# Patient Record
Sex: Male | Born: 1935
Health system: Southern US, Community
[De-identification: ages and names within clinical notes are randomized; demographics above are authoritative.]

## PROBLEM LIST (undated history)

## (undated) DIAGNOSIS — E291 Testicular hypofunction: Secondary | ICD-10-CM

## (undated) DIAGNOSIS — N529 Male erectile dysfunction, unspecified: Secondary | ICD-10-CM

## (undated) DIAGNOSIS — IMO0002 Reserved for concepts with insufficient information to code with codable children: Secondary | ICD-10-CM

## (undated) DIAGNOSIS — R9430 Abnormal result of cardiovascular function study, unspecified: Secondary | ICD-10-CM

## (undated) DIAGNOSIS — I2510 Atherosclerotic heart disease of native coronary artery without angina pectoris: Secondary | ICD-10-CM

## (undated) DIAGNOSIS — I739 Peripheral vascular disease, unspecified: Secondary | ICD-10-CM

## (undated) DIAGNOSIS — I10 Essential (primary) hypertension: Secondary | ICD-10-CM

## (undated) DIAGNOSIS — R0602 Shortness of breath: Secondary | ICD-10-CM

## (undated) DIAGNOSIS — J449 Chronic obstructive pulmonary disease, unspecified: Secondary | ICD-10-CM

## (undated) DIAGNOSIS — E785 Hyperlipidemia, unspecified: Secondary | ICD-10-CM

## (undated) DIAGNOSIS — K635 Polyp of colon: Secondary | ICD-10-CM

## (undated) HISTORY — DX: Abnormal result of cardiovascular function study, unspecified: R94.30

## (undated) HISTORY — DX: Testicular hypofunction: E29.1

## (undated) HISTORY — DX: Hyperlipidemia, unspecified: E78.5

## (undated) HISTORY — PX: APPENDECTOMY: SHX54

## (undated) HISTORY — DX: Essential (primary) hypertension: I10

## (undated) HISTORY — PX: TONSILLECTOMY: SUR1361

## (undated) HISTORY — DX: Peripheral vascular disease, unspecified: I73.9

## (undated) HISTORY — DX: Shortness of breath: R06.02

## (undated) HISTORY — DX: Atherosclerotic heart disease of native coronary artery without angina pectoris: I25.10

## (undated) HISTORY — DX: Male erectile dysfunction, unspecified: N52.9

## (undated) HISTORY — DX: Chronic obstructive pulmonary disease, unspecified: J44.9

## (undated) HISTORY — DX: Reserved for concepts with insufficient information to code with codable children: IMO0002

## (undated) HISTORY — DX: Polyp of colon: K63.5

---

## 2001-11-04 ENCOUNTER — Inpatient Hospital Stay (HOSPITAL_COMMUNITY): Admission: AD | Admit: 2001-11-04 | Discharge: 2001-11-07 | Payer: Self-pay | Admitting: Internal Medicine

## 2001-11-04 ENCOUNTER — Encounter: Payer: Self-pay | Admitting: Internal Medicine

## 2001-11-25 ENCOUNTER — Encounter (HOSPITAL_COMMUNITY): Admission: RE | Admit: 2001-11-25 | Discharge: 2002-02-23 | Payer: Self-pay | Admitting: Cardiology

## 2003-08-19 ENCOUNTER — Encounter (INDEPENDENT_AMBULATORY_CARE_PROVIDER_SITE_OTHER): Payer: Self-pay | Admitting: Specialist

## 2003-08-19 ENCOUNTER — Ambulatory Visit (HOSPITAL_COMMUNITY): Admission: RE | Admit: 2003-08-19 | Discharge: 2003-08-19 | Payer: Self-pay | Admitting: Gastroenterology

## 2003-09-27 ENCOUNTER — Encounter: Admission: RE | Admit: 2003-09-27 | Discharge: 2003-09-27 | Payer: Self-pay | Admitting: Internal Medicine

## 2003-12-11 ENCOUNTER — Emergency Department (HOSPITAL_COMMUNITY): Admission: EM | Admit: 2003-12-11 | Discharge: 2003-12-12 | Payer: Self-pay

## 2004-11-22 ENCOUNTER — Emergency Department (HOSPITAL_COMMUNITY): Admission: EM | Admit: 2004-11-22 | Discharge: 2004-11-22 | Payer: Self-pay | Admitting: Emergency Medicine

## 2005-03-31 ENCOUNTER — Emergency Department (HOSPITAL_COMMUNITY): Admission: EM | Admit: 2005-03-31 | Discharge: 2005-03-31 | Payer: Self-pay | Admitting: Emergency Medicine

## 2005-04-16 ENCOUNTER — Encounter: Admission: RE | Admit: 2005-04-16 | Discharge: 2005-04-16 | Payer: Self-pay | Admitting: Internal Medicine

## 2005-06-04 ENCOUNTER — Ambulatory Visit (HOSPITAL_COMMUNITY): Admission: RE | Admit: 2005-06-04 | Discharge: 2005-06-05 | Payer: Self-pay | Admitting: Cardiology

## 2005-12-11 ENCOUNTER — Emergency Department (HOSPITAL_COMMUNITY): Admission: EM | Admit: 2005-12-11 | Discharge: 2005-12-11 | Payer: Self-pay | Admitting: Emergency Medicine

## 2006-10-11 ENCOUNTER — Encounter: Admission: RE | Admit: 2006-10-11 | Discharge: 2006-10-11 | Payer: Self-pay | Admitting: Cardiology

## 2008-06-28 ENCOUNTER — Encounter: Admission: RE | Admit: 2008-06-28 | Discharge: 2008-06-28 | Payer: Self-pay | Admitting: Cardiology

## 2008-07-05 ENCOUNTER — Ambulatory Visit (HOSPITAL_COMMUNITY): Admission: RE | Admit: 2008-07-05 | Discharge: 2008-07-05 | Payer: Self-pay | Admitting: Cardiology

## 2009-09-17 LAB — HM COLONOSCOPY: HM Colonoscopy: NORMAL

## 2009-11-16 LAB — HM COLONOSCOPY

## 2010-03-17 ENCOUNTER — Ambulatory Visit: Payer: Self-pay | Admitting: Internal Medicine

## 2010-03-17 DIAGNOSIS — E1122 Type 2 diabetes mellitus with diabetic chronic kidney disease: Secondary | ICD-10-CM | POA: Insufficient documentation

## 2010-03-17 DIAGNOSIS — E119 Type 2 diabetes mellitus without complications: Secondary | ICD-10-CM | POA: Insufficient documentation

## 2010-03-17 DIAGNOSIS — I10 Essential (primary) hypertension: Secondary | ICD-10-CM | POA: Insufficient documentation

## 2010-03-17 DIAGNOSIS — E785 Hyperlipidemia, unspecified: Secondary | ICD-10-CM | POA: Insufficient documentation

## 2010-03-21 ENCOUNTER — Ambulatory Visit: Payer: Self-pay | Admitting: Internal Medicine

## 2010-03-21 ENCOUNTER — Telehealth (INDEPENDENT_AMBULATORY_CARE_PROVIDER_SITE_OTHER): Payer: Self-pay | Admitting: *Deleted

## 2010-03-23 LAB — CONVERTED CEMR LAB
BUN: 14 mg/dL (ref 6–23)
Basophils Absolute: 0 10*3/uL (ref 0.0–0.1)
Chloride: 106 meq/L (ref 96–112)
Creatinine, Ser: 1.3 mg/dL (ref 0.4–1.5)
Eosinophils Absolute: 0.2 10*3/uL (ref 0.0–0.7)
GFR calc non Af Amer: 73 mL/min (ref >60)
Glucose, Bld: 146 mg/dL — ABNORMAL HIGH (ref 70–99)
HCT: 48 % (ref 39.0–52.0)
Hemoglobin: 16.5 g/dL (ref 13.0–17.0)
MCHC: 34.4 g/dL (ref 30.0–36.0)
Microalb Creat Ratio: 0.9 mg/g (ref 0.0–30.0)
Microalb, Ur: 1.5 mg/dL (ref 0.0–1.9)
Monocytes Absolute: 0.9 10*3/uL (ref 0.1–1.0)
Neutro Abs: 4 10*3/uL (ref 1.4–7.7)
Platelets: 223 10*3/uL (ref 150.0–400.0)
Potassium: 4.1 meq/L (ref 3.5–5.1)
RDW: 14.7 % — ABNORMAL HIGH (ref 11.5–14.6)
Sodium: 140 meq/L (ref 135–145)
WBC: 8.4 10*3/uL (ref 4.5–10.5)

## 2010-04-03 ENCOUNTER — Telehealth (INDEPENDENT_AMBULATORY_CARE_PROVIDER_SITE_OTHER): Payer: Self-pay | Admitting: *Deleted

## 2010-04-08 ENCOUNTER — Emergency Department (HOSPITAL_COMMUNITY): Admission: EM | Admit: 2010-04-08 | Discharge: 2010-04-09 | Payer: Self-pay | Admitting: Emergency Medicine

## 2010-04-10 ENCOUNTER — Telehealth: Payer: Self-pay | Admitting: Family Medicine

## 2010-04-10 DIAGNOSIS — R58 Hemorrhage, not elsewhere classified: Secondary | ICD-10-CM | POA: Insufficient documentation

## 2010-04-12 ENCOUNTER — Encounter: Payer: Self-pay | Admitting: Internal Medicine

## 2010-05-02 ENCOUNTER — Encounter: Payer: Self-pay | Admitting: Internal Medicine

## 2010-05-05 ENCOUNTER — Ambulatory Visit: Payer: Self-pay | Admitting: Internal Medicine

## 2010-05-23 ENCOUNTER — Encounter: Payer: Self-pay | Admitting: Internal Medicine

## 2010-06-21 ENCOUNTER — Ambulatory Visit: Payer: Self-pay | Admitting: Internal Medicine

## 2010-06-21 DIAGNOSIS — E291 Testicular hypofunction: Secondary | ICD-10-CM | POA: Insufficient documentation

## 2010-06-22 ENCOUNTER — Ambulatory Visit: Payer: Self-pay | Admitting: Internal Medicine

## 2010-06-29 LAB — CONVERTED CEMR LAB
ALT: 27 units/L (ref 0–53)
AST: 24 units/L (ref 0–37)
Cholesterol: 142 mg/dL (ref 0–200)
HDL: 40.1 mg/dL (ref >39.00)
Hgb A1c MFr Bld: 7.2 % — ABNORMAL HIGH (ref 4.6–6.5)
LDL Cholesterol: 82 mg/dL (ref 0–99)
TSH: 1.81 microintl units/mL (ref 0.35–5.50)
Total CHOL/HDL Ratio: 4
VLDL: 19.8 mg/dL (ref 0.0–40.0)

## 2010-06-30 ENCOUNTER — Ambulatory Visit: Payer: Self-pay | Admitting: Internal Medicine

## 2010-07-03 ENCOUNTER — Telehealth: Payer: Self-pay | Admitting: Internal Medicine

## 2010-08-02 ENCOUNTER — Telehealth: Payer: Self-pay | Admitting: Internal Medicine

## 2010-08-16 ENCOUNTER — Encounter: Payer: Self-pay | Admitting: Internal Medicine

## 2010-08-23 ENCOUNTER — Telehealth: Payer: Self-pay | Admitting: Internal Medicine

## 2010-09-21 ENCOUNTER — Other Ambulatory Visit: Payer: Self-pay | Admitting: Internal Medicine

## 2010-09-21 ENCOUNTER — Ambulatory Visit
Admission: RE | Admit: 2010-09-21 | Discharge: 2010-09-21 | Payer: Self-pay | Source: Home / Self Care | Attending: Internal Medicine | Admitting: Internal Medicine

## 2010-09-21 ENCOUNTER — Encounter: Payer: Self-pay | Admitting: Internal Medicine

## 2010-09-21 DIAGNOSIS — J069 Acute upper respiratory infection, unspecified: Secondary | ICD-10-CM | POA: Insufficient documentation

## 2010-09-21 LAB — BASIC METABOLIC PANEL
BUN: 18 mg/dL (ref 6–23)
CO2: 25 mEq/L (ref 19–32)
Calcium: 8.8 mg/dL (ref 8.4–10.5)
Chloride: 103 mEq/L (ref 96–112)
Creatinine, Ser: 1.5 mg/dL (ref 0.4–1.5)
GFR: 61.42 mL/min (ref >60.00)
Glucose, Bld: 129 mg/dL — ABNORMAL HIGH (ref 70–99)
Potassium: 3.5 mEq/L (ref 3.5–5.1)
Sodium: 138 mEq/L (ref 135–145)

## 2010-09-21 LAB — ALT: ALT: 26 U/L (ref 0–53)

## 2010-09-21 LAB — AST: AST: 20 U/L (ref 0–37)

## 2010-09-22 ENCOUNTER — Telehealth: Payer: Self-pay | Admitting: Internal Medicine

## 2010-09-25 ENCOUNTER — Telehealth: Payer: Self-pay | Admitting: Internal Medicine

## 2010-10-04 ENCOUNTER — Ambulatory Visit
Admission: RE | Admit: 2010-10-04 | Discharge: 2010-10-04 | Payer: Self-pay | Source: Home / Self Care | Attending: Internal Medicine | Admitting: Internal Medicine

## 2010-10-04 ENCOUNTER — Other Ambulatory Visit: Payer: Self-pay | Admitting: Internal Medicine

## 2010-10-04 LAB — HEMOGLOBIN A1C: Hgb A1c MFr Bld: 7.1 % — ABNORMAL HIGH (ref 4.6–6.5)

## 2010-10-06 ENCOUNTER — Telehealth (INDEPENDENT_AMBULATORY_CARE_PROVIDER_SITE_OTHER): Payer: Self-pay | Admitting: *Deleted

## 2010-10-06 ENCOUNTER — Ambulatory Visit
Admission: RE | Admit: 2010-10-06 | Discharge: 2010-10-06 | Payer: Self-pay | Source: Home / Self Care | Attending: Internal Medicine | Admitting: Internal Medicine

## 2010-10-06 DIAGNOSIS — R51 Headache: Secondary | ICD-10-CM | POA: Insufficient documentation

## 2010-10-06 DIAGNOSIS — M546 Pain in thoracic spine: Secondary | ICD-10-CM | POA: Insufficient documentation

## 2010-10-17 NOTE — Consult Note (Signed)
Summary: neg w/u for gross hematuria---- [facility]  [facility]   Imported By: Lanelle Bal 06/01/2010 10:34:16  _____________________________________________________________________  External Attachment:    Type:   Image     Comment:   External Document

## 2010-10-17 NOTE — Progress Notes (Signed)
Summary: Med question--Metformin  Phone Note Call from Patient Call back at Home Phone [phone]   Summary of Call: Patient called to verify that his Metformin should be 1000 mg and to make [place] aware that vit d prescription needs to go to mail order. Meds were discussed with patient and he will continue as planned. Initial call taken by: Lucious Groves CMA,  July 03, 2010 12:28 PM    Prescriptions: VITAMIN D (ERGOCALCIFEROL) 50000 UNIT CAPS (ERGOCALCIFEROL) 1 by mouth every mon and thurs  #24 x 3   Entered by:   Lucious Groves CMA   Authorized by:   Nolon Rod. Paz MD   Signed by:   Lucious Groves CMA on 07/03/2010   Method used:   Faxed to ...       [facility])       [address] [address]       [place], [place]  [address]       Ph: [phone]       Fax: [phone]   RxID:   [id number] METFORMIN HCL 1000 MG TABS (METFORMIN HCL) 1 by mouth two times a day.  #180 x 2   Entered by:   Lucious Groves CMA   Authorized by:   Nolon Rod. Paz MD   Signed by:   Lucious Groves CMA on 07/03/2010   Method used:   Faxed to ...       [facility])       [address] [address]       [place], [place]  [address]       Ph: [phone]       Fax: [phone]   RxID:   [id number]

## 2010-10-17 NOTE — Letter (Signed)
Summary: Records Dated 10-09-07 thru 10-10-09/[facility]  Records Dated 10-09-07 thru 10-10-09/[facility]   Imported By: Lanelle Bal 03/23/2010 09:31:54  _____________________________________________________________________  External Attachment:    Type:   Image     Comment:   External Document  Appended Document: Records Dated 10-09-07 thru 10-10-09/[facility] scanned for future reference

## 2010-10-17 NOTE — Progress Notes (Signed)
Summary: Referral  Phone Note Call from Patient Call back at Home Phone [phone]   Caller: Patient Summary of Call: Patient went to the ER this weekend bc he was bleeding from his penis. They did not admit him but said he should call us for a referral to an urologist. Initial call taken by: Harold Barban,  April 10, 2010 9:22 AM  Follow-up for Phone Call        ok for referral. Follow-up by: Neena Rhymes MD,  April 10, 2010 9:24 AM  Additional Follow-up for Phone Call Additional follow up Details #1::        Will send information to [facility].  Additional Follow-up by: Harold Barban,  April 10, 2010 9:42 AM  New Problems: UNSPECIFIED HEMORRHAGE (ICD-459.0)   New Problems: UNSPECIFIED HEMORRHAGE (ICD-459.0)

## 2010-10-17 NOTE — Miscellaneous (Signed)
Summary: Flu/[facility]  Flu/[facility]   Imported By: Lanelle Bal 05/18/2010 10:59:47  _____________________________________________________________________  External Attachment:    Type:   Image     Comment:   External Document

## 2010-10-17 NOTE — Progress Notes (Signed)
Summary: three 90 day prescriptions  Phone Note Refill Request Call back at Home Phone [phone] Message from:  Patient on August 02, 2010 9:36 AM  Refills Requested: Medication #1:  COZAAR 100 MG TABS 1 by mouth daily  Medication #2:  NORVASC 10 MG TABS 1 by mouth once daily  Medication #3:  JANUVIA 100 MG TABS 1 by mouth daily. needs 90 day prescriptions plus refills from [facility]  Initial call taken by: Jerolyn Shin,  August 02, 2010 9:39 AM  Follow-up for Phone Call        Patient notified. Follow-up by: Lucious Groves CMA,  August 02, 2010 10:20 AM    Prescriptions: JANUVIA 100 MG TABS (SITAGLIPTIN PHOSPHATE) 1 by mouth daily.  #90 x 2   Entered by:   Lucious Groves CMA   Authorized by:   Nolon Rod. Paz MD   Signed by:   Lucious Groves CMA on 08/02/2010   Method used:   Faxed to ...       [facility] (mail-order)             , [place]         Ph:        Fax: [phone]   RxID:   [id number] NORVASC 10 MG TABS (AMLODIPINE BESYLATE) 1 by mouth once daily  #90 x 2   Entered by:   Lucious Groves CMA   Authorized by:   Nolon Rod. Paz MD   Signed by:   Lucious Groves CMA on 08/02/2010   Method used:   Faxed to ...       [facility] (mail-order)             , [place]         Ph:        Fax: [phone]   RxID:   [id number] COZAAR 100 MG TABS (LOSARTAN POTASSIUM) 1 by mouth daily  #90 x 2   Entered by:   Lucious Groves CMA   Authorized by:   Nolon Rod. Paz MD   Signed by:   Lucious Groves CMA on 08/02/2010   Method used:   Faxed to ...       [facility] (mail-order)             , [place]         Ph:        Fax: [phone]   RxID:   [id number]

## 2010-10-17 NOTE — Progress Notes (Signed)
Summary: refill  Phone Note Refill Request   Refills Requested: Medication #1:  METOPROLOL SUCCINATE 100 MG XR24H-TAB 1 by mouth daily  Medication #2:  METFORMIN HCL 500 MG TABS 1 by mouth once daily  Medication #3:  TRIAMTERENE-HCTZ 37.5-25 MG TABS 1 by mouth once daily  Medication #4:  ZOCOR 20 MG TABS 1 by mouth daily patient [facility]    Initial call taken by: Okey Regal [place],  March 21, 2010 8:48 AM    Prescriptions: ZOCOR 20 MG TABS (SIMVASTATIN) 1 by mouth daily  #90 x 0   Entered by:   Army Fossa CMA   Authorized by:   Nolon Rod. Paz MD   Signed by:   Army Fossa CMA on 03/21/2010   Method used:   Faxed to ...       [facility] education [facility])       [address] [address]       [place], [place]  [address]       Ph: [phone]       Fax: [phone]   RxID:   [id number] TRIAMTERENE-HCTZ 37.5-25 MG TABS (TRIAMTERENE-HCTZ) 1 by mouth once daily  #90 x 0   Entered by:   Army Fossa CMA   Authorized by:   Nolon Rod. Paz MD   Signed by:   Army Fossa CMA on 03/21/2010   Method used:   Faxed to ...       [facility] education [facility])       [address] [address]       [place], [place]  [address]       Ph: [phone]       Fax: [phone]   RxID:   [id number] METFORMIN HCL 500 MG TABS (METFORMIN HCL) 1 by mouth once daily  #90 x 0   Entered by:   Army Fossa CMA   Authorized by:   Nolon Rod. Paz MD   Signed by:   Army Fossa CMA on 03/21/2010   Method used:   Faxed to ...       [facility])       [address] [address]       [place], [place]  [address]       Ph: [phone]       Fax: [phone]   RxID:   [id number] METOPROLOL SUCCINATE 100 MG XR24H-TAB (METOPROLOL SUCCINATE) 1 by mouth daily  #90 x 0   Entered by:   Army Fossa CMA   Authorized by:   Nolon Rod. Paz MD   Signed by:   Army Fossa CMA on 03/21/2010   Method used:   Faxed to ...       [facility])       [address] [address]       [place], [place]   [address]       Ph: [phone]       Fax: [phone]   RxID:   [id number]

## 2010-10-17 NOTE — Progress Notes (Signed)
Summary: RX  Phone Note Refill Request Call back at [phone] Message from:  Patient  Refills Requested: Medication #1:  ANDROGEL PUMP 1.25 GM/ACT (1%) GEL Rx per cards.   Dosage confirmed as above?Dosage Confirmed   Supply Requested: 1 month PLEASE FAX TO [facility]   Initial call taken by: Freddy Jaksch,  August 23, 2010 11:38 AM  Follow-up for Phone Call        is cards still filling this for pt? Follow-up by: Army Fossa CMA,  August 23, 2010 11:45 AM  Additional Follow-up for Phone Call Additional follow up Details #1::        needs to discuss with the prescribing doctor Additional Follow-up by: [facility] E. Paz MD,  August 23, 2010 3:17 PM    Additional Follow-up for Phone Call Additional follow up Details #2::    Pts wife is aware. Army Fossa CMA  August 23, 2010 3:44 PM    Appended Document: neg diabetic retinopathy--- [facility] eneter eye exam on EMR

## 2010-10-17 NOTE — Assessment & Plan Note (Signed)
Summary: reaction to flu shot?//lch   Vital Signs:  Patient profile:   75 year old male Weight:      247 pounds Temp:     97.0 degrees F oral Pulse rate:   80 / minute Pulse rhythm:   regular BP sitting:   130 / 84  (left arm) Cuff size:   large  Vitals Entered By: Army Fossa CMA (May 05, 2010 12:56 PM) CC: Possible Flu Shot Reaction Comments - Coughing - Head congestion - Had flu shot tuesday   History of Present Illness: patient started with runny nose, dry cough but no fever 6 days ago 3 days ago he went to the pharmachy  to get over-the-counter medication, the pharmacist  suggested a flu shot The next day he felt although worse, more cough, body aches, anterior chest pain with cough, sneezing. Today he feels overall better.  ROS Denies fevers No rash No nausea, vomiting, diarrhea No facial or lip swelling  Current Medications (verified): 1)  Cozaar 100 Mg Tabs (Losartan Potassium) .Marland Kitchen.. 1 By Mouth Daily 2)  Vitamin D (Ergocalciferol) 50000 Unit Caps (Ergocalciferol) .Marland Kitchen.. 1 By Mouth Every Mon and Thurs 3)  Metoprolol Succinate 100 Mg Xr24h-Tab (Metoprolol Succinate) .Marland Kitchen.. 1 By Mouth Daily 4)  Metformin Hcl 850 Mg Tabs (Metformin Hcl) .Marland Kitchen.. 1 By Mouth Two Times A Day 5)  Norvasc 10 Mg Tabs (Amlodipine Besylate) .Marland Kitchen.. 1 By Mouth Once Daily 6)  Triamterene-Hctz 37.5-25 Mg Tabs (Triamterene-Hctz) .Marland Kitchen.. 1 By Mouth Once Daily 7)  Zocor 20 Mg Tabs (Simvastatin) .Marland Kitchen.. 1 By Mouth Daily 8)  Plavix 75 Mg Tabs (Clopidogrel Bisulfate) .Marland Kitchen.. 1 By Mouth Once Daily 9)  Aspirin 81 Mg Tbec (Aspirin) .Marland Kitchen.. 1 A Day 10)  Januvia 100 Mg Tabs (Sitagliptin Phosphate) .Marland Kitchen.. 1 By Mouth Daily.  Allergies (verified): No Known Drug Allergies  Past History:  Past Medical History: Reviewed history from 03/17/2010 and no changes required. Vascular, heart doctor was Dr Jacinto Halim, now is Dr Lynnea Ferrier  CAD  s/p stents 2006 PVD -- s/p stents at LE 2009 Polyps in colon, s/p several Cscopes , last 2011  w/ Dr Loreta Ave  Diabetes mellitus-- dx aprox 2009 Hyperlipidemia--dx in the 90s Hypertension-- dx in the 90     Past Surgical History: Reviewed history from 03/17/2010 and no changes required. Appendectomy Tonsillectomy  Physical Exam  General:  alert and well-developed.  no apparent distress Head:  face symmetric Nose:  note congested Mouth:  no redness or discharge Lungs:  normal respiratory effort, no intercostal retractions, no accessory muscle use, and normal breath sounds.   Heart:  normal rate, regular rhythm, and no murmur.   Extremities:  no edema   Impression & Recommendations:  Problem # 1:  ? of UPPER RESPIRATORY INFECTION, VIRAL (ICD-465.9) unclear if symptoms related to viral infection that is running its course or if his symptoms were exacerbated by the flu shot. He does not have clear-cut symptoms related to an allergic reaction Furthermore, he has taken flu shots for many years without problems Plan: Rest, fluids, Tylenol. See instructions Okay to take his flu shot next year His updated medication list for this problem includes:    Aspirin 81 Mg Tbec (Aspirin) .Marland Kitchen... 1 a day  Complete Medication List: 1)  Cozaar 100 Mg Tabs (Losartan potassium) .Marland Kitchen.. 1 by mouth daily 2)  Vitamin D (ergocalciferol) 50000 Unit Caps (Ergocalciferol) .Marland Kitchen.. 1 by mouth every mon and thurs 3)  Metoprolol Succinate 100 Mg Xr24h-tab (Metoprolol succinate) .Marland Kitchen.. 1 by mouth  daily 4)  Metformin Hcl 850 Mg Tabs (Metformin hcl) .Marland Kitchen.. 1 by mouth two times a day 5)  Norvasc 10 Mg Tabs (Amlodipine besylate) .Marland Kitchen.. 1 by mouth once daily 6)  Triamterene-hctz 37.5-25 Mg Tabs (Triamterene-hctz) .Marland Kitchen.. 1 by mouth once daily 7)  Zocor 20 Mg Tabs (Simvastatin) .Marland Kitchen.. 1 by mouth daily 8)  Plavix 75 Mg Tabs (Clopidogrel bisulfate) .Marland Kitchen.. 1 by mouth once daily 9)  Aspirin 81 Mg Tbec (Aspirin) .Marland Kitchen.. 1 a day 10)  Januvia 100 Mg Tabs (Sitagliptin phosphate) .Marland Kitchen.. 1 by mouth daily.  Patient Instructions: 1)  Get plenty  of rest, drink lots of clear liquids, and use Tylenol 2)  robitussin DM as needed for cough 3)  call if no better in few days, call any time if you get worse    Immunization History:  Influenza Immunization History:    Influenza:  historical (05/02/2010)

## 2010-10-17 NOTE — Assessment & Plan Note (Signed)
Summary: new to [email or web address]   Vital Signs:  Patient profile:   75 year old male Height:      74 inches Weight:      254 pounds BMI:     32.73 Pulse rate:   80 / minute Pulse rhythm:   regular BP sitting:   138 / 84  (left arm) Cuff size:   large  Vitals Entered By: Army Fossa CMA (March 17, 2010 1:24 PM) CC: Pt here to establish   History of Present Illness: new patient Here to establish, feels well  used see Dr Renae Gloss   Heart Disease -- last OV w/  cards 5-11, next visit 6-11 Diabetes-- ambulatory CBGs usually in the 116 Hyperlipidemia-- good medication compliance  Hypertension-- no recent ambulatory BPs but usually in the 130s/80s  Preventive Screening-Counseling & Management  Alcohol-Tobacco     Alcohol drinks/day: <1     Smoking Status: quit  Caffeine-Diet-Exercise     Does Patient Exercise: yes     Type of exercise: treadmill     Times/week: 3      Drug Use:  no.    Allergies (verified): No Known Drug Allergies  Past History:  Family History: Last updated: 03/17/2010 Heart Disease  Family History Diabetes 1st degree relative Family History High cholesterol Family History Hypertension Family History of Stroke M 1st degree relative <50  Social History: Last updated: 03/17/2010 Married children, 4 and 2 step sons Retired, he  preaches  Former Smoker, quit 1987 (used to smoke 2ppd) Alcohol use-yes-socially  Drug use-no Regular exercise-yes  Risk Factors: Alcohol Use: <1 (03/17/2010) Exercise: yes (03/17/2010)  Risk Factors: Smoking Status: quit (03/17/2010)  Past Medical History: Vascular, heart doctor was Dr Jacinto Halim, now is Dr Lynnea Ferrier  CAD  s/p stents 2006 PVD -- s/p stents at LE 2009 Polyps in colon, s/p several Cscopes , last 2011 w/ Dr Loreta Ave  Diabetes mellitus-- dx aprox 2009 Hyperlipidemia--dx in the 90s Hypertension-- dx in the 90     Past Surgical History: Appendectomy Tonsillectomy  Family History: Heart Disease  Family  History Diabetes 1st degree relative Family History High cholesterol Family History Hypertension Family History of Stroke M 1st degree relative <50  Social History: Married children, 4 and 2 step sons Retired, he  preaches  Former Smoker, quit 1987 (used to smoke 2ppd) Alcohol use-yes-socially  Drug use-no Regular exercise-yes Smoking Status:  quit Drug Use:  no Does Patient Exercise:  yes  Review of Systems General:  diet-- try to eat healthy, not always succesful  exercise-- trying to walk most weeks . CV:  Denies chest pain or discomfort and swelling of feet; no  claudication . Resp:  Denies cough; SOB at baseline. GI:  Denies bloody stools, nausea, and vomiting. GU:  Denies dysuria, hematuria, nocturia, and urinary hesitancy.  Physical Exam  General:  alert, well-developed, and well-nourished.   Neck:  no masses and normal carotid upstroke.   Lungs:  normal respiratory effort, no intercostal retractions, no accessory muscle use, and normal breath sounds.   Heart:  normal rate, regular rhythm, no murmur, and no gallop.   Abdomen:  soft, non-tender, no distention, no masses, no guarding, and no rigidity.   Extremities:  no pretibial edema bilaterally  Psych:  Cognition and judgment appear intact. Alert and cooperative with normal attention span and concentration.     Impression & Recommendations:  Problem # 1:  DIABETES MELLITUS, TYPE I (ICD-250.01) discussed with patient diet and exercise, labs His updated medication list  for this problem includes:    Cozaar 100 Mg Tabs (Losartan potassium) .Marland Kitchen... 1 by mouth daily    Metformin Hcl 500 Mg Tabs (Metformin hcl) .Marland Kitchen... 1 by mouth once daily    Aspirin 81 Mg Tbec (Aspirin) .Marland Kitchen... 1 a day  Orders: Venipuncture ([address]) TLB-A1C / Hgb A1C (Glycohemoglobin) (83036-A1C) TLB-Microalbumin/Creat Ratio, Urine (82043-MALB)  Problem # 2:  HYPERTENSION (ICD-401.9) seems to be well controlled labs  His updated medication list for  this problem includes:    Cozaar 100 Mg Tabs (Losartan potassium) .Marland Kitchen... 1 by mouth daily    Metoprolol Succinate 100 Mg Xr24h-tab (Metoprolol succinate) .Marland Kitchen... 1 by mouth daily    Norvasc 10 Mg Tabs (Amlodipine besylate) .Marland Kitchen... 1 by mouth once daily    Triamterene-hctz 37.5-25 Mg Tabs (Triamterene-hctz) .Marland Kitchen... 1 by mouth once daily  BP today: 138/84  Orders: TLB-BMP (Basic Metabolic Panel-BMET) (80048-METABOL) TLB-CBC Platelet - w/Differential (85025-CBCD)  Problem # 3:  PERIPHERAL VASCULAR DISEASE (ICD-443.9) chart reviewed, procedure in October 2009, see below. He seems to be stable. Followup by cardiology     PROCEDURE PERFORMED:  Abdominal aortogram.  Percutaneous transluminal angioplasty and balloon angioplasty of the left superficial femoral artery.  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      His updated medication list for this problem includes:    Plavix 75 Mg Tabs (Clopidogrel bisulfate) .Marland Kitchen... 1 by mouth once daily  Problem # 4:  CORONARY ARTERY DISEASE (ICD-414.00) saw  cardiology recently, was told to come back in one year seems stable His updated medication list for this problem includes:    Cozaar 100 Mg Tabs (Losartan potassium) .Marland Kitchen... 1 by mouth daily    Metoprolol Succinate 100 Mg Xr24h-tab (Metoprolol succinate) .Marland Kitchen... 1 by mouth daily    Norvasc 10 Mg Tabs (Amlodipine besylate) .Marland Kitchen... 1 by mouth once daily    Triamterene-hctz 37.5-25 Mg Tabs (Triamterene-hctz) .Marland Kitchen... 1 by mouth once daily    Plavix 75 Mg Tabs (Clopidogrel bisulfate) .Marland Kitchen... 1 by mouth once daily    Aspirin 81 Mg Tbec (Aspirin) .Marland Kitchen... 1 a day  Problem # 5:  HYPERLIPIDEMIA (ICD-272.4)  His updated medication list for this problem includes:    Zocor 20 Mg Tabs (Simvastatin) .Marland Kitchen... 1 by mouth daily  Complete Medication List: 1)  Cozaar 100 Mg Tabs (Losartan potassium) .Marland Kitchen.. 1 by mouth daily 2)   Vitamin D (ergocalciferol) 50000 Unit Caps (Ergocalciferol) .Marland Kitchen.. 1 by mouth every mon and thurs 3)  Metoprolol Succinate 100 Mg Xr24h-tab (Metoprolol succinate) .Marland Kitchen.. 1 by mouth daily 4)  Metformin Hcl 500 Mg Tabs (Metformin hcl) .Marland Kitchen.. 1 by mouth once daily 5)  Norvasc 10 Mg Tabs (Amlodipine besylate) .Marland Kitchen.. 1 by mouth once daily 6)  Triamterene-hctz 37.5-25 Mg Tabs (Triamterene-hctz) .Marland Kitchen.. 1 by mouth once daily 7)  Zocor 20 Mg Tabs (Simvastatin) .Marland Kitchen.. 1 by mouth daily 8)  Plavix 75 Mg Tabs (Clopidogrel bisulfate) .Marland Kitchen.. 1 by mouth once daily 9)  Aspirin 81 Mg Tbec (Aspirin) .Marland Kitchen.. 1 a day  Patient Instructions: 1)  Please schedule a follow-up appointment in 3 months .  2)  please get records from Dr. Loreta Ave 3)  Please get records from Dr. Lynnea Ferrier   Risk Factors:  Tobacco use:  quit Drug use:  no Alcohol use:  yes    Drinks per day:  <1 Exercise:  yes    Times per week:  3    Type:  treadmill  Colonoscopy  History:     Date of Last Colonoscopy:  09/17/2009    Results:  normal     Preventive Care Screening  Colonoscopy:    Date:  09/17/2009    Results:  normal   Last Tetanus Booster:    Date:  09/17/2004    Results:  Historical

## 2010-10-17 NOTE — Assessment & Plan Note (Signed)
Summary: CPX.///SPH   Vital Signs:  Patient profile:   75 year old male Height:      74 inches Weight:      255.13 pounds Pulse rate:   72 / minute Pulse rhythm:   regular BP sitting:   122 / 82  (left arm) Cuff size:   large  Vitals Entered By: Army Fossa CMA (June 21, 2010 2:28 PM) CC: CPX, not fasting Comments Pharm- [facility] Had flu shot @ [facility]   History of Present Illness: Here for Medicare AWV: 1.   Risk factors based on Past M, S, F history:reviewed 2.   Physical Activities: active, no routine exercise , occasionally does walk in the treadmil 3.   Depression/mood: denies problems, no problems noted today  4.   Hearing: no problems noted today, denies issues himself  5.   ADL's: totally independent  6.   Fall Risk: low risk, no h/o falls 7.   Home Safety: does feelsafe at home  8.   Height, weight, &visual acuity: see VS, vision corrected w/ glasses  9.   Counseling: yes, see below 10.   Labs ordered based on risk factors: yes 11.           Referral Coordination, if needed 12.           Care Plan, see assessment and plan 13.            Cognitive Assessment, motor skills, memory and cognition seem appropriate  additionally, we discussed the following issues  CAD -- saw cards last 6 months ago, next visit?  Diabetes mellitus-- ambulatory CBGs in the 109 and always < 120   Hyperlipidemia-- good medication compliance   Hypertension-- good medication compliance , no ambulatory BPs   reports he uses tetosterone as Rx by cardiology   Preventive Screening-Counseling & Management  Caffeine-Diet-Exercise     Does Patient Exercise: no  Current Medications (verified): 1)  Cozaar 100 Mg Tabs (Losartan Potassium) .Marland Kitchen.. 1 By Mouth Daily 2)  Vitamin D (Ergocalciferol) 50000 Unit Caps (Ergocalciferol) .Marland Kitchen.. 1 By Mouth Every Mon and Thurs 3)  Metoprolol Succinate 100 Mg Xr24h-Tab (Metoprolol Succinate) .Marland Kitchen.. 1 By Mouth Daily 4)  Metformin Hcl 850 Mg Tabs  (Metformin Hcl) .Marland Kitchen.. 1 By Mouth Two Times A Day 5)  Norvasc 10 Mg Tabs (Amlodipine Besylate) .Marland Kitchen.. 1 By Mouth Once Daily 6)  Triamterene-Hctz 37.5-25 Mg Tabs (Triamterene-Hctz) .Marland Kitchen.. 1 By Mouth Once Daily 7)  Zocor 20 Mg Tabs (Simvastatin) .Marland Kitchen.. 1 By Mouth Daily 8)  Plavix 75 Mg Tabs (Clopidogrel Bisulfate) .Marland Kitchen.. 1 By Mouth Once Daily 9)  Aspirin 81 Mg Tbec (Aspirin) .Marland Kitchen.. 1 A Day 10)  Januvia 100 Mg Tabs (Sitagliptin Phosphate) .Marland Kitchen.. 1 By Mouth Daily.  Allergies (verified): No Known Drug Allergies  Past History:  Past Medical History: Vascular, heart doctor was Dr Jacinto Halim, now is Dr Lynnea Ferrier  CAD  s/p stents 2006 PVD -- s/p stents at LE 2009 Polyps in colon, s/p several Cscopes , last 2011 w/ Dr Loreta Ave  Diabetes mellitus-- dx aprox 2009 Hyperlipidemia--dx in the 90s Hypertension-- dx in the 90  ED -- has use a vacumm device on testosterone Rx by Cardiology per patient   Past Surgical History: Reviewed history from 03/17/2010 and no changes required. Appendectomy Tonsillectomy  Family History: Heart Disease -- F  Diabetes --  GM, nephews, many family members  High cholesterol-- ? Hypertension-- F  stroke -- F  colon ca--no prostate ca-- no  Social History: Married children, 4 and 2 step sons Retired, he  preaches  Former Smoker, quit 1987 (used to smoke 2ppd) Alcohol use-yes-socially  Drug use-no Regular exercise-- no but active  Does Patient Exercise:  no  Review of Systems General:  Denies fatigue and fever. CV:  Denies chest pain or discomfort and swelling of feet. Resp:  Denies cough and shortness of breath. GI:  Denies bloody stools, nausea, and vomiting. GU:  Denies dysuria, urinary frequency, and urinary hesitancy. Neuro:  Denies numbness; no HAs.  Physical Exam  General:  alert, well-developed, and overweight-appearing.   Neck:  no masses, no thyromegaly, and normal carotid upstroke.   Lungs:  normal respiratory effort, no intercostal retractions, no  accessory muscle use, and normal breath sounds.   Heart:  normal rate, regular rhythm, and no murmur.   Abdomen:  soft, non-tender, no distention, no masses, no guarding, and no rigidity.  no bruit Prostate:  performed a urology July 2011 Pulses:  normal bilateral pedal pulses Extremities:  no edema  Diabetes Management Exam:    Foot Exam (with socks and/or shoes not present):       Sensory-Pinprick/Light touch:          Left medial foot (L-4): normal          Left dorsal foot (L-5): normal          Left lateral foot (S-1): normal          Right medial foot (L-4): normal          Right dorsal foot (L-5): normal          Right lateral foot (S-1): normal       Sensory-Monofilament:          Left foot: diminished          Right foot: diminished       Sensory-other: very mild decrease sensory at the tip  of the fourth toe bilaterally       Inspection:          Left foot: normal          Right foot: normal       Nails:          Left foot: thickened          Right foot: thickened   Impression & Recommendations:  Problem # 1:  ROUTINE GENERAL MEDICAL EXAM@HEALTH  CARE FACL (ICD-V70.0)  Td 06 Pneumonia shot , had one before many years ago, no documentation ----> pneumonia shot provided today Had a flu shot already printed material provided regards shingles immunization  Colonoscopy-- h/o polyps in colon, s/p several Cscopes , last 2011 w/ Dr Loreta Ave  (per patient)  DRE done @ urology recently Check PSA discussed diet and exercise  Orders: Medicare -1st Annual Wellness Visit [phone])  Problem # 2:  HYPOGONADISM (ICD-257.2) on AndroGel according to the patient. Problem managed by cardiology  Problem # 3:  CORONARY ARTERY DISEASE (ICD-414.00) asymptomatic, saw  cardiology 6 months ago, recommend to call them and find out when his  next appointment is supposed to be His updated medication list for this problem includes:    Aspirin 81 Mg Tbec (Aspirin) .Marland Kitchen... 1 a day    Cozaar 100  Mg Tabs (Losartan potassium) .Marland Kitchen... 1 by mouth daily    Metoprolol Succinate 100 Mg Xr24h-tab (Metoprolol succinate) .Marland Kitchen... 1 by mouth daily    Norvasc 10 Mg Tabs (Amlodipine besylate) .Marland Kitchen... 1 by mouth once daily    Triamterene-hctz  37.5-25 Mg Tabs (Triamterene-hctz) .Marland Kitchen... 1 by mouth once daily    Plavix 75 Mg Tabs (Clopidogrel bisulfate) .Marland Kitchen... 1 by mouth once daily  Problem # 4:  HYPERLIPIDEMIA (ICD-272.4) labs His updated medication list for this problem includes:    Zocor 20 Mg Tabs (Simvastatin) .Marland Kitchen... 1 by mouth daily  Problem # 5:  HYPERTENSION (ICD-401.9) at goal  His updated medication list for this problem includes:    Cozaar 100 Mg Tabs (Losartan potassium) .Marland Kitchen... 1 by mouth daily    Metoprolol Succinate 100 Mg Xr24h-tab (Metoprolol succinate) .Marland Kitchen... 1 by mouth daily    Norvasc 10 Mg Tabs (Amlodipine besylate) .Marland Kitchen... 1 by mouth once daily    Triamterene-hctz 37.5-25 Mg Tabs (Triamterene-hctz) .Marland Kitchen... 1 by mouth once daily  BP today: 122/82 Prior BP: 130/84 (05/05/2010)  Labs Reviewed: K+: 4.1 (03/21/2010) Creat: : 1.3 (03/21/2010)     Problem # 6:  DIABETES MELLITUS, TYPE I (ICD-250.01) we increased his medication based on the last A1c----- labs He does have mild peripheral neuropathy,information provider regards feet care Also recommend to see an eye doctor routinely His updated medication list for this problem includes:    Aspirin 81 Mg Tbec (Aspirin) .Marland Kitchen... 1 a day    Cozaar 100 Mg Tabs (Losartan potassium) .Marland Kitchen... 1 by mouth daily    Metformin Hcl 850 Mg Tabs (Metformin hcl) .Marland Kitchen... 1 by mouth two times a day    Januvia 100 Mg Tabs (Sitagliptin phosphate) .Marland Kitchen... 1 by mouth daily.  Labs Reviewed: Creat: 1.3 (03/21/2010)    Reviewed HgBA1c results: 7.8 (03/21/2010)  Complete Medication List: 1)  Aspirin 81 Mg Tbec (Aspirin) .Marland Kitchen.. 1 a day 2)  Vitamin D (ergocalciferol) 50000 Unit Caps (Ergocalciferol) .Marland Kitchen.. 1 by mouth every mon and thurs 3)  Cozaar 100 Mg Tabs (Losartan  potassium) .Marland Kitchen.. 1 by mouth daily 4)  Metoprolol Succinate 100 Mg Xr24h-tab (Metoprolol succinate) .Marland Kitchen.. 1 by mouth daily 5)  Norvasc 10 Mg Tabs (Amlodipine besylate) .Marland Kitchen.. 1 by mouth once daily 6)  Metformin Hcl 850 Mg Tabs (Metformin hcl) .Marland Kitchen.. 1 by mouth two times a day 7)  Triamterene-hctz 37.5-25 Mg Tabs (Triamterene-hctz) .Marland Kitchen.. 1 by mouth once daily 8)  Zocor 20 Mg Tabs (Simvastatin) .Marland Kitchen.. 1 by mouth daily 9)  Plavix 75 Mg Tabs (Clopidogrel bisulfate) .Marland Kitchen.. 1 by mouth once daily 10)  Januvia 100 Mg Tabs (Sitagliptin phosphate) .Marland Kitchen.. 1 by mouth daily. 11)  Androgel Pump 1.25 Gm/act (1%) Gel (Testosterone) .... Rx per cards  Other Orders: Pneumococcal Vaccine (45409) Admin 1st Vaccine (81191)  Patient Instructions: 1)  please come back fasting 2)  FLP TSH-----dx  high cholesterol 3)  Hemoglobin A1c, AST, ALT----dx   diabetes 4)  PSA---- dx  screening for prostate cancer 5)  BMP----dx hypertension 6)  Please schedule a follow-up appointment in 4 to 5  months .    Immunizations Administered:  Pneumonia Vaccine:    Vaccine Type: Pneumovax    Site: left deltoid    Mfr: Merck    Dose: 0.5 ml    Route: IM    Given by: Army Fossa CMA    Exp. Date: 11/30/2011    Lot #: [id number]      Risk Factors:  Exercise:  no

## 2010-10-17 NOTE — Consult Note (Signed)
Summary: hematuria workup-- [facility]   Imported By: Lanelle Bal 04/21/2010 10:21:31  _____________________________________________________________________  External Attachment:    Type:   Image     Comment:   External Document

## 2010-10-17 NOTE — Progress Notes (Signed)
Summary: rx's   Phone Note Call from Patient   Summary of Call: I spoke with pt and gave him his labs results. He [facility] Will send in.     Prescriptions: JANUVIA 100 MG TABS (SITAGLIPTIN PHOSPHATE) 1 by mouth daily.  #90 x 0   Entered by:   Army Fossa CMA   Authorized by:   Nolon Rod. Paz MD   Signed by:   Army Fossa CMA on 04/03/2010   Method used:   Faxed to ...       [facility]             , [place]         Ph:        Fax: [phone]   RxID:   [id number] METFORMIN HCL 850 MG TABS (METFORMIN HCL) 1 by mouth two times a day  #180 x 0   Entered by:   Army Fossa CMA   Authorized by:   Nolon Rod. Paz MD   Signed by:   Army Fossa CMA on 04/03/2010   Method used:   Faxed to ...       [facility] (mail[facility])             , [place]         Ph:        Fax: [phone]   RxID:   [id number]

## 2010-10-19 NOTE — Assessment & Plan Note (Signed)
Summary: 3 MONTH OV//PH   Vital Signs:  Patient profile:   75 year old male Weight:      256.25 pounds Pulse rate:   76 / minute Pulse rhythm:   regular BP sitting:   138 / 84  (left arm) Cuff size:   large  Vitals Entered By: Army Fossa CMA (September 21, 2010 7:56 AM) CC: 3 month f/u- Fasting  Comments [facility]    History of Present Illness: ROV --developed  back pain while  visiting [facility], went to the ER over there. Back pain improving. --as far as the diabetes, we increased the metformin, patient is tolerating it well. Ambulatory CBGs varies from 90-120. No side effects that he can tell --as far as her cardiovascular disease, he will see Dr. Lynnea Ferrier  this afternoon. --developing a cold in the last 24 hours, reports cough and some chest congestion... see ROS   Current Medications (verified): 1)  Aspirin 81 Mg Tbec (Aspirin) .Marland Kitchen.. 1 A Day 2)  Vitamin D (Ergocalciferol) 50000 Unit Caps (Ergocalciferol) .Marland Kitchen.. 1 By Mouth Every Mon and Thurs 3)  Cozaar 100 Mg Tabs (Losartan Potassium) .Marland Kitchen.. 1 By Mouth Daily 4)  Metoprolol Succinate 100 Mg Xr24h-Tab (Metoprolol Succinate) .Marland Kitchen.. 1 By Mouth Daily 5)  Norvasc 10 Mg Tabs (Amlodipine Besylate) .Marland Kitchen.. 1 By Mouth Once Daily 6)  Metformin Hcl 1000 Mg Tabs (Metformin Hcl) .Marland Kitchen.. 1 By Mouth Two Times A Day. 7)  Triamterene-Hctz 37.5-25 Mg Tabs (Triamterene-Hctz) .Marland Kitchen.. 1 By Mouth Once Daily 8)  Zocor 20 Mg Tabs (Simvastatin) .Marland Kitchen.. 1 By Mouth Daily 9)  Plavix 75 Mg Tabs (Clopidogrel Bisulfate) .Marland Kitchen.. 1 By Mouth Once Daily 10)  Januvia 100 Mg Tabs (Sitagliptin Phosphate) .Marland Kitchen.. 1 By Mouth Daily. 11)  Androgel Pump 1.25 Gm/act (1%) Gel (Testosterone) .... Rx Per Cards  Allergies (verified): No Known Drug Allergies  Past History:  Past Medical History: Reviewed history from 06/21/2010 and no changes required. Vascular, heart doctor was Dr Jacinto Halim, now is Dr Lynnea Ferrier  CAD  s/p stents 2006 PVD -- s/p stents at LE 2009 Polyps in colon,  s/p several Cscopes , last 2011 w/ Dr Loreta Ave  Diabetes mellitus-- dx aprox 2009 Hyperlipidemia--dx in the 90s Hypertension-- dx in the 90  ED -- has use a vacumm device on testosterone Rx by Cardiology per patient   Past Surgical History: Reviewed history from 03/17/2010 and no changes required. Appendectomy Tonsillectomy  Social History: Reviewed history from 06/21/2010 and no changes required. Married children, 4 and 2 step sons Retired, he  preaches  Former Smoker, quit 1987 (used to smoke 2ppd) Alcohol use-yes-socially  Drug use-no Regular exercise-- no but active   Review of Systems General:  no fever or chills he did get a flu shot. ENT:  Mild sinus congestion. Mild sore throat. CV:  Denies chest pain or discomfort; no shortness of breath. GI:  no nausea, vomiting, diarrhea.  Physical Exam  General:  alert and well-developed.   Ears:  R ear normal and L ear normal.   Nose:  slightly congested Mouth:  no discharge, mild redness Lungs:  normal respiratory effort, no intercostal retractions, no accessory muscle use, and normal breath sounds.   Heart:  normal rate, regular rhythm, and no murmur.   Extremities:  no edema   Impression & Recommendations:  Problem # 1:  URI (ICD-465.9) Assessment New see instructions His updated medication list for this problem includes:    Aspirin 81 Mg Tbec (Aspirin) .Marland Kitchen... 1 a day  Problem # 2:  HYPOGONADISM (ICD-257.2) per cardiology  Problem # 3:  CORONARY ARTERY DISEASE (ICD-414.00) asymptomatic His updated medication list for this problem includes:    Aspirin 81 Mg Tbec (Aspirin) .Marland Kitchen... 1 a day    Cozaar 100 Mg Tabs (Losartan potassium) .Marland Kitchen... 1 by mouth daily    Metoprolol Succinate 100 Mg Xr24h-tab (Metoprolol succinate) .Marland Kitchen... 1 by mouth daily    Norvasc 10 Mg Tabs (Amlodipine besylate) .Marland Kitchen... 1 by mouth once daily    Triamterene-hctz 37.5-25 Mg Tabs (Triamterene-hctz) .Marland Kitchen... 1 by mouth once daily    Plavix 75 Mg Tabs  (Clopidogrel bisulfate) .Marland Kitchen... 1 by mouth once daily  Problem # 4:  DIABETES MELLITUS, TYPE I (ICD-250.01) based on the last A1c, we increased  the metformin dose. Patient has no side effects. Labs His updated medication list for this problem includes:    Aspirin 81 Mg Tbec (Aspirin) .Marland Kitchen... 1 a day    Cozaar 100 Mg Tabs (Losartan potassium) .Marland Kitchen... 1 by mouth daily    Metformin Hcl 1000 Mg Tabs (Metformin hcl) .Marland Kitchen... 1 by mouth two times a day.    Januvia 100 Mg Tabs (Sitagliptin phosphate) .Marland Kitchen... 1 by mouth daily.  Labs Reviewed: Creat: 1.3 (03/21/2010)    Reviewed HgBA1c results: 7.2 (06/22/2010)  7.8 (03/21/2010)  Orders: TLB-BMP (Basic Metabolic Panel-BMET) (80048-METABOL) Specimen Handling (16109)  Problem # 5:  HYPERTENSION (ICD-401.9) at goal  His updated medication list for this problem includes:    Cozaar 100 Mg Tabs (Losartan potassium) .Marland Kitchen... 1 by mouth daily    Metoprolol Succinate 100 Mg Xr24h-tab (Metoprolol succinate) .Marland Kitchen... 1 by mouth daily    Norvasc 10 Mg Tabs (Amlodipine besylate) .Marland Kitchen... 1 by mouth once daily    Triamterene-hctz 37.5-25 Mg Tabs (Triamterene-hctz) .Marland Kitchen... 1 by mouth once daily  BP today: 138/84 Prior BP: 122/82 (06/21/2010)  Labs Reviewed: K+: 4.1 (03/21/2010) Creat: : 1.3 (03/21/2010)   Chol: 142 (06/22/2010)   HDL: 40.10 (06/22/2010)   LDL: 82 (06/22/2010)   TG: 99.0 (06/22/2010)  Complete Medication List: 1)  Aspirin 81 Mg Tbec (Aspirin) .Marland Kitchen.. 1 a day 2)  Vitamin D (ergocalciferol) 50000 Unit Caps (Ergocalciferol) .Marland Kitchen.. 1 by mouth every mon and thurs 3)  Cozaar 100 Mg Tabs (Losartan potassium) .Marland Kitchen.. 1 by mouth daily 4)  Metoprolol Succinate 100 Mg Xr24h-tab (Metoprolol succinate) .Marland Kitchen.. 1 by mouth daily 5)  Norvasc 10 Mg Tabs (Amlodipine besylate) .Marland Kitchen.. 1 by mouth once daily 6)  Metformin Hcl 1000 Mg Tabs (Metformin hcl) .Marland Kitchen.. 1 by mouth two times a day. 7)  Januvia 100 Mg Tabs (Sitagliptin phosphate) .Marland Kitchen.. 1 by mouth daily. 8)  Triamterene-hctz 37.5-25 Mg  Tabs (Triamterene-hctz) .Marland Kitchen.. 1 by mouth once daily 9)  Zocor 20 Mg Tabs (Simvastatin) .Marland Kitchen.. 1 by mouth daily 10)  Plavix 75 Mg Tabs (Clopidogrel bisulfate) .Marland Kitchen.. 1 by mouth once daily 11)  Androgel Pump 1.25 Gm/act (1%) Gel (Testosterone) .... Rx per cards  Other Orders: Venipuncture (60454) TLB-ALT (SGPT) (84460-ALT) TLB-AST (SGOT) (84450-SGOT)  Patient Instructions: 1)  for the cold: Rest, fluids, Tylenol if needed. Robitussin-DM as needed for cough and congestion. Use saline nasal spray to clear her nose. 2)  Call if you aren't better in a few days, call any time if you  get worse 3)  we are checking a hemoglobin A1c, BMP and LFTs today 4)  Please schedule a follow-up appointment in 4 months .    Orders Added: 1)  Venipuncture [36415] 2)  TLB-BMP (Basic Metabolic Panel-BMET) [80048-METABOL] 3)  TLB-ALT (SGPT) [84460-ALT] 4)  TLB-AST (SGOT) [84450-SGOT] 5)  Specimen Handling [99000] 6)  Est. Patient Level IV [82956]

## 2010-10-19 NOTE — Letter (Signed)
Summary: neg diabetic retinopathy--- [facility]  [facility]   Imported By: Lanelle Bal 08/24/2010 14:28:51  _____________________________________________________________________  External Attachment:    Type:   Image     Comment:   External Document  Appended Document: neg diabetic retinopathy--- [facility] eneter eye exam on EMR

## 2010-10-19 NOTE — Assessment & Plan Note (Signed)
Summary: HA/drb   Vital Signs:  Patient profile:   75 year old male Weight:      253.2 pounds BMI:     32.63 Temp:     97.5 degrees F oral Pulse rate:   76 / minute Resp:     16 per minute BP sitting:   128 / 76  (left arm) Cuff size:   large  Vitals Entered By: Shonna Chock CMA (October 06, 2010 10:35 AM) CC: In the middle of the night patient with pain in the front and back of head. Patient also experienced right shoulder pain, Headaches   CC:  In the middle of the night patient with pain in the front and back of head. Patient also experienced right shoulder pain and Headaches.  History of Present Illness:    Onset last night 10:30 pm  while  reclining  in bed , watching tv. Acute onset  in occiput  with upper back pain then radiation to frontal area. The patient denies nausea, vomiting, sweats, tearing of eyes, nasal congestion, sinus pain, sinus pressure ( except with recent URI), photophobia, and phonophobia.  The headache is described as sharp and pressure-like.    High-risk features (red flags) include age >50 years and anticoagulation use(Plavix).  The patient denies the following high-risk features: fever, neck pain/stiffness, vision loss or change, focal weakness, and pain worse with exertion.  Rx: none . The headache had resolved this am  after he took a nap in chair waiting for this appt. No PMH of migraines.  Current Medications (verified): 1)  Aspirin 81 Mg Tbec (Aspirin) .Marland Kitchen.. 1 A Day 2)  Vitamin D (Ergocalciferol) 50000 Unit Caps (Ergocalciferol) .Marland Kitchen.. 1 By Mouth Every Mon and Thurs 3)  Cozaar 100 Mg Tabs (Losartan Potassium) .Marland Kitchen.. 1 By Mouth Daily 4)  Metoprolol Succinate 100 Mg Xr24h-Tab (Metoprolol Succinate) .Marland Kitchen.. 1 By Mouth Daily 5)  Norvasc 10 Mg Tabs (Amlodipine Besylate) .Marland Kitchen.. 1 By Mouth Once Daily 6)  Metformin Hcl 1000 Mg Tabs (Metformin Hcl) .Marland Kitchen.. 1 By Mouth Two Times A Day. 7)  Januvia 100 Mg Tabs (Sitagliptin Phosphate) .Marland Kitchen.. 1 By Mouth Daily. 8)  Triamterene-Hctz  37.5-25 Mg Tabs (Triamterene-Hctz) .Marland Kitchen.. 1 By Mouth Once Daily 9)  Zocor 20 Mg Tabs (Simvastatin) .Marland Kitchen.. 1 By Mouth Daily 10)  Plavix 75 Mg Tabs (Clopidogrel Bisulfate) .Marland Kitchen.. 1 By Mouth Once Daily 11)  Androgel Pump 1.25 Gm/act (1%) Gel (Testosterone) .... Rx Per Cards 12)  Amaryl 2 Mg Tabs (Glimepiride) .Marland Kitchen.. 1 By Mouth Once Daily.  Allergies (verified): No Known Drug Allergies  Review of Systems Eyes:  Denies blurring, double vision, and vision loss-both eyes. ENT:  Denies decreased hearing, earache, and ringing in ears. Neuro:  Denies brief paralysis, disturbances in coordination, falling down, numbness, poor balance, and tingling.  Physical Exam  General:  well-nourished,in no acute distress; alert,appropriate and cooperative throughout examination Eyes:  No corneal or conjunctival inflammation noted. EOMI. Perrla. Field of Vision grossly normal. Arcus  Ears:  External ear exam shows no significant lesions or deformities.  Otoscopic examination reveals clear canals, tympanic membranes are intact bilaterally without bulging, retraction, inflammation or discharge. Hearing is grossly normal bilaterally. Nose:  External nasal examination shows no deformity or inflammation. Nasal mucosa are pink and moist without lesions or exudates. Mouth:  Oral mucosa and oropharynx without lesions or exudates.  Teeth in good repair. No tongue deviation Neck:  No deformities, masses, or tenderness noted. Flexion causes back pain Lungs:  Normal respiratory effort, chest  expands symmetrically. Lungs are clear to auscultation, no crackles or wheezes. Heart:  Normal rate and regular rhythm. S1 and S2 normal without gallop, murmur, click, rub or other extra sounds. Pulses:  R and L carotid  pulses are full and equal bilaterally, no bruits Neurologic:  alert & oriented X3, cranial nerves II-XII intact, strength normal in all extremities, sensation intact to light touch, gait normal, DTRs symmetrical and  1/2+,  finger-to-nose normal, and Romberg negative.     Impression & Recommendations:  Problem # 1:  HEADACHE (ICD-784.0) neg neuro exam His updated medication list for this problem includes:    Aspirin 81 Mg Tbec (Aspirin) .Marland Kitchen... 1 a day    Metoprolol Succinate 100 Mg Xr24h-tab (Metoprolol succinate) .Marland Kitchen... 1 by mouth daily  Problem # 2:  BACK PAIN, THORACIC REGION (ICD-724.1)  upper back , probably  from prolonged neck fexion watching tv His updated medication list for this problem includes:    Aspirin 81 Mg Tbec (Aspirin) .Marland Kitchen... 1 a day    Cyclobenzaprine Hcl 5 Mg Tabs (Cyclobenzaprine hcl) .Marland Kitchen... 1 two times a day & at bedtime as needed  Orders: Prescription Created Electronically [id number])  Problem # 3:  HYPERTENSION (ICD-401.9) controlled His updated medication list for this problem includes:    Cozaar 100 Mg Tabs (Losartan potassium) .Marland Kitchen... 1 by mouth daily    Metoprolol Succinate 100 Mg Xr24h-tab (Metoprolol succinate) .Marland Kitchen... 1 by mouth daily    Norvasc 10 Mg Tabs (Amlodipine besylate) .Marland Kitchen... 1 by mouth once daily    Triamterene-hctz 37.5-25 Mg Tabs (Triamterene-hctz) .Marland Kitchen... 1 by mouth once daily  Complete Medication List: 1)  Aspirin 81 Mg Tbec (Aspirin) .Marland Kitchen.. 1 a day 2)  Vitamin D (ergocalciferol) 50000 Unit Caps (Ergocalciferol) .Marland Kitchen.. 1 by mouth every mon and thurs 3)  Cozaar 100 Mg Tabs (Losartan potassium) .Marland Kitchen.. 1 by mouth daily 4)  Metoprolol Succinate 100 Mg Xr24h-tab (Metoprolol succinate) .Marland Kitchen.. 1 by mouth daily 5)  Norvasc 10 Mg Tabs (Amlodipine besylate) .Marland Kitchen.. 1 by mouth once daily 6)  Metformin Hcl 1000 Mg Tabs (Metformin hcl) .Marland Kitchen.. 1 by mouth two times a day. 7)  Januvia 100 Mg Tabs (Sitagliptin phosphate) .Marland Kitchen.. 1 by mouth daily. 8)  Triamterene-hctz 37.5-25 Mg Tabs (Triamterene-hctz) .Marland Kitchen.. 1 by mouth once daily 9)  Zocor 20 Mg Tabs (Simvastatin) .Marland Kitchen.. 1 by mouth daily 10)  Plavix 75 Mg Tabs (Clopidogrel bisulfate) .Marland Kitchen.. 1 by mouth once daily 11)  Androgel Pump 1.25 Gm/act (1%) Gel  (Testosterone) .... Rx per cards 12)  Amaryl 2 Mg Tabs (Glimepiride) .Marland Kitchen.. 1 by mouth once daily. 13)  Cyclobenzaprine Hcl 5 Mg Tabs (Cyclobenzaprine hcl) .Marland Kitchen.. 1 two times a day & at bedtime as needed  Patient Instructions: 1)  Use heating pad OR Zostrix cream to upper back three times a day as needed for pain.Avoid prolonged neck flexion as discussed Prescriptions: CYCLOBENZAPRINE HCL 5 MG TABS (CYCLOBENZAPRINE HCL) 1 two times a day & at bedtime as needed  #15 x 0   Entered and Authorized by:   Marga Melnick MD   Signed by:   Marga Melnick MD on 10/06/2010   Method used:   Electronically to        [facility] [phone]* (retail)       [address]       [place], [place]  [address]       Ph: [phone]       Fax: [phone]   RxID:   [id number]  Orders Added: 1)  Est. Patient Level IV [16109] 2)  Prescription Created Electronically [phone]

## 2010-10-19 NOTE — Letter (Signed)
Summary: stable, no change---[facility] & V[facility]  [facility]   Imported By: Maryln Gottron 10/11/2010 15:40:28  _____________________________________________________________________  External Attachment:    Type:   Image     Comment:   External Document

## 2010-10-19 NOTE — Progress Notes (Signed)
Summary: transfer care of hypogonadysm  to [place]  Phone Note Call from Patient Call back at Home Phone [phone]   Caller: Patient Summary of Call: Patient called to let you know he saw Dr. Nilda Simmer yesterday and he is going to stop treating him for the androgel. He would be faxing some information to you reguarding this.  He recieved a rx for the androgel yesterday and will fill that with [facility] but would like [place] to refill it from now on. Initial call taken by: Harold Barban,  September 22, 2010 1:44 PM  Follow-up for Phone Call        I definitely need to see the previous blood work Follow-up by: Nolon Rod. Antonique Langford MD,  September 22, 2010 5:13 PM

## 2010-10-19 NOTE — Progress Notes (Signed)
Summary: labs   I spoke w/ pts wife pt is out of town all week. She will have him call us when he gets back into town to schedule appt. Army Fossa CMA  September 25, 2010 1:38 PM    ---- Converted from flag ---- ---- 09/24/2010 2:17 PM, Delois Silvester E. Miachel Nardelli MD wrote: please redraw , tell patient  i apologize, i forgot to order  ---- 09/22/2010 7:55 AM, Floydene Flock wrote: sorry, that takes a different tube.  ) :  ---- 09/22/2010 7:53 AM, Army Fossa CMA wrote:   ---- 09/21/2010 7:42 PM, Maurissa Ambrose E. Kima Malenfant MD wrote: i meant to check a A1C, could we add? ------------------------------

## 2010-10-19 NOTE — Progress Notes (Signed)
Summary: Rx Question   Phone Note Call from Patient Call back at Home Phone [phone]   Caller: Patient Summary of Call: Mr Carnero would like you to call him. He wanted to discuss the most recent medication Dr. Drue Novel added to his list. He says that he didnt really understand what it was for. Initial call taken by: Lavell Islam,  October 06, 2010 11:35 AM  Follow-up for Phone Call        left message for pt to call back. Army Fossa CMA  October 06, 2010 11:47 AM   Additional Follow-up for Phone Call Additional follow up Details #1::        discussed w/ pt that his medication was for his DM. Additional Follow-up by: Army Fossa CMA,  October 09, 2010 4:38 PM

## 2010-12-02 ENCOUNTER — Encounter: Payer: Self-pay | Admitting: Internal Medicine

## 2010-12-02 LAB — URINALYSIS, ROUTINE W REFLEX MICROSCOPIC
Bilirubin Urine: NEGATIVE
Leukocytes, UA: NEGATIVE
Protein, ur: NEGATIVE mg/dL
Specific Gravity, Urine: 1.019 (ref 1.005–1.030)
Urobilinogen, UA: 1 mg/dL (ref 0.0–1.0)
pH: 5.5 (ref 5.0–8.0)

## 2010-12-02 LAB — URINE CULTURE

## 2010-12-02 LAB — URINE MICROSCOPIC-ADD ON

## 2010-12-07 ENCOUNTER — Other Ambulatory Visit: Payer: Self-pay | Admitting: *Deleted

## 2010-12-07 MED ORDER — METOPROLOL SUCCINATE ER 100 MG PO TB24: 100.0000 mg | ORAL_TABLET | Freq: Every day | ORAL | Status: DC

## 2010-12-07 NOTE — Telephone Encounter (Signed)
Pt called needed a refill on Toprol.

## 2010-12-28 ENCOUNTER — Telehealth: Payer: Self-pay | Admitting: Internal Medicine

## 2010-12-28 NOTE — Telephone Encounter (Signed)
Per cardiology 

## 2010-12-28 NOTE — Telephone Encounter (Signed)
Is this rx'd by cards or you? If you please advise on refill?

## 2010-12-28 NOTE — Telephone Encounter (Signed)
Patient needs prescription refill for Androgel called into [facility]

## 2010-12-29 NOTE — Telephone Encounter (Addendum)
Spoke w/ pt says that this was supposed to be transferred to Dr. Drue Novel to start prescribing and looking at phone note 10/02/10 last labs was requested. Called [facility] and [facility] to be faxed.

## 2011-01-03 ENCOUNTER — Other Ambulatory Visit: Payer: Self-pay | Admitting: *Deleted

## 2011-01-03 NOTE — Telephone Encounter (Signed)
Notes from cardiology reviewed, PSA was 0.22 June 2010. Ok RF Androgel , same as strength and dose as before. Call a six-month supply. Patient is to be seen here for followup in a month or 2   [facility]

## 2011-01-03 NOTE — Telephone Encounter (Signed)
Spoke w/ pt aware of information.  

## 2011-01-03 NOTE — Telephone Encounter (Signed)
Addended by: Doristine Devoid on: 01/03/2011 05:02 PM   Modules accepted: Orders

## 2011-01-03 NOTE — Telephone Encounter (Signed)
Please review office visit and labs from cardiology pt needs refill on Androgel and see previous phone note from 10/02/10

## 2011-01-04 MED ORDER — TESTOSTERONE 12.5 MG/ACT (1%) TD GEL: 1.0000 | Freq: Every day | TRANSDERMAL | Status: DC

## 2011-01-08 ENCOUNTER — Encounter: Payer: Self-pay | Admitting: Internal Medicine

## 2011-01-15 ENCOUNTER — Telehealth: Payer: Self-pay | Admitting: Internal Medicine

## 2011-01-15 MED ORDER — TESTOSTERONE 12.5 MG/ACT (1%) TD GEL: 1.0000 | Freq: Every day | TRANSDERMAL | Status: DC

## 2011-01-15 NOTE — Telephone Encounter (Signed)
[facility].

## 2011-01-15 NOTE — Telephone Encounter (Signed)
Patient called to tell us that he had received letter from [facility] stating that the prescription for Testosterone-Androgel needs name, directions and Doctors Signature---please resend

## 2011-01-19 ENCOUNTER — Encounter: Payer: Self-pay | Admitting: Internal Medicine

## 2011-01-19 ENCOUNTER — Ambulatory Visit (INDEPENDENT_AMBULATORY_CARE_PROVIDER_SITE_OTHER): Payer: Medicare Other | Admitting: Internal Medicine

## 2011-01-19 DIAGNOSIS — E291 Testicular hypofunction: Secondary | ICD-10-CM

## 2011-01-19 DIAGNOSIS — E109 Type 1 diabetes mellitus without complications: Secondary | ICD-10-CM

## 2011-01-19 DIAGNOSIS — I10 Essential (primary) hypertension: Secondary | ICD-10-CM

## 2011-01-19 DIAGNOSIS — I2510 Atherosclerotic heart disease of native coronary artery without angina pectoris: Secondary | ICD-10-CM

## 2011-01-19 LAB — BASIC METABOLIC PANEL
CO2: 25 mEq/L (ref 19–32)
Chloride: 103 mEq/L (ref 96–112)
Creatinine, Ser: 1.4 mg/dL (ref 0.4–1.5)
GFR: 63.38 mL/min (ref >60.00)
Sodium: 139 mEq/L (ref 135–145)

## 2011-01-19 MED ORDER — CLOPIDOGREL BISULFATE 75 MG PO TABS: 75.0000 mg | ORAL_TABLET | Freq: Every day | ORAL | Status: DC

## 2011-01-19 MED ORDER — AMLODIPINE BESYLATE 10 MG PO TABS: 10.0000 mg | ORAL_TABLET | Freq: Every day | ORAL | Status: DC

## 2011-01-19 MED ORDER — LOSARTAN POTASSIUM 100 MG PO TABS: 100.0000 mg | ORAL_TABLET | Freq: Every day | ORAL | Status: DC

## 2011-01-19 MED ORDER — SITAGLIPTIN PHOSPHATE 100 MG PO TABS: 100.0000 mg | ORAL_TABLET | Freq: Every day | ORAL | Status: DC

## 2011-01-19 NOTE — Assessment & Plan Note (Signed)
Well-controlled. Last creatinine 1.5, slightly above baseline. Labs

## 2011-01-19 NOTE — Assessment & Plan Note (Addendum)
Has been on AndroGel for a while, cardiology used to Rx it  . He requested I do his prescriptions. I will try to get records with previous testosterone levels. Per cardiology notes, he was taking AndroGel 1% 4 pumps daily.

## 2011-01-19 NOTE — Progress Notes (Signed)
  Subjective:    Patient ID: Jonathan [name], male    DOB: [birth year], 75 y.o.   MRN: [id number]  HPI Routine office visit Complaints of wheezing on and off for a while. He is a former smoker. See review of systems. Symptoms are not seasonal or spring related. Hypertension, needs refill on his medications, wonders if he needs all the medication that he's taking. Heart disease, asymptomatic, see review of systems.  Past Medical History  Diagnosis Date  . CAD (coronary artery disease)     s/p stents 2006, Dr Lynnea Ferrier  . PVD (peripheral vascular disease)     s/p stents at LE 2009  . Colon polyps     s/p several Cscopes.  . Diabetes mellitus     dx aprox 2009  . Hyperlipidemia     dx in 90s  . Hypertension     dx in the 90  . ED (erectile dysfunction)     has a vacumm device  . Hypogonadism male       Review of Systems Denies chest pain, mild shortness of breath from time to time, at baseline. Denies nausea, vomiting, diarrhea. No blood in the stools. Denies cough or sputum production.    Objective:   Physical Exam  Constitutional: He is oriented to person, place, and time. He appears well-developed and well-nourished.  Cardiovascular: Normal rate, regular rhythm and normal heart sounds.  Exam reveals no gallop.   No murmur heard. Pulmonary/Chest: Effort normal and breath sounds normal. No respiratory distress. He has no wheezes. He has no rales. He exhibits no tenderness.  Musculoskeletal: He exhibits no edema.  Neurological: He is alert and oriented to person, place, and time.  Psychiatric: He has a normal mood and affect. His behavior is normal. Judgment and thought content normal.        Assessment & Plan:

## 2011-01-19 NOTE — Assessment & Plan Note (Signed)
Last hemoglobin A1c was 7.1. We added Amaryl. Labs

## 2011-01-19 NOTE — Assessment & Plan Note (Signed)
Asymptomatic. 

## 2011-01-29 ENCOUNTER — Telehealth: Payer: Self-pay | Admitting: Internal Medicine

## 2011-01-29 MED ORDER — TESTOSTERONE 12.5 MG/ACT (1%) TD GEL: 1.0000 | Freq: Every day | TRANSDERMAL | Status: DC

## 2011-01-29 NOTE — Telephone Encounter (Signed)
[facility] and[facility] gel - but instruction didn't specify pump - please resend patient uses pump

## 2011-01-30 NOTE — Cardiovascular Report (Signed)
Jonathan [name], Jonathan [name]               ACCOUNT NO.:  [id number]   MEDICAL RECORD NO.:  [id number]          PATIENT TYPE:  AMB   LOCATION:  SDS                          FACILITY:  [facility]   PHYSICIAN:  Vonna Kotyk R. Jacinto Halim, MD       DATE OF BIRTH:  12-15-1935   DATE OF PROCEDURE:  07/05/2008  DATE OF DISCHARGE:  07/05/2008                            CARDIAC CATHETERIZATION   PROCEDURE PERFORMED:  1. Abdominal aortogram.  2. Crossover from the right femoral artery into the left femoral      artery and left femoral arteriogram with distal runoff.  3. Percutaneous transluminal angioplasty and balloon angioplasty of      the left superficial femoral artery.  4. Percutaneous transluminal angioplasty and stenting of the left      superficial femoral artery for failed balloon angioplasty secondary      to dissection.   INDICATIONS:  Mr. Ahmad Vanwey is a 75 year old gentleman with a  history of hypertension, diabetes, and known coronary artery disease.  He had been complaining of lifestyle-limiting claudication in his left  leg.  We had known about this peripheral arterial disease for more than  a year to 2 years, but because of increasing symptoms of claudication  and very high-grade velocities across the left SFA, which indicated that  it was pre-occlusive.  He was brought to the peripheral angiography  suite to evaluate his peripheral anatomy with an eye towards  revascularization.   Abdominal aortogram:  Abdominal aortogram revealed presence of 2 renal  arteries on either side, they were widely patent.  The mesenteric and  celiac trunks were widely patent.  There was no evidence abdominal  aneurysm.  The aortoiliac bifurcation was widely patent.  Bilaterally,  the iliac arteries were widely patent.   Left femoral arteriogram with distal runoff:  The left femoral  arteriogram with distal runoff revealed a high-grade stenosis of the  left SFA, which included a 90% stenosis and this was a long  segment  stenosis and arose from the midsegment of the Hunter canal to the outlet  of the Hunter canal.   There was three-vessel runoff noted below the left knee.   INTERVENTION DATA:  Successful PTA and balloon angioplasty of the left  SFA with utilization of a 6.0 x 80 mm FoxCross balloon.  Following this  angioplasty, there was a dissection noted at the high-grade stenosis.  Hence, this was stented with 8.0 x 100 mm Smart self-expanding stent and  postdilated again with the same balloon at 8 atmospheres of pressure x2.  Overall, the stenosis was reduced from 90% to 0% with brisk flow noted  with maintenance of three-vessel runoff below the left knee.   RECOMMENDATIONS:  His right femoral arterial access site was closed with  StarClose.  He will be discharged home today with outpatient followup  with one of our extenders for groin check in about a week.  He will  follow up Dr. Elsie Lincoln in about 2-3 months.  I suspect that he will have  significant improvement in his left leg claudication.   A total of 106  mL of contrast was utilized for diagnostic and  interventional procedure.   TECHNIQUE OF PROCEDURE:  Under usual sterile precaution using a 5-French  right femoral artery access, a 5-French Omni flush catheter was advanced  through the abdominal aorta and abdominal aortogram was performed.  Pelvic aortogram was also performed.  The same catheter was utilized to  crossover from the right leg into the left leg and the catheter was  placed into the left femoral artery and angiography was performed.   Right using heparin for anticoagulation maintaining ACT of greater than  200, a 45-cm Terumo crossover sheath was utilized.  A 7-French Terumo  sheath was utilized to crossover from the right femoral artery and the  tip of the catheter was placed into the left femoral artery.  Left  femoral arteriogram was performed.  The lesion length was carefully  measured with a ruler.  Then, the  lesion was dilated with a 6.0 x 80 mm  balloon and because of dissection, a self-expanding 8.0 x 100 mm Smart  stent was deployed with excellent results and postdilated with the same  balloon and performed this angiography, revealed excellent results.  Wire was carefully withdrawn.  The Terumo sheath was gently withdrawn  back into the right external iliac artery and right femoral arteriogram  was performed and arterial access was closed with StarClose with  excellent hemostasis.  The patient tolerated the procedure well.  There  were no immediate complication noted.      Cristy Hilts. Jacinto Halim, MD  Electronically Signed     JRG/MEDQ  D:  07/05/2008  T:  07/06/2008  Job:  [id number]   cc:   Merlene Laughter. Renae Gloss, M.D.  Madaline Savage, M.D.

## 2011-02-02 NOTE — Discharge Summary (Signed)
NAMEROSS, BENDER NO.:  [id number]   MEDICAL RECORD NO.:  [id number]          PATIENT TYPE:  OIB   LOCATION:  6523                         FACILITY:  [facility]   PHYSICIAN:  Madaline Savage, M.D.DATE OF BIRTH:  1935/11/30   DATE OF ADMISSION:  06/04/2005  DATE OF DISCHARGE:  06/05/2005                                 DISCHARGE SUMMARY   DISCHARGE DIAGNOSIS:  1.  Coronary artery disease status post coronary angiography this admission      for abnormal Cardiolite stress test.  2.  Status post coronary intervention during this admission.  3.  Diabetes mellitus.  4.  Dyslipidemia.   HISTORY OF PRESENT ILLNESS:  This is a 75 year old African American  gentleman who has a prior history of coronary artery disease, diabetes  mellitus, and dyslipidemia, who was seen in our office by Dr. Elsie Lincoln at the  end of July because of complaints of shortness of breath and chest  tightness.  He underwent Cardiolite chest test which revealed abnormal  results, some mild ischemia in the inferolateral region.  Dr. Elsie Lincoln  performed outpatient cath at the [facility] that revealed a new  lesion of the proximal portion RCA and the patient was scheduled for  intervention at [facility].   HOSPITAL COURSE:  Placement of coronary stent to the proximal portion of the  right coronary artery was performed on June 04, 2005, by Dr. Elsie Lincoln.  His two other stents in the mid and distal RCA were patent.  The patient  tolerated the procedure well, was given Angiomax and Plavix, and was  transferred to the unit in stable condition.  The next morning, he was  assessed by Dr. Allyson Sabal, his groin was stable without any signs of ecchymosis  or hematoma.  He ambulated without difficulty, his vital signs remained  stable, and he was discharged home.   DISCHARGE MEDICATIONS:  Aspirin 81 mg daily, Plavix 75 mg daily, HCTZ 50 mg  daily, Lisinopril 20 mg daily, Toprol XL 25 mg daily,  Zocor 20 mg daily,  nitroglycerin 0.4 mg p.r.n.   DISCHARGE INSTRUCTIONS:  Low salt, low fat, low cholesterol diet.  Activities are no driving, no lifting greater than 5 pounds, no strenuous  activity the first three days post cath.  He is allowed to shower but  instructed not to bathe until groin heals completely.  Dr. Elsie Lincoln will see  the patient on October 4 at 2:15 in our office and phone number was provided  in case the patient has any questions or complaints.      Raymon Mutton, P.A.    ______________________________  Madaline Savage, M.D.    MK/MEDQ  D:  06/05/2005  T:  06/05/2005  Job:  [id number]

## 2011-02-02 NOTE — Discharge Summary (Signed)
[place]  Patient:    Jonathan [name], Jonathan [name] Visit Number: [id number] MRN: [id number]          Service Type: MED Location: [phone] Attending Physician:  Ophelia Shoulder Dictated by:   Abelino Derrick, P.A.C. Admit Date:  11/04/2001 Discharge Date: 11/07/2001   CC:         Madaline Savage, M.D.  Merlene Laughter. Renae Gloss, M.D.   Discharge Summary  DISCHARGE DIAGNOSES: 1. Subendocardial myocardial infarction this admission, treated with two site    right coronary artery intervention. 2. Hypertension, controlled. 3. Hyperlipidemia.  HOSPITAL COURSE:  The patient is a 75 year old male followed by Dr. Renae Gloss who is new to Dr. Elsie Lincoln.  He was admitted by Dr. Renae Gloss with chest pain inconsistent with unstable angina.  The patient was seen in consultation by Dr. Elsie Lincoln 11/05/01.   CKs were positive with CK of 219, MB 6.3.  Troponin levels went to 1.25.  Electrocardiogram showed sinus rhythm with T wave inversion in III and aVF.  The patient was treated with heparin, nitrates, oxygen and aspirin.  Beta blocker was added.  He was taken to the catheterization laboratory on 11/05/01 where catheterization revealed 95% proximal and mid right coronary artery lesion and 90% ______ branch.  He underwent stenting to the mid right coronary artery lesion and the ______ branch.  He did have 40% narrowing in the proximal left anterior descending. His ejection fraction was 55%.  He tolerated the procedure well and was stable postoperatively.  The patient was kept for an extra 24 hours as his enzymes were positive.  We feel he can be discharged 11/07/01.  He will follow up with Dr. Elsie Lincoln November 24, 2001 at 11:45 a.m. and will follow up with Dr. Renae Gloss.  DISCHARGE MEDICATIONS: 1. Plavix 75 mg q.d. for four weeks. 2. Aspirin 81 mg q.d. 3. Protonix 40 mg q.d. while on Plavix. 4. Hydrochlorothiazide 25 mg q.d. 5. Lisinopril 20 mg q.d. 6. Toprol XL 25 mg  q.d. 7. Nitroglycerin sublingual p.r.n.  LABORATORY DATA: CBC:  White blood cell count 10.7, hemoglobin 16, hematocrit 47.6, platelet count 242K.  CARDIAC ENZYMES:  Troponin peaked at 1.97.  ELECTROLYTES:  Sodium 136, potassium 3.9, BUN 16, creatinine 1.2.  LIPID PROFILE:  Total cholesterol 179, HDL 40, LDL 115, triglycerides 118.  INR 1.0.  LIVER FUNCTION TESTS:  Normal.  CHEST X-RAY:  No active disease.  DISPOSITION:  Patient is discharged in stable condition.  He has been instructed to go on a low cholesterol diet.  He will need follow up lipids as an outpatient in six to eight weeks. Dictated by:   Abelino Derrick, P.A.C. Attending Physician:  Ophelia Shoulder DD:  11/07/01 TD:  11/08/01 Job: [id number]

## 2011-02-02 NOTE — Cardiovascular Report (Signed)
[place]  Patient:    Jonathan [name], ZUKOWSKI Visit Number: [id number] MRN: [id number]          Service Type: Attending:  Madaline Savage, M.D. Dictated by:   Madaline Savage, M.D. Proc. Date: 11/05/01   CC:         Merlene Laughter. Renae Gloss, M.D.  [facility], [address] [address]  Cardiac Catheterization Lab   Cardiac Catheterization  PROCEDURES PERFORMED: 1. Selective coronary angiography by Judkins technique. 2. Retrograde left heart catheterization. 3. Left ventricular angiography. 4. Intracoronary artery balloon angioplasty and stenting of the mid right    coronary artery. 5. Intracoronary artery balloon angioplasty and stenting of the ostium of the    posterolateral branch of the right coronary artery.  COMPLICATIONS:  None.  ENTRY SITE:  Right femoral.  DYE USED:  Omnipaque.  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  Chest pain was gone shortly after emergency room arrival and the patient was hospitalized by his primary care physician, Dr. Kellie Shropshire.  Today his EKG showed some ST segment abnormalities in the inferior leads and cardiac enzymes showed elevated troponin levels with only trace positive CK-MBs.  The patient was apprised of the risks and the benefits of the aforementioned procedures and we proceeded with inpatient cardiac catheterization electively.  We gave him six hours since he ate breakfast to perform the procedure.  No nausea, vomiting, or other complications occurred during the procedure.  This procedure was performed on an inpatient basis.  RESULTS:  PRESSURES: 1. The left ventricular pressure was 85/10. 2. Central aortic pressure was 85/60. 3. No aortic valve gradient by pullback technique.  ANGIOGRAPHIC RESULTS: 1. The left  main coronary artery contained a 20% stenosis in the mid portion    of the vessel which was not considered flow-limiting.  2. The left circumflex coronary artery was normal.  It was nondominant.  3. The LAD consisted of an LAD that coursed to the cardiac apex and one large    optional diagonal that bifurcated distally.  The LAD contained a 40% lesion    in the proximal portion of the vessel near the origin of the optional    diagonal branch.  The distal vessel looked normal.  The distal optional    diagonal branch looked normal.  4. The right coronary artery was large and dominant and had a shepherds    crook deformity.  The vessel had a 50% stenosis in the proximal portion    of the vessel near the pulmonary conus takeoff.  The mid vessel near the    acute marginal takeoff showed a 95% concentric stenosis which was    considered the infarct-related vessel.  The distal RCA and the posterior    descending branches were normal; however, the ostium of the posterolateral    branch contained a 90% lesion that was concentric and approximately 8-10 mm    in length.  LEFT VENTRICULOGRAPHY:  RAO left ventriculography showed a moderate inferior wall hypokinesis consistent with the clinical diagnosis of acute inferior infarction, non-Q-wave in type, and the ejection fraction was 55%.  The left anterior oblique left ventriculogram failed to show any evidence of posterolateral wall hypokinesis.  Ejection fraction there, likewise, looked 55-60%.  PERCUTANEOUS INTERVENTION:  This was initially tried with a Judkins right  guide catheter with sideholes.  This allowed adequate backup support.  It did allow for a percutaneous balloon angioplasty of the mid right coronary artery but would not allow any intervention on the PLA lesion so I switched to a #7 [place] hockeystick guide catheter with sideholes which did provide better backup in the catheter and produced no damping of pressure.  The guidewire  used was a short Patriot wire which was an excellent choice.  It navigated not only the RCA proximal but also the posterolateral distal.  The balloon used for the mid RCA was a 2.5-mm Maverick balloon and likewise the same balloon for the ostial PLA.  Both inflations were to approximately six atmospheres of pressure which yielded better lumens for both vessels.  The mid RCA was then stented with a 3.0- x 16-mm Express 2 stent taken to 16 atmospheres of pressure corresponding to an expected lumen diameter of 3.3. The 95% lesion was reduced to 0% residual and TIMI-3 class flow increased from 2 to 3.  The distal lesion in the ostium of the PLA was then stented with a 2.5- x 12-mm Express 2 stent and approximately 12-13 atmospheres of pressure were used to inflate this 2.5- x 12-mm Express 2 stent.  That lesion was reduced from 90% to 0% residual and the TIMI flow was 3 when we concluded.  No complications occurred.  The patient tolerated the procedure very well and the patient was transferred from the catheterization lab to the holding area with stable vital signs, stable rhythm, and was alert and talkative.  FINAL DIAGNOSES: 1. Multivessel coronary disease    A. A 95% mid right coronary artery stenosis which was the culprit vessel       for the infarct.    B. A 90% ostial posterolateral branch stenosis.    C. A 20% left main stenosis.    D. A 40% left anterior descending artery proximal stenosis.    E. A 50% proximal right coronary artery stenosis. 2. Inferior wall hypokinesis, mild degree.  Ejection fraction 55%. 3. Successful stenting of the mid right coronary artery stenosis from 95% to    0%. 4. Successful stenting of the ostial posterolateral artery stenosis of the    distal right coronary artery from 90% to 0%.  RECOMMENDATIONS:  The patient will be treated with Integrilin for another 24  hours.  He will be discharged in two to three more days from the hospital and recuperate  and will be recommended to the Phase II cardiac rehabilitation program as treatment for his acute non-Q-wave myocardial infarction.  He will be on Plavix for one month and aspirin for a lifetime.  We will check his lipids. Dictated by:   Madaline Savage, M.D. Attending:  Madaline Savage, M.D. DD:  11/05/01 TD:  11/05/01 Job: [id number]

## 2011-02-02 NOTE — Op Note (Signed)
NAME:  SERVANDO, KYLLONEN                      ACCOUNT NO.:  [id number]   MEDICAL RECORD NO.:  [id number]                   PATIENT TYPE:  AMB   LOCATION:  ENDO                                 FACILITY:  [facility]   PHYSICIAN:  Anselmo Rod, M.D.               DATE OF BIRTH:  09-15-1936   DATE OF PROCEDURE:  08/19/2003  DATE OF DISCHARGE:                                 OPERATIVE REPORT   PROCEDURE PERFORMED:  Colonoscopy with cold biopsies times four.   ENDOSCOPIST:  Charna Elizabeth, M.D.   INSTRUMENT USED:  Olympus video colonoscope.   INDICATIONS FOR PROCEDURE:  The patient is a 75 year old African-American  male with guaiac positive stool.  Rule out colonic polyps, masses, etc.   PREPROCEDURE PREPARATION:  Informed consent was procured from the patient.  The patient was fasted for eight hours prior to the procedure and prepped  with a bottle of magnesium citrate and a gallon of GoLYTELY the night prior  to the procedure.  His aspirin and Plavix was discontinued for three days  prior to procedure as recommended by the patient's cardiologist by Madaline Savage, M.D.   PREPROCEDURE PHYSICAL:  The patient had stable vital signs.  Neck supple.  Chest clear to auscultation.  S1 and S2 regular.  Abdomen soft with normal  bowel sounds.   DESCRIPTION OF PROCEDURE:  The patient was placed in left lateral decubitus  position and sedated with 70 mg of Demerol and 7 mg of Versed in slow  incremental doses.  Once the patient was adequately sedated and maintained  on low flow oxygen and continuous cardiac monitoring, the Olympus video  colonoscope was advanced from the rectum to the cecum with extreme  difficulty.  Because of a very poor prep multiple washes were done.  The  patient's position was changed from the left lateral to the supine to the  right lateral position with gentle application of abdominal pressure to  reach the cecal base.  A small sessile polyp was biopsied from the  midright  colon.  Three small sessile polyps were biopsied from the rectum.  The  appendicular orifice and ileocecal valve were clearly visualized and  photographed.  Retroflexion in the rectum revealed small internal  hemorrhoids.  There was evidence of pandiverticulosis with diverticula in  various stages of formation throughout the colon.  The terminal ileum was  not visualized.   IMPRESSION:  1. Pandiverticulosis.  2. One small sessile polyp biopsied from mid right colon.  Three small     sessile polyps biopsied from the rectum.  Large amount of residual stool     in the colon, small lesions could have been missed.   RECOMMENDATIONS:  1. Await pathology results.  2. Hold off on aspirin and Plavix for the next 48 hours and resume the use     of these medications.  3. A high fiber diet with liberal fluid intake.  4. Outpatient follow-up in the next four weeks for further recommendation.                                               Anselmo Rod, M.D.    JNM/MEDQ  D:  08/19/2003  T:  08/20/2003  Job:  [id number]   cc:   Merlene Laughter. Renae Gloss, M.D.  [address]  [address]  [place]  [place] [address]  Fax: [phone]   Madaline Savage, M.D.  [phone], [address]  [place]  [place] [address]  Fax: [phone]

## 2011-02-02 NOTE — H&P (Signed)
[place]  Patient:    Jonathan [name], Jonathan [name] Visit Number: [id number] MRN: [id number]          Service Type: Attending:  Merlene Laughter. Renae Gloss, M.D. Dictated by:   Merlene Laughter Renae Gloss, M.D. Adm. Date:  11/04/01                           History and Physical  CHIEF COMPLAINT: Chest pain.  HISTORY OF PRESENT ILLNESS: Jonathan [name] is a 75 year old gentleman, with a history of hypertension, who presents with a four day history of nocturnal chest pain.  He states that the pain lasts for several minutes but resolves. He states the pain is mid sternal and is nonradiating, and is not associated with shortness of breath, diaphoresis, nausea, or vomiting.  EKG obtained in the office, however, showed T wave inversion in II, III, and aVF.  He is admitted for further evaluation of chest pain which is highly suspicious for a cardiac etiology.  ALLERGIES: NKDA.  MEDICATIONS:  1. Lisinopril 20 mg one p.o. q.d.  2. Hydrochlorothiazide 25 mg one p.o. q.d.  3. Norvasc 5 mg one p.o. q.d.  PAST MEDICAL HISTORY:  1. Hypertension.  2. Hyperlipidemia.  FAMILY HISTORY: Noncontributory.  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 He denies tobacco or drug abuse.  He does have occasional alcohol use.  REVIEW OF SYSTEMS: As her HPI, and the patients health history has been otherwise unremarkable.  Greater than ten systems were reviewed.  PHYSICAL EXAMINATION:  GENERAL APPEARANCE: Well-developed, well-nourished black male in no acute distress.  VITAL SIGNS: Temperature 97.3 degrees, heart rate 86, respirations 20, blood pressure 120/68.  HEENT: Within normal limits bilaterally.  PERRLA.  NECK: Supple.  No masses.  Carotids 2+.  No bruits.  LUNGS: Clear to auscultation bilaterally.  HEART: S1 and S2, regular rate and rhythm.  No murmurs, rubs, or gallops.  ABDOMEN: Soft, nontender, nondistended.  Positive bowel  sounds.  EXTREMITIES: No clubbing, cyanosis, or edema.  ASSESSMENT/PLAN:  1. Chest pain.  Given Jonathan [name] cardiac risk factors, myocardial ischemia     is highly suspect.  He will be admitted to a telemetry unit for serial CK     and troponin I enzymes will be obtained.  A cardiology consult will be     arranged since he will at the very least need a Cardiolite stress test     during his admission.  2. Hypertension.  This has been well controlled with his current medications.  3. Hyperlipidemia.  Jonathan [name] has had recent mild elevation of his total     cholesterol and LDL.  He has not yet been started on cholesterol     lowering therapy. Dictated by:   Merlene Laughter Renae Gloss, M.D. Attending:  Merlene Laughter. Renae Gloss, M.D. DD:  11/05/01 TD:  11/05/01 Job: [id number]

## 2011-02-02 NOTE — Cardiovascular Report (Signed)
NAMENICODEMUS, DENK NO.:  [id number]   MEDICAL RECORD NO.:  [id number]          PATIENT TYPE:  OIB   LOCATION:  6523                         FACILITY:  [facility]   PHYSICIAN:  Madaline Savage, M.D.DATE OF BIRTH:  12/09/1935   DATE OF PROCEDURE:  06/04/2005  DATE OF DISCHARGE:                              CARDIAC CATHETERIZATION   PROCEDURES PERFORMED:  1.  Right coronary angiography in multiple projections.  2.  Direct coronary artery stenting of the proximal right coronary artery   COMPLICATIONS:  None.   ENTRY SITE:  Right femoral.   DYE USED:  Visipaque.   PATIENT PROFILE:  Mr. Fini is a delightful 75 year old very active  gentleman who has had two stents placed in his right coronary artery, one  distal and one mid, who recently presented to an emergency room in [place], [place] hospital for heartburn and when he came back to [place] we did a Cardiolite stress test. That stress test showed inferior  wall scar and mild lateral wall ischemia compared to a previously negative  Cardiolite following the placement of his first two stents. His EF was 60%.   Today, the patient has had Plavix in his daily regimen of medication for  approximately two and half to three weeks and he presents today for  outpatient coronary artery stenting planned. This procedure went well  without any complications.   RESULTS:  No pressures were recorded.   ANGIOGRAPHIC RESULTS:  The right coronary artery was a large and dominant  vessel with an upwards shepherd's crook deformity. There was a patent stent  in the mid RCA near second acute marginal branch and there was also a widely  patent stent in the trifurcating portion of a posterolateral branch  ostially. The lesion, of note, was in the downgoing limb of the shepherd's  crook but still proximal coronary artery and it was near the pulmonary conus  branch. For the procedure, I used a hockey-stick right guide  by Cordis that  was 6-French without side holes. I used a Research scientist (physical sciences) which proved to be  in excellent choice and I directly stented the vessel using a 3.0 x 13 mm  Cypher stent deployed it to 14 atmospheres twice, the first time for  approximately 55 seconds and the second time for 45 seconds. The patient had  zero chest pain and no evidence of any blood pressure drop but did have  marked ST-segment elevation that resolved quickly once the balloon was  deflated. After two inflations, I was well satisfied that the stent was  deployed using multiple views for a final angiographic viewing of the  vessel. No complications occurred angina.   Angiomax was used during the case. The ACT achieved was 364 seconds.  Following completion of the case, the Angiomax was turned off.   The patient did have some nausea postprocedure. Blood pressure was fine. We  gave Zofran and the patient left the cath lab with stable blood pressure,  heart rate and pedal pulses were present following the case.  ______________________________  Madaline Savage, M.D.     WHG/MEDQ  D:  06/04/2005  T:  06/05/2005  Job:  [id number]   cc:   Merlene Laughter. Renae Gloss, M.D.  [address]  [address]  [place]  [place] [address]  Fax: [phone]

## 2011-02-05 ENCOUNTER — Telehealth: Payer: Self-pay | Admitting: *Deleted

## 2011-02-05 NOTE — Telephone Encounter (Signed)
AndroGel 1% is typically 4 pumps a day. Before you call the  prescription, please confirm with the patient that 4 pumps is what he is doing

## 2011-02-05 NOTE — Telephone Encounter (Signed)
Should pt be using Androgel 4x a day?

## 2011-02-06 MED ORDER — TESTOSTERONE 12.5 MG/ACT (1%) TD GEL: 4.0000 "application " | Freq: Every day | TRANSDERMAL | Status: DC

## 2011-02-06 NOTE — Telephone Encounter (Signed)
I spoke w/ pt he is doing 4 pumps.

## 2011-02-19 ENCOUNTER — Telehealth: Payer: Self-pay | Admitting: Internal Medicine

## 2011-02-19 MED ORDER — METFORMIN HCL 1000 MG PO TABS: 1000.0000 mg | ORAL_TABLET | Freq: Two times a day (BID) | ORAL | Status: DC

## 2011-02-19 NOTE — Telephone Encounter (Signed)
Meds sent in

## 2011-02-19 NOTE — Telephone Encounter (Signed)
Patient needs new rx for metformin - [facility]

## 2011-02-22 ENCOUNTER — Other Ambulatory Visit: Payer: Self-pay | Admitting: Internal Medicine

## 2011-02-22 MED ORDER — GLIMEPIRIDE 2 MG PO TABS: 2.0000 mg | ORAL_TABLET | Freq: Every day | ORAL | Status: DC

## 2011-02-22 MED ORDER — METOPROLOL SUCCINATE ER 100 MG PO TB24: 100.0000 mg | ORAL_TABLET | Freq: Every day | ORAL | Status: DC

## 2011-02-22 MED ORDER — SIMVASTATIN 20 MG PO TABS: 20.0000 mg | ORAL_TABLET | Freq: Every day | ORAL | Status: DC

## 2011-02-22 NOTE — Telephone Encounter (Signed)
Sent in

## 2011-05-22 ENCOUNTER — Telehealth: Payer: Self-pay | Admitting: *Deleted

## 2011-05-22 ENCOUNTER — Ambulatory Visit (INDEPENDENT_AMBULATORY_CARE_PROVIDER_SITE_OTHER): Payer: Medicare Other | Admitting: Internal Medicine

## 2011-05-22 ENCOUNTER — Encounter: Payer: Self-pay | Admitting: Internal Medicine

## 2011-05-22 ENCOUNTER — Other Ambulatory Visit: Payer: Self-pay | Admitting: *Deleted

## 2011-05-22 VITALS — BP 132/70 | HR 74 | Temp 97.7°F | Resp 16 | Wt 258.2 lb

## 2011-05-22 DIAGNOSIS — E291 Testicular hypofunction: Secondary | ICD-10-CM

## 2011-05-22 DIAGNOSIS — I2510 Atherosclerotic heart disease of native coronary artery without angina pectoris: Secondary | ICD-10-CM

## 2011-05-22 DIAGNOSIS — E109 Type 1 diabetes mellitus without complications: Secondary | ICD-10-CM

## 2011-05-22 DIAGNOSIS — E785 Hyperlipidemia, unspecified: Secondary | ICD-10-CM

## 2011-05-22 DIAGNOSIS — Z136 Encounter for screening for cardiovascular disorders: Secondary | ICD-10-CM

## 2011-05-22 LAB — CBC WITH DIFFERENTIAL/PLATELET
Basophils Relative: 0.6 % (ref 0.0–3.0)
Eosinophils Absolute: 0.2 10*3/uL (ref 0.0–0.7)
Eosinophils Relative: 2.5 % (ref 0.0–5.0)
HCT: 52.9 % — ABNORMAL HIGH (ref 39.0–52.0)
Hemoglobin: 17.8 g/dL — ABNORMAL HIGH (ref 13.0–17.0)
Lymphs Abs: 2.1 10*3/uL (ref 0.7–4.0)
MCHC: 33.6 g/dL (ref 30.0–36.0)
MCV: 97.9 fl (ref 78.0–100.0)
Monocytes Absolute: 1 10*3/uL (ref 0.1–1.0)
Neutro Abs: 5.1 10*3/uL (ref 1.4–7.7)
RBC: 5.4 Mil/uL (ref 4.22–5.81)
WBC: 8.4 10*3/uL (ref 4.5–10.5)

## 2011-05-22 LAB — LIPID PANEL
LDL Cholesterol: 66 mg/dL (ref 0–99)
Total CHOL/HDL Ratio: 3
Triglycerides: 95 mg/dL (ref 0.0–149.0)

## 2011-05-22 LAB — BASIC METABOLIC PANEL
BUN: 14 mg/dL (ref 6–23)
CO2: 25 mEq/L (ref 19–32)
Calcium: 8.9 mg/dL (ref 8.4–10.5)
Creatinine, Ser: 1.2 mg/dL (ref 0.4–1.5)
Glucose, Bld: 86 mg/dL (ref 70–99)

## 2011-05-22 MED ORDER — TESTOSTERONE 20.25 MG/ACT (1.62%) TD GEL: 2.0000 "application " | Freq: Every day | TRANSDERMAL | Status: DC

## 2011-05-22 NOTE — Telephone Encounter (Signed)
I talked with pt. Lexiscan myoview scheduled for 05/24/11 10am. Appt scheduled for pt with Dr Myrtis Ser 05/24/11 at 2pm per Dr Shirlee Latch.

## 2011-05-22 NOTE — Assessment & Plan Note (Signed)
Although he had a bowl of cereal, I will  check a fasting lipid profile

## 2011-05-22 NOTE — Assessment & Plan Note (Addendum)
Long h/o CAD, used to be seen at [place] but likes transfer to [place]  Was asx until recently, now having CP, see HPI. Pain is atypical. EKG today  NSR, no acute  Will discuss w/ cards: admits for observation vs soon f/u with them. Addendum:  Discussed with the doctor of the day, they will set up a stress test in the next 48 hours. See instructions

## 2011-05-22 NOTE — Telephone Encounter (Signed)
Dr Shirlee Latch talked with Dr Drue Novel. Dr Shirlee Latch recommended lexiscan myoview and follow-up after the [facility]. Pt had been seen in the past by [facility].

## 2011-05-22 NOTE — Assessment & Plan Note (Addendum)
Due for a hemoglobin A1c, was supposed to take metformin twice a day but only taking 1 qd  Plan: A1 C. take metformin twice a day.

## 2011-05-22 NOTE — Patient Instructions (Signed)
You will be scheduled for a stress test within the next 48 hours. If you have severe or persistent chest pain, go to the ER immediately. Come back to see me in 3 months.

## 2011-05-22 NOTE — Progress Notes (Signed)
  Subjective:    Patient ID: Jonathan [name], male    DOB: [birth year], 75 y.o.   MRN: [id number]  HPI ROV Diabetes--has been taking metformin once a day instead of twice a day. Needs a prescription for lancets. Hypogonadism --needs to change the testosterone formulation In addition, today he reports chest pain essentially every night for the last month: Chest pain starts at around 11 PM, last 20 or 30 minutes, is located in the center of the chest, no radiation. "kind of burning", better after Zantac?Marland Kitchen There is no associated diaphoresis or nausea. Shortness of breath while having chest pain remains at baseline. No exertional chest pain.    Past Medical History  Diagnosis Date  . CAD (coronary artery disease)     s/p stents 2006, Dr Lynnea Ferrier  . PVD (peripheral vascular disease)     s/p stents at LE 2009  . Colon polyps     s/p several Cscopes.  . Diabetes mellitus     dx aprox 2009  . Hyperlipidemia     dx in 90s  . Hypertension     dx in the 90  . ED (erectile dysfunction)     has a vacumm device  . Hypogonadism male    Past Surgical History  Procedure Date  . Appendectomy   . Tonsillectomy      Review of Systems See HPI     Objective:   Physical Exam  Constitutional: He is oriented to person, place, and time. He appears well-developed.  HENT:  Head: Normocephalic and atraumatic.  Cardiovascular: Normal rate, regular rhythm and normal heart sounds.  Exam reveals no gallop and no friction rub.   No murmur heard. Pulmonary/Chest: Effort normal and breath sounds normal. No respiratory distress. He has no wheezes. He has no rales. He exhibits no tenderness.  Musculoskeletal: He exhibits no edema.  Neurological: He is alert and oriented to person, place, and time.  Skin: He is not diaphoretic.          Assessment & Plan:  Today , I spent more than 25 min with the patient, >50% of the time counseling, and coordinating his care

## 2011-05-22 NOTE — Assessment & Plan Note (Addendum)
Needs a new formulation for testosterone, will take 2 pumps only. Testosterone level on return to the office

## 2011-05-23 ENCOUNTER — Telehealth: Payer: Self-pay | Admitting: *Deleted

## 2011-05-23 ENCOUNTER — Other Ambulatory Visit: Payer: Self-pay | Admitting: *Deleted

## 2011-05-23 ENCOUNTER — Encounter: Payer: Self-pay | Admitting: Cardiology

## 2011-05-23 DIAGNOSIS — I2510 Atherosclerotic heart disease of native coronary artery without angina pectoris: Secondary | ICD-10-CM | POA: Insufficient documentation

## 2011-05-23 DIAGNOSIS — I739 Peripheral vascular disease, unspecified: Secondary | ICD-10-CM | POA: Insufficient documentation

## 2011-05-23 MED ORDER — TESTOSTERONE 20.25 MG/ACT (1.62%) TD GEL: 2.0000 "application " | Freq: Every day | TRANSDERMAL | Status: DC

## 2011-05-23 NOTE — Telephone Encounter (Signed)
Rx for Androgel 90-day supply printed & manually faxed to pharmacy.

## 2011-05-24 ENCOUNTER — Encounter: Payer: Self-pay | Admitting: Cardiology

## 2011-05-24 ENCOUNTER — Ambulatory Visit (INDEPENDENT_AMBULATORY_CARE_PROVIDER_SITE_OTHER): Payer: Medicare Other | Admitting: Cardiology

## 2011-05-24 ENCOUNTER — Ambulatory Visit (HOSPITAL_COMMUNITY): Payer: Medicare Other | Attending: Cardiology | Admitting: Radiology

## 2011-05-24 DIAGNOSIS — R0789 Other chest pain: Secondary | ICD-10-CM

## 2011-05-24 DIAGNOSIS — J449 Chronic obstructive pulmonary disease, unspecified: Secondary | ICD-10-CM | POA: Insufficient documentation

## 2011-05-24 DIAGNOSIS — J9611 Chronic respiratory failure with hypoxia: Secondary | ICD-10-CM | POA: Insufficient documentation

## 2011-05-24 DIAGNOSIS — R0609 Other forms of dyspnea: Secondary | ICD-10-CM

## 2011-05-24 DIAGNOSIS — R0602 Shortness of breath: Secondary | ICD-10-CM

## 2011-05-24 DIAGNOSIS — I2510 Atherosclerotic heart disease of native coronary artery without angina pectoris: Secondary | ICD-10-CM

## 2011-05-24 DIAGNOSIS — I10 Essential (primary) hypertension: Secondary | ICD-10-CM

## 2011-05-24 DIAGNOSIS — I739 Peripheral vascular disease, unspecified: Secondary | ICD-10-CM

## 2011-05-24 DIAGNOSIS — R079 Chest pain, unspecified: Secondary | ICD-10-CM | POA: Insufficient documentation

## 2011-05-24 MED ORDER — TECHNETIUM TC 99M TETROFOSMIN IV KIT
10.0000 | PACK | Freq: Once | INTRAVENOUS | Status: AC | PRN
  Administered 2011-05-24: 10 via INTRAVENOUS

## 2011-05-24 MED ORDER — REGADENOSON 0.4 MG/5ML IV SOLN
0.4000 mg | Freq: Once | INTRAVENOUS | Status: AC
  Administered 2011-05-24: 0.4 mg via INTRAVENOUS

## 2011-05-24 MED ORDER — TECHNETIUM TC 99M TETROFOSMIN IV KIT
33.0000 | PACK | Freq: Once | INTRAVENOUS | Status: AC | PRN
  Administered 2011-05-24: 33 via INTRAVENOUS

## 2011-05-24 NOTE — Progress Notes (Signed)
[facility] SITE 3 NUCLEAR MED [address] [place] [address] [phone]  Cardiology Nuclear Med Study  Jonathan [name]. is a 75 y.o. male [id number] [birth year]   Nuclear Med Background Indication for Stress Test:  Evaluation for Ischemia and Stent Patency History:  Heart Catheterization, Myocardial Infarction, Myocardial Perfusion Study and Stents Cardiac Risk Factors: History of Smoking, Hypertension, Lipids, NIDDM and PVD  Symptoms:  Chest Pain (last date of chest discomfort 2 days ago), DOE and SOB   Nuclear Pre-Procedure Caffeine/Decaff Intake:  None NPO After: 12:00am   Lungs:  Clear IV 0.9% NS with Angio Cath:  20g  IV Site: R Antecubital  IV Started by:  Stanton Kidney, EMT-P  Chest Size (in):  48  Cup Size: n/a  Height: 6\' 2"  (1.88 m)  Weight:  254 lb (115.214 kg)  BMI:  Body mass index is 32.61 kg/(m^2). Tech Comments:  Toprol held > 24 hours, per patient.    Nuclear Med Study 1 or 2 day study: 1 day  Stress Test Type:  Treadmill/Lexiscan  Reading MD: Charlton Haws, MD  Order Authorizing Provider:  Dr. Lovena Neighbours  Resting Radionuclide: Technetium 58m Tetrofosmin  Resting Radionuclide Dose: 10 mCi   Stress Radionuclide:  Technetium 32m Tetrofosmin  Stress Radionuclide Dose: 33 mCi           Stress Protocol Rest HR: 65 Stress HR: 110  Rest BP: 159/98 Stress BP: 177/86  Exercise Time (min): 9:00 METS: 8.6   Predicted Max HR: 147 bpm % Max HR: 74.83 bpm Rate Pressure Product: 21308   Dose of Adenosine (mg):  n/a Dose of Lexiscan: 0.4 mg  Dose of Atropine (mg): n/a Dose of Dobutamine: n/a mcg/kg/min (at max HR)  Stress Test Technologist: Bonnita Levan, RN  Nuclear Technologist:  Domenic Polite, CNMT     Rest Procedure:  Myocardial perfusion imaging was performed at rest 45 minutes following the intravenous administration of Technetium 20m Tetrofosmin. Rest ECG: NSR - Normal EKG  Stress Procedure:  The patient exercised utilizing the  Bruce protocol for 9:00. He was unable to achieve target heart rate and was changed to Treadmill/Lexiscan. He received  IV Lexiscan 0.4mg  over 15 seconds with concurrent low-level exercise.  Technetium 69m Tetrofosmin was injected at 30 seconds and the patient continued to walk for one more minute, and myocardial perfusion imaging was performed after a 45 minute delay.The patient became very short of breath, when exercising, with wheezing and O2 sats dropping to 82%.There were no significant changes with exercise.  Occ. PVC's and PAC's. Noted. Stress ECG: No significant change from baseline ECG  QPS Raw Data Images:  Normal; no motion artifact; normal heart/lung ratio. Stress Images:  There is decreased uptake in the inferior wall. Rest Images:  There is decreased uptake in the inferior wall. Subtraction (SDS):  There is a fixed defect that is most consistent with a previous infarction. Transient Ischemic Dilatation (Normal <1.22):  1.01 Lung/Heart Ratio (Normal <0.45):  .20  Quantitative Gated Spect Images QGS EDV:  111 ml QGS ESV:  50 ml QGS cine images:  NL LV Function; NL Wall Motion QGS EF: 55%  Impression Exercise Capacity:  Good exercise capacity. BP Response:  Normal blood pressure response. Clinical Symptoms:  Mild chest pain/dyspnea. ECG Impression:  No significant ST segment change suggestive of ischemia. Comparison with Prior Nuclear Study: No images to compare  Overall Impression:  Small inferior wall infarct with mild periinfarct ischemia  Jenkins Rouge

## 2011-05-24 NOTE — Progress Notes (Signed)
HPI Patient is seen today for evaluation of chest discomfort and shortness of breath.  He is also here to establish as a new patient in the cardiology division.  He has been seen by other cardiologists in the past.  As part of today's evaluation I have reviewed the hospital records.  He has had prior procedures.  In the remote past he had 2 stents placed to the right coronary artery.  In 2006 he had a third stent placed to the right coronary artery.  He has also had stenting of the peripheral vessels in his legs.  In the past his ischemic symptom related to his heart was a sensation of indigestion.  He has not had this.  Recently he did have some chest discomfort that was more like his reflux.  He uses Zantac and felt better.  Plan was made for him to have a stress test which was done today.  This was quite appropriate as he had not had any type of intervention since 2006.  The patient does have shortness of breath.  Cardiovascular with exertion.  He does not have palpitations.  Today the patient underwent a stress nuclear scan.  He walked approximately 9 minutes.  He did not reach adequate heart rate and the study was changed to [employer].  The EKGs when he was walking revealed no significant change.  He had a significant drop in his O2 sat.  It dropped to 82% with exercise.  In recovery at rest it was 97%.  The nuclear images revealed a question of slight scar in the inferior wall.  There is no significant ischemia.  Overall ejection fraction was good. No Known Allergies  Current Outpatient Prescriptions  Medication Sig Dispense Refill  . amLODipine (NORVASC) 10 MG tablet Take 1 tablet (10 mg total) by mouth daily.  90 tablet  1  . aspirin 81 MG tablet Take 81 mg by mouth daily.        . clopidogrel (PLAVIX) 75 MG tablet Take 1 tablet (75 mg total) by mouth daily.  90 tablet  1  . cyclobenzaprine (FLEXERIL) 5 MG tablet 1 two times a day and at bedtime as needed.       . ergocalciferol (VITAMIN D2)  50000 UNITS capsule Take 50,000 Units by mouth. 1 by mouth every mon and thurs       . glimepiride (AMARYL) 2 MG tablet Take 1 tablet (2 mg total) by mouth daily before breakfast.  90 tablet  1  . losartan (COZAAR) 100 MG tablet Take 1 tablet (100 mg total) by mouth daily.  90 tablet  1  . metFORMIN (GLUCOPHAGE) 1000 MG tablet Take 1 tablet (1,000 mg total) by mouth 2 (two) times daily with a meal.  180 tablet  3  . metoprolol (TOPROL-XL) 100 MG 24 hr tablet Take 1 tablet (100 mg total) by mouth daily.  90 tablet  1  . simvastatin (ZOCOR) 20 MG tablet Take 1 tablet (20 mg total) by mouth at bedtime.  90 tablet  1  . sitaGLIPtan (JANUVIA) 100 MG tablet Take 1 tablet (100 mg total) by mouth daily.  90 tablet  1  . Testosterone (ANDROGEL PUMP) 20.25 MG/ACT (1.62%) GEL Place 2 application onto the skin daily.  225 g  1  . triamterene-hydrochlorothiazide (MAXZIDE-25) 37.5-25 MG per tablet Take 1 tablet by mouth daily.         No current facility-administered medications for this visit.   Facility-Administered Medications Ordered in Other Visits  Medication Dose Route Frequency Provider Last Rate Last Dose  . regadenoson (LEXISCAN) injection SOLN 0.4 mg  0.4 mg Intravenous Once Luis Abed, MD   0.4 mg at 05/24/11 1135  . technetium tetrofosmin (TC-MYOVIEW) injection 10 milli Curie  10 milli Curie Intravenous Once PRN Wendall Stade, MD   10 milli Curie at 05/24/11 1010  . technetium tetrofosmin (TC-MYOVIEW) injection 33 milli Curie  33 milli Curie Intravenous Once PRN Wendall Stade, MD   33 milli Curie at 05/24/11 1135    History   Social History  . Marital Status: Married    Spouse Name: N/A    Number of Children: 6  . Years of Education: N/A   Occupational History  . retired     he preaches    Social History Main Topics  . Smoking status: Former Smoker -- 2.0 packs/day  . Smokeless tobacco: Not on file  . Alcohol Use: Yes     socially   . Drug Use: No  . Sexually Active: Not on  file   Other Topics Concern  . Not on file   Social History Narrative   Has 2 step sons, and 4 childrenRegular exercise- no but active     Family History  Problem Relation Age of Onset  . Heart disease Father   . Diabetes      GM, nephews, many family members  . Hyperlipidemia      ?  Marland Kitchen Hypertension Father   . Stroke Father   . Colon cancer Neg Hx   . Prostate cancer Neg Hx     Past Medical History  Diagnosis Date  . CAD (coronary artery disease)     Two RCA stents remotely / 3rd RCA stent 2006  . PVD (peripheral vascular disease)     s/p stents at LE 2009, Dr Jacinto Halim  . Colon polyps     s/p several Cscopes.  . Diabetes mellitus     dx aprox 2009  . Hyperlipidemia     dx in 90s  . Hypertension     dx in the 90  . ED (erectile dysfunction)     has a vacumm device  . Hypogonadism male   . Shortness of breath     O2 Sat dropped to 82% walking on the treadmill, September, 2012    Past Surgical History  Procedure Date  . Appendectomy   . Tonsillectomy     ROS  Patient denies fever, chills, headache, sweats, rash, change in vision, change in hearing, cough, nausea vomiting, urinary symptoms.  All other systems are reviewed and are negative.  PHYSICAL EXAM Patient is stable today.  Head is atraumatic.  There is no jugular venous distention.  Lungs are clear.  Respiratory effort is nonlabored.  Cardiac exam reveals an S1-S2.  No clicks or significant murmurs.  Abdomen is soft.  There is no peripheral edema.  No musculoskeletal deformities.  No skin rashes. Filed Vitals:   05/24/11 1346  BP: 139/85  Pulse: 85  Resp: 12  Height: 6\' 2"  (1.88 m)  Weight: 257 lb (116.574 kg)    EKG Is done today as part of his stress test.  He did walk on the treadmill and there was no significant ST change.  ASSESSMENT & PLAN

## 2011-05-24 NOTE — Assessment & Plan Note (Signed)
The patient is not having any significant claudication at this time.  No further workup is needed.

## 2011-05-24 NOTE — Assessment & Plan Note (Signed)
Patient has known coronary disease with 3 stents in his right coronary artery in the past.  Currently he has good LV function.  His current symptoms are not his usual angina.  As described in the history of present illness, the nuclear test today reveals no significant ischemia.  There may be a very slight inferior scar.  Wall motion is good.  The patient does not need any further cardiac workup.  He needs ongoing aggressive secondary prevention.

## 2011-05-24 NOTE — Assessment & Plan Note (Signed)
Blood pressure at rest is stable.  No further workup.  We have you see the patient back in one year for cardiology followup.

## 2011-05-24 NOTE — Patient Instructions (Signed)
Your physician wants you to follow-up in:  12 months.  You will receive a reminder letter in the mail two months in advance. If you don't receive a letter, please call our office to schedule the follow-up appointment.   

## 2011-05-24 NOTE — Assessment & Plan Note (Signed)
The patient's shortness of breath appears to be pulmonary.  As outlined his O2 sat dropped to 82% when he became short of breath walking on the treadmill.  He also had some wheezing.  Further approach to his pulmonary status might be helpful.

## 2011-06-18 ENCOUNTER — Encounter: Payer: Self-pay | Admitting: Internal Medicine

## 2011-06-18 ENCOUNTER — Ambulatory Visit (INDEPENDENT_AMBULATORY_CARE_PROVIDER_SITE_OTHER): Payer: Medicare Other | Admitting: Internal Medicine

## 2011-06-18 DIAGNOSIS — R0602 Shortness of breath: Secondary | ICD-10-CM

## 2011-06-18 DIAGNOSIS — G8929 Other chronic pain: Secondary | ICD-10-CM | POA: Insufficient documentation

## 2011-06-18 DIAGNOSIS — M549 Dorsalgia, unspecified: Secondary | ICD-10-CM

## 2011-06-18 DIAGNOSIS — E291 Testicular hypofunction: Secondary | ICD-10-CM

## 2011-06-18 DIAGNOSIS — E109 Type 1 diabetes mellitus without complications: Secondary | ICD-10-CM

## 2011-06-18 DIAGNOSIS — I2510 Atherosclerotic heart disease of native coronary artery without angina pectoris: Secondary | ICD-10-CM

## 2011-06-18 LAB — GLUCOSE, CAPILLARY: Glucose-Capillary: 123 — ABNORMAL HIGH

## 2011-06-18 NOTE — Assessment & Plan Note (Signed)
Will control per last hemoglobin A1c

## 2011-06-18 NOTE — Assessment & Plan Note (Signed)
Chronic back pain w/o red flags sx rec stretching , tylenol, if sx severe will let me know

## 2011-06-18 NOTE — Assessment & Plan Note (Addendum)
Long history of shortness or breath, former smoker, denies cough but has occasional wheezing. O2 sat dropped while doing the treadmill. Today O2  dropped to 88% w/  exertion Plan: PFT Likely will benefit from spiriva

## 2011-06-18 NOTE — Progress Notes (Signed)
  Subjective:    Patient ID: Jonathan Beath., male    DOB: Jan 15, 1936, 75 y.o.   MRN: [id number]  HPI Routine office visit CAD, recent stress test negative I've been getting requests for a back brace in reference to Jonathan [name], he tells me that he has not requested one however he does have back pain for years, usually in the morning, no radiation, pain resolved shortly after he become active. Has never tried any medication, or physical therapy. Patient has shortness of breath for years, was noted to have a low O2 sat while walking on the treadmill.  Past Medical History  Diagnosis Date  . CAD (coronary artery disease)     Two RCA stents remotely / 3rd RCA stent 2006  . PVD (peripheral vascular disease)     s/p stents at LE 2009, Dr Jacinto Halim  . Colon polyps     s/p several Cscopes.  . Diabetes mellitus     dx aprox 2009  . Hyperlipidemia     dx in 90s  . Hypertension     dx in the 90  . ED (erectile dysfunction)     has a vacumm device  . Hypogonadism male   . Shortness of breath     O2 Sat dropped to 82% walking on the treadmill, September, 2012   Past Surgical History  Procedure Date  . Appendectomy   . Tonsillectomy     Review of Systems No actual cough, occasionally he wheezes. No sputum production. No N-V-D     Objective:   Physical Exam  Constitutional: He appears well-developed and well-nourished.  Cardiovascular: Normal rate and regular rhythm.   No murmur heard. Pulmonary/Chest: Effort normal and breath sounds normal. No respiratory distress. He has no wheezes. He has no rales.  Musculoskeletal: He exhibits no edema.          Assessment & Plan:

## 2011-06-18 NOTE — Assessment & Plan Note (Signed)
Due for labs in a couple of months

## 2011-06-18 NOTE — Assessment & Plan Note (Signed)
Recent Stress test negative

## 2011-07-10 ENCOUNTER — Ambulatory Visit (INDEPENDENT_AMBULATORY_CARE_PROVIDER_SITE_OTHER): Payer: Medicare Other | Admitting: Internal Medicine

## 2011-07-10 DIAGNOSIS — R0602 Shortness of breath: Secondary | ICD-10-CM

## 2011-07-10 LAB — PULMONARY FUNCTION TEST

## 2011-07-10 NOTE — Progress Notes (Signed)
PFT done today. 

## 2011-07-11 ENCOUNTER — Telehealth: Payer: Self-pay | Admitting: Internal Medicine

## 2011-07-11 NOTE — Telephone Encounter (Signed)
Reviewed pt's chart.  Pt has never seen Cy before.  Looks like Dr. Drue Novel ordered PFTs.  Called and spoke with pt's spouse.  Informed her I don't show any appts for pt on Friday 10/26.  So unsure who called their home.  Also informed wife that she would need to get in touch with Dr. Drue Novel regarding the PFT results since he is the physician who ordered them.  Wife verbalized understanding and denied any questions.

## 2011-07-19 ENCOUNTER — Telehealth: Payer: Self-pay | Admitting: Internal Medicine

## 2011-07-19 MED ORDER — TIOTROPIUM BROMIDE MONOHYDRATE 18 MCG IN CAPS: 18.0000 ug | ORAL_CAPSULE | Freq: Every day | RESPIRATORY_TRACT | Status: DC

## 2011-07-19 NOTE — Telephone Encounter (Signed)
Call pt: pfts showed some reactive airway dz: Trial with spiriva 1 puff qd , give samples and a Rx #1, 6 RF Needs OB by 12 -2012 to see how he is doing

## 2011-07-19 NOTE — Telephone Encounter (Signed)
Spoke with Fayrene Fearing Winget's)wife and informed them that the samples are available for pick up at the front desk also that the pharmacy will get the Rx as well

## 2011-08-07 ENCOUNTER — Encounter: Payer: Self-pay | Admitting: Internal Medicine

## 2011-08-27 ENCOUNTER — Ambulatory Visit: Payer: Medicare Other | Admitting: Internal Medicine

## 2011-09-13 ENCOUNTER — Encounter: Payer: Self-pay | Admitting: Internal Medicine

## 2011-09-13 ENCOUNTER — Ambulatory Visit (INDEPENDENT_AMBULATORY_CARE_PROVIDER_SITE_OTHER): Payer: Medicare Other | Admitting: Internal Medicine

## 2011-09-13 VITALS — BP 134/88 | HR 78 | Temp 97.6°F | Wt 259.4 lb

## 2011-09-13 DIAGNOSIS — E291 Testicular hypofunction: Secondary | ICD-10-CM

## 2011-09-13 DIAGNOSIS — R0602 Shortness of breath: Secondary | ICD-10-CM

## 2011-09-13 DIAGNOSIS — I10 Essential (primary) hypertension: Secondary | ICD-10-CM

## 2011-09-13 DIAGNOSIS — E109 Type 1 diabetes mellitus without complications: Secondary | ICD-10-CM

## 2011-09-13 LAB — BASIC METABOLIC PANEL
BUN: 14 mg/dL (ref 6–23)
CO2: 30 mEq/L (ref 19–32)
Chloride: 107 mEq/L (ref 96–112)
Creatinine, Ser: 1.3 mg/dL (ref 0.4–1.5)
Potassium: 4.1 mEq/L (ref 3.5–5.1)

## 2011-09-13 LAB — HEMOGLOBIN A1C: Hgb A1c MFr Bld: 7.3 % — ABNORMAL HIGH (ref 4.6–6.5)

## 2011-09-13 NOTE — Progress Notes (Signed)
  Subjective:    Patient ID: Jonathan Beath., male    DOB: 1935/11/01, 75 y.o.   MRN: [id number]  HPI Routine office visit Shortness of breath, had PFTs done, see assessment and plan. Currently on Spiriva, has noted his exercise tolerance has increased a little. Hypogonadism, taking only 1 dose of testosterone daily instead of 2 Diabetes, good medication compliance, CBGs in the morning around 100. High cholesterol, good medication compliance Hypertension, good medication compliance, BP today 134/88, "usually better than that at home"   Past Medical History: Diabetes mellitus-- dx aprox 2009 Hyperlipidemia--dx in the 90s Hypertension-- dx in the 90  Vascular, heart doctor was Dr Jacinto Halim, now is Dr Lynnea Ferrier  CAD  s/p stents, 3rd RCA --> 2006 PVD -- s/p stents at LE 2009 Polyps in colon, s/p several Cscopes , last 2011 w/ Dr Loreta Ave  ED -- has use a vacumm device Hypogonadism --on testosterone Rx by Cardiology per patient  Shortness of breath ---O2 Sat dropped to 82% walking on the treadmill, September, 2012   Past Surgical History: Appendectomy Tonsillectomy  Social History: Married children, 4 and 2 step sons Retired, he  preaches  Former Smoker, quit 1987 (used to smoke 2ppd) Alcohol use-yes-socially  Drug use-no Regular exercise-- no but active   Review of Systems No chest pain or shortness of breath. No claudication No nausea, vomiting, diarrhea Denies any fatigue or feeling sleepy throughout the day.     Objective:   Physical Exam  Constitutional: He is oriented to person, place, and time. He appears well-developed and well-nourished. No distress.  Neck:       Normal carotid pulse  Cardiovascular: Normal rate, regular rhythm and normal heart sounds.   No murmur heard. Pulmonary/Chest: Effort normal and breath sounds normal. No respiratory distress. He has no wheezes. He has no rales.  Musculoskeletal: He exhibits no edema.  Neurological: He is alert and oriented to  person, place, and time.  Skin: Skin is warm and dry. He is not diaphoretic.  Psychiatric: He has a normal mood and affect. His behavior is normal. Judgment and thought content normal.      Assessment & Plan:

## 2011-09-13 NOTE — Assessment & Plan Note (Signed)
Apparently well controlled,

## 2011-09-13 NOTE — Assessment & Plan Note (Addendum)
Former heavy smoker, PFTs showed some reactive airway disease, on spiriva, subjectively w/ increase exercise tolerance . Today O2 sat 95% at rest , dropped to 92% w/ exertion ---> better than before: no change

## 2011-09-13 NOTE — Assessment & Plan Note (Addendum)
Currently taking AndroGel one  Pump qd  instead of 2. Labs

## 2011-09-13 NOTE — Assessment & Plan Note (Signed)
Apparently well controlled, no change. Last potassium was slightly low, labs.

## 2011-09-14 ENCOUNTER — Encounter: Payer: Self-pay | Admitting: Internal Medicine

## 2011-09-14 LAB — TESTOSTERONE, FREE, TOTAL, SHBG
Testosterone, Free: 42 pg/mL — ABNORMAL LOW (ref 47.0–244.0)
Testosterone-% Free: 1.5 % — ABNORMAL LOW (ref 1.6–2.9)
Testosterone: 277.97 ng/dL (ref 250–890)

## 2011-09-20 ENCOUNTER — Encounter: Payer: Self-pay | Admitting: *Deleted

## 2011-09-20 MED ORDER — GLIMEPIRIDE 4 MG PO TABS: 4.0000 mg | ORAL_TABLET | Freq: Every day | ORAL | Status: DC

## 2011-09-21 ENCOUNTER — Encounter: Payer: Self-pay | Admitting: Family Medicine

## 2011-09-21 ENCOUNTER — Ambulatory Visit: Payer: Medicare Other | Admitting: Internal Medicine

## 2011-09-21 ENCOUNTER — Ambulatory Visit (INDEPENDENT_AMBULATORY_CARE_PROVIDER_SITE_OTHER): Payer: Medicare Other | Admitting: Family Medicine

## 2011-09-21 DIAGNOSIS — R1013 Epigastric pain: Secondary | ICD-10-CM | POA: Diagnosis not present

## 2011-09-21 DIAGNOSIS — R1310 Dysphagia, unspecified: Secondary | ICD-10-CM | POA: Diagnosis not present

## 2011-09-21 DIAGNOSIS — K3189 Other diseases of stomach and duodenum: Secondary | ICD-10-CM | POA: Diagnosis not present

## 2011-09-21 NOTE — Patient Instructions (Signed)
Diet for GERD or PUD Nutrition therapy can help ease the discomfort of gastroesophageal reflux disease (GERD) and peptic ulcer disease (PUD).  HOME CARE INSTRUCTIONS   Eat your meals slowly, in a relaxed setting.   Eat 5 to 6 small meals per day.   If a food causes distress, stop eating it for a period of time.  FOODS TO AVOID  Coffee, regular or decaffeinated.   Cola beverages, regular or low calorie.   Tea, regular or decaffeinated.   Pepper.   Cocoa.   High fat foods, including meats.   Butter, margarine, hydrogenated oil (trans fats).   Peppermint or spearmint (if you have GERD).   Fruits and vegetables if not tolerated.   Alcohol.   Nicotine (smoking or chewing). This is one of the most potent stimulants to acid production in the gastrointestinal tract.   Any food that seems to aggravate your condition.  If you have questions regarding your diet, ask your caregiver or a registered dietitian. TIPS  Lying flat may make symptoms worse. Keep the head of your bed raised 6 to 9 inches (15 to 23 cm) by using a foam wedge or blocks under the legs of the bed.   Do not lay down until 3 hours after eating a meal.   Daily physical activity may help reduce symptoms.  MAKE SURE YOU:   Understand these instructions.   Will watch your condition.   Will get help right away if you are not doing well or get worse.  Document Released: 09/03/2005 Document Revised: 05/16/2011 Document Reviewed: 01/17/2009 [employer] Patient Information 2012 [employer]. 

## 2011-09-21 NOTE — Progress Notes (Signed)
  Subjective:     Jonathan [name]. is an 76 y.o. male who presents for evaluation of heartburn. This has been associated with belching, chest pain, cough and heartburn. He denies abdominal bloating, choking on food, dysphagia, early satiety, fullness after meals, hematemesis, hoarseness, laryngitis, melena, midespigastric pain, nausea, need to clear throat frequently, nocturnal burning, odynophagia, regurgitation of undigested food, shortness of breath, symptoms primarily relate to meals, and lying down after meals, unexpected weight loss, upper abdominal discomfort and wheezing. Symptoms have been present for 2 weeks. He has had dysphagia for solids. He has not lost weight. He denies melena, hematochezia, hematemesis, and coffee ground emesis. Medical therapy in the past has included: proton pump inhibitors. --prilosec with little relief.  Pt states he feels like his food gets stuck and doesn't go all the way down The following portions of the patient's history were reviewed and updated as appropriate: allergies, current medications, past family history, past medical history, past social history, past surgical history and problem list.  Review of Systems Pertinent items are noted in HPI.   Objective:     BP 116/76  Pulse 80  Temp(Src) 97.5 F (36.4 C) (Oral)  Wt 257 lb 6.4 oz (116.756 kg)  SpO2 96% General appearance: AAOx3  NAD Lungs: CTAB/L  RRW Heart: S1S2  No Murmur Abdomen: soft, NT, no masses  Assessment:    Gastroesophageal Reflux Disease, with Dysphagia    Plan:    Dexilant 60 mg 1 po qd  Gerd diet given to pt Check labs Refer to GI---suspect possible stricture

## 2011-09-24 ENCOUNTER — Other Ambulatory Visit: Payer: Self-pay | Admitting: Internal Medicine

## 2011-09-24 ENCOUNTER — Other Ambulatory Visit: Payer: Medicare Other

## 2011-09-24 ENCOUNTER — Other Ambulatory Visit (INDEPENDENT_AMBULATORY_CARE_PROVIDER_SITE_OTHER): Payer: Medicare Other

## 2011-09-24 DIAGNOSIS — R1310 Dysphagia, unspecified: Secondary | ICD-10-CM

## 2011-09-24 DIAGNOSIS — K3189 Other diseases of stomach and duodenum: Secondary | ICD-10-CM

## 2011-09-24 DIAGNOSIS — R1013 Epigastric pain: Secondary | ICD-10-CM

## 2011-09-24 LAB — BASIC METABOLIC PANEL
Calcium: 9.2 mg/dL (ref 8.4–10.5)
Chloride: 106 mEq/L (ref 96–112)
Creatinine, Ser: 1.4 mg/dL (ref 0.4–1.5)
Sodium: 141 mEq/L (ref 135–145)

## 2011-09-24 LAB — CBC WITH DIFFERENTIAL/PLATELET
Basophils Relative: 0.4 % (ref 0.0–3.0)
Eosinophils Relative: 3.4 % (ref 0.0–5.0)
Hemoglobin: 17.9 g/dL — ABNORMAL HIGH (ref 13.0–17.0)
Lymphocytes Relative: 33.7 % (ref 12.0–46.0)
Neutro Abs: 4.5 10*3/uL (ref 1.4–7.7)
Neutrophils Relative %: 52.7 % (ref 43.0–77.0)
RBC: 5.44 Mil/uL (ref 4.22–5.81)
WBC: 8.6 10*3/uL (ref 4.5–10.5)

## 2011-09-25 LAB — HEPATIC FUNCTION PANEL
ALT: 24 U/L (ref 0–53)
AST: 18 U/L (ref 0–37)
Alkaline Phosphatase: 57 U/L (ref 39–117)
Bilirubin, Direct: 0.2 mg/dL (ref 0.0–0.3)
Total Bilirubin: 0.9 mg/dL (ref 0.3–1.2)
Total Protein: 6.8 g/dL (ref 6.0–8.3)

## 2011-09-28 ENCOUNTER — Other Ambulatory Visit: Payer: Self-pay | Admitting: *Deleted

## 2011-09-28 MED ORDER — TESTOSTERONE 20.25 MG/ACT (1.62%) TD GEL: 1.0000 "application " | Freq: Every day | TRANSDERMAL | Status: DC

## 2011-09-28 MED ORDER — LOSARTAN POTASSIUM 100 MG PO TABS: 100.0000 mg | ORAL_TABLET | Freq: Every day | ORAL | Status: DC

## 2011-09-28 NOTE — Telephone Encounter (Signed)
Rx sent 

## 2011-10-05 ENCOUNTER — Telehealth: Payer: Self-pay

## 2011-10-05 MED ORDER — AMOXICILL-CLARITHRO-LANSOPRAZ PO MISC: Freq: Two times a day (BID) | ORAL | Status: AC

## 2011-10-05 NOTE — Telephone Encounter (Signed)
Message copied by Arnette Norris on Fri Oct 05, 2011  3:10 PM ------      Message from: Lelon Perla      Created: Sat Sep 29, 2011 12:44 PM       + hpylori---  Prev pac x 2       GI pending

## 2011-10-05 NOTE — Telephone Encounter (Signed)
Discussed with patient and he stated he had been out of town and his is why he did not return my call. He wanted Rx to be sent to [facility] and his GI apt is scheduled.     KP

## 2011-10-10 ENCOUNTER — Telehealth: Payer: Self-pay | Admitting: Internal Medicine

## 2011-10-10 NOTE — Telephone Encounter (Signed)
Testosterone Rx printed for [facility] on 09/28/11. Will verify if received.

## 2011-10-12 NOTE — Telephone Encounter (Signed)
Please see if this needs to be re-printed & faxed.Jonathan [name] so much.

## 2011-11-02 ENCOUNTER — Telehealth: Payer: Self-pay | Admitting: Internal Medicine

## 2011-11-02 NOTE — Telephone Encounter (Signed)
OK...mailing results.

## 2011-11-02 NOTE — Telephone Encounter (Signed)
Patient is requesting that we mail his last two lab results to him.

## 2011-11-20 ENCOUNTER — Other Ambulatory Visit: Payer: Self-pay | Admitting: *Deleted

## 2011-11-20 MED ORDER — GLIMEPIRIDE 4 MG PO TABS: 4.0000 mg | ORAL_TABLET | Freq: Every day | ORAL | Status: DC

## 2011-11-20 MED ORDER — LOSARTAN POTASSIUM 100 MG PO TABS: 100.0000 mg | ORAL_TABLET | Freq: Every day | ORAL | Status: DC

## 2011-11-20 NOTE — Telephone Encounter (Signed)
Rx sent 

## 2011-12-11 ENCOUNTER — Ambulatory Visit (INDEPENDENT_AMBULATORY_CARE_PROVIDER_SITE_OTHER): Payer: Medicare Other | Admitting: Internal Medicine

## 2011-12-11 VITALS — BP 146/88 | HR 61 | Temp 97.9°F | Wt 261.0 lb

## 2011-12-11 DIAGNOSIS — Z0000 Encounter for general adult medical examination without abnormal findings: Secondary | ICD-10-CM | POA: Diagnosis not present

## 2011-12-11 DIAGNOSIS — E559 Vitamin D deficiency, unspecified: Secondary | ICD-10-CM | POA: Insufficient documentation

## 2011-12-11 DIAGNOSIS — I10 Essential (primary) hypertension: Secondary | ICD-10-CM

## 2011-12-11 DIAGNOSIS — R0602 Shortness of breath: Secondary | ICD-10-CM | POA: Diagnosis not present

## 2011-12-11 DIAGNOSIS — R972 Elevated prostate specific antigen [PSA]: Secondary | ICD-10-CM

## 2011-12-11 DIAGNOSIS — E109 Type 1 diabetes mellitus without complications: Secondary | ICD-10-CM

## 2011-12-11 DIAGNOSIS — Z79899 Other long term (current) drug therapy: Secondary | ICD-10-CM

## 2011-12-11 DIAGNOSIS — E291 Testicular hypofunction: Secondary | ICD-10-CM | POA: Diagnosis not present

## 2011-12-11 DIAGNOSIS — E785 Hyperlipidemia, unspecified: Secondary | ICD-10-CM | POA: Diagnosis not present

## 2011-12-11 LAB — PSA: PSA: 0.59 ng/mL (ref 0.10–4.00)

## 2011-12-11 LAB — LIPID PANEL
LDL Cholesterol: 70 mg/dL (ref 0–99)
Total CHOL/HDL Ratio: 3
Triglycerides: 81 mg/dL (ref 0.0–149.0)

## 2011-12-11 LAB — HEMOGLOBIN A1C: Hgb A1c MFr Bld: 6.5 % (ref 4.6–6.5)

## 2011-12-11 MED ORDER — SITAGLIPTIN PHOS-METFORMIN HCL 50-1000 MG PO TABS: 1.0000 | ORAL_TABLET | Freq: Two times a day (BID) | ORAL | Status: DC

## 2011-12-11 MED ORDER — ZOSTER VACCINE LIVE 19400 UNT/0.65ML ~~LOC~~ SOLR: 0.6500 mL | Freq: Once | SUBCUTANEOUS | Status: AC

## 2011-12-11 MED ORDER — TESTOSTERONE 20.25 MG/ACT (1.62%) TD GEL: 2.0000 "application " | Freq: Every day | TRANSDERMAL | Status: DC

## 2011-12-11 NOTE — Assessment & Plan Note (Signed)
Took 1 tab twice a week of 50,000 u of vit D. Ran out a while back labs

## 2011-12-11 NOTE — Assessment & Plan Note (Addendum)
Td 06 Pneumonia shot --completed shingles immunization-- benefits discussed  Colonoscopy-- h/o polyps in colon, s/p several Cscopes , last 2011 w/ Dr Loreta Ave  (per patient)  DRE (-), r/o elevated PSA : labs  discussed diet and exercise

## 2011-12-11 NOTE — Assessment & Plan Note (Signed)
Due for labs

## 2011-12-11 NOTE — Assessment & Plan Note (Signed)
Base on last testosterone level, we increased his testosterone to 2 pumps Labs

## 2011-12-11 NOTE — Assessment & Plan Note (Signed)
At goal ?. BP today 146/88. Needs better control. See instructions.

## 2011-12-11 NOTE — Progress Notes (Signed)
  Subjective:    Patient ID: Jonathan Beath., male    DOB: 1936-03-29, 76 y.o.   MRN: [id number]  HPI Here for Medicare AWV: 1. Risk factors based on Past M, S, F history:reviewed 2. Physical Activities: active, no routine exercise , occasionally does walk in the treadmil 3. Depression/mood: denies problems, no problems noted today  4. Hearing: no problems noted today, denies issues himself  5. ADL's: totally independent  6. Fall Risk: low risk, no h/o falls 7. Home Safety: does feelsafe at home  8. Height, weight, &visual acuity: see VS, vision corrected w/ glasses, sees eye doctor 9. Counseling: yes, see below 10. Labs ordered based on risk factors: yes 11.       Referral Coordination, if needed 12.       Care Plan, see assessment and plan 13.            Cognitive Assessment, motor skills, memory and cognition seem appropriate  additionally, we discussed the following issues CAD-- saw Dr Myrtis Ser 9-12, stable from the CAD stand point , had some desaturation w/ exercise, pulmonary issue? Diabetes, good medication compliance, ambulatory to sugars around 100-110 in the morning, no low sugar symptoms Hypertension, good medication compliance, normal ambulatory BPs erectile dysfunction, vacuum device working well for him Hypogonadism, needs a refill of testosterone Vitamin D deficiency, taking 50,000 units of vitamin D twice a week. Needs a refill.  Past Medical History:  Diabetes mellitus-- dx aprox 2009  Hyperlipidemia--dx in the 90s  Hypertension-- dx in the 90  Vascular, Dr Myrtis Ser  --CAD s/p stents, 3rd RCA --> 2006  --PVD -- s/p stents at LE 2009  Polyps in colon, s/p several Cscopes , last 2011 w/ Dr Loreta Ave  ED -- has use a vacumm device  Hypogonadism --on testosterone, used to be  Rx by Cardiology  Shortness of breath ---O2 Sat dropped to 82% walking on the treadmill, September, 2012   Past Surgical History:  Appendectomy  Tonsillectomy   Social History:  Married , children,  4 and 2 step sons  Retired, he preaches  Former Smoker, quit 1987 (used to smoke 2ppd)  Alcohol use-yes-socially  Drug use-no  Regular exercise-- no but active   Family History: Heart Disease -- F  Diabetes --  GM, nephews, many family members  High cholesterol-- ? Hypertension-- F  stroke -- F  colon ca--no prostate ca-- no    Review of Systems No chest pain or shortness or breath. No claudication. No nausea, vomiting, diarrhea. No blood in the stools. No difficulty urinating or gross hematuria.    Objective:   Physical Exam  General:  alert, well-developed, and overweight-appearing.   Neck:  no masses, no thyromegaly, and normal carotid upstroke.   Lungs:  normal respiratory effort, no intercostal retractions, no accessory muscle use, and normal breath sounds.   Heart:  normal rate, regular rhythm, and no murmur.   Abdomen:  soft, non-tender, no distention, no masses, no guarding, and no rigidity.  no bruit DRE:  External examination normal, no masses, prostate is hard to reach but seems normal to palpation Neurological exam: Speech and motor are intact. Alert and oriented x3, no apparent distress.    Assessment & Plan:

## 2011-12-11 NOTE — Assessment & Plan Note (Addendum)
Based on the last A1c, he increased Amaryl to 4 mg. to decrease pill burden Will switch to janumet. See instructions. Labs

## 2011-12-11 NOTE — Patient Instructions (Signed)
Check the  blood pressure 2 or 3 times a week, be sure it is less than 135/85. If it is consistently higher, let me know ------ Once you finish metformin and januvia, stop them and take instead  JANUMET 1 tablet twice a day

## 2011-12-11 NOTE — Assessment & Plan Note (Signed)
PFTs show reactive airway disease, he is a former smoker, currently on spiriva, feeling better. Reassess exertional O2 sat on return to the office

## 2011-12-12 ENCOUNTER — Encounter: Payer: Self-pay | Admitting: Internal Medicine

## 2011-12-12 LAB — TESTOSTERONE, FREE, TOTAL, SHBG: Testosterone-% Free: 1.8 % (ref 1.6–2.9)

## 2011-12-12 LAB — VITAMIN D 25 HYDROXY (VIT D DEFICIENCY, FRACTURES): Vit D, 25-Hydroxy: 58 ng/mL (ref 30–89)

## 2012-01-07 DIAGNOSIS — M461 Sacroiliitis, not elsewhere classified: Secondary | ICD-10-CM | POA: Diagnosis not present

## 2012-01-07 DIAGNOSIS — M999 Biomechanical lesion, unspecified: Secondary | ICD-10-CM | POA: Diagnosis not present

## 2012-01-07 DIAGNOSIS — M5430 Sciatica, unspecified side: Secondary | ICD-10-CM | POA: Diagnosis not present

## 2012-01-08 DIAGNOSIS — M5430 Sciatica, unspecified side: Secondary | ICD-10-CM | POA: Diagnosis not present

## 2012-01-08 DIAGNOSIS — M999 Biomechanical lesion, unspecified: Secondary | ICD-10-CM | POA: Diagnosis not present

## 2012-01-08 DIAGNOSIS — M461 Sacroiliitis, not elsewhere classified: Secondary | ICD-10-CM | POA: Diagnosis not present

## 2012-01-09 DIAGNOSIS — M5430 Sciatica, unspecified side: Secondary | ICD-10-CM | POA: Diagnosis not present

## 2012-01-09 DIAGNOSIS — M461 Sacroiliitis, not elsewhere classified: Secondary | ICD-10-CM | POA: Diagnosis not present

## 2012-01-09 DIAGNOSIS — M999 Biomechanical lesion, unspecified: Secondary | ICD-10-CM | POA: Diagnosis not present

## 2012-01-18 ENCOUNTER — Telehealth: Payer: Self-pay | Admitting: *Deleted

## 2012-01-18 MED ORDER — SIMVASTATIN 20 MG PO TABS: 20.0000 mg | ORAL_TABLET | Freq: Every day | ORAL | Status: DC

## 2012-01-18 MED ORDER — TESTOSTERONE 20.25 MG/ACT (1.62%) TD GEL: 2.0000 "application " | Freq: Every day | TRANSDERMAL | Status: DC

## 2012-01-18 MED ORDER — METOPROLOL SUCCINATE ER 100 MG PO TB24: 100.0000 mg | ORAL_TABLET | Freq: Every day | ORAL | Status: DC

## 2012-01-18 NOTE — Telephone Encounter (Signed)
Refill done.  

## 2012-01-21 ENCOUNTER — Other Ambulatory Visit: Payer: Self-pay | Admitting: *Deleted

## 2012-01-21 ENCOUNTER — Ambulatory Visit (INDEPENDENT_AMBULATORY_CARE_PROVIDER_SITE_OTHER): Payer: Medicare Other | Admitting: Cardiology

## 2012-01-21 ENCOUNTER — Encounter: Payer: Self-pay | Admitting: Cardiology

## 2012-01-21 VITALS — BP 152/92 | HR 73 | Ht 74.0 in | Wt 253.0 lb

## 2012-01-21 DIAGNOSIS — I2510 Atherosclerotic heart disease of native coronary artery without angina pectoris: Secondary | ICD-10-CM | POA: Diagnosis not present

## 2012-01-21 DIAGNOSIS — I739 Peripheral vascular disease, unspecified: Secondary | ICD-10-CM | POA: Diagnosis not present

## 2012-01-21 MED ORDER — TIOTROPIUM BROMIDE MONOHYDRATE 18 MCG IN CAPS: 18.0000 ug | ORAL_CAPSULE | Freq: Every day | RESPIRATORY_TRACT | Status: DC

## 2012-01-21 NOTE — Assessment & Plan Note (Signed)
The patient is not having any significant symptoms in his legs. I've encouraged him to follow through with the followup Doppler that has been scheduled with his prior doctor so that I have continuity and the information available. He will then get the results to me and we will follow him in the future.

## 2012-01-21 NOTE — Telephone Encounter (Signed)
Pt aware Rx sent.  

## 2012-01-21 NOTE — Assessment & Plan Note (Signed)
Coronary disease is stable. No change in therapy. 

## 2012-01-21 NOTE — Patient Instructions (Signed)
Your physician recommends that you schedule a follow-up appointment in: Keep your September appt with Dr Myrtis Ser

## 2012-01-21 NOTE — Progress Notes (Signed)
HPI Patient is seen today to followup coronary disease and peripheral vascular disease. He's actually feeling well. I had taken over his cardiology care at his request in September, 2012. I carefully reviewed the information available to me at that time. His overall cardiac status was stable. Today he returns and he is feeling well. He's not having any chest pain or shortness of breath. I was aware that he had had a stent placed to his left lower extremity in 2009. He brought me more information today and related to me that he has had followup yearly vascular studies for his lower extremities with his prior cardiology team. I do not know the specifics of what was being reviewed. He has one scheduled with him for June, 2013. I've encouraged him to go ahead and have the study and then have the complete report sent to me. This will enable careful comparison data that I can use going forward.  No Known Allergies  Current Outpatient Prescriptions  Medication Sig Dispense Refill  . amLODipine (NORVASC) 10 MG tablet Take 1 tablet (10 mg total) by mouth daily.  90 tablet  1  . aspirin 81 MG tablet Take 81 mg by mouth daily.        . clopidogrel (PLAVIX) 75 MG tablet Take 1 tablet (75 mg total) by mouth daily.  90 tablet  1  . ergocalciferol (VITAMIN D2) 50000 UNITS capsule Take 50,000 Units by mouth. 1 by mouth every mon and thurs       . glimepiride (AMARYL) 4 MG tablet Take 1 tablet (4 mg total) by mouth daily before breakfast.  90 tablet  0  . losartan (COZAAR) 100 MG tablet Take 1 tablet (100 mg total) by mouth daily.  90 tablet  0  . metoprolol succinate (TOPROL-XL) 100 MG 24 hr tablet Take 1 tablet (100 mg total) by mouth daily.  90 tablet  3  . simvastatin (ZOCOR) 20 MG tablet Take 1 tablet (20 mg total) by mouth at bedtime.  90 tablet  3  . sitaGLIPtan-metformin (JANUMET) 50-1000 MG per tablet Take 1 tablet by mouth 2 (two) times daily with a meal.  180 tablet  3  . Testosterone 20.25 MG/ACT  (1.62%) GEL Place 2 application onto the skin daily.  75 g  0  . tiotropium (SPIRIVA HANDIHALER) 18 MCG inhalation capsule Place 1 capsule (18 mcg total) into inhaler and inhale daily.  30 capsule  2  . triamterene-hydrochlorothiazide (MAXZIDE-25) 37.5-25 MG per tablet Take 1 tablet by mouth daily.        Marland Kitchen DISCONTD: metFORMIN (GLUCOPHAGE) 1000 MG tablet Take 1 tablet (1,000 mg total) by mouth 2 (two) times daily with a meal.  180 tablet  3  . DISCONTD: sitaGLIPtan (JANUVIA) 100 MG tablet Take 1 tablet (100 mg total) by mouth daily.  90 tablet  1    History   Social History  . Marital Status: Married    Spouse Name: N/A    Number of Children: 6  . Years of Education: N/A   Occupational History  . retired     he preaches    Social History Main Topics  . Smoking status: Former Smoker -- 2.0 packs/day    Quit date: 06/17/1976  . Smokeless tobacco: Never Used  . Alcohol Use: Yes     socially   . Drug Use: No  . Sexually Active: Not on file   Other Topics Concern  . Not on file   Social History Narrative  Has 2 step sons, and 4 childrenRegular exercise- no but active     Family History  Problem Relation Age of Onset  . Heart disease Father   . Diabetes      GM, nephews, many family members  . Hyperlipidemia      ?  Marland Kitchen Hypertension Father   . Stroke Father   . Colon cancer Neg Hx   . Prostate cancer Neg Hx     Past Medical History  Diagnosis Date  . CAD (coronary artery disease)     Two RCA stents remotely / 3rd RCA stent 2006  . PVD (peripheral vascular disease)     s/p stents at LE 2009, Dr Jacinto Halim  . Colon polyps     s/p several Cscopes.  . Diabetes mellitus     dx aprox 2009  . Hyperlipidemia     dx in 90s  . Hypertension     dx in the 90  . ED (erectile dysfunction)     has a vacumm device  . Hypogonadism male   . Shortness of breath     O2 Sat dropped to 82% walking on the treadmill, September, 2012    Past Surgical History  Procedure Date  .  Appendectomy   . Tonsillectomy     ROS  Patient denies fever, chills, headache, sweats, rash, change in vision, change in hearing, chest pain, cough, nausea vomiting, urinary symptoms. All other systems are reviewed and are negative.  PHYSICAL EXAM Patient is oriented to person time and place. Affect is normal. There is no jugulovenous distention. Lungs are clear. Respiratory effort is nonlabored. Cardiac exam reveals S1 and S2. There no clicks or significant murmurs. The abdomen is soft. There is no peripheral edema.    Filed Vitals:   01/21/12 1357  BP: 152/92  Pulse: 73  Height: 6\' 2"  (1.88 m)  Weight: 253 lb (114.76 kg)   EKG is done today and reviewed by me. There are nonspecific ST changes. There is no significant change.  ASSESSMENT & PLAN

## 2012-02-15 ENCOUNTER — Telehealth: Payer: Self-pay | Admitting: *Deleted

## 2012-02-15 NOTE — Telephone Encounter (Signed)
Prior Auth Denied because there is no documentation that the participant has tried fortesta for a minimum of 90 days and failed to achieve total Testerone levels above 400 ng/dl drawn 2 hours after fortesta application and without improvement in symptoms.

## 2012-02-18 NOTE — Telephone Encounter (Signed)
Let me know if he agrees to start fortesta he will need 4 pumps daily

## 2012-02-18 NOTE — Telephone Encounter (Signed)
Advice patient, his insurance likes him to take [place] instead of AndroGel. If he is in agreement discontinue AndroGel and start  Fortesta

## 2012-02-19 DIAGNOSIS — I70219 Atherosclerosis of native arteries of extremities with intermittent claudication, unspecified extremity: Secondary | ICD-10-CM | POA: Diagnosis not present

## 2012-02-19 NOTE — Telephone Encounter (Signed)
Left message to call office

## 2012-02-20 MED ORDER — TESTOSTERONE 10 MG/ACT (2%) TD GEL: 4.0000 | Freq: Every day | TRANSDERMAL | Status: DC

## 2012-02-20 NOTE — Telephone Encounter (Signed)
Addended by: Candie Echevaria L on: 02/20/2012 10:32 AM   Modules accepted: Orders

## 2012-02-20 NOTE — Telephone Encounter (Signed)
Discuss with patient med change. Rx sent

## 2012-02-27 DIAGNOSIS — E119 Type 2 diabetes mellitus without complications: Secondary | ICD-10-CM | POA: Diagnosis not present

## 2012-02-27 DIAGNOSIS — M79609 Pain in unspecified limb: Secondary | ICD-10-CM | POA: Diagnosis not present

## 2012-02-27 DIAGNOSIS — B351 Tinea unguium: Secondary | ICD-10-CM | POA: Diagnosis not present

## 2012-02-27 DIAGNOSIS — L608 Other nail disorders: Secondary | ICD-10-CM | POA: Diagnosis not present

## 2012-03-03 DIAGNOSIS — M461 Sacroiliitis, not elsewhere classified: Secondary | ICD-10-CM | POA: Diagnosis not present

## 2012-03-03 DIAGNOSIS — M999 Biomechanical lesion, unspecified: Secondary | ICD-10-CM | POA: Diagnosis not present

## 2012-03-03 DIAGNOSIS — M5430 Sciatica, unspecified side: Secondary | ICD-10-CM | POA: Diagnosis not present

## 2012-03-04 DIAGNOSIS — M5430 Sciatica, unspecified side: Secondary | ICD-10-CM | POA: Diagnosis not present

## 2012-03-04 DIAGNOSIS — M461 Sacroiliitis, not elsewhere classified: Secondary | ICD-10-CM | POA: Diagnosis not present

## 2012-03-04 DIAGNOSIS — M999 Biomechanical lesion, unspecified: Secondary | ICD-10-CM | POA: Diagnosis not present

## 2012-03-06 DIAGNOSIS — M5430 Sciatica, unspecified side: Secondary | ICD-10-CM | POA: Diagnosis not present

## 2012-03-06 DIAGNOSIS — M461 Sacroiliitis, not elsewhere classified: Secondary | ICD-10-CM | POA: Diagnosis not present

## 2012-03-06 DIAGNOSIS — M999 Biomechanical lesion, unspecified: Secondary | ICD-10-CM | POA: Diagnosis not present

## 2012-03-12 ENCOUNTER — Encounter: Payer: Self-pay | Admitting: Internal Medicine

## 2012-03-12 ENCOUNTER — Ambulatory Visit (INDEPENDENT_AMBULATORY_CARE_PROVIDER_SITE_OTHER): Payer: Medicare Other | Admitting: Internal Medicine

## 2012-03-12 VITALS — BP 136/82 | HR 67 | Temp 97.9°F | Wt 255.0 lb

## 2012-03-12 DIAGNOSIS — E785 Hyperlipidemia, unspecified: Secondary | ICD-10-CM

## 2012-03-12 DIAGNOSIS — I2510 Atherosclerotic heart disease of native coronary artery without angina pectoris: Secondary | ICD-10-CM

## 2012-03-12 DIAGNOSIS — E109 Type 1 diabetes mellitus without complications: Secondary | ICD-10-CM

## 2012-03-12 DIAGNOSIS — E291 Testicular hypofunction: Secondary | ICD-10-CM

## 2012-03-12 DIAGNOSIS — I10 Essential (primary) hypertension: Secondary | ICD-10-CM | POA: Diagnosis not present

## 2012-03-12 DIAGNOSIS — R0602 Shortness of breath: Secondary | ICD-10-CM

## 2012-03-12 LAB — BASIC METABOLIC PANEL
CO2: 28 mEq/L (ref 19–32)
Chloride: 108 mEq/L (ref 96–112)
Creatinine, Ser: 1.1 mg/dL (ref 0.4–1.5)
Glucose, Bld: 65 mg/dL — ABNORMAL LOW (ref 70–99)

## 2012-03-12 LAB — AST: AST: 17 U/L (ref 0–37)

## 2012-03-12 NOTE — Assessment & Plan Note (Signed)
Excellent control per last cholesterol panel, check liver tests

## 2012-03-12 NOTE — Assessment & Plan Note (Signed)
Seems to be well-controlled, check a BMP. Continue with same medicines

## 2012-03-12 NOTE — Progress Notes (Signed)
  Subjective:    Patient ID: Jonathan Beath., male    DOB: [birth year], 76 y.o.   MRN: [id number]  HPI Routine office visit History of heart disease, saw cardiology not long ago, he was stable. Diabetes, good medication compliance, blood sugars within normal. CBG yesterday 98. Hypogonadism, we were forced to change testosterone formulation. Good compliance. Hypertension, no ambulatory BPs, BP today well-controlled.  Past Medical History:  Diabetes mellitus-- dx aprox 2009  Hyperlipidemia--dx in the 90s  Hypertension-- dx in the 90  Vascular, Dr Myrtis Ser  --CAD s/p stents, 3rd RCA --> 2006  --PVD -- s/p stents at LE 2009  Polyps in colon, s/p several Cscopes , last 2011 w/ Dr Loreta Ave  ED -- has use a vacumm device  Hypogonadism --on testosterone, used to be Rx by Cardiology  Shortness of breath ---O2 Sat dropped to 82% walking on the treadmill, September, 2012  Past Surgical History:  Appendectomy  Tonsillectomy  Social History:  Married , children, 4 and 2 step sons  Retired, he preaches  Former Smoker, quit 1987 (used to smoke 2ppd)  Alcohol use-yes-socially  Drug use-no  Regular exercise-- no but active  Family History:  Heart Disease -- F  Diabetes -- GM, nephews, many family members  High cholesterol-- ?  Hypertension-- F  stroke -- F  colon ca--no  prostate ca-- no   Review of Systems Denies chest pain or shortness of breath has a history of hypoxemia with exertion however he denies dyspnea on exertion or cough. He sleeps well and wakes up rested; I tried to get a sense of how much  he is able to exercise but he has a sedentary lifestyle.    Objective:   Physical Exam  General -- alert, well-developed No apparent distress.  Lungs -- normal respiratory effort, no intercostal retractions, no accessory muscle use, and normal breath sounds.   Heart-- normal rate, regular rhythm, no murmur, and no gallop.   DIABETIC FEET EXAM: No lower extremity edema Normal pedal pulses  bilaterally Skin and nails are normal without calluses Pinprick examination of the feet normal. Neurologic-- alert & oriented X3 and strength normal in all extremities. Psych-- Cognition and judgment appear intact. Alert and cooperative with normal attention span and concentration.  not anxious appearing and not depressed appearing.         Assessment & Plan:

## 2012-03-12 NOTE — Assessment & Plan Note (Addendum)
Under excellent control, check an A1c again. Reports he has  an eye exam scheduled  very soon. Feet exam 03/12/2012 normal

## 2012-03-12 NOTE — Patient Instructions (Signed)
Please come back in 2 months, at about 1 PM for labs: Testosterone-- dx hypogonadism

## 2012-03-12 NOTE — Assessment & Plan Note (Signed)
Asymptomatic, saw cardiology a few months ago, stable.

## 2012-03-12 NOTE — Assessment & Plan Note (Signed)
Due to insurance constraints we had to change his testosterone formulation. He started a new testosterone few weeks ago, too early to change. Will check in 2 months.

## 2012-03-12 NOTE — Assessment & Plan Note (Addendum)
O2 sat 94% at rest, dropped to 87% after a minute after exertion The patient remains completely asymptomatic, see review of systems. Plan: 24-hour pulse oximetry. Addendum, when I discussed the issue with the patient, he stated he will not use oxygen even a prescribed consequently won't order a pulse oximetry and reassess from time to time

## 2012-03-18 ENCOUNTER — Ambulatory Visit (INDEPENDENT_AMBULATORY_CARE_PROVIDER_SITE_OTHER): Payer: Medicare Other | Admitting: Internal Medicine

## 2012-03-18 VITALS — BP 158/88 | HR 69 | Temp 98.1°F | Wt 259.0 lb

## 2012-03-18 DIAGNOSIS — R0602 Shortness of breath: Secondary | ICD-10-CM

## 2012-03-18 MED ORDER — BUDESONIDE-FORMOTEROL FUMARATE 80-4.5 MCG/ACT IN AERO: 2.0000 | INHALATION_SPRAY | Freq: Two times a day (BID) | RESPIRATORY_TRACT | Status: DC

## 2012-03-18 NOTE — Patient Instructions (Addendum)
Please get your x-ray at the other [place]  office located at: [address] [place], across from [facility].  Please go to the basement, this is a walk-in facility, they are open from 8:30 to 5:30 PM. Phone number [phone]. --- Start the new inhaler 2 puffs twice a day

## 2012-03-18 NOTE — Progress Notes (Signed)
  Subjective:    Patient ID: Jonathan [name]., male    DOB: 10-15-1935, 76 y.o.   MRN: [id number]  HPI Acute visit The last time he was here, he declined to have a 24-hour pulse oximetry to further workup his  hypoxemia because he was asymptomatic however since then he become more conscious of his breathing and now complains of dyspnea on exertion and  likes further testing.  Past Medical History:  Diabetes mellitus-- dx aprox 2009  Hyperlipidemia--dx in the 90s  Hypertension-- dx in the 90  Vascular, Dr Myrtis Ser  --CAD s/p stents, 3rd RCA --> 2006  --PVD -- s/p stents at LE 2009  Polyps in colon, s/p several Cscopes , last 2011 w/ Dr Loreta Ave  ED -- has use a vacumm device  Hypogonadism --on testosterone, used to be Rx by Cardiology  Shortness of breath ---O2 Sat dropped to 82% walking on the treadmill, September, 2012   Past Surgical History:  Appendectomy  Tonsillectomy   Social History:  Married , children, 4 and 2 step sons  Retired, he preaches  Former Smoker, quit 1987 (used to smoke 2ppd)  Alcohol use-yes-socially  Drug use-no  Regular exercise-- no but active   Family History:  Heart Disease -- F  Diabetes -- GM, nephews, many family members  High cholesterol-- ?  Hypertension-- F  stroke -- F  colon ca--no  prostate ca-- no   Review of Systems Denies cough, complaint is frequent wheezing.    Objective:   Physical Exam . General -- alert, well-developed No apparent distress.  Lungs -- normal respiratory effort, no intercostal retractions, no accessory muscle use, and normal breath sounds with the exception of few rhonchi with cough. Heart-- normal rate, regular rhythm, no murmur, and no gallop.        Assessment & Plan:

## 2012-03-18 NOTE — Assessment & Plan Note (Signed)
See previous entries: History of exercise-induced hypoxemia, previously he told me he was asymptomatic but today he recognizes dyspnea on exertion and wheezing. At this point he requests further testing. Plan: Chest x-ray Refer to pulmonary

## 2012-03-19 ENCOUNTER — Ambulatory Visit (INDEPENDENT_AMBULATORY_CARE_PROVIDER_SITE_OTHER)
Admission: RE | Admit: 2012-03-19 | Discharge: 2012-03-19 | Disposition: A | Discharge status: Home / Self Care | Payer: Medicare Other | Source: Ambulatory Visit | Attending: Internal Medicine | Admitting: Internal Medicine

## 2012-03-19 ENCOUNTER — Encounter: Payer: Self-pay | Admitting: Internal Medicine

## 2012-03-19 DIAGNOSIS — R0602 Shortness of breath: Secondary | ICD-10-CM | POA: Diagnosis not present

## 2012-03-19 DIAGNOSIS — J449 Chronic obstructive pulmonary disease, unspecified: Secondary | ICD-10-CM | POA: Diagnosis not present

## 2012-04-10 ENCOUNTER — Other Ambulatory Visit: Payer: Self-pay | Admitting: *Deleted

## 2012-04-10 DIAGNOSIS — R0602 Shortness of breath: Secondary | ICD-10-CM

## 2012-04-10 MED ORDER — BUDESONIDE-FORMOTEROL FUMARATE 80-4.5 MCG/ACT IN AERO: 2.0000 | INHALATION_SPRAY | Freq: Two times a day (BID) | RESPIRATORY_TRACT | Status: DC

## 2012-04-10 NOTE — Telephone Encounter (Signed)
Refill done.  

## 2012-05-01 ENCOUNTER — Ambulatory Visit (INDEPENDENT_AMBULATORY_CARE_PROVIDER_SITE_OTHER): Payer: Medicare Other | Admitting: Internal Medicine

## 2012-05-01 ENCOUNTER — Encounter: Payer: Self-pay | Admitting: Internal Medicine

## 2012-05-01 VITALS — BP 120/78 | HR 72 | Ht 74.0 in | Wt 260.2 lb

## 2012-05-01 DIAGNOSIS — M5430 Sciatica, unspecified side: Secondary | ICD-10-CM | POA: Diagnosis not present

## 2012-05-01 DIAGNOSIS — M461 Sacroiliitis, not elsewhere classified: Secondary | ICD-10-CM | POA: Diagnosis not present

## 2012-05-01 DIAGNOSIS — J45909 Unspecified asthma, uncomplicated: Secondary | ICD-10-CM | POA: Diagnosis not present

## 2012-05-01 DIAGNOSIS — R0602 Shortness of breath: Secondary | ICD-10-CM | POA: Diagnosis not present

## 2012-05-01 DIAGNOSIS — M999 Biomechanical lesion, unspecified: Secondary | ICD-10-CM | POA: Diagnosis not present

## 2012-05-01 NOTE — Patient Instructions (Addendum)
Order- schedule 6 MWT dx  Asthma with bronchitis  Suggest you continue Spiriva once daily                      Stop Symbicort for now. You might try restarting it in a week, to see if you can then recognize that it adds any value to your breathing.

## 2012-05-01 NOTE — Progress Notes (Signed)
05/01/12- 40 yoM former smoker referred by Dr. Drue Novel for SOB.  2 PPD until 1987 Patient c/o sob and wheezing. Denies wheezing, chest pain, and chest tightness. He says he has not been as aware of shortness of breath as Dr. Drue Novel. In [employer] service or nurse commented on his labored breathing. He is not snore and does not make loud breathing noises but apparently is perceived increased respiratory effort and some wheeze. This is perennial and may be worse supine. His breathing is not waking during the night and he denies cough, choking or strangling. He is able to walk long distances. He tried Spiriva and Symbicort without effect. Denies any history of pneumonia, asthma or an anemia. History of myocardial infarction with coronary and femoral stents. He does not notice claudication. PFT- 07/10/11-  Normal spirometry flows with small- airway response to bronchodilator, normal lung volumes and moderately reduced diffusion. FEV1/FVC 0.79, DLCO 58% CXR 03/19/12-reviewed with him CHEST - 2 VIEW  Comparison: 06/28/2008  Findings: The heart size and mediastinal contours are within normal  limits. Both lungs are clear. The visualized skeletal structures  are unremarkable.  IMPRESSION:  Negative exam.  Original Report Authenticated By: Rosealee Albee, M.D.  REST Stress Myoview -05/25/11 Impression  Exercise Capacity: Good exercise capacity.  BP Response: Normal blood pressure response.  Clinical Symptoms: Mild chest pain/dyspnea.  ECG Impression: No significant ST segment change suggestive of ischemia.  Comparison with Prior Nuclear Study: No images to compare  Overall Impression: Small inferior wall infarct with mild periinfarct ischemia    Prior to Admission medications   Medication Sig Start Date End Date Taking? Authorizing Provider  amLODipine (NORVASC) 10 MG tablet Take 1 tablet (10 mg total) by mouth daily. 01/19/11  Yes Wanda Plump, MD  aspirin 81 MG tablet Take 81 mg by mouth daily.     Yes Historical  Provider, MD  budesonide-formoterol (SYMBICORT) 80-4.5 MCG/ACT inhaler Inhale 2 puffs into the lungs 2 (two) times daily. 04/10/12 04/10/13 Yes Wanda Plump, MD  clopidogrel (PLAVIX) 75 MG tablet Take 1 tablet (75 mg total) by mouth daily. 01/19/11  Yes Wanda Plump, MD  ergocalciferol (VITAMIN D2) 50000 UNITS capsule Take 50,000 Units by mouth. 1 by mouth every mon and thurs    Yes Historical Provider, MD  glimepiride (AMARYL) 4 MG tablet Take 1 tablet (4 mg total) by mouth daily before breakfast. 11/20/11 11/19/12 Yes Wanda Plump, MD  losartan (COZAAR) 100 MG tablet Take 1 tablet (100 mg total) by mouth daily. 11/20/11  Yes Wanda Plump, MD  metoprolol succinate (TOPROL-XL) 100 MG 24 hr tablet Take 1 tablet (100 mg total) by mouth daily. 01/18/12  Yes Wanda Plump, MD  simvastatin (ZOCOR) 20 MG tablet Take 1 tablet (20 mg total) by mouth at bedtime. 01/18/12  Yes Wanda Plump, MD  sitaGLIPtan-metformin (JANUMET) 50-1000 MG per tablet Take 1 tablet by mouth 2 (two) times daily with a meal. 12/11/11 12/10/12 Yes Wanda Plump, MD  Testosterone (FORTESTA) 10 MG/ACT (2%) GEL Place 4 each onto the skin daily. 4 pumps 02/20/12  Yes Wanda Plump, MD  tiotropium (SPIRIVA HANDIHALER) 18 MCG inhalation capsule Place 1 capsule (18 mcg total) into inhaler and inhale daily. 01/21/12 01/20/13 Yes Wanda Plump, MD  triamterene-hydrochlorothiazide (MAXZIDE-25) 37.5-25 MG per tablet Take 1 tablet by mouth daily.     Yes Historical Provider, MD   Past Medical History  Diagnosis Date  . CAD (coronary artery disease)  Two RCA stents remotely / 3rd RCA stent 2006  . PVD (peripheral vascular disease)     s/p stents at LE 2009, Dr Jacinto Halim  . Colon polyps     s/p several Cscopes.  . Diabetes mellitus     dx aprox 2009  . Hyperlipidemia     dx in 90s  . Hypertension     dx in the 90  . ED (erectile dysfunction)     has a vacumm device  . Hypogonadism male   . Shortness of breath     O2 Sat dropped to 82% walking on the treadmill, September, 2012     Past Surgical History  Procedure Date  . Appendectomy   . Tonsillectomy    Family History  Problem Relation Age of Onset  . Heart disease Father   . Diabetes      GM, nephews, many family members  . Hyperlipidemia      ?  Marland Kitchen Hypertension Father   . Stroke Father   . Colon cancer Neg Hx   . Prostate cancer Neg Hx    History   Social History  . Marital Status: Married    Spouse Name: N/A    Number of Children: 6  . Years of Education: N/A   Occupational History  . retired     he preaches    Social History Main Topics  . Smoking status: Former Smoker -- 2.0 packs/day    Types: Cigarettes    Quit date: 06/17/1976  . Smokeless tobacco: Never Used  . Alcohol Use: Yes     socially   . Drug Use: No  . Sexually Active: Not on file   Other Topics Concern  . Not on file   Social History Narrative   Has 2 step sons, and 4 childrenRegular exercise- no but active    ROS-see HPI Constitutional:   No-   weight loss, night sweats, fevers, chills, fatigue, lassitude. HEENT:   No-  headaches, difficulty swallowing, tooth/dental problems, sore throat,       No-  sneezing, itching, ear ache, nasal congestion, post nasal drip,  CV:  No-   chest pain, orthopnea, PND, swelling in lower extremities, anasarca, dizziness, palpitations Resp:+ "sometimes"shortness of breath with exertion or at rest.              No-   productive cough,  No non-productive cough,  No- coughing up of blood.              No-   change in color of mucus.  No- wheezing.   Skin: No-   rash or lesions. GI:  No-   heartburn, indigestion, abdominal pain, nausea, vomiting, diarrhea,                 change in bowel habits, loss of appetite GU: No-   dysuria, change in color of urine, no urgency or frequency.  No- flank pain. MS:  No-   joint pain or swelling.  No- decreased range of motion.  No- back pain. Neuro-     nothing unusual Psych:  No- change in mood or affect. No depression or anxiety.  No memory  loss.  OBJ- Physical Exam General- Alert, Oriented, Affect-appropriate, Distress- none acute Skin- rash-none, lesions- none, excoriation- none Lymphadenopathy- none Head- atraumatic            Eyes- Gross vision intact, PERRLA, conjunctivae and secretions clear            Ears- Hearing,  canals-normal            Nose- Clear, no-Septal dev, mucus, polyps, erosion, perforation             Throat- Mallampati II , mucosa clear , drainage- none, tonsils- atrophic Neck- flexible , trachea midline, no stridor , thyroid nl, carotid no bruit Chest - symmetrical excursion , unlabored           Heart/CV- RRR , no murmur , no gallop  , no rub, nl s1 s2                           - JVD- none , edema- none, stasis changes- none, varices- none           Lung- clear to P&A, wheeze- none, cough- none , dullness-none, rub- none           Chest wall-  Abd- tender-no, distended-no, bowel sounds-present, HSM- no Br/ Gen/ Rectal- Not done, not indicated Extrem- cyanosis- none, clubbing, none, atrophy- none, strength- nl Neuro- grossly intact to observation

## 2012-05-07 NOTE — Assessment & Plan Note (Signed)
Desaturation with exercise suggests a ventilation perfusion deficit, probably from obstructive airways disease 2 peripheral to be recognized by the PFTs but resulting in impaired diffusion capacity. Oxygen desaturation with exercise is most consistent with this mechanism or with exercise related shunting. An additional component of fatigue could come from peripheral arterial insufficiency but would not be expected to drop his oxygen saturation. Plan-6 minute walk test, discussed Spiriva

## 2012-05-12 ENCOUNTER — Other Ambulatory Visit: Payer: Medicare Other

## 2012-05-12 DIAGNOSIS — E291 Testicular hypofunction: Secondary | ICD-10-CM | POA: Diagnosis not present

## 2012-05-13 LAB — TESTOSTERONE, FREE, TOTAL, SHBG
Sex Hormone Binding: 47 nmol/L (ref 13–71)
Testosterone, Free: 92.3 pg/mL (ref 47.0–244.0)

## 2012-05-21 ENCOUNTER — Ambulatory Visit: Payer: Medicare Other | Admitting: Cardiology

## 2012-05-27 DIAGNOSIS — M999 Biomechanical lesion, unspecified: Secondary | ICD-10-CM | POA: Diagnosis not present

## 2012-05-27 DIAGNOSIS — M5430 Sciatica, unspecified side: Secondary | ICD-10-CM | POA: Diagnosis not present

## 2012-05-27 DIAGNOSIS — M461 Sacroiliitis, not elsewhere classified: Secondary | ICD-10-CM | POA: Diagnosis not present

## 2012-06-05 ENCOUNTER — Telehealth: Payer: Self-pay | Admitting: Internal Medicine

## 2012-06-05 DIAGNOSIS — R0602 Shortness of breath: Secondary | ICD-10-CM

## 2012-06-05 NOTE — Telephone Encounter (Signed)
LM ON triage line [id number] needs refill SYMB[facility]ORT [facility]b# [phone]

## 2012-06-06 MED ORDER — BUDESONIDE-FORMOTEROL FUMARATE 80-4.5 MCG/ACT IN AERO: 2.0000 | INHALATION_SPRAY | Freq: Two times a day (BID) | RESPIRATORY_TRACT | Status: DC

## 2012-06-06 NOTE — Telephone Encounter (Signed)
Refill done.  

## 2012-06-10 ENCOUNTER — Other Ambulatory Visit: Payer: Self-pay | Admitting: Internal Medicine

## 2012-06-10 NOTE — Telephone Encounter (Signed)
Refill done.  

## 2012-06-10 NOTE — Telephone Encounter (Signed)
Ok RF 6 month supply

## 2012-06-10 NOTE — Telephone Encounter (Signed)
Ok to refill 

## 2012-06-16 ENCOUNTER — Encounter: Payer: Self-pay | Admitting: Cardiology

## 2012-06-16 ENCOUNTER — Ambulatory Visit (INDEPENDENT_AMBULATORY_CARE_PROVIDER_SITE_OTHER): Payer: Medicare Other | Admitting: Cardiology

## 2012-06-16 ENCOUNTER — Telehealth: Payer: Self-pay | Admitting: Cardiology

## 2012-06-16 VITALS — BP 175/102 | HR 77 | Ht 74.0 in | Wt 264.0 lb

## 2012-06-16 DIAGNOSIS — I2510 Atherosclerotic heart disease of native coronary artery without angina pectoris: Secondary | ICD-10-CM | POA: Diagnosis not present

## 2012-06-16 DIAGNOSIS — I10 Essential (primary) hypertension: Secondary | ICD-10-CM | POA: Diagnosis not present

## 2012-06-16 DIAGNOSIS — R0602 Shortness of breath: Secondary | ICD-10-CM | POA: Diagnosis not present

## 2012-06-16 DIAGNOSIS — I739 Peripheral vascular disease, unspecified: Secondary | ICD-10-CM

## 2012-06-16 MED ORDER — AMLODIPINE BESYLATE 10 MG PO TABS: 10.0000 mg | ORAL_TABLET | Freq: Every day | ORAL | Status: DC

## 2012-06-16 MED ORDER — AMLODIPINE BESYLATE 5 MG PO TABS: 5.0000 mg | ORAL_TABLET | Freq: Every day | ORAL | Status: DC

## 2012-06-16 NOTE — Assessment & Plan Note (Signed)
His blood pressure is elevated today. There was borderline elevation I saw him last. He is fatigued from a long trip. However am concerned about this pressure. We will start additional medication and see him for followup.

## 2012-06-16 NOTE — Telephone Encounter (Signed)
No answer and no way to leave message. Will continue to call. Per Dr. Myrtis Ser, pt. should take Amlodipine 10 mg daily.

## 2012-06-16 NOTE — Assessment & Plan Note (Signed)
Clinically his peripheral vascular disease is stable. He's had some Dopplers and we need to try to find these results. Then he and I will decide about the long-term followup.

## 2012-06-16 NOTE — Assessment & Plan Note (Signed)
Coronary disease is stable. No change in therapy. 

## 2012-06-16 NOTE — Patient Instructions (Addendum)
**Note De-Identified Shirline Kendle Obfuscation** Your physician has recommended you make the following change in your medication: start taking Amlodipine 5 mg daily  Your physician recommends that you schedule a follow-up appointment in: 5 weeks

## 2012-06-16 NOTE — Telephone Encounter (Signed)
Pt also now calling re rx that was given to him this am, pls call [phone]

## 2012-06-16 NOTE — Telephone Encounter (Signed)
New Problem:    Patient called in to say he was seen at [facility] by Dr. Nanetta Batty and his leg doppler was performed on 02/19/11.  Please call back.

## 2012-06-16 NOTE — Assessment & Plan Note (Signed)
He has an ongoing assessment by Dr. Maple Hudson concerning his shortness of breath. I feel that it is not cardiac.

## 2012-06-16 NOTE — Progress Notes (Signed)
HPI  Patient is seen today to followup coronary disease and peripheral vascular disease. He's actually doing well. He had further noninvasive vascular studies in June, 2013. However he has not received any of the copies. At this point I cannot find these results. We are trying to find who has actually done the studies. He's not having any marked claudication.  He's not having any significant chest pain. He does have shortness of breath. He is being evaluated by Dr. Maple Hudson. At some point he is to have a 6 minute walk test. He says that he breathes better when he was in [place] recently.  No Known Allergies  Current Outpatient Prescriptions  Medication Sig Dispense Refill  . amLODipine (NORVASC) 10 MG tablet Take 1 tablet (10 mg total) by mouth daily.  90 tablet  1  . aspirin 81 MG tablet Take 81 mg by mouth daily.        . budesonide-formoterol (SYMBICORT) 80-4.5 MCG/ACT inhaler Inhale 2 puffs into the lungs 2 (two) times daily.  1 Inhaler  3  . clopidogrel (PLAVIX) 75 MG tablet Take 1 tablet (75 mg total) by mouth daily.  90 tablet  1  . FORTESTA 10 MG/ACT (2%) GEL APPLY 4 PUMPS TOPICALLY DAILY  60 g  5  . glimepiride (AMARYL) 4 MG tablet Take 1 tablet (4 mg total) by mouth daily before breakfast.  90 tablet  0  . losartan (COZAAR) 100 MG tablet Take 1 tablet (100 mg total) by mouth daily.  90 tablet  0  . metoprolol succinate (TOPROL-XL) 100 MG 24 hr tablet Take 1 tablet (100 mg total) by mouth daily.  90 tablet  3  . simvastatin (ZOCOR) 20 MG tablet Take 1 tablet (20 mg total) by mouth at bedtime.  90 tablet  3  . sitaGLIPtan-metformin (JANUMET) 50-1000 MG per tablet Take 1 tablet by mouth 2 (two) times daily with a meal.  180 tablet  3  . tiotropium (SPIRIVA HANDIHALER) 18 MCG inhalation capsule Place 1 capsule (18 mcg total) into inhaler and inhale daily.  180 capsule  0  . triamterene-hydrochlorothiazide (MAXZIDE-25) 37.5-25 MG per tablet Take 1 tablet by mouth daily.        Marland Kitchen DISCONTD:  metFORMIN (GLUCOPHAGE) 1000 MG tablet Take 1 tablet (1,000 mg total) by mouth 2 (two) times daily with a meal.  180 tablet  3  . DISCONTD: sitaGLIPtan (JANUVIA) 100 MG tablet Take 1 tablet (100 mg total) by mouth daily.  90 tablet  1    History   Social History  . Marital Status: Married    Spouse Name: N/A    Number of Children: 6  . Years of Education: N/A   Occupational History  . retired     he preaches    Social History Main Topics  . Smoking status: Former Smoker -- 2.0 packs/day    Types: Cigarettes    Quit date: 06/17/1976  . Smokeless tobacco: Never Used  . Alcohol Use: Yes     socially   . Drug Use: No  . Sexually Active: Not on file   Other Topics Concern  . Not on file   Social History Narrative   Has 2 step sons, and 4 childrenRegular exercise- no but active     Family History  Problem Relation Age of Onset  . Heart disease Father   . Diabetes      GM, nephews, many family members  . Hyperlipidemia      ?  Marland Kitchen  Hypertension Father   . Stroke Father   . Colon cancer Neg Hx   . Prostate cancer Neg Hx     Past Medical History  Diagnosis Date  . CAD (coronary artery disease)     Two RCA stents remotely / 3rd RCA stent 2006  . PVD (peripheral vascular disease)     s/p stents at LE 2009, Dr Jacinto Halim  . Colon polyps     s/p several Cscopes.  . Diabetes mellitus     dx aprox 2009  . Hyperlipidemia     dx in 90s  . Hypertension     dx in the 90  . ED (erectile dysfunction)     has a vacumm device  . Hypogonadism male   . Shortness of breath     O2 Sat dropped to 82% walking on the treadmill, September, 2012    Past Surgical History  Procedure Date  . Appendectomy   . Tonsillectomy     ROS   Patient denies fever, chills, headache, sweats, rash, change in vision, change in hearing, chest pain, cough, nausea vomiting, urinary symptoms. All other systems are reviewed and are negative. PHYSICAL EXAM  Filed Vitals:   06/16/12 0948  BP: 175/102    Pulse: 77  Height: 6\' 2"  (1.88 m)  Weight: 264 lb (119.75 kg)  SpO2: 92%     ASSESSMENT & PLAN

## 2012-06-17 DIAGNOSIS — M79609 Pain in unspecified limb: Secondary | ICD-10-CM | POA: Diagnosis not present

## 2012-06-17 DIAGNOSIS — L608 Other nail disorders: Secondary | ICD-10-CM | POA: Diagnosis not present

## 2012-06-17 DIAGNOSIS — E119 Type 2 diabetes mellitus without complications: Secondary | ICD-10-CM | POA: Diagnosis not present

## 2012-06-17 NOTE — Telephone Encounter (Signed)
**Note De-Identified Jonathan [name] Obfuscation** Pt. advised, he states he understands instructions given./LV

## 2012-06-18 ENCOUNTER — Ambulatory Visit (INDEPENDENT_AMBULATORY_CARE_PROVIDER_SITE_OTHER): Payer: Medicare Other | Admitting: Internal Medicine

## 2012-06-18 ENCOUNTER — Encounter: Payer: Self-pay | Admitting: Internal Medicine

## 2012-06-18 VITALS — BP 152/80 | HR 79 | Ht 74.0 in | Wt 262.0 lb

## 2012-06-18 DIAGNOSIS — J45909 Unspecified asthma, uncomplicated: Secondary | ICD-10-CM

## 2012-06-18 DIAGNOSIS — R0602 Shortness of breath: Secondary | ICD-10-CM | POA: Diagnosis not present

## 2012-06-18 DIAGNOSIS — Z23 Encounter for immunization: Secondary | ICD-10-CM | POA: Diagnosis not present

## 2012-06-18 MED ORDER — BUDESONIDE-FORMOTEROL FUMARATE 160-4.5 MCG/ACT IN AERO: 2.0000 | INHALATION_SPRAY | Freq: Two times a day (BID) | RESPIRATORY_TRACT | Status: DC

## 2012-06-18 MED ORDER — TIOTROPIUM BROMIDE MONOHYDRATE 18 MCG IN CAPS: 18.0000 ug | ORAL_CAPSULE | Freq: Every day | RESPIRATORY_TRACT | Status: DC

## 2012-06-18 NOTE — Progress Notes (Signed)
Documentation for 6 MWT 

## 2012-06-18 NOTE — Patient Instructions (Addendum)
Order- ONOX on room air--   Dx asthma, dyspnea  Sample and script for Symbicort 160    2 puffs then rinse mouth, twice daily     Use this instead of the symbicort 80  Sample and restart Spiriva, 1 daily  Flu vax

## 2012-06-18 NOTE — Progress Notes (Signed)
05/01/12- 61 yoM former smoker referred by Dr. Drue Novel for SOB.  2 PPD until 1987 Patient c/o sob and wheezing. Denies  chest pain, and chest tightness. He says he has not been as aware of shortness of breath as Dr. Drue Novel. In [employer] service a nurse commented on his labored breathing. He does not snore and does not make loud breathing noises but apparently is perceived to have  increased respiratory effort and some wheeze. This is perennial and may be worse supine. His breathing is not waking him during the night and he denies cough, choking or strangling. He is able to walk long distances. He tried Spiriva and Symbicort without effect. Denies any history of pneumonia, asthma or an anemia. History of myocardial infarction with coronary and femoral stents. He does not notice claudication. PFT- 07/10/11-  Normal spirometry flows with small- airway response to bronchodilator, normal lung volumes and moderately reduced diffusion. FEV1/FVC 0.79, DLCO 58% CXR 03/19/12-reviewed with him CHEST - 2 VIEW  Comparison: 06/28/2008  Findings: The heart size and mediastinal contours are within normal  limits. Both lungs are clear. The visualized skeletal structures  are unremarkable.  IMPRESSION:  Negative exam.  Original Report Authenticated By: Rosealee Albee, M.D.  REST Stress Myoview -05/25/11 Impression  Exercise Capacity: Good exercise capacity.  BP Response: Normal blood pressure response.  Clinical Symptoms: Mild chest pain/dyspnea.  ECG Impression: No significant ST segment change suggestive of ischemia.  Comparison with Prior Nuclear Study: No images to compare  Overall Impression: Small inferior wall infarct with mild periinfarct ischemia   06/18/12- 76 yoM former smoker followed for dyspnea/ labored breathing, complicated by CAD/ MI Gets short of breath lying in bed-may wake him. Denies snoring. Off Spiriva. -06/18/12- 96%, 88%, 94% 449 L. Significant desaturation with exercise.  ROS-see  HPI Constitutional:   No-   weight loss, night sweats, fevers, chills, fatigue, lassitude. HEENT:   No-  headaches, difficulty swallowing, tooth/dental problems, sore throat,       No-  sneezing, itching, ear ache, nasal congestion, post nasal drip,  CV:  No-   chest pain, orthopnea, PND, swelling in lower extremities, anasarca, dizziness, palpitations Resp:+ "sometimes"shortness of breath with exertion or at rest.              No-   productive cough,  No non-productive cough,  No- coughing up of blood.              No-   change in color of mucus.  No- wheezing.   Skin: No-   rash or lesions. GI:  No-   heartburn, indigestion, abdominal pain, nausea, vomiting, GU:  MS:  No-   joint pain or swelling.   Neuro-     nothing unusual Psych:  No- change in mood or affect. No depression or anxiety.  No memory loss.  OBJ- Physical Exam General- Alert, Oriented, Affect-appropriate, Distress- none acute Skin- rash-none, lesions- none, excoriation- none Lymphadenopathy- none Head- atraumatic            Eyes- Gross vision intact, PERRLA, conjunctivae and secretions clear            Ears- Hearing, canals-normal            Nose- Clear, no-Septal dev, mucus, polyps, erosion, perforation             Throat- Mallampati II , mucosa clear , drainage- none, tonsils- atrophic Neck- flexible , trachea midline, no stridor , thyroid nl, carotid no bruit Chest -  symmetrical excursion , unlabored           Heart/CV- RRR , no murmur , no gallop  , no rub, nl s1 s2                           - JVD- none , edema- none, stasis changes- none, varices- none           Lung- + trace wheeze upper lung zones, cough- none , dullness-none, rub- none           Chest wall-  Abd-  Br/ Gen/ Rectal- Not done, not indicated Extrem- cyanosis- none, clubbing?- Question mild spooning of nails; atrophy- none, strength- nl Neuro- grossly intact to observation

## 2012-06-23 DIAGNOSIS — J45909 Unspecified asthma, uncomplicated: Secondary | ICD-10-CM | POA: Diagnosis not present

## 2012-06-23 DIAGNOSIS — R0602 Shortness of breath: Secondary | ICD-10-CM | POA: Diagnosis not present

## 2012-06-24 ENCOUNTER — Telehealth: Payer: Self-pay | Admitting: Internal Medicine

## 2012-06-24 NOTE — Telephone Encounter (Signed)
I called [facility] and they state the Symbicort needs a Prior Auth. They are sending fax to triage fax with infor. Lawson Fiscal is aware. Carron Curie, CMA

## 2012-06-26 MED ORDER — BUDESONIDE-FORMOTEROL FUMARATE 160-4.5 MCG/ACT IN AERO: 2.0000 | INHALATION_SPRAY | Freq: Two times a day (BID) | RESPIRATORY_TRACT | Status: DC

## 2012-06-26 NOTE — Telephone Encounter (Signed)
Called pharmacy and pharmacist states that they cannot fill the new rx for Symbicort 160 because the [employer] has already sent out the Symbicort 80. Pt has received the Symbicort 80 and cannot be returned. I will give the pt samples to help absorb the cost of the incorrect dosage and send a new rx to the [facility] pharmacy for correct dosage. Pt will come by to pick up samples.

## 2012-06-28 NOTE — Assessment & Plan Note (Addendum)
Probable cardiac component to exertional desaturation, and possibly to dyspnea while supine/orthopnea. There may be enough of a reactive/asthma component to see benefit from broad-spectrum bronchodilators.  Plan-flu vaccine, increased Symbicort to 160 mg, add back Spiriva. Check overnight oximetry

## 2012-07-03 ENCOUNTER — Other Ambulatory Visit: Payer: Self-pay | Admitting: Internal Medicine

## 2012-07-03 DIAGNOSIS — J45909 Unspecified asthma, uncomplicated: Secondary | ICD-10-CM

## 2012-07-14 ENCOUNTER — Encounter: Payer: Self-pay | Admitting: Internal Medicine

## 2012-07-14 ENCOUNTER — Ambulatory Visit (INDEPENDENT_AMBULATORY_CARE_PROVIDER_SITE_OTHER): Payer: Medicare Other | Admitting: Internal Medicine

## 2012-07-14 VITALS — BP 154/88 | HR 73 | Temp 97.5°F | Wt 264.0 lb

## 2012-07-14 DIAGNOSIS — R0602 Shortness of breath: Secondary | ICD-10-CM

## 2012-07-14 DIAGNOSIS — E291 Testicular hypofunction: Secondary | ICD-10-CM

## 2012-07-14 DIAGNOSIS — I10 Essential (primary) hypertension: Secondary | ICD-10-CM | POA: Diagnosis not present

## 2012-07-14 DIAGNOSIS — E109 Type 1 diabetes mellitus without complications: Secondary | ICD-10-CM | POA: Diagnosis not present

## 2012-07-14 MED ORDER — CLOPIDOGREL BISULFATE 75 MG PO TABS: 75.0000 mg | ORAL_TABLET | Freq: Every day | ORAL | Status: DC

## 2012-07-14 MED ORDER — GLIMEPIRIDE 4 MG PO TABS: 4.0000 mg | ORAL_TABLET | Freq: Every day | ORAL | Status: DC

## 2012-07-14 MED ORDER — TESTOSTERONE 20.25 MG/ACT (1.62%) TD GEL: 4.0000 "application " | Freq: Every day | TRANSDERMAL | Status: DC

## 2012-07-14 MED ORDER — LOSARTAN POTASSIUM 100 MG PO TABS: 100.0000 mg | ORAL_TABLET | Freq: Every day | ORAL | Status: DC

## 2012-07-14 NOTE — Assessment & Plan Note (Signed)
Improved

## 2012-07-14 NOTE — Assessment & Plan Note (Addendum)
BP slightly elevated in the last 3 visits, he is in a very good regimen. Plan: Ambulatory BPs, call with readings in one month, consider add another med Office visit in 3 months

## 2012-07-14 NOTE — Assessment & Plan Note (Signed)
Seems to be well-controlled, check a A1c. 

## 2012-07-14 NOTE — Assessment & Plan Note (Addendum)
Good results with Jonathan [name] however he likes to change to Androgel d/t better sexual performance  Plan, will change per patient request, I wonder if his insurance will cover, pt aware. Office visit in 3 months

## 2012-07-14 NOTE — Patient Instructions (Signed)
Check the  blood pressure 2 or 3 times a week, be sure it is between 110/60 and 140/85. If it is consistently higher or lower, let me know  

## 2012-07-14 NOTE — Progress Notes (Signed)
  Subjective:    Patient ID: Jonathan Beath., male    DOB: [birth year], 76 y.o.   MRN: [id number]  HPI Routine visit  dyspnea on exertion, chart reviewed, status post cardiology and pulmonary eval, inhalers were adjusted, he is on home oxygen at night. Overall feels better Hypogonadism, currently on fortesta, reports he likes to go back to androgel, "better sexual performance w/ androgel" Hypertension, good medication compliance, not ambulatory BPs. Ambulatory BP today elevated Diabetes, good compliance with medication, ambulatory CBGs at around 100, no symptoms of blood sugar.  Hypertension, diabes mellitus-- dx aprox 2009   Hyperlipidemia--dx in the 90s   Hypertension-- dx in the 90   Vascular, Dr Myrtis Ser   --CAD s/p stents, 3rd RCA --> 2006   --PVD -- s/p stents at LE 2009   Polyps in colon, s/p several Cscopes , last 2011 w/ Dr Loreta Ave   ED -- has use a vacumm device   Hypogonadism --on testosterone, used to be Rx by Cardiology   Shortness of breath ---O2 Sat dropped to 82% walking on the treadmill, September, 2012   Past Surgical History:   Appendectomy   Tonsillectomy    Social History:   Married , children, 4 and 2 step sons   Retired, he preaches   Former Smoker, quit 1987 (used to smoke 2ppd)   Alcohol use-yes-socially   Drug use-no   Regular exercise-- no but active   Family History:   Heart Disease -- F   Diabetes -- GM, nephews, many family members   High cholesterol-- ?   Hypertension-- F   stroke -- F   colon ca--no   prostate ca-- no    Review of Systems No edema, chest pain or palpitations He is having some stress but no anxiety or depression per se Occasionally his toes feel burning     Objective:   Physical Exam  General -- alert, well-developed, and overweight appearing. No apparent distress.   Lungs -- normal respiratory effort, no intercostal retractions, no accessory muscle use, and normal breath sounds.   Heart-- normal rate, regular rhythm, no  murmur, and no gallop.   Abdomen--soft, non-tender, no distention, no masses, no HSM, no guarding, and no rigidity.   DIABETIC FEET EXAM: No lower extremity edema Normal pedal pulses bilaterally Skin normal, nails thick Pinprick examination of the feet normal. Psych-- Cognition and judgment appear intact. Alert and cooperative with normal attention span and concentration.  not anxious appearing and not depressed appearing.      Assessment & Plan:

## 2012-07-15 ENCOUNTER — Other Ambulatory Visit: Payer: Self-pay | Admitting: Internal Medicine

## 2012-07-15 DIAGNOSIS — R0602 Shortness of breath: Secondary | ICD-10-CM

## 2012-07-22 ENCOUNTER — Telehealth: Payer: Self-pay | Admitting: Internal Medicine

## 2012-07-22 NOTE — Telephone Encounter (Signed)
Message copied by Marshell Garfinkel on Tue Jul 22, 2012 10:41 AM ------      Message from: Jonathan [name]      Created: Mon Jul 21, 2012  9:05 PM      Regarding: labs        Labs ordered 07-14-12 not done, please schedule

## 2012-07-22 NOTE — Telephone Encounter (Signed)
Lmovm to call office.  

## 2012-07-24 ENCOUNTER — Other Ambulatory Visit: Payer: Medicare Other

## 2012-07-24 ENCOUNTER — Telehealth: Payer: Self-pay | Admitting: Internal Medicine

## 2012-07-24 ENCOUNTER — Other Ambulatory Visit: Payer: Self-pay | Admitting: Internal Medicine

## 2012-07-24 LAB — CBC WITH DIFFERENTIAL/PLATELET
Basophils Absolute: 0 10*3/uL (ref 0.0–0.1)
Eosinophils Absolute: 0.2 10*3/uL (ref 0.0–0.7)
HCT: 51.3 % (ref 39.0–52.0)
Hemoglobin: 17 g/dL (ref 13.0–17.0)
Lymphs Abs: 3.2 10*3/uL (ref 0.7–4.0)
MCHC: 33.2 g/dL (ref 30.0–36.0)
Monocytes Absolute: 0.8 10*3/uL (ref 0.1–1.0)
Neutro Abs: 4.1 10*3/uL (ref 1.4–7.7)
Platelets: 195 10*3/uL (ref 150.0–400.0)
RDW: 14.7 % — ABNORMAL HIGH (ref 11.5–14.6)

## 2012-07-24 LAB — HEMOGLOBIN A1C: Hgb A1c MFr Bld: 7.2 % — ABNORMAL HIGH (ref 4.6–6.5)

## 2012-07-24 MED ORDER — GLIMEPIRIDE 4 MG PO TABS: 6.0000 mg | ORAL_TABLET | Freq: Every day | ORAL | Status: DC

## 2012-07-24 NOTE — Telephone Encounter (Signed)
returned [facility] call--Scheduled by [address] [facility] coming in today for labs

## 2012-07-24 NOTE — Telephone Encounter (Signed)
Advise patient, I review the BPs he is sent me for the month of November, most of his readings are 150 or above. Plan: Increase metoprolol XL 100 mg from one tablet daily to 1.5 tablet daily. (I really change the med please) Check the  blood pressure 2 or 3 times a week, be sure it is between 110/60 and 140/85. If it is consistently higher or lower, let me know Also, I just send him a mychart message about diabetes

## 2012-07-25 NOTE — Telephone Encounter (Signed)
Discussed with pt

## 2012-08-15 ENCOUNTER — Encounter: Payer: Self-pay | Admitting: Cardiology

## 2012-08-19 ENCOUNTER — Encounter: Payer: Self-pay | Admitting: Cardiology

## 2012-08-19 ENCOUNTER — Ambulatory Visit (INDEPENDENT_AMBULATORY_CARE_PROVIDER_SITE_OTHER): Payer: Medicare Other | Admitting: Cardiology

## 2012-08-19 VITALS — BP 146/90 | HR 73 | Ht 74.0 in | Wt 261.8 lb

## 2012-08-19 DIAGNOSIS — I2510 Atherosclerotic heart disease of native coronary artery without angina pectoris: Secondary | ICD-10-CM | POA: Diagnosis not present

## 2012-08-19 DIAGNOSIS — I739 Peripheral vascular disease, unspecified: Secondary | ICD-10-CM | POA: Diagnosis not present

## 2012-08-19 DIAGNOSIS — I10 Essential (primary) hypertension: Secondary | ICD-10-CM

## 2012-08-19 NOTE — Patient Instructions (Addendum)
Your physician wants you to follow-up in: 6 months  You will receive a reminder letter in the mail two months in advance. If you don't receive a letter, please call our office to schedule the follow-up appointment.  Your physician recommends that you continue on your current medications as directed. Please refer to the Current Medication list given to you today.  

## 2012-08-19 NOTE — Assessment & Plan Note (Signed)
The patient has peripheral arterial disease. He received a stent to his SFA on the left side in 2009. By history I think he has had some Doppler followup at other sites. However I have been unable to obtain this data. In addition I do not have any record that he has had a carotid Doppler over a period of time. At this point he does not need any studies. Over time I will decide when to do followup peripheral arterial Dopplers and went to do carotid Dopplers.

## 2012-08-19 NOTE — Assessment & Plan Note (Signed)
The patient's systolic pressure is under reasonable control. His diastolic pressure runs close to 90. He is on multiple medications. I've chosen not to change his medicines further. I've encouraged him to increase his exercise and begin to try to lose some weight.

## 2012-08-19 NOTE — Assessment & Plan Note (Signed)
The patient's coronary disease has been stable. He's had stents in the past. His nuclear in September, 2012 revealed a small inferior scar and mild peri-infarct ischemia with an ejection fraction of 55%. He does not need further workup at this time.

## 2012-08-19 NOTE — Progress Notes (Signed)
Patient ID: Jonathan Beath., male   DOB: [birth year], 76 y.o.   MRN: [id number]   HPI  The patient is seen for followup hypertension and coronary disease and peripheral arterial disease. I saw the patient last June 16, 2012. We started him on amlodipine for his blood pressure. He has had a reasonable response. He has also been following with his primary physician. He returns today for followup. He is not having any significant chest pain or shortness of breath. He does have lung disease. He wears his CPAP at night.  No Known Allergies  Current Outpatient Prescriptions  Medication Sig Dispense Refill  . amLODipine (NORVASC) 10 MG tablet Take 1 tablet (10 mg total) by mouth daily.  90 tablet  1  . aspirin 81 MG tablet Take 81 mg by mouth daily.        . budesonide-formoterol (SYMBICORT) 160-4.5 MCG/ACT inhaler Inhale 2 puffs into the lungs 2 (two) times daily.  4 Inhaler  0  . clopidogrel (PLAVIX) 75 MG tablet Take 1 tablet (75 mg total) by mouth daily.  90 tablet  1  . glimepiride (AMARYL) 4 MG tablet Take 1.5 tablets (6 mg total) by mouth daily before breakfast.  145 tablet  1  . losartan (COZAAR) 100 MG tablet Take 1 tablet (100 mg total) by mouth daily.  90 tablet  1  . metoprolol succinate (TOPROL-XL) 100 MG 24 hr tablet Take 150 mg by mouth daily.      . simvastatin (ZOCOR) 20 MG tablet Take 1 tablet (20 mg total) by mouth at bedtime.  90 tablet  3  . sitaGLIPtan-metformin (JANUMET) 50-1000 MG per tablet Take 1 tablet by mouth 2 (two) times daily with a meal.  180 tablet  3  . Testosterone 20.25 MG/ACT (1.62%) GEL Place 4 application onto the skin daily.  225 g  1  . tiotropium (SPIRIVA HANDIHALER) 18 MCG inhalation capsule Place 1 capsule (18 mcg total) into inhaler and inhale daily.  10 capsule  0  . triamterene-hydrochlorothiazide (MAXZIDE-25) 37.5-25 MG per tablet Take 1 tablet by mouth daily.        . [DISCONTINUED] metFORMIN (GLUCOPHAGE) 1000 MG tablet Take 1 tablet (1,000 mg  total) by mouth 2 (two) times daily with a meal.  180 tablet  3  . [DISCONTINUED] sitaGLIPtan (JANUVIA) 100 MG tablet Take 1 tablet (100 mg total) by mouth daily.  90 tablet  1    History   Social History  . Marital Status: Married    Spouse Name: N/A    Number of Children: 6  . Years of Education: N/A   Occupational History  . retired     he preaches    Social History Main Topics  . Smoking status: Former Smoker -- 2.0 packs/day    Types: Cigarettes    Quit date: 06/17/1976  . Smokeless tobacco: Never Used  . Alcohol Use: Yes     Comment: socially   . Drug Use: No  . Sexually Active: Not on file   Other Topics Concern  . Not on file   Social History Narrative   Has 2 step sons, and 4 childrenRegular exercise- no but active     Family History  Problem Relation Age of Onset  . Heart disease Father   . Diabetes      GM, nephews, many family members  . Hyperlipidemia      ?  Marland Kitchen Hypertension Father   . Stroke Father   . Colon cancer  Neg Hx   . Prostate cancer Neg Hx     Past Medical History  Diagnosis Date  . CAD (coronary artery disease)     Two RCA stents remotely / 3rd RCA stent 2006  . PVD (peripheral vascular disease)     s/p stents at LE 2009, Dr Jacinto Halim  . Colon polyps     s/p several Cscopes.  . Diabetes mellitus     dx aprox 2009  . Hyperlipidemia     dx in 90s  . Hypertension     dx in the 90  . ED (erectile dysfunction)     has a vacumm device  . Hypogonadism male   . Shortness of breath     O2 Sat dropped to 82% walking on the treadmill, September, 2012    Past Surgical History  Procedure Date  . Appendectomy   . Tonsillectomy     Patient Active Problem List  Diagnosis  . DIABETES MELLITUS, TYPE I  . HYPOGONADISM  . HYPERLIPIDEMIA  . HYPERTENSION  . UNSPECIFIED HEMORRHAGE  . HEADACHE  . CAD (coronary artery disease)  . PVD (peripheral vascular disease)  . Shortness of breath  . Back pain  . General medical examination  .  Vitamin d deficiency    ROS   Patient denies fever, chills, headache, sweats, rash, change in vision, change in hearing, chest pain, cough, nausea vomiting, urinary symptoms. All other systems are reviewed and are negative.  PHYSICAL EXAM  Patient is oriented to person time and place. Affect is normal. There is no jugular venous distention. Lungs are clear. Respiratory effort is not labored. Cardiac exam reveals S1-S2. There no clicks or significant murmurs. Abdomen is soft. There is no significant peripheral edema.  Filed Vitals:   08/19/12 1357  BP: 146/90  Pulse: 73  Height: 6\' 2"  (1.88 m)  Weight: 261 lb 12 oz (118.729 kg)   EKG is done today and reviewed by me. There is normal sinus rhythm with first-degree AV block. There is no significant change.  ASSESSMENT & PLAN

## 2012-09-03 ENCOUNTER — Telehealth: Payer: Self-pay | Admitting: Internal Medicine

## 2012-09-03 DIAGNOSIS — I2510 Atherosclerotic heart disease of native coronary artery without angina pectoris: Secondary | ICD-10-CM

## 2012-09-03 DIAGNOSIS — R0602 Shortness of breath: Secondary | ICD-10-CM

## 2012-09-03 NOTE — Telephone Encounter (Signed)
CY-are you okay with me placing order to see what portable tank patient can get through his DME company. Thanks.

## 2012-09-03 NOTE — Telephone Encounter (Signed)
For air travel he would need to have a portable concentrator. The airline won't let him carry compressed O2 onto a plane. Ok to order [employer] for O2 2L/M from his DME so he can lease it for travel, if they can't let him have one of his own. I don't find results of 6 MWT to qualify for portable O2.

## 2012-09-03 NOTE — Telephone Encounter (Signed)
Pt aware that I have placed order to [employer] for portable O2 at 2l/m with exertion and QHS. Pt understands he will need to discuss cost and insurance coverage with [employer].

## 2012-09-18 ENCOUNTER — Encounter: Payer: Self-pay | Admitting: Internal Medicine

## 2012-09-18 ENCOUNTER — Ambulatory Visit (INDEPENDENT_AMBULATORY_CARE_PROVIDER_SITE_OTHER): Payer: Medicare Other | Admitting: Internal Medicine

## 2012-09-18 VITALS — BP 160/90 | HR 70 | Ht 74.0 in | Wt 262.8 lb

## 2012-09-18 DIAGNOSIS — J449 Chronic obstructive pulmonary disease, unspecified: Secondary | ICD-10-CM | POA: Diagnosis not present

## 2012-09-18 NOTE — Patient Instructions (Addendum)
Order- script DME [facility]  Portable O2 concentrator 2L/M for travel   Dx COPD

## 2012-09-18 NOTE — Progress Notes (Signed)
05/01/12- 12 yoM former smoker referred by Dr. Drue Novel for SOB.  2 PPD until 1987 Patient c/o sob and wheezing. Denies  chest pain, and chest tightness. He says he has not been as aware of shortness of breath as Dr. Drue Novel. In [employer] service a nurse commented on his labored breathing. He does not snore and does not make loud breathing noises but apparently is perceived to have  increased respiratory effort and some wheeze. This is perennial and may be worse supine. His breathing is not waking him during the night and he denies cough, choking or strangling. He is able to walk long distances. He tried Spiriva and Symbicort without effect. Denies any history of pneumonia, asthma or an anemia. History of myocardial infarction with coronary and femoral stents. He does not notice claudication. PFT- 07/10/11-  Normal spirometry flows with small- airway response to bronchodilator, normal lung volumes and moderately reduced diffusion. FEV1/FVC 0.79, DLCO 58% CXR 03/19/12-reviewed with him CHEST - 2 VIEW  Comparison: 06/28/2008  Findings: The heart size and mediastinal contours are within normal  limits. Both lungs are clear. The visualized skeletal structures  are unremarkable.  IMPRESSION:  Negative exam.  Original Report Authenticated By: Rosealee Albee, M.D.  REST Stress Myoview -05/25/11 Impression  Exercise Capacity: Good exercise capacity.  BP Response: Normal blood pressure response.  Clinical Symptoms: Mild chest pain/dyspnea.  ECG Impression: No significant ST segment change suggestive of ischemia.  Comparison with Prior Nuclear Study: No images to compare  Overall Impression: Small inferior wall infarct with mild periinfarct ischemia   06/18/12- 76 yoM former smoker followed for dyspnea/ labored breathing, complicated by CAD/ MI Gets short of breath lying in bed-may wake him. Denies snoring. Off Spiriva. -06/18/12- 96%, 88%, 94% 449 L. Significant desaturation with exercise.  09/18/12- 80 yoM  former smoker followed for dyspnea/ labored breathing, complicated by CAD/ MI Follows For : pt states having no issues today. states that wife tells him he breaths thru his mouth  at night. pt is on  2L at night/ APS.  Overnight oximetry on room air 06/23/2012 had demonstrated over 5 hours with saturation less than 80% while asleep. We had called to start home oxygen during sleep. He now wants to travel to [place] and then to [place]. I discussed his oxygen status the reason for prescribing oxygen. I am suggesting he get a portable concentrator for this trip.  ROS-see HPI Constitutional:   No-   weight loss, night sweats, fevers, chills, fatigue, lassitude. HEENT:   No-  headaches, difficulty swallowing, tooth/dental problems, sore throat,       No-  sneezing, itching, ear ache, nasal congestion, post nasal drip,  CV:  No-   chest pain, orthopnea, PND, swelling in lower extremities, anasarca, dizziness, palpitations Resp:   + "sometimes"shortness of breath with exertion or at rest.              No-   productive cough,  No non-productive cough,  No- coughing up of blood.              No-   change in color of mucus.  No- wheezing.   Skin: No-   rash or lesions. GI:  No-   heartburn, indigestion, abdominal pain, nausea, vomiting, GU:  MS:  No-   joint pain or swelling.   Neuro-     nothing unusual Psych:  No- change in mood or affect. No depression or anxiety.  No memory loss.  OBJ- Physical Exam  BP 160/90  Pulse 70  Ht 6\' 2"  (1.88 m)  Wt 262 lb 12.8 oz (119.205 kg)  BMI 33.74 kg/m2  SpO2 95% at rest on room air General- Alert, Oriented, Affect-appropriate, Distress- none acute. Overweight Skin- rash-none, lesions- none, excoriation- none Lymphadenopathy- none Head- atraumatic            Eyes- Gross vision intact, PERRLA, conjunctivae and secretions clear            Ears- Hearing, canals-normal            Nose- Clear, no-Septal dev, mucus, polyps, erosion, perforation              Throat- Mallampati II , mucosa clear , drainage- none, tonsils- atrophic Neck- flexible , trachea midline, no stridor , thyroid nl, carotid no bruit Chest - symmetrical excursion , unlabored           Heart/CV- RRR , no murmur , no gallop  , no rub, nl s1 s2                           - JVD+ , edema- none, stasis changes- none, varices- none           Lung- clear, cough- none , dullness-none, rub- none           Chest wall-  Abd-  Br/ Gen/ Rectal- Not done, not indicated Extrem- cyanosis- none, clubbing?- Question mild spooning of nails; atrophy- none, strength- nl Neuro- grossly intact to observation

## 2012-09-22 ENCOUNTER — Telehealth: Payer: Self-pay | Admitting: *Deleted

## 2012-09-22 DIAGNOSIS — M999 Biomechanical lesion, unspecified: Secondary | ICD-10-CM | POA: Diagnosis not present

## 2012-09-22 DIAGNOSIS — M5430 Sciatica, unspecified side: Secondary | ICD-10-CM | POA: Diagnosis not present

## 2012-09-22 DIAGNOSIS — M461 Sacroiliitis, not elsewhere classified: Secondary | ICD-10-CM | POA: Diagnosis not present

## 2012-09-22 MED ORDER — CLOPIDOGREL BISULFATE 75 MG PO TABS: 75.0000 mg | ORAL_TABLET | Freq: Every day | ORAL | Status: DC

## 2012-09-22 NOTE — Telephone Encounter (Signed)
Refill done.  

## 2012-09-26 ENCOUNTER — Other Ambulatory Visit: Payer: Self-pay | Admitting: *Deleted

## 2012-09-26 MED ORDER — TESTOSTERONE 20.25 MG/ACT (1.62%) TD GEL: 4.0000 "application " | Freq: Every day | TRANSDERMAL | Status: DC

## 2012-09-26 MED ORDER — GLIMEPIRIDE 4 MG PO TABS: 6.0000 mg | ORAL_TABLET | Freq: Every day | ORAL | Status: DC

## 2012-09-26 NOTE — Telephone Encounter (Signed)
Refill done.  

## 2012-09-26 NOTE — Telephone Encounter (Signed)
Ok 225 mg and 1 RF

## 2012-09-26 NOTE — Telephone Encounter (Signed)
Pt called requesting a refill for androgel 20.25mg . OK to refill? Last OV 10.28.13 Last filled 10.28.13

## 2012-09-28 NOTE — Assessment & Plan Note (Signed)
COPD with significant impairment of oxygen exchange identified mainly during sleep and with exertion. He does not have much bronchitis component. Plan-educated about oxygen therapy and with particular discussion of portable oxygen concentrator for travel. He will talk to his DME company

## 2012-10-04 ENCOUNTER — Other Ambulatory Visit: Payer: Self-pay | Admitting: Internal Medicine

## 2012-10-08 ENCOUNTER — Other Ambulatory Visit: Payer: Self-pay | Admitting: *Deleted

## 2012-10-10 NOTE — Telephone Encounter (Signed)
Refill done.  

## 2012-10-14 ENCOUNTER — Ambulatory Visit (INDEPENDENT_AMBULATORY_CARE_PROVIDER_SITE_OTHER): Payer: Medicare Other | Admitting: Internal Medicine

## 2012-10-14 VITALS — BP 148/88 | HR 68 | Temp 97.7°F | Wt 264.0 lb

## 2012-10-14 DIAGNOSIS — G252 Other specified forms of tremor: Secondary | ICD-10-CM | POA: Diagnosis not present

## 2012-10-14 DIAGNOSIS — E785 Hyperlipidemia, unspecified: Secondary | ICD-10-CM | POA: Diagnosis not present

## 2012-10-14 DIAGNOSIS — E291 Testicular hypofunction: Secondary | ICD-10-CM | POA: Diagnosis not present

## 2012-10-14 DIAGNOSIS — I10 Essential (primary) hypertension: Secondary | ICD-10-CM

## 2012-10-14 DIAGNOSIS — G250 Essential tremor: Secondary | ICD-10-CM

## 2012-10-14 DIAGNOSIS — E109 Type 1 diabetes mellitus without complications: Secondary | ICD-10-CM

## 2012-10-14 LAB — BASIC METABOLIC PANEL
BUN: 13 mg/dL (ref 6–23)
Chloride: 105 mEq/L (ref 96–112)
Glucose, Bld: 127 mg/dL — ABNORMAL HIGH (ref 70–99)
Potassium: 4.6 mEq/L (ref 3.5–5.1)

## 2012-10-14 LAB — LIPID PANEL
HDL: 39 mg/dL — ABNORMAL LOW (ref >39.00)
Total CHOL/HDL Ratio: 3
VLDL: 19 mg/dL (ref 0.0–40.0)

## 2012-10-14 NOTE — Assessment & Plan Note (Signed)
On AndroGel 4 pounds for several weeks. Check a testosterone

## 2012-10-14 NOTE — Assessment & Plan Note (Addendum)
Reports occasional tremor of the left hand when eating, most likely  Essential tremor. We'll monitor for now.

## 2012-10-14 NOTE — Assessment & Plan Note (Signed)
Will check labs. Good compliance with medication.

## 2012-10-14 NOTE — Assessment & Plan Note (Signed)
Ambulatory BPs in the 140s/80s. He is in a good medication regimen. No change for now.

## 2012-10-14 NOTE — Assessment & Plan Note (Addendum)
Based on the last A1c, will increase Amaryl dose, sugar currently  always less than 120. Will check A1c. normal feet exam, feet care discussed

## 2012-10-14 NOTE — Progress Notes (Signed)
  Subjective:    Patient ID: Jonathan Beath., male    DOB: 05/28/1936, 77 y.o.   MRN: [id number]  HPI Routine office visit COPD, pulmonary note reviewed, patient using nocturnal oxygen and sometimes will during the daytime. Has been unable to get portable oxygen. CAD, recent note from cardiology reviewed. He is a stable. Diabetes, good compliance with medication, ambulatory blood sugars are always less than 120, no symptoms of low blood sugar. Hypogonadism, good compliance with AndroGel. Hypertension, ambulatory BPs in the 140s/80s. From time to time has noted tremor of the left hand when he eats.  Hypertension,  diabes mellitus-- dx aprox 2009  Hyperlipidemia--dx in the 90s  Hypertension-- dx in the 90  Vascular, Dr Myrtis Ser  --CAD s/p stents, 3rd RCA --> 2006  --PVD -- s/p stents at LE 2009  Polyps in colon, s/p several Cscopes , last 2011 w/ Dr Loreta Ave  ED -- has use a vacumm device  Hypogonadism --on testosterone, used to be Rx by Cardiology  Shortness of breath ---O2 Sat dropped to 82% walking on the treadmill, September, 2012   Past Surgical History:  Appendectomy  Tonsillectomy   Social History:  Married , children, 4 and 2 step sons  Retired, he preaches  Former Smoker, quit 1987 (used to smoke 2ppd)  Alcohol use-yes-socially  Drug use-no  Regular exercise-- no but active   Family History:  Heart Disease -- F  Diabetes -- GM, nephews, many family members  High cholesterol-- ?  Hypertension-- F  stroke -- F  colon ca--no  prostate ca-- no     Review of Systems Denies chest pain or palpitations No headache, slurred speech, motor deficits or facial numbness. His gait is not as steady as years ago.    Objective:   Physical Exam  General -- alert, well-developed DIABETIC FEET EXAM: No lower extremity edema Normal pedal pulses bilaterally Skin normal, right great toe nail dystrophic. No calluses Pinprick examination of the feet normal. Neurologic-- alert &  oriented X3 , speech fluent and normal, gait normal, arms are normal. Motor symmetric. EOMI. No tremor noted today Psych-- Cognition and judgment appear intact. Alert and cooperative with normal attention span and concentration.  not anxious appearing and not depressed appearing.      Assessment & Plan:

## 2012-10-15 ENCOUNTER — Encounter: Payer: Self-pay | Admitting: Internal Medicine

## 2012-10-15 LAB — TESTOSTERONE, FREE, TOTAL, SHBG
Sex Hormone Binding: 34 nmol/L (ref 13–71)
Testosterone, Free: 170.4 pg/mL (ref 47.0–244.0)
Testosterone-% Free: 2.2 % (ref 1.6–2.9)

## 2012-10-23 ENCOUNTER — Ambulatory Visit (INDEPENDENT_AMBULATORY_CARE_PROVIDER_SITE_OTHER): Payer: Medicare Other | Admitting: Internal Medicine

## 2012-10-23 ENCOUNTER — Encounter: Payer: Self-pay | Admitting: Internal Medicine

## 2012-10-23 VITALS — BP 160/98 | HR 69 | Temp 97.9°F | Wt 261.0 lb

## 2012-10-23 DIAGNOSIS — I10 Essential (primary) hypertension: Secondary | ICD-10-CM

## 2012-10-23 DIAGNOSIS — H2510 Age-related nuclear cataract, unspecified eye: Secondary | ICD-10-CM | POA: Diagnosis not present

## 2012-10-23 DIAGNOSIS — E119 Type 2 diabetes mellitus without complications: Secondary | ICD-10-CM | POA: Diagnosis not present

## 2012-10-23 LAB — HM DIABETES EYE EXAM

## 2012-10-23 MED ORDER — TRIAMTERENE-HCTZ 37.5-25 MG PO TABS: 1.0000 | ORAL_TABLET | Freq: Every day | ORAL | Status: DC

## 2012-10-23 NOTE — Progress Notes (Signed)
  Subjective:    Patient ID: Jonathan Beath., male    DOB: 19-Sep-1935, 77 y.o.   MRN: [id number]  HPI  Blood pressure was found to be 180/110 at Dr. Laruth Bouchard office today 10/23/12. He is ophthalmologic exam revealed very narrow arterioles suggestive of uncontrolled blood pressure.  He had previously been on triamterene-hydrochlorothiazide 37.5-25 mg; inadvertently this was stopped several months ago. He is unaware why this occurred.   Review of Systems Blood pressure is monitored at home; the blood pressure range is 150-160/100.  Epistaxis, headache, lightheadedness,chest pain, palpitations, edema, and claudication are not present.  Dyspnea present with exertion; also has some paroxysmal nocturnal dyspnea. He is on Spiriva and Symbicort. He also is on nocturnal oxygen  Medication compliance is good except for the intervertebral discontinuation of his diuretic as noted above Adverse medication effects are not present  Sodium restriction is not  employed  Exercise is walking @ mall for 60-90 minutes           Objective:   Physical Exam Appears healthy and well-nourished & in no acute distress.Appears younger than stated age   No carotid bruits are present.No neck pain distention present at 10 - 15 degrees. Thyroid normal to palpation  Heart rhythm and rate are normal with no significant gallops. Grade 1/6 systolic murmur   Chest is clear with no increased work of breathing  There is no evidence of aortic aneurysm or renal artery bruits  Abdomen soft with no organomegaly or masses. No HJR  Slight clubbing. No cyanosis or edema present.  Pedal pulses are intact   No ischemic skin changes are present . Nails healthy  Alert and oriented.           Assessment & Plan:

## 2012-10-23 NOTE — Assessment & Plan Note (Signed)
Blood pressure goals discussed. Avoidance of excess sodium would be recommended. The generic Maxzide will be restarted with home blood pressure monitor.

## 2012-10-23 NOTE — Patient Instructions (Addendum)
Minimal Blood Pressure Goal= AVERAGE < 140/90;  Ideal is an AVERAGE < 135/85. This AVERAGE should be calculated from @ least 5-7 BP readings taken @ different times of day on different days of week. You should not respond to isolated BP readings , but rather the AVERAGE for that week .Please bring your  blood pressure cuff to office visits to verify that it is reliable.It  can also be checked against the blood pressure device at the pharmacy. Finger or wrist cuffs are not dependable; an arm cuff is.   Avoid ingestion of  excess salt/sodium.Cook with pepper & other spices . Use the salt substitute "No Salt"(unless your potassium becomes  elevated) OR the Mrs Sharilyn Sites products to season food @ the table. Avoid foods which taste salty or "vinegary" as their sodium contentet will be high.

## 2012-11-03 ENCOUNTER — Other Ambulatory Visit: Payer: Self-pay | Admitting: *Deleted

## 2012-11-03 DIAGNOSIS — I10 Essential (primary) hypertension: Secondary | ICD-10-CM

## 2012-11-03 MED ORDER — TRIAMTERENE-HCTZ 37.5-25 MG PO TABS: 1.0000 | ORAL_TABLET | Freq: Every day | ORAL | Status: DC

## 2012-11-03 NOTE — Telephone Encounter (Signed)
Refill for maxide sent to [facility]

## 2012-11-04 ENCOUNTER — Telehealth: Payer: Self-pay | Admitting: *Deleted

## 2012-11-04 NOTE — Telephone Encounter (Signed)
Message copied by Nada Maclachlan on Tue Nov 04, 2012  9:33 AM ------      Message from: Jonathan [name]      Created: Mon Oct 27, 2012  5:03 PM       Open a phone call note, check on the patient, saw Dr. Alwyn Ren with increased BP: Maxzide was restarted. How are BPs doing?.      Needs office visit in one month ------

## 2012-11-04 NOTE — Telephone Encounter (Signed)
lmovm for pt to return call.  

## 2012-11-07 ENCOUNTER — Other Ambulatory Visit: Payer: Self-pay

## 2012-11-07 MED ORDER — AMLODIPINE BESYLATE 10 MG PO TABS: 10.0000 mg | ORAL_TABLET | Freq: Every day | ORAL | Status: DC

## 2012-11-11 ENCOUNTER — Encounter: Payer: Self-pay | Admitting: *Deleted

## 2012-11-18 ENCOUNTER — Telehealth: Payer: Self-pay | Admitting: Internal Medicine

## 2012-11-18 NOTE — Telephone Encounter (Signed)
thx

## 2012-11-18 NOTE — Telephone Encounter (Signed)
FYI

## 2012-11-18 NOTE — Telephone Encounter (Signed)
Pt came in and stated that he doesn't need DMV paperwork signed - he now has 100 % disability from [employer] and they did the form for him.

## 2012-11-26 ENCOUNTER — Other Ambulatory Visit: Payer: Self-pay | Admitting: *Deleted

## 2012-11-26 DIAGNOSIS — I10 Essential (primary) hypertension: Secondary | ICD-10-CM

## 2012-11-26 DIAGNOSIS — I25810 Atherosclerosis of coronary artery bypass graft(s) without angina pectoris: Secondary | ICD-10-CM

## 2012-11-26 MED ORDER — TRIAMTERENE-HCTZ 37.5-25 MG PO TABS: 1.0000 | ORAL_TABLET | Freq: Every day | ORAL | Status: DC

## 2012-11-26 MED ORDER — CLOPIDOGREL BISULFATE 75 MG PO TABS: 75.0000 mg | ORAL_TABLET | Freq: Every day | ORAL | Status: DC

## 2012-11-26 NOTE — Telephone Encounter (Signed)
Refills for maxide and plavix sent to [facility]

## 2012-12-02 ENCOUNTER — Other Ambulatory Visit: Payer: Self-pay | Admitting: Internal Medicine

## 2012-12-02 NOTE — Telephone Encounter (Signed)
Refill done.  

## 2012-12-13 ENCOUNTER — Other Ambulatory Visit: Payer: Self-pay | Admitting: Internal Medicine

## 2012-12-15 NOTE — Telephone Encounter (Signed)
Refill done.  

## 2013-01-01 DIAGNOSIS — M999 Biomechanical lesion, unspecified: Secondary | ICD-10-CM | POA: Diagnosis not present

## 2013-01-01 DIAGNOSIS — M461 Sacroiliitis, not elsewhere classified: Secondary | ICD-10-CM | POA: Diagnosis not present

## 2013-01-01 DIAGNOSIS — M5430 Sciatica, unspecified side: Secondary | ICD-10-CM | POA: Diagnosis not present

## 2013-01-06 ENCOUNTER — Encounter: Payer: Self-pay | Admitting: Internal Medicine

## 2013-01-06 ENCOUNTER — Ambulatory Visit (INDEPENDENT_AMBULATORY_CARE_PROVIDER_SITE_OTHER): Payer: Medicare Other | Admitting: Internal Medicine

## 2013-01-06 VITALS — BP 140/90 | HR 71 | Ht 74.5 in | Wt 265.6 lb

## 2013-01-06 DIAGNOSIS — E109 Type 1 diabetes mellitus without complications: Secondary | ICD-10-CM

## 2013-01-06 DIAGNOSIS — J449 Chronic obstructive pulmonary disease, unspecified: Secondary | ICD-10-CM | POA: Diagnosis not present

## 2013-01-06 DIAGNOSIS — Z0000 Encounter for general adult medical examination without abnormal findings: Secondary | ICD-10-CM

## 2013-01-06 DIAGNOSIS — I10 Essential (primary) hypertension: Secondary | ICD-10-CM | POA: Diagnosis not present

## 2013-01-06 DIAGNOSIS — E291 Testicular hypofunction: Secondary | ICD-10-CM

## 2013-01-06 DIAGNOSIS — E785 Hyperlipidemia, unspecified: Secondary | ICD-10-CM

## 2013-01-06 LAB — COMPREHENSIVE METABOLIC PANEL
ALT: 27 U/L (ref 0–53)
AST: 21 U/L (ref 0–37)
Alkaline Phosphatase: 52 U/L (ref 39–117)
BUN: 18 mg/dL (ref 6–23)
Calcium: 8.8 mg/dL (ref 8.4–10.5)
Creatinine, Ser: 1.4 mg/dL (ref 0.4–1.5)
Total Bilirubin: 0.4 mg/dL (ref 0.3–1.2)

## 2013-01-06 LAB — HEMOGLOBIN A1C: Hgb A1c MFr Bld: 7.6 % — ABNORMAL HIGH (ref 4.6–6.5)

## 2013-01-06 MED ORDER — TESTOSTERONE 20.25 MG/ACT (1.62%) TD GEL: 4.0000 "application " | Freq: Every day | TRANSDERMAL | Status: DC

## 2013-01-06 NOTE — Assessment & Plan Note (Signed)
Good compliance with oxygen at night, able to do his ADLs without oxygen. Uses inhalers "as needed only". Recommend to use at least a Spiriva daily.

## 2013-01-06 NOTE — Assessment & Plan Note (Addendum)
Td 06 Pneumonia shot --completed zostavax discussed last year Colonoscopy-- h/o polyps in colon, s/p several Cscopes per pt  , last cscope 2011 w/ Dr Loreta Ave  (per patient, reportedly normal, no documentation)  DRE (-), PSA next year discussed diet and exercise

## 2013-01-06 NOTE — Assessment & Plan Note (Addendum)
Currently taking janumet as prescribed and Amaryl one tablet a day only. Very rarely has low sugar symptoms, see HPI.  Recommend to carry glucose w/ him. He likes to drink  bottled juices, recommend avoidance due to high sugar content. Check a hemoglobin A1c

## 2013-01-06 NOTE — Progress Notes (Signed)
Subjective:    Patient ID: Jonathan [name]., male    DOB: [birth year], 77 y.o.   MRN: [id number]  HPI Here for Medicare AWV:  1. Risk factors based on Past M, S, F history:reviewed  2. Physical Activities: active, does walk in the treadmil or the mall x 2/week 3. Depression/mood: (-) screening 4. Hearing: no problems noted today, denies issues himself  5. ADL's: totally independent  6. Fall Risk: see instructionsno h/o falls  7. Home Safety: does feelsafe at home  8. Height, weight, &visual acuity: see VS, vision corrected w/ glasses, sees eye doctor regularly, last visit ~ 2 months ago (per [facility] report visit was  10-2012) 9. Counseling: yes, see below  10. Labs ordered based on risk factors: yes  11. Referral Coordination, if needed  12. Care Plan, see assessment and plan  13. Cognitive Assessment, motor skills, memory and cognition seem appropriate   additionally, we discussed the following issues Hypertension, good medication compliance, ambulatory BPs around 136/78. BP today slightly elevated. Diabetes, was supposed to take glimeperide 1.5 tablets a day, taking only one tablet a day. CBGs are checked infrequently, yesterday was 117. Very seldom, maybe once a month, he feels weak- nauseated, no chest pain. Has never checked his CBGs at the time but he feels is a episode are due to low sugar, he eats something and feels better. Hypogonadism, good compliance with testosterone. COPD-- sleeps with oxygen every night, the stop use oxygen for ambulation. Uses Symbicort and  Spiriva prn only CAD, peripheral as her disease, so cardiology 12/12-13, felt to be stable.    Past Medical History  Diagnosis Date  . CAD (coronary artery disease)     Two RCA stents remotely / 3rd RCA stent 2006  . PVD (peripheral vascular disease)     s/p stents at LE 2009, Dr Jacinto Halim  . Colon polyps     s/p several Cscopes.  . Diabetes mellitus     dx aprox 2009  . Hyperlipidemia     dx in 90s  .  Hypertension     dx in the 90  . ED (erectile dysfunction)     has a vacumm device  . Hypogonadism male   . Shortness of breath     O2 Sat dropped to 82% walking on the treadmill, September, 2012  . COPD (chronic obstructive pulmonary disease)     on O2 Rx   Past Surgical History  Procedure Laterality Date  . Appendectomy    . Tonsillectomy     History   Social History  . Marital Status: Married    Spouse Name: N/A    Number of Children: 6  . Years of Education: N/A   Occupational History  . retired     he preaches    Social History Main Topics  . Smoking status: Former Smoker -- 2.00 packs/day for 30 years    Types: Cigarettes    Quit date: 06/17/1986  . Smokeless tobacco: Never Used     Comment: 2 ppd, quit 1987  . Alcohol Use: Yes     Comment: socially   . Drug Use: No  . Sexually Active: Not on file   Other Topics Concern  . Not on file   Social History Narrative   Has 2 step sons, and 4 children   Diet-- reports eats healthy see a/p            Family History  Problem Relation Age of Onset  . Heart  disease Father   . Diabetes      GM, nephews, many family members  . Hyperlipidemia      ?  Marland Kitchen Hypertension Father   . Stroke Father   . Colon cancer Neg Hx   . Prostate cancer Neg Hx      Review of Systems Denies chest pain or lower extremity edema. Able to do all his activities of daily living without oxygen.Occasional cough, no sputum production. No nausea, vomiting, diarrhea or blood in the stools. No dysuria or gross hematuria.     Objective:   Physical Exam BP 140/90  Pulse 71  Ht 6' 2.5" (1.892 m)  Wt 265 lb 9.6 oz (120.475 kg)  BMI 33.66 kg/m2  SpO2 96%  General -- alert, well-developed, No apparent distress. Neck --no thyromegaly , normal carotid pulse Lungs -- normal respiratory effort, no intercostal retractions, no accessory muscle use.Decreased breath sounds.   Heart-- normal rate, regular rhythm, no murmur, and no gallop.    Abdomen--soft, non-tender, no distention, no masses, no HSM, no guarding, and no rigidity.   Extremities-- no pretibial edema bilaterally Rectal-- No external abnormalities noted. Normal sphincter tone. No rectal masses or tenderness. Brown stools. Prostate:  Very hard to reach Prostate,What I was able to reach is normal Neurologic-- alert & oriented X3 and strength normal in all extremities. Psych-- Cognition and judgment appear intact. Alert and cooperative with normal attention span and concentration.  not anxious appearing and not depressed appearing.      Assessment & Plan:

## 2013-01-06 NOTE — Assessment & Plan Note (Signed)
Well-controlled per last cholesterol panel, no change 

## 2013-01-06 NOTE — Assessment & Plan Note (Addendum)
Reports good medication compliance, ambulatory BPs seem normal But BP today elevated. Last creatinine slightly elevated. Plan: Labs Will asked patient to come to the office and let my nurse check his BP ---> see instructions

## 2013-01-06 NOTE — Patient Instructions (Signed)
Watch for low sugar symptoms, carry glucose with you in case you have low sugars. Avoid commercial apple juice, orange juice. Check the  blood pressure 2 or 3 times a week, be sure it is between 110/60 and 140/85. If it is consistently higher or lower, let me know Please schedule a nurse visit one month from today for a BP check. Nocharge. Use spiriva daily !    Fall Prevention and Home Safety Falls cause injuries and can affect all age groups. It is possible to use preventive measures to significantly decrease the likelihood of falls. There are many simple measures which can make your home safer and prevent falls. OUTDOORS  Repair cracks and edges of walkways and driveways.  Remove high doorway thresholds.  Trim shrubbery on the main path into your home.  Have good outside lighting.  Clear walkways of tools, rocks, debris, and clutter.  Check that handrails are not broken and are securely fastened. Both sides of steps should have handrails.  Have leaves, snow, and ice cleared regularly.  Use sand or salt on walkways during winter months.  In the garage, clean up grease or oil spills. BATHROOM  Install night lights.  Install grab bars by the toilet and in the tub and shower.  Use non-skid mats or decals in the tub or shower.  Place a plastic non-slip stool in the shower to sit on, if needed.  Keep floors dry and clean up all water on the floor immediately.  Remove soap buildup in the tub or shower on a regular basis.  Secure bath mats with non-slip, double-sided rug tape.  Remove throw rugs and tripping hazards from the floors. BEDROOMS  Install night lights.  Make sure a bedside light is easy to reach.  Do not use oversized bedding.  Keep a telephone by your bedside.  Have a firm chair with side arms to use for getting dressed.  Remove throw rugs and tripping hazards from the floor. KITCHEN  Keep handles on pots and pans turned toward the center of the  stove. Use back burners when possible.  Clean up spills quickly and allow time for drying.  Avoid walking on wet floors.  Avoid hot utensils and knives.  Position shelves so they are not too high or low.  Place commonly used objects within easy reach.  If necessary, use a sturdy step stool with a grab bar when reaching.  Keep electrical cables out of the way.  Do not use floor polish or wax that makes floors slippery. If you must use wax, use non-skid floor wax.  Remove throw rugs and tripping hazards from the floor. STAIRWAYS  Never leave objects on stairs.  Place handrails on both sides of stairways and use them. Fix any loose handrails. Make sure handrails on both sides of the stairways are as long as the stairs.  Check carpeting to make sure it is firmly attached along stairs. Make repairs to worn or loose carpet promptly.  Avoid placing throw rugs at the top or bottom of stairways, or properly secure the rug with carpet tape to prevent slippage. Get rid of throw rugs, if possible.  Have an electrician put in a light switch at the top and bottom of the stairs. OTHER FALL PREVENTION TIPS  Wear low-heel or rubber-soled shoes that are supportive and fit well. Wear closed toe shoes.  When using a stepladder, make sure it is fully opened and both spreaders are firmly locked. Do not climb a closed stepladder.  Add color or contrast paint or tape to grab bars and handrails in your home. Place contrasting color strips on first and last steps.  Learn and use mobility aids as needed. Install an electrical emergency response system.  Turn on lights to avoid dark areas. Replace light bulbs that burn out immediately. Get light switches that glow.  Arrange furniture to create clear pathways. Keep furniture in the same place.  Firmly attach carpet with non-skid or double-sided tape.  Eliminate uneven floor surfaces.  Select a carpet pattern that does not visually hide the edge of  steps.  Be aware of all pets. OTHER HOME SAFETY TIPS  Set the water temperature for 120 F (48.8 C).  Keep emergency numbers on or near the telephone.  Keep smoke detectors on every level of the home and near sleeping areas. Document Released: 08/24/2002 Document Revised: 03/04/2012 Document Reviewed: 11/23/2011 [facility] Patient Information 2013 [place], [place].   Hypoglycemia (Low Blood Sugar) Hypoglycemia is when the glucose (sugar) in your blood is too low. Hypoglycemia can happen for many reasons. It can happen to people with or without diabetes. Hypoglycemia can develop quickly and can be a medical emergency.  CAUSES  Having hypoglycemia does not mean that you will develop diabetes. Different causes include:  Missed or delayed meals or not enough carbohydrates eaten.  Medication overdose. This could be by accident or deliberate. If by accident, your medication may need to be adjusted or changed.  Exercise or increased activity without adjustments in carbohydrates or medications.  A nerve disorder that affects body functions like your heart rate, blood pressure and digestion (autonomic neuropathy).  A condition where the stomach muscles do not function properly (gastroparesis). Therefore, medications may not absorb properly.  The inability to recognize the signs of hypoglycemia (hypoglycemic unawareness).  Absorption of insulin  may be altered.  Alcohol consumption.  Pregnancy/menstrual cycles/postpartum. This may be due to hormones.  Certain kinds of tumors. This is very rare. SYMPTOMS   Sweating.  Hunger.  Dizziness.  Blurred vision.  Drowsiness.  Weakness.  Headache.  Rapid heart beat.  Shakiness.  Nervousness. DIAGNOSIS  Diagnosis is made by monitoring blood glucose in one or all of the following ways:  Fingerstick blood glucose monitoring.  Laboratory results. TREATMENT  If you think your blood glucose is low:  Check your blood glucose, if  possible. If it is less than 70 mg/dl, take one of the following:  3-4 glucose tablets.   cup juice (prefer clear like apple).   cup "regular" soda pop.  1 cup milk.  -1 tube of glucose gel.  5-6 hard candies.  Do not over treat because your blood glucose (sugar) will only go too high.  Wait 15 minutes and recheck your blood glucose. If it is still less than 70 mg/dl (or below your target range), repeat treatment.  Eat a snack if it is more than one hour until your next meal. Sometimes, your blood glucose may go so low that you are unable to treat yourself. You may need someone to help you. You may even pass out or be unable to swallow. This may require you to get an injection of glucagon, which raises the blood glucose. HOME CARE INSTRUCTIONS  Check blood glucose as recommended by your caregiver.  Take medication as prescribed by your caregiver.  Follow your meal plan. Do not skip meals. Eat on time.  If you are going to drink alcohol, drink it only with meals.  Check your blood glucose before  driving.  Check your blood glucose before and after exercise. If you exercise longer or different than usual, be sure to check blood glucose more frequently.  Always carry treatment with you. Glucose tablets are the easiest to carry.  Always wear medical alert jewelry or carry some form of identification that states that you have diabetes. This will alert people that you have diabetes. If you have hypoglycemia, they will have a better idea on what to do. SEEK MEDICAL CARE IF:   You are having problems keeping your blood sugar at target range.  You are having frequent episodes of hypoglycemia.  You feel you might be having side effects from your medicines.  You have symptoms of an illness that is not improving after 3-4 days.  You notice a change in vision or a new problem with your vision. SEEK IMMEDIATE MEDICAL CARE IF:   You are a family member or friend of a person whose  blood glucose goes below 70 mg/dl and is accompanied by:  Confusion.  A change in mental status.  The inability to swallow.  Passing out. Document Released: 09/03/2005 Document Revised: 11/26/2011 Document Reviewed: 12/31/2011 [facility] Patient Information 2013 [place], [place].

## 2013-01-06 NOTE — Assessment & Plan Note (Signed)
Good compliance with testosterone supplementation, last testosterone level few months ago normal, PSAs have been stable over time. Plan: Refill of testosterone, check PSA next year.

## 2013-01-07 DIAGNOSIS — M999 Biomechanical lesion, unspecified: Secondary | ICD-10-CM | POA: Diagnosis not present

## 2013-01-07 DIAGNOSIS — M5430 Sciatica, unspecified side: Secondary | ICD-10-CM | POA: Diagnosis not present

## 2013-01-07 DIAGNOSIS — M461 Sacroiliitis, not elsewhere classified: Secondary | ICD-10-CM | POA: Diagnosis not present

## 2013-01-08 DIAGNOSIS — M5430 Sciatica, unspecified side: Secondary | ICD-10-CM | POA: Diagnosis not present

## 2013-01-08 DIAGNOSIS — M999 Biomechanical lesion, unspecified: Secondary | ICD-10-CM | POA: Diagnosis not present

## 2013-01-08 DIAGNOSIS — M461 Sacroiliitis, not elsewhere classified: Secondary | ICD-10-CM | POA: Diagnosis not present

## 2013-02-02 ENCOUNTER — Encounter: Payer: Self-pay | Admitting: Cardiovascular Disease

## 2013-02-05 ENCOUNTER — Ambulatory Visit (INDEPENDENT_AMBULATORY_CARE_PROVIDER_SITE_OTHER): Payer: Medicare Other | Admitting: *Deleted

## 2013-02-05 VITALS — BP 130/70 | HR 68 | Temp 98.6°F

## 2013-02-05 DIAGNOSIS — I10 Essential (primary) hypertension: Secondary | ICD-10-CM

## 2013-02-24 ENCOUNTER — Telehealth: Payer: Self-pay | Admitting: General Practice

## 2013-02-24 NOTE — Telephone Encounter (Signed)
Pt called stating that he had requested a heating pad and back brace from [facility] . This form will be faxed to our office. Wanted to give you the heads up that it is not spam.

## 2013-02-24 NOTE — Telephone Encounter (Signed)
Discussed with pt. Scheduled appt for 6.13.14.

## 2013-02-24 NOTE — Telephone Encounter (Signed)
Forms have been placed on Dr. Leta Jungling desk.

## 2013-02-24 NOTE — Telephone Encounter (Signed)
Needs office visit, we haven't discussed back pain in years. Papers back to you, please keep them  until the patient come back

## 2013-02-27 ENCOUNTER — Ambulatory Visit (INDEPENDENT_AMBULATORY_CARE_PROVIDER_SITE_OTHER): Payer: Medicare Other | Admitting: Internal Medicine

## 2013-02-27 VITALS — BP 118/82 | HR 72 | Temp 97.6°F | Wt 259.0 lb

## 2013-02-27 DIAGNOSIS — M549 Dorsalgia, unspecified: Secondary | ICD-10-CM

## 2013-02-27 NOTE — Progress Notes (Signed)
  Subjective:    Patient ID: Jonathan Beath., male    DOB: 1936-02-19, 77 y.o.   MRN: [id number]  HPI Here to discuss the following issues: Many years history of low back pain, bilateral, no radiation, back pain is there every morning, gets better as the day goes by. The company that provides his glucometer  suggested to get a back brace and heating pad. Also, it a letter from the [place], he has mild erythrocytosis, they plan to check his CBC every few months, they also suggested a UA.  Past Medical History  Diagnosis Date  . CAD (coronary artery disease)     Two RCA stents remotely / 3rd RCA stent 2006  . PVD (peripheral vascular disease)     s/p stents at LE 2009, Dr Jacinto Halim  . Colon polyps     s/p several Cscopes.  . Diabetes mellitus     dx aprox 2009  . Hyperlipidemia     dx in 90s  . Hypertension     dx in the 90  . ED (erectile dysfunction)     has a vacumm device  . Hypogonadism male   . Shortness of breath     O2 Sat dropped to 82% walking on the treadmill, September, 2012  . COPD (chronic obstructive pulmonary disease)     on O2 Rx   Past Surgical History  Procedure Laterality Date  . Appendectomy    . Tonsillectomy     History   Social History  . Marital Status: Married    Spouse Name: N/A    Number of Children: 6  . Years of Education: N/A   Occupational History  . retired     he preaches    Social History Main Topics  . Smoking status: Former Smoker -- 2.00 packs/day for 30 years    Types: Cigarettes    Quit date: 06/17/1986  . Smokeless tobacco: Never Used     Comment: 2 ppd, quit 1987  . Alcohol Use: Yes     Comment: socially   . Drug Use: No  . Sexually Active: Not on file   Other Topics Concern  . Not on file   Social History Narrative   Has 2 step sons, and 4 children    Review of Systems Denies any bladder or bowel incontinence. No LE paresthesias. He sees the chiropractor from time to time, it provides temporizing relief to his  back.     Objective:   Physical Exam BP 118/82  Pulse 72  Temp(Src) 97.6 F (36.4 C) (Oral)  Wt 259 lb (117.482 kg)  BMI 32.82 kg/m2  SpO2 96%  General -- alert, well-developed, NAD .    Lungs -- normal respiratory effort, no intercostal retractions, no accessory muscle use, and decreased  breath sounds.   Heart-- normal rate, regular rhythm, no murmur, and no gallop.    back-- Mild tenderness to palpation of the lower back Extremities-- no pretibial edema bilaterally  Neurologic-- alert & oriented X3 ; Speech, gait, motor symmetric; DTRs decreased throughout . Straight leg test negative.  Psych-- Cognition and judgment appear intact. Alert and cooperative with normal attention span and concentration.  not anxious appearing and not depressed appearing.       Assessment & Plan:  Mild erythrocytosis, Evaluated at the [place], they recommend to check labs from time to time and a urinalysis. We'll proceed with a urinalysis today.

## 2013-02-27 NOTE — Patient Instructions (Addendum)
Will refer you to physical therapy

## 2013-02-27 NOTE — Assessment & Plan Note (Signed)
Ongoing problem for years, exam is benign, no evidence of radiculopathy. Plan: Agree with a heating pad Do not recommend a back brace PT referral.

## 2013-02-28 ENCOUNTER — Encounter: Payer: Self-pay | Admitting: Internal Medicine

## 2013-02-28 LAB — URINALYSIS
Glucose, UA: NEGATIVE mg/dL
Hgb urine dipstick: NEGATIVE
Leukocytes, UA: NEGATIVE
Nitrite: NEGATIVE
Specific Gravity, Urine: 1.014 (ref 1.005–1.030)
pH: 7 (ref 5.0–8.0)

## 2013-03-23 ENCOUNTER — Ambulatory Visit: Payer: Medicare Other

## 2013-03-25 ENCOUNTER — Ambulatory Visit: Payer: Medicare Other

## 2013-04-06 ENCOUNTER — Ambulatory Visit: Payer: Medicare Other | Attending: Internal Medicine | Admitting: Physical Therapy

## 2013-04-06 DIAGNOSIS — IMO0001 Reserved for inherently not codable concepts without codable children: Secondary | ICD-10-CM | POA: Insufficient documentation

## 2013-04-06 DIAGNOSIS — M545 Low back pain, unspecified: Secondary | ICD-10-CM | POA: Insufficient documentation

## 2013-04-09 ENCOUNTER — Ambulatory Visit: Payer: Self-pay | Admitting: Podiatry

## 2013-04-10 ENCOUNTER — Encounter: Payer: Self-pay | Admitting: Podiatry

## 2013-04-10 ENCOUNTER — Ambulatory Visit (INDEPENDENT_AMBULATORY_CARE_PROVIDER_SITE_OTHER): Payer: Medicare Other | Admitting: Podiatry

## 2013-04-10 DIAGNOSIS — B351 Tinea unguium: Secondary | ICD-10-CM

## 2013-04-10 DIAGNOSIS — M79609 Pain in unspecified limb: Secondary | ICD-10-CM

## 2013-04-10 DIAGNOSIS — E109 Type 1 diabetes mellitus without complications: Secondary | ICD-10-CM | POA: Diagnosis not present

## 2013-04-10 DIAGNOSIS — M79676 Pain in unspecified toe(s): Secondary | ICD-10-CM | POA: Insufficient documentation

## 2013-04-10 NOTE — Progress Notes (Signed)
Subjective: 77 year old Diabetic male presents complaining of painful corn 5th digit bilateral.   Objective: Thick dystrophic nails x 10.  Contracted lesser digits bilateral with painful corn on 5th digit bilateral.  Cavus type foot.  All pedal pulses palpable. No skin lesion or acute problems noted.  Assessment: Onychomycosis x 10. Hammer toe deformities 2-5 bilateral. Painful digital corns 5th bilateral.  Treatment: Debrided 5th digital corns. Debrided all nails x 10. Return in 3 months or as needed.

## 2013-04-14 ENCOUNTER — Ambulatory Visit (INDEPENDENT_AMBULATORY_CARE_PROVIDER_SITE_OTHER): Payer: Medicare Other | Admitting: Cardiology

## 2013-04-14 ENCOUNTER — Encounter: Payer: Self-pay | Admitting: Cardiology

## 2013-04-14 VITALS — BP 142/94 | HR 74 | Ht 74.0 in | Wt 261.8 lb

## 2013-04-14 DIAGNOSIS — I2510 Atherosclerotic heart disease of native coronary artery without angina pectoris: Secondary | ICD-10-CM | POA: Diagnosis not present

## 2013-04-14 DIAGNOSIS — I739 Peripheral vascular disease, unspecified: Secondary | ICD-10-CM

## 2013-04-14 NOTE — Assessment & Plan Note (Signed)
Patient has no symptoms. He had a stent to the SFA in 2009. Over time we'll consider followup with the vascular team.

## 2013-04-14 NOTE — Assessment & Plan Note (Signed)
Coronary disease is stable. No further workup. Followup 1 year.

## 2013-04-14 NOTE — Patient Instructions (Addendum)
**Note De-identified Jonathan [name] Obfuscation** Your physician recommends that you continue on your current medications as directed. Please refer to the Current Medication list given to you today.  Your physician wants you to follow-up in: 1 year. You will receive a reminder letter in the mail two months in advance. If you don't receive a letter, please call our office to schedule the follow-up appointment.  

## 2013-04-14 NOTE — Progress Notes (Signed)
HPI  Patient is seen today to follow up coronary disease. He stable. He's not having any chest pain or shortness of breath. He's not having any leg pain.  No Known Allergies  Current Outpatient Prescriptions  Medication Sig Dispense Refill  . amLODipine (NORVASC) 10 MG tablet Take 1 tablet (10 mg total) by mouth daily.  90 tablet  1  . aspirin 81 MG tablet Take 81 mg by mouth daily.        . budesonide-formoterol (SYMBICORT) 160-4.5 MCG/ACT inhaler Inhale 2 puffs into the lungs 2 (two) times daily.  4 Inhaler  0  . clopidogrel (PLAVIX) 75 MG tablet Take 1 tablet (75 mg total) by mouth daily.  90 tablet  1  . glimepiride (AMARYL) 4 MG tablet Take 4 mg by mouth daily before breakfast.      . JANUMET 50-1000 MG per tablet TAKE 1 TABLET BY MOUTH TWICE DAILY WITH MEALS  180 tablet  0  . losartan (COZAAR) 100 MG tablet Take 1 tablet (100 mg total) by mouth daily.  90 tablet  1  . metoprolol succinate (TOPROL-XL) 100 MG 24 hr tablet TAKE 1 TABLET BY MOUTH DAILY  90 tablet  1  . simvastatin (ZOCOR) 20 MG tablet TAKE 1 TABLET BY MOUTH AT BEDTIME  90 tablet  1  . Testosterone 20.25 MG/ACT (1.62%) GEL Place 4 application onto the skin daily.  225 g  1  . tiotropium (SPIRIVA HANDIHALER) 18 MCG inhalation capsule Place 1 capsule (18 mcg total) into inhaler and inhale daily.  10 capsule  0  . triamterene-hydrochlorothiazide (MAXZIDE-25) 37.5-25 MG per tablet Take 1 each (1 tablet total) by mouth daily.  30 tablet  2  . [DISCONTINUED] metFORMIN (GLUCOPHAGE) 1000 MG tablet Take 1 tablet (1,000 mg total) by mouth 2 (two) times daily with a meal.  180 tablet  3  . [DISCONTINUED] sitaGLIPtan (JANUVIA) 100 MG tablet Take 1 tablet (100 mg total) by mouth daily.  90 tablet  1   No current facility-administered medications for this visit.    History   Social History  . Marital Status: Married    Spouse Name: N/A    Number of Children: 6  . Years of Education: N/A   Occupational History  . retired    he preaches    Social History Main Topics  . Smoking status: Former Smoker -- 2.00 packs/day for 30 years    Types: Cigarettes    Quit date: 06/17/1986  . Smokeless tobacco: Never Used     Comment: 2 ppd, quit 1987  . Alcohol Use: Yes     Comment: socially   . Drug Use: No  . Sexually Active: Not on file   Other Topics Concern  . Not on file   Social History Narrative   Has 2 step sons, and 4 children   Diet-- reports eats healthy see a/p             Family History  Problem Relation Age of Onset  . Heart disease Father   . Diabetes      GM, nephews, many family members  . Hyperlipidemia      ?  Marland Kitchen Hypertension Father   . Stroke Father   . Colon cancer Neg Hx   . Prostate cancer Neg Hx     Past Medical History  Diagnosis Date  . CAD (coronary artery disease)     Two RCA stents remotely / 3rd RCA stent 2006  . PVD (peripheral  vascular disease)     s/p stents at LE 2009, Dr Jacinto Halim  . Colon polyps     s/p several Cscopes.  . Diabetes mellitus     dx aprox 2009  . Hyperlipidemia     dx in 90s  . Hypertension     dx in the 90  . ED (erectile dysfunction)     has a vacumm device  . Hypogonadism male   . Shortness of breath     O2 Sat dropped to 82% walking on the treadmill, September, 2012  . COPD (chronic obstructive pulmonary disease)     on O2 Rx    Past Surgical History  Procedure Laterality Date  . Appendectomy    . Tonsillectomy      Patient Active Problem List   Diagnosis Date Noted  . Pain in toe 04/10/2013  . Onychomycosis 04/10/2013  . Essential tremor 10/14/2012  . Medicare annual wellness visit, subsequent 12/11/2011  . Vitamin d deficiency 12/11/2011  . Back pain 06/18/2011  . COPD (chronic obstructive pulmonary disease)   . CAD (coronary artery disease)   . PVD (peripheral vascular disease)   . HEADACHE 10/06/2010  . HYPOGONADISM 06/21/2010  . DIABETES MELLITUS, TYPE I 03/17/2010  . HYPERLIPIDEMIA 03/17/2010  . HYPERTENSION  03/17/2010    ROS   Patient denies fever, chills, headache, sweats, rash, change in vision, change in hearing, chest pain, cough, nausea vomiting, urinary symptoms. All other systems are reviewed and are negative.  PHYSICAL EXAM   Patient is oriented to person time and place. Affect is normal. Lungs are clear. Respiratory effort is nonlabored. Cardiac exam reveals S1 and S2. There no clicks or significant murmurs. Abdomen is soft. There's no peripheral edema.  Filed Vitals:   04/14/13 0852  BP: 142/94  Pulse: 74  Height: 6\' 2"  (1.88 m)  Weight: 261 lb 12.8 oz (118.752 kg)  SpO2: 97%    This  ASSESSMENT & PLAN

## 2013-04-15 ENCOUNTER — Ambulatory Visit: Payer: Medicare Other | Admitting: Physical Therapy

## 2013-04-27 ENCOUNTER — Other Ambulatory Visit: Payer: Self-pay | Admitting: Internal Medicine

## 2013-04-27 NOTE — Telephone Encounter (Signed)
Refill done per protocol.  

## 2013-05-12 ENCOUNTER — Ambulatory Visit: Payer: Medicare Other | Admitting: Internal Medicine

## 2013-05-12 ENCOUNTER — Other Ambulatory Visit: Payer: Self-pay | Admitting: Internal Medicine

## 2013-05-12 NOTE — Telephone Encounter (Signed)
Med filled.  

## 2013-05-16 ENCOUNTER — Other Ambulatory Visit: Payer: Self-pay | Admitting: Internal Medicine

## 2013-05-19 ENCOUNTER — Ambulatory Visit (INDEPENDENT_AMBULATORY_CARE_PROVIDER_SITE_OTHER): Payer: Medicare Other | Admitting: Internal Medicine

## 2013-05-19 ENCOUNTER — Other Ambulatory Visit: Payer: Self-pay | Admitting: Internal Medicine

## 2013-05-19 ENCOUNTER — Encounter: Payer: Self-pay | Admitting: Internal Medicine

## 2013-05-19 VITALS — BP 130/83 | HR 67 | Temp 98.2°F | Wt 259.0 lb

## 2013-05-19 DIAGNOSIS — E291 Testicular hypofunction: Secondary | ICD-10-CM | POA: Diagnosis not present

## 2013-05-19 DIAGNOSIS — E109 Type 1 diabetes mellitus without complications: Secondary | ICD-10-CM | POA: Diagnosis not present

## 2013-05-19 DIAGNOSIS — M549 Dorsalgia, unspecified: Secondary | ICD-10-CM

## 2013-05-19 DIAGNOSIS — J449 Chronic obstructive pulmonary disease, unspecified: Secondary | ICD-10-CM | POA: Diagnosis not present

## 2013-05-19 DIAGNOSIS — I10 Essential (primary) hypertension: Secondary | ICD-10-CM

## 2013-05-19 MED ORDER — TESTOSTERONE 20.25 MG/ACT (1.62%) TD GEL: 4.0000 "application " | Freq: Every day | TRANSDERMAL | Status: DC

## 2013-05-19 MED ORDER — LOSARTAN POTASSIUM 100 MG PO TABS: ORAL_TABLET | ORAL | Status: DC

## 2013-05-19 NOTE — Assessment & Plan Note (Signed)
Status post physical therapy, went once, did not help. States for now he is doing okay and desires no further eval.

## 2013-05-19 NOTE — Assessment & Plan Note (Signed)
Compliance of medication, labs and RF

## 2013-05-19 NOTE — Patient Instructions (Addendum)
Get your blood work before you leave  Next visit in  4 months for a routine office visit Please make an appointment before you leave the office today (or call few weeks in advance)     

## 2013-05-19 NOTE — Assessment & Plan Note (Signed)
Symptoms well-controlled but insurance won't pay for symbicort, he will call with names of other medications covered  under his plan.

## 2013-05-19 NOTE — Assessment & Plan Note (Signed)
BP today well-controlled, ambulatory BPs around 130/70. Plan: No change

## 2013-05-19 NOTE — Assessment & Plan Note (Addendum)
Based on the last A1c, we add that glimepiride, reports no hypoglycemia, plan---- labs. Also, reports she saw the eye doctor not long ago, got good results and was told to come back in one year

## 2013-05-19 NOTE — Telephone Encounter (Signed)
Refill done.  

## 2013-05-19 NOTE — Progress Notes (Signed)
  Subjective:    Patient ID: Jonathan [name]., male    DOB: 1935/11/28, 77 y.o.   MRN: [id number]  HPI Routine office visit Back pain, status post PT eval, did not help much, see assessment and plan. COPD, symptoms well-controlled, needs to change his medication, see assessment and plan. Diabetes--we added glimepiride  based on the last A1c, CBGs running from 98-112. Hypertension, ambulatory BPs very good at around 130/70. Hypogonadism, needs a refill. Due for labs. Good compliance.  Past Medical History  Diagnosis Date  . CAD (coronary artery disease)     Two RCA stents remotely / 3rd RCA stent 2006  . PVD (peripheral vascular disease)     s/p stents at LE 2009, Dr Jacinto Halim  . Colon polyps     s/p several Cscopes.  . Diabetes mellitus     dx aprox 2009  . Hyperlipidemia     dx in 90s  . Hypertension     dx in the 90  . ED (erectile dysfunction)     has a vacumm device  . Hypogonadism male   . Shortness of breath     O2 Sat dropped to 82% walking on the treadmill, September, 2012  . COPD (chronic obstructive pulmonary disease)     on O2 Rx   Past Surgical History  Procedure Laterality Date  . Appendectomy    . Tonsillectomy      Review of Systems Denies chest pain, shortness of breath is at baseline, he uses oxygen at night and during the day as needed. Denies symptoms consistent with hypoglycemia. No nausea, vomiting, diarrhea.     Objective:   Physical Exam BP 130/83  Pulse 67  Temp(Src) 98.2 F (36.8 C) (Oral)  Wt 259 lb (117.482 kg)  BMI 33.24 kg/m2  SpO2 95% General -- alert, well-developed, NAD.  Lungs -- normal respiratory effort, no intercostal retractions, no accessory muscle use, and normal breath sounds.  Heart-- normal rate, regular rhythm, no murmur.  Extremities-- no pretibial edema bilaterally  Neurologic-- alert & oriented X3. Speech, gait normal.  Psych-- Cognition and judgment appear intact. Alert and cooperative with normal attention span  and concentration. not anxious appearing and not depressed appearing.        Assessment & Plan:

## 2013-05-20 LAB — TESTOSTERONE, FREE, TOTAL, SHBG
Sex Hormone Binding: 33 nmol/L (ref 13–71)
Testosterone, Free: 163.8 pg/mL (ref 47.0–244.0)
Testosterone: 729 ng/dL (ref 300–890)

## 2013-05-21 ENCOUNTER — Other Ambulatory Visit: Payer: Self-pay | Admitting: *Deleted

## 2013-05-21 DIAGNOSIS — E119 Type 2 diabetes mellitus without complications: Secondary | ICD-10-CM

## 2013-05-21 MED ORDER — GLIMEPIRIDE 4 MG PO TABS: ORAL_TABLET | ORAL | Status: DC

## 2013-05-21 NOTE — Telephone Encounter (Signed)
rx refilled per protocol. DJR  

## 2013-05-22 ENCOUNTER — Other Ambulatory Visit: Payer: Self-pay | Admitting: Cardiology

## 2013-06-04 ENCOUNTER — Encounter: Payer: Self-pay | Admitting: Internal Medicine

## 2013-06-04 ENCOUNTER — Ambulatory Visit (INDEPENDENT_AMBULATORY_CARE_PROVIDER_SITE_OTHER): Payer: Medicare Other | Admitting: Internal Medicine

## 2013-06-04 ENCOUNTER — Ambulatory Visit (INDEPENDENT_AMBULATORY_CARE_PROVIDER_SITE_OTHER)
Admission: RE | Admit: 2013-06-04 | Discharge: 2013-06-04 | Disposition: A | Discharge status: Home / Self Care | Payer: Medicare Other | Source: Ambulatory Visit | Attending: Internal Medicine | Admitting: Internal Medicine

## 2013-06-04 VITALS — BP 130/68 | HR 69 | Ht 74.0 in | Wt 279.4 lb

## 2013-06-04 DIAGNOSIS — J441 Chronic obstructive pulmonary disease with (acute) exacerbation: Secondary | ICD-10-CM

## 2013-06-04 DIAGNOSIS — Z23 Encounter for immunization: Secondary | ICD-10-CM | POA: Diagnosis not present

## 2013-06-04 DIAGNOSIS — J449 Chronic obstructive pulmonary disease, unspecified: Secondary | ICD-10-CM | POA: Diagnosis not present

## 2013-06-04 DIAGNOSIS — I10 Essential (primary) hypertension: Secondary | ICD-10-CM | POA: Diagnosis not present

## 2013-06-04 MED ORDER — TIOTROPIUM BROMIDE MONOHYDRATE 18 MCG IN CAPS: 18.0000 ug | ORAL_CAPSULE | Freq: Every day | RESPIRATORY_TRACT | Status: DC

## 2013-06-04 NOTE — Patient Instructions (Addendum)
Script sent for Spiriva refill  Flu vax  Order CXR-  Dx COPD  Exercise oximetry on room air here today     Dx COPD  Order-  [facility]  ONOX room air    Dx COPD  Order- [facility] Evaluate for portable O2 concentrator 2L/min       DX COPD

## 2013-06-04 NOTE — Progress Notes (Signed)
05/01/12- 13 yoM former smoker referred by Dr. Drue Novel for SOB.  2 PPD until 1987 Patient c/o sob and wheezing. Denies  chest pain, and chest tightness. He says he has not been as aware of shortness of breath as Dr. Drue Novel. In [employer] service a nurse commented on his labored breathing. He does not snore and does not make loud breathing noises but apparently is perceived to have  increased respiratory effort and some wheeze. This is perennial and may be worse supine. His breathing is not waking him during the night and he denies cough, choking or strangling. He is able to walk long distances. He tried Spiriva and Symbicort without effect. Denies any history of pneumonia, asthma or an anemia. History of myocardial infarction with coronary and femoral stents. He does not notice claudication. PFT- 07/10/11-  Normal spirometry flows with small- airway response to bronchodilator, normal lung volumes and moderately reduced diffusion. FEV1/FVC 0.79, DLCO 58% CXR 03/19/12-reviewed with him CHEST - 2 VIEW  Comparison: 06/28/2008  Findings: The heart size and mediastinal contours are within normal  limits. Both lungs are clear. The visualized skeletal structures  are unremarkable.  IMPRESSION:  Negative exam.  Original Report Authenticated By: Rosealee Albee, M.D.  REST Stress Myoview -05/25/11 Impression  Exercise Capacity: Good exercise capacity.  BP Response: Normal blood pressure response.  Clinical Symptoms: Mild chest pain/dyspnea.  ECG Impression: No significant ST segment change suggestive of ischemia.  Comparison with Prior Nuclear Study: No images to compare  Overall Impression: Small inferior wall infarct with mild periinfarct ischemia   06/18/12- 76 yoM former smoker followed for dyspnea/ labored breathing, complicated by CAD/ MI Gets short of breath lying in bed-may wake him. Denies snoring. Off Spiriva. -06/18/12- 96%, 88%, 94% 449 L. Significant desaturation with exercise.  09/18/12- 82 yoM  former smoker followed for dyspnea/ labored breathing, complicated by CAD/ MI Follows For : pt states having no issues today. states that wife tells him he breaths thru his mouth  at night. pt is on  2L at night/ APS.  Overnight oximetry on room air 06/23/2012 had demonstrated over 5 hours with saturation less than 80% while asleep. We had called to start home oxygen during sleep. He now wants to travel to [place] and then to [place]. I discussed his oxygen status the reason for prescribing oxygen. I am suggesting he get a portable concentrator for this trip.  06/04/13- 77 yoM former smoker followed for dyspnea/ labored breathing, complicated by CAD/ MI FOLLOWS FOR: has been doing good with breathing since wearing O2 2L/ APS at night. Needs O2 recert for O2 uses at night and with travel (pt states he uses O2 during the day while at home if needed) [facility] frequently and often leaves his oxygen behind. Paces himself. Sleeps better at night with oxygen. Frequent wheeze but no productive cough. Continues Symbicort, Spiriva.  ROS-see HPI Constitutional:   No-   weight loss, night sweats, fevers, chills, fatigue, lassitude. HEENT:   No-  headaches, difficulty swallowing, tooth/dental problems, sore throat,       No-  sneezing, itching, ear ache, nasal congestion, post nasal drip,  CV:  No-   chest pain, orthopnea, PND, swelling in lower extremities, anasarca, dizziness, palpitations Resp:   + "sometimes"shortness of breath with exertion or at rest.              No-   productive cough,  No non-productive cough,  No- coughing up of blood.  No-   change in color of mucus.  + wheezing.   Skin: No-   rash or lesions. GI:  No-   heartburn, indigestion, abdominal pain, nausea, vomiting, GU:  MS:  No-   joint pain or swelling.   Neuro-     nothing unusual Psych:  No- change in mood or affect. No depression or anxiety.  No memory loss.  OBJ- Physical Examrest on room air General- Alert,  Oriented, Affect-appropriate, Distress- none acute. Overweight Skin- rash-none, lesions- none, excoriation- none Lymphadenopathy- none Head- atraumatic            Eyes- Gross vision intact, PERRLA, conjunctivae and secretions clear            Ears- Hearing, canals-normal            Nose- Clear, no-Septal dev, mucus, polyps, erosion, perforation             Throat- Mallampati II , mucosa clear , drainage- none, tonsils- atrophic Neck- flexible , trachea midline, no stridor , thyroid nl, carotid no bruit Chest - symmetrical excursion , unlabored           Heart/CV- RRR , no murmur , no gallop  , no rub, nl s1 s2                           - JVD-none , edema- none, stasis changes- none, varices- none           Lung- clear, cough- none , dullness-none, rub- none           Chest wall-  Abd-  Br/ Gen/ Rectal- Not done, not indicated Extrem- cyanosis- none, clubbing?- Question mild spooning of nails; atrophy- none, strength- nl Neuro- grossly intact to observation

## 2013-06-08 NOTE — Progress Notes (Signed)
Quick Note:  Informed pt's wife Mrs. Pizzini of CXR results and she verbalized understanding and has no further questions or concerns at this time ______

## 2013-06-14 NOTE — Assessment & Plan Note (Signed)
We need to clarify his current oxygen status. Plan-chest x-ray, exercise oximetry. Portable concentrator for travel. Overnight oximetry on room air to recertify. Refill Spiriva

## 2013-07-10 ENCOUNTER — Ambulatory Visit (INDEPENDENT_AMBULATORY_CARE_PROVIDER_SITE_OTHER): Payer: Medicare Other | Admitting: Podiatry

## 2013-07-10 ENCOUNTER — Encounter: Payer: Self-pay | Admitting: Podiatry

## 2013-07-10 DIAGNOSIS — M79609 Pain in unspecified limb: Secondary | ICD-10-CM

## 2013-07-10 DIAGNOSIS — E109 Type 1 diabetes mellitus without complications: Secondary | ICD-10-CM | POA: Diagnosis not present

## 2013-07-10 DIAGNOSIS — B351 Tinea unguium: Secondary | ICD-10-CM | POA: Diagnosis not present

## 2013-07-10 NOTE — Patient Instructions (Signed)
Seen for corns and toe nails. Nails and corns debrided. No new problems noted. Return in 3 months or as needed.

## 2013-07-10 NOTE — Progress Notes (Signed)
Subjective:  77 year old Diabetic male presents complaining of painful corn 5th digit bilateral toe nails trimmed.   Objective:  Thick dystrophic nails x 10.  Contracted lesser digits bilateral with painful corn on 5th digit bilateral.  Cavus type foot.  All pedal pulses palpable. No skin lesion or acute problems noted.   Assessment: Onychomycosis x 10.  Hammer toe deformities 2-5 bilateral.  Painful digital corns 5th bilateral.   Treatment:  Debrided 5th digital corns. Debrided all nails x 10.  Return in 3 months or as needed.

## 2013-07-23 ENCOUNTER — Other Ambulatory Visit: Payer: Self-pay | Admitting: Internal Medicine

## 2013-07-23 NOTE — Telephone Encounter (Signed)
Triamterene-hydrochlorothiazide refill sent to pharmacy 

## 2013-07-30 ENCOUNTER — Other Ambulatory Visit: Payer: Self-pay | Admitting: Internal Medicine

## 2013-07-30 NOTE — Telephone Encounter (Signed)
Metoprolol refilled.

## 2013-09-18 ENCOUNTER — Other Ambulatory Visit: Payer: Self-pay | Admitting: Internal Medicine

## 2013-09-18 NOTE — Telephone Encounter (Signed)
rx refilled per protocol. DJR  

## 2013-09-22 ENCOUNTER — Encounter: Payer: Self-pay | Admitting: Internal Medicine

## 2013-09-22 ENCOUNTER — Ambulatory Visit (INDEPENDENT_AMBULATORY_CARE_PROVIDER_SITE_OTHER): Payer: Medicare Other | Admitting: Internal Medicine

## 2013-09-22 VITALS — BP 131/76 | HR 72 | Temp 98.0°F | Wt 264.0 lb

## 2013-09-22 DIAGNOSIS — E109 Type 1 diabetes mellitus without complications: Secondary | ICD-10-CM | POA: Diagnosis not present

## 2013-09-22 DIAGNOSIS — J449 Chronic obstructive pulmonary disease, unspecified: Secondary | ICD-10-CM | POA: Diagnosis not present

## 2013-09-22 DIAGNOSIS — E291 Testicular hypofunction: Secondary | ICD-10-CM

## 2013-09-22 DIAGNOSIS — E785 Hyperlipidemia, unspecified: Secondary | ICD-10-CM

## 2013-09-22 DIAGNOSIS — I10 Essential (primary) hypertension: Secondary | ICD-10-CM

## 2013-09-22 LAB — CBC WITH DIFFERENTIAL/PLATELET
BASOS PCT: 0.4 % (ref 0.0–3.0)
Basophils Absolute: 0 10*3/uL (ref 0.0–0.1)
EOS PCT: 2.9 % (ref 0.0–5.0)
Eosinophils Absolute: 0.3 10*3/uL (ref 0.0–0.7)
HCT: 51.3 % (ref 39.0–52.0)
Hemoglobin: 17.5 g/dL — ABNORMAL HIGH (ref 13.0–17.0)
LYMPHS PCT: 26.7 % (ref 12.0–46.0)
Lymphs Abs: 2.3 10*3/uL (ref 0.7–4.0)
MCHC: 34 g/dL (ref 30.0–36.0)
MCV: 95 fl (ref 78.0–100.0)
Monocytes Absolute: 0.9 10*3/uL (ref 0.1–1.0)
Monocytes Relative: 10.3 % (ref 3.0–12.0)
NEUTROS PCT: 59.7 % (ref 43.0–77.0)
Neutro Abs: 5.2 10*3/uL (ref 1.4–7.7)
Platelets: 188 10*3/uL (ref 150.0–400.0)
RBC: 5.41 Mil/uL (ref 4.22–5.81)
RDW: 15.4 % — ABNORMAL HIGH (ref 11.5–14.6)
WBC: 8.8 10*3/uL (ref 4.5–10.5)

## 2013-09-22 LAB — BASIC METABOLIC PANEL
BUN: 15 mg/dL (ref 6–23)
CHLORIDE: 103 meq/L (ref 96–112)
CO2: 27 meq/L (ref 19–32)
CREATININE: 1.5 mg/dL (ref 0.4–1.5)
Calcium: 9.4 mg/dL (ref 8.4–10.5)
GFR: 59.04 mL/min — ABNORMAL LOW (ref >60.00)
Glucose, Bld: 142 mg/dL — ABNORMAL HIGH (ref 70–99)
POTASSIUM: 3.7 meq/L (ref 3.5–5.1)
Sodium: 138 mEq/L (ref 135–145)

## 2013-09-22 LAB — LIPID PANEL
CHOL/HDL RATIO: 4
Cholesterol: 129 mg/dL (ref 0–200)
HDL: 35.8 mg/dL — ABNORMAL LOW (ref >39.00)
LDL CALC: 75 mg/dL (ref 0–99)
Triglycerides: 92 mg/dL (ref 0.0–149.0)
VLDL: 18.4 mg/dL (ref 0.0–40.0)

## 2013-09-22 LAB — ALT: ALT: 29 U/L (ref 0–53)

## 2013-09-22 LAB — AST: AST: 23 U/L (ref 0–37)

## 2013-09-22 LAB — HEMOGLOBIN A1C: Hgb A1c MFr Bld: 7.6 % — ABNORMAL HIGH (ref 4.6–6.5)

## 2013-09-22 MED ORDER — TESTOSTERONE 20.25 MG/ACT (1.62%) TD GEL: 4.0000 "application " | Freq: Every day | TRANSDERMAL | Status: DC

## 2013-09-22 NOTE — Assessment & Plan Note (Signed)
BP well-controlled, good medication compliance, due for a BMP

## 2013-09-22 NOTE — Assessment & Plan Note (Signed)
Last A1c >7 so I recommend to increase glimepiride from 4 mg to 6 mg but the patient didn't. Currently CBGs okay at around 115. Plan: Continue present care, check A1c, goal A1c below 7

## 2013-09-22 NOTE — Assessment & Plan Note (Signed)
Due for labs

## 2013-09-22 NOTE — Progress Notes (Signed)
Pre visit review using our clinic review tool, if applicable. No additional management support is needed unless otherwise documented below in the visit note. 

## 2013-09-22 NOTE — Progress Notes (Signed)
   Subjective:    Patient ID: Jonathan [name]., male    DOB: [birth year], 78 y.o.   MRN: [id number]  HPI ROV, today we discussed the following issues: Hypogonadism--needs a refill on testosterone Diabetes--based on the last A1c we rec to  increase the dose of glimepiride but the patient didn't. Good compliance with meds otherwise, ambulatory blood sugars around 118, he only occasionally experiences low blood sugar symptoms when he skips a meal (recommend not to) COPD--well-controlled on Spiriva, very seldom uses "the other inhaler ". Hypertension--good compliance with medications, ambulatory BPs reportedly normal  Past Medical History  Diagnosis Date  . CAD (coronary artery disease)     Two RCA stents remotely / 3rd RCA stent 2006  . PVD (peripheral vascular disease)     s/p stents at LE 2009, Dr Einar Gip  . Colon polyps     s/p several Cscopes.  . Diabetes mellitus     dx aprox 2009  . Hyperlipidemia     dx in 90s  . Hypertension     dx in the 90  . ED (erectile dysfunction)     has a vacumm device  . Hypogonadism male   . Shortness of breath     O2 Sat dropped to 82% walking on the treadmill, September, 2012  . COPD (chronic obstructive pulmonary disease)     on O2 Rx   Past Surgical History  Procedure Laterality Date  . Appendectomy    . Tonsillectomy       Review of Systems Denies chest pain, shortness or breath at baseline, uses oxygen at night. No sputum production. Denies nausea, vomiting, diarrhea    Objective:   Physical Exam BP 131/76  Pulse 72  Temp(Src) 98 F (36.7 C)  Wt 264 lb (119.75 kg)  SpO2 93% General -- alert, well-developed, NAD.   Lungs -- normal respiratory effort, no intercostal retractions, no accessory muscle use, and normal breath sounds.  Heart-- normal rate, regular rhythm, no murmur.   Extremities-- no pretibial edema bilaterally  Neurologic--  alert & oriented X3. Speech normal, gait normal, strength normal in all extremities.    Psych-- Cognition and judgment appear intact. Cooperative with normal attention span and concentration. No anxious or depressed appearing.      Assessment & Plan:

## 2013-09-22 NOTE — Assessment & Plan Note (Signed)
Needs a refill

## 2013-09-22 NOTE — Patient Instructions (Signed)
Get your blood work before you leave   Next visit is for a physical exam in 4 months  no need to come back fasting Please make an appointment

## 2013-09-22 NOTE — Assessment & Plan Note (Signed)
Well-controlled at the present time on Spiriva, hardly ever uses his "other inhaler" (?albuterol-- pt will clarify)

## 2013-09-23 ENCOUNTER — Telehealth: Payer: Self-pay

## 2013-09-23 ENCOUNTER — Telehealth: Payer: Self-pay | Admitting: Internal Medicine

## 2013-09-23 NOTE — Telephone Encounter (Signed)
Relevant patient education assigned to patient using Emmi. ° °

## 2013-10-09 ENCOUNTER — Ambulatory Visit (INDEPENDENT_AMBULATORY_CARE_PROVIDER_SITE_OTHER): Payer: Medicare Other | Admitting: Podiatry

## 2013-10-09 ENCOUNTER — Encounter: Payer: Self-pay | Admitting: Podiatry

## 2013-10-09 VITALS — BP 141/81 | HR 70 | Ht 74.0 in | Wt 258.0 lb

## 2013-10-09 DIAGNOSIS — M79609 Pain in unspecified limb: Secondary | ICD-10-CM

## 2013-10-09 DIAGNOSIS — B351 Tinea unguium: Secondary | ICD-10-CM

## 2013-10-09 DIAGNOSIS — M79676 Pain in unspecified toe(s): Secondary | ICD-10-CM

## 2013-10-09 NOTE — Patient Instructions (Signed)
Seen for hypertrophic nails. All nails debrided. Return in 3 months or as needed.  

## 2013-10-09 NOTE — Progress Notes (Signed)
Subjective:  78 year old Diabetic male presents complaining of painful corn 5th digit bilateral toe nails trimmed.   Objective:  Thick dystrophic nails x 10.  Contracted lesser digits bilateral with painful corn on 5th digit on right foot  Cavus type foot.  All pedal pulses palpable. No skin lesion or acute problems noted.   Assessment: Onychomycosis x 10.  Hammer toe deformities 2-5 bilateral.  Painful digital corns 5th right.  Treatment:  Debrided 5th digital corn and all nails x 10.  Return in 3 months or as needed.

## 2013-10-10 ENCOUNTER — Other Ambulatory Visit: Payer: Self-pay | Admitting: Internal Medicine

## 2013-10-12 ENCOUNTER — Other Ambulatory Visit: Payer: Self-pay | Admitting: Internal Medicine

## 2013-10-16 ENCOUNTER — Telehealth: Payer: Self-pay | Admitting: Internal Medicine

## 2013-10-16 NOTE — Telephone Encounter (Signed)
Relevant patient education mailed to patient.  

## 2013-10-27 ENCOUNTER — Telehealth: Payer: Self-pay | Admitting: *Deleted

## 2013-10-27 NOTE — Telephone Encounter (Signed)
Pt requesting new medication - symbicort 160 / 4.5.

## 2013-10-29 ENCOUNTER — Telehealth: Payer: Self-pay | Admitting: *Deleted

## 2013-10-29 ENCOUNTER — Telehealth: Payer: Self-pay | Admitting: Internal Medicine

## 2013-10-29 MED ORDER — TIOTROPIUM BROMIDE MONOHYDRATE 18 MCG IN CAPS: 18.0000 ug | ORAL_CAPSULE | Freq: Every day | RESPIRATORY_TRACT | Status: DC

## 2013-10-29 NOTE — Telephone Encounter (Signed)
Rx has been sent in. Pt is aware. Pt requests samples until he medication comes in. Samples will be left at the front desk. Nothing further is needed.

## 2013-10-29 NOTE — Telephone Encounter (Signed)
Pt states yesterday blood sugar dropped to 70 in the evening before eating, then after dinner back to 109. Was told to let us know if sugars dropped below 90. increased glimepiride 4 mg from one tablet daily to 2 tablets every AM in January.  Pt states reading have been up and down lately. Please advise

## 2013-10-29 NOTE — Telephone Encounter (Signed)
CBGs in the morning 120, 118 CBG one time at night was 74, had symptoms, symptoms decreased after eating. CBG one time was 205. Patient admits that sometimes skips meals. Last A1c more than 7 Plan: Do not skip meals, if low CBGs continue happening more than 2 times a week call me, will decrease Amaryl  from 8 mg to 6 mg in the morning

## 2013-10-29 NOTE — Telephone Encounter (Signed)
Patient got Spiriva from pulmonary, no further needs

## 2013-11-16 ENCOUNTER — Telehealth: Payer: Self-pay | Admitting: Internal Medicine

## 2013-11-16 MED ORDER — BUDESONIDE-FORMOTEROL FUMARATE 160-4.5 MCG/ACT IN AERO: 2.0000 | INHALATION_SPRAY | Freq: Two times a day (BID) | RESPIRATORY_TRACT | Status: DC

## 2013-11-16 NOTE — Telephone Encounter (Signed)
Pt is needing refill on Symbicort. Paperwork is for refill. Advised pt that I will send this in to [facility]. Nothing further was needed at this time.

## 2013-11-23 ENCOUNTER — Other Ambulatory Visit: Payer: Self-pay | Admitting: Internal Medicine

## 2013-11-29 ENCOUNTER — Other Ambulatory Visit: Payer: Self-pay | Admitting: Internal Medicine

## 2013-11-30 DIAGNOSIS — IMO0001 Reserved for inherently not codable concepts without codable children: Secondary | ICD-10-CM | POA: Diagnosis not present

## 2013-11-30 DIAGNOSIS — M999 Biomechanical lesion, unspecified: Secondary | ICD-10-CM | POA: Diagnosis not present

## 2013-11-30 DIAGNOSIS — M461 Sacroiliitis, not elsewhere classified: Secondary | ICD-10-CM | POA: Diagnosis not present

## 2013-12-01 DIAGNOSIS — IMO0001 Reserved for inherently not codable concepts without codable children: Secondary | ICD-10-CM | POA: Diagnosis not present

## 2013-12-01 DIAGNOSIS — M999 Biomechanical lesion, unspecified: Secondary | ICD-10-CM | POA: Diagnosis not present

## 2013-12-01 DIAGNOSIS — M461 Sacroiliitis, not elsewhere classified: Secondary | ICD-10-CM | POA: Diagnosis not present

## 2013-12-08 ENCOUNTER — Ambulatory Visit (INDEPENDENT_AMBULATORY_CARE_PROVIDER_SITE_OTHER): Payer: Medicare Other | Admitting: Internal Medicine

## 2013-12-08 ENCOUNTER — Encounter: Payer: Self-pay | Admitting: Internal Medicine

## 2013-12-08 VITALS — BP 130/80 | HR 76 | Ht 74.0 in | Wt 262.0 lb

## 2013-12-08 DIAGNOSIS — J449 Chronic obstructive pulmonary disease, unspecified: Secondary | ICD-10-CM | POA: Diagnosis not present

## 2013-12-08 DIAGNOSIS — R0902 Hypoxemia: Secondary | ICD-10-CM

## 2013-12-08 NOTE — Progress Notes (Signed)
05/01/12- 33 yoM former smoker referred by Dr. Larose Kells for SOB.  2 PPD until 1987 Patient c/o sob and wheezing. Denies  chest pain, and chest tightness. He says he has not been as aware of shortness of breath as Dr. Larose Kells. In [employer] service a nurse commented on his labored breathing. He does not snore and does not make loud breathing noises but apparently is perceived to have  increased respiratory effort and some wheeze. This is perennial and may be worse supine. His breathing is not waking him during the night and he denies cough, choking or strangling. He is able to walk long distances. He tried Spiriva and Symbicort without effect. Denies any history of pneumonia, asthma or an anemia. History of myocardial infarction with coronary and femoral stents. He does not notice claudication. PFT- 07/10/11-  Normal spirometry flows with small- airway response to bronchodilator, normal lung volumes and moderately reduced diffusion. FEV1/FVC 0.79, DLCO 58% CXR 03/19/12-reviewed with him CHEST - 2 VIEW  Comparison: 06/28/2008  Findings: The heart size and mediastinal contours are within normal  limits. Both lungs are clear. The visualized skeletal structures  are unremarkable.  IMPRESSION:  Negative exam.  Original Report Authenticated By: Angelita Ingles, M.D.  REST Stress Myoview -05/25/11 Impression  Exercise Capacity: Good exercise capacity.  BP Response: Normal blood pressure response.  Clinical Symptoms: Mild chest pain/dyspnea.  ECG Impression: No significant ST segment change suggestive of ischemia.  Comparison with Prior Nuclear Study: No images to compare  Overall Impression: Small inferior wall infarct with mild periinfarct ischemia   06/18/12- 76 yoM former smoker followed for dyspnea/ labored breathing, complicated by CAD/ MI Gets short of breath lying in bed-may wake him. Denies snoring. Off Spiriva. 6MWT -06/18/12- 96%, 88%, 94% 449 M. Significant desaturation with exercise.  09/18/12- 76 yoM  former smoker followed for dyspnea/ labored breathing, complicated by CAD/ MI Follows For : pt states having no issues today. states that wife tells him he breaths thru his mouth  at night. pt is on  2L at night/ APS.  Overnight oximetry on room air 06/23/2012 had demonstrated over 5 hours with saturation less than 80% while asleep. We had called to start home oxygen during sleep. He now wants to travel to [place] and then to [place]. I discussed his oxygen status the reason for prescribing oxygen. I am suggesting he get a portable concentrator for this trip.  06/04/13- 61 yoM former smoker followed for dyspnea/ labored breathing, complicated by CAD/ MI FOLLOWS FOR: has been doing good with breathing since wearing O2 2L/ APS at night. Needs O2 recert for O2 uses at night and with travel (pt states he uses O2 during the day while at home if needed) [facility] frequently and often leaves his oxygen behind. Paces himself. Sleeps better at night with oxygen. Frequent wheeze but no productive cough. Continues Symbicort, Spiriva.  12/08/13- 38 yoM former smoker followed for dyspnea/ labored breathing, complicated by CAD/ MI FOLLOWS FOR: Tries to stay active but continues to have SOB. Would like to discuss O2 options-DMe is APS. Needs something smaller and lightweight. Reviewed PFT, ONOX and 6MWT. Desats with sleep and exertion. Nearly normal PFT except low DLCO. No hx PE or murmur. + hx MI and PAD. He uses standard concentrator for sleep, older portable concentrator as needed during activity and for travel. CXR 06/08/13 IMPRESSION:  No active cardiopulmonary disease.  Electronically Signed  By: Lajean Manes  On: 06/04/2013 10:59   ROS-see HPI Constitutional:   No-  weight loss, night sweats, fevers, chills, fatigue, lassitude. HEENT:   No-  headaches, difficulty swallowing, tooth/dental problems, sore throat,       No-  sneezing, itching, ear ache, nasal congestion, post nasal drip,  CV:  No-   chest  pain, orthopnea, PND, swelling in lower extremities, anasarca, dizziness, palpitations Resp:   + "sometimes"shortness of breath with exertion or at rest.              No-   productive cough,  No non-productive cough,  No- coughing up of blood.              No-   change in color of mucus.  + wheezing.   Skin: No-   rash or lesions. GI:  No-   heartburn, indigestion, abdominal pain, nausea, vomiting, GU:  MS:  No-   joint pain or swelling.   Neuro-     nothing unusual Psych:  No- change in mood or affect. No depression or anxiety.  No memory loss.  OBJ- Physical Examrest on room air General- Alert, Oriented, Affect-appropriate, Distress- none acute. Overweight Skin- rash-none, lesions- none, excoriation- none Lymphadenopathy- none Head- atraumatic            Eyes- Gross vision intact, PERRLA, conjunctivae and secretions clear            Ears- Hearing, canals-normal            Nose- Clear, no-Septal dev, mucus, polyps, erosion, perforation             Throat- Mallampati II , mucosa clear , drainage- none, tonsils- atrophic Neck- flexible , trachea midline, no stridor , thyroid nl, carotid no bruit Chest - symmetrical excursion , unlabored           Heart/CV- RRR , no murmur , no gallop  , no rub, nl s1 s2                           - JVD-none , edema- none, stasis changes- none, varices- none           Lung- clear, cough+light , dullness-none, rub- none           Chest wall-  Abd-  Br/ Gen/ Rectal- Not done, not indicated Extrem- cyanosis- none, clubbing?- Question mild spooning of nails; atrophy- none, strength- nl Neuro- grossly intact to observation

## 2013-12-08 NOTE — Assessment & Plan Note (Signed)
Primary pulmonary defect is low Diffusion Capacity. This is probably COPD/ emphysema in terminal airways. No obvious R heart failure, but wonder if he has ever had echo for PHTN, EF. We can ask Dr Ron Parker cardiology whether this would be helpful. Plan- DME evaluate for lighter portable O2 if available.

## 2013-12-08 NOTE — Patient Instructions (Addendum)
Order- DME APS- evaluate for lighter portable O2 options/ lighter concentrator  2L/ min     Dx COPD, hypoxemia  We will ask Dr Ron Parker if an echocardiogram would be appropriate for helping to assess your oxygen desaturation during exertion.

## 2013-12-09 ENCOUNTER — Encounter: Payer: Self-pay | Admitting: Cardiology

## 2013-12-09 ENCOUNTER — Telehealth: Payer: Self-pay

## 2013-12-09 DIAGNOSIS — R0600 Dyspnea, unspecified: Secondary | ICD-10-CM

## 2013-12-09 NOTE — Telephone Encounter (Addendum)
Per Dr Ron Parker and due to recent OV with Dr Annamaria Boots, the pt is advised that he needs to have an Echo due to SOB and a f/u with Dr Ron Parker 2 weeks after test. The pt verbalized understanding and states that he was aware that Dr Annamaria Boots was going to contact Dr Ron Parker concerning this Echo. Echo has been ordered and a message sent to [facility] to call the pt to schedule Echo and a f/u with Dr Ron Parker 2 weeks after echo.

## 2013-12-09 NOTE — Progress Notes (Signed)
I reviewed the note from the office visit with Dr. Annamaria Boots. I do not see a prior 2-D echo. I will arrange for 2-D echo asking specifically to look for left and right heart function and pulmonary artery pressure. Home and see the patient in the office for followup. He was doing one year from July.  Daryel November

## 2013-12-15 ENCOUNTER — Telehealth: Payer: Self-pay | Admitting: Internal Medicine

## 2013-12-15 DIAGNOSIS — H2510 Age-related nuclear cataract, unspecified eye: Secondary | ICD-10-CM | POA: Diagnosis not present

## 2013-12-15 DIAGNOSIS — J449 Chronic obstructive pulmonary disease, unspecified: Secondary | ICD-10-CM

## 2013-12-15 DIAGNOSIS — E119 Type 2 diabetes mellitus without complications: Secondary | ICD-10-CM | POA: Diagnosis not present

## 2013-12-15 LAB — HM DIABETES EYE EXAM

## 2013-12-15 NOTE — Telephone Encounter (Signed)
I called and spoke with Maudie Mercury from [employer]. She needs an order to state the above in order for pt insurance to pay. i have placed order. Nothing further needed

## 2013-12-16 ENCOUNTER — Other Ambulatory Visit: Payer: Self-pay | Admitting: Internal Medicine

## 2013-12-16 ENCOUNTER — Ambulatory Visit (HOSPITAL_COMMUNITY): Payer: Medicare Other | Attending: Cardiology | Admitting: Radiology

## 2013-12-16 DIAGNOSIS — R0989 Other specified symptoms and signs involving the circulatory and respiratory systems: Secondary | ICD-10-CM | POA: Diagnosis not present

## 2013-12-16 DIAGNOSIS — I2510 Atherosclerotic heart disease of native coronary artery without angina pectoris: Secondary | ICD-10-CM

## 2013-12-16 DIAGNOSIS — R0602 Shortness of breath: Secondary | ICD-10-CM | POA: Diagnosis not present

## 2013-12-16 DIAGNOSIS — R0600 Dyspnea, unspecified: Secondary | ICD-10-CM

## 2013-12-16 DIAGNOSIS — R0609 Other forms of dyspnea: Secondary | ICD-10-CM | POA: Diagnosis not present

## 2013-12-16 NOTE — Progress Notes (Signed)
Echocardiogram performed.  

## 2013-12-21 ENCOUNTER — Encounter: Payer: Self-pay | Admitting: Cardiology

## 2013-12-21 ENCOUNTER — Encounter: Payer: Self-pay | Admitting: Internal Medicine

## 2013-12-21 DIAGNOSIS — R9430 Abnormal result of cardiovascular function study, unspecified: Secondary | ICD-10-CM | POA: Insufficient documentation

## 2013-12-23 ENCOUNTER — Other Ambulatory Visit: Payer: Self-pay | Admitting: Internal Medicine

## 2014-01-04 ENCOUNTER — Encounter: Payer: Self-pay | Admitting: Cardiology

## 2014-01-04 ENCOUNTER — Ambulatory Visit (INDEPENDENT_AMBULATORY_CARE_PROVIDER_SITE_OTHER): Payer: Medicare Other | Admitting: Cardiology

## 2014-01-04 VITALS — BP 140/80 | HR 71 | Ht 74.0 in | Wt 261.1 lb

## 2014-01-04 DIAGNOSIS — J449 Chronic obstructive pulmonary disease, unspecified: Secondary | ICD-10-CM | POA: Diagnosis not present

## 2014-01-04 DIAGNOSIS — I2510 Atherosclerotic heart disease of native coronary artery without angina pectoris: Secondary | ICD-10-CM

## 2014-01-04 DIAGNOSIS — E785 Hyperlipidemia, unspecified: Secondary | ICD-10-CM

## 2014-01-04 DIAGNOSIS — I10 Essential (primary) hypertension: Secondary | ICD-10-CM

## 2014-01-04 DIAGNOSIS — I739 Peripheral vascular disease, unspecified: Secondary | ICD-10-CM

## 2014-01-04 NOTE — Assessment & Plan Note (Signed)
Patient had stents in the past. Nuclear study in September, 2012 revealed an old small inferior infarct with mild peri-infarct ischemia. Ejection fraction was 55%. His original symptom was chest burning. He has not had any of this. No further workup at this time.

## 2014-01-04 NOTE — Assessment & Plan Note (Addendum)
The patient's recent LDL is 75. He is on 20 mg of simvastatin. He is 78 years of age. The guidelines call for higher dosing of statins in people of  younger age. I've chosen not to make a change because his LDL is under good control.

## 2014-01-04 NOTE — Progress Notes (Signed)
Patient ID: Jonathan Leather., male   DOB: [birth year], 78 y.o.   MRN: [id number]    HPI  Patient is seen today to followup coronary disease. When he saw Dr. Annamaria Boots last, suggestion was sent to me to consider reassessing his right ventricular function and pulmonary artery pressures by echo. It was felt that the primary pulmonary defect was low diffusion capacity. This is probably from COPD/emphysema and terminal airways. However, it was felt that an echo to reassess would be appropriate. It was done in March, 2015. The ejection fraction is in the 50-55% range. Right ventricular size and function appear to be good. There was no significant tricuspid regurgitation. Therefore right heart pressure could not be estimated. However this argues against significant pulmonary hypertension.   The patient does have coronary disease. He underwent an intervention in 2006. His original symptom was burning in the chest. He is not having this. His symptom is exertional shortness of breath.  No Known Allergies  Current Outpatient Prescriptions  Medication Sig Dispense Refill  . albuterol (VENTOLIN HFA) 108 (90 BASE) MCG/ACT inhaler Inhale 2 puffs into the lungs every 6 (six) hours as needed for wheezing or shortness of breath.      Marland Kitchen amLODipine (NORVASC) 10 MG tablet TAKE 1 TABLET DAILY  90 tablet  3  . aspirin 81 MG tablet Take 81 mg by mouth daily.        . budesonide-formoterol (SYMBICORT) 160-4.5 MCG/ACT inhaler Inhale 2 puffs into the lungs 2 (two) times daily.  3 Inhaler  1  . clopidogrel (PLAVIX) 75 MG tablet TAKE 1 TABLET DAILY  90 tablet  1  . glimepiride (AMARYL) 4 MG tablet take 2 tablets daily      . JANUMET 50-1000 MG per tablet TAKE 1 TABLET TWICE A DAY WITH MEALS  180 tablet  1  . losartan (COZAAR) 100 MG tablet TAKE 1 TABLET DAILY  90 tablet  2  . metoprolol succinate (TOPROL-XL) 100 MG 24 hr tablet TAKE 1 TABLET DAILY  90 tablet  1  . simvastatin (ZOCOR) 20 MG tablet TAKE 1 TABLET AT BEDTIME  90  tablet  0  . Testosterone 20.25 MG/ACT (1.62%) GEL Place 4 application onto the skin daily.  225 g  1  . tiotropium (SPIRIVA HANDIHALER) 18 MCG inhalation capsule Place 1 capsule (18 mcg total) into inhaler and inhale daily.  90 capsule  1  . triamterene-hydrochlorothiazide (MAXZIDE-25) 37.5-25 MG per tablet TAKE 1 TABLET DAILY  90 tablet  1  . [DISCONTINUED] metFORMIN (GLUCOPHAGE) 1000 MG tablet Take 1 tablet (1,000 mg total) by mouth 2 (two) times daily with a meal.  180 tablet  3  . [DISCONTINUED] sitaGLIPtan (JANUVIA) 100 MG tablet Take 1 tablet (100 mg total) by mouth daily.  90 tablet  1   No current facility-administered medications for this visit.    History   Social History  . Marital Status: Married    Spouse Name: N/A    Number of Children: 6  . Years of Education: N/A   Occupational History  . retired     he preaches    Social History Main Topics  . Smoking status: Former Smoker -- 2.00 packs/day for 30 years    Types: Cigarettes    Quit date: 06/17/1977  . Smokeless tobacco: Never Used     Comment: 2 ppd, quit 1987  . Alcohol Use: Yes     Comment: socially   . Drug Use: No  . Sexual Activity:  Not on file   Other Topics Concern  . Not on file   Social History Narrative   Has 2 step sons, and 4 children   Diet-- reports eats healthy see a/p             Family History  Problem Relation Age of Onset  . Heart disease Father   . Diabetes      GM, nephews, many family members  . Hyperlipidemia      ?  Marland Kitchen Hypertension Father   . Stroke Father   . Colon cancer Neg Hx   . Prostate cancer Neg Hx     Past Medical History  Diagnosis Date  . CAD (coronary artery disease)     Two RCA stents remotely / 3rd RCA stent 2006  . PVD (peripheral vascular disease)     s/p stents at LE 2009, Dr Einar Gip  . Colon polyps     s/p several Cscopes.  . Diabetes mellitus     dx aprox 2009  . Hyperlipidemia     dx in 90s  . Hypertension     dx in the 90  . ED (erectile  dysfunction)     has a vacumm device  . Hypogonadism male   . Shortness of breath     O2 Sat dropped to 82% walking on the treadmill, September, 2012  . COPD (chronic obstructive pulmonary disease)     on O2 Rx  . Ejection fraction     Past Surgical History  Procedure Laterality Date  . Appendectomy    . Tonsillectomy      Patient Active Problem List   Diagnosis Date Noted  . Ejection fraction   . Pain in toe 04/10/2013  . Onychomycosis 04/10/2013  . Essential tremor 10/14/2012  . Medicare annual wellness visit, subsequent 12/11/2011  . Vitamin d deficiency 12/11/2011  . Back pain 06/18/2011  . COPD (chronic obstructive pulmonary disease)   . CAD (coronary artery disease)   . PVD (peripheral vascular disease)   . HEADACHE 10/06/2010  . HYPOGONADISM 06/21/2010  . DIABETES MELLITUS, TYPE I 03/17/2010  . HYPERLIPIDEMIA 03/17/2010  . HYPERTENSION 03/17/2010    ROS   Patient denies fever, chills, headache, sweats, rash, change in vision, change in hearing, chest pain, nausea vomiting, urinary symptoms. All other systems are reviewed and are negative.  PHYSICAL EXAM  Patient is overweight. He is oriented to person time and place. Affect is normal. There is no jugulovenous distention. Lungs reveal no rales. There is no respiratory distress. Cardiac exam reveals an S1 and S2. Abdomen is soft. There is no peripheral edema. There no musculoskeletal deformities. There are no skin rashes.  EKG is done today and reviewed by me. There is no significant change. There is normal sinus rhythm. There is decreased R wave in V2 which is old.  ASSESSMENT & PLAN

## 2014-01-04 NOTE — Assessment & Plan Note (Signed)
Blood pressure is controlled. No change in therapy. 

## 2014-01-04 NOTE — Assessment & Plan Note (Signed)
The patient has significant lung disease with diffusion capacity abnormalities. His echo shows good left ventricular function and good right ventricular function. There is no marked pulmonary hypertension. At this point there is no evidence that significant cardiac dysfunction is playing a role with his oxygenation problems. No further workup. He will follow carefully with Dr. Annamaria Boots.

## 2014-01-04 NOTE — Assessment & Plan Note (Addendum)
The patient does have leg cramps at rest. He is not having any exertional leg discomfort. No further workup is planned at this time.  As part of today's evaluation I spent greater than 25 minutes with is total care. More than half of this time was with direct discussion with him reviewing the multiple clinical issues I have outlined.

## 2014-01-04 NOTE — Patient Instructions (Signed)
**Note De-identified Jonathan [name] Obfuscation** Your physician recommends that you continue on your current medications as directed. Please refer to the Current Medication list given to you today.  Your physician wants you to follow-up in: 1 year. You will receive a reminder letter in the mail two months in advance. If you don't receive a letter, please call our office to schedule the follow-up appointment.  

## 2014-01-08 ENCOUNTER — Encounter: Payer: Self-pay | Admitting: Podiatry

## 2014-01-08 ENCOUNTER — Ambulatory Visit (INDEPENDENT_AMBULATORY_CARE_PROVIDER_SITE_OTHER): Payer: Medicare Other | Admitting: Podiatry

## 2014-01-08 VITALS — BP 144/91 | HR 71 | Ht 74.0 in | Wt 257.0 lb

## 2014-01-08 DIAGNOSIS — E109 Type 1 diabetes mellitus without complications: Secondary | ICD-10-CM

## 2014-01-08 DIAGNOSIS — M79609 Pain in unspecified limb: Secondary | ICD-10-CM | POA: Diagnosis not present

## 2014-01-08 DIAGNOSIS — B351 Tinea unguium: Secondary | ICD-10-CM | POA: Diagnosis not present

## 2014-01-08 DIAGNOSIS — M79606 Pain in leg, unspecified: Secondary | ICD-10-CM | POA: Insufficient documentation

## 2014-01-08 NOTE — Progress Notes (Signed)
Subjective:  78 year old Diabetic male presents complaining of painful corns and problematic toe nails and requested for trimming.   Objective:  Thick dystrophic nails x 10.  Contracted lesser digits bilateral with painful corn on 5th digit on right foot  Cavus type foot.  All pedal pulses palpable. No skin lesion or acute problems noted.   Assessment: Onychomycosis x 10.  Hammer toe deformities 2-5 bilateral.  Painful digital corns 5th right.   Treatment:  Debrided 5th digital corn and all nails x 10.  Return in 3 months or as needed.

## 2014-01-08 NOTE — Patient Instructions (Signed)
Seen for hypertrophic nails. All nails debrided. Return in 3 months or as needed.  

## 2014-01-12 ENCOUNTER — Encounter: Payer: Medicare Other | Admitting: Internal Medicine

## 2014-01-15 ENCOUNTER — Telehealth: Payer: Self-pay

## 2014-01-15 NOTE — Telephone Encounter (Addendum)
Spoke with spouse who states that the patient is out of town but he will be at the appt.    Fu vaccine--05/2013 Tdap--2006 PNA--2011 CCS--2011 Colonoscopy-- h/o polyps in colon, s/p several Cscopes per pt , last cscope 2011 w/ Dr Collene Mares (per patient, reportedly normal, no documentation) PSA--11/2011--0.59 Eye exam--10/2012--Dr Groat--no retinopathy

## 2014-01-18 ENCOUNTER — Encounter: Payer: Self-pay | Admitting: Internal Medicine

## 2014-01-18 ENCOUNTER — Ambulatory Visit (INDEPENDENT_AMBULATORY_CARE_PROVIDER_SITE_OTHER): Payer: Medicare Other | Admitting: Internal Medicine

## 2014-01-18 VITALS — BP 135/84 | HR 69 | Temp 97.9°F | Ht 74.4 in | Wt 262.0 lb

## 2014-01-18 DIAGNOSIS — J4489 Other specified chronic obstructive pulmonary disease: Secondary | ICD-10-CM

## 2014-01-18 DIAGNOSIS — E109 Type 1 diabetes mellitus without complications: Secondary | ICD-10-CM

## 2014-01-18 DIAGNOSIS — I2510 Atherosclerotic heart disease of native coronary artery without angina pectoris: Secondary | ICD-10-CM

## 2014-01-18 DIAGNOSIS — N508 Other specified disorders of male genital organs: Secondary | ICD-10-CM

## 2014-01-18 DIAGNOSIS — I10 Essential (primary) hypertension: Secondary | ICD-10-CM

## 2014-01-18 DIAGNOSIS — N5089 Other specified disorders of the male genital organs: Secondary | ICD-10-CM

## 2014-01-18 DIAGNOSIS — E291 Testicular hypofunction: Secondary | ICD-10-CM | POA: Diagnosis not present

## 2014-01-18 DIAGNOSIS — J449 Chronic obstructive pulmonary disease, unspecified: Secondary | ICD-10-CM

## 2014-01-18 DIAGNOSIS — Z125 Encounter for screening for malignant neoplasm of prostate: Secondary | ICD-10-CM

## 2014-01-18 DIAGNOSIS — E785 Hyperlipidemia, unspecified: Secondary | ICD-10-CM | POA: Diagnosis not present

## 2014-01-18 DIAGNOSIS — Z23 Encounter for immunization: Secondary | ICD-10-CM

## 2014-01-18 DIAGNOSIS — Z0000 Encounter for general adult medical examination without abnormal findings: Secondary | ICD-10-CM | POA: Diagnosis not present

## 2014-01-18 MED ORDER — TESTOSTERONE 20.25 MG/ACT (1.62%) TD GEL: 4.0000 "application " | Freq: Every day | TRANSDERMAL | Status: DC

## 2014-01-18 NOTE — Assessment & Plan Note (Signed)
Seems well controlled, check a BMP

## 2014-01-18 NOTE — Progress Notes (Signed)
Pre visit review using our clinic review tool, if applicable. No additional management support is needed unless otherwise documented below in the visit note. 

## 2014-01-18 NOTE — Assessment & Plan Note (Signed)
Had a single episode of low blood sugar, see history of present illness. Recommend not to skip meals, carry sugar with him, otherwise continue with same medications and check an A1c.

## 2014-01-18 NOTE — Addendum Note (Signed)
Addended by: Modena Morrow D on: 01/18/2014 02:39 PM   Modules accepted: Orders

## 2014-01-18 NOTE — Assessment & Plan Note (Signed)
Reports good compliance with medicines, needs a refill. Labs.

## 2014-01-18 NOTE — Progress Notes (Signed)
Subjective:    Patient ID: Jonathan Leather., male    DOB: 02/26/1936, 78 y.o.   MRN: [id number]  DOS:  01/18/2014 Type of  visit:  Here for Medicare AWV:   1. Risk factors based on Past M, S, F history:reviewed   2. Physical Activities: active, does walk in the treadmil or the mall x 2/week 3. Depression/mood: (-) screening 4. Hearing: no problems noted today, denies issues himself   5. ADL's: totally independent  , drives  6. Fall Risk: see instructions , no h/o falls   7. Home Safety: does feelsafe at home   8. Height, weight, &visual acuity: see VS, vision corrected w/ glasses, sees eye doctor regularly  9. Counseling: yes, see below   10. Labs ordered based on risk factors: yes   11. Referral Coordination, if needed   12. Care Plan, see assessment and plan   13. Cognitive Assessment, motor skills, memory and cognition seem appropriate   additionally, we discussed the following issues In general he is feeling great. Low testosterone, on HRT, needs a refill COPD, good compliance with medications,   Despite occ sx (see ROS)  the patient  reports he is doing great. Diabetes, blood sugars around 109, 118. Had a single episode of low blood sugar w/ symptoms and in the setting of skipping his meal Hypertension, good medication compliance, BP today is very good. CAD, recently saw cardiology, note reviewed, he was very stable.   ROS No  CP, SOB No palpitations, no lower extremity edema. No claudication   Denies  nausea, vomiting diarrhea Denies  blood in the stools (+) cough sometimes, dry  (+)wheezing most days (already d/w Dr Annamaria Boots)  (-)hemoptysis  No dysuria, gross hematuria, difficulty urinating      Past Medical History  Diagnosis Date  . CAD (coronary artery disease)     Two RCA stents remotely / 3rd RCA stent 2006  . PVD (peripheral vascular disease)     s/p stents at LE 2009, Dr Einar Gip  . Colon polyps     s/p several Cscopes.  . Diabetes mellitus     dx aprox  2009  . Hyperlipidemia     dx in 90s  . Hypertension     dx in the 90  . ED (erectile dysfunction)     has a vacumm device  . Hypogonadism male   . Shortness of breath     O2 Sat dropped to 82% walking on the treadmill, September, 2012  . COPD (chronic obstructive pulmonary disease)     on O2 Rx  . Ejection fraction     Past Surgical History  Procedure Laterality Date  . Appendectomy    . Tonsillectomy      History   Social History  . Marital Status: Married    Spouse Name: N/A    Number of Children: 6  . Years of Education: N/A   Occupational History  . 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 Smoking status: Former Smoker -- 2.00 packs/day for 30 years    Types: Cigarettes    Quit date: 06/17/1977  . Smokeless tobacco: Never Used     Comment: 2 ppd, quit 1987  . Alcohol Use: Yes     Comment: socially   . Drug Use: No  . Sexual Activity: Not on file   Other Topics Concern  . Not on file   Social History Narrative  Has 2 step sons, and 4 children (lost 1 son)                     Family History  Problem Relation Age of Onset  . Heart disease Father   . Diabetes Other     GM, nephews, many family members  . Hyperlipidemia Other     ?  Marland Kitchen Hypertension Father   . Stroke Father   . Colon cancer Neg Hx   . Prostate cancer Neg Hx       Medication List       This list is accurate as of: 01/18/14  1:19 PM.  Always use your most recent med list.               amLODipine 10 MG tablet  Commonly known as:  NORVASC  TAKE 1 TABLET DAILY     aspirin 81 MG tablet  Take 81 mg by mouth daily.     budesonide-formoterol 160-4.5 MCG/ACT inhaler  Commonly known as:  SYMBICORT  Inhale 2 puffs into the lungs 2 (two) times daily.     clopidogrel 75 MG tablet  Commonly known as:  PLAVIX  TAKE 1 TABLET DAILY     glimepiride 4 MG tablet  Commonly known as:  AMARYL  Take 2 tablets once a day     JANUMET 50-1000 MG per  tablet  Generic drug:  sitaGLIPtin-metformin  TAKE 1 TABLET TWICE A DAY WITH MEALS     losartan 100 MG tablet  Commonly known as:  COZAAR  TAKE 1 TABLET DAILY     metoprolol succinate 100 MG 24 hr tablet  Commonly known as:  TOPROL-XL  TAKE 1 TABLET DAILY     simvastatin 20 MG tablet  Commonly known as:  ZOCOR  TAKE 1 TABLET AT BEDTIME     Testosterone 20.25 MG/ACT (1.62%) Gel  Place 4 application onto the skin daily.     tiotropium 18 MCG inhalation capsule  Commonly known as:  SPIRIVA HANDIHALER  Place 1 capsule (18 mcg total) into inhaler and inhale daily.     triamterene-hydrochlorothiazide 37.5-25 MG per tablet  Commonly known as:  MAXZIDE-25  TAKE 1 TABLET DAILY     VENTOLIN HFA 108 (90 BASE) MCG/ACT inhaler  Generic drug:  albuterol  Inhale 2 puffs into the lungs every 6 (six) hours as needed for wheezing or shortness of breath.           Objective:   Physical Exam BP 135/84  Pulse 69  Temp(Src) 97.9 F (36.6 C)  Ht 6' 2.4" (1.89 m)  Wt 262 lb (118.842 kg)  BMI 33.27 kg/m2  SpO2 94% General -- alert, well-developed, NAD.  Neck --no thyromegaly   HEENT-- Not pale.   Lungs -- normal respiratory effort, no intercostal retractions, no accessory muscle use, and slt decreased breath sounds.  Heart-- normal rate, regular rhythm, no murmur.  Abdomen-- Not distended, good bowel sounds,soft, non-tender. Extremities-- no pretibial edema bilaterally  Neurologic--  alert & oriented X3. Speech normal, gait normal, strength normal in all extremities.  Psych-- Cognition and judgment appear intact. Cooperative with normal attention span and concentration. No anxious or depressed appearing.        Assessment & Plan:

## 2014-01-18 NOTE — Patient Instructions (Signed)
Get your blood work before you leave    Next visit is for routine check up regards your blood sugar , blood pressure    in 4 months  No need to come back fasting Please make an appointment      Hypoglycemia (Low Blood Sugar) Hypoglycemia is when the glucose (sugar) in your blood is too low. Hypoglycemia can happen for many reasons. It can happen to people with or without diabetes. Hypoglycemia can develop quickly and can be a medical emergency.  CAUSES  Having hypoglycemia does not mean that you will develop diabetes. Different causes include:  Missed or delayed meals or not enough carbohydrates eaten.  Medication overdose. This could be by accident or deliberate. If by accident, your medication may need to be adjusted or changed.  Exercise or increased activity without adjustments in carbohydrates or medications.  A nerve disorder that affects body functions like your heart rate, blood pressure and digestion (autonomic neuropathy).  A condition where the stomach muscles do not function properly (gastroparesis). Therefore, medications may not absorb properly.  The inability to recognize the signs of hypoglycemia (hypoglycemic unawareness).  Absorption of insulin  may be altered.  Alcohol consumption.  Pregnancy/menstrual cycles/postpartum. This may be due to hormones.  Certain kinds of tumors. This is very rare. SYMPTOMS   Sweating.  Hunger.  Dizziness.  Blurred vision.  Drowsiness.  Weakness.  Headache.  Rapid heart beat.  Shakiness.  Nervousness. DIAGNOSIS  Diagnosis is made by monitoring blood glucose in one or all of the following ways:  Fingerstick blood glucose monitoring.  Laboratory results. TREATMENT  If you think your blood glucose is low:  Check your blood glucose, if possible. If it is less than 70 mg/dl, take one of the following:  3-4 glucose tablets.   cup juice (prefer clear like apple).   cup "regular" soda pop.  1 cup  milk.  -1 tube of glucose gel.  5-6 hard candies.  Do not over treat because your blood glucose (sugar) will only go too high.  Wait 15 minutes and recheck your blood glucose. If it is still less than 70 mg/dl (or below your target range), repeat treatment.  Eat a snack if it is more than one hour until your next meal. Sometimes, your blood glucose may go so low that you are unable to treat yourself. You may need someone to help you. You may even pass out or be unable to swallow. This may require you to get an injection of glucagon, which raises the blood glucose. HOME CARE INSTRUCTIONS  Check blood glucose as recommended by your caregiver.  Take medication as prescribed by your caregiver.  Follow your meal plan. Do not skip meals. Eat on time.  If you are going to drink alcohol, drink it only with meals.  Check your blood glucose before driving.  Check your blood glucose before and after exercise. If you exercise longer or different than usual, be sure to check blood glucose more frequently.  Always carry treatment with you. Glucose tablets are the easiest to carry.  Always wear medical alert jewelry or carry some form of identification that states that you have diabetes. This will alert people that you have diabetes. If you have hypoglycemia, they will have a better idea on what to do. SEEK MEDICAL CARE IF:   You are having problems keeping your blood sugar at target range.  You are having frequent episodes of hypoglycemia.  You feel you might be having side effects from  your medicines.  You have symptoms of an illness that is not improving after 3-4 days.  You notice a change in vision or a new problem with your vision. SEEK IMMEDIATE MEDICAL CARE IF:   You are a family member or friend of a person whose blood glucose goes below 70 mg/dl and is accompanied by:  Confusion.  A change in mental status.  The inability to swallow.  Passing out. Document Released:  09/03/2005 Document Revised: 11/26/2011 Document Reviewed: 12/31/2011 [facility] Patient Information 2014 [place], [place].    Fall Prevention and Home Safety Falls cause injuries and can affect all age groups. It is possible to use preventive measures to significantly decrease the likelihood of falls. There are many simple measures which can make your home safer and prevent falls. OUTDOORS  Repair cracks and edges of walkways and driveways.  Remove high doorway thresholds.  Trim shrubbery on the main path into your home.  Have good outside lighting.  Clear walkways of tools, rocks, debris, and clutter.  Check that handrails are not broken and are securely fastened. Both sides of steps should have handrails.  Have leaves, snow, and ice cleared regularly.  Use sand or salt on walkways during winter months.  In the garage, clean up grease or oil spills. BATHROOM  Install night lights.  Install grab bars by the toilet and in the tub and shower.  Use non-skid mats or decals in the tub or shower.  Place a plastic non-slip stool in the shower to sit on, if needed.  Keep floors dry and clean up all water on the floor immediately.  Remove soap buildup in the tub or shower on a regular basis.  Secure bath mats with non-slip, double-sided rug tape.  Remove throw rugs and tripping hazards from the floors. BEDROOMS  Install night lights.  Make sure a bedside light is easy to reach.  Do not use oversized bedding.  Keep a telephone by your bedside.  Have a firm chair with side arms to use for getting dressed.  Remove throw rugs and tripping hazards from the floor. KITCHEN  Keep handles on pots and pans turned toward the center of the stove. Use back burners when possible.  Clean up spills quickly and allow time for drying.  Avoid walking on wet floors.  Avoid hot utensils and knives.  Position shelves so they are not too high or low.  Place commonly used objects  within easy reach.  If necessary, use a sturdy step stool with a grab bar when reaching.  Keep electrical cables out of the way.  Do not use floor polish or wax that makes floors slippery. If you must use wax, use non-skid floor wax.  Remove throw rugs and tripping hazards from the floor. STAIRWAYS  Never leave objects on stairs.  Place handrails on both sides of stairways and use them. Fix any loose handrails. Make sure handrails on both sides of the stairways are as long as the stairs.  Check carpeting to make sure it is firmly attached along stairs. Make repairs to worn or loose carpet promptly.  Avoid placing throw rugs at the top or bottom of stairways, or properly secure the rug with carpet tape to prevent slippage. Get rid of throw rugs, if possible.  Have an electrician put in a light switch at the top and bottom of the stairs. OTHER FALL PREVENTION TIPS  Wear low-heel or rubber-soled shoes that are supportive and fit well. Wear closed toe shoes.  When  using a stepladder, make sure it is fully opened and both spreaders are firmly locked. Do not climb a closed stepladder.  Add color or contrast paint or tape to grab bars and handrails in your home. Place contrasting color strips on first and last steps.  Learn and use mobility aids as needed. Install an electrical emergency response system.  Turn on lights to avoid dark areas. Replace light bulbs that burn out immediately. Get light switches that glow.  Arrange furniture to create clear pathways. Keep furniture in the same place.  Firmly attach carpet with non-skid or double-sided tape.  Eliminate uneven floor surfaces.  Select a carpet pattern that does not visually hide the edge of steps.  Be aware of all pets. OTHER HOME SAFETY TIPS  Set the water temperature for 120 F (48.8 C).  Keep emergency numbers on or near the telephone.  Keep smoke detectors on every level of the home and near sleeping  areas. Document Released: 08/24/2002 Document Revised: 03/04/2012 Document Reviewed: 11/23/2011 [facility] Patient Information 2014 [place].

## 2014-01-18 NOTE — Assessment & Plan Note (Addendum)
Td 06 Pneumonia shot --2011 prevnar today zostavax-- states he got it at [facility], no records   Colonoscopy-- h/o polyps in colon, s/p several Cscopes per pt  , last cscope 2011 w/ Dr Collene Mares  (per patient, reportedly normal, no documentation)--- will call  Per my assistant conversation w/ GI office ----->  Next due mar 2nd 2016 . 2011 report faxed to our office.     DRE (-) 12-2012 , PSA today Diet-exercise discussed

## 2014-01-19 ENCOUNTER — Telehealth: Payer: Self-pay | Admitting: Internal Medicine

## 2014-01-19 NOTE — Telephone Encounter (Signed)
Relevant patient education assigned to patient using Emmi. ° °

## 2014-01-20 ENCOUNTER — Other Ambulatory Visit: Payer: Medicare Other

## 2014-01-20 DIAGNOSIS — J449 Chronic obstructive pulmonary disease, unspecified: Secondary | ICD-10-CM | POA: Diagnosis not present

## 2014-01-20 DIAGNOSIS — I10 Essential (primary) hypertension: Secondary | ICD-10-CM | POA: Diagnosis not present

## 2014-01-20 DIAGNOSIS — Z0000 Encounter for general adult medical examination without abnormal findings: Secondary | ICD-10-CM | POA: Diagnosis not present

## 2014-01-20 DIAGNOSIS — N508 Other specified disorders of male genital organs: Secondary | ICD-10-CM | POA: Diagnosis not present

## 2014-01-20 DIAGNOSIS — E785 Hyperlipidemia, unspecified: Secondary | ICD-10-CM | POA: Diagnosis not present

## 2014-01-20 DIAGNOSIS — Z125 Encounter for screening for malignant neoplasm of prostate: Secondary | ICD-10-CM | POA: Diagnosis not present

## 2014-01-20 DIAGNOSIS — E291 Testicular hypofunction: Secondary | ICD-10-CM | POA: Diagnosis not present

## 2014-01-20 DIAGNOSIS — E109 Type 1 diabetes mellitus without complications: Secondary | ICD-10-CM | POA: Diagnosis not present

## 2014-01-20 LAB — BASIC METABOLIC PANEL
BUN: 18 mg/dL (ref 6–23)
CHLORIDE: 103 meq/L (ref 96–112)
CO2: 27 mEq/L (ref 19–32)
CREATININE: 1.6 mg/dL — AB (ref 0.4–1.5)
Calcium: 9.7 mg/dL (ref 8.4–10.5)
GFR: 53.56 mL/min — AB (ref >60.00)
Glucose, Bld: 145 mg/dL — ABNORMAL HIGH (ref 70–99)
Potassium: 4 mEq/L (ref 3.5–5.1)
Sodium: 139 mEq/L (ref 135–145)

## 2014-01-20 LAB — HEMOGLOBIN A1C: HEMOGLOBIN A1C: 7.5 % — AB (ref 4.6–6.5)

## 2014-01-20 LAB — PSA: PSA: 0.6 ng/mL (ref 0.10–4.00)

## 2014-01-21 LAB — TESTOSTERONE, FREE, TOTAL, SHBG
Sex Hormone Binding: 32 nmol/L (ref 13–71)
TESTOSTERONE-% FREE: 2.2 % (ref 1.6–2.9)
Testosterone, Free: 148.2 pg/mL (ref 47.0–244.0)
Testosterone: 661 ng/dL (ref 300–890)

## 2014-01-26 ENCOUNTER — Telehealth: Payer: Self-pay | Admitting: *Deleted

## 2014-01-26 NOTE — Telephone Encounter (Signed)
Patient came by and filled out a medical release form to have most recent lab be faxed to Dr. Jesus Genera, ([place] in [place]). Requested information was faxed to [phone]. Fax confirmation received. JG//CMA

## 2014-01-27 ENCOUNTER — Telehealth: Payer: Self-pay

## 2014-01-27 DIAGNOSIS — E118 Type 2 diabetes mellitus with unspecified complications: Principal | ICD-10-CM

## 2014-01-27 DIAGNOSIS — N189 Chronic kidney disease, unspecified: Secondary | ICD-10-CM

## 2014-01-27 DIAGNOSIS — E1165 Type 2 diabetes mellitus with hyperglycemia: Secondary | ICD-10-CM

## 2014-01-27 DIAGNOSIS — IMO0002 Reserved for concepts with insufficient information to code with codable children: Secondary | ICD-10-CM

## 2014-01-27 NOTE — Telephone Encounter (Signed)
Called patient and advised per recommendations. Patient agrees with plan.  Referrals entered Mailed copy to patient

## 2014-01-27 NOTE — Telephone Encounter (Signed)
Message copied by Reino Bellis on Wed Jan 27, 2014 10:01 AM ------      Message from: Kathlene November E      Created: Fri Jan 22, 2014 10:15 AM       Advise patient      Kidney function gradually decreasing, scheduled a renal ultrasound (dx CRI)      Diabetes needs better control, refer to endocrinology (metformin may need to be discontinued given current level of creatinine)      Testosterone and PSA are normal, for now continue with same medications until he sees endocrinology ------

## 2014-01-27 NOTE — Telephone Encounter (Signed)
Relevant patient education assigned to patient using Emmi. ° °

## 2014-02-01 ENCOUNTER — Other Ambulatory Visit: Payer: Self-pay | Admitting: *Deleted

## 2014-02-01 ENCOUNTER — Ambulatory Visit (INDEPENDENT_AMBULATORY_CARE_PROVIDER_SITE_OTHER): Payer: Medicare Other | Admitting: Endocrinology

## 2014-02-01 ENCOUNTER — Telehealth: Payer: Self-pay | Admitting: *Deleted

## 2014-02-01 ENCOUNTER — Encounter: Payer: Self-pay | Admitting: Endocrinology

## 2014-02-01 VITALS — BP 144/82 | HR 80 | Temp 97.9°F | Resp 16 | Ht 74.0 in | Wt 260.2 lb

## 2014-02-01 DIAGNOSIS — IMO0001 Reserved for inherently not codable concepts without codable children: Secondary | ICD-10-CM | POA: Diagnosis not present

## 2014-02-01 DIAGNOSIS — E785 Hyperlipidemia, unspecified: Secondary | ICD-10-CM

## 2014-02-01 DIAGNOSIS — I2510 Atherosclerotic heart disease of native coronary artery without angina pectoris: Secondary | ICD-10-CM

## 2014-02-01 DIAGNOSIS — E109 Type 1 diabetes mellitus without complications: Secondary | ICD-10-CM | POA: Diagnosis not present

## 2014-02-01 DIAGNOSIS — E1165 Type 2 diabetes mellitus with hyperglycemia: Secondary | ICD-10-CM | POA: Insufficient documentation

## 2014-02-01 DIAGNOSIS — IMO0002 Reserved for concepts with insufficient information to code with codable children: Secondary | ICD-10-CM | POA: Insufficient documentation

## 2014-02-01 LAB — GLUCOSE, POCT (MANUAL RESULT ENTRY): POC Glucose: 151 mg/dl — AB (ref 70–99)

## 2014-02-01 MED ORDER — GLUCOSE BLOOD VI STRP: ORAL_STRIP | Status: DC

## 2014-02-01 MED ORDER — ONETOUCH DELICA LANCETS FINE MISC: Status: DC

## 2014-02-01 MED ORDER — VICTOZA 18 MG/3ML ~~LOC~~ SOPN: 1.2000 mg | PEN_INJECTOR | Freq: Every day | SUBCUTANEOUS | Status: DC

## 2014-02-01 MED ORDER — INSULIN PEN NEEDLE 32G X 5 MM MISC: Status: DC

## 2014-02-01 NOTE — Telephone Encounter (Signed)
[facility] # [id number], please call pt back and let him know the size of the pen and the dosage amout

## 2014-02-01 NOTE — Patient Instructions (Signed)
   Please check blood sugars at least half the time about 2 hours after any meal and 3 times a week on waking up.  Please bring blood sugar monitor to each visit   Start VICTOZA injection with the sample pen once daily at the same time of the day preferably at bedtime.  Dial the dose to 0.6 mg for the first week.   About 20% of patients experience nausea in the first few days which usually gets better After 1 week increase the dose to 1.2mg  daily if no nausea.  You may inject in the stomach, thigh or arm.    You will feel fullness of the stomach with starting the medication and should try to keep portions of food small.     Reduce Janumet and glimepiride dose to one tablet today

## 2014-02-01 NOTE — Progress Notes (Addendum)
Patient ID: Jonathan Leather., male   DOB: Sep 02, 1936, 78 y.o.   MRN: [id number]    Reason for Appointment: Consultation for Type 2 Diabetes  History of Present Illness:          Diagnosis: Type 2 diabetes mellitus, date of diagnosis:  2009      Past history: He is not clear what his diabetes was diagnosed but according to hospital records it may have been in 2009. Most likely he was placed on metformin initially and at some point Amaryl added. In 2013 it was also given Januvia presumably to improve his control. However appears that his A1c has been consistently over 7% Janumet was started instead of metformin and Januvia separately in 2013   Recent history:  He has now been referred here for further management because of inadequate levels of A1c He is currently using a Prodigy monitor and unable to download this  However has been checking his blood sugars only before meals, usually before breakfast and supper His blood sugars are relatively higher in the morning No hypoglycemic symptoms despite taking 8 mg of Amaryl in the morning He has been trying to walk at the mall about 3 days a week He was given instructions by dietitian several years ago but does not have any specific plan Compliance with the medical regimen: Fairly good      Oral hypoglycemic drugs the patient is taking are: Janumet 50/1000 twice a day, Amaryl 8 mg daily      Side effects from medications have been: None  Glucose monitoring:  done one time a day         Glucometer:  Prodigy     Blood Glucose readings from recall   PREMEAL Breakfast Lunch Dinner Bedtime  Overall   Glucose range: upto 160  120-150    Median:        Hypoglycemia:   none     Glycemic control:  Lab Results  Component Value Date   HGBA1C 7.5* 01/20/2014   HGBA1C 7.6* 09/22/2013   HGBA1C 7.3* 05/19/2013   Lab Results  Component Value Date   MICROALBUR 1.5 03/21/2010   LDLCALC 75 09/22/2013   CREATININE 1.6* 01/20/2014    Self-care:      Meals:  3 meals per day.  Usually eating cereal for breakfast, not eating out frequently         Exercise: Walks 3/7 for 1 hour         Dietician visit: Most recent: Unknown.               Retinal exam: Most recent: 3/15 .    Weight history: Wt Readings from Last 3 Encounters:  02/01/14 260 lb 3.2 oz (118.026 kg)  01/18/14 262 lb (118.842 kg)  01/08/14 257 lb (116.574 kg)      Medication List       This list is accurate as of: 02/01/14 11:30 AM.  Always use your most recent med list.               amLODipine 10 MG tablet  Commonly known as:  NORVASC  TAKE 1 TABLET DAILY     aspirin 81 MG tablet  Take 81 mg by mouth daily.     budesonide-formoterol 160-4.5 MCG/ACT inhaler  Commonly known as:  SYMBICORT  Inhale 2 puffs into the lungs 2 (two) times daily.     clopidogrel 75 MG tablet  Commonly known as:  PLAVIX  TAKE 1 TABLET DAILY  glimepiride 4 MG tablet  Commonly known as:  AMARYL  Take 2 tablets once a day     JANUMET 50-1000 MG per tablet  Generic drug:  sitaGLIPtin-metformin  TAKE 1 TABLET TWICE A DAY WITH MEALS     losartan 100 MG tablet  Commonly known as:  COZAAR  TAKE 1 TABLET DAILY     metoprolol succinate 100 MG 24 hr tablet  Commonly known as:  TOPROL-XL  TAKE 1 TABLET DAILY     simvastatin 20 MG tablet  Commonly known as:  ZOCOR  TAKE 1 TABLET AT BEDTIME     Testosterone 20.25 MG/ACT (1.62%) Gel  Place 4 application onto the skin daily.     tiotropium 18 MCG inhalation capsule  Commonly known as:  SPIRIVA HANDIHALER  Place 1 capsule (18 mcg total) into inhaler and inhale daily.     triamterene-hydrochlorothiazide 37.5-25 MG per tablet  Commonly known as:  MAXZIDE-25  TAKE 1 TABLET DAILY     VENTOLIN HFA 108 (90 BASE) MCG/ACT inhaler  Generic drug:  albuterol  Inhale 2 puffs into the lungs every 6 (six) hours as needed for wheezing or shortness of breath.        Allergies: No Known Allergies  Past Medical History  Diagnosis Date  .  CAD (coronary artery disease)     Two RCA stents remotely / 3rd RCA stent 2006  . PVD (peripheral vascular disease)     s/p stents at LE 2009, Dr Einar Gip  . Colon polyps     s/p several Cscopes.  . Diabetes mellitus     dx aprox 2009  . Hyperlipidemia     dx in 90s  . Hypertension     dx in the 90  . ED (erectile dysfunction)     has a vacumm device  . Hypogonadism male   . Shortness of breath     O2 Sat dropped to 82% walking on the treadmill, September, 2012  . COPD (chronic obstructive pulmonary disease)     on O2 Rx  . Ejection fraction     Past Surgical History  Procedure Laterality Date  . Appendectomy    . Tonsillectomy      Family History  Problem Relation Age of Onset  . Heart disease Father   . Diabetes Other     GM, nephews, many family members  . Hyperlipidemia Other     ?  Marland Kitchen Hypertension Father   . Stroke Father   . Colon cancer Neg Hx   . Prostate cancer Neg Hx     Social History:  reports that he quit smoking about 36 years ago. His smoking use included Cigarettes. He has a 60 pack-year smoking history. He has never used smokeless tobacco. He reports that he drinks alcohol. He reports that he does not use illicit drugs.    Review of Systems       Lipids: He has been treated with simvastatin 20 mg       Lab Results  Component Value Date   CHOL 129 09/22/2013   HDL 35.80* 09/22/2013   LDLCALC 75 09/22/2013   TRIG 92.0 09/22/2013   CHOLHDL 4 09/22/2013                  Skin: No rash or infections     Thyroid:  No  unusual fatigue.     The blood pressure has been controlled with multiple drugs including losartan 100 mg      No  swelling of feet.      May get shortness of breath on exertion especially on walking fast.     Bowel habits: Normal.       No urinary flow difficulties      No joint  pains.      Has had erectile dysfunction for quite sometime. Also had decreased libido previously and was found to have hypogonadism. Symptoms are controlled  with AndroGel  Lab Results  Component Value Date   TESTOSTERONE 661 01/20/2014             Has history of Numbness for about one year along with tingling and burning in feet, does not use any medications to control symptoms     LABS:  Office Visit on 02/01/2014  Component Date Value Ref Range Status  . POC Glucose 02/01/2014 151* 70 - 99 mg/dl Final    Physical Examination:  BP 144/82  Pulse 80  Temp(Src) 97.9 F (36.6 C)  Resp 16  Ht 6\' 2"  (1.88 m)  Wt 260 lb 3.2 oz (118.026 kg)  BMI 33.39 kg/m2  SpO2 96%  GENERAL:         Patient has generalized obesity.   HEENT:         Eye exam shows normal external appearance. Fundus exam shows no retinopathy. Oral exam shows normal mucosa .  NECK:         General:  Neck exam shows no lymphadenopathy. Carotids are normal to palpation and no bruit heard.  Thyroid is not enlarged and no nodules felt.   LUNGS:         Chest is symmetrical. Lungs are clear to auscultation.Marland Kitchen   HEART:         Heart sounds:  S1 and S2 are normal. No murmurs or clicks heard., no S3 or S4.   ABDOMEN:   Abdominal obesity present.  Liver and spleen are not palpable. No other mass or tenderness present.  EXTREMITIES:     There is no edema. No skin lesions present.Marland Kitchen  NEUROLOGICAL:   Vibration sense is absent distally in toes. Ankle jerks are absent bilaterally.biceps reflexes 1+           Diabetic foot exam:  as in the foot exam section MUSCULOSKELETAL:       There is no enlargement or deformity of the joints. Spine is normal to inspection.Marland Kitchen   SKIN:       No rash or lesions of concern.        ASSESSMENT:  Diabetes type 2, with obesity  Although his A1c appears to be consistently over 7% his home blood sugars are only modestly increased above normal See history of present illness for details of current management Problems identified today:  Difficulty losing weight. He is trying to be walk regularly but limited by his COPD  Not checking readings after  meals  Dose limitations on his Januvia and metformin because of worsening renal function. Although his GFR is over 50 would consider reducing both the medications  Inadequate diabetes education  Potential for hypoglycemia with 8 mg Amaryl especially with his renal dysfunction  He is a good candidate for Victoza since this would be relatively safe and renal dysfunction as well as provide better blood sugar control compared to Januvia as well as facilitate weight loss  Complications: Diabetic peripheral neuropathy without sensory loss  History of hyperlipidemia and low HDL, currently taking only low-dose simvastatin despite extensive history of atherosclerotic disease.  Hypogonadism, adequately supplemented  PLAN:  Discussed with the patient the nature of GLP-1 drugs, the action on various organ systems, how they benefit blood glucose control, as well as the benefit of weight loss and  increase satiety . Explained possible side effects especially nausea and vomiting; discussed safety information in package insert. Demonstrated the injection technique. Also reviewed the dosage titration of Victoza  starting with 0.6 mg once a day at the same time for the first week and then increasing to 1.2 mg if no symptoms of nausea. Patient brochure on Victoza and co-pay card given Advised him to reduce Janumet to once a day until renal function improved   Also reduce Amaryl to 1 tablet daily of 4 mg Discussed timing of glucose monitoring including doing outside readings about 2 hours after meals Have recommended consultation with dietitian but he wants to wait on this   Start using one touch glucose monitor which was given today and he was shown how to use this  Counseling and coordination of care time over 50% of today's study minute visit  Elayne Snare 02/01/2014, 11:30 AM   Note: This office note was prepared with Dragon voice recognition system technology. Any transcriptional errors that result from  this process are unintentional.

## 2014-02-02 ENCOUNTER — Ambulatory Visit
Admission: RE | Admit: 2014-02-02 | Discharge: 2014-02-02 | Disposition: A | Discharge status: Home / Self Care | Payer: Medicare Other | Source: Ambulatory Visit | Attending: Internal Medicine | Admitting: Internal Medicine

## 2014-02-02 DIAGNOSIS — N189 Chronic kidney disease, unspecified: Secondary | ICD-10-CM

## 2014-02-02 DIAGNOSIS — N281 Cyst of kidney, acquired: Secondary | ICD-10-CM | POA: Diagnosis not present

## 2014-02-04 ENCOUNTER — Other Ambulatory Visit: Payer: Self-pay | Admitting: *Deleted

## 2014-02-04 ENCOUNTER — Encounter: Payer: Self-pay | Admitting: *Deleted

## 2014-02-04 MED ORDER — GLUCOSE BLOOD VI STRP: ORAL_STRIP | Status: DC

## 2014-02-04 NOTE — Progress Notes (Signed)
Letter sent to pt with U/S results

## 2014-02-11 ENCOUNTER — Other Ambulatory Visit: Payer: Self-pay | Admitting: Internal Medicine

## 2014-02-15 ENCOUNTER — Encounter: Payer: Medicare Other | Attending: Endocrinology | Admitting: Nutrition

## 2014-02-15 ENCOUNTER — Ambulatory Visit (INDEPENDENT_AMBULATORY_CARE_PROVIDER_SITE_OTHER): Payer: Medicare Other | Admitting: Endocrinology

## 2014-02-15 DIAGNOSIS — Z713 Dietary counseling and surveillance: Secondary | ICD-10-CM | POA: Diagnosis not present

## 2014-02-15 DIAGNOSIS — I2510 Atherosclerotic heart disease of native coronary artery without angina pectoris: Secondary | ICD-10-CM | POA: Diagnosis not present

## 2014-02-15 DIAGNOSIS — IMO0001 Reserved for inherently not codable concepts without codable children: Secondary | ICD-10-CM

## 2014-02-15 DIAGNOSIS — E1165 Type 2 diabetes mellitus with hyperglycemia: Principal | ICD-10-CM

## 2014-02-15 MED ORDER — DULAGLUTIDE 1.5 MG/0.5ML ~~LOC~~ SOAJ: 1.5000 mg | SUBCUTANEOUS | Status: DC

## 2014-02-15 NOTE — Progress Notes (Signed)
Patient ID: Jonathan [name]., male   DOB: [birth year], 78 y.o.   MRN: [id number]    Reason for Appointment: Bruising at injection sites  History of Present Illness:          Diagnosis: Type 2 diabetes mellitus, date of diagnosis:  2009      Past history: He is not clear what his diabetes was diagnosed but according to hospital records it may have been in 2009. Most likely he was placed on metformin initially and at some point Amaryl added. In 2013 it was also given Januvia presumably to improve his control. However appears that his A1c has been consistently over 7% Janumet was started instead of metformin and Januvia separately in 2013   Recent history:  He was started on Victoza in 5/15 because of blood sugars being higher especially in the morning He has increased the dose to 1.2 mg However he is having excessive bruising on his abdomen even though he is using a 5 mm, 31-gauge pen needle which he brought today He has been following the instructions for the injection technique is given by the nurse educator He did not bring his meter but he thinks his blood sugars are fairly good and he has less tingling in his feet No hypoglycemic symptoms despite taking 4 mg of Amaryl now in the morning     Oral hypoglycemic drugs the patient is taking are: Janumet 50/1000 twice a day, Amaryl #4 mg daily      Side effects from medications have been: None  Glucose monitoring:  done one time a day         Glucometer:  One Touch Blood Glucose readings ?  Hypoglycemia:   none     Glycemic control:  Lab Results  Component Value Date   HGBA1C 7.5* 01/20/2014   HGBA1C 7.6* 09/22/2013   HGBA1C 7.3* 05/19/2013   Lab Results  Component Value Date   MICROALBUR 1.5 03/21/2010   LDLCALC 75 09/22/2013   CREATININE 1.6* 01/20/2014    Self-care:      Meals: 3 meals per day.  Usually eating cereal for breakfast, not eating out frequently         Exercise: Walks 3/7 for 1 hour         Dietician visit: Most recent:  Unknown.               Retinal exam: Most recent: 3/15 .    Weight history: Wt Readings from Last 3 Encounters:  02/01/14 260 lb 3.2 oz (118.026 kg)  01/18/14 262 lb (118.842 kg)  01/08/14 257 lb (116.574 kg)      Medication List       This list is accurate as of: 02/15/14  8:52 AM.  Always use your most recent med list.               amLODipine 10 MG tablet  Commonly known as:  NORVASC  TAKE 1 TABLET DAILY     aspirin 81 MG tablet  Take 81 mg by mouth daily.     budesonide-formoterol 160-4.5 MCG/ACT inhaler  Commonly known as:  SYMBICORT  Inhale 2 puffs into the lungs 2 (two) times daily.     clopidogrel 75 MG tablet  Commonly known as:  PLAVIX  TAKE 1 TABLET DAILY     glimepiride 4 MG tablet  Commonly known as:  AMARYL  Take 2 tablets once a day     glucose blood test strip  Commonly known as:  ONETOUCH VERIO  Use as instructed to check blood sugar once a day dx code 250.01     Insulin Pen Needle 32G X 5 MM Misc  Commonly known as:  NOVOTWIST  Use one pen needle per day with Victoza     JANUMET 50-1000 MG per tablet  Generic drug:  sitaGLIPtin-metformin  TAKE 1 TABLET TWICE A DAY WITH MEALS     losartan 100 MG tablet  Commonly known as:  COZAAR  TAKE 1 TABLET DAILY     metoprolol succinate 100 MG 24 hr tablet  Commonly known as:  TOPROL-XL  TAKE 1 TABLET DAILY     ONETOUCH DELICA LANCETS FINE Misc  Use to obtain a blood specimen once a day dx code 250.01     simvastatin 20 MG tablet  Commonly known as:  ZOCOR  TAKE 1 TABLET AT BEDTIME     Testosterone 20.25 MG/ACT (1.62%) Gel  Place 4 application onto the skin daily.     tiotropium 18 MCG inhalation capsule  Commonly known as:  SPIRIVA HANDIHALER  Place 1 capsule (18 mcg total) into inhaler and inhale daily.     triamterene-hydrochlorothiazide 37.5-25 MG per tablet  Commonly known as:  MAXZIDE-25  TAKE 1 TABLET DAILY     VENTOLIN HFA 108 (90 BASE) MCG/ACT inhaler  Generic drug:  albuterol   Inhale 2 puffs into the lungs every 6 (six) hours as needed for wheezing or shortness of breath.     VICTOZA 18 MG/3ML Sopn  Generic drug:  Liraglutide  Inject 1.2 mg into the skin daily. Inject once daily at the same time        Allergies: No Known Allergies  Past Medical History  Diagnosis Date  . CAD (coronary artery disease)     Two RCA stents remotely / 3rd RCA stent 2006  . PVD (peripheral vascular disease)     s/p stents at LE 2009, Dr Einar Gip  . Colon polyps     s/p several Cscopes.  . Diabetes mellitus     dx aprox 2009  . Hyperlipidemia     dx in 90s  . Hypertension     dx in the 90  . ED (erectile dysfunction)     has a vacumm device  . Hypogonadism male   . Shortness of breath     O2 Sat dropped to 82% walking on the treadmill, September, 2012  . COPD (chronic obstructive pulmonary disease)     on O2 Rx  . Ejection fraction     Past Surgical History  Procedure Laterality Date  . Appendectomy    . Tonsillectomy      Family History  Problem Relation Age of Onset  . Heart disease Father   . Hypertension Father   . Stroke Father   . Diabetes Other     GM, nephews, many family members  . Hyperlipidemia Other     ?  . Colon cancer Neg Hx   . Prostate cancer Neg Hx   . Diabetes Paternal Aunt   . Diabetes Maternal Grandmother     Social History:  reports that he quit smoking about 36 years ago. His smoking use included Cigarettes. He has a 60 pack-year smoking history. He has never used smokeless tobacco. He reports that he drinks alcohol. He reports that he does not use illicit drugs.    Review of Systems       Lipids: He has been treated with simvastatin 20 mg  Lab Results  Component Value Date   CHOL 129 09/22/2013   HDL 35.80* 09/22/2013   LDLCALC 75 09/22/2013   TRIG 92.0 09/22/2013   CHOLHDL 4 09/22/2013                      Has had erectile dysfunction for quite sometime. Also had decreased libido previously and was found to have  hypogonadism. Symptoms are controlled with AndroGel  Lab Results  Component Value Date   TESTOSTERONE 661 01/20/2014             Has history of Numbness for about one year along with tingling and burning in feet which is slightly better recently   LABS:  No visits with results within 1 Week(s) from this visit. Latest known visit with results is:  Office Visit on 02/01/2014  Component Date Value Ref Range Status  . POC Glucose 02/01/2014 151* 70 - 99 mg/dl Final    Physical Examination:  There were no vitals taken for this visit.  Abdominal wall shows significant area of bluish bruising around 6-7 cm across on the left side and also smaller area on the right side    ASSESSMENT:  Diabetes type 2, with obesity  Although his blood sugars and overall response seems to be good with Victoza he is having excessive ecchymoses on his abdomen because of his using Plavix and aspirin He has limited choices for treatment because of his renal dysfunction and creatinine of 1.6  PLAN:  Trial of Trulicity weekly 1.5 mg instead of Victoza He will be shown how to do this by the nurse educator Advised him not to pinch the skin when he is doing very injection  Elayne Snare 02/15/2014, 8:52 AM   Note: This office note was prepared with Dragon voice recognition system technology. Any transcriptional errors that result from this process are unintentional.

## 2014-02-15 NOTE — Patient Instructions (Signed)
Change to weekly Truicity

## 2014-02-15 NOTE — Addendum Note (Signed)
Addended by: Elayne Snare on: 02/15/2014 01:22 PM   Modules accepted: Orders

## 2014-02-16 NOTE — Patient Instructions (Signed)
Inject Trulicity once a week.  Call if questions or problems.

## 2014-02-16 NOTE — Progress Notes (Signed)
This patient came in today to show me his abdominal bruising from his Victoza pen.  He had a 10 cm area on his left abdomen, and 2 smaller areas on his right abdomen.  He is using a Nano needle, and is injecting perpendicular to the skin.  I told him that he will need to show this to Dr. Dwyane Dee.  He was instructed on the use of the Trulicity  Pen.  He was reminded that this was a once weekly injection.  He reported good understanding of this and had no final questions.  He was given a Personal assistant with instructions for pen use.  He had no final questions.

## 2014-02-18 ENCOUNTER — Ambulatory Visit: Payer: Medicare Other | Admitting: Endocrinology

## 2014-02-22 ENCOUNTER — Other Ambulatory Visit: Payer: Self-pay | Admitting: *Deleted

## 2014-02-22 ENCOUNTER — Other Ambulatory Visit (INDEPENDENT_AMBULATORY_CARE_PROVIDER_SITE_OTHER): Payer: Medicare Other

## 2014-02-22 ENCOUNTER — Other Ambulatory Visit: Payer: Medicare Other

## 2014-02-22 DIAGNOSIS — IMO0001 Reserved for inherently not codable concepts without codable children: Secondary | ICD-10-CM

## 2014-02-22 DIAGNOSIS — E1165 Type 2 diabetes mellitus with hyperglycemia: Principal | ICD-10-CM

## 2014-02-22 LAB — COMPREHENSIVE METABOLIC PANEL
ALT: 25 U/L (ref 0–53)
AST: 20 U/L (ref 0–37)
Albumin: 3.9 g/dL (ref 3.5–5.2)
Alkaline Phosphatase: 56 U/L (ref 39–117)
BUN: 17 mg/dL (ref 6–23)
CO2: 29 meq/L (ref 19–32)
CREATININE: 1.5 mg/dL (ref 0.4–1.5)
Calcium: 9.5 mg/dL (ref 8.4–10.5)
Chloride: 104 mEq/L (ref 96–112)
GFR: 59.9 mL/min — AB (ref >60.00)
GLUCOSE: 114 mg/dL — AB (ref 70–99)
Potassium: 3.7 mEq/L (ref 3.5–5.1)
Sodium: 139 mEq/L (ref 135–145)
Total Bilirubin: 0.4 mg/dL (ref 0.2–1.2)
Total Protein: 6.9 g/dL (ref 6.0–8.3)

## 2014-02-22 MED ORDER — DULAGLUTIDE 1.5 MG/0.5ML ~~LOC~~ SOAJ: SUBCUTANEOUS | Status: DC

## 2014-02-23 ENCOUNTER — Encounter: Payer: Self-pay | Admitting: Internal Medicine

## 2014-02-23 LAB — FRUCTOSAMINE: FRUCTOSAMINE: 282 umol/L (ref 0–285)

## 2014-02-26 ENCOUNTER — Encounter: Payer: Self-pay | Admitting: Endocrinology

## 2014-02-26 ENCOUNTER — Ambulatory Visit (INDEPENDENT_AMBULATORY_CARE_PROVIDER_SITE_OTHER): Payer: Medicare Other | Admitting: Endocrinology

## 2014-02-26 VITALS — BP 128/80 | HR 77 | Temp 98.4°F | Resp 16 | Ht 74.0 in | Wt 255.6 lb

## 2014-02-26 DIAGNOSIS — IMO0001 Reserved for inherently not codable concepts without codable children: Secondary | ICD-10-CM

## 2014-02-26 DIAGNOSIS — I2510 Atherosclerotic heart disease of native coronary artery without angina pectoris: Secondary | ICD-10-CM | POA: Diagnosis not present

## 2014-02-26 DIAGNOSIS — N182 Chronic kidney disease, stage 2 (mild): Secondary | ICD-10-CM

## 2014-02-26 DIAGNOSIS — E1165 Type 2 diabetes mellitus with hyperglycemia: Principal | ICD-10-CM

## 2014-02-26 NOTE — Patient Instructions (Addendum)
Janumet 1 twice daily, call before refilling  Glimeperide 1/2 twice daily

## 2014-02-26 NOTE — Progress Notes (Signed)
Patient ID: Jonathan [name]., male   DOB: 04-15-1936, 78 y.o.   MRN: [id number]    Reason for Appointment: Bruising at injection sites  History of Present Illness:          Diagnosis: Type 2 diabetes mellitus, date of diagnosis:  2009      Past history: He is not clear what his diabetes was diagnosed but according to hospital records it may have been in 2009. Most likely he was placed on metformin initially and at some point Amaryl added. In 2013 it was also given Januvia presumably to improve his control. However appears that his A1c has been consistently over 7% Janumet was started instead of metformin and Januvia separately in 2013   Recent history:  He was started on Trulicity in 1/60 instead of Victoza because excessive bruising on his abdomen since he has been on Plavix and aspirin He was otherwise tolerating the Victoza 1.2 mg and he appeared to have improvement in blood sugars Previously was having relatively high blood sugars especially in the morning He has not had any bruising with switching to Trulicity and has done 2 injections Also taking Janumet once a day and Amaryl 4 mg in the morning His blood sugars are overall fairly good with only sporadic high readings at night and morning readings ranging from about 130-160 recently Also his weight is coming down     Oral hypoglycemic drugs the patient is taking are: Janumet 50/1000 twice a day, Amaryl #4 mg daily      Side effects from medications have been: None  Glucose monitoring:  done one time a day         Glucometer:  One Touch Blood Glucose readings for the last 4 weeks:  PREMEAL Breakfast Lunch Dinner Bedtime Overall  Glucose range:  114-160   122   92-147   122-203    Median:  139     148   135    POST-MEAL PC Breakfast PC Lunch PC Dinner  Glucose range:  122-197    93-153   Mean/median:    135    Hypoglycemia:   none     Glycemic control:  Lab Results  Component Value Date   HGBA1C 7.5* 01/20/2014   HGBA1C  7.6* 09/22/2013   HGBA1C 7.3* 05/19/2013   Lab Results  Component Value Date   MICROALBUR 1.5 03/21/2010   LDLCALC 75 09/22/2013   CREATININE 1.5 02/22/2014    Self-care:      Meals: 3 meals per day.  Usually eating cereal for breakfast, not eating out frequently         Exercise: Walks 3/7 for 1 hour         Dietician visit: Most recent: Unknown.               Retinal exam: Most recent: 3/15 .    Weight history: Wt Readings from Last 3 Encounters:  02/26/14 255 lb 9.6 oz (115.939 kg)  02/01/14 260 lb 3.2 oz (118.026 kg)  01/18/14 262 lb (118.842 kg)      Medication List       This list is accurate as of: 02/26/14  9:06 AM.  Always use your most recent med list.               amLODipine 10 MG tablet  Commonly known as:  NORVASC  TAKE 1 TABLET DAILY     aspirin 81 MG tablet  Take 81 mg by mouth daily.  budesonide-formoterol 160-4.5 MCG/ACT inhaler  Commonly known as:  SYMBICORT  Inhale 2 puffs into the lungs 2 (two) times daily.     clopidogrel 75 MG tablet  Commonly known as:  PLAVIX  TAKE 1 TABLET DAILY     Dulaglutide 1.5 MG/0.5ML Sopn  Commonly known as:  TRULICITY  Inject 1 pen once a week     glimepiride 4 MG tablet  Commonly known as:  AMARYL  Take 2 tablets once a day     glucose blood test strip  Commonly known as:  ONETOUCH VERIO  Use as instructed to check blood sugar once a day dx code 250.01     Insulin Pen Needle 32G X 5 MM Misc  Commonly known as:  NOVOTWIST  Use one pen needle per day with Victoza     JANUMET 50-1000 MG per tablet  Generic drug:  sitaGLIPtin-metformin  TAKE 1 TABLET TWICE A DAY WITH MEALS     losartan 100 MG tablet  Commonly known as:  COZAAR  TAKE 1 TABLET DAILY     metoprolol succinate 100 MG 24 hr tablet  Commonly known as:  TOPROL-XL  TAKE 1 TABLET DAILY     ONETOUCH DELICA LANCETS FINE Misc  Use to obtain a blood specimen once a day dx code 250.01     simvastatin 20 MG tablet  Commonly known as:  ZOCOR   TAKE 1 TABLET AT BEDTIME     Testosterone 20.25 MG/ACT (1.62%) Gel  Place 4 application onto the skin daily.     tiotropium 18 MCG inhalation capsule  Commonly known as:  SPIRIVA HANDIHALER  Place 1 capsule (18 mcg total) into inhaler and inhale daily.     triamterene-hydrochlorothiazide 37.5-25 MG per tablet  Commonly known as:  MAXZIDE-25  TAKE 1 TABLET DAILY     VENTOLIN HFA 108 (90 BASE) MCG/ACT inhaler  Generic drug:  albuterol  Inhale 2 puffs into the lungs every 6 (six) hours as needed for wheezing or shortness of breath.     VICTOZA 18 MG/3ML Sopn  Generic drug:  Liraglutide  Inject 1.2 mg into the skin daily. Inject once daily at the same time        Allergies: No Known Allergies  Past Medical History  Diagnosis Date  . CAD (coronary artery disease)     Two RCA stents remotely / 3rd RCA stent 2006  . PVD (peripheral vascular disease)     s/p stents at LE 2009, Dr Einar Gip  . Colon polyps     s/p several Cscopes.  . Diabetes mellitus     dx aprox 2009  . Hyperlipidemia     dx in 90s  . Hypertension     dx in the 90  . ED (erectile dysfunction)     has a vacumm device  . Hypogonadism male   . Shortness of breath     O2 Sat dropped to 82% walking on the treadmill, September, 2012  . COPD (chronic obstructive pulmonary disease)     on O2 Rx  . Ejection fraction     Past Surgical History  Procedure Laterality Date  . Appendectomy    . Tonsillectomy      Family History  Problem Relation Age of Onset  . Heart disease Father   . Hypertension Father   . Stroke Father   . Diabetes Other     GM, nephews, many family members  . Hyperlipidemia Other     ?  . Colon cancer  Neg Hx   . Prostate cancer Neg Hx   . Diabetes Paternal Aunt   . Diabetes Maternal Grandmother     Social History:  reports that he quit smoking about 36 years ago. His smoking use included Cigarettes. He has a 60 pack-year smoking history. He has never used smokeless tobacco. He  reports that he drinks alcohol. He reports that he does not use illicit drugs.    Review of Systems       Lipids: He has been treated with simvastatin 20 mg       Lab Results  Component Value Date   CHOL 129 09/22/2013   HDL 35.80* 09/22/2013   LDLCALC 75 09/22/2013   TRIG 92.0 09/22/2013   CHOLHDL 4 09/22/2013                      Has had erectile dysfunction for quite sometime. Also had decreased libido previously and was found to have hypogonadism. Symptoms are controlled with AndroGel  Lab Results  Component Value Date   TESTOSTERONE 661 01/20/2014             Has history of Numbness for about one year along with tingling and burning in feet which is slightly better recently   LABS:  Abstract on 02/23/2014  Component Date Value Ref Range Status  . HM Colonoscopy 11/16/2009 bx tubular adenoma    Final  Appointment on 02/22/2014  Component Date Value Ref Range Status  . Sodium 02/22/2014 139  135 - 145 mEq/L Final  . Potassium 02/22/2014 3.7  3.5 - 5.1 mEq/L Final  . Chloride 02/22/2014 104  96 - 112 mEq/L Final  . CO2 02/22/2014 29  19 - 32 mEq/L Final  . Glucose, Bld 02/22/2014 114* 70 - 99 mg/dL Final  . BUN 02/22/2014 17  6 - 23 mg/dL Final  . Creatinine, Ser 02/22/2014 1.5  0.4 - 1.5 mg/dL Final  . Total Bilirubin 02/22/2014 0.4  0.2 - 1.2 mg/dL Final  . Alkaline Phosphatase 02/22/2014 56  39 - 117 U/L Final  . AST 02/22/2014 20  0 - 37 U/L Final  . ALT 02/22/2014 25  0 - 53 U/L Final  . Total Protein 02/22/2014 6.9  6.0 - 8.3 g/dL Final  . Albumin 02/22/2014 3.9  3.5 - 5.2 g/dL Final  . Calcium 02/22/2014 9.5  8.4 - 10.5 mg/dL Final  . GFR 02/22/2014 59.90* >60.00 mL/min Final  . Fructosamine 02/22/2014 282  0 - 285 umol/L Final   Comment: Published reference interval for apparently healthy subjects                          between age 28 and 44 is 38 - 285 umol/L and in a poorly                          controlled diabetic population is 228 - 563 umol/L with a                           mean of 396 umol/L.    Physical Examination:  BP 128/80  Pulse 77  Temp(Src) 98.4 F (36.9 C)  Resp 16  Ht 6\' 2"  (1.88 m)  Wt 255 lb 9.6 oz (115.939 kg)  BMI 32.80 kg/m2  SpO2 92%  Abdominal wall shows no bruising    ASSESSMENT/PLAN:   Diabetes type  2, with obesity  Recently his blood sugars and overall response seems to be good with a GLP-1 drugs and now is on Trulicity He is tolerating the full dose very well and blood sugars are improving along with his weight Fructosamine is upper normal now Since he should continue to have improved blood sugars will continue the same regimen However since glucose readings are relatively higher in the morning and lower at suppertime he will switch Amaryl to half tablet twice a day Also he will need to switch Janumet to extended release metformin 1500 mg a day when he finishes his supply of Janumet Meanwhile he will go back to taking Janumet twice a day   Patient Instructions  Janumet 1 twice daily, call before refilling  Glimeperide 1/2 twice daily    Kyra Laffey 02/26/2014, 9:06 AM   Note: This office note was prepared with Estate agent. Any transcriptional errors that result from this process are unintentional.

## 2014-02-28 DIAGNOSIS — N182 Chronic kidney disease, stage 2 (mild): Secondary | ICD-10-CM | POA: Insufficient documentation

## 2014-03-01 ENCOUNTER — Other Ambulatory Visit: Payer: Self-pay | Admitting: Internal Medicine

## 2014-03-03 ENCOUNTER — Ambulatory Visit: Payer: Medicare Other | Admitting: Nutrition

## 2014-03-03 ENCOUNTER — Telehealth: Payer: Self-pay | Admitting: Endocrinology

## 2014-03-03 NOTE — Telephone Encounter (Signed)
Pt was wondering if he can take a shake call Glucerna ( it is suppose to help with his hunger) Please advise.

## 2014-03-03 NOTE — Telephone Encounter (Signed)
Please see below and advise.

## 2014-03-05 ENCOUNTER — Other Ambulatory Visit: Payer: Self-pay | Admitting: Internal Medicine

## 2014-03-05 NOTE — Telephone Encounter (Signed)
Noted, patient is aware. 

## 2014-03-05 NOTE — Telephone Encounter (Signed)
This will not reduce his appetite. He can try it instead of a meal anyway

## 2014-03-11 ENCOUNTER — Other Ambulatory Visit: Payer: Self-pay | Admitting: *Deleted

## 2014-03-11 ENCOUNTER — Telehealth: Payer: Self-pay | Admitting: Endocrinology

## 2014-03-11 MED ORDER — DULAGLUTIDE 1.5 MG/0.5ML ~~LOC~~ SOAJ: SUBCUTANEOUS | Status: DC

## 2014-03-11 NOTE — Telephone Encounter (Signed)
Jonathan [name] call from [facility] requesting new Rx for Trulicity to be send in for pt. Please give her a call if this is ok

## 2014-03-23 ENCOUNTER — Other Ambulatory Visit: Payer: Self-pay | Admitting: Internal Medicine

## 2014-04-02 ENCOUNTER — Other Ambulatory Visit: Payer: Medicare Other

## 2014-04-02 ENCOUNTER — Other Ambulatory Visit: Payer: Self-pay

## 2014-04-02 ENCOUNTER — Other Ambulatory Visit (INDEPENDENT_AMBULATORY_CARE_PROVIDER_SITE_OTHER): Payer: Medicare Other

## 2014-04-02 DIAGNOSIS — IMO0001 Reserved for inherently not codable concepts without codable children: Secondary | ICD-10-CM

## 2014-04-02 DIAGNOSIS — E1165 Type 2 diabetes mellitus with hyperglycemia: Principal | ICD-10-CM

## 2014-04-02 LAB — COMPREHENSIVE METABOLIC PANEL
ALBUMIN: 4.2 g/dL (ref 3.5–5.2)
ALK PHOS: 55 U/L (ref 39–117)
ALT: 32 U/L (ref 0–53)
AST: 23 U/L (ref 0–37)
BUN: 14 mg/dL (ref 6–23)
CHLORIDE: 103 meq/L (ref 96–112)
CO2: 26 mEq/L (ref 19–32)
Calcium: 9.3 mg/dL (ref 8.4–10.5)
Creatinine, Ser: 1.4 mg/dL (ref 0.4–1.5)
GFR: 65.5 mL/min (ref >60.00)
Glucose, Bld: 97 mg/dL (ref 70–99)
Potassium: 3.5 mEq/L (ref 3.5–5.1)
SODIUM: 141 meq/L (ref 135–145)
TOTAL PROTEIN: 7.5 g/dL (ref 6.0–8.3)
Total Bilirubin: 1 mg/dL (ref 0.2–1.2)

## 2014-04-02 LAB — HEMOGLOBIN A1C: Hgb A1c MFr Bld: 7.4 % — ABNORMAL HIGH (ref 4.6–6.5)

## 2014-04-07 ENCOUNTER — Encounter: Payer: Self-pay | Admitting: Endocrinology

## 2014-04-07 ENCOUNTER — Other Ambulatory Visit: Payer: Self-pay | Admitting: *Deleted

## 2014-04-07 ENCOUNTER — Ambulatory Visit (INDEPENDENT_AMBULATORY_CARE_PROVIDER_SITE_OTHER): Payer: Medicare Other | Admitting: Endocrinology

## 2014-04-07 VITALS — BP 135/83 | HR 86 | Temp 98.2°F | Resp 16 | Ht 74.0 in | Wt 253.8 lb

## 2014-04-07 DIAGNOSIS — IMO0001 Reserved for inherently not codable concepts without codable children: Secondary | ICD-10-CM

## 2014-04-07 DIAGNOSIS — I2510 Atherosclerotic heart disease of native coronary artery without angina pectoris: Secondary | ICD-10-CM | POA: Diagnosis not present

## 2014-04-07 DIAGNOSIS — N182 Chronic kidney disease, stage 2 (mild): Secondary | ICD-10-CM

## 2014-04-07 DIAGNOSIS — E1165 Type 2 diabetes mellitus with hyperglycemia: Principal | ICD-10-CM

## 2014-04-07 MED ORDER — GLUCOSE BLOOD VI STRP: ORAL_STRIP | Status: DC

## 2014-04-07 NOTE — Progress Notes (Signed)
Patient ID: Jonathan [name]., male   DOB: 1935/10/03, 78 y.o.   MRN: [id number]    Reason for Appointment: Bruising at injection sites  History of Present Illness:          Diagnosis: Type 2 diabetes mellitus, date of diagnosis:  2009      Past history: He is not clear what his diabetes was diagnosed but according to hospital records it may have been in 2009. Most likely he was placed on metformin initially and at some point Amaryl added. In 2013 it was also given Januvia presumably to improve his control. However appears that his A1c has been consistently over 7% Janumet was started instead of metformin and Januvia separately in 2013   Recent history:  He was started on Trulicity in 7/51 instead of Victoza because excessive bruising on his abdomen since he has been on Plavix and aspirin Previously was having relatively high blood sugars especially in the morning He has not had any bruising with switching to Trulicity and has no nausea Also taking Janumet once a day and Amaryl 2 mg twice a day His blood sugars were higher in the morning and they are slightly better now with starting his Amaryl to twice a day His A1c is surprisingly not better even though his home readings are overall improving He thinks he has been somewhat inconsistent with his exercise and sitting in meetings a lot When he is traveling he is eating out more also Also his weight is coming down     Oral hypoglycemic drugs the patient is taking are: Janumet 50/1000 twice a day, Amaryl #4 mg daily      Side effects from medications have been: None  Glucose monitoring:  done one time a day         Glucometer:  One Touch Blood Glucose readings for the last 4 weeks:  PREMEAL Breakfast Lunch Dinner  PCS  Overall  Glucose range:  94-172    81   94-178    Mean/median:  132     126   130    Hypoglycemia:   none     Glycemic control:  Lab Results  Component Value Date   HGBA1C 7.4* 04/02/2014   HGBA1C 7.5* 01/20/2014    HGBA1C 7.6* 09/22/2013   Lab Results  Component Value Date   MICROALBUR 1.5 03/21/2010   LDLCALC 75 09/22/2013   CREATININE 1.4 04/02/2014    Self-care:      Meals: 3 meals per day.  Usually eating cereal for breakfast   Exercise: Walks 3/7 for 1 hour; less now        Dietician visit: Most recent: Unknown.               Retinal exam: Most recent: 3/15 .    Weight history: Wt Readings from Last 3 Encounters:  04/07/14 253 lb 12.8 oz (115.123 kg)  02/26/14 255 lb 9.6 oz (115.939 kg)  02/01/14 260 lb 3.2 oz (118.026 kg)      Medication List       This list is accurate as of: 04/07/14 12:09 PM.  Always use your most recent med list.               amLODipine 10 MG tablet  Commonly known as:  NORVASC  TAKE 1 TABLET DAILY     aspirin 81 MG tablet  Take 81 mg by mouth daily.     budesonide-formoterol 160-4.5 MCG/ACT inhaler  Commonly known as:  SYMBICORT  Inhale 2 puffs into the lungs 2 (two) times daily.     clopidogrel 75 MG tablet  Commonly known as:  PLAVIX  TAKE 1 TABLET DAILY     Dulaglutide 1.5 MG/0.5ML Sopn  Commonly known as:  TRULICITY  Inject 1 pen once a week     glimepiride 4 MG tablet  Commonly known as:  AMARYL  1/2 twice daily     glucose blood test strip  Commonly known as:  ONETOUCH VERIO  Use as instructed to check blood sugar 2 times a day dx code 250.01     Insulin Pen Needle 32G X 5 MM Misc  Commonly known as:  NOVOTWIST  Use one pen needle per day with Victoza     JANUMET 50-1000 MG per tablet  Generic drug:  sitaGLIPtin-metformin  TAKE 1 TABLET TWICE A DAY WITH MEALS     losartan 100 MG tablet  Commonly known as:  COZAAR  TAKE 1 TABLET DAILY     metoprolol succinate 100 MG 24 hr tablet  Commonly known as:  TOPROL-XL  TAKE 1 TABLET DAILY     ONETOUCH DELICA LANCETS FINE Misc  Use to obtain a blood specimen once a day dx code 250.01     simvastatin 20 MG tablet  Commonly known as:  ZOCOR  TAKE 1 TABLET AT BEDTIME     SPIRIVA  HANDIHALER 18 MCG inhalation capsule  Generic drug:  tiotropium  PLACE 1 CAPSULE INTO INHALER AND INHALE THE CONTENTS OF 1 CAPSULE DAILY     Testosterone 20.25 MG/ACT (1.62%) Gel  Place 4 application onto the skin daily.     triamterene-hydrochlorothiazide 37.5-25 MG per tablet  Commonly known as:  MAXZIDE-25  TAKE 1 TABLET DAILY     VENTOLIN HFA 108 (90 BASE) MCG/ACT inhaler  Generic drug:  albuterol  Inhale 2 puffs into the lungs every 6 (six) hours as needed for wheezing or shortness of breath.        Allergies: No Known Allergies  Past Medical History  Diagnosis Date  . CAD (coronary artery disease)     Two RCA stents remotely / 3rd RCA stent 2006  . PVD (peripheral vascular disease)     s/p stents at LE 2009, Dr Einar Gip  . Colon polyps     s/p several Cscopes.  . Diabetes mellitus     dx aprox 2009  . Hyperlipidemia     dx in 90s  . Hypertension     dx in the 90  . ED (erectile dysfunction)     has a vacumm device  . Hypogonadism male   . Shortness of breath     O2 Sat dropped to 82% walking on the treadmill, September, 2012  . COPD (chronic obstructive pulmonary disease)     on O2 Rx  . Ejection fraction     Past Surgical History  Procedure Laterality Date  . Appendectomy    . Tonsillectomy      Family History  Problem Relation Age of Onset  . Heart disease Father   . Hypertension Father   . Stroke Father   . Diabetes Other     GM, nephews, many family members  . Hyperlipidemia Other     ?  . Colon cancer Neg Hx   . Prostate cancer Neg Hx   . Diabetes Paternal Aunt   . Diabetes Maternal Grandmother     Social History:  reports that he quit smoking about 36 years ago. His  smoking use included Cigarettes. He has a 60 pack-year smoking history. He has never used smokeless tobacco. He reports that he drinks alcohol. He reports that he does not use illicit drugs.    Review of Systems       Lipids: He has been treated with simvastatin 20 mg        Lab Results  Component Value Date   CHOL 129 09/22/2013   HDL 35.80* 09/22/2013   LDLCALC 75 09/22/2013   TRIG 92.0 09/22/2013   CHOLHDL 4 09/22/2013                      Has had erectile dysfunction for quite sometime. Also had decreased libido previously and was found to have hypogonadism. Symptoms are controlled with AndroGel  Lab Results  Component Value Date   TESTOSTERONE 661 01/20/2014             Has history of Numbness for about one year along with tingling and burning in feet which is slightly better recently   LABS:  Appointment on 04/02/2014  Component Date Value Ref Range Status  . Sodium 04/02/2014 141  135 - 145 mEq/L Final  . Potassium 04/02/2014 3.5  3.5 - 5.1 mEq/L Final  . Chloride 04/02/2014 103  96 - 112 mEq/L Final  . CO2 04/02/2014 26  19 - 32 mEq/L Final  . Glucose, Bld 04/02/2014 97  70 - 99 mg/dL Final  . BUN 04/02/2014 14  6 - 23 mg/dL Final  . Creatinine, Ser 04/02/2014 1.4  0.4 - 1.5 mg/dL Final  . Total Bilirubin 04/02/2014 1.0  0.2 - 1.2 mg/dL Final  . Alkaline Phosphatase 04/02/2014 55  39 - 117 U/L Final  . AST 04/02/2014 23  0 - 37 U/L Final  . ALT 04/02/2014 32  0 - 53 U/L Final  . Total Protein 04/02/2014 7.5  6.0 - 8.3 g/dL Final  . Albumin 04/02/2014 4.2  3.5 - 5.2 g/dL Final  . Calcium 04/02/2014 9.3  8.4 - 10.5 mg/dL Final  . GFR 04/02/2014 65.50  >60.00 mL/min Final  . Hemoglobin A1C 04/02/2014 7.4* 4.6 - 6.5 % Final   Glycemic Control Guidelines for People with Diabetes:Non Diabetic:  <6%Goal of Therapy: <7%Additional Action Suggested:  >8%     Physical Examination:  BP 135/83  Pulse 86  Temp(Src) 98.2 F (36.8 C)  Resp 16  Ht 6\' 2"  (1.88 m)  Wt 253 lb 12.8 oz (115.123 kg)  BMI 32.57 kg/m2  SpO2 93%  Not indicated    ASSESSMENT/PLAN:   Diabetes type 2, with obesity  Overall his blood sugars are fairly good and he has lost weight with using Trulicity along with his metformin and Amaryl He does have sporadic higher readings  including some up to 172 in the mornings which is probably from inconsistent diet and exercise regimen However he has lost weight overall Encouraged him to try to be more active and walk regularly even if he is traveling He can reduce his frequency of glucose monitoring  Hypertension: Fairly good control  Renal function is borderline with creatinine 1.4  Patient Instructions  Walk regularly  Check 1x daily at various times    [facility] 04/07/2014, 12:09 PM   Note: This office note was prepared with Estate agent. Any transcriptional errors that result from this process are unintentional.

## 2014-04-07 NOTE — Patient Instructions (Signed)
Walk regularly  Check 1x daily at various times

## 2014-04-08 ENCOUNTER — Other Ambulatory Visit: Payer: Self-pay | Admitting: Internal Medicine

## 2014-04-10 ENCOUNTER — Other Ambulatory Visit: Payer: Self-pay | Admitting: Cardiology

## 2014-04-17 ENCOUNTER — Other Ambulatory Visit: Payer: Self-pay | Admitting: Internal Medicine

## 2014-04-20 ENCOUNTER — Encounter: Payer: Self-pay | Admitting: Podiatry

## 2014-04-20 ENCOUNTER — Ambulatory Visit (INDEPENDENT_AMBULATORY_CARE_PROVIDER_SITE_OTHER): Payer: Medicare Other | Admitting: Podiatry

## 2014-04-20 VITALS — BP 134/86 | HR 79 | Ht 74.0 in | Wt 255.0 lb

## 2014-04-20 DIAGNOSIS — M79604 Pain in right leg: Secondary | ICD-10-CM

## 2014-04-20 DIAGNOSIS — M79609 Pain in unspecified limb: Secondary | ICD-10-CM

## 2014-04-20 DIAGNOSIS — B351 Tinea unguium: Secondary | ICD-10-CM

## 2014-04-20 NOTE — Patient Instructions (Signed)
Seen for hypertrophic nails. All nails debrided. Return in 3 months or as needed.  

## 2014-04-20 NOTE — Progress Notes (Signed)
Subjective:  78 year old Diabetic male presents requesting painful nails trimmed.   Objective:  Thick dystrophic nails x 10.  Contracted lesser digits bilateral with painful corn on 5th digit on right foot  Cavus type foot.  All pedal pulses palpable. No skin lesion or acute problems noted.   Assessment: Onychomycosis x 10.  Hammer toe deformities 2-5 bilateral.  Painful digital corns 5th right.   Treatment:  Debrided 5th digital corn and all nails x 10.  Return in 3 months or as needed.

## 2014-05-17 ENCOUNTER — Other Ambulatory Visit: Payer: Self-pay | Admitting: Internal Medicine

## 2014-05-20 ENCOUNTER — Encounter: Payer: Self-pay | Admitting: Internal Medicine

## 2014-05-20 ENCOUNTER — Ambulatory Visit (INDEPENDENT_AMBULATORY_CARE_PROVIDER_SITE_OTHER): Payer: Medicare Other | Admitting: Internal Medicine

## 2014-05-20 VITALS — BP 134/68 | HR 82 | Wt 255.3 lb

## 2014-05-20 DIAGNOSIS — E1165 Type 2 diabetes mellitus with hyperglycemia: Secondary | ICD-10-CM

## 2014-05-20 DIAGNOSIS — J449 Chronic obstructive pulmonary disease, unspecified: Secondary | ICD-10-CM

## 2014-05-20 DIAGNOSIS — IMO0001 Reserved for inherently not codable concepts without codable children: Secondary | ICD-10-CM | POA: Diagnosis not present

## 2014-05-20 DIAGNOSIS — N182 Chronic kidney disease, stage 2 (mild): Secondary | ICD-10-CM

## 2014-05-20 DIAGNOSIS — I10 Essential (primary) hypertension: Secondary | ICD-10-CM | POA: Diagnosis not present

## 2014-05-20 DIAGNOSIS — E109 Type 1 diabetes mellitus without complications: Secondary | ICD-10-CM

## 2014-05-20 DIAGNOSIS — I2510 Atherosclerotic heart disease of native coronary artery without angina pectoris: Secondary | ICD-10-CM | POA: Diagnosis not present

## 2014-05-20 NOTE — Assessment & Plan Note (Signed)
Under the care of endocrinology, currently on Janumet but not sure if he is supposed to continue it, will e-mail Dr Dwyane Dee

## 2014-05-20 NOTE — Assessment & Plan Note (Signed)
BP well-controlled, no change, recommend to monitor his ambulatory BPs

## 2014-05-20 NOTE — Patient Instructions (Signed)
  Please come back to the office in 6 months  Stop by the front desk and schedule the visit   Reason for the visit: follow up Fasting?  no     Check the  blood pressure 2 or 3 times a   week be sure it is between 110/60 and 140/85. Ideal blood pressure is 120/80. If it is consistently higher or lower, let me know

## 2014-05-20 NOTE — Assessment & Plan Note (Signed)
Last creatinine   stable, renal ultrasound 01-2014 show no obstruction. Plan: Monitor   renal function from time to time

## 2014-05-20 NOTE — Progress Notes (Signed)
Subjective:    Patient ID: Jonathan Leather., male    DOB: Mar 11, 1936, 78 y.o.   MRN: [id number]  DOS:  05/20/2014 Type of visit - description : routine Interval history: In general feeling well. Medication list reviewed, good compliance. Working with Dr. Dwyane Dee to get his diabetes under better control. Endocrinology note reviewed COPD symptoms well controlled with current meds, Good compliance with nocturnal oxygen Ambulatory BPs 140/ 80s.   ROS Denies chest pain, difficulty breathing or extremity edema No nausea, vomiting, diarrhea  Past Medical History  Diagnosis Date  . CAD (coronary artery disease)     Two RCA stents remotely / 3rd RCA stent 2006  . PVD (peripheral vascular disease)     s/p stents at LE 2009, Dr Einar Gip  . Colon polyps     s/p several Cscopes.  . Diabetes mellitus     dx aprox 2009  . Hyperlipidemia     dx in 90s  . Hypertension     dx in the 90  . ED (erectile dysfunction)     has a vacumm device  . Hypogonadism male   . Shortness of breath     O2 Sat dropped to 82% walking on the treadmill, September, 2012  . COPD (chronic obstructive pulmonary disease)     on O2 Rx  . Ejection fraction     Past Surgical History  Procedure Laterality Date  . Appendectomy    . Tonsillectomy      History   Social History  . Marital Status: Married    Spouse Name: N/A    Number of Children: 6  . Years of Education: N/A   Occupational History  . 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 Smoking status: Former Smoker -- 2.00 packs/day for 30 years    Types: Cigarettes    Quit date: 06/17/1977  . Smokeless tobacco: Never Used     Comment: 2 ppd, quit 1987  . Alcohol Use: Yes     Comment: socially   . Drug Use: No  . Sexual Activity: Not on file   Other Topics Concern  . Not on file   Social History Narrative   Has 2 step sons, and 4 children (lost 1 son)                        Medication List         This list is accurate as of: 05/20/14  1:59 PM.  Always use your most recent med list.               amLODipine 10 MG tablet  Commonly known as:  NORVASC  TAKE 1 TABLET DAILY     aspirin 81 MG tablet  Take 81 mg by mouth daily.     clopidogrel 75 MG tablet  Commonly known as:  PLAVIX  TAKE 1 TABLET DAILY     Dulaglutide 1.5 MG/0.5ML Sopn  Commonly known as:  TRULICITY  Inject 1 pen once a week     glimepiride 4 MG tablet  Commonly known as:  AMARYL  TAKE ONE AND ONE-HALF TABLETS DAILY     glucose blood test strip  Commonly known as:  ONETOUCH VERIO  Use as instructed to check blood sugar 2 times a day dx code 250.01     JANUMET 50-1000 MG per tablet  Generic drug:  sitaGLIPtin-metformin  TAKE 1  TABLET TWICE A DAY WITH MEALS     losartan 100 MG tablet  Commonly known as:  COZAAR  TAKE 1 TABLET DAILY     metoprolol succinate 100 MG 24 hr tablet  Commonly known as:  TOPROL-XL  TAKE 1 TABLET DAILY     ONETOUCH DELICA LANCETS FINE Misc  Use to obtain a blood specimen once a day dx code 250.01     simvastatin 20 MG tablet  Commonly known as:  ZOCOR  TAKE 1 TABLET AT BEDTIME     SPIRIVA HANDIHALER 18 MCG inhalation capsule  Generic drug:  tiotropium  PLACE 1 CAPSULE INTO INHALER AND INHALE THE CONTENTS OF 1 CAPSULE DAILY     SYMBICORT 160-4.5 MCG/ACT inhaler  Generic drug:  budesonide-formoterol  INHALE 2 PUFFS INTO THE LUNGS TWICE A DAY     Testosterone 20.25 MG/ACT (1.62%) Gel  Place 4 application onto the skin daily.     triamterene-hydrochlorothiazide 37.5-25 MG per tablet  Commonly known as:  MAXZIDE-25  TAKE 1 TABLET DAILY     VENTOLIN HFA 108 (90 BASE) MCG/ACT inhaler  Generic drug:  albuterol  Inhale 2 puffs into the lungs every 6 (six) hours as needed for wheezing or shortness of breath.           Objective:   Physical Exam BP 134/68  Pulse 82  Wt 255 lb 4.7 oz (115.8 kg)  SpO2 97% General -- alert, well-developed, NAD.  Lungs -- normal  respiratory effort, no intercostal retractions, no accessory muscle use, and normal breath sounds.  Heart-- normal rate, regular rhythm, no murmur.   Extremities-- no pretibial edema bilaterally  Neurologic--  alert & oriented X3. Speech normal, gait appropriate for age, strength symmetric and appropriate for age.  Psych-- Cognition and judgment appear intact. Cooperative with normal attention span and concentration. No anxious or depressed appearing.        Assessment & Plan:   Encouraged to have a flu shot this season

## 2014-05-20 NOTE — Assessment & Plan Note (Signed)
Under the care of endocrinology,

## 2014-05-20 NOTE — Progress Notes (Signed)
Pre visit review using our clinic review tool, if applicable. No additional management support is needed unless otherwise documented below in the visit note. 

## 2014-05-20 NOTE — Assessment & Plan Note (Addendum)
Good compliance of medications and nocturnal oxygen, hemoglobin slightly elevated. Will monitor his hemoglobin from time to time

## 2014-05-21 ENCOUNTER — Other Ambulatory Visit: Payer: Self-pay | Admitting: *Deleted

## 2014-05-21 MED ORDER — METFORMIN HCL 1000 MG PO TABS: 1000.0000 mg | ORAL_TABLET | Freq: Two times a day (BID) | ORAL | Status: DC

## 2014-06-10 ENCOUNTER — Encounter: Payer: Self-pay | Admitting: Internal Medicine

## 2014-06-10 ENCOUNTER — Ambulatory Visit: Payer: Medicare Other | Admitting: Internal Medicine

## 2014-06-10 ENCOUNTER — Ambulatory Visit (INDEPENDENT_AMBULATORY_CARE_PROVIDER_SITE_OTHER)
Admission: RE | Admit: 2014-06-10 | Discharge: 2014-06-10 | Disposition: A | Discharge status: Home / Self Care | Payer: Medicare Other | Source: Ambulatory Visit | Attending: Internal Medicine | Admitting: Internal Medicine

## 2014-06-10 VITALS — BP 120/70 | HR 82 | Ht 74.0 in | Wt 251.4 lb

## 2014-06-10 DIAGNOSIS — J449 Chronic obstructive pulmonary disease, unspecified: Secondary | ICD-10-CM | POA: Diagnosis not present

## 2014-06-10 DIAGNOSIS — J438 Other emphysema: Secondary | ICD-10-CM | POA: Diagnosis not present

## 2014-06-10 DIAGNOSIS — Z23 Encounter for immunization: Secondary | ICD-10-CM

## 2014-06-10 DIAGNOSIS — J439 Emphysema, unspecified: Secondary | ICD-10-CM

## 2014-06-10 NOTE — Patient Instructions (Signed)
Order- CXR  Dx COPD/ emphysema  Flu vax  Please call as needed

## 2014-06-10 NOTE — Progress Notes (Signed)
05/01/12- 68 yoM former smoker referred by Dr. Larose Kells for SOB.  2 PPD until 1987 Patient c/o sob and wheezing. Denies  chest pain, and chest tightness. He says he has not been as aware of shortness of breath as Dr. Larose Kells. In [employer] service a nurse commented on his labored breathing. He does not snore and does not make loud breathing noises but apparently is perceived to have  increased respiratory effort and some wheeze. This is perennial and may be worse supine. His breathing is not waking him during the night and he denies cough, choking or strangling. He is able to walk long distances. He tried Spiriva and Symbicort without effect. Denies any history of pneumonia, asthma or an anemia. History of myocardial infarction with coronary and femoral stents. He does not notice claudication. PFT- 07/10/11-  Normal spirometry flows with small- airway response to bronchodilator, normal lung volumes and moderately reduced diffusion. FEV1/FVC 0.79, DLCO 58% CXR 03/19/12-reviewed with him CHEST - 2 VIEW  Comparison: 06/28/2008  Findings: The heart size and mediastinal contours are within normal  limits. Both lungs are clear. The visualized skeletal structures  are unremarkable.  IMPRESSION:  Negative exam.  Original Report Authenticated By: Angelita Ingles, M.D.  REST Stress Myoview -05/25/11 Impression  Exercise Capacity: Good exercise capacity.  BP Response: Normal blood pressure response.  Clinical Symptoms: Mild chest pain/dyspnea.  ECG Impression: No significant ST segment change suggestive of ischemia.  Comparison with Prior Nuclear Study: No images to compare  Overall Impression: Small inferior wall infarct with mild periinfarct ischemia   06/18/12- 76 yoM former smoker followed for dyspnea/ labored breathing, complicated by CAD/ MI Gets short of breath lying in bed-may wake him. Denies snoring. Off Spiriva. 6MWT -06/18/12- 96%, 88%, 94% 449 M. Significant desaturation with exercise.  09/18/12- 54 yoM  former smoker followed for dyspnea/ labored breathing, complicated by CAD/ MI Follows For : pt states having no issues today. states that wife tells him he breaths thru his mouth  at night. pt is on  2L at night/ APS.  Overnight oximetry on room air 06/23/2012 had demonstrated over 5 hours with saturation less than 80% while asleep. We had called to start home oxygen during sleep. He now wants to travel to [place] and then to [place]. I discussed his oxygen status the reason for prescribing oxygen. I am suggesting he get a portable concentrator for this trip.  06/04/13- 47 yoM former smoker followed for dyspnea/ labored breathing, complicated by CAD/ MI FOLLOWS FOR: has been doing good with breathing since wearing O2 2L/ APS at night. Needs O2 recert for O2 uses at night and with travel (pt states he uses O2 during the day while at home if needed) [facility] frequently and often leaves his oxygen behind. Paces himself. Sleeps better at night with oxygen. Frequent wheeze but no productive cough. Continues Symbicort, Spiriva.  12/08/13- 26 yoM former smoker followed for dyspnea/ labored breathing, complicated by CAD/ MI FOLLOWS FOR: Tries to stay active but continues to have SOB. Would like to discuss O2 options-DMe is APS. Needs something smaller and lightweight. Reviewed PFT, ONOX and 6MWT. Desats with sleep and exertion. Nearly normal PFT except low DLCO. No hx PE or murmur. + hx MI and PAD. He uses standard concentrator for sleep, older portable concentrator as needed during activity and for travel. CXR 06/08/13 IMPRESSION:  No active cardiopulmonary disease.  Electronically Signed  By: Lajean Manes  On: 06/04/2013 10:59   06/10/14- 22 yoM former smoker followed  for dyspnea/ labored breathing, complicated by CAD/ MI FOLLOWS FOR:sob-better,no cough,occass. wheezing,denies cp or tightness,wears oxygen 2L at hs O2 2L/ APS Very little cough or wheeze. Uses Symbicort and Spiriva as directed and never  sees need for rescue inhaler. Cardiac status stable, controlled.   ROS-see HPI Constitutional:   No-   weight loss, night sweats, fevers, chills, fatigue, lassitude. HEENT:   No-  headaches, difficulty swallowing, tooth/dental problems, sore throat,       No-  sneezing, itching, ear ache, nasal congestion, post nasal drip,  CV:  No-   chest pain, orthopnea, PND, swelling in lower extremities, anasarca, dizziness, palpitations Resp:   + "sometimes"shortness of breath with exertion or at rest.              No-   productive cough,  No non-productive cough,  No- coughing up of blood.              No-   change in color of mucus.  + wheezing.   Skin: No-   rash or lesions. GI:  No-   heartburn, indigestion, abdominal pain, nausea, vomiting, GU:  MS:  No-   joint pain or swelling.   Neuro-     nothing unusual Psych:  No- change in mood or affect. No depression or anxiety.  No memory loss.  OBJ- Physical Examrest on room air General- Alert, Oriented, Affect-appropriate, Distress- none acute. Overweight Skin- rash-none, lesions- none, excoriation- none Lymphadenopathy- none Head- atraumatic            Eyes- Gross vision intact, PERRLA, conjunctivae and secretions clear            Ears- Hearing, canals-normal            Nose- Clear, no-Septal dev, mucus, polyps, erosion, perforation             Throat- Mallampati II , mucosa clear , drainage- none, tonsils- atrophic Neck- flexible , trachea midline, no stridor , thyroid nl, carotid no bruit Chest - symmetrical excursion , unlabored           Heart/CV- RRR , no murmur , no gallop  , no rub, nl s1 s2                           - JVD-none , edema- none, stasis changes- none, varices- none           Lung- clear, cough+light , dullness-none, rub- none           Chest wall-  Abd-  Br/ Gen/ Rectal- Not done, not indicated Extrem- cyanosis- none, clubbing?- Question mild spooning of nails; atrophy- none, strength- nl Neuro- grossly intact to  observation

## 2014-07-05 ENCOUNTER — Other Ambulatory Visit (INDEPENDENT_AMBULATORY_CARE_PROVIDER_SITE_OTHER): Payer: Medicare Other

## 2014-07-05 ENCOUNTER — Other Ambulatory Visit: Payer: Self-pay | Admitting: Internal Medicine

## 2014-07-05 ENCOUNTER — Other Ambulatory Visit: Payer: Medicare Other

## 2014-07-05 DIAGNOSIS — IMO0002 Reserved for concepts with insufficient information to code with codable children: Secondary | ICD-10-CM

## 2014-07-05 DIAGNOSIS — E1165 Type 2 diabetes mellitus with hyperglycemia: Secondary | ICD-10-CM

## 2014-07-05 LAB — COMPREHENSIVE METABOLIC PANEL
ALT: 19 U/L (ref 0–53)
AST: 19 U/L (ref 0–37)
Albumin: 3.6 g/dL (ref 3.5–5.2)
Alkaline Phosphatase: 64 U/L (ref 39–117)
BUN: 22 mg/dL (ref 6–23)
CALCIUM: 9.5 mg/dL (ref 8.4–10.5)
CHLORIDE: 104 meq/L (ref 96–112)
CO2: 25 meq/L (ref 19–32)
CREATININE: 1.6 mg/dL — AB (ref 0.4–1.5)
GFR: 55.06 mL/min — AB (ref >60.00)
GLUCOSE: 129 mg/dL — AB (ref 70–99)
Potassium: 3.7 mEq/L (ref 3.5–5.1)
Sodium: 139 mEq/L (ref 135–145)
Total Bilirubin: 0.5 mg/dL (ref 0.2–1.2)
Total Protein: 7.9 g/dL (ref 6.0–8.3)

## 2014-07-05 LAB — HEMOGLOBIN A1C: HEMOGLOBIN A1C: 6.6 % — AB (ref 4.6–6.5)

## 2014-07-08 ENCOUNTER — Encounter: Payer: Self-pay | Admitting: Endocrinology

## 2014-07-08 ENCOUNTER — Ambulatory Visit (INDEPENDENT_AMBULATORY_CARE_PROVIDER_SITE_OTHER): Payer: Medicare Other | Admitting: Endocrinology

## 2014-07-08 ENCOUNTER — Other Ambulatory Visit: Payer: Self-pay | Admitting: *Deleted

## 2014-07-08 VITALS — BP 126/88 | HR 84 | Temp 98.1°F | Resp 16 | Ht 74.0 in | Wt 249.0 lb

## 2014-07-08 DIAGNOSIS — E1165 Type 2 diabetes mellitus with hyperglycemia: Secondary | ICD-10-CM

## 2014-07-08 DIAGNOSIS — N182 Chronic kidney disease, stage 2 (mild): Secondary | ICD-10-CM | POA: Diagnosis not present

## 2014-07-08 DIAGNOSIS — I2510 Atherosclerotic heart disease of native coronary artery without angina pectoris: Secondary | ICD-10-CM | POA: Diagnosis not present

## 2014-07-08 DIAGNOSIS — I10 Essential (primary) hypertension: Secondary | ICD-10-CM | POA: Diagnosis not present

## 2014-07-08 DIAGNOSIS — IMO0002 Reserved for concepts with insufficient information to code with codable children: Secondary | ICD-10-CM

## 2014-07-08 LAB — MICROALBUMIN / CREATININE URINE RATIO
Creatinine,U: 200.3 mg/dL
MICROALB/CREAT RATIO: 0.3 mg/g (ref 0.0–30.0)
Microalb, Ur: 0.7 mg/dL (ref 0.0–1.9)

## 2014-07-08 MED ORDER — METFORMIN HCL 1000 MG PO TABS: 1000.0000 mg | ORAL_TABLET | Freq: Two times a day (BID) | ORAL | Status: DC

## 2014-07-08 NOTE — Patient Instructions (Addendum)
Metformin 1/2 in am and in 1 in pm  Losartan 100mg , take 1/2 daily

## 2014-07-08 NOTE — Progress Notes (Signed)
Patient ID: Jonathan [name]., male   DOB: Sep 16, 1936, 78 y.o.   MRN: [id number]    Reason for Appointment:  F/u  History of Present Illness:          Diagnosis: Type 2 diabetes mellitus, date of diagnosis:  2009      Past history: He is not clear what his diabetes was diagnosed but according to hospital records it may have been in 2009. Most likely he was placed on metformin initially and at some point Amaryl added. In 2013 it was also given Januvia presumably to improve his control. However appears that his A1c has been consistently over 7% Janumet was started instead of metformin and Januvia separately in 2013   Recent history:  He was started on Trulicity in 5/05 instead of Victoza because excessive bruising on his abdomen since he has been on Plavix and aspirin Previously was having relatively high blood sugars especially in the morning He has been tolerating Trulicity and is on 1.5 mg weekly Now taking metformin 1 g twice a day instead of Janumet Also taking Amaryl 4 mg in the mornings although was told to try this twice a day previously His A1c is excellent at 6.6 even though his fasting readings are still averaging about 134 Also his weight is coming down further     Oral hypoglycemic drugs the patient is taking LZJ:QBHALPFXT 1000 twice a day, Amaryl 4 mg daily      Side effects from medications have been: None  Glucose monitoring:  done one time a day         Glucometer:  One Touch Blood Glucose readings for the last 4 weeks:  PREMEAL Breakfast Lunch Dinner Bedtime Overall  Glucose range:  122-141   118-156   126, 133   130-203    Median:  137   121     132    POST-MEAL PC Breakfast PC Lunch PC Dinner  Glucose range:  122-212     Mean/median:  129      Hypoglycemia:   none     Glycemic control:  Lab Results  Component Value Date   HGBA1C 6.6* 07/05/2014   HGBA1C 7.4* 04/02/2014   HGBA1C 7.5* 01/20/2014   Lab Results  Component Value Date   MICROALBUR 1.5 03/21/2010    LDLCALC 75 09/22/2013   CREATININE 1.6* 07/05/2014    Self-care:      Meals: 3 meals per day.  Usually eating cereal for breakfast   Exercise: Walks 3/7 for 1 hour; less now        Dietician visit: Most recent: Unknown.               Retinal exam: Most recent: 3/15 .    Weight history: Wt Readings from Last 3 Encounters:  07/08/14 249 lb (112.946 kg)  06/10/14 251 lb 6.4 oz (114.034 kg)  05/20/14 255 lb 4.7 oz (115.8 kg)      Medication List       This list is accurate as of: 07/08/14  2:36 PM.  Always use your most recent med list.               amLODipine 10 MG tablet  Commonly known as:  NORVASC  TAKE 1 TABLET DAILY     aspirin 81 MG tablet  Take 81 mg by mouth daily.     clopidogrel 75 MG tablet  Commonly known as:  PLAVIX  TAKE 1 TABLET DAILY     Dulaglutide  1.5 MG/0.5ML Sopn  Commonly known as:  TRULICITY  Inject 1 pen once a week     glimepiride 4 MG tablet  Commonly known as:  AMARYL  TAKE ONE AND ONE-HALF TABLETS DAILY     glucose blood test strip  Commonly known as:  ONETOUCH VERIO  Use as instructed to check blood sugar 2 times a day dx code 250.01     losartan 100 MG tablet  Commonly known as:  COZAAR  TAKE 1 TABLET DAILY     metFORMIN 1000 MG tablet  Commonly known as:  GLUCOPHAGE  Take 1 tablet (1,000 mg total) by mouth 2 (two) times daily with a meal.     metoprolol succinate 100 MG 24 hr tablet  Commonly known as:  TOPROL-XL  TAKE 1 TABLET DAILY     ONETOUCH DELICA LANCETS FINE Misc  Use to obtain a blood specimen once a day dx code 250.01     simvastatin 20 MG tablet  Commonly known as:  ZOCOR  TAKE 1 TABLET AT BEDTIME     SPIRIVA HANDIHALER 18 MCG inhalation capsule  Generic drug:  tiotropium  PLACE 1 CAPSULE INTO INHALER AND INHALE THE CONTENTS OF 1 CAPSULE DAILY     SYMBICORT 160-4.5 MCG/ACT inhaler  Generic drug:  budesonide-formoterol  INHALE 2 PUFFS INTO THE LUNGS TWICE A DAY     Testosterone 20.25 MG/ACT (1.62%)  Gel  Place 4 application onto the skin daily.     triamterene-hydrochlorothiazide 37.5-25 MG per tablet  Commonly known as:  MAXZIDE-25  TAKE 1 TABLET DAILY     VENTOLIN HFA 108 (90 BASE) MCG/ACT inhaler  Generic drug:  albuterol  Inhale 2 puffs into the lungs every 6 (six) hours as needed for wheezing or shortness of breath.        Allergies: No Known Allergies  Past Medical History  Diagnosis Date  . CAD (coronary artery disease)     Two RCA stents remotely / 3rd RCA stent 2006  . PVD (peripheral vascular disease)     s/p stents at LE 2009, Dr Einar Gip  . Colon polyps     s/p several Cscopes.  . Diabetes mellitus     dx aprox 2009  . Hyperlipidemia     dx in 90s  . Hypertension     dx in the 90  . ED (erectile dysfunction)     has a vacumm device  . Hypogonadism male   . Shortness of breath     O2 Sat dropped to 82% walking on the treadmill, September, 2012  . COPD (chronic obstructive pulmonary disease)     on O2 Rx  . Ejection fraction     Past Surgical History  Procedure Laterality Date  . Appendectomy    . Tonsillectomy      Family History  Problem Relation Age of Onset  . Heart disease Father   . Hypertension Father   . Stroke Father   . Diabetes Other     GM, nephews, many family members  . Hyperlipidemia Other     ?  . Colon cancer Neg Hx   . Prostate cancer Neg Hx   . Diabetes Paternal Aunt   . Diabetes Maternal Grandmother     Social History:  reports that he quit smoking about 37 years ago. His smoking use included Cigarettes. He has a 60 pack-year smoking history. He has never used smokeless tobacco. He reports that he drinks alcohol. He reports that he does not use  illicit drugs.    Review of Systems       Lipids: He has been treated with simvastatin 20 mg       Lab Results  Component Value Date   CHOL 129 09/22/2013   HDL 35.80* 09/22/2013   LDLCALC 75 09/22/2013   TRIG 92.0 09/22/2013   CHOLHDL 4 09/22/2013                      Has had  erectile dysfunction for quite sometime. Also had decreased libido previously and was found to have hypogonadism. Symptoms are controlled with AndroGel  Lab Results  Component Value Date   TESTOSTERONE 661 01/20/2014             Has history of Numbness for about one year along with tingling and burning in feet which is slightly better recently   LABS:  Appointment on 07/05/2014  Component Date Value Ref Range Status  . Hemoglobin A1C 07/05/2014 6.6* 4.6 - 6.5 % Final   Glycemic Control Guidelines for People with Diabetes:Non Diabetic:  <6%Goal of Therapy: <7%Additional Action Suggested:  >8%   . Sodium 07/05/2014 139  135 - 145 mEq/L Final  . Potassium 07/05/2014 3.7  3.5 - 5.1 mEq/L Final  . Chloride 07/05/2014 104  96 - 112 mEq/L Final  . CO2 07/05/2014 25  19 - 32 mEq/L Final  . Glucose, Bld 07/05/2014 129* 70 - 99 mg/dL Final  . BUN 07/05/2014 22  6 - 23 mg/dL Final  . Creatinine, Ser 07/05/2014 1.6* 0.4 - 1.5 mg/dL Final  . Total Bilirubin 07/05/2014 0.5  0.2 - 1.2 mg/dL Final  . Alkaline Phosphatase 07/05/2014 64  39 - 117 U/L Final  . AST 07/05/2014 19  0 - 37 U/L Final  . ALT 07/05/2014 19  0 - 53 U/L Final  . Total Protein 07/05/2014 7.9  6.0 - 8.3 g/dL Final  . Albumin 07/05/2014 3.6  3.5 - 5.2 g/dL Final  . Calcium 07/05/2014 9.5  8.4 - 10.5 mg/dL Final  . GFR 07/05/2014 55.06* >60.00 mL/min Final    Physical Examination:  BP 126/88  Pulse 84  Temp(Src) 98.1 F (36.7 C)  Resp 16  Ht 6\' 2"  (1.88 m)  Wt 249 lb (112.946 kg)  BMI 31.96 kg/m2  SpO2 95%  Repeat blood pressure 118/80    ASSESSMENT/PLAN:   Diabetes type 2, with obesity  His home blood sugars are fairly good and he has lost weight with using Trulicity along with his metformin and Amaryl A1c has improved significantly and now is 6.6 He does have sporadic higher readings but has not checking enough readings after dinner Again he has lost some more weight For now he will continue the same regimen  but reduce his metformin to 500 mg in the morning since creatinine is slightly higher  Hypertension: Fairly good control, blood pressure is slightly better than before  Renal function is somewhat worse with creatinine 1.6 and etiology is not clear, no history of microalbuminuria He will try reducing his losartan to 50 mg and continue other medications  Patient Instructions  Metformin 1/2 in am and in 1 in pm  Losartan 100mg , take 1/2 daily     Wray Goehring 07/08/2014, 2:36 PM   Note: This office note was prepared with Estate agent. Any transcriptional errors that result from this process are unintentional.

## 2014-07-21 ENCOUNTER — Ambulatory Visit: Payer: Medicare Other | Admitting: Podiatry

## 2014-07-22 ENCOUNTER — Telehealth: Payer: Self-pay | Admitting: Endocrinology

## 2014-07-22 NOTE — Telephone Encounter (Signed)
Metformin 1/2 in am and in 1 in pm as instructed on the last visit, no need to take Janumet

## 2014-07-22 NOTE — Telephone Encounter (Signed)
Please see below and advise.

## 2014-07-22 NOTE — Telephone Encounter (Signed)
Patient has some questions about his medications Janumet and Metformin he dosen't know which one he should be taking. Please advise

## 2014-07-22 NOTE — Telephone Encounter (Signed)
Noted patient is aware 

## 2014-07-23 ENCOUNTER — Other Ambulatory Visit: Payer: Self-pay | Admitting: Internal Medicine

## 2014-07-26 ENCOUNTER — Other Ambulatory Visit: Payer: Self-pay | Admitting: *Deleted

## 2014-07-26 MED ORDER — DULAGLUTIDE 1.5 MG/0.5ML ~~LOC~~ SOAJ: SUBCUTANEOUS | Status: DC

## 2014-07-28 ENCOUNTER — Other Ambulatory Visit: Payer: Self-pay | Admitting: Internal Medicine

## 2014-08-26 ENCOUNTER — Telehealth: Payer: Self-pay

## 2014-08-26 MED ORDER — TESTOSTERONE 20.25 MG/ACT (1.62%) TD GEL: 4.0000 "application " | Freq: Every day | TRANSDERMAL | Status: DC

## 2014-08-26 NOTE — Telephone Encounter (Signed)
Prescription printed and signed by Dr. Larose Kells.

## 2014-08-26 NOTE — Telephone Encounter (Signed)
Faxed to [facility].

## 2014-08-26 NOTE — Telephone Encounter (Signed)
Ok RF 1 year

## 2014-08-26 NOTE — Telephone Encounter (Signed)
Pt is requesting refill on Androgel.  Last OV: 05/20/2014  Last Fill: 01/18/2014 # [id number] 2RF  Please advise.

## 2014-08-30 ENCOUNTER — Other Ambulatory Visit: Payer: Self-pay | Admitting: Internal Medicine

## 2014-09-10 ENCOUNTER — Other Ambulatory Visit: Payer: Self-pay | Admitting: Internal Medicine

## 2014-09-24 ENCOUNTER — Telehealth: Payer: Self-pay | Admitting: *Deleted

## 2014-09-24 NOTE — Telephone Encounter (Signed)
Please advise 

## 2014-09-24 NOTE — Telephone Encounter (Signed)
Caller name: Aayush Relation to pt: self Call back number: [phone] Pharmacy:  Reason for call: Pt came would like to know if it is [facility] for him to sit in the steam room at the gym.   Please advise.

## 2014-09-24 NOTE — Telephone Encounter (Signed)
Spoke with Pt, informed him of Dr. Larose Kells recommendations. Pt verbalized understanding.

## 2014-09-24 NOTE — Telephone Encounter (Signed)
I recommend not, if he decides to do it in needs to be for only 10 minutes and always with somebody else with him.

## 2014-09-27 ENCOUNTER — Other Ambulatory Visit: Payer: Self-pay | Admitting: *Deleted

## 2014-10-06 ENCOUNTER — Telehealth: Payer: Self-pay | Admitting: Endocrinology

## 2014-10-06 DIAGNOSIS — E1165 Type 2 diabetes mellitus with hyperglycemia: Secondary | ICD-10-CM

## 2014-10-06 DIAGNOSIS — IMO0002 Reserved for concepts with insufficient information to code with codable children: Secondary | ICD-10-CM

## 2014-10-06 MED ORDER — GLUCOSE BLOOD VI STRP: ORAL_STRIP | Status: DC

## 2014-10-06 NOTE — Telephone Encounter (Signed)
Pt needs refill on test strips for one touch verio meter

## 2014-10-06 NOTE — Telephone Encounter (Signed)
Rx sent to pharmacy   

## 2014-10-07 ENCOUNTER — Telehealth: Payer: Self-pay | Admitting: Endocrinology

## 2014-10-07 NOTE — Telephone Encounter (Signed)
Pt states PA is needed for his test strips

## 2014-10-11 ENCOUNTER — Other Ambulatory Visit: Payer: Self-pay | Admitting: *Deleted

## 2014-10-11 ENCOUNTER — Telehealth: Payer: Self-pay | Admitting: Endocrinology

## 2014-10-11 MED ORDER — GLUCOSE BLOOD VI STRP: ORAL_STRIP | Status: DC

## 2014-10-11 MED ORDER — FREESTYLE LITE DEVI: Status: DC

## 2014-10-11 NOTE — Telephone Encounter (Signed)
[facility] needs a script for pt to get the freestyle meter too please

## 2014-10-12 NOTE — Telephone Encounter (Signed)
Pt is using a freestyle lite meter. He has Pharmacist, hospital. I believe he is going to need to switch to One Touch. Be advised.

## 2014-10-21 DIAGNOSIS — Z1211 Encounter for screening for malignant neoplasm of colon: Secondary | ICD-10-CM | POA: Diagnosis not present

## 2014-10-21 DIAGNOSIS — K5730 Diverticulosis of large intestine without perforation or abscess without bleeding: Secondary | ICD-10-CM | POA: Diagnosis not present

## 2014-10-21 DIAGNOSIS — E119 Type 2 diabetes mellitus without complications: Secondary | ICD-10-CM | POA: Diagnosis not present

## 2014-10-21 DIAGNOSIS — I10 Essential (primary) hypertension: Secondary | ICD-10-CM | POA: Diagnosis not present

## 2014-10-21 DIAGNOSIS — J449 Chronic obstructive pulmonary disease, unspecified: Secondary | ICD-10-CM | POA: Diagnosis not present

## 2014-10-21 DIAGNOSIS — E11641 Type 2 diabetes mellitus with hypoglycemia with coma: Secondary | ICD-10-CM | POA: Diagnosis not present

## 2014-10-21 DIAGNOSIS — Z86010 Personal history of colonic polyps: Secondary | ICD-10-CM | POA: Diagnosis not present

## 2014-10-21 DIAGNOSIS — E785 Hyperlipidemia, unspecified: Secondary | ICD-10-CM | POA: Diagnosis not present

## 2014-10-23 ENCOUNTER — Other Ambulatory Visit: Payer: Self-pay | Admitting: Internal Medicine

## 2014-11-02 ENCOUNTER — Telehealth: Payer: Self-pay | Admitting: Internal Medicine

## 2014-11-02 NOTE — Telephone Encounter (Signed)
Caller name: Jaja, Switalski Relation to FU:WTKT Call back [phone] Pharmacy:  Reason for call: pt would like to know if dr. Larose Kells can provide him with a note stating that it is ok for him to work out at the gym. Pt is a member of [employer] and they are requesting the documentation that it is ok for him to work out. Please call when available for pick up

## 2014-11-02 NOTE — Telephone Encounter (Signed)
FYI. Please advise.

## 2014-11-02 NOTE — Telephone Encounter (Signed)
He needs to be active but nothing extreme. Please print a letter:  To whom it may concern,  Mr. Godek is a patient of mine, he will benefit from a age  appropriate exercise program such as using the elliptical machine or a treadmill for 30 minutes. A very mild weightlifting program is also appropriate.

## 2014-11-02 NOTE — Telephone Encounter (Signed)
Spoke with Pt, informed Pt that letter has been printed and informed him placed at front desk for pick up.

## 2014-11-04 ENCOUNTER — Other Ambulatory Visit: Payer: Self-pay | Admitting: *Deleted

## 2014-11-04 ENCOUNTER — Telehealth: Payer: Self-pay | Admitting: Endocrinology

## 2014-11-04 MED ORDER — GLUCOSE BLOOD VI STRP: ORAL_STRIP | Status: DC

## 2014-11-04 NOTE — Telephone Encounter (Signed)
Done

## 2014-11-04 NOTE — Telephone Encounter (Signed)
[facility]

## 2014-11-18 ENCOUNTER — Encounter: Payer: Self-pay | Admitting: Internal Medicine

## 2014-11-18 ENCOUNTER — Telehealth: Payer: Self-pay | Admitting: *Deleted

## 2014-11-18 ENCOUNTER — Ambulatory Visit (INDEPENDENT_AMBULATORY_CARE_PROVIDER_SITE_OTHER): Payer: Medicare Other | Admitting: Internal Medicine

## 2014-11-18 VITALS — BP 124/78 | HR 75 | Temp 98.0°F | Ht 74.0 in | Wt 247.1 lb

## 2014-11-18 DIAGNOSIS — E785 Hyperlipidemia, unspecified: Secondary | ICD-10-CM

## 2014-11-18 DIAGNOSIS — E1159 Type 2 diabetes mellitus with other circulatory complications: Secondary | ICD-10-CM

## 2014-11-18 DIAGNOSIS — J449 Chronic obstructive pulmonary disease, unspecified: Secondary | ICD-10-CM

## 2014-11-18 DIAGNOSIS — I10 Essential (primary) hypertension: Secondary | ICD-10-CM | POA: Diagnosis not present

## 2014-11-18 DIAGNOSIS — Z23 Encounter for immunization: Secondary | ICD-10-CM | POA: Diagnosis not present

## 2014-11-18 LAB — CBC WITH DIFFERENTIAL/PLATELET
Basophils Absolute: 0 10*3/uL (ref 0.0–0.1)
Basophils Relative: 0.5 % (ref 0.0–3.0)
Eosinophils Absolute: 0.3 10*3/uL (ref 0.0–0.7)
Eosinophils Relative: 2.9 % (ref 0.0–5.0)
Lymphocytes Relative: 26.6 % (ref 12.0–46.0)
Lymphs Abs: 2.6 10*3/uL (ref 0.7–4.0)
MCHC: 33.2 g/dL (ref 30.0–36.0)
MCV: 98.3 fl (ref 78.0–100.0)
Monocytes Absolute: 0.9 10*3/uL (ref 0.1–1.0)
Monocytes Relative: 9.8 % (ref 3.0–12.0)
Neutro Abs: 5.8 10*3/uL (ref 1.4–7.7)
Neutrophils Relative %: 60.2 % (ref 43.0–77.0)
Platelets: 203 10*3/uL (ref 150.0–400.0)
RBC: 5.97 Mil/uL — ABNORMAL HIGH (ref 4.22–5.81)
RDW: 15.5 % (ref 11.5–15.5)
WBC: 9.7 10*3/uL (ref 4.0–10.5)

## 2014-11-18 LAB — BASIC METABOLIC PANEL
BUN: 21 mg/dL (ref 6–23)
CALCIUM: 9.8 mg/dL (ref 8.4–10.5)
CO2: 28 meq/L (ref 19–32)
Chloride: 104 mEq/L (ref 96–112)
Creatinine, Ser: 1.43 mg/dL (ref 0.40–1.50)
GFR: 61.71 mL/min (ref >60.00)
Glucose, Bld: 137 mg/dL — ABNORMAL HIGH (ref 70–99)
Potassium: 3.7 mEq/L (ref 3.5–5.1)
SODIUM: 140 meq/L (ref 135–145)

## 2014-11-18 LAB — LIPID PANEL
CHOLESTEROL: 123 mg/dL (ref 0–200)
HDL: 38 mg/dL — ABNORMAL LOW (ref >39.00)
LDL CALC: 65 mg/dL (ref 0–99)
NONHDL: 85
Total CHOL/HDL Ratio: 3
Triglycerides: 102 mg/dL (ref 0.0–149.0)
VLDL: 20.4 mg/dL (ref 0.0–40.0)

## 2014-11-18 NOTE — Progress Notes (Signed)
Pre visit review using our clinic review tool, if applicable. No additional management support is needed unless otherwise documented below in the visit note. 

## 2014-11-18 NOTE — Assessment & Plan Note (Signed)
Good compliance with simvastatin, check labs.

## 2014-11-18 NOTE — Assessment & Plan Note (Addendum)
Has decreased dose of glimepiride to half tablet daily and metformin to 1 tablet daily,on Trulicity. Diabetes is under excellent control, he would like to take less medication, recommend to discuss with endocrinology. He is due for a visit.

## 2014-11-18 NOTE — Patient Instructions (Signed)
Get your blood work before you leave   Please make an appointment to see endocrinology Dr. Dwyane Dee  Please go to the front and schedule a visit  In 4 months   for a physical exam     Come back fasting

## 2014-11-18 NOTE — Telephone Encounter (Signed)
Received call from [facility] lab with critical lab results.  Hgb:19.4.  Critical results verbally given to Dr. Rolla Flatten

## 2014-11-18 NOTE — Assessment & Plan Note (Signed)
Good compliance with Symbicort twice a day, takes a Spiriva "as needed" and hardly ever uses Ventolin. He is doing great, no change for now

## 2014-11-18 NOTE — Progress Notes (Signed)
Subjective:    Patient ID: Jonathan [name]., male    DOB: [birth year], 79 y.o.   MRN: [id number]  DOS:  11/18/2014 Type of visit - description : rov Interval history: Hypertension, good compliance with medications, ambulatory BPs usually in the 120/80 range. Diabetes,  medication list corrected, he continued to be active and trying to eat healthy. Has lost some weight. Ambulatory blood sugars consistently in the 130s. High cholesterol, good compliance with medicines. Due for FLP COPD, on Symbicort twice a day, uses a Spiriva rarely and has not needed ventolin lately. Likes to take lless medication, "I felt great"    Review of Systems No chest pain or difficulty breathing No nausea, vomiting, diarrhea No anxiety or depression.   Past Medical History  Diagnosis Date  . CAD (coronary artery disease)     Two RCA stents remotely / 3rd RCA stent 2006  . PVD (peripheral vascular disease)     s/p stents at LE 2009, Dr Einar Gip  . Colon polyps     s/p several Cscopes.  . Diabetes mellitus     dx aprox 2009  . Hyperlipidemia     dx in 90s  . Hypertension     dx in the 90  . ED (erectile dysfunction)     has a vacumm device  . Hypogonadism male   . Shortness of breath     O2 Sat dropped to 82% walking on the treadmill, September, 2012  . COPD (chronic obstructive pulmonary disease)     on O2 Rx  . Ejection fraction     Past Surgical History  Procedure Laterality Date  . Appendectomy    . Tonsillectomy      History   Social History  . Marital Status: Married    Spouse Name: N/A  . Number of Children: 6  . Years of Education: N/A   Occupational History  . 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 Smoking status: Former Smoker -- 2.00 packs/day for 30 years    Types: Cigarettes    Quit date: 06/17/1977  . Smokeless tobacco: Never Used     Comment: 2 ppd, quit 1987  . Alcohol Use: Yes     Comment: socially   . Drug Use: No  .  Sexual Activity: Not on file   Other Topics Concern  . Not on file   Social History Narrative   Has 2 step sons, and 4 children (lost 1 son)                        Medication List       This list is accurate as of: 11/18/14 11:59 PM.  Always use your most recent med list.               amLODipine 10 MG tablet  Commonly known as:  NORVASC  TAKE 1 TABLET DAILY     aspirin 81 MG tablet  Take 81 mg by mouth daily.     clopidogrel 75 MG tablet  Commonly known as:  PLAVIX  TAKE 1 TABLET DAILY     Dulaglutide 1.5 MG/0.5ML Sopn  Commonly known as:  TRULICITY  Inject 1 pen once a week     FREESTYLE LITE Devi  Use to check blood sugar 2 times per day dx code E10.9     glimepiride 4 MG tablet  Commonly known as:  AMARYL  Take 2 mg by mouth daily with breakfast.     glucose blood test strip  Commonly known as:  FREESTYLE LITE  Use as instructed to check blood sugar 2 times per day dx code E11.9     losartan 100 MG tablet  Commonly known as:  COZAAR  TAKE 1 TABLET DAILY     metFORMIN 1000 MG tablet  Commonly known as:  GLUCOPHAGE  Take 1,000 mg by mouth daily.     metoprolol succinate 100 MG 24 hr tablet  Commonly known as:  TOPROL-XL  TAKE 1 TABLET DAILY     simvastatin 20 MG tablet  Commonly known as:  ZOCOR  TAKE 1 TABLET AT BEDTIME     SPIRIVA HANDIHALER 18 MCG inhalation capsule  Generic drug:  tiotropium  PLACE 1 CAPSULE INTO INHALER AND INHALE THE CONTENTS OF 1 CAPSULE DAILY     SYMBICORT 160-4.5 MCG/ACT inhaler  Generic drug:  budesonide-formoterol  USE 2 INHALATIONS TWICE A DAY     Testosterone 20.25 MG/ACT (1.62%) Gel  Place 4 application onto the skin daily.     triamterene-hydrochlorothiazide 37.5-25 MG per tablet  Commonly known as:  MAXZIDE-25  TAKE 1 TABLET DAILY     VENTOLIN HFA 108 (90 BASE) MCG/ACT inhaler  Generic drug:  albuterol  Inhale 2 puffs into the lungs every 6 (six) hours as needed for wheezing or shortness of breath.            Objective:   Physical Exam BP 124/78 mmHg  Pulse 75  Temp(Src) 98 F (36.7 C) (Oral)  Ht 6\' 2"  (1.88 m)  Wt 247 lb 2 oz (112.095 kg)  BMI 31.72 kg/m2  SpO2 96% General:   Well developed, well nourished . NAD.  HEENT:  Normocephalic . Face symmetric, atraumatic Lungs:  CTA B Normal respiratory effort, no intercostal retractions, no accessory muscle use. Heart: RRR,  no murmur.  Muscle skeletal: no pretibial edema bilaterally  Skin: Not pale. Not jaundice Neurologic:  alert & oriented X3.  Speech normal, gait appropriate for age and unassisted Psych--  Cognition and judgment appear intact.  Cooperative with normal attention span and concentration.  Behavior appropriate. No anxious or depressed appearing.       Assessment & Plan:

## 2014-11-18 NOTE — Assessment & Plan Note (Signed)
Hypertension under excellent control, continue amlodipine 10 mg, losartan 100, metoprolol. Patient would like to take "less medication", however cost is not an issue he has no side effects. Recommend to continue with present care. Check a BMP and CBC

## 2014-11-26 ENCOUNTER — Other Ambulatory Visit: Payer: Self-pay | Admitting: Endocrinology

## 2014-11-30 ENCOUNTER — Other Ambulatory Visit: Payer: Self-pay | Admitting: Cardiology

## 2014-12-06 ENCOUNTER — Other Ambulatory Visit: Payer: Self-pay | Admitting: Endocrinology

## 2014-12-06 DIAGNOSIS — Z1211 Encounter for screening for malignant neoplasm of colon: Secondary | ICD-10-CM | POA: Diagnosis not present

## 2014-12-06 DIAGNOSIS — Z86010 Personal history of colonic polyps: Secondary | ICD-10-CM | POA: Diagnosis not present

## 2014-12-06 DIAGNOSIS — K5730 Diverticulosis of large intestine without perforation or abscess without bleeding: Secondary | ICD-10-CM | POA: Diagnosis not present

## 2014-12-06 LAB — HM COLONOSCOPY

## 2014-12-09 ENCOUNTER — Ambulatory Visit (INDEPENDENT_AMBULATORY_CARE_PROVIDER_SITE_OTHER): Payer: Medicare Other | Admitting: Internal Medicine

## 2014-12-09 ENCOUNTER — Encounter: Payer: Self-pay | Admitting: Internal Medicine

## 2014-12-09 VITALS — BP 128/70 | HR 80 | Ht 73.0 in | Wt 252.0 lb

## 2014-12-09 DIAGNOSIS — J432 Centrilobular emphysema: Secondary | ICD-10-CM

## 2014-12-09 NOTE — Patient Instructions (Signed)
We can continue O2 2l/ APS for sleep   We can continue the ventolin, symbicort and spiriva meds  Please call as needed

## 2014-12-09 NOTE — Progress Notes (Signed)
05/01/12- 68 yoM former smoker referred by Dr. Larose Kells for SOB.  2 PPD until 1987 Patient c/o sob and wheezing. Denies  chest pain, and chest tightness. He says he has not been as aware of shortness of breath as Dr. Larose Kells. In [employer] service a nurse commented on his labored breathing. He does not snore and does not make loud breathing noises but apparently is perceived to have  increased respiratory effort and some wheeze. This is perennial and may be worse supine. His breathing is not waking him during the night and he denies cough, choking or strangling. He is able to walk long distances. He tried Spiriva and Symbicort without effect. Denies any history of pneumonia, asthma or an anemia. History of myocardial infarction with coronary and femoral stents. He does not notice claudication. PFT- 07/10/11-  Normal spirometry flows with small- airway response to bronchodilator, normal lung volumes and moderately reduced diffusion. FEV1/FVC 0.79, DLCO 58% CXR 03/19/12-reviewed with him CHEST - 2 VIEW  Comparison: 06/28/2008  Findings: The heart size and mediastinal contours are within normal  limits. Both lungs are clear. The visualized skeletal structures  are unremarkable.  IMPRESSION:  Negative exam.  Original Report Authenticated By: Angelita Ingles, M.D.  REST Stress Myoview -05/25/11 Impression  Exercise Capacity: Good exercise capacity.  BP Response: Normal blood pressure response.  Clinical Symptoms: Mild chest pain/dyspnea.  ECG Impression: No significant ST segment change suggestive of ischemia.  Comparison with Prior Nuclear Study: No images to compare  Overall Impression: Small inferior wall infarct with mild periinfarct ischemia   06/18/12- 76 yoM former smoker followed for dyspnea/ labored breathing, complicated by CAD/ MI Gets short of breath lying in bed-may wake him. Denies snoring. Off Spiriva. 6MWT -06/18/12- 96%, 88%, 94% 449 M. Significant desaturation with exercise.  09/18/12- 54 yoM  former smoker followed for dyspnea/ labored breathing, complicated by CAD/ MI Follows For : pt states having no issues today. states that wife tells him he breaths thru his mouth  at night. pt is on  2L at night/ APS.  Overnight oximetry on room air 06/23/2012 had demonstrated over 5 hours with saturation less than 80% while asleep. We had called to start home oxygen during sleep. He now wants to travel to [place] and then to [place]. I discussed his oxygen status the reason for prescribing oxygen. I am suggesting he get a portable concentrator for this trip.  06/04/13- 47 yoM former smoker followed for dyspnea/ labored breathing, complicated by CAD/ MI FOLLOWS FOR: has been doing good with breathing since wearing O2 2L/ APS at night. Needs O2 recert for O2 uses at night and with travel (pt states he uses O2 during the day while at home if needed) [facility] frequently and often leaves his oxygen behind. Paces himself. Sleeps better at night with oxygen. Frequent wheeze but no productive cough. Continues Symbicort, Spiriva.  12/08/13- 26 yoM former smoker followed for dyspnea/ labored breathing, complicated by CAD/ MI FOLLOWS FOR: Tries to stay active but continues to have SOB. Would like to discuss O2 options-DMe is APS. Needs something smaller and lightweight. Reviewed PFT, ONOX and 6MWT. Desats with sleep and exertion. Nearly normal PFT except low DLCO. No hx PE or murmur. + hx MI and PAD. He uses standard concentrator for sleep, older portable concentrator as needed during activity and for travel. CXR 06/08/13 IMPRESSION:  No active cardiopulmonary disease.  Electronically Signed  By: Lajean Manes  On: 06/04/2013 10:59   06/10/14- 22 yoM former smoker followed  for dyspnea/ labored breathing, complicated by CAD/ MI FOLLOWS FOR:sob-better,no cough,occass. wheezing,denies cp or tightness,wears oxygen 2L at hs O2 2L/ APS Very little cough or wheeze. Uses Symbicort and Spiriva as directed and never  sees need for rescue inhaler. Cardiac status stable, controlled.   12/09/14- 10 yoM former smoker followed for dyspnea/ labored breathing, complicated by CAD/ MI, DM O2 2L/ APS for sleep FOLLOWS FOR sob better.Occasional productive cough with clear mucus. Occasional wheezing. Going to gym 3 days per week-cardio. Little cough with scant clear mucus, little wheeze. Comfortable with current medications-reviewed. CXR 06/10/14 IMPRESSION: No acute cardiopulmonary process. Electronically Signed  By: Lovey Newcomer M.D.  On: 06/10/2014 14:33  ROS-see HPI Constitutional:   No-   weight loss, night sweats, fevers, chills, fatigue, lassitude. HEENT:   No-  headaches, difficulty swallowing, tooth/dental problems, sore throat,       No-  sneezing, itching, ear ache, nasal congestion, post nasal drip,  CV:  No-   chest pain, orthopnea, PND, swelling in lower extremities, anasarca, dizziness, palpitations Resp:   + "sometimes"shortness of breath with exertion or at rest.              No-   productive cough,  No non-productive cough,  No- coughing up of blood.              No-   change in color of mucus.  + wheezing.   Skin: No-   rash or lesions. GI:  No-   heartburn, indigestion, abdominal pain, nausea, vomiting, GU:  MS:  No-   joint pain or swelling.   Neuro-     nothing unusual Psych:  No- change in mood or affect. No depression or anxiety.  No memory loss.  OBJ- Physical Examrest on room air General- Alert, Oriented, Affect-appropriate, Distress- none acute. Overweight Skin- rash-none, lesions- none, excoriation- none Lymphadenopathy- none Head- atraumatic            Eyes- Gross vision intact, PERRLA, conjunctivae and secretions clear            Ears- Hearing, canals-normal            Nose- Clear, no-Septal dev, mucus, polyps, erosion, perforation             Throat- Mallampati II , mucosa clear , drainage- none, tonsils- atrophic Neck- flexible , trachea midline, no stridor , thyroid nl,  carotid no bruit Chest - symmetrical excursion , unlabored           Heart/CV- RRR , no murmur , no gallop  , no rub, nl s1 s2                           - JVD-none , edema- none, stasis changes- none, varices- none           Lung- clear, cough-none , dullness-none, rub- none           Chest wall-  Abd-  Br/ Gen/ Rectal- Not done, not indicated Extrem- cyanosis- none, clubbing?- Question mild spooning of nails; atrophy- none, strength- nl Neuro- grossly intact to observation

## 2014-12-12 NOTE — Assessment & Plan Note (Signed)
Mild chronic emphysema to explain reduced diffusion capacity he does get some benefit from bronchodilators-Ventolin, Spiriva, Symbicort. His exercise is encouraging.

## 2014-12-13 ENCOUNTER — Other Ambulatory Visit: Payer: Self-pay | Admitting: Internal Medicine

## 2014-12-13 ENCOUNTER — Other Ambulatory Visit (INDEPENDENT_AMBULATORY_CARE_PROVIDER_SITE_OTHER): Payer: Medicare Other

## 2014-12-13 DIAGNOSIS — IMO0002 Reserved for concepts with insufficient information to code with codable children: Secondary | ICD-10-CM

## 2014-12-13 DIAGNOSIS — E1165 Type 2 diabetes mellitus with hyperglycemia: Secondary | ICD-10-CM | POA: Diagnosis not present

## 2014-12-13 LAB — BASIC METABOLIC PANEL
BUN: 12 mg/dL (ref 6–23)
CHLORIDE: 104 meq/L (ref 96–112)
CO2: 25 mEq/L (ref 19–32)
CREATININE: 1.24 mg/dL (ref 0.40–1.50)
Calcium: 9.3 mg/dL (ref 8.4–10.5)
GFR: 72.74 mL/min (ref >60.00)
Glucose, Bld: 141 mg/dL — ABNORMAL HIGH (ref 70–99)
Potassium: 3.7 mEq/L (ref 3.5–5.1)
Sodium: 138 mEq/L (ref 135–145)

## 2014-12-13 LAB — HEMOGLOBIN A1C: Hgb A1c MFr Bld: 6.9 % — ABNORMAL HIGH (ref 4.6–6.5)

## 2014-12-16 ENCOUNTER — Ambulatory Visit (INDEPENDENT_AMBULATORY_CARE_PROVIDER_SITE_OTHER): Payer: Medicare Other | Admitting: Endocrinology

## 2014-12-16 ENCOUNTER — Encounter: Payer: Self-pay | Admitting: Endocrinology

## 2014-12-16 VITALS — BP 126/88 | HR 77 | Temp 98.5°F | Resp 16 | Ht 73.0 in | Wt 248.6 lb

## 2014-12-16 DIAGNOSIS — E291 Testicular hypofunction: Secondary | ICD-10-CM | POA: Diagnosis not present

## 2014-12-16 DIAGNOSIS — E1165 Type 2 diabetes mellitus with hyperglycemia: Secondary | ICD-10-CM | POA: Diagnosis not present

## 2014-12-16 DIAGNOSIS — I10 Essential (primary) hypertension: Secondary | ICD-10-CM

## 2014-12-16 DIAGNOSIS — IMO0002 Reserved for concepts with insufficient information to code with codable children: Secondary | ICD-10-CM

## 2014-12-16 NOTE — Progress Notes (Signed)
Patient ID: Jonathan [name]., male   DOB: 1936-09-07, 79 y.o.   MRN: [id number]    Reason for Appointment:  F/u  History of Present Illness:          Diagnosis: Type 2 diabetes mellitus, date of diagnosis:  2009      Past history: He is not clear what his diabetes was diagnosed but according to hospital records it may have been in 2009. Most likely he was placed on metformin initially and at some point Amaryl added. In 2013 it was also given Januvia presumably to improve his control. However appears that his A1c has been consistently over 7% Janumet was started instead of metformin and Januvia separately in 2013   Recent history:  He was started on Trulicity in 5/28 instead of Victoza because excessive bruising on his abdomen since he has been on Plavix and aspirin Now taking metformin 1 g  a day , dose was reduced previously because of relatively high creatinine Also taking Amaryl 4 mg in the mornings although was told to try this twice a day previously His A1c is relatively higher at 6.9 even though his blood sugars have not changed but he has not brought his monitor for review Also his weight is recently unchanged     Oral hypoglycemic drugs the patient is taking UXL:KGMWNUUVO 1000 once a day, Amaryl 4 mg daily      Side effects from medications have been: None  Glucose monitoring:  done one time a day         Glucometer:   using One Touch Blood Glucose readings  by recall  PRE-MEAL Breakfast Lunch 3-4 pm Bedtime Overall  Glucose range: 130s  135    Mean/median:        Hypoglycemia:   none     Glycemic control:  Lab Results  Component Value Date   HGBA1C 6.9* 12/13/2014   HGBA1C 6.6* 07/05/2014   HGBA1C 7.4* 04/02/2014   Lab Results  Component Value Date   MICROALBUR 0.7 07/08/2014   LDLCALC 65 11/18/2014   CREATININE 1.24 12/13/2014    Self-care:      Meals: 3 meals per day. eating egg/meat breading for breakfast   Exercise: Walks 3/7 for 1 hr at gym         Dietician visit: Most recent: Unknown.               Retinal exam: Most recent: 3/15 .    Weight history: Wt Readings from Last 3 Encounters:  12/16/14 248 lb 9.6 oz (112.764 kg)  12/09/14 252 lb (114.306 kg)  11/18/14 247 lb 2 oz (112.095 kg)      Medication List       This list is accurate as of: 12/16/14 11:59 PM.  Always use your most recent med list.               amLODipine 10 MG tablet  Commonly known as:  NORVASC  TAKE 1 TABLET DAILY     aspirin 81 MG tablet  Take 81 mg by mouth daily.     clopidogrel 75 MG tablet  Commonly known as:  PLAVIX  TAKE 1 TABLET DAILY     FREESTYLE LITE Devi  Use to check blood sugar 2 times per day dx code E10.9     glimepiride 4 MG tablet  Commonly known as:  AMARYL  Take 2 mg by mouth daily with breakfast.     glucose blood test strip  Commonly  known as:  FREESTYLE LITE  Use as instructed to check blood sugar 2 times per day dx code E11.9     losartan 100 MG tablet  Commonly known as:  COZAAR  Take 1 tablet (100 mg total) by mouth daily.     metFORMIN 1000 MG tablet  Commonly known as:  GLUCOPHAGE  Take 1,000 mg by mouth daily.     metoprolol succinate 100 MG 24 hr tablet  Commonly known as:  TOPROL-XL  TAKE 1 TABLET DAILY     multivitamin with minerals Tabs tablet  Take 1 tablet by mouth daily.     simvastatin 20 MG tablet  Commonly known as:  ZOCOR  TAKE 1 TABLET AT BEDTIME     SPIRIVA HANDIHALER 18 MCG inhalation capsule  Generic drug:  tiotropium  PLACE 1 CAPSULE INTO INHALER AND INHALE THE CONTENTS OF 1 CAPSULE DAILY     SYMBICORT 160-4.5 MCG/ACT inhaler  Generic drug:  budesonide-formoterol  USE 2 INHALATIONS TWICE A DAY     Testosterone 20.25 MG/ACT (1.62%) Gel  Place 4 application onto the skin daily.     triamterene-hydrochlorothiazide 37.5-25 MG per tablet  Commonly known as:  MAXZIDE-25  TAKE 1 TABLET DAILY     TRULICITY 1.5 VE/9.3YB Sopn  Generic drug:  Dulaglutide  INJECT 1 PEN ONCE A  WEEK     VENTOLIN HFA 108 (90 BASE) MCG/ACT inhaler  Generic drug:  albuterol  Inhale 2 puffs into the lungs every 6 (six) hours as needed for wheezing or shortness of breath.        Allergies: No Known Allergies  Past Medical History  Diagnosis Date  . CAD (coronary artery disease)     Two RCA stents remotely / 3rd RCA stent 2006  . PVD (peripheral vascular disease)     s/p stents at LE 2009, Dr Einar Gip  . Colon polyps     s/p several Cscopes.  . Diabetes mellitus     dx aprox 2009  . Hyperlipidemia     dx in 90s  . Hypertension     dx in the 90  . ED (erectile dysfunction)     has a vacumm device  . Hypogonadism male   . Shortness of breath     O2 Sat dropped to 82% walking on the treadmill, September, 2012  . COPD (chronic obstructive pulmonary disease)     on O2 Rx  . Ejection fraction     Past Surgical History  Procedure Laterality Date  . Appendectomy    . Tonsillectomy      Family History  Problem Relation Age of Onset  . Heart disease Father   . Hypertension Father   . Stroke Father   . Diabetes Other     GM, nephews, many family members  . Hyperlipidemia Other     ?  . Colon cancer Neg Hx   . Prostate cancer Neg Hx   . Diabetes Paternal Aunt   . Diabetes Maternal Grandmother     Social History:  reports that he quit smoking about 37 years ago. His smoking use included Cigarettes. He has a 60 pack-year smoking history. He has never used smokeless tobacco. He reports that he drinks alcohol. He reports that he does not use illicit drugs.    Review of Systems       Lipids: He has been treated with simvastatin 20 mg with good control       Lab Results  Component Value Date  CHOL 123 11/18/2014   HDL 38.00* 11/18/2014   LDLCALC 65 11/18/2014   TRIG 102.0 11/18/2014   CHOLHDL 3 11/18/2014                      Has had erectile dysfunction for quite sometime. Also had decreased libido previously and was found to have hypogonadism.  Symptoms are  controlled with AndroGel Has not had a recent level done  Lab Results  Component Value Date   TESTOSTERONE 661 01/20/2014             Has history of Numbness for about one year along with tingling and burning in feet which is slightly better recently  HYPERTENSION: This is followed by PCP and is on 100 mg of losartan along with Maxide, amlodipine and metoprolol    LABS:  Appointment on 12/13/2014  Component Date Value Ref Range Status  . Hgb A1c MFr Bld 12/13/2014 6.9* 4.6 - 6.5 % Final   Glycemic Control Guidelines for People with Diabetes:Non Diabetic:  <6%Goal of Therapy: <7%Additional Action Suggested:  >8%   . Sodium 12/13/2014 138  135 - 145 mEq/L Final  . Potassium 12/13/2014 3.7  3.5 - 5.1 mEq/L Final  . Chloride 12/13/2014 104  96 - 112 mEq/L Final  . CO2 12/13/2014 25  19 - 32 mEq/L Final  . Glucose, Bld 12/13/2014 141* 70 - 99 mg/dL Final  . BUN 12/13/2014 12  6 - 23 mg/dL Final  . Creatinine, Ser 12/13/2014 1.24  0.40 - 1.50 mg/dL Final  . Calcium 12/13/2014 9.3  8.4 - 10.5 mg/dL Final  . GFR 12/13/2014 72.74  >60.00 mL/min Final    Physical Examination:  BP 126/88 mmHg  Pulse 77  Temp(Src) 98.5 F (36.9 C)  Resp 16  Ht 6\' 1"  (1.854 m)  Wt 248 lb 9.6 oz (112.764 kg)  BMI 32.81 kg/m2  SpO2 95%   no pedal edema    ASSESSMENT/PLAN:   Diabetes type 2, with obesity  His home blood sugars are fairly good although relatively high fasting  Weight is stable usingy along with his metformin and Amaryl A1c hasincreased slightly although still below 7% which is probably adequate for his age He does  not bring his monitor for download consistently and not clear how often he is monitoring Again  has not been checking enough readings after dinner r now he will continue the same regimen Except increase metformin to 1500 mg  Hypertension: Fairly good control, blood pressure is slightly higher than before, usually followed by PCP  Renal function is back to normal    HYPOGONADISM: He needs a follow-up testosterone level and this will be ordered   Patient Instructions  Please check blood sugars at least half the time about 2 hours after any meal and 3 times per week on waking up.  Please bring blood sugar monitor to each visit. Recommended blood sugar levels about 2 hours after meal is 140-160 and on waking up 90-120      Dequon Schnebly 12/19/2014, 6:22 PM   Note: This office note was prepared with Estate agent. Any transcriptional errors that result from this process are unintentional.

## 2014-12-16 NOTE — Patient Instructions (Signed)
Please check blood sugars at least half the time about 2 hours after any meal and 3 times per week on waking up.  Please bring blood sugar monitor to each visit. Recommended blood sugar levels about 2 hours after meal is 140-160 and on waking up 90-120

## 2014-12-17 ENCOUNTER — Other Ambulatory Visit: Payer: Self-pay | Admitting: Internal Medicine

## 2014-12-21 ENCOUNTER — Telehealth: Payer: Self-pay | Admitting: *Deleted

## 2014-12-21 NOTE — Telephone Encounter (Signed)
Jonathan [name],  Can you call to schedule him to train on bydureon?

## 2014-12-21 NOTE — Telephone Encounter (Signed)
Bydureon, needs training from [place]

## 2014-12-21 NOTE — Telephone Encounter (Signed)
Patient's insurance will no longer cover trulicity, they want bydureon or tanzeum, please advise.

## 2014-12-27 NOTE — Telephone Encounter (Signed)
error 

## 2014-12-29 ENCOUNTER — Other Ambulatory Visit: Payer: Self-pay | Admitting: Endocrinology

## 2014-12-29 ENCOUNTER — Telehealth: Payer: Self-pay | Admitting: Endocrinology

## 2014-12-29 DIAGNOSIS — E1165 Type 2 diabetes mellitus with hyperglycemia: Secondary | ICD-10-CM

## 2014-12-29 DIAGNOSIS — IMO0002 Reserved for concepts with insufficient information to code with codable children: Secondary | ICD-10-CM

## 2014-12-29 NOTE — Telephone Encounter (Signed)
Did you call pt?

## 2014-12-29 NOTE — Telephone Encounter (Signed)
Returning call to office °

## 2014-12-29 NOTE — Telephone Encounter (Signed)
Called patient, and am seeing him tomorrow morning, before his appt. With Mickel Baas to instruct him on the bydureon pen

## 2014-12-30 ENCOUNTER — Encounter: Payer: Self-pay | Admitting: Dietician

## 2014-12-30 ENCOUNTER — Encounter: Payer: Medicare Other | Attending: Endocrinology | Admitting: Dietician

## 2014-12-30 VITALS — Ht 74.0 in | Wt 250.0 lb

## 2014-12-30 DIAGNOSIS — E1165 Type 2 diabetes mellitus with hyperglycemia: Secondary | ICD-10-CM

## 2014-12-30 DIAGNOSIS — Z713 Dietary counseling and surveillance: Secondary | ICD-10-CM | POA: Insufficient documentation

## 2014-12-30 DIAGNOSIS — IMO0002 Reserved for concepts with insufficient information to code with codable children: Secondary | ICD-10-CM

## 2014-12-30 NOTE — Patient Instructions (Addendum)
Continue your exercise regime.  You are doing a great job! Be mindful of added sugar (coffee, oatmeal).  Consider changing to splenda, stevia, or other without carbohydrates Avoid grapefruit with blood pressure medications Spread your carbohydrates throughout the day Aim for 4 (60 grams) carbohydrates with each meal Aim for 0- 2 (0-30 grams) carbohydrates with each snack only if hungry. Eat more non starchy vegetables rather than fruit. Add a protein to breakfast and decrease fruit portions at that meal. Avoid regular soda.

## 2014-12-30 NOTE — Progress Notes (Signed)
  Medical Nutrition Therapy:  Appt start time: 0800 end time:  0930.   Assessment:  Primary concerns today: Patient is here alone.  Patient would like to learn to better control his blood sugar.  Patient has had type 2 DM since about 2009.  HgbA1C 6.9% 12/13/14.  Checks blood sugar before breakfast (106 this am) and before bed (113 last night).  Has been running higher in the morning per MD notes.    Patient lives with wife.  They both shop and cook.  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    Preferred Learning Style:   No preference indicated   Learning Readiness:   Contemplating  Ready  MEDICATIONS: see list which includes amaryl, glucophage, and trulicity   DIETARY INTAKE: Seldom eats out, patient bakes cakes 24-hr recall:  B (7:30-8:00 AM): 1 cup grapefruit sections, oatmeal with cinnamon, raisins, and sugar, and banana or potatoes o'brian, eggs, rare bacon and coffee with sugar Snk ( AM): none L (12-1PM): grilled cheese on honey wheat or leftovers Snk ( PM): nabs or homemade cake  D (5-6 PM): baked crabcakes, kale, summer squash and often starch Snk ( PM): potato chips or popcorn (not hungry) or smoothie made with fruit, water and agave Beverages: 2 cup coffee with sugar, water, 1 serving alcohol twice a month  Usual physical activity: Goes to the gym M/W/F for 2-2 1/2 hours.  Half of the time on cardio and the other half strengthening.  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  States that he does not want to walk other days of the week  Estimated energy needs: 2000 calories 225 g carbohydrates 125 g protein 67 g fat  Progress Towards Goal(s):  In progress.   Nutritional Diagnosis:  NB-1.1 Food and nutrition-related knowledge deficit As related to balance of carbohydrate, protein, and fat.  As evidenced by diet hx.    Intervention:  Nutrition counseling and diabetes education initiated. Discussed Carb Counting by food group as  method of portion control, reading food labels, and benefits of increased activity. Also discussed basic physiology of Diabetes, target BG ranges pre and post meals, and A1c.   Continue your exercise regime.  You are doing a great job! Be mindful of added sugar (coffee, oatmeal).  Consider changing to splenda, stevia, or other without carbohydrates Avoid grapefruit with blood pressure medications Spread your carbohydrates throughout the day Aim for 4 (60 grams) carbohydrates with each meal Aim for 0- 2 (0-30 grams) carbohydrates with each snack only if hungry. Eat more non starchy vegetables rather than fruit. Add a protein to breakfast and decrease fruit portions at that meal. Avoid regular soda.  Teaching Method Utilized:  Visual Auditory Hands on  Handouts given during visit include:  Meal plan card  Snack list  Label reading  Support group-DM  Barriers to learning/adherence to lifestyle change: none Patient verbalized how difficult changing eating habits and taste preferences is.  Demonstrated degree of understanding via:  Teach Back   Monitoring/Evaluation:  Dietary intake, exercise, label reading, and body weight prn.

## 2015-01-03 ENCOUNTER — Telehealth: Payer: Self-pay | Admitting: Nutrition

## 2015-01-03 NOTE — Telephone Encounter (Signed)
I instructed him on how to use the Bydureon pen.  He reported good understanding of this.  He said that he has about 9 Trulicity pens at home, and will use them up first before picking up his prescription of the Byrdureon pen.  I told him to call me if he can not remember how to do this when he starts this medication, and he agreed to do that.  He had no final questions.

## 2015-01-03 NOTE — Telephone Encounter (Signed)
I spoke with him, and he told me that he has an appt. Tomorrow with Antonieta Iba, and did not want to come back again.  I told him I would teach him how to use the pen a few min. Before his apt. With her.

## 2015-01-05 DIAGNOSIS — H2513 Age-related nuclear cataract, bilateral: Secondary | ICD-10-CM | POA: Diagnosis not present

## 2015-01-05 DIAGNOSIS — E119 Type 2 diabetes mellitus without complications: Secondary | ICD-10-CM | POA: Diagnosis not present

## 2015-01-05 LAB — HM DIABETES EYE EXAM

## 2015-01-07 ENCOUNTER — Ambulatory Visit (INDEPENDENT_AMBULATORY_CARE_PROVIDER_SITE_OTHER): Payer: Medicare Other | Admitting: Internal Medicine

## 2015-01-07 ENCOUNTER — Encounter: Payer: Self-pay | Admitting: Internal Medicine

## 2015-01-07 VITALS — BP 128/68 | HR 85 | Temp 97.9°F | Ht 73.0 in | Wt 248.0 lb

## 2015-01-07 DIAGNOSIS — M79606 Pain in leg, unspecified: Secondary | ICD-10-CM

## 2015-01-07 DIAGNOSIS — I10 Essential (primary) hypertension: Secondary | ICD-10-CM

## 2015-01-07 LAB — BASIC METABOLIC PANEL
BUN: 21 mg/dL (ref 6–23)
CO2: 31 mEq/L (ref 19–32)
Calcium: 9.9 mg/dL (ref 8.4–10.5)
Chloride: 102 mEq/L (ref 96–112)
Creatinine, Ser: 1.67 mg/dL — ABNORMAL HIGH (ref 0.40–1.50)
GFR: 51.58 mL/min — AB (ref >60.00)
Glucose, Bld: 73 mg/dL (ref 70–99)
POTASSIUM: 4 meq/L (ref 3.5–5.1)
SODIUM: 139 meq/L (ref 135–145)

## 2015-01-07 LAB — MAGNESIUM: Magnesium: 2.1 mg/dL (ref 1.5–2.5)

## 2015-01-07 MED ORDER — CYCLOBENZAPRINE HCL 10 MG PO TABS: 10.0000 mg | ORAL_TABLET | Freq: Every evening | ORAL | Status: DC | PRN

## 2015-01-07 NOTE — Progress Notes (Signed)
Pre visit review using our clinic review tool, if applicable. No additional management support is needed unless otherwise documented below in the visit note. 

## 2015-01-07 NOTE — Patient Instructions (Signed)
Get your blood work before you leave   Stretching twice a day  Flexeril half or one tablet at bedtime  Call if not improving

## 2015-01-07 NOTE — Progress Notes (Signed)
Subjective:    Patient ID: Jonathan Leather., male    DOB: 09-11-1936, 79 y.o.   MRN: [id number]  DOS:  01/07/2015 Type of visit - description : acute Interval history: Symptoms started 3-4 days ago: Bilateral calf pain, mostly at rest, actually decrease with walking. He admits to mild back pain which is not a new issue. One time had the pain while lying down and he noticed the need to stretch his legs to decrease the pain.    Review of Systems Denies chest pain, difficulty breathing, lower extremity edema or palpitations No recent airplane or prolonged car trips; no fall-injury Denies any skin changes in the lower extremities  Past Medical History  Diagnosis Date  . CAD (coronary artery disease)     Two RCA stents remotely / 3rd RCA stent 2006  . PVD (peripheral vascular disease)     s/p stents at LE 2009, Dr Einar Gip  . Colon polyps     s/p several Cscopes.  . Diabetes mellitus     dx aprox 2009  . Hyperlipidemia     dx in 90s  . Hypertension     dx in the 90  . ED (erectile dysfunction)     has a vacumm device  . Hypogonadism male   . Shortness of breath     O2 Sat dropped to 82% walking on the treadmill, September, 2012  . COPD (chronic obstructive pulmonary disease)     on O2 Rx  . Ejection fraction     Past Surgical History  Procedure Laterality Date  . Appendectomy    . Tonsillectomy      History   Social History  . Marital Status: Married    Spouse Name: N/A  . Number of Children: 6  . Years of Education: N/A   Occupational History  . 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 Smoking status: Former Smoker -- 2.00 packs/day for 30 years    Types: Cigarettes    Quit date: 06/17/1977  . Smokeless tobacco: Never Used     Comment: 2 ppd, quit 1987  . Alcohol Use: Yes     Comment: socially   . Drug Use: No  . Sexual Activity: Not on file   Other Topics Concern  . Not on file   Social History Narrative   Has 2  step sons, and 4 children (lost 1 son)                        Medication List       This list is accurate as of: 01/07/15 11:59 PM.  Always use your most recent med list.               amLODipine 10 MG tablet  Commonly known as:  NORVASC  TAKE 1 TABLET DAILY     aspirin 81 MG tablet  Take 81 mg by mouth daily.     clopidogrel 75 MG tablet  Commonly known as:  PLAVIX  Take 1 tablet (75 mg total) by mouth daily.     cyclobenzaprine 10 MG tablet  Commonly known as:  FLEXERIL  Take 1 tablet (10 mg total) by mouth at bedtime as needed for muscle spasms.     FREESTYLE LITE Devi  Use to check blood sugar 2 times per day dx code E10.9     glimepiride 4 MG tablet  Commonly  known as:  AMARYL  Take 2 mg by mouth daily with breakfast.     glucose blood test strip  Commonly known as:  FREESTYLE LITE  Use as instructed to check blood sugar 2 times per day dx code E11.9     losartan 100 MG tablet  Commonly known as:  COZAAR  Take 1 tablet (100 mg total) by mouth daily.     metFORMIN 1000 MG tablet  Commonly known as:  GLUCOPHAGE  Take 1,000 mg by mouth daily.     metoprolol succinate 100 MG 24 hr tablet  Commonly known as:  TOPROL-XL  Take 1 tablet (100 mg total) by mouth daily.     multivitamin with minerals Tabs tablet  Take 1 tablet by mouth daily.     simvastatin 20 MG tablet  Commonly known as:  ZOCOR  TAKE 1 TABLET AT BEDTIME     SPIRIVA HANDIHALER 18 MCG inhalation capsule  Generic drug:  tiotropium  PLACE 1 CAPSULE INTO INHALER AND INHALE THE CONTENTS OF 1 CAPSULE DAILY     SYMBICORT 160-4.5 MCG/ACT inhaler  Generic drug:  budesonide-formoterol  USE 2 INHALATIONS TWICE A DAY     Testosterone 20.25 MG/ACT (1.62%) Gel  Place 4 application onto the skin daily.     triamterene-hydrochlorothiazide 37.5-25 MG per tablet  Commonly known as:  MAXZIDE-25  TAKE 1 TABLET DAILY     TRULICITY 1.5 FE/0.7HQ Sopn  Generic drug:  Dulaglutide  INJECT 1 PEN ONCE  A WEEK     VENTOLIN HFA 108 (90 BASE) MCG/ACT inhaler  Generic drug:  albuterol  Inhale 2 puffs into the lungs every 6 (six) hours as needed for wheezing or shortness of breath.           Objective:   Physical Exam BP 128/68 mmHg  Pulse 85  Temp(Src) 97.9 F (36.6 C) (Oral)  Ht 6\' 1"  (1.854 m)  Wt 248 lb (112.492 kg)  BMI 32.73 kg/m2  SpO2 98%  General:   Well developed, well nourished . NAD.  HEENT:  Normocephalic . Face symmetric, atraumatic Abdomen:  Not distended, soft, non-tender. No rebound or rigidity. No mass,organomegaly. No bruit Muscle skeletal: no pretibial edema bilaterally  Calves symmetric and not tender Good femoral and pedal pulses bilaterally, feet are warm, good capillary refills. Skin: Not pale. Not jaundice Neurologic:  alert & oriented X3.  Speech normal, gait appropriate for age and unassisted; strength symmetric, DTRs symmetric Psych--  Cognition and judgment appear intact.  Cooperative with normal attention span and concentration.  Behavior appropriate. No anxious or depressed appearing.      Assessment & Plan:

## 2015-01-08 NOTE — Assessment & Plan Note (Signed)
Controlled, labs

## 2015-01-08 NOTE — Assessment & Plan Note (Signed)
Calves pain No evidence of a vascular problem on clinical grounds, doubt neurogenic claudication. Cramps? He is on diuretics for HTN Plan: Check a BMP - Mg Trial with Flexeril at night Recommend stretching Call if not improving

## 2015-01-21 ENCOUNTER — Other Ambulatory Visit: Payer: Self-pay | Admitting: *Deleted

## 2015-01-21 ENCOUNTER — Encounter: Payer: Self-pay | Admitting: Cardiology

## 2015-01-21 ENCOUNTER — Ambulatory Visit (INDEPENDENT_AMBULATORY_CARE_PROVIDER_SITE_OTHER): Payer: Medicare Other | Admitting: Cardiology

## 2015-01-21 VITALS — BP 122/82 | HR 79 | Ht 73.0 in | Wt 249.8 lb

## 2015-01-21 DIAGNOSIS — I2510 Atherosclerotic heart disease of native coronary artery without angina pectoris: Secondary | ICD-10-CM | POA: Diagnosis not present

## 2015-01-21 DIAGNOSIS — I10 Essential (primary) hypertension: Secondary | ICD-10-CM | POA: Diagnosis not present

## 2015-01-21 DIAGNOSIS — J439 Emphysema, unspecified: Secondary | ICD-10-CM | POA: Diagnosis not present

## 2015-01-21 MED ORDER — EXENATIDE ER 2 MG ~~LOC~~ PEN: PEN_INJECTOR | SUBCUTANEOUS | Status: DC

## 2015-01-21 NOTE — Assessment & Plan Note (Signed)
The patient has had stents in the past. His nuclear scan was done last September, 2012. There was a small inferior infarct with mild. Infarct ischemia. Ejection fraction was 55%. A more recent echo showed continued normal LV function done in March, 2015. He is on aspirin. He is on simvastatin. His most recent LDL was 65. He is over 79 years of age. Therefore I am not pushing to change him to higher dose statins.

## 2015-01-21 NOTE — Progress Notes (Signed)
Cardiology Office Note   Date:  01/21/2015   ID:  Jonathan [name]., DOB 09-01-1936, MRN [id number]  PCP:  Kathlene November, MD  Cardiologist:  Dola Argyle, MD   Chief Complaint  Patient presents with  . Appointment    Follow-up coronary artery disease      History of Present Illness: Reason Jonathan [name]. is a 79 y.o. male who presents today to follow-up coronary artery disease. He's doing very well. He is not having any chest pain or significant shortness of breath. He is getting pulmonary rehabilitation. He follows carefully with all of his other physicians. He knows his medicines well.    Past Medical History  Diagnosis Date  . CAD (coronary artery disease)     Two RCA stents remotely / 3rd RCA stent 2006  . PVD (peripheral vascular disease)     s/p stents at LE 2009, Dr Einar Gip  . Colon polyps     s/p several Cscopes.  . Diabetes mellitus     dx aprox 2009  . Hyperlipidemia     dx in 90s  . Hypertension     dx in the 90  . ED (erectile dysfunction)     has a vacumm device  . Hypogonadism male   . Shortness of breath     O2 Sat dropped to 82% walking on the treadmill, September, 2012  . COPD (chronic obstructive pulmonary disease)     on O2 Rx  . Ejection fraction     Past Surgical History  Procedure Laterality Date  . Appendectomy    . Tonsillectomy      Patient Active Problem List   Diagnosis Date Noted  . Hypogonadism male 12/16/2014  . Chronic kidney disease, stage II (mild) 02/28/2014  . Diabetes mellitus type II, uncontrolled 02/01/2014  . Pain in lower limb (calves) 01/08/2014  . Ejection fraction   . Pain in toe 04/10/2013  . Onychomycosis 04/10/2013  . Essential tremor 10/14/2012  . Medicare annual wellness visit, subsequent 12/11/2011  . Vitamin D deficiency 12/11/2011  . Back pain 06/18/2011  . COPD (chronic obstructive pulmonary disease) with emphysema   . CAD (coronary artery disease)   . PVD (peripheral vascular disease)   . HEADACHE  10/06/2010  . HYPOGONADISM 06/21/2010  . DMII (diabetes mellitus, type 2) 03/17/2010  . Hyperlipidemia 03/17/2010  . Essential hypertension 03/17/2010      Current Outpatient Prescriptions  Medication Sig Dispense Refill  . albuterol (VENTOLIN HFA) 108 (90 BASE) MCG/ACT inhaler Inhale 2 puffs into the lungs every 6 (six) hours as needed for wheezing or shortness of breath.    Marland Kitchen amLODipine (NORVASC) 10 MG tablet TAKE 1 TABLET DAILY 90 tablet 0  . aspirin 81 MG tablet Take 81 mg by mouth daily.      . clopidogrel (PLAVIX) 75 MG tablet Take 1 tablet (75 mg total) by mouth daily. 90 tablet 1  . cyclobenzaprine (FLEXERIL) 10 MG tablet Take 1 tablet (10 mg total) by mouth at bedtime as needed for muscle spasms. 21 tablet 0  . glimepiride (AMARYL) 4 MG tablet Take 2 mg by mouth daily with breakfast.    . losartan (COZAAR) 100 MG tablet Take 1 tablet (100 mg total) by mouth daily. 90 tablet 2  . metFORMIN (GLUCOPHAGE) 1000 MG tablet Take 1,000 mg by mouth daily.    . metoprolol succinate (TOPROL-XL) 100 MG 24 hr tablet Take 1 tablet (100 mg total) by mouth daily. 90 tablet 1  .  Multiple Vitamin (MULTIVITAMIN WITH MINERALS) TABS tablet Take 1 tablet by mouth daily.    . simvastatin (ZOCOR) 20 MG tablet TAKE 1 TABLET AT BEDTIME 90 tablet 2  . SPIRIVA HANDIHALER 18 MCG inhalation capsule PLACE 1 CAPSULE INTO INHALER AND INHALE THE CONTENTS OF 1 CAPSULE DAILY 1 capsule 0  . SYMBICORT 160-4.5 MCG/ACT inhaler USE 2 INHALATIONS TWICE A DAY 3 Inhaler 1  . Testosterone 20.25 MG/ACT (1.62%) GEL Place 4 application onto the skin daily. 225 g 3  . triamterene-hydrochlorothiazide (MAXZIDE-25) 37.5-25 MG per tablet TAKE 1 TABLET DAILY 90 tablet 2  . TRULICITY 1.5 CH/8.5ID SOPN INJECT 1 PEN ONCE A WEEK 12 pen 1  . [DISCONTINUED] sitaGLIPtan (JANUVIA) 100 MG tablet Take 1 tablet (100 mg total) by mouth daily. 90 tablet 1   No current facility-administered medications for this visit.    Allergies:   Review of  patient's allergies indicates no known allergies.    Social History:  The patient  reports that he quit smoking about 37 years ago. His smoking use included Cigarettes. He has a 60 pack-year smoking history. He has never used smokeless tobacco. He reports that he drinks alcohol. He reports that he does not use illicit drugs.   Family History:  The patient's family history includes Diabetes in his maternal grandmother, other, and paternal aunt; Heart disease in his father; Hyperlipidemia in his other; Hypertension in his father; Stroke in his father. There is no history of Colon cancer or Prostate cancer.    ROS:  Please see the history of present illness.     Patient denies fever, chills, headache, sweats, rash, change in vision, change in hearing, chest pain, cough, nausea or vomiting, urinary symptoms. All other systems are reviewed and are negative.   PHYSICAL EXAM: VS:  BP 122/82 mmHg  Pulse 79  Ht 6\' 1"  (1.854 m)  Wt 249 lb 12.8 oz (113.309 kg)  BMI 32.96 kg/m2 , the patient is oriented to person time and place. Affect is normal. Head is atraumatic. Sclera and conjunctiva are normal. There is no jugular venous distention. Lungs are clear. Respiratory effort is nonlabored. Cardiac exam reveals an S1 and S2. The abdomen is soft. There is no peripheral edema. There are no musculoskeletal deformities. There are no skin rashes. Neurologic is grossly intact.  EKG:   EKG is done today and reviewed by me. There is no significant abnormality. There is normal sinus rhythm.   Recent Labs: 07/05/2014: ALT 19 11/18/2014: Hemoglobin 19.4 Repeated and verified X2.*; Platelets 203.0 01/07/2015: BUN 21; Creatinine 1.67*; Magnesium 2.1; Potassium 4.0; Sodium 139    Lipid Panel    Component Value Date/Time   CHOL 123 11/18/2014 0900   TRIG 102.0 11/18/2014 0900   HDL 38.00* 11/18/2014 0900   CHOLHDL 3 11/18/2014 0900   VLDL 20.4 11/18/2014 0900   LDLCALC 65 11/18/2014 0900      Wt Readings from  Last 3 Encounters:  01/21/15 249 lb 12.8 oz (113.309 kg)  01/07/15 248 lb (112.492 kg)  12/30/14 250 lb (113.399 kg)      Current medicines are reviewed  The patient understands his medicines well. He has corrected our list informing us that he is no longer on Trulicity. He is on a new different type of medicine and we are updating the records appropriately.     ASSESSMENT AND PLAN:

## 2015-01-21 NOTE — Assessment & Plan Note (Signed)
Blood pressures controlled. No change in therapy. 

## 2015-01-21 NOTE — Patient Instructions (Signed)
**Note De-identified Jonathan [name] Obfuscation** Medication Instructions:  Same  Labwork: None  Testing/Procedures: None  Follow-Up: Your physician wants you to follow-up in: 1 year. You will receive a reminder letter in the mail two months in advance. If you don't receive a letter, please call our office to schedule the follow-up appointment.      

## 2015-01-21 NOTE — Assessment & Plan Note (Signed)
The patient has pulmonary disease. He uses oxygen at night. Echo in April, 2015 showed normal right ventricular function. There was no tricuspid regurgitation jet, so right heart pressure could not be estimated. No further cardiac workup.

## 2015-02-09 ENCOUNTER — Other Ambulatory Visit: Payer: Self-pay | Admitting: Internal Medicine

## 2015-02-25 ENCOUNTER — Other Ambulatory Visit: Payer: Self-pay | Admitting: Internal Medicine

## 2015-02-27 ENCOUNTER — Other Ambulatory Visit: Payer: Self-pay | Admitting: Cardiology

## 2015-02-28 ENCOUNTER — Telehealth: Payer: Self-pay | Admitting: Internal Medicine

## 2015-02-28 NOTE — Telephone Encounter (Signed)
Pre Visit letter sent  °

## 2015-03-14 ENCOUNTER — Other Ambulatory Visit: Payer: Self-pay

## 2015-03-17 ENCOUNTER — Other Ambulatory Visit: Payer: Self-pay

## 2015-03-18 ENCOUNTER — Telehealth: Payer: Self-pay | Admitting: *Deleted

## 2015-03-18 ENCOUNTER — Encounter: Payer: Self-pay | Admitting: *Deleted

## 2015-03-18 NOTE — Telephone Encounter (Signed)
Pre-Visit Call completed with patient and chart updated.   Pre-Visit Info documented in Specialty Comments under [employer].    

## 2015-03-22 ENCOUNTER — Encounter: Payer: Self-pay | Admitting: Internal Medicine

## 2015-03-22 ENCOUNTER — Ambulatory Visit (INDEPENDENT_AMBULATORY_CARE_PROVIDER_SITE_OTHER): Payer: Medicare Other | Admitting: Internal Medicine

## 2015-03-22 DIAGNOSIS — I2510 Atherosclerotic heart disease of native coronary artery without angina pectoris: Secondary | ICD-10-CM | POA: Diagnosis not present

## 2015-03-22 DIAGNOSIS — Z0000 Encounter for general adult medical examination without abnormal findings: Secondary | ICD-10-CM | POA: Diagnosis not present

## 2015-03-22 DIAGNOSIS — E785 Hyperlipidemia, unspecified: Secondary | ICD-10-CM | POA: Diagnosis not present

## 2015-03-22 DIAGNOSIS — J439 Emphysema, unspecified: Secondary | ICD-10-CM | POA: Diagnosis not present

## 2015-03-22 DIAGNOSIS — E291 Testicular hypofunction: Secondary | ICD-10-CM

## 2015-03-22 DIAGNOSIS — I10 Essential (primary) hypertension: Secondary | ICD-10-CM | POA: Diagnosis not present

## 2015-03-22 NOTE — Patient Instructions (Signed)
Get your blood work before you leave    Please consider visit these websites for more information:  www.begintheconversation.org  theconversationproject.org    Fall Prevention and Home Safety Falls cause injuries and can affect all age groups. It is possible to use preventive measures to significantly decrease the likelihood of falls. There are many simple measures which can make your home safer and prevent falls. OUTDOORS  Repair cracks and edges of walkways and driveways.  Remove high doorway thresholds.  Trim shrubbery on the main path into your home.  Have good outside lighting.  Clear walkways of tools, rocks, debris, and clutter.  Check that handrails are not broken and are securely fastened. Both sides of steps should have handrails.  Have leaves, snow, and ice cleared regularly.  Use sand or salt on walkways during winter months.  In the garage, clean up grease or oil spills. BATHROOM  Install night lights.  Install grab bars by the toilet and in the tub and shower.  Use non-skid mats or decals in the tub or shower.  Place a plastic non-slip stool in the shower to sit on, if needed.  Keep floors dry and clean up all water on the floor immediately.  Remove soap buildup in the tub or shower on a regular basis.  Secure bath mats with non-slip, double-sided rug tape.  Remove throw rugs and tripping hazards from the floors. BEDROOMS  Install night lights.  Make sure a bedside light is easy to reach.  Do not use oversized bedding.  Keep a telephone by your bedside.  Have a firm chair with side arms to use for getting dressed.  Remove throw rugs and tripping hazards from the floor. KITCHEN  Keep handles on pots and pans turned toward the center of the stove. Use back burners when possible.  Clean up spills quickly and allow time for drying.  Avoid walking on wet floors.  Avoid hot utensils and knives.  Position shelves so they are not too high  or low.  Place commonly used objects within easy reach.  If necessary, use a sturdy step stool with a grab bar when reaching.  Keep electrical cables out of the way.  Do not use floor polish or wax that makes floors slippery. If you must use wax, use non-skid floor wax.  Remove throw rugs and tripping hazards from the floor. STAIRWAYS  Never leave objects on stairs.  Place handrails on both sides of stairways and use them. Fix any loose handrails. Make sure handrails on both sides of the stairways are as long as the stairs.  Check carpeting to make sure it is firmly attached along stairs. Make repairs to worn or loose carpet promptly.  Avoid placing throw rugs at the top or bottom of stairways, or properly secure the rug with carpet tape to prevent slippage. Get rid of throw rugs, if possible.  Have an electrician put in a light switch at the top and bottom of the stairs. OTHER FALL PREVENTION TIPS  Wear low-heel or rubber-soled shoes that are supportive and fit well. Wear closed toe shoes.  When using a stepladder, make sure it is fully opened and both spreaders are firmly locked. Do not climb a closed stepladder.  Add color or contrast paint or tape to grab bars and handrails in your home. Place contrasting color strips on first and last steps.  Learn and use mobility aids as needed. Install an electrical emergency response system.  Turn on lights to avoid dark areas.   Replace light bulbs that burn out immediately. Get light switches that glow.  Arrange furniture to create clear pathways. Keep furniture in the same place.  Firmly attach carpet with non-skid or double-sided tape.  Eliminate uneven floor surfaces.  Select a carpet pattern that does not visually hide the edge of steps.  Be aware of all pets. OTHER HOME SAFETY TIPS  Set the water temperature for 120 F (48.8 C).  Keep emergency numbers on or near the telephone.  Keep smoke detectors on every level of  the home and near sleeping areas. Document Released: 08/24/2002 Document Revised: 03/04/2012 Document Reviewed: 11/23/2011 Exit[employer] Patient Information 2015 [employer]. This information is not intended to replace advice given to you by your health care provider. Make sure you discuss any questions you have with your health care provider.   Preventive Care for Adults Ages 65 and over  Blood pressure check.** / Every 1 to 2 years.  Lipid and cholesterol check.**/ Every 5 years beginning at age 20.  Lung cancer screening. / Every year if you are aged 55-80 years and have a 30-pack-year history of smoking and currently smoke or have quit within the past 15 years. Yearly screening is stopped once you have quit smoking for at least 15 years or develop a health problem that would prevent you from having lung cancer treatment.  Fecal occult blood test (FOBT) of stool. / Every year beginning at age 50 and continuing until age 75. You may not have to do this test if you get a colonoscopy every 10 years.  Flexible sigmoidoscopy** or colonoscopy.** / Every 5 years for a flexible sigmoidoscopy or every 10 years for a colonoscopy beginning at age 50 and continuing until age 75.  Hepatitis C blood test.** / For all people born from 1945 through 1965 and any individual with known risks for hepatitis C.  Abdominal aortic aneurysm (AAA) screening.** / A one-time screening for ages 65 to 75 years who are current or former smokers.  Skin self-exam. / Monthly.  Influenza vaccine. / Every year.  Tetanus, diphtheria, and acellular pertussis (Tdap/Td) vaccine.** / 1 dose of Td every 10 years.  Varicella vaccine.** / Consult your health care provider.  Zoster vaccine.** / 1 dose for adults aged 60 years or older.  Pneumococcal 13-valent conjugate (PCV13) vaccine.** / Consult your health care provider.  Pneumococcal polysaccharide (PPSV23) vaccine.** / 1 dose for all adults aged 65 years and  older.  Meningococcal vaccine.** / Consult your health care provider.  Hepatitis A vaccine.** / Consult your health care provider.  Hepatitis B vaccine.** / Consult your health care provider.  Haemophilus influenzae type b (Hib) vaccine.** / Consult your health care provider. **Family history and personal history of risk and conditions may change your health care provider's recommendations. Document Released: 10/30/2001 Document Revised: 09/08/2013 Document Reviewed: 01/29/2011 Exit[employer] Patient Information 2015 [employer]. This information is not intended to replace advice given to you by your health care provider. Make sure you discuss any questions you have with your health care provider.   

## 2015-03-22 NOTE — Progress Notes (Signed)
Subjective:    Patient ID: Jonathan Leather., male    DOB: 11/20/1935, 79 y.o.   MRN: [id number]  DOS:  03/22/2015 Type of visit - description :    Here for Medicare AWV:   1. Risk factors based on Past M, S, F history:reviewed   2. Physical Activities: active, does walk in the treadmil  x 2-3 /week and some lifting  3. Depression/mood: (-) screening 4. Hearing: no problems noted today, denies issues himself   5. ADL's: totally independent  , drives   6. Fall Risk: see instructions , no h/o falls  , prevention discussed  7. Home Safety: does feelsafe at home   8. Height, weight, &visual acuity: see VS, vision corrected w/ glasses, sees eye doctor regularly   9. Counseling: yes, see below   10. Labs ordered based on risk factors: yes   11. Referral Coordination, if needed   12. Care Plan, see assessment and plan  , written plan provided  13. Cognitive Assessment, motor skills, memory and cognition seem appropriate  14. Care team updated 15. End of life care dicussed   additionally, we discussed the following issues Diabetes, hypogonadism: Under the care of endocrinology COPD, good compliance with inhalers, very rarely uses a rescue inhaler Hypertension, ambulatory BPs in the 120/70 High cholesterol, good compliance of medication, due for LFTs    Review of Systems Constitutional: No fever. No chills. No unexplained wt changes. No unusual sweats  HEENT: No dental problems, no ear discharge, no facial swelling, no voice changes. No eye discharge, no eye  redness , no  intolerance to light   Respiratory: No wheezing , no  difficulty breathing. No cough , no mucus production  Cardiovascular: No CP, no leg swelling , no  Palpitations. No palpitations  GI: no nausea, no vomiting, no diarrhea , no  abdominal pain.  No blood in the stools. No dysphagia, no odynophagia    Endocrine: No polyphagia, no polyuria , no polydipsia  GU: No dysuria, gross hematuria, difficulty  urinating. No urinary urgency, no frequency.  Musculoskeletal: No joint swellings or unusual aches or pains  Skin: No change in the color of the skin, palor , no  Rash  Allergic, immunologic: No environmental allergies , no  food allergies  Neurological: No dizziness no  syncope. No headaches. No diplopia, no slurred, no slurred speech, no motor deficits, no facial  Numbness  Hematological: No enlarged lymph nodes, no easy bruising , no unusual bleedings  Psychiatry: No suicidal ideas, no hallucinations, no beavior problems, no confusion.  No unusual/severe anxiety, no depression    Past Medical History  Diagnosis Date  . CAD (coronary artery disease)     Two RCA stents remotely / 3rd RCA stent 2006  . PVD (peripheral vascular disease)     s/p stents at LE 2009, Dr Einar Gip  . Colon polyps     s/p several Cscopes.  . Diabetes mellitus     dx aprox 2009  . Hyperlipidemia     dx in 90s  . Hypertension     dx in the 90  . ED (erectile dysfunction)     has a vacumm device  . Hypogonadism male   . Shortness of breath     O2 Sat dropped to 82% walking on the treadmill, September, 2012  . COPD (chronic obstructive pulmonary disease)     on O2, nocturnal  . Ejection fraction     Past Surgical History  Procedure Laterality  Date  . Appendectomy    . Tonsillectomy      History   Social History  . Marital Status: Married    Spouse Name: N/A  . Number of Children: 6  . Years of Education: N/A   Occupational History  . 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 Smoking status: Former Smoker -- 2.00 packs/day for 30 years    Types: Cigarettes    Quit date: 06/17/1977  . Smokeless tobacco: Never Used     Comment: 2 ppd, quit 1987  . Alcohol Use: Yes     Comment: socially   . Drug Use: No  . Sexual Activity: Not on file   Other Topics Concern  . Not on file   Social History Narrative   Has 2 step sons, and 4 children (lost 1 son)                      Family History  Problem Relation Age of Onset  . Heart disease Father   . Hypertension Father   . Stroke Father   . Diabetes Other     GM, nephews, many family members  . Hyperlipidemia Other     ?  . Colon cancer Neg Hx   . Prostate cancer Neg Hx   . Diabetes Paternal Aunt   . Diabetes Maternal Grandmother        Medication List       This list is accurate as of: 03/22/15 11:59 PM.  Always use your most recent med list.               amLODipine 10 MG tablet  Commonly known as:  NORVASC  TAKE 1 TABLET DAILY     aspirin 81 MG tablet  Take 81 mg by mouth daily.     clopidogrel 75 MG tablet  Commonly known as:  PLAVIX  Take 1 tablet (75 mg total) by mouth daily.     Exenatide ER 2 MG Pen  Commonly known as:  BYDUREON  Inject contents of one pen once a week     glimepiride 4 MG tablet  Commonly known as:  AMARYL  TAKE ONE AND ONE-HALF TABLETS DAILY     losartan 100 MG tablet  Commonly known as:  COZAAR  Take 1 tablet (100 mg total) by mouth daily.     metFORMIN 1000 MG tablet  Commonly known as:  GLUCOPHAGE  Take 1,000 mg by mouth daily.     metoprolol succinate 100 MG 24 hr tablet  Commonly known as:  TOPROL-XL  Take 1 tablet (100 mg total) by mouth daily.     multivitamin with minerals Tabs tablet  Take 1 tablet by mouth daily.     simvastatin 20 MG tablet  Commonly known as:  ZOCOR  Take 1 tablet (20 mg total) by mouth at bedtime.     SPIRIVA HANDIHALER 18 MCG inhalation capsule  Generic drug:  tiotropium  PLACE 1 CAPSULE INTO INHALER AND INHALE THE CONTENTS OF 1 CAPSULE DAILY     SYMBICORT 160-4.5 MCG/ACT inhaler  Generic drug:  budesonide-formoterol  USE 2 INHALATIONS TWICE A DAY     Testosterone 20.25 MG/ACT (1.62%) Gel  Place 4 application onto the skin daily.     triamterene-hydrochlorothiazide 37.5-25 MG per tablet  Commonly known as:  MAXZIDE-25  TAKE 1 TABLET DAILY     VENTOLIN HFA 108 (90 BASE) MCG/ACT  inhaler  Generic drug:  albuterol  Inhale 2 puffs into the lungs every 6 (six) hours as needed for wheezing or shortness of breath.           Objective:   Physical Exam BP 124/66 mmHg  Pulse 77  Temp(Src) 98.4 F (36.9 C) (Oral)  Ht 6\' 1"  (1.854 m)  Wt 247 lb 6 oz (112.209 kg)  BMI 32.64 kg/m2  SpO2 93% General:   Well developed, well nourished . NAD.  HEENT:  Normocephalic . Face symmetric, atraumatic Lungs:  CTA B Normal respiratory effort, no intercostal retractions, no accessory muscle use. Heart: RRR,  no murmur.  no pretibial edema bilaterally  Abdomen:  Not distended, soft, non-tender. No rebound or rigidity. No mass,organomegaly. No bruit Skin: Not pale. Not jaundice Neurologic:  alert & oriented X3.  Speech normal, gait appropriate for age and unassisted Psych--  Cognition and judgment appear intact.  Cooperative with normal attention span and concentration.  Behavior appropriate. No anxious or depressed appearing.       Assessment & Plan:     hypertension: Well controlled on losartan, metoprolol, amlodipine and Maxzide  High cholesterol, well-controlled last FLP, will check AST, ALT  diabetes, per endocrinology

## 2015-03-22 NOTE — Assessment & Plan Note (Addendum)
Td 2016 Pneumonia shot --2011 prevnar 2015 zostavax-- states he got it at [facility], no records   Colonoscopy-- h/o polyps in colon, s/p several Cscopes per pt  , last cscope 2011 w/ Dr Collene Mares  roports a cscope 2016, will call for records     DRE (-) 12-2012 , PSA 2015 wnl-- re asses need of further screening next year  Diet-exercise discussed

## 2015-03-22 NOTE — Progress Notes (Signed)
Pre visit review using our clinic review tool, if applicable. No additional management support is needed unless otherwise documented below in the visit note. 

## 2015-03-23 ENCOUNTER — Telehealth: Payer: Self-pay

## 2015-03-23 LAB — CBC WITH DIFFERENTIAL/PLATELET
Basophils Absolute: 0.1 10*3/uL (ref 0.0–0.1)
Basophils Relative: 0.5 % (ref 0.0–3.0)
EOS ABS: 0.3 10*3/uL (ref 0.0–0.7)
Eosinophils Relative: 2.7 % (ref 0.0–5.0)
Hemoglobin: 18 g/dL (ref 13.0–17.0)
LYMPHS ABS: 3.3 10*3/uL (ref 0.7–4.0)
Lymphocytes Relative: 34.9 % (ref 12.0–46.0)
MCHC: 33.1 g/dL (ref 30.0–36.0)
MCV: 99.1 fl (ref 78.0–100.0)
MONO ABS: 1 10*3/uL (ref 0.1–1.0)
Monocytes Relative: 10.7 % (ref 3.0–12.0)
Neutro Abs: 4.9 10*3/uL (ref 1.4–7.7)
Neutrophils Relative %: 51.2 % (ref 43.0–77.0)
Platelets: 194 10*3/uL (ref 150.0–400.0)
RBC: 5.51 Mil/uL (ref 4.22–5.81)
RDW: 15.4 % (ref 11.5–15.5)
WBC: 9.6 10*3/uL (ref 4.0–10.5)

## 2015-03-23 LAB — TESTOSTERONE, FREE, TOTAL, SHBG
Sex Hormone Binding: 36 nmol/L (ref 22–77)
TESTOSTERONE FREE: 173.5 pg/mL (ref 47.0–244.0)
TESTOSTERONE-% FREE: 2.2 % (ref 1.6–2.9)
TESTOSTERONE: 797 ng/dL (ref 300–890)

## 2015-03-23 LAB — AST: AST: 18 U/L (ref 0–37)

## 2015-03-23 LAB — ALT: ALT: 22 U/L (ref 0–53)

## 2015-03-23 NOTE — Telephone Encounter (Signed)
Spoke with Santiago Glad from [facility] main lab.  Patient Hemoglobin 18.0 Hematocrit 54.5 Notified provider.

## 2015-03-24 NOTE — Assessment & Plan Note (Signed)
Good compliance with inhalers, on oxygen at night most nights

## 2015-03-24 NOTE — Assessment & Plan Note (Signed)
Hypogonadism, per endocrinology. PSA a year ago normal, last hemoglobin slightly elevated. Plan: Check a CBC and a testosterone level, we'll forward testosterone results to endocrinology

## 2015-03-30 ENCOUNTER — Ambulatory Visit: Payer: TRICARE For Life (TFL) | Admitting: Internal Medicine

## 2015-04-15 ENCOUNTER — Other Ambulatory Visit: Payer: Medicare Other

## 2015-04-15 ENCOUNTER — Ambulatory Visit (INDEPENDENT_AMBULATORY_CARE_PROVIDER_SITE_OTHER): Payer: Medicare Other | Admitting: Endocrinology

## 2015-04-15 ENCOUNTER — Encounter: Payer: Self-pay | Admitting: Endocrinology

## 2015-04-15 VITALS — BP 124/70 | HR 80 | Temp 98.2°F | Resp 16 | Ht 73.0 in | Wt 249.2 lb

## 2015-04-15 DIAGNOSIS — E119 Type 2 diabetes mellitus without complications: Secondary | ICD-10-CM | POA: Diagnosis not present

## 2015-04-15 DIAGNOSIS — I2510 Atherosclerotic heart disease of native coronary artery without angina pectoris: Secondary | ICD-10-CM

## 2015-04-15 DIAGNOSIS — E291 Testicular hypofunction: Secondary | ICD-10-CM | POA: Diagnosis not present

## 2015-04-15 LAB — POCT GLYCOSYLATED HEMOGLOBIN (HGB A1C): HEMOGLOBIN A1C: 6.2

## 2015-04-15 NOTE — Patient Instructions (Addendum)
Check blood sugars on waking up .. 2 .. times a week Also check blood sugars about 2 hours after a meal and do this after different meals by rotation  Recommended blood sugar levels on waking up is 90-130 and about 2 hours after meal is 140-180 Please bring blood sugar monitor to each visit.  Use only 1 pump Androgel once daily only

## 2015-04-15 NOTE — Progress Notes (Signed)
Patient ID: Jonathan [name]., male   DOB: May 27, 1936, 79 y.o.   MRN: [id number]    Reason for Appointment:  F/u  History of Present Illness:          Diagnosis: Type 2 diabetes mellitus, date of diagnosis:  2009      Past history: He is not clear what his diabetes was diagnosed but according to hospital records it may have been in 2009. Most likely he was placed on metformin initially and at some point Amaryl added. In 2013 it was also given Januvia presumably to improve his control. However appears that his A1c has been consistently over 7% Janumet was started instead of metformin and Januvia separately in 2013   Recent history:  He was started on Trulicity in 3/76 instead of Victoza because excessive bruising on his abdomen since he has been on Plavix and aspirin Now taking metformin 1 g  a day , dose was reduced previously because of relatively high creatinine Also taking Amaryl 4 mg in the mornings   He has done well with overall compliance and his A1c is now down to 6.2.  He has not brought his monitor for download but he thinks blood sugars are consistently controlled. Also his weight is recently unchanged. He is still compliant with exercise regimen     Oral hypoglycemic drugs the patient is taking are: Metformin 1000 once a day, Amaryl 4 mg daily in am     Side effects from medications have been: None  Glucose monitoring:  done  Once or twice a day         Glucometer:  One Touch Blood Glucose readings  by recall  PRE-MEAL Breakfast Lunch   pcs Overall  Glucose range:  100-120   <120   Mean/median:        Hypoglycemia:   none     Glycemic control:  Lab Results  Component Value Date   HGBA1C 6.2 04/15/2015   HGBA1C 6.9* 12/13/2014   HGBA1C 6.6* 07/05/2014   Lab Results  Component Value Date   MICROALBUR 0.7 07/08/2014   LDLCALC 65 11/18/2014   CREATININE 1.67* 01/07/2015    Self-care:      Meals: 3 meals per day. eating egg/meat breading for breakfast    Exercise: Walks 3-4/7 for 1 hr at gym        Dietician visit: Most recent: Unknown.               Retinal exam: Most recent: 3/15 .    Weight history:  Wt Readings from Last 3 Encounters:  04/15/15 249 lb 3.2 oz (113.036 kg)  03/22/15 247 lb 6 oz (112.209 kg)  01/21/15 249 lb 12.8 oz (113.309 kg)      Medication List       This list is accurate as of: 04/15/15 11:59 PM.  Always use your most recent med list.               amLODipine 10 MG tablet  Commonly known as:  NORVASC  TAKE 1 TABLET DAILY     aspirin 81 MG tablet  Take 81 mg by mouth daily.     clopidogrel 75 MG tablet  Commonly known as:  PLAVIX  Take 1 tablet (75 mg total) by mouth daily.     Exenatide ER 2 MG Pen  Commonly known as:  BYDUREON  Inject contents of one pen once a week     glimepiride 4 MG tablet  Commonly known  as:  AMARYL  TAKE ONE AND ONE-HALF TABLETS DAILY     losartan 100 MG tablet  Commonly known as:  COZAAR  Take 1 tablet (100 mg total) by mouth daily.     metFORMIN 1000 MG tablet  Commonly known as:  GLUCOPHAGE  Take 1,000 mg by mouth daily.     metoprolol succinate 100 MG 24 hr tablet  Commonly known as:  TOPROL-XL  Take 1 tablet (100 mg total) by mouth daily.     multivitamin with minerals Tabs tablet  Take 1 tablet by mouth daily.     simvastatin 20 MG tablet  Commonly known as:  ZOCOR  Take 1 tablet (20 mg total) by mouth at bedtime.     SPIRIVA HANDIHALER 18 MCG inhalation capsule  Generic drug:  tiotropium  PLACE 1 CAPSULE INTO INHALER AND INHALE THE CONTENTS OF 1 CAPSULE DAILY     SYMBICORT 160-4.5 MCG/ACT inhaler  Generic drug:  budesonide-formoterol  USE 2 INHALATIONS TWICE A DAY     Testosterone 20.25 MG/ACT (1.62%) Gel  Place 4 application onto the skin daily.     triamterene-hydrochlorothiazide 37.5-25 MG per tablet  Commonly known as:  MAXZIDE-25  TAKE 1 TABLET DAILY     VENTOLIN HFA 108 (90 BASE) MCG/ACT inhaler  Generic drug:  albuterol  Inhale  2 puffs into the lungs every 6 (six) hours as needed for wheezing or shortness of breath.        Allergies: No Known Allergies  Past Medical History  Diagnosis Date  . CAD (coronary artery disease)     Two RCA stents remotely / 3rd RCA stent 2006  . PVD (peripheral vascular disease)     s/p stents at LE 2009, Dr Einar Gip  . Colon polyps     s/p several Cscopes.  . Diabetes mellitus     dx aprox 2009  . Hyperlipidemia     dx in 90s  . Hypertension     dx in the 90  . ED (erectile dysfunction)     has a vacumm device  . Hypogonadism male   . Shortness of breath     O2 Sat dropped to 82% walking on the treadmill, September, 2012  . COPD (chronic obstructive pulmonary disease)     on O2, nocturnal  . Ejection fraction     Past Surgical History  Procedure Laterality Date  . Appendectomy    . Tonsillectomy      Family History  Problem Relation Age of Onset  . Heart disease Father   . Hypertension Father   . Stroke Father   . Diabetes Other     GM, nephews, many family members  . Hyperlipidemia Other     ?  . Colon cancer Neg Hx   . Prostate cancer Neg Hx   . Diabetes Paternal Aunt   . Diabetes Maternal Grandmother     Social History:  reports that he quit smoking about 37 years ago. His smoking use included Cigarettes. He has a 60 pack-year smoking history. He has never used smokeless tobacco. He reports that he drinks alcohol. He reports that he does not use illicit drugs.    Review of Systems       Lipids: He has been treated with simvastatin 20 mg with good control       Lab Results  Component Value Date   CHOL 123 11/18/2014   HDL 38.00* 11/18/2014   LDLCALC 65 11/18/2014   TRIG 102.0 11/18/2014  CHOLHDL 3 11/18/2014                      Has had erectile dysfunction for quite sometime. Also had decreased libido previously and was found to have hypogonadism.  Symptoms are controlled with AndroGel  He was instructed on using AndroGel on his shoulders  with 2 pumps on each side. He reveals that he is applying this about 3 days a week instead of every day and half of his applications are on his abdomen. His level was relatively high but not clear if the level was done the day after he applied it   Lab Results  Component Value Date   TESTOSTERONE 797 03/22/2015             Has history of Numbness for about one year along with tingling and burning in feet which is better   HYPERTENSION: This is followed by PCP and is on 100 mg of losartan along with Maxide, amlodipine and metoprolol    LABS:  Office Visit on 04/15/2015  Component Date Value Ref Range Status  . Hemoglobin A1C 04/15/2015 6.2   Final    Physical Examination:  BP 124/70 mmHg  Pulse 80  Temp(Src) 98.2 F (36.8 C)  Resp 16  Ht 6\' 1"  (1.854 m)  Wt 249 lb 3.2 oz (113.036 kg)  BMI 32.88 kg/m2  SpO2 96%   no pedal edema    ASSESSMENT/PLAN:   Diabetes type 2, with obesity  His home blood sugars are well controlled  Weight is stable usingy along with his metformin and Amaryl A1c has improved to 6.2 and he was continue the same regimen including Trulicity  Hypertension: good control   HYPOGONADISM: He has a relatively high testosterone level and will reduce his AndroGel to 1 pump daily and also discussed the need to take it regularly everyday   He will need a follow-up level on the next visit   Patient Instructions  Check blood sugars on waking up .. 2 .. times a week Also check blood sugars about 2 hours after a meal and do this after different meals by rotation  Recommended blood sugar levels on waking up is 90-130 and about 2 hours after meal is 140-180 Please bring blood sugar monitor to each visit.  Use only 1 pump Androgel once daily only    Jalynne Persico 04/17/2015, 9:41 PM   Note: This office note was prepared with Estate agent. Any transcriptional errors that result from this process are unintentional.

## 2015-05-23 ENCOUNTER — Other Ambulatory Visit: Payer: Self-pay | Admitting: Endocrinology

## 2015-05-27 ENCOUNTER — Encounter: Payer: Self-pay | Admitting: Cardiovascular Disease

## 2015-05-30 ENCOUNTER — Telehealth: Payer: Self-pay | Admitting: *Deleted

## 2015-05-30 DIAGNOSIS — I739 Peripheral vascular disease, unspecified: Secondary | ICD-10-CM

## 2015-05-30 NOTE — Telephone Encounter (Signed)
Pt is returning your call

## 2015-05-30 NOTE — Telephone Encounter (Signed)
Spoke with patient.  Order placed for LE arterial dopplers per Dr Kennon Holter recommendation on previous lower extremity doppler results.

## 2015-06-01 ENCOUNTER — Other Ambulatory Visit: Payer: Self-pay | Admitting: Cardiovascular Disease

## 2015-06-01 DIAGNOSIS — I739 Peripheral vascular disease, unspecified: Secondary | ICD-10-CM

## 2015-06-06 ENCOUNTER — Telehealth: Payer: Self-pay | Admitting: Cardiology

## 2015-06-06 ENCOUNTER — Ambulatory Visit (HOSPITAL_COMMUNITY)
Admission: RE | Admit: 2015-06-06 | Discharge: 2015-06-06 | Disposition: A | Discharge status: Home / Self Care | Payer: Medicare Other | Source: Ambulatory Visit | Attending: Cardiovascular Disease | Admitting: Cardiovascular Disease

## 2015-06-06 DIAGNOSIS — J449 Chronic obstructive pulmonary disease, unspecified: Secondary | ICD-10-CM | POA: Insufficient documentation

## 2015-06-06 DIAGNOSIS — I10 Essential (primary) hypertension: Secondary | ICD-10-CM | POA: Diagnosis not present

## 2015-06-06 DIAGNOSIS — I739 Peripheral vascular disease, unspecified: Secondary | ICD-10-CM | POA: Diagnosis not present

## 2015-06-06 DIAGNOSIS — E119 Type 2 diabetes mellitus without complications: Secondary | ICD-10-CM | POA: Diagnosis not present

## 2015-06-06 DIAGNOSIS — Z95828 Presence of other vascular implants and grafts: Secondary | ICD-10-CM | POA: Diagnosis not present

## 2015-06-06 DIAGNOSIS — Z87891 Personal history of nicotine dependence: Secondary | ICD-10-CM | POA: Insufficient documentation

## 2015-06-06 DIAGNOSIS — E785 Hyperlipidemia, unspecified: Secondary | ICD-10-CM | POA: Insufficient documentation

## 2015-06-06 DIAGNOSIS — I2510 Atherosclerotic heart disease of native coronary artery without angina pectoris: Secondary | ICD-10-CM | POA: Insufficient documentation

## 2015-06-06 NOTE — Telephone Encounter (Signed)
Please arrange follow-up with Dr. Meda Coffee.

## 2015-06-06 NOTE — Telephone Encounter (Signed)
New Message      Pt calling wanting to know which cardiologist Dr. Ron Parker wants him to see after he retires. Please call back and advise.

## 2015-06-06 NOTE — Telephone Encounter (Signed)
**Note De-Identified Valeen Borys Obfuscation** The pt is requesting that Dr Ron Parker recommend which cardiologist he should follow up with after Dr Gweneth Dimitri. Please advise.

## 2015-06-08 NOTE — Telephone Encounter (Signed)
The pt is due to f/u in May, 2017. Please arrange. Thanks.

## 2015-06-10 NOTE — Telephone Encounter (Signed)
Spoke to pt and informed him that Dr. Ron Parker would like him to f/u w/ Dr. Meda Coffee, pt is aware and states this is ok. Recall put in for f/u w/ Dr. Meda Coffee in May of 2017.

## 2015-06-17 ENCOUNTER — Other Ambulatory Visit: Payer: Self-pay | Admitting: Internal Medicine

## 2015-06-26 ENCOUNTER — Other Ambulatory Visit: Payer: Self-pay | Admitting: Endocrinology

## 2015-06-28 ENCOUNTER — Telehealth: Payer: Self-pay | Admitting: Internal Medicine

## 2015-06-28 NOTE — Telephone Encounter (Signed)
Please forward to endocrinology, Dr. Dwyane Dee

## 2015-06-28 NOTE — Telephone Encounter (Signed)
Pharmacy: [facility]  Reason for call: pt needing refill on androgel. He is out. Pharmacy told him to call the office.

## 2015-06-28 NOTE — Telephone Encounter (Signed)
Pt is requesting refill on Androgel.  Last OV: 03/22/2015 Last Fill: 08/26/2014 #225g and 3 RF   Please advise.

## 2015-07-03 ENCOUNTER — Other Ambulatory Visit: Payer: Self-pay | Admitting: Internal Medicine

## 2015-07-06 ENCOUNTER — Ambulatory Visit (INDEPENDENT_AMBULATORY_CARE_PROVIDER_SITE_OTHER): Payer: Medicare Other

## 2015-07-06 DIAGNOSIS — Z23 Encounter for immunization: Secondary | ICD-10-CM | POA: Diagnosis not present

## 2015-07-12 ENCOUNTER — Telehealth: Payer: Self-pay | Admitting: Endocrinology

## 2015-07-12 ENCOUNTER — Other Ambulatory Visit: Payer: Self-pay | Admitting: Endocrinology

## 2015-07-12 NOTE — Telephone Encounter (Signed)
Please set him up on my nurse schedule, I'll show him.

## 2015-07-12 NOTE — Telephone Encounter (Signed)
Pt does not want to wait until she returns is there anyone else we can get him with

## 2015-07-12 NOTE — Telephone Encounter (Signed)
Pt needs instruction on how to mix the byduren pen, who is he to make the appt with

## 2015-07-12 NOTE — Telephone Encounter (Signed)
He needs to schedule an appointment with Vaughan Basta for instructions

## 2015-07-13 ENCOUNTER — Institutional Professional Consult (permissible substitution): Payer: Medicare Other

## 2015-07-14 ENCOUNTER — Ambulatory Visit: Payer: Medicare Other | Admitting: *Deleted

## 2015-07-28 ENCOUNTER — Telehealth: Payer: Self-pay | Admitting: Internal Medicine

## 2015-07-28 NOTE — Telephone Encounter (Signed)
Refills per Dr. Dwyane Dee

## 2015-07-28 NOTE — Telephone Encounter (Signed)
Please refill.

## 2015-07-28 NOTE — Telephone Encounter (Signed)
Wilfrid Lund, CMA at 07/28/2015 12:46 PM     Status: Signed       Expand All Collapse All   Pt is requesting refill on Testosterone.  Last OV: 03/22/2015  Last Fill: 08/26/2014 #225g and 3RF   Please advise.

## 2015-07-28 NOTE — Telephone Encounter (Signed)
Caller name: Self   Can be reached: [phone]  Pharmacy: [facility], [place] [phone] (Phone) [phone] (Fax)         Reason for call: Requesting refill on  Testosterone 20.25 MG/ACT (1.62%) GEL [id number]

## 2015-07-28 NOTE — Telephone Encounter (Signed)
Notified patient of below./DLS

## 2015-07-29 ENCOUNTER — Telehealth: Payer: Self-pay | Admitting: Endocrinology

## 2015-07-29 ENCOUNTER — Other Ambulatory Visit: Payer: Self-pay | Admitting: *Deleted

## 2015-07-29 MED ORDER — TESTOSTERONE 20.25 MG/ACT (1.62%) TD GEL: 4.0000 "application " | Freq: Every day | TRANSDERMAL | Status: DC

## 2015-07-29 NOTE — Telephone Encounter (Signed)
Pt needs refill [facility] androgel pump

## 2015-08-08 ENCOUNTER — Other Ambulatory Visit: Payer: Self-pay | Admitting: Internal Medicine

## 2015-08-16 ENCOUNTER — Other Ambulatory Visit: Payer: Self-pay | Admitting: *Deleted

## 2015-08-16 ENCOUNTER — Ambulatory Visit (INDEPENDENT_AMBULATORY_CARE_PROVIDER_SITE_OTHER): Payer: Medicare Other | Admitting: Endocrinology

## 2015-08-16 ENCOUNTER — Encounter: Payer: Self-pay | Admitting: Endocrinology

## 2015-08-16 ENCOUNTER — Other Ambulatory Visit (INDEPENDENT_AMBULATORY_CARE_PROVIDER_SITE_OTHER): Payer: Medicare Other

## 2015-08-16 VITALS — BP 122/76 | HR 67 | Temp 98.5°F | Resp 14 | Ht 73.0 in | Wt 254.6 lb

## 2015-08-16 DIAGNOSIS — N182 Chronic kidney disease, stage 2 (mild): Secondary | ICD-10-CM | POA: Diagnosis not present

## 2015-08-16 DIAGNOSIS — E119 Type 2 diabetes mellitus without complications: Secondary | ICD-10-CM

## 2015-08-16 DIAGNOSIS — E291 Testicular hypofunction: Secondary | ICD-10-CM | POA: Diagnosis not present

## 2015-08-16 DIAGNOSIS — R5382 Chronic fatigue, unspecified: Secondary | ICD-10-CM | POA: Diagnosis not present

## 2015-08-16 DIAGNOSIS — IMO0002 Reserved for concepts with insufficient information to code with codable children: Secondary | ICD-10-CM

## 2015-08-16 DIAGNOSIS — I2510 Atherosclerotic heart disease of native coronary artery without angina pectoris: Secondary | ICD-10-CM | POA: Diagnosis not present

## 2015-08-16 DIAGNOSIS — E1165 Type 2 diabetes mellitus with hyperglycemia: Secondary | ICD-10-CM

## 2015-08-16 LAB — COMPREHENSIVE METABOLIC PANEL
ALBUMIN: 3.7 g/dL (ref 3.5–5.2)
ALT: 22 U/L (ref 0–53)
AST: 17 U/L (ref 0–37)
Alkaline Phosphatase: 56 U/L (ref 39–117)
BUN: 19 mg/dL (ref 6–23)
CALCIUM: 9.6 mg/dL (ref 8.4–10.5)
CHLORIDE: 108 meq/L (ref 96–112)
CO2: 23 meq/L (ref 19–32)
Creatinine, Ser: 1.42 mg/dL (ref 0.40–1.50)
GFR: 62.1 mL/min (ref >60.00)
Glucose, Bld: 132 mg/dL — ABNORMAL HIGH (ref 70–99)
POTASSIUM: 3.8 meq/L (ref 3.5–5.1)
SODIUM: 142 meq/L (ref 135–145)
Total Bilirubin: 0.5 mg/dL (ref 0.2–1.2)
Total Protein: 6.7 g/dL (ref 6.0–8.3)

## 2015-08-16 LAB — MICROALBUMIN / CREATININE URINE RATIO
Creatinine,U: 121 mg/dL
MICROALB/CREAT RATIO: 0.7 mg/g (ref 0.0–30.0)
Microalb, Ur: 0.9 mg/dL (ref 0.0–1.9)

## 2015-08-16 LAB — TSH: TSH: 2.25 u[IU]/mL (ref 0.35–4.50)

## 2015-08-16 MED ORDER — DULAGLUTIDE 1.5 MG/0.5ML ~~LOC~~ SOAJ: SUBCUTANEOUS | Status: DC

## 2015-08-16 NOTE — Patient Instructions (Signed)
Glimeperide 1/2 twice daily Metformin with dinner only  Check blood sugars on waking up 3 times a week Also check blood sugars about 2 hours after a meal and do this after different meals by rotation  Recommended blood sugar levels on waking up is 90-130 and about 2 hours after meal is 130-160  Please bring your blood sugar monitor to each visit, thank you

## 2015-08-16 NOTE — Progress Notes (Signed)
Patient ID: Jonathan [name]., male   DOB: 1935/11/21, 79 y.o.   MRN: [id number]    Reason for Appointment:  Follow-up  History of Present Illness:          Diagnosis: Type 2 diabetes mellitus, date of diagnosis:  2009      Past history: He is not clear what his diabetes was diagnosed but according to hospital records it may have been in 2009. Most likely he was placed on metformin initially and at some point Amaryl added. In 2013 it was also given Januvia presumably to improve his control. However appears that his A1c has been consistently over 7% Janumet was started instead of metformin and Januvia separately in 2013   Recent history:  He was started on Trulicity in [id number] instead of Victoza because excessive bruising on his abdomen  However in 01/2015 he was switched to Bydureon because of insurance noncoverage of his Trulicity Currently he is complaining about excessive pain with the injection and does not want to continue Also his A1c appears to be going up, previously 6.2 and now 6.9  Now taking metformin 1 g  a day in the mornings, dose was reduced previously because of relatively high creatinine Also taking Amaryl 4 mg in the mornings   He has done well with overall compliance but has not been able to lose weight despite exercising regularly     Oral hypoglycemic drugs the patient is taking are: Metformin 1000 once a day, Amaryl 4 mg daily in am     Side effects from medications have been: None  Glucose monitoring:  done  Once or twice a day         Glucometer:  One Touch Blood Glucose readings  by   Mean values apply above for all meters except median for One Touch  PRE-MEAL Fasting Lunch Dinner Bedtime Overall  Glucose range:  112-176   111   91, 109   137-181    Mean/median:  145      142     Hypoglycemia:   none     Glycemic control:  Lab Results  Component Value Date   HGBA1C 6.2 04/15/2015   HGBA1C 6.9* 12/13/2014   HGBA1C 6.6* 07/05/2014   Lab  Results  Component Value Date   MICROALBUR 0.7 07/08/2014   LDLCALC 65 11/18/2014   CREATININE 1.67* 01/07/2015    Self-care:      Meals: 3 meals per day. eating egg/meat breading for breakfast   Exercise: Walks 3-4/7 for 1 hr at gym        Dietician visit: Most recent: Unknown.               Retinal exam: Most recent: 3/15 .    Weight history:  Wt Readings from Last 3 Encounters:  08/16/15 254 lb 9.6 oz (115.486 kg)  04/15/15 249 lb 3.2 oz (113.036 kg)  03/22/15 247 lb 6 oz (112.209 kg)      Medication List       This list is accurate as of: 08/16/15  9:27 AM.  Always use your most recent med list.               amLODipine 10 MG tablet  Commonly known as:  NORVASC  TAKE 1 TABLET DAILY     aspirin 81 MG tablet  Take 81 mg by mouth daily.     BYDUREON 2 MG Pen  Generic drug:  Exenatide ER  INJECT CONTENTS OF  1 PEN ONCE A WEEK     clopidogrel 75 MG tablet  Commonly known as:  PLAVIX  Take 1 tablet (75 mg total) by mouth daily.     FREESTYLE LITE test strip  Generic drug:  glucose blood  USE AS INSTRUCTED TO CHECK BLOOD SUGAR TWICE A DAY     glimepiride 4 MG tablet  Commonly known as:  AMARYL  TAKE ONE AND ONE-HALF TABLETS DAILY     losartan 100 MG tablet  Commonly known as:  COZAAR  Take 1 tablet (100 mg total) by mouth daily.     metFORMIN 1000 MG tablet  Commonly known as:  GLUCOPHAGE  Take 1,000 mg by mouth daily.     metoprolol succinate 100 MG 24 hr tablet  Commonly known as:  TOPROL XL  Take 1 tablet (100 mg total) by mouth daily.     multivitamin with minerals Tabs tablet  Take 1 tablet by mouth daily.     simvastatin 20 MG tablet  Commonly known as:  ZOCOR  Take 1 tablet (20 mg total) by mouth at bedtime.     SPIRIVA HANDIHALER 18 MCG inhalation capsule  Generic drug:  tiotropium  PLACE 1 CAPSULE INTO INHALER AND INHALE THE CONTENTS OF 1 CAPSULE DAILY     SYMBICORT 160-4.5 MCG/ACT inhaler  Generic drug:  budesonide-formoterol  USE  2 INHALATIONS TWICE A DAY     Testosterone 20.25 MG/ACT (1.62%) Gel  Place 4 application onto the skin daily.     triamterene-hydrochlorothiazide 37.5-25 MG tablet  Commonly known as:  MAXZIDE-25  Take 1 tablet by mouth daily.     VENTOLIN HFA 108 (90 BASE) MCG/ACT inhaler  Generic drug:  albuterol  Inhale 2 puffs into the lungs every 6 (six) hours as needed for wheezing or shortness of breath.        Allergies: No Known Allergies  Past Medical History  Diagnosis Date  . CAD (coronary artery disease)     Two RCA stents remotely / 3rd RCA stent 2006  . PVD (peripheral vascular disease) ([place])     s/p stents at LE 2009, Dr Einar Gip  . Colon polyps     s/p several Cscopes.  . Diabetes mellitus     dx aprox 2009  . Hyperlipidemia     dx in 90s  . Hypertension     dx in the 90  . ED (erectile dysfunction)     has a vacumm device  . Hypogonadism male   . Shortness of breath     O2 Sat dropped to 82% walking on the treadmill, September, 2012  . COPD (chronic obstructive pulmonary disease) (HCC)     on O2, nocturnal  . Ejection fraction     Past Surgical History  Procedure Laterality Date  . Appendectomy    . Tonsillectomy      Family History  Problem Relation Age of Onset  . Heart disease Father   . Hypertension Father   . Stroke Father   . Diabetes Other     GM, nephews, many family members  . Hyperlipidemia Other     ?  . Colon cancer Neg Hx   . Prostate cancer Neg Hx   . Diabetes Paternal Aunt   . Diabetes Maternal Grandmother     Social History:  reports that he quit smoking about 38 years ago. His smoking use included Cigarettes. He has a 60 pack-year smoking history. He has never used smokeless tobacco. He reports that he  drinks alcohol. He reports that he does not use illicit drugs.    Review of Systems       Lipids: He has been treated with simvastatin 20 mg with good control       Lab Results  Component Value Date   CHOL 123 11/18/2014   HDL  38.00* 11/18/2014   LDLCALC 65 11/18/2014   TRIG 102.0 11/18/2014   CHOLHDL 3 11/18/2014                      Has had erectile dysfunction for quite sometime.  Also had decreased libido previously and was found to have hypogonadism, probably in 2011. Original evaluation is not available.  He did have a slightly low free testosterone level in 2012 on treatment   Symptoms are controlled with AndroGel but he sometimes does get fatigued in the evenings  On his last visit his testosterone level was almost 800 and he was told to apply only 1 pump on each arm daily, previously was also applying it 3 days a week Recent labs not available   Lab Results  Component Value Date   TESTOSTERONE 797 03/22/2015             Has history of Numbness for about one year along with tingling and burning in feet which is better   HYPERTENSION: This is followed by PCP and is on 100 mg of losartan along with Maxide, amlodipine and metoprolol    LABS:  No visits with results within 1 Week(s) from this visit. Latest known visit with results is:  Office Visit on 04/15/2015  Component Date Value Ref Range Status  . Hemoglobin A1C 04/15/2015 6.2   Final    Physical Examination:  BP 122/76 mmHg  Pulse 67  Temp(Src) 98.5 F (36.9 C)  Resp 14  Ht 6\' 1"  (1.854 m)  Wt 254 lb 9.6 oz (115.486 kg)  BMI 33.60 kg/m2  SpO2 91%   no pedal edema    ASSESSMENT/PLAN:   Diabetes type 2, with obesity  His home blood sugars are not as well controlled probably because of switching to Bydureon He is continuing his exercise regimen and usually watching his diet Weight is stable usingy along with his metformin and Amaryl A1c has gone up to 6.9, previously 6.2 Since the patient desires to go back on Trulicity will get this prior authorized, this is also improve his control and hopefully better weight control too Meanwhile he will have his renal function rechecked today and adjust the dose of metformin  accordingly Until then he will take his metformin in the evening instead of morning also split the Amaryl to half tablet twice a day Discussed needing to check more readings after meals especially in the evenings  Hypertension: good control   HYPOGONADISM: He has been using AndroGel 1 pump daily Will check his level today and decide on the dosage Will also check his prolactin as no report is available on file  RENAL dysfunction: He has not had a follow-up with his PCP for this since April and will check his labs today.  Does not have diabetic nephropathy and may need nephrology consultation if creatinine still significantly abnormal   Patient Instructions  Glimeperide 1/2 twice daily Metformin with dinner only  Check blood sugars on waking up 3 times a week Also check blood sugars about 2 hours after a meal and do this after different meals by rotation  Recommended blood sugar levels on waking up  is 90-130 and about 2 hours after meal is 130-160  Please bring your blood sugar monitor to each visit, thank you   Counseling time on subjects discussed above is over 50% of today's 25 minute visit   Nashawn Hillock 08/16/2015, 9:27 AM   Note: This office note was prepared with Estate agent. Any transcriptional errors that result from this process are unintentional.

## 2015-08-17 ENCOUNTER — Other Ambulatory Visit: Payer: Medicare Other

## 2015-08-17 DIAGNOSIS — E291 Testicular hypofunction: Secondary | ICD-10-CM | POA: Diagnosis not present

## 2015-08-18 LAB — TESTOSTERONE: Testosterone: 657 ng/dL (ref 300–890)

## 2015-08-19 ENCOUNTER — Telehealth: Payer: Self-pay | Admitting: Endocrinology

## 2015-08-19 ENCOUNTER — Other Ambulatory Visit: Payer: Self-pay | Admitting: *Deleted

## 2015-08-19 MED ORDER — ALBIGLUTIDE 50 MG ~~LOC~~ PEN: PEN_INJECTOR | SUBCUTANEOUS | Status: DC

## 2015-08-19 NOTE — Telephone Encounter (Signed)
rx sent

## 2015-08-19 NOTE — Telephone Encounter (Signed)
[facility] pt aware to call us to get on the nurse schedule so Suanne Marker can show him how to administer Tanzeum

## 2015-08-25 ENCOUNTER — Institutional Professional Consult (permissible substitution): Payer: Medicare Other

## 2015-08-26 ENCOUNTER — Other Ambulatory Visit: Payer: Self-pay | Admitting: Internal Medicine

## 2015-08-30 ENCOUNTER — Institutional Professional Consult (permissible substitution): Payer: Medicare Other

## 2015-08-30 ENCOUNTER — Other Ambulatory Visit: Payer: Self-pay | Admitting: Endocrinology

## 2015-09-02 ENCOUNTER — Telehealth: Payer: Self-pay | Admitting: Endocrinology

## 2015-09-02 ENCOUNTER — Other Ambulatory Visit: Payer: Self-pay | Admitting: *Deleted

## 2015-09-02 MED ORDER — FREESTYLE LANCETS MISC: Status: DC

## 2015-09-02 NOTE — Telephone Encounter (Signed)
rx sent

## 2015-09-02 NOTE — Telephone Encounter (Signed)
Patient called stating that he would like a refill on his Rx  Rx: Lancets   Pharmacy: [facility]   Thank you

## 2015-09-09 ENCOUNTER — Other Ambulatory Visit: Payer: Self-pay | Admitting: Internal Medicine

## 2015-09-22 ENCOUNTER — Ambulatory Visit (INDEPENDENT_AMBULATORY_CARE_PROVIDER_SITE_OTHER): Payer: Medicare Other | Admitting: Internal Medicine

## 2015-09-22 ENCOUNTER — Encounter: Payer: Self-pay | Admitting: Internal Medicine

## 2015-09-22 VITALS — BP 122/64 | HR 72 | Temp 98.2°F | Ht 73.0 in | Wt 250.2 lb

## 2015-09-22 DIAGNOSIS — E1159 Type 2 diabetes mellitus with other circulatory complications: Secondary | ICD-10-CM

## 2015-09-22 DIAGNOSIS — I10 Essential (primary) hypertension: Secondary | ICD-10-CM | POA: Diagnosis not present

## 2015-09-22 DIAGNOSIS — I2510 Atherosclerotic heart disease of native coronary artery without angina pectoris: Secondary | ICD-10-CM | POA: Diagnosis not present

## 2015-09-22 NOTE — Progress Notes (Signed)
Pre visit review using our clinic review tool, if applicable. No additional management support is needed unless otherwise documented below in the visit note. 

## 2015-09-22 NOTE — Patient Instructions (Signed)
BEFORE YOU LEAVE THE OFFICE: GO TO THE FRONT DESK  Schedule a complete physical exam to be done by 03-2016   Please be fasting      Diabetes and Foot Care Diabetes may cause you to have problems because of poor blood supply (circulation) to your feet and legs. This may cause the skin on your feet to become thinner, break easier, and heal more slowly. Your skin may become dry, and the skin may peel and crack. You may also have nerve damage in your legs and feet causing decreased feeling in them. You may not notice minor injuries to your feet that could lead to infections or more serious problems. Taking care of your feet is one of the most important things you can do for yourself.  HOME CARE INSTRUCTIONS  Wear shoes at all times, even in the house. Do not go barefoot. Bare feet are easily injured.  Check your feet daily for blisters, cuts, and redness. If you cannot see the bottom of your feet, use a mirror or ask someone for help.  Wash your feet with warm water (do not use hot water) and mild soap. Then pat your feet and the areas between your toes until they are completely dry. Do not soak your feet as this can dry your skin.  Apply a moisturizing lotion or petroleum jelly (that does not contain alcohol and is unscented) to the skin on your feet and to dry, brittle toenails. Do not apply lotion between your toes.  Trim your toenails straight across. Do not dig under them or around the cuticle. File the edges of your nails with an emery board or nail file.  Do not cut corns or calluses or try to remove them with medicine.  Wear clean socks or stockings every day. Make sure they are not too tight. Do not wear knee-high stockings since they may decrease blood flow to your legs.  Wear shoes that fit properly and have enough cushioning. To break in new shoes, wear them for just a few hours a day. This prevents you from injuring your feet. Always look in your shoes before you put them on to be  sure there are no objects inside.  Do not cross your legs. This may decrease the blood flow to your feet.  If you find a minor scrape, cut, or break in the skin on your feet, keep it and the skin around it clean and dry. These areas may be cleansed with mild soap and water. Do not cleanse the area with peroxide, alcohol, or iodine.  When you remove an adhesive bandage, be sure not to damage the skin around it.  If you have a wound, look at it several times a day to make sure it is healing.  Do not use heating pads or hot water bottles. They may burn your skin. If you have lost feeling in your feet or legs, you may not know it is happening until it is too late.  Make sure your health care provider performs a complete foot exam at least annually or more often if you have foot problems. Report any cuts, sores, or bruises to your health care provider immediately. SEEK MEDICAL CARE IF:   You have an injury that is not healing.  You have cuts or breaks in the skin.  You have an ingrown nail.  You notice redness on your legs or feet.  You feel burning or tingling in your legs or feet.  You have pain  or cramps in your legs and feet.  Your legs or feet are numb.  Your feet always feel cold. SEEK IMMEDIATE MEDICAL CARE IF:   There is increasing redness, swelling, or pain in or around a wound.  There is a red line that goes up your leg.  Pus is coming from a wound.  You develop a fever or as directed by your health care provider.  You notice a bad smell coming from an ulcer or wound.   This information is not intended to replace advice given to you by your health care provider. Make sure you discuss any questions you have with your health care provider.   Document Released: 08/31/2000 Document Revised: 05/06/2013 Document Reviewed: 02/10/2013 Elsevier Interactive Patient Education [employer].

## 2015-09-22 NOTE — Progress Notes (Signed)
Subjective:    Patient ID: Jonathan Leather., male    DOB: 14-Nov-1935, 80 y.o.   MRN: [id number]  DOS:  09/22/2015 Type of visit - description : Routine visit Interval history: Good compliance w/  medications, he feels well. Ambulatory BPs always within normal. He remains physically active Diabetes: CBGs run from 90-125. When asked about low blood sugar he states that from time to time he feels shaky, eats something sweet and feels better. Has not check CBGs during symptoms. He is concerned about a number of medication he takes.   Review of Systems No chest pain or difficulty breathing No nausea, vomiting, diarrhea. Occasionally feet numbness, denies any redness or skin problems.  Past Medical History  Diagnosis Date  . CAD (coronary artery disease)     Two RCA stents remotely / 3rd RCA stent 2006  . PVD (peripheral vascular disease) ([place])     s/p stents at LE 2009, Dr Einar Gip  . Colon polyps     s/p several Cscopes.  . Diabetes mellitus     dx aprox 2009  . Hyperlipidemia     dx in 90s  . Hypertension     dx in the 90  . ED (erectile dysfunction)     has a vacumm device  . Hypogonadism male   . Shortness of breath     O2 Sat dropped to 82% walking on the treadmill, September, 2012  . COPD (chronic obstructive pulmonary disease) (HCC)     on O2, nocturnal  . Ejection fraction     Past Surgical History  Procedure Laterality Date  . Appendectomy    . Tonsillectomy      Social History   Social History  . Marital Status: Married    Spouse Name: N/A  . Number of Children: 6  . Years of Education: N/A   Occupational History  . 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 Smoking status: Former Smoker -- 2.00 packs/day for 30 years    Types: Cigarettes    Quit date: 06/17/1977  . Smokeless tobacco: Never Used     Comment: 2 ppd, quit 1987  . Alcohol Use: Yes     Comment: socially   . Drug Use: No  . Sexual Activity: Not on file    Other Topics Concern  . Not on file   Social History Narrative   Has 2 step sons, and 4 children (lost 1 son)                        Medication List       This list is accurate as of: 09/22/15  9:05 PM.  Always use your most recent med list.               Albiglutide 50 MG Pen  Commonly known as:  TANZEUM  Inject the contents of one pen once per week     amLODipine 10 MG tablet  Commonly known as:  NORVASC  TAKE 1 TABLET DAILY     aspirin 81 MG tablet  Take 81 mg by mouth daily.     clopidogrel 75 MG tablet  Commonly known as:  PLAVIX  Take 1 tablet (75 mg total) by mouth daily.     freestyle lancets  Use as instructed to check blood sugar twice a day dx code E11.65     FREESTYLE LITE test strip  Generic drug:  glucose blood  USE AS INSTRUCTED TO CHECK BLOOD SUGAR TWICE A DAY     glimepiride 4 MG tablet  Commonly known as:  AMARYL  TAKE ONE AND ONE-HALF TABLETS DAILY     losartan 100 MG tablet  Commonly known as:  COZAAR  Take 1 tablet (100 mg total) by mouth daily.     metFORMIN 1000 MG tablet  Commonly known as:  GLUCOPHAGE  Take 1,000 mg by mouth daily.     metoprolol succinate 100 MG 24 hr tablet  Commonly known as:  TOPROL XL  Take 1 tablet (100 mg total) by mouth daily.     multivitamin with minerals Tabs tablet  Take 1 tablet by mouth daily.     simvastatin 20 MG tablet  Commonly known as:  ZOCOR  Take 1 tablet (20 mg total) by mouth at bedtime.     SPIRIVA HANDIHALER 18 MCG inhalation capsule  Generic drug:  tiotropium  PLACE 1 CAPSULE INTO INHALER AND INHALE THE CONTENTS OF 1 CAPSULE DAILY     SYMBICORT 160-4.5 MCG/ACT inhaler  Generic drug:  budesonide-formoterol  USE 2 INHALATIONS TWICE A DAY     Testosterone 20.25 MG/ACT (1.62%) Gel  Place 4 application onto the skin daily.     triamterene-hydrochlorothiazide 37.5-25 MG tablet  Commonly known as:  MAXZIDE-25  Take 1 tablet by mouth daily.     VENTOLIN HFA 108 (90 Base)  MCG/ACT inhaler  Generic drug:  albuterol  Inhale 2 puffs into the lungs every 6 (six) hours as needed for wheezing or shortness of breath.           Objective:   Physical Exam BP 122/64 mmHg  Pulse 72  Temp(Src) 98.2 F (36.8 C) (Oral)  Ht 6\' 1"  (1.854 m)  Wt 250 lb 4 oz (113.513 kg)  BMI 33.02 kg/m2  SpO2 92% General:   Well developed, well nourished . NAD.  HEENT:  Normocephalic . Face symmetric, atraumatic Lungs:  CTA B Normal respiratory effort, no intercostal retractions, no accessory muscle use. Heart: RRR,  no murmur.  No pretibial edema bilaterally  Skin: Not pale. Not jaundice Neurologic:  alert & oriented X3.  Speech normal, gait appropriate for age and unassisted Psych--  Cognition and judgment appear intact.  Cooperative with normal attention span and concentration.  Behavior appropriate. No anxious or depressed appearing.      Assessment & Plan:   Assessment DM dx [id number], complicated by CAD, PVD, mild neuropathy. Sees Dr. Dwyane Dee HTN Hyperlipidemia COPD, nocturnal oxygen CV: --CAD, Dr. Ron Parker -Peripheral vascular disease, stents lower extremity 2009  hypogonadism. Sees Dr. Dwyane Dee ED  PLAN DM: Under the care of Dr. Dwyane Dee, occasionally CBGs in the 90 has symptoms of low CBGs. Will send a message to Dr. Dwyane Dee, discontinue glimepiride?Marland Kitchen Also has mild neuropathy, discussed feet care, declined to take any other medications such as gabapentin HTN: Well-controlled, last BMP satisfactory CAD: Asymptomatic, plan is to control CV risk factors RTC 03-2016, CPX

## 2015-09-29 ENCOUNTER — Encounter: Payer: Self-pay | Admitting: Podiatry

## 2015-09-29 ENCOUNTER — Telehealth: Payer: Self-pay | Admitting: Internal Medicine

## 2015-09-29 ENCOUNTER — Ambulatory Visit (INDEPENDENT_AMBULATORY_CARE_PROVIDER_SITE_OTHER): Payer: Medicare Other | Admitting: Podiatry

## 2015-09-29 VITALS — BP 154/84 | HR 75

## 2015-09-29 DIAGNOSIS — M79606 Pain in leg, unspecified: Secondary | ICD-10-CM | POA: Diagnosis not present

## 2015-09-29 DIAGNOSIS — B351 Tinea unguium: Secondary | ICD-10-CM | POA: Diagnosis not present

## 2015-09-29 NOTE — Telephone Encounter (Signed)
I communicated with endocrinology, okay to decrease glimepiride dose. Patient states CBGs in the morning in the 90s, in the afternoon in the 120s.  Will decreased glimepiride 4 mg 1.5 tablets in the morning to only one tablet in the morning. Watch blood sugars.

## 2015-09-29 NOTE — Progress Notes (Signed)
Subjective:  80 year old Diabetic male presents requesting painful nails trimmed.  Right great toe nail is loose and dark. 5th toe nail on right is deformed and painful. Also has a painful corn on 5th digit right.  Objective:  Thick dystrophic nails x 10.  Loose dystropnic right hallucal nail. Contracted lesser digits bilateral with painful corn on 5th digit on right foot  Cavus type foot.  All pedal pulses palpable. No skin lesion or acute problems noted.   Assessment: Onychomycosis x 10.  Traumatized and deformedd right hallucal nail. Hammer toe deformities 2-5 bilateral.  Painful digital corns 5th right.   Treatment:  Debrided 5th digital corn and all nails x 10.  Return in 3 months or as needed.

## 2015-09-29 NOTE — Patient Instructions (Signed)
Seen for hypertrophic nails, and corns. Right hallucal nail traumatized and deformed. All nails and corns debrided. May benefit from diabetic shoes. Return in 3 months or as needed.

## 2015-09-29 NOTE — Telephone Encounter (Signed)
Called, pt not at home, will try to contact him tomorrow

## 2015-09-30 ENCOUNTER — Telehealth: Payer: Self-pay | Admitting: *Deleted

## 2015-09-30 NOTE — Telephone Encounter (Signed)
Received fax from [facility] requesting OV Notes for patient with Diabetic information; forwarded to provider/SLS 01/13

## 2015-10-03 ENCOUNTER — Ambulatory Visit: Payer: Medicare Other | Admitting: Podiatry

## 2015-10-24 NOTE — Telephone Encounter (Signed)
Completed form faxed, copy to scan, copy to billing/SLS 01/13

## 2015-11-06 ENCOUNTER — Other Ambulatory Visit: Payer: Self-pay | Admitting: Internal Medicine

## 2015-11-15 ENCOUNTER — Encounter: Payer: Self-pay | Admitting: Endocrinology

## 2015-11-15 ENCOUNTER — Ambulatory Visit (INDEPENDENT_AMBULATORY_CARE_PROVIDER_SITE_OTHER): Payer: Medicare Other | Admitting: Endocrinology

## 2015-11-15 VITALS — BP 124/76 | HR 71 | Temp 97.7°F | Resp 16 | Ht 73.0 in | Wt 251.4 lb

## 2015-11-15 DIAGNOSIS — I2510 Atherosclerotic heart disease of native coronary artery without angina pectoris: Secondary | ICD-10-CM | POA: Diagnosis not present

## 2015-11-15 DIAGNOSIS — E291 Testicular hypofunction: Secondary | ICD-10-CM

## 2015-11-15 DIAGNOSIS — E119 Type 2 diabetes mellitus without complications: Secondary | ICD-10-CM | POA: Diagnosis not present

## 2015-11-15 DIAGNOSIS — E785 Hyperlipidemia, unspecified: Secondary | ICD-10-CM | POA: Diagnosis not present

## 2015-11-15 LAB — COMPREHENSIVE METABOLIC PANEL
ALK PHOS: 57 U/L (ref 39–117)
ALT: 22 U/L (ref 0–53)
AST: 18 U/L (ref 0–37)
Albumin: 4.2 g/dL (ref 3.5–5.2)
BUN: 18 mg/dL (ref 6–23)
CALCIUM: 10 mg/dL (ref 8.4–10.5)
CO2: 29 meq/L (ref 19–32)
Chloride: 103 mEq/L (ref 96–112)
Creatinine, Ser: 1.45 mg/dL (ref 0.40–1.50)
GFR: 60.58 mL/min (ref >60.00)
GLUCOSE: 120 mg/dL — AB (ref 70–99)
POTASSIUM: 4.1 meq/L (ref 3.5–5.1)
Sodium: 140 mEq/L (ref 135–145)
TOTAL PROTEIN: 7.4 g/dL (ref 6.0–8.3)
Total Bilirubin: 0.5 mg/dL (ref 0.2–1.2)

## 2015-11-15 LAB — LIPID PANEL
CHOL/HDL RATIO: 3
CHOLESTEROL: 118 mg/dL (ref 0–200)
HDL: 36.8 mg/dL — AB (ref >39.00)
LDL Cholesterol: 65 mg/dL (ref 0–99)
NonHDL: 81.12
TRIGLYCERIDES: 82 mg/dL (ref 0.0–149.0)
VLDL: 16.4 mg/dL (ref 0.0–40.0)

## 2015-11-15 LAB — TESTOSTERONE: Testosterone: 332.19 ng/dL (ref 300.00–890.00)

## 2015-11-15 LAB — POCT GLYCOSYLATED HEMOGLOBIN (HGB A1C): HEMOGLOBIN A1C: 6.7

## 2015-11-15 NOTE — Progress Notes (Signed)
Patient ID: Jonathan [name]., male   DOB: 1935-10-12, 80 y.o.   MRN: [id number]    Reason for Appointment:  Follow-up  History of Present Illness:          Diagnosis: Type 2 diabetes mellitus, date of diagnosis:  2009      Past history: He is not clear what his diabetes was diagnosed but according to hospital records it may have been in 2009. Most likely he was placed on metformin initially and at some point Amaryl added. In 2013 it was also given Januvia presumably to improve his control. However appears that his A1c has been consistently over 7% Janumet was started instead of metformin and Januvia separately in 2013  He was started on Trulicity in [id number] instead of Victoza because excessive bruising on his abdomen  Later in 01/2015 he was switched to Bydureon because of insurance noncoverage of his Trulicity  Recent history:   Because of his complaining about excessive pain with Bydureon he was recommended Trulicity but since this was not covered by insurance he is now on Tanzeum 50 mg weekly since about 12/16 Also his A1c is about the same at 6.7, previously 6.9 He has no difficulties taking the Tanzeum injection and no nausea His weight is slightly better  Now taking metformin 1 g  a day in the mornings, dose was reduced previously because of relatively high creatinine Also taking Amaryl 4 mg in the mornings However he thinks that once or twice a week he will feel shaky and blood sugar below at lunchtime, lowest documented reading is 85 at home. Checking blood sugars mostly morning and midday and these are fairly good but has a couple of slightly higher readings in the evenings     Oral hypoglycemic drugs the patient is taking are: Metformin 1000 once a day, Amaryl 4 mg daily in am     Side effects from medications have been: None  Glucose monitoring:  done  Once a day         Glucometer:  FreeStyle Blood Glucose readings  by download:  Mean values apply above for all  meters except median for One Touch  PRE-MEAL Fasting Lunch Dinner Bedtime Overall  Glucose range:  113-134   85    121    Mean/median:  120     129   POST-MEAL PC Breakfast PC Lunch PC Dinner  Glucose range:   154   176   Mean/median:       Glycemic control:  Lab Results  Component Value Date   HGBA1C 6.7 11/15/2015   HGBA1C 6.2 04/15/2015   HGBA1C 6.9* 12/13/2014   Lab Results  Component Value Date   MICROALBUR 0.9 08/16/2015   LDLCALC 65 11/18/2014   CREATININE 1.42 08/16/2015    Self-care:      Meals: 3 meals per day. eating egg/meat breading for breakfast, dinner 5-6   Exercise: Walks 3-4/7 for 1 hr at gym        Dietician visit: Most recent: 4/16 .               Retinal exam: Most recent: 3/15 .    Weight history:  Wt Readings from Last 3 Encounters:  11/15/15 251 lb 6.4 oz (114.034 kg)  09/22/15 250 lb 4 oz (113.513 kg)  08/16/15 254 lb 9.6 oz (115.486 kg)      Medication List       This list is accurate as of: 11/15/15 12:19 PM.  Always use your most recent med list.               Albiglutide 50 MG Pen  Commonly known as:  TANZEUM  Inject the contents of one pen once per week     amLODipine 10 MG tablet  Commonly known as:  NORVASC  TAKE 1 TABLET DAILY     aspirin 81 MG tablet  Take 81 mg by mouth daily.     clopidogrel 75 MG tablet  Commonly known as:  PLAVIX  Take 1 tablet (75 mg total) by mouth daily.     freestyle lancets  Use as instructed to check blood sugar twice a day dx code E11.65     FREESTYLE LITE test strip  Generic drug:  glucose blood  USE AS INSTRUCTED TO CHECK BLOOD SUGAR TWICE A DAY     glimepiride 4 MG tablet  Commonly known as:  AMARYL  TAKE ONE AND ONE-HALF TABLETS DAILY     losartan 100 MG tablet  Commonly known as:  COZAAR  Take 1 tablet (100 mg total) by mouth daily.     metFORMIN 1000 MG tablet  Commonly known as:  GLUCOPHAGE  Take 1,000 mg by mouth daily.     metoprolol succinate 100 MG 24 hr tablet    Commonly known as:  TOPROL XL  Take 1 tablet (100 mg total) by mouth daily.     multivitamin with minerals Tabs tablet  Take 1 tablet by mouth daily.     simvastatin 20 MG tablet  Commonly known as:  ZOCOR  Take 1 tablet (20 mg total) by mouth at bedtime.     SPIRIVA HANDIHALER 18 MCG inhalation capsule  Generic drug:  tiotropium  INHALE THE CONTENTS OF 1 CAPSULE DAILY     SYMBICORT 160-4.5 MCG/ACT inhaler  Generic drug:  budesonide-formoterol  USE 2 INHALATIONS TWICE A DAY     Testosterone 20.25 MG/ACT (1.62%) Gel  Place 4 application onto the skin daily.     triamterene-hydrochlorothiazide 37.5-25 MG tablet  Commonly known as:  MAXZIDE-25  Take 1 tablet by mouth daily.     VENTOLIN HFA 108 (90 Base) MCG/ACT inhaler  Generic drug:  albuterol  Inhale 2 puffs into the lungs every 6 (six) hours as needed for wheezing or shortness of breath.        Allergies: No Known Allergies  Past Medical History  Diagnosis Date  . CAD (coronary artery disease)     Two RCA stents remotely / 3rd RCA stent 2006  . PVD (peripheral vascular disease) ([place])     s/p stents at [place] 2009, Dr Einar Gip  . Colon polyps     s/p several Cscopes.  . Diabetes mellitus     dx aprox 2009  . Hyperlipidemia     dx in 90s  . Hypertension     dx in the 90  . ED (erectile dysfunction)     has a vacumm device  . Hypogonadism male   . Shortness of breath     O2 Sat dropped to 82% walking on the treadmill, September, 2012  . COPD (chronic obstructive pulmonary disease) ([facility])     on O2, nocturnal  . Ejection fraction     Past Surgical History  Procedure Laterality Date  . Appendectomy    . Tonsillectomy      Family History  Problem Relation Age of Onset  . Heart disease Father   . Hypertension Father   . Stroke Father   .  Diabetes Other     GM, nephews, many family members  . Hyperlipidemia Other     ?  . Colon cancer Neg Hx   . Prostate cancer Neg Hx   . Diabetes Paternal Aunt   .  Diabetes Maternal Grandmother     Social History:  reports that he quit smoking about 38 years ago. His smoking use included Cigarettes. He has a 60 pack-year smoking history. He has never used smokeless tobacco. He reports that he drinks alcohol. He reports that he does not use illicit drugs.    Review of Systems       Lipids: He has been treated with simvastatin 20 mg with good control       Lab Results  Component Value Date   CHOL 123 11/18/2014   HDL 38.00* 11/18/2014   LDLCALC 65 11/18/2014   TRIG 102.0 11/18/2014   CHOLHDL 3 11/18/2014                      Has had erectile dysfunction for quite sometime.  Also had decreased libido previously and was found to have hypogonadism, probably in 2011. Original evaluation is not available.  He did have a slightly low free testosterone level in 2012 on treatment   Symptoms are controlled with AndroGel but he sometimes does get fatigued in the evenings  On his last visit his testosterone level was still relatively high and he apparently was applying the AndroGel 2 pumps on the arms and 2 pumps on the legs and was told to reduce the dose is to 2 pumps on the arms only Recent lab not available   Lab Results  Component Value Date   TESTOSTERONE 657 08/17/2015         HYPERTENSION: This is followed by PCP and is on 100 mg of losartan along with Maxide, amlodipine and metoprolol    LABS:  Office Visit on 11/15/2015  Component Date Value Ref Range Status  . Hemoglobin A1C 11/15/2015 6.7   Final    Physical Examination:  BP 124/76 mmHg  Pulse 71  Temp(Src) 97.7 F (36.5 C)  Resp 16  Ht 6\' 1"  (1.854 m)  Wt 251 lb 6.4 oz (114.034 kg)  BMI 33.18 kg/m2  SpO2 93%     ASSESSMENT/PLAN:   Diabetes type 2, with obesity See history of present illness for detailed discussion of current diabetes management, blood sugar patterns and problems identified His A1c is still below 7 and this is probably adequate He is generally  compliant with diet and exercise and has lost a little weight He may be getting a tendency to low sugars around lunchtime and possibly higher readings after supper although he is not monitoring enough to identify these  For now he will try to split the Amaryl to half tablet twice a day instead of 4 mg in the mornings He will check more readings after supper also  Hypertension: good control   HYPOGONADISM: He has been using AndroGel 1 pump on each arm daily Will check his level today and decide on the dosage Will also check his prolactin as no report is available on file  RENAL dysfunction: Follow-up to be done today   Patient Instructions  1/2 in am and 1/2 before supper of Glimeperide  More sugars at 7-9 pm at [facility] 11/15/2015, 12:19 PM   Note: This office note was prepared with Dragon voice recognition system technology. Any transcriptional errors that result from  this process are unintentional.

## 2015-11-15 NOTE — Patient Instructions (Signed)
1/2 in am and 1/2 before supper of Glimeperide  More sugars at 7-9 pm at [employer]

## 2015-11-16 LAB — PROLACTIN: Prolactin: 8.5 ng/mL (ref 4.0–15.2)

## 2015-11-18 ENCOUNTER — Other Ambulatory Visit: Payer: Self-pay | Admitting: Endocrinology

## 2015-12-12 ENCOUNTER — Encounter: Payer: Self-pay | Admitting: Internal Medicine

## 2015-12-12 ENCOUNTER — Ambulatory Visit (INDEPENDENT_AMBULATORY_CARE_PROVIDER_SITE_OTHER): Payer: Medicare Other | Admitting: Internal Medicine

## 2015-12-12 ENCOUNTER — Ambulatory Visit (INDEPENDENT_AMBULATORY_CARE_PROVIDER_SITE_OTHER)
Admission: RE | Admit: 2015-12-12 | Discharge: 2015-12-12 | Disposition: A | Discharge status: Home / Self Care | Payer: Medicare Other | Source: Ambulatory Visit | Attending: Internal Medicine | Admitting: Internal Medicine

## 2015-12-12 VITALS — BP 132/72 | HR 69 | Ht 73.0 in | Wt 254.6 lb

## 2015-12-12 DIAGNOSIS — Z87891 Personal history of nicotine dependence: Secondary | ICD-10-CM

## 2015-12-12 DIAGNOSIS — I2510 Atherosclerotic heart disease of native coronary artery without angina pectoris: Secondary | ICD-10-CM | POA: Diagnosis not present

## 2015-12-12 DIAGNOSIS — J449 Chronic obstructive pulmonary disease, unspecified: Secondary | ICD-10-CM | POA: Diagnosis not present

## 2015-12-12 DIAGNOSIS — Z0000 Encounter for general adult medical examination without abnormal findings: Secondary | ICD-10-CM | POA: Diagnosis not present

## 2015-12-12 DIAGNOSIS — I517 Cardiomegaly: Secondary | ICD-10-CM | POA: Diagnosis not present

## 2015-12-12 NOTE — Patient Instructions (Addendum)
Order CXR-    Dx dyspnea, former smoker  Ok to continue O2 for sleep/ as needed  Please call as needed

## 2015-12-12 NOTE — Progress Notes (Signed)
05/01/12- 68 yoM former smoker referred by Dr. Larose Kells for SOB.  2 PPD until 1987 Patient c/o sob and wheezing. Denies  chest pain, and chest tightness. He says he has not been as aware of shortness of breath as Dr. Larose Kells. In [employer] service a nurse commented on his labored breathing. He does not snore and does not make loud breathing noises but apparently is perceived to have  increased respiratory effort and some wheeze. This is perennial and may be worse supine. His breathing is not waking him during the night and he denies cough, choking or strangling. He is able to walk long distances. He tried Spiriva and Symbicort without effect. Denies any history of pneumonia, asthma or an anemia. History of myocardial infarction with coronary and femoral stents. He does not notice claudication. PFT- 07/10/11-  Normal spirometry flows with small- airway response to bronchodilator, normal lung volumes and moderately reduced diffusion. FEV1/FVC 0.79, DLCO 58% CXR 03/19/12-reviewed with him CHEST - 2 VIEW  Comparison: 06/28/2008  Findings: The heart size and mediastinal contours are within normal  limits. Both lungs are clear. The visualized skeletal structures  are unremarkable.  IMPRESSION:  Negative exam.  Original Report Authenticated By: Angelita Ingles, M.D.  REST Stress Myoview -05/25/11 Impression  Exercise Capacity: Good exercise capacity.  BP Response: Normal blood pressure response.  Clinical Symptoms: Mild chest pain/dyspnea.  ECG Impression: No significant ST segment change suggestive of ischemia.  Comparison with Prior Nuclear Study: No images to compare  Overall Impression: Small inferior wall infarct with mild periinfarct ischemia   06/18/12- 76 yoM former smoker followed for dyspnea/ labored breathing, complicated by CAD/ MI Gets short of breath lying in bed-may wake him. Denies snoring. Off Spiriva. 6MWT -06/18/12- 96%, 88%, 94% 449 M. Significant desaturation with exercise.  09/18/12- 54 yoM  former smoker followed for dyspnea/ labored breathing, complicated by CAD/ MI Follows For : pt states having no issues today. states that wife tells him he breaths thru his mouth  at night. pt is on  2L at night/ APS.  Overnight oximetry on room air 06/23/2012 had demonstrated over 5 hours with saturation less than 80% while asleep. We had called to start home oxygen during sleep. He now wants to travel to [place] and then to [place]. I discussed his oxygen status the reason for prescribing oxygen. I am suggesting he get a portable concentrator for this trip.  06/04/13- 47 yoM former smoker followed for dyspnea/ labored breathing, complicated by CAD/ MI FOLLOWS FOR: has been doing good with breathing since wearing O2 2L/ APS at night. Needs O2 recert for O2 uses at night and with travel (pt states he uses O2 during the day while at home if needed) [facility] frequently and often leaves his oxygen behind. Paces himself. Sleeps better at night with oxygen. Frequent wheeze but no productive cough. Continues Symbicort, Spiriva.  12/08/13- 26 yoM former smoker followed for dyspnea/ labored breathing, complicated by CAD/ MI FOLLOWS FOR: Tries to stay active but continues to have SOB. Would like to discuss O2 options-DMe is APS. Needs something smaller and lightweight. Reviewed PFT, ONOX and 6MWT. Desats with sleep and exertion. Nearly normal PFT except low DLCO. No hx PE or murmur. + hx MI and PAD. He uses standard concentrator for sleep, older portable concentrator as needed during activity and for travel. CXR 06/08/13 IMPRESSION:  No active cardiopulmonary disease.  Electronically Signed  By: Lajean Manes  On: 06/04/2013 10:59   06/10/14- 22 yoM former smoker followed  for dyspnea/ labored breathing, complicated by CAD/ MI FOLLOWS FOR:sob-better,no cough,occass. wheezing,denies cp or tightness,wears oxygen 2L at hs O2 2L/ APS Very little cough or wheeze. Uses Symbicort and Spiriva as directed and never  sees need for rescue inhaler. Cardiac status stable, controlled.   12/09/14- 72 yoM former smoker followed for dyspnea/ labored breathing, complicated by CAD/ MI, DM O2 2L/ APS for sleep FOLLOWS FOR sob better.Occasional productive cough with clear mucus. Occasional wheezing. Going to gym 3 days per week-cardio. Little cough with scant clear mucus, little wheeze. Comfortable with current medications-reviewed. CXR 06/10/14 IMPRESSION: No acute cardiopulmonary process. Electronically Signed  By: Lovey Newcomer M.D.  On: 06/10/2014 14:33  12/12/2015-80 year old male former smoker followed for dyspnea/labored breathing, complicated by CAD/MI, DM O2 2 L/APS-sleep and exertion Follows for: COPD. Pt c/o minimal SOB, some wheeze. Pt denies CP/tightness/cough. Pt has been going to the gym regularly and feels it has helped him a lot. Pt does not use albuterol HFA on a regular basis.  He reports only minimal awareness of dyspnea on exertion. Uses oxygen for sleep intermittently as needed. No recent cardiac events or acute respiratory infections. Continues Symbicort daily but has rarely needed rescue inhaler anymore. No routine cough or phlegm, chest pain, palpitations or fever.  ROS-see HPI Constitutional:   No-   weight loss, night sweats, fevers, chills, fatigue, lassitude. HEENT:   No-  headaches, difficulty swallowing, tooth/dental problems, sore throat,       No-  sneezing, itching, ear ache, nasal congestion, post nasal drip,  CV:  No-   chest pain, orthopnea, PND, swelling in lower extremities, anasarca, dizziness, palpitations Resp:   + "sometimes"shortness of breath with exertion or at rest.              No-   productive cough,  No non-productive cough,  No- coughing up of blood.              No-   change in color of mucus.  + wheezing.   Skin: No-   rash or lesions. GI:  No-   heartburn, indigestion, abdominal pain, nausea, vomiting, GU:  MS:  No-   joint pain or swelling.   Neuro-      nothing unusual Psych:  No- change in mood or affect. No depression or anxiety.  No memory loss.  OBJ- Physical Examrest on room air General- Alert, Oriented, Affect-appropriate, Distress- none acute. Overweight Skin- rash-none, lesions- none, excoriation- none Lymphadenopathy- none Head- atraumatic            Eyes- Gross vision intact, PERRLA, conjunctivae and secretions clear            Ears- Hearing, canals-normal            Nose- Clear, no-Septal dev, mucus, polyps, erosion, perforation             Throat- Mallampati II , mucosa clear , drainage- none, tonsils- atrophic Neck- flexible , trachea midline, no stridor , thyroid nl, carotid no bruit Chest - symmetrical excursion , unlabored           Heart/CV- RRR , no murmur , no gallop  , no rub, nl s1 s2                           - JVD-none , edema- none, stasis changes- none, varices- none           Lung- clear, cough-none , dullness-none, rub- none  Chest wall-  Abd-  Br/ Gen/ Rectal- Not done, not indicated Extrem- cyanosis- none, clubbing?- Question mild spooning of nails; atrophy- none, strength- nl Neuro- grossly intact to observation

## 2015-12-12 NOTE — Assessment & Plan Note (Signed)
He denies acute problems and is comfortable with going to gym for exercise 3 days per week.

## 2015-12-12 NOTE — Assessment & Plan Note (Signed)
He reports doing extremely well. Not clear that he needs oxygen anymore but he owns his machine now. Plan-chest x-ray, continue Symbicort

## 2015-12-28 ENCOUNTER — Ambulatory Visit (INDEPENDENT_AMBULATORY_CARE_PROVIDER_SITE_OTHER): Payer: Medicare Other | Admitting: Podiatry

## 2015-12-28 ENCOUNTER — Encounter: Payer: Self-pay | Admitting: Podiatry

## 2015-12-28 VITALS — BP 119/74 | HR 80

## 2015-12-28 DIAGNOSIS — B351 Tinea unguium: Secondary | ICD-10-CM | POA: Diagnosis not present

## 2015-12-28 DIAGNOSIS — M79673 Pain in unspecified foot: Secondary | ICD-10-CM | POA: Diagnosis not present

## 2015-12-28 DIAGNOSIS — E1140 Type 2 diabetes mellitus with diabetic neuropathy, unspecified: Secondary | ICD-10-CM | POA: Diagnosis not present

## 2015-12-28 DIAGNOSIS — M79606 Pain in leg, unspecified: Secondary | ICD-10-CM

## 2015-12-28 DIAGNOSIS — M2040 Other hammer toe(s) (acquired), unspecified foot: Secondary | ICD-10-CM | POA: Diagnosis not present

## 2015-12-28 NOTE — Progress Notes (Signed)
Subjective:  80 year old Diabetic male presents requesting painful nails trimmed. Right great toe nail is thick and dark.  Also has a painful corn on 5th digit right. Patient request for diabetic shoes. He has contacted his PCP for the shoes.   Objective:  Thick dystrophic nails x 10.  Thick nail with old blood on nail bed right hallucal nail. Contracted lesser digits bilateral with painful corn on 5th digit on right foot  Cavus type foot.  All pedal pulses palpable.  No open skin lesion or acute problems noted.   Assessment: Onychomycosis x 10.  Traumatized and deformedd right hallucal nail. Hammer toe deformities 2-5 bilateral.  Painful digital corns 5th right.   Treatment:  Debrided 5th digital corn and all nails x 10.  Both feet measured for diabetic shoes. Return in 3 months or as needed.

## 2015-12-28 NOTE — Patient Instructions (Signed)
Seen for hypertrophic nails. All nails debrided. Return in 3 months or as needed.  

## 2016-01-07 ENCOUNTER — Other Ambulatory Visit: Payer: Self-pay | Admitting: Endocrinology

## 2016-01-11 DIAGNOSIS — E119 Type 2 diabetes mellitus without complications: Secondary | ICD-10-CM | POA: Diagnosis not present

## 2016-01-11 DIAGNOSIS — H43812 Vitreous degeneration, left eye: Secondary | ICD-10-CM | POA: Diagnosis not present

## 2016-01-11 DIAGNOSIS — H2513 Age-related nuclear cataract, bilateral: Secondary | ICD-10-CM | POA: Diagnosis not present

## 2016-01-11 LAB — HM DIABETES EYE EXAM

## 2016-01-15 ENCOUNTER — Other Ambulatory Visit: Payer: Self-pay | Admitting: Endocrinology

## 2016-01-23 ENCOUNTER — Encounter: Payer: Self-pay | Admitting: Internal Medicine

## 2016-01-23 ENCOUNTER — Ambulatory Visit (INDEPENDENT_AMBULATORY_CARE_PROVIDER_SITE_OTHER): Payer: Medicare Other | Admitting: Cardiology

## 2016-01-23 ENCOUNTER — Encounter: Payer: Self-pay | Admitting: Cardiology

## 2016-01-23 VITALS — BP 145/90 | HR 75 | Ht 72.0 in | Wt 257.0 lb

## 2016-01-23 DIAGNOSIS — E785 Hyperlipidemia, unspecified: Secondary | ICD-10-CM

## 2016-01-23 DIAGNOSIS — I2510 Atherosclerotic heart disease of native coronary artery without angina pectoris: Secondary | ICD-10-CM | POA: Insufficient documentation

## 2016-01-23 DIAGNOSIS — I10 Essential (primary) hypertension: Secondary | ICD-10-CM

## 2016-01-23 DIAGNOSIS — J439 Emphysema, unspecified: Secondary | ICD-10-CM | POA: Diagnosis not present

## 2016-01-23 DIAGNOSIS — R252 Cramp and spasm: Secondary | ICD-10-CM | POA: Insufficient documentation

## 2016-01-23 DIAGNOSIS — I739 Peripheral vascular disease, unspecified: Secondary | ICD-10-CM

## 2016-01-23 NOTE — Patient Instructions (Signed)

## 2016-01-23 NOTE — Progress Notes (Signed)
Cardiology Office Note   Date:  01/23/2016   ID:  Jonathan [name]., DOB May 02, 1936, MRN [id number]  PCP:  Kathlene November, MD  Cardiologist:  Dorothy Spark, MD , previously Dr. Ron Parker.  Chief Complaint  Patient presents with  . Follow-up    former Dr. Ron Parker pt  . Coronary Artery Disease  . Shortness of Breath   History of Present Illness: Stepehen [name]. is a 80 y.o. male , This is a very pleasant patient was previously followed by Dr. Ron Parker. He has history of COPD on home O2 that he uses only at night. He quit smoking in 1978. He has history of coronary artery disease with 2 stents placed into his RCA in 2006, prior to that he had chest pain that he hasn't experienced ever since. He had negative stress test in 2012 and his symptoms hasn't changed since. His only complaint today is shortness of breath and wheezing that he attributes to COPD. He otherwise denies palpitations or syncope. He goes to gym 4 times a week and exercises on treadmill for 4050 minutes and denies any symptoms other than shortness of breath. He is also complaining of lower extremity cramping at night.  Past Medical History  Diagnosis Date  . CAD (coronary artery disease)     Two RCA stents remotely / 3rd RCA stent 2006  . PVD (peripheral vascular disease) ([place])     s/p stents at LE 2009, Dr Einar Gip  . Colon polyps     s/p several Cscopes.  . Diabetes mellitus     dx aprox 2009  . Hyperlipidemia     dx in 90s  . Hypertension     dx in the 90  . ED (erectile dysfunction)     has a vacumm device  . Hypogonadism male   . Shortness of breath     O2 Sat dropped to 82% walking on the treadmill, September, 2012  . COPD (chronic obstructive pulmonary disease) (HCC)     on O2, nocturnal  . Ejection fraction     Past Surgical History  Procedure Laterality Date  . Appendectomy    . Tonsillectomy      Patient Active Problem List   Diagnosis Date Noted  . Hypogonadism male 12/16/2014  . Chronic kidney  disease, stage II (mild) 02/28/2014  . Pain in lower limb ([place]) 01/08/2014  . Ejection fraction   . Pain in toe 04/10/2013  . Onychomycosis 04/10/2013  . Essential tremor 10/14/2012  . Annual physical exam 12/11/2011  . Vitamin D deficiency 12/11/2011  . Back pain 06/18/2011  . COPD mixed type ([place])   . CAD (coronary artery disease)   . PVD (peripheral vascular disease) ([place])   . HEADACHE 10/06/2010  . Hypogonadism in male 06/21/2010  . DMII (diabetes mellitus, type 2) ([place]) 03/17/2010  . Hyperlipidemia 03/17/2010  . Essential hypertension 03/17/2010      Current Outpatient Prescriptions  Medication Sig Dispense Refill  . albuterol (VENTOLIN HFA) 108 (90 BASE) MCG/ACT inhaler Inhale 2 puffs into the lungs every 6 (six) hours as needed for wheezing or shortness of breath.    Marland Kitchen amLODipine (NORVASC) 10 MG tablet TAKE 1 TABLET DAILY 90 tablet 3  . aspirin 81 MG tablet Take 81 mg by mouth daily.      . clopidogrel (PLAVIX) 75 MG tablet Take 1 tablet (75 mg total) by mouth daily. 90 tablet 1  . FREESTYLE LITE test strip USE AS INSTRUCTED TO CHECK BLOOD  SUGAR TWICE A DAY 200 each 3  . glimepiride (AMARYL) 4 MG tablet TAKE ONE AND ONE-HALF TABLETS DAILY 135 tablet 1  . Lancets (FREESTYLE) lancets Use as instructed to check blood sugar twice a day dx code E11.65 200 each 1  . losartan (COZAAR) 100 MG tablet Take 1 tablet (100 mg total) by mouth daily. 90 tablet 1  . metFORMIN (GLUCOPHAGE) 1000 MG tablet TAKE 1 TABLET TWICE A DAY WITH MEALS 180 tablet 1  . metoprolol succinate (TOPROL XL) 100 MG 24 hr tablet Take 1 tablet (100 mg total) by mouth daily. 90 tablet 2  . Multiple Vitamin (MULTIVITAMIN WITH MINERALS) TABS tablet Take 1 tablet by mouth daily.    . simvastatin (ZOCOR) 20 MG tablet Take 1 tablet (20 mg total) by mouth at bedtime. 90 tablet 0  . SPIRIVA HANDIHALER 18 MCG inhalation capsule INHALE THE CONTENTS OF 1 CAPSULE DAILY 90 capsule 3  . SYMBICORT 160-4.5 MCG/ACT inhaler USE  2 INHALATIONS TWICE A DAY 30.6 g 0  . TANZEUM 50 MG PEN INJECT THE CONTENTS OF 1 PEN EVERY WEEK 12 each 0  . Testosterone 20.25 MG/ACT (1.62%) GEL Place 4 application onto the skin daily. 225 g 3  . triamterene-hydrochlorothiazide (MAXZIDE-25) 37.5-25 MG tablet Take 1 tablet by mouth daily. 90 tablet 2  . [DISCONTINUED] sitaGLIPtan (JANUVIA) 100 MG tablet Take 1 tablet (100 mg total) by mouth daily. 90 tablet 1   No current facility-administered medications for this visit.    Allergies:   Review of patient's allergies indicates no known allergies.    Social History:  The patient  reports that he quit smoking about 38 years ago. His smoking use included Cigarettes. He has a 60 pack-year smoking history. He has never used smokeless tobacco. He reports that he drinks alcohol. He reports that he does not use illicit drugs.   Family History:  The patient's family history includes Diabetes in his maternal grandmother, other, and paternal aunt; Heart disease in his father; Hyperlipidemia in his other; Hypertension in his father; Stroke in his father. There is no history of Colon cancer or Prostate cancer.    ROS:  Please see the history of present illness.     Patient denies fever, chills, headache, sweats, rash, change in vision, change in hearing, chest pain, cough, nausea or vomiting, urinary symptoms. All other systems are reviewed and are negative.   PHYSICAL EXAM: VS:  Ht 6' (1.829 m)  Wt 257 lb (116.574 kg)  BMI 34.85 kg/m2 , the patient is oriented to person time and place. Affect is normal. Head is atraumatic. Sclera and conjunctiva are normal. There is no jugular venous distention. Lungs are clear. Respiratory effort is nonlabored. Cardiac exam reveals an S1 and S2. There is very mild 2/6 systolic murmur. The abdomen is soft. There is no peripheral edema. There are no musculoskeletal deformities. There are no skin rashes. Neurologic is grossly intact. He has good pulses bilaterally.  EKG:   Performed on 01/23/2016 shows sinus rhythm first-degree AV block otherwise normal EKG and unchanged from prior.    Recent Labs: 03/22/2015: Hemoglobin 18.0 Repeated and verified X2.*; Platelets 194.0 08/16/2015: TSH 2.25 11/15/2015: ALT 22; BUN 18; Creatinine, Ser 1.45; Potassium 4.1; Sodium 140   Lipid Panel    Component Value Date/Time   CHOL 118 11/15/2015 0911   TRIG 82.0 11/15/2015 0911   HDL 36.80* 11/15/2015 0911   CHOLHDL 3 11/15/2015 0911   VLDL 16.4 11/15/2015 0911   LDLCALC 65 11/15/2015  0911      Wt Readings from Last 3 Encounters:  01/23/16 257 lb (116.574 kg)  12/12/15 254 lb 9.6 oz (115.486 kg)  11/15/15 251 lb 6.4 oz (114.034 kg)     ASSESSMENT AND PLAN:  1. CAD - prior stents placed in 2006, negative stress test in 2012, he LVEF is 55%. We'll continue aspirin simvastatin, losartan. No ischemic workup needed at this point.  2. Essential hypertension - recheck blood pressure 130/80, I will continue the same regimen.  3. Hyperlipidemia - lipids at goal in February 2017 with LDL 65. Triglycerides 82.  4. CK D stage III with stable creatinine 1.4 in February 2017, we'll continue same dose of losartan.  5. Lower extremity cramping - patient is advised to buy over-the-counter magnesium.  Up in 6 months.  Dorothy Spark, MD 01/23/2016

## 2016-02-13 ENCOUNTER — Other Ambulatory Visit: Payer: Self-pay | Admitting: Endocrinology

## 2016-02-15 ENCOUNTER — Ambulatory Visit (INDEPENDENT_AMBULATORY_CARE_PROVIDER_SITE_OTHER): Payer: Medicare Other | Admitting: Endocrinology

## 2016-02-15 ENCOUNTER — Encounter: Payer: Self-pay | Admitting: Endocrinology

## 2016-02-15 VITALS — BP 130/88 | HR 70 | Temp 98.0°F | Resp 16 | Ht 72.0 in | Wt 256.2 lb

## 2016-02-15 DIAGNOSIS — E291 Testicular hypofunction: Secondary | ICD-10-CM | POA: Diagnosis not present

## 2016-02-15 DIAGNOSIS — E119 Type 2 diabetes mellitus without complications: Secondary | ICD-10-CM | POA: Diagnosis not present

## 2016-02-15 DIAGNOSIS — I2510 Atherosclerotic heart disease of native coronary artery without angina pectoris: Secondary | ICD-10-CM

## 2016-02-15 LAB — MICROALBUMIN / CREATININE URINE RATIO
Creatinine,U: 205.4 mg/dL
MICROALB UR: 1 mg/dL (ref 0.0–1.9)
Microalb Creat Ratio: 0.5 mg/g (ref 0.0–30.0)

## 2016-02-15 LAB — COMPREHENSIVE METABOLIC PANEL
ALBUMIN: 4.1 g/dL (ref 3.5–5.2)
ALT: 17 U/L (ref 0–53)
AST: 17 U/L (ref 0–37)
Alkaline Phosphatase: 59 U/L (ref 39–117)
BUN: 21 mg/dL (ref 6–23)
CHLORIDE: 106 meq/L (ref 96–112)
CO2: 28 mEq/L (ref 19–32)
CREATININE: 1.56 mg/dL — AB (ref 0.40–1.50)
Calcium: 9.6 mg/dL (ref 8.4–10.5)
GFR: 55.64 mL/min — ABNORMAL LOW (ref >60.00)
GLUCOSE: 136 mg/dL — AB (ref 70–99)
Potassium: 4 mEq/L (ref 3.5–5.1)
SODIUM: 139 meq/L (ref 135–145)
Total Bilirubin: 0.4 mg/dL (ref 0.2–1.2)
Total Protein: 7 g/dL (ref 6.0–8.3)

## 2016-02-15 LAB — POCT GLYCOSYLATED HEMOGLOBIN (HGB A1C): Hemoglobin A1C: 6.8

## 2016-02-15 NOTE — Progress Notes (Signed)
Patient ID: Jonathan Leather., male   DOB: Aug 24, 1936, 80 y.o.   MRN: [id number]    Reason for Appointment:  Follow-up  History of Present Illness:          Diagnosis: Type 2 diabetes mellitus, date of diagnosis:  2009      Past history: He is not clear what his diabetes was diagnosed but according to hospital records it may have been in 2009. Most likely he was placed on metformin initially and at some point Amaryl added. In 2013 it was also given Januvia presumably to improve his control. However appears that his A1c has been consistently over 7% Janumet was started instead of metformin and Januvia separately in 2013  He was started on Trulicity in [id number] instead of Victoza because excessive bruising on his abdomen  Later in 01/2015 he was switched to Bydureon because of insurance noncoverage of his Trulicity  Recent history:   Oral hypoglycemic drugs the patient is taking are: Metformin 1000 once a day, Amaryl 4 mg 1/2 bid  Because of his complaining about excessive pain with Bydureon he was recommended Trulicity but since this was not covered by insurance he is now on Tanzeum 50 mg weekly since about 12/16  Also his A1c is about the same at 6.8, previously 6.7  Now taking metformin 1 g  a day in the mornings, dose was reduced previously because of relatively high creatinine With taking half tablet twice a day with his Amaryl he has not had any symptoms of hypoglycemia during the day as before However did not bring his blood sugar monitor today but he thinks his blood sugars are fairly well controlled usually He has been doing well with exercise fairly regularly  His FASTING blood sugars are usually 110-112, later in the day usually 130 but a couple of times around 200 at night     Side effects from medications have been: None  Glucose monitoring:  done  Once a day         Glucometer:  FreeStyle Blood Glucose readings  by recall as above   Glycemic control:  Lab  Results  Component Value Date   HGBA1C 6.8 02/15/2016   HGBA1C 6.7 11/15/2015   HGBA1C 6.2 04/15/2015   Lab Results  Component Value Date   MICROALBUR 0.9 08/16/2015   LDLCALC 65 11/15/2015   CREATININE 1.45 11/15/2015    Self-care:      Meals: 3 meals per day. eating egg/meat breading for breakfast, dinner 5-6   Exercise: Going 3-4/7 with cardio around 50 minutes at the gym Dietician visit: Most recent: 4/16 .               Retinal exam: Most recent: 3/15 .    Weight history:  Wt Readings from Last 3 Encounters:  02/15/16 256 lb 3.2 oz (116.212 kg)  01/23/16 257 lb (116.574 kg)  12/12/15 254 lb 9.6 oz (115.486 kg)      Medication List       This list is accurate as of: 02/15/16  8:35 AM.  Always use your most recent med list.               amLODipine 10 MG tablet  Commonly known as:  NORVASC  TAKE 1 TABLET DAILY     aspirin 81 MG tablet  Take 81 mg by mouth daily.     clopidogrel 75 MG tablet  Commonly known as:  PLAVIX  Take 1 tablet (75 mg  total) by mouth daily.     freestyle lancets  USE AS INSTRUCTED TO CHECK BLOOD SUGAR TWICE A DAY     FREESTYLE LITE test strip  Generic drug:  glucose blood  USE AS INSTRUCTED TO CHECK BLOOD SUGAR TWICE A DAY     glimepiride 4 MG tablet  Commonly known as:  AMARYL  TAKE ONE AND ONE-HALF TABLETS DAILY     losartan 100 MG tablet  Commonly known as:  COZAAR  Take 1 tablet (100 mg total) by mouth daily.     metFORMIN 1000 MG tablet  Commonly known as:  GLUCOPHAGE  TAKE 1 TABLET TWICE A DAY WITH MEALS     metoprolol succinate 100 MG 24 hr tablet  Commonly known as:  TOPROL XL  Take 1 tablet (100 mg total) by mouth daily.     multivitamin with minerals Tabs tablet  Take 1 tablet by mouth daily.     simvastatin 20 MG tablet  Commonly known as:  ZOCOR  Take 1 tablet (20 mg total) by mouth at bedtime.     SPIRIVA HANDIHALER 18 MCG inhalation capsule  Generic drug:  tiotropium  INHALE THE CONTENTS OF 1  CAPSULE DAILY     SYMBICORT 160-4.5 MCG/ACT inhaler  Generic drug:  budesonide-formoterol  USE 2 INHALATIONS TWICE A DAY     TANZEUM 50 MG Pen  Generic drug:  Albiglutide  INJECT THE CONTENTS OF 1 PEN EVERY WEEK     Testosterone 20.25 MG/ACT (1.62%) Gel  Place 4 application onto the skin daily.     triamterene-hydrochlorothiazide 37.5-25 MG tablet  Commonly known as:  MAXZIDE-25  Take 1 tablet by mouth daily.     VENTOLIN HFA 108 (90 Base) MCG/ACT inhaler  Generic drug:  albuterol  Inhale 2 puffs into the lungs every 6 (six) hours as needed for wheezing or shortness of breath.        Allergies: No Known Allergies  Past Medical History  Diagnosis Date  . CAD (coronary artery disease)     Two RCA stents remotely / 3rd RCA stent 2006  . PVD (peripheral vascular disease) ([place])     s/p stents at LE 2009, Dr Einar Gip  . Colon polyps     s/p several Cscopes.  . Diabetes mellitus     dx aprox 2009  . Hyperlipidemia     dx in 90s  . Hypertension     dx in the 90  . ED (erectile dysfunction)     has a vacumm device  . Hypogonadism male   . Shortness of breath     O2 Sat dropped to 82% walking on the treadmill, September, 2012  . COPD (chronic obstructive pulmonary disease) (HCC)     on O2, nocturnal  . Ejection fraction     Past Surgical History  Procedure Laterality Date  . Appendectomy    . Tonsillectomy      Family History  Problem Relation Age of Onset  . Heart disease Father   . Hypertension Father   . Stroke Father   . Diabetes Other     GM, nephews, many family members  . Hyperlipidemia Other     ?  . Colon cancer Neg Hx   . Prostate cancer Neg Hx   . Diabetes Paternal Aunt   . Diabetes Maternal Grandmother     Social History:  reports that he quit smoking about 38 years ago. His smoking use included Cigarettes. He has a 60 pack-year smoking history.  He has never used smokeless tobacco. He reports that he drinks alcohol. He reports that he does not use  illicit drugs.    Review of Systems       Lipids: He has been treated with simvastatin 20 mg with good control       Lab Results  Component Value Date   CHOL 118 11/15/2015   HDL 36.80* 11/15/2015   LDLCALC 65 11/15/2015   TRIG 82.0 11/15/2015   CHOLHDL 3 11/15/2015                      Has had erectile dysfunction for quite sometime.  Also had decreased libido previously and was found to have hypogonadism, probably in 2011. Original evaluation is not available.   He did have a slightly low free testosterone level in 2012 on treatment Prolactin level normal  Symptoms are controlled with AndroGel Without any fatigue  On his last visit his testosterone level was just over 300 He now says that he does not take his AndroGel daily and does not understand the need to do so and may take it 3-4 times a week  Lab Results  Component Value Date   TESTOSTERONE 332.19 11/15/2015        HYPERTENSION: This is followed by PCP and is on 100 mg of losartan along with Maxide, amlodipine and metoprolol  Blood pressure relatively high today   LABS:  Office Visit on 02/15/2016  Component Date Value Ref Range Status  . Hemoglobin A1C 02/15/2016 6.8   Final    Physical Examination:  BP 130/88 mmHg  Pulse 70  Temp(Src) 98 F (36.7 C)  Resp 16  Ht 6' (1.829 m)  Wt 256 lb 3.2 oz (116.212 kg)  BMI 34.74 kg/m2  SpO2 95%     ASSESSMENT/PLAN:   Diabetes type 2, with obesity See history of present illness for detailed discussion of current diabetes management, blood sugar patterns and problems identified His A1c is still below 7 and this is Excellent considering his age Doing fairly well with taking Amaryl twice a day without hypoglycemia He is usually exercising and maintaining his weight Discussed blood sugar targets and need to do at least half of his readings after meals  Hypertension: He will follow-up with PCP  HYPOGONADISM: He has been using AndroGel 1 pump on each arm But  not everyday Advised them to take it daily for consistent levels  RENAL dysfunction: Follow-up to be done today   Patient Instructions  Check blood sugars on waking up 2-3  times a week Also check blood sugars about 2 hours after a meal and do this after different meals by rotation  Recommended blood sugar levels on waking up is 90-130 and about 2 hours after meal is 130-160  Please bring your blood sugar monitor to each visit, thank you  Take Androgel daily    Jonathan [name] 02/15/2016, 8:35 AM   Note: This office note was prepared with Dragon voice recognition system technology. Any transcriptional errors that result from this process are unintentional.

## 2016-02-15 NOTE — Patient Instructions (Signed)
Check blood sugars on waking up 2-3  times a week Also check blood sugars about 2 hours after a meal and do this after different meals by rotation  Recommended blood sugar levels on waking up is 90-130 and about 2 hours after meal is 130-160  Please bring your blood sugar monitor to each visit, thank you  Take Androgel daily

## 2016-02-15 NOTE — Addendum Note (Signed)
Addended by: Kaylyn Lim I on: 02/15/2016 11:14 AM   Modules accepted: Orders

## 2016-02-15 NOTE — Addendum Note (Signed)
Addended by: Kaylyn Lim I on: 02/15/2016 08:44 AM   Modules accepted: Orders

## 2016-03-13 ENCOUNTER — Other Ambulatory Visit: Payer: Self-pay | Admitting: Internal Medicine

## 2016-03-13 ENCOUNTER — Telehealth: Payer: Self-pay | Admitting: Internal Medicine

## 2016-03-23 ENCOUNTER — Encounter: Payer: Medicare Other | Admitting: Internal Medicine

## 2016-03-28 ENCOUNTER — Ambulatory Visit (INDEPENDENT_AMBULATORY_CARE_PROVIDER_SITE_OTHER): Payer: Medicare Other | Admitting: Podiatry

## 2016-03-28 ENCOUNTER — Encounter: Payer: Self-pay | Admitting: Podiatry

## 2016-03-28 VITALS — BP 158/88 | HR 70

## 2016-03-28 DIAGNOSIS — M79606 Pain in leg, unspecified: Secondary | ICD-10-CM | POA: Diagnosis not present

## 2016-03-28 DIAGNOSIS — B351 Tinea unguium: Secondary | ICD-10-CM

## 2016-03-28 DIAGNOSIS — M2040 Other hammer toe(s) (acquired), unspecified foot: Secondary | ICD-10-CM

## 2016-03-28 NOTE — Progress Notes (Signed)
Subjective:  80 year old Diabetic male presents requesting painful nails trimmed. Right great toe nail is thick and dark.  Also has a painful corn on 5th digit right.  Objective:  Dermatologic: Thick dystrophic nails x 10.  Yellow brown fungal debris under nail bed on both great toes. Multiple red bumps and dry peeling skin under the plantar aspect both feet.  Vascular: All pedal pulses are palpable. Neurologic: All epicritic and tactile sensations grossly intact. Orthopedic: Contracted lesser digits bilateral with painful corn on 5th digit on right foot  Cavus type foot.   Assessment: Onychomycosis x 10.  Tinea pedis plantar both feet. Hammer toe deformities 2-5 bilateral.  Painful digital corns 5th right.   Treatment:  Debrided 5th digital corn and all nails x 10.  Advised to scrub both feet with [place] blue shampoo at the end of shower daily. Keep socks on while wearing dress shoes. Return in 3 months or as needed.

## 2016-03-28 NOTE — Patient Instructions (Signed)
Seen for hypertrophic nails. All nails debrided. Return in 3 months or as needed.  

## 2016-04-02 ENCOUNTER — Encounter: Payer: Self-pay | Admitting: Internal Medicine

## 2016-04-02 ENCOUNTER — Encounter: Payer: Medicare Other | Admitting: Internal Medicine

## 2016-04-02 ENCOUNTER — Ambulatory Visit (INDEPENDENT_AMBULATORY_CARE_PROVIDER_SITE_OTHER): Payer: Medicare Other | Admitting: Internal Medicine

## 2016-04-02 VITALS — BP 124/76 | HR 71 | Temp 97.5°F | Ht 72.0 in | Wt 255.5 lb

## 2016-04-02 DIAGNOSIS — R7989 Other specified abnormal findings of blood chemistry: Secondary | ICD-10-CM | POA: Diagnosis not present

## 2016-04-02 DIAGNOSIS — I2510 Atherosclerotic heart disease of native coronary artery without angina pectoris: Secondary | ICD-10-CM | POA: Diagnosis not present

## 2016-04-02 DIAGNOSIS — E291 Testicular hypofunction: Secondary | ICD-10-CM

## 2016-04-02 DIAGNOSIS — R351 Nocturia: Secondary | ICD-10-CM

## 2016-04-02 DIAGNOSIS — E785 Hyperlipidemia, unspecified: Secondary | ICD-10-CM

## 2016-04-02 DIAGNOSIS — I10 Essential (primary) hypertension: Secondary | ICD-10-CM | POA: Diagnosis not present

## 2016-04-02 DIAGNOSIS — Z0000 Encounter for general adult medical examination without abnormal findings: Secondary | ICD-10-CM

## 2016-04-02 DIAGNOSIS — Z09 Encounter for follow-up examination after completed treatment for conditions other than malignant neoplasm: Secondary | ICD-10-CM

## 2016-04-02 DIAGNOSIS — Z125 Encounter for screening for malignant neoplasm of prostate: Secondary | ICD-10-CM

## 2016-04-02 LAB — URINALYSIS, ROUTINE W REFLEX MICROSCOPIC
BILIRUBIN URINE: NEGATIVE
Hgb urine dipstick: NEGATIVE
KETONES UR: NEGATIVE
Leukocytes, UA: NEGATIVE
NITRITE: NEGATIVE
PH: 6.5 (ref 5.0–8.0)
RBC / HPF: NONE SEEN (ref 0–?)
Specific Gravity, Urine: 1.01 (ref 1.000–1.030)
Total Protein, Urine: NEGATIVE
UROBILINOGEN UA: 0.2 (ref 0.0–1.0)
Urine Glucose: NEGATIVE
WBC UA: NONE SEEN (ref 0–?)

## 2016-04-02 LAB — PSA: PSA: 0.56 ng/mL (ref 0.10–4.00)

## 2016-04-02 LAB — LIPID PANEL
CHOL/HDL RATIO: 3
Cholesterol: 123 mg/dL (ref 0–200)
HDL: 35.1 mg/dL — ABNORMAL LOW (ref >39.00)
LDL CALC: 69 mg/dL (ref 0–99)
NONHDL: 87.6
Triglycerides: 91 mg/dL (ref 0.0–149.0)
VLDL: 18.2 mg/dL (ref 0.0–40.0)

## 2016-04-02 NOTE — Patient Instructions (Signed)
Get your blood work before you leave   Next visit in 6 months  Fall Prevention and Home Safety Falls cause injuries and can affect all age groups. It is possible to use preventive measures to significantly decrease the likelihood of falls. There are many simple measures which can make your home safer and prevent falls. OUTDOORS  Repair cracks and edges of walkways and driveways.  Remove high doorway thresholds.  Trim shrubbery on the main path into your home.  Have good outside lighting.  Clear walkways of tools, rocks, debris, and clutter.  Check that handrails are not broken and are securely fastened. Both sides of steps should have handrails.  Have leaves, snow, and ice cleared regularly.  Use sand or salt on walkways during winter months.  In the garage, clean up grease or oil spills. BATHROOM  Install night lights.  Install grab bars by the toilet and in the tub and shower.  Use non-skid mats or decals in the tub or shower.  Place a plastic non-slip stool in the shower to sit on, if needed.  Keep floors dry and clean up all water on the floor immediately.  Remove soap buildup in the tub or shower on a regular basis.  Secure bath mats with non-slip, double-sided rug tape.  Remove throw rugs and tripping hazards from the floors. BEDROOMS  Install night lights.  Make sure a bedside light is easy to reach.  Do not use oversized bedding.  Keep a telephone by your bedside.  Have a firm chair with side arms to use for getting dressed.  Remove throw rugs and tripping hazards from the floor. KITCHEN  Keep handles on pots and pans turned toward the center of the stove. Use back burners when possible.  Clean up spills quickly and allow time for drying.  Avoid walking on wet floors.  Avoid hot utensils and knives.  Position shelves so they are not too high or low.  Place commonly used objects within easy reach.  If necessary, use a sturdy step stool with  a grab bar when reaching.  Keep electrical cables out of the way.  Do not use floor polish or wax that makes floors slippery. If you must use wax, use non-skid floor wax.  Remove throw rugs and tripping hazards from the floor. STAIRWAYS  Never leave objects on stairs.  Place handrails on both sides of stairways and use them. Fix any loose handrails. Make sure handrails on both sides of the stairways are as long as the stairs.  Check carpeting to make sure it is firmly attached along stairs. Make repairs to worn or loose carpet promptly.  Avoid placing throw rugs at the top or bottom of stairways, or properly secure the rug with carpet tape to prevent slippage. Get rid of throw rugs, if possible.  Have an electrician put in a light switch at the top and bottom of the stairs. OTHER FALL PREVENTION TIPS  Wear low-heel or rubber-soled shoes that are supportive and fit well. Wear closed toe shoes.  When using a stepladder, make sure it is fully opened and both spreaders are firmly locked. Do not climb a closed stepladder.  Add color or contrast paint or tape to grab bars and handrails in your home. Place contrasting color strips on first and last steps.  Learn and use mobility aids as needed. Install an electrical emergency response system.  Turn on lights to avoid dark areas. Replace light bulbs that burn out immediately. Get light switches   Get light switches that glow.  Arrange furniture to create clear pathways. Keep furniture in the same place.  Firmly attach carpet with non-skid or double-sided tape.  Eliminate uneven floor surfaces.  Select a carpet pattern that does not visually hide the edge of steps.  Be aware of all pets. OTHER HOME SAFETY TIPS  Set the water temperature for 120 F (48.8 C).  Keep emergency numbers on or near the telephone.  Keep smoke detectors on every level of the home and near sleeping areas. Document Released: 08/24/2002 Document Revised: 03/04/2012  Document Reviewed: 11/23/2011 ExitCare Patient Information 2015 [employer]. This information is not intended to replace advice given to you by your health care provider. Make sure you discuss any questions you have with your health care provider.   Preventive Care for Adults Ages 65 and over  Blood pressure check.** / Every 1 to 2 years.  Lipid and cholesterol check.**/ Every 5 years beginning at age 20.  Lung cancer screening. / Every year if you are aged 55-80 years and have a 30-pack-year history of smoking and currently smoke or have quit within the past 15 years. Yearly screening is stopped once you have quit smoking for at least 15 years or develop a health problem that would prevent you from having lung cancer treatment.  Fecal occult blood test (FOBT) of stool. / Every year beginning at age 50 and continuing until age 75. You may not have to do this test if you get a colonoscopy every 10 years.  Flexible sigmoidoscopy** or colonoscopy.** / Every 5 years for a flexible sigmoidoscopy or every 10 years for a colonoscopy beginning at age 50 and continuing until age 75.  Hepatitis C blood test.** / For all people born from 1945 through 1965 and any individual with known risks for hepatitis C.  Abdominal aortic aneurysm (AAA) screening.** / A one-time screening for ages 65 to 75 years who are current or former smokers.  Skin self-exam. / Monthly.  Influenza vaccine. / Every year.  Tetanus, diphtheria, and acellular pertussis (Tdap/Td) vaccine.** / 1 dose of Td every 10 years.  Varicella vaccine.** / Consult your health care provider.  Zoster vaccine.** / 1 dose for adults aged 60 years or older.  Pneumococcal 13-valent conjugate (PCV13) vaccine.** / Consult your health care provider.  Pneumococcal polysaccharide (PPSV23) vaccine.** / 1 dose for all adults aged 65 years and older.  Meningococcal vaccine.** / Consult your health care provider.  Hepatitis A vaccine.** /  Consult your health care provider.  Hepatitis B vaccine.** / Consult your health care provider.  Haemophilus influenzae type b (Hib) vaccine.** / Consult your health care provider. **Family history and personal history of risk and conditions may change your health care provider's recommendations. Document Released: 10/30/2001 Document Revised: 09/08/2013 Document Reviewed: 01/29/2011 Exit[employer] Patient Information 2015 [employer]. This information is not intended to replace advice given to you by your health care provider. Make sure you discuss any questions you have with your health care provider.   

## 2016-04-02 NOTE — Progress Notes (Signed)
Subjective:    Patient ID: Jonathan Leather., male    DOB: 07-18-1936, 80 y.o.   MRN: [id number]  DOS:  04/02/2016 Type of visit - description :  Interval history:  Here for Medicare AWV:   1. Risk factors based on Past M, S, F history:reviewed   2. Physical Activities: active, does walk in the treadmil  and some lifting x 2-3 /week 3. Depression/mood: (-) screening 4. Hearing: no problems noted today, denies issues himself   5. ADL's: totally independent  , drives   6. Fall Risk: see instructions , no h/o falls  , prevention discussed  7. Home Safety: does feelsafe at home   8. Height, weight, &visual acuity: see VS, vision corrected w/ glasses, sees eye doctor regularly   9. Counseling: yes, see below   10. Labs ordered based on risk factors: yes   11. Referral Coordination, if needed   12. Care Plan, see assessment and plan  , written plan provided  13. Cognitive Assessment, motor skills, memory and cognition seem appropriate  14. Care team updated - sees Dr Meda Coffee 15. End of life care dicussed , encouraged to have a HC POA updated   additionally, we discussed the following issues [id number] med compliance, ambulatory BPs within normal DM: Sees endocrinology, last A1c 6.8. CAD- cards note reviewed, stable Just recently noted a new skin lesion, left armpit. No bleeding or discharge.     Review of Systems  Constitutional: No fever. No chills. No unexplained wt changes. No unusual sweats  HEENT: No dental problems, no ear discharge, no facial swelling, no voice changes. No eye discharge, no eye  redness , no  intolerance to light   Respiratory: No wheezing , no  difficulty breathing. No cough , no mucus production  Cardiovascular: No CP, no leg swelling , no  Palpitations  GI: no nausea, no vomiting, no diarrhea , no  abdominal pain.  No blood in the stools. No dysphagia, no odynophagia    Endocrine: No polyphagia, no polyuria , no polydipsia  GU: No dysuria, gross  hematuria, difficulty urinating. Some urgency during the daytime, + nocturia , 3 or 4 times at night for the last 2 years..  Musculoskeletal: No joint swellings or unusual aches or pains  Skin: No change in the color of the skin, palor , no  Rash  Allergic, immunologic: No environmental allergies , no  food allergies  Neurological: No dizziness no  syncope. No headaches. No diplopia, no slurred, no slurred speech, no motor deficits, no facial  Numbness  Hematological: No enlarged lymph nodes, no easy bruising , no unusual bleedings  Psychiatry: No suicidal ideas, no hallucinations, no beavior problems, no confusion.  No unusual/severe anxiety, no depression   Past Medical History  Diagnosis Date  . CAD (coronary artery disease)     Two RCA stents remotely / 3rd RCA stent 2006  . PVD (peripheral vascular disease) ([place])     s/p stents at LE 2009, Dr Einar Gip  . Colon polyps     s/p several Cscopes.  . Diabetes mellitus     dx aprox 2009  . Hyperlipidemia     dx in 90s  . Hypertension     dx in the 90  . ED (erectile dysfunction)     has a vacumm device  . Hypogonadism male   . Shortness of breath     O2 Sat dropped to 82% walking on the treadmill, September, 2012  . COPD (chronic  obstructive pulmonary disease) (HCC)     on O2, nocturnal  . Ejection fraction     Past Surgical History  Procedure Laterality Date  . Appendectomy    . Tonsillectomy      Social History   Social History  . Marital Status: Married    Spouse Name: N/A  . Number of Children: 6  . Years of Education: N/A   Occupational History  . 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 Smoking status: Former Smoker -- 2.00 packs/day for 30 years    Types: Cigarettes    Quit date: 06/17/1977  . Smokeless tobacco: Never Used     Comment: 2 ppd, quit 1987  . Alcohol Use: 0.0 oz/week    0 Standard drinks or equivalent per week     Comment: socially   . Drug Use: No  .  Sexual Activity: Not on file   Other Topics Concern  . Not on file   Social History Narrative   Has 2 step sons, and 4 children (lost 1 son)                     Family History  Problem Relation Age of Onset  . Heart disease Father   . Hypertension Father   . Stroke Father   . Diabetes Other     GM, nephews, many family members  . Hyperlipidemia Other     ?  . Colon cancer Neg Hx   . Prostate cancer Neg Hx   . Diabetes Paternal Aunt   . Diabetes Maternal Grandmother        Medication List       This list is accurate as of: 04/02/16 11:59 PM.  Always use your most recent med list.               amLODipine 10 MG tablet  Commonly known as:  NORVASC  TAKE 1 TABLET DAILY     aspirin 81 MG tablet  Take 81 mg by mouth daily.     clopidogrel 75 MG tablet  Commonly known as:  PLAVIX  Take 1 tablet (75 mg total) by mouth daily.     freestyle lancets  USE AS INSTRUCTED TO CHECK BLOOD SUGAR TWICE A DAY     FREESTYLE LITE test strip  Generic drug:  glucose blood  USE AS INSTRUCTED TO CHECK BLOOD SUGAR TWICE A DAY     glimepiride 4 MG tablet  Commonly known as:  AMARYL  TAKE ONE AND ONE-HALF TABLETS DAILY     losartan 100 MG tablet  Commonly known as:  COZAAR  Take 1 tablet (100 mg total) by mouth daily.     metFORMIN 1000 MG tablet  Commonly known as:  GLUCOPHAGE  TAKE 1 TABLET TWICE A DAY WITH MEALS     metoprolol succinate 100 MG 24 hr tablet  Commonly known as:  TOPROL XL  Take 1 tablet (100 mg total) by mouth daily.     multivitamin with minerals Tabs tablet  Take 1 tablet by mouth daily.     simvastatin 20 MG tablet  Commonly known as:  ZOCOR  Take 1 tablet (20 mg total) by mouth at bedtime.     SPIRIVA HANDIHALER 18 MCG inhalation capsule  Generic drug:  tiotropium  INHALE THE CONTENTS OF 1 CAPSULE DAILY     SYMBICORT 160-4.5 MCG/ACT inhaler  Generic drug:  budesonide-formoterol  USE  2 INHALATIONS TWICE A DAY     TANZEUM 50 MG Pen    Generic drug:  Albiglutide  INJECT THE CONTENTS OF 1 PEN EVERY WEEK     Testosterone 20.25 MG/ACT (1.62%) Gel  Place 4 application onto the skin daily.     triamterene-hydrochlorothiazide 37.5-25 MG tablet  Commonly known as:  MAXZIDE-25  Take 1 tablet by mouth daily.     VENTOLIN HFA 108 (90 Base) MCG/ACT inhaler  Generic drug:  albuterol  Inhale 2 puffs into the lungs every 6 (six) hours as needed for wheezing or shortness of breath.           Objective:   Physical Exam  Skin:      BP 124/76 mmHg  Pulse 71  Temp(Src) 97.5 F (36.4 C) (Oral)  Ht 6' (1.829 m)  Wt 255 lb 8 oz (115.894 kg)  BMI 34.64 kg/m2  SpO2 94%  General:   Well developed, well nourished . NAD.  Neck: No  thyromegaly  HEENT:  Normocephalic . Face symmetric, atraumatic Lungs:  CTA B Normal respiratory effort, no intercostal retractions, no accessory muscle use. Heart: RRR,  no murmur.  No pretibial edema bilaterally  Abdomen:  Not distended, soft, non-tender. No rebound or rigidity.   Rectal:  External abnormalities: none. Normal sphincter tone. No rectal masses or tenderness.  Stool brown  Prostate: Exam limited due to patient's habitus, prostate seems normal. Neurologic:  alert & oriented X3.  Speech normal, gait appropriate for age and unassisted Strength symmetric and appropriate for age.  Psych: Cognition and judgment appear intact.  Cooperative with normal attention span and concentration.  Behavior appropriate. No anxious or depressed appearing.    Assessment & Plan:    Assessment DM dx [id number], complicated by CAD, PVD, mild neuropathy. Sees Dr. Dwyane Dee Hypogonadism. Sees Dr. Dwyane Dee HTN Hyperlipidemia COPD, nocturnal oxygen prn CV: --CAD, Dr. Ron Parker Dr Meda Coffee -Peripheral vascular disease, stents lower extremity 2009  ED  PLAN HTN: Seems controlled, last BMP stable Hyperlipidemia: Continue simvastatin, check a FLP. Recent LFTs normal Hypogonadism: On HRT, DRE done  today, normal, check a PSA. Nocturia: Check a UA urine culture. CV-- Seems stable Elevated hemoglobin: Check a CBC Skin lesion: Just noted by the patient, needs excision, recommend to reschedule within a month. RTC 6

## 2016-04-02 NOTE — Assessment & Plan Note (Addendum)
Td 2016;  Pneumonia shot --2011; prevnar 2015; zostavax-- states he got it at [facility], no records   Colonoscopy-- h/o polyps in colon, s/p several Cscopes per pt, last  Colonoscopy 12/06/2014, no polyps, next in 7 years  DRE (-) 03-2016, on HRT, check a PSA   Diet-exercise discussed

## 2016-04-02 NOTE — Progress Notes (Signed)
Pre visit review using our clinic review tool, if applicable. No additional management support is needed unless otherwise documented below in the visit note. 

## 2016-04-03 ENCOUNTER — Telehealth: Payer: Self-pay | Admitting: *Deleted

## 2016-04-03 DIAGNOSIS — Z09 Encounter for follow-up examination after completed treatment for conditions other than malignant neoplasm: Secondary | ICD-10-CM | POA: Insufficient documentation

## 2016-04-03 LAB — CBC WITH DIFFERENTIAL/PLATELET
BASOS ABS: 0.1 10*3/uL (ref 0.0–0.1)
Basophils Relative: 0.6 % (ref 0.0–3.0)
EOS ABS: 0.3 10*3/uL (ref 0.0–0.7)
Eosinophils Relative: 3.3 % (ref 0.0–5.0)
HCT: 53.5 % — ABNORMAL HIGH (ref 39.0–52.0)
Lymphocytes Relative: 37.7 % (ref 12.0–46.0)
Lymphs Abs: 3.5 10*3/uL (ref 0.7–4.0)
MCHC: 33.6 g/dL (ref 30.0–36.0)
MCV: 98.6 fl (ref 78.0–100.0)
MONO ABS: 1 10*3/uL (ref 0.1–1.0)
Monocytes Relative: 11.2 % (ref 3.0–12.0)
Neutro Abs: 4.3 10*3/uL (ref 1.4–7.7)
Neutrophils Relative %: 47.2 % (ref 43.0–77.0)
Platelets: 208 10*3/uL (ref 150.0–400.0)
RBC: 5.43 Mil/uL (ref 4.22–5.81)
RDW: 15.3 % (ref 11.5–15.5)
WBC: 9.2 10*3/uL (ref 4.0–10.5)

## 2016-04-03 NOTE — Assessment & Plan Note (Signed)
HTN: Seems controlled, last BMP stable Hyperlipidemia: Continue simvastatin, check a FLP. Recent LFTs normal Hypogonadism: On HRT, DRE done today, normal, check a PSA. Nocturia: Check a UA urine culture. CV-- Seems stable Elevated hemoglobin: Check a CBC Skin lesion: Just noted by the patient, needs excision, recommend to reschedule within a month. RTC 6

## 2016-04-03 NOTE — Telephone Encounter (Signed)
Is okay, not a new issue.

## 2016-04-03 NOTE — Telephone Encounter (Signed)
error 

## 2016-04-03 NOTE — Telephone Encounter (Signed)
[facility] lab reporting critical . pts hemoglobin @ 18

## 2016-04-04 LAB — URINE CULTURE: Organism ID, Bacteria: NO GROWTH

## 2016-04-09 ENCOUNTER — Other Ambulatory Visit: Payer: Self-pay | Admitting: Endocrinology

## 2016-04-21 IMAGING — DX DG CHEST 2V
2 series · 2 of 2 positions shown · non-contrast
Comparison: 06/10/2014 and earlier.

CLINICAL DATA: 79-year-old male former smoker. Routine checkup.
Subsequent encounter.

EXAM:
CHEST  2 VIEW

[chest pa]
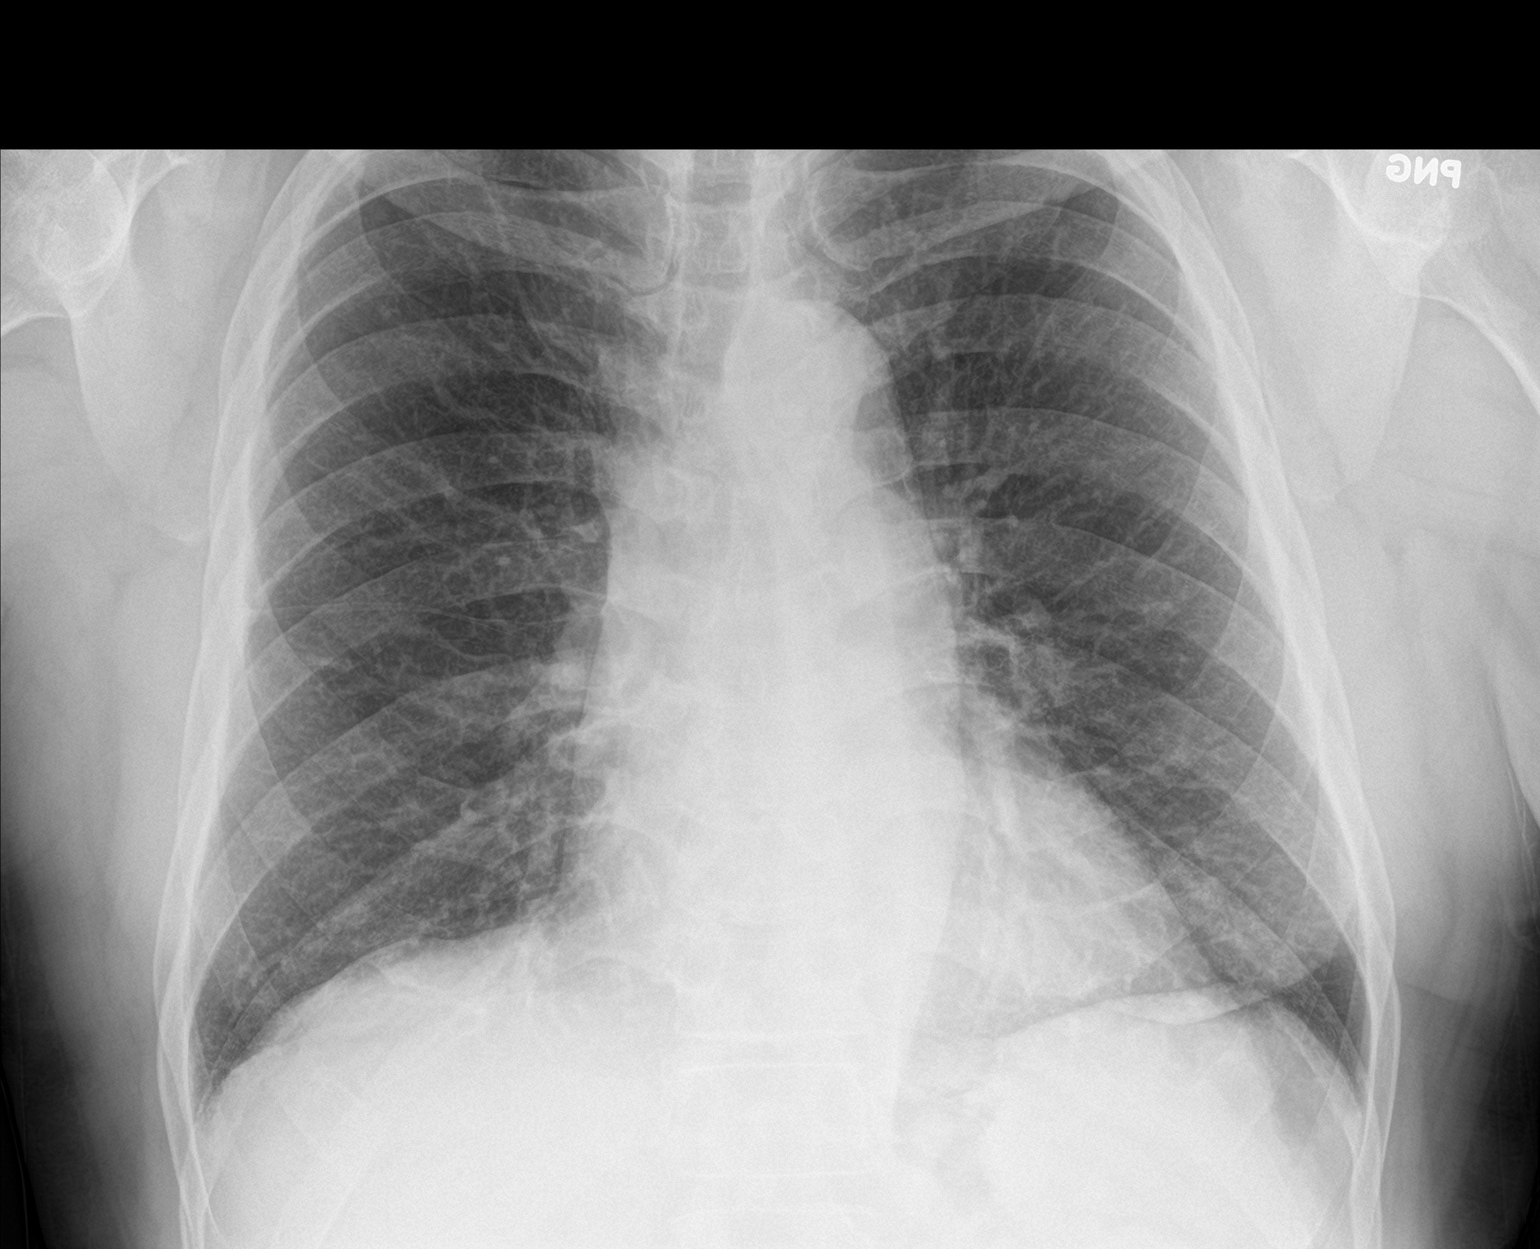

[chest lat]
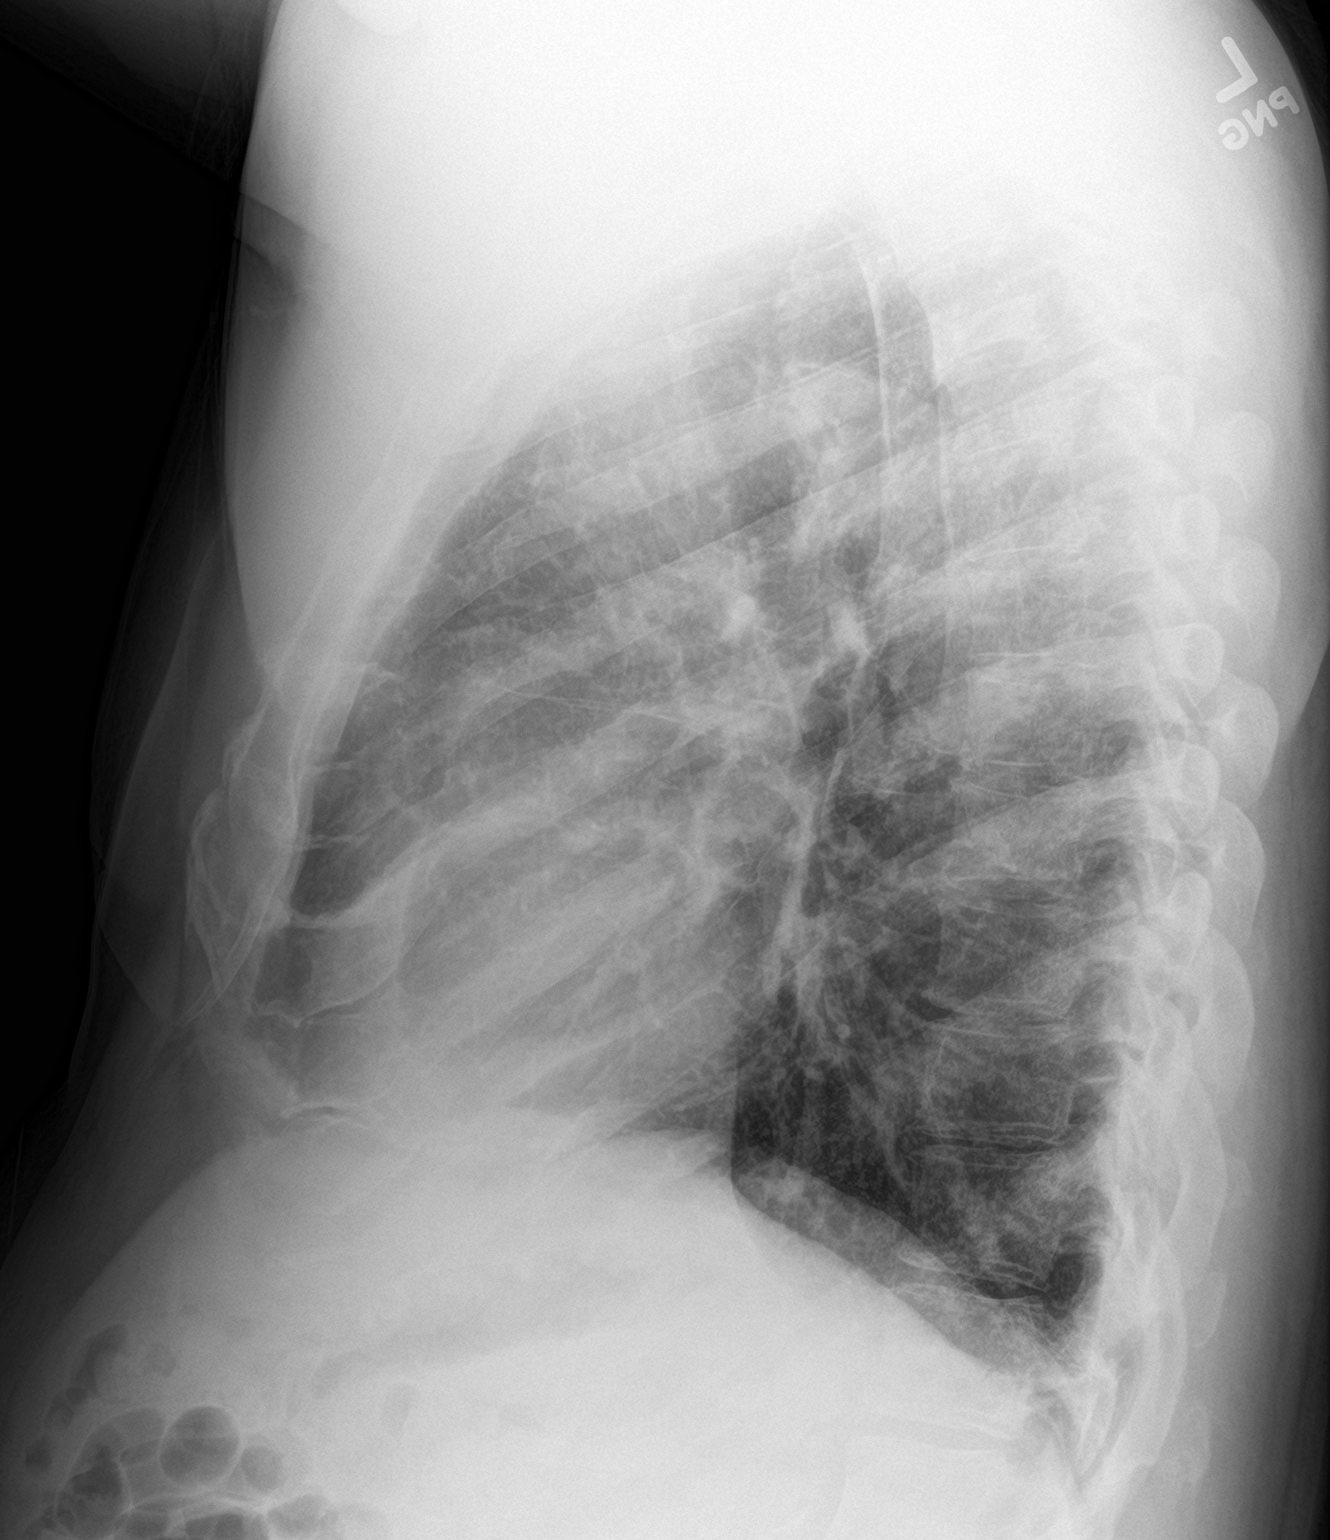

[2 of 2 positions shown; findings below may reference images not displayed]

FINDINGS: Chronic upper limits of normal to hyperinflated lung volumes are
stable since 2108. Stable mild cardiomegaly. Other mediastinal
contours are within normal limits. Visualized tracheal air column is
within normal limits. Mild right apical pulmonary scarring is
stable. No pneumothorax, pulmonary edema, pleural effusion or
confluent pulmonary opacity. Chronic increased interstitial markings
are stable. No definite pulmonary nodule. No acute osseous
abnormality identified.
IMPRESSION: No acute cardiopulmonary abnormality.

## 2016-04-23 ENCOUNTER — Other Ambulatory Visit (HOSPITAL_COMMUNITY)
Admission: RE | Admit: 2016-04-23 | Discharge: 2016-04-23 | Disposition: A | Discharge status: Home / Self Care | Payer: Medicare Other | Source: Ambulatory Visit | Attending: Internal Medicine | Admitting: Internal Medicine

## 2016-04-23 ENCOUNTER — Encounter: Payer: Self-pay | Admitting: Internal Medicine

## 2016-04-23 ENCOUNTER — Ambulatory Visit (INDEPENDENT_AMBULATORY_CARE_PROVIDER_SITE_OTHER): Payer: Medicare Other | Admitting: Internal Medicine

## 2016-04-23 VITALS — BP 124/62 | HR 78 | Temp 98.1°F | Resp 12 | Ht 72.0 in | Wt 254.5 lb

## 2016-04-23 DIAGNOSIS — Z09 Encounter for follow-up examination after completed treatment for conditions other than malignant neoplasm: Secondary | ICD-10-CM

## 2016-04-23 DIAGNOSIS — D229 Melanocytic nevi, unspecified: Secondary | ICD-10-CM | POA: Diagnosis not present

## 2016-04-23 DIAGNOSIS — L821 Other seborrheic keratosis: Secondary | ICD-10-CM | POA: Diagnosis not present

## 2016-04-23 DIAGNOSIS — L989 Disorder of the skin and subcutaneous tissue, unspecified: Secondary | ICD-10-CM | POA: Insufficient documentation

## 2016-04-23 NOTE — Patient Instructions (Signed)
Keep it covered with a Band-Aid  Keep the area clean and dry, avoid water for the next 2 or 3 days.  Call anytime if you see redness, swelling, discharge, fever, chills.

## 2016-04-23 NOTE — Progress Notes (Signed)
Pre visit review using our clinic review tool, if applicable. No additional management support is needed unless otherwise documented below in the visit note. 

## 2016-04-23 NOTE — Progress Notes (Signed)
Subjective:    Patient ID: Jonathan [name]., male    DOB: 09-19-1935, 80 y.o.   MRN: [id number]  DOS:  04/23/2016 Type of visit - description : Procedure visit Interval history:  Here for a excision  Review of Systems   Past Medical History:  Diagnosis Date  . CAD (coronary artery disease)    Two RCA stents remotely / 3rd RCA stent 2006  . Colon polyps    s/p several Cscopes.  Marland Kitchen COPD (chronic obstructive pulmonary disease) ([facility])    on O2, nocturnal  . Diabetes mellitus    dx aprox 2009  . ED (erectile dysfunction)    has a vacumm device  . Ejection fraction   . Hyperlipidemia    dx in 90s  . Hypertension    dx in the 90  . Hypogonadism male   . PVD (peripheral vascular disease) ([place])    s/p stents at LE 2009, Dr Einar Gip  . Shortness of breath    O2 Sat dropped to 82% walking on the treadmill, September, 2012    Past Surgical History:  Procedure Laterality Date  . APPENDECTOMY    . TONSILLECTOMY      Social History   Social History  . Marital status: Married    Spouse name: N/A  . Number of children: 6  . Years of education: N/A   Occupational History  . 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 Smoking status: Former Smoker    Packs/day: 2.00    Years: 30.00    Types: Cigarettes    Quit date: 06/17/1977  . Smokeless tobacco: Never Used     Comment: 2 ppd, quit 1987  . Alcohol use 0.0 oz/week     Comment: socially   . Drug use: No  . Sexual activity: Not on file   Other Topics Concern  . Not on file   Social History Narrative   Has 2 step sons, and 4 children (lost 1 son)                        Medication List       Accurate as of 04/23/16 12:05 PM. Always use your most recent med list.          amLODipine 10 MG tablet Commonly known as:  NORVASC TAKE 1 TABLET DAILY   aspirin 81 MG tablet Take 81 mg by mouth daily.   clopidogrel 75 MG tablet Commonly known as:  PLAVIX Take 1 tablet (75 mg  total) by mouth daily.   freestyle lancets USE AS INSTRUCTED TO CHECK BLOOD SUGAR TWICE A DAY   FREESTYLE LITE test strip Generic drug:  glucose blood USE AS INSTRUCTED TO CHECK BLOOD SUGAR TWICE A DAY   glimepiride 4 MG tablet Commonly known as:  AMARYL TAKE ONE AND ONE-HALF TABLETS DAILY   losartan 100 MG tablet Commonly known as:  COZAAR Take 1 tablet (100 mg total) by mouth daily.   metFORMIN 1000 MG tablet Commonly known as:  GLUCOPHAGE TAKE 1 TABLET TWICE A DAY WITH MEALS   metoprolol succinate 100 MG 24 hr tablet Commonly known as:  TOPROL XL Take 1 tablet (100 mg total) by mouth daily.   multivitamin with minerals Tabs tablet Take 1 tablet by mouth daily.   simvastatin 20 MG tablet Commonly known as:  ZOCOR Take 1 tablet (20 mg total) by mouth at bedtime.  SPIRIVA HANDIHALER 18 MCG inhalation capsule Generic drug:  tiotropium INHALE THE CONTENTS OF 1 CAPSULE DAILY   SYMBICORT 160-4.5 MCG/ACT inhaler Generic drug:  budesonide-formoterol USE 2 INHALATIONS TWICE A DAY   TANZEUM 50 MG Pen Generic drug:  Albiglutide INJECT THE CONTENTS OF 1 PEN EVERY WEEK   Testosterone 20.25 MG/ACT (1.62%) Gel Place 4 application onto the skin daily.   triamterene-hydrochlorothiazide 37.5-25 MG tablet Commonly known as:  MAXZIDE-25 Take 1 tablet by mouth daily.   VENTOLIN HFA 108 (90 Base) MCG/ACT inhaler Generic drug:  albuterol Inhale 2 puffs into the lungs every 6 (six) hours as needed for wheezing or shortness of breath.          Objective:   Physical Exam BP 124/62 (BP Location: Left Arm, Patient Position: Sitting, Cuff Size: Normal)   Pulse 78   Temp 98.1 F (36.7 C) (Oral)   Resp 12   Ht 6' (1.829 m)   Wt 254 lb 8 oz (115.4 kg)   SpO2 94%   BMI 34.52 kg/m  Skin exam unchanged from previous    Assessment & Plan:     Assessment DM dx [id number], complicated by CAD, PVD, mild neuropathy. Sees Dr. Dwyane Dee Hypogonadism. Sees Dr.  Dwyane Dee HTN Hyperlipidemia COPD, nocturnal oxygen prn CV: --CAD, Dr. Ron Parker Dr Meda Coffee -Peripheral vascular disease, stents lower extremity 2009  ED  PLAN Procedure note: In the sterile fashion and under a very small amount of lidocaine 2% without, we did a elliptical incision w/ scissors (size ~ 1 cm)  and remove the skin lesions from the left axilary area. Minimal blood loss, patient tolerated well, pathology sent. Area was cauterized with silver nitrate. Wound care discussed, to return to the office is redness, swelling, discharge.

## 2016-04-25 ENCOUNTER — Ambulatory Visit: Payer: Medicare Other | Admitting: Internal Medicine

## 2016-04-25 NOTE — Assessment & Plan Note (Addendum)
Procedure note: In the sterile fashion and under a very small amount of lidocaine 2% without, we did a elliptical incision w/ scissors (size ~ 1cm ) and remove the skin lesions from the left axilary area. Minimal blood loss, patient tolerated well, pathology sent. Area was cauterized with silver nitrate. Wound care discussed, to return to the office is redness, swelling, discharge.

## 2016-06-12 ENCOUNTER — Other Ambulatory Visit: Payer: Medicare Other

## 2016-06-15 ENCOUNTER — Ambulatory Visit: Payer: Medicare Other | Admitting: Endocrinology

## 2016-06-18 ENCOUNTER — Ambulatory Visit: Payer: Medicare Other | Admitting: Endocrinology

## 2016-06-19 ENCOUNTER — Ambulatory Visit (INDEPENDENT_AMBULATORY_CARE_PROVIDER_SITE_OTHER): Payer: Medicare Other

## 2016-06-19 DIAGNOSIS — Z23 Encounter for immunization: Secondary | ICD-10-CM | POA: Diagnosis not present

## 2016-06-28 ENCOUNTER — Encounter: Payer: Self-pay | Admitting: Podiatry

## 2016-06-28 ENCOUNTER — Ambulatory Visit (INDEPENDENT_AMBULATORY_CARE_PROVIDER_SITE_OTHER): Payer: Medicare Other | Admitting: Podiatry

## 2016-06-28 VITALS — BP 151/83 | HR 72

## 2016-06-28 DIAGNOSIS — M79673 Pain in unspecified foot: Secondary | ICD-10-CM

## 2016-06-28 DIAGNOSIS — M79672 Pain in left foot: Secondary | ICD-10-CM

## 2016-06-28 DIAGNOSIS — B351 Tinea unguium: Secondary | ICD-10-CM | POA: Diagnosis not present

## 2016-06-28 DIAGNOSIS — M79671 Pain in right foot: Secondary | ICD-10-CM

## 2016-06-28 NOTE — Patient Instructions (Signed)
Seen for hypertrophic nails. All nails debrided. Return in 3 months or as needed.  

## 2016-06-28 NOTE — Progress Notes (Signed)
Subjective:  80 year old Diabetic male presents requesting painful nails trimmed. Also has a painful corn on 5th digit right. He is wearing compression socks.  Objective:  Dermatologic: Thick dystrophic nails with fungal debris x 10.  Porokeratotic lesion over the 5th digit right symptomatic.  Vascular: All pedal pulses are palpable. Neurologic: All epicritic and tactile sensations grossly intact. Orthopedic: Contracted lesser digits bilateral with painful corn on 5th digit on right foot  Cavus type foot.   Assessment: Onychomycosis x 10.  Hammer toe deformities 2-5 bilateral.  Painful digital corns 5th right.   Treatment:  Debrided 5th digital corn and all nails x 10.  Return in 3 months or as needed.

## 2016-07-02 ENCOUNTER — Other Ambulatory Visit: Payer: Self-pay | Admitting: Endocrinology

## 2016-07-06 ENCOUNTER — Ambulatory Visit: Payer: Medicare Other | Admitting: Endocrinology

## 2016-07-06 ENCOUNTER — Other Ambulatory Visit: Payer: Medicare Other

## 2016-07-09 ENCOUNTER — Other Ambulatory Visit (INDEPENDENT_AMBULATORY_CARE_PROVIDER_SITE_OTHER): Payer: Medicare Other

## 2016-07-09 DIAGNOSIS — E119 Type 2 diabetes mellitus without complications: Secondary | ICD-10-CM

## 2016-07-09 LAB — BASIC METABOLIC PANEL
BUN: 24 mg/dL — AB (ref 6–23)
CHLORIDE: 107 meq/L (ref 96–112)
CO2: 25 meq/L (ref 19–32)
Calcium: 9.4 mg/dL (ref 8.4–10.5)
Creatinine, Ser: 1.47 mg/dL (ref 0.40–1.50)
GFR: 59.52 mL/min — AB (ref >60.00)
GLUCOSE: 159 mg/dL — AB (ref 70–99)
POTASSIUM: 3.9 meq/L (ref 3.5–5.1)
SODIUM: 140 meq/L (ref 135–145)

## 2016-07-09 LAB — HEMOGLOBIN A1C: Hgb A1c MFr Bld: 7.4 % — ABNORMAL HIGH (ref 4.6–6.5)

## 2016-07-11 ENCOUNTER — Ambulatory Visit (INDEPENDENT_AMBULATORY_CARE_PROVIDER_SITE_OTHER): Payer: Medicare Other | Admitting: Endocrinology

## 2016-07-11 ENCOUNTER — Encounter: Payer: Self-pay | Admitting: Endocrinology

## 2016-07-11 VITALS — BP 130/82 | HR 70 | Ht 72.0 in | Wt 257.0 lb

## 2016-07-11 DIAGNOSIS — E291 Testicular hypofunction: Secondary | ICD-10-CM | POA: Diagnosis not present

## 2016-07-11 DIAGNOSIS — E1165 Type 2 diabetes mellitus with hyperglycemia: Secondary | ICD-10-CM

## 2016-07-11 DIAGNOSIS — I2510 Atherosclerotic heart disease of native coronary artery without angina pectoris: Secondary | ICD-10-CM | POA: Diagnosis not present

## 2016-07-11 NOTE — Progress Notes (Signed)
Patient ID: Jonathan [name]., male   DOB: 11-27-1935, 80 y.o.   MRN: [id number]    Reason for Appointment:  Follow-up  History of Present Illness:          Diagnosis: Type 2 diabetes mellitus, date of diagnosis:  2009      Past history: He is not clear what his diabetes was diagnosed but according to hospital records it may have been in 2009. Most likely he was placed on metformin initially and at some point Amaryl added. In 2013 it was also given Januvia presumably to improve his control. However appears that his A1c has been consistently over 7% Janumet was started instead of metformin and Januvia separately in 2013  He was started on Trulicity in [id number] instead of Victoza because excessive bruising on his abdomen  Later in 01/2015 he was switched to Bydureon because of insurance noncoverage of his Trulicity  Recent history:   Oral hypoglycemic drugs the patient is taking are: Metformin 1000 once a day, Amaryl 4 mg 1/2 bid  Because of his complaining about excessive pain with Bydureon he was recommended Trulicity but since this was not covered by insurance he is now on Tanzeum 50 mg weekly since about 12/16  Now his A1c is higher at 7.4, previously 6.8  Current blood sugar patterns and problems:  He is checking his blood sugars primarily fasting and these are somewhat variable although not consistently high  He has only sporadic readings later in the day but difficult to analyze what time they are because of the wrong time on his monitor  Has only one reading over 200 in the morning but blood sugars appear to be overall higher than on the last visit  He says he has not been exercising regularly because of traveling  He claims that he is doing well on his diet but has gained some weight     Side effects from medications have been: None  Glucose monitoring:  done  Once a day         Glucometer:  FreeStyle Blood Glucose readings  by download  FASTING blood sugars  range 119-207, nonfasting 92-164 AVERAGE 137 Unable to analyze blood sugars as the time on the meter is about 4-5 hours behind   Glycemic control:  Lab Results  Component Value Date   HGBA1C 7.4 (H) 07/09/2016   HGBA1C 6.8 02/15/2016   HGBA1C 6.7 11/15/2015   Lab Results  Component Value Date   MICROALBUR 1.0 02/15/2016   LDLCALC 69 04/02/2016   CREATININE 1.47 07/09/2016    Self-care:      Meals: 3 meals per day. eating egg/meat breading for breakfast, dinner 5-6    Exercise: Going 3-4/7 with cardio around 50 minutes at the gym Dietician visit: Most recent: 4/16 .                  Weight history:  Wt Readings from Last 3 Encounters:  07/11/16 257 lb (116.6 kg)  04/23/16 254 lb 8 oz (115.4 kg)  04/02/16 255 lb 8 oz (115.9 kg)   Lab on 07/09/2016  Component Date Value Ref Range Status  . Sodium 07/09/2016 140  135 - 145 mEq/L Final  . Potassium 07/09/2016 3.9  3.5 - 5.1 mEq/L Final  . Chloride 07/09/2016 107  96 - 112 mEq/L Final  . CO2 07/09/2016 25  19 - 32 mEq/L Final  . Glucose, Bld 07/09/2016 159* 70 - 99 mg/dL Final  . BUN 07/09/2016  24* 6 - 23 mg/dL Final  . Creatinine, Ser 07/09/2016 1.47  0.40 - 1.50 mg/dL Final  . Calcium 07/09/2016 9.4  8.4 - 10.5 mg/dL Final  . GFR 07/09/2016 59.52* >60.00 mL/min Final  . Hgb A1c MFr Bld 07/09/2016 7.4* 4.6 - 6.5 % Final       Medication List       Accurate as of 07/11/16  8:23 AM. Always use your most recent med list.          amLODipine 10 MG tablet Commonly known as:  NORVASC TAKE 1 TABLET DAILY   aspirin 81 MG tablet Take 81 mg by mouth daily.   clopidogrel 75 MG tablet Commonly known as:  PLAVIX Take 1 tablet (75 mg total) by mouth daily.   freestyle lancets USE AS INSTRUCTED TO CHECK BLOOD SUGAR TWICE A DAY   FREESTYLE LITE test strip Generic drug:  glucose blood USE AS INSTRUCTED TO CHECK BLOOD SUGAR TWICE A DAY   glimepiride 4 MG tablet Commonly known as:  AMARYL TAKE ONE AND ONE-HALF  TABLETS DAILY   losartan 100 MG tablet Commonly known as:  COZAAR Take 1 tablet (100 mg total) by mouth daily.   metFORMIN 1000 MG tablet Commonly known as:  GLUCOPHAGE TAKE 1 TABLET TWICE A DAY WITH MEALS   metoprolol succinate 100 MG 24 hr tablet Commonly known as:  TOPROL XL Take 1 tablet (100 mg total) by mouth daily.   multivitamin with minerals Tabs tablet Take 1 tablet by mouth daily.   simvastatin 20 MG tablet Commonly known as:  ZOCOR Take 1 tablet (20 mg total) by mouth at bedtime.   SPIRIVA HANDIHALER 18 MCG inhalation capsule Generic drug:  tiotropium INHALE THE CONTENTS OF 1 CAPSULE DAILY   SYMBICORT 160-4.5 MCG/ACT inhaler Generic drug:  budesonide-formoterol USE 2 INHALATIONS TWICE A DAY   TANZEUM 50 MG Pen Generic drug:  Albiglutide INJECT THE CONTENTS OF 1 PEN EVERY WEEK   Testosterone 20.25 MG/ACT (1.62%) Gel Place 4 application onto the skin daily.   triamterene-hydrochlorothiazide 37.5-25 MG tablet Commonly known as:  MAXZIDE-25 Take 1 tablet by mouth daily.   VENTOLIN HFA 108 (90 Base) MCG/ACT inhaler Generic drug:  albuterol Inhale 2 puffs into the lungs every 6 (six) hours as needed for wheezing or shortness of breath.       Allergies: No Known Allergies  Past Medical History:  Diagnosis Date  . CAD (coronary artery disease)    Two RCA stents remotely / 3rd RCA stent 2006  . Colon polyps    s/p several Cscopes.  Marland Kitchen COPD (chronic obstructive pulmonary disease) (HCC)    on O2, nocturnal  . Diabetes mellitus    dx aprox 2009  . ED (erectile dysfunction)    has a vacumm device  . Ejection fraction   . Hyperlipidemia    dx in 90s  . Hypertension    dx in the 90  . Hypogonadism male   . PVD (peripheral vascular disease) ([place])    s/p stents at LE 2009, Dr Einar Gip  . Shortness of breath    O2 Sat dropped to 82% walking on the treadmill, September, 2012    Past Surgical History:  Procedure Laterality Date  . APPENDECTOMY    .  TONSILLECTOMY      Family History  Problem Relation Age of Onset  . Heart disease Father   . Hypertension Father   . Stroke Father   . Diabetes Paternal Aunt   . Diabetes  Maternal Grandmother   . Diabetes Other     GM, nephews, many family members  . Hyperlipidemia Other     ?  . Colon cancer Neg Hx   . Prostate cancer Neg Hx     Social History:  reports that he quit smoking about 39 years ago. His smoking use included Cigarettes. He has a 60.00 pack-year smoking history. He has never used smokeless tobacco. He reports that he drinks alcohol. He reports that he does not use drugs.    Review of Systems       Lipids: He has been treated with simvastatin 20 mg with good control       Lab Results  Component Value Date   CHOL 123 04/02/2016   HDL 35.10 (L) 04/02/2016   LDLCALC 69 04/02/2016   TRIG 91.0 04/02/2016   CHOLHDL 3 04/02/2016                      Has had erectile dysfunction for quite sometime.  Also had decreased libido previously and was found to have hypogonadism, probably in 2011. Original evaluation is not available.   He did have a slightly low free testosterone level in 2012 on treatment Prolactin level normal  Symptoms are controlled with AndroGel Without any fatigue  On his last visit his testosterone level was just over 300 He now has been compliant with the testosterone gel   Lab Results  Component Value Date   TESTOSTERONE 332.19 11/15/2015        HYPERTENSION: This is followed by PCP and is on 100 mg of losartan along with Maxide, amlodipine and metoprolol  Blood pressure Appears controlled now   LABS:  Lab on 07/09/2016  Component Date Value Ref Range Status  . Sodium 07/09/2016 140  135 - 145 mEq/L Final  . Potassium 07/09/2016 3.9  3.5 - 5.1 mEq/L Final  . Chloride 07/09/2016 107  96 - 112 mEq/L Final  . CO2 07/09/2016 25  19 - 32 mEq/L Final  . Glucose, Bld 07/09/2016 159* 70 - 99 mg/dL Final  . BUN 07/09/2016 24* 6 - 23 mg/dL  Final  . Creatinine, Ser 07/09/2016 1.47  0.40 - 1.50 mg/dL Final  . Calcium 07/09/2016 9.4  8.4 - 10.5 mg/dL Final  . GFR 07/09/2016 59.52* >60.00 mL/min Final  . Hgb A1c MFr Bld 07/09/2016 7.4* 4.6 - 6.5 % Final    Physical Examination:  BP 130/82   Pulse 70   Ht 6' (1.829 m)   Wt 257 lb (116.6 kg)   SpO2 91%   BMI 34.86 kg/m      ASSESSMENT/PLAN:   Diabetes type 2, with obesity See history of present illness for detailed discussion of current diabetes management, blood sugar patterns and problems identified  His A1c is Increasing and now 7.4 without change in his regimen He has gained little weight and may be not able to be consistent with exercise and diet with traveling more this summer Also difficult to analyze his blood sugar patterns as he is checking readings mostly fasting only and these are somewhat variable Discussed A1c results and need to be consistent with exercise and diet Discussed blood sugar targets and need to do at least half of his readings after meals He will also try to see if he can get Trulicity covered by his insurance instead of Tanzeum  Hypertension: Controlled.  He will follow-up with PCP  HYPOGONADISM: He will need follow-up levels on the next visit  There are no Patient Instructions on file for this visit.  Chais Fehringer 07/11/2016, 8:23 AM   Note: This office note was prepared with Dragon voice recognition system technology. Any transcriptional errors that result from this process are unintentional.

## 2016-07-11 NOTE — Patient Instructions (Addendum)
Check blood sugars on waking up 3x weekly    Also check blood sugars about 2 hours after a meal and do this after different meals by rotation  Recommended blood sugar levels on waking up is 90-130 and about 2 hours after meal is 130-160  Please bring your blood sugar monitor to each visit, thank you  Check coverage for Trulicity  Call if sugars stay high

## 2016-07-25 ENCOUNTER — Encounter: Payer: Self-pay | Admitting: Cardiology

## 2016-07-25 ENCOUNTER — Ambulatory Visit (INDEPENDENT_AMBULATORY_CARE_PROVIDER_SITE_OTHER): Payer: Medicare Other | Admitting: Cardiology

## 2016-07-25 VITALS — BP 134/74 | HR 77 | Ht 72.0 in | Wt 257.0 lb

## 2016-07-25 DIAGNOSIS — E784 Other hyperlipidemia: Secondary | ICD-10-CM | POA: Diagnosis not present

## 2016-07-25 DIAGNOSIS — N183 Chronic kidney disease, stage 3 unspecified: Secondary | ICD-10-CM

## 2016-07-25 DIAGNOSIS — I10 Essential (primary) hypertension: Secondary | ICD-10-CM | POA: Diagnosis not present

## 2016-07-25 DIAGNOSIS — E1122 Type 2 diabetes mellitus with diabetic chronic kidney disease: Secondary | ICD-10-CM

## 2016-07-25 DIAGNOSIS — E7849 Other hyperlipidemia: Secondary | ICD-10-CM

## 2016-07-25 DIAGNOSIS — I2510 Atherosclerotic heart disease of native coronary artery without angina pectoris: Secondary | ICD-10-CM

## 2016-07-25 DIAGNOSIS — E782 Mixed hyperlipidemia: Secondary | ICD-10-CM

## 2016-07-25 NOTE — Progress Notes (Signed)
Cardiology Office Note   Date:  07/25/2016   ID:  Jonathan [name]., DOB 1935/12/15, MRN [id number]  PCP:  Kathlene November, MD  Cardiologist:  Ena Dawley, MD , previously Dr. Ron Parker.  No chief complaint on file.  History of Present Illness: Jonathan [name]. is a 80 y.o. male , This is a very pleasant patient was previously followed by Dr. Ron Parker. He has history of COPD on home O2 that he uses only at night. He quit smoking in 1978. He has history of coronary artery disease with 2 stents placed into his RCA in 2006, prior to that he had chest pain that he hasn't experienced ever since. He had negative stress test in 2012 and his symptoms hasn't changed since. His only complaint today is shortness of breath and wheezing that he attributes to COPD. He otherwise denies palpitations or syncope. He goes to gym 4 times a week and exercises on treadmill for 40-50 minutes and denies any symptoms other than shortness of breath. He is also complaining of lower extremity cramping at night.  07/25/2016 - 6 months follow up, he feels great, he works as a Company secretary and travels a lot recently came from [place]. He denies any chest pain dizziness palpitations or syncope. He has stable dyspnea on exertion but is still able to go to gym on a regular basis. He has been compliant with his meds and has no side effects.  Past Medical History:  Diagnosis Date  . CAD (coronary artery disease)    Two RCA stents remotely / 3rd RCA stent 2006  . Colon polyps    s/p several Cscopes.  Marland Kitchen COPD (chronic obstructive pulmonary disease) (HCC)    on O2, nocturnal  . Diabetes mellitus    dx aprox 2009  . ED (erectile dysfunction)    has a vacumm device  . Ejection fraction   . Hyperlipidemia    dx in 90s  . Hypertension    dx in the 90  . Hypogonadism male   . PVD (peripheral vascular disease) ([place])    s/p stents at LE 2009, Dr Einar Gip  . Shortness of breath    O2 Sat dropped to 82% walking on the treadmill, September, 2012      Past Surgical History:  Procedure Laterality Date  . APPENDECTOMY    . TONSILLECTOMY      Patient Active Problem List   Diagnosis Date Noted  . PCP NOTES >>>>>>>>>>>>>>>>>>> 04/03/2016  . Coronary artery disease involving native coronary artery of native heart without angina pectoris 01/23/2016  . Cramp of both lower extremities 01/23/2016  . Hypogonadism male 12/16/2014  . Chronic kidney disease, stage II (mild) 02/28/2014  . Pain in lower limb ([place]) 01/08/2014  . Ejection fraction   . Pain in toe 04/10/2013  . Onychomycosis 04/10/2013  . Essential tremor 10/14/2012  . Annual physical exam 12/11/2011  . Vitamin D deficiency 12/11/2011  . Back pain 06/18/2011  . COPD mixed type ([place])   . CAD (coronary artery disease)   . PVD (peripheral vascular disease) ([place])   . HEADACHE 10/06/2010  . Hypogonadism in male 06/21/2010  . DMII (diabetes mellitus, type 2) ([place]) 03/17/2010  . Hyperlipidemia 03/17/2010  . Essential hypertension 03/17/2010      Current Outpatient Prescriptions  Medication Sig Dispense Refill  . amLODipine (NORVASC) 10 MG tablet TAKE 1 TABLET DAILY 90 tablet 3  . aspirin 81 MG tablet Take 81 mg by mouth daily.      Marland Kitchen  clopidogrel (PLAVIX) 75 MG tablet Take 1 tablet (75 mg total) by mouth daily. 90 tablet 1  . FREESTYLE LITE test strip USE AS INSTRUCTED TO CHECK BLOOD SUGAR TWICE A DAY 200 each 3  . glimepiride (AMARYL) 4 MG tablet TAKE ONE AND ONE-HALF TABLETS DAILY 135 tablet 1  . Lancets (FREESTYLE) lancets USE AS INSTRUCTED TO CHECK BLOOD SUGAR TWICE A DAY 200 each 1  . losartan (COZAAR) 100 MG tablet Take 1 tablet (100 mg total) by mouth daily. 90 tablet 1  . metFORMIN (GLUCOPHAGE) 1000 MG tablet TAKE 1 TABLET TWICE A DAY WITH MEALS 180 tablet 1  . metoprolol succinate (TOPROL XL) 100 MG 24 hr tablet Take 1 tablet (100 mg total) by mouth daily. 90 tablet 0  . Multiple Vitamin (MULTIVITAMIN WITH MINERALS) TABS tablet Take 1 tablet by mouth daily.    .  simvastatin (ZOCOR) 20 MG tablet Take 1 tablet (20 mg total) by mouth at bedtime. 90 tablet 0  . SPIRIVA HANDIHALER 18 MCG inhalation capsule INHALE THE CONTENTS OF 1 CAPSULE DAILY 90 capsule 3  . SYMBICORT 160-4.5 MCG/ACT inhaler USE 2 INHALATIONS TWICE A DAY 30.6 g 0  . TANZEUM 50 MG PEN INJECT THE CONTENTS OF 1 PEN EVERY WEEK 12 each 0  . Testosterone 20.25 MG/ACT (1.62%) GEL Place 4 application onto the skin daily. 225 g 3  . triamterene-hydrochlorothiazide (MAXZIDE-25) 37.5-25 MG tablet Take 1 tablet by mouth daily. 90 tablet 2   No current facility-administered medications for this visit.     Allergies:   Patient has no known allergies.   Social History:  The patient  reports that he quit smoking about 39 years ago. His smoking use included Cigarettes. He has a 60.00 pack-year smoking history. He has never used smokeless tobacco. He reports that he drinks alcohol. He reports that he does not use drugs.   Family History:  The patient's family history includes Diabetes in his maternal grandmother, other, and paternal aunt; Heart disease in his father; Hyperlipidemia in his other; Hypertension in his father; Stroke in his father.   ROS:  Please see the history of present illness.     Patient denies fever, chills, headache, sweats, rash, change in vision, change in hearing, chest pain, cough, nausea or vomiting, urinary symptoms. All other systems are reviewed and are negative.  PHYSICAL EXAM: VS:  BP 134/74   Pulse 77   Ht 6' (1.829 m)   Wt 257 lb (116.6 kg)   BMI 34.86 kg/m  , the patient is oriented to person time and place. Affect is normal. Head is atraumatic. Sclera and conjunctiva are normal. There is no jugular venous distention. Lungs are clear. Respiratory effort is nonlabored. Cardiac exam reveals an S1 and S2. There is very mild 2/6 systolic murmur. The abdomen is soft. There is no peripheral edema. There are no musculoskeletal deformities. There are no skin rashes. Neurologic  is grossly intact. He has good pulses bilaterally.  EKG:  Performed on 01/23/2016 shows sinus rhythm first-degree AV block otherwise normal EKG and unchanged from prior.   Recent Labs: 08/16/2015: TSH 2.25 02/15/2016: ALT 17 04/02/2016: Hemoglobin 18.0 Repeated and verified X2.; Platelets 208.0 07/09/2016: BUN 24; Creatinine, Ser 1.47; Potassium 3.9; Sodium 140   Lipid Panel    Component Value Date/Time   CHOL 123 04/02/2016 0951   TRIG 91.0 04/02/2016 0951   HDL 35.10 (L) 04/02/2016 0951   CHOLHDL 3 04/02/2016 0951   VLDL 18.2 04/02/2016 0951   LDLCALC  69 04/02/2016 0951    Wt Readings from Last 3 Encounters:  07/25/16 257 lb (116.6 kg)  07/11/16 257 lb (116.6 kg)  04/23/16 254 lb 8 oz (115.4 kg)     ASSESSMENT AND PLAN:  1. CAD - prior stents placed in 2006, negative stress test in 2012, he LVEF is 55%. We'll continue aspirin simvastatin, losartan. The patient has stable dyspnea on exertion secondary to COPD no ischemic workup is indicated at this point. His EKG today is unchanged from prior and shows sinus rhythm with first-degree AV block otherwise normal.  2. Essential hypertension - well controlled.  3. Hyperlipidemia - lipids at goal in July 2017, we'll continue the same dose of simvastatin 20 mg daily. No memory impairment.  4. CKD stage III with stable creatinine 1.4 in October 2017, we'll continue same dose of losartan.  5. Lower extremity cramping - patient is advised to buy over-the-counter magnesium, resolved.  Up in 6 months.  Ena Dawley, MD 07/25/2016

## 2016-07-25 NOTE — Patient Instructions (Signed)
Medication Instructions:   Your physician recommends that you continue on your current medications as directed. Please refer to the Current Medication list given to you today.    Follow-Up:  Your physician wants you to follow-up in: 6 months with DR Johann Capers will receive a reminder letter in the mail two months in advance. If you don't receive a letter, please call our office to schedule the follow-up appointment.        If you need a refill on your cardiac medications before your next appointment, please call your pharmacy.

## 2016-08-14 ENCOUNTER — Other Ambulatory Visit: Payer: Self-pay | Admitting: Endocrinology

## 2016-08-22 ENCOUNTER — Other Ambulatory Visit: Payer: Self-pay

## 2016-08-22 ENCOUNTER — Telehealth: Payer: Self-pay | Admitting: Endocrinology

## 2016-08-22 NOTE — Telephone Encounter (Signed)
Pt stated that he has spoken with [facility] and they told him to inform us to go ahead and send in the Trulicity.

## 2016-08-23 ENCOUNTER — Other Ambulatory Visit: Payer: Self-pay

## 2016-08-23 MED ORDER — DULAGLUTIDE 1.5 MG/0.5ML ~~LOC~~ SOAJ: SUBCUTANEOUS | 1 refills | Status: DC

## 2016-08-23 NOTE — Telephone Encounter (Signed)
He will take Trulicity 1.5 mg weekly, okay to send 90 day supply

## 2016-08-23 NOTE — Telephone Encounter (Signed)
Ordered 08/23/16

## 2016-09-27 ENCOUNTER — Ambulatory Visit (INDEPENDENT_AMBULATORY_CARE_PROVIDER_SITE_OTHER): Payer: Medicare Other | Admitting: Podiatry

## 2016-09-27 ENCOUNTER — Encounter: Payer: Self-pay | Admitting: Podiatry

## 2016-09-27 VITALS — BP 127/87 | HR 74

## 2016-09-27 DIAGNOSIS — M79671 Pain in right foot: Secondary | ICD-10-CM

## 2016-09-27 DIAGNOSIS — M2040 Other hammer toe(s) (acquired), unspecified foot: Secondary | ICD-10-CM | POA: Diagnosis not present

## 2016-09-27 DIAGNOSIS — M79672 Pain in left foot: Secondary | ICD-10-CM

## 2016-09-27 DIAGNOSIS — B351 Tinea unguium: Secondary | ICD-10-CM

## 2016-09-27 NOTE — Patient Instructions (Signed)
Seen for hypertrophic nails and painful corn 5th toe right. All nails and corns debrided. Return in 3 months or as needed.

## 2016-09-27 NOTE — Progress Notes (Signed)
Subjective:  81 year old Diabetic male presents requesting painful nails trimmed. Also has a painful corn on 5th digit right.  Objective:  Dermatologic: Thick dystrophic nails with fungal debris x 10.  Porokeratotic lesion over the 5th digit right symptomatic.  Vascular: All pedal pulses are palpable. Neurologic: All epicritic and tactile sensations grossly intact. Orthopedic: Contracted lesser digits bilateral with painful corn on 5th digit on right foot  Cavus type foot.   Assessment: Onychomycosis x 10.  Hammer toe deformities 2-5 bilateral.  Painful digital corns 5th right.   Treatment:  Debrided 5th digital corn and all nails x 10.  Return in 3 months or as needed.

## 2016-10-03 ENCOUNTER — Ambulatory Visit: Payer: Medicare Other | Admitting: Internal Medicine

## 2016-10-05 ENCOUNTER — Ambulatory Visit: Payer: Medicare Other | Admitting: Internal Medicine

## 2016-10-08 ENCOUNTER — Encounter: Payer: Self-pay | Admitting: Internal Medicine

## 2016-10-08 ENCOUNTER — Ambulatory Visit (INDEPENDENT_AMBULATORY_CARE_PROVIDER_SITE_OTHER): Payer: Medicare Other | Admitting: Internal Medicine

## 2016-10-08 ENCOUNTER — Other Ambulatory Visit (INDEPENDENT_AMBULATORY_CARE_PROVIDER_SITE_OTHER): Payer: Medicare Other

## 2016-10-08 ENCOUNTER — Telehealth: Payer: Self-pay | Admitting: Endocrinology

## 2016-10-08 VITALS — BP 128/78 | HR 78 | Temp 98.4°F | Resp 14 | Ht 72.0 in | Wt 259.1 lb

## 2016-10-08 DIAGNOSIS — N529 Male erectile dysfunction, unspecified: Secondary | ICD-10-CM

## 2016-10-08 DIAGNOSIS — E1165 Type 2 diabetes mellitus with hyperglycemia: Secondary | ICD-10-CM | POA: Diagnosis not present

## 2016-10-08 DIAGNOSIS — E291 Testicular hypofunction: Secondary | ICD-10-CM | POA: Diagnosis not present

## 2016-10-08 DIAGNOSIS — E1159 Type 2 diabetes mellitus with other circulatory complications: Secondary | ICD-10-CM

## 2016-10-08 LAB — COMPREHENSIVE METABOLIC PANEL
ALBUMIN: 3.8 g/dL (ref 3.5–5.2)
ALK PHOS: 58 U/L (ref 39–117)
ALT: 21 U/L (ref 0–53)
AST: 16 U/L (ref 0–37)
BUN: 17 mg/dL (ref 6–23)
CHLORIDE: 105 meq/L (ref 96–112)
CO2: 25 mEq/L (ref 19–32)
Calcium: 9.5 mg/dL (ref 8.4–10.5)
Creatinine, Ser: 1.45 mg/dL (ref 0.40–1.50)
GFR: 60.43 mL/min (ref >60.00)
Glucose, Bld: 153 mg/dL — ABNORMAL HIGH (ref 70–99)
POTASSIUM: 3.8 meq/L (ref 3.5–5.1)
SODIUM: 139 meq/L (ref 135–145)
TOTAL PROTEIN: 7.1 g/dL (ref 6.0–8.3)
Total Bilirubin: 0.5 mg/dL (ref 0.2–1.2)

## 2016-10-08 LAB — HEMOGLOBIN A1C: Hgb A1c MFr Bld: 7.7 % — ABNORMAL HIGH (ref 4.6–6.5)

## 2016-10-08 LAB — TESTOSTERONE: TESTOSTERONE: 433.04 ng/dL (ref 300.00–890.00)

## 2016-10-08 MED ORDER — DULAGLUTIDE 1.5 MG/0.5ML ~~LOC~~ SOAJ: SUBCUTANEOUS | 1 refills | Status: DC

## 2016-10-08 NOTE — Telephone Encounter (Signed)
Refill submitted. 

## 2016-10-08 NOTE — Progress Notes (Signed)
Subjective:    Patient ID: Jonathan Leather., male    DOB: 1936-04-27, 81 y.o.   MRN: [id number]  DOS:  10/08/2016 Type of visit - description : f/u Interval history: In general feeling well, except for erectile dysfunction. This is going on for 2 years, worse lately, in fact he has not been able to get an erection the last few times he try. Note from cardiology reviewed, stable.   Review of Systems  Denies chest pain, difficulty breathing at baseline. No claudication  Past Medical History:  Diagnosis Date  . CAD (coronary artery disease)    Two RCA stents remotely / 3rd RCA stent 2006  . Colon polyps    s/p several Cscopes.  Marland Kitchen COPD (chronic obstructive pulmonary disease) (HCC)    on O2, nocturnal  . Diabetes mellitus    dx aprox 2009  . ED (erectile dysfunction)    has a vacumm device  . Ejection fraction   . Hyperlipidemia    dx in 90s  . Hypertension    dx in the 90  . Hypogonadism male   . PVD (peripheral vascular disease) ([place])    s/p stents at [facility] 2009, Dr Einar Gip  . Shortness of breath    O2 Sat dropped to 82% walking on the treadmill, September, 2012    Past Surgical History:  Procedure Laterality Date  . APPENDECTOMY    . TONSILLECTOMY      Social History   Social History  . Marital status: Married    Spouse name: Cerrella   . Number of children: 6  . Years of education: N/A   Occupational History  . 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 Smoking status: Former Smoker    Packs/day: 2.00    Years: 30.00    Types: Cigarettes    Quit date: 06/17/1977  . Smokeless tobacco: Never Used     Comment: 2 ppd, quit 1987  . Alcohol use 0.0 oz/week     Comment: socially   . Drug use: No  . Sexual activity: Not on file   Other Topics Concern  . Not on file   Social History Narrative   Has 2 step sons, and 4 children (lost 1 son)                      Allergies as of 10/08/2016   No Known Allergies       Medication List       Accurate as of 10/08/16  6:36 PM. Always use your most recent med list.          amLODipine 10 MG tablet Commonly known as:  NORVASC TAKE 1 TABLET DAILY   aspirin 81 MG tablet Take 81 mg by mouth daily.   clopidogrel 75 MG tablet Commonly known as:  PLAVIX Take 1 tablet (75 mg total) by mouth daily.   freestyle lancets USE AS INSTRUCTED TO CHECK BLOOD SUGAR TWICE A DAY   FREESTYLE LITE test strip Generic drug:  glucose blood USE AS INSTRUCTED TO CHECK BLOOD SUGAR TWICE A DAY   glimepiride 4 MG tablet Commonly known as:  AMARYL TAKE ONE AND ONE-HALF TABLETS DAILY   losartan 100 MG tablet Commonly known as:  COZAAR Take 1 tablet (100 mg total) by mouth daily.   metFORMIN 1000 MG tablet Commonly known as:  GLUCOPHAGE TAKE 1 TABLET TWICE A DAY WITH MEALS  metoprolol succinate 100 MG 24 hr tablet Commonly known as:  TOPROL XL Take 1 tablet (100 mg total) by mouth daily.   multivitamin with minerals Tabs tablet Take 1 tablet by mouth daily.   simvastatin 20 MG tablet Commonly known as:  ZOCOR Take 1 tablet (20 mg total) by mouth at bedtime.   SPIRIVA HANDIHALER 18 MCG inhalation capsule Generic drug:  tiotropium INHALE THE CONTENTS OF 1 CAPSULE DAILY   SYMBICORT 160-4.5 MCG/ACT inhaler Generic drug:  budesonide-formoterol USE 2 INHALATIONS TWICE A DAY   TANZEUM 50 MG Pen Generic drug:  Albiglutide INJECT THE CONTENTS OF 1 PEN EVERY WEEK   Testosterone 20.25 MG/ACT (1.62%) Gel Place 4 application onto the skin daily.   triamterene-hydrochlorothiazide 37.5-25 MG tablet Commonly known as:  MAXZIDE-25 Take 1 tablet by mouth daily.          Objective:   Physical Exam BP 128/78 (BP Location: Left Arm, Patient Position: Sitting, Cuff Size: Normal)   Pulse 78   Temp 98.4 F (36.9 C) (Oral)   Resp 14   Ht 6' (1.829 m)   Wt 259 lb 2 oz (117.5 kg)   SpO2 93%   BMI 35.14 kg/m  General:   Well developed, well nourished . NAD.     HEENT:  Normocephalic . Face symmetric, atraumatic Lungs:  CTA B Normal respiratory effort, no intercostal retractions, no accessory muscle use. Heart: RRR,  no murmur.  No pretibial edema bilaterally  LE: Femoral pulses: Positive but slightly decreased symmetrically. Normal pedal pulses. DIABETIC FEET EXAM: Skin normal, nails dystrophic Pinprick examination: Decreased feeling, plantar aspect bilaterally, distally Neurologic:  alert & oriented X3.  Speech normal, gait appropriate for age and unassisted Strength symmetric and appropriate for age.  Psych: Cognition and judgment appear intact.  Cooperative with normal attention span and concentration.  Behavior appropriate. No anxious or depressed appearing.    Assessment & Plan:   Assessment DM dx [id number], complicated by CAD, PVD, mild neuropathy. Sees Dr. Dwyane Dee Hypogonadism. Sees Dr. Dwyane Dee HTN Hyperlipidemia COPD, nocturnal O2 prn CV: -CAD, Dr. Ron Parker Dr Meda Coffee -Peripheral vascular disease, stents lower extremity 2009  ED  PLAN E.D.: Erectile dysfunction was the main concern for the patient today, he thinks is related to one of the many medications however most likely is a combination of aging, diabetes and probably some degree of vascular insufficiency. Long discussion about Rx options: re-try Viagra (he is somewhat hesitant),  MUSE, vacuum device, etc. At the end, we agreed to refer to urology for further advise. DM with neuropathy: + Neuropathy on exam today, mild, feet care discussed RTC 4-5 months, routine checkup

## 2016-10-08 NOTE — Patient Instructions (Signed)
GO TO THE FRONT DESK Schedule your next appointment for a  Check up in 4 to 5 months     Diabetes and Foot Care Diabetes may cause you to have problems because of poor blood supply (circulation) to your feet and legs. This may cause the skin on your feet to become thinner, break easier, and heal more slowly. Your skin may become dry, and the skin may peel and crack. You may also have nerve damage in your legs and feet causing decreased feeling in them. You may not notice minor injuries to your feet that could lead to infections or more serious problems. Taking care of your feet is one of the most important things you can do for yourself. Follow these instructions at home:  Wear shoes at all times, even in the house. Do not go barefoot. Bare feet are easily injured.  Check your feet daily for blisters, cuts, and redness. If you cannot see the bottom of your feet, use a mirror or ask someone for help.  Wash your feet with warm water (do not use hot water) and mild soap. Then pat your feet and the areas between your toes until they are completely dry. Do not soak your feet as this can dry your skin.  Apply a moisturizing lotion or petroleum jelly (that does not contain alcohol and is unscented) to the skin on your feet and to dry, brittle toenails. Do not apply lotion between your toes.  Trim your toenails straight across. Do not dig under them or around the cuticle. File the edges of your nails with an emery board or nail file.  Do not cut corns or calluses or try to remove them with medicine.  Wear clean socks or stockings every day. Make sure they are not too tight. Do not wear knee-high stockings since they may decrease blood flow to your legs.  Wear shoes that fit properly and have enough cushioning. To break in new shoes, wear them for just a few hours a day. This prevents you from injuring your feet. Always look in your shoes before you put them on to be sure there are no objects  inside.  Do not cross your legs. This may decrease the blood flow to your feet.  If you find a minor scrape, cut, or break in the skin on your feet, keep it and the skin around it clean and dry. These areas may be cleansed with mild soap and water. Do not cleanse the area with peroxide, alcohol, or iodine.  When you remove an adhesive bandage, be sure not to damage the skin around it.  If you have a wound, look at it several times a day to make sure it is healing.  Do not use heating pads or hot water bottles. They may burn your skin. If you have lost feeling in your feet or legs, you may not know it is happening until it is too late.  Make sure your health care provider performs a complete foot exam at least annually or more often if you have foot problems. Report any cuts, sores, or bruises to your health care provider immediately. Contact a health care provider if:  You have an injury that is not healing.  You have cuts or breaks in the skin.  You have an ingrown nail.  You notice redness on your legs or feet.  You feel burning or tingling in your legs or feet.  You have pain or cramps in your legs and  feet.  Your legs or feet are numb.  Your feet always feel cold. Get help right away if:  There is increasing redness, swelling, or pain in or around a wound.  There is a red line that goes up your leg.  Pus is coming from a wound.  You develop a fever or as directed by your health care provider.  You notice a bad smell coming from an ulcer or wound. This information is not intended to replace advice given to you by your health care provider. Make sure you discuss any questions you have with your health care provider. Document Released: 08/31/2000 Document Revised: 02/09/2016 Document Reviewed: 02/10/2013 Elsevier Interactive Patient Education  2017 [employer].

## 2016-10-08 NOTE — Telephone Encounter (Signed)
Patient need a refill of Trulicity,[facility]

## 2016-10-08 NOTE — Progress Notes (Signed)
Pre visit review using our clinic review tool, if applicable. No additional management support is needed unless otherwise documented below in the visit note. 

## 2016-10-08 NOTE — Assessment & Plan Note (Signed)
E.D.: Erectile dysfunction was the main concern for the patient today, he thinks is related to one of the many medications however most likely is a combination of aging, diabetes and probably some degree of vascular insufficiency. Long discussion about Rx options: re-try Viagra (he is somewhat hesitant),  MUSE, vacuum device, etc. At the end, we agreed to refer to urology for further advise. DM with neuropathy: + Neuropathy on exam today, mild, feet care discussed RTC 4-5 months, routine checkup

## 2016-10-09 ENCOUNTER — Telehealth: Payer: Self-pay | Admitting: Internal Medicine

## 2016-10-09 NOTE — Telephone Encounter (Signed)
Pt called in he says that he has seen yesterday and forgot to mention to provider that he has been having lower back pain. Pt would like to request a back brace.          CB: [phone]

## 2016-10-09 NOTE — Telephone Encounter (Signed)
Received paperwork for back brace, however, questions on forms that can not be answered since Pt did not speak with PCP about problem. Recommend he schedule another OV to discuss back issues.

## 2016-10-10 NOTE — Telephone Encounter (Signed)
Noted  

## 2016-10-10 NOTE — Telephone Encounter (Signed)
Pt has been scheduled. He will be in tomorrow morning at 8:45

## 2016-10-11 ENCOUNTER — Ambulatory Visit: Payer: Medicare Other | Admitting: Internal Medicine

## 2016-10-11 ENCOUNTER — Encounter: Payer: Self-pay | Admitting: Endocrinology

## 2016-10-11 ENCOUNTER — Ambulatory Visit (INDEPENDENT_AMBULATORY_CARE_PROVIDER_SITE_OTHER): Payer: Medicare Other | Admitting: Endocrinology

## 2016-10-11 VITALS — BP 138/80 | HR 65 | Ht 72.0 in | Wt 256.0 lb

## 2016-10-11 DIAGNOSIS — E1165 Type 2 diabetes mellitus with hyperglycemia: Secondary | ICD-10-CM

## 2016-10-11 DIAGNOSIS — I10 Essential (primary) hypertension: Secondary | ICD-10-CM | POA: Diagnosis not present

## 2016-10-11 DIAGNOSIS — E291 Testicular hypofunction: Secondary | ICD-10-CM

## 2016-10-11 MED ORDER — DULAGLUTIDE 1.5 MG/0.5ML ~~LOC~~ SOAJ: SUBCUTANEOUS | 1 refills | Status: DC

## 2016-10-11 NOTE — Progress Notes (Signed)
Patient ID: Jonathan [name]., male   DOB: May 10, 1936, 81 y.o.   MRN: [id number]    Reason for Appointment:  Follow-up  History of Present Illness:          Diagnosis: Type 2 diabetes mellitus, date of diagnosis:  2009      Past history: He is not clear what his diabetes was diagnosed but according to hospital records it may have been in 2009. Most likely he was placed on metformin initially and at some point Amaryl added. In 2013 it was also given Januvia presumably to improve his control. However appears that his A1c has been consistently over 7% Janumet was started instead of metformin and Januvia separately in 2013  He was started on Trulicity in [id number] instead of Victoza because excessive bruising on his abdomen  Later in 01/2015 he was switched to Bydureon because of insurance noncoverage of his Trulicity  Recent history:   Oral hypoglycemic drugs the patient is taking are: Metformin 1000 a day, Amaryl 4 mg 1/2 bid  Because of his complaining about excessive pain with Bydureon he was recommended Trulicity but since this was not covered by insurance he is on Tanzeum 50 mg weekly since about 12/16  Now his A1c is progressively higher at 7.7, previously 7.4  Current blood sugar patterns and problems:  He is checking his blood sugars at various times of the day recently  He thinks his blood sugars are probably higher periodically especially the last 3 weeks because of his traveling and not being able to be consistent diet  However he continues to try and exercise 3-4 days a week  Not clear why he is taking metformin only once a day, his prescription is for twice a day  He has been recommended Trulicity and apparently this still needs prior authorization     Side effects from medications have been: None  Glucose monitoring:  done  Once a day         Glucometer:  FreeStyle Blood Glucose readings  by download  Mean values apply above for all meters except median for  One Touch  PRE-MEAL Fasting Lunch Dinner Bedtime Overall  Glucose range: 118-199  111  96-171  88-217    Mean/median: 145   124  140  142    Glycemic control:  Lab Results  Component Value Date   HGBA1C 7.7 (H) 10/08/2016   HGBA1C 7.4 (H) 07/09/2016   HGBA1C 6.8 02/15/2016   Lab Results  Component Value Date   MICROALBUR 1.0 02/15/2016   LDLCALC 69 04/02/2016   CREATININE 1.45 10/08/2016    Self-care:      Meals: 3 meals per day. eating egg/meat breading for breakfast, dinner 5-6    Exercise: Going 3-4/7 with cardio around 50 minutes at the gym Dietician visit: Most recent: 4/16 .                  Weight history:  Wt Readings from Last 3 Encounters:  10/11/16 256 lb (116.1 kg)  10/08/16 259 lb 2 oz (117.5 kg)  07/25/16 257 lb (116.6 kg)   Lab on 10/08/2016  Component Date Value Ref Range Status  . Hgb A1c MFr Bld 10/08/2016 7.7* 4.6 - 6.5 % Final  . Sodium 10/08/2016 139  135 - 145 mEq/L Final  . Potassium 10/08/2016 3.8  3.5 - 5.1 mEq/L Final  . Chloride 10/08/2016 105  96 - 112 mEq/L Final  . CO2 10/08/2016 25  19 -  32 mEq/L Final  . Glucose, Bld 10/08/2016 153* 70 - 99 mg/dL Final  . BUN 10/08/2016 17  6 - 23 mg/dL Final  . Creatinine, Ser 10/08/2016 1.45  0.40 - 1.50 mg/dL Final  . Total Bilirubin 10/08/2016 0.5  0.2 - 1.2 mg/dL Final  . Alkaline Phosphatase 10/08/2016 58  39 - 117 U/L Final  . AST 10/08/2016 16  0 - 37 U/L Final  . ALT 10/08/2016 21  0 - 53 U/L Final  . Total Protein 10/08/2016 7.1  6.0 - 8.3 g/dL Final  . Albumin 10/08/2016 3.8  3.5 - 5.2 g/dL Final  . Calcium 10/08/2016 9.5  8.4 - 10.5 mg/dL Final  . GFR 10/08/2016 60.43  >60.00 mL/min Final  . Testosterone 10/08/2016 433.04  300.00 - 890.00 ng/dL Final     Allergies as of 10/11/2016   No Known Allergies     Medication List       Accurate as of 10/11/16  8:50 AM. Always use your most recent med list.          amLODipine 10 MG tablet Commonly known as:  NORVASC TAKE 1 TABLET  DAILY   aspirin 81 MG tablet Take 81 mg by mouth daily.   clopidogrel 75 MG tablet Commonly known as:  PLAVIX Take 1 tablet (75 mg total) by mouth daily.   Dulaglutide 1.5 MG/0.5ML Sopn Commonly known as:  TRULICITY Inject weekly   freestyle lancets USE AS INSTRUCTED TO CHECK BLOOD SUGAR TWICE A DAY   FREESTYLE LITE test strip Generic drug:  glucose blood USE AS INSTRUCTED TO CHECK BLOOD SUGAR TWICE A DAY   glimepiride 4 MG tablet Commonly known as:  AMARYL TAKE ONE AND ONE-HALF TABLETS DAILY   losartan 100 MG tablet Commonly known as:  COZAAR Take 1 tablet (100 mg total) by mouth daily.   metFORMIN 1000 MG tablet Commonly known as:  GLUCOPHAGE TAKE 1 TABLET TWICE A DAY WITH MEALS   metoprolol succinate 100 MG 24 hr tablet Commonly known as:  TOPROL XL Take 1 tablet (100 mg total) by mouth daily.   multivitamin with minerals Tabs tablet Take 1 tablet by mouth daily.   simvastatin 20 MG tablet Commonly known as:  ZOCOR Take 1 tablet (20 mg total) by mouth at bedtime.   SPIRIVA HANDIHALER 18 MCG inhalation capsule Generic drug:  tiotropium INHALE THE CONTENTS OF 1 CAPSULE DAILY   SYMBICORT 160-4.5 MCG/ACT inhaler Generic drug:  budesonide-formoterol USE 2 INHALATIONS TWICE A DAY   TANZEUM 50 MG Pen Generic drug:  Albiglutide INJECT THE CONTENTS OF 1 PEN EVERY WEEK   Testosterone 20.25 MG/ACT (1.62%) Gel Place 4 application onto the skin daily.   triamterene-hydrochlorothiazide 37.5-25 MG tablet Commonly known as:  MAXZIDE-25 Take 1 tablet by mouth daily.       Allergies: No Known Allergies  Past Medical History:  Diagnosis Date  . CAD (coronary artery disease)    Two RCA stents remotely / 3rd RCA stent 2006  . Colon polyps    s/p several Cscopes.  Marland Kitchen COPD (chronic obstructive pulmonary disease) ([facility])    on O2, nocturnal  . Diabetes mellitus    dx aprox 2009  . ED (erectile dysfunction)    has a vacumm device  . Ejection fraction   .  Hyperlipidemia    dx in 90s  . Hypertension    dx in the 90  . Hypogonadism male   . PVD (peripheral vascular disease) ([facility])    s/p stents  at [facility] 2009, Dr Einar Gip  . Shortness of breath    O2 Sat dropped to 82% walking on the treadmill, September, 2012    Past Surgical History:  Procedure Laterality Date  . APPENDECTOMY    . TONSILLECTOMY      Family History  Problem Relation Age of Onset  . Heart disease Father   . Hypertension Father   . Stroke Father   . Diabetes Paternal Aunt   . Diabetes Maternal Grandmother   . Diabetes Other     GM, nephews, many family members  . Hyperlipidemia Other     ?  . Colon cancer Neg Hx   . Prostate cancer Neg Hx     Social History:  reports that he quit smoking about 39 years ago. His smoking use included Cigarettes. He has a 60.00 pack-year smoking history. He has never used smokeless tobacco. He reports that he drinks alcohol. He reports that he does not use drugs.    Review of Systems       Lipids: He has been treated with simvastatin 20 mg with good control       Lab Results  Component Value Date   CHOL 123 04/02/2016   HDL 35.10 (L) 04/02/2016   LDLCALC 69 04/02/2016   TRIG 91.0 04/02/2016   CHOLHDL 3 04/02/2016                      Has had erectile dysfunction for quite sometime.  Also had decreased libido previously and was found to have hypogonadism, probably in 2011. Original evaluation is not available.   He did have a slightly low free testosterone level in 2012 on treatment Prolactin level normal   Symptoms are controlled with AndroGel 4 pumps without any fatigue  On his last visit his testosterone level was just over 300 He now has been compliant with the testosterone gel   Lab Results  Component Value Date   TESTOSTERONE 433.04 10/08/2016        HYPERTENSION: This is followed by PCP and is on 100 mg of losartan along with Maxide, amlodipine and metoprolol  Blood pressure Appears controlled  now   LABS:  Lab on 10/08/2016  Component Date Value Ref Range Status  . Hgb A1c MFr Bld 10/08/2016 7.7* 4.6 - 6.5 % Final  . Sodium 10/08/2016 139  135 - 145 mEq/L Final  . Potassium 10/08/2016 3.8  3.5 - 5.1 mEq/L Final  . Chloride 10/08/2016 105  96 - 112 mEq/L Final  . CO2 10/08/2016 25  19 - 32 mEq/L Final  . Glucose, Bld 10/08/2016 153* 70 - 99 mg/dL Final  . BUN 10/08/2016 17  6 - 23 mg/dL Final  . Creatinine, Ser 10/08/2016 1.45  0.40 - 1.50 mg/dL Final  . Total Bilirubin 10/08/2016 0.5  0.2 - 1.2 mg/dL Final  . Alkaline Phosphatase 10/08/2016 58  39 - 117 U/L Final  . AST 10/08/2016 16  0 - 37 U/L Final  . ALT 10/08/2016 21  0 - 53 U/L Final  . Total Protein 10/08/2016 7.1  6.0 - 8.3 g/dL Final  . Albumin 10/08/2016 3.8  3.5 - 5.2 g/dL Final  . Calcium 10/08/2016 9.5  8.4 - 10.5 mg/dL Final  . GFR 10/08/2016 60.43  >60.00 mL/min Final  . Testosterone 10/08/2016 433.04  300.00 - 890.00 ng/dL Final    Physical Examination:  BP 138/80   Pulse 65   Ht 6' (1.829 m)   Wt 256  lb (116.1 kg)   SpO2 93%   BMI 34.72 kg/m      ASSESSMENT/PLAN:   Diabetes type 2, with obesity See history of present illness for detailed discussion of current diabetes management, blood sugar patterns and problems identified  His A1c is progressively getting higher him a previously as low as 6.8 and now 7.7 This is higher than expected for his home blood sugar average of 142 recently and he is also monitoring readings at various times of the day at least once a day now He is generally exercising but his weight is still up a couple of pounds since last visit  He is not getting adequate doses of his metformin with only one tablet today in the morning Also needs a more efficacious GLP-1 drug He had been on Bydureon but this was rather painful for him and he will do well with Trulicity which needs to be authorized He does need to be consistent with diet and may consider consultation with  dietitian  For now will need to increase his metformin to twice a day for better efficacy, he has not had any intolerance with this; renal function is adequate for this Discussed adjustment of AMARYL as most likely he will need less with increasing the metformin and also switching to Trulicity For now he can reduce this to just an evening dose He may need to stop the evening dose also if he is getting low normal blood sugars overnight Discussed blood sugar targets at various times Reviewed use of the Trulicity pen  Will need short-term follow-up in 2 months  Hypertension: Controlled.  Renal function stable  HYPOGONADISM: He will continue 4 pumps a day as his testosterone level is good and he is subjectively feeling well   Patient Instructions  Metformin twice daily with meals  With Trulicity stop of Glimeperide  Check blood sugars on waking up  2-3x weekly  Also check blood sugars about 2 hours after a meal and do this after different meals by rotation  Recommended blood sugar levels on waking up is 90-130 and about 2 hours after meal is 130-160  Please bring your blood sugar monitor to each visit, thank you    Counseling time on subjects discussed above is over 50% of today's 25 minute visit   Kymorah Korf 10/11/2016, 8:50 AM   Note: This office note was prepared with Estate agent. Any transcriptional errors that result from this process are unintentional.

## 2016-10-11 NOTE — Patient Instructions (Addendum)
Metformin twice daily with meals  With Trulicity stop of Glimeperide  Check blood sugars on waking up  2-3x weekly  Also check blood sugars about 2 hours after a meal and do this after different meals by rotation  Recommended blood sugar levels on waking up is 90-130 and about 2 hours after meal is 130-160  Please bring your blood sugar monitor to each visit, thank you

## 2016-10-12 ENCOUNTER — Ambulatory Visit (INDEPENDENT_AMBULATORY_CARE_PROVIDER_SITE_OTHER): Payer: Medicare Other | Admitting: Internal Medicine

## 2016-10-12 ENCOUNTER — Encounter: Payer: Self-pay | Admitting: Internal Medicine

## 2016-10-12 VITALS — BP 124/72 | HR 69 | Temp 97.3°F | Resp 14 | Ht 72.0 in | Wt 256.0 lb

## 2016-10-12 DIAGNOSIS — M545 Low back pain, unspecified: Secondary | ICD-10-CM

## 2016-10-12 DIAGNOSIS — G8929 Other chronic pain: Secondary | ICD-10-CM | POA: Diagnosis not present

## 2016-10-12 NOTE — Progress Notes (Signed)
Pre visit review using our clinic review tool, if applicable. No additional management support is needed unless otherwise documented below in the visit note. 

## 2016-10-12 NOTE — Patient Instructions (Signed)
Stretch your back and upper extremity twice a day   [employer] with information about home physical therapy for back pain: [email or web address][employer][email or web address]  Tylenol  500 mg OTC 2 tabs a day every 8 hours as needed for pain  If that is not working, please call for a physical therapy referral

## 2016-10-12 NOTE — Telephone Encounter (Signed)
Pt seen today by PCP. Back brace form not needed.

## 2016-10-12 NOTE — Progress Notes (Signed)
Subjective:    Patient ID: Jonathan Leather., male    DOB: August 28, 1936, 81 y.o.   MRN: [id number]  DOS:  10/12/2016 Type of visit - description : For evaluation of back pain Interval history: Patient received an unsolicited call from a company selling back braces, he is here because he would like me to sign the prescription for a back brace.  He reports many years history of back pain, mostly in the morning, he usually goes to the gym and feels better. Back pain resurface later on the day. Pain does not radiate and is on the low back bilaterally He has not consulted anybody regards the pain, no previous x-rays or workup. Denies any history of injuries.    Review of Systems No lower extremity paresthesias.  Past Medical History:  Diagnosis Date  . CAD (coronary artery disease)    Two RCA stents remotely / 3rd RCA stent 2006  . Colon polyps    s/p several Cscopes.  Marland Kitchen COPD (chronic obstructive pulmonary disease) (HCC)    on O2, nocturnal  . Diabetes mellitus    dx aprox 2009  . ED (erectile dysfunction)    has a vacumm device  . Ejection fraction   . Hyperlipidemia    dx in 90s  . Hypertension    dx in the 90  . Hypogonadism male   . PVD (peripheral vascular disease) ([place])    s/p stents at LE 2009, Dr Einar Gip  . Shortness of breath    O2 Sat dropped to 82% walking on the treadmill, September, 2012    Past Surgical History:  Procedure Laterality Date  . APPENDECTOMY    . TONSILLECTOMY      Social History   Social History  . Marital status: Married    Spouse name: Cerrella   . Number of children: 6  . Years of education: N/A   Occupational History  . 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 Smoking status: Former Smoker    Packs/day: 2.00    Years: 30.00    Types: Cigarettes    Quit date: 06/17/1977  . Smokeless tobacco: Never Used     Comment: 2 ppd, quit 1987  . Alcohol use 0.0 oz/week     Comment: socially   . Drug use:  No  . Sexual activity: Not on file   Other Topics Concern  . Not on file   Social History Narrative   Has 2 step sons, and 4 children (lost 1 son)                      Allergies as of 10/12/2016   No Known Allergies     Medication List       Accurate as of 10/12/16 11:59 PM. Always use your most recent med list.          amLODipine 10 MG tablet Commonly known as:  NORVASC TAKE 1 TABLET DAILY   aspirin 81 MG tablet Take 81 mg by mouth daily.   clopidogrel 75 MG tablet Commonly known as:  PLAVIX Take 1 tablet (75 mg total) by mouth daily.   Dulaglutide 1.5 MG/0.5ML Sopn Commonly known as:  TRULICITY Inject weekly   freestyle lancets USE AS INSTRUCTED TO CHECK BLOOD SUGAR TWICE A DAY   FREESTYLE LITE test strip Generic drug:  glucose blood USE AS INSTRUCTED TO CHECK BLOOD SUGAR TWICE A DAY  glimepiride 4 MG tablet Commonly known as:  AMARYL TAKE ONE AND ONE-HALF TABLETS DAILY   losartan 100 MG tablet Commonly known as:  COZAAR Take 1 tablet (100 mg total) by mouth daily.   metFORMIN 1000 MG tablet Commonly known as:  GLUCOPHAGE TAKE 1 TABLET TWICE A DAY WITH MEALS   metoprolol succinate 100 MG 24 hr tablet Commonly known as:  TOPROL XL Take 1 tablet (100 mg total) by mouth daily.   multivitamin with minerals Tabs tablet Take 1 tablet by mouth daily.   simvastatin 20 MG tablet Commonly known as:  ZOCOR Take 1 tablet (20 mg total) by mouth at bedtime.   SPIRIVA HANDIHALER 18 MCG inhalation capsule Generic drug:  tiotropium INHALE THE CONTENTS OF 1 CAPSULE DAILY   SYMBICORT 160-4.5 MCG/ACT inhaler Generic drug:  budesonide-formoterol USE 2 INHALATIONS TWICE A DAY   TANZEUM 50 MG Pen Generic drug:  Albiglutide INJECT THE CONTENTS OF 1 PEN EVERY WEEK   Testosterone 20.25 MG/ACT (1.62%) Gel Place 4 application onto the skin daily.   triamterene-hydrochlorothiazide 37.5-25 MG tablet Commonly known as:  MAXZIDE-25 Take 1 tablet by mouth  daily.          Objective:   Physical Exam BP 124/72 (BP Location: Left Arm, Patient Position: Sitting, Cuff Size: Normal)   Pulse 69   Temp 97.3 F (36.3 C) (Oral)   Resp 14   Ht 6' (1.829 m)   Wt 256 lb (116.1 kg)   SpO2 95%   BMI 34.72 kg/m  General:   Well developed, well nourished . NAD.  HEENT:  Normocephalic . Face symmetric, atraumatic MSK: No TTP  Skin: Not pale. Not jaundice Neurologic:  alert & oriented X3.  Speech normal, gait appropriate for age and unassisted DTRs symmetric, motor symmetric, straight leg test negative Psych--  Cognition and judgment appear intact.  Cooperative with normal attention span and concentration.  Behavior appropriate. No anxious or depressed appearing.      Assessment & Plan:   Assessment DM dx [id number], complicated by CAD, PVD, mild neuropathy. Sees Dr. Dwyane Dee Hypogonadism. Sees Dr. Dwyane Dee HTN Hyperlipidemia COPD, nocturnal O2 prn Back pain, chronic: See visit 10/12/2016 CV: -CAD, Dr. Ron Parker Dr Meda Coffee -Peripheral vascular disease, stents lower extremity 2009  ED  PLAN Back pain: Is a chronic issue, today is the first time he mentioned that to me. No red flag symptoms, I think he will benefit greatly from stretching and physical therapy. No need for a back brace at this point. I recommend self PT but the patient eventually decided to be referred for formal therapy. Referral sent. Tylenol when necessary okay

## 2016-10-14 NOTE — Assessment & Plan Note (Signed)
Back pain: Is a chronic issue, today is the first time he mentioned that to me. No red flag symptoms, I think he will benefit greatly from stretching and physical therapy. No need for a back brace at this point. I recommend self PT but the patient eventually decided to be referred for formal therapy. Referral sent. Tylenol when necessary okay

## 2016-10-15 NOTE — Telephone Encounter (Signed)
Need a PA for medication trulicity. He stated to call this number.   [phone]

## 2016-10-16 ENCOUNTER — Ambulatory Visit: Payer: Medicare Other | Attending: Internal Medicine | Admitting: Physical Therapy

## 2016-10-16 DIAGNOSIS — M545 Low back pain, unspecified: Secondary | ICD-10-CM

## 2016-10-16 DIAGNOSIS — G8929 Other chronic pain: Secondary | ICD-10-CM | POA: Diagnosis not present

## 2016-10-16 NOTE — Therapy (Signed)
[facility] [address]  [place], [place], [address] Phone: [phone]   Fax:  [phone]  Physical Therapy Evaluation  Patient Details  Name: Jonathan [name]. MRN: [id number] Date of Birth: 01/07/1936 Referring Provider: Dr. Colon Branch, MD  Encounter Date: 10/16/2016      PT End of Session - 10/16/16 0956    Visit Number 1   Number of Visits 12   Date for PT Re-Evaluation 11/30/16   Authorization Type Medicare- Tricare PT only   PT Start Time 0850   PT Stop Time 0931   PT Time Calculation (min) 41 min   Activity Tolerance Patient tolerated treatment well   Behavior During Therapy [facility] for tasks assessed/performed      Past Medical History:  Diagnosis Date  . CAD (coronary artery disease)    Two RCA stents remotely / 3rd RCA stent 2006  . Colon polyps    s/p several Cscopes.  Marland Kitchen COPD (chronic obstructive pulmonary disease) (HCC)    on O2, nocturnal  . Diabetes mellitus    dx aprox 2009  . ED (erectile dysfunction)    has a vacumm device  . Ejection fraction   . Hyperlipidemia    dx in 90s  . Hypertension    dx in the 90  . Hypogonadism male   . PVD (peripheral vascular disease) ([place])    s/p stents at LE 2009, Dr Einar Gip  . Shortness of breath    O2 Sat dropped to 82% walking on the treadmill, September, 2012    Past Surgical History:  Procedure Laterality Date  . APPENDECTOMY    . TONSILLECTOMY      There were no vitals filed for this visit.       Subjective Assessment - 10/16/16 0853    Subjective pt reports he is having lower back pain that has been hurting for at least more than 10 years. He reports he wakes up with the pain in the mornings but once he goes to the gym and begins his 50-60 minutes of cardio the pain goes away. he reports he is able to sit down but once he gets up to move again the pain returns until he is able to walk it off for several minutes.    Diagnostic tests  2005: MRI - IMPRESSION:  1. L3-4 right paracentral to right posterolateral shallow disk protrusion; 2. At L4-5, focal broad-based central disk protrusion; 3. At L5-S1, right paracentral to right posterolateral annular disk bulge.  2007: X-Ray - Impression: No acute lumbar spine abnormality.   Patient Stated Goals "to be pain free"   Currently in Pain? Yes   Pain Score 4   Worst: 8/10 Best: 0/10 Avg: 4-5/10   Pain Location Back   Pain Orientation Lower;Left;Right   Pain Descriptors / Indicators Sharp   Pain Type Chronic pain   Pain Radiating Towards Stays in lower back   Pain Onset More than a month ago   Pain Frequency Intermittent   Aggravating Factors  moving after sitting still for awhile   Pain Relieving Factors moving around   Effect of Pain on Daily Activities nothing; won't let pain stop him            [facility] PT Assessment - 10/16/16 0900      Assessment   Medical Diagnosis Chronic bilateral Low Back Pain without Sciatica   Referring Provider Dr. Colon Branch, MD   Onset Date/Surgical Date --  greater than 10 years   Next MD Visit TBD   Prior Therapy many years ago     Balance Screen   Has the patient fallen in the past 6 months No   Has the patient had a decrease in activity level because of a fear of falling?  No   Is the patient reluctant to leave their home because of a fear of falling?  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); visit with friends     Observation/Other Assessments   Focus on Therapeutic Outcomes (FOTO)  Lumbar Spine: 83% (17% limitation) Predicted: 79% ( 21% limitation)     Posture/Postural Control   Posture/Postural Control Postural limitations   Postural Limitations Rounded  Shoulders;Decreased lumbar lordosis     ROM / Strength   AROM / PROM / Strength AROM;Strength     AROM   AROM Assessment Site Lumbar   Lumbar Flexion Hands to knees - crawls up leg   Lumbar Extension 30% of full ROM   Lumbar - Right Side Bend 2" above knee- painful in low back   Lumbar - Left Side Bend 2" above knee- pain along low back   Lumbar - Right Rotation 70% of full ROM   Lumbar - Left Rotation 70% of full ROM     Strength   Strength Assessment Site Lumbar;Hip   Right/Left Hip Right;Left   Right Hip Flexion 4-/5   Right Hip Extension 4-/5   Right Hip External Rotation  4-/5   Right Hip Internal Rotation 4-/5   Right Hip ABduction 4/5   Right Hip ADduction 4-/5   Left Hip Flexion 4-/5   Left Hip Extension 4-/5   Left Hip External Rotation 4-/5   Left Hip Internal Rotation 4-/5   Left Hip ABduction 4/5   Left Hip ADduction 4-/5     Flexibility   Soft Tissue Assessment /Muscle Length yes   Hamstrings mod tightness    Quadriceps mod tightness   ITB mild tightness   Piriformis mod tightness      Special Tests    Special Tests Lumbar   Lumbar Tests Prone Knee Bend Test;other     Prone Knee Bend Test   Findings Positive   Comment Both sides increased lower back pain     other   Findings Positive   Comments + Gower's sign during sit <>stand and lumbar flexion                   [facility] Adult PT Treatment/Exercise - 10/16/16 0900      Lumbar Exercises: Stretches   Passive Hamstring Stretch 1 rep;30 seconds   Passive Hamstring Stretch Limitations supine with strap; both legs   Quad Stretch 1 rep;30 seconds   Quad Stretch Limitations prone with strap; both legs   ITB Stretch 1 rep;30 seconds   ITB Stretch Limitations supine with strap; both legs                PT Education - 10/16/16 0955    Education provided Yes   Education Details Eval findings, POC, and initial HEP   Person(s) Educated Patient   Methods  Explanation;Demonstration;Handout   Comprehension  Verbalized understanding;Returned demonstration          PT Short Term Goals - 11-12-2016 1021      PT SHORT TERM GOAL #1   Title pt will be independent with initial HEP by 11/02/16.   Status New     PT SHORT TERM GOAL #2   Title Pt will verbalize understanding of correct posture & body mechanics while seated and during activities by 11/02/16.   Status New           PT Long Term Goals - 2016/11/12 1023      PT LONG TERM GOAL #1   Title Pt will be independent with advanced HEP by 11/30/16.   Status New     PT LONG TERM GOAL #2   Title pt will be able to rise from resting positions with at least a 50% reduction in low back pain by 11/30/16.   Status New     PT LONG TERM GOAL #3   Title Pt will be able to demonstrate proper posture and body mechanics while seated and during activities by 11/30/16.   Status New     PT LONG TERM GOAL #4   Title Pt will demonstrate increased B LE strength >/= 4/5 by 11/30/16.   Status New     PT LONG TERM GOAL #5   Title Pt will demonstrate Lumbar range of motion within 75% of functional range by 11/30/16.    Status New               Plan - 12-Nov-2016 0959    Clinical Impression Statement Jonathan [name] is a 81 year old male reporting to therapy today with chronic low back pain. He reports he has had back pain for several years but just recently brought it to his doctor's attention because of his interest in receiving a back brace. Pt reported his pain improves while he is wearing a brace but was informed that he would benefit more from physical therapy. Pt has rounded shoulders and decreased lumbar lordosis in sitting and standing posture. Pt has decreased Lumbar range of motion with lumbar extension being most limited. Pt did show a + Gower's sign when returning from lumbar flexion in standing and during sit <> stand. Pt's overall LE strength was 4-/5 with B hip abduction being 4/5. Pt has  moderate B tightness in hamstring, piriformis, quadriceps, and IT band. Pt will benefit from skilled therapeutic intervention to increase range of motion, increase core & LE strength, increase LE flexibility, and decrease low back pain.    Rehab Potential Good   Clinical Impairments Affecting Rehab Potential DM, HTN   PT Frequency 2x / week   PT Duration 6 weeks   PT Treatment/Interventions Patient/family education;ADLs/Self Care Home Management;Therapeutic exercise;Manual techniques;Moist Heat;Cryotherapy;Electrical Stimulation;Neuromuscular re-education;Traction;Dry needling;Taping;Iontophoresis 4mg /ml Dexamethasone;Ultrasound   PT Next Visit Plan Assess & update initial HEP; Introduce posture & body mechanics handout; begin core & LE strengthening; Assess pt's usual gym routine; manual therapy & modalities PRN   Consulted and Agree with Plan of Care Patient      Patient will benefit from skilled therapeutic intervention in order to improve the following deficits and impairments:  Pain, Decreased range of motion, Decreased strength, Impaired flexibility, Postural dysfunction  Visit Diagnosis: Chronic bilateral low back pain without sciatica      G-Codes - 11/12/2016 1329    Functional Assessment Tool Used Lumbar Spine FOTO = 83% (17% limitation) + clinical judgement   Functional Limitation Other PT primary  Other PT Primary Current Status [phone]) At least 20 percent but less than 40 percent impaired, limited or restricted   Other PT Primary Goal Status [id number]) At least 20 percent but less than 40 percent impaired, limited or restricted       Problem List Patient Active Problem List   Diagnosis Date Noted  . PCP NOTES >>>>>>>>>>>>>>>>>>> 04/03/2016  . Coronary artery disease involving native coronary artery of native heart without angina pectoris 01/23/2016  . Cramp of both lower extremities 01/23/2016  . Hypogonadism male 12/16/2014  . Chronic kidney disease, stage II (mild)  02/28/2014  . Pain in lower limb ([place]) 01/08/2014  . Ejection fraction   . Onychomycosis 04/10/2013  . Essential tremor 10/14/2012  . Annual physical exam 12/11/2011  . Vitamin D deficiency 12/11/2011  . Back pain, chronic 06/18/2011  . COPD mixed type ([place])   . CAD (coronary artery disease)   . PVD (peripheral vascular disease) ([place])   . HEADACHE 10/06/2010  . Hypogonadism in male 06/21/2010  . DMII (diabetes mellitus, type 2) ([place]) 03/17/2010  . Hyperlipidemia 03/17/2010  . Essential hypertension 03/17/2010    Jonathan [name], Jonathan [name] 10/16/2016, 1:35 PM  [facility] [place], [place], [address] Phone: [phone]   Fax:  [phone]  Name: Jonathan [name]. MRN: [id number] Date of Birth: 08-21-1936    Read, reviewed, edited and agree with student's findings and recommendations.   This entire session was guided, instructed, and directly supervised by   Percival Spanish, PT, MPT 10/16/16, 1:41 PM  [facility] [address]  [place], [place], [address] Phone: [phone]   Fax:  [phone]

## 2016-10-16 NOTE — Addendum Note (Signed)
Addended by: Percival Spanish on: 10/16/2016 06:31 PM   Modules accepted: Orders

## 2016-10-16 NOTE — Telephone Encounter (Signed)
Calling on the status of PA for medication Trulicity.  patient stated he is almost out of medication,  Please call [facility] [phone]

## 2016-10-18 DIAGNOSIS — H2513 Age-related nuclear cataract, bilateral: Secondary | ICD-10-CM | POA: Diagnosis not present

## 2016-10-18 DIAGNOSIS — E119 Type 2 diabetes mellitus without complications: Secondary | ICD-10-CM | POA: Diagnosis not present

## 2016-10-19 ENCOUNTER — Ambulatory Visit: Payer: Medicare Other | Attending: Internal Medicine | Admitting: Physical Therapy

## 2016-10-19 DIAGNOSIS — M545 Low back pain, unspecified: Secondary | ICD-10-CM

## 2016-10-19 DIAGNOSIS — G8929 Other chronic pain: Secondary | ICD-10-CM | POA: Insufficient documentation

## 2016-10-19 NOTE — Patient Instructions (Addendum)

## 2016-10-19 NOTE — Therapy (Signed)
[facility] [address]  [place], [place], [address] Phone: [phone]   Fax:  [phone]  Physical Therapy Treatment  Patient Details  Name: Jonathan [name]. MRN: [id number] Date of Birth: [birth year] Referring Provider: Dr. Colon Branch, MD  Encounter Date: 10/19/2016      PT End of Session - 10/19/16 0809    Visit Number 2   Number of Visits 12   Date for PT Re-Evaluation 11/30/16   Authorization Type Medicare- Tricare PT only   PT Start Time 0805   PT Stop Time 0846   PT Time Calculation (min) 41 min   Activity Tolerance Patient tolerated treatment well   Behavior During Therapy [facility] for tasks assessed/performed      Past Medical History:  Diagnosis Date  . CAD (coronary artery disease)    Two RCA stents remotely / 3rd RCA stent 2006  . Colon polyps    s/p several Cscopes.  Marland Kitchen COPD (chronic obstructive pulmonary disease) (HCC)    on O2, nocturnal  . Diabetes mellitus    dx aprox 2009  . ED (erectile dysfunction)    has a vacumm device  . Ejection fraction   . Hyperlipidemia    dx in 90s  . Hypertension    dx in the 90  . Hypogonadism male   . PVD (peripheral vascular disease) ([place])    s/p stents at LE 2009, Dr Einar Gip  . Shortness of breath    O2 Sat dropped to 82% walking on the treadmill, September, 2012    Past Surgical History:  Procedure Laterality Date  . APPENDECTOMY    . TONSILLECTOMY      There were no vitals filed for this visit.      Subjective Assessment - 10/19/16 0807    Subjective Pt reports back is doing fine and stretches have been going well at home.    Currently in Pain? Yes   Pain Score 3    Pain Location Back                         [facility] Adult PT Treatment/Exercise - 10/19/16 0807      Lumbar Exercises: Stretches   Passive Hamstring Stretch 1 rep;30 seconds   Passive Hamstring Stretch Limitations supine with strap; both legs   Quad Stretch 1  rep;30 seconds   Quad Stretch Limitations prone with strap; both legs   ITB Stretch 1 rep;30 seconds   ITB Stretch Limitations supine with strap; both legs   Piriformis Stretch 1 rep;30 seconds   Piriformis Stretch Limitations KTOS & fig 4 with strap     Lumbar Exercises: Aerobic   Stationary Bike Nustep lvl 4 X 6'     Lumbar Exercises: Standing   Row 10 reps;Theraband   Theraband Level (Row) Level 3 (Green)   Row Limitations 5" holds + ab set     Lumbar Exercises: Supine   Ab Set 5 reps;5 seconds   AB Set Limitations + pelvic tilt   Clam 10 reps;3 seconds   Clam Limitations Green TB + ab set   Bridge 10 reps;3 seconds   Other Supine Lumbar Exercises Bridge with ball squeeze; 10 reps; 3" holds                PT Education - 10/19/16 1312    Education provided Yes   Education Details Posture & body mechanics handout & initial strengthening HEP  PT Short Term Goals - 10/19/16 0810      PT SHORT TERM GOAL #1   Title pt will be independent with initial HEP by 11/02/16.   Status On-going     PT SHORT TERM GOAL #2   Title Pt will verbalize understanding of correct posture & body mechanics while seated and during activities by 11/02/16.   Status On-going           PT Long Term Goals - 10/19/16 0810      PT LONG TERM GOAL #1   Title Pt will be independent with advanced HEP by 11/30/16.   Status On-going     PT LONG TERM GOAL #2   Title pt will be able to rise from resting positions with at least a 50% reduction in low back pain by 11/30/16.   Status On-going     PT LONG TERM GOAL #3   Title Pt will be able to demonstrate proper posture and body mechanics while seated and during activities by 11/30/16.   Status On-going     PT LONG TERM GOAL #4   Title Pt will demonstrate increased B LE strength >/= 4/5 by 11/30/16.   Status On-going     PT LONG TERM GOAL #5   Title Pt will demonstrate Lumbar range of motion within 75% of functional range by 11/30/16.     Status On-going               Plan - 10/19/16 0809    Clinical Impression Statement Pt reports to therapy with report that back has been doing fine. He was able to perform his initial HEP correctly at home and in the office today. Today's session focused on beginning core/LE strengthening. Pt did appear fatigued following warm up on Nustep. Pt tolerated all exercises well and reported no increase in back pain. Pt was given initial strengthening exercises to begin performing at home. Proper posture and body mechanics handout was introduced to the pt today. He will review this weekend and report back with any concerns.    Rehab Potential Good   Clinical Impairments Affecting Rehab Potential DM, HTN   PT Treatment/Interventions Patient/family education;ADLs/Self Care Home Management;Therapeutic exercise;Manual techniques;Moist Heat;Cryotherapy;Electrical Stimulation;Neuromuscular re-education;Traction;Dry needling;Taping;Iontophoresis 4mg /ml Dexamethasone;Ultrasound   PT Next Visit Plan Assess recent HEP & review posture & body mechanics handout; continue with progression of core & LE strengthening; asses pt's usual gym routine if he is able to recall exercises; manual therapy & modalities PRN   Consulted and Agree with Plan of Care Patient      Patient will benefit from skilled therapeutic intervention in order to improve the following deficits and impairments:  Pain, Decreased range of motion, Decreased strength, Impaired flexibility, Postural dysfunction  Visit Diagnosis: Chronic bilateral low back pain without sciatica     Problem List Patient Active Problem List   Diagnosis Date Noted  . PCP NOTES >>>>>>>>>>>>>>>>>>> 04/03/2016  . Coronary artery disease involving native coronary artery of native heart without angina pectoris 01/23/2016  . Cramp of both lower extremities 01/23/2016  . Hypogonadism male 12/16/2014  . Chronic kidney disease, stage II (mild) 02/28/2014  . Pain  in lower limb ([place]) 01/08/2014  . Ejection fraction   . Onychomycosis 04/10/2013  . Essential tremor 10/14/2012  . Annual physical exam 12/11/2011  . Vitamin D deficiency 12/11/2011  . Back pain, chronic 06/18/2011  . COPD mixed type ([place])   . CAD (coronary artery disease)   . PVD (peripheral vascular disease) ([place])   .  HEADACHE 10/06/2010  . Hypogonadism in male 06/21/2010  . DMII (diabetes mellitus, type 2) ([place]) 03/17/2010  . Hyperlipidemia 03/17/2010  . Essential hypertension 03/17/2010    Lauralee Evener, SPT 10/19/2016, 1:13 PM  [facility] [address]  [place], [place], [address] Phone: [phone]   Fax:  [phone]  Name: Kalleb Hemple. MRN: [id number] Date of Birth: Jun 22, 1936

## 2016-10-22 ENCOUNTER — Other Ambulatory Visit: Payer: Self-pay | Admitting: Endocrinology

## 2016-10-22 ENCOUNTER — Ambulatory Visit: Payer: Medicare Other | Admitting: Physical Therapy

## 2016-10-22 ENCOUNTER — Other Ambulatory Visit: Payer: Self-pay

## 2016-10-22 DIAGNOSIS — G8929 Other chronic pain: Secondary | ICD-10-CM

## 2016-10-22 DIAGNOSIS — M545 Low back pain: Secondary | ICD-10-CM | POA: Diagnosis not present

## 2016-10-22 MED ORDER — LOSARTAN POTASSIUM 100 MG PO TABS: 100.0000 mg | ORAL_TABLET | Freq: Every day | ORAL | 2 refills | Status: DC

## 2016-10-22 NOTE — Therapy (Addendum)
[facility] [address]  [place], [place], [address] Phone: [phone]   Fax:  [phone]  Physical Therapy Treatment  Patient Details  Name: Jonathan [name]. MRN: [id number] Date of Birth: [birth year] Referring Provider: Dr. Colon Branch, MD  Encounter Date: 10/22/2016      PT End of Session - 10/22/16 0806    Visit Number 3   Number of Visits 12   Date for PT Re-Evaluation 11/30/16   Authorization Type Medicare- [employer] PT only   PT Start Time 0803   PT Stop Time 0842   PT Time Calculation (min) 39 min   Activity Tolerance Patient tolerated treatment well   Behavior During Therapy [facility] for tasks assessed/performed      Past Medical History:  Diagnosis Date  . CAD (coronary artery disease)    Two RCA stents remotely / 3rd RCA stent 2006  . Colon polyps    s/p several Cscopes.  Marland Kitchen COPD (chronic obstructive pulmonary disease) (HCC)    on O2, nocturnal  . Diabetes mellitus    dx aprox 2009  . ED (erectile dysfunction)    has a vacumm device  . Ejection fraction   . Hyperlipidemia    dx in 90s  . Hypertension    dx in the 90  . Hypogonadism male   . PVD (peripheral vascular disease) ([place])    s/p stents at LE 2009, Dr Einar Gip  . Shortness of breath    O2 Sat dropped to 82% walking on the treadmill, September, 2012    Past Surgical History:  Procedure Laterality Date  . APPENDECTOMY    . TONSILLECTOMY      There were no vitals filed for this visit.      Subjective Assessment - 10/22/16 0805    Subjective Pt reports back is doing good and HEP is going well at home.    Patient Stated Goals "to be pain free"   Currently in Pain? No/denies                         [facility] Adult PT Treatment/Exercise - 10/22/16 0804      Lumbar Exercises: Aerobic   Stationary Bike NuStep lvl 3 x 6'      Lumbar Exercises: Machines for Strengthening   Cybex Knee Extension Both Legs; 55# 6 reps; 65#  9 reps   Other Lumbar Machine Exercise Rows; 15 reps; 3" holds + ab set; 55#      Lumbar Exercises: Supine   Ab Set 10 reps;5 seconds   AB Set Limitations ab set + single leg kick out from table top   Bridge 10 reps;3 seconds   Other Supine Lumbar Exercises Bridge with alt clam; 10 reps; green TB; + ab set     Lumbar Exercises: Sidelying   Clam 15 reps;3 seconds   Clam Limitations Green TB + ab set      Lumbar Exercises: Quadruped   Straight Leg Raise 5 reps;5 seconds   Straight Leg Raises Limitations + ab set                  PT Short Term Goals - 10/22/16 0857      PT SHORT TERM GOAL #1   Title pt will be independent with initial HEP by 11/02/16.   Status Achieved     PT SHORT TERM GOAL #2   Title Pt will verbalize understanding of correct  posture & body mechanics while seated and during activities by 11/02/16.   Status On-going           PT Long Term Goals - 10/19/16 0810      PT LONG TERM GOAL #1   Title Pt will be independent with advanced HEP by 11/30/16.   Status On-going     PT LONG TERM GOAL #2   Title pt will be able to rise from resting positions with at least a 50% reduction in low back pain by 11/30/16.   Status On-going     PT LONG TERM GOAL #3   Title Pt will be able to demonstrate proper posture and body mechanics while seated and during activities by 11/30/16.   Status On-going     PT LONG TERM GOAL #4   Title Pt will demonstrate increased B LE strength >/= 4/5 by 11/30/16.   Status On-going     PT LONG TERM GOAL #5   Title Pt will demonstrate Lumbar range of motion within 75% of functional range by 11/30/16.    Status On-going               Plan - 10/22/16 1937    Clinical Impression Statement Pt reports to therapy today with a report of no low back pain. He reports his HEP has been going well at home. He was able to recall his exercises today and perform with correct technique. Today's session focused on progressing core/LE  exercises and assessing the pt's normal gym routine. Pt was able to perform with correct technique and will continue these exercises, rows & knee extension, at the gym. He continues to appear fatigued after the warm up on the bike. Pt reported no increased pain at the end of today's session. Pt will continue to benefit from skilled therapy to improve core/LE strengthening and posture & body mechanics during exercises.    Rehab Potential Good   Clinical Impairments Affecting Rehab Potential DM, HTN   PT Treatment/Interventions Patient/family education;ADLs/Self Care Home Management;Therapeutic exercise;Manual techniques;Moist Heat;Cryotherapy;Electrical Stimulation;Neuromuscular re-education;Traction;Dry needling;Taping;Iontophoresis 23m/ml Dexamethasone;Ultrasound   PT Next Visit Plan Continue to assess pt's gym routine as he recalls exercises; continue with progression of core & LE strengthening, introduce squats & lunges using TRX; manual therapy & modalities PRN   Consulted and Agree with Plan of Care Patient      Patient will benefit from skilled therapeutic intervention in order to improve the following deficits and impairments:  Pain, Decreased range of motion, Decreased strength, Impaired flexibility, Postural dysfunction  Visit Diagnosis: Chronic bilateral low back pain without sciatica     Problem List Patient Active Problem List   Diagnosis Date Noted  . PCP NOTES >>>>>>>>>>>>>>>>>>> 04/03/2016  . Coronary artery disease involving native coronary artery of native heart without angina pectoris 01/23/2016  . Cramp of both lower extremities 01/23/2016  . Hypogonadism male 12/16/2014  . Chronic kidney disease, stage II (mild) 02/28/2014  . Pain in lower limb (calves) 01/08/2014  . Ejection fraction   . Onychomycosis 04/10/2013  . Essential tremor 10/14/2012  . Annual physical exam 12/11/2011  . Vitamin D deficiency 12/11/2011  . Back pain, chronic 06/18/2011  . COPD mixed type  ([place]   . CAD (coronary artery disease)   . PVD [place]ipheral vascular disease) ([place]   . HEADACHE 10/06/2010  . Hypogonadism in male 06/21/2010  . DMII (diabetes mellitus, type 2) ([place] 03/17/2010  . Hyperlipidemia 03/17/2010  . Essential hypertension 03/17/2010    TLauralee Evener SPT  10-29-2016, 4:13 PM  [facility] [address]  [place], [place], [address] Phone: [phone]   Fax:  [phone]  Name: Jonathan [name]. MRN: [id number] Date of Birth: 06-Feb-1936  PHYSICAL THERAPY DISCHARGE SUMMARY  Visits from Start of Care: 3  Current functional level related to goals / functional outcomes: Unable to assess functional level related to goals at discharge due to patient not returning for further visits as pt called requesting to be discharged with plan to continue going to the gym.    Remaining deficits: As above.    Education / Equipment: HEP & Posture/Body Special educational needs teacher.   G-Codes - 10/29/2016    Functional Assessment Tool Used Clinical judgement   Functional Limitation Other PT primary   Other PT Primary Goal Status (F8403) At least 20 percent but less than 40 percent impaired, limited or restricted   Other PT Primary Discharge Status (F5436) At least 20 percent but less than 40 percent impaired, limited or restricted   Plan: Patient agrees to discharge.  Patient goals were partially met. Patient is being discharged due to not returning since the last visit.  ?????         Lauralee Evener, SPT 10/29/16, 8:59 AM

## 2016-10-24 ENCOUNTER — Ambulatory Visit: Payer: Medicare Other | Admitting: Physical Therapy

## 2016-10-29 ENCOUNTER — Ambulatory Visit: Payer: Medicare Other | Admitting: Physical Therapy

## 2016-10-29 NOTE — Telephone Encounter (Addendum)
I contacted the patient on the status of the trulicity. Patient stated no PA was needed and he picked up his trulicity on [phone].

## 2016-10-31 ENCOUNTER — Ambulatory Visit: Payer: Medicare Other | Admitting: Physical Therapy

## 2016-11-05 ENCOUNTER — Ambulatory Visit: Payer: Medicare Other | Admitting: Physical Therapy

## 2016-11-07 ENCOUNTER — Ambulatory Visit: Payer: Medicare Other | Admitting: Physical Therapy

## 2016-11-12 ENCOUNTER — Ambulatory Visit: Payer: Medicare Other | Admitting: Physical Therapy

## 2016-11-12 ENCOUNTER — Other Ambulatory Visit: Payer: Self-pay

## 2016-11-12 MED ORDER — METOPROLOL SUCCINATE ER 100 MG PO TB24: 100.0000 mg | ORAL_TABLET | Freq: Every day | ORAL | 2 refills | Status: DC

## 2016-11-12 MED ORDER — TRIAMTERENE-HCTZ 37.5-25 MG PO TABS: 1.0000 | ORAL_TABLET | Freq: Every day | ORAL | 2 refills | Status: DC

## 2016-11-12 MED ORDER — CLOPIDOGREL BISULFATE 75 MG PO TABS: 75.0000 mg | ORAL_TABLET | Freq: Every day | ORAL | 2 refills | Status: DC

## 2016-11-15 ENCOUNTER — Ambulatory Visit: Payer: Medicare Other | Admitting: Physical Therapy

## 2016-11-19 ENCOUNTER — Ambulatory Visit: Payer: Medicare Other | Admitting: Physical Therapy

## 2016-11-22 ENCOUNTER — Ambulatory Visit: Payer: Medicare Other | Admitting: Physical Therapy

## 2016-11-26 ENCOUNTER — Other Ambulatory Visit: Payer: Self-pay

## 2016-11-26 MED ORDER — TESTOSTERONE 20.25 MG/ACT (1.62%) TD GEL: 4.0000 "application " | Freq: Every day | TRANSDERMAL | 3 refills | Status: DC

## 2016-11-30 ENCOUNTER — Other Ambulatory Visit: Payer: Self-pay

## 2016-12-04 ENCOUNTER — Other Ambulatory Visit: Payer: Self-pay

## 2016-12-04 ENCOUNTER — Telehealth: Payer: Self-pay | Admitting: Endocrinology

## 2016-12-04 MED ORDER — TESTOSTERONE 20.25 MG/ACT (1.62%) TD GEL: 4.0000 "application " | Freq: Every day | TRANSDERMAL | 3 refills | Status: DC

## 2016-12-04 MED ORDER — BUDESONIDE-FORMOTEROL FUMARATE 160-4.5 MCG/ACT IN AERO: INHALATION_SPRAY | RESPIRATORY_TRACT | 1 refills | Status: DC

## 2016-12-04 MED ORDER — BUDESONIDE-FORMOTEROL FUMARATE 160-4.5 MCG/ACT IN AERO: INHALATION_SPRAY | RESPIRATORY_TRACT | 0 refills | Status: DC

## 2016-12-04 NOTE — Telephone Encounter (Signed)
Pt needs his Androgel and Symbicort sent to [facility].

## 2016-12-04 NOTE — Telephone Encounter (Signed)
This has been ordered 

## 2016-12-06 ENCOUNTER — Other Ambulatory Visit (INDEPENDENT_AMBULATORY_CARE_PROVIDER_SITE_OTHER): Payer: Medicare Other

## 2016-12-06 DIAGNOSIS — E1165 Type 2 diabetes mellitus with hyperglycemia: Secondary | ICD-10-CM

## 2016-12-06 LAB — BASIC METABOLIC PANEL
BUN: 19 mg/dL (ref 6–23)
CALCIUM: 9.6 mg/dL (ref 8.4–10.5)
CO2: 25 mEq/L (ref 19–32)
CREATININE: 1.39 mg/dL (ref 0.40–1.50)
Chloride: 107 mEq/L (ref 96–112)
GFR: 63.43 mL/min (ref >60.00)
Glucose, Bld: 110 mg/dL — ABNORMAL HIGH (ref 70–99)
Potassium: 3.6 mEq/L (ref 3.5–5.1)
Sodium: 141 mEq/L (ref 135–145)

## 2016-12-07 ENCOUNTER — Telehealth: Payer: Self-pay

## 2016-12-07 LAB — FRUCTOSAMINE: Fructosamine: 268 umol/L (ref 0–285)

## 2016-12-07 NOTE — Telephone Encounter (Signed)
Received PA form from [employer] for Symbicort. Questions answered and faxed to [phone]. Form sent for scanning. Awaiting determination.

## 2016-12-09 NOTE — Progress Notes (Signed)
Patient ID: Jonathan [name]., male   DOB: Nov 27, 1935, 81 y.o.   MRN: [id number]    Reason for Appointment:  Follow-up  History of Present Illness:          Diagnosis: Type 2 diabetes mellitus, date of diagnosis:  2009      Past history: He is not clear what his diabetes was diagnosed but according to hospital records it may have been in 2009. Most likely he was placed on metformin initially and at some point Amaryl added. In 2013 it was also given Januvia presumably to improve his control. However appears that his A1c has been consistently over 7% Janumet was started instead of metformin and Januvia separately in 2013  He was started on Trulicity in 6/65 instead of Victoza because excessive bruising on his abdomen  Later in 01/2015 he was switched to Bydureon because of insurance noncoverage of his Trulicity  Recent history:   Oral hypoglycemic drugs the patient is taking are: Metformin 1000 a day, Amaryl 4 mg 1-1/2 in the morning  He is now on Trulicity, previously on Tanzeum 50 mg weekly since about 12/16 This year has no difficulty getting Trulicity covered Last visit his A1c was progressively higher at 7.7, previously 7.4  Current blood sugar patterns and problems:  He is checking his blood sugars at various times of the day but has had a new meter on the last week and does not have readings from the prior meter.  With changing from Tanzeum to taking Trulicity his blood sugars appear to be much better  Also taking metformin twice a day now since last visit  He tends to have somewhat better blood sugars were the evening but has only a couple of readings in the mornings are slightly higher  However has lost weight Trulicity  No side effects with 1.5 mg weekly    Side effects from medications have been: None  Glucose monitoring:  done  Once a day         Glucometer:  FreeStyle Blood Glucose readings  by download  FASTING readings recently 123, 157 After lunch  128, 222 and after supper 84-139   Self-care:      Meals: 3 meals per day. eating egg/meat breading for breakfast, dinner 5-6    Exercise: Going 3-4/7 with cardio around 50 minutes at the gym Dietician visit: Most recent: 4/16 .                  Weight history:  Wt Readings from Last 3 Encounters:  12/10/16 251 lb (113.9 kg)  10/12/16 256 lb (116.1 kg)  10/11/16 256 lb (116.1 kg)   Glycemic control:  Lab Results  Component Value Date   HGBA1C 7.7 (H) 10/08/2016   HGBA1C 7.4 (H) 07/09/2016   HGBA1C 6.8 02/15/2016   Lab Results  Component Value Date   MICROALBUR 1.0 02/15/2016   LDLCALC 69 04/02/2016   CREATININE 1.39 12/06/2016    Lab Results  Component Value Date   FRUCTOSAMINE 268 12/06/2016   FRUCTOSAMINE 282 02/22/2014    OTHER active problems: See review of systems  Lab on 12/06/2016  Component Date Value Ref Range Status  . Sodium 12/06/2016 141  135 - 145 mEq/L Final  . Potassium 12/06/2016 3.6  3.5 - 5.1 mEq/L Final  . Chloride 12/06/2016 107  96 - 112 mEq/L Final  . CO2 12/06/2016 25  19 - 32 mEq/L Final  . Glucose, Bld 12/06/2016 110* 70 - 99  mg/dL Final  . BUN 12/06/2016 19  6 - 23 mg/dL Final  . Creatinine, Ser 12/06/2016 1.39  0.40 - 1.50 mg/dL Final  . Calcium 12/06/2016 9.6  8.4 - 10.5 mg/dL Final  . GFR 12/06/2016 63.43  >60.00 mL/min Final  . Fructosamine 12/06/2016 268  0 - 285 umol/L Final   Comment: Published reference interval for apparently healthy subjects between age 42 and 11 is 57 - 285 umol/L and in a poorly controlled diabetic population is 228 - 563 umol/L with a mean of 396 umol/L.      Allergies as of 12/10/2016   No Known Allergies     Medication List       Accurate as of 12/10/16  8:53 AM. Always use your most recent med list.          amLODipine 10 MG tablet Commonly known as:  NORVASC TAKE 1 TABLET DAILY   aspirin 81 MG tablet Take 81 mg by mouth daily.   budesonide-formoterol 160-4.5 MCG/ACT  inhaler Commonly known as:  SYMBICORT USE 2 INHALATIONS TWICE A DAY   clopidogrel 75 MG tablet Commonly known as:  PLAVIX Take 1 tablet (75 mg total) by mouth daily.   Dulaglutide 1.5 MG/0.5ML Sopn Commonly known as:  TRULICITY Inject weekly   freestyle lancets USE AS INSTRUCTED TO CHECK BLOOD SUGAR TWICE A DAY   FREESTYLE LITE test strip Generic drug:  glucose blood USE AS INSTRUCTED TO CHECK BLOOD SUGAR TWICE A DAY   glimepiride 4 MG tablet Commonly known as:  AMARYL TAKE ONE AND ONE-HALF TABLETS DAILY   losartan 100 MG tablet Commonly known as:  COZAAR Take 1 tablet (100 mg total) by mouth daily.   metFORMIN 1000 MG tablet Commonly known as:  GLUCOPHAGE TAKE 1 TABLET TWICE A DAY WITH MEALS   metoprolol succinate 100 MG 24 hr tablet Commonly known as:  TOPROL XL Take 1 tablet (100 mg total) by mouth daily.   multivitamin with minerals Tabs tablet Take 1 tablet by mouth daily.   simvastatin 20 MG tablet Commonly known as:  ZOCOR Take 1 tablet (20 mg total) by mouth at bedtime.   SPIRIVA HANDIHALER 18 MCG inhalation capsule Generic drug:  tiotropium INHALE THE CONTENTS OF 1 CAPSULE DAILY   Testosterone 20.25 MG/ACT (1.62%) Gel Place 4 application onto the skin daily.   triamterene-hydrochlorothiazide 37.5-25 MG tablet Commonly known as:  MAXZIDE-25 Take 1 tablet by mouth daily.       Allergies: No Known Allergies  Past Medical History:  Diagnosis Date  . CAD (coronary artery disease)    Two RCA stents remotely / 3rd RCA stent 2006  . Colon polyps    s/p several Cscopes.  Marland Kitchen COPD (chronic obstructive pulmonary disease) (HCC)    on O2, nocturnal  . Diabetes mellitus    dx aprox 2009  . ED (erectile dysfunction)    has a vacumm device  . Ejection fraction   . Hyperlipidemia    dx in 90s  . Hypertension    dx in the 90  . Hypogonadism male   . PVD (peripheral vascular disease) ([place])    s/p stents at LE 2009, Dr Einar Gip  . Shortness of breath     O2 Sat dropped to 82% walking on the treadmill, September, 2012    Past Surgical History:  Procedure Laterality Date  . APPENDECTOMY    . TONSILLECTOMY      Family History  Problem Relation Age of Onset  . Heart disease  Father   . Hypertension Father   . Stroke Father   . Diabetes Paternal Aunt   . Diabetes Maternal Grandmother   . Diabetes Other     GM, nephews, many family members  . Hyperlipidemia Other     ?  . Colon cancer Neg Hx   . Prostate cancer Neg Hx     Social History:  reports that he quit smoking about 39 years ago. His smoking use included Cigarettes. He has a 60.00 pack-year smoking history. He has never used smokeless tobacco. He reports that he drinks alcohol. He reports that he does not use drugs.    Review of Systems   The following is a copy of previous note:      Lipids: He has been treated with simvastatin 20 mg with good control       Lab Results  Component Value Date   CHOL 123 04/02/2016   HDL 35.10 (L) 04/02/2016   LDLCALC 69 04/02/2016   TRIG 91.0 04/02/2016   CHOLHDL 3 04/02/2016                      Has had erectile dysfunction for quite sometime.  Also had decreased libido previously and was found to have hypogonadism, probably in 2011. Original evaluation is not available.   He did have a slightly low free testosterone level in 2012 on treatment Prolactin level normal   Symptoms are controlled with AndroGel 4 pumps without any fatigue  On his last visit his testosterone level was just over 300 He now has been compliant with the testosterone gel   Lab Results  Component Value Date   TESTOSTERONE 433.04 10/08/2016        HYPERTENSION: This is followed by PCP and is on 100 mg of losartan along with Maxide, amlodipine and metoprolol  Blood pressure Appears controlled now   LABS:  Lab on 12/06/2016  Component Date Value Ref Range Status  . Sodium 12/06/2016 141  135 - 145 mEq/L Final  . Potassium 12/06/2016 3.6  3.5 - 5.1  mEq/L Final  . Chloride 12/06/2016 107  96 - 112 mEq/L Final  . CO2 12/06/2016 25  19 - 32 mEq/L Final  . Glucose, Bld 12/06/2016 110* 70 - 99 mg/dL Final  . BUN 12/06/2016 19  6 - 23 mg/dL Final  . Creatinine, Ser 12/06/2016 1.39  0.40 - 1.50 mg/dL Final  . Calcium 12/06/2016 9.6  8.4 - 10.5 mg/dL Final  . GFR 12/06/2016 63.43  >60.00 mL/min Final  . Fructosamine 12/06/2016 268  0 - 285 umol/L Final   Comment: Published reference interval for apparently healthy subjects between age 16 and 23 is 2 - 285 umol/L and in a poorly controlled diabetic population is 228 - 563 umol/L with a mean of 396 umol/L.     Physical Examination:  BP 130/80 (BP Location: Left Arm, Patient Position: Sitting)   Pulse 78   Wt 251 lb (113.9 kg)   SpO2 94%   BMI 34.04 kg/m      ASSESSMENT/PLAN:   Diabetes type 2, with obesity See history of present illness for detailed discussion of current diabetes management, blood sugar patterns and problems identified  His blood sugars appear to be significantly better with using Trulicity and also increased dose of metformin He also also been able to lose weight with the change Continues to be active with exercise regimen as before  Although his home blood sugars are not consistently near normal  his fasting reading in the lab was 110 His lowest blood sugars are before supper and evening, currently taking 6 mg of Amaryl in the morning  For now discussed continuing the same medications except reducing the Amaryl to 4 mg once a day His meter needs to be re-programmed to the correct date and time which was done today  A1c to be checked on the next visit  Discussed blood sugar targets at various times  AndroGel refilled   Patient Instructions  Glimeperide 1 in am only  Check blood sugars on waking up  2x weekly  Also check blood sugars about 2 hours after a meal and do this after different meals by rotation  Recommended blood sugar levels on waking  up is 90-130 and about 2 hours after meal is 130-160  Please bring your blood sugar monitor to each visit, thank you    [facility] 12/10/2016, 8:53 AM   Note: This office note was prepared with Dragon voice recognition system technology. Any transcriptional errors that result from this process are unintentional.

## 2016-12-10 ENCOUNTER — Encounter: Payer: Self-pay | Admitting: Endocrinology

## 2016-12-10 ENCOUNTER — Ambulatory Visit (INDEPENDENT_AMBULATORY_CARE_PROVIDER_SITE_OTHER): Payer: Medicare Other | Admitting: Endocrinology

## 2016-12-10 VITALS — BP 130/80 | HR 78 | Wt 251.0 lb

## 2016-12-10 DIAGNOSIS — E1165 Type 2 diabetes mellitus with hyperglycemia: Secondary | ICD-10-CM

## 2016-12-10 MED ORDER — TESTOSTERONE 20.25 MG/ACT (1.62%) TD GEL: 4.0000 "application " | Freq: Every day | TRANSDERMAL | 3 refills | Status: DC

## 2016-12-10 NOTE — Patient Instructions (Addendum)
Glimeperide 1 in am only  Check blood sugars on waking up  2x weekly  Also check blood sugars about 2 hours after a meal and do this after different meals by rotation  Recommended blood sugar levels on waking up is 90-130 and about 2 hours after meal is 130-160  Please bring your blood sugar monitor to each visit, thank you

## 2016-12-11 ENCOUNTER — Telehealth: Payer: Self-pay | Admitting: Internal Medicine

## 2016-12-11 NOTE — Telephone Encounter (Signed)
lmtcb X1 for pt  

## 2016-12-12 MED ORDER — BUDESONIDE-FORMOTEROL FUMARATE 160-4.5 MCG/ACT IN AERO: INHALATION_SPRAY | RESPIRATORY_TRACT | 0 refills | Status: DC

## 2016-12-12 NOTE — Telephone Encounter (Signed)
Spoke with pt, requesting [facility] day supply.  This has been sent.  Nothing further needed.

## 2016-12-26 ENCOUNTER — Ambulatory Visit (INDEPENDENT_AMBULATORY_CARE_PROVIDER_SITE_OTHER): Payer: Medicare Other | Admitting: Podiatry

## 2016-12-26 ENCOUNTER — Encounter: Payer: Self-pay | Admitting: Podiatry

## 2016-12-26 VITALS — BP 140/78 | HR 84

## 2016-12-26 DIAGNOSIS — M79672 Pain in left foot: Secondary | ICD-10-CM | POA: Diagnosis not present

## 2016-12-26 DIAGNOSIS — B351 Tinea unguium: Secondary | ICD-10-CM

## 2016-12-26 DIAGNOSIS — M2040 Other hammer toe(s) (acquired), unspecified foot: Secondary | ICD-10-CM

## 2016-12-26 DIAGNOSIS — M79671 Pain in right foot: Secondary | ICD-10-CM | POA: Diagnosis not present

## 2016-12-26 NOTE — Progress Notes (Signed)
Subjective:  81 year old Diabetic male presents requesting painful nails trimmed. Also has a painful corn on 5th digit right. No other new problems.  Objective:  Dermatologic: Thick dystrophic nails with fungal debris x 10.  Porokeratotic lesion over the 5th digit right symptomatic.  Vascular: All pedal pulses are palpable. Neurologic: All epicritic and tactile sensations grossly intact. Orthopedic: Contracted lesser digits bilateral with painful corn on 5th digit on right foot  Cavus type foot.   Assessment: Onychomycosis x 10.  Hammer toe deformities 2-5 bilateral.  Painful digital corns 5th right.   Treatment:  Debrided 5th digital corn and all nails x 10.  Return in 3 months or as needed.

## 2016-12-26 NOTE — Patient Instructions (Signed)
Seen for hypertrophic nails. All nails debrided. Return in 3 months or as needed.  

## 2017-01-03 ENCOUNTER — Other Ambulatory Visit: Payer: Self-pay | Admitting: Endocrinology

## 2017-01-15 ENCOUNTER — Encounter: Payer: Self-pay | Admitting: Physician Assistant

## 2017-01-15 ENCOUNTER — Ambulatory Visit (INDEPENDENT_AMBULATORY_CARE_PROVIDER_SITE_OTHER): Payer: Medicare Other | Admitting: Physician Assistant

## 2017-01-15 VITALS — BP 142/98 | HR 83 | Ht 74.0 in | Wt 255.8 lb

## 2017-01-15 DIAGNOSIS — E782 Mixed hyperlipidemia: Secondary | ICD-10-CM | POA: Diagnosis not present

## 2017-01-15 DIAGNOSIS — I2510 Atherosclerotic heart disease of native coronary artery without angina pectoris: Secondary | ICD-10-CM | POA: Diagnosis not present

## 2017-01-15 DIAGNOSIS — J449 Chronic obstructive pulmonary disease, unspecified: Secondary | ICD-10-CM

## 2017-01-15 DIAGNOSIS — I10 Essential (primary) hypertension: Secondary | ICD-10-CM | POA: Diagnosis not present

## 2017-01-15 NOTE — Patient Instructions (Signed)
Medication Instructions:    Your physician recommends that you continue on your current medications as directed. Please refer to the Current Medication list given to you today.   If you need a refill on your cardiac medications before your next appointment, please call your pharmacy.  Labwork: NONE ORDERED  TODAY    Testing/Procedures: NONE ORDERED  TODAY    Follow-Up: Your physician wants you to follow-up in:  IN 6  MONTHS WITH DR Johann Capers will receive a reminder letter in the mail two months in advance. If you don't receive a letter, please call our office to schedule the follow-up appointment.   YOU HAVE BEEN RECOMMENDED TO KEEP A RECORD OF BLOOD PRESSURE  FOR 2 WEEKS AND CALL OFFICE NURSE TRIAGE WITH RESULTS  Any Other Special Instructions Will Be Listed Below (If Applicable).   Low-Sodium Eating Plan Sodium, which is an element that makes up salt, helps you maintain a healthy balance of fluids in your body. Too much sodium can increase your blood pressure and cause fluid and waste to be held in your body. Your health care provider or dietitian may recommend following this plan if you have high blood pressure (hypertension), kidney disease, liver disease, or heart failure. Eating less sodium can help lower your blood pressure, reduce swelling, and protect your heart, liver, and kidneys. What are tips for following this plan? General guidelines   Most people on this plan should limit their sodium intake to 1,500-2,000 mg (milligrams) of sodium each day. Reading food labels   The Nutrition Facts label lists the amount of sodium in one serving of the food. If you eat more than one serving, you must multiply the listed amount of sodium by the number of servings.  Choose foods with less than 140 mg of sodium per serving.  Avoid foods with 300 mg of sodium or more per serving. Shopping   Look for lower-sodium products, often labeled as "low-sodium" or "no salt  added."  Always check the sodium content even if foods are labeled as "unsalted" or "no salt added".  Buy fresh foods.  Avoid canned foods and premade or frozen meals.  Avoid canned, cured, or processed meats  Buy breads that have less than 80 mg of sodium per slice. Cooking   Eat more home-cooked food and less restaurant, buffet, and fast food.  Avoid adding salt when cooking. Use salt-free seasonings or herbs instead of table salt or sea salt. Check with your health care provider or pharmacist before using salt substitutes.  Cook with plant-based oils, such as canola, sunflower, or olive oil. Meal planning   When eating at a restaurant, ask that your food be prepared with less salt or no salt, if possible.  Avoid foods that contain MSG (monosodium glutamate). MSG is sometimes added to [place] food, bouillon, and some canned foods. What foods are recommended? The items listed may not be a complete list. Talk with your dietitian about what dietary choices are best for you. Grains  Low-sodium cereals, including oats, puffed wheat and rice, and shredded wheat. Low-sodium crackers. Unsalted rice. Unsalted pasta. Low-sodium bread. Whole-grain breads and whole-grain pasta. Vegetables  Fresh or frozen vegetables. "No salt added" canned vegetables. "No salt added" tomato sauce and paste. Low-sodium or reduced-sodium tomato and vegetable juice. Fruits  Fresh, frozen, or canned fruit. Fruit juice. Meats and other protein foods  Fresh or frozen (no salt added) meat, poultry, seafood, and fish. Low-sodium canned tuna and salmon. Unsalted nuts. Dried  peas, beans, and lentils without added salt. Unsalted canned beans. Eggs. Unsalted nut butters. Dairy  Milk. Soy milk. Cheese that is naturally low in sodium, such as ricotta cheese, fresh mozzarella, or Swiss cheese Low-sodium or reduced-sodium cheese. Cream cheese. Yogurt. Fats and oils  Unsalted butter. Unsalted margarine with no trans fat.  Vegetable oils such as canola or olive oils. Seasonings and other foods  Fresh and dried herbs and spices. Salt-free seasonings. Low-sodium mustard and ketchup. Sodium-free salad dressing. Sodium-free light mayonnaise. Fresh or refrigerated horseradish. Lemon juice. Vinegar. Homemade, reduced-sodium, or low-sodium soups. Unsalted popcorn and pretzels. Low-salt or salt-free chips. What foods are not recommended? The items listed may not be a complete list. Talk with your dietitian about what dietary choices are best for you. Grains  Instant hot cereals. Bread stuffing, pancake, and biscuit mixes. Croutons. Seasoned rice or pasta mixes. Noodle soup cups. Boxed or frozen macaroni and cheese. Regular salted crackers. Self-rising flour. Vegetables  Sauerkraut, pickled vegetables, and relishes. Olives. [place] fries. Onion rings. Regular canned vegetables (not low-sodium or reduced-sodium). Regular canned tomato sauce and paste (not low-sodium or reduced-sodium). Regular tomato and vegetable juice (not low-sodium or reduced-sodium). Frozen vegetables in sauces. Meats and other protein foods  Meat or fish that is salted, canned, smoked, spiced, or pickled. Bacon, ham, sausage, hotdogs, corned beef, chipped beef, packaged lunch meats, salt pork, jerky, pickled herring, anchovies, regular canned tuna, sardines, salted nuts. Dairy  Processed cheese and cheese spreads. Cheese curds. Blue cheese. Feta cheese. String cheese. Regular cottage cheese. Buttermilk. Canned milk. Fats and oils  Salted butter. Regular margarine. Ghee. Bacon fat. Seasonings and other foods  Onion salt, garlic salt, seasoned salt, table salt, and sea salt. Canned and packaged gravies. Worcestershire sauce. Tartar sauce. Barbecue sauce. Teriyaki sauce. Soy sauce, including reduced-sodium. Steak sauce. Fish sauce. Oyster sauce. Cocktail sauce. Horseradish that you find on the shelf. Regular ketchup and mustard. Meat flavorings and  tenderizers. Bouillon cubes. Hot sauce and Tabasco sauce. Premade or packaged marinades. Premade or packaged taco seasonings. Relishes. Regular salad dressings. Salsa. Potato and tortilla chips. Corn chips and puffs. Salted popcorn and pretzels. Canned or dried soups. Pizza. Frozen entrees and pot pies. Summary  Eating less sodium can help lower your blood pressure, reduce swelling, and protect your heart, liver, and kidneys.  Most people on this plan should limit their sodium intake to 1,500-2,000 mg (milligrams) of sodium each day.  Canned, boxed, and frozen foods are high in sodium. Restaurant foods, fast foods, and pizza are also very high in sodium. You also get sodium by adding salt to food.  Try to cook at home, eat more fresh fruits and vegetables, and eat less fast food, canned, processed, or prepared foods. This information is not intended to replace advice given to you by your health care provider. Make sure you discuss any questions you have with your health care provider. Document Released: 02/23/2002 Document Revised: 08/27/2016 Document Reviewed: 08/27/2016 Elsevier Interactive Patient Education  2017 [employer].

## 2017-01-15 NOTE — Progress Notes (Signed)
Cardiology Office Note    Date:  01/15/2017   ID:  Jonathan Leather., DOB [birth year], MRN [id number]  PCP:  Kathlene November, MD  Cardiologist: Dr. Meda Coffee  Chief Complaint  Patient presents with  . Follow-up    seen for Dr. Meda Coffee    History of Present Illness:  Jonathan Liller. is a 81 y.o. male with history of CAD status post stenting 2 to the RCA remote past and the third RCA stent in  2006, negative stress test in 2012, LVEF 55% on echo 2015, COPD on home O2, hypertension, HLD, DM, PVD status post stents lower extremity 2009 Dr. Nadyne Coombes. Last saw Dr. Meda Coffee 07/25/16 and was doing well going to the gym on a regular basis.  Patient comes in today for six-month follow-up. 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 He has chronic dyspnea on exertion but rarely uses his oxygen. He denies any chest pain, tightness, dizziness or presyncope. His blood pressure is elevated today and he says his wife gave him salomi for breakfast. He checks his blood pressure 2-3 times per week and is usually about 120/76.    Past Medical History:  Diagnosis Date  . CAD (coronary artery disease)    Two RCA stents remotely / 3rd RCA stent 2006  . Colon polyps    s/p several Cscopes.  Marland Kitchen COPD (chronic obstructive pulmonary disease) (HCC)    on O2, nocturnal  . Diabetes mellitus    dx aprox 2009  . ED (erectile dysfunction)    has a vacumm device  . Ejection fraction   . Hyperlipidemia    dx in 90s  . Hypertension    dx in the 90  . Hypogonadism male   . PVD (peripheral vascular disease) ([name])    s/p stents at [place] 2009, Dr Einar Gip  . Shortness of breath    O2 Sat dropped to 82% walking on the treadmill, September, 2012    Past Surgical History:  Procedure Laterality Date  . APPENDECTOMY    . TONSILLECTOMY      Current Medications: Outpatient Medications Prior to Visit  Medication Sig Dispense Refill  . amLODipine (NORVASC) 10 MG tablet TAKE 1 TABLET DAILY  90 tablet 3  . aspirin 81 MG tablet Take 81 mg by mouth daily.      . budesonide-formoterol (SYMBICORT) 160-4.5 MCG/ACT inhaler USE 2 INHALATIONS TWICE A DAY 30.6 g 0  . clopidogrel (PLAVIX) 75 MG tablet Take 1 tablet (75 mg total) by mouth daily. 90 tablet 2  . Dulaglutide (TRULICITY) 1.5 FT/7.3UK SOPN Inject weekly 12 pen 1  . FREESTYLE LITE test strip USE AS INSTRUCTED TO CHECK BLOOD SUGAR TWICE A DAY 200 each 3  . glimepiride (AMARYL) 4 MG tablet TAKE ONE AND ONE-HALF TABLETS DAILY 135 tablet 1  . Lancets (FREESTYLE) lancets USE AS INSTRUCTED TO CHECK BLOOD SUGAR TWICE A DAY 200 each 1  . losartan (COZAAR) 100 MG tablet Take 1 tablet (100 mg total) by mouth daily. 90 tablet 2  . metFORMIN (GLUCOPHAGE) 1000 MG tablet TAKE 1 TABLET TWICE A DAY WITH MEALS 180 tablet 1  . metoprolol succinate (TOPROL XL) 100 MG 24 hr tablet Take 1 tablet (100 mg total) by mouth daily. 90 tablet 2  . Multiple Vitamin (MULTIVITAMIN WITH MINERALS) TABS tablet Take 1 tablet by mouth daily.    . simvastatin (ZOCOR) 20 MG tablet Take 1 tablet (20 mg total) by mouth  at bedtime. 90 tablet 0  . SPIRIVA HANDIHALER 18 MCG inhalation capsule INHALE THE CONTENTS OF 1 CAPSULE DAILY 90 capsule 3  . Testosterone 20.25 MG/ACT (1.62%) GEL Place 4 application onto the skin daily. 225 g 3  . triamterene-hydrochlorothiazide (MAXZIDE-25) 37.5-25 MG tablet Take 1 tablet by mouth daily. 90 tablet 2   No facility-administered medications prior to visit.      Allergies:   Patient has no known allergies.   Social History   Social History  . Marital status: Married    Spouse name: Cerrella   . Number of children: 6  . Years of education: N/A   Occupational History  . 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 Smoking status: Former Smoker    Packs/day: 2.00    Years: 30.00    Types: Cigarettes    Quit date: 06/17/1977  . Smokeless tobacco: Never Used     Comment: 2 ppd, quit 1987  .  Alcohol use 0.0 oz/week     Comment: socially   . Drug use: No  . Sexual activity: Not Asked   Other Topics Concern  . None   Social History Narrative   Has 2 step sons, and 4 children (lost 1 son)                     Family History:  The patient's   family history includes Diabetes in his maternal grandmother, other, and paternal aunt; Heart disease in his father; Hyperlipidemia in his other; Hypertension in his father; Stroke in his father.   ROS:   Please see the history of present illness.    Review of Systems  Constitution: Negative.  HENT: Negative.   Cardiovascular: Negative.   Respiratory: Positive for wheezing.   Endocrine: Negative.   Hematologic/Lymphatic: Negative.   Musculoskeletal: Positive for back pain.  Gastrointestinal: Negative.   Genitourinary: Negative.   Neurological: Negative.    All other systems reviewed and are negative.   PHYSICAL EXAM:   VS:  BP (!) 142/98   Pulse 83   Ht 6\' 2"  (1.88 m)   Wt 255 lb 12.8 oz (116 kg)   SpO2 92%   BMI 32.84 kg/m   Physical Exam  GEN: Well nourished, well developed, in no acute distress  Neck: no JVD, carotid bruits, or masses Cardiac:RRR; Positive S4 no murmurs, rubs Respiratory:  Decreased breath sounds but clear to auscultation bilaterally, normal work of breathing GI: soft, nontender, nondistended, + BS Ext: without cyanosis, clubbing, or edema, Good distal pulses bilaterally Neuro:  Alert and Oriented x 3 Psych: euthymic mood, full affect  Wt Readings from Last 3 Encounters:  01/15/17 255 lb 12.8 oz (116 kg)  12/10/16 251 lb (113.9 kg)  10/12/16 256 lb (116.1 kg)      Studies/Labs Reviewed:   EKG:  EKG is not ordered today.     Recent Labs: 04/02/2016: Hemoglobin 18.0 Repeated and verified X2.; Platelets 208.0 10/08/2016: ALT 21 12/06/2016: BUN 19; Creatinine, Ser 1.39; Potassium 3.6; Sodium 141   Lipid Panel    Component Value Date/Time   CHOL 123 04/02/2016 0951   TRIG 91.0  04/02/2016 0951   HDL 35.10 (L) 04/02/2016 0951   CHOLHDL 3 04/02/2016 0951   VLDL 18.2 04/02/2016 0951   LDLCALC 69 04/02/2016 0951    Additional studies/ records that were reviewed today include:  2-D echo 2015Study Conclusions  - Left ventricle: The  cavity size was normal. Wall thickness   was normal. Systolic function was normal. The estimated   ejection fraction was 50%, in the range of 50% to 55%.   Wall motion was normal; there were no regional wall motion   abnormalities. Doppler parameters are consistent with   abnormal left ventricular relaxation (grade 1 diastolic   dysfunction). - Ascending aorta: The ascending aorta was mildly dilated. - Left atrium: The atrium was mildly dilated. Impressions:  - Low normal LV function; RV function normal; no TR jet   available to estimate pulmonary pressures. Transthoracic echocardiography.  M-mode, complete 2D,     ASSESSMENT:    1. Coronary artery disease involving native coronary artery of native heart without angina pectoris   2. Essential hypertension   3. Mixed hyperlipidemia   4. COPD mixed type ([place])      PLAN:  In order of problems listed above:  CAD with remote stents on Plavix and aspirin. Doing well without angina. Chronic dyspnea on exertion unchanged.  Essential hypertension patient's blood pressure is elevated today. I retook it and it was 160/90. Patient usually has blood pressure is 120/76.He did have salami for breakfast. Patient says he'll check his blood pressure daily for 2 weeks and calls with results. 2 g sodium diet given. He is on maximum Norvasc, Cozaar, Toprol, and Maxide.  Mixed hyperlipidemia on Zocor. He will need fasting lipid panel and LFTs in July. This is made by Dr. Larose Kells  COPD rarely uses home O2    Medication Adjustments/Labs and Tests Ordered: Current medicines are reviewed at length with the patient today.  Concerns regarding medicines are outlined above.  Medication changes, Labs  and Tests ordered today are listed in the Patient Instructions below. Patient Instructions  Medication Instructions:    Your physician recommends that you continue on your current medications as directed. Please refer to the Current Medication list given to you today.   If you need a refill on your cardiac medications before your next appointment, please call your pharmacy.  Labwork: NONE ORDERED  TODAY    Testing/Procedures: NONE ORDERED  TODAY    Follow-Up: Your physician wants you to follow-up in:  IN 6  MONTHS WITH DR Johann Capers will receive a reminder letter in the mail two months in advance. If you don't receive a letter, please call our office to schedule the follow-up appointment.   YOU HAVE BEEN RECOMMENDED TO KEEP A RECORD OF BLOOD PRESSURE  FOR 2 WEEKS AND CALL OFFICE NURSE TRIAGE WITH RESULTS  Any Other Special Instructions Will Be Listed Below (If Applicable).   Low-Sodium Eating Plan Sodium, which is an element that makes up salt, helps you maintain a healthy balance of fluids in your body. Too much sodium can increase your blood pressure and cause fluid and waste to be held in your body. Your health care provider or dietitian may recommend following this plan if you have high blood pressure (hypertension), kidney disease, liver disease, or heart failure. Eating less sodium can help lower your blood pressure, reduce swelling, and protect your heart, liver, and kidneys. What are tips for following this plan? General guidelines   Most people on this plan should limit their sodium intake to 1,500-2,000 mg (milligrams) of sodium each day. Reading food labels   The Nutrition Facts label lists the amount of sodium in one serving of the food. If you eat more than one serving, you must multiply the listed amount of sodium by the  number of servings.  Choose foods with less than 140 mg of sodium per serving.  Avoid foods with 300 mg of sodium or more per serving. Shopping    Look for lower-sodium products, often labeled as "low-sodium" or "no salt added."  Always check the sodium content even if foods are labeled as "unsalted" or "no salt added".  Buy fresh foods.  Avoid canned foods and premade or frozen meals.  Avoid canned, cured, or processed meats  Buy breads that have less than 80 mg of sodium per slice. Cooking   Eat more home-cooked food and less restaurant, buffet, and fast food.  Avoid adding salt when cooking. Use salt-free seasonings or herbs instead of table salt or sea salt. Check with your health care provider or pharmacist before using salt substitutes.  Cook with plant-based oils, such as canola, sunflower, or olive oil. Meal planning   When eating at a restaurant, ask that your food be prepared with less salt or no salt, if possible.  Avoid foods that contain MSG (monosodium glutamate). MSG is sometimes added to [place] food, bouillon, and some canned foods. What foods are recommended? The items listed may not be a complete list. Talk with your dietitian about what dietary choices are best for you. Grains  Low-sodium cereals, including oats, puffed wheat and rice, and shredded wheat. Low-sodium crackers. Unsalted rice. Unsalted pasta. Low-sodium bread. Whole-grain breads and whole-grain pasta. Vegetables  Fresh or frozen vegetables. "No salt added" canned vegetables. "No salt added" tomato sauce and paste. Low-sodium or reduced-sodium tomato and vegetable juice. Fruits  Fresh, frozen, or canned fruit. Fruit juice. Meats and other protein foods  Fresh or frozen (no salt added) meat, poultry, seafood, and fish. Low-sodium canned tuna and salmon. Unsalted nuts. Dried peas, beans, and lentils without added salt. Unsalted canned beans. Eggs. Unsalted nut butters. Dairy  Milk. Soy milk. Cheese that is naturally low in sodium, such as ricotta cheese, fresh mozzarella, or Swiss cheese Low-sodium or reduced-sodium cheese. Cream cheese.  Yogurt. Fats and oils  Unsalted butter. Unsalted margarine with no trans fat. Vegetable oils such as canola or olive oils. Seasonings and other foods  Fresh and dried herbs and spices. Salt-free seasonings. Low-sodium mustard and ketchup. Sodium-free salad dressing. Sodium-free light mayonnaise. Fresh or refrigerated horseradish. Lemon juice. Vinegar. Homemade, reduced-sodium, or low-sodium soups. Unsalted popcorn and pretzels. Low-salt or salt-free chips. What foods are not recommended? The items listed may not be a complete list. Talk with your dietitian about what dietary choices are best for you. Grains  Instant hot cereals. Bread stuffing, pancake, and biscuit mixes. Croutons. Seasoned rice or pasta mixes. Noodle soup cups. Boxed or frozen macaroni and cheese. Regular salted crackers. Self-rising flour. Vegetables  Sauerkraut, pickled vegetables, and relishes. Olives. [place] fries. Onion rings. Regular canned vegetables (not low-sodium or reduced-sodium). Regular canned tomato sauce and paste (not low-sodium or reduced-sodium). Regular tomato and vegetable juice (not low-sodium or reduced-sodium). Frozen vegetables in sauces. Meats and other protein foods  Meat or fish that is salted, canned, smoked, spiced, or pickled. Bacon, ham, sausage, hotdogs, corned beef, chipped beef, packaged lunch meats, salt pork, jerky, pickled herring, anchovies, regular canned tuna, sardines, salted nuts. Dairy  Processed cheese and cheese spreads. Cheese curds. Blue cheese. Feta cheese. String cheese. Regular cottage cheese. Buttermilk. Canned milk. Fats and oils  Salted butter. Regular margarine. Ghee. Bacon fat. Seasonings and other foods  Onion salt, garlic salt, seasoned salt, table salt, and sea salt. Canned and packaged gravies. Worcestershire  sauce. Tartar sauce. Barbecue sauce. Teriyaki sauce. Soy sauce, including reduced-sodium. Steak sauce. Fish sauce. Oyster sauce. Cocktail sauce. Horseradish that you  find on the shelf. Regular ketchup and mustard. Meat flavorings and tenderizers. Bouillon cubes. Hot sauce and Tabasco sauce. Premade or packaged marinades. Premade or packaged taco seasonings. Relishes. Regular salad dressings. Salsa. Potato and tortilla chips. Corn chips and puffs. Salted popcorn and pretzels. Canned or dried soups. Pizza. Frozen entrees and pot pies. Summary  Eating less sodium can help lower your blood pressure, reduce swelling, and protect your heart, liver, and kidneys.  Most people on this plan should limit their sodium intake to 1,500-2,000 mg (milligrams) of sodium each day.  Canned, boxed, and frozen foods are high in sodium. Restaurant foods, fast foods, and pizza are also very high in sodium. You also get sodium by adding salt to food.  Try to cook at home, eat more fresh fruits and vegetables, and eat less fast food, canned, processed, or prepared foods. This information is not intended to replace advice given to you by your health care provider. Make sure you discuss any questions you have with your health care provider. Document Released: 02/23/2002 Document Revised: 08/27/2016 Document Reviewed: 08/27/2016 Elsevier Interactive Patient Education  2017 [place], Ermalinda Barrios, [place]  01/15/2017 11:29 AM    [facility] [address] [place], [place], [place]  [address] Phone: [phone]; Fax: [phone]

## 2017-01-24 ENCOUNTER — Ambulatory Visit (INDEPENDENT_AMBULATORY_CARE_PROVIDER_SITE_OTHER): Payer: Medicare Other | Admitting: Internal Medicine

## 2017-01-24 ENCOUNTER — Encounter: Payer: Self-pay | Admitting: Internal Medicine

## 2017-01-24 VITALS — BP 128/74 | HR 89 | Ht 73.0 in | Wt 248.0 lb

## 2017-01-24 DIAGNOSIS — J449 Chronic obstructive pulmonary disease, unspecified: Secondary | ICD-10-CM | POA: Diagnosis not present

## 2017-01-24 DIAGNOSIS — D751 Secondary polycythemia: Secondary | ICD-10-CM

## 2017-01-24 DIAGNOSIS — I2510 Atherosclerotic heart disease of native coronary artery without angina pectoris: Secondary | ICD-10-CM

## 2017-01-24 DIAGNOSIS — R252 Cramp and spasm: Secondary | ICD-10-CM

## 2017-01-24 DIAGNOSIS — R0609 Other forms of dyspnea: Secondary | ICD-10-CM

## 2017-01-24 MED ORDER — BUDESONIDE-FORMOTEROL FUMARATE 160-4.5 MCG/ACT IN AERO: 2.0000 | INHALATION_SPRAY | Freq: Two times a day (BID) | RESPIRATORY_TRACT | 0 refills | Status: DC

## 2017-01-24 MED ORDER — UMECLIDINIUM-VILANTEROL 62.5-25 MCG/INH IN AEPB: 1.0000 | INHALATION_SPRAY | Freq: Every day | RESPIRATORY_TRACT | 0 refills | Status: DC

## 2017-01-24 MED ORDER — BUDESONIDE-FORMOTEROL FUMARATE 160-4.5 MCG/ACT IN AERO: INHALATION_SPRAY | RESPIRATORY_TRACT | 12 refills | Status: DC

## 2017-01-24 NOTE — Assessment & Plan Note (Signed)
He describes was very occasional leg cramp at night which he walks off.

## 2017-01-24 NOTE — Progress Notes (Signed)
HPI male former smoker followed for dyspnea/labored breathing, complicated by CAD/MI/ Stents/ PAD, DM PFT- 07/10/11-  Normal spirometry flows with small- airway response to bronchodilator, normal lung volumes and moderately reduced diffusion. FEV1/FVC 0.79, DLCO 58% 6MWT -06/18/12- 96%, 88%, 94% 449 M. Significant desaturation with exercise.  -----------------------------------------------------------------------------------------------------------------------------   12/12/2015-81 year old male former smoker followed for dyspnea/labored breathing, complicated by CAD/MI, DM O2 2 L/APS-sleep and exertion Follows for: COPD. Pt c/o minimal SOB, some wheeze. Pt denies CP/tightness/cough. Pt has been going to the gym regularly and feels it has helped him a lot. Pt does not use albuterol HFA on a regular basis.  He reports only minimal awareness of dyspnea on exertion. Uses oxygen for sleep intermittently as needed. No recent cardiac events or acute respiratory infections. Continues Symbicort daily but has rarely needed rescue inhaler anymore. No routine cough or phlegm, chest pain, palpitations or fever.  01/24/17- 81 year old male former smoker followed for dyspnea/labored breathing, complicated by CAD/MI/ stents/ PAD, DM O2 2 L/APS-sleep and exertion FOLLOWS FOR: Pt states he stays SOB all the time no matter. Pt would like to go back on Symbicort instead of Advair-can tell a huge difference in breathing. He tells me what he has is stable dyspnea on exertion, not supine or sitting at rest. He is not using his oxygen at all. I noted that hemoglobin 18 recorded over the past couple of years, which he says he was not aware of. Admits a little wheeze occasionally but no coughing. CXR 12/12/15 IMPRESSION: No acute cardiopulmonary abnormality. Polycythemia noted 04/02/16-hemoglobin 18  ROS-see HPI Constitutional:   No-   weight loss, night sweats, fevers, chills, fatigue, lassitude. HEENT:   No-  headaches,  difficulty swallowing, tooth/dental problems, sore throat,       No-  sneezing, itching, ear ache, nasal congestion, post nasal drip,  CV:  No-   chest pain, orthopnea, PND, swelling in lower extremities, anasarca, dizziness, palpitations Resp:   + "shortness of breath with exertion or at rest.              No-   productive cough,  No non-productive cough,  No- coughing up of blood.              No-   change in color of mucus.  + wheezing.   Skin: No-   rash or lesions. GI:  No-   heartburn, indigestion, abdominal pain, nausea, vomiting, GU:  MS:  No-   joint pain or swelling.   Neuro-     nothing unusual Psych:  No- change in mood or affect. No depression or anxiety.  No memory loss.  OBJ- Physical Exam    rest on room air  94% General- Alert, Oriented, Affect-appropriate, Distress- none acute.  Skin- rash-none, lesions- none, excoriation- none Lymphadenopathy- none Head- atraumatic            Eyes- Gross vision intact, PERRLA, conjunctivae and secretions clear            Ears- Hearing, canals-normal            Nose- Clear, no-Septal dev, mucus, polyps, erosion, perforation             Throat- Mallampati II , mucosa clear , drainage- none, tonsils- atrophic Neck- flexible , trachea midline, no stridor , thyroid nl, carotid no bruit Chest - symmetrical excursion , unlabored           Heart/CV- RRR , no murmur , no gallop  , no rub, nl s1  s2                           - JVD+ 1 cm , edema- none, stasis changes- none, varices- none           Lung- + faint rhonchi right base-transient, unlabored, cough-none , dullness-none, rub- none           Chest wall-  Abd-  Br/ Gen/ Rectal- Not done, not indicated Extrem- cyanosis- none, clubbing?- Question mild spooning of nails; atrophy- none, strength- nl Neuro- grossly intact to observation

## 2017-01-24 NOTE — Assessment & Plan Note (Signed)
PFT in 2012 demonstrated reduced diffusion capacity which likely reflects prior smoking although spirometry flow rates were still normal. He has no history of venous thromboembolic disease. His peripheral artery disease and his polycythemia can contribute to dyspnea. Since he is not using his oxygen, I first want to reassess the status of his overnight oximetry to see if he is desaturating significantly there. That helped and consideration of how to manage polycythemia. Eventually he will need updated CBC, PFT, and possibly a ventilation perfusion lung scan for chronic thromboembolism if suspected.

## 2017-01-24 NOTE — Patient Instructions (Signed)
Sample and script for Symbicort 160    Inhale 2 puffs then rinse mouth, twice daily     Try this instead of Advair. If it works much better, let us know and we can try to get a Prior Authorization approved by your insurance to cover it.   Sample Anoro Ellipta inhaler   Inhale 1 puff, once daily  Try this first, instead of Advair, and before going back to Symbicort.  See how it does.  Order- Overnight oximetry on room air     Dx polycythemia  Please call as needed

## 2017-01-24 NOTE — Assessment & Plan Note (Signed)
He doesn't think Advair works for him and says Symbicort had seemed to work much better in the past, but wasn't covered at that time by his insurance. He asks for prior authorization. Plan-he'll compare Anoro to Symbicort

## 2017-01-31 ENCOUNTER — Telehealth: Payer: Self-pay | Admitting: Internal Medicine

## 2017-01-31 MED ORDER — UMECLIDINIUM-VILANTEROL 62.5-25 MCG/INH IN AEPB
1.0000 | INHALATION_SPRAY | Freq: Every day | RESPIRATORY_TRACT | 3 refills | Status: DC
Start: 2017-01-31 — End: 2017-05-06

## 2017-01-31 NOTE — Telephone Encounter (Signed)
Ok for 90 day, reill x 3

## 2017-01-31 NOTE — Telephone Encounter (Signed)
Sample of Anoro worked for pt.  Please advise if ok to send 90 day rx  No Known Allergies  Current Outpatient Prescriptions on File Prior to Visit  Medication Sig Dispense Refill  . amLODipine (NORVASC) 10 MG tablet TAKE 1 TABLET DAILY 90 tablet 3  . aspirin 81 MG tablet Take 81 mg by mouth daily.      . budesonide-formoterol (SYMBICORT) 160-4.5 MCG/ACT inhaler USE 2 INHALATIONS then rinse mouth, twice daily 30.6 g 12  . budesonide-formoterol (SYMBICORT) 160-4.5 MCG/ACT inhaler Inhale 2 puffs into the lungs 2 (two) times daily. 1 Inhaler 0  . clopidogrel (PLAVIX) 75 MG tablet Take 1 tablet (75 mg total) by mouth daily. 90 tablet 2  . Dulaglutide (TRULICITY) 1.5 WT/8.8EK SOPN Inject weekly 12 pen 1  . Fluticasone-Salmeterol (ADVAIR) 250-50 MCG/DOSE AEPB Inhale 1 puff into the lungs 2 (two) times daily.    Marland Kitchen FREESTYLE LITE test strip USE AS INSTRUCTED TO CHECK BLOOD SUGAR TWICE A DAY 200 each 3  . glimepiride (AMARYL) 4 MG tablet TAKE ONE AND ONE-HALF TABLETS DAILY 135 tablet 1  . Lancets (FREESTYLE) lancets USE AS INSTRUCTED TO CHECK BLOOD SUGAR TWICE A DAY 200 each 1  . losartan (COZAAR) 100 MG tablet Take 1 tablet (100 mg total) by mouth daily. 90 tablet 2  . metFORMIN (GLUCOPHAGE) 1000 MG tablet TAKE 1 TABLET TWICE A DAY WITH MEALS 180 tablet 1  . metoprolol succinate (TOPROL XL) 100 MG 24 hr tablet Take 1 tablet (100 mg total) by mouth daily. 90 tablet 2  . Multiple Vitamin (MULTIVITAMIN WITH MINERALS) TABS tablet Take 1 tablet by mouth daily.    . simvastatin (ZOCOR) 20 MG tablet Take 1 tablet (20 mg total) by mouth at bedtime. 90 tablet 0  . SPIRIVA HANDIHALER 18 MCG inhalation capsule INHALE THE CONTENTS OF 1 CAPSULE DAILY 90 capsule 3  . Testosterone 20.25 MG/ACT (1.62%) GEL Place 4 application onto the skin daily. 225 g 3  . triamterene-hydrochlorothiazide (MAXZIDE-25) 37.5-25 MG tablet Take 1 tablet by mouth daily. 90 tablet 2  . umeclidinium-vilanterol (ANORO ELLIPTA) 62.5-25  MCG/INH AEPB Inhale 1 puff into the lungs daily. 1 each 0  . [DISCONTINUED] sitaGLIPtan (JANUVIA) 100 MG tablet Take 1 tablet (100 mg total) by mouth daily. 90 tablet 1   No current facility-administered medications on file prior to visit.

## 2017-01-31 NOTE — Telephone Encounter (Signed)
rx sent to preferred pharmacy.  Pt aware.  Nothing further needed.  

## 2017-02-01 ENCOUNTER — Telehealth: Payer: Self-pay | Admitting: *Deleted

## 2017-02-01 NOTE — Telephone Encounter (Signed)
Pt brought a log of his BP readings into the office for Dr Meda Coffee to review and advise if needed.   Pt was told to log his BP x 2 weeks, per Estella Husk PA-C.  Below are the recorded readings:   5/3---120/76  5/4---134/85  5/5---127/83  5/6---139/75  5/7---140/78  5/8---122/75  5/9---136/79  5/10---120/78  5/11---140/79  5/12---129/74  5/13---123/79  5/14---143/79  5/15---131/83  5/16---148/77   Dr Meda Coffee reviewed the pts BP's and states these are acceptable readings, and he should continue his current med regimen.  Endorsed this to the pt and he verbalized understanding and agrees with this plan.

## 2017-02-06 ENCOUNTER — Ambulatory Visit (INDEPENDENT_AMBULATORY_CARE_PROVIDER_SITE_OTHER): Payer: Medicare Other | Admitting: Internal Medicine

## 2017-02-06 ENCOUNTER — Encounter: Payer: Self-pay | Admitting: Internal Medicine

## 2017-02-06 VITALS — BP 124/72 | HR 76 | Temp 98.2°F | Resp 14 | Ht 73.0 in | Wt 250.1 lb

## 2017-02-06 DIAGNOSIS — I2510 Atherosclerotic heart disease of native coronary artery without angina pectoris: Secondary | ICD-10-CM | POA: Diagnosis not present

## 2017-02-06 DIAGNOSIS — D751 Secondary polycythemia: Secondary | ICD-10-CM | POA: Diagnosis not present

## 2017-02-06 DIAGNOSIS — I10 Essential (primary) hypertension: Secondary | ICD-10-CM | POA: Diagnosis not present

## 2017-02-06 DIAGNOSIS — E782 Mixed hyperlipidemia: Secondary | ICD-10-CM

## 2017-02-06 NOTE — Progress Notes (Signed)
Pre visit review using our clinic review tool, if applicable. No additional management support is needed unless otherwise documented below in the visit note. 

## 2017-02-06 NOTE — Patient Instructions (Signed)
  GO TO THE FRONT DESK Schedule labs to be done within few days, fasting  Schedule your next appointment for a checkup in 6 months

## 2017-02-06 NOTE — Progress Notes (Signed)
Subjective:    Patient ID: Jonathan Leather., male    DOB: 08-23-1936, 81 y.o.   MRN: [id number]  DOS:  02/06/2017 Type of visit - description : ROV Interval history: Note from pulmonology reviewed. HTN: Good med compliance , ambulatory BP satisfactory ED: Still an issue, we'll see urology soon.   Review of Systems Denies chest pain. No nausea, vomiting or diarrhea No major problems with cough. + Stress, wife is having issues with memory. Counseled.  Past Medical History:  Diagnosis Date  . CAD (coronary artery disease)    Two RCA stents remotely / 3rd RCA stent 2006  . Colon polyps    s/p several Cscopes.  Marland Kitchen COPD (chronic obstructive pulmonary disease) ([facility])    on O2, nocturnal  . Diabetes mellitus    dx aprox 2009  . ED (erectile dysfunction)    has a vacumm device  . Ejection fraction   . Hyperlipidemia    dx in 90s  . Hypertension    dx in the 90  . Hypogonadism male   . PVD (peripheral vascular disease) ([place])    s/p stents at LE 2009, Dr Einar Gip  . Shortness of breath    O2 Sat dropped to 82% walking on the treadmill, September, 2012    Past Surgical History:  Procedure Laterality Date  . APPENDECTOMY    . TONSILLECTOMY      Social History   Social History  . Marital status: Married    Spouse name: Jonathan [name]   . Number of children: 6  . Years of education: N/A   Occupational History  . 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 Smoking status: Former Smoker    Packs/day: 2.00    Years: 30.00    Types: Cigarettes    Quit date: 06/17/1977  . Smokeless tobacco: Never Used     Comment: 2 ppd, quit 1987  . Alcohol use 0.0 oz/week     Comment: socially   . Drug use: No  . Sexual activity: Not on file   Other Topics Concern  . Not on file   Social History Narrative   Has 2 step sons, and 4 children (lost 1 son)                      Allergies as of 02/06/2017   No Known Allergies     Medication List         Accurate as of 02/06/17 11:59 PM. Always use your most recent med list.          amLODipine 10 MG tablet Commonly known as:  NORVASC TAKE 1 TABLET DAILY   aspirin 81 MG tablet Take 81 mg by mouth daily.   budesonide-formoterol 160-4.5 MCG/ACT inhaler Commonly known as:  SYMBICORT Inhale 2 puffs into the lungs 2 (two) times daily.   clopidogrel 75 MG tablet Commonly known as:  PLAVIX Take 1 tablet (75 mg total) by mouth daily.   Dulaglutide 1.5 MG/0.5ML Sopn Commonly known as:  TRULICITY Inject weekly   Fluticasone-Salmeterol 250-50 MCG/DOSE Aepb Commonly known as:  ADVAIR Inhale 1 puff into the lungs 2 (two) times daily.   freestyle lancets USE AS INSTRUCTED TO CHECK BLOOD SUGAR TWICE A DAY   FREESTYLE LITE test strip Generic drug:  glucose blood USE AS INSTRUCTED TO CHECK BLOOD SUGAR TWICE A DAY   glimepiride 4 MG tablet Commonly known as:  AMARYL TAKE ONE AND ONE-HALF TABLETS DAILY   losartan 100 MG tablet Commonly known as:  COZAAR Take 1 tablet (100 mg total) by mouth daily.   metFORMIN 1000 MG tablet Commonly known as:  GLUCOPHAGE TAKE 1 TABLET TWICE A DAY WITH MEALS   metoprolol succinate 100 MG 24 hr tablet Commonly known as:  TOPROL XL Take 1 tablet (100 mg total) by mouth daily.   multivitamin with minerals Tabs tablet Take 1 tablet by mouth daily.   simvastatin 20 MG tablet Commonly known as:  ZOCOR Take 1 tablet (20 mg total) by mouth at bedtime.   SPIRIVA HANDIHALER 18 MCG inhalation capsule Generic drug:  tiotropium INHALE THE CONTENTS OF 1 CAPSULE DAILY   Testosterone 20.25 MG/ACT (1.62%) Gel Place 4 application onto the skin daily.   triamterene-hydrochlorothiazide 37.5-25 MG tablet Commonly known as:  MAXZIDE-25 Take 1 tablet by mouth daily.   umeclidinium-vilanterol 62.5-25 MCG/INH Aepb Commonly known as:  ANORO ELLIPTA Inhale 1 puff into the lungs daily.          Objective:   Physical Exam BP 124/72 (BP Location:  Left Arm, Patient Position: Sitting, Cuff Size: Normal)   Pulse 76   Temp 98.2 F (36.8 C) (Oral)   Resp 14   Ht 6\' 1"  (1.854 m)   Wt 250 lb 2 oz (113.5 kg)   SpO2 96%   BMI 33.00 kg/m  General:   Well developed, well nourished . NAD.  HEENT:  Normocephalic . Face symmetric, atraumatic Lungs:  CTA B Normal respiratory effort, no intercostal retractions, no accessory muscle use. Heart: RRR,  no murmur.  No pretibial edema bilaterally  Skin: Not pale. Not jaundice Neurologic:  alert & oriented X3.  Speech normal, gait appropriate for age and unassisted Psych--  Cognition and judgment appear intact.  Cooperative with normal attention span and concentration.  Behavior appropriate. No anxious or depressed appearing.      Assessment & Plan:   Assessment DM dx ~3846, complicated by CAD, PVD, mild neuropathy. Sees Dr. Dwyane Dee Hypogonadism. Sees Dr. Dwyane Dee HTN Hyperlipidemia COPD, nocturnal O2 prn Back pain, chronic: See visit 10/12/2016 CV: -CAD, Dr. Ron Parker Dr Meda Coffee -Peripheral vascular disease, stents lower extremity 2009  ED  PLAN   HTN: Well-controlled on losartan, Toprol, Maxzide. Last BMP satisfactory Hyperlipidemia: Continue with simvastatin, come back fasting within few days for FLP. Last LFTs very good Polycythemia: Undergoing a workup by pulmonology, they are doing overnight oximetry. Check a CBC DOE: Saw pulmonology with DOE, they are working on his inhalers. RTC 6 months

## 2017-02-07 ENCOUNTER — Encounter: Payer: Self-pay | Admitting: Internal Medicine

## 2017-02-07 DIAGNOSIS — D751 Secondary polycythemia: Secondary | ICD-10-CM | POA: Diagnosis not present

## 2017-02-07 NOTE — Assessment & Plan Note (Signed)
HTN: Well-controlled on losartan, Toprol, Maxzide. Last BMP satisfactory Hyperlipidemia: Continue with simvastatin, come back fasting within few days for FLP. Last LFTs very good Polycythemia: Undergoing a workup by pulmonology, they are doing overnight oximetry. Check a CBC DOE: Saw pulmonology with DOE, they are working on his inhalers. RTC 6 months

## 2017-02-10 ENCOUNTER — Other Ambulatory Visit: Payer: Self-pay | Admitting: Endocrinology

## 2017-02-13 ENCOUNTER — Other Ambulatory Visit (INDEPENDENT_AMBULATORY_CARE_PROVIDER_SITE_OTHER): Payer: Medicare Other

## 2017-02-13 DIAGNOSIS — N401 Enlarged prostate with lower urinary tract symptoms: Secondary | ICD-10-CM | POA: Diagnosis not present

## 2017-02-13 DIAGNOSIS — E782 Mixed hyperlipidemia: Secondary | ICD-10-CM

## 2017-02-13 DIAGNOSIS — D751 Secondary polycythemia: Secondary | ICD-10-CM | POA: Diagnosis not present

## 2017-02-13 DIAGNOSIS — E291 Testicular hypofunction: Secondary | ICD-10-CM | POA: Diagnosis not present

## 2017-02-13 DIAGNOSIS — R350 Frequency of micturition: Secondary | ICD-10-CM | POA: Diagnosis not present

## 2017-02-13 LAB — CBC WITH DIFFERENTIAL/PLATELET
BASOS ABS: 0.1 10*3/uL (ref 0.0–0.1)
Basophils Relative: 0.3 % (ref 0.0–3.0)
EOS ABS: 0.1 10*3/uL (ref 0.0–0.7)
Eosinophils Relative: 0.5 % (ref 0.0–5.0)
HCT: 52.8 % — ABNORMAL HIGH (ref 39.0–52.0)
HEMOGLOBIN: 17.5 g/dL — AB (ref 13.0–17.0)
LYMPHS PCT: 9.4 % — AB (ref 12.0–46.0)
Lymphs Abs: 1.6 10*3/uL (ref 0.7–4.0)
MCHC: 33.1 g/dL (ref 30.0–36.0)
MCV: 100.1 fl — ABNORMAL HIGH (ref 78.0–100.0)
Monocytes Absolute: 1.5 10*3/uL — ABNORMAL HIGH (ref 0.1–1.0)
Monocytes Relative: 8.7 % (ref 3.0–12.0)
Neutro Abs: 13.6 10*3/uL — ABNORMAL HIGH (ref 1.4–7.7)
Neutrophils Relative %: 81.1 % — ABNORMAL HIGH (ref 43.0–77.0)
Platelets: 157 10*3/uL (ref 150.0–400.0)
RBC: 5.28 Mil/uL (ref 4.22–5.81)
RDW: 15.2 % (ref 11.5–15.5)
WBC: 16.8 10*3/uL — AB (ref 4.0–10.5)

## 2017-02-13 LAB — LIPID PANEL
Cholesterol: 99 mg/dL (ref 0–200)
HDL: 35.5 mg/dL — ABNORMAL LOW (ref >39.00)
LDL CALC: 47 mg/dL (ref 0–99)
NonHDL: 63.53
Total CHOL/HDL Ratio: 3
Triglycerides: 84 mg/dL (ref 0.0–149.0)
VLDL: 16.8 mg/dL (ref 0.0–40.0)

## 2017-02-15 ENCOUNTER — Emergency Department (HOSPITAL_COMMUNITY): Payer: Medicare Other

## 2017-02-15 ENCOUNTER — Encounter (HOSPITAL_COMMUNITY): Payer: Self-pay | Admitting: Emergency Medicine

## 2017-02-15 ENCOUNTER — Emergency Department (HOSPITAL_COMMUNITY)
Admission: EM | Admit: 2017-02-15 | Discharge: 2017-02-15 | Disposition: A | Discharge status: Home / Self Care | Payer: Medicare Other | Attending: Emergency Medicine | Admitting: Emergency Medicine

## 2017-02-15 DIAGNOSIS — R51 Headache: Secondary | ICD-10-CM | POA: Diagnosis present

## 2017-02-15 DIAGNOSIS — E1122 Type 2 diabetes mellitus with diabetic chronic kidney disease: Secondary | ICD-10-CM | POA: Insufficient documentation

## 2017-02-15 DIAGNOSIS — Z87891 Personal history of nicotine dependence: Secondary | ICD-10-CM | POA: Diagnosis not present

## 2017-02-15 DIAGNOSIS — R0602 Shortness of breath: Secondary | ICD-10-CM | POA: Insufficient documentation

## 2017-02-15 DIAGNOSIS — J9611 Chronic respiratory failure with hypoxia: Secondary | ICD-10-CM | POA: Insufficient documentation

## 2017-02-15 DIAGNOSIS — N182 Chronic kidney disease, stage 2 (mild): Secondary | ICD-10-CM | POA: Diagnosis not present

## 2017-02-15 DIAGNOSIS — J449 Chronic obstructive pulmonary disease, unspecified: Secondary | ICD-10-CM | POA: Insufficient documentation

## 2017-02-15 DIAGNOSIS — Z7984 Long term (current) use of oral hypoglycemic drugs: Secondary | ICD-10-CM | POA: Diagnosis not present

## 2017-02-15 DIAGNOSIS — Z7982 Long term (current) use of aspirin: Secondary | ICD-10-CM | POA: Insufficient documentation

## 2017-02-15 DIAGNOSIS — J0110 Acute frontal sinusitis, unspecified: Secondary | ICD-10-CM | POA: Diagnosis not present

## 2017-02-15 DIAGNOSIS — I2510 Atherosclerotic heart disease of native coronary artery without angina pectoris: Secondary | ICD-10-CM | POA: Diagnosis not present

## 2017-02-15 DIAGNOSIS — I129 Hypertensive chronic kidney disease with stage 1 through stage 4 chronic kidney disease, or unspecified chronic kidney disease: Secondary | ICD-10-CM | POA: Insufficient documentation

## 2017-02-15 LAB — CBC WITH DIFFERENTIAL/PLATELET
BASOS ABS: 0 10*3/uL (ref 0.0–0.1)
Basophils Relative: 0 %
EOS ABS: 0.4 10*3/uL (ref 0.0–0.7)
Eosinophils Relative: 5 %
HCT: 49 % (ref 39.0–52.0)
HEMOGLOBIN: 17 g/dL (ref 13.0–17.0)
LYMPHS PCT: 39 %
Lymphs Abs: 3.1 10*3/uL (ref 0.7–4.0)
MCH: 33.6 pg (ref 26.0–34.0)
MCHC: 34.7 g/dL (ref 30.0–36.0)
MCV: 96.8 fL (ref 78.0–100.0)
MONO ABS: 0.9 10*3/uL (ref 0.1–1.0)
Monocytes Relative: 11 %
NEUTROS PCT: 45 %
Neutro Abs: 3.6 10*3/uL (ref 1.7–7.7)
PLATELETS: 138 10*3/uL — AB (ref 150–400)
RBC: 5.06 MIL/uL (ref 4.22–5.81)
RDW: 14.3 % (ref 11.5–15.5)
WBC: 8 10*3/uL (ref 4.0–10.5)

## 2017-02-15 LAB — I-STAT VENOUS BLOOD GAS, ED
Acid-Base Excess: 2 mmol/L (ref 0.0–2.0)
Bicarbonate: 25 mmol/L (ref 20.0–28.0)
O2 Saturation: 95 %
PCO2 VEN: 32.9 mmHg — AB (ref 44.0–60.0)
PH VEN: 7.488 — AB (ref 7.250–7.430)
TCO2: 26 mmol/L (ref 0–100)
pO2, Ven: 69 mmHg — ABNORMAL HIGH (ref 32.0–45.0)

## 2017-02-15 LAB — BRAIN NATRIURETIC PEPTIDE: B NATRIURETIC PEPTIDE 5: 97.3 pg/mL (ref 0.0–100.0)

## 2017-02-15 LAB — COMPREHENSIVE METABOLIC PANEL
ALT: 33 U/L (ref 17–63)
AST: 30 U/L (ref 15–41)
Albumin: 3.4 g/dL — ABNORMAL LOW (ref 3.5–5.0)
Alkaline Phosphatase: 69 U/L (ref 38–126)
Anion gap: 8 (ref 5–15)
BUN: 18 mg/dL (ref 6–20)
CHLORIDE: 107 mmol/L (ref 101–111)
CO2: 22 mmol/L (ref 22–32)
Calcium: 9.1 mg/dL (ref 8.9–10.3)
Creatinine, Ser: 1.46 mg/dL — ABNORMAL HIGH (ref 0.61–1.24)
GFR calc non Af Amer: 44 mL/min — ABNORMAL LOW (ref >60)
GFR, EST AFRICAN AMERICAN: 51 mL/min — AB (ref >60)
Glucose, Bld: 132 mg/dL — ABNORMAL HIGH (ref 65–99)
POTASSIUM: 3.9 mmol/L (ref 3.5–5.1)
Sodium: 137 mmol/L (ref 135–145)
Total Bilirubin: 0.9 mg/dL (ref 0.3–1.2)
Total Protein: 6.7 g/dL (ref 6.5–8.1)

## 2017-02-15 LAB — I-STAT TROPONIN, ED: TROPONIN I, POC: 0 ng/mL (ref 0.00–0.08)

## 2017-02-15 MED ORDER — PREDNISONE 20 MG PO TABS
60.0000 mg | ORAL_TABLET | Freq: Once | ORAL | Status: AC
  Administered 2017-02-15: 60 mg via ORAL
  Filled 2017-02-15: qty 3

## 2017-02-15 MED ORDER — PREDNISONE 20 MG PO TABS: 60.0000 mg | ORAL_TABLET | Freq: Every day | ORAL | 0 refills | Status: AC

## 2017-02-15 MED ORDER — IPRATROPIUM-ALBUTEROL 0.5-2.5 (3) MG/3ML IN SOLN
3.0000 mL | Freq: Once | RESPIRATORY_TRACT | Status: AC
  Administered 2017-02-15: 3 mL via RESPIRATORY_TRACT
  Filled 2017-02-15: qty 3

## 2017-02-15 MED ORDER — ALBUTEROL SULFATE HFA 108 (90 BASE) MCG/ACT IN AERS
4.0000 | INHALATION_SPRAY | Freq: Once | RESPIRATORY_TRACT | Status: AC
  Administered 2017-02-15: 4 via RESPIRATORY_TRACT
  Filled 2017-02-15: qty 6.7

## 2017-02-15 MED ORDER — AMOXICILLIN-POT CLAVULANATE 875-125 MG PO TABS: 1.0000 | ORAL_TABLET | Freq: Two times a day (BID) | ORAL | 0 refills | Status: DC

## 2017-02-15 MED ORDER — AMOXICILLIN-POT CLAVULANATE 875-125 MG PO TABS
1.0000 | ORAL_TABLET | Freq: Once | ORAL | Status: AC
  Administered 2017-02-15: 1 via ORAL
  Filled 2017-02-15: qty 1

## 2017-02-15 NOTE — ED Notes (Signed)
Ambulated patient in hallway with no assistance. Patient's oxygen levels were 95% on 2 liters via nasal cannula. Patients oxygen levels dropped to 92 % and patient states" he is a little short of breath." pulse rate was in the mid to high 90's. Patient is supposed to wear 2 liters however is non compliant

## 2017-02-15 NOTE — ED Triage Notes (Signed)
Pt reports on Tuesday he got out of bed and began feeling shaky and generally bad, used inhaler and then felt better, states he has had pressure in his head since then. Pt a/ox4, resp e/u, nad.

## 2017-02-15 NOTE — ED Notes (Signed)
Patient to CT.

## 2017-02-15 NOTE — ED Provider Notes (Signed)
[facility] DEPT Provider Note   CSN: [id number] Arrival date & time: 02/15/17  1120     History   Chief Complaint Chief Complaint  Patient presents with  . Headache    HPI Jonathan Priebe. is a 81 y.o. male.  HPI  81 year old male with extensive past medical history as below including chronic hypoxia and COPD who presents with mild fatigue and headache. The patient states that for the last week, he has had mild nasal congestion. He has also had slightly increased cough. Several days ago, he noticed that he began to feel significantly more short of breath with exertion. He feels that he becomes lightheaded and has to sit down to rest. He is also noticed a dull, aching, generalized headache. This is atypical for him. He has no associated numbness or weakness. He has not regularly been using his oxygen, which she states he is supposed to use at home. He has had no fevers. Denies any increase in sputum production. Denies any chest pain.  Past Medical History:  Diagnosis Date  . CAD (coronary artery disease)    Two RCA stents remotely / 3rd RCA stent 2006  . Colon polyps    s/p several Cscopes.  Marland Kitchen COPD (chronic obstructive pulmonary disease) (HCC)    on O2, nocturnal  . Diabetes mellitus    dx aprox 2009  . ED (erectile dysfunction)    has a vacumm device  . Ejection fraction   . Hyperlipidemia    dx in 90s  . Hypertension    dx in the 90  . Hypogonadism male   . PVD (peripheral vascular disease) ([place])    s/p stents at LE 2009, Dr Einar Gip  . Shortness of breath    O2 Sat dropped to 82% walking on the treadmill, September, 2012    Patient Active Problem List   Diagnosis Date Noted  . Dyspnea on exertion 01/24/2017  . PCP NOTES >>>>>>>>>>>>>>>>>>> 04/03/2016  . Coronary artery disease involving native coronary artery of native heart without angina pectoris 01/23/2016  . Cramp of both lower extremities 01/23/2016  . Hypogonadism male 12/16/2014  . Chronic kidney  disease, stage II (mild) 02/28/2014  . Pain in lower limb ([place]) 01/08/2014  . Ejection fraction   . Onychomycosis 04/10/2013  . Essential tremor 10/14/2012  . Annual physical exam 12/11/2011  . Vitamin D deficiency 12/11/2011  . Back pain, chronic 06/18/2011  . COPD mixed type ([place])   . CAD (coronary artery disease)   . PVD (peripheral vascular disease) ([place])   . HEADACHE 10/06/2010  . Hypogonadism in male 06/21/2010  . DMII (diabetes mellitus, type 2) ([place]) 03/17/2010  . Hyperlipidemia 03/17/2010  . Essential hypertension 03/17/2010    Past Surgical History:  Procedure Laterality Date  . APPENDECTOMY    . TONSILLECTOMY         Home Medications    Prior to Admission medications   Medication Sig Start Date End Date Taking? Authorizing Provider  amLODipine (NORVASC) 10 MG tablet TAKE 1 TABLET DAILY 02/28/15  Yes Carlena Bjornstad, MD  aspirin 81 MG tablet Take 81 mg by mouth daily.     Yes [provider]  clopidogrel (PLAVIX) 75 MG tablet Take 1 tablet (75 mg total) by mouth daily. 11/12/16  Yes Paz, Alda Berthold, MD  Dulaglutide (TRULICITY) 1.5 NW/2.9FA SOPN Inject weekly Patient taking differently: 1.5 mg by Subdermal route every Saturday. Inject weekly 10/11/16  Yes Elayne Snare, MD  glimepiride (AMARYL) 4 MG tablet  Take 4 mg by mouth daily with breakfast.   Yes [provider]  losartan (COZAAR) 100 MG tablet Take 1 tablet (100 mg total) by mouth daily. 10/22/16  Yes Paz, Alda Berthold, MD  metFORMIN (GLUCOPHAGE) 1000 MG tablet Take 1,000 mg by mouth daily with breakfast.   Yes [provider]  metoprolol succinate (TOPROL XL) 100 MG 24 hr tablet Take 1 tablet (100 mg total) by mouth daily. 11/12/16  Yes Paz, Alda Berthold, MD  Multiple Vitamin (MULTIVITAMIN WITH MINERALS) TABS tablet Take 1 tablet by mouth daily.   Yes [provider]  simvastatin (ZOCOR) 20 MG tablet Take 1 tablet (20 mg total) by mouth at bedtime. Patient taking differently: Take 20 mg by  mouth daily.  08/26/15  Yes Colon Branch, MD  SPIRIVA HANDIHALER 18 MCG inhalation capsule INHALE THE CONTENTS OF 1 CAPSULE DAILY 11/07/15  Yes Baird Lyons D, MD  Testosterone 20.25 MG/ACT (1.62%) GEL Place 4 application onto the skin daily. 12/10/16  Yes Elayne Snare, MD  triamterene-hydrochlorothiazide (MAXZIDE-25) 37.5-25 MG tablet Take 1 tablet by mouth daily. 11/12/16  Yes Paz, Alda Berthold, MD  umeclidinium-vilanterol [facility] ELLIPTA) 62.5-25 MCG/INH AEPB Inhale 1 puff into the lungs daily. 01/31/17  Yes Baird Lyons D, MD  amoxicillin-clavulanate (AUGMENTIN) 875-125 MG tablet Take 1 tablet by mouth every 12 (twelve) hours. 02/15/17 02/25/17  Duffy Bruce, MD  budesonide-formoterol (SYMBICORT) 160-4.5 MCG/ACT inhaler Inhale 2 puffs into the lungs 2 (two) times daily. Patient not taking: Reported on 02/06/2017 01/24/17   Deneise Lever, MD  glimepiride (AMARYL) 4 MG tablet TAKE ONE AND ONE-HALF TABLETS DAILY Patient not taking: Reported on 02/15/2017 10/22/16   Elayne Snare, MD  metFORMIN (GLUCOPHAGE) 1000 MG tablet TAKE 1 TABLET TWICE A DAY WITH MEALS Patient not taking: Reported on 02/15/2017 11/18/15   Elayne Snare, MD  predniSONE (DELTASONE) 20 MG tablet Take 3 tablets (60 mg total) by mouth daily. 02/15/17 02/20/17  Duffy Bruce, MD    Family History Family History  Problem Relation Age of Onset  . Heart disease Father   . Hypertension Father   . Stroke Father   . Diabetes Paternal Aunt   . Diabetes Maternal Grandmother   . Diabetes Other        GM, nephews, many family members  . Hyperlipidemia Other        ?  . Colon cancer Neg Hx   . Prostate cancer Neg Hx     Social History Social History  Substance Use Topics  . Smoking status: Former Smoker    Packs/day: 2.00    Years: 30.00    Types: Cigarettes    Quit date: 06/17/1977  . Smokeless tobacco: Never Used     Comment: 2 ppd, quit 1987  . Alcohol use 0.0 oz/week     Comment: socially      Allergies   Patient has no known  allergies.   Review of Systems Review of Systems  Constitutional: Positive for fatigue. Negative for chills and fever.  HENT: Negative for congestion and rhinorrhea.   Eyes: Negative for visual disturbance.  Respiratory: Positive for shortness of breath. Negative for cough and wheezing.   Cardiovascular: Negative for chest pain and leg swelling.  Gastrointestinal: Negative for abdominal pain, diarrhea, nausea and vomiting.  Genitourinary: Negative for dysuria and flank pain.  Musculoskeletal: Negative for neck pain and neck stiffness.  Skin: Negative for rash and wound.  Allergic/Immunologic: Negative for immunocompromised state.  Neurological: Positive for headaches. Negative for  syncope and weakness.  All other systems reviewed and are negative.    Physical Exam Updated Vital Signs BP 131/80   Pulse 78   Temp 97.4 F (36.3 C) (Oral)   Resp 17   Ht 6\' 2"  (1.88 m)   Wt 116.6 kg (257 lb)   SpO2 100%   BMI 33.00 kg/m   Physical Exam  Constitutional: He is oriented to person, place, and time. He appears well-developed and well-nourished. No distress.  HENT:  Head: Normocephalic and atraumatic.  Moderate nasal congestion with purulent discharge bilaterally. Mild posterior pharyngeal erythema without tonsillar swelling or exudates.  Eyes: Conjunctivae are normal.  Neck: Neck supple.  Cardiovascular: Normal rate, regular rhythm and normal heart sounds.  Exam reveals no friction rub.   No murmur heard. Pulmonary/Chest: Effort normal and breath sounds normal. No respiratory distress. He has no wheezes. He has no rales.  Mild end expiratory wheezes  Abdominal: He exhibits no distension.  Musculoskeletal: He exhibits no edema.  Neurological: He is alert and oriented to person, place, and time. He has normal strength. No cranial nerve deficit or sensory deficit. He exhibits normal muscle tone. GCS eye subscore is 4. GCS verbal subscore is 5. GCS motor subscore is 6.  Skin: Skin is  warm. Capillary refill takes less than 2 seconds.  Psychiatric: He has a normal mood and affect.  Nursing note and vitals reviewed.    ED Treatments / Results  Labs (all labs ordered are listed, but only abnormal results are displayed) Labs Reviewed  CBC WITH DIFFERENTIAL/PLATELET - Abnormal; Notable for the following:       Result Value   Platelets 138 (*)    All other components within normal limits  COMPREHENSIVE METABOLIC PANEL - Abnormal; Notable for the following:    Glucose, Bld 132 (*)    Creatinine, Ser 1.46 (*)    Albumin 3.4 (*)    GFR calc non Af Amer 44 (*)    GFR calc Af Amer 51 (*)    All other components within normal limits  I-STAT VENOUS BLOOD GAS, ED - Abnormal; Notable for the following:    pH, Ven 7.488 (*)    pCO2, Ven 32.9 (*)    pO2, Ven 69.0 (*)    All other components within normal limits  BRAIN NATRIURETIC PEPTIDE  I-STAT TROPOININ, ED    EKG  EKG Interpretation  Date/Time:  Friday February 15 2017 12:19:43 EDT Ventricular Rate:  88 PR Interval:    QRS Duration: 80 QT Interval:  379 QTC Calculation: 459 R Axis:   52 Text Interpretation:  Sinus rhythm Prolonged PR interval Low voltage, extremity and precordial leads No significant change since last tracing Confirmed by Duffy Bruce [phone]) on 02/15/2017 2:12:11 PM       Radiology Dg Chest 2 View  Result Date: 02/15/2017 CLINICAL DATA:  Headache and weakness EXAM: CHEST  2 VIEW COMPARISON:  12/12/2015 FINDINGS: Borderline cardiomegaly. Stable aortic tortuosity. There is no edema, consolidation, effusion, or pneumothorax. No acute osseous finding. IMPRESSION: Stable from prior.  No evidence of active disease. Electronically Signed   By: Monte Fantasia M.D.   On: 02/15/2017 13:10   Ct Head Wo Contrast  Result Date: 02/15/2017 CLINICAL DATA:  Headache and tremors EXAM: CT HEAD WITHOUT CONTRAST TECHNIQUE: Contiguous axial images were obtained from the base of the skull through the vertex without  intravenous contrast. COMPARISON:  None. FINDINGS: Brain: There is age related volume loss. There is no intracranial mass, hemorrhage,  extra-axial fluid collection, or midline shift. There is mild small vessel disease in the centra semiovale bilaterally. Elsewhere gray-white compartments appear normal. No evident acute infarct. Vascular: No hyperdense vessels. There is calcification in each carotid siphon. There is also mild calcification in the distal left vertebral artery. Skull: Bony calvarium appears intact. Sinuses/Orbits: There is opacification of multiple ethmoid air cells bilaterally. There is slight mucosal thickening in the inferior right frontal sinus as well as in each anterior sphenoid sinus. Other visualized paranasal sinuses are clear. Orbits appear symmetric bilaterally. Other: Visualized mastoid air cells clear. IMPRESSION: Age related volume loss with slight periventricular small vessel disease. No intracranial mass, hemorrhage, or extra-axial fluid collection. No acute infarct. Areas of paranasal sinus disease noted. Areas of vascular calcification noted. Electronically Signed   By: Lowella Grip III M.D.   On: 02/15/2017 13:30    Procedures Procedures (including critical care time)  Medications Ordered in ED Medications  ipratropium-albuterol (DUONEB) 0.5-2.5 (3) MG/3ML nebulizer solution 3 mL (3 mLs Nebulization Given 02/15/17 1219)  predniSONE (DELTASONE) tablet 60 mg (60 mg Oral Given 02/15/17 1457)  amoxicillin-clavulanate (AUGMENTIN) 875-125 MG per tablet 1 tablet (1 tablet Oral Given 02/15/17 1458)  albuterol (PROVENTIL HFA;VENTOLIN HFA) 108 (90 Base) MCG/ACT inhaler 4 puff (4 puffs Inhalation Given 02/15/17 1458)     Initial Impression / Assessment and Plan / ED Course  I have reviewed the triage vital signs and the nursing notes.  Pertinent labs & imaging results that were available during my care of the patient were reviewed by me and considered in my medical decision  making (see chart for details).    81 year old male here with mild exertional dyspnea and headaches. I suspect this is secondary to acute on chronic hypoxia secondary to mild URI with sinusitis. His chest x-ray is clear. He has no chest pain, EKG is nonischemic, and his symptoms are all attributable to hypoxia and I do not suspect ACS. His troponin is negative despite constant symptoms. Chest x-ray is without pneumonia. He has oxygen arranged at home and feels markedly improved following oxygen supplementation and breathing treatment in the ER. His headache is consistent with mild hypoxia versus tension type headache. He has no focal numbness, weakness, or evidence to suggest CVA or TIA. No focal neurological deficits. He has no fever, neck stiffness, or signs of meningitis or encephalitis. His headache is worse when he naps her in the morning, which correlates with his nocturnal hypoxia. He would like to return home which I feel is reasonable as he has oxygen at home and is supposed to be wearing this according to review of records. I have consulted case management as well who provided the patient with a long oxygen nasal cannula as well as concentrator. Will start the patient on prednisone as well as Augmentin for sinusitis and possible mild URI with COPD. Advised him that he needs to use his home O2 24/7 for at least the next week. H eis ambulatory in ED without difficulty on O2 - feels improved. Patient will follow-up with his PCP next week.   Final Clinical Impressions(s) / ED Diagnoses   Final diagnoses:  Chronic respiratory failure with hypoxia ([place])  Acute non-recurrent frontal sinusitis    New Prescriptions Discharge Medication List as of 02/15/2017  2:17 PM    START taking these medications   Details  amoxicillin-clavulanate (AUGMENTIN) 875-125 MG tablet Take 1 tablet by mouth every 12 (twelve) hours., Starting Fri 02/15/2017, Until Mon 02/25/2017, Print    predniSONE (  DELTASONE) 20 MG  tablet Take 3 tablets (60 mg total) by mouth daily., Starting Fri 02/15/2017, Until Wed 02/20/2017, Print         Duffy Bruce, MD 02/15/17 [phone]

## 2017-02-15 NOTE — Discharge Planning (Signed)
[facility] consulted regarding extended O2 tubing.  EDCM contacted Respiratory to obtain connector and extension tubing.

## 2017-02-15 NOTE — ED Notes (Signed)
Patient placed on 2L O2 

## 2017-02-15 NOTE — Discharge Planning (Signed)
Oxygen tubing, extension and connectors delivered to pt room.  No further CM needs identified at this time.

## 2017-02-15 NOTE — Discharge Instructions (Signed)
WEAR YOUR OXYGEN AT ALL TIMES AT HOME FOR AT [place] DR. YOUNG TO ARRANGE FOLLOW-UP IN THE NEXT 1-2 WEEKS  TAKE THE ANTIBIOTIC AND STEROIDS AS PRESCRIBED  USE YOUR HOME INHALERS REGULARLY

## 2017-02-15 NOTE — ED Notes (Signed)
Pt c/o pain at face and outside of head.ED provider at bedside

## 2017-02-18 ENCOUNTER — Telehealth: Payer: Self-pay | Admitting: Behavioral Health

## 2017-02-18 ENCOUNTER — Telehealth: Payer: Self-pay | Admitting: Internal Medicine

## 2017-02-18 NOTE — Telephone Encounter (Signed)
Per Mrs. Birchard, her husband is not at home at this time, but she will have him call the office once he returns.

## 2017-02-18 NOTE — Telephone Encounter (Signed)
Joellen Jersey- I last saw this man about a year ago. Please see when we can work him in.

## 2017-02-18 NOTE — Telephone Encounter (Signed)
thx

## 2017-02-18 NOTE — Telephone Encounter (Signed)
Patient called back - he can be reached at [phone] -pr

## 2017-02-18 NOTE — Telephone Encounter (Addendum)
Patient returning call best # [phone]

## 2017-02-18 NOTE — Telephone Encounter (Signed)
Per the patient, he's doing fine post ED visit over the weekend. Patient voices no complaints at this time. Hospital follow-up appointment scheduled for 02/20/17 at 11:30 AM with PCP.

## 2017-02-18 NOTE — Telephone Encounter (Signed)
LM x 1 for pt 

## 2017-02-18 NOTE — Telephone Encounter (Signed)
Pt recently seen in ED for chronic respiratory failure, was advised to follow up with pulmonary within a week.  Pt requesting to see CY only, no openings available to schedule patient.    CY please advise if pt can be worked in to your schedule somewhere.  Thanks!

## 2017-02-19 ENCOUNTER — Other Ambulatory Visit: Payer: Self-pay

## 2017-02-19 NOTE — Telephone Encounter (Signed)
Pt can be seen in 230 slot on 02-28-17. Thanks.

## 2017-02-19 NOTE — Telephone Encounter (Signed)
Pt scheduled on 02/28/17 at 2:30p Nothing further needed.

## 2017-02-20 ENCOUNTER — Encounter: Payer: Self-pay | Admitting: Internal Medicine

## 2017-02-20 ENCOUNTER — Ambulatory Visit: Payer: Medicare Other | Admitting: Internal Medicine

## 2017-02-20 ENCOUNTER — Ambulatory Visit (INDEPENDENT_AMBULATORY_CARE_PROVIDER_SITE_OTHER): Payer: Medicare Other | Admitting: Internal Medicine

## 2017-02-20 VITALS — BP 122/64 | HR 84 | Temp 98.2°F | Resp 14 | Ht 73.0 in | Wt 247.5 lb

## 2017-02-20 DIAGNOSIS — R7989 Other specified abnormal findings of blood chemistry: Secondary | ICD-10-CM | POA: Diagnosis not present

## 2017-02-20 DIAGNOSIS — B009 Herpesviral infection, unspecified: Secondary | ICD-10-CM | POA: Diagnosis not present

## 2017-02-20 DIAGNOSIS — R0902 Hypoxemia: Secondary | ICD-10-CM | POA: Diagnosis not present

## 2017-02-20 DIAGNOSIS — I2510 Atherosclerotic heart disease of native coronary artery without angina pectoris: Secondary | ICD-10-CM | POA: Diagnosis not present

## 2017-02-20 MED ORDER — VALACYCLOVIR HCL 500 MG PO TABS: 500.0000 mg | ORAL_TABLET | Freq: Two times a day (BID) | ORAL | 0 refills | Status: DC

## 2017-02-20 MED ORDER — ACYCLOVIR 5 % EX OINT: 1.0000 "application " | TOPICAL_OINTMENT | CUTANEOUS | 0 refills | Status: DC

## 2017-02-20 NOTE — Progress Notes (Signed)
Pre visit review using our clinic review tool, if applicable. No additional management support is needed unless otherwise documented below in the visit note. 

## 2017-02-20 NOTE — Patient Instructions (Signed)
Next visit in 3 months, no need to be fasting, please make an appointment  For the rash: Take Valtrex as prescribed  Use the ointment for 5 days  Call if the rash is not better or get worse

## 2017-02-20 NOTE — Progress Notes (Signed)
Subjective:    Patient ID: Jonathan [name]., male    DOB: [birth year], 81 y.o.   MRN: [id number]  DOS:  02/20/2017 Type of visit - description : ER follow-up Interval history: Martin Majestic to the ER 02/15/2017, seen with fatigue , headache, increase DOE, lightheadedness Has not been using his oxygen regularly sx suspected to be secondary to acute on chronic hypoxia secondary to a URI or mild sinusitis. EKG without acute changes, troponin negative. He was markedly better when he was placed back on oxygen and had a breathing treatment at the ER Was prescribed prednisone and Augmentin for presumed sinusitis or COPD exacerbation.  Today he also he reports that 2 days prior to going to the ER developed a severe fever blister episodes at the upper lip, right side and at the nose bilaterally. Rash is now getting better and drying up.     Wt Readings from Last 3 Encounters:  02/20/17 247 lb 8 oz (112.3 kg)  02/15/17 257 lb (116.6 kg)  02/06/17 250 lb 2 oz (113.5 kg)    Review of Systems  At this point, he finished prednisone and Augmentin. He feels great, back to normal. No fever chills No cough Good compliance with oxygen when he is at home, no chest pain or lower extremity edema.   Past Medical History:  Diagnosis Date  . CAD (coronary artery disease)    Two RCA stents remotely / 3rd RCA stent 2006  . Colon polyps    s/p several Cscopes.  Marland Kitchen COPD (chronic obstructive pulmonary disease) (HCC)    on O2, nocturnal  . Diabetes mellitus    dx aprox 2009  . ED (erectile dysfunction)    has a vacumm device  . Ejection fraction   . Hyperlipidemia    dx in 90s  . Hypertension    dx in the 90  . Hypogonadism male   . PVD (peripheral vascular disease) ([place])    s/p stents at LE 2009, Dr Einar Gip  . Shortness of breath    O2 Sat dropped to 82% walking on the treadmill, September, 2012    Past Surgical History:  Procedure Laterality Date  . APPENDECTOMY    . TONSILLECTOMY      Social  History   Social History  . Marital status: Married    Spouse name: Cerrella   . Number of children: 6  . Years of education: N/A   Occupational History  . 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 Smoking status: Former Smoker    Packs/day: 2.00    Years: 30.00    Types: Cigarettes    Quit date: 06/17/1977  . Smokeless tobacco: Never Used     Comment: 2 ppd, quit 1987  . Alcohol use 0.0 oz/week     Comment: socially   . Drug use: No  . Sexual activity: Not on file   Other Topics Concern  . Not on file   Social History Narrative   Has 2 step sons, and 4 children (lost 1 son)                      Allergies as of 02/20/2017   No Known Allergies     Medication List       Accurate as of 02/20/17 11:59 PM. Always use your most recent med list.          acyclovir ointment 5 %  Commonly known as:  ZOVIRAX Apply 1 application topically every 3 (three) hours. Use for 5 days   amLODipine 10 MG tablet Commonly known as:  NORVASC TAKE 1 TABLET DAILY   aspirin 81 MG tablet Take 81 mg by mouth daily.   clopidogrel 75 MG tablet Commonly known as:  PLAVIX Take 1 tablet (75 mg total) by mouth daily.   Dulaglutide 1.5 MG/0.5ML Sopn Commonly known as:  TRULICITY Inject weekly   glimepiride 4 MG tablet Commonly known as:  AMARYL Take 1 tablet (4 mg total) by mouth daily with breakfast.   losartan 100 MG tablet Commonly known as:  COZAAR Take 1 tablet (100 mg total) by mouth daily.   metFORMIN 1000 MG tablet Commonly known as:  GLUCOPHAGE TAKE 1 TABLET TWICE A DAY WITH MEALS   metoprolol succinate 100 MG 24 hr tablet Commonly known as:  TOPROL XL Take 1 tablet (100 mg total) by mouth daily.   multivitamin with minerals Tabs tablet Take 1 tablet by mouth daily.   predniSONE 20 MG tablet Commonly known as:  DELTASONE Take 3 tablets (60 mg total) by mouth daily.   simvastatin 20 MG tablet Commonly known as:  ZOCOR Take 1  tablet (20 mg total) by mouth at bedtime.   SPIRIVA HANDIHALER 18 MCG inhalation capsule Generic drug:  tiotropium INHALE THE CONTENTS OF 1 CAPSULE DAILY   Testosterone 20.25 MG/ACT (1.62%) Gel Place 4 application onto the skin daily.   triamterene-hydrochlorothiazide 37.5-25 MG tablet Commonly known as:  MAXZIDE-25 Take 1 tablet by mouth daily.   umeclidinium-vilanterol 62.5-25 MCG/INH Aepb Commonly known as:  ANORO ELLIPTA Inhale 1 puff into the lungs daily.   valACYclovir 500 MG tablet Commonly known as:  VALTREX Take 1 tablet (500 mg total) by mouth 2 (two) times daily.          Objective:   Physical Exam BP 122/64 (BP Location: Left Arm, Patient Position: Sitting, Cuff Size: Normal)   Pulse 84   Temp 98.2 F (36.8 C) (Oral)   Resp 14   Ht 6\' 1"  (1.854 m)   Wt 247 lb 8 oz (112.3 kg)   SpO2 94%   BMI 32.65 kg/m  General:   Well developed, well nourished . NAD.  HEENT:  Normocephalic . Face symmetric, atraumatic Several dry blisters at the upper lip, mostly on the right side, also dry blisters at the nasal cavity, worse on the right. Lungs:  CTA B Normal respiratory effort, no intercostal retractions, no accessory muscle use. Heart: RRR,  no murmur.  No pretibial edema bilaterally  Skin: Not pale. Not jaundice Neurologic:  alert & oriented X3.  Speech normal, gait appropriate for age and unassisted Psych--  Cognition and judgment appear intact.  Cooperative with normal attention span and concentration.  Behavior appropriate. No anxious or depressed appearing.      Assessment & Plan:    Assessment DM dx ~3149, complicated by CAD, PVD, mild neuropathy. Sees Dr. Dwyane Dee Hypogonadism. Sees Dr. Dwyane Dee HTN Hyperlipidemia COPD, nocturnal O2 prn Back pain, chronic: See visit 10/12/2016 CV: -CAD, Dr. Ron Parker Dr Meda Coffee -Peripheral vascular disease, stents lower extremity 2009  ED  PLAN   Hypoxia:  Went to the ER, w/u neg, dx w/  possible sinusitis, COPD  exacerbation. On abx and prednisone and feeling great, back to  normal. Abnormal CBC: On 02/13/2017, I requested a CBC, it came back abnormal. On retrospect, by that time he was developing a HSV infection, related?Marland Kitchen The CBC was rechecked and  was essentially normal except for abnormal lymphocytes. He is not having any fever chills . Will recheck a CBC when he comes back in 3-4 months. HSV infection: Already getting better, to be sure will prescribe Valtrex and Zovirax. See instructions. RTC 3 months.   HTN: Well-controlled on losartan, Toprol, Maxzide. Last BMP satisfactory Hyperlipidemia: Continue with simvastatin, come back fasting within few days for FLP. Last LFTs very good Polycythemia: Undergoing a workup by pulmonology, they are doing overnight oximetry. Check a CBC DOE: Saw pulmonology with DOE, they are working on his inhalers. RTC 6 months

## 2017-02-21 NOTE — Assessment & Plan Note (Signed)
Hypoxia:  Went to the ER, w/u neg, dx w/  possible sinusitis, COPD exacerbation. On abx and prednisone and feeling great, back to  normal. Abnormal CBC: On 02/13/2017, I requested a CBC, it came back abnormal. On retrospect, by that time he was developing a HSV infection, related?Marland Kitchen The CBC was rechecked and was essentially normal except for abnormal lymphocytes. He is not having any fever chills . Will recheck a CBC when he comes back in 3-4 months. HSV infection: Already getting better, to be sure will prescribe Valtrex and Zovirax. See instructions. RTC 3 months.

## 2017-02-26 ENCOUNTER — Encounter (HOSPITAL_COMMUNITY): Payer: Self-pay | Admitting: Emergency Medicine

## 2017-02-26 ENCOUNTER — Telehealth: Payer: Self-pay | Admitting: Internal Medicine

## 2017-02-26 ENCOUNTER — Ambulatory Visit: Payer: Medicare Other | Admitting: Family Medicine

## 2017-02-26 ENCOUNTER — Emergency Department (HOSPITAL_COMMUNITY)
Admission: EM | Admit: 2017-02-26 | Discharge: 2017-02-26 | Disposition: A | Discharge status: Home / Self Care | Payer: Medicare Other | Attending: Emergency Medicine | Admitting: Emergency Medicine

## 2017-02-26 DIAGNOSIS — Z87891 Personal history of nicotine dependence: Secondary | ICD-10-CM | POA: Diagnosis not present

## 2017-02-26 DIAGNOSIS — E1122 Type 2 diabetes mellitus with diabetic chronic kidney disease: Secondary | ICD-10-CM | POA: Diagnosis not present

## 2017-02-26 DIAGNOSIS — I2510 Atherosclerotic heart disease of native coronary artery without angina pectoris: Secondary | ICD-10-CM | POA: Diagnosis not present

## 2017-02-26 DIAGNOSIS — I129 Hypertensive chronic kidney disease with stage 1 through stage 4 chronic kidney disease, or unspecified chronic kidney disease: Secondary | ICD-10-CM | POA: Insufficient documentation

## 2017-02-26 DIAGNOSIS — K920 Hematemesis: Secondary | ICD-10-CM | POA: Diagnosis not present

## 2017-02-26 DIAGNOSIS — N182 Chronic kidney disease, stage 2 (mild): Secondary | ICD-10-CM | POA: Diagnosis not present

## 2017-02-26 DIAGNOSIS — K922 Gastrointestinal hemorrhage, unspecified: Secondary | ICD-10-CM | POA: Diagnosis not present

## 2017-02-26 DIAGNOSIS — Z79899 Other long term (current) drug therapy: Secondary | ICD-10-CM | POA: Insufficient documentation

## 2017-02-26 DIAGNOSIS — Z7982 Long term (current) use of aspirin: Secondary | ICD-10-CM | POA: Diagnosis not present

## 2017-02-26 DIAGNOSIS — J449 Chronic obstructive pulmonary disease, unspecified: Secondary | ICD-10-CM | POA: Insufficient documentation

## 2017-02-26 DIAGNOSIS — Z7984 Long term (current) use of oral hypoglycemic drugs: Secondary | ICD-10-CM | POA: Diagnosis not present

## 2017-02-26 LAB — CBC
HEMATOCRIT: 46.1 % (ref 39.0–52.0)
Hemoglobin: 15.9 g/dL (ref 13.0–17.0)
MCH: 33.5 pg (ref 26.0–34.0)
MCHC: 34.5 g/dL (ref 30.0–36.0)
MCV: 97.1 fL (ref 78.0–100.0)
PLATELETS: 227 10*3/uL (ref 150–400)
RBC: 4.75 MIL/uL (ref 4.22–5.81)
RDW: 14.2 % (ref 11.5–15.5)
WBC: 10.2 10*3/uL (ref 4.0–10.5)

## 2017-02-26 LAB — COMPREHENSIVE METABOLIC PANEL
ALT: 32 U/L (ref 17–63)
AST: 25 U/L (ref 15–41)
Albumin: 3.3 g/dL — ABNORMAL LOW (ref 3.5–5.0)
Alkaline Phosphatase: 55 U/L (ref 38–126)
Anion gap: 8 (ref 5–15)
BILIRUBIN TOTAL: 0.7 mg/dL (ref 0.3–1.2)
BUN: 23 mg/dL — AB (ref 6–20)
CO2: 22 mmol/L (ref 22–32)
CREATININE: 1.25 mg/dL — AB (ref 0.61–1.24)
Calcium: 8.6 mg/dL — ABNORMAL LOW (ref 8.9–10.3)
Chloride: 107 mmol/L (ref 101–111)
GFR calc Af Amer: >60 mL/min (ref >60)
GFR, EST NON AFRICAN AMERICAN: 53 mL/min — AB (ref >60)
Glucose, Bld: 228 mg/dL — ABNORMAL HIGH (ref 65–99)
POTASSIUM: 4 mmol/L (ref 3.5–5.1)
Sodium: 137 mmol/L (ref 135–145)
TOTAL PROTEIN: 6.2 g/dL — AB (ref 6.5–8.1)

## 2017-02-26 LAB — TYPE AND SCREEN
ABO/RH(D): O POS
ANTIBODY SCREEN: NEGATIVE

## 2017-02-26 LAB — ABO/RH: ABO/RH(D): O POS

## 2017-02-26 LAB — POC OCCULT BLOOD, ED: Fecal Occult Bld: POSITIVE — AB

## 2017-02-26 NOTE — Discharge Instructions (Signed)
Take a stool softener Follow up with Dr. Larose Kells to have hemoglobin rechecked Return for large amount of bleeding, severe pain, lightheadedness or passing out

## 2017-02-26 NOTE — ED Triage Notes (Signed)
Pt states had bleeding in stool today-- "alot" had cramping prior to BM, pt is on plavix.

## 2017-02-26 NOTE — Telephone Encounter (Signed)
Pt called made appt today with dr blyth due to [facility] schedule full. Pt wife called and canceled appt stating she took pt to ED they just arrived there and to cancel today appt.

## 2017-02-26 NOTE — ED Provider Notes (Signed)
[facility] DEPT Provider Note   CSN: [id number] Arrival date & time: 02/26/17  1115     History   Chief Complaint Chief Complaint  Patient presents with  . GI Bleeding    HPI Jonathan [name]. is a 81 y.o. male who presents with hematochezia. PMH significant for CAD on plavix, COPD, DM, PVD, hx of colon polyps. He states that this morning he noticed bright red blood in his stool after having a BM. He got worried so came to the ED to "get it checked out". He denies any other symptoms. He denies any hx of blood in the stool. He denies fever, chills, chest pain, SOB, abdominal pain, N/V/D, constipation, lightheadedness. He hast had a colonoscopy in 2016 which was remarkable for polyps, small internal hemorrhoids, diverticulosis.   HPI  Past Medical History:  Diagnosis Date  . CAD (coronary artery disease)    Two RCA stents remotely / 3rd RCA stent 2006  . Colon polyps    s/p several Cscopes.  Marland Kitchen COPD (chronic obstructive pulmonary disease) (HCC)    on O2, nocturnal  . Diabetes mellitus    dx aprox 2009  . ED (erectile dysfunction)    has a vacumm device  . Ejection fraction   . Hyperlipidemia    dx in 90s  . Hypertension    dx in the 90  . Hypogonadism male   . PVD (peripheral vascular disease) ([place])    s/p stents at LE 2009, Dr Einar Gip  . Shortness of breath    O2 Sat dropped to 82% walking on the treadmill, September, 2012    Patient Active Problem List   Diagnosis Date Noted  . Dyspnea on exertion 01/24/2017  . PCP NOTES >>>>>>>>>>>>>>>>>>> 04/03/2016  . Coronary artery disease involving native coronary artery of native heart without angina pectoris 01/23/2016  . Cramp of both lower extremities 01/23/2016  . Hypogonadism male 12/16/2014  . Chronic kidney disease, stage II (mild) 02/28/2014  . Pain in lower limb ([place]) 01/08/2014  . Ejection fraction   . Onychomycosis 04/10/2013  . Essential tremor 10/14/2012  . Annual physical exam 12/11/2011  . Vitamin D  deficiency 12/11/2011  . Back pain, chronic 06/18/2011  . COPD mixed type ([place])   . CAD (coronary artery disease)   . PVD (peripheral vascular disease) ([place])   . HEADACHE 10/06/2010  . Hypogonadism in male 06/21/2010  . DMII (diabetes mellitus, type 2) ([place]) 03/17/2010  . Hyperlipidemia 03/17/2010  . Essential hypertension 03/17/2010    Past Surgical History:  Procedure Laterality Date  . APPENDECTOMY    . TONSILLECTOMY      Home Medications    Prior to Admission medications   Medication Sig Start Date End Date Taking? Authorizing Provider  acyclovir ointment (ZOVIRAX) 5 % Apply 1 application topically every 3 (three) hours. Use for 5 days 02/20/17   Colon Branch, MD  amLODipine (NORVASC) 10 MG tablet TAKE 1 TABLET DAILY 02/28/15   Carlena Bjornstad, MD  aspirin 81 MG tablet Take 81 mg by mouth daily.      [provider]  clopidogrel (PLAVIX) 75 MG tablet Take 1 tablet (75 mg total) by mouth daily. 11/12/16   Colon Branch, MD  Dulaglutide (TRULICITY) 1.5 JI/9.6VE SOPN Inject weekly Patient taking differently: 1.5 mg by Subdermal route every Saturday. Inject weekly 10/11/16   Elayne Snare, MD  glimepiride (AMARYL) 4 MG tablet Take 1 tablet (4 mg total) by mouth daily with breakfast. 02/19/17  Colon Branch, MD  losartan (COZAAR) 100 MG tablet Take 1 tablet (100 mg total) by mouth daily. 10/22/16   Colon Branch, MD  metFORMIN (GLUCOPHAGE) 1000 MG tablet TAKE 1 TABLET TWICE A DAY WITH MEALS 11/18/15   Elayne Snare, MD  metoprolol succinate (TOPROL XL) 100 MG 24 hr tablet Take 1 tablet (100 mg total) by mouth daily. 11/12/16   Colon Branch, MD  Multiple Vitamin (MULTIVITAMIN WITH MINERALS) TABS tablet Take 1 tablet by mouth daily.    [provider]  simvastatin (ZOCOR) 20 MG tablet Take 1 tablet (20 mg total) by mouth at bedtime. Patient taking differently: Take 20 mg by mouth daily.  08/26/15   Colon Branch, MD  SPIRIVA HANDIHALER 18 MCG inhalation capsule INHALE THE CONTENTS OF 1  CAPSULE DAILY 11/07/15   Baird Lyons D, MD  Testosterone 20.25 MG/ACT (1.62%) GEL Place 4 application onto the skin daily. 12/10/16   Elayne Snare, MD  triamterene-hydrochlorothiazide (MAXZIDE-25) 37.5-25 MG tablet Take 1 tablet by mouth daily. 11/12/16   Colon Branch, MD  umeclidinium-vilanterol [facility] ELLIPTA) 62.5-25 MCG/INH AEPB Inhale 1 puff into the lungs daily. 01/31/17   Deneise Lever, MD  valACYclovir (VALTREX) 500 MG tablet Take 1 tablet (500 mg total) by mouth 2 (two) times daily. 02/20/17   Colon Branch, MD    Family History Family History  Problem Relation Age of Onset  . Heart disease Father   . Hypertension Father   . Stroke Father   . Diabetes Paternal Aunt   . Diabetes Maternal Grandmother   . Diabetes Other        GM, nephews, many family members  . Hyperlipidemia Other        ?  . Colon cancer Neg Hx   . Prostate cancer Neg Hx     Social History Social History  Substance Use Topics  . Smoking status: Former Smoker    Packs/day: 2.00    Years: 30.00    Types: Cigarettes    Quit date: 06/17/1977  . Smokeless tobacco: Never Used     Comment: 2 ppd, quit 1987  . Alcohol use 0.0 oz/week     Comment: socially      Allergies   Patient has no known allergies.   Review of Systems Review of Systems  Constitutional: Negative for chills and fever.  Respiratory: Negative for shortness of breath.   Cardiovascular: Negative for chest pain.  Gastrointestinal: Positive for blood in stool. Negative for abdominal pain, constipation, diarrhea, nausea, rectal pain and vomiting.  Neurological: Negative for syncope and light-headedness.  All other systems reviewed and are negative.    Physical Exam Updated Vital Signs BP (S) 99/69 (BP Location: Right Arm)   Pulse (S) (!) 114   Temp 97 F (36.1 C) (Oral)   Resp 18   Ht 6\' 2"  (1.88 m)   Wt 112 kg (247 lb)   SpO2 97%   BMI 31.71 kg/m   Physical Exam  Constitutional: He is oriented to person, place, and time. He  appears well-developed and well-nourished. No distress.  HENT:  Head: Normocephalic and atraumatic.  Eyes: Conjunctivae are normal. Pupils are equal, round, and reactive to light. Right eye exhibits no discharge. Left eye exhibits no discharge. No scleral icterus.  Neck: Normal range of motion.  Cardiovascular: Normal rate and regular rhythm.  Exam reveals no gallop and no friction rub.   No murmur heard. Pulmonary/Chest: Effort normal and breath sounds normal. No  respiratory distress. He has no wheezes. He has no rales. He exhibits no tenderness.  Abdominal: Soft. Bowel sounds are normal. He exhibits no distension and no mass. There is no tenderness. There is no rebound and no guarding. No hernia.  Genitourinary:  Genitourinary Comments: Rectal: Small amount of bright red blood with digital exam. No hemorrhoids, fissures, redness, area of fluctuance, lesions, or tenderness. Chaperone present during exam.   Neurological: He is alert and oriented to person, place, and time.  Skin: Skin is warm and dry.  Psychiatric: He has a normal mood and affect. His behavior is normal.  Nursing note and vitals reviewed.    ED Treatments / Results  Labs (all labs ordered are listed, but only abnormal results are displayed) Labs Reviewed  COMPREHENSIVE METABOLIC PANEL - Abnormal; Notable for the following:       Result Value   Glucose, Bld 228 (*)    BUN 23 (*)    Creatinine, Ser 1.25 (*)    Calcium 8.6 (*)    Total Protein 6.2 (*)    Albumin 3.3 (*)    GFR calc non Af Amer 53 (*)    All other components within normal limits  POC OCCULT BLOOD, ED - Abnormal; Notable for the following:    Fecal Occult Bld POSITIVE (*)    All other components within normal limits  CBC  TYPE AND SCREEN  ABO/RH    EKG  EKG Interpretation None       Radiology No results found.  Procedures Procedures (including critical care time)  Medications Ordered in ED Medications - No data to  display   Initial Impression / Assessment and Plan / ED Course  I have reviewed the triage vital signs and the nursing notes.  Pertinent labs & imaging results that were available during my care of the patient were reviewed by me and considered in my medical decision making (see chart for details).  81 year old with rectal bleeding. There were a couple of BP readings that were on the low side but on recheck they are normal. He is also asymptomatic currently. Hgb is 15.9. CMP remarkable for hyperglycemia, elevated Cr which is improved from baseline. Pt has appointment tomorrow with his endocrinologist. Advised him to call his PCP to schedule repeat Hgb. Discussed with Dr. Leonette Monarch who is in agreement with plan. Gave strict return precautions.  Final Clinical Impressions(s) / ED Diagnoses   Final diagnoses:  Lower GI bleed    New Prescriptions New Prescriptions   No medications on file     Iris Pert 02/26/17 [place], MD 02/26/17 [phone]

## 2017-02-27 ENCOUNTER — Encounter: Payer: Self-pay | Admitting: Internal Medicine

## 2017-02-27 ENCOUNTER — Ambulatory Visit (INDEPENDENT_AMBULATORY_CARE_PROVIDER_SITE_OTHER): Payer: Medicare Other | Admitting: Internal Medicine

## 2017-02-27 VITALS — BP 126/72 | HR 104 | Temp 98.0°F | Resp 14 | Ht 73.0 in | Wt 247.5 lb

## 2017-02-27 DIAGNOSIS — K922 Gastrointestinal hemorrhage, unspecified: Secondary | ICD-10-CM | POA: Diagnosis not present

## 2017-02-27 DIAGNOSIS — I2510 Atherosclerotic heart disease of native coronary artery without angina pectoris: Secondary | ICD-10-CM

## 2017-02-27 NOTE — Patient Instructions (Signed)
Come back next week for blood count, please make a lab appointment.  Next visit to see me should be by November.  Stop Plavix, continue aspirin  Call or go to the ER if abdominal pain, ongoing blood in the stools; also if you felt weak, fainty. Or if you have chest pain, difficulty breathing or palpitations.

## 2017-02-27 NOTE — Progress Notes (Signed)
Subjective:    Patient ID: Jonathan [name]., male    DOB: [birth year], 81 y.o.   MRN: [id number]  DOS:  02/27/2017 Type of visit - description :  Interval history: Patient went to the ER yesterday 02/26/2014. That morning, he saw bright red blood in the stools after a BM. Review of system was otherwise negative including no abdominal pain. Abdominal exam was normal, rectal exam show small amount of bright red blood. No hemorrhoids, fissures, fluctuance or lesions. Hemoccult positive, CMP satisfactory except for slightly low protein. Hemoglobin decreased from 17.0 to 15.9. Platelets normal  Review of Systems Since he left the ER yesterday he continue to feel well. Denies pain with bowel movements, no abdominal pain, no nausea or vomiting. Yesterday described the blood as red and abundant, in the toilet water. He is not sure if he was mixed with the stools or not. He had a bowel movement today, he saw blood again today but not as much. The stools themselves look normal, brown. Denies excessive bleeding with a small cuts, bleeding of the gum or in the urine.  .   Past Medical History:  Diagnosis Date  . CAD (coronary artery disease)    Two RCA stents remotely / 3rd RCA stent 2006  . Colon polyps    s/p several Cscopes.  Marland Kitchen COPD (chronic obstructive pulmonary disease) (HCC)    on O2, nocturnal  . Diabetes mellitus    dx aprox 2009  . ED (erectile dysfunction)    has a vacumm device  . Ejection fraction   . Hyperlipidemia    dx in 90s  . Hypertension    dx in the 90  . Hypogonadism male   . PVD (peripheral vascular disease) ([place])    s/p stents at LE 2009, Dr Einar Gip  . Shortness of breath    O2 Sat dropped to 82% walking on the treadmill, September, 2012    Past Surgical History:  Procedure Laterality Date  . APPENDECTOMY    . TONSILLECTOMY      Social History   Social History  . Marital status: Married    Spouse name: Cerrella   . Number of children: 6  . Years of  education: N/A   Occupational History  . 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 Smoking status: Former Smoker    Packs/day: 2.00    Years: 30.00    Types: Cigarettes    Quit date: 06/17/1977  . Smokeless tobacco: Never Used     Comment: 2 ppd, quit 1987  . Alcohol use 0.0 oz/week     Comment: socially   . Drug use: No  . Sexual activity: Not on file   Other Topics Concern  . Not on file   Social History Narrative   Has 2 step sons, and 4 children (lost 1 son)                      Allergies as of 02/27/2017   No Known Allergies     Medication List       Accurate as of 02/27/17 11:59 PM. Always use your most recent med list.          acyclovir ointment 5 % Commonly known as:  ZOVIRAX Apply 1 application topically every 3 (three) hours. Use for 5 days   amLODipine 10 MG tablet Commonly known as:  NORVASC TAKE 1 TABLET DAILY  aspirin 81 MG tablet Take 81 mg by mouth daily.   Dulaglutide 1.5 MG/0.5ML Sopn Commonly known as:  TRULICITY Inject weekly   glimepiride 4 MG tablet Commonly known as:  AMARYL Take 1 tablet (4 mg total) by mouth daily with breakfast.   losartan 100 MG tablet Commonly known as:  COZAAR Take 1 tablet (100 mg total) by mouth daily.   metFORMIN 1000 MG tablet Commonly known as:  GLUCOPHAGE TAKE 1 TABLET TWICE A DAY WITH MEALS   metoprolol succinate 100 MG 24 hr tablet Commonly known as:  TOPROL XL Take 1 tablet (100 mg total) by mouth daily.   multivitamin with minerals Tabs tablet Take 1 tablet by mouth daily.   simvastatin 20 MG tablet Commonly known as:  ZOCOR Take 1 tablet (20 mg total) by mouth at bedtime.   SPIRIVA HANDIHALER 18 MCG inhalation capsule Generic drug:  tiotropium INHALE THE CONTENTS OF 1 CAPSULE DAILY   Testosterone 20.25 MG/ACT (1.62%) Gel Place 4 application onto the skin daily.   triamterene-hydrochlorothiazide 37.5-25 MG tablet Commonly known as:   MAXZIDE-25 Take 1 tablet by mouth daily.   umeclidinium-vilanterol 62.5-25 MCG/INH Aepb Commonly known as:  ANORO ELLIPTA Inhale 1 puff into the lungs daily.   valACYclovir 500 MG tablet Commonly known as:  VALTREX Take 1 tablet (500 mg total) by mouth 2 (two) times daily.          Objective:   Physical Exam BP 126/72 (BP Location: Left Arm, Patient Position: Sitting, Cuff Size: Normal)   Pulse (!) 104   Temp 98 F (36.7 C) (Oral)   Resp 14   Ht 6\' 1"  (1.854 m)   Wt 247 lb 8 oz (112.3 kg)   SpO2 96%   BMI 32.65 kg/m  General:   Well developed, well nourished . NAD.  HEENT:  Normocephalic . Face symmetric, atraumatic  Abdomen:  Not distended, soft, non-tender. No rebound or rigidity.   DRE: Not repeated today Skin: Not pale. Not jaundice Neurologic:  alert & oriented X3.  Speech normal, gait appropriate for age and unassisted Psych--  Cognition and judgment appear intact.  Cooperative with normal attention span and concentration.  Behavior appropriate. No anxious or depressed appearing.     Assessment & Plan:  Assessment DM dx ~8366, complicated by CAD, PVD, mild neuropathy. Sees Dr. Dwyane Dee Hypogonadism. Sees Dr. Dwyane Dee HTN Hyperlipidemia COPD, nocturnal O2 prn Back pain, chronic: See visit 10/12/2016 CV: -CAD, Dr. Ron Parker Dr Meda Coffee S/p  stenting 2 to the RCA remote past and the third RCA stent in  2006, negative stress test in 2012, LVEF 55% on echo 2015 -Peripheral vascular disease, stents lower extremity 2009  ED  PLAN    Red blood per rectum: Patient had 2 episodes of red blood per rectum, yesterday and today, DRE at the ER yesterday show a small amount of blood, Hemoccult positive. He is asx, vital signs are stable, hemoglobin dropped less than 1 point when checked at the ER. He needs further eval given severity of bleeding per pt description,refer to GI, DC Plavix, continue aspirin, ER if symptoms severe. Check a CBC next week. CAD: Stable for years,  no recent stents. Will stop Plavix and notify cardiology, in case they have any suggestions. Continue aspirin 81. RTC by [phone].

## 2017-02-27 NOTE — Progress Notes (Signed)
Pre visit review using our clinic review tool, if applicable. No additional management support is needed unless otherwise documented below in the visit note. 

## 2017-02-28 ENCOUNTER — Encounter: Payer: Self-pay | Admitting: Internal Medicine

## 2017-02-28 ENCOUNTER — Ambulatory Visit (INDEPENDENT_AMBULATORY_CARE_PROVIDER_SITE_OTHER): Payer: Medicare Other | Admitting: Internal Medicine

## 2017-02-28 ENCOUNTER — Other Ambulatory Visit (INDEPENDENT_AMBULATORY_CARE_PROVIDER_SITE_OTHER): Payer: Medicare Other

## 2017-02-28 VITALS — BP 110/70 | HR 91 | Ht 73.0 in | Wt 245.6 lb

## 2017-02-28 DIAGNOSIS — E1165 Type 2 diabetes mellitus with hyperglycemia: Secondary | ICD-10-CM

## 2017-02-28 DIAGNOSIS — I2510 Atherosclerotic heart disease of native coronary artery without angina pectoris: Secondary | ICD-10-CM

## 2017-02-28 DIAGNOSIS — J9611 Chronic respiratory failure with hypoxia: Secondary | ICD-10-CM | POA: Diagnosis not present

## 2017-02-28 DIAGNOSIS — J449 Chronic obstructive pulmonary disease, unspecified: Secondary | ICD-10-CM

## 2017-02-28 LAB — COMPREHENSIVE METABOLIC PANEL
ALBUMIN: 3.5 g/dL (ref 3.5–5.2)
ALT: 27 U/L (ref 0–53)
AST: 16 U/L (ref 0–37)
Alkaline Phosphatase: 51 U/L (ref 39–117)
BILIRUBIN TOTAL: 0.6 mg/dL (ref 0.2–1.2)
BUN: 23 mg/dL (ref 6–23)
CALCIUM: 9 mg/dL (ref 8.4–10.5)
CO2: 27 mEq/L (ref 19–32)
Chloride: 105 mEq/L (ref 96–112)
Creatinine, Ser: 1.36 mg/dL (ref 0.40–1.50)
GFR: 65.01 mL/min (ref >60.00)
Glucose, Bld: 151 mg/dL — ABNORMAL HIGH (ref 70–99)
Potassium: 4 mEq/L (ref 3.5–5.1)
Sodium: 139 mEq/L (ref 135–145)
TOTAL PROTEIN: 6.2 g/dL (ref 6.0–8.3)

## 2017-02-28 LAB — MICROALBUMIN / CREATININE URINE RATIO
CREATININE, U: 175.3 mg/dL
MICROALB/CREAT RATIO: 0.4 mg/g (ref 0.0–30.0)
Microalb, Ur: <0.7 mg/dL (ref 0.0–1.9)

## 2017-02-28 LAB — HEMOGLOBIN A1C: Hgb A1c MFr Bld: 7.9 % — ABNORMAL HIGH (ref 4.6–6.5)

## 2017-02-28 NOTE — Assessment & Plan Note (Signed)
Red blood per rectum: Patient had 2 episodes of red blood per rectum, yesterday and today, DRE at the ER yesterday show a small amount of blood, Hemoccult positive. He is asx, vital signs are stable, hemoglobin dropped less than 1 point when checked at the ER. He needs further eval given severity of bleeding per pt description,refer to GI, DC Plavix, continue aspirin, ER if symptoms severe. Check a CBC next week. CAD: Stable for years, no recent stents. Will stop Plavix and notify cardiology, in case they have any suggestions. Continue aspirin 81. RTC by [phone].

## 2017-02-28 NOTE — Assessment & Plan Note (Signed)
He is a good candidate for portable oxygen concentrator with 2 L continuous flow. The gas exchange problem is reflected in his reduced diffusion capacity due to both cardiac and pulmonary components.

## 2017-02-28 NOTE — Assessment & Plan Note (Signed)
This is mostly emphysema involving alveoli with little airway obstruction proximally. I'm not surprised if his bronchodilators to make a big difference.

## 2017-02-28 NOTE — Patient Instructions (Signed)
Order- DME APS  Order POC 2L/m for portable O2 outside home. Continuous and portable O2 2L  Dx COPD mixed type, chronic respiratory failure with hypoxia    Please call as needed

## 2017-02-28 NOTE — Progress Notes (Signed)
HPI male former smoker followed for dyspnea/labored breathing, complicated by CAD/MI/ Stents/ PAD, DM PFT- 07/10/11-  Normal spirometry flows with small- airway response to bronchodilator, normal lung volumes and moderately reduced diffusion. FEV1/FVC 0.79, DLCO 58% 6MWT -06/18/12- 96%, 88%, 94% 449 M. Significant desaturation with exercise.  -----------------------------------------------------------------------------------------------------------------------------   12/12/2015-81 year old male former smoker followed for dyspnea/labored breathing, complicated by CAD/MI, DM O2 2 L/APS-sleep and exertion Follows for: COPD. Pt c/o minimal SOB, some wheeze. Pt denies CP/tightness/cough. Pt has been going to the gym regularly and feels it has helped him a lot. Pt does not use albuterol HFA on a regular basis.  He reports only minimal awareness of dyspnea on exertion. Uses oxygen for sleep intermittently as needed. No recent cardiac events or acute respiratory infections. Continues Symbicort daily but has rarely needed rescue inhaler anymore. No routine cough or phlegm, chest pain, palpitations or fever.  01/24/17- 81 year old male former smoker followed for COPD mixed type, dyspnea/labored breathing, complicated by CAD/MI/ stents/ PAD, DM O2 2 L/APS-sleep and exertion FOLLOWS FOR: Pt states he stays SOB all the time no matter. Pt would like to go back on Symbicort instead of Advair-can tell a huge difference in breathing. He tells me what he has is stable dyspnea on exertion, not supine or sitting at rest. He is not using his oxygen at all. I noted that hemoglobin 18 recorded over the past couple of years, which he says he was not aware of. Admits a little wheeze occasionally but no coughing. CXR 12/12/15 IMPRESSION: No acute cardiopulmonary abnormality. Polycythemia noted 04/02/16-hemoglobin 18  02/28/17- 81 year old male former smoker followed for dyspnea/labored breathing, complicated by CAD/MI/  stents/ PAD, DM, polycythemia O2 2 L/APS-sleep and exertion Overnight Oximetry 02/07/17-room air-sustained desaturation less than or equal to 88% for over 7 hours  Follows For: emphysema, needs order for O2, ONO done and wants to discuss results He was trying Symbicort again, not feeling Advair worked well for him. Very little wheeze or cough noted. He has been using oxygen routinely for sleep. Most recent hemoglobin was in normal range. He had spoken with his DME company about getting a portable oxygen concentrator for travel with a trip to [place] upcoming. CXR 02/15/17 IMPRESSION: Stable from prior.  No evidence of active disease.  ROS-see HPI Constitutional:   No-   weight loss, night sweats, fevers, chills, fatigue, lassitude. HEENT:   No-  headaches, difficulty swallowing, tooth/dental problems, sore throat,       No-  sneezing, itching, ear ache, nasal congestion, post nasal drip,  CV:  No-   chest pain, orthopnea, PND, swelling in lower extremities, anasarca, dizziness, palpitations Resp:   + "shortness of breath with exertion or at rest.              No-   productive cough,  No non-productive cough,  No- coughing up of blood.              No-   change in color of mucus.  + wheezing.   Skin: No-   rash or lesions. GI:  No-   heartburn, indigestion, abdominal pain, nausea, vomiting, GU:  MS:  No-   joint pain or swelling.   Neuro-     nothing unusual Psych:  No- change in mood or affect. No depression or anxiety.  No memory loss.  OBJ- Physical Exam     General- Alert, Oriented, Affect-appropriate, Distress- none acute.  Skin- rash-none, lesions- none, excoriation- none Lymphadenopathy- none Head- atraumatic  Eyes- Gross vision intact, PERRLA, conjunctivae and secretions clear            Ears- Hearing, canals-normal            Nose- Clear, no-Septal dev, mucus, polyps, erosion, perforation             Throat- Mallampati II , mucosa clear , drainage- none, tonsils-  atrophic Neck- flexible , trachea midline, no stridor , thyroid nl, carotid no bruit Chest - symmetrical excursion , unlabored           Heart/CV- RRR , no murmur , no gallop  , no rub, nl s1 s2                           - JVD+ 1 cm , edema- none, stasis changes- none, varices- none           Lung- + distant/quiet/ unlabored, cough-none , dullness-none, rub- none           Chest wall-  Abd-  Br/ Gen/ Rectal- Not done, not indicated Extrem- cyanosis- none, clubbing?- Question mild spooning of nails; atrophy- none, strength- nl Neuro- grossly intact to observation

## 2017-03-04 ENCOUNTER — Other Ambulatory Visit (INDEPENDENT_AMBULATORY_CARE_PROVIDER_SITE_OTHER): Payer: Medicare Other

## 2017-03-04 ENCOUNTER — Telehealth: Payer: Self-pay | Admitting: Endocrinology

## 2017-03-04 ENCOUNTER — Ambulatory Visit (INDEPENDENT_AMBULATORY_CARE_PROVIDER_SITE_OTHER): Payer: Medicare Other | Admitting: Endocrinology

## 2017-03-04 ENCOUNTER — Encounter: Payer: Self-pay | Admitting: Endocrinology

## 2017-03-04 VITALS — BP 122/84 | HR 84 | Ht 73.0 in | Wt 247.2 lb

## 2017-03-04 DIAGNOSIS — I10 Essential (primary) hypertension: Secondary | ICD-10-CM

## 2017-03-04 DIAGNOSIS — E1165 Type 2 diabetes mellitus with hyperglycemia: Secondary | ICD-10-CM | POA: Diagnosis not present

## 2017-03-04 DIAGNOSIS — K922 Gastrointestinal hemorrhage, unspecified: Secondary | ICD-10-CM | POA: Diagnosis not present

## 2017-03-04 DIAGNOSIS — I2510 Atherosclerotic heart disease of native coronary artery without angina pectoris: Secondary | ICD-10-CM

## 2017-03-04 DIAGNOSIS — E291 Testicular hypofunction: Secondary | ICD-10-CM

## 2017-03-04 LAB — CBC WITH DIFFERENTIAL/PLATELET
BASOS PCT: 1 % (ref 0.0–3.0)
Basophils Absolute: 0.1 10*3/uL (ref 0.0–0.1)
EOS PCT: 2.1 % (ref 0.0–5.0)
Eosinophils Absolute: 0.2 10*3/uL (ref 0.0–0.7)
HCT: 41.7 % (ref 39.0–52.0)
Hemoglobin: 13.9 g/dL (ref 13.0–17.0)
LYMPHS ABS: 3.6 10*3/uL (ref 0.7–4.0)
Lymphocytes Relative: 34.4 % (ref 12.0–46.0)
MCHC: 33.4 g/dL (ref 30.0–36.0)
MCV: 99.8 fl (ref 78.0–100.0)
MONO ABS: 1.1 10*3/uL — AB (ref 0.1–1.0)
Monocytes Relative: 10.6 % (ref 3.0–12.0)
NEUTROS ABS: 5.4 10*3/uL (ref 1.4–7.7)
Neutrophils Relative %: 51.9 % (ref 43.0–77.0)
PLATELETS: 241 10*3/uL (ref 150.0–400.0)
RBC: 4.18 Mil/uL — ABNORMAL LOW (ref 4.22–5.81)
RDW: 15 % (ref 11.5–15.5)
WBC: 10.4 10*3/uL (ref 4.0–10.5)

## 2017-03-04 NOTE — Telephone Encounter (Signed)
Patient called to advise Dr. Dwyane Dee that Jonathan [name] is acceptable. Patient was seen at the office this morning.

## 2017-03-04 NOTE — Telephone Encounter (Signed)
Please send prescription for 100 mg daily

## 2017-03-04 NOTE — Progress Notes (Signed)
  Patient ID: Jonathan E Narula Jr., male   DOB: 12/18/1935, 81 y.o.   MRN: [id number]    Reason for Appointment:  Follow-up  History of Present Illness:          Diagnosis: Type 2 diabetes mellitus, date of diagnosis:  2009      Past history: He is not clear what his diabetes was diagnosed but according to hospital records it may have been in 2009. Most likely he was placed on metformin initially and at some point Amaryl added. In 2013 it was also given Januvia presumably to improve his control. However appears that his A1c has been consistently over 7% Janumet was started instead of metformin and Januvia separately in 2013  He was started on Trulicity in 6/15 instead of Victoza because excessive bruising on his abdomen  Later in 01/2015 he was switched to Bydureon because of insurance noncoverage of his Trulicity  Recent history:   Non-insulin hypoglycemic drugs the patient is taking are: Metformin 1000 a day, Amaryl 4 mg 1-1/2 in the morning, Trulicity 1.5 mg weekly  His A1c has been progressively higher at 7.9, previously as low as 7.4  Current blood sugar patterns and problems:  He is checking his blood sugars mostly in the morning and not after meals as directed  He has had a couple of readings over 200 after meals in the evening  However fasting readings are variable and as high as 193  He is still able to continue exercise regimen and is losing weight since his last visit  Also taking metformin but did not increased to twice a day as recommended  No side effects with 1.5 mg weekly of the Trulicity    Side effects from medications have been: None  Glucose monitoring:  done  Once a day         Glucometer:  FreeStyle Blood Glucose readings  by download  FASTING readings recently 141-193 and evening 213, 205 Overall average 169  Self-care:      Meals: 3 meals per day. eating egg/meat breading for breakfast, dinner 5-6    Exercise: Going 3-4/7 with cardio around  50 minutes at the gym Dietician visit: Most recent: 4/16 .               Weight history:  Wt Readings from Last 3 Encounters:  03/04/17 247 lb 3.2 oz (112.1 kg)  02/28/17 245 lb 9.6 oz (111.4 kg)  02/27/17 247 lb 8 oz (112.3 kg)   Glycemic control:  Lab Results  Component Value Date   HGBA1C 7.9 (H) 02/28/2017   HGBA1C 7.7 (H) 10/08/2016   HGBA1C 7.4 (H) 07/09/2016   Lab Results  Component Value Date   MICROALBUR <0.7 02/28/2017   LDLCALC 47 02/13/2017   CREATININE 1.36 02/28/2017    Lab Results  Component Value Date   FRUCTOSAMINE 268 12/06/2016   FRUCTOSAMINE 282 02/22/2014    OTHER active problems: See review of systems  Lab on 02/28/2017  Component Date Value Ref Range Status  . Hgb A1c MFr Bld 02/28/2017 7.9* 4.6 - 6.5 % Final   Glycemic Control Guidelines for People with Diabetes:Non Diabetic:  <6%Goal of Therapy: <7%Additional Action Suggested:  >8%   . Sodium 02/28/2017 139  135 - 145 mEq/L Final  . Potassium 02/28/2017 4.0  3.5 - 5.1 mEq/L Final  . Chloride 02/28/2017 105  96 - 112 mEq/L Final  . CO2 02/28/2017 27  19 - 32 mEq/L Final  . Glucose, Bld   02/28/2017 151* 70 - 99 mg/dL Final  . BUN 02/28/2017 23  6 - 23 mg/dL Final  . Creatinine, Ser 02/28/2017 1.36  0.40 - 1.50 mg/dL Final  . Total Bilirubin 02/28/2017 0.6  0.2 - 1.2 mg/dL Final  . Alkaline Phosphatase 02/28/2017 51  39 - 117 U/L Final  . AST 02/28/2017 16  0 - 37 U/L Final  . ALT 02/28/2017 27  0 - 53 U/L Final  . Total Protein 02/28/2017 6.2  6.0 - 8.3 g/dL Final  . Albumin 02/28/2017 3.5  3.5 - 5.2 g/dL Final  . Calcium 02/28/2017 9.0  8.4 - 10.5 mg/dL Final  . GFR 02/28/2017 65.01  >60.00 mL/min Final  . Microalb, Ur 02/28/2017 <0.7  0.0 - 1.9 mg/dL Final  . Creatinine,U 02/28/2017 175.3  mg/dL Final  . Microalb Creat Ratio 02/28/2017 0.4  0.0 - 30.0 mg/g Final  Admission on 02/26/2017, Discharged on 02/26/2017  Component Date Value Ref Range Status  . Sodium 02/26/2017 137  135 - 145  mmol/L Final  . Potassium 02/26/2017 4.0  3.5 - 5.1 mmol/L Final  . Chloride 02/26/2017 107  101 - 111 mmol/L Final  . CO2 02/26/2017 22  22 - 32 mmol/L Final  . Glucose, Bld 02/26/2017 228* 65 - 99 mg/dL Final  . BUN 02/26/2017 23* 6 - 20 mg/dL Final  . Creatinine, Ser 02/26/2017 1.25* 0.61 - 1.24 mg/dL Final  . Calcium 02/26/2017 8.6* 8.9 - 10.3 mg/dL Final  . Total Protein 02/26/2017 6.2* 6.5 - 8.1 g/dL Final  . Albumin 02/26/2017 3.3* 3.5 - 5.0 g/dL Final  . AST 02/26/2017 25  15 - 41 U/L Final  . ALT 02/26/2017 32  17 - 63 U/L Final  . Alkaline Phosphatase 02/26/2017 55  38 - 126 U/L Final  . Total Bilirubin 02/26/2017 0.7  0.3 - 1.2 mg/dL Final  . GFR calc non Af Amer 02/26/2017 53* >60 mL/min Final  . GFR calc Af Amer 02/26/2017 >60  >60 mL/min Final   Comment: (NOTE) The eGFR has been calculated using the CKD EPI equation. This calculation has not been validated in all clinical situations. eGFR's persistently <60 mL/min signify possible Chronic Kidney Disease.   . Anion gap 02/26/2017 8  5 - 15 Final  . WBC 02/26/2017 10.2  4.0 - 10.5 K/uL Final  . RBC 02/26/2017 4.75  4.22 - 5.81 MIL/uL Final  . Hemoglobin 02/26/2017 15.9  13.0 - 17.0 g/dL Final  . HCT 02/26/2017 46.1  39.0 - 52.0 % Final  . MCV 02/26/2017 97.1  78.0 - 100.0 fL Final  . MCH 02/26/2017 33.5  26.0 - 34.0 pg Final  . MCHC 02/26/2017 34.5  30.0 - 36.0 g/dL Final  . RDW 02/26/2017 14.2  11.5 - 15.5 % Final  . Platelets 02/26/2017 227  150 - 400 K/uL Final  . ABO/RH(D) 02/26/2017 O POS   Final  . Antibody Screen 02/26/2017 NEG   Final  . Sample Expiration 02/26/2017 03/01/2017   Final  . Fecal Occult Bld 02/26/2017 POSITIVE* NEGATIVE Final  . ABO/RH(D) 02/26/2017 O POS   Final     Allergies as of 03/04/2017   No Known Allergies     Medication List       Accurate as of 03/04/17  9:09 AM. Always use your most recent med list.          amLODipine 10 MG tablet Commonly known as:  NORVASC TAKE 1  TABLET DAILY   aspirin 81 MG  tablet Take 81 mg by mouth daily.   Dulaglutide 1.5 MG/0.5ML Sopn Commonly known as:  TRULICITY Inject weekly   glimepiride 4 MG tablet Commonly known as:  AMARYL Take 1 tablet (4 mg total) by mouth daily with breakfast.   losartan 100 MG tablet Commonly known as:  COZAAR Take 1 tablet (100 mg total) by mouth daily.   metFORMIN 1000 MG tablet Commonly known as:  GLUCOPHAGE TAKE 1 TABLET TWICE A DAY WITH MEALS   metoprolol succinate 100 MG 24 hr tablet Commonly known as:  TOPROL XL Take 1 tablet (100 mg total) by mouth daily.   multivitamin with minerals Tabs tablet Take 1 tablet by mouth daily.   simvastatin 20 MG tablet Commonly known as:  ZOCOR Take 1 tablet (20 mg total) by mouth at bedtime.   SPIRIVA HANDIHALER 18 MCG inhalation capsule Generic drug:  tiotropium INHALE THE CONTENTS OF 1 CAPSULE DAILY   Testosterone 20.25 MG/ACT (1.62%) Gel Place 4 application onto the skin daily.   triamterene-hydrochlorothiazide 37.5-25 MG tablet Commonly known as:  MAXZIDE-25 Take 1 tablet by mouth daily.   umeclidinium-vilanterol 62.5-25 MCG/INH Aepb Commonly known as:  ANORO ELLIPTA Inhale 1 puff into the lungs daily.       Allergies: No Known Allergies  Past Medical History:  Diagnosis Date  . CAD (coronary artery disease)    Two RCA stents remotely / 3rd RCA stent 2006  . Colon polyps    s/p several Cscopes.  . COPD (chronic obstructive pulmonary disease) (HCC)    on O2, nocturnal  . Diabetes mellitus    dx aprox 2009  . ED (erectile dysfunction)    has a vacumm device  . Ejection fraction   . Hyperlipidemia    dx in 90s  . Hypertension    dx in the 90  . Hypogonadism male   . PVD (peripheral vascular disease) (HCC)    s/p stents at LE 2009, Dr Ganji  . Shortness of breath    O2 Sat dropped to 82% walking on the treadmill, September, 2012    Past Surgical History:  Procedure Laterality Date  . APPENDECTOMY    .  TONSILLECTOMY      Family History  Problem Relation Age of Onset  . Heart disease Father   . Hypertension Father   . Stroke Father   . Diabetes Paternal Aunt   . Diabetes Maternal Grandmother   . Diabetes Other        GM, nephews, many family members  . Hyperlipidemia Other        ?  . Colon cancer Neg Hx   . Prostate cancer Neg Hx     Social History:  reports that he quit smoking about 39 years ago. His smoking use included Cigarettes. He has a 60.00 pack-year smoking history. He has never used smokeless tobacco. He reports that he drinks alcohol. He reports that he does not use drugs.    Review of Systems        Lipids: He has been treated with simvastatin 20 mg with good control       Lab Results  Component Value Date   CHOL 99 02/13/2017   HDL 35.50 (L) 02/13/2017   LDLCALC 47 02/13/2017   TRIG 84.0 02/13/2017   CHOLHDL 3 02/13/2017                      Has had erectile dysfunction for quite sometime.  Also had decreased libido previously   and was found to have hypogonadism, probably in 2011. Original evaluation is not available.   He did have a slightly low free testosterone level in 2012 on treatment Prolactin level normal   Symptoms are controlled with AndroGel 4 pumps without any fatigue  On his last visit his testosterone level was Normal He now has been compliant with the testosterone gel PSA normal  Lab Results  Component Value Date   TESTOSTERONE 433.04 10/08/2016   Lab Results  Component Value Date   PSA 0.56 04/02/2016   PSA 0.60 01/20/2014   PSA 0.59 12/11/2011         HYPERTENSION: This is followed by PCP and is on 100 mg of losartan along with Maxide, amlodipine and metoprolol  Blood pressure Controlled  BP Readings from Last 3 Encounters:  03/04/17 122/84  02/28/17 110/70  02/27/17 126/72     LABS:  Lab on 02/28/2017  Component Date Value Ref Range Status  . Hgb A1c MFr Bld 02/28/2017 7.9* 4.6 - 6.5 % Final   Glycemic Control  Guidelines for People with Diabetes:Non Diabetic:  <6%Goal of Therapy: <7%Additional Action Suggested:  >8%   . Sodium 02/28/2017 139  135 - 145 mEq/L Final  . Potassium 02/28/2017 4.0  3.5 - 5.1 mEq/L Final  . Chloride 02/28/2017 105  96 - 112 mEq/L Final  . CO2 02/28/2017 27  19 - 32 mEq/L Final  . Glucose, Bld 02/28/2017 151* 70 - 99 mg/dL Final  . BUN 02/28/2017 23  6 - 23 mg/dL Final  . Creatinine, Ser 02/28/2017 1.36  0.40 - 1.50 mg/dL Final  . Total Bilirubin 02/28/2017 0.6  0.2 - 1.2 mg/dL Final  . Alkaline Phosphatase 02/28/2017 51  39 - 117 U/L Final  . AST 02/28/2017 16  0 - 37 U/L Final  . ALT 02/28/2017 27  0 - 53 U/L Final  . Total Protein 02/28/2017 6.2  6.0 - 8.3 g/dL Final  . Albumin 02/28/2017 3.5  3.5 - 5.2 g/dL Final  . Calcium 02/28/2017 9.0  8.4 - 10.5 mg/dL Final  . GFR 02/28/2017 65.01  >60.00 mL/min Final  . Microalb, Ur 02/28/2017 <0.7  0.0 - 1.9 mg/dL Final  . Creatinine,U 02/28/2017 175.3  mg/dL Final  . Microalb Creat Ratio 02/28/2017 0.4  0.0 - 30.0 mg/g Final  Admission on 02/26/2017, Discharged on 02/26/2017  Component Date Value Ref Range Status  . Sodium 02/26/2017 137  135 - 145 mmol/L Final  . Potassium 02/26/2017 4.0  3.5 - 5.1 mmol/L Final  . Chloride 02/26/2017 107  101 - 111 mmol/L Final  . CO2 02/26/2017 22  22 - 32 mmol/L Final  . Glucose, Bld 02/26/2017 228* 65 - 99 mg/dL Final  . BUN 02/26/2017 23* 6 - 20 mg/dL Final  . Creatinine, Ser 02/26/2017 1.25* 0.61 - 1.24 mg/dL Final  . Calcium 02/26/2017 8.6* 8.9 - 10.3 mg/dL Final  . Total Protein 02/26/2017 6.2* 6.5 - 8.1 g/dL Final  . Albumin 02/26/2017 3.3* 3.5 - 5.0 g/dL Final  . AST 02/26/2017 25  15 - 41 U/L Final  . ALT 02/26/2017 32  17 - 63 U/L Final  . Alkaline Phosphatase 02/26/2017 55  38 - 126 U/L Final  . Total Bilirubin 02/26/2017 0.7  0.3 - 1.2 mg/dL Final  . GFR calc non Af Amer 02/26/2017 53* >60 mL/min Final  . GFR calc Af Amer 02/26/2017 >60  >60 mL/min Final   Comment:  (NOTE) The eGFR has been calculated using the   CKD EPI equation. This calculation has not been validated in all clinical situations. eGFR's persistently <60 mL/min signify possible Chronic Kidney Disease.   . Anion gap 02/26/2017 8  5 - 15 Final  . WBC 02/26/2017 10.2  4.0 - 10.5 K/uL Final  . RBC 02/26/2017 4.75  4.22 - 5.81 MIL/uL Final  . Hemoglobin 02/26/2017 15.9  13.0 - 17.0 g/dL Final  . HCT 02/26/2017 46.1  39.0 - 52.0 % Final  . MCV 02/26/2017 97.1  78.0 - 100.0 fL Final  . MCH 02/26/2017 33.5  26.0 - 34.0 pg Final  . MCHC 02/26/2017 34.5  30.0 - 36.0 g/dL Final  . RDW 02/26/2017 14.2  11.5 - 15.5 % Final  . Platelets 02/26/2017 227  150 - 400 K/uL Final  . ABO/RH(D) 02/26/2017 O POS   Final  . Antibody Screen 02/26/2017 NEG   Final  . Sample Expiration 02/26/2017 03/01/2017   Final  . Fecal Occult Bld 02/26/2017 POSITIVE* NEGATIVE Final  . ABO/RH(D) 02/26/2017 O POS   Final    Physical Examination:  BP 122/84   Pulse 84   Ht 6' 1" (1.854 m)   Wt 247 lb 3.2 oz (112.1 kg)   SpO2 95%   BMI 32.61 kg/m   No ankle edema present    ASSESSMENT/PLAN:   Diabetes type 2, with obesity See history of present illness for detailed discussion of current diabetes management, blood sugar patterns and problems identified  A1c is being inadequately controlled with progressive increase in it is now about 8% He appears to have mostly postprandial hyperglycemia despite generally watching his diet and carbohydrate intake Also continuing to lose weight partly from using Trulicity and keeping up with exercise  Discussed with the patient that likely he is getting progressive insulin deficiency Most likely Amaryl will not be able to improve his control long-term Also he does need to maximize his metformin   Since he has fairly close to normal renal function he is a good candidate for an SGLT 2 inhibitors  Discussed action of SGLT 2 drugs on lowering glucose by decreasing kidney  absorption of glucose, benefits of weight loss and lower blood pressure, possible side effects including candidiasis, precautions and labeling for Invokana and dosage regimen  Discussed that benefits are much more than possible risks Since is unclear which of the medications and the classes covered by his insurance he will need to call and let us know  He is not checking enough readings after meals and will need to start doing so  Discussed blood sugar targets at various times  HYPERTENSION: Well controlled and will recheck renal function and potassium on next visit  Hypogonadism: Follow up on his next visit  Counseling time on subjects discussed above is over 50% of today's 25 minute visit    Patient Instructions  Need to alternate the morning sugar testing with reading after meal at least once a day, check about 2 hours later  With starting either Invokana, Jardiance or Farxiga will need to reduce the Maxzide in half Also will reduce limit glimepiride down to 1 tablet Increased metformin to twice a day  May need to increase fluid intake the first week when starting the new medication     Mihcael Ledee 03/04/2017, 9:09 AM   Note: This office note was prepared with Estate agent. Any transcriptional errors that result from this process are unintentional.

## 2017-03-04 NOTE — Telephone Encounter (Signed)
Please advise 

## 2017-03-04 NOTE — Patient Instructions (Signed)
Need to alternate the morning sugar testing with reading after meal at least once a day, check about 2 hours later  With starting either Invokana, Jardiance or [place] will need to reduce the Maxzide in half Also will reduce limit glimepiride down to 1 tablet Increased metformin to twice a day  May need to increase fluid intake the first week when starting the new medication

## 2017-03-05 ENCOUNTER — Other Ambulatory Visit: Payer: Self-pay

## 2017-03-05 DIAGNOSIS — Z86010 Personal history of colonic polyps: Secondary | ICD-10-CM | POA: Diagnosis not present

## 2017-03-05 DIAGNOSIS — K5730 Diverticulosis of large intestine without perforation or abscess without bleeding: Secondary | ICD-10-CM | POA: Diagnosis not present

## 2017-03-05 DIAGNOSIS — E6609 Other obesity due to excess calories: Secondary | ICD-10-CM | POA: Diagnosis not present

## 2017-03-05 MED ORDER — CANAGLIFLOZIN 100 MG PO TABS: 100.0000 mg | ORAL_TABLET | Freq: Every day | ORAL | 0 refills | Status: DC

## 2017-03-05 NOTE — Telephone Encounter (Signed)
I have sent this Rx to you to be signed and sending to [place] for him to pick up.

## 2017-03-11 ENCOUNTER — Other Ambulatory Visit: Payer: Self-pay

## 2017-03-11 MED ORDER — EMPAGLIFLOZIN 10 MG PO TABS: 10.0000 mg | ORAL_TABLET | Freq: Every day | ORAL | 3 refills | Status: DC

## 2017-03-15 ENCOUNTER — Telehealth: Payer: Self-pay | Admitting: Internal Medicine

## 2017-03-15 ENCOUNTER — Telehealth: Payer: Self-pay | Admitting: Endocrinology

## 2017-03-15 NOTE — Telephone Encounter (Signed)
Pt says that he will now have his medications sent through the [place]. He says that they would need to have a copy of his most recent lab results and also office notes.  Pt says that he will come by to pick up when ready.

## 2017-03-15 NOTE — Telephone Encounter (Signed)
Patient would like to pick up a copy of most recent office note and lab on Monday to take to the [place].

## 2017-03-15 NOTE — Telephone Encounter (Signed)
OV note and lab results printed from 02/27/2017, Pt informed that records are ready. Pt verbalized understanding.

## 2017-03-18 NOTE — Telephone Encounter (Signed)
Also printed out his most recent lab results for him to take.

## 2017-03-18 NOTE — Telephone Encounter (Signed)
Patient came in on Monday morning and I printed out the last office visit for him.

## 2017-03-22 ENCOUNTER — Telehealth: Payer: Self-pay | Admitting: Internal Medicine

## 2017-03-22 NOTE — Telephone Encounter (Signed)
I called again and talked to Manuela Schwartz and left another message and sent a Community message again.

## 2017-03-22 NOTE — Telephone Encounter (Signed)
I have called [facility] talked to Manuela Schwartz she will have the lady who does the POC's call me

## 2017-03-25 ENCOUNTER — Telehealth: Payer: Self-pay

## 2017-03-25 NOTE — Telephone Encounter (Signed)
Commuinty message received from [place] @ [facility] they did receive this order and will be calling the patient.

## 2017-03-25 NOTE — Telephone Encounter (Signed)
Spoke with he patients wife and informed her of the pharmacy requesting a PA for Invokana- in a previous note from last month the patient stated the insurance company would cover [place]- I requested the patient call and find out what his insurance will cover and call us back- patient's wife stated an understanding and had no further questions at that time

## 2017-03-25 NOTE — Telephone Encounter (Signed)
Spoke to the patient and he stated he was switched to Jardiance by Dr. Dwyane Dee at his last visit and no longer needs PA for [facility]

## 2017-03-27 ENCOUNTER — Ambulatory Visit: Payer: Medicare Other | Admitting: Podiatry

## 2017-03-29 ENCOUNTER — Other Ambulatory Visit (INDEPENDENT_AMBULATORY_CARE_PROVIDER_SITE_OTHER): Payer: Medicare Other

## 2017-03-29 DIAGNOSIS — E1165 Type 2 diabetes mellitus with hyperglycemia: Secondary | ICD-10-CM | POA: Diagnosis not present

## 2017-03-29 DIAGNOSIS — E291 Testicular hypofunction: Secondary | ICD-10-CM | POA: Diagnosis not present

## 2017-03-29 LAB — BASIC METABOLIC PANEL
BUN: 29 mg/dL — ABNORMAL HIGH (ref 6–23)
CALCIUM: 9.5 mg/dL (ref 8.4–10.5)
CHLORIDE: 104 meq/L (ref 96–112)
CO2: 23 meq/L (ref 19–32)
Creatinine, Ser: 1.62 mg/dL — ABNORMAL HIGH (ref 0.40–1.50)
GFR: 53.11 mL/min — ABNORMAL LOW (ref >60.00)
Glucose, Bld: 207 mg/dL — ABNORMAL HIGH (ref 70–99)
POTASSIUM: 3.7 meq/L (ref 3.5–5.1)
SODIUM: 140 meq/L (ref 135–145)

## 2017-03-29 LAB — TESTOSTERONE: Testosterone: 162.24 ng/dL — ABNORMAL LOW (ref 300.00–890.00)

## 2017-03-30 LAB — FRUCTOSAMINE: FRUCTOSAMINE: 284 umol/L (ref 0–285)

## 2017-04-03 ENCOUNTER — Encounter: Payer: Self-pay | Admitting: Endocrinology

## 2017-04-03 ENCOUNTER — Ambulatory Visit (INDEPENDENT_AMBULATORY_CARE_PROVIDER_SITE_OTHER): Payer: Medicare Other | Admitting: Endocrinology

## 2017-04-03 VITALS — BP 96/60 | HR 94 | Ht 73.0 in | Wt 244.6 lb

## 2017-04-03 DIAGNOSIS — I952 Hypotension due to drugs: Secondary | ICD-10-CM

## 2017-04-03 DIAGNOSIS — I2510 Atherosclerotic heart disease of native coronary artery without angina pectoris: Secondary | ICD-10-CM | POA: Diagnosis not present

## 2017-04-03 DIAGNOSIS — N289 Disorder of kidney and ureter, unspecified: Secondary | ICD-10-CM | POA: Diagnosis not present

## 2017-04-03 DIAGNOSIS — E1165 Type 2 diabetes mellitus with hyperglycemia: Secondary | ICD-10-CM | POA: Diagnosis not present

## 2017-04-03 NOTE — Progress Notes (Signed)
Patient ID: Jonathan Leather., male   DOB: Mar 03, 1936, 81 y.o.   MRN: [id number]    Reason for Appointment:  Follow-up  History of Present Illness:          Diagnosis: Type 2 diabetes mellitus, date of diagnosis:  2009      Past history: He is not clear what his diabetes was diagnosed but according to hospital records it may have been in 2009. Most likely he was placed on metformin initially and at some point Amaryl added. In 2013 it was also given Januvia presumably to improve his control. However appears that his A1c has been consistently over 7% Janumet was started instead of metformin and Januvia separately in 2013  He was started on Trulicity in 6/37 instead of Victoza because excessive bruising on his abdomen  Later in 01/2015 he was switched to Bydureon because of insurance noncoverage of his Trulicity  Recent history:   Non-insulin hypoglycemic drugs the patient is taking are: Metformin 1000 twice a day, Amaryl 4 mg  in the morning, Trulicity 1.5 mg weekly, Jardiance 10 mg daily  His A1c has been progressively higher at 7.9 in June, previously as low as 7.4  Current blood sugar patterns and problems:  He is now taking Jardiance, his Anastasio Auerbach was denied by the insurance  Was told to take only 1 tablet Amaryl instead of 1-1/2 and increase metformin to twice a day also; he is continuing Trulicity  Previously was having some readings over 200 after evening meal but his blood sugars are now overall significantly better  Not clear why his fasting reading was over 200 in the lab, otherwise FASTING readings range from 1 21-160 at home  His nonfasting readings are ranging from 100 up to 155 at some readings around 9-10 PM  He has continued his exercise as before  He has lost 3 pounds and also he thinks he is still consistent with his diet    Side effects from medications have been: None  Glucose monitoring:  done  Once a day         Glucometer:  FreeStyle Blood  Glucose readings  by download as above  AVERAGE recently 137, previously 169  Self-care:      Meals: 3 meals per day. eating egg/meat breading for breakfast, dinner 5-6    Exercise: Going 3-4/7 with cardio around 50 minutes at the gym Dietician visit: Most recent: 4/16 .               Weight history:  Wt Readings from Last 3 Encounters:  04/03/17 244 lb 9.6 oz (110.9 kg)  03/04/17 247 lb 3.2 oz (112.1 kg)  02/28/17 245 lb 9.6 oz (111.4 kg)   Glycemic control:  Lab Results  Component Value Date   HGBA1C 7.9 (H) 02/28/2017   HGBA1C 7.7 (H) 10/08/2016   HGBA1C 7.4 (H) 07/09/2016   Lab Results  Component Value Date   MICROALBUR <0.7 02/28/2017   LDLCALC 47 02/13/2017   CREATININE 1.62 (H) 03/29/2017    Lab Results  Component Value Date   FRUCTOSAMINE 284 03/29/2017   FRUCTOSAMINE 268 12/06/2016   FRUCTOSAMINE 282 02/22/2014    OTHER active problems: See review of systems  Lab on 03/29/2017  Component Date Value Ref Range Status  . Sodium 03/29/2017 140  135 - 145 mEq/L Final  . Potassium 03/29/2017 3.7  3.5 - 5.1 mEq/L Final  . Chloride 03/29/2017 104  96 - 112 mEq/L Final  . CO2  03/29/2017 23  19 - 32 mEq/L Final  . Glucose, Bld 03/29/2017 207* 70 - 99 mg/dL Final  . BUN 03/29/2017 29* 6 - 23 mg/dL Final  . Creatinine, Ser 03/29/2017 1.62* 0.40 - 1.50 mg/dL Final  . Calcium 03/29/2017 9.5  8.4 - 10.5 mg/dL Final  . GFR 03/29/2017 53.11* >60.00 mL/min Final  . Fructosamine 03/29/2017 284  0 - 285 umol/L Final   Comment: Published reference interval for apparently healthy subjects between age 78 and 4 is 62 - 285 umol/L and in a poorly controlled diabetic population is 228 - 563 umol/L with a mean of 396 umol/L.   Marland Kitchen Testosterone 03/29/2017 162.24* 300.00 - 890.00 ng/dL Final     Allergies as of 04/03/2017   No Known Allergies     Medication List       Accurate as of 04/03/17 10:46 AM. Always use your most recent med list.          amLODipine 10 MG  tablet Commonly known as:  NORVASC TAKE 1 TABLET DAILY   aspirin 81 MG tablet Take 81 mg by mouth daily.   Dulaglutide 1.5 MG/0.5ML Sopn Commonly known as:  TRULICITY Inject weekly   empagliflozin 10 MG Tabs tablet Commonly known as:  JARDIANCE Take 10 mg by mouth daily.   glimepiride 4 MG tablet Commonly known as:  AMARYL Take 1 tablet (4 mg total) by mouth daily with breakfast.   losartan 100 MG tablet Commonly known as:  COZAAR Take 1 tablet (100 mg total) by mouth daily.   metFORMIN 1000 MG tablet Commonly known as:  GLUCOPHAGE TAKE 1 TABLET TWICE A DAY WITH MEALS   metoprolol succinate 100 MG 24 hr tablet Commonly known as:  TOPROL XL Take 1 tablet (100 mg total) by mouth daily.   multivitamin with minerals Tabs tablet Take 1 tablet by mouth daily.   simvastatin 20 MG tablet Commonly known as:  ZOCOR Take 1 tablet (20 mg total) by mouth at bedtime.   SPIRIVA HANDIHALER 18 MCG inhalation capsule Generic drug:  tiotropium INHALE THE CONTENTS OF 1 CAPSULE DAILY   Testosterone 20.25 MG/ACT (1.62%) Gel Place 4 application onto the skin daily.   triamterene-hydrochlorothiazide 37.5-25 MG tablet Commonly known as:  MAXZIDE-25 Take 1 tablet by mouth daily.   umeclidinium-vilanterol 62.5-25 MCG/INH Aepb Commonly known as:  ANORO ELLIPTA Inhale 1 puff into the lungs daily.       Allergies: No Known Allergies  Past Medical History:  Diagnosis Date  . CAD (coronary artery disease)    Two RCA stents remotely / 3rd RCA stent 2006  . Colon polyps    s/p several Cscopes.  Marland Kitchen COPD (chronic obstructive pulmonary disease) (HCC)    on O2, nocturnal  . Diabetes mellitus    dx aprox 2009  . ED (erectile dysfunction)    has a vacumm device  . Ejection fraction   . Hyperlipidemia    dx in 90s  . Hypertension    dx in the 90  . Hypogonadism male   . PVD (peripheral vascular disease) ([place])    s/p stents at LE 2009, Dr Einar Gip  . Shortness of breath    O2 Sat  dropped to 82% walking on the treadmill, September, 2012    Past Surgical History:  Procedure Laterality Date  . APPENDECTOMY    . TONSILLECTOMY      Family History  Problem Relation Age of Onset  . Heart disease Father   . Hypertension Father   .  Stroke Father   . Diabetes Paternal Aunt   . Diabetes Maternal Grandmother   . Diabetes Other        GM, nephews, many family members  . Hyperlipidemia Other        ?  . Colon cancer Neg Hx   . Prostate cancer Neg Hx     Social History:  reports that he quit smoking about 39 years ago. His smoking use included Cigarettes. He has a 60.00 pack-year smoking history. He has never used smokeless tobacco. He reports that he drinks alcohol. He reports that he does not use drugs.    Review of Systems        Lipids: He has been treated with simvastatin 20 mg with good control       Lab Results  Component Value Date   CHOL 99 02/13/2017   HDL 35.50 (L) 02/13/2017   LDLCALC 47 02/13/2017   TRIG 84.0 02/13/2017   CHOLHDL 3 02/13/2017                      Has had erectile dysfunction for quite sometime.  Also had decreased libido previously and was found to have hypogonadism, probably in 2011. He did have a slightly low free testosterone level in 2012 on treatment Prolactin level normal   Symptoms are controlled with AndroGel 4 pumps without any fatigue  On his last visit his testosterone level was Normal He now has been Less compliant with the testosterone gel and his level is below 200, however he does not think he is having any new fatigue PSA normal  Lab Results  Component Value Date   TESTOSTERONE 162.24 (L) 03/29/2017   Lab Results  Component Value Date   PSA 0.56 04/02/2016   PSA 0.60 01/20/2014   PSA 0.59 12/11/2011         HYPERTENSION: This is followed by PCP and is on 100 mg of losartan along with Maxide, amlodipine and metoprolol  He was told to reduce his Maxzide to half a tablet with starting Jardiance but  he forgot to do so  Blood pressure apparently checked at home and he thinks it is about 130/80  BP Readings from Last 3 Encounters:  04/03/17 96/60  03/04/17 122/84  02/28/17 110/70   INCREASED creatinine: This is new and only since he was started on Jardiance as above  He has been about cough, has been prescribed new inhaler from pulmonologist  LABS:  Lab on 03/29/2017  Component Date Value Ref Range Status  . Sodium 03/29/2017 140  135 - 145 mEq/L Final  . Potassium 03/29/2017 3.7  3.5 - 5.1 mEq/L Final  . Chloride 03/29/2017 104  96 - 112 mEq/L Final  . CO2 03/29/2017 23  19 - 32 mEq/L Final  . Glucose, Bld 03/29/2017 207* 70 - 99 mg/dL Final  . BUN 03/29/2017 29* 6 - 23 mg/dL Final  . Creatinine, Ser 03/29/2017 1.62* 0.40 - 1.50 mg/dL Final  . Calcium 03/29/2017 9.5  8.4 - 10.5 mg/dL Final  . GFR 03/29/2017 53.11* >60.00 mL/min Final  . Fructosamine 03/29/2017 284  0 - 285 umol/L Final   Comment: Published reference interval for apparently healthy subjects between age 56 and 53 is 36 - 285 umol/L and in a poorly controlled diabetic population is 228 - 563 umol/L with a mean of 396 umol/L.   Marland Kitchen Testosterone 03/29/2017 162.24* 300.00 - 890.00 ng/dL Final    Physical Examination:  BP 96/60 (BP Location:  Right Arm, Cuff Size: Large)   Pulse 94   Ht 6\' 1"  (1.854 m)   Wt 244 lb 9.6 oz (110.9 kg)   SpO2 94%   BMI 32.27 kg/m   No ankle edema present  Standing blood pressure right side 96/60    ASSESSMENT/PLAN:   Diabetes type 2, with obesity See history of present illness for detailed discussion of current diabetes management, blood sugar patterns and problems identified  As discussed above his blood sugars are improving with adding Jardiance to his regimen along with metformin 1 g twice a day and Amaryl 4 mg and Trulicity His blood sugars are averaging below 140 and not fluctuating between morning and evening times although not clear why his lab glucose was 207  fasting Fructosamine is upper normal at 284 now He has no side effects from Jardiance and no lightheadedness However CREATININE is higher than usual and blood pressure is relatively low  Since his blood sugars are better with Jardiance he will need to continue this along with other medications unchanged, will hold for 3 days while getting his blood pressure back up  Discussed blood sugar targets at various times  HYPERTENSION: Blood pressure is markedly lower standing up He did not reduce his Maxzide as directed For now since his blood pressure has changed significantly he will reduce both his Maxzide and losartan to half tablets along with holding the Jardiance for 3 days now Needs follow-up in 1 month  Hypogonadism: He has not taken his AndroGel regularly and his level is below 200 although he is not surprisingly symptomatic He agrees to try and take the AndroGel daily Follow up on his next visit  Total visit time for evaluation and management of above problems, counseling, review of medications, labs = 25 minutes   Patient Instructions  Stop Jardiance for 3 days  Take 1/2 Maxzide and Losartan   Carrieann Spielberg 04/03/2017, 10:46 AM   Note: This office note was prepared with Estate agent. Any transcriptional errors that result from this process are unintentional.

## 2017-04-03 NOTE — Patient Instructions (Signed)
Stop Jardiance for 3 days  Take 1/2 Maxzide and Losartan

## 2017-04-05 ENCOUNTER — Other Ambulatory Visit: Payer: Self-pay | Admitting: Endocrinology

## 2017-04-08 ENCOUNTER — Telehealth: Payer: Self-pay | Admitting: Internal Medicine

## 2017-04-08 MED ORDER — BUDESONIDE-FORMOTEROL FUMARATE 160-4.5 MCG/ACT IN AERO: 2.0000 | INHALATION_SPRAY | Freq: Two times a day (BID) | RESPIRATORY_TRACT | 12 refills | Status: DC

## 2017-04-08 MED ORDER — BUDESONIDE-FORMOTEROL FUMARATE 160-4.5 MCG/ACT IN AERO: 2.0000 | INHALATION_SPRAY | Freq: Two times a day (BID) | RESPIRATORY_TRACT | 0 refills | Status: DC

## 2017-04-08 NOTE — Telephone Encounter (Signed)
Spoke with pt about CY's message. Pt is aware that a sample has been placed up front for pick up and [facility]. He had no additional questions at this time. Nothing further is needed

## 2017-04-08 NOTE — Telephone Encounter (Signed)
Yes- ok Sample Symbicort 160 and script # 1 , inhale 2puffs then rinse mouth, twice daily, refill x 12

## 2017-04-08 NOTE — Telephone Encounter (Signed)
Called patient but spoke with an unidentified woman. She stated that she will have the patient call back. Need to know if it is Symbicort 80 or 160 because neither are on his medication list nor mentioned on his last AVS with CY.

## 2017-04-08 NOTE — Telephone Encounter (Signed)
Pt returned call. Pt states he would like to go back to Symbicort 160 as this has helped his breathing the most out of all the inhalers he has tried. Pt is requesting a sample and an rx.   Dr. Annamaria Boots please advise if ok to give pt sample and rx of Symbicort. Thanks.   No Known Allergies  Current Outpatient Prescriptions on File Prior to Visit  Medication Sig Dispense Refill  . amLODipine (NORVASC) 10 MG tablet TAKE 1 TABLET DAILY 90 tablet 3  . aspirin 81 MG tablet Take 81 mg by mouth daily.      . empagliflozin (JARDIANCE) 10 MG TABS tablet Take 10 mg by mouth daily. 90 tablet 3  . glimepiride (AMARYL) 4 MG tablet Take 1 tablet (4 mg total) by mouth daily with breakfast.    . losartan (COZAAR) 100 MG tablet Take 1 tablet (100 mg total) by mouth daily. 90 tablet 2  . metFORMIN (GLUCOPHAGE) 1000 MG tablet TAKE 1 TABLET TWICE A DAY WITH MEALS 180 tablet 1  . metoprolol succinate (TOPROL XL) 100 MG 24 hr tablet Take 1 tablet (100 mg total) by mouth daily. 90 tablet 2  . Multiple Vitamin (MULTIVITAMIN WITH MINERALS) TABS tablet Take 1 tablet by mouth daily.    . simvastatin (ZOCOR) 20 MG tablet Take 1 tablet (20 mg total) by mouth at bedtime. (Patient taking differently: Take 20 mg by mouth daily. ) 90 tablet 0  . SPIRIVA HANDIHALER 18 MCG inhalation capsule INHALE THE CONTENTS OF 1 CAPSULE DAILY 90 capsule 3  . Testosterone 20.25 MG/ACT (1.62%) GEL Place 4 application onto the skin daily. 225 g 3  . triamterene-hydrochlorothiazide (MAXZIDE-25) 37.5-25 MG tablet Take 1 tablet by mouth daily. 90 tablet 2  . TRULICITY 1.5 GB/2.0FE SOPN INJECT 1.5 MG INTO SKIN WEEKLY 6 mL 1  . umeclidinium-vilanterol (ANORO ELLIPTA) 62.5-25 MCG/INH AEPB Inhale 1 puff into the lungs daily. 180 each 3  . [DISCONTINUED] sitaGLIPtan (JANUVIA) 100 MG tablet Take 1 tablet (100 mg total) by mouth daily. 90 tablet 1   No current facility-administered medications on file prior to visit.

## 2017-04-26 ENCOUNTER — Ambulatory Visit: Payer: Medicare Other | Admitting: Internal Medicine

## 2017-05-02 ENCOUNTER — Other Ambulatory Visit (INDEPENDENT_AMBULATORY_CARE_PROVIDER_SITE_OTHER): Payer: Medicare Other

## 2017-05-02 DIAGNOSIS — E1165 Type 2 diabetes mellitus with hyperglycemia: Secondary | ICD-10-CM

## 2017-05-02 LAB — BASIC METABOLIC PANEL
BUN: 17 mg/dL (ref 6–23)
CHLORIDE: 105 meq/L (ref 96–112)
CO2: 24 meq/L (ref 19–32)
Calcium: 9.5 mg/dL (ref 8.4–10.5)
Creatinine, Ser: 1.42 mg/dL (ref 0.40–1.50)
GFR: 61.82 mL/min (ref >60.00)
GLUCOSE: 159 mg/dL — AB (ref 70–99)
POTASSIUM: 3.5 meq/L (ref 3.5–5.1)
Sodium: 138 mEq/L (ref 135–145)

## 2017-05-05 NOTE — Progress Notes (Signed)
Patient ID: Jonathan Leather., male   DOB: [birth year], 81 y.o.   MRN: [id number]    Reason for Appointment:  Follow-up  History of Present Illness:          Diagnosis: Type 2 diabetes mellitus, date of diagnosis:  2009      Past history: He is not clear what his diabetes was diagnosed but according to hospital records it may have been in 2009. Most likely he was placed on metformin initially and at some point Amaryl added. In 2013 it was also given Januvia presumably to improve his control. However appears that his A1c has been consistently over 7% Janumet was started instead of metformin and Januvia separately in 2013  He was started on Trulicity in 9/52 instead of Victoza because excessive bruising on his abdomen  Later in 01/2015 he was switched to Bydureon because of insurance noncoverage of his Trulicity  Recent history:   Non-insulin hypoglycemic drugs the patient is taking are: Metformin 1000 twice a day, Amaryl 4 mg  in the morning, Trulicity 1.5 mg weekly, Jardiance 10 mg daily  His A1c has been progressively higher at 7.9 in June, previously as low as 7.4  Current blood sugar patterns and problems:  He has had better blood sugars with taking Jardiance 10 mgdaily, previously on Invokana  Since his creatinine was higher on the last visit his blood pressure medications were adjusted and he is back on Jardance now  He has done very well with monitoring his blood sugars at home although mostly in the morning  His blood sugars are excellent throughout the day except occasional high normal or slightly high readings after lunch and dinner  He is now trying to exercise at home on his equipment instead of going to the gym  His weight is overall stable    Side effects from medications have been: None  Glucose monitoring:  done  Once a day         Glucometer:  FreeStyle Blood Glucose readings  by download as   Mean values apply above for all meters except median for  One Touch  PRE-MEAL Fasting Lunch Dinner Bedtime Overall  Glucose range:       Mean/median: 128    132   POST-MEAL PC Breakfast PC Lunch PC Dinner  Glucose range:  112-73 135-202  Mean/median:        Self-care:      Meals: 3 meals per day. eating egg/meat breading for breakfast, dinner 5-6    Exercise: Going 3-4/7 with cardio around 50 minutes at [place] visit: Most recent: 4/16 .               Weight history:  Wt Readings from Last 3 Encounters:  05/06/17 246 lb 9.6 oz (111.9 kg)  04/03/17 244 lb 9.6 oz (110.9 kg)  03/04/17 247 lb 3.2 oz (112.1 kg)   Glycemic control:  Lab Results  Component Value Date   HGBA1C 7.9 (H) 02/28/2017   HGBA1C 7.7 (H) 10/08/2016   HGBA1C 7.4 (H) 07/09/2016   Lab Results  Component Value Date   MICROALBUR <0.7 02/28/2017   LDLCALC 47 02/13/2017   CREATININE 1.42 05/02/2017    Lab Results  Component Value Date   FRUCTOSAMINE 284 03/29/2017   FRUCTOSAMINE 268 12/06/2016   FRUCTOSAMINE 282 02/22/2014    OTHER active problems: See review of systems  Lab on 05/02/2017  Component Date Value Ref Range Status  . Sodium 05/02/2017 138  135 - 145 mEq/L Final  . Potassium 05/02/2017 3.5  3.5 - 5.1 mEq/L Final  . Chloride 05/02/2017 105  96 - 112 mEq/L Final  . CO2 05/02/2017 24  19 - 32 mEq/L Final  . Glucose, Bld 05/02/2017 159* 70 - 99 mg/dL Final  . BUN 05/02/2017 17  6 - 23 mg/dL Final  . Creatinine, Ser 05/02/2017 1.42  0.40 - 1.50 mg/dL Final  . Calcium 05/02/2017 9.5  8.4 - 10.5 mg/dL Final  . GFR 05/02/2017 61.82  >60.00 mL/min Final     Allergies as of 05/06/2017   No Known Allergies     Medication List       Accurate as of 05/06/17  9:59 AM. Always use your most recent med list.          amLODipine 10 MG tablet Commonly known as:  NORVASC TAKE 1 TABLET DAILY   aspirin 81 MG tablet Take 81 mg by mouth daily.   budesonide-formoterol 160-4.5 MCG/ACT inhaler Commonly known as:  SYMBICORT Inhale 2 puffs  into the lungs 2 (two) times daily.   empagliflozin 10 MG Tabs tablet Commonly known as:  JARDIANCE Take 10 mg by mouth daily.   glimepiride 4 MG tablet Commonly known as:  AMARYL Take 1 tablet (4 mg total) by mouth daily with breakfast.   losartan 100 MG tablet Commonly known as:  COZAAR Take 1 tablet (100 mg total) by mouth daily.   metFORMIN 1000 MG tablet Commonly known as:  GLUCOPHAGE TAKE 1 TABLET TWICE A DAY WITH MEALS   metoprolol succinate 100 MG 24 hr tablet Commonly known as:  TOPROL XL Take 1 tablet (100 mg total) by mouth daily.   multivitamin with minerals Tabs tablet Take 1 tablet by mouth daily.   simvastatin 20 MG tablet Commonly known as:  ZOCOR Take 1 tablet (20 mg total) by mouth at bedtime.   SPIRIVA HANDIHALER 18 MCG inhalation capsule Generic drug:  tiotropium INHALE THE CONTENTS OF 1 CAPSULE DAILY   Testosterone 20.25 MG/ACT (1.62%) Gel Place 4 application onto the skin daily.   triamterene-hydrochlorothiazide 37.5-25 MG tablet Commonly known as:  MAXZIDE-25 Take 1 tablet by mouth daily.   TRULICITY 1.5 GQ/6.7YP Sopn Generic drug:  Dulaglutide INJECT 1.5 MG INTO SKIN WEEKLY       Allergies: No Known Allergies  Past Medical History:  Diagnosis Date  . CAD (coronary artery disease)    Two RCA stents remotely / 3rd RCA stent 2006  . Colon polyps    s/p several Cscopes.  Marland Kitchen COPD (chronic obstructive pulmonary disease) (HCC)    on O2, nocturnal  . Diabetes mellitus    dx aprox 2009  . ED (erectile dysfunction)    has a vacumm device  . Ejection fraction   . Hyperlipidemia    dx in 90s  . Hypertension    dx in the 90  . Hypogonadism male   . PVD (peripheral vascular disease) ([place])    s/p stents at LE 2009, Dr Einar Gip  . Shortness of breath    O2 Sat dropped to 82% walking on the treadmill, September, 2012    Past Surgical History:  Procedure Laterality Date  . APPENDECTOMY    . TONSILLECTOMY      Family History  Problem  Relation Age of Onset  . Heart disease Father   . Hypertension Father   . Stroke Father   . Diabetes Paternal Aunt   . Diabetes Maternal Grandmother   . Diabetes Other  GM, nephews, many family members  . Hyperlipidemia Other        ?  . Colon cancer Neg Hx   . Prostate cancer Neg Hx     Social History:  reports that he quit smoking about 39 years ago. His smoking use included Cigarettes. He has a 60.00 pack-year smoking history. He has never used smokeless tobacco. He reports that he drinks alcohol. He reports that he does not use drugs.    Review of Systems        Lipids: He has been treated with simvastatin 20 mg with good control       Lab Results  Component Value Date   CHOL 99 02/13/2017   HDL 35.50 (L) 02/13/2017   LDLCALC 47 02/13/2017   TRIG 84.0 02/13/2017   CHOLHDL 3 02/13/2017                      Has had erectile dysfunction for quite sometime.  Also had decreased libido previously and was found to have hypogonadism, probably in 2011. He did have a slightly low free testosterone level in 2012 on treatment Prolactin level normal   Symptoms are controlled with AndroGel 4 pumps without any fatigue  On his last visit his testosterone level was low Despite reminders he is not compliant with the testosterone gel and his level was below 200 PSA normal  Lab Results  Component Value Date   TESTOSTERONE 162.24 (L) 03/29/2017   Lab Results  Component Value Date   PSA 0.56 04/02/2016   PSA 0.60 01/20/2014   PSA 0.59 12/11/2011         HYPERTENSION: This is followed by PCP and is on 0mg  of losartan along with half Maxide, amlodipine and metoprolol  He was told to reduce his Maxzide to half a tablet with Jardiance  Blood pressure checked at home and he thinks it is about 130/75-80 He has not taken his medications as yet  BP Readings from Last 3 Encounters:  05/06/17 130/84  04/03/17 96/60  03/04/17 122/84    INCREASED creatinine: This is  improved cutting back on his antihypertensives as above   LABS:  Lab on 05/02/2017  Component Date Value Ref Range Status  . Sodium 05/02/2017 138  135 - 145 mEq/L Final  . Potassium 05/02/2017 3.5  3.5 - 5.1 mEq/L Final  . Chloride 05/02/2017 105  96 - 112 mEq/L Final  . CO2 05/02/2017 24  19 - 32 mEq/L Final  . Glucose, Bld 05/02/2017 159* 70 - 99 mg/dL Final  . BUN 05/02/2017 17  6 - 23 mg/dL Final  . Creatinine, Ser 05/02/2017 1.42  0.40 - 1.50 mg/dL Final  . Calcium 05/02/2017 9.5  8.4 - 10.5 mg/dL Final  . GFR 05/02/2017 61.82  >60.00 mL/min Final    Physical Examination:  BP 130/84   Pulse 86   Ht 6\' 1"  (1.854 m)   Wt 246 lb 9.6 oz (111.9 kg)   SpO2 95%   BMI 32.53 kg/m   No ankle edema present Repeat blood pressure 130/82, large cuff    ASSESSMENT/PLAN:   Diabetes type 2, with obesity See history of present illness for discussion of current diabetes management, blood sugar patterns and problems identified  His blood sugars are excellent now with his multidrug regimen including Jardiance Has only a few high normal readings at various times Does need to check readings after meals more often  Recommend continuing Jardiance even though creatinine is high  normal, he is a good candidate for this medication with history of CAD  HYPERTENSION: Blood pressure is back to normal with reducing his Maxzide and losartan Blood pressure has improved and is more sensitive to medications because of taking Jardiance now  Hypogonadism: He has not taken his AndroGel regularly  Since he has a large supply at home he wants to try this before changing to another preparation He thinks he can try and take it daily in the morning from now on   There are no Patient Instructions on file for this visit.  Tessy Pawelski 05/06/2017, 9:59 AM   Note: This office note was prepared with Dragon voice recognition system technology. Any transcriptional errors that result from this process are  unintentional.

## 2017-05-06 ENCOUNTER — Encounter: Payer: Self-pay | Admitting: Endocrinology

## 2017-05-06 ENCOUNTER — Ambulatory Visit (INDEPENDENT_AMBULATORY_CARE_PROVIDER_SITE_OTHER): Payer: Medicare Other | Admitting: Endocrinology

## 2017-05-06 VITALS — BP 130/84 | HR 86 | Ht 73.0 in | Wt 246.6 lb

## 2017-05-06 DIAGNOSIS — E1165 Type 2 diabetes mellitus with hyperglycemia: Secondary | ICD-10-CM

## 2017-05-06 DIAGNOSIS — I2510 Atherosclerotic heart disease of native coronary artery without angina pectoris: Secondary | ICD-10-CM

## 2017-05-06 NOTE — Patient Instructions (Signed)
Check blood sugars on waking up 3/7   Also check blood sugars about 2 hours after a meal and do this after different meals by rotation  Recommended blood sugar levels on waking up is 90-130 and about 2 hours after meal is 130-160  Please bring your blood sugar monitor to each visit, thank you  

## 2017-05-08 ENCOUNTER — Ambulatory Visit: Payer: Medicare Other | Admitting: *Deleted

## 2017-05-08 NOTE — Progress Notes (Deleted)
Subjective:   Jonathan [name]. is a 81 y.o. male who presents for Medicare Annual/Subsequent preventive examination.  Review of Systems:  No ROS.  Medicare Wellness Visit. Additional risk factors are reflected in the social history.    Sleep patterns:  Home Safety/Smoke Alarms: Feels safe in home. Smoke alarms in place.  Living environment; residence and Firearm Safety:  [place] Safety/Bike Helmet: Wears seat belt.   Male:   CCS-  Last 12/06/14. PSA-  Lab Results  Component Value Date   PSA 0.56 04/02/2016   PSA 0.60 01/20/2014   PSA 0.59 12/11/2011       Objective:    Vitals: There were no vitals taken for this visit.  There is no height or weight on file to calculate BMI.  Tobacco History  Smoking Status  . Former Smoker  . Packs/day: 2.00  . Years: 30.00  . Types: Cigarettes  . Quit date: 06/17/1977  Smokeless Tobacco  . Never Used    Comment: 2 ppd, quit 1987     Counseling given: Not Answered   Past Medical History:  Diagnosis Date  . CAD (coronary artery disease)    Two RCA stents remotely / 3rd RCA stent 2006  . Colon polyps    s/p several Cscopes.  Marland Kitchen COPD (chronic obstructive pulmonary disease) (HCC)    on O2, nocturnal  . Diabetes mellitus    dx aprox 2009  . ED (erectile dysfunction)    has a vacumm device  . Ejection fraction   . Hyperlipidemia    dx in 90s  . Hypertension    dx in the 90  . Hypogonadism male   . PVD (peripheral vascular disease) ([place])    s/p stents at LE 2009, Dr Einar Gip  . Shortness of breath    O2 Sat dropped to 82% walking on the treadmill, September, 2012   Past Surgical History:  Procedure Laterality Date  . APPENDECTOMY    . TONSILLECTOMY     Family History  Problem Relation Age of Onset  . Heart disease Father   . Hypertension Father   . Stroke Father   . Diabetes Paternal Aunt   . Diabetes Maternal Grandmother   . Diabetes Other        GM, nephews, many family members  . Hyperlipidemia Other        ?  . Colon cancer Neg Hx   . Prostate cancer Neg Hx    History  Sexual Activity  . Sexual activity: Not on file    Outpatient Encounter Prescriptions as of 05/08/2017  Medication Sig  . amLODipine (NORVASC) 10 MG tablet TAKE 1 TABLET DAILY  . aspirin 81 MG tablet Take 81 mg by mouth daily.    . budesonide-formoterol (SYMBICORT) 160-4.5 MCG/ACT inhaler Inhale 2 puffs into the lungs 2 (two) times daily.  . empagliflozin (JARDIANCE) 10 MG TABS tablet Take 10 mg by mouth daily.  Marland Kitchen glimepiride (AMARYL) 4 MG tablet Take 1 tablet (4 mg total) by mouth daily with breakfast.  . losartan (COZAAR) 100 MG tablet Take 1 tablet (100 mg total) by mouth daily.  . metFORMIN (GLUCOPHAGE) 1000 MG tablet TAKE 1 TABLET TWICE A DAY WITH MEALS  . metoprolol succinate (TOPROL XL) 100 MG 24 hr tablet Take 1 tablet (100 mg total) by mouth daily.  . Multiple Vitamin (MULTIVITAMIN WITH MINERALS) TABS tablet Take 1 tablet by mouth daily.  . simvastatin (ZOCOR) 20 MG tablet Take 1 tablet (20 mg total) by  mouth at bedtime. (Patient taking differently: Take 20 mg by mouth daily. )  . SPIRIVA HANDIHALER 18 MCG inhalation capsule INHALE THE CONTENTS OF 1 CAPSULE DAILY  . Testosterone 20.25 MG/ACT (1.62%) GEL Place 4 application onto the skin daily.  Marland Kitchen triamterene-hydrochlorothiazide (MAXZIDE-25) 37.5-25 MG tablet Take 1 tablet by mouth daily.  . TRULICITY 1.5 AO/1.3YQ SOPN INJECT 1.5 MG INTO SKIN WEEKLY   No facility-administered encounter medications on file as of 05/08/2017.     Activities of Daily Living In your present state of health, do you have any difficulty performing the following activities: 10/08/2016  Hearing? N  Vision? N  Difficulty concentrating or making decisions? N  Walking or climbing stairs? N  Dressing or bathing? N  Doing errands, shopping? N  Some recent data might be hidden    Patient Care Team: Colon Branch, MD as PCP - General Elayne Snare, MD as Consulting Physician  (Endocrinology) Deneise Lever, MD as Consulting Physician (Pulmonary Disease) Clent Jacks, MD as Consulting Physician (Ophthalmology) Camelia Phenes, DPM as Consulting Physician (Podiatry) Dorothy Spark, MD as Consulting Physician (Cardiology) Carolan Clines, MD as Consulting Physician (Urology)   Assessment:    Physical assessment deferred to PCP.  Exercise Activities and Dietary recommendations   Diet (meal preparation, eat out, water intake, caffeinated beverages, dairy products, fruits and vegetables): {Desc; diets:16563} Breakfast: Lunch:  Dinner:      Goals    None     Fall Risk Fall Risk  10/08/2016 04/02/2016 12/30/2014 01/18/2014 01/06/2013  Falls in the past year? No No No No No   Depression Screen PHQ 2/9 Scores 10/08/2016 04/02/2016 12/30/2014 01/18/2014  PHQ - 2 Score 0 0 0 0    Cognitive Function        Immunization History  Administered Date(s) Administered  . Influenza Split 06/18/2012  . Influenza Whole 05/02/2010, 06/18/2011  . Influenza, High Dose Seasonal PF 06/19/2016  . Influenza,inj,Quad PF,6+ Mos 06/04/2013, 06/10/2014, 07/06/2015  . Pneumococcal Conjugate-13 01/18/2014  . Pneumococcal Polysaccharide-23 06/21/2010  . Td 09/17/2004  . Tdap 11/18/2014   Screening Tests Health Maintenance  Topic Date Due  . OPHTHALMOLOGY EXAM  01/10/2017  . INFLUENZA VACCINE  04/17/2017  . HEMOGLOBIN A1C  08/30/2017  . FOOT EXAM  10/08/2017  . COLONOSCOPY  12/05/2021  . TETANUS/TDAP  11/17/2024  . PNA vac Low Risk Adult  Completed      Plan:   ***  I have personally reviewed and noted the following in the patient's chart:   . Medical and social history . Use of alcohol, tobacco or illicit drugs  . Current medications and supplements . Functional ability and status . Nutritional status . Physical activity . Advanced directives . List of other physicians . Hospitalizations, surgeries, and ER visits in previous 12  months . Vitals . Screenings to include cognitive, depression, and falls . Referrals and appointments  In addition, I have reviewed and discussed with patient certain preventive protocols, quality metrics, and best practice recommendations. A written personalized care plan for preventive services as well as general preventive health recommendations were provided to patient.     Naaman Plummer [place], [place]  05/08/2017

## 2017-05-31 NOTE — Progress Notes (Addendum)
Subjective:   Jonathan [name]. is a 81 y.o. male who presents for Medicare Annual/Subsequent preventive examination.  Review of Systems:  No ROS.  Medicare Wellness Visit. Additional risk factors are reflected in the social history.    Sleep patterns: wakes once to urinate. Sleeps about 5 hrs. Feels rested. Naps daily.  Home Safety/Smoke Alarms: Feels safe in home. Smoke alarms in place.  Living environment; residence and Firearm Safety: 2 story home. No issue with stairs. Lives with wife. Seat Belt Safety/Bike Helmet: Wears seat belt.  Male:   CCS-  Last 12/06/14   PSA-  Lab Results  Component Value Date   PSA 0.56 04/02/2016   PSA 0.60 01/20/2014   PSA 0.59 12/11/2011       Objective:    Vitals: There were no vitals taken for this visit.  There is no height or weight on file to calculate BMI.  Tobacco History  Smoking Status  . Former Smoker  . Packs/day: 2.00  . Years: 30.00  . Types: Cigarettes  . Quit date: 06/17/1977  Smokeless Tobacco  . Never Used    Comment: 2 ppd, quit 1987     Counseling given: Not Answered   Past Medical History:  Diagnosis Date  . CAD (coronary artery disease)    Two RCA stents remotely / 3rd RCA stent 2006  . Colon polyps    s/p several Cscopes.  Marland Kitchen COPD (chronic obstructive pulmonary disease) (HCC)    on O2, nocturnal  . Diabetes mellitus    dx aprox 2009  . ED (erectile dysfunction)    has a vacumm device  . Ejection fraction   . Hyperlipidemia    dx in 90s  . Hypertension    dx in the 90  . Hypogonadism male   . PVD (peripheral vascular disease) ([place])    s/p stents at LE 2009, Dr Einar Gip  . Shortness of breath    O2 Sat dropped to 82% walking on the treadmill, September, 2012   Past Surgical History:  Procedure Laterality Date  . APPENDECTOMY    . TONSILLECTOMY     Family History  Problem Relation Age of Onset  . Heart disease Father   . Hypertension Father   . Stroke Father   . Diabetes Paternal Aunt   .  Diabetes Maternal Grandmother   . Diabetes Other        GM, nephews, many family members  . Hyperlipidemia Other        ?  . Colon cancer Neg Hx   . Prostate cancer Neg Hx    History  Sexual Activity  . Sexual activity: Not on file    Outpatient Encounter Prescriptions as of 06/05/2017  Medication Sig  . amLODipine (NORVASC) 10 MG tablet TAKE 1 TABLET DAILY  . aspirin 81 MG tablet Take 81 mg by mouth daily.    . budesonide-formoterol (SYMBICORT) 160-4.5 MCG/ACT inhaler Inhale 2 puffs into the lungs 2 (two) times daily.  . empagliflozin (JARDIANCE) 10 MG TABS tablet Take 10 mg by mouth daily.  Marland Kitchen glimepiride (AMARYL) 4 MG tablet Take 1 tablet (4 mg total) by mouth daily with breakfast.  . losartan (COZAAR) 100 MG tablet Take 1 tablet (100 mg total) by mouth daily.  . metFORMIN (GLUCOPHAGE) 1000 MG tablet TAKE 1 TABLET TWICE A DAY WITH MEALS  . metoprolol succinate (TOPROL XL) 100 MG 24 hr tablet Take 1 tablet (100 mg total) by mouth daily.  . Multiple Vitamin (MULTIVITAMIN WITH  MINERALS) TABS tablet Take 1 tablet by mouth daily.  . simvastatin (ZOCOR) 20 MG tablet Take 1 tablet (20 mg total) by mouth at bedtime. (Patient taking differently: Take 20 mg by mouth daily. )  . SPIRIVA HANDIHALER 18 MCG inhalation capsule INHALE THE CONTENTS OF 1 CAPSULE DAILY  . Testosterone 20.25 MG/ACT (1.62%) GEL Place 4 application onto the skin daily.  Marland Kitchen triamterene-hydrochlorothiazide (MAXZIDE-25) 37.5-25 MG tablet Take 1 tablet by mouth daily.  . TRULICITY 1.5 JM/4.2AS SOPN INJECT 1.5 MG INTO SKIN WEEKLY   No facility-administered encounter medications on file as of 06/05/2017.     Activities of Daily Living In your present state of health, do you have any difficulty performing the following activities: 10/08/2016  Hearing? N  Vision? N  Difficulty concentrating or making decisions? N  Walking or climbing stairs? N  Dressing or bathing? N  Doing errands, shopping? N  Some recent data might be  hidden    Patient Care Team: Colon Branch, MD as PCP - General Elayne Snare, MD as Consulting Physician (Endocrinology) Deneise Lever, MD as Consulting Physician (Pulmonary Disease) Clent Jacks, MD as Consulting Physician (Ophthalmology) Camelia Phenes, DPM as Consulting Physician (Podiatry) Dorothy Spark, MD as Consulting Physician (Cardiology) Carolan Clines, MD as Consulting Physician (Urology)   Assessment:    Physical assessment deferred to PCP.  Exercise Activities and Dietary recommendations   Diet (meal preparation, eat out, water intake, caffeinated beverages, dairy products, fruits and vegetables): well balanced Breakfast: cereal Lunch: snack Dinner:   Varies meat and veg. Drinks 3-4 glasses of water per day.   Goals    None     Fall Risk Fall Risk  10/08/2016 04/02/2016 12/30/2014 01/18/2014 01/06/2013  Falls in the past year? No No No No No   Depression Screen PHQ 2/9 Scores 10/08/2016 04/02/2016 12/30/2014 01/18/2014  PHQ - 2 Score 0 0 0 0    Cognitive Function Ad8 score reviewed for issues:  Issues making decisions:NO  Less interest in hobbies / activities:NO  Repeats questions, stories (family complaining):NO  Trouble using ordinary gadgets (microwave, computer, phone):NO  Forgets the month or year: NO  Mismanaging finances: NO  Remembering appts:NO  Daily problems with thinking and/or memory:NO Ad8 score is=0        Immunization History  Administered Date(s) Administered  . Influenza Split 06/18/2012  . Influenza Whole 05/02/2010, 06/18/2011  . Influenza, High Dose Seasonal PF 06/19/2016  . Influenza,inj,Quad PF,6+ Mos 06/04/2013, 06/10/2014, 07/06/2015  . Pneumococcal Conjugate-13 01/18/2014  . Pneumococcal Polysaccharide-23 06/21/2010  . Td 09/17/2004  . Tdap 11/18/2014   Screening Tests Health Maintenance  Topic Date Due  . OPHTHALMOLOGY EXAM  01/10/2017  . INFLUENZA VACCINE  04/17/2017  . HEMOGLOBIN A1C  08/30/2017  .  FOOT EXAM  10/08/2017  . COLONOSCOPY  12/05/2021  . TETANUS/TDAP  11/17/2024  . PNA vac Low Risk Adult  Completed      Plan:   Follow up with Dr.Paz as scheduled 08/15/17.   Continue to eat heart healthy diet (full of fruits, vegetables, whole grains, lean protein, water--limit salt, fat, and sugar intake) and increase physical activity as tolerated.  Continue doing brain stimulating activities (puzzles, reading, adult coloring books, staying active) to keep memory sharp.   You received your flu vaccine today.  I have personally reviewed and noted the following in the patient's chart:   . Medical and social history . Use of alcohol, tobacco or illicit drugs  . Current medications and supplements .  Functional ability and status . Nutritional status . Physical activity . Advanced directives . List of other physicians . Hospitalizations, surgeries, and ER visits in previous 12 months . Vitals . Screenings to include cognitive, depression, and falls . Referrals and appointments  In addition, I have reviewed and discussed with patient certain preventive protocols, quality metrics, and best practice recommendations. A written personalized care plan for preventive services as well as general preventive health recommendations were provided to patient.     Naaman Plummer [place], [place]  05/31/2017  Kathlene November, MD

## 2017-06-05 ENCOUNTER — Encounter: Payer: Self-pay | Admitting: *Deleted

## 2017-06-05 ENCOUNTER — Ambulatory Visit (INDEPENDENT_AMBULATORY_CARE_PROVIDER_SITE_OTHER): Payer: Medicare Other | Admitting: *Deleted

## 2017-06-05 VITALS — BP 126/72 | HR 81 | Ht 73.0 in | Wt 248.2 lb

## 2017-06-05 DIAGNOSIS — Z23 Encounter for immunization: Secondary | ICD-10-CM | POA: Diagnosis not present

## 2017-06-05 DIAGNOSIS — Z0000 Encounter for general adult medical examination without abnormal findings: Secondary | ICD-10-CM

## 2017-06-05 NOTE — Patient Instructions (Signed)
Jonathan [name] , Thank you for taking time to come for your Medicare Wellness Visit. I appreciate your ongoing commitment to your health goals. Please review the following plan we discussed and let me know if I can assist you in the future.   These are the goals we discussed: Goals    None      This is a list of the screening recommended for you and due dates:  Health Maintenance  Topic Date Due  . Eye exam for diabetics  01/10/2017  . Flu Shot  04/17/2017  . Hemoglobin A1C  08/30/2017  . Complete foot exam   10/08/2017  . Colon Cancer Screening  12/05/2021  . Tetanus Vaccine  11/17/2024  . Pneumonia vaccines  Completed    Follow up with Dr.Paz as scheduled 08/15/17.   Continue to eat heart healthy diet (full of fruits, vegetables, whole grains, lean protein, water--limit salt, fat, and sugar intake) and increase physical activity as tolerated.  Continue doing brain stimulating activities (puzzles, reading, adult coloring books, staying active) to keep memory sharp.   You received your flu vaccine today.   Health Maintenance, Male A healthy lifestyle and preventive care is important for your health and wellness. Ask your health care provider about what schedule of regular examinations is right for you. What should I know about weight and diet? Eat a Healthy Diet  Eat plenty of vegetables, fruits, whole grains, low-fat dairy products, and lean protein.  Do not eat a lot of foods high in solid fats, added sugars, or salt.  Maintain a Healthy Weight Regular exercise can help you achieve or maintain a healthy weight. You should:  Do at least 150 minutes of exercise each week. The exercise should increase your heart rate and make you sweat (moderate-intensity exercise).  Do strength-training exercises at least twice a week.  Watch Your Levels of Cholesterol and Blood Lipids  Have your blood tested for lipids and cholesterol every 5 years starting at 79 years of age. If you are  at high risk for heart disease, you should start having your blood tested when you are 79 years old. You may need to have your cholesterol levels checked more often if: ? Your lipid or cholesterol levels are high. ? You are older than 79 years of age. ? You are at high risk for heart disease.  What should I know about cancer screening? Many types of cancers can be detected early and may often be prevented. Lung Cancer  You should be screened every year for lung cancer if: ? You are a current smoker who has smoked for at least 30 years. ? You are a former smoker who has quit within the past 15 years.  Talk to your health care provider about your screening options, when you should start screening, and how often you should be screened.  Colorectal Cancer  Routine colorectal cancer screening usually begins at 79 years of age and should be repeated every 5-10 years until you are 79 years old. You may need to be screened more often if early forms of precancerous polyps or small growths are found. Your health care provider may recommend screening at an earlier age if you have risk factors for colon cancer.  Your health care provider may recommend using home test kits to check for hidden blood in the stool.  A small camera at the end of a tube can be used to examine your colon (sigmoidoscopy or colonoscopy). This checks for the earliest forms  of colorectal cancer.  Prostate and Testicular Cancer  Depending on your age and overall health, your health care provider may do certain tests to screen for prostate and testicular cancer.  Talk to your health care provider about any symptoms or concerns you have about testicular or prostate cancer.  Skin Cancer  Check your skin from head to toe regularly.  Tell your health care provider about any new moles or changes in moles, especially if: ? There is a change in a mole's size, shape, or color. ? You have a mole that is larger than a pencil  eraser.  Always use sunscreen. Apply sunscreen liberally and repeat throughout the day.  Protect yourself by wearing long sleeves, pants, a wide-brimmed hat, and sunglasses when outside.  What should I know about heart disease, diabetes, and high blood pressure?  If you are 33-71 years of age, have your blood pressure checked every 3-5 years. If you are 10 years of age or older, have your blood pressure checked every year. You should have your blood pressure measured twice-once when you are at a hospital or clinic, and once when you are not at a hospital or clinic. Record the average of the two measurements. To check your blood pressure when you are not at a hospital or clinic, you can use: ? An automated blood pressure machine at a pharmacy. ? A home blood pressure monitor.  Talk to your health care provider about your target blood pressure.  If you are between 76-98 years old, ask your health care provider if you should take aspirin to prevent heart disease.  Have regular diabetes screenings by checking your fasting blood sugar level. ? If you are at a normal weight and have a low risk for diabetes, have this test once every three years after the age of 6. ? If you are overweight and have a high risk for diabetes, consider being tested at a younger age or more often.  A one-time screening for abdominal aortic aneurysm (AAA) by ultrasound is recommended for men aged 36-75 years who are current or former smokers. What should I know about preventing infection? Hepatitis B If you have a higher risk for hepatitis B, you should be screened for this virus. Talk with your health care provider to find out if you are at risk for hepatitis B infection. Hepatitis C Blood testing is recommended for:  Everyone born from [birth year] through 1963.  Anyone with known risk factors for hepatitis C.  Sexually Transmitted Diseases (STDs)  You should be screened each year for STDs including gonorrhea and  chlamydia if: ? You are sexually active and are younger than 79 years of age. ? You are older than 79 years of age and your health care provider tells you that you are at risk for this type of infection. ? Your sexual activity has changed since you were last screened and you are at an increased risk for chlamydia or gonorrhea. Ask your health care provider if you are at risk.  Talk with your health care provider about whether you are at high risk of being infected with HIV. Your health care provider may recommend a prescription medicine to help prevent HIV infection.  What else can I do?  Schedule regular health, dental, and eye exams.  Stay current with your vaccines (immunizations).  Do not use any tobacco products, such as cigarettes, chewing tobacco, and e-cigarettes. If you need help quitting, ask your health care provider.  Limit alcohol intake to  no more than 2 drinks per day. One drink equals 12 ounces of beer, 5 ounces of wine, or 1 ounces of hard liquor.  Do not use street drugs.  Do not share needles.  Ask your health care provider for help if you need support or information about quitting drugs.  Tell your health care provider if you often feel depressed.  Tell your health care provider if you have ever been abused or do not feel safe at home. This information is not intended to replace advice given to you by your health care provider. Make sure you discuss any questions you have with your health care provider. Document Released: 03/01/2008 Document Revised: 05/02/2016 Document Reviewed: 06/07/2015 Elsevier Interactive Patient Education  [employer].

## 2017-06-07 ENCOUNTER — Ambulatory Visit: Payer: Medicare Other | Admitting: Internal Medicine

## 2017-06-08 ENCOUNTER — Other Ambulatory Visit: Payer: Self-pay | Admitting: Internal Medicine

## 2017-07-02 ENCOUNTER — Ambulatory Visit (INDEPENDENT_AMBULATORY_CARE_PROVIDER_SITE_OTHER): Payer: Medicare Other | Admitting: Internal Medicine

## 2017-07-02 ENCOUNTER — Encounter: Payer: Self-pay | Admitting: Internal Medicine

## 2017-07-02 ENCOUNTER — Other Ambulatory Visit (INDEPENDENT_AMBULATORY_CARE_PROVIDER_SITE_OTHER): Payer: Medicare Other

## 2017-07-02 DIAGNOSIS — E1165 Type 2 diabetes mellitus with hyperglycemia: Secondary | ICD-10-CM

## 2017-07-02 DIAGNOSIS — J9611 Chronic respiratory failure with hypoxia: Secondary | ICD-10-CM

## 2017-07-02 DIAGNOSIS — J449 Chronic obstructive pulmonary disease, unspecified: Secondary | ICD-10-CM

## 2017-07-02 DIAGNOSIS — I2510 Atherosclerotic heart disease of native coronary artery without angina pectoris: Secondary | ICD-10-CM

## 2017-07-02 LAB — COMPREHENSIVE METABOLIC PANEL
ALT: 28 U/L (ref 0–53)
AST: 23 U/L (ref 0–37)
Albumin: 3.8 g/dL (ref 3.5–5.2)
Alkaline Phosphatase: 69 U/L (ref 39–117)
BUN: 20 mg/dL (ref 6–23)
CHLORIDE: 105 meq/L (ref 96–112)
CO2: 21 meq/L (ref 19–32)
Calcium: 9.3 mg/dL (ref 8.4–10.5)
Creatinine, Ser: 1.49 mg/dL (ref 0.40–1.50)
GFR: 58.46 mL/min — AB (ref >60.00)
GLUCOSE: 125 mg/dL — AB (ref 70–99)
POTASSIUM: 3.9 meq/L (ref 3.5–5.1)
Sodium: 139 mEq/L (ref 135–145)
Total Bilirubin: 0.4 mg/dL (ref 0.2–1.2)
Total Protein: 6.8 g/dL (ref 6.0–8.3)

## 2017-07-02 LAB — HEMOGLOBIN A1C: HEMOGLOBIN A1C: 6.9 % — AB (ref 4.6–6.5)

## 2017-07-02 MED ORDER — GLYCOPYRROLATE-FORMOTEROL 9-4.8 MCG/ACT IN AERO: 2.0000 | INHALATION_SPRAY | RESPIRATORY_TRACT | 0 refills | Status: AC

## 2017-07-02 MED ORDER — GLYCOPYRROLATE-FORMOTEROL 9-4.8 MCG/ACT IN AERO: 2.0000 | INHALATION_SPRAY | Freq: Two times a day (BID) | RESPIRATORY_TRACT | 12 refills | Status: DC

## 2017-07-02 NOTE — Assessment & Plan Note (Signed)
We discussed available medications. Plan-try sample Bevespi for comparison with Symbicort plus Spiriva.

## 2017-07-02 NOTE — Assessment & Plan Note (Signed)
DME is sending forms-not clear what is needed.

## 2017-07-02 NOTE — Progress Notes (Signed)
HPI male former smoker followed for COPD, chronic respiratory failure with hypoxia, complicated by CAD/MI/ Stents/ PAD, DM2 PFT- 07/10/11-  Normal spirometry flows with small- airway response to bronchodilator, normal lung volumes and moderately reduced diffusion. FEV1/FVC 0.79, DLCO 58% 6MWT -06/18/12- 96%, 88%, 94% 449 M. Significant desaturation with exercise. Overnight Oximetry 02/07/17-room air-sustained desaturation less than or equal to 88% for over 7 hours ----------------------------------------------------------------------------------------------------------------------------- 02/28/17- 81 year old male former smoker followed for dyspnea/labored breathing, complicated by CAD/MI/ stents/ PAD, DM, polycythemia O2 2 L/APS-sleep and exertion Overnight Oximetry 02/07/17-room air-sustained desaturation less than or equal to 88% for over 7 hours  Follows For: emphysema, needs order for O2, ONO done and wants to discuss results He was trying Symbicort again, not feeling Advair worked well for him. Very little wheeze or cough noted. He has been using oxygen routinely for sleep. Most recent hemoglobin was in normal range. He had spoken with his DME company about getting a portable oxygen concentrator for travel with a trip to [place] upcoming. CXR 02/15/17 IMPRESSION: Stable from prior.  No evidence of active disease.  07/02/17- 81 year old male former smoker followed for COPD, chronic respiratory failure with hypoxia,, complicated by CAD/MI/ stents/ PAD, DM2, polycythemia O2 2 L/APS-sleep and exertion Pt had flu shot 06/04/2017. Pt is doing well overall He paces himself. Not much cough and no sputum. No chest pain. Easy dyspnea on exertion. We qualified for home oxygen June 14. DME is sending some forms that need. He didn't like [facility]. Gets most meds through [place]. Had flu shot. Not using Spiriva regularly now.  ROS-see HPI  + = positive Constitutional:   No-   weight loss, night sweats, fevers,  chills, fatigue, lassitude. HEENT:   No-  headaches, difficulty swallowing, tooth/dental problems, sore throat,       No-  sneezing, itching, ear ache, nasal congestion, post nasal drip,  CV:  No-   chest pain, orthopnea, PND, swelling in lower extremities, anasarca, dizziness, palpitations Resp:   + "shortness of breath with exertion or at rest.              No-   productive cough,  No non-productive cough,  No- coughing up of blood.              No-   change in color of mucus.   wheezing.   Skin: No-   rash or lesions. GI:  No-   heartburn, indigestion, abdominal pain, nausea, vomiting, GU:  MS:  No-   joint pain or swelling.   Neuro-     nothing unusual Psych:  No- change in mood or affect. No depression or anxiety.  No memory loss.  OBJ- Physical Exam     General- Alert, Oriented, Affect-appropriate, Distress- none acute.  Skin- rash-none, lesions- none, excoriation- none Lymphadenopathy- none Head- atraumatic            Eyes- Gross vision intact, PERRLA, conjunctivae and secretions clear            Ears- Hearing, canals-normal            Nose- Clear, no-Septal dev, mucus, polyps, erosion, perforation             Throat- Mallampati II , mucosa clear , drainage- none, tonsils- atrophic Neck- flexible , trachea midline, no stridor , thyroid nl, carotid no bruit Chest - symmetrical excursion , unlabored           Heart/CV- RRR , no murmur , no gallop  , no rub, nl  s1 s2                           - JVD+ 1 cm , edema- none, stasis changes- none, varices- none           Lung- + distant/quiet/ unlabored, cough-none , dullness-none, rub- none, + slightly breathless on                  conversation with room air           Chest wall-  Abd-  Br/ Gen/ Rectal- Not done, not indicated Extrem- cyanosis- none, clubbing?- Question mild spooning of nails; atrophy- none, strength- nl Neuro- grossly intact to observation

## 2017-07-02 NOTE — Patient Instructions (Addendum)
We will try to learn from [place] if some paperwork is needed about your portable oxygen.  Sample Bevespi maintenance inhaler     Inhale 2 puffs, twice daily. Try this instead of Symbicort and Spiriva.            If you like it better, you can see if the [place] can fill the prescription.   Ok to go back to Symbicort and Spiriva  If that works as well and the [place] can get it for you.  Please call if we can help

## 2017-07-05 ENCOUNTER — Ambulatory Visit (INDEPENDENT_AMBULATORY_CARE_PROVIDER_SITE_OTHER): Payer: Medicare Other | Admitting: Endocrinology

## 2017-07-05 ENCOUNTER — Encounter: Payer: Self-pay | Admitting: Endocrinology

## 2017-07-05 VITALS — BP 118/72 | HR 73 | Wt 248.5 lb

## 2017-07-05 DIAGNOSIS — E291 Testicular hypofunction: Secondary | ICD-10-CM

## 2017-07-05 DIAGNOSIS — E1165 Type 2 diabetes mellitus with hyperglycemia: Secondary | ICD-10-CM

## 2017-07-05 DIAGNOSIS — I2510 Atherosclerotic heart disease of native coronary artery without angina pectoris: Secondary | ICD-10-CM

## 2017-07-05 NOTE — Progress Notes (Signed)
Patient ID: Jonathan [name]., male   DOB: [birth year], 81 y.o.   MRN: [id number]    Reason for Appointment:  Follow-up  History of Present Illness:          Diagnosis: Type 2 diabetes mellitus, date of diagnosis:  2009      Past history: He is not clear what his diabetes was diagnosed but according to hospital records it may have been in 2009. Most likely he was placed on metformin initially and at some point Amaryl added. In 2013 it was also given Januvia presumably to improve his control. However appears that his A1c has been consistently over 7% Janumet was started instead of metformin and Januvia separately in 2013  He was started on Trulicity in 2/54 instead of Victoza because excessive bruising on his abdomen  Later in 01/2015 he was switched to Bydureon because of insurance noncoverage of his Trulicity  Recent history:   Non-insulin hypoglycemic drugs the patient is taking are: Metformin 1000 twice a day, Amaryl 4 mg  in the morning, Trulicity 1.5 mg weekly, Jardiance 10 mg daily  His A1c previously had been progressively higher at 7.9 in June, now down to 6.9  Current blood sugar patterns and problems:  He has had better blood sugars with taking Jardiance 10 mg  Also at the same time Maxzide was reduced to half a tablet to prevent any worsening of renal function  His blood sugars are being checked somewhat infrequently but they are looking fairly close to normal most of the time in averaging only 123 overall  He is still trying to exercise regularly and his weight is down compared to last year but recently starting to go up a little  He does think that he is doing fairly well and his diet  However not checking enough readings after meals at night     Side effects from medications have been: None  Glucose monitoring:  done  Once a day         Glucometer:  FreeStyle Blood Glucose readings  by download as   Mean values apply above for all meters except median  for One Touch  PRE-MEAL Fasting Lunch Dinner Bedtime Overall  Glucose range:  97-1 29  97-1 68      Mean/median: 120    123   POST-MEAL PC Breakfast PC Lunch PC Dinner  Glucose range:   103-154   Mean/median:        Self-care:      Meals: 3 meals per day. eating egg/meat breading for breakfast, dinner 5-6    Exercise: Going 3-4/7 with cardio around 50 minutes at [place] visit: Most recent: 4/16 .               Weight history:  Wt Readings from Last 3 Encounters:  07/05/17 248 lb 8 oz (112.7 kg)  07/02/17 249 lb 12.8 oz (113.3 kg)  06/05/17 248 lb 3.2 oz (112.6 kg)   Glycemic control:  Lab Results  Component Value Date   HGBA1C 6.9 (H) 07/02/2017   HGBA1C 7.9 (H) 02/28/2017   HGBA1C 7.7 (H) 10/08/2016   Lab Results  Component Value Date   MICROALBUR <0.7 02/28/2017   LDLCALC 47 02/13/2017   CREATININE 1.49 07/02/2017    Lab Results  Component Value Date   FRUCTOSAMINE 284 03/29/2017   FRUCTOSAMINE 268 12/06/2016   FRUCTOSAMINE 282 02/22/2014    OTHER active problems: See review of systems  Lab on 07/02/2017  Component Date Value Ref Range Status  . Hgb A1c MFr Bld 07/02/2017 6.9* 4.6 - 6.5 % Final   Glycemic Control Guidelines for People with Diabetes:Non Diabetic:  <6%Goal of Therapy: <7%Additional Action Suggested:  >8%   . Sodium 07/02/2017 139  135 - 145 mEq/L Final  . Potassium 07/02/2017 3.9  3.5 - 5.1 mEq/L Final  . Chloride 07/02/2017 105  96 - 112 mEq/L Final  . CO2 07/02/2017 21  19 - 32 mEq/L Final  . Glucose, Bld 07/02/2017 125* 70 - 99 mg/dL Final  . BUN 07/02/2017 20  6 - 23 mg/dL Final  . Creatinine, Ser 07/02/2017 1.49  0.40 - 1.50 mg/dL Final  . Total Bilirubin 07/02/2017 0.4  0.2 - 1.2 mg/dL Final  . Alkaline Phosphatase 07/02/2017 69  39 - 117 U/L Final  . AST 07/02/2017 23  0 - 37 U/L Final  . ALT 07/02/2017 28  0 - 53 U/L Final  . Total Protein 07/02/2017 6.8  6.0 - 8.3 g/dL Final  . Albumin 07/02/2017 3.8  3.5 - 5.2 g/dL  Final  . Calcium 07/02/2017 9.3  8.4 - 10.5 mg/dL Final  . GFR 07/02/2017 58.46* >60.00 mL/min Final     Allergies as of 07/05/2017   No Known Allergies     Medication List       Accurate as of 07/05/17  9:43 AM. Always use your most recent med list.          amLODipine 10 MG tablet Commonly known as:  NORVASC TAKE 1 TABLET DAILY   aspirin 81 MG tablet Take 81 mg by mouth daily.   empagliflozin 10 MG Tabs tablet Commonly known as:  JARDIANCE Take 10 mg by mouth daily.   glimepiride 4 MG tablet Commonly known as:  AMARYL Take 1 tablet (4 mg total) by mouth daily with breakfast.   Glycopyrrolate-Formoterol 9-4.8 MCG/ACT Aero Commonly known as:  BEVESPI AEROSPHERE Inhale 2 puffs into the lungs 2 (two) times daily.   losartan 100 MG tablet Commonly known as:  COZAAR Take 1 tablet (100 mg total) by mouth daily.   metFORMIN 1000 MG tablet Commonly known as:  GLUCOPHAGE TAKE 1 TABLET TWICE A DAY WITH MEALS   metoprolol succinate 100 MG 24 hr tablet Commonly known as:  TOPROL XL Take 1 tablet (100 mg total) by mouth daily.   multivitamin with minerals Tabs tablet Take 1 tablet by mouth daily.   simvastatin 20 MG tablet Commonly known as:  ZOCOR Take 1 tablet (20 mg total) by mouth at bedtime.   Testosterone 20.25 MG/ACT (1.62%) Gel Place 4 application onto the skin daily.   triamterene-hydrochlorothiazide 37.5-25 MG tablet Commonly known as:  MAXZIDE-25 Take 1 tablet by mouth daily.   TRULICITY 1.5 WE/3.1VQ Sopn Generic drug:  Dulaglutide INJECT 1.5 MG INTO SKIN WEEKLY       Allergies: No Known Allergies  Past Medical History:  Diagnosis Date  . CAD (coronary artery disease)    Two RCA stents remotely / 3rd RCA stent 2006  . Colon polyps    s/p several Cscopes.  Marland Kitchen COPD (chronic obstructive pulmonary disease) (HCC)    on O2, nocturnal  . Diabetes mellitus    dx aprox 2009  . ED (erectile dysfunction)    has a vacumm device  . Ejection  fraction   . Hyperlipidemia    dx in 90s  . Hypertension    dx in the 90  . Hypogonadism male   . PVD (peripheral  vascular disease) ([place])    s/p stents at LE 2009, Dr Einar Gip  . Shortness of breath    O2 Sat dropped to 82% walking on the treadmill, September, 2012    Past Surgical History:  Procedure Laterality Date  . APPENDECTOMY    . TONSILLECTOMY      Family History  Problem Relation Age of Onset  . Heart disease Father   . Hypertension Father   . Stroke Father   . Diabetes Paternal Aunt   . Diabetes Maternal Grandmother   . Diabetes Other        GM, nephews, many family members  . Hyperlipidemia Other        ?  Marland Kitchen Cancer Brother   . Colon cancer Neg Hx   . Prostate cancer Neg Hx     Social History:  reports that he quit smoking about 40 years ago. His smoking use included Cigarettes. He has a 60.00 pack-year smoking history. He has never used smokeless tobacco. He reports that he drinks alcohol. He reports that he does not use drugs.    Review of Systems        Lipids: He has been treated with simvastatin 20 mg with good control       Lab Results  Component Value Date   CHOL 99 02/13/2017   HDL 35.50 (L) 02/13/2017   LDLCALC 47 02/13/2017   TRIG 84.0 02/13/2017   CHOLHDL 3 02/13/2017                      Has had erectile dysfunction for quite sometime.  Also had decreased libido previously and was found to have hypogonadism, probably in 2011. He did have a slightly low free testosterone level in 2012 on treatment Prolactin level normal  Has been doing well with AndroGel 4 pumps daily, no recent fatigue  On his last visit his testosterone level was low He was told to be more compliant with his AndroGel regularly  PSA normal  Lab Results  Component Value Date   TESTOSTERONE 162.24 (L) 03/29/2017   Lab Results  Component Value Date   PSA 0.56 04/02/2016   PSA 0.60 01/20/2014   PSA 0.59 12/11/2011         HYPERTENSION: This is followed by PCP  and is on 100mg  of losartan along with half Maxide, amlodipine and metoprolol   Maxzide reduced to half a tablet with Jardiance  Blood pressure checked at home and he thinks it is about 1 40-981 systolic No lightheadedness    BP Readings from Last 3 Encounters:  07/05/17 118/72  07/02/17 124/76  06/05/17 126/72     LABS:  Lab on 07/02/2017  Component Date Value Ref Range Status  . Hgb A1c MFr Bld 07/02/2017 6.9* 4.6 - 6.5 % Final   Glycemic Control Guidelines for People with Diabetes:Non Diabetic:  <6%Goal of Therapy: <7%Additional Action Suggested:  >8%   . Sodium 07/02/2017 139  135 - 145 mEq/L Final  . Potassium 07/02/2017 3.9  3.5 - 5.1 mEq/L Final  . Chloride 07/02/2017 105  96 - 112 mEq/L Final  . CO2 07/02/2017 21  19 - 32 mEq/L Final  . Glucose, Bld 07/02/2017 125* 70 - 99 mg/dL Final  . BUN 07/02/2017 20  6 - 23 mg/dL Final  . Creatinine, Ser 07/02/2017 1.49  0.40 - 1.50 mg/dL Final  . Total Bilirubin 07/02/2017 0.4  0.2 - 1.2 mg/dL Final  . Alkaline Phosphatase 07/02/2017 69  39 -  117 U/L Final  . AST 07/02/2017 23  0 - 37 U/L Final  . ALT 07/02/2017 28  0 - 53 U/L Final  . Total Protein 07/02/2017 6.8  6.0 - 8.3 g/dL Final  . Albumin 07/02/2017 3.8  3.5 - 5.2 g/dL Final  . Calcium 07/02/2017 9.3  8.4 - 10.5 mg/dL Final  . GFR 07/02/2017 58.46* >60.00 mL/min Final    Physical Examination:  BP 118/72 (BP Location: Left Arm, Cuff Size: Large)   Pulse 73   Wt 248 lb 8 oz (112.7 kg)   SpO2 95%   BMI 31.91 kg/m   No ankle edema present Blood pressure initially high with small cuff    ASSESSMENT/PLAN:   Diabetes type 2, with obesity See history of present illness for discussion of current diabetes management, blood sugar patterns and problems identified  His blood sugars are excellent now with his multidrug regimen including Jardiance A1c is down to 6.9, is better than usual Home blood sugars are mostly fairly good also Does need to check readings after  meals more often  Recommend continuing Jardiance even though creatinine is high normal, he is a good candidate for this medication with history of CAD  HYPERTENSION: Blood pressure is well-controlled  Serum creatinine is upper normal but stable   Hypogonadism: He will need follow-up labs on the next visit   There are no Patient Instructions on file for this visit.  Jeilani Grupe 07/05/2017, 9:43 AM   Note: This office note was prepared with Dragon voice recognition system technology. Any transcriptional errors that result from this process are unintentional.

## 2017-08-09 ENCOUNTER — Other Ambulatory Visit: Payer: Self-pay | Admitting: Internal Medicine

## 2017-08-15 ENCOUNTER — Encounter: Payer: Self-pay | Admitting: Internal Medicine

## 2017-08-15 ENCOUNTER — Ambulatory Visit (INDEPENDENT_AMBULATORY_CARE_PROVIDER_SITE_OTHER): Payer: Medicare Other | Admitting: Internal Medicine

## 2017-08-15 VITALS — BP 122/68 | HR 74 | Temp 98.2°F | Resp 14 | Ht 74.0 in | Wt 248.4 lb

## 2017-08-15 DIAGNOSIS — I2510 Atherosclerotic heart disease of native coronary artery without angina pectoris: Secondary | ICD-10-CM | POA: Diagnosis not present

## 2017-08-15 DIAGNOSIS — F439 Reaction to severe stress, unspecified: Secondary | ICD-10-CM | POA: Diagnosis not present

## 2017-08-15 DIAGNOSIS — K922 Gastrointestinal hemorrhage, unspecified: Secondary | ICD-10-CM | POA: Diagnosis not present

## 2017-08-15 DIAGNOSIS — I10 Essential (primary) hypertension: Secondary | ICD-10-CM

## 2017-08-15 NOTE — Progress Notes (Signed)
Subjective:    Patient ID: Jonathan [name]., male    DOB: 12-May-1936, 81 y.o.   MRN: [id number]  DOS:  08/15/2017 Type of visit - description : rov Interval history: No major concerns DM: Last note from Endo reviewed, well controlled Respiratory failure: Last note from pulmonary reviewed, good compliance with qhs oxygen  CAD, good compliance of medication, no symptoms   Review of Systems Chest pain or difficulty breathing No nausea, vomiting, diarrhea. No further red blood per rectum  Past Medical History:  Diagnosis Date  . CAD (coronary artery disease)    Two RCA stents remotely / 3rd RCA stent 2006  . Colon polyps    s/p several Cscopes.  Marland Kitchen COPD (chronic obstructive pulmonary disease) (HCC)    on O2, nocturnal  . Diabetes mellitus    dx aprox 2009  . ED (erectile dysfunction)    has a vacumm device  . Ejection fraction   . Hyperlipidemia    dx in 90s  . Hypertension    dx in the 90  . Hypogonadism male   . PVD (peripheral vascular disease) ([place])    s/p stents at LE 2009, Dr Einar Gip  . Shortness of breath    O2 Sat dropped to 82% walking on the treadmill, September, 2012    Past Surgical History:  Procedure Laterality Date  . APPENDECTOMY    . TONSILLECTOMY      Social History   Socioeconomic History  . Marital status: Married    Spouse name: Cerrella   . Number of children: 6  . Years of education: Not on file  . Highest education level: Not on file  Social Needs  . Financial resource strain: Not on file  . Food insecurity - worry: Not on file  . Food insecurity - inability: Not on file  . Transportation needs - medical: Not on file  . Transportation needs - non-medical: Not on file  Occupational History  . 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 Smoking status: Former Smoker    Packs/day: 2.00    Years: 30.00    Pack years: 60.00    Types: Cigarettes    Last attempt to quit: 06/17/1977    Years since  quitting: 40.1  . Smokeless tobacco: Never Used  . Tobacco comment: 2 ppd, quit 1987  Substance and Sexual Activity  . Alcohol use: Yes    Alcohol/week: 0.0 oz    Comment: socially   . Drug use: No  . Sexual activity: Yes  Other Topics Concern  . Not on file  Social History Narrative   Has 2 step sons, and 4 children (lost 1 son)                   Allergies as of 08/15/2017   No Known Allergies     Medication List        Accurate as of 08/15/17 11:59 PM. Always use your most recent med list.          amLODipine 10 MG tablet Commonly known as:  NORVASC TAKE 1 TABLET DAILY   aspirin 81 MG tablet Take 81 mg by mouth daily.   empagliflozin 10 MG Tabs tablet Commonly known as:  JARDIANCE Take 10 mg by mouth daily.   glimepiride 4 MG tablet Commonly known as:  AMARYL Take 1 tablet (4 mg total) by mouth daily with breakfast.   Glycopyrrolate-Formoterol 9-4.8 MCG/ACT Aero  Commonly known as:  BEVESPI AEROSPHERE Inhale 2 puffs into the lungs 2 (two) times daily.   losartan 100 MG tablet Commonly known as:  COZAAR Take 1 tablet (100 mg total) by mouth daily.   metFORMIN 1000 MG tablet Commonly known as:  GLUCOPHAGE TAKE 1 TABLET TWICE A DAY WITH MEALS   metoprolol succinate 100 MG 24 hr tablet Commonly known as:  TOPROL-XL Take 1 tablet (100 mg total) by mouth daily.   multivitamin with minerals Tabs tablet Take 1 tablet by mouth daily.   simvastatin 20 MG tablet Commonly known as:  ZOCOR Take 0.5 tablets (10 mg total) by mouth at bedtime.   Testosterone 20.25 MG/ACT (1.62%) Gel Place 4 application onto the skin daily.   triamterene-hydrochlorothiazide 37.5-25 MG tablet Commonly known as:  MAXZIDE-25 Take 0.5 tablets by mouth daily.   TRULICITY 1.5 ZO/1.0RU Sopn Generic drug:  Dulaglutide INJECT 1.5 MG INTO SKIN WEEKLY          Objective:   Physical Exam BP 122/68 (BP Location: Left Arm, Patient Position: Sitting, Cuff Size: Small)   Pulse  74   Temp 98.2 F (36.8 C) (Oral)   Resp 14   Ht 6\' 2"  (1.88 m)   Wt 248 lb 6 oz (112.7 kg)   SpO2 97%   BMI 31.89 kg/m   General:   Well developed, well nourished . NAD.  HEENT:  Normocephalic . Face symmetric, atraumatic Lungs:  CTA B Normal respiratory effort, no intercostal retractions, no accessory muscle use. Heart: RRR,  no murmur.  No pretibial edema bilaterally  Skin: Not pale. Not jaundice Neurologic:  alert & oriented X3.  Speech normal, gait appropriate for age and unassisted Psych--  Cognition and judgment appear intact.  Cooperative with normal attention span and concentration.  Behavior appropriate. No anxious or depressed appearing.      Assessment & Plan:  Assessment DM dx ~0454, complicated by CAD, PVD, mild neuropathy. Sees Dr. Dwyane Dee Hypogonadism. Sees Dr. Dwyane Dee HTN Hyperlipidemia COPD, nocturnal O2 prn Back pain, chronic: See visit 10/12/2016 CV: -CAD, Dr. Ron Parker Dr Meda Coffee S/p  stenting 2 to the RCA remote past and the third RCA stent in  2006, negative stress test in 2012, LVEF 55% on echo 2015 -Peripheral vascular disease, stents lower extremity 2009  ED  PLAN    HTN: Currently on amlodipine, metoprolol, Maxide (taking only half tablet daily, patient stated was recommended that by Endo?). Seems controlled, last BMP satisfactory, no change High cholesterol: Taking half zocor, FLP satisfactory, recheck on RTC Blood per rectum: See last OV, due to bleed plavix was DC'd, I communicate via message with cardiology and d/c plavix was okay. Now asx   CAD: Continue aspirin only. Asx  Stress : Patient's wife is my patient as well, she has some behavioral issues, patient is counseled. RTC 6 months, yearly checkup

## 2017-08-15 NOTE — Progress Notes (Signed)
Pre visit review using our clinic review tool, if applicable. No additional management support is needed unless otherwise documented below in the visit note. 

## 2017-08-15 NOTE — Patient Instructions (Addendum)
GO TO THE LAB : Get the blood work     GO TO THE FRONT DESK Schedule your next appointment for a yearly checkup in 6 months

## 2017-08-16 NOTE — Assessment & Plan Note (Addendum)
HTN: Currently on amlodipine, metoprolol, Maxide (taking only half tablet daily, patient stated was recommended that by Endo?). Seems controlled, last BMP satisfactory, no change High cholesterol: Taking half zocor, FLP satisfactory, recheck on RTC Blood per rectum: See last OV, due to bleed plavix was DC'd, I communicate via message with cardiology and d/c plavix was okay. Now asx   CAD: Continue aspirin only. Asx  Stress : Patient's wife is my patient as well, she has some behavioral issues, patient is counseled. RTC 6 months, yearly checkup

## 2017-08-17 LAB — HM DIABETES EYE EXAM

## 2017-09-20 ENCOUNTER — Other Ambulatory Visit: Payer: Self-pay | Admitting: Endocrinology

## 2017-10-07 ENCOUNTER — Other Ambulatory Visit (INDEPENDENT_AMBULATORY_CARE_PROVIDER_SITE_OTHER): Payer: Medicare Other

## 2017-10-07 DIAGNOSIS — E291 Testicular hypofunction: Secondary | ICD-10-CM

## 2017-10-07 DIAGNOSIS — E1165 Type 2 diabetes mellitus with hyperglycemia: Secondary | ICD-10-CM

## 2017-10-07 LAB — COMPREHENSIVE METABOLIC PANEL
ALT: 19 U/L (ref 0–53)
AST: 18 U/L (ref 0–37)
Albumin: 3.8 g/dL (ref 3.5–5.2)
Alkaline Phosphatase: 57 U/L (ref 39–117)
BILIRUBIN TOTAL: 0.6 mg/dL (ref 0.2–1.2)
BUN: 11 mg/dL (ref 6–23)
CALCIUM: 9 mg/dL (ref 8.4–10.5)
CO2: 26 meq/L (ref 19–32)
CREATININE: 1.26 mg/dL (ref 0.40–1.50)
Chloride: 105 mEq/L (ref 96–112)
GFR: 70.89 mL/min (ref >60.00)
Glucose, Bld: 166 mg/dL — ABNORMAL HIGH (ref 70–99)
Potassium: 3.6 mEq/L (ref 3.5–5.1)
Sodium: 139 mEq/L (ref 135–145)
Total Protein: 6.4 g/dL (ref 6.0–8.3)

## 2017-10-07 LAB — HEMOGLOBIN A1C: Hgb A1c MFr Bld: 6.5 % (ref 4.6–6.5)

## 2017-10-07 LAB — TESTOSTERONE: Testosterone: 497.15 ng/dL (ref 300.00–890.00)

## 2017-10-10 ENCOUNTER — Ambulatory Visit (INDEPENDENT_AMBULATORY_CARE_PROVIDER_SITE_OTHER): Payer: Medicare Other | Admitting: Endocrinology

## 2017-10-10 ENCOUNTER — Encounter: Payer: Self-pay | Admitting: Endocrinology

## 2017-10-10 VITALS — BP 138/72 | HR 83 | Ht 74.0 in | Wt 242.0 lb

## 2017-10-10 DIAGNOSIS — E1165 Type 2 diabetes mellitus with hyperglycemia: Secondary | ICD-10-CM

## 2017-10-10 DIAGNOSIS — E1142 Type 2 diabetes mellitus with diabetic polyneuropathy: Secondary | ICD-10-CM | POA: Diagnosis not present

## 2017-10-10 DIAGNOSIS — E291 Testicular hypofunction: Secondary | ICD-10-CM

## 2017-10-10 DIAGNOSIS — I10 Essential (primary) hypertension: Secondary | ICD-10-CM

## 2017-10-10 NOTE — Progress Notes (Signed)
Patient ID: [name], male   DOB: 10-17-1935, 82 y.o.   MRN: [id number]    Reason for Appointment:  Follow-up  History of Present Illness:          Diagnosis: Type 2 diabetes mellitus, date of diagnosis:  2009      Past history: He is not clear what his diabetes was diagnosed but according to hospital records it may have been in 2009. Most likely he was placed on metformin initially and at some point Amaryl added. In 2013 it was also given Januvia presumably to improve his control. However appears that his A1c has been consistently over 7% Janumet was started instead of metformin and Januvia separately in 2013  He was started on Trulicity in 9/37 instead of Victoza because excessive bruising on his abdomen  Later in 01/2015 he was switched to Bydureon because of insurance noncoverage of his Trulicity  Recent history:   Non-insulin hypoglycemic drugs are: Metformin 1000 twice a day, Amaryl 4 mg  in the morning, Trulicity 1.5 mg weekly, Jardiance 10 mg daily  His A1c is progressively improving and now 6.5, was higher at 7.9 in June 2018  Current blood sugar patterns and problems:  He has had fairly good blood sugars when he checks them although his doing mostly fasting readings and some at variable times in the evenings  His blood sugars are relatively lower in the evening compared to the morning  He takes Amaryl in the morning; he did have a low sugar about 3 weeks ago in the late evening possibly from late meal  Not clear why his lab glucose was 166, was 97 in the morning prior to coming to the lab, not clear if he was fasting  Sometimes will have only oatmeal without protein for breakfast  He has lost about 6 pounds  He says that he has had more stress taking care of his terminally ill brother and variable sleep and waking times and mealtimes  Overall he thinks his portions are controlled  No side effects with Jardiance and renal function is stable    Side effects from medications have been: None  Glucose monitoring:  done  Once a day         Glucometer:  FreeStyle Blood Glucose readings  by download   Mean values apply above for all meters except median for One Touch  PRE-MEAL Fasting Lunch Dinner Bedtime Overall  Glucose range: 87-155    54-135    Mean/median:     114   POST-MEAL PC Breakfast PC Lunch PC Dinner  Glucose range: 113-171     Mean/median:        Self-care:      Meals: 3 meals per day. eating egg/meat breading for breakfast, dinner 5-6    Exercise:  this is being done 3-4/7 with cardio around 50 minutes at [facility] visit: Most recent: 4/16 .               Weight history:  Wt Readings from Last 3 Encounters:  10/10/17 242 lb (109.8 kg)  08/15/17 248 lb 6 oz (112.7 kg)  07/05/17 248 lb 8 oz (112.7 kg)   Glycemic control:  Lab Results  Component Value Date   HGBA1C 6.5 10/07/2017   HGBA1C 6.9 (H) 07/02/2017   HGBA1C 7.9 (H) 02/28/2017   Lab Results  Component Value Date   MICROALBUR <0.7 02/28/2017   LDLCALC 47 02/13/2017   CREATININE 1.26 10/07/2017  Lab Results  Component Value Date   FRUCTOSAMINE 284 03/29/2017   FRUCTOSAMINE 268 12/06/2016   FRUCTOSAMINE 282 02/22/2014    OTHER active problems: See review of systems  Lab on 10/07/2017  Component Date Value Ref Range Status  . Testosterone 10/07/2017 497.15  300.00 - 890.00 ng/dL Final  . Sodium 10/07/2017 139  135 - 145 mEq/L Final  . Potassium 10/07/2017 3.6  3.5 - 5.1 mEq/L Final  . Chloride 10/07/2017 105  96 - 112 mEq/L Final  . CO2 10/07/2017 26  19 - 32 mEq/L Final  . Glucose, Bld 10/07/2017 166* 70 - 99 mg/dL Final  . BUN 10/07/2017 11  6 - 23 mg/dL Final  . Creatinine, Ser 10/07/2017 1.26  0.40 - 1.50 mg/dL Final  . Total Bilirubin 10/07/2017 0.6  0.2 - 1.2 mg/dL Final  . Alkaline Phosphatase 10/07/2017 57  39 - 117 U/L Final  . AST 10/07/2017 18  0 - 37 U/L Final  . ALT 10/07/2017 19  0 - 53 U/L Final  . Total  Protein 10/07/2017 6.4  6.0 - 8.3 g/dL Final  . Albumin 10/07/2017 3.8  3.5 - 5.2 g/dL Final  . Calcium 10/07/2017 9.0  8.4 - 10.5 mg/dL Final  . GFR 10/07/2017 70.89  >60.00 mL/min Final  . Hgb A1c MFr Bld 10/07/2017 6.5  4.6 - 6.5 % Final   Glycemic Control Guidelines for People with Diabetes:Non Diabetic:  <6%Goal of Therapy: <7%Additional Action Suggested:  >8%      Allergies as of 10/10/2017   No Known Allergies     Medication List        Accurate as of 10/10/17  8:26 AM. Always use your most recent med list.          amLODipine 10 MG tablet Commonly known as:  NORVASC TAKE 1 TABLET DAILY   aspirin 81 MG tablet Take 81 mg by mouth daily.   empagliflozin 10 MG Tabs tablet Commonly known as:  JARDIANCE Take 10 mg by mouth daily.   glimepiride 4 MG tablet Commonly known as:  AMARYL Take 1 tablet (4 mg total) by mouth daily with breakfast.   Glycopyrrolate-Formoterol 9-4.8 MCG/ACT Aero Commonly known as:  BEVESPI AEROSPHERE Inhale 2 puffs into the lungs 2 (two) times daily.   losartan 100 MG tablet Commonly known as:  COZAAR Take 1 tablet (100 mg total) by mouth daily.   metFORMIN 1000 MG tablet Commonly known as:  GLUCOPHAGE TAKE 1 TABLET TWICE A DAY WITH MEALS   metoprolol succinate 100 MG 24 hr tablet Commonly known as:  TOPROL-XL Take 1 tablet (100 mg total) by mouth daily.   multivitamin with minerals Tabs tablet Take 1 tablet by mouth daily.   simvastatin 20 MG tablet Commonly known as:  ZOCOR Take 0.5 tablets (10 mg total) by mouth at bedtime.   Testosterone 20.25 MG/ACT (1.62%) Gel Place 4 application onto the skin daily.   triamterene-hydrochlorothiazide 37.5-25 MG tablet Commonly known as:  MAXZIDE-25 Take 0.5 tablets by mouth daily.   TRULICITY 1.5 XB/1.4NW Sopn Generic drug:  Dulaglutide INJECT 1.5 MG INTO THE SKIN WEEKLY       Allergies: No Known Allergies  Past Medical History:  Diagnosis Date  . CAD (coronary artery disease)      Two RCA stents remotely / 3rd RCA stent 2006  . Colon polyps    s/p several Cscopes.  Marland Kitchen COPD (chronic obstructive pulmonary disease) (HCC)    on O2, nocturnal  . Diabetes mellitus  dx aprox 2009  . ED (erectile dysfunction)    has a vacumm device  . Ejection fraction   . Hyperlipidemia    dx in 90s  . Hypertension    dx in the 90  . Hypogonadism male   . PVD (peripheral vascular disease) ([place])    s/p stents at [facility] 2009, Dr Einar Gip  . Shortness of breath    O2 Sat dropped to 82% walking on the treadmill, September, 2012    Past Surgical History:  Procedure Laterality Date  . APPENDECTOMY    . TONSILLECTOMY      Family History  Problem Relation Age of Onset  . Heart disease Father   . Hypertension Father   . Stroke Father   . Diabetes Paternal Aunt   . Diabetes Maternal Grandmother   . Diabetes Other        GM, nephews, many family members  . Hyperlipidemia Other        ?  Marland Kitchen Cancer Brother   . Colon cancer Neg Hx   . Prostate cancer Neg Hx     Social History:  reports that he quit smoking about 40 years ago. His smoking use included cigarettes. He has a 60.00 pack-year smoking history. he has never used smokeless tobacco. He reports that he drinks alcohol. He reports that he does not use drugs.    Review of Systems        Lipids: He has been treated with simvastatin 20 mg with good control       Lab Results  Component Value Date   CHOL 99 02/13/2017   HDL 35.50 (L) 02/13/2017   LDLCALC 47 02/13/2017   TRIG 84.0 02/13/2017   CHOLHDL 3 02/13/2017                  Has had erectile dysfunction for quite sometime.  Also had decreased libido previously and was found to have hypogonadism, probably in 2011. He did have a slightly low free testosterone level in 2012 on treatment Prolactin level normal  Has been doing well with AndroGel 4 pumps daily, but now he states that he is applying half of the gel on the upper arm and the other half on his hips No  complaints of unusual fatigue  On his last visit his testosterone level was low He was told to be more compliant with his AndroGel regularly His level is nearly 500 now  PSA normal  Lab Results  Component Value Date   TESTOSTERONE 497.15 10/07/2017   Lab Results  Component Value Date   PSA 0.56 04/02/2016   PSA 0.60 01/20/2014   PSA 0.59 12/11/2011         HYPERTENSION: This is followed by PCP and is on 100mg  of losartan along with half Maxide, amlodipine and metoprolol   Maxzide reduced to half a tablet with Jardiance  Blood pressure checked at home and he thinks it is about 409 systolic No lightheadedness    BP Readings from Last 3 Encounters:  10/10/17 138/72  08/15/17 122/68  07/05/17 118/72     LABS:  Lab on 10/07/2017  Component Date Value Ref Range Status  . Testosterone 10/07/2017 497.15  300.00 - 890.00 ng/dL Final  . Sodium 10/07/2017 139  135 - 145 mEq/L Final  . Potassium 10/07/2017 3.6  3.5 - 5.1 mEq/L Final  . Chloride 10/07/2017 105  96 - 112 mEq/L Final  . CO2 10/07/2017 26  19 - 32 mEq/L Final  . Glucose,  Bld 10/07/2017 166* 70 - 99 mg/dL Final  . BUN 10/07/2017 11  6 - 23 mg/dL Final  . Creatinine, Ser 10/07/2017 1.26  0.40 - 1.50 mg/dL Final  . Total Bilirubin 10/07/2017 0.6  0.2 - 1.2 mg/dL Final  . Alkaline Phosphatase 10/07/2017 57  39 - 117 U/L Final  . AST 10/07/2017 18  0 - 37 U/L Final  . ALT 10/07/2017 19  0 - 53 U/L Final  . Total Protein 10/07/2017 6.4  6.0 - 8.3 g/dL Final  . Albumin 10/07/2017 3.8  3.5 - 5.2 g/dL Final  . Calcium 10/07/2017 9.0  8.4 - 10.5 mg/dL Final  . GFR 10/07/2017 70.89  >60.00 mL/min Final  . Hgb A1c MFr Bld 10/07/2017 6.5  4.6 - 6.5 % Final   Glycemic Control Guidelines for People with Diabetes:Non Diabetic:  <6%Goal of Therapy: <7%Additional Action Suggested:  >8%     Physical Examination:  BP 138/72   Pulse 83   Ht 6\' 2"  (1.88 m)   Wt 242 lb (109.8 kg)   SpO2 94%   BMI 31.07 kg/m   Diabetic  Foot Exam - Simple   Simple Foot Form Diabetic Foot exam was performed with the following findings:  Yes 10/10/2017  8:28 AM  Visual Inspection No deformities, no ulcerations, no other skin breakdown bilaterally:  Yes Sensation Testing Intact to touch and monofilament testing bilaterally:  Yes See comments:  Yes Pulse Check Posterior Tibialis and Dorsalis pulse intact bilaterally:  Yes Comments Decreased sensation left big toe        ASSESSMENT/PLAN:   Diabetes type 2, with obesity See history of present illness for discussion of current diabetes management, blood sugar patterns and problems identified  His blood sugars are excellent now with his multidrug regimen including Jardiance A1c is down to , 6.5 He has had only one documented hypoglycemia with blood sugars are lower in the evenings Recommendations:  Take only half tablet Amaryl in the morning  Add protein to breakfast daily and avoid high-fat foods  Discussed blood sugar targets and needs to do more readings after meals and less in the morning on waking up  Foot exam only decreased sensation on the left big toe, possibly neuropathy, discussed implications of neuropathy and regular foot care  HYPERTENSION: Blood pressure is well-controlled, he also monitors at home  Serum creatinine is upper normal but stable with continued use of Jardiance  Hypogonadism: Discussed site of application of the AndroGel and he needs to stop using the hips and use only the upper arm, discussed application technique He will reduce the dose to 3 pumps a day because of a relatively high level for his age Again continue to be regular with the application  He will need follow-up labs on the next visit   Patient Instructions  Glimeperide 1/2 pill   Check blood sugars on waking up  3/7 days  Also check blood sugars about 2 hours after a meal and do this after different meals by rotation  Recommended blood sugar levels on waking up is  90-130 and about 2 hours after meal is 130-160  Please bring your blood sugar monitor to each visit, thank you  Cut Androgel to 3 pumps not 4 on arm  Counseling time on subjects discussed in assessment and plan sections is over 50% of today's 25 minute visit   Elayne Snare 10/10/2017, 8:26 AM   Note: This office note was prepared with Dragon voice recognition system technology. Any transcriptional errors  that result from this process are unintentional.

## 2017-10-10 NOTE — Patient Instructions (Addendum)
Glimeperide 1/2 pill   Check blood sugars on waking up  3/7 days  Also check blood sugars about 2 hours after a meal and do this after different meals by rotation  Recommended blood sugar levels on waking up is 90-130 and about 2 hours after meal is 130-160  Please bring your blood sugar monitor to each visit, thank you  Cut Androgel to 3 pumps not 4 on arm

## 2018-01-01 ENCOUNTER — Other Ambulatory Visit (INDEPENDENT_AMBULATORY_CARE_PROVIDER_SITE_OTHER): Payer: Medicare Other

## 2018-01-01 ENCOUNTER — Other Ambulatory Visit: Payer: Self-pay | Admitting: Endocrinology

## 2018-01-01 DIAGNOSIS — E291 Testicular hypofunction: Secondary | ICD-10-CM

## 2018-01-01 DIAGNOSIS — E1165 Type 2 diabetes mellitus with hyperglycemia: Secondary | ICD-10-CM

## 2018-01-01 LAB — TESTOSTERONE: Testosterone: 340.7 ng/dL (ref 300.00–890.00)

## 2018-01-01 LAB — COMPREHENSIVE METABOLIC PANEL
ALBUMIN: 3.9 g/dL (ref 3.5–5.2)
ALK PHOS: 75 U/L (ref 39–117)
ALT: 21 U/L (ref 0–53)
AST: 17 U/L (ref 0–37)
BILIRUBIN TOTAL: 0.5 mg/dL (ref 0.2–1.2)
BUN: 21 mg/dL (ref 6–23)
CALCIUM: 9.1 mg/dL (ref 8.4–10.5)
CO2: 23 mEq/L (ref 19–32)
CREATININE: 1.3 mg/dL (ref 0.40–1.50)
Chloride: 105 mEq/L (ref 96–112)
GFR: 68.33 mL/min (ref >60.00)
Glucose, Bld: 133 mg/dL — ABNORMAL HIGH (ref 70–99)
Potassium: 4 mEq/L (ref 3.5–5.1)
SODIUM: 138 meq/L (ref 135–145)
Total Protein: 6.6 g/dL (ref 6.0–8.3)

## 2018-01-01 LAB — HEMOGLOBIN A1C: HEMOGLOBIN A1C: 6.7 % — AB (ref 4.6–6.5)

## 2018-01-01 LAB — LIPID PANEL
Cholesterol: 96 mg/dL (ref 0–200)
HDL: 31.7 mg/dL — AB (ref >39.00)
NONHDL: 64.61
Total CHOL/HDL Ratio: 3
Triglycerides: 209 mg/dL — ABNORMAL HIGH (ref 0.0–149.0)
VLDL: 41.8 mg/dL — AB (ref 0.0–40.0)

## 2018-01-01 LAB — MICROALBUMIN / CREATININE URINE RATIO
CREATININE, U: 133.4 mg/dL
MICROALB UR: 4.8 mg/dL — AB (ref 0.0–1.9)
Microalb Creat Ratio: 3.6 mg/g (ref 0.0–30.0)

## 2018-01-01 LAB — LDL CHOLESTEROL, DIRECT: Direct LDL: 55 mg/dL

## 2018-01-03 ENCOUNTER — Other Ambulatory Visit: Payer: Medicare Other

## 2018-01-07 NOTE — Progress Notes (Signed)
Patient ID: Jonathan Leather., male   DOB: 12-29-1935, 81 y.o.   MRN: [id number]    Reason for Appointment:  Follow-up  History of Present Illness:          Diagnosis: Type 2 diabetes mellitus, date of diagnosis:  2009      Past history: He is not clear what his diabetes was diagnosed but according to hospital records it may have been in 2009. Most likely he was placed on metformin initially and at some point Amaryl added. In 2013 it was also given Januvia presumably to improve his control. However appears that his A1c has been consistently over 7% Janumet was started instead of metformin and Januvia separately in 2013  He was started on Trulicity in 5/18 instead of Victoza because excessive bruising on his abdomen  Later in 01/2015 he was switched to Bydureon because of insurance noncoverage of his Trulicity  Recent history:   Non-insulin hypoglycemic drugs are: Metformin 1000 twice a day, Amaryl 4 mg  in the morning, Trulicity 1.5 mg weekly, Jardiance 10 mg daily  His A1c is consistently controlled now 6.7  Current blood sugar patterns and problems:  He has had blood sugars near normal although taking mostly in the mornings and sporadically evening probably before supper more often  Not clear why his A1c is slightly higher than previous level of 6.5  However may have relatively higher fasting readings, lab glucose was 133  He was told to reduce his Amaryl to half tablet on the last visit forgot to do that  No hypoglycemic symptoms during the day exercising fairly consistently and mostly in the evening before suppertime  Highest blood sugar 154 at home     Side effects from medications have been: None  Glucose monitoring:  done  Once a day the bottom       Glucometer:  FreeStyle Blood Glucose readings  by download   Mean values apply above for all meters except median for One Touch  PRE-MEAL Fasting Lunch Dinner Bedtime Overall  Glucose range:  99-131   75-154    130   Mean/median:      111     Self-care:      Meals: 3 meals per day. eating egg/meat bread or Boost for breakfast, dinner 6-7 PM    Exercise:  this is being done 3-4/7 days with cardio for about 50 minutes at HOME at 6 pm  Dietician visit: Most recent: 4/16 .               Weight history:  Wt Readings from Last 3 Encounters:  01/08/18 242 lb 9.6 oz (110 kg)  10/10/17 242 lb (109.8 kg)  08/15/17 248 lb 6 oz (112.7 kg)   Glycemic control:  Lab Results  Component Value Date   HGBA1C 6.7 (H) 01/01/2018   HGBA1C 6.5 10/07/2017   HGBA1C 6.9 (H) 07/02/2017   Lab Results  Component Value Date   MICROALBUR 4.8 (H) 01/01/2018   LDLCALC 47 02/13/2017   CREATININE 1.30 01/01/2018    Lab Results  Component Value Date   FRUCTOSAMINE 284 03/29/2017   FRUCTOSAMINE 268 12/06/2016   FRUCTOSAMINE 282 02/22/2014    OTHER active problems: See review of systems  No visits with results within 1 Week(s) from this visit.  Latest known visit with results is:  Lab on 01/01/2018  Component Date Value Ref Range Status  . Testosterone 01/01/2018 340.70  300.00 - 890.00 ng/dL Final  . Cholesterol  01/01/2018 96  0 - 200 mg/dL Final   ATP III Classification       Desirable:  < 200 mg/dL               Borderline High:  200 - 239 mg/dL          High:  > = 240 mg/dL  . Triglycerides 01/01/2018 209.0* 0.0 - 149.0 mg/dL Final   Normal:  <150 mg/dLBorderline High:  150 - 199 mg/dL  . HDL 01/01/2018 31.70* >39.00 mg/dL Final  . VLDL 01/01/2018 41.8* 0.0 - 40.0 mg/dL Final  . Total CHOL/HDL Ratio 01/01/2018 3   Final                  Men          Women1/2 Average Risk     3.4          3.3Average Risk          5.0          4.42X Average Risk          9.6          7.13X Average Risk          15.0          11.0                      . NonHDL 01/01/2018 64.61   Final   NOTE:  Non-HDL goal should be 30 mg/dL higher than patient's LDL goal (i.e. LDL goal of < 70 mg/dL, would have non-HDL goal of < 100  mg/dL)  . Sodium 01/01/2018 138  135 - 145 mEq/L Final  . Potassium 01/01/2018 4.0  3.5 - 5.1 mEq/L Final  . Chloride 01/01/2018 105  96 - 112 mEq/L Final  . CO2 01/01/2018 23  19 - 32 mEq/L Final  . Glucose, Bld 01/01/2018 133* 70 - 99 mg/dL Final  . BUN 01/01/2018 21  6 - 23 mg/dL Final  . Creatinine, Ser 01/01/2018 1.30  0.40 - 1.50 mg/dL Final  . Total Bilirubin 01/01/2018 0.5  0.2 - 1.2 mg/dL Final  . Alkaline Phosphatase 01/01/2018 75  39 - 117 U/L Final  . AST 01/01/2018 17  0 - 37 U/L Final  . ALT 01/01/2018 21  0 - 53 U/L Final  . Total Protein 01/01/2018 6.6  6.0 - 8.3 g/dL Final  . Albumin 01/01/2018 3.9  3.5 - 5.2 g/dL Final  . Calcium 01/01/2018 9.1  8.4 - 10.5 mg/dL Final  . GFR 01/01/2018 68.33  >60.00 mL/min Final  . Hgb A1c MFr Bld 01/01/2018 6.7* 4.6 - 6.5 % Final   Glycemic Control Guidelines for People with Diabetes:Non Diabetic:  <6%Goal of Therapy: <7%Additional Action Suggested:  >8%   . Microalb, Ur 01/01/2018 4.8* 0.0 - 1.9 mg/dL Final  . Creatinine,U 01/01/2018 133.4  mg/dL Final  . Microalb Creat Ratio 01/01/2018 3.6  0.0 - 30.0 mg/g Final  . Direct LDL 01/01/2018 55.0  mg/dL Final   Optimal:  <100 mg/dLNear or Above Optimal:  100-129 mg/dLBorderline High:  130-159 mg/dLHigh:  160-189 mg/dLVery High:  >190 mg/dL     Allergies as of 01/08/2018   No Known Allergies     Medication List        Accurate as of 01/08/18  8:32 AM. Always use your most recent med list.          amLODipine 10 MG tablet Commonly known as:  NORVASC TAKE 1 TABLET DAILY   aspirin 81 MG tablet Take 81 mg by mouth daily.   empagliflozin 10 MG Tabs tablet Commonly known as:  JARDIANCE Take 10 mg by mouth daily.   glimepiride 4 MG tablet Commonly known as:  AMARYL Take 4 mg by mouth daily with breakfast. Take 1/2 tablet daily   Glycopyrrolate-Formoterol 9-4.8 MCG/ACT Aero Commonly known as:  BEVESPI AEROSPHERE Inhale 2 puffs into the lungs 2 (two) times daily.     losartan 100 MG tablet Commonly known as:  COZAAR Take 1 tablet (100 mg total) by mouth daily.   metFORMIN 1000 MG tablet Commonly known as:  GLUCOPHAGE TAKE 1 TABLET TWICE A DAY WITH MEALS   metoprolol succinate 100 MG 24 hr tablet Commonly known as:  TOPROL-XL Take 1 tablet (100 mg total) by mouth daily.   multivitamin with minerals Tabs tablet Take 1 tablet by mouth daily.   simvastatin 20 MG tablet Commonly known as:  ZOCOR Take 0.5 tablets (10 mg total) by mouth at bedtime.   Testosterone 20.25 MG/ACT (1.62%) Gel Place 4 application onto the skin daily.   triamterene-hydrochlorothiazide 37.5-25 MG tablet Commonly known as:  MAXZIDE-25 Take 0.5 tablets by mouth daily.   TRULICITY 1.5 KD/3.2IZ Sopn Generic drug:  Dulaglutide INJECT 1.5 MG INTO THE SKIN WEEKLY       Allergies: No Known Allergies  Past Medical History:  Diagnosis Date  . CAD (coronary artery disease)    Two RCA stents remotely / 3rd RCA stent 2006  . Colon polyps    s/p several Cscopes.  Marland Kitchen COPD (chronic obstructive pulmonary disease) (HCC)    on O2, nocturnal  . Diabetes mellitus    dx aprox 2009  . ED (erectile dysfunction)    has a vacumm device  . Ejection fraction   . Hyperlipidemia    dx in 90s  . Hypertension    dx in the 90  . Hypogonadism male   . PVD (peripheral vascular disease) ([place])    s/p stents at LE 2009, Dr Einar Gip  . Shortness of breath    O2 Sat dropped to 82% walking on the treadmill, September, 2012    Past Surgical History:  Procedure Laterality Date  . APPENDECTOMY    . TONSILLECTOMY      Family History  Problem Relation Age of Onset  . Heart disease Father   . Hypertension Father   . Stroke Father   . Diabetes Paternal Aunt   . Diabetes Maternal Grandmother   . Diabetes Other        GM, nephews, many family members  . Hyperlipidemia Other        ?  Marland Kitchen Cancer Brother   . Colon cancer Neg Hx   . Prostate cancer Neg Hx     Social History:  reports  that he quit smoking about 40 years ago. His smoking use included cigarettes. He has a 60.00 pack-year smoking history. He has never used smokeless tobacco. He reports that he drinks alcohol. He reports that he does not use drugs.    Review of Systems        Lipids: He has been treated with simvastatin 20 mg with good control       Lab Results  Component Value Date   CHOL 96 01/01/2018   HDL 31.70 (L) 01/01/2018   LDLCALC 47 02/13/2017   LDLDIRECT 55.0 01/01/2018   TRIG 209.0 (H) 01/01/2018   CHOLHDL 3 01/01/2018  Has had erectile dysfunction for quite sometime.  Also had decreased libido previously and was found to have hypogonadism, probably in 2011.  He did have a slightly low free testosterone level in 2012 on treatment Prolactin level normal  No recent complaints of unusual fatigue  On his last visit his testosterone level was relatively high and he was told to reduce the dose to 3 pumps daily  He was told to use his upper arm instead of the hip area for  His level is now back to normal   Lab Results  Component Value Date   TESTOSTERONE 340.70 01/01/2018   Lab Results  Component Value Date   PSA 0.56 04/02/2016   PSA 0.60 01/20/2014   PSA 0.59 12/11/2011         HYPERTENSION: This is followed by PCP and is on 100mg  of losartan along with half Maxide, amlodipine and metoprolol   Maxzide reduced to half a tablet with Jardiance  Blood pressure checked at home and he thinks it is about 130/78 No lightheadedness    BP Readings from Last 3 Encounters:  01/08/18 138/84  10/10/17 138/72  08/15/17 122/68     LABS:  No visits with results within 1 Week(s) from this visit.  Latest known visit with results is:  Lab on 01/01/2018  Component Date Value Ref Range Status  . Testosterone 01/01/2018 340.70  300.00 - 890.00 ng/dL Final  . Cholesterol 01/01/2018 96  0 - 200 mg/dL Final   ATP III Classification       Desirable:  < 200 mg/dL                Borderline High:  200 - 239 mg/dL          High:  > = 240 mg/dL  . Triglycerides 01/01/2018 209.0* 0.0 - 149.0 mg/dL Final   Normal:  <150 mg/dLBorderline High:  150 - 199 mg/dL  . HDL 01/01/2018 31.70* >39.00 mg/dL Final  . VLDL 01/01/2018 41.8* 0.0 - 40.0 mg/dL Final  . Total CHOL/HDL Ratio 01/01/2018 3   Final                  Men          Women1/2 Average Risk     3.4          3.3Average Risk          5.0          4.42X Average Risk          9.6          7.13X Average Risk          15.0          11.0                      . NonHDL 01/01/2018 64.61   Final   NOTE:  Non-HDL goal should be 30 mg/dL higher than patient's LDL goal (i.e. LDL goal of < 70 mg/dL, would have non-HDL goal of < 100 mg/dL)  . Sodium 01/01/2018 138  135 - 145 mEq/L Final  . Potassium 01/01/2018 4.0  3.5 - 5.1 mEq/L Final  . Chloride 01/01/2018 105  96 - 112 mEq/L Final  . CO2 01/01/2018 23  19 - 32 mEq/L Final  . Glucose, Bld 01/01/2018 133* 70 - 99 mg/dL Final  . BUN 01/01/2018 21  6 - 23 mg/dL Final  . Creatinine, Ser 01/01/2018 1.30  0.40 -  1.50 mg/dL Final  . Total Bilirubin 01/01/2018 0.5  0.2 - 1.2 mg/dL Final  . Alkaline Phosphatase 01/01/2018 75  39 - 117 U/L Final  . AST 01/01/2018 17  0 - 37 U/L Final  . ALT 01/01/2018 21  0 - 53 U/L Final  . Total Protein 01/01/2018 6.6  6.0 - 8.3 g/dL Final  . Albumin 01/01/2018 3.9  3.5 - 5.2 g/dL Final  . Calcium 01/01/2018 9.1  8.4 - 10.5 mg/dL Final  . GFR 01/01/2018 68.33  >60.00 mL/min Final  . Hgb A1c MFr Bld 01/01/2018 6.7* 4.6 - 6.5 % Final   Glycemic Control Guidelines for People with Diabetes:Non Diabetic:  <6%Goal of Therapy: <7%Additional Action Suggested:  >8%   . Microalb, Ur 01/01/2018 4.8* 0.0 - 1.9 mg/dL Final  . Creatinine,U 01/01/2018 133.4  mg/dL Final  . Microalb Creat Ratio 01/01/2018 3.6  0.0 - 30.0 mg/g Final  . Direct LDL 01/01/2018 55.0  mg/dL Final   Optimal:  <100 mg/dLNear or Above Optimal:  100-129 mg/dLBorderline High:  130-159  mg/dLHigh:  160-189 mg/dLVery High:  >190 mg/dL    Physical Examination:  BP 138/84 (BP Location: Left Arm, Patient Position: Sitting, Cuff Size: Normal)   Pulse 74   Ht 6\' 2"  (1.88 m)   Wt 242 lb 9.6 oz (110 kg)   SpO2 98%   BMI 31.15 kg/m       ASSESSMENT/PLAN:   Diabetes type 2, with obesity See history of present illness for discussion of current diabetes management, blood sugar patterns and problems identified  His blood sugars are excellent A1c 6.7 He has fairly good blood sugar throughout the day although relatively lower in the afternoon and may be higher occasionally after meals and fasting  He will continue his regimen including Trulicity and Jardiance but also try to take half tablet twice a day of the Amaryl for more even control  HYPERTENSION: Blood pressure is well-controlled, relatively better at home  Hypogonadism: He will continue 3 pumps a day and applied to the upper arm consistently as his level is adequate for his age Would recommend checking PSA and CBC with his primary care doctor on his physical next month  Follow-up in 4 months  There are no Patient Instructions on file for this visit.    Elayne Snare 01/08/2018, 8:32 AM   Note: This office note was prepared with Dragon voice recognition system technology. Any transcriptional errors that result from this process are unintentional.

## 2018-01-08 ENCOUNTER — Encounter: Payer: Self-pay | Admitting: Endocrinology

## 2018-01-08 ENCOUNTER — Ambulatory Visit (INDEPENDENT_AMBULATORY_CARE_PROVIDER_SITE_OTHER): Payer: Medicare Other | Admitting: Endocrinology

## 2018-01-08 VITALS — BP 138/84 | HR 74 | Ht 74.0 in | Wt 242.6 lb

## 2018-01-08 DIAGNOSIS — E291 Testicular hypofunction: Secondary | ICD-10-CM

## 2018-01-08 DIAGNOSIS — E119 Type 2 diabetes mellitus without complications: Secondary | ICD-10-CM | POA: Diagnosis not present

## 2018-01-08 NOTE — Patient Instructions (Addendum)
Glimeperide 1/2 twice daily  Check blood sugars on waking up  2-3/7  Also check blood sugars about 2 hours after a meal and do this after different meals by rotation  Recommended blood sugar levels on waking up is 90-130 and about 2 hours after meal is 130-160  Please bring your blood sugar monitor to each visit, thank you

## 2018-01-20 ENCOUNTER — Telehealth: Payer: Self-pay | Admitting: Endocrinology

## 2018-01-20 ENCOUNTER — Other Ambulatory Visit: Payer: Self-pay

## 2018-01-20 MED ORDER — FREESTYLE LANCETS MISC: 1 refills | Status: DC

## 2018-01-20 NOTE — Telephone Encounter (Signed)
Patient need a refill of his lancets, [facility]

## 2018-01-20 NOTE — Telephone Encounter (Signed)
Mediations have been sent to [facility]

## 2018-01-27 ENCOUNTER — Other Ambulatory Visit: Payer: Self-pay

## 2018-01-27 ENCOUNTER — Telehealth: Payer: Self-pay | Admitting: Endocrinology

## 2018-01-27 MED ORDER — FREESTYLE LITE DEVI: 1.0000 | Freq: Every day | Status: DC

## 2018-01-27 MED ORDER — FREESTYLE LITE DEVI
1.0000 | Freq: Every day | Status: DC
Start: 2018-01-27 — End: 2018-01-27

## 2018-01-27 MED ORDER — GLUCOSE BLOOD VI STRP: ORAL_STRIP | 12 refills | Status: DC

## 2018-01-27 NOTE — Telephone Encounter (Signed)
Script was sent via fax to [facility] with MD signature, date next to signature, as well as diagnosis code.

## 2018-01-27 NOTE — Telephone Encounter (Signed)
Patient would like to know if we have a freestyle lite meter in the office he can pick up or if we can send in a prescription for one to his pharmacy. He stated his meter is broken.     [place] (SE), [place]

## 2018-01-28 ENCOUNTER — Other Ambulatory Visit: Payer: Self-pay

## 2018-01-28 MED ORDER — GLUCOSE BLOOD VI STRP: ORAL_STRIP | 12 refills | Status: DC

## 2018-02-08 ENCOUNTER — Other Ambulatory Visit: Payer: Self-pay | Admitting: Internal Medicine

## 2018-02-14 ENCOUNTER — Telehealth: Payer: Self-pay | Admitting: *Deleted

## 2018-02-14 ENCOUNTER — Ambulatory Visit (INDEPENDENT_AMBULATORY_CARE_PROVIDER_SITE_OTHER): Payer: Medicare Other | Admitting: Internal Medicine

## 2018-02-14 ENCOUNTER — Encounter: Payer: Self-pay | Admitting: Internal Medicine

## 2018-02-14 VITALS — BP 136/72 | HR 71 | Temp 98.2°F | Resp 14 | Ht 74.0 in | Wt 244.1 lb

## 2018-02-14 DIAGNOSIS — R3912 Poor urinary stream: Secondary | ICD-10-CM

## 2018-02-14 DIAGNOSIS — D751 Secondary polycythemia: Secondary | ICD-10-CM | POA: Diagnosis not present

## 2018-02-14 DIAGNOSIS — I10 Essential (primary) hypertension: Secondary | ICD-10-CM

## 2018-02-14 DIAGNOSIS — J449 Chronic obstructive pulmonary disease, unspecified: Secondary | ICD-10-CM

## 2018-02-14 DIAGNOSIS — I739 Peripheral vascular disease, unspecified: Secondary | ICD-10-CM

## 2018-02-14 DIAGNOSIS — I2510 Atherosclerotic heart disease of native coronary artery without angina pectoris: Secondary | ICD-10-CM

## 2018-02-14 DIAGNOSIS — E291 Testicular hypofunction: Secondary | ICD-10-CM

## 2018-02-14 LAB — CBC WITH DIFFERENTIAL/PLATELET
Basophils Absolute: 0.1 10*3/uL (ref 0.0–0.1)
Basophils Relative: 1.3 % (ref 0.0–3.0)
EOS PCT: 4.4 % (ref 0.0–5.0)
Eosinophils Absolute: 0.3 10*3/uL (ref 0.0–0.7)
HCT: 56.2 % — ABNORMAL HIGH (ref 39.0–52.0)
Hemoglobin: 18.8 g/dL (ref 13.0–17.0)
LYMPHS ABS: 2.7 10*3/uL (ref 0.7–4.0)
Lymphocytes Relative: 38.5 % (ref 12.0–46.0)
MCHC: 33.4 g/dL (ref 30.0–36.0)
MCV: 100.5 fl — AB (ref 78.0–100.0)
MONO ABS: 0.9 10*3/uL (ref 0.1–1.0)
MONOS PCT: 12.8 % — AB (ref 3.0–12.0)
NEUTROS PCT: 43 % (ref 43.0–77.0)
Neutro Abs: 3 10*3/uL (ref 1.4–7.7)
PLATELETS: 174 10*3/uL (ref 150.0–400.0)
RBC: 5.59 Mil/uL (ref 4.22–5.81)
RDW: 15.6 % — ABNORMAL HIGH (ref 11.5–15.5)
WBC: 6.9 10*3/uL (ref 4.0–10.5)

## 2018-02-14 LAB — PSA: PSA: 0.6 ng/mL (ref 0.10–4.00)

## 2018-02-14 NOTE — Progress Notes (Signed)
Subjective:    Patient ID: Jonathan [name]., male    DOB: Oct 12, 1935, 82 y.o.   MRN: [id number]  DOS:  02/14/2018 Type of visit - description : rov Interval history: DM: Good compliance with medication, last A1c 6.7.  Januvia was on his medication list but he has not taken it. HTN: Good compliance with meds, normal ambulatory BPs CAD: Good med compliance, asymptomatic. COPD: Not using oxygen every night.  Review of Systems Denies chest pain, difficulty breathing or claudication.  No DOE  no nausea, vomiting, diarrhea.  No red blood per rectum Denies gross hematuria or dysuria.  Occasionally his urinary flow is a slow  Past Medical History:  Diagnosis Date  . CAD (coronary artery disease)    Two RCA stents remotely / 3rd RCA stent 2006  . Colon polyps    s/p several Cscopes.  Marland Kitchen COPD (chronic obstructive pulmonary disease) (HCC)    on O2, nocturnal  . Diabetes mellitus    dx aprox 2009  . ED (erectile dysfunction)    has a vacumm device  . Ejection fraction   . Hyperlipidemia    dx in 90s  . Hypertension    dx in the 90  . Hypogonadism male   . PVD (peripheral vascular disease) ([place])    s/p stents at LE 2009, Dr Einar Gip  . Shortness of breath    O2 Sat dropped to 82% walking on the treadmill, September, 2012    Past Surgical History:  Procedure Laterality Date  . APPENDECTOMY    . TONSILLECTOMY      Social History   Socioeconomic History  . Marital status: Married    Spouse name: Cerrella   . Number of children: 6  . Years of education: Not on file  . Highest education level: Not on file  Occupational History  . 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 Financial resource strain: Not on file  . Food insecurity:    Worry: Not on file    Inability: Not on file  . Transportation needs:    Medical: Not on file    Non-medical: Not on file  Tobacco Use  . Smoking status: Former Smoker    Packs/day: 2.00    Years: 30.00     Pack years: 60.00    Types: Cigarettes    Last attempt to quit: 06/17/1977    Years since quitting: 40.6  . Smokeless tobacco: Never Used  . Tobacco comment: 2 ppd, quit 1987  Substance and Sexual Activity  . Alcohol use: Yes    Alcohol/week: 0.0 oz    Comment: socially   . Drug use: No  . Sexual activity: Yes  Lifestyle  . Physical activity:    Days per week: Not on file    Minutes per session: Not on file  . Stress: Not on file  Relationships  . Social connections:    Talks on phone: Not on file    Gets together: Not on file    Attends religious service: Not on file    Active member of club or organization: Not on file    Attends meetings of clubs or organizations: Not on file    Relationship status: Not on file  . Intimate partner violence:    Fear of current or ex partner: Not on file    Emotionally abused: Not on file    Physically abused: Not on file    Forced  sexual activity: Not on file  Other Topics Concern  . Not on file  Social History Narrative   4 children (lost 1 son)   Wife has 2 children                   Allergies as of 02/14/2018   No Known Allergies     Medication List        Accurate as of 02/14/18 11:59 PM. Always use your most recent med list.          amLODipine 10 MG tablet Commonly known as:  NORVASC TAKE 1 TABLET DAILY   aspirin 81 MG tablet Take 81 mg by mouth daily.   empagliflozin 10 MG Tabs tablet Commonly known as:  JARDIANCE Take 10 mg by mouth daily.   freestyle lancets USE AS INSTRUCTED TO CHECK BLOOD SUGAR TWICE A DAY   FREESTYLE LITE Devi 1 each by Does not apply route daily. Use as instructed to check blood sugar twice daily.  DX: E11.9   glimepiride 4 MG tablet Commonly known as:  AMARYL Take 4 mg by mouth daily with breakfast. Take 1/2 tablet daily   glucose blood test strip Commonly known as:  FREESTYLE LITE Use as instructed to check blood sugar twice daily.   Glycopyrrolate-Formoterol 9-4.8  MCG/ACT Aero Commonly known as:  BEVESPI AEROSPHERE Inhale 2 puffs into the lungs 2 (two) times daily.   losartan 100 MG tablet Commonly known as:  COZAAR Take 1 tablet (100 mg total) by mouth daily.   metFORMIN 1000 MG tablet Commonly known as:  GLUCOPHAGE TAKE 1 TABLET TWICE A DAY WITH MEALS   metoprolol succinate 100 MG 24 hr tablet Commonly known as:  TOPROL XL Take 1 tablet (100 mg total) by mouth daily.   multivitamin with minerals Tabs tablet Take 1 tablet by mouth daily.   simvastatin 20 MG tablet Commonly known as:  ZOCOR Take 0.5 tablets (10 mg total) by mouth at bedtime.   Testosterone 20.25 MG/ACT (1.62%) Gel Place 4 application onto the skin daily.   triamterene-hydrochlorothiazide 37.5-25 MG tablet Commonly known as:  MAXZIDE-25 Take 0.5 tablets by mouth daily.   TRULICITY 1.5 OZ/3.6UY Sopn Generic drug:  Dulaglutide INJECT 1.5 MG INTO THE SKIN WEEKLY          Objective:   Physical Exam BP 136/72 (BP Location: Left Arm, Patient Position: Sitting, Cuff Size: Normal)   Pulse 71   Temp 98.2 F (36.8 C) (Oral)   Resp 14   Ht 6\' 2"  (1.88 m)   Wt 244 lb 2 oz (110.7 kg)   SpO2 90%   BMI 31.34 kg/m  General:   Well developed, well nourished . NAD.  Neck: No  thyromegaly  HEENT:  Normocephalic . Face symmetric, atraumatic Lungs:  CTA B Normal respiratory effort, no intercostal retractions, no accessory muscle use. Heart: RRR,  no murmur.  No pretibial edema bilaterally  Abdomen:  Not distended, soft, non-tender. No rebound or rigidity.   Skin: Exposed areas without rash. Not pale. Not jaundice Rectal: External abnormalities: none. Normal sphincter tone. No rectal masses or tenderness.  Brown stools Prostate: Prostate gland firm and smooth, no enlargement, nodularity, tenderness, mass, asymmetry or induration Neurologic:  alert & oriented X3.  Speech normal, gait appropriate for age and unassisted Strength symmetric and appropriate for age.    Psych: Cognition and judgment appear intact.  Cooperative with normal attention span and concentration.  Behavior appropriate. No anxious or depressed appearing.  Assessment & Plan:   Assessment DM dx ~9030, complicated by CAD, PVD, mild neuropathy. Sees Dr. Dwyane Dee Hypogonadism. Sees Dr. Dwyane Dee HTN Hyperlipidemia COPD, nocturnal O2 prn Back pain, chronic: See visit 10/12/2016 CV: -CAD, Dr. Ron Parker Dr Meda Coffee S/p  stenting 2 to the RCA remote past and the third RCA stent in  2006, negative stress test in 2012, LVEF 55% on echo 2015 -Peripheral vascular disease, stents lower extremity 2009  ED  PLAN    HTN: Continue amlodipine, losartan, metoprolol, Maxide.  Last BMP satisfactory.  Normal ambulatory BPs DM: Per endocrinology, currently on Jardiance, glimepiride, metformin, Trulicity. Hypogonadism: On HRT. LUTS: Occasional difficulty urinating.  Check PSA COPD: Not using oxygen regularly at night, encouraged to do. CAD, PVD: asx, chart reviewed, has not seen cardiology in a while, refer back to them prn; plan is to control CV RF Anxiety: Wife is having some behavioral issues, and listen to his concerns, counseled. Preventive care reviewed. RTC 8 months   Today, I spent more than  25  min with the patient: >50% of the time counseling regards anxiety, concerns about his wife, also reviewing the chart and labs ordered by other providers

## 2018-02-14 NOTE — Telephone Encounter (Signed)
CRITICAL VALUE STICKER  CRITICAL VALUE:Hgb:18.8  RECEIVER (on-site recipient of call):Angie  DATE & TIME NOTIFIED:02/14/18 @ 12:24pm   MESSENGER (representative from lab):Karen-Elam  MD NOTIFIED:Dr. Larose Kells  TIME OF NOTIFICATION:1:11pm (Dr. Larose Kells was in a provider's meeting.)  RESPONSE:Dr. Larose Kells asked for the critical lab information to be sent to him.//AB/CMA

## 2018-02-14 NOTE — Patient Instructions (Signed)
GO TO THE LAB : Get the blood work     GO TO THE FRONT DESK Schedule your next appointment for a   checkup in 8 months 

## 2018-02-14 NOTE — Assessment & Plan Note (Addendum)
-  Td 2016;  Pneumonia shot: 2011; prevnar 2015; zostavax-- states he got it at [facility], no records  -CCS: Colonoscopy-- h/o polyps in colon, s/p several Cscopes per pt, last  Colonoscopy 12/06/2014, no polyps, next in 7 years -DRE today normal, checking a PSA

## 2018-02-14 NOTE — Telephone Encounter (Signed)
Noted, thx.

## 2018-02-14 NOTE — Progress Notes (Signed)
Pre visit review using our clinic review tool, if applicable. No additional management support is needed unless otherwise documented below in the visit note. 

## 2018-02-15 NOTE — Assessment & Plan Note (Signed)
HTN: Continue amlodipine, losartan, metoprolol, Maxide.  Last BMP satisfactory.  Normal ambulatory BPs DM: Per endocrinology, currently on Jardiance, glimepiride, metformin, Trulicity. Hypogonadism: On HRT. LUTS: Occasional difficulty urinating.  Check PSA COPD: Not using oxygen regularly at night, encouraged to do. CAD, PVD: asx, chart reviewed, has not seen cardiology in a while, refer back to them prn; plan is to control CV RF Anxiety: Wife is having some behavioral issues, and listen to his concerns, counseled. Preventive care reviewed. RTC 8 months

## 2018-02-17 NOTE — Addendum Note (Signed)
Addended byDamita Dunnings D on: 02/17/2018 05:12 PM   Modules accepted: Orders

## 2018-03-05 ENCOUNTER — Telehealth: Payer: Self-pay | Admitting: *Deleted

## 2018-03-05 NOTE — Telephone Encounter (Signed)
Received request for last OV notes from [facility]/Dr. Henreitta Cea for continue of care; faxed to [phone]/[facility] 06/19

## 2018-03-17 ENCOUNTER — Other Ambulatory Visit: Payer: Self-pay | Admitting: Endocrinology

## 2018-03-24 NOTE — Progress Notes (Signed)
Cardiology Office Note    Date:  03/25/2018   ID:  [name], DOB Nov 01, 1935, MRN [id number]  PCP:  Colon Branch, MD  Cardiologist: Ena Dawley, MD  Chief Complaint  Patient presents with  . Follow-up    History of Present Illness:  Jonathan [name]. is a 82 y.o. male with history of CAD status post stenting x2 to the RCA in the remote past and a third RCA stent in 2006, negative stress test 2012, LVEF 55% on echo in 2015.  History of hypertension, HLD, diabetes mellitus, COPD on home O2, PVD status post stents of lower extremities in 2009.  I last saw the patient 01/15/2017 at which time he was working as a Recruitment consultant and exercising regularly.BP was high that day but he had salami for breakfast and blood pressure is usually well controlled so I did not change his medications.  Patient comes in for yearly f/u. Yesterday while driving his wife to an appt he had a sharp shooting pain in left chest that went away quickly.  He says it was different than his chest pain prior to his stents.  He denies any chest tightness, pressure, radiation of pain, dyspnea, dizziness or presyncope.  He has chronic dyspnea on exertion that has not changed.  He is a former smoker.  Who remains active exercising 3 days a week doing rowing machines and other exercises.  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  He is leaving for [facility] today.  Labs reviewed from April and all look good except slightly elevated triglycerides.  He says he enjoys ice cream and eats a lot of candy and raisins.  Hemoglobin A1c was 6.7.  Plavix stopped last year by PCP because of GI bleed.   Past Medical History:  Diagnosis Date  . CAD (coronary artery disease)    Two RCA stents remotely / 3rd RCA stent 2006  . Colon polyps    s/p several Cscopes.  Marland Kitchen COPD (chronic obstructive pulmonary disease) (HCC)    on O2, nocturnal  . Diabetes mellitus    dx aprox 2009  . ED  (erectile dysfunction)    has a vacumm device  . Ejection fraction   . Hyperlipidemia    dx in 90s  . Hypertension    dx in the 90  . Hypogonadism male   . PVD (peripheral vascular disease) ([place])    s/p stents at LE 2009, Dr Einar Gip  . Shortness of breath    O2 Sat dropped to 82% walking on the treadmill, September, 2012    Past Surgical History:  Procedure Laterality Date  . APPENDECTOMY    . TONSILLECTOMY      Current Medications: Current Meds  Medication Sig  . amLODipine (NORVASC) 10 MG tablet TAKE 1 TABLET DAILY  . aspirin 81 MG tablet Take 81 mg by mouth daily.    . Blood Glucose Monitoring Suppl (FREESTYLE LITE) DEVI 1 each by Does not apply route daily. Use as instructed to check blood sugar twice daily.  DX: E11.9  . empagliflozin (JARDIANCE) 10 MG TABS tablet Take 10 mg by mouth daily.  Marland Kitchen glimepiride (AMARYL) 4 MG tablet Take 4 mg by mouth daily with breakfast. Take 1/2 tablet daily  . glucose blood (FREESTYLE LITE) test strip Use as instructed to check blood sugar twice daily.  . Glycopyrrolate-Formoterol (BEVESPI AEROSPHERE) 9-4.8 MCG/ACT AERO Inhale 2 puffs into the lungs 2 (  two) times daily.  . Lancets (FREESTYLE) lancets USE AS INSTRUCTED TO CHECK BLOOD SUGAR TWICE A DAY  . losartan (COZAAR) 100 MG tablet Take 1 tablet (100 mg total) by mouth daily.  . metFORMIN (GLUCOPHAGE) 1000 MG tablet Take 1,000 mg by mouth daily.  . metoprolol succinate (TOPROL XL) 100 MG 24 hr tablet Take 1 tablet (100 mg total) by mouth daily.  . Multiple Vitamin (MULTIVITAMIN WITH MINERALS) TABS tablet Take 1 tablet by mouth daily.  . simvastatin (ZOCOR) 20 MG tablet Take 0.5 tablets (10 mg total) by mouth at bedtime.  . Testosterone 20.25 MG/ACT (1.62%) GEL Place 4 application onto the skin daily. (Patient taking differently: Place 3 application onto the skin daily. )  . triamterene-hydrochlorothiazide (MAXZIDE-25) 37.5-25 MG tablet Take 0.5 tablets by mouth daily.  . TRULICITY 1.5  JJ/0.0XF SOPN INJECT 1.5 MG INTO THE SKIN WEEKLY     Allergies:   Patient has no known allergies.   Social History   Socioeconomic History  . Marital status: Married    Spouse name: Cerrella   . Number of children: 6  . Years of education: Not on file  . Highest education level: Not on file  Occupational History  . 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 Financial resource strain: Not on file  . Food insecurity:    Worry: Not on file    Inability: Not on file  . Transportation needs:    Medical: Not on file    Non-medical: Not on file  Tobacco Use  . Smoking status: Former Smoker    Packs/day: 2.00    Years: 30.00    Pack years: 60.00    Types: Cigarettes    Last attempt to quit: 06/17/1977    Years since quitting: 40.7  . Smokeless tobacco: Never Used  . Tobacco comment: 2 ppd, quit 1987  Substance and Sexual Activity  . Alcohol use: Yes    Alcohol/week: 0.0 oz    Comment: socially   . Drug use: No  . Sexual activity: Yes  Lifestyle  . Physical activity:    Days per week: Not on file    Minutes per session: Not on file  . Stress: Not on file  Relationships  . Social connections:    Talks on phone: Not on file    Gets together: Not on file    Attends religious service: Not on file    Active member of club or organization: Not on file    Attends meetings of clubs or organizations: Not on file    Relationship status: Not on file  Other Topics Concern  . Not on file  Social History Narrative   4 children (lost 1 son)   Wife has 2 children                  Family History:  The patient's family history includes Diabetes in his maternal grandmother, other, and paternal aunt; Heart disease in his father; Hyperlipidemia in his other; Hypertension in his father; Prostate cancer in his brother; Stroke in his father.   ROS:   Please see the history of present illness.    Review of Systems  Constitution: Negative.  HENT:  Negative.   Cardiovascular: Positive for dyspnea on exertion.  Respiratory: Negative.   Endocrine: Negative.   Hematologic/Lymphatic: Negative.   Musculoskeletal: Negative.   Gastrointestinal: Negative.   Genitourinary: Negative.   Neurological: Negative.    All  other systems reviewed and are negative.   PHYSICAL EXAM:   VS:  BP 140/80 (BP Location: Left Arm, Patient Position: Sitting, Cuff Size: Normal)   Pulse 77   Ht 6\' 2"  (1.88 m)   Wt 239 lb 9.6 oz (108.7 kg)   BMI 30.76 kg/m   Physical Exam  GEN: Well nourished, well developed, looks much younger than his stated age, in no acute distress  Neck: no JVD, carotid bruits, or masses Cardiac:RRR; no murmurs, rubs, or gallops  Respiratory:  clear to auscultation bilaterally, normal work of breathing GI: soft, nontender, nondistended, + BS Ext: without cyanosis, clubbing, or edema, Good distal pulses bilaterally Neuro:  Alert and Oriented x 3 Psych: euthymic mood, full affect  Wt Readings from Last 3 Encounters:  03/25/18 239 lb 9.6 oz (108.7 kg)  02/14/18 244 lb 2 oz (110.7 kg)  01/08/18 242 lb 9.6 oz (110 kg)      Studies/Labs Reviewed:   EKG:  EKG is  ordered today.  The ekg ordered today demonstrates normal sinus rhythm at 77 bpm with first-degree AV block similar to last year.  Recent Labs: 01/01/2018: ALT 21; BUN 21; Creatinine, Ser 1.30; Potassium 4.0; Sodium 138 02/14/2018: Hemoglobin 18.8 Repeated and verified X2.; Platelets 174.0   Lipid Panel    Component Value Date/Time   CHOL 96 01/01/2018 0808   TRIG 209.0 (H) 01/01/2018 0808   HDL 31.70 (L) 01/01/2018 0808   CHOLHDL 3 01/01/2018 0808   VLDL 41.8 (H) 01/01/2018 0808   LDLCALC 47 02/13/2017 0821   LDLDIRECT 55.0 01/01/2018 0808    Additional studies/ records that were reviewed today include:  2-D echo 2015Study Conclusions  - Left ventricle: The cavity size was normal. Wall thickness   was normal. Systolic function was normal. The estimated    ejection fraction was 50%, in the range of 50% to 55%.   Wall motion was normal; there were no regional wall motion   abnormalities. Doppler parameters are consistent with   abnormal left ventricular relaxation (grade 1 diastolic   dysfunction). - Ascending aorta: The ascending aorta was mildly dilated. - Left atrium: The atrium was mildly dilated. Impressions:  - Low normal LV function; RV function normal; no TR jet   available to estimate pulmonary pressures. Transthoracic echocardiography.  M-mode, complete 2D,         ASSESSMENT:    1. Coronary artery disease involving native coronary artery of native heart without angina pectoris   2. Essential hypertension   3. Mixed hyperlipidemia   4. COPD mixed type [facility])      PLAN:  In order of problems listed above:  CAD status post remote stents to the RCA x2, third stent to the RCA in 2009 on aspirin.  Plavix stopped 02/27/2017 by Dr. Larose Kells as secondary to GI bleeding.  He has had no further angina.  Sharp shooting pain yesterday was atypical.  He is to call if he has any recurrent chest pain.  Follow-up with Dr. Meda Coffee in 1 year.  Essential hypertension blood pressure well controlled  Mixed hyperlipidemia on Zocor-lipid profile in April LDL at goal triglycerides elevated.  Patient says he will work on his diet and decrease ice cream and candy that he is eating.  COPD     Medication Adjustments/Labs and Tests Ordered: Current medicines are reviewed at length with the patient today.  Concerns regarding medicines are outlined above.  Medication changes, Labs and Tests ordered today are listed in the Patient Instructions below.  Patient Instructions  Medication Instructions:   Your physician recommends that you continue on your current medications as directed. Please refer to the Current Medication list given to you today.   If you need a refill on your cardiac medications before your next appointment, please call your  pharmacy.  Labwork:NONE ORDERED  TODAY     Testing/Procedures: NONE ORDERED  TODAY    Follow-Up:  Your physician wants you to follow-up in: [place] will receive a reminder letter in the mail two months in advance. If you don't receive a letter, please call our office to schedule the follow-up appointment.     Any Other Special Instructions Will Be Listed Below (If Applicable).                                                                                                                                                      Sumner Boast, PA-C  03/25/2018 8:35 AM    [facility] [address], [place], [place]  [address] Phone: [phone]; Fax: [phone]

## 2018-03-25 ENCOUNTER — Encounter: Payer: Self-pay | Admitting: Physician Assistant

## 2018-03-25 ENCOUNTER — Ambulatory Visit (INDEPENDENT_AMBULATORY_CARE_PROVIDER_SITE_OTHER): Payer: Medicare Other | Admitting: Physician Assistant

## 2018-03-25 VITALS — BP 140/80 | HR 77 | Ht 74.0 in | Wt 239.6 lb

## 2018-03-25 DIAGNOSIS — I10 Essential (primary) hypertension: Secondary | ICD-10-CM

## 2018-03-25 DIAGNOSIS — E782 Mixed hyperlipidemia: Secondary | ICD-10-CM

## 2018-03-25 DIAGNOSIS — I2510 Atherosclerotic heart disease of native coronary artery without angina pectoris: Secondary | ICD-10-CM

## 2018-03-25 DIAGNOSIS — J449 Chronic obstructive pulmonary disease, unspecified: Secondary | ICD-10-CM | POA: Diagnosis not present

## 2018-03-25 NOTE — Patient Instructions (Signed)
Medication Instructions:   Your physician recommends that you continue on your current medications as directed. Please refer to the Current Medication list given to you today.   If you need a refill on your cardiac medications before your next appointment, please call your pharmacy.  Labwork:NONE ORDERED  TODAY     Testing/Procedures: NONE ORDERED  TODAY    Follow-Up:  Your physician wants you to follow-up in: [place] will receive a reminder letter in the mail two months in advance. If you don't receive a letter, please call our office to schedule the follow-up appointment.     Any Other Special Instructions Will Be Listed Below (If Applicable).

## 2018-04-08 ENCOUNTER — Ambulatory Visit (INDEPENDENT_AMBULATORY_CARE_PROVIDER_SITE_OTHER): Payer: Medicare Other | Admitting: Internal Medicine

## 2018-04-08 ENCOUNTER — Encounter: Payer: Self-pay | Admitting: Internal Medicine

## 2018-04-08 VITALS — BP 122/64 | HR 87 | Ht 74.0 in | Wt 239.4 lb

## 2018-04-08 DIAGNOSIS — J9611 Chronic respiratory failure with hypoxia: Secondary | ICD-10-CM

## 2018-04-08 DIAGNOSIS — D751 Secondary polycythemia: Secondary | ICD-10-CM

## 2018-04-08 DIAGNOSIS — J449 Chronic obstructive pulmonary disease, unspecified: Secondary | ICD-10-CM | POA: Diagnosis not present

## 2018-04-08 DIAGNOSIS — I2510 Atherosclerotic heart disease of native coronary artery without angina pectoris: Secondary | ICD-10-CM | POA: Diagnosis not present

## 2018-04-08 NOTE — Assessment & Plan Note (Signed)
We will recheck his oxygen status and PFT because of his polycythemia. Plan-overnight oximetry on 2 L O2, schedule PFT, encourage use of oxygen all night every night.

## 2018-04-08 NOTE — Progress Notes (Signed)
HPI male former smoker followed for COPD, chronic respiratory failure with hypoxia, complicated by CAD/MI/ Stents/ PAD, DM2 PFT- 07/10/11-  Normal spirometry flows with small- airway response to bronchodilator, normal lung volumes and moderately reduced diffusion. FEV1/FVC 0.79, DLCO 58% 6MWT -06/18/12- 96%, 88%, 94% 449 M. Significant desaturation with exercise. Overnight Oximetry 02/07/17-room air-sustained desaturation less than or equal to 88% for over 7 hours -----------------------------------------------------------------------------------------------------------------------------  07/02/17- 82 year old male former smoker followed for COPD, chronic respiratory failure with hypoxia,, complicated by CAD/MI/ stents/ PAD, DM2, polycythemia O2 2 L/APS-sleep and exertion Pt had flu shot 06/04/2017. Pt is doing well overall He paces himself. Not much cough and no sputum. No chest pain. Easy dyspnea on exertion. We qualified for home oxygen June 14. DME is sending some forms that need. He didn't like [facility]. Gets most meds through [place]. Had flu shot. Not using Spiriva regularly now.  04/08/2018- 82 year old male former smoker followed for COPD, chronic respiratory failure with hypoxia,, complicated by CAD/MI/ stents/ PAD, DM2, polycythemia O2 2 L/APS-sleep and exertion Polycythemia with hemoglobin 18.8 recorded May/31st.  On testosterone. He was not aware of elevated hemoglobin-we discussed.  Admits he "sometimes" does not wear his oxygen at night.  Still uses Spiriva.  Little cough, no wheeze and not much aware of dyspnea on exertion.  Mentions cramps right medial thigh recently with history of previous stent.  He will discuss this with his cardiologist.  ROS-see HPI  + = positive Constitutional:   No-   weight loss, night sweats, fevers, chills, fatigue, lassitude. HEENT:   No-  headaches, difficulty swallowing, tooth/dental problems, sore throat,       No-  sneezing, itching, ear ache, nasal  congestion, post nasal drip,  CV:  No-   chest pain, orthopnea, PND, swelling in lower extremities, anasarca, dizziness, palpitations,                 ? Claudication R thigh Resp:   + "shortness of breath with exertion or at rest.              No-   productive cough,  No non-productive cough,  No- coughing up of blood.              No-   change in color of mucus.   wheezing.   Skin: No-   rash or lesions. GI:  No-   heartburn, indigestion, abdominal pain, nausea, vomiting, GU:  MS:  No-   joint pain or swelling.   Neuro-     nothing unusual Psych:  No- change in mood or affect. No depression or anxiety.  No memory loss.  OBJ- Physical Exam     General- Alert, Oriented, Affect-appropriate, Distress- none acute.  Skin- rash-none, lesions- none, excoriation- none Lymphadenopathy- none Head- atraumatic            Eyes- Gross vision intact, PERRLA, conjunctivae and secretions clear            Ears- Hearing, canals-normal            Nose- Clear, no-Septal dev, mucus, polyps, erosion, perforation             Throat- Mallampati II , mucosa clear , drainage- none, tonsils- atrophic Neck- flexible , trachea midline, no stridor , thyroid nl, carotid no bruit Chest - symmetrical excursion , unlabored           Heart/CV- RRR , no murmur , no gallop  , no rub, nl s1 s2                           -  JVD+ 1 cm , edema- none, stasis changes- none, varices- none           Lung- + distant/quiet/ unlabored, cough-none , dullness-none, rub- none, + slightly breathless on                  conversation with room air           Chest wall-  Abd-  Br/ Gen/ Rectal- Not done, not indicated Extrem- cyanosis- none,  atrophy- none, strength- nl  Neuro- grossly intact to observation

## 2018-04-08 NOTE — Patient Instructions (Addendum)
Order- schedule PFT    Dx COPD mixed type  Order- ONOX on 2 L O2 during sleep   Dx polycythemia  Please call as needed

## 2018-04-08 NOTE — Assessment & Plan Note (Signed)
He uses Spiriva, but  has little wheeze or cough and probably not much reactive airways disease.

## 2018-04-23 DIAGNOSIS — R0602 Shortness of breath: Secondary | ICD-10-CM | POA: Diagnosis not present

## 2018-04-24 ENCOUNTER — Encounter: Payer: Self-pay | Admitting: Internal Medicine

## 2018-04-29 DIAGNOSIS — R0602 Shortness of breath: Secondary | ICD-10-CM | POA: Diagnosis not present

## 2018-04-30 ENCOUNTER — Encounter: Payer: Self-pay | Admitting: Internal Medicine

## 2018-04-30 ENCOUNTER — Ambulatory Visit: Payer: Medicare Other | Admitting: Family

## 2018-04-30 NOTE — Telephone Encounter (Signed)
Error

## 2018-05-01 ENCOUNTER — Telehealth: Payer: Self-pay | Admitting: Internal Medicine

## 2018-05-01 DIAGNOSIS — J9611 Chronic respiratory failure with hypoxia: Secondary | ICD-10-CM

## 2018-05-01 NOTE — Telephone Encounter (Signed)
Pt is aware of change in O2 liter flow-order placed to [place] as well. Nothing more needed at this time.

## 2018-05-02 ENCOUNTER — Ambulatory Visit (INDEPENDENT_AMBULATORY_CARE_PROVIDER_SITE_OTHER): Payer: Medicare Other | Admitting: Family

## 2018-05-02 ENCOUNTER — Encounter: Payer: Self-pay | Admitting: Family

## 2018-05-02 VITALS — BP 117/87 | HR 70 | Temp 97.6°F | Resp 16 | Ht 74.0 in | Wt 240.6 lb

## 2018-05-02 DIAGNOSIS — I2510 Atherosclerotic heart disease of native coronary artery without angina pectoris: Secondary | ICD-10-CM | POA: Diagnosis not present

## 2018-05-02 DIAGNOSIS — S0300XA Dislocation of jaw, unspecified side, initial encounter: Secondary | ICD-10-CM | POA: Diagnosis not present

## 2018-05-02 MED ORDER — LIDOCAINE HCL 2 % EX GEL: 1.0000 "application " | Freq: Two times a day (BID) | CUTANEOUS | 0 refills | Status: DC | PRN

## 2018-05-02 NOTE — Patient Instructions (Signed)
Add tylenol 650mg  three times daily for next few weeks for jaw pain. You can also massage lidocaine gel twice daily overlying jaw joint. Call if symptoms worsen or if not improved in 1 month.

## 2018-05-02 NOTE — Progress Notes (Signed)
Subjective:    Patient ID: Jonathan [name]., male    DOB: 1936-07-30, 82 y.o.   MRN: [id number]  HPI  Mr. Clyne is an 82 yr old male with hx of DM2 and CAD  (hx of CAD and is s/p RCA stenting x 3- followed by Dr. Meda Coffee). who presents today with chief complaint of left sided jaw pain.  Pain has been present x 4-5 days. Reports + pain in the left side of his jaw.  Hurts to open his mouth wide.     Review of Systems See HPI  Past Medical History:  Diagnosis Date  . CAD (coronary artery disease)    Two RCA stents remotely / 3rd RCA stent 2006  . Colon polyps    s/p several Cscopes.  Marland Kitchen COPD (chronic obstructive pulmonary disease) (HCC)    on O2, nocturnal  . Diabetes mellitus    dx aprox 2009  . ED (erectile dysfunction)    has a vacumm device  . Ejection fraction   . Hyperlipidemia    dx in 90s  . Hypertension    dx in the 90  . Hypogonadism male   . PVD (peripheral vascular disease) ([place])    s/p stents at LE 2009, Dr Einar Gip  . Shortness of breath    O2 Sat dropped to 82% walking on the treadmill, September, 2012     Social History   Socioeconomic History  . Marital status: Married    Spouse name: Cerrella   . Number of children: 6  . Years of education: Not on file  . Highest education level: Not on file  Occupational History  . 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 Financial resource strain: Not on file  . Food insecurity:    Worry: Not on file    Inability: Not on file  . Transportation needs:    Medical: Not on file    Non-medical: Not on file  Tobacco Use  . Smoking status: Former Smoker    Packs/day: 2.00    Years: 30.00    Pack years: 60.00    Types: Cigarettes    Last attempt to quit: 06/17/1977    Years since quitting: 40.9  . Smokeless tobacco: Never Used  . Tobacco comment: 2 ppd, quit 1987  Substance and Sexual Activity  . Alcohol use: Yes    Alcohol/week: 0.0 standard drinks    Comment:  socially   . Drug use: No  . Sexual activity: Yes  Lifestyle  . Physical activity:    Days per week: Not on file    Minutes per session: Not on file  . Stress: Not on file  Relationships  . Social connections:    Talks on phone: Not on file    Gets together: Not on file    Attends religious service: Not on file    Active member of club or organization: Not on file    Attends meetings of clubs or organizations: Not on file    Relationship status: Not on file  . Intimate partner violence:    Fear of current or ex partner: Not on file    Emotionally abused: Not on file    Physically abused: Not on file    Forced sexual activity: Not on file  Other Topics Concern  . Not on file  Social History Narrative   4 children (lost 1 son)   Wife has 2 children  Past Surgical History:  Procedure Laterality Date  . APPENDECTOMY    . TONSILLECTOMY      Family History  Problem Relation Age of Onset  . Heart disease Father   . Hypertension Father   . Stroke Father   . Diabetes Paternal Aunt   . Diabetes Maternal Grandmother   . Diabetes Other        GM, nephews, many family members  . Hyperlipidemia Other        ?  . Prostate cancer Brother   . Colon cancer Neg Hx     No Known Allergies  Current Outpatient Medications on File Prior to Visit  Medication Sig Dispense Refill  . amLODipine (NORVASC) 10 MG tablet TAKE 1 TABLET DAILY 90 tablet 3  . aspirin 81 MG tablet Take 81 mg by mouth daily.      . Blood Glucose Monitoring Suppl (FREESTYLE LITE) DEVI 1 each by Does not apply route daily. Use as instructed to check blood sugar twice daily.  DX: E11.9 1 each each  . empagliflozin (JARDIANCE) 10 MG TABS tablet Take 10 mg by mouth daily. 90 tablet 3  . glimepiride (AMARYL) 4 MG tablet Take 4 mg by mouth daily with breakfast. Take 1/2 tablet daily    . glucose blood (FREESTYLE LITE) test strip Use as instructed to check blood sugar twice daily. 100 each 12  .  Glycopyrrolate-Formoterol (BEVESPI AEROSPHERE) 9-4.8 MCG/ACT AERO Inhale 2 puffs into the lungs 2 (two) times daily. 1 Inhaler 12  . Lancets (FREESTYLE) lancets USE AS INSTRUCTED TO CHECK BLOOD SUGAR TWICE A DAY 200 each 1  . losartan (COZAAR) 100 MG tablet Take 1 tablet (100 mg total) by mouth daily. 90 tablet 1  . metFORMIN (GLUCOPHAGE) 1000 MG tablet Take 1,000 mg by mouth daily.    . metoprolol succinate (TOPROL XL) 100 MG 24 hr tablet Take 1 tablet (100 mg total) by mouth daily. 90 tablet 0  . Multiple Vitamin (MULTIVITAMIN WITH MINERALS) TABS tablet Take 1 tablet by mouth daily.    . simvastatin (ZOCOR) 20 MG tablet Take 0.5 tablets (10 mg total) by mouth at bedtime.    . Testosterone 20.25 MG/ACT (1.62%) GEL Place 4 application onto the skin daily. (Patient taking differently: Place 3 application onto the skin daily. ) 225 g 3  . triamterene-hydrochlorothiazide (MAXZIDE-25) 37.5-25 MG tablet Take 0.5 tablets by mouth daily.    . TRULICITY 1.5 YI/9.4WN SOPN INJECT 1.5 MG INTO THE SKIN WEEKLY 6 mL 1   No current facility-administered medications on file prior to visit.     BP 117/87 (BP Location: Left Arm, Cuff Size: Large)   Pulse 70   Temp 97.6 F (36.4 C) (Axillary)   Resp 16   Ht 6\' 2"  (1.88 m)   Wt 240 lb 9.6 oz (109.1 kg)   SpO2 98%   BMI 30.89 kg/m       Objective:   Physical Exam  Constitutional: He is oriented to person, place, and time. He appears well-developed and well-nourished. No distress.  HENT:  Head: Normocephalic and atraumatic.  Cardiovascular: Normal rate and regular rhythm.  No murmur heard. Pulmonary/Chest: Effort normal and breath sounds normal. No respiratory distress. He has no wheezes. He has no rales.  Musculoskeletal: He exhibits no edema.  Able to open/close jaw-no click or swelling.   Neurological: He is alert and oriented to person, place, and time.  Skin: Skin is warm and dry.  Psychiatric: He has a normal mood and  affect. His behavior is  normal. Thought content normal.          Assessment & Plan:  TMJ-discussed case with Dr. Charlett Blake. Since he has CAD, would like to avoid NSAIDS.  Advised the following:  Add tylenol 650mg  three times daily for next few weeks for jaw pain.    You can also massage lidocaine gel twice daily overlying jaw joint. Call if symptoms worsen or if not improved in 1 month.

## 2018-05-06 ENCOUNTER — Other Ambulatory Visit (INDEPENDENT_AMBULATORY_CARE_PROVIDER_SITE_OTHER): Payer: Medicare Other

## 2018-05-06 DIAGNOSIS — E119 Type 2 diabetes mellitus without complications: Secondary | ICD-10-CM | POA: Diagnosis not present

## 2018-05-06 DIAGNOSIS — E291 Testicular hypofunction: Secondary | ICD-10-CM

## 2018-05-06 LAB — HEMOGLOBIN A1C: HEMOGLOBIN A1C: 6.8 % — AB (ref 4.6–6.5)

## 2018-05-06 LAB — BASIC METABOLIC PANEL
BUN: 17 mg/dL (ref 6–23)
CHLORIDE: 105 meq/L (ref 96–112)
CO2: 23 meq/L (ref 19–32)
CREATININE: 1.43 mg/dL (ref 0.40–1.50)
Calcium: 9.6 mg/dL (ref 8.4–10.5)
GFR: 61.16 mL/min (ref >60.00)
GLUCOSE: 133 mg/dL — AB (ref 70–99)
Potassium: 3.8 mEq/L (ref 3.5–5.1)
Sodium: 138 mEq/L (ref 135–145)

## 2018-05-06 LAB — TESTOSTERONE: Testosterone: 327.98 ng/dL (ref 300.00–890.00)

## 2018-05-09 ENCOUNTER — Ambulatory Visit: Payer: Medicare Other | Admitting: Endocrinology

## 2018-05-11 NOTE — Progress Notes (Signed)
Patient ID: Jonathan Leather., male   DOB: [birth year], 82 y.o.   MRN: [id number]    Reason for Appointment:  Follow-up  History of Present Illness:          Diagnosis: Type 2 diabetes mellitus, date of diagnosis:  2009      Past history: He is not clear what his diabetes was diagnosed but according to hospital records it may have been in 2009. Most likely he was placed on metformin initially and at some point Amaryl added. In 2013 it was also given Januvia presumably to improve his control. However appears that his A1c has been consistently over 7% Janumet was started instead of metformin and Januvia separately in 2013  He was started on Trulicity in 1/15 instead of Victoza because excessive bruising on his abdomen  Later in 01/2015 he was switched to Bydureon because of insurance noncoverage of his Trulicity  Recent history:   Non-insulin hypoglycemic drugs are: Metformin 1000 twice a day, Amaryl 4 mg 0.5  in the morning, Trulicity 1.5 mg weekly, Jardiance 10 mg daily  His A1c is consistently controlled, now 6.8 although was 6.5 and 1/19  Current blood sugar patterns and problems:  He has checked his blood sugars mostly in the morning hours with occasional readings after eating but none after supper lately  Not clear why his A1c is higher even though his blood sugars are averaging only 121 at home  He has continued his exercise  Weight is down a couple of pounds  Fasting blood sugar was 133 in the lab  He was told to reduce his Amaryl to half tablet on the last visit but he still has a low normal 75 after lunch in the afternoon Does not feel hypoglycemic after exercise     Side effects from medications have been: None  Glucose monitoring:  done  Once a day       Glucometer:  FreeStyle Blood Glucose readings  by download   FASTING range 117-134 After breakfast 115, 169 and after lunch 75 No readings after dinner OVERALL average 121   Self-care:      Meals:  3 meals per day. eating egg/meat bread or Boost for breakfast, dinner 6-7 PM    Exercise:  this is being done 3-4/7 days with cardio for about 50 minutes at HOME at 6 pm  Dietician visit: Most recent: 4/16 .               Weight history:  Wt Readings from Last 3 Encounters:  05/12/18 243 lb 12.8 oz (110.6 kg)  05/02/18 240 lb 9.6 oz (109.1 kg)  04/08/18 239 lb 6.4 oz (108.6 kg)   Glycemic control:  Lab Results  Component Value Date   HGBA1C 6.8 (H) 05/06/2018   HGBA1C 6.7 (H) 01/01/2018   HGBA1C 6.5 10/07/2017   Lab Results  Component Value Date   MICROALBUR 4.8 (H) 01/01/2018   LDLCALC 47 02/13/2017   CREATININE 1.43 05/06/2018    Lab Results  Component Value Date   FRUCTOSAMINE 284 03/29/2017   FRUCTOSAMINE 268 12/06/2016   FRUCTOSAMINE 282 02/22/2014    OTHER active problems: See review of systems  Lab on 05/06/2018  Component Date Value Ref Range Status  . Testosterone 05/06/2018 327.98  300.00 - 890.00 ng/dL Final  . Sodium 05/06/2018 138  135 - 145 mEq/L Final  . Potassium 05/06/2018 3.8  3.5 - 5.1 mEq/L Final  . Chloride 05/06/2018 105  96 - 112  mEq/L Final  . CO2 05/06/2018 23  19 - 32 mEq/L Final  . Glucose, Bld 05/06/2018 133* 70 - 99 mg/dL Final  . BUN 05/06/2018 17  6 - 23 mg/dL Final  . Creatinine, Ser 05/06/2018 1.43  0.40 - 1.50 mg/dL Final  . Calcium 05/06/2018 9.6  8.4 - 10.5 mg/dL Final  . GFR 05/06/2018 61.16  >60.00 mL/min Final  . Hgb A1c MFr Bld 05/06/2018 6.8* 4.6 - 6.5 % Final   Glycemic Control Guidelines for People with Diabetes:Non Diabetic:  <6%Goal of Therapy: <7%Additional Action Suggested:  >8%      Allergies as of 05/12/2018   No Known Allergies     Medication List        Accurate as of 05/12/18  9:16 AM. Always use your most recent med list.          amLODipine 10 MG tablet Commonly known as:  NORVASC TAKE 1 TABLET DAILY   aspirin 81 MG tablet Take 81 mg by mouth daily.   empagliflozin 10 MG Tabs tablet Commonly  known as:  JARDIANCE Take 10 mg by mouth daily.   freestyle lancets USE AS INSTRUCTED TO CHECK BLOOD SUGAR TWICE A DAY   FREESTYLE LITE Devi 1 each by Does not apply route daily. Use as instructed to check blood sugar twice daily.  DX: E11.9   glimepiride 4 MG tablet Commonly known as:  AMARYL Take 4 mg by mouth daily with breakfast. Take 1/2 tablet daily   glucose blood test strip Use as instructed to check blood sugar twice daily.   Glycopyrrolate-Formoterol 9-4.8 MCG/ACT Aero Inhale 2 puffs into the lungs 2 (two) times daily.   lidocaine 2 % jelly Commonly known as:  XYLOCAINE Apply 1 application topically 2 (two) times daily as needed. Massage twice daily to affected area   losartan 100 MG tablet Commonly known as:  COZAAR Take 1 tablet (100 mg total) by mouth daily.   metFORMIN 1000 MG tablet Commonly known as:  GLUCOPHAGE Take 1,000 mg by mouth daily.   metoprolol succinate 100 MG 24 hr tablet Commonly known as:  TOPROL-XL Take 1 tablet (100 mg total) by mouth daily.   multivitamin with minerals Tabs tablet Take 1 tablet by mouth daily.   simvastatin 20 MG tablet Commonly known as:  ZOCOR Take 0.5 tablets (10 mg total) by mouth at bedtime.   Testosterone 20.25 MG/ACT (1.62%) Gel Place 4 application onto the skin daily.   triamterene-hydrochlorothiazide 37.5-25 MG tablet Commonly known as:  MAXZIDE-25 Take 0.5 tablets by mouth daily.   TRULICITY 1.5 QJ/1.9ER Sopn Generic drug:  Dulaglutide INJECT 1.5 MG INTO THE SKIN WEEKLY       Allergies: No Known Allergies  Past Medical History:  Diagnosis Date  . CAD (coronary artery disease)    Two RCA stents remotely / 3rd RCA stent 2006  . Colon polyps    s/p several Cscopes.  Marland Kitchen COPD (chronic obstructive pulmonary disease) (HCC)    on O2, nocturnal  . Diabetes mellitus    dx aprox 2009  . ED (erectile dysfunction)    has a vacumm device  . Ejection fraction   . Hyperlipidemia    dx in 90s  .  Hypertension    dx in the 90  . Hypogonadism male   . PVD (peripheral vascular disease) ([place])    s/p stents at [place] 2009, Dr Einar Gip  . Shortness of breath    O2 Sat dropped to 82% walking on the treadmill,  September, 2012    Past Surgical History:  Procedure Laterality Date  . APPENDECTOMY    . TONSILLECTOMY      Family History  Problem Relation Age of Onset  . Heart disease Father   . Hypertension Father   . Stroke Father   . Diabetes Paternal Aunt   . Diabetes Maternal Grandmother   . Diabetes Other        GM, nephews, many family members  . Hyperlipidemia Other        ?  . Prostate cancer Brother   . Colon cancer Neg Hx     Social History:  reports that he quit smoking about 40 years ago. His smoking use included cigarettes. He has a 60.00 pack-year smoking history. He has never used smokeless tobacco. He reports that he drinks alcohol. He reports that he does not use drugs.    Review of Systems        Lipids: He has been on treatment with simvastatin 20 mg with good control       Lab Results  Component Value Date   CHOL 96 01/01/2018   HDL 31.70 (L) 01/01/2018   LDLCALC 47 02/13/2017   LDLDIRECT 55.0 01/01/2018   TRIG 209.0 (H) 01/01/2018   CHOLHDL 3 01/01/2018                  Has had erectile dysfunction for quite sometime.  Also had decreased libido previously and was found to have hypogonadism, probably in 2011.  He did have a slightly low free testosterone level in 2012 on treatment Prolactin level normal  Currently he has fairly normal energy  On his last visit his testosterone level was fairly good in the 300 range but because of his hemoglobin of 18.8 he was told to reduce his dose to 2 pumps a day However he is not using AndroGel 2 pumps on each arm, 3 days a week on his own His testosterone level was normal the day after he applied the AndroGel Needs follow-up CBC   Lab Results  Component Value Date   TESTOSTERONE 327.98 05/06/2018   Lab  Results  Component Value Date   PSA 0.60 02/14/2018   PSA 0.56 04/02/2016   PSA 0.60 01/20/2014   Lab Results  Component Value Date   HCT 56.2 (H) 02/14/2018         HYPERTENSION: This is followed by PCP and is on 100mg  of losartan along with half Maxide, amlodipine and metoprolol   Maxzide reduced to half a tablet with Jardiance  Blood pressure checked at home, usually normal   BP Readings from Last 3 Encounters:  05/12/18 124/78  05/02/18 117/87  04/08/18 122/64     LABS:  Lab on 05/06/2018  Component Date Value Ref Range Status  . Testosterone 05/06/2018 327.98  300.00 - 890.00 ng/dL Final  . Sodium 05/06/2018 138  135 - 145 mEq/L Final  . Potassium 05/06/2018 3.8  3.5 - 5.1 mEq/L Final  . Chloride 05/06/2018 105  96 - 112 mEq/L Final  . CO2 05/06/2018 23  19 - 32 mEq/L Final  . Glucose, Bld 05/06/2018 133* 70 - 99 mg/dL Final  . BUN 05/06/2018 17  6 - 23 mg/dL Final  . Creatinine, Ser 05/06/2018 1.43  0.40 - 1.50 mg/dL Final  . Calcium 05/06/2018 9.6  8.4 - 10.5 mg/dL Final  . GFR 05/06/2018 61.16  >60.00 mL/min Final  . Hgb A1c MFr Bld 05/06/2018 6.8* 4.6 - 6.5 % Final  Glycemic Control Guidelines for People with Diabetes:Non Diabetic:  <6%Goal of Therapy: <7%Additional Action Suggested:  >8%     Physical Examination:  BP 124/78 (BP Location: Left Arm, Patient Position: Sitting, Cuff Size: Normal)   Pulse 82   Ht 6\' 2"  (1.88 m)   Wt 243 lb 12.8 oz (110.6 kg)   SpO2 92%   BMI 31.30 kg/m       ASSESSMENT/PLAN:   Diabetes type 2, with obesity See history of present illness for discussion of current diabetes management, blood sugar patterns and problems identified  His blood sugars are reviewed and good with A1c 6.8 considering his age He does well with keeping his weight down, exercising and usually watching her diet However not checking enough readings after meals and these may be higher at times  HYPOGONADISM: Although his testosterone level is in  the 300 range and he is subjectively doing well his last hemoglobin was unexpectedly high and needs to be rechecked  Follow-up in 3 months  Patient Instructions  Check blood sugars on waking up  2-3/7  Also check blood sugars about 2 hours after a meal and do this after different meals by rotation  Recommended blood sugar levels on waking up is 90-130 and about 2 hours after meal is 130-160  Please bring your blood sugar monitor to each visit, thank you  3 pumps every day on gel     Moncia Annas 05/12/2018, 9:16 AM   Note: This office note was prepared with Dragon voice recognition system technology. Any transcriptional errors that result from this process are unintentional.

## 2018-05-12 ENCOUNTER — Ambulatory Visit (INDEPENDENT_AMBULATORY_CARE_PROVIDER_SITE_OTHER): Payer: Medicare Other | Admitting: Endocrinology

## 2018-05-12 ENCOUNTER — Encounter: Payer: Self-pay | Admitting: Endocrinology

## 2018-05-12 ENCOUNTER — Ambulatory Visit: Payer: Medicare Other | Admitting: Endocrinology

## 2018-05-12 VITALS — BP 124/78 | HR 82 | Ht 74.0 in | Wt 243.8 lb

## 2018-05-12 DIAGNOSIS — I10 Essential (primary) hypertension: Secondary | ICD-10-CM

## 2018-05-12 DIAGNOSIS — I2510 Atherosclerotic heart disease of native coronary artery without angina pectoris: Secondary | ICD-10-CM | POA: Diagnosis not present

## 2018-05-12 DIAGNOSIS — E291 Testicular hypofunction: Secondary | ICD-10-CM | POA: Diagnosis not present

## 2018-05-12 DIAGNOSIS — E119 Type 2 diabetes mellitus without complications: Secondary | ICD-10-CM | POA: Diagnosis not present

## 2018-05-12 DIAGNOSIS — D751 Secondary polycythemia: Secondary | ICD-10-CM | POA: Diagnosis not present

## 2018-05-12 LAB — CBC WITH DIFFERENTIAL/PLATELET
BASOS ABS: 0.1 10*3/uL (ref 0.0–0.1)
Basophils Relative: 0.9 % (ref 0.0–3.0)
EOS ABS: 0.3 10*3/uL (ref 0.0–0.7)
Eosinophils Relative: 5.2 % — ABNORMAL HIGH (ref 0.0–5.0)
HCT: 56.2 % — ABNORMAL HIGH (ref 39.0–52.0)
Hemoglobin: 19.1 g/dL (ref 13.0–17.0)
LYMPHS ABS: 2 10*3/uL (ref 0.7–4.0)
Lymphocytes Relative: 31.9 % (ref 12.0–46.0)
MCHC: 34 g/dL (ref 30.0–36.0)
MCV: 98.8 fl (ref 78.0–100.0)
MONO ABS: 0.6 10*3/uL (ref 0.1–1.0)
Monocytes Relative: 9.1 % (ref 3.0–12.0)
NEUTROS PCT: 52.9 % (ref 43.0–77.0)
Neutro Abs: 3.3 10*3/uL (ref 1.4–7.7)
Platelets: 197 10*3/uL (ref 150.0–400.0)
RBC: 5.69 Mil/uL (ref 4.22–5.81)
RDW: 15 % (ref 11.5–15.5)
WBC: 6.3 10*3/uL (ref 4.0–10.5)

## 2018-05-12 NOTE — Patient Instructions (Addendum)
Check blood sugars on waking up  2-3/7  Also check blood sugars about 2 hours after a meal and do this after different meals by rotation  Recommended blood sugar levels on waking up is 90-130 and about 2 hours after meal is 130-160  Please bring your blood sugar monitor to each visit, thank you  3 pumps every day on gel

## 2018-06-04 NOTE — Progress Notes (Addendum)
Subjective:   Jonathan Stairs. is a 82 y.o. male who presents for Medicare Annual/Subsequent preventive examination.  Review of Systems: No ROS.  Medicare Wellness Visit. Additional risk factors are reflected in the social history.   Sleep patterns: Sleeps with 2l of oxygen.  Home Safety/Smoke Alarms: Feels safe in home. Smoke alarms in place. Lives with wife in 2 story home.  Eye- done at [facility] yearly.  Male:   CCS- last 2016     PSA-  Lab Results  Component Value Date   PSA 0.60 02/14/2018   PSA 0.56 04/02/2016   PSA 0.60 01/20/2014       Objective:    Vitals: BP 140/86 (BP Location: Left Arm, Patient Position: Sitting, Cuff Size: Normal)   Pulse 74   Ht 6\' 2"  (1.88 m)   Wt 243 lb 12.8 oz (110.6 kg)   SpO2 96%   BMI 31.30 kg/m   Body mass index is 31.3 kg/m.  Advanced Directives 06/06/2018 06/05/2017 02/26/2017 10/16/2016 12/30/2014  Does Patient Have a Medical Advance Directive? No Yes No Yes Yes  Type of Advance Directive - [place];Living will - [place];Living will -  Copy of [place] in Chart? - No - copy requested - - -  Would patient like information on creating a medical advance directive? Yes (MAU/Ambulatory/Procedural Areas - Information given) - - - -    Tobacco Social History   Tobacco Use  Smoking Status Former Smoker  . Packs/day: 2.00  . Years: 30.00  . Pack years: 60.00  . Types: Cigarettes  . Last attempt to quit: 06/17/1977  . Years since quitting: 40.9  Smokeless Tobacco Never Used  Tobacco Comment   2 ppd, quit 1987     Counseling given: Not Answered Comment: 2 ppd, quit 1987   Clinical Intake: Pain : No/denies pain    Past Medical History:  Diagnosis Date  . CAD (coronary artery disease)    Two RCA stents remotely / 3rd RCA stent 2006  . Colon polyps    s/p several Cscopes.  Marland Kitchen COPD (chronic obstructive pulmonary disease) (HCC)    on O2, nocturnal  . Diabetes mellitus     dx aprox 2009  . ED (erectile dysfunction)    has a vacumm device  . Ejection fraction   . Hyperlipidemia    dx in 90s  . Hypertension    dx in the 90  . Hypogonadism male   . PVD (peripheral vascular disease) ([place])    s/p stents at [place] 2009, Dr Einar Gip  . Shortness of breath    O2 Sat dropped to 82% walking on the treadmill, September, 2012   Past Surgical History:  Procedure Laterality Date  . APPENDECTOMY    . TONSILLECTOMY     Family History  Problem Relation Age of Onset  . Heart disease Father   . Hypertension Father   . Stroke Father   . Diabetes Paternal Aunt   . Diabetes Maternal Grandmother   . Diabetes Other        GM, nephews, many family members  . Hyperlipidemia Other        ?  . Prostate cancer Brother   . Colon cancer Neg Hx    Social History   Socioeconomic History  . Marital status: Married    Spouse name: Jonathan [name]   . Number of children: 6  . Years of education: Not on file  . Highest education level:  Not on file  Occupational History  . 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 Financial resource strain: Not on file  . Food insecurity:    Worry: Not on file    Inability: Not on file  . Transportation needs:    Medical: Not on file    Non-medical: Not on file  Tobacco Use  . Smoking status: Former Smoker    Packs/day: 2.00    Years: 30.00    Pack years: 60.00    Types: Cigarettes    Last attempt to quit: 06/17/1977    Years since quitting: 40.9  . Smokeless tobacco: Never Used  . Tobacco comment: 2 ppd, quit 1987  Substance and Sexual Activity  . Alcohol use: Yes    Alcohol/week: 0.0 standard drinks    Comment: socially   . Drug use: No  . Sexual activity: Yes  Lifestyle  . Physical activity:    Days per week: Not on file    Minutes per session: Not on file  . Stress: Not on file  Relationships  . Social connections:    Talks on phone: Not on file    Gets together: Not on file     Attends religious service: Not on file    Active member of club or organization: Not on file    Attends meetings of clubs or organizations: Not on file    Relationship status: Not on file  Other Topics Concern  . Not on file  Social History Narrative   4 children (lost 1 son)   Wife has 2 children                 Outpatient Encounter Medications as of 06/06/2018  Medication Sig  . amLODipine (NORVASC) 10 MG tablet TAKE 1 TABLET DAILY  . aspirin 81 MG tablet Take 81 mg by mouth daily.    . Blood Glucose Monitoring Suppl (FREESTYLE LITE) DEVI 1 each by Does not apply route daily. Use as instructed to check blood sugar twice daily.  DX: E11.9  . empagliflozin (JARDIANCE) 10 MG TABS tablet Take 10 mg by mouth daily.  Marland Kitchen glimepiride (AMARYL) 4 MG tablet Take 4 mg by mouth daily with breakfast. Take 1/2 tablet daily  . glucose blood (FREESTYLE LITE) test strip Use as instructed to check blood sugar twice daily.  . Glycopyrrolate-Formoterol (BEVESPI AEROSPHERE) 9-4.8 MCG/ACT AERO Inhale 2 puffs into the lungs 2 (two) times daily.  . Lancets (FREESTYLE) lancets USE AS INSTRUCTED TO CHECK BLOOD SUGAR TWICE A DAY  . lidocaine (XYLOCAINE) 2 % jelly Apply 1 application topically 2 (two) times daily as needed. Massage twice daily to affected area  . losartan (COZAAR) 100 MG tablet Take 1 tablet (100 mg total) by mouth daily.  . metFORMIN (GLUCOPHAGE) 1000 MG tablet Take 1,000 mg by mouth daily.  . metoprolol succinate (TOPROL XL) 100 MG 24 hr tablet Take 1 tablet (100 mg total) by mouth daily.  . Multiple Vitamin (MULTIVITAMIN WITH MINERALS) TABS tablet Take 1 tablet by mouth daily.  . simvastatin (ZOCOR) 20 MG tablet Take 0.5 tablets (10 mg total) by mouth at bedtime.  . triamterene-hydrochlorothiazide (MAXZIDE-25) 37.5-25 MG tablet Take 0.5 tablets by mouth daily.  . TRULICITY 1.5 DU/2.0UR SOPN INJECT 1.5 MG INTO THE SKIN WEEKLY  . Testosterone 20.25 MG/ACT (1.62%) GEL Place 4 application onto  the skin daily. (Patient not taking: Reported on 06/06/2018)   No facility-administered encounter medications on  file as of 06/06/2018.     Activities of Daily Living In your present state of health, do you have any difficulty performing the following activities: 06/06/2018  Hearing? N  Vision? N  Difficulty concentrating or making decisions? N  Walking or climbing stairs? N  Dressing or bathing? N  Doing errands, shopping? N  Preparing Food and eating ? N  Using the Toilet? N  In the past six months, have you accidently leaked urine? N  Do you have problems with loss of bowel control? N  Managing your Medications? N  Managing your Finances? N  Housekeeping or managing your Housekeeping? N  Some recent data might be hidden    Patient Care Team: Colon Branch, MD as PCP - General Dorothy Spark, MD as PCP - Cardiology (Cardiology) Elayne Snare, MD as Consulting Physician (Endocrinology) Deneise Lever, MD as Consulting Physician (Pulmonary Disease) Clent Jacks, MD as Consulting Physician (Ophthalmology) Camelia Phenes, DPM as Consulting Physician (Podiatry) Dorothy Spark, MD as Consulting Physician (Cardiology) Carolan Clines, MD as Consulting Physician (Urology)   Assessment:   This is a routine wellness examination for Jonathan [name]. Physical assessment deferred to PCP.  Exercise Activities and Dietary recommendations Current Exercise Habits: The patient does not participate in regular exercise at present Diet (meal preparation, eat out, water intake, caffeinated beverages, dairy products, fruits and vegetables): well balanced    Goals    . Increase physical activity       Fall Risk Fall Risk  06/06/2018 06/05/2017 10/08/2016 04/02/2016 12/30/2014  Falls in the past year? No No No No No    Depression Screen PHQ 2/9 Scores 06/06/2018 06/05/2017 10/08/2016 04/02/2016  PHQ - 2 Score 0 0 0 0    Cognitive Function Ad8 score reviewed for issues:  Issues making  decisions:no  Less interest in hobbies / activities:no   Repeats questions, stories (family complaining):no  Trouble using ordinary gadgets (microwave, computer, phone):no  Forgets the month or year: no  Mismanaging finances: no  Remembering appts:no  Daily problems with thinking and/or memory:no Ad8 score is=0        Immunization History  Administered Date(s) Administered  . Influenza Split 06/18/2012  . Influenza Whole 05/02/2010, 06/18/2011  . Influenza, High Dose Seasonal PF 06/19/2016, 06/05/2017  . Influenza,inj,Quad PF,6+ Mos 06/04/2013, 06/10/2014, 07/06/2015  . Pneumococcal Conjugate-13 01/18/2014  . Pneumococcal Polysaccharide-23 06/21/2010  . Td 09/17/2004  . Tdap 11/18/2014   Screening Tests Health Maintenance  Topic Date Due  . INFLUENZA VACCINE  04/17/2018  . OPHTHALMOLOGY EXAM  08/17/2018  . FOOT EXAM  10/10/2018  . HEMOGLOBIN A1C  11/06/2018  . COLONOSCOPY  12/05/2021  . TETANUS/TDAP  11/17/2024  . PNA vac Low Risk Adult  Completed       Plan:    Please schedule your next medicare wellness visit with me in 1 yr.  Continue to eat heart healthy diet (full of fruits, vegetables, whole grains, lean protein, water--limit salt, fat, and sugar intake) and increase physical activity as tolerated.  Bring a copy of your living will and/or healthcare power of attorney to your next office visit.  Continue doing brain stimulating activities (puzzles, reading, adult coloring books, staying active) to keep memory sharp.    I have personally reviewed and noted the following in the patient's chart:   . Medical and social history . Use of alcohol, tobacco or illicit drugs  . Current medications and supplements . Functional ability and status . Nutritional status . Physical  activity . Advanced directives . List of other physicians . Hospitalizations, surgeries, and ER visits in previous 12 months . Vitals . Screenings to include cognitive, depression,  and falls . Referrals and appointments  In addition, I have reviewed and discussed with patient certain preventive protocols, quality metrics, and best practice recommendations. A written personalized care plan for preventive services as well as general preventive health recommendations were provided to patient.     Naaman Plummer [place], [place]  06/06/2018  Kathlene November, MD

## 2018-06-06 ENCOUNTER — Encounter: Payer: Self-pay | Admitting: *Deleted

## 2018-06-06 ENCOUNTER — Ambulatory Visit (INDEPENDENT_AMBULATORY_CARE_PROVIDER_SITE_OTHER): Payer: Medicare Other | Admitting: *Deleted

## 2018-06-06 VITALS — BP 140/86 | HR 74 | Ht 74.0 in | Wt 243.8 lb

## 2018-06-06 DIAGNOSIS — Z0000 Encounter for general adult medical examination without abnormal findings: Secondary | ICD-10-CM

## 2018-06-06 DIAGNOSIS — Z23 Encounter for immunization: Secondary | ICD-10-CM

## 2018-06-06 NOTE — Patient Instructions (Signed)
Please schedule your next medicare wellness visit with me in 1 yr.  Continue to eat heart healthy diet (full of fruits, vegetables, whole grains, lean protein, water--limit salt, fat, and sugar intake) and increase physical activity as tolerated.  Bring a copy of your living will and/or healthcare power of attorney to your next office visit.  Continue doing brain stimulating activities (puzzles, reading, adult coloring books, staying active) to keep memory sharp.    Jonathan [name] , Thank you for taking time to come for your Medicare Wellness Visit. I appreciate your ongoing commitment to your health goals. Please review the following plan we discussed and let me know if I can assist you in the future.   These are the goals we discussed: Goals    . Increase physical activity       This is a list of the screening recommended for you and due dates:  Health Maintenance  Topic Date Due  . Flu Shot  04/17/2018  . Eye exam for diabetics  08/17/2018  . Complete foot exam   10/10/2018  . Hemoglobin A1C  11/06/2018  . Colon Cancer Screening  12/05/2021  . Tetanus Vaccine  11/17/2024  . Pneumonia vaccines  Completed    Health Maintenance, Male A healthy lifestyle and preventive care is important for your health and wellness. Ask your health care provider about what schedule of regular examinations is right for you. What should I know about weight and diet? Eat a Healthy Diet  Eat plenty of vegetables, fruits, whole grains, low-fat dairy products, and lean protein.  Do not eat a lot of foods high in solid fats, added sugars, or salt.  Maintain a Healthy Weight Regular exercise can help you achieve or maintain a healthy weight. You should:  Do at least 150 minutes of exercise each week. The exercise should increase your heart rate and make you sweat (moderate-intensity exercise).  Do strength-training exercises at least twice a week.  Watch Your Levels of Cholesterol and Blood  Lipids  Have your blood tested for lipids and cholesterol every 5 years starting at 80 years of age. If you are at high risk for heart disease, you should start having your blood tested when you are 80 years old. You may need to have your cholesterol levels checked more often if: ? Your lipid or cholesterol levels are high. ? You are older than 80 years of age. ? You are at high risk for heart disease.  What should I know about cancer screening? Many types of cancers can be detected early and may often be prevented. Lung Cancer  You should be screened every year for lung cancer if: ? You are a current smoker who has smoked for at least 30 years. ? You are a former smoker who has quit within the past 15 years.  Talk to your health care provider about your screening options, when you should start screening, and how often you should be screened.  Colorectal Cancer  Routine colorectal cancer screening usually begins at 80 years of age and should be repeated every 5-10 years until you are 80 years old. You may need to be screened more often if early forms of precancerous polyps or small growths are found. Your health care provider may recommend screening at an earlier age if you have risk factors for colon cancer.  Your health care provider may recommend using home test kits to check for hidden blood in the stool.  A small camera at the end  of a tube can be used to examine your colon (sigmoidoscopy or colonoscopy). This checks for the earliest forms of colorectal cancer.  Prostate and Testicular Cancer  Depending on your age and overall health, your health care provider may do certain tests to screen for prostate and testicular cancer.  Talk to your health care provider about any symptoms or concerns you have about testicular or prostate cancer.  Skin Cancer  Check your skin from head to toe regularly.  Tell your health care provider about any new moles or changes in moles, especially  if: ? There is a change in a mole's size, shape, or color. ? You have a mole that is larger than a pencil eraser.  Always use sunscreen. Apply sunscreen liberally and repeat throughout the day.  Protect yourself by wearing long sleeves, pants, a wide-brimmed hat, and sunglasses when outside.  What should I know about heart disease, diabetes, and high blood pressure?  If you are 38-63 years of age, have your blood pressure checked every 3-5 years. If you are 41 years of age or older, have your blood pressure checked every year. You should have your blood pressure measured twice-once when you are at a hospital or clinic, and once when you are not at a hospital or clinic. Record the average of the two measurements. To check your blood pressure when you are not at a hospital or clinic, you can use: ? An automated blood pressure machine at a pharmacy. ? A home blood pressure monitor.  Talk to your health care provider about your target blood pressure.  If you are between 45-69 years old, ask your health care provider if you should take aspirin to prevent heart disease.  Have regular diabetes screenings by checking your fasting blood sugar level. ? If you are at a normal weight and have a low risk for diabetes, have this test once every three years after the age of 94. ? If you are overweight and have a high risk for diabetes, consider being tested at a younger age or more often.  A one-time screening for abdominal aortic aneurysm (AAA) by ultrasound is recommended for men aged 77-75 years who are current or former smokers. What should I know about preventing infection? Hepatitis B If you have a higher risk for hepatitis B, you should be screened for this virus. Talk with your health care provider to find out if you are at risk for hepatitis B infection. Hepatitis C Blood testing is recommended for:  Everyone born from [birth year] through 1965.  Anyone with known risk factors for hepatitis  C.  Sexually Transmitted Diseases (STDs)  You should be screened each year for STDs including gonorrhea and chlamydia if: ? You are sexually active and are younger than 80 years of age. ? You are older than 80 years of age and your health care provider tells you that you are at risk for this type of infection. ? Your sexual activity has changed since you were last screened and you are at an increased risk for chlamydia or gonorrhea. Ask your health care provider if you are at risk.  Talk with your health care provider about whether you are at high risk of being infected with HIV. Your health care provider may recommend a prescription medicine to help prevent HIV infection.  What else can I do?  Schedule regular health, dental, and eye exams.  Stay current with your vaccines (immunizations).  Do not use any tobacco products, such as cigarettes,  chewing tobacco, and e-cigarettes. If you need help quitting, ask your health care provider.  Limit alcohol intake to no more than 2 drinks per day. One drink equals 12 ounces of beer, 5 ounces of wine, or 1 ounces of hard liquor.  Do not use street drugs.  Do not share needles.  Ask your health care provider for help if you need support or information about quitting drugs.  Tell your health care provider if you often feel depressed.  Tell your health care provider if you have ever been abused or do not feel safe at home. This information is not intended to replace advice given to you by your health care provider. Make sure you discuss any questions you have with your health care provider. Document Released: 03/01/2008 Document Revised: 05/02/2016 Document Reviewed: 06/07/2015 Elsevier Interactive Patient Education  [employer].

## 2018-06-12 ENCOUNTER — Other Ambulatory Visit (INDEPENDENT_AMBULATORY_CARE_PROVIDER_SITE_OTHER): Payer: Medicare Other

## 2018-06-12 ENCOUNTER — Encounter: Payer: Self-pay | Admitting: Endocrinology

## 2018-06-12 ENCOUNTER — Ambulatory Visit (INDEPENDENT_AMBULATORY_CARE_PROVIDER_SITE_OTHER): Payer: Medicare Other | Admitting: Endocrinology

## 2018-06-12 ENCOUNTER — Ambulatory Visit: Payer: Medicare Other | Admitting: Endocrinology

## 2018-06-12 VITALS — BP 132/87 | HR 72 | Ht 74.0 in | Wt 240.0 lb

## 2018-06-12 DIAGNOSIS — E291 Testicular hypofunction: Secondary | ICD-10-CM

## 2018-06-12 DIAGNOSIS — I2510 Atherosclerotic heart disease of native coronary artery without angina pectoris: Secondary | ICD-10-CM | POA: Diagnosis not present

## 2018-06-12 DIAGNOSIS — D751 Secondary polycythemia: Secondary | ICD-10-CM

## 2018-06-12 DIAGNOSIS — E119 Type 2 diabetes mellitus without complications: Secondary | ICD-10-CM

## 2018-06-12 DIAGNOSIS — E1165 Type 2 diabetes mellitus with hyperglycemia: Secondary | ICD-10-CM

## 2018-06-12 LAB — LIPID PANEL
CHOLESTEROL: 110 mg/dL (ref 0–200)
HDL: 35.2 mg/dL — ABNORMAL LOW (ref >39.00)
LDL CALC: 58 mg/dL (ref 0–99)
NONHDL: 75.25
Total CHOL/HDL Ratio: 3
Triglycerides: 85 mg/dL (ref 0.0–149.0)
VLDL: 17 mg/dL (ref 0.0–40.0)

## 2018-06-12 LAB — COMPREHENSIVE METABOLIC PANEL
ALK PHOS: 59 U/L (ref 39–117)
ALT: 23 U/L (ref 0–53)
AST: 17 U/L (ref 0–37)
Albumin: 3.9 g/dL (ref 3.5–5.2)
BILIRUBIN TOTAL: 0.7 mg/dL (ref 0.2–1.2)
BUN: 18 mg/dL (ref 6–23)
CO2: 24 mEq/L (ref 19–32)
CREATININE: 1.31 mg/dL (ref 0.40–1.50)
Calcium: 9.5 mg/dL (ref 8.4–10.5)
Chloride: 107 mEq/L (ref 96–112)
GFR: 67.66 mL/min (ref >60.00)
Glucose, Bld: 108 mg/dL — ABNORMAL HIGH (ref 70–99)
Potassium: 4 mEq/L (ref 3.5–5.1)
Sodium: 140 mEq/L (ref 135–145)
TOTAL PROTEIN: 7 g/dL (ref 6.0–8.3)

## 2018-06-12 LAB — CBC
HCT: 52.8 % — ABNORMAL HIGH (ref 39.0–52.0)
MCHC: 33.9 g/dL (ref 30.0–36.0)
MCV: 98.2 fl (ref 78.0–100.0)
PLATELETS: 189 10*3/uL (ref 150.0–400.0)
RBC: 5.38 Mil/uL (ref 4.22–5.81)
RDW: 14.5 % (ref 11.5–15.5)
WBC: 7 10*3/uL (ref 4.0–10.5)

## 2018-06-12 LAB — TESTOSTERONE: TESTOSTERONE: 198 ng/dL — AB (ref 300.00–890.00)

## 2018-06-12 LAB — HEMOGLOBIN A1C: HEMOGLOBIN A1C: 6.9 % — AB (ref 4.6–6.5)

## 2018-06-12 NOTE — Patient Instructions (Signed)
Stop Glimeperide and call if sugars >200

## 2018-06-12 NOTE — Progress Notes (Signed)
Patient ID: Jonathan [name]., male   DOB: 05/24/1936, 82 y.o.   MRN: [id number]    Reason for Appointment:  Follow-up  History of Present Illness:          Diagnosis: Type 2 diabetes mellitus, date of diagnosis:  2009      Past history: He is not clear what his diabetes was diagnosed but according to hospital records it may have been in 2009. Most likely he was placed on metformin initially and at some point Amaryl added. In 2013 it was also given Januvia presumably to improve his control. However appears that his A1c has been consistently over 7% Janumet was started instead of metformin and Januvia separately in 2013  He was started on Trulicity in 5/03 instead of Victoza because excessive bruising on his abdomen  Later in 01/2015 he was switched to Bydureon because of insurance noncoverage of his Trulicity  Recent history:   Non-insulin hypoglycemic drugs are: Metformin 1000 twice a day, Amaryl 4 mg half tablet in the morning, Trulicity 1.5 mg weekly, Jardiance 10 mg daily  His A1c is 6.9 stable  Current blood sugar patterns and problems:  He has had some variability in his blood sugars over the last 2 weeks  He says that he is generally more active because of taking care of his wife and household activities  Weight is slightly lower  His blood sugars have been in the 60s a couple of times since Sunday even though he thinks he has not been skipping his meals  Fasting blood sugars are somewhat variable and mildly increased  Has only a couple of readings after evening meal and these are variable      Side effects from medications have been: None  Glucose monitoring:  done  Once a day       Glucometer:  FreeStyle Blood Glucose readings  by download    PRE-MEAL Fasting Lunch Dinner Bedtime Overall  Glucose range:  98-161   66-101    Mean/median:      11 4   POST-MEAL PC Breakfast PC Lunch PC Dinner  Glucose range:    76-197  Mean/median:       Self-care:       Meals: 3 meals per day. eating egg/meat bread or Boost for breakfast, dinner 6-7 PM    Exercise:  this is being done 3-4/7 days with cardio for about 50 minutes at HOME at 6 pm  Dietician visit: Most recent: 4/16 .               Weight history:  Wt Readings from Last 3 Encounters:  06/12/18 240 lb (108.9 kg)  06/06/18 243 lb 12.8 oz (110.6 kg)  05/12/18 243 lb 12.8 oz (110.6 kg)   Glycemic control:  Lab Results  Component Value Date   HGBA1C 6.9 (H) 06/12/2018   HGBA1C 6.8 (H) 05/06/2018   HGBA1C 6.7 (H) 01/01/2018   Lab Results  Component Value Date   MICROALBUR 4.8 (H) 01/01/2018   LDLCALC 58 06/12/2018   CREATININE 1.31 06/12/2018    Lab Results  Component Value Date   FRUCTOSAMINE 284 03/29/2017   FRUCTOSAMINE 268 12/06/2016   FRUCTOSAMINE 282 02/22/2014    OTHER active problems addressed today: See review of systems  Lab on 06/12/2018  Component Date Value Ref Range Status  . Cholesterol 06/12/2018 110  0 - 200 mg/dL Final   ATP III Classification       Desirable:  < 200 mg/dL  Borderline High:  200 - 239 mg/dL          High:  > = 240 mg/dL  . Triglycerides 06/12/2018 85.0  0.0 - 149.0 mg/dL Final   Normal:  <150 mg/dLBorderline High:  150 - 199 mg/dL  . HDL 06/12/2018 35.20* >39.00 mg/dL Final  . VLDL 06/12/2018 17.0  0.0 - 40.0 mg/dL Final  . LDL Cholesterol 06/12/2018 58  0 - 99 mg/dL Final  . Total CHOL/HDL Ratio 06/12/2018 3   Final                  Men          Women1/2 Average Risk     3.4          3.3Average Risk          5.0          4.42X Average Risk          9.6          7.13X Average Risk          15.0          11.0                      . NonHDL 06/12/2018 75.25   Final   NOTE:  Non-HDL goal should be 30 mg/dL higher than patient's LDL goal (i.e. LDL goal of < 70 mg/dL, would have non-HDL goal of < 100 mg/dL)  . Testosterone 06/12/2018 198.00* 300.00 - 890.00 ng/dL Final  . WBC 06/12/2018 7.0  4.0 - 10.5 K/uL Final  . RBC  06/12/2018 5.38  4.22 - 5.81 Mil/uL Final  . Platelets 06/12/2018 189.0  150.0 - 400.0 K/uL Final  . Hemoglobin 06/12/2018 17.9 Repeated and verified X2.* 13.0 - 17.0 g/dL Final  . HCT 06/12/2018 52.8 Repeated and verified X2.* 39.0 - 52.0 % Final  . MCV 06/12/2018 98.2  78.0 - 100.0 fl Final  . MCHC 06/12/2018 33.9  30.0 - 36.0 g/dL Final  . RDW 06/12/2018 14.5  11.5 - 15.5 % Final  . Sodium 06/12/2018 140  135 - 145 mEq/L Final  . Potassium 06/12/2018 4.0  3.5 - 5.1 mEq/L Final  . Chloride 06/12/2018 107  96 - 112 mEq/L Final  . CO2 06/12/2018 24  19 - 32 mEq/L Final  . Glucose, Bld 06/12/2018 108* 70 - 99 mg/dL Final  . BUN 06/12/2018 18  6 - 23 mg/dL Final  . Creatinine, Ser 06/12/2018 1.31  0.40 - 1.50 mg/dL Final  . Total Bilirubin 06/12/2018 0.7  0.2 - 1.2 mg/dL Final  . Alkaline Phosphatase 06/12/2018 59  39 - 117 U/L Final  . AST 06/12/2018 17  0 - 37 U/L Final  . ALT 06/12/2018 23  0 - 53 U/L Final  . Total Protein 06/12/2018 7.0  6.0 - 8.3 g/dL Final  . Albumin 06/12/2018 3.9  3.5 - 5.2 g/dL Final  . Calcium 06/12/2018 9.5  8.4 - 10.5 mg/dL Final  . GFR 06/12/2018 67.66  >60.00 mL/min Final  . Hgb A1c MFr Bld 06/12/2018 6.9* 4.6 - 6.5 % Final   Glycemic Control Guidelines for People with Diabetes:Non Diabetic:  <6%Goal of Therapy: <7%Additional Action Suggested:  >8%      Allergies as of 06/12/2018   No Known Allergies     Medication List        Accurate as of 06/12/18  3:04 PM. Always use your most recent med list.  amLODipine 10 MG tablet Commonly known as:  NORVASC TAKE 1 TABLET DAILY   aspirin 81 MG tablet Take 81 mg by mouth daily.   empagliflozin 10 MG Tabs tablet Commonly known as:  JARDIANCE Take 10 mg by mouth daily.   freestyle lancets USE AS INSTRUCTED TO CHECK BLOOD SUGAR TWICE A DAY   FREESTYLE LITE Devi 1 each by Does not apply route daily. Use as instructed to check blood sugar twice daily.  DX: E11.9   glimepiride 4 MG  tablet Commonly known as:  AMARYL Take 4 mg by mouth daily with breakfast. Take 1/2 tablet daily   glucose blood test strip Use as instructed to check blood sugar twice daily.   Glycopyrrolate-Formoterol 9-4.8 MCG/ACT Aero Inhale 2 puffs into the lungs 2 (two) times daily.   lidocaine 2 % jelly Commonly known as:  XYLOCAINE Apply 1 application topically 2 (two) times daily as needed. Massage twice daily to affected area   losartan 100 MG tablet Commonly known as:  COZAAR Take 1 tablet (100 mg total) by mouth daily.   metFORMIN 1000 MG tablet Commonly known as:  GLUCOPHAGE Take 1,000 mg by mouth daily.   metoprolol succinate 100 MG 24 hr tablet Commonly known as:  TOPROL-XL Take 1 tablet (100 mg total) by mouth daily.   multivitamin with minerals Tabs tablet Take 1 tablet by mouth daily.   simvastatin 20 MG tablet Commonly known as:  ZOCOR Take 0.5 tablets (10 mg total) by mouth at bedtime.   Testosterone 20.25 MG/ACT (1.62%) Gel Place 4 application onto the skin daily.   triamterene-hydrochlorothiazide 37.5-25 MG tablet Commonly known as:  MAXZIDE-25 Take 0.5 tablets by mouth daily.   TRULICITY 1.5 GM/0.1UU Sopn Generic drug:  Dulaglutide INJECT 1.5 MG INTO THE SKIN WEEKLY       Allergies: No Known Allergies  Past Medical History:  Diagnosis Date  . CAD (coronary artery disease)    Two RCA stents remotely / 3rd RCA stent 2006  . Colon polyps    s/p several Cscopes.  Marland Kitchen COPD (chronic obstructive pulmonary disease) (HCC)    on O2, nocturnal  . Diabetes mellitus    dx aprox 2009  . ED (erectile dysfunction)    has a vacumm device  . Ejection fraction   . Hyperlipidemia    dx in 90s  . Hypertension    dx in the 90  . Hypogonadism male   . PVD (peripheral vascular disease) ([place])    s/p stents at [place] 2009, Dr Einar Gip  . Shortness of breath    O2 Sat dropped to 82% walking on the treadmill, September, 2012    Past Surgical History:  Procedure Laterality  Date  . APPENDECTOMY    . TONSILLECTOMY      Family History  Problem Relation Age of Onset  . Heart disease Father   . Hypertension Father   . Stroke Father   . Diabetes Paternal Aunt   . Diabetes Maternal Grandmother   . Diabetes Other        GM, nephews, many family members  . Hyperlipidemia Other        ?  . Prostate cancer Brother   . Colon cancer Neg Hx     Social History:  reports that he quit smoking about 41 years ago. His smoking use included cigarettes. He has a 60.00 pack-year smoking history. He has never used smokeless tobacco. He reports that he drinks alcohol. He reports that he does not use drugs.  Review of Systems        Lipids: He has been on treatment with simvastatin 20 mg with good control       Lab Results  Component Value Date   CHOL 110 06/12/2018   HDL 35.20 (L) 06/12/2018   LDLCALC 58 06/12/2018   LDLDIRECT 55.0 01/01/2018   TRIG 85.0 06/12/2018   CHOLHDL 3 06/12/2018                   HYPOGONADISM He had decreased libido and erectile dysfunction previously and was found to have hypogonadism, probably in 2011. He did have a slightly low free testosterone level in 2012 on treatment Prolactin level normal  He had been taking AndroGel only 3 days a week and usually does not follow instructions for daily treatment However even with his testosterone being in the 300+ range his hemoglobin has been high since 5/19  With his hemoglobin going up to 9.11 in August he has been told not to take any testosterone  He says he does not feel any fatigue or sluggishness even without the testosterone supplement and his level is only 198 now  His hemoglobin is improving and is down to 17.9; no complaints of headaches or dizziness   Lab Results  Component Value Date   TESTOSTERONE 198.00 (L) 06/12/2018   Lab Results  Component Value Date   PSA 0.60 02/14/2018   PSA 0.56 04/02/2016   PSA 0.60 01/20/2014   CBC Latest Ref Rng & Units 06/12/2018  05/12/2018 02/14/2018  WBC 4.0 - 10.5 K/uL 7.0 6.3 6.9  Hemoglobin 13.0 - 17.0 g/dL 17.9 Repeated and verified X2.(H) 19.1 Repeated and verified X2.(HH) 18.8 Repeated and verified X2.(HH)  Hematocrit 39.0 - 52.0 % 52.8 Repeated and verified X2.(H) 56.2 Repeated and verified X2.(H) 56.2(H)  Platelets 150.0 - 400.0 K/uL 189.0 197.0 174.0          HYPERTENSION: This is followed by PCP and is on 100mg  of losartan along with half Maxide, amlodipine and metoprolol    BP Readings from Last 3 Encounters:  06/12/18 132/87  06/06/18 140/86  05/12/18 124/78     LABS:  Lab on 06/12/2018  Component Date Value Ref Range Status  . Cholesterol 06/12/2018 110  0 - 200 mg/dL Final   ATP III Classification       Desirable:  < 200 mg/dL               Borderline High:  200 - 239 mg/dL          High:  > = 240 mg/dL  . Triglycerides 06/12/2018 85.0  0.0 - 149.0 mg/dL Final   Normal:  <150 mg/dLBorderline High:  150 - 199 mg/dL  . HDL 06/12/2018 35.20* >39.00 mg/dL Final  . VLDL 06/12/2018 17.0  0.0 - 40.0 mg/dL Final  . LDL Cholesterol 06/12/2018 58  0 - 99 mg/dL Final  . Total CHOL/HDL Ratio 06/12/2018 3   Final                  Men          Women1/2 Average Risk     3.4          3.3Average Risk          5.0          4.42X Average Risk          9.6          7.13X Average Risk  15.0          11.0                      . NonHDL 06/12/2018 75.25   Final   NOTE:  Non-HDL goal should be 30 mg/dL higher than patient's LDL goal (i.e. LDL goal of < 70 mg/dL, would have non-HDL goal of < 100 mg/dL)  . Testosterone 06/12/2018 198.00* 300.00 - 890.00 ng/dL Final  . WBC 06/12/2018 7.0  4.0 - 10.5 K/uL Final  . RBC 06/12/2018 5.38  4.22 - 5.81 Mil/uL Final  . Platelets 06/12/2018 189.0  150.0 - 400.0 K/uL Final  . Hemoglobin 06/12/2018 17.9 Repeated and verified X2.* 13.0 - 17.0 g/dL Final  . HCT 06/12/2018 52.8 Repeated and verified X2.* 39.0 - 52.0 % Final  . MCV 06/12/2018 98.2  78.0 - 100.0 fl Final   . MCHC 06/12/2018 33.9  30.0 - 36.0 g/dL Final  . RDW 06/12/2018 14.5  11.5 - 15.5 % Final  . Sodium 06/12/2018 140  135 - 145 mEq/L Final  . Potassium 06/12/2018 4.0  3.5 - 5.1 mEq/L Final  . Chloride 06/12/2018 107  96 - 112 mEq/L Final  . CO2 06/12/2018 24  19 - 32 mEq/L Final  . Glucose, Bld 06/12/2018 108* 70 - 99 mg/dL Final  . BUN 06/12/2018 18  6 - 23 mg/dL Final  . Creatinine, Ser 06/12/2018 1.31  0.40 - 1.50 mg/dL Final  . Total Bilirubin 06/12/2018 0.7  0.2 - 1.2 mg/dL Final  . Alkaline Phosphatase 06/12/2018 59  39 - 117 U/L Final  . AST 06/12/2018 17  0 - 37 U/L Final  . ALT 06/12/2018 23  0 - 53 U/L Final  . Total Protein 06/12/2018 7.0  6.0 - 8.3 g/dL Final  . Albumin 06/12/2018 3.9  3.5 - 5.2 g/dL Final  . Calcium 06/12/2018 9.5  8.4 - 10.5 mg/dL Final  . GFR 06/12/2018 67.66  >60.00 mL/min Final  . Hgb A1c MFr Bld 06/12/2018 6.9* 4.6 - 6.5 % Final   Glycemic Control Guidelines for People with Diabetes:Non Diabetic:  <6%Goal of Therapy: <7%Additional Action Suggested:  >8%     Physical Examination:  BP 132/87   Pulse 72   Ht 6\' 2"  (1.88 m)   Wt 240 lb (108.9 kg)   SpO2 98%   BMI 30.81 kg/m       ASSESSMENT/PLAN:   ERYTHROCYTOSIS  His hemoglobin is improving with reducing and recently stopping testosterone supplements However his hemoglobin is still significantly high at 17.9 but may continue to improve Currently asymptomatic Since his hemoglobin has gone up significantly over the last few months not clear if he has an underlying hematological issue also  He will be seen in follow-up in about 2 months  Hypogonadism: Despite his testosterone level getting lower than normal he is currently asymptomatic and will continue to observe without treatment   Diabetes type 2, with obesity  His blood sugars have been relatively low in the afternoons including a couple of readings in the 60s This is likely to be from his being more active He will stop his Amaryl  and let us know if his blood sugars start going up excessively  LIPIDS: LDL below 70  There are no Patient Instructions on file for this visit.    Elayne Snare 06/12/2018, 3:04 PM   Note: This office note was prepared with Dragon voice recognition system technology. Any transcriptional errors that result from this  process are unintentional.

## 2018-06-24 ENCOUNTER — Telehealth: Payer: Self-pay | Admitting: Internal Medicine

## 2018-06-24 NOTE — Telephone Encounter (Signed)
Called and spoke to patient, patient states his POC stopped working, [facility] came and picked it up and never returned it. Phoned [employer], per Jeani Hawking they have to ship the POC back to the manufacturer and it can take anywhere from 2 weeks to a month and a half. Returned call to patient to make aware. Patient voices that he will be going out out town at the end of the month and he needs his POC to travel. It was confirmed with patient that he does have a concentrator at home and he does not wear his oxygen 24-7. Informed patient that if he did not hear from [place] by next week to give [place] a call and we would re-address it then. Patient voiced understanding.

## 2018-07-26 ENCOUNTER — Other Ambulatory Visit: Payer: Self-pay | Admitting: Internal Medicine

## 2018-07-29 ENCOUNTER — Ambulatory Visit (HOSPITAL_BASED_OUTPATIENT_CLINIC_OR_DEPARTMENT_OTHER)
Admission: RE | Admit: 2018-07-29 | Discharge: 2018-07-29 | Disposition: A | Discharge status: Home / Self Care | Payer: Medicare Other | Source: Ambulatory Visit | Attending: Internal Medicine | Admitting: Internal Medicine

## 2018-07-29 ENCOUNTER — Ambulatory Visit (INDEPENDENT_AMBULATORY_CARE_PROVIDER_SITE_OTHER): Payer: Medicare Other | Admitting: Internal Medicine

## 2018-07-29 ENCOUNTER — Encounter: Payer: Self-pay | Admitting: Internal Medicine

## 2018-07-29 VITALS — BP 126/70 | HR 79 | Temp 97.9°F | Resp 16 | Ht 74.0 in | Wt 238.5 lb

## 2018-07-29 DIAGNOSIS — D72821 Monocytosis (symptomatic): Secondary | ICD-10-CM

## 2018-07-29 DIAGNOSIS — I2510 Atherosclerotic heart disease of native coronary artery without angina pectoris: Secondary | ICD-10-CM | POA: Diagnosis not present

## 2018-07-29 DIAGNOSIS — G629 Polyneuropathy, unspecified: Secondary | ICD-10-CM

## 2018-07-29 DIAGNOSIS — D582 Other hemoglobinopathies: Secondary | ICD-10-CM | POA: Diagnosis not present

## 2018-07-29 DIAGNOSIS — E1159 Type 2 diabetes mellitus with other circulatory complications: Secondary | ICD-10-CM

## 2018-07-29 DIAGNOSIS — R634 Abnormal weight loss: Secondary | ICD-10-CM

## 2018-07-29 NOTE — Patient Instructions (Addendum)
GO TO THE LAB : Get the blood work     GO TO THE FRONT DESK Schedule your next appointment for a  Check up in 3 months    STOP BY THE FIRST FLOOR:  get the XR     CARING FOR THE MEMORY IMPAIRED   The 36-Hour Day a book by Eda Keys and Collier Salina Rabins is a detailed self-help guide for people caring for loved ones with Alzheimer's disease, dementia, and other memory impairments.  Web Resources : [employer]  http://rojas.com/  Local Resource: [facility]

## 2018-07-29 NOTE — Progress Notes (Signed)
Subjective:    Patient ID: Jonathan Leather., male    DOB: 09/15/1936, 82 y.o.   MRN: [id number]  DOS:  07/29/2018 Type of visit - description : acute Interval history: His CC is wt loss.  He feels his arms and legs are less "bulky".  He takes care of his wife who has dementia, reports that he gets very tired on the end of the day,  states that taking care of her is "taking a toll" on him. Nevertheless, he does not feel anxious or depressed, just busy and sometimes tired.  Wt Readings from Last 3 Encounters:  07/29/18 238 lb 8 oz (108.2 kg)  06/12/18 240 lb (108.9 kg)  06/06/18 243 lb 12.8 oz (110.6 kg)     Review of Systems  Denies fever or chills. No nausea, vomiting, diarrhea.  No blood in the stools or in the urine No chest pain Shortness of breath at baseline No night sweats No headaches or unusual aches and pains.  Past Medical History:  Diagnosis Date  . CAD (coronary artery disease)    Two RCA stents remotely / 3rd RCA stent 2006  . Colon polyps    s/p several Cscopes.  Marland Kitchen COPD (chronic obstructive pulmonary disease) (HCC)    on O2, nocturnal  . Diabetes mellitus    dx aprox 2009  . ED (erectile dysfunction)    has a vacumm device  . Ejection fraction   . Hyperlipidemia    dx in 90s  . Hypertension    dx in the 90  . Hypogonadism male   . PVD (peripheral vascular disease) ([place])    s/p stents at [facility] 2009, Dr Einar Gip  . Shortness of breath    O2 Sat dropped to 82% walking on the treadmill, September, 2012    Past Surgical History:  Procedure Laterality Date  . APPENDECTOMY    . TONSILLECTOMY      Social History   Socioeconomic History  . Marital status: Married    Spouse name: Cerrella   . Number of children: 6  . Years of education: Not on file  . Highest education level: Not on file  Occupational History  . 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 Financial resource strain: Not on file  . Food  insecurity:    Worry: Not on file    Inability: Not on file  . Transportation needs:    Medical: Not on file    Non-medical: Not on file  Tobacco Use  . Smoking status: Former Smoker    Packs/day: 2.00    Years: 30.00    Pack years: 60.00    Types: Cigarettes    Last attempt to quit: 06/17/1977    Years since quitting: 41.1  . Smokeless tobacco: Never Used  . Tobacco comment: 2 ppd, quit 1987  Substance and Sexual Activity  . Alcohol use: Yes    Alcohol/week: 0.0 standard drinks    Comment: socially   . Drug use: No  . Sexual activity: Yes  Lifestyle  . Physical activity:    Days per week: Not on file    Minutes per session: Not on file  . Stress: Not on file  Relationships  . Social connections:    Talks on phone: Not on file    Gets together: Not on file    Attends religious service: Not on file    Active member of club or organization: Not  on file    Attends meetings of clubs or organizations: Not on file    Relationship status: Not on file  . Intimate partner violence:    Fear of current or ex partner: Not on file    Emotionally abused: Not on file    Physically abused: Not on file    Forced sexual activity: Not on file  Other Topics Concern  . Not on file  Social History Narrative   4 children (lost 1 son)   Wife has 2 children                   Allergies as of 07/29/2018   No Known Allergies     Medication List        Accurate as of 07/29/18 11:59 PM. Always use your most recent med list.          amLODipine 10 MG tablet Commonly known as:  NORVASC TAKE 1 TABLET DAILY   aspirin 81 MG tablet Take 81 mg by mouth daily.   empagliflozin 10 MG Tabs tablet Commonly known as:  JARDIANCE Take 10 mg by mouth daily.   freestyle lancets USE AS INSTRUCTED TO CHECK BLOOD SUGAR TWICE A DAY   FREESTYLE LITE Devi 1 each by Does not apply route daily. Use as instructed to check blood sugar twice daily.  DX: E11.9   glimepiride 4 MG  tablet Commonly known as:  AMARYL Take 4 mg by mouth daily with breakfast. Take 1/2 tablet daily   glucose blood test strip Use as instructed to check blood sugar twice daily.   Glycopyrrolate-Formoterol 9-4.8 MCG/ACT Aero Inhale 2 puffs into the lungs 2 (two) times daily.   lidocaine 2 % jelly Commonly known as:  XYLOCAINE Apply 1 application topically 2 (two) times daily as needed. Massage twice daily to affected area   losartan 100 MG tablet Commonly known as:  COZAAR Take 1 tablet (100 mg total) by mouth daily.   metFORMIN 1000 MG tablet Commonly known as:  GLUCOPHAGE Take 1,000 mg by mouth daily.   metoprolol succinate 100 MG 24 hr tablet Commonly known as:  TOPROL-XL Take 1 tablet (100 mg total) by mouth daily.   multivitamin with minerals Tabs tablet Take 1 tablet by mouth daily.   simvastatin 20 MG tablet Commonly known as:  ZOCOR Take 0.5 tablets (10 mg total) by mouth at bedtime.   triamterene-hydrochlorothiazide 37.5-25 MG tablet Commonly known as:  MAXZIDE-25 Take 0.5 tablets by mouth daily.   TRULICITY 1.5 PX/1.0GY Sopn Generic drug:  Dulaglutide INJECT 1.5 MG INTO THE SKIN WEEKLY          Objective:   Physical Exam  Neck:     BP 126/70 (BP Location: Right Arm, Patient Position: Sitting, Cuff Size: Small)   Pulse 79   Temp 97.9 F (36.6 C) (Oral)   Resp 16   Ht 6\' 2"  (1.88 m)   Wt 238 lb 8 oz (108.2 kg)   SpO2 94%   BMI 30.62 kg/m  General:   Well developed, NAD, BMI noted.  HEENT:  Normocephalic . Face symmetric, atraumatic Neck: No thyromegaly Lymphatic system: Normal exam of the inguinal arm armpit areas.  Question of a 1.5 cm, soft, mobile, nontender lymph node at the left supraclavicular area.  See graphic. Lungs:  CTA B Normal respiratory effort, no intercostal retractions, no accessory muscle use. Heart: RRR,  no murmur.  no pretibial edema bilaterally  Abdomen:  Not distended, soft, non-tender. No rebound or  rigidity.    Skin: Not pale. Not jaundice Neurologic:  alert & oriented X3.  Speech normal, gait appropriate for age and unassisted Psych--  Cognition and judgment appear intact.  Cooperative with normal attention span and concentration.  Behavior appropriate. No anxious or depressed appearing.     Assessment & Plan:   Assessment DM dx ~7741, complicated by CAD, PVD, mild neuropathy. Sees Dr. Dwyane Dee Hypogonadism. Sees Dr. Dwyane Dee HTN Hyperlipidemia COPD, nocturnal O2 prn Back pain, chronic: See visit 10/12/2016 CV: -CAD, Dr. Ron Parker Dr Meda Coffee S/p  stenting 2 to the RCA remote past and the third RCA stent in  2006, negative stress test in 2012, LVEF 55% on echo 2015 -Peripheral vascular disease, stents lower extremity 2009  ED  PLAN    Weight loss: Weight 07-2017 248, today 238, lost 10 pounds in a year. Probably multifactorial including the fact that he started Jardiance 02/2017 and also he stopped testosterone about a month ago. ROS and physical exam is benign except that he is very busy taking care of his wife with dementia and a question of a slightly enlarged lymph node at the left supraclavicular area.  Labs reviewed. Plan: Chest x-ray, TSH, peripheral blood smear. Neuropathy: Reports "cold feet", uncomfortable sensation of the feet; sxs  disturbing to him, good pedal pulses, no vascular insufficiency on exam.  Symptoms are probably related to neuropathy.  Check O87, folic acid, B1.  Offered gabapentin. Caregiver fatigue?  That may be playing a role, information about support provided, see AVS RTC 3 months

## 2018-07-29 NOTE — Progress Notes (Signed)
Pre visit review using our clinic review tool, if applicable. No additional management support is needed unless otherwise documented below in the visit note. 

## 2018-07-30 LAB — B12 AND FOLATE PANEL: VITAMIN B 12: 436 pg/mL (ref 211–911)

## 2018-07-30 LAB — CBC (INCLUDES DIFF/PLT) WITH PATHOLOGIST REVIEW
BASOS ABS: 83 {cells}/uL (ref 0–200)
Basophils Relative: 0.9 %
EOS ABS: 368 {cells}/uL (ref 15–500)
EOS PCT: 4 %
HCT: 53.4 % — ABNORMAL HIGH (ref 38.5–50.0)
Hemoglobin: 18.6 g/dL — ABNORMAL HIGH (ref 13.2–17.1)
Lymphs Abs: 3367 cells/uL (ref 850–3900)
MCH: 33.9 pg — AB (ref 27.0–33.0)
MCHC: 34.8 g/dL (ref 32.0–36.0)
MCV: 97.4 fL (ref 80.0–100.0)
MONOS PCT: 11.6 %
MPV: 10.5 fL (ref 7.5–12.5)
Neutro Abs: 4315 cells/uL (ref 1500–7800)
Neutrophils Relative %: 46.9 %
PLATELETS: 218 10*3/uL (ref 140–400)
RBC: 5.48 10*6/uL (ref 4.20–5.80)
RDW: 13.4 % (ref 11.0–15.0)
TOTAL LYMPHOCYTE: 36.6 %
WBC mixed population: 1067 cells/uL — ABNORMAL HIGH (ref 200–950)
WBC: 9.2 10*3/uL (ref 3.8–10.8)

## 2018-07-30 LAB — TSH: TSH: 2.37 u[IU]/mL (ref 0.35–4.50)

## 2018-07-30 NOTE — Assessment & Plan Note (Signed)
Weight loss: Weight 07-2017 248, today 238, lost 10 pounds in a year. Probably multifactorial including the fact that he started Jardiance 02/2017 and also he stopped testosterone about a month ago. ROS and physical exam is benign except that he is very busy taking care of his wife with dementia and a question of a slightly enlarged lymph node at the left supraclavicular area.  Labs reviewed. Plan: Chest x-ray, TSH, peripheral blood smear. Neuropathy: Reports "cold feet", uncomfortable sensation of the feet; sxs  disturbing to him, good pedal pulses, no vascular insufficiency on exam.  Symptoms are probably related to neuropathy.  Check T21, folic acid, B1.  Offered gabapentin. Caregiver fatigue?  That may be playing a role, information about support provided, see AVS RTC 3 months

## 2018-07-31 ENCOUNTER — Telehealth: Payer: Self-pay | Admitting: Internal Medicine

## 2018-07-31 MED ORDER — GABAPENTIN 100 MG PO CAPS: 100.0000 mg | ORAL_CAPSULE | Freq: Three times a day (TID) | ORAL | 1 refills | Status: DC

## 2018-07-31 NOTE — Telephone Encounter (Signed)
Patient is requesting Rx discussed at 11/12 appointment: Gabapentin. He would like to try it.

## 2018-07-31 NOTE — Telephone Encounter (Signed)
Please advise 

## 2018-07-31 NOTE — Telephone Encounter (Signed)
Rx sent to [facility]. LMOM informing Pt that Rx has been sent and to watch for side effects.

## 2018-07-31 NOTE — Telephone Encounter (Signed)
Copied from [facility] [id number]. Topic: General - Other >> Jul 31, 2018 10:24 AM Jonathan [name] wrote: Reason for CRM:  Patient called to say that when he saw Dr Larose Kells on 07/29/18 they talked about him getting an Rx sent to the Pharmacy to help with Neuropathy and he declined at the time but stated that since then he has been having extra issues with it. He is now asking for an Rx to be sent to the [facility] [place] [phone] (Phone) [phone] (Fax) also request [name] call back. Ph# [phone]

## 2018-07-31 NOTE — Telephone Encounter (Signed)
Send gabapentin 100 mg: 1 tablet 3 times daily #90, 1 refill. Advise patient to watch for excessive sedation.

## 2018-08-02 LAB — VITAMIN B1: Vitamin B1 (Thiamine): 26 nmol/L (ref 8–30)

## 2018-08-05 NOTE — Addendum Note (Signed)
Addended byDamita Dunnings D on: 08/05/2018 03:36 PM   Modules accepted: Orders

## 2018-08-07 ENCOUNTER — Other Ambulatory Visit: Payer: Medicare Other

## 2018-08-08 ENCOUNTER — Telehealth: Payer: Self-pay | Admitting: Hematology & Oncology

## 2018-08-08 NOTE — Telephone Encounter (Signed)
Spoke with patient to confirm new patient appt 08/19/18 at 12 pm. Pt aware of date/time/location

## 2018-08-11 ENCOUNTER — Encounter: Payer: Self-pay | Admitting: Internal Medicine

## 2018-08-11 ENCOUNTER — Ambulatory Visit: Payer: Medicare Other | Admitting: Internal Medicine

## 2018-08-11 ENCOUNTER — Ambulatory Visit (INDEPENDENT_AMBULATORY_CARE_PROVIDER_SITE_OTHER): Payer: Medicare Other | Admitting: Internal Medicine

## 2018-08-11 ENCOUNTER — Telehealth: Payer: Self-pay | Admitting: Internal Medicine

## 2018-08-11 DIAGNOSIS — J449 Chronic obstructive pulmonary disease, unspecified: Secondary | ICD-10-CM | POA: Diagnosis not present

## 2018-08-11 DIAGNOSIS — J9611 Chronic respiratory failure with hypoxia: Secondary | ICD-10-CM | POA: Diagnosis not present

## 2018-08-11 DIAGNOSIS — I2510 Atherosclerotic heart disease of native coronary artery without angina pectoris: Secondary | ICD-10-CM

## 2018-08-11 NOTE — Progress Notes (Signed)
HPI male former smoker followed for COPD, chronic respiratory failure with hypoxia, complicated by CAD/MI/ Stents/ PAD, DM2, polycythemia PFT- 07/10/11-  Normal spirometry flows with small- airway response to bronchodilator, normal lung volumes and moderately reduced diffusion. FEV1/FVC 0.79, DLCO 58% 6MWT -06/18/12- 96%, 88%, 94% 449 M. Significant desaturation with exercise. Overnight Oximetry 02/07/17-room air-sustained desaturation less than or equal to 88% for over 7 hours --------------------------------------------------------------------------------------------------------------------- 04/08/2018- 83 year old male former smoker followed for COPD, chronic respiratory failure with hypoxia,, complicated by CAD/MI/ stents/ PAD, DM2, polycythemia O2 2 L/APS-sleep and exertion Polycythemia with hemoglobin 18.8 recorded May/31st.  On testosterone. He was not aware of elevated hemoglobin-we discussed.  Admits he "sometimes" does not wear his oxygen at night.  Still uses Spiriva.  Little cough, no wheeze and not much aware of dyspnea on exertion.  Mentions cramps right medial thigh recently with history of previous stent.  He will discuss this with his cardiologist.  08/11/2018-  82 year old male former smoker followed for COPD, chronic respiratory failure with hypoxia,, complicated by CAD/MI/ stents/ PAD, DM2, polycythemia O2 2 L/APS-sleep and exertion He missed his appointment for PFT today and is rescheduled. 4 month follow up for COPD. States his POC stopped working and [employer] picked up his machine over 2 months ago. Has not heard back from them.  Dr. Larose Kells, PCP, has evaluated for weight loss with consideration of "caregiver fatigue"-he takes care of demented wife at home. We are checking with [employer]- he says they picked up his POC for repair 2 months ago and never returned it.  Bevespi,     UTD flu vax Sleeps better with oxygen.  Dyspnea on exertion-hills and stairs.  Tries to walk some on  treadmill..  Not using Bevespi much-discussed.  Denies wheeze or cough. CXR 07/29/2018- No evidence of active disease.  Stable compared to June 2018 exam. Walk Test-08/11/2018-  ROS-see HPI  + = positive Constitutional:   No-   weight loss, night sweats, fevers, chills, fatigue, lassitude. HEENT:   No-  headaches, difficulty swallowing, tooth/dental problems, sore throat,       No-  sneezing, itching, ear ache, nasal congestion, post nasal drip,  CV:  No-   chest pain, orthopnea, PND, swelling in lower extremities, anasarca, dizziness, palpitations,                 ? Claudication R thigh Resp:   + "shortness of breath with exertion or at rest.              No-   productive cough,  No non-productive cough,  No- coughing up of blood.              No-   change in color of mucus.   wheezing.   Skin: No-   rash or lesions. GI:  No-   heartburn, indigestion, abdominal pain, nausea, vomiting, GU:  MS:  No-   joint pain or swelling.   Neuro-     nothing unusual Psych:  No- change in mood or affect. No depression or anxiety.  No memory loss.  OBJ- Physical Exam     General- Alert, Oriented, Affect-appropriate, Distress- none acute.  Skin- rash-none, lesions- none, excoriation- none Lymphadenopathy- ?  Small left supraclavicular node, soft, mobile Head- atraumatic            Eyes- Gross vision intact, PERRLA, conjunctivae and secretions clear            Ears- Hearing, canals-normal  Nose- Clear, no-Septal dev, mucus, polyps, erosion, perforation             Throat- Mallampati II , mucosa clear , drainage- none, tonsils- atrophic Neck- flexible , trachea midline, no stridor , thyroid nl, carotid no bruit Chest - symmetrical excursion , unlabored           Heart/CV- RRR , no murmur , no gallop  , no rub, nl s1 s2                           - JVD+ 1 cm , edema- none, stasis changes- none, varices- none           Lung- + clear, cough-none , dullness-none, rub- none, + slightly breathless  on conversation with room air           Chest wall-  Abd-  Br/ Gen/ Rectal- Not done, not indicated Extrem- cyanosis- none,  atrophy- none, strength- nl  Neuro- grossly intact to observation

## 2018-08-11 NOTE — Telephone Encounter (Signed)
Patient came in today for an appointment with CY. Per patient, he was having issues with his POC from [employer]. He contacted [employer] and they offered to repair the machine by sending it back to the manufacturer. Per the patient, they did not supply him with a loaner POC. He stated that he was told that the repair would take up to 1.5 months.   Spoke with Jeani Hawking at [employer]. She checked into patient's chart. It appears that the patient did request service to his POC back in September/October and they provided him with a new a machine. She stated that the patient is simply trying to get his old POC back and keep the new POC as well. Advised her that the patient stated that he has "nothing at home". She advised that per his chart in their system, he has a POC as well as a concentrator for night use.   Left message for patient to call back to verify this.

## 2018-08-11 NOTE — Patient Instructions (Addendum)
Keep rescheduled appointment for PFT  Order- walk test on room Air- O2 qualifying   Dx COPD mixed type  Please call if we can help

## 2018-08-12 ENCOUNTER — Ambulatory Visit: Payer: Medicare Other | Admitting: Endocrinology

## 2018-08-13 ENCOUNTER — Ambulatory Visit (INDEPENDENT_AMBULATORY_CARE_PROVIDER_SITE_OTHER): Payer: Medicare Other | Admitting: Internal Medicine

## 2018-08-13 DIAGNOSIS — J449 Chronic obstructive pulmonary disease, unspecified: Secondary | ICD-10-CM

## 2018-08-13 LAB — PULMONARY FUNCTION TEST
DL/VA % pred: 63 %
DL/VA: 2.96 ml/min/mmHg/L
DLCO UNC % PRED: 43 %
DLCO UNC: 15.32 ml/min/mmHg
FEF 25-75 PRE: 2.11 L/s
FEF2575-%PRED-PRE: 93 %
FEV1-%Pred-Pre: 86 %
FEV1-PRE: 2.51 L
FEV1FVC-%Pred-Pre: 101 %
FEV6-%PRED-PRE: 87 %
FEV6-PRE: 3.28 L
FEV6FVC-%PRED-PRE: 104 %
FVC-%Pred-Pre: 83 %
FVC-Pre: 3.3 L
PRE FEV1/FVC RATIO: 76 %
PRE FEV6/FVC RATIO: 99 %
RV % PRED: 89 %
RV: 2.47 L
TLC % pred: 82 %
TLC: 6.15 L

## 2018-08-13 NOTE — Progress Notes (Signed)
Patient completed pre spiro, dlco and pleth today. Pt refused to take xopenex or albuterol treatment today to complete the post spiro. Therefore there is no post spiro.

## 2018-08-18 ENCOUNTER — Other Ambulatory Visit: Payer: Self-pay | Admitting: Hematology

## 2018-08-18 DIAGNOSIS — D751 Secondary polycythemia: Secondary | ICD-10-CM

## 2018-08-18 NOTE — Progress Notes (Signed)
[place] NOTE  Patient Care Team: Colon Branch, MD as PCP - General Dorothy Spark, MD as PCP - Cardiology (Cardiology) Elayne Snare, MD as Consulting Physician (Endocrinology) Deneise Lever, MD as Consulting Physician (Pulmonary Disease) Clent Jacks, MD as Consulting Physician (Ophthalmology) Camelia Phenes, DPM as Consulting Physician (Podiatry) Dorothy Spark, MD as Consulting Physician (Cardiology) Carolan Clines, MD as Consulting Physician (Urology)  HEME/ONC OVERVIEW: 1. Chronic polycythemia -Hgb between 17 and 18 dating back to 2012   -Likely due to COPD and exogenous testosterone use   PERTINENT NON-HEM/ONC PROBLEMS 1. Chronic hypoxic respiratory failure on supplement O2 due to COPD  2. Hx of hypogonadism on testosterone cream for many years, stopped in late 2019   ASSESSMENT & PLAN:   Chronic polycythemia -I reviewed the patient's records in detail, including PCP clinic notes and labs dating back to 2012 -In summary, patient has had chronic stable polycythemia (Hgb between 17 and 18) dating back to 2012; no concurrent leukocytosis or thrombocytosis -Patient is on topical testosterone for hypogonadism -Iron profile pending today -I reviewed his peripheral blood smear, which showed normal RBC size and the morphology; there were no schistocytes -In light of patient's chronic hypoxic respiratory failure and chronic testosterone cream use, the etiology of polycythemia is most likely secondary to chronic hypoxia and exogenous testosterone use  -In the absence of concurrent leukocytosis or thrombocytosis, primary hematologic disorder, such as polycythemia vera or essential thrombocythemia, is much less likely -If iron profile shows significant iron overload, gene panel to rule out hemochromatosis may be beneficial -In the setting of secondary polycythemia, phlebotomy can exacerbate underlying conditions, such as chronic hypoxic respiratory  failure, and is therefore not indicated  Chronic hypoxic respiratory failure -PFT's in 07/2018 showed severe diffuse restriction, but minimal obstruction -On supplemental [place] at night and PRN with exertion  -I will defer the management to PCP   Hypogonadism -Patient was on testosterone cream for many years, but recently stopped in the past one to two months  -I counseled the patient on avoiding testosterone hormone use, as it can further exacerbate polycythemia   Orders Placed This Encounter  Procedures  . CBC with Differential (Cancer Center Only)    Standing Status:   Future    Standing Expiration Date:   09/23/2019  . CMP ([facility] only)    Standing Status:   Future    Standing Expiration Date:   09/23/2019    A total of more than 45 minutes were spent face-to-face with the patient during this encounter and over half of that time was spent on counseling and coordination of care as outlined above.    All questions were answered. The patient knows to call the clinic with any problems, questions or concerns.  Return in 3 months for labs and clinic follow-up.   Tish Men, MD 08/19/2018 1:00 PM   CHIEF COMPLAINTS/PURPOSE OF CONSULTATION:  "I am here for high blood count"  HISTORY OF PRESENTING ILLNESS:  Jonathan [name]. 82 y.o. male is here because of persistent polycythemia.  Patient has had a chronic polycythemia dating back to 2012 with hemoglobin between 17 and 18.  His hemoglobin was briefly normal in June 2018, during which.  He had an apparent GI bleed.  Since then, his hemoglobin has returned back to baseline.  Patient reports that he has been taking testosterone cream for many years due to hypogonadism, but he recently stopped at the instruction of his physician 1  to 2 months ago for unclear reason.  He has history of heavy tobacco use (2 pack/day for 35 years), but quit in 1977.  He has COPD, for which he wears supplemental O2 at night and as needed with exertion.  He  denies any snoring at night, history of sleep apnea, or wearing CPAP.  He has chronic, stable exertional dyspnea and anxiety associated with taking care of his wife, who has severe dementia, denies any fever, chill, night sweats, significant weight change, adenopathy pruritus, or chest pain.  MEDICAL HISTORY:  Past Medical History:  Diagnosis Date  . CAD (coronary artery disease)    Two RCA stents remotely / 3rd RCA stent 2006  . Colon polyps    s/p several Cscopes.  Marland Kitchen COPD (chronic obstructive pulmonary disease) (HCC)    on O2, nocturnal  . Diabetes mellitus    dx aprox 2009  . ED (erectile dysfunction)    has a vacumm device  . Ejection fraction   . Hyperlipidemia    dx in 90s  . Hypertension    dx in the 90  . Hypogonadism male   . PVD (peripheral vascular disease) ([place])    s/p stents at LE 2009, Dr Einar Gip  . Shortness of breath    O2 Sat dropped to 82% walking on the treadmill, September, 2012    SURGICAL HISTORY: Past Surgical History:  Procedure Laterality Date  . APPENDECTOMY    . TONSILLECTOMY      SOCIAL HISTORY: Social History   Socioeconomic History  . Marital status: Married    Spouse name: Cerrella   . Number of children: 6  . Years of education: Not on file  . Highest education level: Not on file  Occupational History  . 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 Financial resource strain: Not on file  . Food insecurity:    Worry: Not on file    Inability: Not on file  . Transportation needs:    Medical: Not on file    Non-medical: Not on file  Tobacco Use  . Smoking status: Former Smoker    Packs/day: 2.00    Years: 30.00    Pack years: 60.00    Types: Cigarettes    Last attempt to quit: 06/17/1977    Years since quitting: 41.2  . Smokeless tobacco: Never Used  . Tobacco comment: 2 ppd, quit 1987  Substance and Sexual Activity  . Alcohol use: Yes    Alcohol/week: 0.0 standard drinks    Comment:  socially   . Drug use: No  . Sexual activity: Yes  Lifestyle  . Physical activity:    Days per week: Not on file    Minutes per session: Not on file  . Stress: Not on file  Relationships  . Social connections:    Talks on phone: Not on file    Gets together: Not on file    Attends religious service: Not on file    Active member of club or organization: Not on file    Attends meetings of clubs or organizations: Not on file    Relationship status: Not on file  . Intimate partner violence:    Fear of current or ex partner: Not on file    Emotionally abused: Not on file    Physically abused: Not on file    Forced sexual activity: Not on file  Other Topics Concern  . Not on file  Social  History Narrative   4 children (lost 1 son)   Wife has 2 children                 FAMILY HISTORY: Family History  Problem Relation Age of Onset  . Heart disease Father   . Hypertension Father   . Stroke Father   . Diabetes Paternal Aunt   . Diabetes Maternal Grandmother   . Diabetes Other        GM, nephews, many family members  . Hyperlipidemia Other        ?  . Prostate cancer Brother   . Colon cancer Neg Hx     ALLERGIES:  has No Known Allergies.  MEDICATIONS:  Current Outpatient Medications  Medication Sig Dispense Refill  . amLODipine (NORVASC) 10 MG tablet TAKE 1 TABLET DAILY 90 tablet 3  . aspirin 81 MG tablet Take 81 mg by mouth daily.      . Blood Glucose Monitoring Suppl (FREESTYLE LITE) DEVI 1 each by Does not apply route daily. Use as instructed to check blood sugar twice daily.  DX: E11.9 1 each each  . empagliflozin (JARDIANCE) 10 MG TABS tablet Take 10 mg by mouth daily. 90 tablet 3  . glucose blood (FREESTYLE LITE) test strip Use as instructed to check blood sugar twice daily. 100 each 12  . Lancets (FREESTYLE) lancets USE AS INSTRUCTED TO CHECK BLOOD SUGAR TWICE A DAY 200 each 1  . losartan (COZAAR) 100 MG tablet Take 1 tablet (100 mg total) by mouth daily. 90  tablet 1  . metFORMIN (GLUCOPHAGE) 1000 MG tablet Take 1,000 mg by mouth daily.    . metoprolol succinate (TOPROL XL) 100 MG 24 hr tablet Take 1 tablet (100 mg total) by mouth daily. 90 tablet 2  . Multiple Vitamin (MULTIVITAMIN WITH MINERALS) TABS tablet Take 1 tablet by mouth daily.    . simvastatin (ZOCOR) 20 MG tablet Take 0.5 tablets (10 mg total) by mouth at bedtime.    . triamterene-hydrochlorothiazide (MAXZIDE-25) 37.5-25 MG tablet Take 0.5 tablets by mouth daily.    . TRULICITY 1.5 VO/1.6WV SOPN INJECT 1.5 MG INTO THE SKIN WEEKLY 6 mL 1  . gabapentin (NEURONTIN) 100 MG capsule Take 1 capsule (100 mg total) by mouth 3 (three) times daily. (Patient not taking: Reported on 08/19/2018) 90 capsule 1   No current facility-administered medications for this visit.     REVIEW OF SYSTEMS:   Constitutional: ( - ) fevers, ( - )  chills , ( - ) night sweats Eyes: ( - ) blurriness of vision, ( - ) double vision, ( - ) watery eyes Ears, nose, mouth, throat, and face: ( - ) mucositis, ( - ) sore throat Respiratory: ( - ) cough, ( - ) dyspnea, ( - ) wheezes Cardiovascular: ( - ) palpitation, ( - ) chest discomfort, ( - ) lower extremity swelling Gastrointestinal:  ( - ) nausea, ( - ) heartburn, ( - ) change in bowel habits Skin: ( - ) abnormal skin rashes Lymphatics: ( - ) new lymphadenopathy, ( - ) easy bruising Neurological: ( - ) numbness, ( - ) tingling, ( - ) new weaknesses Behavioral/Psych: ( - ) mood change, ( - ) new changes  All other systems were reviewed with the patient and are negative.  PHYSICAL EXAMINATION: ECOG PERFORMANCE STATUS: 0 - Asymptomatic  Vitals:   08/19/18 1224  BP: 128/67  Pulse: 74  Resp: 18  Temp: 97.6 F (36.4 C)  SpO2: 96%  Filed Weights   08/19/18 1224  Weight: 238 lb (108 kg)    GENERAL: alert, no distress and comfortable SKIN: skin color, texture, turgor are normal, no rashes or significant lesions EYES: conjunctiva are pink and non-injected,  sclera clear OROPHARYNX: no exudate, no erythema; lips, buccal mucosa, and tongue normal  NECK: supple, non-tender LYMPH:  no palpable lymphadenopathy in the cervical or axillary LUNGS: clear to auscultation and percussion with normal breathing effort HEART: regular rate & rhythm, no murmurs, no lower extremity edema ABDOMEN: soft, non-tender, non-distended, normal bowel sounds Musculoskeletal: no cyanosis of digits and no clubbing  PSYCH: alert & oriented x 3, fluent speech NEURO: no focal motor/sensory deficits  LABORATORY DATA:  I have reviewed the data as listed Lab Results  Component Value Date   WBC 9.8 08/19/2018   HGB 17.8 (H) 08/19/2018   HCT 54.4 (H) 08/19/2018   MCV 99.3 08/19/2018   PLT 200 08/19/2018   Lab Results  Component Value Date   NA 140 08/19/2018   K 4.0 08/19/2018   CL 106 08/19/2018   CO2 25 08/19/2018    RADIOGRAPHIC STUDIES: I have personally reviewed the radiological images as listed and agreed with the findings in the report. Dg Chest 2 View  Result Date: 07/30/2018 CLINICAL DATA:  Unintentional weight loss EXAM: CHEST - 2 VIEW COMPARISON:  02/15/2017 FINDINGS: Interstitial coarsening at the bases that was also seen previously. There is no consolidation, effusion, or pneumothorax. Normal heart size and mediastinal contours. No osseous findings IMPRESSION: No evidence of active disease.  Stable compared to June 2018 exam. Electronically Signed   By: Monte Fantasia M.D.   On: 07/30/2018 08:37    PATHOLOGY: I personally reviewed the patient's peripheral blood smear today.  The red blood cells were of normal morphology.  There was no schistocytosis.  The white blood cells were of normal morphology. There were no peripheral circulating blasts. The platelets were of normal size and I verified that there were no platelet clumping.

## 2018-08-19 ENCOUNTER — Encounter: Payer: Self-pay | Admitting: Hematology

## 2018-08-19 ENCOUNTER — Inpatient Hospital Stay: Payer: Medicare Other

## 2018-08-19 ENCOUNTER — Inpatient Hospital Stay: Payer: Medicare Other | Attending: Hematology | Admitting: Hematology

## 2018-08-19 VITALS — BP 128/67 | HR 74 | Temp 97.6°F | Resp 18 | Ht 74.0 in | Wt 238.0 lb

## 2018-08-19 DIAGNOSIS — E291 Testicular hypofunction: Secondary | ICD-10-CM | POA: Insufficient documentation

## 2018-08-19 DIAGNOSIS — Z79899 Other long term (current) drug therapy: Secondary | ICD-10-CM | POA: Diagnosis not present

## 2018-08-19 DIAGNOSIS — Z7984 Long term (current) use of oral hypoglycemic drugs: Secondary | ICD-10-CM | POA: Insufficient documentation

## 2018-08-19 DIAGNOSIS — Z794 Long term (current) use of insulin: Secondary | ICD-10-CM

## 2018-08-19 DIAGNOSIS — D751 Secondary polycythemia: Secondary | ICD-10-CM | POA: Insufficient documentation

## 2018-08-19 DIAGNOSIS — I10 Essential (primary) hypertension: Secondary | ICD-10-CM | POA: Diagnosis not present

## 2018-08-19 DIAGNOSIS — I739 Peripheral vascular disease, unspecified: Secondary | ICD-10-CM | POA: Insufficient documentation

## 2018-08-19 DIAGNOSIS — Z7982 Long term (current) use of aspirin: Secondary | ICD-10-CM

## 2018-08-19 DIAGNOSIS — E785 Hyperlipidemia, unspecified: Secondary | ICD-10-CM

## 2018-08-19 DIAGNOSIS — J9611 Chronic respiratory failure with hypoxia: Secondary | ICD-10-CM | POA: Insufficient documentation

## 2018-08-19 DIAGNOSIS — Z86010 Personal history of colonic polyps: Secondary | ICD-10-CM | POA: Diagnosis not present

## 2018-08-19 DIAGNOSIS — J449 Chronic obstructive pulmonary disease, unspecified: Secondary | ICD-10-CM | POA: Diagnosis not present

## 2018-08-19 DIAGNOSIS — E119 Type 2 diabetes mellitus without complications: Secondary | ICD-10-CM

## 2018-08-19 DIAGNOSIS — Z87891 Personal history of nicotine dependence: Secondary | ICD-10-CM | POA: Insufficient documentation

## 2018-08-19 DIAGNOSIS — I2510 Atherosclerotic heart disease of native coronary artery without angina pectoris: Secondary | ICD-10-CM | POA: Insufficient documentation

## 2018-08-19 DIAGNOSIS — Z8042 Family history of malignant neoplasm of prostate: Secondary | ICD-10-CM | POA: Insufficient documentation

## 2018-08-19 LAB — CBC WITH DIFFERENTIAL (CANCER CENTER ONLY)
Abs Immature Granulocytes: 0.02 10*3/uL (ref 0.00–0.07)
Basophils Absolute: 0 10*3/uL (ref 0.0–0.1)
Basophils Relative: 0 %
Eosinophils Absolute: 0.3 10*3/uL (ref 0.0–0.5)
Eosinophils Relative: 3 %
HCT: 54.4 % — ABNORMAL HIGH (ref 39.0–52.0)
Hemoglobin: 17.8 g/dL — ABNORMAL HIGH (ref 13.0–17.0)
Immature Granulocytes: 0 %
LYMPHS PCT: 34 %
Lymphs Abs: 3.3 10*3/uL (ref 0.7–4.0)
MCH: 32.5 pg (ref 26.0–34.0)
MCHC: 32.7 g/dL (ref 30.0–36.0)
MCV: 99.3 fL (ref 80.0–100.0)
Monocytes Absolute: 1 10*3/uL (ref 0.1–1.0)
Monocytes Relative: 10 %
Neutro Abs: 5.1 10*3/uL (ref 1.7–7.7)
Neutrophils Relative %: 53 %
Platelet Count: 200 10*3/uL (ref 150–400)
RBC: 5.48 MIL/uL (ref 4.22–5.81)
RDW: 13.3 % (ref 11.5–15.5)
WBC Count: 9.8 10*3/uL (ref 4.0–10.5)
nRBC: 0 % (ref 0.0–0.2)

## 2018-08-19 LAB — CMP (CANCER CENTER ONLY)
ALT: 20 U/L (ref 0–44)
AST: 16 U/L (ref 15–41)
Albumin: 4.1 g/dL (ref 3.5–5.0)
Alkaline Phosphatase: 60 U/L (ref 38–126)
Anion gap: 9 (ref 5–15)
BUN: 17 mg/dL (ref 8–23)
CALCIUM: 9.6 mg/dL (ref 8.9–10.3)
CO2: 25 mmol/L (ref 22–32)
Chloride: 106 mmol/L (ref 98–111)
Creatinine: 1.31 mg/dL — ABNORMAL HIGH (ref 0.61–1.24)
GFR, Est AFR Am: 59 mL/min — ABNORMAL LOW (ref >60)
GFR, Estimated: 51 mL/min — ABNORMAL LOW (ref >60)
Glucose, Bld: 127 mg/dL — ABNORMAL HIGH (ref 70–99)
Potassium: 4 mmol/L (ref 3.5–5.1)
Sodium: 140 mmol/L (ref 135–145)
Total Bilirubin: 0.7 mg/dL (ref 0.3–1.2)
Total Protein: 7 g/dL (ref 6.5–8.1)

## 2018-08-19 LAB — SAVE SMEAR(SSMR), FOR PROVIDER SLIDE REVIEW

## 2018-08-19 LAB — LACTATE DEHYDROGENASE: LDH: 172 U/L (ref 98–192)

## 2018-08-20 LAB — ERYTHROPOIETIN: Erythropoietin: 12.3 m[IU]/mL (ref 2.6–18.5)

## 2018-08-20 LAB — IRON AND TIBC
Iron: 91 ug/dL (ref 42–163)
Saturation Ratios: 34 % (ref 20–55)
TIBC: 264 ug/dL (ref 202–409)
UIBC: 173 ug/dL (ref 117–376)

## 2018-08-20 LAB — FERRITIN: FERRITIN: 160 ng/mL (ref 24–336)

## 2018-08-22 ENCOUNTER — Other Ambulatory Visit: Payer: Self-pay | Admitting: Endocrinology

## 2018-08-29 ENCOUNTER — Telehealth: Payer: Self-pay

## 2018-08-29 ENCOUNTER — Other Ambulatory Visit (INDEPENDENT_AMBULATORY_CARE_PROVIDER_SITE_OTHER): Payer: Medicare Other

## 2018-08-29 DIAGNOSIS — E1165 Type 2 diabetes mellitus with hyperglycemia: Secondary | ICD-10-CM | POA: Diagnosis not present

## 2018-08-29 DIAGNOSIS — E291 Testicular hypofunction: Secondary | ICD-10-CM

## 2018-08-29 LAB — CBC
HCT: 53.5 % — ABNORMAL HIGH (ref 39.0–52.0)
Hemoglobin: 18 g/dL (ref 13.0–17.0)
MCHC: 33.7 g/dL (ref 30.0–36.0)
MCV: 100 fl (ref 78.0–100.0)
Platelets: 205 10*3/uL (ref 150.0–400.0)
RBC: 5.35 Mil/uL (ref 4.22–5.81)
RDW: 14.5 % (ref 11.5–15.5)
WBC: 7.3 10*3/uL (ref 4.0–10.5)

## 2018-08-29 LAB — BASIC METABOLIC PANEL
BUN: 22 mg/dL (ref 6–23)
CO2: 26 mEq/L (ref 19–32)
Calcium: 9.5 mg/dL (ref 8.4–10.5)
Chloride: 104 mEq/L (ref 96–112)
Creatinine, Ser: 1.38 mg/dL (ref 0.40–1.50)
GFR: 63.68 mL/min (ref >60.00)
Glucose, Bld: 167 mg/dL — ABNORMAL HIGH (ref 70–99)
POTASSIUM: 4.3 meq/L (ref 3.5–5.1)
Sodium: 139 mEq/L (ref 135–145)

## 2018-08-29 NOTE — Telephone Encounter (Signed)
This is being addressed by hematologist

## 2018-08-29 NOTE — Telephone Encounter (Signed)
Main lab just called with a critical hemoglobin of 18.0.

## 2018-08-31 LAB — TESTOSTERONE, FREE, TOTAL, SHBG
Sex Hormone Binding: 47.3 nmol/L (ref 19.3–76.4)
TESTOSTERONE: 328 ng/dL (ref 264–916)
Testosterone, Free: 8.5 pg/mL (ref 6.6–18.1)

## 2018-09-02 ENCOUNTER — Encounter: Payer: Self-pay | Admitting: Endocrinology

## 2018-09-02 ENCOUNTER — Telehealth: Payer: Self-pay

## 2018-09-02 ENCOUNTER — Ambulatory Visit (INDEPENDENT_AMBULATORY_CARE_PROVIDER_SITE_OTHER): Payer: Medicare Other | Admitting: Endocrinology

## 2018-09-02 VITALS — BP 132/82 | HR 61 | Ht 74.0 in | Wt 240.2 lb

## 2018-09-02 DIAGNOSIS — I2510 Atherosclerotic heart disease of native coronary artery without angina pectoris: Secondary | ICD-10-CM

## 2018-09-02 DIAGNOSIS — E1142 Type 2 diabetes mellitus with diabetic polyneuropathy: Secondary | ICD-10-CM

## 2018-09-02 LAB — POCT GLYCOSYLATED HEMOGLOBIN (HGB A1C): Hemoglobin A1C: 6.2 % — AB (ref 4.0–5.6)

## 2018-09-02 NOTE — Telephone Encounter (Signed)
error 

## 2018-09-02 NOTE — Progress Notes (Signed)
Patient ID: Jonathan Leather., male   DOB: 1935-10-22, 82 y.o.   MRN: [id number]    Reason for Appointment:  Follow-up  History of Present Illness:          Diagnosis: Type 2 diabetes mellitus, date of diagnosis:  2009      Past history: He is not clear what his diabetes was diagnosed but according to hospital records it may have been in 2009. Most likely he was placed on metformin initially and at some point Amaryl added. In 2013 it was also given Januvia presumably to improve his control. However appears that his A1c has been consistently over 7% Janumet was started instead of metformin and Januvia separately in 2013  He was started on Trulicity in 8/33 instead of Victoza because excessive bruising on his abdomen  Later in 01/2015 he was switched to Bydureon because of insurance noncoverage of his Trulicity  Recent history:   Non-insulin hypoglycemic drugs are: Metformin 1000 twice a day, Trulicity 1.5 mg weekly, Jardiance 10 mg daily  His A1c is 6.2 in March improved, previously 6.9  Current blood sugar patterns and problems:  He has better blood sugar control even with stopping Amaryl on the last visit when he was having mild hypoglycemia  He is still trying to exercise regularly  Diet is usually fairly good  Able to maintain his weight  No side effects from Trulicity which he has taken for some time  He has generally been active  Checking blood sugars infrequently overall but they appear to be stable throughout the day     Side effects from medications have been: None  Glucose monitoring:  done  Once a day       Glucometer:  FreeStyle Blood Glucose readings  by download    PRE-MEAL Fasting Lunch Dinner Bedtime Overall  Glucose range:  108-129  102-141  100, 125  140  100-145  Mean/median:  125   115  120   The self-care:      Meals: 3 meals per day. eating egg/meat bread or Boost for breakfast, dinner 6-7 PM    Exercise:  this is being done 3-4/7  days with cardio for about 60 minutes at HOME at 6 pm  Dietician visit: Most recent: 4/16 .               Weight history:  Wt Readings from Last 3 Encounters:  09/02/18 240 lb 3.2 oz (109 kg)  08/19/18 238 lb (108 kg)  08/11/18 238 lb (108 kg)   Glycemic control:   Lab Results  Component Value Date   HGBA1C 6.2 (A) 09/02/2018   HGBA1C 6.9 (H) 06/12/2018   HGBA1C 6.8 (H) 05/06/2018   Lab Results  Component Value Date   MICROALBUR 4.8 (H) 01/01/2018   LDLCALC 58 06/12/2018   CREATININE 1.38 08/29/2018    Lab Results  Component Value Date   FRUCTOSAMINE 284 03/29/2017   FRUCTOSAMINE 268 12/06/2016   FRUCTOSAMINE 282 02/22/2014    OTHER active problems addressed today: See review of systems  Office Visit on 09/02/2018  Component Date Value Ref Range Status  . Hemoglobin A1C 09/02/2018 6.2* 4.0 - 5.6 % Corrected  Lab on 08/29/2018  Component Date Value Ref Range Status  . Testosterone 08/29/2018 328  264 - 916 ng/dL Final   Comment: Adult male reference interval is based on a population of healthy nonobese males (BMI <30) between 5 and 88 years old. [place], [place] [phone]. PMID:  [id number].   Marland Kitchen Testosterone, Free 08/29/2018 8.5  6.6 - 18.1 pg/mL Final  . Sex Hormone Binding 08/29/2018 47.3  19.3 - 76.4 nmol/L Final  . WBC 08/29/2018 7.3  4.0 - 10.5 K/uL Final  . RBC 08/29/2018 5.35  4.22 - 5.81 Mil/uL Final  . Platelets 08/29/2018 205.0  150.0 - 400.0 K/uL Final  . Hemoglobin 08/29/2018 18.0* 13.0 - 17.0 g/dL Final  . HCT 08/29/2018 53.5* 39.0 - 52.0 % Final  . MCV 08/29/2018 100.0  78.0 - 100.0 fl Final  . MCHC 08/29/2018 33.7  30.0 - 36.0 g/dL Final  . RDW 08/29/2018 14.5  11.5 - 15.5 % Final  . Sodium 08/29/2018 139  135 - 145 mEq/L Final  . Potassium 08/29/2018 4.3  3.5 - 5.1 mEq/L Final  . Chloride 08/29/2018 104  96 - 112 mEq/L Final  . CO2 08/29/2018 26  19 - 32 mEq/L Final  . Glucose, Bld 08/29/2018 167* 70 - 99 mg/dL Final  . BUN  08/29/2018 22  6 - 23 mg/dL Final  . Creatinine, Ser 08/29/2018 1.38  0.40 - 1.50 mg/dL Final  . Calcium 08/29/2018 9.5  8.4 - 10.5 mg/dL Final  . GFR 08/29/2018 63.68  >60.00 mL/min Final     Allergies as of 09/02/2018   No Known Allergies     Medication List       Accurate as of September 02, 2018  9:08 PM. Always use your most recent med list.        amLODipine 10 MG tablet Commonly known as:  NORVASC TAKE 1 TABLET DAILY   aspirin 81 MG tablet Take 81 mg by mouth daily.   empagliflozin 10 MG Tabs tablet Commonly known as:  JARDIANCE Take 10 mg by mouth daily.   freestyle lancets USE AS INSTRUCTED TO CHECK BLOOD SUGAR TWICE A DAY   FREESTYLE LITE Devi 1 each by Does not apply route daily. Use as instructed to check blood sugar twice daily.  DX: E11.9   gabapentin 100 MG capsule Commonly known as:  NEURONTIN Take 1 capsule (100 mg total) by mouth 3 (three) times daily.   glucose blood test strip Commonly known as:  FREESTYLE LITE Use as instructed to check blood sugar twice daily.   losartan 100 MG tablet Commonly known as:  COZAAR Take 1 tablet (100 mg total) by mouth daily.   metFORMIN 1000 MG tablet Commonly known as:  GLUCOPHAGE Take 1,000 mg by mouth daily.   metoprolol succinate 100 MG 24 hr tablet Commonly known as:  TOPROL XL Take 1 tablet (100 mg total) by mouth daily.   multivitamin with minerals Tabs tablet Take 1 tablet by mouth daily.   simvastatin 20 MG tablet Commonly known as:  ZOCOR Take 0.5 tablets (10 mg total) by mouth at bedtime.   triamterene-hydrochlorothiazide 37.5-25 MG tablet Commonly known as:  MAXZIDE-25 Take 0.5 tablets by mouth daily.   TRULICITY 1.5 RF/1.6BW Sopn Generic drug:  Dulaglutide INJECT 1.5 MG INTO THE SKIN WEEKLY       Allergies: No Known Allergies  Past Medical History:  Diagnosis Date  . CAD (coronary artery disease)    Two RCA stents remotely / 3rd RCA stent 2006  . Colon polyps    s/p  several Cscopes.  Marland Kitchen COPD (chronic obstructive pulmonary disease) (HCC)    on O2, nocturnal  . Diabetes mellitus    dx aprox 2009  . ED (erectile dysfunction)    has a vacumm device  . Ejection  fraction   . Hyperlipidemia    dx in 90s  . Hypertension    dx in the 90  . Hypogonadism male   . PVD (peripheral vascular disease) ([place])    s/p stents at LE 2009, Dr Einar Gip  . Shortness of breath    O2 Sat dropped to 82% walking on the treadmill, September, 2012    Past Surgical History:  Procedure Laterality Date  . APPENDECTOMY    . TONSILLECTOMY      Family History  Problem Relation Age of Onset  . Heart disease Father   . Hypertension Father   . Stroke Father   . Diabetes Paternal Aunt   . Diabetes Maternal Grandmother   . Diabetes Other        GM, nephews, many family members  . Hyperlipidemia Other        ?  . Prostate cancer Brother   . Colon cancer Neg Hx     Social History:  reports that he quit smoking about 41 years ago. His smoking use included cigarettes. He has a 60.00 pack-year smoking history. He has never used smokeless tobacco. He reports current alcohol use. He reports that he does not use drugs.    Review of Systems        Lipids: He has been on treatment with simvastatin 20 mg with good control       Lab Results  Component Value Date   CHOL 110 06/12/2018   HDL 35.20 (L) 06/12/2018   LDLCALC 58 06/12/2018   LDLDIRECT 55.0 01/01/2018   TRIG 85.0 06/12/2018   CHOLHDL 3 06/12/2018                   HYPOGONADISM He had decreased libido and erectile dysfunction previously and was found to have hypogonadism, probably in 2011. He did have a slightly low free testosterone level in 2012 on treatment Prolactin level normal  Previously on AndroGel With his hemoglobin going up to 19.1 in August he has been told not to take any testosterone  He says he does not feel any fatigue or change in motivation recently His testosterone level is now back to  normal even without supplements   Lab Results  Component Value Date   TESTOSTERONE 328 08/29/2018   Lab Results  Component Value Date   PSA 0.60 02/14/2018   PSA 0.56 04/02/2016   PSA 0.60 01/20/2014    He has been evaluated by hematologist for his erythrocytosis Although hemoglobin is generally lower it is still not normal with leaving off the testosterone  CBC Latest Ref Rng & Units 08/29/2018 08/19/2018 07/29/2018  WBC 4.0 - 10.5 K/uL 7.3 9.8 9.2  Hemoglobin 13.0 - 17.0 g/dL 18.0(HH) 17.8(H) 18.6(H)  Hematocrit 39.0 - 52.0 % 53.5(H) 54.4(H) 53.4(H)  Platelets 150.0 - 400.0 K/uL 205.0 200 218          HYPERTENSION: This is followed by PCP and is on 100mg  of losartan along with half Maxide, amlodipine and metoprolol    BP Readings from Last 3 Encounters:  09/02/18 132/82  08/19/18 128/67  08/11/18 120/60     LABS:  Office Visit on 09/02/2018  Component Date Value Ref Range Status  . Hemoglobin A1C 09/02/2018 6.2* 4.0 - 5.6 % Corrected  Lab on 08/29/2018  Component Date Value Ref Range Status  . Testosterone 08/29/2018 328  264 - 916 ng/dL Final   Comment: Adult male reference interval is based on a population of healthy nonobese males (BMI <30)  between 41 and 71 years old. [place], [place] [phone]. PMID: [id number].   Marland Kitchen Testosterone, Free 08/29/2018 8.5  6.6 - 18.1 pg/mL Final  . Sex Hormone Binding 08/29/2018 47.3  19.3 - 76.4 nmol/L Final  . WBC 08/29/2018 7.3  4.0 - 10.5 K/uL Final  . RBC 08/29/2018 5.35  4.22 - 5.81 Mil/uL Final  . Platelets 08/29/2018 205.0  150.0 - 400.0 K/uL Final  . Hemoglobin 08/29/2018 18.0* 13.0 - 17.0 g/dL Final  . HCT 08/29/2018 53.5* 39.0 - 52.0 % Final  . MCV 08/29/2018 100.0  78.0 - 100.0 fl Final  . MCHC 08/29/2018 33.7  30.0 - 36.0 g/dL Final  . RDW 08/29/2018 14.5  11.5 - 15.5 % Final  . Sodium 08/29/2018 139  135 - 145 mEq/L Final  . Potassium 08/29/2018 4.3  3.5 - 5.1 mEq/L Final  . Chloride 08/29/2018 104   96 - 112 mEq/L Final  . CO2 08/29/2018 26  19 - 32 mEq/L Final  . Glucose, Bld 08/29/2018 167* 70 - 99 mg/dL Final  . BUN 08/29/2018 22  6 - 23 mg/dL Final  . Creatinine, Ser 08/29/2018 1.38  0.40 - 1.50 mg/dL Final  . Calcium 08/29/2018 9.5  8.4 - 10.5 mg/dL Final  . GFR 08/29/2018 63.68  >60.00 mL/min Final    Physical Examination:  BP 132/82 (BP Location: Left Arm, Patient Position: Sitting, Cuff Size: Normal)   Pulse 61   Ht 6\' 2"  (1.88 m)   Wt 240 lb 3.2 oz (109 kg)   SpO2 95%   BMI 30.84 kg/m       ASSESSMENT/PLAN:   ERYTHROCYTOSIS  His hemoglobin is improving but still not back to normal with stopping testosterone supplements Most likely he has some degree of primary erythrocytosis and is going to continue follow-up with hematologist  Hypogonadism: Despite his stopping testosterone supplement his level is back to normal and he is asymptomatic Likely will not need any further supplementation also especially considering his age   Diabetes type 2, with obesity  His A1c is 6.2 Even with stopping Amaryl his control is excellent He will continue Trulicity and metformin  Recent heartburn symptoms: Discussed that this is unlikely to be from Trulicity which she has taken for a while and he can take OTC Pepcid or liquid Mylanta  There are no Patient Instructions on file for this visit.    Elayne Snare 09/02/2018, 9:08 PM   Note: This office note was prepared with Dragon voice recognition system technology. Any transcriptional errors that result from this process are unintentional.

## 2018-10-14 NOTE — Assessment & Plan Note (Signed)
We will reestablish baselines for his lung function and oxygen dependence. Plan-walk test on room air, schedule PFT continue Bevespi-use discussed.

## 2018-10-14 NOTE — Assessment & Plan Note (Signed)
He does not seem to feel much need for Bevespi.  No acute exacerbation and no obvious infection at this time. Plan-PFT pending

## 2018-10-17 ENCOUNTER — Encounter: Payer: Self-pay | Admitting: Internal Medicine

## 2018-10-17 ENCOUNTER — Ambulatory Visit (INDEPENDENT_AMBULATORY_CARE_PROVIDER_SITE_OTHER): Payer: Medicare Other | Admitting: Internal Medicine

## 2018-10-17 VITALS — BP 132/80 | HR 84 | Temp 98.2°F | Resp 16 | Ht 74.0 in | Wt 238.2 lb

## 2018-10-17 DIAGNOSIS — D751 Secondary polycythemia: Secondary | ICD-10-CM

## 2018-10-17 DIAGNOSIS — R634 Abnormal weight loss: Secondary | ICD-10-CM

## 2018-10-17 DIAGNOSIS — I10 Essential (primary) hypertension: Secondary | ICD-10-CM | POA: Diagnosis not present

## 2018-10-17 DIAGNOSIS — E782 Mixed hyperlipidemia: Secondary | ICD-10-CM | POA: Diagnosis not present

## 2018-10-17 DIAGNOSIS — Z23 Encounter for immunization: Secondary | ICD-10-CM

## 2018-10-17 MED ORDER — ZOSTER VAC RECOMB ADJUVANTED 50 MCG/0.5ML IM SUSR: 0.5000 mL | Freq: Once | INTRAMUSCULAR | 1 refills | Status: AC

## 2018-10-17 NOTE — Progress Notes (Signed)
Pre visit review using our clinic review tool, if applicable. No additional management support is needed unless otherwise documented below in the visit note. 

## 2018-10-17 NOTE — Patient Instructions (Addendum)
Per our records you are due for an eye exam. Please contact your eye doctor to schedule an appointment. Please have them send copies of your office visit notes to [place]. Our fax number is [phone].     GO TO THE FRONT DESK Schedule your next appointment    For a check up in 6 months

## 2018-10-17 NOTE — Progress Notes (Signed)
Subjective:    Patient ID: Jonathan Leather., male    DOB: November 26, 1935, 83 y.o.   MRN: [id number]  DOS:  10/17/2018 Type of visit - description: Follow-up Weight loss: Weight is a stable today. Polycythemia: Was found to have increased hemoglobin at the last time, saw hematology, note reviewed. HTN: Good med compliance, no apparent side effects.  BP yesterday 130/75.  Wt Readings from Last 3 Encounters:  10/17/18 238 lb 4 oz (108.1 kg)  09/02/18 240 lb 3.2 oz (109 kg)  08/19/18 238 lb (108 kg)     Review of Systems No chest pain or difficulty breathing  Past Medical History:  Diagnosis Date  . CAD (coronary artery disease)    Two RCA stents remotely / 3rd RCA stent 2006  . Colon polyps    s/p several Cscopes.  Marland Kitchen COPD (chronic obstructive pulmonary disease) (HCC)    on O2, nocturnal  . Diabetes mellitus    dx aprox 2009  . ED (erectile dysfunction)    has a vacumm device  . Ejection fraction   . Hyperlipidemia    dx in 90s  . Hypertension    dx in the 90  . Hypogonadism male   . PVD (peripheral vascular disease) ([place])    s/p stents at LE 2009, Dr Einar Gip  . Shortness of breath    O2 Sat dropped to 82% walking on the treadmill, September, 2012    Past Surgical History:  Procedure Laterality Date  . APPENDECTOMY    . TONSILLECTOMY      Social History   Socioeconomic History  . Marital status: Married    Spouse name: Cerrella   . Number of children: 6  . Years of education: Not on file  . Highest education level: Not on file  Occupational History  . 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 Financial resource strain: Not on file  . Food insecurity:    Worry: Not on file    Inability: Not on file  . Transportation needs:    Medical: Not on file    Non-medical: Not on file  Tobacco Use  . Smoking status: Former Smoker    Packs/day: 2.00    Years: 30.00    Pack years: 60.00    Types: Cigarettes    Last attempt to  quit: 06/17/1977    Years since quitting: 41.3  . Smokeless tobacco: Never Used  . Tobacco comment: 2 ppd, quit 1987  Substance and Sexual Activity  . Alcohol use: Yes    Alcohol/week: 0.0 standard drinks    Comment: socially   . Drug use: No  . Sexual activity: Yes  Lifestyle  . Physical activity:    Days per week: Not on file    Minutes per session: Not on file  . Stress: Not on file  Relationships  . Social connections:    Talks on phone: Not on file    Gets together: Not on file    Attends religious service: Not on file    Active member of club or organization: Not on file    Attends meetings of clubs or organizations: Not on file    Relationship status: Not on file  . Intimate partner violence:    Fear of current or ex partner: Not on file    Emotionally abused: Not on file    Physically abused: Not on file    Forced sexual activity: Not on  file  Other Topics Concern  . Not on file  Social History Narrative   4 children (lost 1 son)   Wife has 2 children                   Allergies as of 10/17/2018   No Known Allergies     Medication List       Accurate as of October 17, 2018 11:59 PM. Always use your most recent med list.        amLODipine 10 MG tablet Commonly known as:  NORVASC TAKE 1 TABLET DAILY   aspirin 81 MG tablet Take 81 mg by mouth daily.   empagliflozin 10 MG Tabs tablet Commonly known as:  JARDIANCE Take 10 mg by mouth daily.   freestyle lancets USE AS INSTRUCTED TO CHECK BLOOD SUGAR TWICE A DAY   FREESTYLE LITE Devi 1 each by Does not apply route daily. Use as instructed to check blood sugar twice daily.  DX: E11.9   gabapentin 100 MG capsule Commonly known as:  NEURONTIN Take 1 capsule (100 mg total) by mouth 3 (three) times daily.   glucose blood test strip Commonly known as:  FREESTYLE LITE Use as instructed to check blood sugar twice daily.   losartan 100 MG tablet Commonly known as:  COZAAR Take 1 tablet (100 mg  total) by mouth daily.   metFORMIN 1000 MG tablet Commonly known as:  GLUCOPHAGE Take 1,000 mg by mouth daily.   metoprolol succinate 100 MG 24 hr tablet Commonly known as:  TOPROL XL Take 1 tablet (100 mg total) by mouth daily.   multivitamin with minerals Tabs tablet Take 1 tablet by mouth daily.   simvastatin 20 MG tablet Commonly known as:  ZOCOR Take 0.5 tablets (10 mg total) by mouth at bedtime.   triamterene-hydrochlorothiazide 37.5-25 MG tablet Commonly known as:  MAXZIDE-25 Take 0.5 tablets by mouth daily.   TRULICITY 1.5 WI/2.0BT Sopn Generic drug:  Dulaglutide INJECT 1.5 MG INTO THE SKIN WEEKLY   Zoster Vaccine Adjuvanted injection Commonly known as:  SHINGRIX Inject 0.5 mLs into the muscle once for 1 dose.           Objective:   Physical Exam BP 132/80 (BP Location: Right Arm, Patient Position: Sitting, Cuff Size: Normal)   Pulse 84   Temp 98.2 F (36.8 C) (Oral)   Resp 16   Ht 6\' 2"  (1.88 m)   Wt 238 lb 4 oz (108.1 kg)   SpO2 92%   BMI 30.59 kg/m      General:   Well developed, NAD, BMI noted. HEENT:  Normocephalic . Face symmetric, atraumatic Neck: No supraclavicular mass or unusual lymph node found today Lungs:  CTA B Normal respiratory effort, no intercostal retractions, no accessory muscle use. Heart: RRR,  no murmur.  No pretibial edema bilaterally  Skin: Not pale. Not jaundice Neurologic:  alert & oriented X3.  Speech normal, gait appropriate for age and unassisted Psych--  Cognition and judgment appear intact.  Cooperative with normal attention span and concentration.  Behavior appropriate. No anxious or depressed appearing.    Assessment    Assessment DM dx ~5974, complicated by CAD, PVD, mild neuropathy. Sees Dr. Dwyane Dee Hypogonadism. Sees Dr. Dwyane Dee HTN Hyperlipidemia COPD, nocturnal O2 prn Back pain, chronic: See visit 10/12/2016 CV: -CAD, Dr. Ron Parker Dr Meda Coffee S/p  stenting 2 to the RCA remote past and the third RCA  stent in  2006, negative stress test in 2012, LVEF 55% on echo  2015 -Peripheral vascular disease, stents lower extremity 2009  ED Polycythemia  PLAN    DM: Per endocrine. Hypogonadism: Off HRT for several months.  Managed by Endo HTN: Seems well controlled on amlodipine losartan metoprolol.  Also on Maxide.  Last BMP satisfactory. Weight loss: See last visit, weight is stable, chest x-ray, TSH normal. He did have a question of enlarged lymph at the left supraclavicular area at the last visit, exam today is negative. Polycythemia: Found at the last visit, was referred to hematology, peripheral blood smear was essentially normal.,  Factors playing a role were felt to be respiratory failure, chronic testosterone use (which was stopped at the end of 2019) .  Less likely primary blood disorder.Iron was not elevated. Preventive care: PNM 23 today.  Shingrix recommended RTC 6 months CPX

## 2018-10-18 NOTE — Assessment & Plan Note (Signed)
DM: Per endocrine. Hypogonadism: Off HRT for several months.  Managed by Endo HTN: Seems well controlled on amlodipine losartan metoprolol.  Also on Maxide.  Last BMP satisfactory. Weight loss: See last visit, weight is stable, chest x-ray, TSH normal. He did have a question of enlarged lymph at the left supraclavicular area at the last visit, exam today is negative. Polycythemia: Found at the last visit, was referred to hematology, peripheral blood smear was essentially normal.,  Factors playing a role were felt to be respiratory failure, chronic testosterone use (which was stopped at the end of 2019) .  Less likely primary blood disorder.Iron was not elevated. Preventive care: PNM 23 today.  Shingrix recommended RTC 6 months CPX

## 2018-10-28 ENCOUNTER — Ambulatory Visit: Payer: Medicare Other | Admitting: Internal Medicine

## 2018-11-18 ENCOUNTER — Inpatient Hospital Stay: Payer: Medicare Other | Attending: Hematology | Admitting: Hematology

## 2018-11-18 ENCOUNTER — Other Ambulatory Visit: Payer: Self-pay

## 2018-11-18 ENCOUNTER — Telehealth: Payer: Self-pay | Admitting: Hematology

## 2018-11-18 ENCOUNTER — Inpatient Hospital Stay: Payer: Medicare Other

## 2018-11-18 ENCOUNTER — Encounter: Payer: Self-pay | Admitting: Hematology

## 2018-11-18 VITALS — BP 131/68 | HR 84 | Temp 97.7°F | Resp 18 | Ht 72.0 in | Wt 240.8 lb

## 2018-11-18 DIAGNOSIS — J9611 Chronic respiratory failure with hypoxia: Secondary | ICD-10-CM | POA: Diagnosis not present

## 2018-11-18 DIAGNOSIS — D751 Secondary polycythemia: Secondary | ICD-10-CM

## 2018-11-18 DIAGNOSIS — Z7982 Long term (current) use of aspirin: Secondary | ICD-10-CM | POA: Insufficient documentation

## 2018-11-18 DIAGNOSIS — E291 Testicular hypofunction: Secondary | ICD-10-CM | POA: Diagnosis not present

## 2018-11-18 DIAGNOSIS — Z79899 Other long term (current) drug therapy: Secondary | ICD-10-CM | POA: Diagnosis not present

## 2018-11-18 LAB — CBC WITH DIFFERENTIAL (CANCER CENTER ONLY)
Abs Immature Granulocytes: 0.02 10*3/uL (ref 0.00–0.07)
Basophils Absolute: 0.1 10*3/uL (ref 0.0–0.1)
Basophils Relative: 1 %
Eosinophils Absolute: 0.2 10*3/uL (ref 0.0–0.5)
Eosinophils Relative: 2 %
HCT: 53.2 % — ABNORMAL HIGH (ref 39.0–52.0)
HEMOGLOBIN: 17.9 g/dL — AB (ref 13.0–17.0)
Immature Granulocytes: 0 %
LYMPHS PCT: 38 %
Lymphs Abs: 3.2 10*3/uL (ref 0.7–4.0)
MCH: 33.2 pg (ref 26.0–34.0)
MCHC: 33.6 g/dL (ref 30.0–36.0)
MCV: 98.7 fL (ref 80.0–100.0)
Monocytes Absolute: 0.9 10*3/uL (ref 0.1–1.0)
Monocytes Relative: 11 %
Neutro Abs: 4.1 10*3/uL (ref 1.7–7.7)
Neutrophils Relative %: 48 %
Platelet Count: 181 10*3/uL (ref 150–400)
RBC: 5.39 MIL/uL (ref 4.22–5.81)
RDW: 13.7 % (ref 11.5–15.5)
WBC: 8.4 10*3/uL (ref 4.0–10.5)
nRBC: 0 % (ref 0.0–0.2)

## 2018-11-18 LAB — CMP (CANCER CENTER ONLY)
ALT: 16 U/L (ref 0–44)
ANION GAP: 11 (ref 5–15)
AST: 14 U/L — ABNORMAL LOW (ref 15–41)
Albumin: 4.5 g/dL (ref 3.5–5.0)
Alkaline Phosphatase: 65 U/L (ref 38–126)
BUN: 20 mg/dL (ref 8–23)
CHLORIDE: 104 mmol/L (ref 98–111)
CO2: 26 mmol/L (ref 22–32)
Calcium: 9.9 mg/dL (ref 8.9–10.3)
Creatinine: 1.42 mg/dL — ABNORMAL HIGH (ref 0.61–1.24)
GFR, Est AFR Am: 54 mL/min — ABNORMAL LOW (ref >60)
GFR, Estimated: 46 mL/min — ABNORMAL LOW (ref >60)
Glucose, Bld: 163 mg/dL — ABNORMAL HIGH (ref 70–99)
POTASSIUM: 3.9 mmol/L (ref 3.5–5.1)
Sodium: 141 mmol/L (ref 135–145)
Total Bilirubin: 0.9 mg/dL (ref 0.3–1.2)
Total Protein: 7.7 g/dL (ref 6.5–8.1)

## 2018-11-18 NOTE — Progress Notes (Signed)
[facility] OFFICE PROGRESS NOTE  Patient Care Team: Colon Branch, MD as PCP - General Dorothy Spark, MD as PCP - Cardiology (Cardiology) Elayne Snare, MD as Consulting Physician (Endocrinology) Deneise Lever, MD as Consulting Physician (Pulmonary Disease) Clent Jacks, MD as Consulting Physician (Ophthalmology) Camelia Phenes, DPM (Inactive) as Consulting Physician (Podiatry) Dorothy Spark, MD as Consulting Physician (Cardiology) Carolan Clines, MD (Inactive) as Consulting Physician (Urology)  HEME/ONC OVERVIEW: 1. Chronic polycythemia -Hgb between 17 and 18 dating back to 2012   -Likely due to COPD and exogenous testosterone use  -Epo level 12.3 (c/w secondary polycythemia); iron profile normal   PERTINENT NON-HEM/ONC PROBLEMS 1. Chronic hypoxic respiratory failure on supplement O2 due to COPD  2. Hx of hypogonadism on testosterone cream for many years, stopped in late 2019   ASSESSMENT & PLAN:   Chronic polycythemia -Most likely secondary to chronic hypoxia from underlying lung disease  -Iron profile normal; EPO level 12.3, arguing against polycythemia vera, as epo level is usually very low in PV -Hgb 17.9 today, stable -In the absence of concurrent leukocytosis or thrombocytosis, myeloproliferative disorder is less likely, and there is no indication for MPN NGS at this time -No indication for phlebotomy in the setting of secondary polycythemia, as phlebotomy can exacerbate underlying conditions, such as chronic respiratory failure  Chronic hypoxic respiratory failure -FTC 07/2018 showed severe diffusion restriction minimal obstruction -On supplemental O2 at night and PRN with activity -Continue follow-up with pulmonary medicine at [facility] and [facility]   History of hypogonadism -Patient stopped using testosterone cream in 08/2018 -He was counseled on avoiding exogenous steroid use, including topical cream, as it can lead to polycythemia    Orders Placed This Encounter  Procedures  . CBC with Differential (Cancer Center Only)    Standing Status:   Future    Standing Expiration Date:   12/23/2019  . CMP ([facility] only)    Standing Status:   Future    Standing Expiration Date:   12/23/2019  . Lactate dehydrogenase    Standing Status:   Future    Standing Expiration Date:   12/23/2019   All questions were answered. The patient knows to call the clinic with any problems, questions or concerns. No barriers to learning was detected.  A total of more than 25 minutes were spent face-to-face with the patient during this encounter and over half of that time was spent on counseling and coordination of care as outlined above.   Return in 6 months for labs and clinic follow-up.  Tish Men, MD 11/18/2018 11:36 AM  CHIEF COMPLAINT: "I am still active so far "  INTERVAL HISTORY: Mr. Jonathan [name] returns to clinic for follow-up of polycythemia.  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  He still wears supplemental O2 at night (2 L/min) as well as needed with activities.  Overall, his breathing has been stable.  He otherwise denies any fever, chill, night sweats, lymphadenopathy, chest pain, abdominal pain, nausea, vomiting, diarrhea, or abnormal bleeding/bruising.  REVIEW OF SYSTEMS:   Constitutional: ( - ) fevers, ( - )  chills , ( - ) night sweats Eyes: ( - ) blurriness of vision, ( - ) double vision, ( - ) watery eyes Ears, nose, mouth, throat, and face: ( - ) mucositis, ( - ) sore throat Respiratory: ( - ) cough, ( + ) dyspnea, ( - ) wheezes Cardiovascular: ( - )  palpitation, ( - ) chest discomfort, ( - ) lower extremity swelling Gastrointestinal:  ( - ) nausea, ( - ) heartburn, ( - ) change in bowel habits Skin: ( - ) abnormal skin rashes Lymphatics: ( - ) new lymphadenopathy, ( - ) easy bruising Neurological: ( - ) numbness, ( - ) tingling, ( - ) new  weaknesses Behavioral/Psych: ( - ) mood change, ( - ) new changes  All other systems were reviewed with the patient and are negative.  I have reviewed the past medical history, past surgical history, social history and family history with the patient and they are unchanged from previous note.  ALLERGIES:  has No Known Allergies.  MEDICATIONS:  Current Outpatient Medications  Medication Sig Dispense Refill  . amLODipine (NORVASC) 10 MG tablet TAKE 1 TABLET DAILY 90 tablet 3  . aspirin 81 MG tablet Take 81 mg by mouth daily.      . Blood Glucose Monitoring Suppl (FREESTYLE LITE) DEVI 1 each by Does not apply route daily. Use as instructed to check blood sugar twice daily.  DX: E11.9 1 each each  . empagliflozin (JARDIANCE) 10 MG TABS tablet Take 10 mg by mouth daily. 90 tablet 3  . gabapentin (NEURONTIN) 100 MG capsule Take 1 capsule (100 mg total) by mouth 3 (three) times daily. 90 capsule 1  . glucose blood (FREESTYLE LITE) test strip Use as instructed to check blood sugar twice daily. 100 each 12  . Lancets (FREESTYLE) lancets USE AS INSTRUCTED TO CHECK BLOOD SUGAR TWICE A DAY 200 each 1  . losartan (COZAAR) 100 MG tablet Take 1 tablet (100 mg total) by mouth daily. 90 tablet 1  . metFORMIN (GLUCOPHAGE) 1000 MG tablet Take 1,000 mg by mouth daily.    . metoprolol succinate (TOPROL XL) 100 MG 24 hr tablet Take 1 tablet (100 mg total) by mouth daily. 90 tablet 2  . Multiple Vitamin (MULTIVITAMIN WITH MINERALS) TABS tablet Take 1 tablet by mouth daily.    . simvastatin (ZOCOR) 20 MG tablet Take 0.5 tablets (10 mg total) by mouth at bedtime.    . triamterene-hydrochlorothiazide (MAXZIDE-25) 37.5-25 MG tablet Take 0.5 tablets by mouth daily.    . TRULICITY 1.5 JY/7.8GN SOPN INJECT 1.5 MG INTO THE SKIN WEEKLY 6 mL 4   No current facility-administered medications for this visit.     PHYSICAL EXAMINATION: ECOG PERFORMANCE STATUS: 1 - Symptomatic but completely ambulatory  Today's Vitals    11/18/18 1033 11/18/18 1034  BP: 131/68   Pulse: 84   Resp: 18   Temp: 97.7 F (36.5 C)   TempSrc: Oral   SpO2: 95%   Weight: 240 lb 12.8 oz (109.2 kg)   Height: 6' (1.829 m)   PainSc: 0-No pain 0-No pain   Body mass index is 32.66 kg/m.  Filed Weights   11/18/18 1033  Weight: 240 lb 12.8 oz (109.2 kg)    GENERAL: alert, no distress and comfortable SKIN: skin color, texture, turgor are normal, no rashes or significant lesions EYES: conjunctiva are pink and non-injected, sclera clear OROPHARYNX: no exudate, no erythema; lips, buccal mucosa, and tongue normal  NECK: supple, non-tender LUNGS: clear to auscultation with normal breathing effort HEART: regular rate & rhythm and no murmurs and no lower extremity edema ABDOMEN: soft, non-tender, non-distended, normal bowel sounds Musculoskeletal: no cyanosis of digits and no clubbing  PSYCH: alert & oriented x 3, fluent speech NEURO: no focal motor/sensory deficits  LABORATORY DATA:  I have reviewed the data  as listed    Component Value Date/Time   NA 141 11/18/2018 1011   K 3.9 11/18/2018 1011   CL 104 11/18/2018 1011   CO2 26 11/18/2018 1011   GLUCOSE 163 (H) 11/18/2018 1011   BUN 20 11/18/2018 1011   CREATININE 1.42 (H) 11/18/2018 1011   CALCIUM 9.9 11/18/2018 1011   PROT 7.7 11/18/2018 1011   ALBUMIN 4.5 11/18/2018 1011   AST 14 (L) 11/18/2018 1011   ALT 16 11/18/2018 1011   ALKPHOS 65 11/18/2018 1011   BILITOT 0.9 11/18/2018 1011   GFRNONAA 46 (L) 11/18/2018 1011   GFRAA 54 (L) 11/18/2018 1011    No results found for: SPEP, UPEP  Lab Results  Component Value Date   WBC 8.4 11/18/2018   NEUTROABS 4.1 11/18/2018   HGB 17.9 (H) 11/18/2018   HCT 53.2 (H) 11/18/2018   MCV 98.7 11/18/2018   PLT 181 11/18/2018      Chemistry      Component Value Date/Time   NA 141 11/18/2018 1011   K 3.9 11/18/2018 1011   CL 104 11/18/2018 1011   CO2 26 11/18/2018 1011   BUN 20 11/18/2018 1011   CREATININE 1.42 (H)  11/18/2018 1011      Component Value Date/Time   CALCIUM 9.9 11/18/2018 1011   ALKPHOS 65 11/18/2018 1011   AST 14 (L) 11/18/2018 1011   ALT 16 11/18/2018 1011   BILITOT 0.9 11/18/2018 1011

## 2018-11-18 NOTE — Telephone Encounter (Signed)
Appointments scheduled AVS/Calendar printed per 3/3 los

## 2018-12-03 ENCOUNTER — Ambulatory Visit: Payer: Medicare Other | Admitting: Internal Medicine

## 2018-12-03 ENCOUNTER — Ambulatory Visit (INDEPENDENT_AMBULATORY_CARE_PROVIDER_SITE_OTHER): Payer: Medicare Other | Admitting: Internal Medicine

## 2018-12-03 ENCOUNTER — Encounter: Payer: Self-pay | Admitting: Family Medicine

## 2018-12-03 ENCOUNTER — Other Ambulatory Visit: Payer: Self-pay

## 2018-12-03 VITALS — BP 126/80 | HR 69 | Temp 97.6°F | Resp 16 | Ht 72.0 in | Wt 239.4 lb

## 2018-12-03 DIAGNOSIS — N411 Chronic prostatitis: Secondary | ICD-10-CM | POA: Diagnosis not present

## 2018-12-03 DIAGNOSIS — R399 Unspecified symptoms and signs involving the genitourinary system: Secondary | ICD-10-CM | POA: Diagnosis not present

## 2018-12-03 DIAGNOSIS — M79605 Pain in left leg: Secondary | ICD-10-CM

## 2018-12-03 DIAGNOSIS — M79604 Pain in right leg: Secondary | ICD-10-CM

## 2018-12-03 LAB — URINALYSIS, ROUTINE W REFLEX MICROSCOPIC
BILIRUBIN URINE: NEGATIVE
HGB URINE DIPSTICK: NEGATIVE
Ketones, ur: NEGATIVE
LEUKOCYTE UA: NEGATIVE
NITRITE: NEGATIVE
RBC / HPF: NONE SEEN (ref 0–?)
Specific Gravity, Urine: 1.025 (ref 1.000–1.030)
Total Protein, Urine: NEGATIVE
Urine Glucose: >=1000 — AB
Urobilinogen, UA: 0.2 (ref 0.0–1.0)
pH: 5.5 (ref 5.0–8.0)

## 2018-12-03 MED ORDER — CYCLOBENZAPRINE HCL 10 MG PO TABS: 10.0000 mg | ORAL_TABLET | Freq: Every evening | ORAL | 0 refills | Status: DC | PRN

## 2018-12-03 NOTE — Progress Notes (Signed)
Subjective:    Patient ID: Jonathan Leather., male    DOB: 03-17-1936, 83 y.o.   MRN: [id number]  DOS:  12/03/2018 Type of visit - description: acute Symptoms are started 2 weeks ago and they happened twice. He was wake up by moderate to severe pain on the inner thigh bilaterally, near the groins. He got up from bed, walked from his bedroom to the bathroom and that seemed to help the pain. He reports that the area felt hard (I wonder if he is describing a cramp) Denies any back pain. No classic claudication, denies calf pain with walking. No testicular or scrotal swelling or pain.  Also, having L UTS for the last 4 to 5 months, some urinary urgency, flow is a slow and "stop and go". Denies gross hematuria, no history of previous UTIs.   Review of Systems See above  Past Medical History:  Diagnosis Date  . CAD (coronary artery disease)    Two RCA stents remotely / 3rd RCA stent 2006  . Colon polyps    s/p several Cscopes.  Marland Kitchen COPD (chronic obstructive pulmonary disease) (HCC)    on O2, nocturnal  . Diabetes mellitus    dx aprox 2009  . ED (erectile dysfunction)    has a vacumm device  . Ejection fraction   . Hyperlipidemia    dx in 90s  . Hypertension    dx in the 90  . Hypogonadism male   . PVD (peripheral vascular disease) ([place])    s/p stents at LE 2009, Dr Einar Gip  . Shortness of breath    O2 Sat dropped to 82% walking on the treadmill, September, 2012    Past Surgical History:  Procedure Laterality Date  . APPENDECTOMY    . TONSILLECTOMY      Social History   Socioeconomic History  . Marital status: Married    Spouse name: Cerrella   . Number of children: 6  . Years of education: Not on file  . Highest education level: Not on file  Occupational History  . 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 Financial resource strain: Not on file  . Food insecurity:    Worry: Not on file    Inability: Not on file  .  Transportation needs:    Medical: Not on file    Non-medical: Not on file  Tobacco Use  . Smoking status: Former Smoker    Packs/day: 2.00    Years: 30.00    Pack years: 60.00    Types: Cigarettes    Last attempt to quit: 06/17/1977    Years since quitting: 41.4  . Smokeless tobacco: Never Used  . Tobacco comment: 2 ppd, quit 1987  Substance and Sexual Activity  . Alcohol use: Yes    Alcohol/week: 0.0 standard drinks    Comment: socially   . Drug use: No  . Sexual activity: Yes  Lifestyle  . Physical activity:    Days per week: Not on file    Minutes per session: Not on file  . Stress: Not on file  Relationships  . Social connections:    Talks on phone: Not on file    Gets together: Not on file    Attends religious service: Not on file    Active member of club or organization: Not on file    Attends meetings of clubs or organizations: Not on file    Relationship status: Not on  file  . Intimate partner violence:    Fear of current or ex partner: Not on file    Emotionally abused: Not on file    Physically abused: Not on file    Forced sexual activity: Not on file  Other Topics Concern  . Not on file  Social History Narrative   4 children (lost 1 son)   Wife has 2 children                   Allergies as of 12/03/2018   No Known Allergies     Medication List       Accurate as of December 03, 2018 11:59 PM. Always use your most recent med list.        amLODipine 10 MG tablet Commonly known as:  NORVASC TAKE 1 TABLET DAILY   aspirin 81 MG tablet Take 81 mg by mouth daily.   cyclobenzaprine 10 MG tablet Commonly known as:  FLEXERIL Take 1 tablet (10 mg total) by mouth at bedtime as needed for muscle spasms.   empagliflozin 10 MG Tabs tablet Commonly known as:  Jardiance Take 10 mg by mouth daily.   freestyle lancets USE AS INSTRUCTED TO CHECK BLOOD SUGAR TWICE A DAY   FreeStyle Lite Devi 1 each by Does not apply route daily. Use as instructed to  check blood sugar twice daily.  DX: E11.9   gabapentin 100 MG capsule Commonly known as:  NEURONTIN Take 1 capsule (100 mg total) by mouth 3 (three) times daily.   glucose blood test strip Commonly known as:  FREESTYLE LITE Use as instructed to check blood sugar twice daily.   losartan 100 MG tablet Commonly known as:  COZAAR Take 1 tablet (100 mg total) by mouth daily.   metFORMIN 1000 MG tablet Commonly known as:  GLUCOPHAGE Take 1,000 mg by mouth daily.   metoprolol succinate 100 MG 24 hr tablet Commonly known as:  Toprol XL Take 1 tablet (100 mg total) by mouth daily.   multivitamin with minerals Tabs tablet Take 1 tablet by mouth daily.   simvastatin 20 MG tablet Commonly known as:  ZOCOR Take 0.5 tablets (10 mg total) by mouth at bedtime.   triamterene-hydrochlorothiazide 37.5-25 MG tablet Commonly known as:  MAXZIDE-25 Take 0.5 tablets by mouth daily.   Trulicity 1.5 TD/3.2KG Sopn Generic drug:  Dulaglutide INJECT 1.5 MG INTO THE SKIN WEEKLY           Objective:   Physical Exam BP 126/80 (BP Location: Left Arm, Patient Position: Sitting, Cuff Size: Normal)   Pulse 69   Temp 97.6 F (36.4 C) (Oral)   Resp 16   Ht 6' (1.829 m)   Wt 239 lb 6 oz (108.6 kg)   SpO2 95%   BMI 32.47 kg/m  General:   Well developed, NAD, BMI noted. HEENT:  Normocephalic . Face symmetric, atraumatic Lungs:  CTA B Normal respiratory effort, no intercostal retractions, no accessory muscle use. Heart: RRR,  no murmur.  No pretibial edema bilaterally Vascular exam: Normal femoral and pedal pulses.  No edema. MSK: No TTP at the trochanteric bursa's or innerer thighs. GU: Scrotal contents normal.  Penis normal, uncircumcised. DRE: Very difficult to reach the prostate but seems normal. Skin: Not pale. Not jaundice Neurologic:  alert & oriented X3.  Speech normal, gait appropriate for age and unassisted Psych--  Cognition and judgment appear intact.  Cooperative with  normal attention span and concentration.  Behavior appropriate. No anxious or depressed  appearing.      Assessment     Assessment DM dx ~3151, complicated by CAD, PVD, mild neuropathy. Sees Dr. Dwyane Dee Hypogonadism. Sees Dr. Dwyane Dee HTN Hyperlipidemia COPD, nocturnal O2 prn Back pain, chronic: See visit 10/12/2016 CV: -CAD, Dr. Ron Parker Dr Meda Coffee S/p  stenting 2 to the RCA remote past and the third RCA stent in  2006, negative stress test in 2012, LVEF 55% on echo 2015 -Peripheral vascular disease, stents lower extremity 2009  ED Polycythemia: Saw hematology 11-2018, felt to be due to chronic respiratory failure.  Not likely polycythemia vera  PLAN    Bilateral leg pain: New problem.  As described above, I really doubt disease vascular in nature.  Cramps?  Recommend Flexeril at night, call if no better. LUTS: New to the patient, typically he does not have these problems.  Back in 01/2017, DRE was normal and PSA was 0.6.  Today DRE is normal again.  We will check a PSA (r/o prostatitis) UA, urine culture.  Medication discussed, declined for now .

## 2018-12-03 NOTE — Progress Notes (Signed)
Pre visit review using our clinic review tool, if applicable. No additional management support is needed unless otherwise documented below in the visit note. 

## 2018-12-03 NOTE — Patient Instructions (Signed)
GO TO THE LAB : Get the blood work      Take Flexeril at bedtime, this is a muscle relaxant, will cause drowsiness

## 2018-12-04 LAB — URINE CULTURE
MICRO NUMBER:: 332659
SPECIMEN QUALITY:: ADEQUATE

## 2018-12-04 NOTE — Assessment & Plan Note (Signed)
Bilateral leg pain: New problem.  As described above, I really doubt disease vascular in nature.  Cramps?  Recommend Flexeril at night, call if no better. LUTS: New to the patient, typically he does not have these problems.  Back in 01/2017, DRE was normal and PSA was 0.6.  Today DRE is normal again.  We will check a PSA (r/o prostatitis) UA, urine culture.  Medication discussed, declined for now .

## 2018-12-05 LAB — PSA: PSA: 0.53 ng/mL (ref 0.10–4.00)

## 2018-12-05 NOTE — Addendum Note (Signed)
Addended by: Caffie Pinto on: 12/05/2018 04:36 PM   Modules accepted: Orders

## 2018-12-07 IMAGING — DX DG CHEST 2V
2 series · 2 of 2 positions shown · non-contrast
Comparison: 02/15/2017

CLINICAL DATA: Unintentional weight loss

EXAM:
CHEST - 2 VIEW

[chest pa]
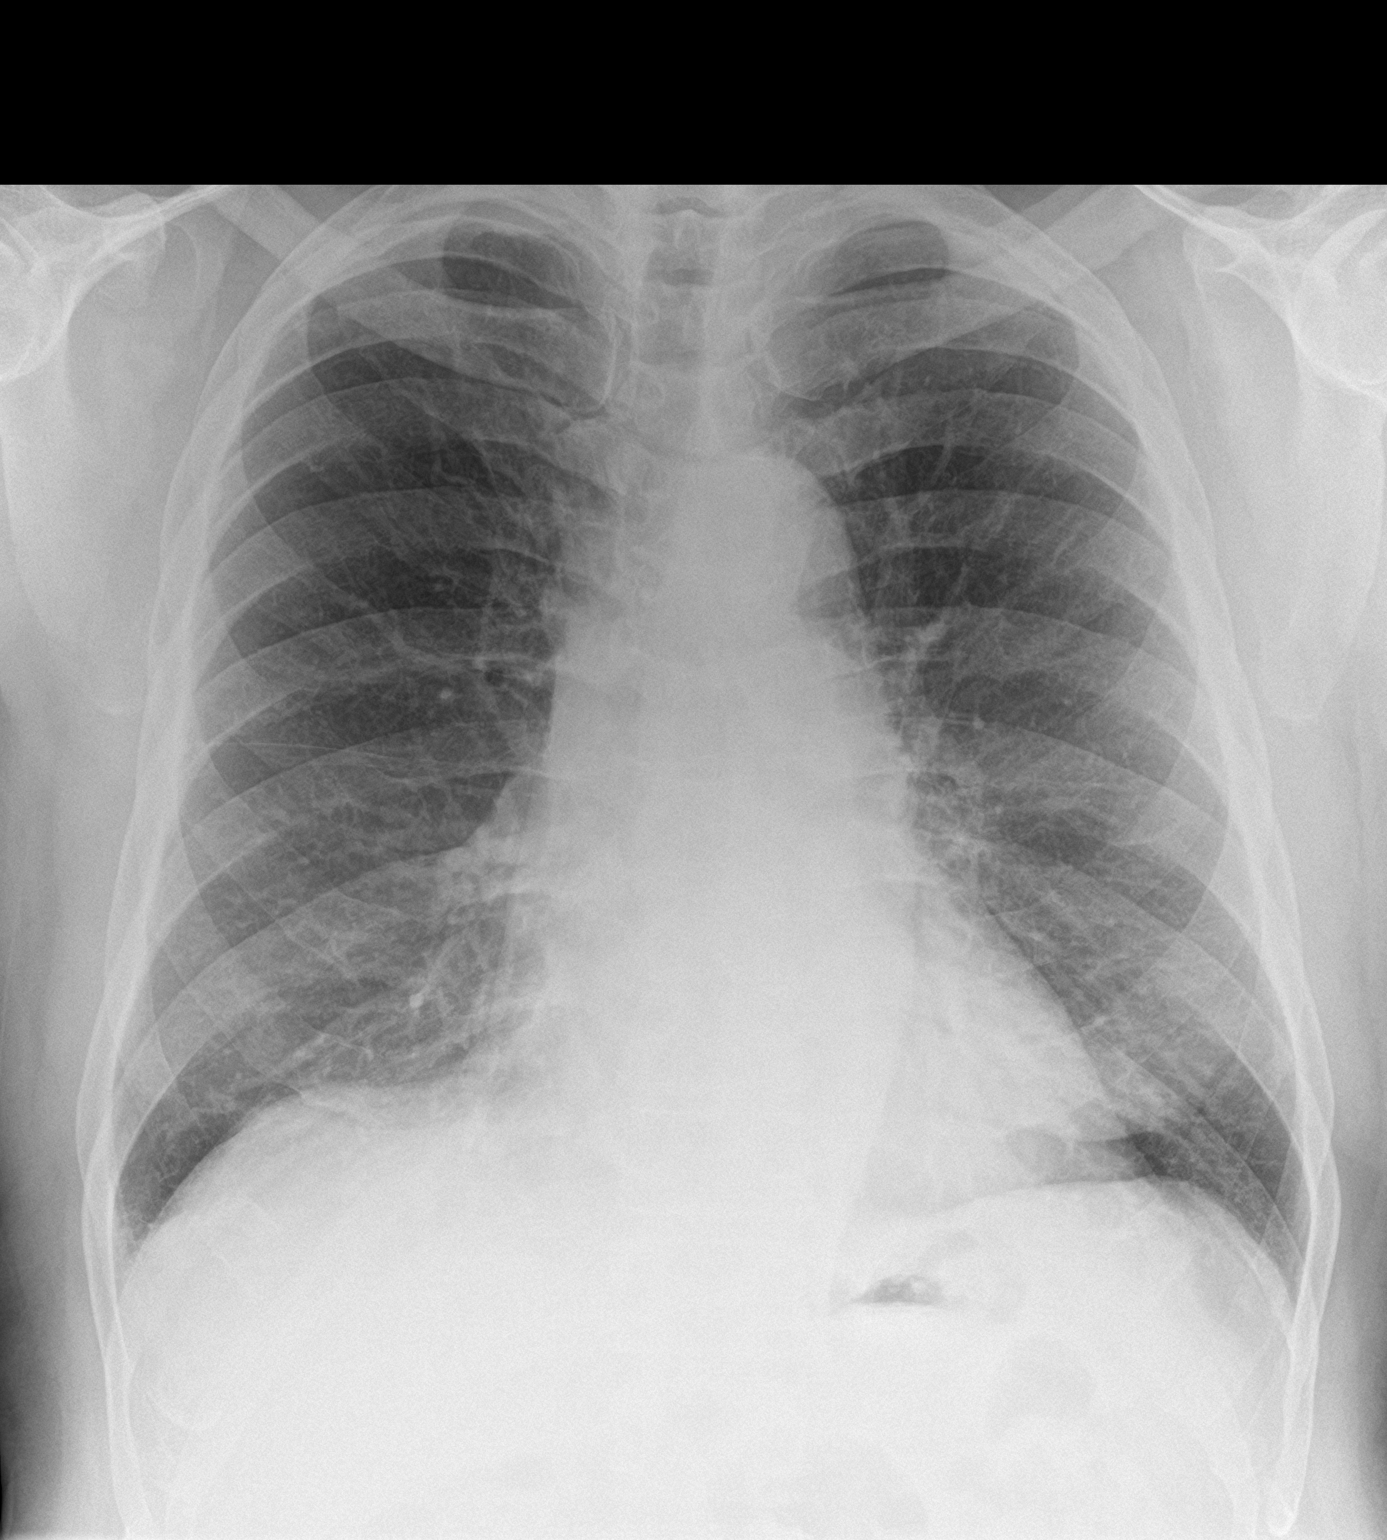

[chest lat]
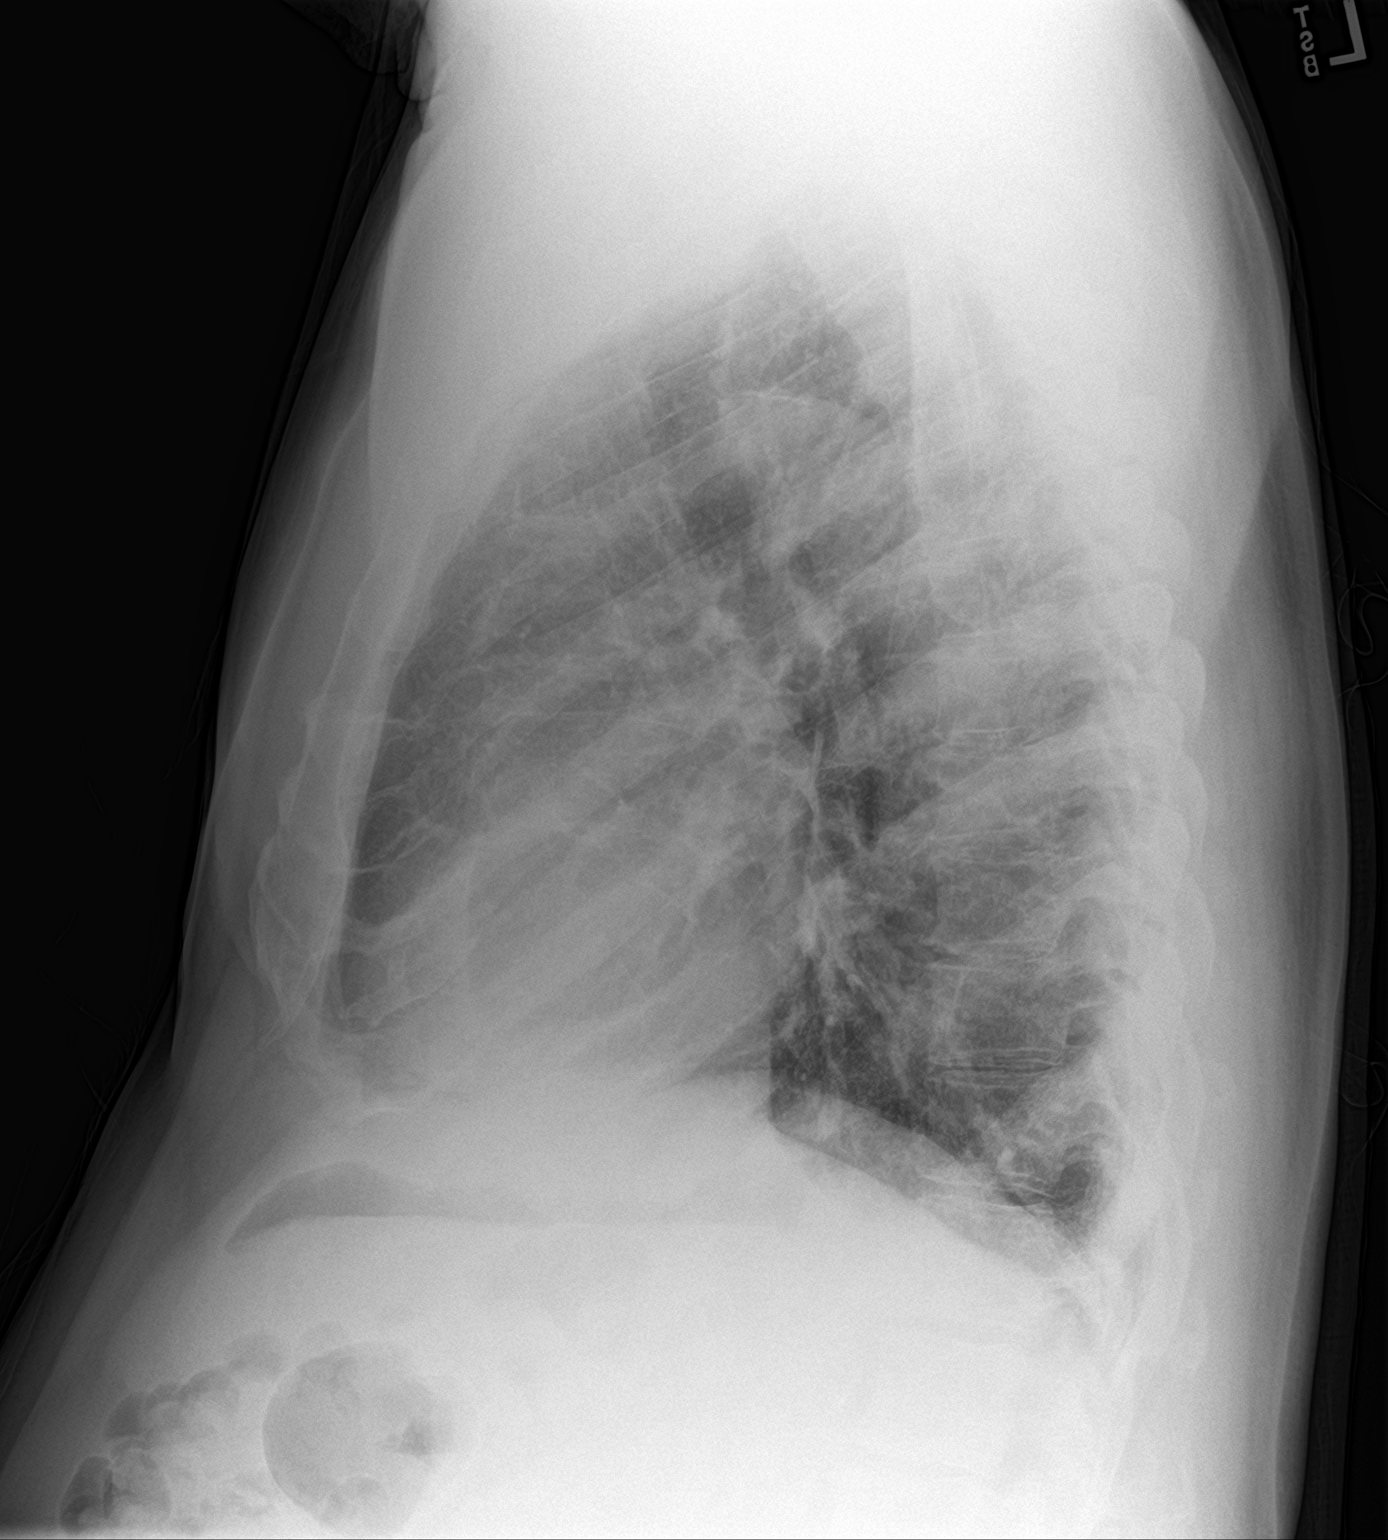

[2 of 2 positions shown; findings below may reference images not displayed]

FINDINGS: Interstitial coarsening at the bases that was also seen previously.
There is no consolidation, effusion, or pneumothorax. Normal heart
size and mediastinal contours. No osseous findings
IMPRESSION: No evidence of active disease.  Stable compared to February 2017 exam.

## 2018-12-30 ENCOUNTER — Other Ambulatory Visit: Payer: Self-pay

## 2018-12-30 ENCOUNTER — Other Ambulatory Visit (INDEPENDENT_AMBULATORY_CARE_PROVIDER_SITE_OTHER): Payer: Medicare Other

## 2018-12-30 DIAGNOSIS — E1142 Type 2 diabetes mellitus with diabetic polyneuropathy: Secondary | ICD-10-CM | POA: Diagnosis not present

## 2018-12-30 LAB — COMPREHENSIVE METABOLIC PANEL
ALT: 23 U/L (ref 0–53)
AST: 20 U/L (ref 0–37)
Albumin: 4.1 g/dL (ref 3.5–5.2)
Alkaline Phosphatase: 65 U/L (ref 39–117)
BUN: 18 mg/dL (ref 6–23)
CO2: 24 mEq/L (ref 19–32)
Calcium: 9.7 mg/dL (ref 8.4–10.5)
Chloride: 105 mEq/L (ref 96–112)
Creatinine, Ser: 1.49 mg/dL (ref 0.40–1.50)
GFR: 54.79 mL/min — ABNORMAL LOW (ref >60.00)
Glucose, Bld: 118 mg/dL — ABNORMAL HIGH (ref 70–99)
Potassium: 4 mEq/L (ref 3.5–5.1)
Sodium: 140 mEq/L (ref 135–145)
Total Bilirubin: 0.6 mg/dL (ref 0.2–1.2)
Total Protein: 7.3 g/dL (ref 6.0–8.3)

## 2018-12-30 LAB — MICROALBUMIN / CREATININE URINE RATIO
Creatinine,U: 208.6 mg/dL
Microalb Creat Ratio: 1.1 mg/g (ref 0.0–30.0)
Microalb, Ur: 2.4 mg/dL — ABNORMAL HIGH (ref 0.0–1.9)

## 2018-12-30 LAB — HEMOGLOBIN A1C: Hgb A1c MFr Bld: 7.2 % — ABNORMAL HIGH (ref 4.6–6.5)

## 2019-01-02 ENCOUNTER — Encounter: Payer: Self-pay | Admitting: Endocrinology

## 2019-01-02 ENCOUNTER — Ambulatory Visit (INDEPENDENT_AMBULATORY_CARE_PROVIDER_SITE_OTHER): Payer: Medicare Other | Admitting: Endocrinology

## 2019-01-02 ENCOUNTER — Other Ambulatory Visit: Payer: Self-pay

## 2019-01-02 VITALS — BP 122/76 | HR 76 | Ht 72.0 in | Wt 237.0 lb

## 2019-01-02 DIAGNOSIS — I10 Essential (primary) hypertension: Secondary | ICD-10-CM

## 2019-01-02 DIAGNOSIS — E1142 Type 2 diabetes mellitus with diabetic polyneuropathy: Secondary | ICD-10-CM | POA: Diagnosis not present

## 2019-01-02 DIAGNOSIS — E1165 Type 2 diabetes mellitus with hyperglycemia: Secondary | ICD-10-CM | POA: Diagnosis not present

## 2019-01-02 DIAGNOSIS — N182 Chronic kidney disease, stage 2 (mild): Secondary | ICD-10-CM | POA: Diagnosis not present

## 2019-01-02 NOTE — Progress Notes (Signed)
Patient ID: Jonathan Leather., male   DOB: November 09, 1935, 83 y.o.   MRN: [id number]    Reason for Appointment:  Follow-up  History of Present Illness:          Diagnosis: Type 2 diabetes mellitus, date of diagnosis:  2009      Past history: He is not clear what his diabetes was diagnosed but according to hospital records it may have been in 2009. Most likely he was placed on metformin initially and at some point Amaryl added. In 2013 it was also given Januvia presumably to improve his control. However appears that his A1c has been consistently over 7% Janumet was started instead of metformin and Januvia separately in 2013  He was started on Trulicity in 5/63 instead of Victoza because excessive bruising on his abdomen  Later in 01/2015 he was switched to Bydureon because of insurance noncoverage of his Trulicity  Recent history:   Non-insulin hypoglycemic drugs are: Metformin 1000 twice a day, Trulicity 1.5 mg weekly, Jardiance 10 mg daily  His A1c is higher than usual at 7.2, previously 6.2  Current blood sugar patterns and problems:  He has overall higher blood sugars at home but not taking enough and does not appear to have any consistently high readings  Most likely has some tendency to higher postprandial readings which he does not monitor much  Previously was also on Amaryl which was stopped because of mild hypoglycemia  Although he has lost a couple pounds he has not been able to find the time or energy level to exercise because of doing a lot of housework and taking care of his invalid wife  Fasting readings are mostly good also  He is tolerating Jardiance and Trulicity without side effects  His mealtimes are variable and may not be planning meals well consistently     Side effects from medications have been: None  Glucose monitoring:  done  Once a day       Glucometer:  FreeStyle Blood Glucose readings  by download    PRE-MEAL Fasting Lunch Dinner Bedtime  Overall  Glucose range:  110-157  100     Mean/median:  128     132   POST-MEAL PC Breakfast PC Lunch PC Dinner  Glucose range:  164  106  120-155  Mean/median:    132   Previous readings  PRE-MEAL Fasting Lunch Dinner Bedtime Overall  Glucose range:  108-129  102-141  100, 125  140  100-145  Mean/median:  125   115  120   The self-care:      Meals: 3 meals per day. eating egg/meat bread or Boost for breakfast, dinner 6-7 PM    Exercise:  No formal exercise, previously was getting on exercise equipment at home for up to 60 minutes    Dietician visit: Most recent: 4/16 .               Weight history:  Wt Readings from Last 3 Encounters:  01/02/19 237 lb (107.5 kg)  12/03/18 239 lb 6 oz (108.6 kg)  11/18/18 240 lb 12.8 oz (109.2 kg)   Glycemic control:   Lab Results  Component Value Date   HGBA1C 7.2 (H) 12/30/2018   HGBA1C 6.2 (A) 09/02/2018   HGBA1C 6.9 (H) 06/12/2018   Lab Results  Component Value Date   MICROALBUR 2.4 (H) 12/30/2018   LDLCALC 58 06/12/2018   CREATININE 1.49 12/30/2018    Lab Results  Component Value Date  FRUCTOSAMINE 284 03/29/2017   FRUCTOSAMINE 268 12/06/2016   FRUCTOSAMINE 282 02/22/2014    OTHER active problems addressed today: See review of systems  Lab on 12/30/2018  Component Date Value Ref Range Status  . Microalb, Ur 12/30/2018 2.4* 0.0 - 1.9 mg/dL Final  . Creatinine,U 12/30/2018 208.6  mg/dL Final  . Microalb Creat Ratio 12/30/2018 1.1  0.0 - 30.0 mg/g Final  . Sodium 12/30/2018 140  135 - 145 mEq/L Final  . Potassium 12/30/2018 4.0  3.5 - 5.1 mEq/L Final  . Chloride 12/30/2018 105  96 - 112 mEq/L Final  . CO2 12/30/2018 24  19 - 32 mEq/L Final  . Glucose, Bld 12/30/2018 118* 70 - 99 mg/dL Final  . BUN 12/30/2018 18  6 - 23 mg/dL Final  . Creatinine, Ser 12/30/2018 1.49  0.40 - 1.50 mg/dL Final  . Total Bilirubin 12/30/2018 0.6  0.2 - 1.2 mg/dL Final  . Alkaline Phosphatase 12/30/2018 65  39 - 117 U/L Final  . AST  12/30/2018 20  0 - 37 U/L Final  . ALT 12/30/2018 23  0 - 53 U/L Final  . Total Protein 12/30/2018 7.3  6.0 - 8.3 g/dL Final  . Albumin 12/30/2018 4.1  3.5 - 5.2 g/dL Final  . Calcium 12/30/2018 9.7  8.4 - 10.5 mg/dL Final  . GFR 12/30/2018 54.79* >60.00 mL/min Final  . Hgb A1c MFr Bld 12/30/2018 7.2* 4.6 - 6.5 % Final   Glycemic Control Guidelines for People with Diabetes:Non Diabetic:  <6%Goal of Therapy: <7%Additional Action Suggested:  >8%      Allergies as of 01/02/2019   No Known Allergies     Medication List       Accurate as of January 02, 2019  8:55 AM. Always use your most recent med list.        amLODipine 10 MG tablet Commonly known as:  NORVASC TAKE 1 TABLET DAILY   aspirin 81 MG tablet Take 81 mg by mouth daily.   cyclobenzaprine 10 MG tablet Commonly known as:  FLEXERIL Take 1 tablet (10 mg total) by mouth at bedtime as needed for muscle spasms.   empagliflozin 10 MG Tabs tablet Commonly known as:  Jardiance Take 10 mg by mouth daily.   freestyle lancets USE AS INSTRUCTED TO CHECK BLOOD SUGAR TWICE A DAY   FreeStyle Lite Devi 1 each by Does not apply route daily. Use as instructed to check blood sugar twice daily.  DX: E11.9   gabapentin 100 MG capsule Commonly known as:  NEURONTIN Take 1 capsule (100 mg total) by mouth 3 (three) times daily.   glucose blood test strip Commonly known as:  FREESTYLE LITE Use as instructed to check blood sugar twice daily.   losartan 100 MG tablet Commonly known as:  COZAAR Take 1 tablet (100 mg total) by mouth daily.   metFORMIN 1000 MG tablet Commonly known as:  GLUCOPHAGE Take 1,000 mg by mouth daily.   metoprolol succinate 100 MG 24 hr tablet Commonly known as:  Toprol XL Take 1 tablet (100 mg total) by mouth daily.   multivitamin with minerals Tabs tablet Take 1 tablet by mouth daily.   simvastatin 20 MG tablet Commonly known as:  ZOCOR Take 0.5 tablets (10 mg total) by mouth at bedtime.    triamterene-hydrochlorothiazide 37.5-25 MG tablet Commonly known as:  MAXZIDE-25 Take 0.5 tablets by mouth daily.   Trulicity 1.5 PP/5.0DT Sopn Generic drug:  Dulaglutide INJECT 1.5 MG INTO THE SKIN WEEKLY  Allergies: No Known Allergies  Past Medical History:  Diagnosis Date  . CAD (coronary artery disease)    Two RCA stents remotely / 3rd RCA stent 2006  . Colon polyps    s/p several Cscopes.  Marland Kitchen COPD (chronic obstructive pulmonary disease) (HCC)    on O2, nocturnal  . Diabetes mellitus    dx aprox 2009  . ED (erectile dysfunction)    has a vacumm device  . Ejection fraction   . Hyperlipidemia    dx in 90s  . Hypertension    dx in the 90  . Hypogonadism male   . PVD (peripheral vascular disease) ([place])    s/p stents at LE 2009, Dr Einar Gip  . Shortness of breath    O2 Sat dropped to 82% walking on the treadmill, September, 2012    Past Surgical History:  Procedure Laterality Date  . APPENDECTOMY    . TONSILLECTOMY      Family History  Problem Relation Age of Onset  . Heart disease Father   . Hypertension Father   . Stroke Father   . Diabetes Paternal Aunt   . Diabetes Maternal Grandmother   . Diabetes Other        GM, nephews, many family members  . Hyperlipidemia Other        ?  . Prostate cancer Brother   . Colon cancer Neg Hx     Social History:  reports that he quit smoking about 41 years ago. His smoking use included cigarettes. He has a 60.00 pack-year smoking history. He has never used smokeless tobacco. He reports current alcohol use. He reports that he does not use drugs.    Review of Systems        Lipids: He has been on treatment with simvastatin 20 mg with good control as follows       Lab Results  Component Value Date   CHOL 110 06/12/2018   HDL 35.20 (L) 06/12/2018   LDLCALC 58 06/12/2018   LDLDIRECT 55.0 01/01/2018   TRIG 85.0 06/12/2018   CHOLHDL 3 06/12/2018                   HYPOGONADISM He had decreased libido and  erectile dysfunction previously and was found to have hypogonadism, probably in 2011. He did have a slightly low free testosterone level in 2012 on treatment Prolactin level normal  Previously on AndroGel With his hemoglobin going up to 19.1 in August 2019 he has been told not to take any testosterone  Again does not think he has any increasing fatigue, decreased motivation or weakness, overall feels good His testosterone level is last back to normal even without his AndroGel   Lab Results  Component Value Date   TESTOSTERONE 328 08/29/2018    He has been evaluated by hematologist for his erythrocytosis Although hemoglobin is lower than his previous levels it is still not normal with leaving off the testosterone, still felt to be secondary erythrocytosis possibly secondary to CAD  CBC Latest Ref Rng & Units 11/18/2018 08/29/2018 08/19/2018  WBC 4.0 - 10.5 K/uL 8.4 7.3 9.8  Hemoglobin 13.0 - 17.0 g/dL 17.9(H) 18.0(HH) 17.8(H)  Hematocrit 39.0 - 52.0 % 53.2(H) 53.5(H) 54.4(H)  Platelets 150 - 400 K/uL 181 205.0 200          HYPERTENSION: This is followed by PCP and is on 100mg  of losartan along with half Maxide, amlodipine and metoprolol  He also takes blood pressure at home and this is  fairly good usually  BP Readings from Last 3 Encounters:  01/02/19 122/76  12/03/18 126/80  11/18/18 131/68     LABS:  Lab on 12/30/2018  Component Date Value Ref Range Status  . Microalb, Ur 12/30/2018 2.4* 0.0 - 1.9 mg/dL Final  . Creatinine,U 12/30/2018 208.6  mg/dL Final  . Microalb Creat Ratio 12/30/2018 1.1  0.0 - 30.0 mg/g Final  . Sodium 12/30/2018 140  135 - 145 mEq/L Final  . Potassium 12/30/2018 4.0  3.5 - 5.1 mEq/L Final  . Chloride 12/30/2018 105  96 - 112 mEq/L Final  . CO2 12/30/2018 24  19 - 32 mEq/L Final  . Glucose, Bld 12/30/2018 118* 70 - 99 mg/dL Final  . BUN 12/30/2018 18  6 - 23 mg/dL Final  . Creatinine, Ser 12/30/2018 1.49  0.40 - 1.50 mg/dL Final  . Total  Bilirubin 12/30/2018 0.6  0.2 - 1.2 mg/dL Final  . Alkaline Phosphatase 12/30/2018 65  39 - 117 U/L Final  . AST 12/30/2018 20  0 - 37 U/L Final  . ALT 12/30/2018 23  0 - 53 U/L Final  . Total Protein 12/30/2018 7.3  6.0 - 8.3 g/dL Final  . Albumin 12/30/2018 4.1  3.5 - 5.2 g/dL Final  . Calcium 12/30/2018 9.7  8.4 - 10.5 mg/dL Final  . GFR 12/30/2018 54.79* >60.00 mL/min Final  . Hgb A1c MFr Bld 12/30/2018 7.2* 4.6 - 6.5 % Final   Glycemic Control Guidelines for People with Diabetes:Non Diabetic:  <6%Goal of Therapy: <7%Additional Action Suggested:  >8%     Physical Examination:  BP 122/76 (BP Location: Left Arm, Patient Position: Sitting, Cuff Size: Normal)   Pulse 76   Ht 6' (1.829 m)   Wt 237 lb (107.5 kg)   SpO2 98%   BMI 32.14 kg/m   Diabetic Foot Exam - Simple   Simple Foot Form Diabetic Foot exam was performed with the following findings:  Yes   Visual Inspection No deformities, no ulcerations, no other skin breakdown bilaterally:  Yes See comments:  Yes Sensation Testing See comments:  Yes Pulse Check See comments:  Yes Comments Mild flexion deformities of toes especially left Monofilament sensation absent on distal toes on the plantar surface of the foot but present on the dorsum of the toes and normal Unable to feel posterior tibialis but dorsalis pedis pulses are 3/4 bilaterally    No pedal edema    ASSESSMENT/PLAN:   Diabetes type 2, with mild obesity, non-insulin-dependent  His A1c is 7.2, previously 6.2 Although he has reasonably good control of his diabetes for his age his blood sugars are trending higher He is on Jardiance 10 mg and may benefit from increasing the dose to help his cardiovascular risk also  Discussed that he can increase the Jardiance to 2 tablets for now and when finished call for prescription for 25 mg In the meantime he will reduce his LOSARTAN to half a tablet so that his renal function is not compromised He will come back in 4  weeks for retesting a BMP  He will continue Trulicity and metformin maximum doses  Complications: He has some signs of neuropathy with sensory loss on the plantar surfaces distally but minimal symptoms No microalbuminuria  HYPERTENSION: Well-controlled  Borderline renal function: Likely to be from nephrosclerosis  ERYTHROCYTOSIS  His hemoglobin is improving but still not back to normal with stopping testosterone supplements Most likely he has some degree of primary erythrocytosis and is going to continue follow-up with  hematologist  Hypogonadism, history of: Despite his stopping testosterone supplement his level was last normal Also he has no recurrence of fatigue are sluggishness and he will be continued on no treatment Also treatment is contraindicated because of his mild persistent erythrocytosis  Total visit time for evaluation and management of multiple problems and counseling =25 minutes  Patient Instructions  Take 2 jardiance 10 mg and cut Losartan in 1/2 Watch BP and call if unusually high     Jonathan [name] 01/02/2019, 8:55 AM   Note: This office note was prepared with Dragon voice recognition system technology. Any transcriptional errors that result from this process are unintentional.

## 2019-01-02 NOTE — Patient Instructions (Addendum)
Take 2 jardiance 10 mg and cut Losartan in 1/2 Watch BP and call if unusually high

## 2019-01-09 DIAGNOSIS — R350 Frequency of micturition: Secondary | ICD-10-CM | POA: Diagnosis not present

## 2019-01-09 DIAGNOSIS — N5201 Erectile dysfunction due to arterial insufficiency: Secondary | ICD-10-CM | POA: Diagnosis not present

## 2019-01-26 ENCOUNTER — Other Ambulatory Visit (INDEPENDENT_AMBULATORY_CARE_PROVIDER_SITE_OTHER): Payer: Medicare Other

## 2019-01-26 ENCOUNTER — Other Ambulatory Visit: Payer: Self-pay

## 2019-01-26 DIAGNOSIS — N182 Chronic kidney disease, stage 2 (mild): Secondary | ICD-10-CM | POA: Diagnosis not present

## 2019-01-26 LAB — BASIC METABOLIC PANEL
BUN: 20 mg/dL (ref 6–23)
CO2: 23 mEq/L (ref 19–32)
Calcium: 9.6 mg/dL (ref 8.4–10.5)
Chloride: 105 mEq/L (ref 96–112)
Creatinine, Ser: 1.33 mg/dL (ref 0.40–1.50)
GFR: 62.45 mL/min (ref >60.00)
Glucose, Bld: 130 mg/dL — ABNORMAL HIGH (ref 70–99)
Potassium: 4.2 mEq/L (ref 3.5–5.1)
Sodium: 138 mEq/L (ref 135–145)

## 2019-02-06 DIAGNOSIS — R350 Frequency of micturition: Secondary | ICD-10-CM | POA: Diagnosis not present

## 2019-02-06 DIAGNOSIS — N5201 Erectile dysfunction due to arterial insufficiency: Secondary | ICD-10-CM | POA: Diagnosis not present

## 2019-02-10 ENCOUNTER — Ambulatory Visit: Payer: Medicare Other | Admitting: Internal Medicine

## 2019-03-05 ENCOUNTER — Other Ambulatory Visit: Payer: Self-pay

## 2019-03-05 ENCOUNTER — Emergency Department (HOSPITAL_COMMUNITY): Payer: Medicare Other

## 2019-03-05 ENCOUNTER — Ambulatory Visit: Payer: Self-pay | Admitting: *Deleted

## 2019-03-05 ENCOUNTER — Emergency Department (HOSPITAL_COMMUNITY)
Admission: EM | Admit: 2019-03-05 | Discharge: 2019-03-05 | Disposition: A | Discharge status: Home / Self Care | Payer: Medicare Other | Attending: Emergency Medicine | Admitting: Emergency Medicine

## 2019-03-05 DIAGNOSIS — E1122 Type 2 diabetes mellitus with diabetic chronic kidney disease: Secondary | ICD-10-CM | POA: Diagnosis not present

## 2019-03-05 DIAGNOSIS — J449 Chronic obstructive pulmonary disease, unspecified: Secondary | ICD-10-CM | POA: Diagnosis not present

## 2019-03-05 DIAGNOSIS — I2510 Atherosclerotic heart disease of native coronary artery without angina pectoris: Secondary | ICD-10-CM | POA: Diagnosis not present

## 2019-03-05 DIAGNOSIS — R072 Precordial pain: Secondary | ICD-10-CM | POA: Diagnosis not present

## 2019-03-05 DIAGNOSIS — Z79899 Other long term (current) drug therapy: Secondary | ICD-10-CM | POA: Insufficient documentation

## 2019-03-05 DIAGNOSIS — Z87891 Personal history of nicotine dependence: Secondary | ICD-10-CM | POA: Diagnosis not present

## 2019-03-05 DIAGNOSIS — Z7982 Long term (current) use of aspirin: Secondary | ICD-10-CM | POA: Insufficient documentation

## 2019-03-05 DIAGNOSIS — N182 Chronic kidney disease, stage 2 (mild): Secondary | ICD-10-CM | POA: Insufficient documentation

## 2019-03-05 DIAGNOSIS — E1151 Type 2 diabetes mellitus with diabetic peripheral angiopathy without gangrene: Secondary | ICD-10-CM | POA: Diagnosis not present

## 2019-03-05 DIAGNOSIS — I129 Hypertensive chronic kidney disease with stage 1 through stage 4 chronic kidney disease, or unspecified chronic kidney disease: Secondary | ICD-10-CM | POA: Diagnosis not present

## 2019-03-05 DIAGNOSIS — R079 Chest pain, unspecified: Secondary | ICD-10-CM | POA: Insufficient documentation

## 2019-03-05 DIAGNOSIS — R0602 Shortness of breath: Secondary | ICD-10-CM | POA: Diagnosis not present

## 2019-03-05 LAB — HEPATIC FUNCTION PANEL
ALT: 21 U/L (ref 0–44)
AST: 23 U/L (ref 15–41)
Albumin: 3.7 g/dL (ref 3.5–5.0)
Alkaline Phosphatase: 52 U/L (ref 38–126)
Bilirubin, Direct: 0.2 mg/dL (ref 0.0–0.2)
Indirect Bilirubin: 0.5 mg/dL (ref 0.3–0.9)
Total Bilirubin: 0.7 mg/dL (ref 0.3–1.2)
Total Protein: 6.5 g/dL (ref 6.5–8.1)

## 2019-03-05 LAB — CBC WITH DIFFERENTIAL/PLATELET
Abs Immature Granulocytes: 0.03 10*3/uL (ref 0.00–0.07)
Basophils Absolute: 0.1 10*3/uL (ref 0.0–0.1)
Basophils Relative: 1 %
Eosinophils Absolute: 0.3 10*3/uL (ref 0.0–0.5)
Eosinophils Relative: 3 %
HCT: 50.5 % (ref 39.0–52.0)
Hemoglobin: 16.9 g/dL (ref 13.0–17.0)
Immature Granulocytes: 0 %
Lymphocytes Relative: 34 %
Lymphs Abs: 3.1 10*3/uL (ref 0.7–4.0)
MCH: 32.8 pg (ref 26.0–34.0)
MCHC: 33.5 g/dL (ref 30.0–36.0)
MCV: 97.9 fL (ref 80.0–100.0)
Monocytes Absolute: 0.9 10*3/uL (ref 0.1–1.0)
Monocytes Relative: 9 %
Neutro Abs: 4.9 10*3/uL (ref 1.7–7.7)
Neutrophils Relative %: 53 %
Platelets: 198 10*3/uL (ref 150–400)
RBC: 5.16 MIL/uL (ref 4.22–5.81)
RDW: 13.8 % (ref 11.5–15.5)
WBC: 9.2 10*3/uL (ref 4.0–10.5)
nRBC: 0 % (ref 0.0–0.2)

## 2019-03-05 LAB — BASIC METABOLIC PANEL
Anion gap: 11 (ref 5–15)
BUN: 23 mg/dL (ref 8–23)
CO2: 21 mmol/L — ABNORMAL LOW (ref 22–32)
Calcium: 9.4 mg/dL (ref 8.9–10.3)
Chloride: 107 mmol/L (ref 98–111)
Creatinine, Ser: 1.61 mg/dL — ABNORMAL HIGH (ref 0.61–1.24)
GFR calc Af Amer: 46 mL/min — ABNORMAL LOW (ref >60)
GFR calc non Af Amer: 40 mL/min — ABNORMAL LOW (ref >60)
Glucose, Bld: 157 mg/dL — ABNORMAL HIGH (ref 70–99)
Potassium: 3.6 mmol/L (ref 3.5–5.1)
Sodium: 139 mmol/L (ref 135–145)

## 2019-03-05 LAB — TROPONIN I
Troponin I: <0.03 ng/mL (ref <0.03)
Troponin I: <0.03 ng/mL (ref <0.03)

## 2019-03-05 LAB — BRAIN NATRIURETIC PEPTIDE: B Natriuretic Peptide: 17.6 pg/mL (ref 0.0–100.0)

## 2019-03-05 LAB — MAGNESIUM: Magnesium: 1.9 mg/dL (ref 1.7–2.4)

## 2019-03-05 MED ORDER — FAMOTIDINE 20 MG PO TABS: 20.0000 mg | ORAL_TABLET | Freq: Two times a day (BID) | ORAL | 0 refills | Status: DC

## 2019-03-05 NOTE — Telephone Encounter (Signed)
FYI

## 2019-03-05 NOTE — Discharge Instructions (Signed)

## 2019-03-05 NOTE — ED Triage Notes (Addendum)
Pt c/o chest pain x4 days. Pt denies recent SOB, nausea, or diaphoresis. Pt states he took Pepcid today, roughly 4 hours PTA. Pt is alert and oriented x4.

## 2019-03-05 NOTE — Telephone Encounter (Signed)
Agree, will wait for ER notes

## 2019-03-05 NOTE — Telephone Encounter (Signed)
Spoke w/ Pt- he took Pepcid and felt better, instructed that PCP still recommended he go. Pt verbalized understanding.

## 2019-03-05 NOTE — ED Provider Notes (Signed)
[facility] EMERGENCY DEPARTMENT Provider Note   CSN: [id number] Arrival date & time: 03/05/19  1605    History   Chief Complaint No chief complaint on file.   HPI Jonathan Arney. is a 83 y.o. male.  HPI: A 83 year old patient with a history of treated diabetes, hypertension, hypercholesterolemia and obesity presents for evaluation of chest pain. Initial onset of pain was more than 6 hours ago. The patient's chest pain is not worse with exertion. The patient's chest pain is middle- or left-sided, is not well-localized, is not described as heaviness/pressure/tightness, is not sharp and does not radiate to the arms/jaw/neck. The patient does not complain of nausea and denies diaphoresis. The patient has no history of stroke, has no history of peripheral artery disease, has not smoked in the past 90 days and has no relevant family history of coronary artery disease (first degree relative at less than age 49).   The history is provided by the patient and medical records.  Chest Pain Pain location:  Epigastric and substernal area Pain quality: burning   Pain quality: not crushing, no pressure, not stabbing, not tearing, not throbbing and no tightness   Pain radiates to:  Does not radiate Pain severity:  Mild Onset quality:  Gradual Timing:  Intermittent Progression:  Unchanged Chronicity:  New Context: eating, movement and at rest   Context: not stress and not trauma   Relieved by:  Antacids (pepcid) Worsened by:  Nothing Ineffective treatments:  Rest, oxygen, leaning forward and certain positions Associated symptoms: no anxiety, no cough, no diaphoresis, no fever, no lower extremity edema, no orthopnea, no palpitations, no PND, no shortness of breath and no vomiting   Risk factors: coronary artery disease, diabetes mellitus and male sex     Past Medical History:  Diagnosis Date  . CAD (coronary artery disease)    Two RCA stents remotely / 3rd RCA stent 2006   . Colon polyps    s/p several Cscopes.  Marland Kitchen COPD (chronic obstructive pulmonary disease) (HCC)    on O2, nocturnal  . Diabetes mellitus    dx aprox 2009  . ED (erectile dysfunction)    has a vacumm device  . Ejection fraction   . Hyperlipidemia    dx in 90s  . Hypertension    dx in the 90  . Hypogonadism male   . PVD (peripheral vascular disease) ([place])    s/p stents at LE 2009, Dr Einar Gip  . Shortness of breath    O2 Sat dropped to 82% walking on the treadmill, September, 2012    Patient Active Problem List   Diagnosis Date Noted  . Polycythemia, secondary 08/18/2018  . Chronic respiratory failure with hypoxia ([place]) 02/28/2017  . Dyspnea on exertion 01/24/2017  . PCP NOTES >>>>>>>>>>>>>>>>>>> 04/03/2016  . Coronary artery disease involving native coronary artery of native heart without angina pectoris 01/23/2016  . Cramp of both lower extremities 01/23/2016  . Hypogonadism male 12/16/2014  . Chronic kidney disease, stage II (mild) 02/28/2014  . Pain in lower limb ([place]) 01/08/2014  . Ejection fraction   . Onychomycosis 04/10/2013  . Essential tremor 10/14/2012  . Annual physical exam 12/11/2011  . Vitamin D deficiency 12/11/2011  . Back pain, chronic 06/18/2011  . COPD mixed type ([place])   . CAD (coronary artery disease)   . PVD (peripheral vascular disease) ([place])   . HEADACHE 10/06/2010  . Hypogonadism in male 06/21/2010  . DMII (diabetes mellitus, type 2) ([place]) 03/17/2010  .  Hyperlipidemia 03/17/2010  . Essential hypertension 03/17/2010    Past Surgical History:  Procedure Laterality Date  . APPENDECTOMY    . TONSILLECTOMY          Home Medications    Prior to Admission medications   Medication Sig Start Date End Date Taking? Authorizing Provider  albuterol (PROVENTIL HFA) 108 (90 Base) MCG/ACT inhaler Inhale 2 puffs into the lungs every 6 (six) hours as needed for wheezing or shortness of breath.   Yes [provider]  amLODipine (NORVASC) 10 MG  tablet TAKE 1 TABLET DAILY Patient taking differently: Take 10 mg by mouth daily.  02/28/15  Yes Carlena Bjornstad, MD  aspirin 81 MG tablet Take 81 mg by mouth daily.     Yes [provider]  empagliflozin (JARDIANCE) 25 MG TABS tablet Take 12.5 mg by mouth daily.   Yes [provider]  gabapentin (NEURONTIN) 100 MG capsule Take 1 capsule (100 mg total) by mouth 3 (three) times daily. Patient taking differently: Take 100 mg by mouth daily as needed.  07/31/18  Yes Paz, Alda Berthold, MD  losartan (COZAAR) 100 MG tablet Take 1 tablet (100 mg total) by mouth daily. 06/10/17  Yes Paz, Alda Berthold, MD  metFORMIN (GLUCOPHAGE) 1000 MG tablet Take 1,000 mg by mouth daily with breakfast.    Yes [provider]  metoprolol (TOPROL-XL) 200 MG 24 hr tablet Take 100 mg by mouth daily.   Yes [provider]  Multiple Vitamin (MULTIVITAMIN WITH MINERALS) TABS tablet Take 1 tablet by mouth daily.   Yes [provider]  simvastatin (ZOCOR) 20 MG tablet Take 20 mg by mouth at bedtime.  08/15/17  Yes Paz, Alda Berthold, MD  Tiotropium Bromide-Olodaterol (STIOLTO RESPIMAT) 2.5-2.5 MCG/ACT AERS Inhale 2 puffs into the lungs daily as needed (for flares).    Yes [provider]  triamterene-hydrochlorothiazide (MAXZIDE-25) 37.5-25 MG tablet Take 1 tablet by mouth daily.  08/15/17  Yes Paz, Alda Berthold, MD  TRULICITY 1.5 WU/9.8JX SOPN INJECT 1.5 MG INTO THE SKIN WEEKLY Patient taking differently: Inject 1.5 mg into the skin every Saturday.  08/22/18  Yes Elayne Snare, MD  Blood Glucose Monitoring Suppl (FREESTYLE LITE) DEVI 1 each by Does not apply route daily. Use as instructed to check blood sugar twice daily.  DX: E11.9 01/27/18   Elayne Snare, MD  cyclobenzaprine (FLEXERIL) 10 MG tablet Take 1 tablet (10 mg total) by mouth at bedtime as needed for muscle spasms. Patient not taking: Reported on 03/05/2019 12/03/18   Colon Branch, MD  empagliflozin (JARDIANCE) 10 MG TABS tablet Take 10 mg by mouth  daily. Patient not taking: Reported on 03/05/2019 03/11/17   Elayne Snare, MD  famotidine (PEPCID) 20 MG tablet Take 1 tablet (20 mg total) by mouth 2 (two) times daily. 03/05/19   Lonzo Candy, MD  glucose blood (FREESTYLE LITE) test strip Use as instructed to check blood sugar twice daily. 01/27/18   Elayne Snare, MD  Lancets (FREESTYLE) lancets USE AS INSTRUCTED TO CHECK BLOOD SUGAR TWICE A DAY 01/20/18   Elayne Snare, MD  metoprolol succinate (TOPROL XL) 100 MG 24 hr tablet Take 1 tablet (100 mg total) by mouth daily. Patient not taking: Reported on 03/05/2019 07/28/18   Colon Branch, MD    Family History Family History  Problem Relation Age of Onset  . Heart disease Father   . Hypertension Father   . Stroke Father   . Diabetes Paternal Aunt   . Diabetes  Maternal Grandmother   . Diabetes Other        GM, nephews, many family members  . Hyperlipidemia Other        ?  . Prostate cancer Brother   . Colon cancer Neg Hx     Social History Social History   Tobacco Use  . Smoking status: Former Smoker    Packs/day: 2.00    Years: 30.00    Pack years: 60.00    Types: Cigarettes    Quit date: 06/17/1977    Years since quitting: 41.7  . Smokeless tobacco: Never Used  . Tobacco comment: 2 ppd, quit 1987  Substance Use Topics  . Alcohol use: Yes    Alcohol/week: 0.0 standard drinks    Comment: socially   . Drug use: No     Allergies   Patient has no known allergies.   Review of Systems Review of Systems  Constitutional: Negative for diaphoresis and fever.  Respiratory: Negative for cough and shortness of breath.   Cardiovascular: Positive for chest pain. Negative for palpitations, orthopnea and PND.  Gastrointestinal: Negative for vomiting.  All other systems reviewed and are negative.    Physical Exam Updated Vital Signs BP 122/75   Pulse 73   Temp 97.7 F (36.5 C) (Oral)   Resp 14   SpO2 94%   Physical Exam Vitals signs and nursing note reviewed.  Constitutional:       Appearance: He is well-developed.  HENT:     Head: Normocephalic and atraumatic.     Mouth/Throat:     Mouth: Mucous membranes are moist.  Eyes:     Conjunctiva/sclera: Conjunctivae normal.  Neck:     Musculoskeletal: Neck supple.  Cardiovascular:     Rate and Rhythm: Normal rate.  Pulmonary:     Effort: Pulmonary effort is normal. No respiratory distress.     Breath sounds: No stridor. No wheezing or rhonchi.  Chest:     Chest wall: No tenderness.  Abdominal:     Palpations: Abdomen is soft.     Tenderness: There is no abdominal tenderness.  Musculoskeletal:     Right lower leg: No edema.     Left lower leg: No edema.  Skin:    General: Skin is warm and dry.     Capillary Refill: Capillary refill takes less than 2 seconds.     Findings: No rash.  Neurological:     Mental Status: He is alert and oriented to person, place, and time.      ED Treatments / Results  Labs (all labs ordered are listed, but only abnormal results are displayed) Labs Reviewed  BASIC METABOLIC PANEL - Abnormal; Notable for the following components:      Result Value   CO2 21 (*)    Glucose, Bld 157 (*)    Creatinine, Ser 1.61 (*)    GFR calc non Af Amer 40 (*)    GFR calc Af Amer 46 (*)    All other components within normal limits  CBC WITH DIFFERENTIAL/PLATELET  HEPATIC FUNCTION PANEL  TROPONIN I  BRAIN NATRIURETIC PEPTIDE  MAGNESIUM  TROPONIN I    EKG EKG Interpretation  Date/Time:  Thursday March 05 2019 16:16:07 EDT Ventricular Rate:  80 PR Interval:    QRS Duration: 82 QT Interval:  369 QTC Calculation: 426 R Axis:   39 Text Interpretation:  Sinus rhythm Prolonged PR interval Low voltage, precordial leads Consider anterior infarct No significant change since last tracing Confirmed by Maryan Rued,  [facility] [phone]) on 03/05/2019 4:45:02 PM   Radiology Dg Chest Portable 1 View  Result Date: 03/05/2019 CLINICAL DATA:  Chest pain EXAM: PORTABLE CHEST 1 VIEW COMPARISON:  February 15, 2017 FINDINGS: The cardiac silhouette is enlarged but stable from prior study. There is no pneumothorax. No large pleural effusion. There is mild volume overload without overt pulmonary edema. There is no acute displaced fracture. IMPRESSION: Borderline cardiomegaly with volume overload. Electronically Signed   By: Constance Holster M.D.   On: 03/05/2019 17:16    Procedures Procedures (including critical care time)  Medications Ordered in ED Medications - No data to display   Initial Impression / Assessment and Plan / ED Course  I have reviewed the triage vital signs and the nursing notes.  Pertinent labs & imaging results that were available during my care of the patient were reviewed by me and considered in my medical decision making (see chart for details).     HEAR Score: 4  Medical Decision Making: Jonathan [name]. is a 83 y.o. male who presented to the ED today with chest pain, chest "burning".  Past medical history significant for CAD, stenting x3 previously early to mid 2000s, peripheral vascular disease with stenting 2009, hypertension, diabetes, COPD Reviewed and confirmed nursing documentation for past medical history, family history, social history.  On my initial exam, the pt was calm, cooperative, conversant, follows commands appropriately, GCS 15, not tachycardic, not hypotensive, afebrile, no increased work of breathing or respiratory distress, no signs of impending respiratory failure.   The patient's risk factors for ACS were reviewed as well as the EKG. The CXR assists in r/o Pneumonia, Pneumothorax, Esophageal Tears.  No focal lung findings suggestive of pneumonia or pneumothorax. The patient does not appear to have a Pulmonary Embolism based on the Wells Score and there is no apparent asymmetric upper extremity or lower extremity edema/swelling suggestive of DVT.  Patient denies any fevers, change in chest pain with leaning forward or lying down making  pericarditis, myocarditis less likely. There does not appear to be an Aortic Dissection either based on physical exam, historically pain not abrupt in onset, tearing or ripping, pulses symmetric. MSK strain vs Costochondritis are also of consideration however (-)Rooster crowing sign, pain not reproducible with palpation.  Pain could be secondary to reflux given substernal, radiates to esophagus, exacerbated with food, relief with H2 blocker  EKG (my interpretation):      NS rhythm with a rate of  80.      QRS 82. QTc 426.      No acute ischemic ST-T segment changes.      No acute changes suggestive of hyperkalemia.      No WPW, LQTS, or Brugada's Syndrome. When compared to previous ECGs from 03/25/18 change no new concerning changes found.  Will obtain cardiac enzymes, chest x-ray, blood work, reassess, patient currently pain-free in emergency department Heart score high risk given history of CAD  All radiology and laboratory studies reviewed independently and with my attending physician, agree with reading provided by radiologist unless otherwise noted.  White cell blood cells 9.2, hemoglobin 16.9, creatinine 1.6, CO2 21, initial troponin negative, mag 1.9 Chest x-ray showed borderline cardiomegaly with volume overload without any overt pulmonary edema  Upon reassessing patient, patient was calm, resting comfortably, awaiting second troponin Second troponin negative Will prescribe patient H2 blocker for home use Offered patient admission given high risk cardiac history however patient takes care of wife as she has severe Alzheimer's, does  not have anyone else who can help take care of her, and shared decision making patient will follow-up with PCP and cardiology closely as an outpatient for further evaluation and care Based on the above findings, I believe patient is hemodynamically stable for discharge.  Patient educated about specific return precautions for given chief complaint and symptoms.   Patient educated about follow-up with PCP and cardiology.  Patient expressed understanding of return precautions and need for follow-up.  Patient discharged.  The above care was discussed with and agreed upon by my attending physician. Emergency Department Medication Summary:  Medications - No data to display  Final Clinical Impressions(s) / ED Diagnoses   Final diagnoses:  Chest pain in adult    ED Discharge Orders         Ordered    famotidine (PEPCID) 20 MG tablet  2 times daily     03/05/19 2057           Lonzo Candy, MD 03/05/19 2057    Blanchie Dessert, MD 03/05/19 2118

## 2019-03-05 NOTE — Telephone Encounter (Signed)
Patient is having chest pain- middle of chest for several days- that is constant and not improving. Patient states eating does not effect it- he has not tried antiacid for relief. Due to patient history- advise Ed for check of symptoms- patient will go. Patient is at dentist with wife- will go when they get finished. Reason for Disposition . [1] Chest pain lasts > 5 minutes AND [2] history of heart disease  (i.e., heart attack, bypass surgery, angina, angioplasty, CHF; not just a heart murmur)  Answer Assessment - Initial Assessment Questions 1. LOCATION: "Where does it hurt?"       Center of chest 2. RADIATION: "Does the pain go anywhere else?" (e.g., into neck, jaw, arms, back)     No radiation 3. ONSET: "When did the chest pain begin?" (Minutes, hours or days)      3-4 days 4. PATTERN "Does the pain come and go, or has it been constant since it started?"  "Does it get worse with exertion?"      Constant- burning pain 5. DURATION: "How long does it last" (e.g., seconds, minutes, hours)     ongoing 6. SEVERITY: "How bad is the pain?"  (e.g., Scale 1-10; mild, moderate, or severe)    - MILD (1-3): doesn't interfere with normal activities     - MODERATE (4-7): interferes with normal activities or awakens from sleep    - SEVERE (8-10): excruciating pain, unable to do any normal activities       mild 7. CARDIAC RISK FACTORS: "Do you have any history of heart problems or risk factors for heart disease?" (e.g., prior heart attack, angina; high blood pressure, diabetes, being overweight, high cholesterol, smoking, or strong family history of heart disease)     Yes- history of heart problem 8. PULMONARY RISK FACTORS: "Do you have any history of lung disease?"  (e.g., blood clots in lung, asthma, emphysema, birth control pills)     Breathing problems-pain has not made worse 9. CAUSE: "What do you think is causing the chest pain?"     no 10. OTHER SYMPTOMS: "Do you have any other symptoms?" (e.g.,  dizziness, nausea, vomiting, sweating, fever, difficulty breathing, cough)       no 11. PREGNANCY: "Is there any chance you are pregnant?" "When was your last menstrual period?"       n/a  Protocols used: CHEST PAIN-A-AH

## 2019-03-05 NOTE — ED Notes (Signed)
E-SIGNATURE NOT AVAILABLE, VERBALIZED UNDERSTANDING OF DC INSTRUCTIONS AND PRESCRIPTIONS.

## 2019-03-06 ENCOUNTER — Telehealth: Payer: Self-pay | Admitting: Internal Medicine

## 2019-03-06 NOTE — Telephone Encounter (Signed)
In person please

## 2019-03-06 NOTE — Telephone Encounter (Signed)
Please schedule a ER f/u within next few days, if pt has sxs (CP, SOB) after ER visit, let me know

## 2019-03-09 NOTE — Telephone Encounter (Signed)
Done

## 2019-03-13 ENCOUNTER — Ambulatory Visit (INDEPENDENT_AMBULATORY_CARE_PROVIDER_SITE_OTHER): Payer: Medicare Other | Admitting: Internal Medicine

## 2019-03-13 ENCOUNTER — Other Ambulatory Visit: Payer: Self-pay

## 2019-03-13 ENCOUNTER — Encounter: Payer: Self-pay | Admitting: Internal Medicine

## 2019-03-13 VITALS — BP 122/63 | HR 76 | Temp 98.2°F | Resp 16 | Ht 72.0 in | Wt 237.0 lb

## 2019-03-13 DIAGNOSIS — R079 Chest pain, unspecified: Secondary | ICD-10-CM

## 2019-03-13 NOTE — Progress Notes (Signed)
Subjective:    Patient ID: Jonathan Leather., male    DOB: Apr 16, 1936, 83 y.o.   MRN: [id number]  DOS:  03/13/2019 Type of visit - description: ER f/u  Went to the ER 03/05/2019,He complained of chest pain. Chest pain was in the mid anterior chest, described as burning, happened 3 nights in a row, lasted few hours each time.  Work-up: Chest x-ray showed cardiomegaly, CBC unremarkable, creatinine 1.6, minimally elevated, troponin negative, BNP normal EKG without acute changes. Patient was released home on Pepcid.  No further symptoms since then.  Review of Systems  Denies fever chills No difficulty breathing or edema No nausea, vomiting, diarrhea.  No blood in the stools.  No abdominal pain or classic GERD symptoms No cough No dizziness Past Medical History:  Diagnosis Date   CAD (coronary artery disease)    Two RCA stents remotely / 3rd RCA stent 2006   Colon polyps    s/p several Cscopes.   COPD (chronic obstructive pulmonary disease) (HCC)    on O2, nocturnal   Diabetes mellitus    dx aprox 2009   ED (erectile dysfunction)    has a vacumm device   Ejection fraction    Hyperlipidemia    dx in 90s   Hypertension    dx in the 62   Hypogonadism male    PVD (peripheral vascular disease) ([place])    s/p stents at LE 2009, Dr Einar Gip   Shortness of breath    O2 Sat dropped to 82% walking on the treadmill, September, 2012    Past Surgical History:  Procedure Laterality Date   APPENDECTOMY     TONSILLECTOMY      Social History   Socioeconomic History   Marital status: Married    Spouse name: Writer    Number of children: 6   Years of education: Not on file   Highest education level: Not on file  Occupational History   Occupation: retired, 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   Social Designer, fashion/clothing strain: Not on file   Food insecurity    Worry: Not on file    Inability: Not on Lexicographer needs    Medical: Not  on file    Non-medical: Not on file  Tobacco Use   Smoking status: Former Smoker    Packs/day: 2.00    Years: 30.00    Pack years: 60.00    Types: Cigarettes    Quit date: 06/17/1977    Years since quitting: 41.7   Smokeless tobacco: Never Used   Tobacco comment: 2 ppd, quit 1987  Substance and Sexual Activity   Alcohol use: Yes    Alcohol/week: 0.0 standard drinks    Comment: socially    Drug use: No   Sexual activity: Yes  Lifestyle   Physical activity    Days per week: Not on file    Minutes per session: Not on file   Stress: Not on file  Relationships   Social connections    Talks on phone: Not on file    Gets together: Not on file    Attends religious service: Not on file    Active member of club or organization: Not on file    Attends meetings of clubs or organizations: Not on file    Relationship status: Not on file   Intimate partner violence    Fear of current or ex partner: Not on file  Emotionally abused: Not on file    Physically abused: Not on file    Forced sexual activity: Not on file  Other Topics Concern   Not on file  Social History Narrative   4 children (lost 1 son)   Wife has 2 children                   Allergies as of 03/13/2019   No Known Allergies     Medication List       Accurate as of March 13, 2019 11:59 PM. If you have any questions, ask your nurse or doctor.        amLODipine 10 MG tablet Commonly known as: NORVASC TAKE 1 TABLET DAILY   aspirin 81 MG tablet Take 81 mg by mouth daily.   cyclobenzaprine 10 MG tablet Commonly known as: FLEXERIL Take 1 tablet (10 mg total) by mouth at bedtime as needed for muscle spasms.   famotidine 20 MG tablet Commonly known as: Pepcid Take 1 tablet (20 mg total) by mouth 2 (two) times daily.   freestyle lancets USE AS INSTRUCTED TO CHECK BLOOD SUGAR TWICE A DAY   FreeStyle Lite Devi 1 each by Does not apply route daily. Use as instructed to check blood sugar twice  daily.  DX: E11.9   gabapentin 100 MG capsule Commonly known as: NEURONTIN Take 1 capsule (100 mg total) by mouth 3 (three) times daily. What changed:   when to take this  reasons to take this   glucose blood test strip Commonly known as: FREESTYLE LITE Use as instructed to check blood sugar twice daily.   Jardiance 25 MG Tabs tablet Generic drug: empagliflozin Take 12.5 mg by mouth daily. What changed: Another medication with the same name was removed. Continue taking this medication, and follow the directions you see here. Changed by: Kathlene November, MD   losartan 100 MG tablet Commonly known as: COZAAR Take 1 tablet (100 mg total) by mouth daily.   metFORMIN 1000 MG tablet Commonly known as: GLUCOPHAGE Take 1,000 mg by mouth daily with breakfast.   metoprolol succinate 100 MG 24 hr tablet Commonly known as: Toprol XL Take 1 tablet (100 mg total) by mouth daily. What changed: Another medication with the same name was removed. Continue taking this medication, and follow the directions you see here. Changed by: Kathlene November, MD   multivitamin with minerals Tabs tablet Take 1 tablet by mouth daily.   Proventil HFA 108 (90 Base) MCG/ACT inhaler Generic drug: albuterol Inhale 2 puffs into the lungs every 6 (six) hours as needed for wheezing or shortness of breath.   simvastatin 20 MG tablet Commonly known as: ZOCOR Take 20 mg by mouth at bedtime.   Stiolto Respimat 2.5-2.5 MCG/ACT Aers Generic drug: Tiotropium Bromide-Olodaterol Inhale 2 puffs into the lungs daily as needed (for flares).   triamterene-hydrochlorothiazide 37.5-25 MG tablet Commonly known as: MAXZIDE-25 Take 1 tablet by mouth daily.   Trulicity 1.5 KV/4.2VZ Sopn Generic drug: Dulaglutide INJECT 1.5 MG INTO THE SKIN WEEKLY What changed: See the new instructions.           Objective:   Physical Exam BP 122/63 (BP Location: Left Arm, Patient Position: Sitting, Cuff Size: Small)    Pulse 76    Temp  98.2 F (36.8 C) (Oral)    Resp 16    Ht 6' (1.829 m)    Wt 237 lb (107.5 kg)    SpO2 91%    BMI 32.14 kg/m  General:   Well developed, NAD, BMI noted.  HEENT:  Normocephalic . Face symmetric, atraumatic Lungs:  CTA B Normal respiratory effort, no intercostal retractions, no accessory muscle use. Heart: RRR,  no murmur.  no pretibial edema bilaterally  Abdomen:  Not distended, soft, non-tender. No rebound or rigidity.   Skin: Not pale. Not jaundice Neurologic:  alert & oriented X3.  Speech normal, gait appropriate for age and unassisted Psych--  Cognition and judgment appear intact.  Cooperative with normal attention span and concentration.  Behavior appropriate. No anxious or depressed appearing.     Assessment    Assessment DM dx ~4734, complicated by CAD, PVD, mild neuropathy. Sees Dr. Dwyane Dee Hypogonadism. Sees Dr. Dwyane Dee HTN Hyperlipidemia COPD, nocturnal O2 prn Back pain, chronic: See visit 10/12/2016 CV: -CAD, Dr. Ron Parker Dr Meda Coffee S/p  stenting 2 to the RCA remote past and the third RCA stent in  2006, negative stress test in 2012, LVEF 55% on echo 2015 -Peripheral vascular disease, stents lower extremity 2009  ED Polycythemia: Saw hematology 11-2018, felt to be due to chronic respiratory failure.  Not likely polycythemia vera  PLAN    Chest pain: As described above, sxs lasted few hours every night for 3 nights, work-up at the ER was negative including a negative troponin in the context of hours of pain.  Unlikely to represent CAD, better with Pepcid, likely GI in etiology.  Continue Pepcid. See next + Hemoccult: The patient was recently notified from the [place] that he is Hemoccult become positive and they are planning further testing.  Currently with no GI symptoms and no discoloration of the stools.  Last hemoglobin is stable. RTC already scheduled for next month.  Patient will call sooner if needed

## 2019-03-13 NOTE — Progress Notes (Signed)
Pre visit review using our clinic review tool, if applicable. No additional management support is needed unless otherwise documented below in the visit note. 

## 2019-03-15 NOTE — Assessment & Plan Note (Signed)
Chest pain: As described above, sxs lasted few hours every night for 3 nights, work-up at the ER was negative including a negative troponin in the context of hours of pain.  Unlikely to represent CAD, better with Pepcid, likely GI in etiology.  Continue Pepcid. See next + Hemoccult: The patient was recently notified from the [place] that he is Hemoccult become positive and they are planning further testing.  Currently with no GI symptoms and no discoloration of the stools.  Last hemoglobin is stable. RTC already scheduled for next month.  Patient will call sooner if needed

## 2019-03-29 ENCOUNTER — Other Ambulatory Visit: Payer: Self-pay | Admitting: Endocrinology

## 2019-03-29 DIAGNOSIS — E1165 Type 2 diabetes mellitus with hyperglycemia: Secondary | ICD-10-CM

## 2019-03-29 DIAGNOSIS — E291 Testicular hypofunction: Secondary | ICD-10-CM

## 2019-03-31 ENCOUNTER — Other Ambulatory Visit: Payer: Self-pay

## 2019-03-31 ENCOUNTER — Other Ambulatory Visit (INDEPENDENT_AMBULATORY_CARE_PROVIDER_SITE_OTHER): Payer: Medicare Other

## 2019-03-31 DIAGNOSIS — E291 Testicular hypofunction: Secondary | ICD-10-CM

## 2019-03-31 DIAGNOSIS — E1165 Type 2 diabetes mellitus with hyperglycemia: Secondary | ICD-10-CM

## 2019-03-31 LAB — TESTOSTERONE: Testosterone: 280.49 ng/dL — ABNORMAL LOW (ref 300.00–890.00)

## 2019-03-31 LAB — BASIC METABOLIC PANEL
BUN: 19 mg/dL (ref 6–23)
CO2: 23 mEq/L (ref 19–32)
Calcium: 9.2 mg/dL (ref 8.4–10.5)
Chloride: 107 mEq/L (ref 96–112)
Creatinine, Ser: 1.41 mg/dL (ref 0.40–1.50)
GFR: 58.36 mL/min — ABNORMAL LOW (ref >60.00)
Glucose, Bld: 134 mg/dL — ABNORMAL HIGH (ref 70–99)
Potassium: 3.9 mEq/L (ref 3.5–5.1)
Sodium: 140 mEq/L (ref 135–145)

## 2019-03-31 LAB — HEMOGLOBIN A1C: Hgb A1c MFr Bld: 7.1 % — ABNORMAL HIGH (ref 4.6–6.5)

## 2019-04-02 ENCOUNTER — Ambulatory Visit (INDEPENDENT_AMBULATORY_CARE_PROVIDER_SITE_OTHER): Payer: Medicare Other | Admitting: Endocrinology

## 2019-04-02 ENCOUNTER — Encounter: Payer: Self-pay | Admitting: Endocrinology

## 2019-04-02 ENCOUNTER — Other Ambulatory Visit: Payer: Self-pay

## 2019-04-02 VITALS — BP 126/78 | HR 80 | Temp 97.6°F | Ht 72.0 in | Wt 238.2 lb

## 2019-04-02 DIAGNOSIS — E1165 Type 2 diabetes mellitus with hyperglycemia: Secondary | ICD-10-CM

## 2019-04-02 DIAGNOSIS — K5730 Diverticulosis of large intestine without perforation or abscess without bleeding: Secondary | ICD-10-CM | POA: Diagnosis not present

## 2019-04-02 DIAGNOSIS — K921 Melena: Secondary | ICD-10-CM | POA: Diagnosis not present

## 2019-04-02 DIAGNOSIS — E291 Testicular hypofunction: Secondary | ICD-10-CM | POA: Diagnosis not present

## 2019-04-02 DIAGNOSIS — Z1211 Encounter for screening for malignant neoplasm of colon: Secondary | ICD-10-CM | POA: Diagnosis not present

## 2019-04-02 NOTE — Progress Notes (Signed)
Patient ID: Jonathan [name]., male   DOB: 07/19/1936, 83 y.o.   MRN: [id number]    Reason for Appointment:  Follow-up  History of Present Illness:          Diagnosis: Type 2 diabetes mellitus, date of diagnosis:  2009      Past history: He is not clear what his diabetes was diagnosed but according to hospital records it may have been in 2009. Most likely he was placed on metformin initially and at some point Amaryl added. In 2013 it was also given Januvia presumably to improve his control. However appears that his A1c has been consistently over 7% Janumet was started instead of metformin and Januvia separately in 2013  He was started on Trulicity in 6/43 instead of Victoza because excessive bruising on his abdomen  Later in 01/2015 he was switched to Bydureon because of insurance noncoverage of his Trulicity  Recent history:   Non-insulin hypoglycemic drugs are: Metformin 1000 twice a day, Trulicity 1.5 mg weekly, Jardiance 25 mg daily  His A1c is still relatively high at 7.1 compared to 7.2    Current blood sugar patterns and problems:  He has been not been checking his blood sugars much lately but mostly in the morning overall   Also some morning readings may be after breakfast  He is not losing weight but he said that his waist size has gone down  He is only doing housework for his activities and not able to go to the gym or do any brisk walking outside  JARDIANCE has been increased to 25 mg since her sugars are higher  Also his metformin is now 1000 mg twice daily  No side effects with any of his diabetes medicine He thinks his blood sugars do not go up after meals but recent results not available     Side effects from medications have been: None  Glucose monitoring:  done  Once a day       Glucometer:  FreeStyle Blood Glucose readings  by download    PRE-MEAL  morning Lunch Dinner Bedtime Overall  Glucose range:  103-158      Mean/median:      129    POST-MEAL PC Breakfast PC Lunch PC Dinner  Glucose range:  189    Mean/median:      PREVIOUS readings:  PRE-MEAL Fasting Lunch Dinner Bedtime Overall  Glucose range:  110-157  100     Mean/median:  128     132   POST-MEAL PC Breakfast PC Lunch PC Dinner  Glucose range:  164  106  120-155       Meals: 3 meals per day. eating egg/meat bread or Boost for breakfast, dinner 6-7 PM    Exercise:  No formal exercise, previously was getting on exercise equipment at home for up to 60 minutes   Dietician visit: Most recent: 4/16 .               Weight history:  Wt Readings from Last 3 Encounters:  04/02/19 238 lb 3.2 oz (108 kg)  03/13/19 237 lb (107.5 kg)  01/02/19 237 lb (107.5 kg)   Glycemic control:   Lab Results  Component Value Date   HGBA1C 7.1 (H) 03/31/2019   HGBA1C 7.2 (H) 12/30/2018   HGBA1C 6.2 (A) 09/02/2018   Lab Results  Component Value Date   MICROALBUR 2.4 (H) 12/30/2018   LDLCALC 58 06/12/2018   CREATININE 1.41 03/31/2019    Lab  Results  Component Value Date   FRUCTOSAMINE 284 03/29/2017   FRUCTOSAMINE 268 12/06/2016   FRUCTOSAMINE 282 02/22/2014    OTHER active problems addressed today: See review of systems  Lab on 03/31/2019  Component Date Value Ref Range Status  . Testosterone 03/31/2019 280.49* 300.00 - 890.00 ng/dL Final  . Sodium 03/31/2019 140  135 - 145 mEq/L Final  . Potassium 03/31/2019 3.9  3.5 - 5.1 mEq/L Final  . Chloride 03/31/2019 107  96 - 112 mEq/L Final  . CO2 03/31/2019 23  19 - 32 mEq/L Final  . Glucose, Bld 03/31/2019 134* 70 - 99 mg/dL Final  . BUN 03/31/2019 19  6 - 23 mg/dL Final  . Creatinine, Ser 03/31/2019 1.41  0.40 - 1.50 mg/dL Final  . Calcium 03/31/2019 9.2  8.4 - 10.5 mg/dL Final  . GFR 03/31/2019 58.36* >60.00 mL/min Final  . Hgb A1c MFr Bld 03/31/2019 7.1* 4.6 - 6.5 % Final   Glycemic Control Guidelines for People with Diabetes:Non Diabetic:  <6%Goal of Therapy: <7%Additional Action Suggested:  >8%       Allergies as of 04/02/2019   No Known Allergies     Medication List       Accurate as of April 02, 2019 10:09 AM. If you have any questions, ask your nurse or doctor.        amLODipine 10 MG tablet Commonly known as: NORVASC TAKE 1 TABLET DAILY   aspirin 81 MG tablet Take 81 mg by mouth daily.   cyclobenzaprine 10 MG tablet Commonly known as: FLEXERIL Take 1 tablet (10 mg total) by mouth at bedtime as needed for muscle spasms.   famotidine 20 MG tablet Commonly known as: Pepcid Take 1 tablet (20 mg total) by mouth 2 (two) times daily.   freestyle lancets USE AS INSTRUCTED TO CHECK BLOOD SUGAR TWICE A DAY   FreeStyle Lite Devi 1 each by Does not apply route daily. Use as instructed to check blood sugar twice daily.  DX: E11.9   FREESTYLE LITE test strip Generic drug: glucose blood USE TO CHECK BLOOD SUGAR TWICE A DAY AS INSTRUCTED   gabapentin 100 MG capsule Commonly known as: NEURONTIN Take 1 capsule (100 mg total) by mouth 3 (three) times daily. What changed:   when to take this  reasons to take this   Jardiance 25 MG Tabs tablet Generic drug: empagliflozin Take 12.5 mg by mouth daily.   losartan 100 MG tablet Commonly known as: COZAAR Take 1 tablet (100 mg total) by mouth daily.   metFORMIN 1000 MG tablet Commonly known as: GLUCOPHAGE Take 1,000 mg by mouth daily with breakfast.   metoprolol succinate 100 MG 24 hr tablet Commonly known as: Toprol XL Take 1 tablet (100 mg total) by mouth daily.   multivitamin with minerals Tabs tablet Take 1 tablet by mouth daily.   Proventil HFA 108 (90 Base) MCG/ACT inhaler Generic drug: albuterol Inhale 2 puffs into the lungs every 6 (six) hours as needed for wheezing or shortness of breath.   simvastatin 20 MG tablet Commonly known as: ZOCOR Take 20 mg by mouth at bedtime.   Stiolto Respimat 2.5-2.5 MCG/ACT Aers Generic drug: Tiotropium Bromide-Olodaterol Inhale 2 puffs into the lungs daily as needed  (for flares).   triamterene-hydrochlorothiazide 37.5-25 MG tablet Commonly known as: MAXZIDE-25 Take 1 tablet by mouth daily.   Trulicity 1.5 IZ/1.2WP Sopn Generic drug: Dulaglutide INJECT 1.5 MG INTO THE SKIN WEEKLY What changed: See the new instructions.  Allergies: No Known Allergies  Past Medical History:  Diagnosis Date  . CAD (coronary artery disease)    Two RCA stents remotely / 3rd RCA stent 2006  . Colon polyps    s/p several Cscopes.  Marland Kitchen COPD (chronic obstructive pulmonary disease) (HCC)    on O2, nocturnal  . Diabetes mellitus    dx aprox 2009  . ED (erectile dysfunction)    has a vacumm device  . Ejection fraction   . Hyperlipidemia    dx in 90s  . Hypertension    dx in the 90  . Hypogonadism male   . PVD (peripheral vascular disease) ([place])    s/p stents at LE 2009, Dr Einar Gip  . Shortness of breath    O2 Sat dropped to 82% walking on the treadmill, September, 2012    Past Surgical History:  Procedure Laterality Date  . APPENDECTOMY    . TONSILLECTOMY      Family History  Problem Relation Age of Onset  . Heart disease Father   . Hypertension Father   . Stroke Father   . Diabetes Paternal Aunt   . Diabetes Maternal Grandmother   . Diabetes Other        GM, nephews, many family members  . Hyperlipidemia Other        ?  . Prostate cancer Brother   . Colon cancer Neg Hx     Social History:  reports that he quit smoking about 41 years ago. His smoking use included cigarettes. He has a 60.00 pack-year smoking history. He has never used smokeless tobacco. He reports current alcohol use. He reports that he does not use drugs.    Review of Systems        Lipids: He has been on treatment with simvastatin 20 mg with good control as follows       Lab Results  Component Value Date   CHOL 110 06/12/2018   HDL 35.20 (L) 06/12/2018   LDLCALC 58 06/12/2018   LDLDIRECT 55.0 01/01/2018   TRIG 85.0 06/12/2018   CHOLHDL 3 06/12/2018                    HYPOGONADISM He had decreased libido and erectile dysfunction previously and was found to have hypogonadism, probably in 2011. He did have a slightly low free testosterone level in 2012 on treatment Prolactin level normal  Previously on AndroGel With his hemoglobin going up to 19.1 in August 2019 he has been told not to take any testosterone  He does feel more tired but he thinks this is from his busy routine Testosterone level is just below normal   Lab Results  Component Value Date   TESTOSTERONE 280.49 (L) 03/31/2019    He has been evaluated by hematologist for his erythrocytosis His hemoglobin is finally back to normal without any intervention  CBC Latest Ref Rng & Units 03/05/2019 11/18/2018 08/29/2018  WBC 4.0 - 10.5 K/uL 9.2 8.4 7.3  Hemoglobin 13.0 - 17.0 g/dL 16.9 17.9(H) 18.0(HH)  Hematocrit 39.0 - 52.0 % 50.5 53.2(H) 53.5(H)  Platelets 150 - 400 K/uL 198 181 205.0          HYPERTENSION: This is followed by PCP and is on 100mg  of losartan along with half Maxide, amlodipine and metoprolol  He has been checking blood pressure at home and this is in the 353G systolic  BP Readings from Last 3 Encounters:  04/02/19 126/78  03/13/19 122/63  03/05/19 122/75   Lab Results  Component Value Date   CREATININE 1.41 03/31/2019   CREATININE 1.61 (H) 03/05/2019   CREATININE 1.33 01/26/2019     LABS:  Lab on 03/31/2019  Component Date Value Ref Range Status  . Testosterone 03/31/2019 280.49* 300.00 - 890.00 ng/dL Final  . Sodium 03/31/2019 140  135 - 145 mEq/L Final  . Potassium 03/31/2019 3.9  3.5 - 5.1 mEq/L Final  . Chloride 03/31/2019 107  96 - 112 mEq/L Final  . CO2 03/31/2019 23  19 - 32 mEq/L Final  . Glucose, Bld 03/31/2019 134* 70 - 99 mg/dL Final  . BUN 03/31/2019 19  6 - 23 mg/dL Final  . Creatinine, Ser 03/31/2019 1.41  0.40 - 1.50 mg/dL Final  . Calcium 03/31/2019 9.2  8.4 - 10.5 mg/dL Final  . GFR 03/31/2019 58.36* >60.00 mL/min Final  . Hgb A1c  MFr Bld 03/31/2019 7.1* 4.6 - 6.5 % Final   Glycemic Control Guidelines for People with Diabetes:Non Diabetic:  <6%Goal of Therapy: <7%Additional Action Suggested:  >8%     Physical Examination:  BP 126/78 (BP Location: Left Arm, Patient Position: Sitting, Cuff Size: Normal)   Pulse 80   Temp 97.6 F (36.4 C) (Oral)   Ht 6' (1.829 m)   Wt 238 lb 3.2 oz (108 kg)   SpO2 93%   BMI 32.31 kg/m    No pedal edema    ASSESSMENT/PLAN:   Diabetes type 2, with mild obesity, non-insulin-dependent  His A1c is 7.1  Fasting readings are mildly increased but not consistent He is not showing much improvement with increasing Jardiance to 25 mg with his A1c but this may be related to his creatinine being high normal now He has not done any formal exercise but is not gaining weight  HYPERTENSION: Well-controlled  Borderline renal function: Likely to be from nephrosclerosis  ERYTHROCYTOSIS  His hemoglobin is back to normal  Hypogonadism, history of: His testosterone level is 280 He has nonspecific fatigue but not clear if this is related to hypogonadism Considering his age and difficulties with erythrocytosis previously will not restart treatment    There are no Patient Instructions on file for this visit.    Elayne Snare 04/02/2019, 10:09 AM   Note: This office note was prepared with Dragon voice recognition system technology. Any transcriptional errors that result from this process are unintentional.

## 2019-04-03 ENCOUNTER — Ambulatory Visit: Payer: Medicare Other | Admitting: Endocrinology

## 2019-04-06 ENCOUNTER — Telehealth: Payer: Self-pay | Admitting: Cardiology

## 2019-04-06 NOTE — Telephone Encounter (Signed)

## 2019-04-07 ENCOUNTER — Other Ambulatory Visit: Payer: Self-pay

## 2019-04-07 ENCOUNTER — Encounter: Payer: Self-pay | Admitting: Cardiology

## 2019-04-07 ENCOUNTER — Ambulatory Visit (INDEPENDENT_AMBULATORY_CARE_PROVIDER_SITE_OTHER): Payer: Medicare Other | Admitting: Cardiology

## 2019-04-07 VITALS — BP 120/78 | HR 84 | Ht 72.0 in | Wt 238.8 lb

## 2019-04-07 DIAGNOSIS — I2510 Atherosclerotic heart disease of native coronary artery without angina pectoris: Secondary | ICD-10-CM | POA: Diagnosis not present

## 2019-04-07 DIAGNOSIS — I739 Peripheral vascular disease, unspecified: Secondary | ICD-10-CM

## 2019-04-07 NOTE — Patient Instructions (Signed)
Medication Instructions:  none If you need a refill on your cardiac medications before your next appointment, please call your pharmacy.   Lab work: none If you have labs (blood work) drawn today and your tests are completely normal, you will receive your results only by: Marland Kitchen MyChart Message (if you have MyChart) OR . A paper copy in the mail If you have any lab test that is abnormal or we need to change your treatment, we will call you to review the results.  Testing/Procedures: Your physician has requested that you have a lower extremity arterial duplex. This test is an ultrasound of the arteries in the legs or arms. It looks at arterial blood flow in the legs and arms. Allow one hour for Lower and Upper Arterial scans. There are no restrictions or special instructions   Follow-Up: At [facility], you and your health needs are our priority.  As part of our continuing mission to provide you with exceptional heart care, we have created designated Provider Care Teams.  These Care Teams include your primary Cardiologist (physician) and Advanced Practice Providers (APPs -  Physician Assistants and Nurse Practitioners) who all work together to provide you with the care you need, when you need it. You will need a follow up appointment in 1 years.  Please call our office 2 months in advance to schedule this appointment.  You may see Ena Dawley, MD or one of the following Advanced Practice Providers on your designated Care Team:   [place], PA-C Melina Copa, PA-C . Ermalinda Barrios, PA-C  Any Other Special Instructions Will Be Listed Below (If Applicable).

## 2019-04-07 NOTE — Progress Notes (Signed)
04/07/2019 [name]   1936-07-23  [id number]  Primary Physician Colon Branch, MD Primary Cardiologist: Ena Dawley, MD  Electrophysiologist: None   Reason for Visit/CC: F/u for CAD and PVD  HPI:  Mr. Hughlett presents to clinic today for his annual f/u. He has h/o CAD status post stenting x2 to the RCA in the remote past and a third RCA stent in 2006, negative stress test 2012, LVEF 55% on echo in 2015.  History of hypertension, HLD, diabetes mellitus, COPD on home O2, PVD status post stents of lower extremities in 2009. Was on Plavix previously, but this was discontinued in the setting of a GIB. He is a former pt of Dr. Ron Parker. Now followed by Dr. Meda Coffee.   He was seen in the [facility] ED last month with atypical CP c/w GI etiology. Troponin was negative x 2. BNP normal. EKG showed NSR. 80 bmp. No ischemic abnormalities. Symptoms resolved w/ Pepcid. No further recurrence.   Today in f/u, he reports that he has done well. No recurrent CP. He lives at home w/ his wife who has dementia. He is her primary caregiver and does all house hold chores and yardwork. No exertional CP or dyspnea with these activities. He has known PVD and had stenting done in the past (details unknown). He reports his toes are always very cold. No longer smokes. Quit over 20 years ago. Fully compliant with meds. BP is well controlled. His endocrinologist follows his lipids. His last FLP showed controlled LDL at 58 mg/dL. Recent Hgb A1c was 7.1. He is on statin therapy.   Current Meds  Medication Sig  . albuterol (PROVENTIL HFA) 108 (90 Base) MCG/ACT inhaler Inhale 2 puffs into the lungs every 6 (six) hours as needed for wheezing or shortness of breath.  Marland Kitchen amLODipine (NORVASC) 10 MG tablet TAKE 1 TABLET DAILY  . aspirin 81 MG tablet Take 81 mg by mouth daily.    . Blood Glucose Monitoring Suppl (FREESTYLE LITE) DEVI 1 each by Does not apply route daily. Use as instructed to check blood sugar twice daily.  DX: E11.9   . cyclobenzaprine (FLEXERIL) 10 MG tablet Take 1 tablet (10 mg total) by mouth at bedtime as needed for muscle spasms.  . empagliflozin (JARDIANCE) 25 MG TABS tablet Take 12.5 mg by mouth daily.  . famotidine (PEPCID) 20 MG tablet Take 1 tablet (20 mg total) by mouth 2 (two) times daily.  Marland Kitchen gabapentin (NEURONTIN) 100 MG capsule Take 1 capsule (100 mg total) by mouth 3 (three) times daily.  Marland Kitchen glucose blood (FREESTYLE LITE) test strip USE TO CHECK BLOOD SUGAR TWICE A DAY AS INSTRUCTED  . Lancets (FREESTYLE) lancets USE AS INSTRUCTED TO CHECK BLOOD SUGAR TWICE A DAY  . losartan (COZAAR) 100 MG tablet Take 1 tablet (100 mg total) by mouth daily.  . metFORMIN (GLUCOPHAGE) 1000 MG tablet Take 1,000 mg by mouth daily with breakfast.   . metoprolol succinate (TOPROL XL) 100 MG 24 hr tablet Take 1 tablet (100 mg total) by mouth daily.  . Multiple Vitamin (MULTIVITAMIN WITH MINERALS) TABS tablet Take 1 tablet by mouth daily.  . simvastatin (ZOCOR) 20 MG tablet Take 20 mg by mouth at bedtime.   . Tiotropium Bromide-Olodaterol (STIOLTO RESPIMAT) 2.5-2.5 MCG/ACT AERS Inhale 2 puffs into the lungs daily as needed (for flares).   . triamterene-hydrochlorothiazide (MAXZIDE-25) 37.5-25 MG tablet Take 1 tablet by mouth daily.   . TRULICITY 1.5 CL/2.7NT SOPN INJECT 1.5 MG INTO THE SKIN  WEEKLY   No Known Allergies Past Medical History:  Diagnosis Date  . CAD (coronary artery disease)    Two RCA stents remotely / 3rd RCA stent 2006  . Colon polyps    s/p several Cscopes.  Marland Kitchen COPD (chronic obstructive pulmonary disease) (HCC)    on O2, nocturnal  . Diabetes mellitus    dx aprox 2009  . ED (erectile dysfunction)    has a vacumm device  . Ejection fraction   . Hyperlipidemia    dx in 90s  . Hypertension    dx in the 90  . Hypogonadism male   . PVD (peripheral vascular disease) ([place])    s/p stents at LE 2009, Dr Einar Gip  . Shortness of breath    O2 Sat dropped to 82% walking on the treadmill, September,  2012   Family History  Problem Relation Age of Onset  . Heart disease Father   . Hypertension Father   . Stroke Father   . Diabetes Paternal Aunt   . Diabetes Maternal Grandmother   . Diabetes Other        GM, nephews, many family members  . Hyperlipidemia Other        ?  . Prostate cancer Brother   . Colon cancer Neg Hx    Past Surgical History:  Procedure Laterality Date  . APPENDECTOMY    . TONSILLECTOMY     Social History   Socioeconomic History  . Marital status: Married    Spouse name: Cerrella   . Number of children: 6  . Years of education: Not on file  . Highest education level: Not on file  Occupational History  . 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 Financial resource strain: Not on file  . Food insecurity    Worry: Not on file    Inability: Not on file  . Transportation needs    Medical: Not on file    Non-medical: Not on file  Tobacco Use  . Smoking status: Former Smoker    Packs/day: 2.00    Years: 30.00    Pack years: 60.00    Types: Cigarettes    Quit date: 06/17/1977    Years since quitting: 41.8  . Smokeless tobacco: Never Used  . Tobacco comment: 2 ppd, quit 1987  Substance and Sexual Activity  . Alcohol use: Yes    Alcohol/week: 0.0 standard drinks    Comment: socially   . Drug use: No  . Sexual activity: Yes  Lifestyle  . Physical activity    Days per week: Not on file    Minutes per session: Not on file  . Stress: Not on file  Relationships  . Social Herbalist on phone: Not on file    Gets together: Not on file    Attends religious service: Not on file    Active member of club or organization: Not on file    Attends meetings of clubs or organizations: Not on file    Relationship status: Not on file  . Intimate partner violence    Fear of current or ex partner: Not on file    Emotionally abused: Not on file    Physically abused: Not on file    Forced sexual activity: Not  on file  Other Topics Concern  . Not on file  Social History Narrative   4 children (lost 1 son)   Wife has 2 children  Lipid Panel     Component Value Date/Time   CHOL 110 06/12/2018 0812   TRIG 85.0 06/12/2018 0812   HDL 35.20 (L) 06/12/2018 0812   CHOLHDL 3 06/12/2018 0812   VLDL 17.0 06/12/2018 0812   LDLCALC 58 06/12/2018 0812   LDLDIRECT 55.0 01/01/2018 0808    Review of Systems: General: negative for chills, fever, night sweats or weight changes.  Cardiovascular: negative for chest pain, dyspnea on exertion, edema, orthopnea, palpitations, paroxysmal nocturnal dyspnea or shortness of breath Dermatological: negative for rash Respiratory: negative for cough or wheezing Urologic: negative for hematuria Abdominal: negative for nausea, vomiting, diarrhea, bright red blood per rectum, melena, or hematemesis Neurologic: negative for visual changes, syncope, or dizziness All other systems reviewed and are otherwise negative except as noted above.   Physical Exam:  Blood pressure 120/78, pulse 84, height 6' (1.829 m), weight 238 lb 12.8 oz (108.3 kg), SpO2 95 %.  General appearance: alert, cooperative and no distress Neck: no carotid bruit and no JVD Lungs: clear to auscultation bilaterally Heart: regular rate and rhythm, S1, S2 normal, no murmur, click, rub or gallop Extremities: extremities normal, atraumatic, no cyanosis or edema Pulses: 2+ and symmetric Skin: Skin color, texture, turgor normal. No rashes or lesions Neurologic: Grossly normal  EKG EKG from 02/2019 reviewed. NSR 80 bpm. No ischemic abnormalties.  -- personally reviewed   ASSESSMENT AND PLAN:   1. CAD: h/o RCA stents. Last intervention was in 2006. Stress test in 2012 no ischemia. He denies any recent anginal symptomatology. Continue medical therapy. On ASA, statin and BB. No longer on Plavix due to h/o GIB.   2. HTN: controlled on current regimen. No changes made today.   3. HLD:  controlled on simvastatin. Followed by endocrinologist, Dr. Dwyane Dee. Last lipid panel showed controlled LDL at 58 mg/dL.  Goal is < 70.   4. DM: controlled on current regimen. Recent Hgb A1c was 7.1   5. PVD: h/o LE arterial stents. Details unknown. Cannot locate report in chart. He complains of constantly cold distal extremities. Weak pulses no exam. Will order LE arterial dopplers and will refer to Dr. Gwenlyn Found if abnormal.    Follow-Up w/ Dr. Meda Coffee in 1 year. If LE arterial dopplers show ischemia, we will then refer to Dr. Gwenlyn Found.   Brittainy Ladoris Gene, MHS [facility] 04/07/2019 10:30 AM

## 2019-04-08 ENCOUNTER — Other Ambulatory Visit: Payer: Self-pay | Admitting: Cardiology

## 2019-04-08 ENCOUNTER — Encounter: Payer: Self-pay | Admitting: Internal Medicine

## 2019-04-08 ENCOUNTER — Ambulatory Visit (INDEPENDENT_AMBULATORY_CARE_PROVIDER_SITE_OTHER): Payer: Medicare Other | Admitting: Internal Medicine

## 2019-04-08 VITALS — BP 107/63 | HR 85 | Temp 97.7°F | Resp 16 | Ht 72.0 in | Wt 236.2 lb

## 2019-04-08 DIAGNOSIS — E782 Mixed hyperlipidemia: Secondary | ICD-10-CM | POA: Diagnosis not present

## 2019-04-08 DIAGNOSIS — E1159 Type 2 diabetes mellitus with other circulatory complications: Secondary | ICD-10-CM

## 2019-04-08 DIAGNOSIS — I10 Essential (primary) hypertension: Secondary | ICD-10-CM | POA: Diagnosis not present

## 2019-04-08 DIAGNOSIS — R195 Other fecal abnormalities: Secondary | ICD-10-CM

## 2019-04-08 DIAGNOSIS — I2510 Atherosclerotic heart disease of native coronary artery without angina pectoris: Secondary | ICD-10-CM

## 2019-04-08 DIAGNOSIS — I739 Peripheral vascular disease, unspecified: Secondary | ICD-10-CM

## 2019-04-08 LAB — LIPID PANEL
Cholesterol: 109 mg/dL (ref 0–200)
HDL: 36.2 mg/dL — ABNORMAL LOW (ref >39.00)
LDL Cholesterol: 51 mg/dL (ref 0–99)
NonHDL: 73.01
Total CHOL/HDL Ratio: 3
Triglycerides: 110 mg/dL (ref 0.0–149.0)
VLDL: 22 mg/dL (ref 0.0–40.0)

## 2019-04-08 LAB — CBC WITH DIFFERENTIAL/PLATELET
Basophils Absolute: 0.1 10*3/uL (ref 0.0–0.1)
Basophils Relative: 0.7 % (ref 0.0–3.0)
Eosinophils Absolute: 0.3 10*3/uL (ref 0.0–0.7)
Eosinophils Relative: 2.5 % (ref 0.0–5.0)
HCT: 53.4 % — ABNORMAL HIGH (ref 39.0–52.0)
Hemoglobin: 17.9 g/dL — ABNORMAL HIGH (ref 13.0–17.0)
Lymphocytes Relative: 23.1 % (ref 12.0–46.0)
Lymphs Abs: 2.4 10*3/uL (ref 0.7–4.0)
MCHC: 33.5 g/dL (ref 30.0–36.0)
MCV: 98.8 fl (ref 78.0–100.0)
Monocytes Absolute: 1.2 10*3/uL — ABNORMAL HIGH (ref 0.1–1.0)
Monocytes Relative: 11.5 % (ref 3.0–12.0)
Neutro Abs: 6.6 10*3/uL (ref 1.4–7.7)
Neutrophils Relative %: 62.2 % (ref 43.0–77.0)
Platelets: 194 10*3/uL (ref 150.0–400.0)
RBC: 5.41 Mil/uL (ref 4.22–5.81)
RDW: 14.8 % (ref 11.5–15.5)
WBC: 10.5 10*3/uL (ref 4.0–10.5)

## 2019-04-08 NOTE — Patient Instructions (Addendum)
Per our records you are due for an eye exam. Please contact your eye doctor to schedule an appointment. Please have them send copies of your office visit notes to [place]. Our fax number is [phone].  Please schedule Medicare Wellness with Glenard Haring.   GO TO THE LAB : Get the blood work     GO TO THE FRONT DESK Schedule your next appointment for checkup in 4 to 6 months  Get a flu shot as soon as it is available

## 2019-04-08 NOTE — Progress Notes (Signed)
Pre visit review using our clinic review tool, if applicable. No additional management support is needed unless otherwise documented below in the visit note. 

## 2019-04-08 NOTE — Progress Notes (Signed)
Subjective:    Patient ID: Jonathan Leather., male    DOB: Oct 10, 1935, 83 y.o.   MRN: [id number]  DOS:  04/08/2019 Type of visit - description: Routine office visit No major concerns Since the last visit he talk with GI regards the positive Hemoccult. He also saw cardiology, note reviewed   Review of Systems Denies fever chills No chest pain no difficulty breathing No nausea, vomiting.  No change in the color of the stools  Past Medical History:  Diagnosis Date  . CAD (coronary artery disease)    Two RCA stents remotely / 3rd RCA stent 2006  . Colon polyps    s/p several Cscopes.  Marland Kitchen COPD (chronic obstructive pulmonary disease) (HCC)    on O2, nocturnal  . Diabetes mellitus    dx aprox 2009  . ED (erectile dysfunction)    has a vacumm device  . Ejection fraction   . Hyperlipidemia    dx in 90s  . Hypertension    dx in the 90  . Hypogonadism male   . PVD (peripheral vascular disease) ([place])    s/p stents at LE 2009, Dr Einar Gip  . Shortness of breath    O2 Sat dropped to 82% walking on the treadmill, September, 2012    Past Surgical History:  Procedure Laterality Date  . APPENDECTOMY    . TONSILLECTOMY      Social History   Socioeconomic History  . Marital status: Married    Spouse name: Cerrella   . Number of children: 6  . Years of education: Not on file  . Highest education level: Not on file  Occupational History  . 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 Financial resource strain: Not on file  . Food insecurity    Worry: Not on file    Inability: Not on file  . Transportation needs    Medical: Not on file    Non-medical: Not on file  Tobacco Use  . Smoking status: Former Smoker    Packs/day: 2.00    Years: 30.00    Pack years: 60.00    Types: Cigarettes    Quit date: 06/17/1977    Years since quitting: 41.8  . Smokeless tobacco: Never Used  . Tobacco comment: 2 ppd, quit 1987  Substance and Sexual  Activity  . Alcohol use: Yes    Alcohol/week: 0.0 standard drinks    Comment: socially   . Drug use: No  . Sexual activity: Yes  Lifestyle  . Physical activity    Days per week: Not on file    Minutes per session: Not on file  . Stress: Not on file  Relationships  . Social Herbalist on phone: Not on file    Gets together: Not on file    Attends religious service: Not on file    Active member of club or organization: Not on file    Attends meetings of clubs or organizations: Not on file    Relationship status: Not on file  . Intimate partner violence    Fear of current or ex partner: Not on file    Emotionally abused: Not on file    Physically abused: Not on file    Forced sexual activity: Not on file  Other Topics Concern  . Not on file  Social History Narrative   4 children (lost 1 son)   Wife has 2 children  Allergies as of 04/08/2019   No Known Allergies     Medication List       Accurate as of April 08, 2019 11:59 PM. If you have any questions, ask your nurse or doctor.        amLODipine 10 MG tablet Commonly known as: NORVASC TAKE 1 TABLET DAILY   aspirin 81 MG tablet Take 81 mg by mouth daily.   cyclobenzaprine 10 MG tablet Commonly known as: FLEXERIL Take 1 tablet (10 mg total) by mouth at bedtime as needed for muscle spasms.   famotidine 20 MG tablet Commonly known as: Pepcid Take 1 tablet (20 mg total) by mouth 2 (two) times daily.   freestyle lancets USE AS INSTRUCTED TO CHECK BLOOD SUGAR TWICE A DAY   FreeStyle Lite Devi 1 each by Does not apply route daily. Use as instructed to check blood sugar twice daily.  DX: E11.9   FREESTYLE LITE test strip Generic drug: glucose blood USE TO CHECK BLOOD SUGAR TWICE A DAY AS INSTRUCTED   gabapentin 100 MG capsule Commonly known as: NEURONTIN Take 1 capsule (100 mg total) by mouth 3 (three) times daily.   Jardiance 25 MG Tabs tablet Generic drug: empagliflozin  Take 12.5 mg by mouth daily.   losartan 100 MG tablet Commonly known as: COZAAR Take 1 tablet (100 mg total) by mouth daily.   metFORMIN 1000 MG tablet Commonly known as: GLUCOPHAGE Take 1,000 mg by mouth daily with breakfast.   metoprolol succinate 100 MG 24 hr tablet Commonly known as: Toprol XL Take 1 tablet (100 mg total) by mouth daily.   multivitamin with minerals Tabs tablet Take 1 tablet by mouth daily.   Proventil HFA 108 (90 Base) MCG/ACT inhaler Generic drug: albuterol Inhale 2 puffs into the lungs every 6 (six) hours as needed for wheezing or shortness of breath.   simvastatin 20 MG tablet Commonly known as: ZOCOR Take 20 mg by mouth at bedtime.   Stiolto Respimat 2.5-2.5 MCG/ACT Aers Generic drug: Tiotropium Bromide-Olodaterol Inhale 2 puffs into the lungs daily as needed (for flares).   triamterene-hydrochlorothiazide 37.5-25 MG tablet Commonly known as: MAXZIDE-25 Take 1 tablet by mouth daily.   Trulicity 1.5 OI/7.1IW Sopn Generic drug: Dulaglutide INJECT 1.5 MG INTO THE SKIN WEEKLY           Objective:   Physical Exam BP 107/63 (BP Location: Left Arm, Patient Position: Sitting, Cuff Size: Small)   Pulse 85   Temp 97.7 F (36.5 C) (Oral)   Resp 16   Ht 6' (1.829 m)   Wt 236 lb 4 oz (107.2 kg)   SpO2 94%   BMI 32.04 kg/m  General:   Well developed, NAD, BMI noted.  HEENT:  Normocephalic . Face symmetric, atraumatic Lungs:  CTA B Normal respiratory effort, no intercostal retractions, no accessory muscle use. Heart: RRR,  no murmur.  no pretibial edema bilaterally  Abdomen:  Not distended, soft, non-tender. No rebound or rigidity.   Skin: Not pale. Not jaundice Neurologic:  alert & oriented X3.  Speech normal, gait appropriate for age and unassisted Psych--  Cognition and judgment appear intact.  Cooperative with normal attention span and concentration.  Behavior appropriate. No anxious or depressed appearing.     Assessment      Assessment DM dx ~5809, complicated by CAD, PVD, mild neuropathy. Sees Dr. Dwyane Dee Hypogonadism. Sees Dr. Dwyane Dee HTN Hyperlipidemia COPD, nocturnal O2 prn Back pain, chronic: See visit 10/12/2016 CV: -CAD, Dr. Ron Parker Dr Meda Coffee S/p  stenting 2 to the RCA remote past and the third RCA stent in  2006, negative stress test in 2012, LVEF 55% on echo 2015 -Peripheral vascular disease, stents lower extremity 2009  ED Polycythemia: Saw hematology 11-2018, felt to be due to chronic respiratory failure.  Not likely polycythemia vera  PLAN    Chest pain: Resolved + Hemoccult: The patient decided to consult Dr. Collene Mares, GI for that issue, pt is quite reluctant to proceed with any endoscopy.  They are doing a set of 3 Hemoccults and will decide later what is the next step.  Check a CBC. HTN: Seems well controlled on amlodipine, losartan, Maxide.  Ambulatory BPs normal, no change. High cholesterol: On simvastatin, checking a FLP CAD, PVD: Saw cardiology yesterday, felt to be stable, he reported "cold feet".  They RX LE arterial Dopplers. Preventive care: Recommend a flu shot as soon as available. RTC 4 to 6 months

## 2019-04-09 LAB — HEMOCCULT GUIAC POC 1CARD (OFFICE)
Card #1 Date: 7202020
Card #2 Date: 7202020
Card #2 Fecal Occult Blod, POC: NEGATIVE
Card #3 Date: 7202020
Card #3 Fecal Occult Blood, POC: NEGATIVE
Fecal Occult Blood, POC: NEGATIVE

## 2019-04-09 NOTE — Assessment & Plan Note (Signed)
Chest pain: Resolved + Hemoccult: The patient decided to consult Dr. Collene Mares, GI for that issue, pt is quite reluctant to proceed with any endoscopy.  They are doing a set of 3 Hemoccults and will decide later what is the next step.  Check a CBC. HTN: Seems well controlled on amlodipine, losartan, Maxide.  Ambulatory BPs normal, no change. High cholesterol: On simvastatin, checking a FLP CAD, PVD: Saw cardiology yesterday, felt to be stable, he reported "cold feet".  They RX LE arterial Dopplers. Preventive care: Recommend a flu shot as soon as available. RTC 4 to 6 months

## 2019-04-13 ENCOUNTER — Other Ambulatory Visit: Payer: Self-pay

## 2019-04-13 ENCOUNTER — Encounter: Payer: Self-pay | Admitting: Internal Medicine

## 2019-04-13 ENCOUNTER — Ambulatory Visit (INDEPENDENT_AMBULATORY_CARE_PROVIDER_SITE_OTHER): Payer: Medicare Other | Admitting: Internal Medicine

## 2019-04-13 ENCOUNTER — Ambulatory Visit (INDEPENDENT_AMBULATORY_CARE_PROVIDER_SITE_OTHER): Payer: Medicare Other

## 2019-04-13 VITALS — BP 116/78 | HR 85 | Temp 97.8°F | Ht 72.0 in | Wt 239.0 lb

## 2019-04-13 DIAGNOSIS — J9611 Chronic respiratory failure with hypoxia: Secondary | ICD-10-CM

## 2019-04-13 DIAGNOSIS — J449 Chronic obstructive pulmonary disease, unspecified: Secondary | ICD-10-CM

## 2019-04-13 DIAGNOSIS — I2510 Atherosclerotic heart disease of native coronary artery without angina pectoris: Secondary | ICD-10-CM

## 2019-04-13 DIAGNOSIS — J9811 Atelectasis: Secondary | ICD-10-CM | POA: Diagnosis not present

## 2019-04-13 NOTE — Patient Instructions (Signed)
We can continue the Stiolto and albuterol inhalers  Order- CXR  Dx COPD mixed type, Chronic hypoxic respiratory failure  Please call if we can help

## 2019-04-13 NOTE — Progress Notes (Signed)
HPI male former smoker followed for COPD, chronic respiratory failure with hypoxia, complicated by CAD/MI/ Stents/ PAD, DM2, polycythemia PFT- 07/10/11-  Normal spirometry flows with small- airway response to bronchodilator, normal lung volumes and moderately reduced diffusion. FEV1/FVC 0.79, DLCO 58% 6MWT -06/18/12- 96%, 88%, 94% 449 M. Significant desaturation with exercise. Overnight Oximetry 02/07/17-room air-sustained desaturation less than or equal to 88% for over 7 hours ------------------------------------------------------------------------------------------------------------  08/11/2018-  83 year old male former smoker followed for COPD, chronic respiratory failure with hypoxia,, complicated by CAD/MI/ stents/ PAD, DM2, polycythemia O2 2 L/APS-sleep and exertion He missed his appointment for PFT today and is rescheduled. 4 month follow up for COPD. States his POC stopped working and [employer] picked up his machine over 2 months ago. Has not heard back from them.  Dr. Larose Kells, PCP, has evaluated for weight loss with consideration of "caregiver fatigue"-he takes care of demented wife at home. We are checking with [employer]- he says they picked up his POC for repair 2 months ago and never returned it.  Bevespi,     UTD flu vax Sleeps better with oxygen.  Dyspnea on exertion-hills and stairs.  Tries to walk some on treadmill..  Not using Bevespi much-discussed.  Denies wheeze or cough. CXR 07/29/2018- No evidence of active disease.  Stable compared to June 2018 exam. Walk Test-08/11/2018-  04/13/2019-  83 year old male former smoker followed for COPD, chronic respiratory failure with hypoxia,, complicated by CAD/MI/ stents/ PAD, DM2, polycythemia O2 2 L/ [facility]-sleep and exertion -----pt states breathing is at baseline; uses O2 2L qhs, DME: [employer]  POC 2l prn Denies edema cough or wheeze. ED for chest pain last month, relieved after acid blocker. Daily Stiolto. Rescue inhaler not more than once/ week.   Reviewed CXR suggesting fluid overload.  PFT 08/13/18- Diffusion moderately reduced. Normal spirometry flows and lung volumes CXR 03/05/19 1V-   IMPRESSION: Borderline cardiomegaly with volume overload.  ROS-see HPI  + = positive Constitutional:   No-   weight loss, night sweats, fevers, chills, fatigue, lassitude. HEENT:   No-  headaches, difficulty swallowing, tooth/dental problems, sore throat,       No-  sneezing, itching, ear ache, nasal congestion, post nasal drip,  CV:  No-   chest pain, orthopnea, PND, swelling in lower extremities, anasarca, dizziness, palpitations,                 ? Claudication R thigh Resp:   + "shortness of breath with exertion or at rest.              No-   productive cough,  No non-productive cough,  No- coughing up of blood.              No-   change in color of mucus.   wheezing.   Skin: No-   rash or lesions. GI:  No-   heartburn, indigestion, abdominal pain, nausea, vomiting, GU:  MS:  No-   joint pain or swelling.   Neuro-     nothing unusual Psych:  No- change in mood or affect. No depression or anxiety.  No memory loss.  OBJ- Physical Exam     General- Alert, Oriented, Affect-appropriate, Distress- none acute.  Skin- rash-none, lesions- none, excoriation- none Lymphadenopathy- ?  Small left supraclavicular node, soft, mobile Head- atraumatic            Eyes- Gross vision intact, PERRLA, conjunctivae and secretions clear            Ears- Hearing, canals-normal  Nose- Clear, no-Septal dev, mucus, polyps, erosion, perforation             Throat- Mallampati II , mucosa clear , drainage- none, tonsils- atrophic Neck- flexible , trachea midline, no stridor , thyroid nl, carotid no bruit Chest - symmetrical excursion , unlabored           Heart/CV- RRR , no murmur , no gallop  , no rub, nl s1 s2                           - JVD none , edema- none, stasis changes- none, varices- none           Lung- + few rales L base, cough-none ,  dullness-none, rub- none,            Chest wall-  Abd-  Br/ Gen/ Rectal- Not done, not indicated Extrem- cyanosis- none,  atrophy- none, strength- nl  Neuro- grossly intact to observation

## 2019-04-17 ENCOUNTER — Other Ambulatory Visit: Payer: Self-pay

## 2019-04-17 ENCOUNTER — Ambulatory Visit (HOSPITAL_COMMUNITY)
Admission: RE | Admit: 2019-04-17 | Discharge: 2019-04-17 | Disposition: A | Discharge status: Home / Self Care | Payer: Medicare Other | Source: Ambulatory Visit | Attending: Cardiology | Admitting: Cardiology

## 2019-04-17 DIAGNOSIS — I739 Peripheral vascular disease, unspecified: Secondary | ICD-10-CM | POA: Insufficient documentation

## 2019-04-22 ENCOUNTER — Telehealth: Payer: Self-pay

## 2019-04-22 NOTE — Telephone Encounter (Signed)
-----   Message from [facility], [place] sent at 04/22/2019  3:59 PM EDT ----- Mild- moderate plaque noted in arteries in legs but still getting good blood flow. Overall stable findings compared to last study. Recommend repeat LE arterial dopplers in 1 yr

## 2019-04-22 NOTE — Telephone Encounter (Signed)
Notes recorded by Frederik Schmidt, RN on 04/22/2019 at 4:41 PM EDT  The patient has been notified of the result and verbalized understanding. All questions (if any) were answered.  Frederik Schmidt, RN 04/22/2019 4:41 PM

## 2019-05-07 ENCOUNTER — Ambulatory Visit (INDEPENDENT_AMBULATORY_CARE_PROVIDER_SITE_OTHER): Payer: Medicare Other

## 2019-05-07 DIAGNOSIS — Z23 Encounter for immunization: Secondary | ICD-10-CM

## 2019-05-07 NOTE — Progress Notes (Signed)
Per orders of Dr. Dwyane Dee injection of HD flu vaccine given IM in L Deltoid today by A. Krystn Dermody, LPN . Denies any adverse reactions or discomfort.

## 2019-05-14 ENCOUNTER — Ambulatory Visit: Payer: Medicare Other | Admitting: Internal Medicine

## 2019-05-15 ENCOUNTER — Telehealth: Payer: Self-pay | Admitting: Cardiology

## 2019-05-15 NOTE — Telephone Encounter (Signed)
I spoke to the patient who called because he is still experiencing left leg cramping at night and when he walks.  It is mainly at night while sleeping, to a point where he has to get up and walk it off.  It is located above the knee on the lateral part of the leg.    He stated that he has been staying well hydrated, drinking plenty of water.  He called because he just wanted to let us know and wondered if we had advisement.

## 2019-05-15 NOTE — Telephone Encounter (Signed)
New Message   Patient is calling because he is experiencing cramps in his legs. He does not have any swelling in the leg. He states that he was advised that when he starts having leg pains again to call. Please call to discuss.

## 2019-05-18 NOTE — Telephone Encounter (Signed)
His LE arterial dopplers were normal (normal blood flow) and I have reviewed his labs. His potassium has historically been WNL. I do not think his symptoms are cardiovascular related. I recommend that he see his PCP for issue.

## 2019-05-19 NOTE — Telephone Encounter (Signed)
I spoke to the patient with Brittainy's recommendation.  He verbalized understanding.

## 2019-05-21 ENCOUNTER — Encounter: Payer: Self-pay | Admitting: Hematology

## 2019-05-21 ENCOUNTER — Inpatient Hospital Stay (HOSPITAL_BASED_OUTPATIENT_CLINIC_OR_DEPARTMENT_OTHER): Payer: Medicare Other | Admitting: Hematology

## 2019-05-21 ENCOUNTER — Inpatient Hospital Stay: Payer: Medicare Other | Attending: Hematology

## 2019-05-21 ENCOUNTER — Other Ambulatory Visit: Payer: Self-pay

## 2019-05-21 ENCOUNTER — Telehealth: Payer: Self-pay | Admitting: Hematology

## 2019-05-21 VITALS — BP 128/72 | HR 71 | Temp 98.1°F | Resp 17 | Wt 239.2 lb

## 2019-05-21 DIAGNOSIS — J449 Chronic obstructive pulmonary disease, unspecified: Secondary | ICD-10-CM

## 2019-05-21 DIAGNOSIS — D751 Secondary polycythemia: Secondary | ICD-10-CM

## 2019-05-21 DIAGNOSIS — J9611 Chronic respiratory failure with hypoxia: Secondary | ICD-10-CM

## 2019-05-21 DIAGNOSIS — N183 Chronic kidney disease, stage 3 (moderate): Secondary | ICD-10-CM | POA: Insufficient documentation

## 2019-05-21 DIAGNOSIS — Z79899 Other long term (current) drug therapy: Secondary | ICD-10-CM | POA: Insufficient documentation

## 2019-05-21 DIAGNOSIS — M79652 Pain in left thigh: Secondary | ICD-10-CM | POA: Insufficient documentation

## 2019-05-21 DIAGNOSIS — Z7984 Long term (current) use of oral hypoglycemic drugs: Secondary | ICD-10-CM | POA: Diagnosis not present

## 2019-05-21 DIAGNOSIS — I739 Peripheral vascular disease, unspecified: Secondary | ICD-10-CM | POA: Insufficient documentation

## 2019-05-21 LAB — CMP (CANCER CENTER ONLY)
ALT: 24 U/L (ref 0–44)
AST: 18 U/L (ref 15–41)
Albumin: 4.1 g/dL (ref 3.5–5.0)
Alkaline Phosphatase: 67 U/L (ref 38–126)
Anion gap: 9 (ref 5–15)
BUN: 18 mg/dL (ref 8–23)
CO2: 26 mmol/L (ref 22–32)
Calcium: 9.9 mg/dL (ref 8.9–10.3)
Chloride: 104 mmol/L (ref 98–111)
Creatinine: 1.4 mg/dL — ABNORMAL HIGH (ref 0.61–1.24)
GFR, Est AFR Am: 54 mL/min — ABNORMAL LOW (ref >60)
GFR, Estimated: 47 mL/min — ABNORMAL LOW (ref >60)
Glucose, Bld: 153 mg/dL — ABNORMAL HIGH (ref 70–99)
Potassium: 4 mmol/L (ref 3.5–5.1)
Sodium: 139 mmol/L (ref 135–145)
Total Bilirubin: 0.7 mg/dL (ref 0.3–1.2)
Total Protein: 7.3 g/dL (ref 6.5–8.1)

## 2019-05-21 LAB — CBC WITH DIFFERENTIAL (CANCER CENTER ONLY)
Abs Immature Granulocytes: 0.02 10*3/uL (ref 0.00–0.07)
Basophils Absolute: 0.1 10*3/uL (ref 0.0–0.1)
Basophils Relative: 1 %
Eosinophils Absolute: 0.3 10*3/uL (ref 0.0–0.5)
Eosinophils Relative: 3 %
HCT: 53.1 % — ABNORMAL HIGH (ref 39.0–52.0)
Hemoglobin: 18 g/dL — ABNORMAL HIGH (ref 13.0–17.0)
Immature Granulocytes: 0 %
Lymphocytes Relative: 37 %
Lymphs Abs: 3.1 10*3/uL (ref 0.7–4.0)
MCH: 32.8 pg (ref 26.0–34.0)
MCHC: 33.9 g/dL (ref 30.0–36.0)
MCV: 96.7 fL (ref 80.0–100.0)
Monocytes Absolute: 0.8 10*3/uL (ref 0.1–1.0)
Monocytes Relative: 9 %
Neutro Abs: 4.2 10*3/uL (ref 1.7–7.7)
Neutrophils Relative %: 50 %
Platelet Count: 181 10*3/uL (ref 150–400)
RBC: 5.49 MIL/uL (ref 4.22–5.81)
RDW: 13.8 % (ref 11.5–15.5)
WBC Count: 8.4 10*3/uL (ref 4.0–10.5)
nRBC: 0 % (ref 0.0–0.2)

## 2019-05-21 LAB — LACTATE DEHYDROGENASE: LDH: 176 U/L (ref 98–192)

## 2019-05-21 NOTE — Progress Notes (Signed)
[facility] OFF[facility] PROGRESS NOTE  Patient Care Team: Colon Branch, MD as PCP - General Jonathan Spark, MD as PCP - Cardiology (Cardiology) Jonathan Snare, MD as Consulting Physician (Endocrinology) Deneise Lever, MD as Consulting Physician (Pulmonary Disease) Jonathan Jacks, MD as Consulting Physician (Ophthalmology) Jonathan [name], DPM (Inactive) as Consulting Physician (Podiatry) Jonathan Spark, MD as Consulting Physician (Cardiology) Jonathan Clines, MD (Inactive) as Consulting Physician (Urology) Jonathan Craver, MD as Consulting Physician (Gastroenterology)  HEME/ONC OVERVIEW: 1. Chronic polycythemia -Hgb between 17 and 18 dating back to 2012   -Likely due to COPD and exogenous testosterone use  -Epo level 12.3 (c/w secondary polycythemia); iron profile normal   PERTINENT NON-HEM/ONC PROBLEMS 1. Chronic hypoxic respiratory failure on supplement O2 due to COPD  2. Hx of hypogonadism on testosterone cream for many years, stopped in late 2019   ASSESSMENT & PLAN:   Secondary polycythemia -Most likely secondary to chronic hypoxia from underlying lung disease  -Exogenous testosterone cream discontinued in 08/2018  -Hgb 18.0 today, stably elevated -Clinically, patient denies any symptoms associated with polycythemia, such as pruritus, rash, or any symptoms of VTE  -Due to the persistently elevated Hgb, I have ordered MPN NGS to rule out myeloproliferative disorder (less likely) -No indication for phlebotomy in the setting of secondary polycythemia, as phlebotomy can exacerbate underlying conditions, such as chronic respiratory failure  COPD with Chronic hypoxic respiratory failure  -FTC 07/2018 showed severe diffusion restriction minimal obstruction -O2 saturation 98% on RA today but review of pulmonary clinic notes showed desaturation during sleep  -Continue supplemental O2 at night and follow-up with Jonathan [name] pulmonology   Intermittent LE pain -Patient  reports intermittent left thigh pain, exacerbated by strenuous activity -Given his history of PVD s/p stent placement, this is most likely due to underlying peripheral vascular disease -No evidence of unilateral lower extremity swelling on exam today -Continue follow-up with PCP and cardiology  Stage III CKD -Likely secondary to DM II -Baseline Cr 1.3-1.4 -Cr 1.4 today, stable; electrolytes normal -Continue follow-up with PCP   Orders Placed This Encounter  Procedures  . JAK2 (INCLUDING V617F AND EXON 12), MPL,& CALR W/RFL MPN PANEL (NGS)    Standing Status:   Future    Number of Occurrences:   1    Standing Expiration Date:   05/20/2020  . CBC with Differential (Cancer Center Only)    Standing Status:   Future    Standing Expiration Date:   06/24/2020  . CMP ([facility] only)    Standing Status:   Future    Standing Expiration Date:   06/24/2020  . Lactate dehydrogenase    Standing Status:   Future    Standing Expiration Date:   06/24/2020  . Save Smear (SSMR)    Standing Status:   Future    Standing Expiration Date:   05/20/2020    All questions were answered. The patient knows to call the clinic with any problems, questions or concerns. No barriers to learning was detected.  Return in 6 months for labs and clinic follow-up.  Tish Men, MD 05/21/2019 10:31 AM  CHIEF COMPLAINT: "I am feeling fine"  INTERVAL HISTORY: Jonathan [name] returns to clinic for follow-up of secondary polycythemia due to chronic hypoxic respiratory failure.  Patient reports that he recently was found with positive Hemoccult at the [place], and was recommended to have colonoscopy.  However, he was presented to Jonathan [name] of gastroenterology at [facility], who repeated a stool testing that was  negative for blood.  She recommended against a colonoscopy unless the patient had clinical symptoms of GI bleeding due to his age.  He also reports that he has occasional left thigh pain, positional, exacerbated by strenuous  activity, which his cardiologist told him was due to his peripheral vascular disease.  He wears oxygen at night (2 L).  He denies any constitutional symptoms, chest pain, worsening dyspnea, or abnormal bleeding/bruising.  REVIEW OF SYSTEMS:   Constitutional: ( - ) fevers, ( - )  chills , ( - ) night sweats Eyes: ( - ) blurriness of vision, ( - ) double vision, ( - ) watery eyes Ears, nose, mouth, throat, and face: ( - ) mucositis, ( - ) sore throat Respiratory: ( - ) cough, ( - ) dyspnea, ( - ) wheezes Cardiovascular: ( - ) palpitation, ( - ) chest discomfort, ( - ) lower extremity swelling Gastrointestinal:  ( - ) nausea, ( - ) heartburn, ( - ) change in bowel habits Skin: ( - ) abnormal skin rashes Lymphatics: ( - ) new lymphadenopathy, ( - ) easy bruising Neurological: ( - ) numbness, ( - ) tingling, ( - ) new weaknesses Behavioral/Psych: ( - ) mood change, ( - ) new changes  All other systems were reviewed with the patient and are negative.  I have reviewed the past medical history, past surgical history, social history and family history with the patient and they are unchanged from previous note.  ALLERGIES:  has No Known Allergies.  MEDICATIONS:  Current Outpatient Medications  Medication Sig Dispense Refill  . albuterol (PROVENTIL HFA) 108 (90 Base) MCG/ACT inhaler Inhale 2 puffs into the lungs every 6 (six) hours as needed for wheezing or shortness of breath.    Marland Kitchen amLODipine (NORVASC) 10 MG tablet TAKE 1 TABLET DAILY 90 tablet 3  . aspirin 81 MG tablet Take 81 mg by mouth daily.      . Blood Glucose Monitoring Suppl (FREESTYLE LITE) DEVI 1 each by Does not apply route daily. Use as instructed to check blood sugar twice daily.  DX: E11.9 1 each each  . cyclobenzaprine (FLEXERIL) 10 MG tablet Take 1 tablet (10 mg total) by mouth at bedtime as needed for muscle spasms. 21 tablet 0  . empagliflozin (JARDIANCE) 25 MG TABS tablet Take 12.5 mg by mouth daily.    . famotidine (PEPCID)  20 MG tablet Take 1 tablet (20 mg total) by mouth 2 (two) times daily. 60 tablet 0  . gabapentin (NEURONTIN) 100 MG capsule Take 1 capsule (100 mg total) by mouth 3 (three) times daily. 90 capsule 1  . glucose blood (FREESTYLE LITE) test strip USE TO CHECK BLOOD SUGAR TWICE A DAY AS INSTRUCTED 100 each 6  . Lancets (FREESTYLE) lancets USE AS INSTRUCTED TO CHECK BLOOD SUGAR TWICE A DAY 200 each 1  . losartan (COZAAR) 100 MG tablet Take 1 tablet (100 mg total) by mouth daily. 90 tablet 1  . metFORMIN (GLUCOPHAGE) 1000 MG tablet Take 1,000 mg by mouth daily with breakfast.     . metoprolol succinate (TOPROL XL) 100 MG 24 hr tablet Take 1 tablet (100 mg total) by mouth daily. 90 tablet 2  . Multiple Vitamin (MULTIVITAMIN WITH MINERALS) TABS tablet Take 1 tablet by mouth daily.    . simvastatin (ZOCOR) 20 MG tablet Take 20 mg by mouth at bedtime.     . Tiotropium Bromide-Olodaterol (STIOLTO RESPIMAT) 2.5-2.5 MCG/ACT AERS Inhale 2 puffs into the lungs daily as needed (  for flares).     . triamterene-hydrochlorothiazide (MAXZIDE-25) 37.5-25 MG tablet Take 1 tablet by mouth daily.     . TRULICITY 1.5 0000000 SOPN INJECT 1.5 MG INTO THE SKIN WEEKLY 6 mL 4   No current facility-administered medications for this visit.     PHYSICAL EXAMINATION: ECOG PERFORMANCE STATUS: 1 - Symptomatic but completely ambulatory  Today's Vitals   05/21/19 1009  BP: 128/72  Pulse: 71  Resp: 17  Temp: 98.1 F (36.7 C)  TempSrc: Temporal  SpO2: 98%  Weight: 239 lb 3.2 oz (108.5 kg)   Body mass index is 32.44 kg/m.  Filed Weights   05/21/19 1009  Weight: 239 lb 3.2 oz (108.5 kg)    GENERAL: alert, no distress and comfortable, well appearing  SKIN: skin color, texture, turgor are normal, no rashes or significant lesions EYES: conjunctiva are pink and non-injected, sclera clear OROPHARYNX: no exudate, no erythema; lips, buccal mucosa, and tongue normal  NECK: supple, non-tender LUNGS: clear to auscultation  with normal breathing effort HEART: regular rate & rhythm and no murmurs and no lower extremity edema ABDOMEN: soft, non-tender, non-distended, normal bowel sounds Musculoskeletal: no cyanosis of digits and no clubbing  PSYCH: alert & oriented x 3, fluent speech NEURO: no focal motor/sensory deficits  LABORATORY DATA:  I have reviewed the data as listed    Component Value Date/Time   NA 139 05/21/2019 0919   K 4.0 05/21/2019 0919   CL 104 05/21/2019 0919   CO2 26 05/21/2019 0919   GLUCOSE 153 (H) 05/21/2019 0919   BUN 18 05/21/2019 0919   CREATININE 1.40 (H) 05/21/2019 0919   CALCIUM 9.9 05/21/2019 0919   PROT 7.3 05/21/2019 0919   ALBUMIN 4.1 05/21/2019 0919   AST 18 05/21/2019 0919   ALT 24 05/21/2019 0919   ALKPHOS 67 05/21/2019 0919   BILITOT 0.7 05/21/2019 0919   GFRNONAA 47 (L) 05/21/2019 0919   GFRAA 54 (L) 05/21/2019 0919    No results found for: SPEP, UPEP  Lab Results  Component Value Date   WBC 8.4 05/21/2019   NEUTROABS 4.2 05/21/2019   HGB 18.0 (H) 05/21/2019   HCT 53.1 (H) 05/21/2019   MCV 96.7 05/21/2019   PLT 181 05/21/2019      Chemistry      Component Value Date/Time   NA 139 05/21/2019 0919   K 4.0 05/21/2019 0919   CL 104 05/21/2019 0919   CO2 26 05/21/2019 0919   BUN 18 05/21/2019 0919   CREATININE 1.40 (H) 05/21/2019 0919      Component Value Date/Time   CALCIUM 9.9 05/21/2019 0919   ALKPHOS 67 05/21/2019 0919   AST 18 05/21/2019 0919   ALT 24 05/21/2019 0919   BILITOT 0.7 05/21/2019 0919       RADIOGRAPHIC STUDIES: I have personally reviewed the radiological images as listed below and agreed with the findings in the report. No results found.

## 2019-05-21 NOTE — Telephone Encounter (Signed)
Appointments scheduled patient notified per 9/3 los

## 2019-05-21 NOTE — Telephone Encounter (Signed)
Called and spoke with patient regarding appointments added per 9/3 los

## 2019-05-27 NOTE — Assessment & Plan Note (Signed)
With Stiolto maintenance he has littyle recent need for rescue albuterol.

## 2019-05-27 NOTE — Assessment & Plan Note (Signed)
He benefits from O2 2L, continuous

## 2019-05-27 NOTE — Assessment & Plan Note (Signed)
CXR indicates borderline fluid overload/ CHF. Discussed.  Plan- update CXR

## 2019-06-08 NOTE — Progress Notes (Signed)
Subjective:   Jonathan [name]. is a 83 y.o. male who presents for Medicare Annual/Subsequent preventive examination.  Review of Systems:  Cardiac Risk Factors include: advanced age (>13men, >29 women);dyslipidemia;diabetes mellitus;male gender Home Safety/Smoke Alarms: Feels safe in home. Smoke alarms in place.  Lives w/ wife in 2 story home. Sleeps w/ 2L of O2.  Eye- done yearly at [place].   Male:     PSA-  Lab Results  Component Value Date   PSA 0.53 12/05/2018   PSA 0.60 02/14/2018   PSA 0.56 04/02/2016       Objective:    Vitals: BP 122/68 (BP Location: Left Arm, Patient Position: Sitting, Cuff Size: Normal)    Pulse 70    Temp (!) 97 F (36.1 C) (Temporal)    Ht 6' (1.829 m)    Wt 238 lb 6.4 oz (108.1 kg)    SpO2 96%    BMI 32.33 kg/m   Body mass index is 32.33 kg/m.  Advanced Directives 06/09/2019 05/21/2019 11/18/2018 08/19/2018 06/06/2018 06/05/2017 02/26/2017  Does Patient Have a Medical Advance Directive? No No No No No Yes No  Type of Advance Directive - - - - - Press photographer;Living will -  Copy of [place] in Chart? - - - - - No - copy requested -  Would patient like information on creating a medical advance directive? No - Patient declined No - Patient declined No - Patient declined No - Patient declined Yes (MAU/Ambulatory/Procedural Areas - Information given) - -    Tobacco Social History   Tobacco Use  Smoking Status Former Smoker   Packs/day: 2.00   Years: 30.00   Pack years: 60.00   Types: Cigarettes   Quit date: 06/17/1977   Years since quitting: 42.0  Smokeless Tobacco Never Used  Tobacco Comment   2 ppd, quit 1987     Counseling given: Not Answered Comment: 2 ppd, quit 1987   Clinical Intake:     Pain : No/denies pain                 Past Medical History:  Diagnosis Date   CAD (coronary artery disease)    Two RCA stents remotely / 3rd RCA stent 2006   Colon polyps    s/p several Cscopes.    COPD (chronic obstructive pulmonary disease) (HCC)    on O2, nocturnal   Diabetes mellitus    dx aprox 2009   ED (erectile dysfunction)    has a vacumm device   Ejection fraction    Hyperlipidemia    dx in 90s   Hypertension    dx in the 18   Hypogonadism male    PVD (peripheral vascular disease) ([place])    s/p stents at [facility] 2009, Dr Einar Gip   Shortness of breath    O2 Sat dropped to 82% walking on the treadmill, September, 2012   Past Surgical History:  Procedure Laterality Date   APPENDECTOMY     TONSILLECTOMY     Family History  Problem Relation Age of Onset   Heart disease Father    Hypertension Father    Stroke Father    Diabetes Paternal Aunt    Diabetes Maternal Grandmother    Diabetes Other        GM, nephews, many family members   Hyperlipidemia Other        ?   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   Scientist, product/process development strain: Not on file   Food insecurity    Worry: Not on file    Inability: Not on Lexicographer needs    Medical: Not on file    Non-medical: Not on file  Tobacco Use   Smoking status: Former Smoker    Packs/day: 2.00    Years: 30.00    Pack years: 60.00    Types: Cigarettes    Quit date: 06/17/1977    Years since quitting: 42.0   Smokeless tobacco: Never Used   Tobacco comment: 2 ppd, quit 1987  Substance and Sexual Activity   Alcohol use: Yes    Alcohol/week: 0.0 standard drinks    Comment: socially    Drug use: No   Sexual activity: Yes  Lifestyle   Physical activity    Days per week: Not on file    Minutes per session: Not on file   Stress: Not on file  Relationships   Social  connections    Talks on phone: Not on file    Gets together: Not on file    Attends religious service: Not on file    Active member of club or organization: Not on file    Attends meetings of clubs or organizations: Not on file    Relationship status: Not on file  Other Topics Concern   Not on file  Social History Narrative   4 children (lost 1 son)   Wife has 2 children                 Outpatient Encounter Medications as of 06/09/2019  Medication Sig   albuterol (PROVENTIL HFA) 108 (90 Base) MCG/ACT inhaler Inhale 2 puffs into the lungs every 6 (six) hours as needed for wheezing or shortness of breath.   amLODipine (NORVASC) 10 MG tablet TAKE 1 TABLET DAILY   aspirin 81 MG tablet Take 81 mg by mouth daily.     Blood Glucose Monitoring Suppl (FREESTYLE LITE) DEVI 1 each by Does not apply route daily. Use as instructed to check blood sugar twice daily.  DX: E11.9   cyclobenzaprine (FLEXERIL) 10 MG tablet Take 1 tablet (10 mg total) by mouth at bedtime as needed for muscle spasms.   empagliflozin (JARDIANCE) 25 MG TABS tablet Take 12.5 mg by mouth daily.   gabapentin (NEURONTIN) 100 MG capsule Take 1 capsule (100 mg total) by mouth 3 (three) times daily.   glucose blood (FREESTYLE LITE) test strip USE TO CHECK BLOOD SUGAR TWICE A DAY AS INSTRUCTED   Lancets (FREESTYLE) lancets USE AS INSTRUCTED TO CHECK BLOOD SUGAR TWICE A DAY   losartan (COZAAR) 100 MG tablet Take 1 tablet (100 mg total) by mouth daily.   metFORMIN (GLUCOPHAGE) 1000 MG tablet Take 1,000 mg by mouth daily with breakfast.    metoprolol succinate (TOPROL XL) 100 MG 24 hr tablet Take 1 tablet (100 mg total) by mouth daily.   Multiple Vitamin (MULTIVITAMIN WITH MINERALS) TABS tablet Take 1 tablet by mouth daily.   simvastatin (ZOCOR) 20 MG tablet Take 20 mg by mouth at bedtime.    Tiotropium Bromide-Olodaterol (STIOLTO RESPIMAT) 2.5-2.5 MCG/ACT AERS  Inhale 2 puffs into the lungs daily as needed (for  flares).    triamterene-hydrochlorothiazide (MAXZIDE-25) 37.5-25 MG tablet Take 1 tablet by mouth daily.    TRULICITY 1.5 0000000 SOPN INJECT 1.5 MG INTO THE SKIN WEEKLY   famotidine (PEPCID) 20 MG tablet Take 1 tablet (20 mg total) by mouth 2 (two) times daily. (Patient not taking: Reported on 06/09/2019)   [DISCONTINUED] empagliflozin (JARDIANCE) 10 MG TABS tablet Take 10 mg by mouth daily.   [DISCONTINUED] glimepiride (AMARYL) 4 MG tablet Take 4 mg by mouth daily with breakfast. Take 1/2 tablet daily   [DISCONTINUED] glucose blood (FREESTYLE LITE) test strip Use as instructed to check blood sugar twice daily.   [DISCONTINUED] Glycopyrrolate-Formoterol (BEVESPI AEROSPHERE) 9-4.8 MCG/ACT AERO Inhale 2 puffs into the lungs 2 (two) times daily.   [DISCONTINUED] lidocaine (XYLOCAINE) 2 % jelly Apply 1 application topically 2 (two) times daily as needed. Massage twice daily to affected area (Patient not taking: Reported on 07/29/2018)   [DISCONTINUED] metoprolol succinate (TOPROL XL) 100 MG 24 hr tablet Take 1 tablet (100 mg total) by mouth daily.   [DISCONTINUED] Testosterone 20.25 MG/ACT (1.62%) GEL Place 4 application onto the skin daily. (Patient not taking: Reported on 07/29/2018)   [DISCONTINUED] TRULICITY 1.5 0000000 SOPN INJECT 1.5 MG INTO THE SKIN WEEKLY   No facility-administered encounter medications on file as of 06/09/2019.     Activities of Daily Living In your present state of health, do you have any difficulty performing the following activities: 06/09/2019  Hearing? N  Vision? N  Difficulty concentrating or making decisions? N  Walking or climbing stairs? N  Dressing or bathing? N  Doing errands, shopping? N  Preparing Food and eating ? N  Using the Toilet? N  In the past six months, have you accidently leaked urine? N  Do you have problems with loss of bowel control? N  Managing your Medications? N  Managing your Finances? N  Housekeeping or managing your  Housekeeping? N  Some recent data might be hidden    Patient Care Team: Colon Branch, MD as PCP - General Dorothy Spark, MD as PCP - Cardiology (Cardiology) Elayne Snare, MD as Consulting Physician (Endocrinology) Deneise Lever, MD as Consulting Physician (Pulmonary Disease) Clent Jacks, MD as Consulting Physician (Ophthalmology) Camelia Phenes, DPM (Inactive) as Consulting Physician (Podiatry) Dorothy Spark, MD as Consulting Physician (Cardiology) Carolan Clines, MD (Inactive) as Consulting Physician (Urology) Juanita Craver, MD as Consulting Physician (Gastroenterology)   Assessment:   This is a routine wellness examination for Sylvain. Physical assessment deferred to PCP.  Exercise Activities and Dietary recommendations Current Exercise Habits: The patient does not participate in regular exercise at present, Exercise limited by: None identified Diet (meal preparation, eat out, water intake, caffeinated beverages, dairy products, fruits and vegetables): in general, a "healthy" diet  , well balanced   Goals     Increase physical activity       Fall Risk Fall Risk  06/09/2019 06/06/2018 06/05/2017 10/08/2016 04/02/2016  Falls in the past year? 0 No No No No    Depression Screen PHQ 2/9 Scores 06/09/2019 06/06/2018 06/05/2017 10/08/2016  PHQ - 2 Score 0 0 0 0    Cognitive Function Ad8 score reviewed for issues:  Issues making decisions:no  Less interest in hobbies / activities:no  Repeats questions, stories (family complaining):no  Trouble using ordinary gadgets (microwave, computer, phone):no  Forgets the month or year: no  Mismanaging finances: no  Remembering appts:no  Daily problems  with thinking and/or memory:no Ad8 score is=0        Immunization History  Administered Date(s) Administered   Fluad Quad(high Dose 65+) 05/07/2019   Influenza Split 06/18/2012   Influenza Whole 05/02/2010, 06/18/2011   Influenza, High Dose Seasonal PF  06/19/2016, 06/05/2017, 06/06/2018   Influenza,inj,Quad PF,6+ Mos 06/04/2013, 06/10/2014, 07/06/2015   Pneumococcal Conjugate-13 01/18/2014   Pneumococcal Polysaccharide-23 06/21/2010, 10/17/2018   Td 09/17/2004   Tdap 11/18/2014   Zoster Recombinat (Shingrix) 10/21/2018, 04/06/2019   Screening Tests Health Maintenance  Topic Date Due   OPHTHALMOLOGY EXAM  08/17/2018   HEMOGLOBIN A1C  10/01/2019   FOOT EXAM  01/02/2020   COLONOSCOPY  12/05/2021   TETANUS/TDAP  11/17/2024   INFLUENZA VACCINE  Completed   PNA vac Low Risk Adult  Completed    Plan:   See you next year!  Continue to eat heart healthy diet (full of fruits, vegetables, whole grains, lean protein, water--limit salt, fat, and sugar intake) and increase physical activity as tolerated.  Continue doing brain stimulating activities (puzzles, reading, adult coloring books, staying active) to keep memory sharp.   Bring a copy of your living will and/or healthcare power of attorney to your next office visit.    I have personally reviewed and noted the following in the patients chart:    Medical and social history  Use of alcohol, tobacco or illicit drugs   Current medications and supplements  Functional ability and status  Nutritional status  Physical activity  Advanced directives  List of other physicians  Hospitalizations, surgeries, and ER visits in previous 12 months  Vitals  Screenings to include cognitive, depression, and falls  Referrals and appointments  In addition, I have reviewed and discussed with patient certain preventive protocols, quality metrics, and best practice recommendations. A written personalized care plan for preventive services as well as general preventive health recommendations were provided to patient.     Shela Nevin, [place]  06/09/2019  PCP note: pt reports he feels like his balance is slightly off sometimes. Denies any falls.

## 2019-06-09 ENCOUNTER — Ambulatory Visit (INDEPENDENT_AMBULATORY_CARE_PROVIDER_SITE_OTHER): Payer: Medicare Other | Admitting: *Deleted

## 2019-06-09 ENCOUNTER — Other Ambulatory Visit: Payer: Self-pay

## 2019-06-09 ENCOUNTER — Encounter: Payer: Self-pay | Admitting: *Deleted

## 2019-06-09 VITALS — BP 122/68 | HR 70 | Temp 97.0°F | Ht 72.0 in | Wt 238.4 lb

## 2019-06-09 DIAGNOSIS — Z0000 Encounter for general adult medical examination without abnormal findings: Secondary | ICD-10-CM

## 2019-06-09 NOTE — Patient Instructions (Signed)
See you next year!  Continue to eat heart healthy diet (full of fruits, vegetables, whole grains, lean protein, water--limit salt, fat, and sugar intake) and increase physical activity as tolerated.  Continue doing brain stimulating activities (puzzles, reading, adult coloring books, staying active) to keep memory sharp.   Bring a copy of your living will and/or healthcare power of attorney to your next office visit.   Jonathan [name] , Thank you for taking time to come for your Medicare Wellness Visit. I appreciate your ongoing commitment to your health goals. Please review the following plan we discussed and let me know if I can assist you in the future.   These are the goals we discussed: Goals    . Increase physical activity       This is a list of the screening recommended for you and due dates:  Health Maintenance  Topic Date Due  . Eye exam for diabetics  08/17/2018  . Hemoglobin A1C  10/01/2019  . Complete foot exam   01/02/2020  . Colon Cancer Screening  12/05/2021  . Tetanus Vaccine  11/17/2024  . Flu Shot  Completed  . Pneumonia vaccines  Completed    Health Maintenance After Age 41 After age 9, you are at a higher risk for certain long-term diseases and infections as well as injuries from falls. Falls are a major cause of broken bones and head injuries in people who are older than age 58. Getting regular preventive care can help to keep you healthy and well. Preventive care includes getting regular testing and making lifestyle changes as recommended by your health care provider. Talk with your health care provider about:  Which screenings and tests you should have. A screening is a test that checks for a disease when you have no symptoms.  A diet and exercise plan that is right for you. What should I know about screenings and tests to prevent falls? Screening and testing are the best ways to find a health problem early. Early diagnosis and treatment give you the best  chance of managing medical conditions that are common after age 17. Certain conditions and lifestyle choices may make you more likely to have a fall. Your health care provider may recommend:  Regular vision checks. Poor vision and conditions such as cataracts can make you more likely to have a fall. If you wear glasses, make sure to get your prescription updated if your vision changes.  Medicine review. Work with your health care provider to regularly review all of the medicines you are taking, including over-the-counter medicines. Ask your health care provider about any side effects that may make you more likely to have a fall. Tell your health care provider if any medicines that you take make you feel dizzy or sleepy.  Osteoporosis screening. Osteoporosis is a condition that causes the bones to get weaker. This can make the bones weak and cause them to break more easily.  Blood pressure screening. Blood pressure changes and medicines to control blood pressure can make you feel dizzy.  Strength and balance checks. Your health care provider may recommend certain tests to check your strength and balance while standing, walking, or changing positions.  Foot health exam. Foot pain and numbness, as well as not wearing proper footwear, can make you more likely to have a fall.  Depression screening. You may be more likely to have a fall if you have a fear of falling, feel emotionally low, or feel unable to do activities that you  used to do.  Alcohol use screening. Using too much alcohol can affect your balance and may make you more likely to have a fall. What actions can I take to lower my risk of falls? General instructions  Talk with your health care provider about your risks for falling. Tell your health care provider if: ? You fall. Be sure to tell your health care provider about all falls, even ones that seem minor. ? You feel dizzy, sleepy, or off-balance.  Take over-the-counter and  prescription medicines only as told by your health care provider. These include any supplements.  Eat a healthy diet and maintain a healthy weight. A healthy diet includes low-fat dairy products, low-fat (lean) meats, and fiber from whole grains, beans, and lots of fruits and vegetables. Home safety  Remove any tripping hazards, such as rugs, cords, and clutter.  Install safety equipment such as grab bars in bathrooms and safety rails on stairs.  Keep rooms and walkways well-lit. Activity   Follow a regular exercise program to stay fit. This will help you maintain your balance. Ask your health care provider what types of exercise are appropriate for you.  If you need a cane or walker, use it as recommended by your health care provider.  Wear supportive shoes that have nonskid soles. Lifestyle  Do not drink alcohol if your health care provider tells you not to drink.  If you drink alcohol, limit how much you have: ? 0-1 drink a day for women. ? 0-2 drinks a day for men.  Be aware of how much alcohol is in your drink. In the [place] one drink equals one typical bottle of beer (12 oz), one-half glass of wine (5 oz), or one shot of hard liquor (1 oz).  Do not use any products that contain nicotine or tobacco, such as cigarettes and e-cigarettes. If you need help quitting, ask your health care provider. Summary  Having a healthy lifestyle and getting preventive care can help to protect your health and wellness after age 67.  Screening and testing are the best way to find a health problem early and help you avoid having a fall. Early diagnosis and treatment give you the best chance for managing medical conditions that are more common for people who are older than age 73.  Falls are a major cause of broken bones and head injuries in people who are older than age 8. Take precautions to prevent a fall at home.  Work with your health care provider to learn what changes you can make to  improve your health and wellness and to prevent falls. This information is not intended to replace advice given to you by your health care provider. Make sure you discuss any questions you have with your health care provider. Document Released: 07/17/2017 Document Revised: 12/25/2018 Document Reviewed: 07/17/2017 Elsevier Patient Education  2020 [employer].

## 2019-06-26 ENCOUNTER — Other Ambulatory Visit: Payer: Self-pay | Admitting: Hematology

## 2019-06-26 LAB — JAK2 (INCLUDING V617F AND EXON 12), MPL,& CALR W/RFL MPN PANEL (NGS)

## 2019-06-30 ENCOUNTER — Other Ambulatory Visit (INDEPENDENT_AMBULATORY_CARE_PROVIDER_SITE_OTHER): Payer: Medicare Other

## 2019-06-30 ENCOUNTER — Other Ambulatory Visit: Payer: Self-pay

## 2019-06-30 DIAGNOSIS — E1165 Type 2 diabetes mellitus with hyperglycemia: Secondary | ICD-10-CM | POA: Diagnosis not present

## 2019-06-30 DIAGNOSIS — E291 Testicular hypofunction: Secondary | ICD-10-CM | POA: Diagnosis not present

## 2019-06-30 LAB — COMPREHENSIVE METABOLIC PANEL
ALT: 24 U/L (ref 0–53)
AST: 20 U/L (ref 0–37)
Albumin: 4 g/dL (ref 3.5–5.2)
Alkaline Phosphatase: 60 U/L (ref 39–117)
BUN: 20 mg/dL (ref 6–23)
CO2: 23 mEq/L (ref 19–32)
Calcium: 9.4 mg/dL (ref 8.4–10.5)
Chloride: 104 mEq/L (ref 96–112)
Creatinine, Ser: 1.35 mg/dL (ref 0.40–1.50)
GFR: 61.32 mL/min (ref >60.00)
Glucose, Bld: 151 mg/dL — ABNORMAL HIGH (ref 70–99)
Potassium: 3.9 mEq/L (ref 3.5–5.1)
Sodium: 137 mEq/L (ref 135–145)
Total Bilirubin: 0.5 mg/dL (ref 0.2–1.2)
Total Protein: 6.9 g/dL (ref 6.0–8.3)

## 2019-06-30 LAB — LIPID PANEL
Cholesterol: 118 mg/dL (ref 0–200)
HDL: 37.4 mg/dL — ABNORMAL LOW (ref >39.00)
LDL Cholesterol: 62 mg/dL (ref 0–99)
NonHDL: 81.03
Total CHOL/HDL Ratio: 3
Triglycerides: 94 mg/dL (ref 0.0–149.0)
VLDL: 18.8 mg/dL (ref 0.0–40.0)

## 2019-06-30 LAB — HEMOGLOBIN A1C: Hgb A1c MFr Bld: 7.9 % — ABNORMAL HIGH (ref 4.6–6.5)

## 2019-06-30 LAB — TESTOSTERONE: Testosterone: 210.21 ng/dL — ABNORMAL LOW (ref 300.00–890.00)

## 2019-07-02 ENCOUNTER — Other Ambulatory Visit: Payer: Self-pay

## 2019-07-02 ENCOUNTER — Ambulatory Visit (INDEPENDENT_AMBULATORY_CARE_PROVIDER_SITE_OTHER): Payer: Medicare Other | Admitting: Endocrinology

## 2019-07-02 ENCOUNTER — Encounter: Payer: Self-pay | Admitting: Endocrinology

## 2019-07-02 VITALS — BP 144/84 | HR 80 | Ht 72.0 in | Wt 239.8 lb

## 2019-07-02 DIAGNOSIS — I10 Essential (primary) hypertension: Secondary | ICD-10-CM | POA: Diagnosis not present

## 2019-07-02 DIAGNOSIS — I2510 Atherosclerotic heart disease of native coronary artery without angina pectoris: Secondary | ICD-10-CM

## 2019-07-02 DIAGNOSIS — D751 Secondary polycythemia: Secondary | ICD-10-CM | POA: Diagnosis not present

## 2019-07-02 DIAGNOSIS — E1165 Type 2 diabetes mellitus with hyperglycemia: Secondary | ICD-10-CM | POA: Diagnosis not present

## 2019-07-02 MED ORDER — FREESTYLE LITE DEVI: 1.0000 | Freq: Every day | Status: DC

## 2019-07-02 MED ORDER — FREESTYLE FREEDOM LITE W/DEVICE KIT: PACK | 0 refills | Status: DC

## 2019-07-02 NOTE — Patient Instructions (Signed)
Trulicity 2 shots at a time  Check blood sugars on waking up 3-4 days a week  Also check blood sugars about 2 hours after meals and do this after different meals by rotation  Recommended blood sugar levels on waking up are 90-130 and about 2 hours after meal is 130-160  Please bring your blood sugar monitor to each visit, thank you

## 2019-07-02 NOTE — Progress Notes (Signed)
Patient ID: Jonathan [name]., male   DOB: 1935-10-03, 83 y.o.   MRN: [id number]    Reason for Appointment:  Follow-up  History of Present Illness:          Diagnosis: Type 2 diabetes mellitus, date of diagnosis:  2009      Past history: He is not clear what his diabetes was diagnosed but according to hospital records it may have been in 2009. Most likely he was placed on metformin initially and at some point Amaryl added. In 2013 it was also given Januvia presumably to improve his control. However appears that his A1c has been consistently over 7% Janumet was started instead of metformin and Januvia separately in 2013  He was started on Trulicity in [id number] instead of Victoza because excessive bruising on his abdomen  Later in 01/2015 he was switched to Bydureon because of insurance noncoverage of his Trulicity  Recent history:   Non-insulin hypoglycemic drugs are: Metformin 1000 twice a day, Trulicity 1.5 mg weekly, Jardiance 25 mg daily  His A1c is still relatively high at 7.9   Current blood sugar patterns and problems:  He says he is having difficulty getting his meter to work not checked very many readings  Also checking blood sugars primarily in the morning  Lab glucose nonfasting was 151  He is not doing any formal exercise except housework  May be getting more carbohydrates or starchy foods at times but he thinks he is avoiding fried food and a lot of cheese  Weight is about the same     Side effects from medications have been: None  Glucose monitoring:  done  Once a day       Glucometer:  FreeStyle Blood Glucose readings  by download   FASTING blood sugar range 105-129, AVERAGE 137  Previous readings:  PRE-MEAL  morning Lunch Dinner Bedtime Overall  Glucose range:  103-158      Mean/median:      129   POST-MEAL PC Breakfast PC Lunch PC Dinner  Glucose range:  189    Mean/median:         Meals: 3 meals per day. eating egg/meat bread for  breakfast, dinner 6-7 PM    Exercise:  No formal exercise, previously was getting using exercise equipment at home for up to 60 minutes  Dietician visit: Most recent: 4/16 .               Weight history:  Wt Readings from Last 3 Encounters:  07/02/19 239 lb 12.8 oz (108.8 kg)  06/09/19 238 lb 6.4 oz (108.1 kg)  05/21/19 239 lb 3.2 oz (108.5 kg)   Glycemic control:   Lab Results  Component Value Date   HGBA1C 7.9 (H) 06/30/2019   HGBA1C 7.1 (H) 03/31/2019   HGBA1C 7.2 (H) 12/30/2018   Lab Results  Component Value Date   MICROALBUR 2.4 (H) 12/30/2018   LDLCALC 62 06/30/2019   CREATININE 1.35 06/30/2019    Lab Results  Component Value Date   FRUCTOSAMINE 284 03/29/2017   FRUCTOSAMINE 268 12/06/2016   FRUCTOSAMINE 282 02/22/2014    OTHER active problems addressed today: See review of systems  Lab on 06/30/2019  Component Date Value Ref Range Status  . Testosterone 06/30/2019 210.21* 300.00 - 890.00 ng/dL Final  . Sodium 06/30/2019 137  135 - 145 mEq/L Final  . Potassium 06/30/2019 3.9  3.5 - 5.1 mEq/L Final  . Chloride 06/30/2019 104  96 - 112 mEq/L Final  .  CO2 06/30/2019 23  19 - 32 mEq/L Final  . Glucose, Bld 06/30/2019 151* 70 - 99 mg/dL Final  . BUN 06/30/2019 20  6 - 23 mg/dL Final  . Creatinine, Ser 06/30/2019 1.35  0.40 - 1.50 mg/dL Final  . Total Bilirubin 06/30/2019 0.5  0.2 - 1.2 mg/dL Final  . Alkaline Phosphatase 06/30/2019 60  39 - 117 U/L Final  . AST 06/30/2019 20  0 - 37 U/L Final  . ALT 06/30/2019 24  0 - 53 U/L Final  . Total Protein 06/30/2019 6.9  6.0 - 8.3 g/dL Final  . Albumin 06/30/2019 4.0  3.5 - 5.2 g/dL Final  . Calcium 06/30/2019 9.4  8.4 - 10.5 mg/dL Final  . GFR 06/30/2019 61.32  >60.00 mL/min Final  . Cholesterol 06/30/2019 118  0 - 200 mg/dL Final   ATP III Classification       Desirable:  < 200 mg/dL               Borderline High:  200 - 239 mg/dL          High:  > = 240 mg/dL  . Triglycerides 06/30/2019 94.0  0.0 - 149.0 mg/dL  Final   Normal:  <150 mg/dLBorderline High:  150 - 199 mg/dL  . HDL 06/30/2019 37.40* >39.00 mg/dL Final  . VLDL 06/30/2019 18.8  0.0 - 40.0 mg/dL Final  . LDL Cholesterol 06/30/2019 62  0 - 99 mg/dL Final  . Total CHOL/HDL Ratio 06/30/2019 3   Final                  Men          Women1/2 Average Risk     3.4          3.3Average Risk          5.0          4.42X Average Risk          9.6          7.13X Average Risk          15.0          11.0                      . NonHDL 06/30/2019 81.03   Final   NOTE:  Non-HDL goal should be 30 mg/dL higher than patient's LDL goal (i.e. LDL goal of < 70 mg/dL, would have non-HDL goal of < 100 mg/dL)  . Hgb A1c MFr Bld 06/30/2019 7.9* 4.6 - 6.5 % Final   Glycemic Control Guidelines for People with Diabetes:Non Diabetic:  <6%Goal of Therapy: <7%Additional Action Suggested:  >8%      Allergies as of 07/02/2019   No Known Allergies     Medication List       Accurate as of July 02, 2019  8:55 AM. If you have any questions, ask your nurse or doctor.        amLODipine 10 MG tablet Commonly known as: NORVASC TAKE 1 TABLET DAILY   aspirin 81 MG tablet Take 81 mg by mouth daily.   cyclobenzaprine 10 MG tablet Commonly known as: FLEXERIL Take 1 tablet (10 mg total) by mouth at bedtime as needed for muscle spasms.   famotidine 20 MG tablet Commonly known as: Pepcid Take 1 tablet (20 mg total) by mouth 2 (two) times daily.   freestyle lancets USE AS INSTRUCTED TO CHECK BLOOD SUGAR TWICE A DAY   FreeStyle  Lite Devi 1 each by Does not apply route daily. Use as instructed to check blood sugar twice daily.  DX: E11.9   FREESTYLE LITE test strip Generic drug: glucose blood USE TO CHECK BLOOD SUGAR TWICE A DAY AS INSTRUCTED   gabapentin 100 MG capsule Commonly known as: NEURONTIN Take 1 capsule (100 mg total) by mouth 3 (three) times daily.   Jardiance 25 MG Tabs tablet Generic drug: empagliflozin Take 12.5 mg by mouth daily.   losartan  100 MG tablet Commonly known as: COZAAR Take 1 tablet (100 mg total) by mouth daily.   metFORMIN 1000 MG tablet Commonly known as: GLUCOPHAGE Take 1,000 mg by mouth daily with breakfast.   metoprolol succinate 100 MG 24 hr tablet Commonly known as: Toprol XL Take 1 tablet (100 mg total) by mouth daily.   multivitamin with minerals Tabs tablet Take 1 tablet by mouth daily.   Proventil HFA 108 (90 Base) MCG/ACT inhaler Generic drug: albuterol Inhale 2 puffs into the lungs every 6 (six) hours as needed for wheezing or shortness of breath.   simvastatin 20 MG tablet Commonly known as: ZOCOR Take 20 mg by mouth at bedtime.   Stiolto Respimat 2.5-2.5 MCG/ACT Aers Generic drug: Tiotropium Bromide-Olodaterol Inhale 2 puffs into the lungs daily as needed (for flares).   triamterene-hydrochlorothiazide 37.5-25 MG tablet Commonly known as: MAXZIDE-25 Take 1 tablet by mouth daily.   Trulicity 1.5 0000000 Sopn Generic drug: Dulaglutide INJECT 1.5 MG INTO THE SKIN WEEKLY       Allergies: No Known Allergies  Past Medical History:  Diagnosis Date  . CAD (coronary artery disease)    Two RCA stents remotely / 3rd RCA stent 2006  . Colon polyps    s/p several Cscopes.  Marland Kitchen COPD (chronic obstructive pulmonary disease) (HCC)    on O2, nocturnal  . Diabetes mellitus    dx aprox 2009  . ED (erectile dysfunction)    has a vacumm device  . Ejection fraction   . Hyperlipidemia    dx in 90s  . Hypertension    dx in the 90  . Hypogonadism male   . PVD (peripheral vascular disease) ([place])    s/p stents at [place] 2009, Dr Einar Gip  . Shortness of breath    O2 Sat dropped to 82% walking on the treadmill, September, 2012    Past Surgical History:  Procedure Laterality Date  . APPENDECTOMY    . TONSILLECTOMY      Family History  Problem Relation Age of Onset  . Heart disease Father   . Hypertension Father   . Stroke Father   . Diabetes Paternal Aunt   . Diabetes Maternal Grandmother    . Diabetes Other        GM, nephews, many family members  . Hyperlipidemia Other        ?  . Prostate cancer Brother   . Colon cancer Neg Hx     Social History:  reports that he quit smoking about 42 years ago. His smoking use included cigarettes. He has a 60.00 pack-year smoking history. He has never used smokeless tobacco. He reports current alcohol use. He reports that he does not use drugs.    Review of Systems        Lipids: He has been on treatment with simvastatin 20 mg with good control as follows       Lab Results  Component Value Date   CHOL 118 06/30/2019   HDL 37.40 (L) 06/30/2019  [place] 62 06/30/2019   LDLDIRECT 55.0 01/01/2018   TRIG 94.0 06/30/2019   CHOLHDL 3 06/30/2019                   HYPOGONADISM He had decreased libido and erectile dysfunction previously and was found to have hypogonadism, probably in 2011. He did have a slightly low free testosterone level in 2012 on treatment Prolactin level normal  Previously on AndroGel With his hemoglobin going up to 19.1 in August 2019 he has been told not to take any testosterone He does feel tired in the evenings but he thinks this is from his busy routine during the day and needing to take care of all the cooking taking care of his wife also  Testosterone level is gradually decreasing and now consistently below normal  Lab Results  Component Value Date   TESTOSTERONE 210.21 (L) 06/30/2019   TESTOSTERONE 280.49 (L) 03/31/2019   TESTOSTERONE 328 08/29/2018     He has been evaluated by hematologist for his erythrocytosis, no recent recommendations made   CBC Latest Ref Rng & Units 05/21/2019 04/08/2019 03/05/2019  WBC 4.0 - 10.5 K/uL 8.4 10.5 9.2  Hemoglobin 13.0 - 17.0 g/dL 18.0(H) 17.9 Repeated and verified X2.(H) 16.9  Hematocrit 39.0 - 52.0 % 53.1(H) 53.4 Repeated and verified X2.(H) 50.5  Platelets 150 - 400 K/uL 181 194.0 198          HYPERTENSION: This is followed by PCP and is on 100mg  of  losartan along with half Maxide, amlodipine and metoprolol  He has been checking blood pressure at home  BP Readings from Last 3 Encounters:  07/02/19 (!) 144/84  06/09/19 122/68  05/21/19 128/72   Lab Results  Component Value Date   CREATININE 1.35 06/30/2019   CREATININE 1.40 (H) 05/21/2019   CREATININE 1.41 03/31/2019     LABS:  Lab on 06/30/2019  Component Date Value Ref Range Status  . Testosterone 06/30/2019 210.21* 300.00 - 890.00 ng/dL Final  . Sodium 06/30/2019 137  135 - 145 mEq/L Final  . Potassium 06/30/2019 3.9  3.5 - 5.1 mEq/L Final  . Chloride 06/30/2019 104  96 - 112 mEq/L Final  . CO2 06/30/2019 23  19 - 32 mEq/L Final  . Glucose, Bld 06/30/2019 151* 70 - 99 mg/dL Final  . BUN 06/30/2019 20  6 - 23 mg/dL Final  . Creatinine, Ser 06/30/2019 1.35  0.40 - 1.50 mg/dL Final  . Total Bilirubin 06/30/2019 0.5  0.2 - 1.2 mg/dL Final  . Alkaline Phosphatase 06/30/2019 60  39 - 117 U/L Final  . AST 06/30/2019 20  0 - 37 U/L Final  . ALT 06/30/2019 24  0 - 53 U/L Final  . Total Protein 06/30/2019 6.9  6.0 - 8.3 g/dL Final  . Albumin 06/30/2019 4.0  3.5 - 5.2 g/dL Final  . Calcium 06/30/2019 9.4  8.4 - 10.5 mg/dL Final  . GFR 06/30/2019 61.32  >60.00 mL/min Final  . Cholesterol 06/30/2019 118  0 - 200 mg/dL Final   ATP III Classification       Desirable:  < 200 mg/dL               Borderline High:  200 - 239 mg/dL          High:  > = 240 mg/dL  . Triglycerides 06/30/2019 94.0  0.0 - 149.0 mg/dL Final   Normal:  <150 mg/dLBorderline High:  150 - 199 mg/dL  . HDL 06/30/2019 37.40* >39.00 mg/dL Final  .  VLDL 06/30/2019 18.8  0.0 - 40.0 mg/dL Final  . LDL Cholesterol 06/30/2019 62  0 - 99 mg/dL Final  . Total CHOL/HDL Ratio 06/30/2019 3   Final                  Men          Women1/2 Average Risk     3.4          3.3Average Risk          5.0          4.42X Average Risk          9.6          7.13X Average Risk          15.0          11.0                      . NonHDL  06/30/2019 81.03   Final   NOTE:  Non-HDL goal should be 30 mg/dL higher than patient's LDL goal (i.e. LDL goal of < 70 mg/dL, would have non-HDL goal of < 100 mg/dL)  . Hgb A1c MFr Bld 06/30/2019 7.9* 4.6 - 6.5 % Final   Glycemic Control Guidelines for People with Diabetes:Non Diabetic:  <6%Goal of Therapy: <7%Additional Action Suggested:  >8%     Physical Examination:  BP (!) 144/84 (BP Location: Left Arm, Patient Position: Sitting, Cuff Size: Normal)   Pulse 80   Ht 6' (1.829 m)   Wt 239 lb 12.8 oz (108.8 kg)   SpO2 96%   BMI 32.52 kg/m   No pedal edema    ASSESSMENT/PLAN:   Diabetes type 2, with mild obesity, non-insulin-dependent  His A1c is higher at 7.9  He has not checked readings consistently at home and no readings after meals Still not able to lose weight Previously had done better with exercise regimen Currently on 25 mg Jardiance, no recent renal dysfunction  Recommendations: He will try 3 mg of Trulicity instead of 1.5, currently not able to prescribe this on epic He has 11-month supply of the 1.5 mg dose and he will take 2 injections at a time If no nausea or other side effects with this he will call for 3 mg prescription in about a month Since he has difficulty with current FreeStyle meter he will be given a new prescription for replacement Will follow up in 2 months with fructosamine level  HYPERTENSION: He monitors at home and he will report any consistently high readings to his PCP  ERYTHROCYTOSIS  His hemoglobin is back to high levels and will need to check this again on the next visit, currently does not have a follow-up with hematologist until March  Hypogonadism, history of: His testosterone level is 210 He has nonspecific fatigue but not clear if this is related to hypogonadism  Treatment contraindicated currently because of erythrocytosis which appears to be from an independent process    There are no Patient Instructions on file for this visit.    Total visit time for evaluation and management of multiple problems and counseling =25 minutes  Elayne Snare 07/02/2019, 8:55 AM   Note: This office note was prepared with Dragon voice recognition system technology. Any transcriptional errors that result from this process are unintentional.

## 2019-08-03 ENCOUNTER — Other Ambulatory Visit: Payer: Self-pay

## 2019-08-03 ENCOUNTER — Telehealth: Payer: Self-pay | Admitting: Endocrinology

## 2019-08-03 MED ORDER — TRULICITY 1.5 MG/0.5ML ~~LOC~~ SOAJ: SUBCUTANEOUS | 0 refills | Status: DC

## 2019-08-03 NOTE — Telephone Encounter (Signed)
MEDICATION: Trulicity  PHARMACY:  [facility]  IS THIS A 90 DAY SUPPLY :   IS PATIENT OUT OF MEDICATION:   IF NOT; HOW MUCH IS LEFT:   LAST APPOINTMENT DATE: @10 /15/2020  NEXT APPOINTMENT DATE:@12 /06/2019  DO WE HAVE YOUR PERMISSION TO LEAVE A DETAILED MESSAGE:  OTHER COMMENTS: Patients dosage was changed so RX needs to be sent to pharmacy with new amount/dosing instructions   **Let patient know to contact pharmacy at the end of the day to make sure medication is ready. **  ** Please notify patient to allow 48-72 hours to process**  **Encourage patient to contact the pharmacy for refills or they can request refills through [facility]**

## 2019-08-03 NOTE — Telephone Encounter (Signed)
Rx sent 

## 2019-08-22 IMAGING — DX CHEST - 2 VIEW
2 series · 2 of 2 positions shown · non-contrast
Comparison: 03/05/2019

CLINICAL DATA: COPD colo up, diabetes mellitus, hypertension,
coronary artery disease

EXAM:
CHEST - 2 VIEW

[chest pa]
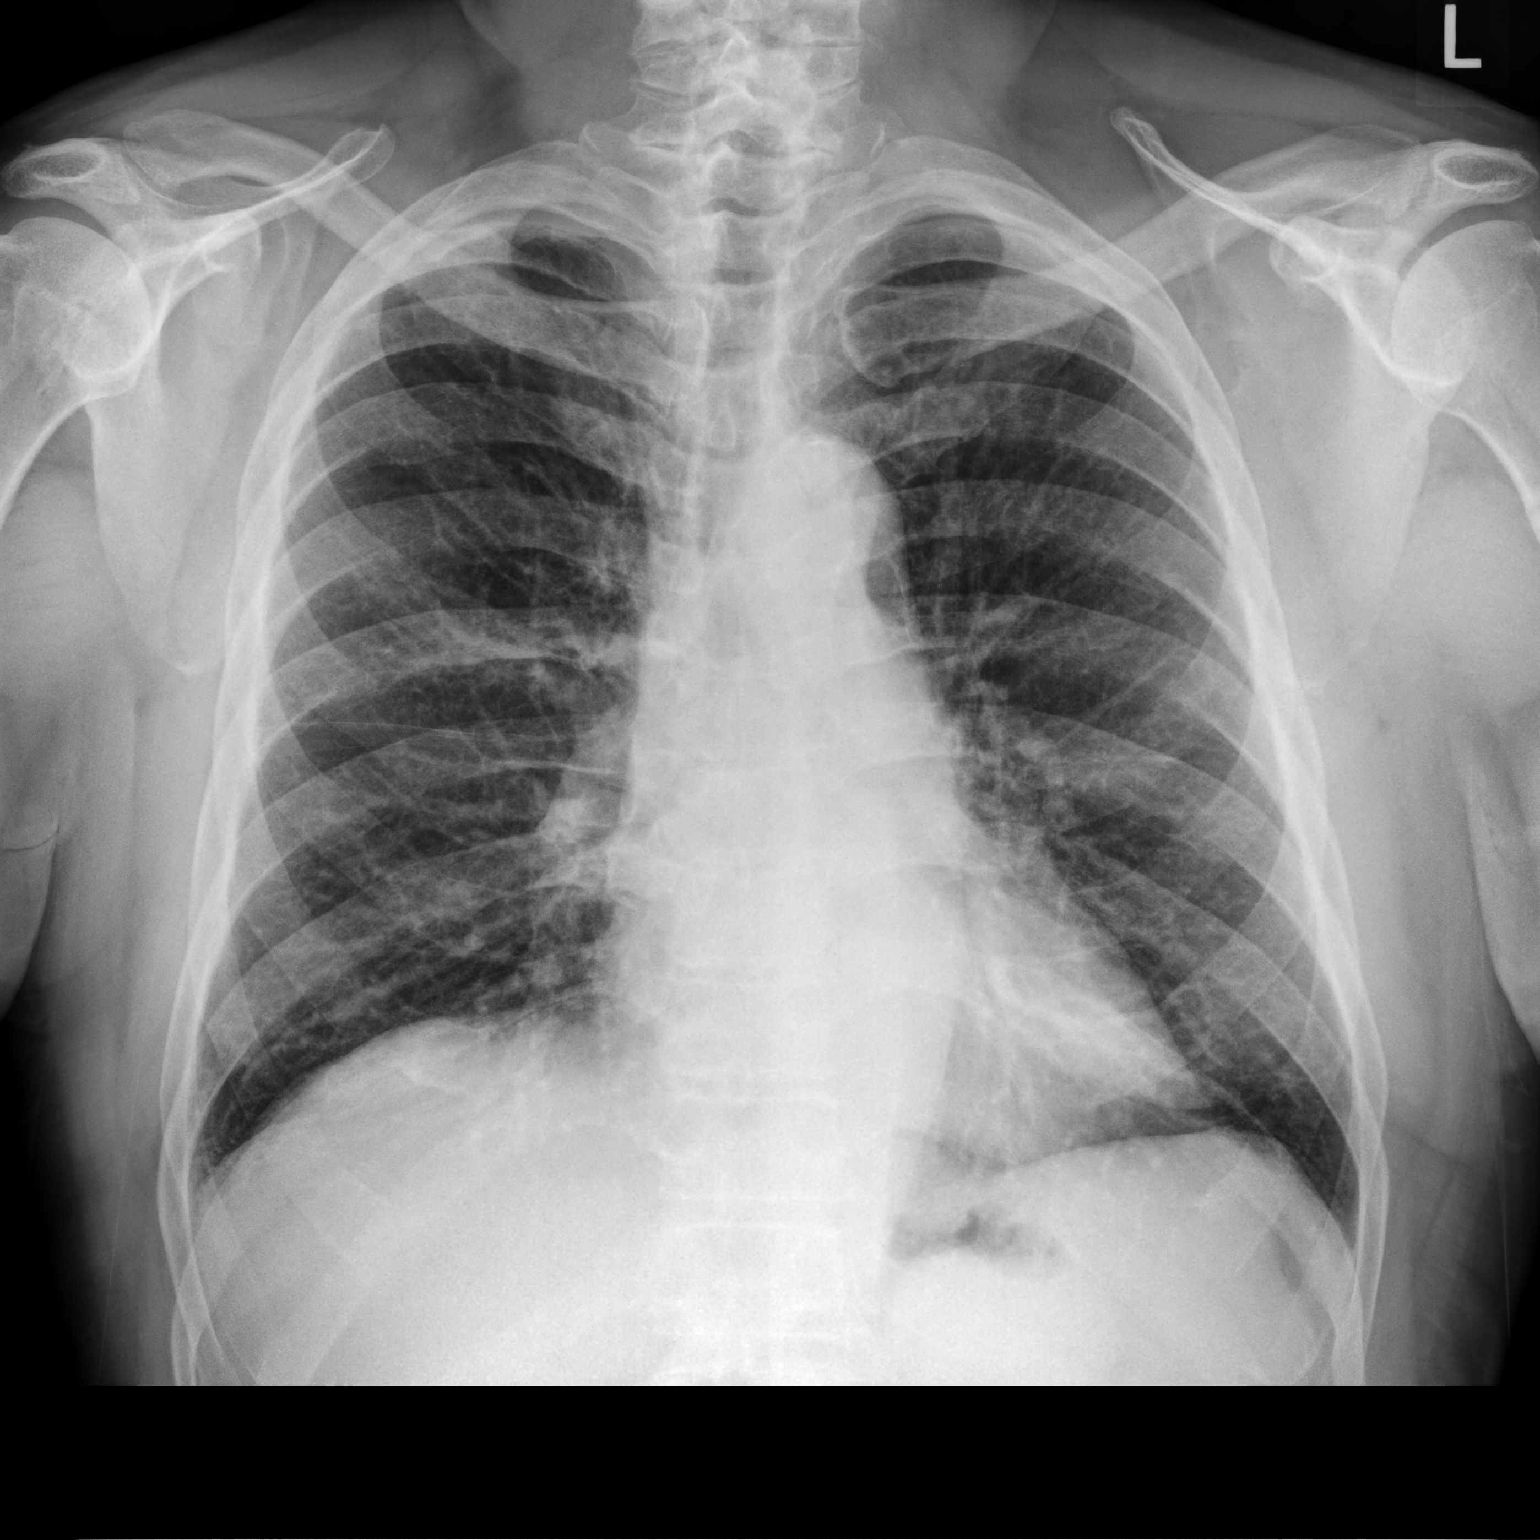

[chest lat]
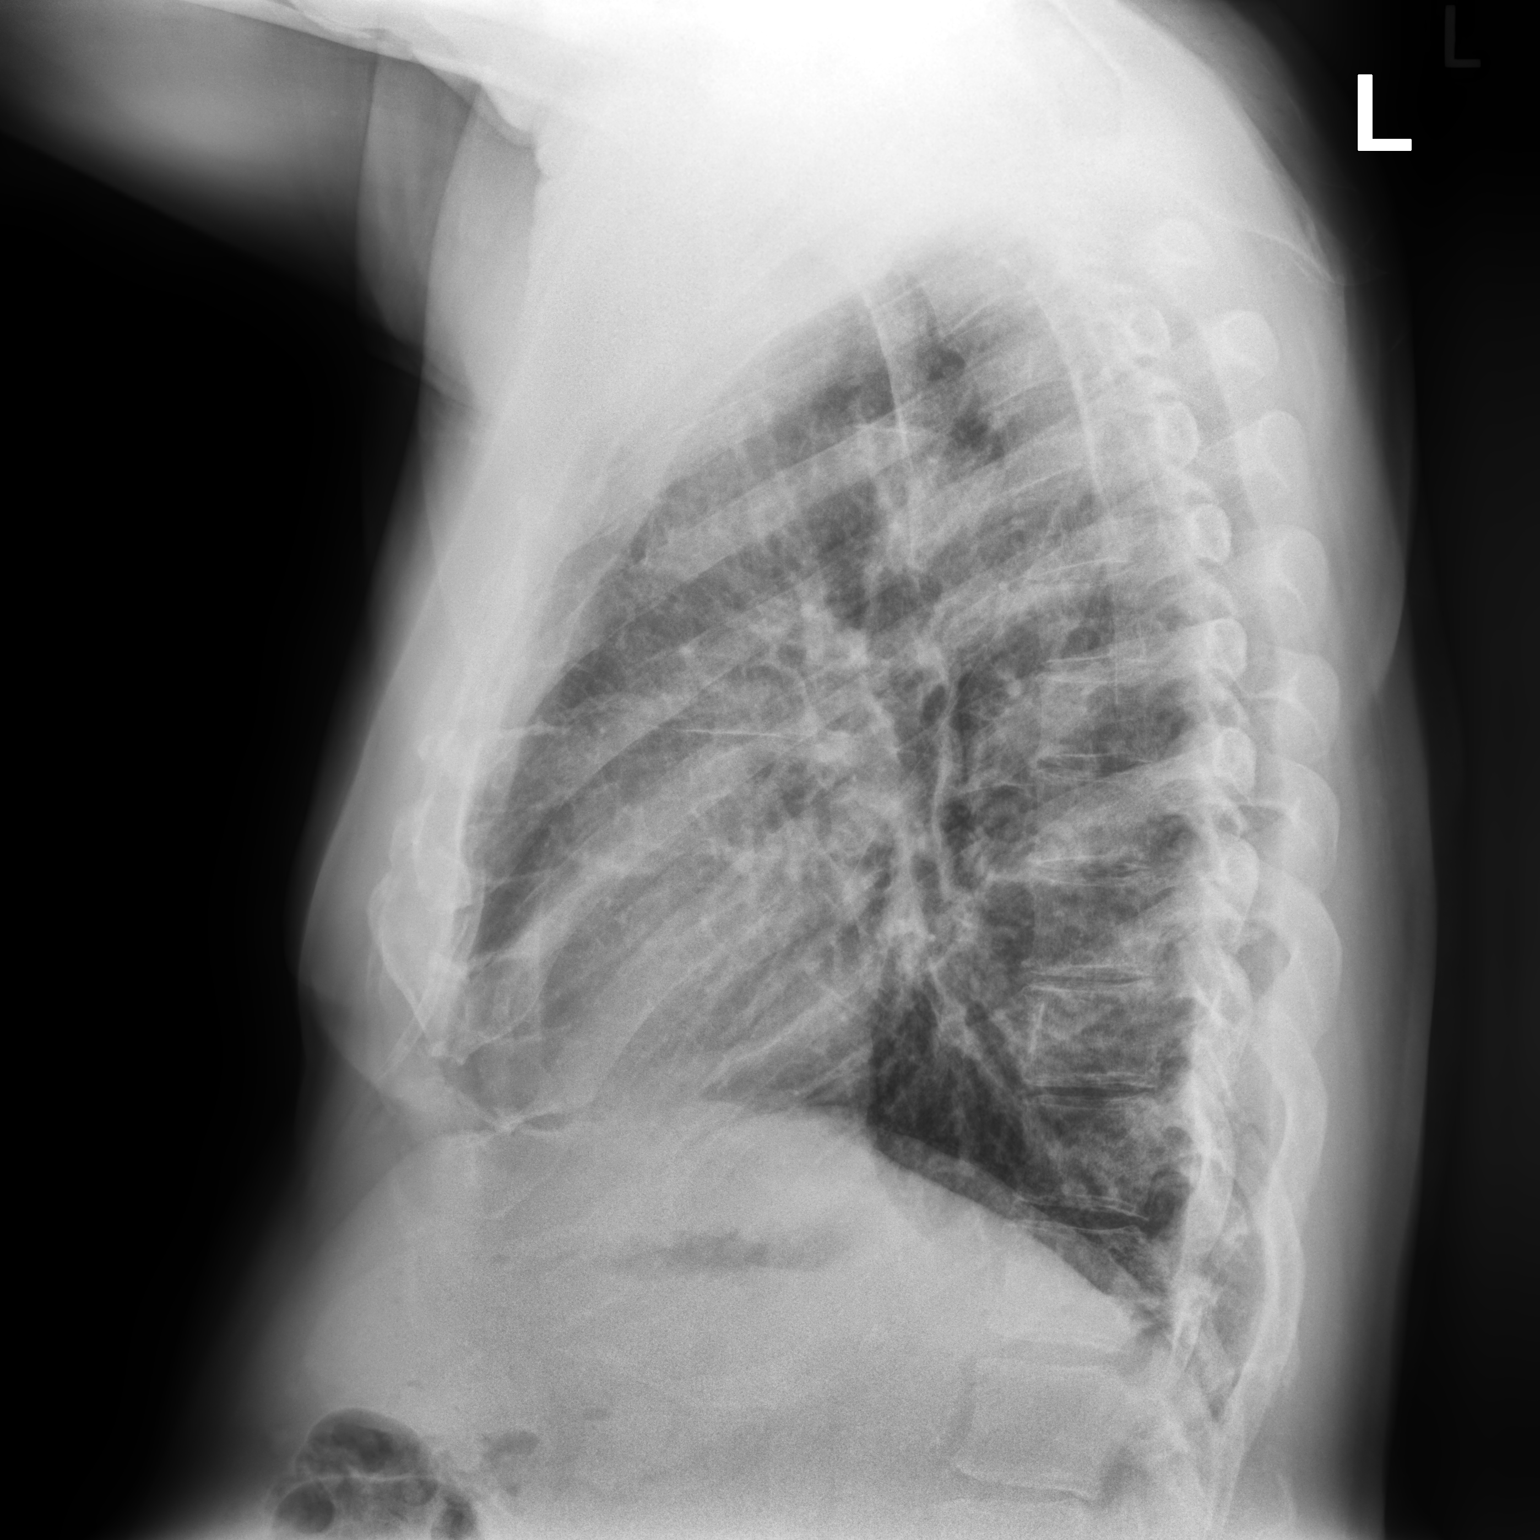

[2 of 2 positions shown; findings below may reference images not displayed]

FINDINGS: Normal heart size, mediastinal contours, and pulmonary vascularity.

Atherosclerotic calcification aorta.

Subsegmental atelectasis RIGHT upper lobe.

Lungs otherwise clear.

No infiltrate, pleural effusion, or pneumothorax.

Bones unremarkable.
IMPRESSION: Subsegmental atelectasis RIGHT upper lobe.

## 2019-08-26 ENCOUNTER — Other Ambulatory Visit (INDEPENDENT_AMBULATORY_CARE_PROVIDER_SITE_OTHER): Payer: Medicare Other

## 2019-08-26 ENCOUNTER — Other Ambulatory Visit: Payer: Self-pay

## 2019-08-26 DIAGNOSIS — D751 Secondary polycythemia: Secondary | ICD-10-CM

## 2019-08-26 DIAGNOSIS — E1165 Type 2 diabetes mellitus with hyperglycemia: Secondary | ICD-10-CM

## 2019-08-26 LAB — BASIC METABOLIC PANEL
BUN: 19 mg/dL (ref 6–23)
CO2: 27 mEq/L (ref 19–32)
Calcium: 9.7 mg/dL (ref 8.4–10.5)
Chloride: 102 mEq/L (ref 96–112)
Creatinine, Ser: 1.54 mg/dL — ABNORMAL HIGH (ref 0.40–1.50)
GFR: 52.66 mL/min — ABNORMAL LOW (ref >60.00)
Glucose, Bld: 205 mg/dL — ABNORMAL HIGH (ref 70–99)
Potassium: 4 mEq/L (ref 3.5–5.1)
Sodium: 137 mEq/L (ref 135–145)

## 2019-08-26 LAB — CBC
HCT: 52.5 % — ABNORMAL HIGH (ref 39.0–52.0)
Hemoglobin: 17.5 g/dL — ABNORMAL HIGH (ref 13.0–17.0)
MCHC: 33.4 g/dL (ref 30.0–36.0)
MCV: 98.3 fl (ref 78.0–100.0)
Platelets: 188 10*3/uL (ref 150.0–400.0)
RBC: 5.34 Mil/uL (ref 4.22–5.81)
RDW: 14.5 % (ref 11.5–15.5)
WBC: 8 10*3/uL (ref 4.0–10.5)

## 2019-08-27 ENCOUNTER — Other Ambulatory Visit: Payer: Medicare Other

## 2019-08-27 LAB — FRUCTOSAMINE: Fructosamine: 284 umol/L (ref 0–285)

## 2019-09-01 ENCOUNTER — Ambulatory Visit (INDEPENDENT_AMBULATORY_CARE_PROVIDER_SITE_OTHER): Payer: Medicare Other | Admitting: Endocrinology

## 2019-09-01 ENCOUNTER — Encounter: Payer: Self-pay | Admitting: Endocrinology

## 2019-09-01 VITALS — BP 110/62 | HR 80 | Ht 72.0 in | Wt 240.2 lb

## 2019-09-01 DIAGNOSIS — N289 Disorder of kidney and ureter, unspecified: Secondary | ICD-10-CM | POA: Diagnosis not present

## 2019-09-01 DIAGNOSIS — I2510 Atherosclerotic heart disease of native coronary artery without angina pectoris: Secondary | ICD-10-CM | POA: Diagnosis not present

## 2019-09-01 DIAGNOSIS — E1165 Type 2 diabetes mellitus with hyperglycemia: Secondary | ICD-10-CM | POA: Diagnosis not present

## 2019-09-01 MED ORDER — TRULICITY 3 MG/0.5ML ~~LOC~~ SOAJ: 3.0000 mg | SUBCUTANEOUS | 1 refills | Status: DC

## 2019-09-01 NOTE — Progress Notes (Signed)
Patient ID: Jonathan Leather., male   DOB: Oct 03, 1935, 83 y.o.   MRN: [id number]    Reason for Appointment:  Follow-up  History of Present Illness:          Diagnosis: Type 2 diabetes mellitus, date of diagnosis:  2009      Past history: He is not clear what his diabetes was diagnosed but according to hospital records it may have been in 2009. Most likely he was placed on metformin initially and at some point Amaryl added. In 2013 it was also given Januvia presumably to improve his control. However appears that his A1c has been consistently over 7% Janumet was started instead of metformin and Januvia separately in 2013  He was started on Trulicity in 2/42 instead of Victoza because excessive bruising on his abdomen  Later in 01/2015 he was switched to Bydureon because of insurance noncoverage of his Trulicity  Recent history:   Non-insulin hypoglycemic drugs are: Metformin 1000 twice a day, Trulicity 1.5 mg weekly, Jardiance 25 mg daily  His A1c on the last visit was relatively high at 7.9 Fructosamine is 284  Current blood sugar patterns and problems:  He was given a new meter to use on his last visit in October but he still has not used it  This is reading falsely low as his blood glucose was 205 around the same time with his home blood sugar was 133  Also he was told to double his Trulicity to 3 mg but he has not done so  He says he cannot afford to increase his doses without a new prescription  Also his Vania Rea has been continued at 25 mg dosage  Most of his blood sugars are early morning and only sporadically later in the day  However difficult to judge his blood sugars since his meter appears to be inaccurate  Weight is about the same  As before not doing any exercise like walking on the exercise equipment at home as he is preoccupied with taking care of his wife     Side effects from medications have been: None  Glucose monitoring:  done  Once a day        Glucometer:  FreeStyle Blood Glucose readings  by download    PRE-MEAL Fasting Lunch Dinner  overnight Overall  Glucose range:  111-161    128  111-184  Mean/median:  132    137   POST-MEAL PC Breakfast PC Lunch PC Dinner  Glucose range:    120-184  Mean/median:       PREVIOUS FASTING blood sugar range 105-129, AVERAGE 137     Meals: 3 meals per day. eating egg/meat bread for breakfast, dinner 6-7 PM     Dietician visit: Most recent: 4/16 .               Weight history:  Wt Readings from Last 3 Encounters:  09/01/19 240 lb 3.2 oz (109 kg)  07/02/19 239 lb 12.8 oz (108.8 kg)  06/09/19 238 lb 6.4 oz (108.1 kg)   Glycemic control:   Lab Results  Component Value Date   HGBA1C 7.9 (H) 06/30/2019   HGBA1C 7.1 (H) 03/31/2019   HGBA1C 7.2 (H) 12/30/2018   Lab Results  Component Value Date   MICROALBUR 2.4 (H) 12/30/2018   LDLCALC 62 06/30/2019   CREATININE 1.54 (H) 08/26/2019    Lab Results  Component Value Date   FRUCTOSAMINE 284 08/26/2019   FRUCTOSAMINE 284 03/29/2017   FRUCTOSAMINE  268 12/06/2016    OTHER active problems addressed today: See review of systems  Lab on 08/26/2019  Component Date Value Ref Range Status  . WBC 08/26/2019 8.0  4.0 - 10.5 K/uL Final  . RBC 08/26/2019 5.34  4.22 - 5.81 Mil/uL Final  . Platelets 08/26/2019 188.0  150.0 - 400.0 K/uL Final  . Hemoglobin 08/26/2019 17.5* 13.0 - 17.0 g/dL Final  . HCT 08/26/2019 52.5* 39.0 - 52.0 % Final  . MCV 08/26/2019 98.3  78.0 - 100.0 fl Final  . MCHC 08/26/2019 33.4  30.0 - 36.0 g/dL Final  . RDW 08/26/2019 14.5  11.5 - 15.5 % Final  . Fructosamine 08/26/2019 284  0 - 285 umol/L Final   Comment: Published reference interval for apparently healthy subjects between age 17 and 53 is 82 - 285 umol/L and in a poorly controlled diabetic population is 228 - 563 umol/L with a mean of 396 umol/L.   Marland Kitchen Sodium 08/26/2019 137  135 - 145 mEq/L Final  . Potassium 08/26/2019 4.0  3.5 - 5.1 mEq/L Final   . Chloride 08/26/2019 102  96 - 112 mEq/L Final  . CO2 08/26/2019 27  19 - 32 mEq/L Final  . Glucose, Bld 08/26/2019 205* 70 - 99 mg/dL Final  . BUN 08/26/2019 19  6 - 23 mg/dL Final  . Creatinine, Ser 08/26/2019 1.54* 0.40 - 1.50 mg/dL Final  . GFR 08/26/2019 52.66* >60.00 mL/min Final  . Calcium 08/26/2019 9.7  8.4 - 10.5 mg/dL Final     Allergies as of 09/01/2019   No Known Allergies     Medication List       Accurate as of September 01, 2019  2:43 PM. If you have any questions, ask your nurse or doctor.        STOP taking these medications   Trulicity 1.5 DX/8.3JA Sopn Generic drug: Dulaglutide Replaced by: Trulicity 3 SN/0.5LZ Sopn Stopped by: Elayne Snare, MD     TAKE these medications   amLODipine 10 MG tablet Commonly known as: NORVASC TAKE 1 TABLET DAILY   aspirin 81 MG tablet Take 81 mg by mouth daily.   cyclobenzaprine 10 MG tablet Commonly known as: FLEXERIL Take 1 tablet (10 mg total) by mouth at bedtime as needed for muscle spasms.   famotidine 20 MG tablet Commonly known as: Pepcid Take 1 tablet (20 mg total) by mouth 2 (two) times daily.   FreeStyle Freedom Lite w/Device Kit Use Freestyle Freedom Lite meter to check blood sugar twice daily. DX:E11.65   freestyle lancets USE AS INSTRUCTED TO CHECK BLOOD SUGAR TWICE A DAY   FREESTYLE LITE test strip Generic drug: glucose blood USE TO CHECK BLOOD SUGAR TWICE A DAY AS INSTRUCTED   gabapentin 100 MG capsule Commonly known as: NEURONTIN Take 1 capsule (100 mg total) by mouth 3 (three) times daily. What changed:   how much to take  when to take this  additional instructions   Jardiance 25 MG Tabs tablet Generic drug: empagliflozin Take 12.5 mg by mouth daily.   losartan 100 MG tablet Commonly known as: COZAAR Take 1 tablet (100 mg total) by mouth daily.   metFORMIN 1000 MG tablet Commonly known as: GLUCOPHAGE Take 1,000 mg by mouth daily with breakfast.   metoprolol succinate 100 MG  24 hr tablet Commonly known as: Toprol XL Take 1 tablet (100 mg total) by mouth daily.   multivitamin with minerals Tabs tablet Take 1 tablet by mouth daily.   Proventil HFA 108 (90  Base) MCG/ACT inhaler Generic drug: albuterol Inhale 2 puffs into the lungs every 6 (six) hours as needed for wheezing or shortness of breath.   simvastatin 20 MG tablet Commonly known as: ZOCOR Take 20 mg by mouth at bedtime.   Stiolto Respimat 2.5-2.5 MCG/ACT Aers Generic drug: Tiotropium Bromide-Olodaterol Inhale 2 puffs into the lungs daily as needed (for flares).   triamterene-hydrochlorothiazide 37.5-25 MG tablet Commonly known as: MAXZIDE-25 Take 1 tablet by mouth daily.   Trulicity 3 WF/0.9NA Sopn Generic drug: Dulaglutide Inject 3 mg into the skin once a week. Replaces: Trulicity 1.5 TF/5.7DU Sopn Started by: Elayne Snare, MD       Allergies: No Known Allergies  Past Medical History:  Diagnosis Date  . CAD (coronary artery disease)    Two RCA stents remotely / 3rd RCA stent 2006  . Colon polyps    s/p several Cscopes.  Marland Kitchen COPD (chronic obstructive pulmonary disease) (HCC)    on O2, nocturnal  . Diabetes mellitus    dx aprox 2009  . ED (erectile dysfunction)    has a vacumm device  . Ejection fraction   . Hyperlipidemia    dx in 90s  . Hypertension    dx in the 90  . Hypogonadism male   . PVD (peripheral vascular disease) ([place])    s/p stents at LE 2009, Dr Einar Gip  . Shortness of breath    O2 Sat dropped to 82% walking on the treadmill, September, 2012    Past Surgical History:  Procedure Laterality Date  . APPENDECTOMY    . TONSILLECTOMY      Family History  Problem Relation Age of Onset  . Heart disease Father   . Hypertension Father   . Stroke Father   . Diabetes Paternal Aunt   . Diabetes Maternal Grandmother   . Diabetes Other        GM, nephews, many family members  . Hyperlipidemia Other        ?  . Prostate cancer Brother   . Colon cancer Neg Hx      Social History:  reports that he quit smoking about 42 years ago. His smoking use included cigarettes. He has a 60.00 pack-year smoking history. He has never used smokeless tobacco. He reports current alcohol use. He reports that he does not use drugs.    Review of Systems        Lipids: He has been on treatment with simvastatin 20 mg with good control as follows       Lab Results  Component Value Date   CHOL 118 06/30/2019   HDL 37.40 (L) 06/30/2019   LDLCALC 62 06/30/2019   LDLDIRECT 55.0 01/01/2018   TRIG 94.0 06/30/2019   CHOLHDL 3 06/30/2019                   HYPOGONADISM He had decreased libido and erectile dysfunction previously and was found to have hypogonadism, probably in 2011. He did have a slightly low free testosterone level in 2012 on treatment Prolactin level normal  Previously on AndroGel With his hemoglobin going up to 19.1 in August 2019 he has been told not to take any testosterone He does feel tired in the evenings but he thinks this is from his busy routine during the day and needing to take care of all the cooking taking care of his wife also  Testosterone level is gradually decreasing and consistently below normal this year  Lab Results  Component Value Date  TESTOSTERONE 210.21 (L) 06/30/2019   TESTOSTERONE 280.49 (L) 03/31/2019   TESTOSTERONE 328 08/29/2018     He has been evaluated by hematologist for his erythrocytosis, no recent recommendations made   CBC Latest Ref Rng & Units 08/26/2019 05/21/2019 04/08/2019  WBC 4.0 - 10.5 K/uL 8.0 8.4 10.5  Hemoglobin 13.0 - 17.0 g/dL 17.5(H) 18.0(H) 17.9 Repeated and verified X2.(H)  Hematocrit 39.0 - 52.0 % 52.5(H) 53.1(H) 53.4 Repeated and verified X2.(H)  Platelets 150.0 - 400.0 K/uL 188.0 181 194.0          HYPERTENSION: This is followed by PCP and is on '100mg'$  of losartan along with half Maxide, amlodipine and metoprolol   He has been checking blood pressure at home Home BP recently  120-130/65-72  BP Readings from Last 3 Encounters:  09/01/19 110/62  07/02/19 (!) 144/84  06/09/19 122/68   RENAL dysfunction: His creatinine appears to be higher and not clear why, did not take any OTC nonsteroidal anti-inflammatory drugs recently  Lab Results  Component Value Date   CREATININE 1.54 (H) 08/26/2019   CREATININE 1.35 06/30/2019   CREATININE 1.40 (H) 05/21/2019     LABS:  Lab on 08/26/2019  Component Date Value Ref Range Status  . WBC 08/26/2019 8.0  4.0 - 10.5 K/uL Final  . RBC 08/26/2019 5.34  4.22 - 5.81 Mil/uL Final  . Platelets 08/26/2019 188.0  150.0 - 400.0 K/uL Final  . Hemoglobin 08/26/2019 17.5* 13.0 - 17.0 g/dL Final  . HCT 08/26/2019 52.5* 39.0 - 52.0 % Final  . MCV 08/26/2019 98.3  78.0 - 100.0 fl Final  . MCHC 08/26/2019 33.4  30.0 - 36.0 g/dL Final  . RDW 08/26/2019 14.5  11.5 - 15.5 % Final  . Fructosamine 08/26/2019 284  0 - 285 umol/L Final   Comment: Published reference interval for apparently healthy subjects between age 66 and 54 is 50 - 285 umol/L and in a poorly controlled diabetic population is 228 - 563 umol/L with a mean of 396 umol/L.   Marland Kitchen Sodium 08/26/2019 137  135 - 145 mEq/L Final  . Potassium 08/26/2019 4.0  3.5 - 5.1 mEq/L Final  . Chloride 08/26/2019 102  96 - 112 mEq/L Final  . CO2 08/26/2019 27  19 - 32 mEq/L Final  . Glucose, Bld 08/26/2019 205* 70 - 99 mg/dL Final  . BUN 08/26/2019 19  6 - 23 mg/dL Final  . Creatinine, Ser 08/26/2019 1.54* 0.40 - 1.50 mg/dL Final  . GFR 08/26/2019 52.66* >60.00 mL/min Final  . Calcium 08/26/2019 9.7  8.4 - 10.5 mg/dL Final    Physical Examination:  BP 110/62 (BP Location: Left Arm, Patient Position: Sitting, Cuff Size: Large)   Pulse 80   Ht 6' (1.829 m)   Wt 240 lb 3.2 oz (109 kg)   SpO2 92%   BMI 32.58 kg/m   No edema present    ASSESSMENT/PLAN:   Diabetes type 2, with mild obesity, non-insulin-dependent  His A1c was last higher at 7.9  Although fructosamine is upper  normal he has a high fasting reading of 205 Home blood sugars are inaccurate and he needs a new meter Has not lost any weight Also did not increase his Trulicity to 3 mg as directed   Recommendations: He will be given a new prescription for 3 mg of Trulicity instead of 1.5 weekly He will bring his monitor for training and set up this afternoon  HYPERTENSION: Blood pressure is low normal Because of his renal dysfunction will  reduce his LOSARTAN to half a tablet for now while continuing 25 mg Jardiance Will need short-term follow-up with his PCP  ERYTHROCYTOSIS  His hemoglobin is slightly improved and will continue to monitor Etiology is unclear  Hypogonadism, history of: His testosterone level was last 210 Currently not complaining of any fatigue and likely does not need any treatment especially with his continued erythrocytosis     Patient Instructions  Losartan 1/2 daily  Check blood sugars on waking up 3-4/7  days a week  Also check blood sugars about 2 hours after meals and do this after different meals by rotation  Recommended blood sugar levels on waking up are 90-130 and about 2 hours after meal is 130-160  Please bring your blood sugar monitor to each visit, thank you     Total visit time for evaluation and management of multiple problems and counseling =25 minutes   Elayne Snare 09/01/2019, 2:43 PM   Note: This office note was prepared with Dragon voice recognition system technology. Any transcriptional errors that result from this process are unintentional.

## 2019-09-01 NOTE — Patient Instructions (Addendum)
Losartan 1/2 daily  Check blood sugars on waking up 3-4/7  days a week  Also check blood sugars about 2 hours after meals and do this after different meals by rotation  Recommended blood sugar levels on waking up are 90-130 and about 2 hours after meal is 130-160  Please bring your blood sugar monitor to each visit, thank you

## 2019-09-08 ENCOUNTER — Other Ambulatory Visit: Payer: Self-pay

## 2019-09-09 ENCOUNTER — Encounter: Payer: Self-pay | Admitting: Internal Medicine

## 2019-09-09 ENCOUNTER — Ambulatory Visit (INDEPENDENT_AMBULATORY_CARE_PROVIDER_SITE_OTHER): Payer: Medicare Other | Admitting: Internal Medicine

## 2019-09-09 VITALS — BP 104/74 | HR 72 | Temp 97.7°F | Resp 16 | Ht 72.0 in | Wt 241.4 lb

## 2019-09-09 DIAGNOSIS — I2510 Atherosclerotic heart disease of native coronary artery without angina pectoris: Secondary | ICD-10-CM

## 2019-09-09 DIAGNOSIS — N529 Male erectile dysfunction, unspecified: Secondary | ICD-10-CM

## 2019-09-09 DIAGNOSIS — E782 Mixed hyperlipidemia: Secondary | ICD-10-CM | POA: Diagnosis not present

## 2019-09-09 DIAGNOSIS — I10 Essential (primary) hypertension: Secondary | ICD-10-CM

## 2019-09-09 NOTE — Progress Notes (Signed)
Subjective:    Patient ID: Jonathan [name]., male    DOB: 03/09/1936, 83 y.o.   MRN: [id number]  DOS:  09/09/2019 Type of visit - description: Routine visit HTN: Reports very good ambulatory BPs DM: Per Endo Neuropathy: On gabapentin, he is actually taking it only at night ED: Needs a referral to urology Labs reviewed   Review of Systems Denies chest pain no difficulty breathing.  No DOE with routine activities No lower extremity edema, no palpitations No cough  Past Medical History:  Diagnosis Date  . CAD (coronary artery disease)    Two RCA stents remotely / 3rd RCA stent 2006  . Colon polyps    s/p several Cscopes.  Marland Kitchen COPD (chronic obstructive pulmonary disease) (HCC)    on O2, nocturnal  . Diabetes mellitus    dx aprox 2009  . ED (erectile dysfunction)    has a vacumm device  . Ejection fraction   . Hyperlipidemia    dx in 90s  . Hypertension    dx in the 90  . Hypogonadism male   . PVD (peripheral vascular disease) ([place])    s/p stents at LE 2009, Dr Einar Gip  . Shortness of breath    O2 Sat dropped to 82% walking on the treadmill, September, 2012    Past Surgical History:  Procedure Laterality Date  . APPENDECTOMY    . TONSILLECTOMY      Social History   Socioeconomic History  . Marital status: Married    Spouse name: Cerrella   . Number of children: 6  . Years of education: Not on file  . Highest education level: Not on file  Occupational History  . 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 Smoking status: Former Smoker    Packs/day: 2.00    Years: 30.00    Pack years: 60.00    Types: Cigarettes    Quit date: 06/17/1977    Years since quitting: 42.2  . Smokeless tobacco: Never Used  . Tobacco comment: 2 ppd, quit 1987  Substance and Sexual Activity  . Alcohol use: Yes    Alcohol/week: 0.0 standard drinks    Comment: socially   . Drug use: No  . Sexual activity: Yes  Other Topics Concern  . Not on  file  Social History Narrative   4 children (lost 1 son)   Wife has 2 children                Social Determinants of Health   Financial Resource Strain:   . Difficulty of Paying Living Expenses: Not on file  Food Insecurity:   . Worried About Charity fundraiser in the Last Year: Not on file  . Ran Out of Food in the Last Year: Not on file  Transportation Needs:   . Lack of Transportation (Medical): Not on file  . Lack of Transportation (Non-Medical): Not on file  Physical Activity:   . Days of Exercise per Week: Not on file  . Minutes of Exercise per Session: Not on file  Stress:   . Feeling of Stress : Not on file  Social Connections:   . Frequency of Communication with Friends and Family: Not on file  . Frequency of Social Gatherings with Friends and Family: Not on file  . Attends Religious Services: Not on file  . Active Member of Clubs or Organizations: Not on file  . Attends Archivist Meetings:  Not on file  . Marital Status: Not on file  Intimate Partner Violence:   . Fear of Current or Ex-Partner: Not on file  . Emotionally Abused: Not on file  . Physically Abused: Not on file  . Sexually Abused: Not on file      Allergies as of 09/09/2019   No Known Allergies     Medication List       Accurate as of September 09, 2019 11:59 PM. If you have any questions, ask your nurse or doctor.        STOP taking these medications   cyclobenzaprine 10 MG tablet Commonly known as: FLEXERIL Stopped by: Kathlene November, MD     TAKE these medications   amLODipine 10 MG tablet Commonly known as: NORVASC TAKE 1 TABLET DAILY   aspirin 81 MG tablet Take 81 mg by mouth daily.   famotidine 20 MG tablet Commonly known as: Pepcid Take 1 tablet (20 mg total) by mouth 2 (two) times daily.   FreeStyle Freedom Lite w/Device Kit Use Freestyle Freedom Lite meter to check blood sugar twice daily. DX:E11.65   freestyle lancets USE AS INSTRUCTED TO CHECK BLOOD SUGAR  TWICE A DAY   FREESTYLE LITE test strip Generic drug: glucose blood USE TO CHECK BLOOD SUGAR TWICE A DAY AS INSTRUCTED   gabapentin 100 MG capsule Commonly known as: NEURONTIN Take 200-300 mg by mouth at bedtime as needed. What changed: Another medication with the same name was removed. Continue taking this medication, and follow the directions you see here. Changed by: Kathlene November, MD   Jardiance 25 MG Tabs tablet Generic drug: empagliflozin Take 12.5 mg by mouth daily.   losartan 100 MG tablet Commonly known as: COZAAR Take 1 tablet (100 mg total) by mouth daily.   metFORMIN 1000 MG tablet Commonly known as: GLUCOPHAGE Take 1,000 mg by mouth daily with breakfast.   metoprolol succinate 100 MG 24 hr tablet Commonly known as: Toprol XL Take 1 tablet (100 mg total) by mouth daily.   multivitamin with minerals Tabs tablet Take 1 tablet by mouth daily.   Proventil HFA 108 (90 Base) MCG/ACT inhaler Generic drug: albuterol Inhale 2 puffs into the lungs every 6 (six) hours as needed for wheezing or shortness of breath.   simvastatin 20 MG tablet Commonly known as: ZOCOR Take 20 mg by mouth at bedtime.   Stiolto Respimat 2.5-2.5 MCG/ACT Aers Generic drug: Tiotropium Bromide-Olodaterol Inhale 2 puffs into the lungs daily as needed (for flares).   triamterene-hydrochlorothiazide 37.5-25 MG tablet Commonly known as: MAXZIDE-25 Take 1 tablet by mouth daily.   Trulicity 3 JT/7.0VX Sopn Generic drug: Dulaglutide Inject 3 mg into the skin once a week.           Objective:   Physical Exam BP 104/74 (BP Location: Left Arm, Patient Position: Sitting, Cuff Size: Normal)   Pulse 72   Temp 97.7 F (36.5 C) (Temporal)   Resp 16   Ht 6' (1.829 m)   Wt 241 lb 6 oz (109.5 kg)   SpO2 95%   BMI 32.74 kg/m  General:   Well developed, NAD, BMI noted.  HEENT:  Normocephalic . Face symmetric, atraumatic Lungs:  CTA B Normal respiratory effort, no intercostal retractions, no  accessory muscle use. Heart: RRR,  no murmur.  no pretibial edema bilaterally     Skin: Not pale. Not jaundice Neurologic:  alert & oriented X3.  Speech normal, gait appropriate for age and unassisted Psych--  Cognition and judgment  appear intact.  Cooperative with normal attention span and concentration.  Behavior appropriate. No anxious or depressed appearing.     Assessment    Assessment DM dx ~9927, complicated by CAD, PVD, mild neuropathy. Sees Dr. Dwyane Dee Hypogonadism. Sees Dr. Dwyane Dee HTN Hyperlipidemia COPD, nocturnal O2 prn Back pain, chronic: See visit 10/12/2016 CV: -CAD, Dr. Ron Parker Dr Meda Coffee S/p  stenting 2 to the RCA remote past and the third RCA stent in  2006, negative stress test in 2012, LVEF 55% on echo 2015 -Peripheral vascular disease, stents lower extremity 2009  ED Polycythemia: Saw hematology 11-2018, felt to be due to chronic respiratory failure.  Not likely polycythemia vera  PLAN    DM: per Dr Dwyane Dee HTN: Seems well controlled here at the office and reports good BPs at home.  Continue Maxide, Toprol, losartan, amlodipine. High cholesterol: On simvastatin, last FLP very good CAD: Asymptomatic COPD: To see pulmonary soon.  Essentially asymptomatic. ED: Needs to see urology again reference MUSE injections, referral sent Patient education: Has a number of questions about the COVID-19 immunizations that were responded RTC 6 months     This visit occurred during the SARS-CoV-2 public health emergency.  Safety protocols were in place, including screening questions prior to the visit, additional usage of staff PPE, and extensive cleaning of exam room while observing appropriate contact time as indicated for disinfecting solutions.

## 2019-09-09 NOTE — Progress Notes (Signed)
Pre visit review using our clinic review tool, if applicable. No additional management support is needed unless otherwise documented below in the visit note. 

## 2019-09-09 NOTE — Patient Instructions (Addendum)
Per our records you are due for an eye exam. Please contact your eye doctor to schedule an appointment. Please have them send copies of your office visit notes to [place]. Our fax number is [phone].    GO TO THE FRONT DESK Schedule your next appointment   for a checkup in 6 months

## 2019-09-10 NOTE — Assessment & Plan Note (Signed)
DM: per Dr Dwyane Dee HTN: Seems well controlled here at the office and reports good BPs at home.  Continue Maxide, Toprol, losartan, amlodipine. High cholesterol: On simvastatin, last FLP very good CAD: Asymptomatic COPD: To see pulmonary soon.  Essentially asymptomatic. ED: Needs to see urology again reference MUSE injections, referral sent Patient education: Has a number of questions about the COVID-19 immunizations that were responded RTC 6 months

## 2019-09-23 DIAGNOSIS — N5201 Erectile dysfunction due to arterial insufficiency: Secondary | ICD-10-CM | POA: Diagnosis not present

## 2019-10-14 ENCOUNTER — Ambulatory Visit: Payer: Medicare Other | Admitting: Internal Medicine

## 2019-10-30 ENCOUNTER — Ambulatory Visit (INDEPENDENT_AMBULATORY_CARE_PROVIDER_SITE_OTHER): Payer: Medicare Other

## 2019-10-30 ENCOUNTER — Other Ambulatory Visit: Payer: Self-pay

## 2019-10-30 ENCOUNTER — Ambulatory Visit (INDEPENDENT_AMBULATORY_CARE_PROVIDER_SITE_OTHER): Payer: Medicare Other | Admitting: Internal Medicine

## 2019-10-30 ENCOUNTER — Other Ambulatory Visit (INDEPENDENT_AMBULATORY_CARE_PROVIDER_SITE_OTHER): Payer: Medicare Other

## 2019-10-30 ENCOUNTER — Encounter: Payer: Self-pay | Admitting: Internal Medicine

## 2019-10-30 VITALS — BP 128/70 | HR 77 | Temp 97.9°F | Ht 72.0 in | Wt 240.0 lb

## 2019-10-30 DIAGNOSIS — J449 Chronic obstructive pulmonary disease, unspecified: Secondary | ICD-10-CM

## 2019-10-30 DIAGNOSIS — D751 Secondary polycythemia: Secondary | ICD-10-CM | POA: Diagnosis not present

## 2019-10-30 DIAGNOSIS — J9611 Chronic respiratory failure with hypoxia: Secondary | ICD-10-CM | POA: Diagnosis not present

## 2019-10-30 DIAGNOSIS — J9811 Atelectasis: Secondary | ICD-10-CM

## 2019-10-30 DIAGNOSIS — N289 Disorder of kidney and ureter, unspecified: Secondary | ICD-10-CM | POA: Diagnosis not present

## 2019-10-30 DIAGNOSIS — E1165 Type 2 diabetes mellitus with hyperglycemia: Secondary | ICD-10-CM | POA: Diagnosis not present

## 2019-10-30 LAB — COMPREHENSIVE METABOLIC PANEL
ALT: 21 U/L (ref 0–53)
AST: 18 U/L (ref 0–37)
Albumin: 4.1 g/dL (ref 3.5–5.2)
Alkaline Phosphatase: 59 U/L (ref 39–117)
BUN: 23 mg/dL (ref 6–23)
CO2: 27 mEq/L (ref 19–32)
Calcium: 9.5 mg/dL (ref 8.4–10.5)
Chloride: 104 mEq/L (ref 96–112)
Creatinine, Ser: 1.42 mg/dL (ref 0.40–1.50)
GFR: 57.8 mL/min — ABNORMAL LOW (ref >60.00)
Glucose, Bld: 133 mg/dL — ABNORMAL HIGH (ref 70–99)
Potassium: 3.9 mEq/L (ref 3.5–5.1)
Sodium: 140 mEq/L (ref 135–145)
Total Bilirubin: 0.6 mg/dL (ref 0.2–1.2)
Total Protein: 7.2 g/dL (ref 6.0–8.3)

## 2019-10-30 LAB — HEMOGLOBIN A1C: Hgb A1c MFr Bld: 7.5 % — ABNORMAL HIGH (ref 4.6–6.5)

## 2019-10-30 LAB — MICROALBUMIN / CREATININE URINE RATIO
Creatinine,U: 122.6 mg/dL
Microalb Creat Ratio: 0.6 mg/g (ref 0.0–30.0)
Microalb, Ur: <0.7 mg/dL (ref 0.0–1.9)

## 2019-10-30 NOTE — Patient Instructions (Signed)
Order- CXR   Dx atelectasis  We can continue current meds  Please call if we can help

## 2019-10-30 NOTE — Progress Notes (Signed)
HPI male former smoker followed for COPD, chronic respiratory failure with hypoxia, complicated by CAD/MI/ Stents/ PAD, DM2, polycythemia PFT- 07/10/11-  Normal spirometry flows with small- airway response to bronchodilator, normal lung volumes and moderately reduced diffusion. FEV1/FVC 0.79, DLCO 58% 6MWT -06/18/12- 96%, 88%, 94% 449 M. Significant desaturation with exercise. Overnight Oximetry 02/07/17-room air-sustained desaturation less than or equal to 88% for over 7 hours Walk Test 2019-  ------------------------------------------------------------------------------------------------------------   04/13/2019-  84 year old male former smoker followed for COPD, chronic respiratory failure with hypoxia,, complicated by CAD/MI/ stents/ PAD, DM2, polycythemia O2 2 L/ APS-sleep and exertion -----pt states breathing is at baseline; uses O2 2L qhs, DME: APS  POC 2l prn Denies edema cough or wheeze. ED for chest pain last month, relieved after acid blocker. Daily Stiolto. Rescue inhaler not more than once/ week.  Reviewed CXR suggesting fluid overload.  PFT 08/13/18- Diffusion moderately reduced. Normal spirometry flows and lung volumes CXR 03/05/19 1V-   IMPRESSION: Borderline cardiomegaly with volume overload.  10/30/19- 84 year old male former smoker (60 pk yr) followed for COPD, chronic respiratory failure with hypoxia,, complicated by CAD/MI/ stents/ PAD, DM2, Polycythemia O2 2 L/ APS-sleep and exertion Daily Stiolto 2.5. Rescue inhaler not more than once/ week, and not needed hfa in 3 months. Stiolto works well Last CXR showed RUL atelectasis in a former smoker. He denies cough, wheeze Has had flu vax and first covid vax. CXR 04/13/19-  Subsegmental atelectasis RIGHT upper lobe.  // Consider updating oximetry at rest, exertion and sleep next visit to make sure hypoxemia isn't sustaining polycythemia//  ROS-see HPI  + = positive Constitutional:   No-   weight loss, night sweats, fevers,  chills, fatigue, lassitude. HEENT:   No-  headaches, difficulty swallowing, tooth/dental problems, sore throat,       No-  sneezing, itching, ear ache, nasal congestion, post nasal drip,  CV:  No-   chest pain, orthopnea, PND, swelling in lower extremities, anasarca, dizziness, palpitations,                 Claudication R thigh Resp:   + "shortness of breath with exertion or at rest.              No-   productive cough,  No non-productive cough,  No- coughing up of blood.              No-   change in color of mucus.   wheezing.   Skin: No-   rash or lesions. GI:  No-   heartburn, indigestion, abdominal pain, nausea, vomiting, GU:  MS:  No-   joint pain or swelling.   Neuro-     nothing unusual Psych:  No- change in mood or affect. No depression or anxiety.  No memory loss.  OBJ- Physical Exam     General- Alert, Oriented, Affect-appropriate, Distress- none acute.  Skin- rash-none, lesions- none, excoriation- none Lymphadenopathy-  Head- atraumatic            Eyes- Gross vision intact, PERRLA, conjunctivae and secretions clear            Ears- Hearing, canals-normal            Nose- Clear, no-Septal dev, mucus, polyps, erosion, perforation             Throat- Mallampati II , mucosa clear , drainage- none, tonsils- atrophic Neck- flexible , trachea midline, no stridor , thyroid nl, carotid no bruit Chest - symmetrical excursion , unlabored  Heart/CV- RRR , no murmur , no gallop  , no rub, nl s1 s2                           - JVD none , edema- none, stasis changes- none, varices- none           Lung- +clear, cough-none , dullness-none, rub- none,            Chest wall-  Abd-  Br/ Gen/ Rectal- Not done, not indicated Extrem- cyanosis- none,  atrophy- none, strength- nl  Neuro- grossly intact to observation

## 2019-10-31 NOTE — Assessment & Plan Note (Signed)
Hemoglobin 17.5 in December. Followed by Heme/ Onc.

## 2019-10-31 NOTE — Assessment & Plan Note (Addendum)
He continues to sleep with O2 2L. No change Plan- consider updating oximetry next visit to ensure he isn't staying hypoxic to perpetuate polycythemia

## 2019-10-31 NOTE — Assessment & Plan Note (Signed)
Well controlled with nor recent exacerbation Plan- continue current meds

## 2019-11-02 ENCOUNTER — Telehealth: Payer: Self-pay | Admitting: Internal Medicine

## 2019-11-02 NOTE — Telephone Encounter (Signed)
CXR- clear. The questionable area on the last previous xray has resolved. No concern. ---------- Spoke with pt, aware of results.  Nothing further needed at this time- will close encounter.

## 2019-11-05 ENCOUNTER — Ambulatory Visit: Payer: Medicare Other | Admitting: Endocrinology

## 2019-11-06 ENCOUNTER — Other Ambulatory Visit: Payer: Self-pay

## 2019-11-06 ENCOUNTER — Encounter: Payer: Self-pay | Admitting: Medical

## 2019-11-06 ENCOUNTER — Ambulatory Visit (INDEPENDENT_AMBULATORY_CARE_PROVIDER_SITE_OTHER): Payer: Medicare Other | Admitting: Medical

## 2019-11-06 VITALS — BP 112/70 | HR 75 | Temp 97.0°F | Resp 12 | Ht 72.0 in | Wt 237.8 lb

## 2019-11-06 DIAGNOSIS — M272 Inflammatory conditions of jaws: Secondary | ICD-10-CM

## 2019-11-06 MED ORDER — CEFTRIAXONE SODIUM 1 G IJ SOLR
1.0000 g | Freq: Once | INTRAMUSCULAR | Status: AC
  Administered 2019-11-06: 1 g via INTRAMUSCULAR

## 2019-11-06 MED ORDER — AMOXICILLIN-POT CLAVULANATE 875-125 MG PO TABS: 1.0000 | ORAL_TABLET | Freq: Two times a day (BID) | ORAL | 0 refills | Status: DC

## 2019-11-06 NOTE — Patient Instructions (Signed)
You do appear to have recent jaw pain due to rt lower side tooth infection in region of back side tooth. That tooth has large filling. We gave you rocephin 1 gram im. Also rx'd augmentin oral antibiotic to start tomorrow morning. Please call [facility] dentist and see if they can see you next week.   If appointment delayed then recommend [facility] not associated [facility] as sometimes tooth infection can lead to complications.  If any severe change in signs/symptoms before dentis appointment then be seen by ED

## 2019-11-06 NOTE — Addendum Note (Signed)
Addended by: Kem Boroughs D on: 11/06/2019 03:21 PM   Modules accepted: Orders

## 2019-11-06 NOTE — Progress Notes (Signed)
   Subjective:    Patient ID: Jonathan Leather., male    DOB: [birth year], 84 y.o.   MRN: [id number]  HPI  Pt states he had some neck pain in rt submandibular area and some rt side gum area pain that was present 2 days. Pt states he had pain when he ate.   Pt never chewed tobacco. Pt stopped smoking in 1978. He smoked about 35 years before he quit. He was pack a day smoker.   He had not fever, no chills or sweats.  Pt goes to [place]. He was supposed to [place] but appointment.       Review of Systems     Objective:   Physical Exam  General  Mental Status - Alert. General Appearance - Well groomed. Not in acute distress.  Skin Rashes- No Rashes.  HEENT Head- Normal. Ear Auditory Canal - Left- Normal. Right - Normal.Tympanic Membrane- Left- Normal. Right- Normal. Eye Sclera/Conjunctiva- Left- Normal. Right- Normal. Nose & Sinuses Nasal Mucosa- Left-  Boggy and Congested. Right-  Boggy and  Congested.Bilateral maxillary and frontal sinus pressure. Mouth & Throat Lips: Upper Lip- Normal: no dryness, cracking, pallor, cyanosis, or vesicular eruption. Lower Lip-Normal: no dryness, cracking, pallor, cyanosis or vesicular eruption. Buccal Mucosa- Bilateral- No Aphthous ulcers. Oropharynx- No Discharge or Erythema. Tonsils: Characteristics- Bilateral- No Erythema or Congestion. Size/Enlargement- Bilateral- No enlargement. Discharge- bilateral-None. Mouth- rt lower side beneath tooth very swollen gum and tender. Under tooth with filling. Rt mandible- no tender to palpation. Mild submandibular node faint swollen.  Neck Neck- Supple. No Masses.   Chest and Lung Exam Auscultation: Breath Sounds:-Clear even and unlabored.  Cardiovascular Auscultation:Rythm- Regular, rate and rhythm. Murmurs & Other Heart Sounds:Ausculatation of the heart reveal- No Murmurs.  Lymphatic Head & Neck General Head & Neck Lymphatics: Bilateral: Description- No Localized lymphadenopathy.       Assessment & Plan:  You do appear to have recent jaw pain due to rt lower side tooth infection in region of back side tooth. That tooth has large filling. We gave you rocephin 1 gram im. Also rx'd augmentin oral antibiotic to start tomorrow morning. Please call [facility] dentist and see if they can see you next week.   If appointment delayed then recommend [facility] not [facility] as sometimes tooth infection can lead to complications.  If any severe change in signs/symptoms before dentis appointment then be seen by ED  20 minutes spent with pt.  Mackie Pai, PA-C

## 2019-11-13 LAB — HM DIABETES EYE EXAM

## 2019-11-19 ENCOUNTER — Inpatient Hospital Stay (HOSPITAL_BASED_OUTPATIENT_CLINIC_OR_DEPARTMENT_OTHER): Payer: Medicare Other | Admitting: Hematology

## 2019-11-19 ENCOUNTER — Other Ambulatory Visit: Payer: Self-pay

## 2019-11-19 ENCOUNTER — Inpatient Hospital Stay: Payer: Medicare Other | Attending: Hematology

## 2019-11-19 ENCOUNTER — Encounter: Payer: Self-pay | Admitting: Hematology

## 2019-11-19 ENCOUNTER — Telehealth: Payer: Self-pay | Admitting: Hematology

## 2019-11-19 VITALS — BP 116/69 | HR 74 | Temp 97.1°F | Resp 18 | Ht 72.0 in | Wt 236.0 lb

## 2019-11-19 DIAGNOSIS — J449 Chronic obstructive pulmonary disease, unspecified: Secondary | ICD-10-CM

## 2019-11-19 DIAGNOSIS — D751 Secondary polycythemia: Secondary | ICD-10-CM | POA: Diagnosis not present

## 2019-11-19 DIAGNOSIS — Z79899 Other long term (current) drug therapy: Secondary | ICD-10-CM | POA: Insufficient documentation

## 2019-11-19 DIAGNOSIS — J9611 Chronic respiratory failure with hypoxia: Secondary | ICD-10-CM | POA: Diagnosis not present

## 2019-11-19 DIAGNOSIS — Z7984 Long term (current) use of oral hypoglycemic drugs: Secondary | ICD-10-CM | POA: Insufficient documentation

## 2019-11-19 DIAGNOSIS — Z7982 Long term (current) use of aspirin: Secondary | ICD-10-CM | POA: Insufficient documentation

## 2019-11-19 LAB — CMP (CANCER CENTER ONLY)
ALT: 20 U/L (ref 0–44)
AST: 18 U/L (ref 15–41)
Albumin: 4.3 g/dL (ref 3.5–5.0)
Alkaline Phosphatase: 55 U/L (ref 38–126)
Anion gap: 8 (ref 5–15)
BUN: 19 mg/dL (ref 8–23)
CO2: 30 mmol/L (ref 22–32)
Calcium: 9.9 mg/dL (ref 8.9–10.3)
Chloride: 101 mmol/L (ref 98–111)
Creatinine: 1.56 mg/dL — ABNORMAL HIGH (ref 0.61–1.24)
GFR, Est AFR Am: 48 mL/min — ABNORMAL LOW (ref >60)
GFR, Estimated: 41 mL/min — ABNORMAL LOW (ref >60)
Glucose, Bld: 171 mg/dL — ABNORMAL HIGH (ref 70–99)
Potassium: 4.3 mmol/L (ref 3.5–5.1)
Sodium: 139 mmol/L (ref 135–145)
Total Bilirubin: 0.8 mg/dL (ref 0.3–1.2)
Total Protein: 7.4 g/dL (ref 6.5–8.1)

## 2019-11-19 LAB — CBC WITH DIFFERENTIAL (CANCER CENTER ONLY)
Abs Immature Granulocytes: 0.03 10*3/uL (ref 0.00–0.07)
Basophils Absolute: 0.1 10*3/uL (ref 0.0–0.1)
Basophils Relative: 1 %
Eosinophils Absolute: 0.3 10*3/uL (ref 0.0–0.5)
Eosinophils Relative: 4 %
HCT: 53.2 % — ABNORMAL HIGH (ref 39.0–52.0)
Hemoglobin: 17.8 g/dL — ABNORMAL HIGH (ref 13.0–17.0)
Immature Granulocytes: 0 %
Lymphocytes Relative: 38 %
Lymphs Abs: 3.1 10*3/uL (ref 0.7–4.0)
MCH: 32.7 pg (ref 26.0–34.0)
MCHC: 33.5 g/dL (ref 30.0–36.0)
MCV: 97.8 fL (ref 80.0–100.0)
Monocytes Absolute: 0.7 10*3/uL (ref 0.1–1.0)
Monocytes Relative: 9 %
Neutro Abs: 4 10*3/uL (ref 1.7–7.7)
Neutrophils Relative %: 48 %
Platelet Count: 220 10*3/uL (ref 150–400)
RBC: 5.44 MIL/uL (ref 4.22–5.81)
RDW: 13.6 % (ref 11.5–15.5)
WBC Count: 8.1 10*3/uL (ref 4.0–10.5)
nRBC: 0 % (ref 0.0–0.2)

## 2019-11-19 LAB — SAVE SMEAR(SSMR), FOR PROVIDER SLIDE REVIEW

## 2019-11-19 LAB — LACTATE DEHYDROGENASE: LDH: 177 U/L (ref 98–192)

## 2019-11-19 NOTE — Progress Notes (Signed)
[facility] OFFICE PROGRESS NOTE  Patient Care Team: Colon Branch, MD as PCP - General Dorothy Spark, MD as PCP - Cardiology (Cardiology) Elayne Snare, MD as Consulting Physician (Endocrinology) Deneise Lever, MD as Consulting Physician (Pulmonary Disease) Clent Jacks, MD as Consulting Physician (Ophthalmology) Camelia Phenes, DPM (Inactive) as Consulting Physician (Podiatry) Dorothy Spark, MD as Consulting Physician (Cardiology) Carolan Clines, MD (Inactive) as Consulting Physician (Urology) Juanita Craver, MD as Consulting Physician (Gastroenterology)  HEME/ONC OVERVIEW: 1. Chronic polycythemia -Secondary to COPD; testosterone cream discontinued in 08/2018   Hgb between 17 and 18 dating back to 2012    Epo level 12.3 (c/w secondary polycythemia); iron profile normal; MPN NGS negative   PERTINENT NON-HEM/ONC PROBLEMS 1. Chronic hypoxic respiratory failure on supplement O2 due to COPD  2. Hx of hypogonadism on testosterone cream for many years, stopped in late 2019   ASSESSMENT & PLAN:   Secondary polycythemia -Secondary to chronic hypoxia from underlying lung disease  -Exogenous testosterone cream discontinued in 08/2018  -Hgb 17.8 today, stable  -Clinically, patient denies any symptoms associated with polycythemia, such as pruritus, rash, or any symptoms of VTE  -No indication for phlebotomy in the setting of secondary polycythemia, as phlebotomy can exacerbate underlying conditions, such as chronic respiratory failure -I also encouraged the patient to wear supplemental O2 at night (as instructed by pulmonary medicine) due to hypoxia during sleep, which can cause persistently elevated hemoglobin   COPD with Chronic hypoxic respiratory failure  -FTC 07/2018 showed severe diffusion restriction minimal obstruction -Continue supplemental O2 at night and follow-up with [facility] pulmonology   No orders of the defined types were placed in this  encounter.  The total time spent in the encounter was 25 minutes, including face-to-face time with the patient, review of various tests results, order additional studies/medications, documentation, and coordination of care plan.   All questions were answered. The patient knows to call the clinic with any problems, questions or concerns. No barriers to learning was detected.  Return as needed.   Tish Men, MD 3/4/20219:56 AM  CHIEF COMPLAINT: "I am doing fine"  INTERVAL HISTORY: Jonathan [name] returns clinic for follow-up on secondary polycythemia from COPD.  Patient reports that he is prescribed supplemental O2 at night for sleep, but he does not wear it consistently.  He has not resumed testosterone cream since 08/2018.  He does not use supplemental O2 during the day.  He denies any other new complaint today.  REVIEW OF SYSTEMS:   Constitutional: ( - ) fevers, ( - )  chills , ( - ) night sweats Eyes: ( - ) blurriness of vision, ( - ) double vision, ( - ) watery eyes Ears, nose, mouth, throat, and face: ( - ) mucositis, ( - ) sore throat Respiratory: ( - ) cough, ( - ) dyspnea, ( - ) wheezes Cardiovascular: ( - ) palpitation, ( - ) chest discomfort, ( - ) lower extremity swelling Gastrointestinal:  ( - ) nausea, ( - ) heartburn, ( - ) change in bowel habits Skin: ( - ) abnormal skin rashes Lymphatics: ( - ) new lymphadenopathy, ( - ) easy bruising Neurological: ( - ) numbness, ( - ) tingling, ( - ) new weaknesses Behavioral/Psych: ( - ) mood change, ( - ) new changes  All other systems were reviewed with the patient and are negative.  SUMMARY OF ONCOLOGIC HISTORY: Oncology History   No history exists.    I have reviewed the  past medical history, past surgical history, social history and family history with the patient and they are unchanged from previous note.  ALLERGIES:  has No Known Allergies.  MEDICATIONS:  Current Outpatient Medications  Medication Sig Dispense Refill  .  albuterol (PROVENTIL HFA) 108 (90 Base) MCG/ACT inhaler Inhale 2 puffs into the lungs every 6 (six) hours as needed for wheezing or shortness of breath.    Marland Kitchen amLODipine (NORVASC) 10 MG tablet TAKE 1 TABLET DAILY 90 tablet 3  . amoxicillin-clavulanate (AUGMENTIN) 875-125 MG tablet Take 1 tablet by mouth 2 (two) times daily. 20 tablet 0  . aspirin 81 MG tablet Take 81 mg by mouth daily.      . Blood Glucose Monitoring Suppl (FREESTYLE FREEDOM LITE) w/Device KIT Use Freestyle Freedom Lite meter to check blood sugar twice daily. DX:E11.65 1 kit 0  . Dulaglutide (TRULICITY) 3 ZO/1.0RU SOPN Inject 3 mg into the skin once a week. 12 pen 1  . empagliflozin (JARDIANCE) 25 MG TABS tablet Take 12.5 mg by mouth daily.    . famotidine (PEPCID) 20 MG tablet Take 1 tablet (20 mg total) by mouth 2 (two) times daily. 60 tablet 0  . gabapentin (NEURONTIN) 100 MG capsule Take 200-300 mg by mouth at bedtime as needed.    Marland Kitchen glucose blood (FREESTYLE LITE) test strip USE TO CHECK BLOOD SUGAR TWICE A DAY AS INSTRUCTED 100 each 6  . Lancets (FREESTYLE) lancets USE AS INSTRUCTED TO CHECK BLOOD SUGAR TWICE A DAY 200 each 1  . losartan (COZAAR) 100 MG tablet Take 1 tablet (100 mg total) by mouth daily. 90 tablet 1  . metFORMIN (GLUCOPHAGE) 1000 MG tablet Take 1,000 mg by mouth daily with breakfast.     . metoprolol succinate (TOPROL XL) 100 MG 24 hr tablet Take 1 tablet (100 mg total) by mouth daily. 90 tablet 2  . Multiple Vitamin (MULTIVITAMIN WITH MINERALS) TABS tablet Take 1 tablet by mouth daily.    . simvastatin (ZOCOR) 20 MG tablet Take 20 mg by mouth at bedtime.     . Tiotropium Bromide-Olodaterol (STIOLTO RESPIMAT) 2.5-2.5 MCG/ACT AERS Inhale 2 puffs into the lungs daily as needed (for flares).     . triamterene-hydrochlorothiazide (MAXZIDE-25) 37.5-25 MG tablet Take 1 tablet by mouth daily.      No current facility-administered medications for this visit.    PHYSICAL EXAMINATION: ECOG PERFORMANCE STATUS: 1 -  Symptomatic but completely ambulatory  Today's Vitals   11/19/19 0947  BP: 116/69  Pulse: 74  Resp: 18  Temp: (!) 97.1 F (36.2 C)  TempSrc: Temporal  SpO2: 98%  Weight: 236 lb (107 kg)  Height: 6' (1.829 m)  PainSc: 0-No pain   Body mass index is 32.01 kg/m.  Filed Weights   11/19/19 0947  Weight: 236 lb (107 kg)    GENERAL: alert, no distress and comfortable SKIN: skin color, texture, turgor are normal, no rashes or significant lesions EYES: conjunctiva are pink and non-injected, sclera clear OROPHARYNX: no exudate, no erythema; lips, buccal mucosa, and tongue normal  NECK: supple, non-tender LUNGS: clear to auscultation with normal breathing effort HEART: regular rate & rhythm and no murmurs and no lower extremity edema ABDOMEN: soft, non-tender, non-distended, normal bowel sounds Musculoskeletal: no cyanosis of digits and no clubbing  PSYCH: alert & oriented x 3, fluent speech  LABORATORY DATA:  I have reviewed the data as listed    Component Value Date/Time   NA 140 10/30/2019 0809   K 3.9 10/30/2019 0809  CL 104 10/30/2019 0809   CO2 27 10/30/2019 0809   GLUCOSE 133 (H) 10/30/2019 0809   BUN 23 10/30/2019 0809   CREATININE 1.42 10/30/2019 0809   CREATININE 1.40 (H) 05/21/2019 0919   CALCIUM 9.5 10/30/2019 0809   PROT 7.2 10/30/2019 0809   ALBUMIN 4.1 10/30/2019 0809   AST 18 10/30/2019 0809   AST 18 05/21/2019 0919   ALT 21 10/30/2019 0809   ALT 24 05/21/2019 0919   ALKPHOS 59 10/30/2019 0809   BILITOT 0.6 10/30/2019 0809   BILITOT 0.7 05/21/2019 0919   GFRNONAA 47 (L) 05/21/2019 0919   GFRAA 54 (L) 05/21/2019 0919    No results found for: SPEP, UPEP  Lab Results  Component Value Date   WBC 8.1 11/19/2019   NEUTROABS 4.0 11/19/2019   HGB 17.8 (H) 11/19/2019   HCT 53.2 (H) 11/19/2019   MCV 97.8 11/19/2019   PLT 220 11/19/2019      Chemistry      Component Value Date/Time   NA 140 10/30/2019 0809   K 3.9 10/30/2019 0809   CL 104  10/30/2019 0809   CO2 27 10/30/2019 0809   BUN 23 10/30/2019 0809   CREATININE 1.42 10/30/2019 0809   CREATININE 1.40 (H) 05/21/2019 0919      Component Value Date/Time   CALCIUM 9.5 10/30/2019 0809   ALKPHOS 59 10/30/2019 0809   AST 18 10/30/2019 0809   AST 18 05/21/2019 0919   ALT 21 10/30/2019 0809   ALT 24 05/21/2019 0919   BILITOT 0.6 10/30/2019 0809   BILITOT 0.7 05/21/2019 0919       RADIOGRAPHIC STUDIES: I have personally reviewed the radiological images as listed below and agreed with the findings in the report. DG Chest 2 View  Result Date: 10/30/2019 CLINICAL DATA:  COPD and atelectasis follow-up. EXAM: CHEST - 2 VIEW COMPARISON:  04/13/2019 and prior studies FINDINGS: The cardiomediastinal silhouette is unremarkable. Mild chronic peribronchial thickening again noted. There is no evidence of focal airspace disease, pulmonary edema, suspicious pulmonary nodule/mass, pleural effusion, or pneumothorax. No acute bony abnormalities are identified. IMPRESSION: No active cardiopulmonary disease. Electronically Signed   By: Margarette Canada M.D.   On: 10/30/2019 11:56

## 2019-11-19 NOTE — Telephone Encounter (Signed)
Per 3/4 los Return as needed

## 2019-12-08 ENCOUNTER — Other Ambulatory Visit: Payer: Self-pay | Admitting: Endocrinology

## 2019-12-09 ENCOUNTER — Other Ambulatory Visit: Payer: Medicare Other

## 2019-12-15 NOTE — Progress Notes (Signed)
Patient ID: Jonathan Leather., male   DOB: 12-05-1935, 84 y.o.   MRN: [id number]    Reason for Appointment:  Follow-up  History of Present Illness:          Diagnosis: Type 2 diabetes mellitus, date of diagnosis:  2009      Past history: He is not clear what his diabetes was diagnosed but according to hospital records it may have been in 2009. Most likely he was placed on metformin initially and at some point Amaryl added. In 2013 it was also given Januvia presumably to improve his control. However appears that his A1c has been consistently over 7% Janumet was started instead of metformin and Januvia separately in 2013  He was started on Trulicity in 9/24 instead of Victoza because excessive bruising on his abdomen  Later in 01/2015 he was switched to Bydureon because of insurance noncoverage of his Trulicity  Recent history:   Non-insulin hypoglycemic drugs are: Metformin 1000 twice a day, Trulicity 3.0  mg weekly, Jardiance 25 mg daily  His A1c on the last visit was relatively high at 7.9 and is now 7.5  Fructosamine previously 284  Current blood sugar patterns and problems:  He was given a new meter but he still appears to have lower readings than expected for his A1c  His postprandial lab glucose recently was 171  Although he is checking some readings in the mornings and evenings both he is mostly checking readings before meals and not clear if some of the evening readings are after eating  He now says that he is mostly eating 2 meals a day and may skip lunch  However sometimes he may have a higher fat meal in the morning including croissants and meat  Not doing any formal exercise as he thinks he is otherwise active working at home and taking care of his wife  He has been on 3 mg TRULICITY since his last visit  He thinks this is making him lose weight with his clothes fitting loose  However he appears to have lost only 4 pounds  No hypoglycemia  May skip  lunch    Side effects from medications have been: None  Glucose monitoring:  done  Once a day       Glucometer:  FreeStyle Blood Glucose readings  by download    PRE-MEAL Fasting Lunch Dinner Bedtime Overall  Glucose range:  87-128   98-134    Mean/median:  113     115   POST-MEAL PC Breakfast PC Lunch PC Dinner  Glucose range:   101, 113  139, 145  Mean/median:      PREVIOUS readings:  PRE-MEAL Fasting Lunch Dinner  overnight Overall  Glucose range:  111-161    128  111-184  Mean/median:  132    137   POST-MEAL PC Breakfast PC Lunch PC Dinner  Glucose range:    120-184  Mean/median:           Meals: 3 meals per day. eating egg/meat bread for breakfast, dinner 6-7 PM     Dietician visit: Most recent: 4/16 .               Weight history:  Wt Readings from Last 3 Encounters:  12/16/19 236 lb (107 kg)  11/19/19 236 lb (107 kg)  11/06/19 237 lb 12.8 oz (107.9 kg)   Glycemic control:   Lab Results  Component Value Date   HGBA1C 7.5 (H) 10/30/2019   HGBA1C  7.9 (H) 06/30/2019   HGBA1C 7.1 (H) 03/31/2019   Lab Results  Component Value Date   MICROALBUR <0.7 10/30/2019   LDLCALC 62 06/30/2019   CREATININE 1.56 (H) 11/19/2019    Lab Results  Component Value Date   FRUCTOSAMINE 284 08/26/2019   FRUCTOSAMINE 284 03/29/2017   FRUCTOSAMINE 268 12/06/2016    OTHER active problems addressed today: See review of systems  No visits with results within 1 Week(s) from this visit.  Latest known visit with results is:  Appointment on 11/19/2019  Component Date Value Ref Range Status  . Smear Review 11/19/2019 SMEAR STAINED AND AVAILABLE FOR REVIEW   Final   Performed at [facility] at [facility], [address], [place], [place] [address]  . LDH 11/19/2019 177  98 - 192 U/L Final   Performed at [facility], [place] [address] [place], [place] [address]  . Sodium 11/19/2019 139  135 - 145 mmol/L Final  . Potassium  11/19/2019 4.3  3.5 - 5.1 mmol/L Final  . Chloride 11/19/2019 101  98 - 111 mmol/L Final  . CO2 11/19/2019 30  22 - 32 mmol/L Final  . Glucose, Bld 11/19/2019 171* 70 - 99 mg/dL Final   Glucose reference range applies only to samples taken after fasting for at least 8 hours.  . BUN 11/19/2019 19  8 - 23 mg/dL Final  . Creatinine 11/19/2019 1.56* 0.61 - 1.24 mg/dL Final  . Calcium 11/19/2019 9.9  8.9 - 10.3 mg/dL Final  . Total Protein 11/19/2019 7.4  6.5 - 8.1 g/dL Final  . Albumin 11/19/2019 4.3  3.5 - 5.0 g/dL Final  . AST 11/19/2019 18  15 - 41 U/L Final  . ALT 11/19/2019 20  0 - 44 U/L Final  . Alkaline Phosphatase 11/19/2019 55  38 - 126 U/L Final  . Total Bilirubin 11/19/2019 0.8  0.3 - 1.2 mg/dL Final  . GFR, Est Non Af Am 11/19/2019 41* >60 mL/min Final  . GFR, Est AFR Am 11/19/2019 48* >60 mL/min Final  . Anion gap 11/19/2019 8  5 - 15 Final   Performed at [facility] Lab at [facility], [address], [place], [place] [address]  . WBC Count 11/19/2019 8.1  4.0 - 10.5 K/uL Final  . RBC 11/19/2019 5.44  4.22 - 5.81 MIL/uL Final  . Hemoglobin 11/19/2019 17.8* 13.0 - 17.0 g/dL Final  . HCT 11/19/2019 53.2* 39.0 - 52.0 % Final  . MCV 11/19/2019 97.8  80.0 - 100.0 fL Final  . MCH 11/19/2019 32.7  26.0 - 34.0 pg Final  . MCHC 11/19/2019 33.5  30.0 - 36.0 g/dL Final  . RDW 11/19/2019 13.6  11.5 - 15.5 % Final  . Platelet Count 11/19/2019 220  150 - 400 K/uL Final  . nRBC 11/19/2019 0.0  0.0 - 0.2 % Final  . Neutrophils Relative % 11/19/2019 48  % Final  . Neutro Abs 11/19/2019 4.0  1.7 - 7.7 K/uL Final  . Lymphocytes Relative 11/19/2019 38  % Final  . Lymphs Abs 11/19/2019 3.1  0.7 - 4.0 K/uL Final  . Monocytes Relative 11/19/2019 9  % Final  . Monocytes Absolute 11/19/2019 0.7  0.1 - 1.0 K/uL Final  . Eosinophils Relative 11/19/2019 4  % Final  . Eosinophils Absolute 11/19/2019 0.3  0.0 - 0.5 K/uL Final  . Basophils Relative 11/19/2019 1  % Final  .  Basophils Absolute 11/19/2019 0.1  0.0 - 0.1 K/uL Final  . Immature Granulocytes 11/19/2019 0  % Final  . Abs Immature Granulocytes 11/19/2019 0.03  0.00 - 0.07 K/uL Final   Performed at [facility] at [facility], [address] [place], [place] [address]     Allergies as of 12/16/2019   No Known Allergies     Medication List       Accurate as of December 16, 2019  8:16 AM. If you have any questions, ask your nurse or doctor.        STOP taking these medications   amoxicillin-clavulanate 875-125 MG tablet Commonly known as: Augmentin Stopped by: Elayne Snare, MD   famotidine 20 MG tablet Commonly known as: Pepcid Stopped by: Elayne Snare, MD     TAKE these medications   amLODipine 10 MG tablet Commonly known as: NORVASC TAKE 1 TABLET DAILY   aspirin 81 MG tablet Take 81 mg by mouth daily.   FreeStyle Freedom Lite w/Device Kit Use Freestyle Freedom Lite meter to check blood sugar twice daily. DX:E11.65   freestyle lancets USE AS INSTRUCTED TO CHECK BLOOD SUGAR TWICE A DAY   FREESTYLE LITE test strip Generic drug: glucose blood USE TO CHECK BLOOD SUGAR TWICE A DAY AS INSTRUCTED   gabapentin 100 MG capsule Commonly known as: NEURONTIN Take 200-300 mg by mouth at bedtime as needed.   Jardiance 25 MG Tabs tablet Generic drug: empagliflozin Take 12.5 mg by mouth daily.   losartan 100 MG tablet Commonly known as: COZAAR Take 1 tablet (100 mg total) by mouth daily.   metFORMIN 1000 MG tablet Commonly known as: GLUCOPHAGE Take 1,000 mg by mouth daily with breakfast.   metoprolol succinate 100 MG 24 hr tablet Commonly known as: Toprol XL Take 1 tablet (100 mg total) by mouth daily.   multivitamin with minerals Tabs tablet Take 1 tablet by mouth daily.   Proventil HFA 108 (90 Base) MCG/ACT inhaler Generic drug: albuterol Inhale 2 puffs into the lungs every 6 (six) hours as needed for wheezing or shortness of breath.   simvastatin 20  MG tablet Commonly known as: ZOCOR Take 20 mg by mouth at bedtime.   Stiolto Respimat 2.5-2.5 MCG/ACT Aers Generic drug: Tiotropium Bromide-Olodaterol Inhale 2 puffs into the lungs daily as needed (for flares).   triamterene-hydrochlorothiazide 37.5-25 MG tablet Commonly known as: MAXZIDE-25 Take 1 tablet by mouth daily.   Trulicity 3 TK/1.6WF Sopn Generic drug: Dulaglutide Inject 3 mg into the skin once a week.       Allergies: No Known Allergies  Past Medical History:  Diagnosis Date  . CAD (coronary artery disease)    Two RCA stents remotely / 3rd RCA stent 2006  . Colon polyps    s/p several Cscopes.  Mar[place] Kitchen COPD (chronic obstructive pulmonary disease) (HCC)    on O2, nocturnal  . Diabetes mellitus    dx aprox 2009  . ED (erectile dysfunction)    has a vacumm device  . Ejection fraction   . Hyperlipidemia    dx in 90s  . Hypertension    dx in the 90  . Hypogonadism male   . PVD (peripheral vascular disease) ([place])    s/p stents at LE 2009, Dr Einar Gip  . Shortness of breath    O2 Sat dropped to 82% walking on the treadmill, September, 2012    Past Surgical History:  Procedure Laterality Date  . APPENDECTOMY    . TONSILLECTOMY  Family History  Problem Relation Age of Onset  . Heart disease Father   . Hypertension Father   . Stroke Father   . Diabetes Paternal Aunt   . Diabetes Maternal Grandmother   . Diabetes Other        GM, nephews, many family members  . Hyperlipidemia Other        ?  . Prostate cancer Brother   . Colon cancer Neg Hx     Social History:  reports that he quit smoking about 42 years ago. His smoking use included cigarettes. He has a 60.00 pack-year smoking history. He has never used smokeless tobacco. He reports current alcohol use. He reports that he does not use drugs.    Review of Systems        Lipids: He has been on treatment with simvastatin 20 mg with good control as follows       Lab Results  Component Value Date    CHOL 118 06/30/2019   HDL 37.40 (L) 06/30/2019   LDLCALC 62 06/30/2019   LDLDIRECT 55.0 01/01/2018   TRIG 94.0 06/30/2019   CHOLHDL 3 06/30/2019                   HYPOGONADISM He had decreased libido and erectile dysfunction previously and was found to have hypogonadism, probably in 2011. He did have a slightly low free testosterone level in 2012 on treatment Prolactin level normal  Previously on AndroGel With his hemoglobin going up to 19.1 in August 2019 he has been told not to take any testosterone  Testosterone level is relatively low without supplements Again do not think he feels unusually tired   Lab Results  Component Value Date   TESTOSTERONE 210.21 (L) 06/30/2019   TESTOSTERONE 280.49 (L) 03/31/2019   TESTOSTERONE 328 08/29/2018     He has been evaluated by hematologist for his erythrocytosis, no recommendations on follow-up appointment made   CBC Latest Ref Rng & Units 11/19/2019 08/26/2019 05/21/2019  WBC 4.0 - 10.5 K/uL 8.1 8.0 8.4  Hemoglobin 13.0 - 17.0 g/dL 17.8(H) 17.5(H) 18.0(H)  Hematocrit 39.0 - 52.0 % 53.2(H) 52.5(H) 53.1(H)  Platelets 150 - 400 K/uL 220 188.0 181          HYPERTENSION: This is followed by PCP and is on 156m of losartan along with half Maxide, amlodipine and metoprolol   He has been checking blood pressure at home No dizziness  BP Readings from Last 3 Encounters:  12/16/19 120/70  11/19/19 116/69  11/06/19 112/70   RENAL dysfunction: His creatinine is relatively higher Does not have nephropathy from diabetes Blood pressure has been lower normal and he was told to reduce his losartan to half tablet previously but he forgot to do so  Lab Results  Component Value Date   CREATININE 1.56 (H) 11/19/2019   CREATININE 1.42 10/30/2019   CREATININE 1.54 (H) 08/26/2019     LABS:  No visits with results within 1 Week(s) from this visit.  Latest known visit with results is:  Appointment on 11/19/2019  Component Date Value Ref  Range Status  . Smear Review 11/19/2019 SMEAR STAINED AND AVAILABLE FOR REVIEW   Final   [facility] [address] [place] [id number] . LDH 11/19/2019 177  98 - 192 U/L Final   [facility] at [facility], [address] [place] [address] . Sodium 11/19/2019 139  135 -  145 mmol/L Final  . Potassium 11/19/2019 4.3  3.5 - 5.1 mmol/L Final  . Chloride 11/19/2019 101  98 - 111 mmol/L Final  . CO2 11/19/2019 30  22 - 32 mmol/L Final  . Glucose, Bld 11/19/2019 171* 70 - 99 mg/dL Final   Glucose reference range applies only to samples taken after fasting for at least 8 hours.  . BUN 11/19/2019 19  8 - 23 mg/dL Final  . Creatinine 11/19/2019 1.56* 0.61 - 1.24 mg/dL Final  . Calcium 11/19/2019 9.9  8.9 - 10.3 mg/dL Final  . Total Protein 11/19/2019 7.4  6.5 - 8.1 g/dL Final  . Albumin 11/19/2019 4.3  3.5 - 5.0 g/dL Final  . AST 11/19/2019 18  15 - 41 U/L Final  . ALT 11/19/2019 20  0 - 44 U/L Final  . Alkaline Phosphatase 11/19/2019 55  38 - 126 U/L Final  . Total Bilirubin 11/19/2019 0.8  0.3 - 1.2 mg/dL Final  . GFR, Est Non Af Am 11/19/2019 41* >60 mL/min Final  . GFR, Est AFR Am 11/19/2019 48* >60 mL/min Final  . Anion gap 11/19/2019 8  5 - 15 Final   Performed at [facility] at [facility], [address] [place], [place] [address]  . WBC Count 11/19/2019 8.1  4.0 - 10.5 K/uL Final  . RBC 11/19/2019 5.44  4.22 - 5.81 MIL/uL Final  . Hemoglobin 11/19/2019 17.8* 13.0 - 17.0 g/dL Final  . HCT 11/19/2019 53.2* 39.0 - 52.0 % Final  . MCV 11/19/2019 97.8  80.0 - 100.0 fL Final  . MCH 11/19/2019 32.7  26.0 - 34.0 pg Final  . MCHC 11/19/2019 33.5  30.0 - 36.0 g/dL Final  . RDW 11/19/2019 13.6  11.5 - 15.5 % Final  . Platelet Count 11/19/2019 220  150 - 400 K/uL Final  . nRBC 11/19/2019 0.0  0.0 - 0.2 % Final  . Neutrophils Relative % 11/19/2019 48  % Final  .  Neutro Abs 11/19/2019 4.0  1.7 - 7.7 K/uL Final  . Lymphocytes Relative 11/19/2019 38  % Final  . Lymphs Abs 11/19/2019 3.1  0.7 - 4.0 K/uL Final  . Monocytes Relative 11/19/2019 9  % Final  . Monocytes Absolute 11/19/2019 0.7  0.1 - 1.0 K/uL Final  . Eosinophils Relative 11/19/2019 4  % Final  . Eosinophils Absolute 11/19/2019 0.3  0.0 - 0.5 K/uL Final  . Basophils Relative 11/19/2019 1  % Final  . Basophils Absolute 11/19/2019 0.1  0.0 - 0.1 K/uL Final  . Immature Granulocytes 11/19/2019 0  % Final  . Abs Immature Granulocytes 11/19/2019 0.03  0.00 - 0.07 K/uL Final   Performed at [facility] at [facility], [place], [place] [address]    Physical Examination:  BP 120/70   Pulse 92   Ht 6' (1.829 m)   Wt 236 lb (107 kg)   SpO2 93%   BMI 32.01 kg/m        ASSESSMENT/PLAN:   Diabetes type 2, with mild obesity, non-insulin-dependent  His A1c was previously higher at 7.9 and now 7.5  A1c is again higher than expected for his home blood sugar average of 115 However most of his readings are before meals, lab glucose recently was 171 postprandial Has some improvement with increasing Trulicity However even though he is concerned about weight loss he has only lost 4 pounds since December and likely has smaller abdominal girth  Also on Jardiance   Recommendations: Reduce morning Metformin to half tablet because of renal dysfunction He will continue 3 mg Trulicity for now If he has significant decrease in appetite or weight loss will reduce Trulicity again Emphasized the need to check readings after meals  HYPERTENSION: Blood pressure is low normal again Because of his continued renal dysfunction will reduce his LOSARTAN to half a tablet Regular instructions given As he did not follow directions on the last visit We will continue to monitor  No microalbuminuria present  Hypogonadism, history of: His testosterone level was last  210 Again not complaining of any unusual fatigue We will check levels again on next visit and     There are no Patient Instructions on file for this visit.    Elayne Snare 12/16/2019, 8:16 AM   Note: This office note was prepared with Dragon voice recognition system technology. Any transcriptional errors that result from this process are unintentional.

## 2019-12-16 ENCOUNTER — Other Ambulatory Visit: Payer: Self-pay

## 2019-12-16 ENCOUNTER — Ambulatory Visit (INDEPENDENT_AMBULATORY_CARE_PROVIDER_SITE_OTHER): Payer: Medicare Other | Admitting: Endocrinology

## 2019-12-16 ENCOUNTER — Encounter: Payer: Self-pay | Admitting: Endocrinology

## 2019-12-16 VITALS — BP 120/70 | HR 92 | Ht 72.0 in | Wt 236.0 lb

## 2019-12-16 DIAGNOSIS — I10 Essential (primary) hypertension: Secondary | ICD-10-CM | POA: Diagnosis not present

## 2019-12-16 DIAGNOSIS — N289 Disorder of kidney and ureter, unspecified: Secondary | ICD-10-CM

## 2019-12-16 DIAGNOSIS — E291 Testicular hypofunction: Secondary | ICD-10-CM | POA: Diagnosis not present

## 2019-12-16 DIAGNOSIS — E1165 Type 2 diabetes mellitus with hyperglycemia: Secondary | ICD-10-CM

## 2019-12-16 NOTE — Patient Instructions (Addendum)
Check blood sugars on waking up 3  days a week  Also check blood sugars about 2 hours after meals and do this after different meals by rotation  Recommended blood sugar levels on waking up are 90-130 and about 2 hours after meal is 130-180  Please bring your blood sugar monitor to each visit, thank you  Metformin 1/2 in am and 1 at dinner  Losartan 1/2 daily

## 2020-01-15 ENCOUNTER — Other Ambulatory Visit: Payer: Self-pay | Admitting: Endocrinology

## 2020-02-08 ENCOUNTER — Other Ambulatory Visit: Payer: Self-pay

## 2020-02-08 ENCOUNTER — Ambulatory Visit (HOSPITAL_BASED_OUTPATIENT_CLINIC_OR_DEPARTMENT_OTHER)
Admission: RE | Admit: 2020-02-08 | Discharge: 2020-02-08 | Disposition: A | Discharge status: Home / Self Care | Payer: Medicare Other | Source: Ambulatory Visit | Attending: Internal Medicine | Admitting: Internal Medicine

## 2020-02-08 ENCOUNTER — Encounter: Payer: Self-pay | Admitting: Internal Medicine

## 2020-02-08 ENCOUNTER — Ambulatory Visit (INDEPENDENT_AMBULATORY_CARE_PROVIDER_SITE_OTHER): Payer: Medicare Other | Admitting: Internal Medicine

## 2020-02-08 VITALS — BP 98/59 | HR 84 | Temp 97.3°F | Resp 18 | Ht 72.0 in | Wt 231.2 lb

## 2020-02-08 DIAGNOSIS — G4453 Primary thunderclap headache: Secondary | ICD-10-CM | POA: Diagnosis not present

## 2020-02-08 DIAGNOSIS — R519 Headache, unspecified: Secondary | ICD-10-CM | POA: Diagnosis not present

## 2020-02-08 DIAGNOSIS — I10 Essential (primary) hypertension: Secondary | ICD-10-CM

## 2020-02-08 LAB — CBC WITH DIFFERENTIAL/PLATELET
Basophils Absolute: 0 10*3/uL (ref 0.0–0.1)
Basophils Relative: 0.6 % (ref 0.0–3.0)
Eosinophils Absolute: 0.3 10*3/uL (ref 0.0–0.7)
Eosinophils Relative: 4.3 % (ref 0.0–5.0)
HCT: 48.3 % (ref 39.0–52.0)
Hemoglobin: 16.2 g/dL (ref 13.0–17.0)
Lymphocytes Relative: 37.9 % (ref 12.0–46.0)
Lymphs Abs: 2.9 10*3/uL (ref 0.7–4.0)
MCHC: 33.5 g/dL (ref 30.0–36.0)
MCV: 99 fl (ref 78.0–100.0)
Monocytes Absolute: 0.9 10*3/uL (ref 0.1–1.0)
Monocytes Relative: 11.5 % (ref 3.0–12.0)
Neutro Abs: 3.5 10*3/uL (ref 1.4–7.7)
Neutrophils Relative %: 45.7 % (ref 43.0–77.0)
Platelets: 193 10*3/uL (ref 150.0–400.0)
RBC: 4.88 Mil/uL (ref 4.22–5.81)
RDW: 14.8 % (ref 11.5–15.5)
WBC: 7.8 10*3/uL (ref 4.0–10.5)

## 2020-02-08 LAB — BASIC METABOLIC PANEL
BUN: 20 mg/dL (ref 6–23)
CO2: 26 mEq/L (ref 19–32)
Calcium: 9.2 mg/dL (ref 8.4–10.5)
Chloride: 105 mEq/L (ref 96–112)
Creatinine, Ser: 1.47 mg/dL (ref 0.40–1.50)
GFR: 55.5 mL/min — ABNORMAL LOW (ref >60.00)
Glucose, Bld: 162 mg/dL — ABNORMAL HIGH (ref 70–99)
Potassium: 3.2 mEq/L — ABNORMAL LOW (ref 3.5–5.1)
Sodium: 140 mEq/L (ref 135–145)

## 2020-02-08 LAB — SEDIMENTATION RATE: Sed Rate: 11 mm/hr (ref 0–20)

## 2020-02-08 NOTE — Patient Instructions (Addendum)
Per our records you are due for an eye exam. Please contact your eye doctor to schedule an appointment. Please have them send copies of your office visit notes to [place]. Our fax number is [phone].  Drink plenty of fluids  Do not take amlodipine, losartan or triamterene-hydrochlorothiazide today.  Starting  tomorrow take  losartan 100 mg: Half tablet a day  Check your blood pressure daily Call with your readings in 2 to 3 days  ER if severe headache again, dizziness  GO TO THE LAB : Get the blood work     [place], [place] back for a checkup in 2 weeks  Stop by the first floor, get the CAT scan of the head

## 2020-02-08 NOTE — Progress Notes (Signed)
Pre visit review using our clinic review tool, if applicable. No additional management support is needed unless otherwise documented below in the visit note. 

## 2020-02-08 NOTE — Progress Notes (Signed)
Subjective:    Patient ID: Jonathan [name]., male    DOB: [birth year], 84 y.o.   MRN: [id number]  DOS:  02/08/2020 Type of visit - description: Acute Yesterday, he stood up and immediately had pain located behind the left ear. The pain did not radiate, was not associated with any otalgia, tinnitus, LOC. Lasted 1 minute and then went away without leaving any residual pain.  Today his blood pressure is low, he has not been checking his blood pressure lately, when asked about energy level tells me he is always fatigue but denied orthostatic weakness.    Review of Systems Denies upper or lower extremity paresthesias No chest pain no difficulty breathing No nausea or vomiting No visual disturbances  Past Medical History:  Diagnosis Date  . CAD (coronary artery disease)    Two RCA stents remotely / 3rd RCA stent 2006  . Colon polyps    s/p several Cscopes.  Marland Kitchen COPD (chronic obstructive pulmonary disease) (HCC)    on O2, nocturnal  . Diabetes mellitus    dx aprox 2009  . ED (erectile dysfunction)    has a vacumm device  . Ejection fraction   . Hyperlipidemia    dx in 90s  . Hypertension    dx in the 90  . Hypogonadism male   . PVD (peripheral vascular disease) ([place])    s/p stents at LE 2009, Dr Einar Gip  . Shortness of breath    O2 Sat dropped to 82% walking on the treadmill, September, 2012    Past Surgical History:  Procedure Laterality Date  . APPENDECTOMY    . TONSILLECTOMY      Allergies as of 02/08/2020   No Known Allergies     Medication List       Accurate as of Feb 08, 2020  1:49 PM. If you have any questions, ask your nurse or doctor.        amLODipine 10 MG tablet Commonly known as: NORVASC TAKE 1 TABLET DAILY   aspirin 81 MG tablet Take 81 mg by mouth daily.   FreeStyle Freedom Lite w/Device Kit Use Freestyle Freedom Lite meter to check blood sugar twice daily. DX:E11.65   freestyle lancets USE AS INSTRUCTED TO CHECK BLOOD SUGAR TWICE A DAY   FREESTYLE LITE test strip Generic drug: glucose blood USE TO CHECK BLOOD SUGAR TWICE A DAY AS INSTRUCTED   gabapentin 100 MG capsule Commonly known as: NEURONTIN Take 200-300 mg by mouth at bedtime as needed.   Jardiance 25 MG Tabs tablet Generic drug: empagliflozin Take 12.5 mg by mouth daily.   losartan 100 MG tablet Commonly known as: COZAAR Take 1 tablet (100 mg total) by mouth daily.   metFORMIN 1000 MG tablet Commonly known as: GLUCOPHAGE Take 1,000 mg by mouth daily with breakfast.   metoprolol succinate 100 MG 24 hr tablet Commonly known as: Toprol XL Take 1 tablet (100 mg total) by mouth daily.   multivitamin with minerals Tabs tablet Take 1 tablet by mouth daily.   Proventil HFA 108 (90 Base) MCG/ACT inhaler Generic drug: albuterol Inhale 2 puffs into the lungs every 6 (six) hours as needed for wheezing or shortness of breath.   simvastatin 20 MG tablet Commonly known as: ZOCOR Take 20 mg by mouth at bedtime.   Stiolto Respimat 2.5-2.5 MCG/ACT Aers Generic drug: Tiotropium Bromide-Olodaterol Inhale 2 puffs into the lungs daily as needed (for flares).   triamterene-hydrochlorothiazide 37.5-25 MG tablet Commonly known as: MAXZIDE-25 Take 1 tablet  by mouth daily.   Trulicity 3 LV/7.4XV Sopn Generic drug: Dulaglutide INJECT 3 MG UNDER THE SKIN ONCE A WEEK          Objective:   Physical Exam HENT:     Head:     BP (!) 98/59 (BP Location: Left Arm, Patient Position: Sitting, Cuff Size: Normal)   Pulse 84   Temp (!) 97.3 F (36.3 C) (Temporal)   Resp 18   Ht 6' (1.829 m)   Wt 231 lb 4 oz (104.9 kg)   SpO2 97%   BMI 31.36 kg/m  General:   Well developed, NAD, BMI noted. HEENT:  Normocephalic . Face symmetric, atraumatic Neck: No TTP at the cervical spine.  Range of motion normal. Lungs:  CTA B Normal respiratory effort, no intercostal retractions, no accessory muscle use. Heart: RRR,  no murmur.  Lower extremities: no pretibial edema  bilaterally  Skin: Not pale. Not jaundice.  No rash on the head or neck pain Neurologic:  alert & oriented X3.  Speech normal, gait appropriate for age and unassisted. EOMI, pupils equal, reactive.  Motor and DTR symmetric Psych--  Cognition and judgment appear intact.  Cooperative with normal attention span and concentration.  Behavior appropriate. No anxious or depressed appearing.      Assessment     Assessment DM dx ~8550, complicated by CAD, PVD, mild neuropathy. Sees Dr. Dwyane Dee Hypogonadism. Sees Dr. Dwyane Dee HTN Hyperlipidemia COPD, nocturnal O2 prn Back pain, chronic: See visit 10/12/2016 CV: -CAD, Dr. Ron Parker Dr Meda Coffee S/p  stenting 2 to the RCA remote past and the third RCA stent in  2006, negative stress test in 2012, LVEF 55% on echo 2015 -Peripheral vascular disease, stents lower extremity 2009  ED Polycythemia: Saw hematology 11-2018, felt to be due to chronic respiratory failure.  Not likely polycythemia vera  PLAN    Headache: As described above, description of the symptoms is somewhat atypical but given sudden onset need to rule out a ICB-thunderclap headache. Plan: CT head today, sed rate HTN: BPs are typically okay, today BPs were on the low side.  He feels tired but not orthostatic. Sitting 105/69, HR: 85.  Standing 92/60, HR 91. No ambulatory BPs.  Currently on losartan, metoprolol, Maxide. Plan:  -BMP, CBC, push fluids -No BP meds today except for metoprolol, see AVS -Starting tomorrow,  take only half a losartan daily. COPD, nocturnal hypoxemia: Seen by pulmonary 10/30/2019, chest x-ray was very good DM: Last A1c 7.5, CBGs typically in the 120s, no hypoglycemia that the patient can tell. RTC 2 weeks   This visit occurred during the SARS-CoV-2 public health emergency.  Safety protocols were in place, including screening questions prior to the visit, additional usage of staff PPE, and extensive cleaning of exam room while observing appropriate contact time  as indicated for disinfecting solutions.

## 2020-02-09 NOTE — Assessment & Plan Note (Signed)
Headache: As described above, description of the symptoms is somewhat atypical but given sudden onset need to rule out a ICB-thunderclap headache. Plan: CT head today, sed rate HTN: BPs are typically okay, today BPs were on the low side.  He feels tired but not orthostatic. Sitting 105/69, HR: 85.  Standing 92/60, HR 91. No ambulatory BPs.  Currently on losartan, metoprolol, Maxide. Plan:  -BMP, CBC, push fluids -No BP meds today except for metoprolol, see AVS -Starting tomorrow,  take only half a losartan daily. COPD, nocturnal hypoxemia: Seen by pulmonary 10/30/2019, chest x-ray was very good DM: Last A1c 7.5, CBGs typically in the 120s, no hypoglycemia that the patient can tell. RTC 2 weeks

## 2020-02-10 ENCOUNTER — Telehealth: Payer: Self-pay | Admitting: Internal Medicine

## 2020-02-10 NOTE — Telephone Encounter (Signed)
I reviewed his recent labs, they are okay except for a low potassium. He is feeling well. Denies chest pain, difficulty breathing, no further headache. Ambulatory BP are in the 120s, 130s/70. My nurse will send the potassium supplement: 1 tablet daily for 1 week. Otherwise patient will follow up in a couple of weeks are recommended.

## 2020-02-11 MED ORDER — POTASSIUM CHLORIDE ER 10 MEQ PO TBCR: 10.0000 meq | EXTENDED_RELEASE_TABLET | Freq: Every day | ORAL | 0 refills | Status: DC

## 2020-02-11 NOTE — Addendum Note (Signed)
Addended byDamita Dunnings D on: 02/11/2020 07:45 AM   Modules accepted: Orders

## 2020-02-24 ENCOUNTER — Other Ambulatory Visit: Payer: Self-pay

## 2020-02-24 ENCOUNTER — Ambulatory Visit (INDEPENDENT_AMBULATORY_CARE_PROVIDER_SITE_OTHER): Payer: Medicare Other | Admitting: Internal Medicine

## 2020-02-24 ENCOUNTER — Encounter: Payer: Self-pay | Admitting: Internal Medicine

## 2020-02-24 VITALS — BP 111/66 | HR 73 | Temp 97.8°F | Resp 16 | Ht 72.0 in | Wt 235.1 lb

## 2020-02-24 DIAGNOSIS — E782 Mixed hyperlipidemia: Secondary | ICD-10-CM

## 2020-02-24 DIAGNOSIS — I10 Essential (primary) hypertension: Secondary | ICD-10-CM | POA: Diagnosis not present

## 2020-02-24 DIAGNOSIS — R519 Headache, unspecified: Secondary | ICD-10-CM

## 2020-02-24 LAB — BASIC METABOLIC PANEL
BUN: 19 mg/dL (ref 6–23)
CO2: 25 mEq/L (ref 19–32)
Calcium: 9.5 mg/dL (ref 8.4–10.5)
Chloride: 104 mEq/L (ref 96–112)
Creatinine, Ser: 1.38 mg/dL (ref 0.40–1.50)
GFR: 59.69 mL/min — ABNORMAL LOW (ref >60.00)
Glucose, Bld: 126 mg/dL — ABNORMAL HIGH (ref 70–99)
Potassium: 4.1 mEq/L (ref 3.5–5.1)
Sodium: 138 mEq/L (ref 135–145)

## 2020-02-24 LAB — LIPID PANEL
Cholesterol: 117 mg/dL (ref 0–200)
HDL: 37 mg/dL — ABNORMAL LOW (ref >39.00)
LDL Cholesterol: 63 mg/dL (ref 0–99)
NonHDL: 80.09
Total CHOL/HDL Ratio: 3
Triglycerides: 87 mg/dL (ref 0.0–149.0)
VLDL: 17.4 mg/dL (ref 0.0–40.0)

## 2020-02-24 MED ORDER — AMLODIPINE BESYLATE 5 MG PO TABS
5.0000 mg | ORAL_TABLET | Freq: Every day | ORAL | 1 refills | Status: DC
Start: 2020-02-24 — End: 2020-06-28

## 2020-02-24 NOTE — Progress Notes (Signed)
 Subjective:    Patient ID: Orien E Postlewaite Jr., male    DOB: 11/13/1935, 84 y.o.   MRN: [id number]  DOS:  02/24/2020 Type of visit - description: Follow-up Since the last office visit he is doing well. No further headaches however he has from time to time left-sided nuchal pain when he turns his head.  Symptoms are infrequent, ~ once a week. Ambulatory BPs in the 130/73.    Review of Systems See above   Past Medical History:  Diagnosis Date  . CAD (coronary artery disease)    Two RCA stents remotely / 3rd RCA stent 2006  . Colon polyps    s/p several Cscopes.  . COPD (chronic obstructive pulmonary disease) (HCC)    on O2, nocturnal  . Diabetes mellitus    dx aprox 2009  . ED (erectile dysfunction)    has a vacumm device  . Ejection fraction   . Hyperlipidemia    dx in 90s  . Hypertension    dx in the 90  . Hypogonadism male   . PVD (peripheral vascular disease) (HCC)    s/p stents at LE 2009, Dr Ganji  . Shortness of breath    O2 Sat dropped to 82% walking on the treadmill, September, 2012    Past Surgical History:  Procedure Laterality Date  . APPENDECTOMY    . TONSILLECTOMY      Allergies as of 02/24/2020   No Known Allergies     Medication List       Accurate as of February 24, 2020 11:59 PM. If you have any questions, ask your nurse or doctor.        STOP taking these medications   potassium chloride 10 MEQ tablet Commonly known as: KLOR-CON Stopped by: Jose Paz, MD     TAKE these medications   amLODipine 5 MG tablet Commonly known as: NORVASC Take 1 tablet (5 mg total) by mouth daily. What changed:   medication strength  how much to take Changed by: Jose Paz, MD   aspirin 81 MG tablet Take 81 mg by mouth daily.   FreeStyle Freedom Lite w/Device Kit Use Freestyle Freedom Lite meter to check blood sugar twice daily. DX:E11.65   freestyle lancets USE AS INSTRUCTED TO CHECK BLOOD SUGAR TWICE A DAY   FREESTYLE LITE test strip Generic drug:  glucose blood USE TO CHECK BLOOD SUGAR TWICE A DAY AS INSTRUCTED   gabapentin 100 MG capsule Commonly known as: NEURONTIN Take 200-300 mg by mouth at bedtime as needed.   Jardiance 25 MG Tabs tablet Generic drug: empagliflozin Take 12.5 mg by mouth daily.   losartan 100 MG tablet Commonly known as: COZAAR Take 0.5 tablets (50 mg total) by mouth daily.   metFORMIN 1000 MG tablet Commonly known as: GLUCOPHAGE Take 1,000 mg by mouth daily with breakfast.   metoprolol succinate 100 MG 24 hr tablet Commonly known as: Toprol XL Take 1 tablet (100 mg total) by mouth daily.   multivitamin with minerals Tabs tablet Take 1 tablet by mouth daily.   Proventil HFA 108 (90 Base) MCG/ACT inhaler Generic drug: albuterol Inhale 2 puffs into the lungs every 6 (six) hours as needed for wheezing or shortness of breath.   simvastatin 20 MG tablet Commonly known as: ZOCOR Take 20 mg by mouth at bedtime.   Stiolto Respimat 2.5-2.5 MCG/ACT Aers Generic drug: Tiotropium Bromide-Olodaterol Inhale 2 puffs into the lungs daily as needed (for flares).   triamterene-hydrochlorothiazide 37.5-25 MG tablet Commonly known   as: MAXZIDE-25 Take 1 tablet by mouth daily.   Trulicity 3 MG/0.5ML Sopn Generic drug: Dulaglutide INJECT 3 MG UNDER THE SKIN ONCE A WEEK          Objective:   Physical Exam Neck:     BP 111/66 (BP Location: Left Arm, Patient Position: Sitting, Cuff Size: Normal)   Pulse 73   Temp 97.8 F (36.6 C) (Temporal)   Resp 16   Ht 6' (1.829 m)   Wt 235 lb 2 oz (106.7 kg)   SpO2 96%   BMI 31.89 kg/m  General:   Well developed, NAD, BMI noted. HEENT:  Normocephalic . Face symmetric, atraumatic Lungs:  CTA B Normal respiratory effort, no intercostal retractions, no accessory muscle use. Heart: RRR,  no murmur.  Lower extremities: no pretibial edema bilaterally  Skin: Not pale. Not jaundice Neurologic:  alert & oriented X3.  Speech normal, gait appropriate for age  and unassisted Psych--  Cognition and judgment appear intact.  Cooperative with normal attention span and concentration.  Behavior appropriate. No anxious or depressed appearing.      Assessment     Assessment DM dx ~2009, complicated by CAD, PVD, mild neuropathy. Sees Dr. Kumar Hypogonadism. Sees Dr. Kumar HTN Hyperlipidemia COPD, nocturnal O2 prn Back pain, chronic: See visit 10/12/2016 CV: -CAD, Dr. Katz--> Dr Nelson S/p  stenting 2 to the RCA remote past and the third RCA stent in  2006, negative stress test in 2012, LVEF 55% on echo 2015 -Peripheral vascular disease, stents lower extremity 2009  ED Polycythemia: Saw hematology 11-2018, felt to be due to chronic respiratory failure.  Not likely polycythemia vera  PLAN    Headache: Since the last visit, CT head without contrast was nonacute, sed rate was normal.  Today he reports that the pain is again located at the left nuchal area and associated with head motion.  Most likely this is a MSK issue, with talk about possible treatment but the patient prefers observation since the symptoms are at most once a week. HTN: On multiple medications, ambulatory BPs 130/73, BP in the low side here at the office he wonders if he could take less medication. Plan: Decrease amlodipine to 5 mg daily.  Otherwise continue losartan, metoprolol, Maxide.  Check a BMP High cholesterol, on simvastatin, check FLP. RTC 4 months   This visit occurred during the SARS-CoV-2 public health emergency.  Safety protocols were in place, including screening questions prior to the visit, additional usage of staff PPE, and extensive cleaning of exam room while observing appropriate contact time as indicated for disinfecting solutions.  

## 2020-02-24 NOTE — Progress Notes (Signed)
Pre visit review using our clinic review tool, if applicable. No additional management support is needed unless otherwise documented below in the visit note. 

## 2020-02-24 NOTE — Patient Instructions (Addendum)
Happy Birthday!  Per our records you are due for an eye exam. Please contact your eye doctor to schedule an appointment. Please have them send copies of your office visit notes to [place]. Our fax number is [phone].  Change amlodipine from 10 mg to 5 mg daily, and new prescription was sent. The rest of the medications are the same Continue checking your blood pressures.  BP GOAL is between 110/65 and  135/85. If it is consistently higher or lower, let me know     GO TO THE LAB : Get the blood work     Jonathan [name], PLEASE SCHEDULE YOUR APPOINTMENTS Come back for   A check up in 4 months

## 2020-02-25 NOTE — Assessment & Plan Note (Signed)
Headache: Since the last visit, CT head without contrast was nonacute, sed rate was normal.  Today he reports that the pain is again located at the left nuchal area and associated with head motion.  Most likely this is a MSK issue, with talk about possible treatment but the patient prefers observation since the symptoms are at most once a week. HTN: On multiple medications, ambulatory BPs 130/73, BP in the low side here at the office he wonders if he could take less medication. Plan: Decrease amlodipine to 5 mg daily.  Otherwise continue losartan, metoprolol, Maxide.  Check a BMP High cholesterol, on simvastatin, check FLP. RTC 4 months

## 2020-03-10 ENCOUNTER — Other Ambulatory Visit: Payer: Self-pay

## 2020-03-10 ENCOUNTER — Ambulatory Visit: Payer: Medicare Other | Admitting: Internal Medicine

## 2020-03-10 ENCOUNTER — Other Ambulatory Visit (INDEPENDENT_AMBULATORY_CARE_PROVIDER_SITE_OTHER): Payer: Medicare Other

## 2020-03-10 DIAGNOSIS — E291 Testicular hypofunction: Secondary | ICD-10-CM

## 2020-03-10 DIAGNOSIS — E1165 Type 2 diabetes mellitus with hyperglycemia: Secondary | ICD-10-CM

## 2020-03-10 LAB — COMPREHENSIVE METABOLIC PANEL
ALT: 22 U/L (ref 0–53)
AST: 18 U/L (ref 0–37)
Albumin: 4.1 g/dL (ref 3.5–5.2)
Alkaline Phosphatase: 56 U/L (ref 39–117)
BUN: 23 mg/dL (ref 6–23)
CO2: 23 mEq/L (ref 19–32)
Calcium: 9.2 mg/dL (ref 8.4–10.5)
Chloride: 105 mEq/L (ref 96–112)
Creatinine, Ser: 1.45 mg/dL (ref 0.40–1.50)
GFR: 56.37 mL/min — ABNORMAL LOW (ref >60.00)
Glucose, Bld: 156 mg/dL — ABNORMAL HIGH (ref 70–99)
Potassium: 3.7 mEq/L (ref 3.5–5.1)
Sodium: 137 mEq/L (ref 135–145)
Total Bilirubin: 0.6 mg/dL (ref 0.2–1.2)
Total Protein: 6.9 g/dL (ref 6.0–8.3)

## 2020-03-10 LAB — HEMOGLOBIN A1C: Hgb A1c MFr Bld: 7.2 % — ABNORMAL HIGH (ref 4.6–6.5)

## 2020-03-10 LAB — TESTOSTERONE: Testosterone: 257.22 ng/dL — ABNORMAL LOW (ref 300.00–890.00)

## 2020-03-16 ENCOUNTER — Other Ambulatory Visit: Payer: Self-pay

## 2020-03-16 ENCOUNTER — Ambulatory Visit (INDEPENDENT_AMBULATORY_CARE_PROVIDER_SITE_OTHER): Payer: Medicare Other | Admitting: Endocrinology

## 2020-03-16 ENCOUNTER — Encounter: Payer: Self-pay | Admitting: Endocrinology

## 2020-03-16 VITALS — BP 116/64 | HR 76 | Ht 72.0 in | Wt 236.4 lb

## 2020-03-16 DIAGNOSIS — N289 Disorder of kidney and ureter, unspecified: Secondary | ICD-10-CM

## 2020-03-16 DIAGNOSIS — E291 Testicular hypofunction: Secondary | ICD-10-CM

## 2020-03-16 DIAGNOSIS — E1159 Type 2 diabetes mellitus with other circulatory complications: Secondary | ICD-10-CM | POA: Diagnosis not present

## 2020-03-16 LAB — POCT CBG (FASTING - GLUCOSE)-MANUAL ENTRY: Glucose Fasting, POC: 131 mg/dL — AB (ref 70–99)

## 2020-03-16 MED ORDER — CLOMIPHENE CITRATE 50 MG PO TABS: ORAL_TABLET | ORAL | 1 refills | Status: DC

## 2020-03-16 NOTE — Patient Instructions (Signed)
Metformin 1/2 twice daily

## 2020-03-16 NOTE — Progress Notes (Signed)
Patient ID: Jonathan [name]., male   DOB: January 14, 1936, 84 y.o.   MRN: [id number]    Reason for Appointment:  Follow-up  History of Present Illness:          Diagnosis: Type 2 diabetes mellitus, date of diagnosis:  2009      Past history: He is not clear when his diabetes was diagnosed but according to hospital records it may have been in 2009. Most likely he was placed on metformin initially and at some point Amaryl added. In 2013 it was also given Januvia presumably to improve his control. However appears that his A1c has been consistently over 7% Janumet was started instead of metformin and Januvia separately in 2013  He was started on Trulicity in 1/30 instead of Victoza because excessive bruising on his abdomen  Later in 01/2015 he was switched to Bydureon because of insurance noncoverage of his Trulicity  Recent history:   Non-insulin hypoglycemic drugs are: Metformin 865 a day, Trulicity 3.0  mg weekly, Jardiance 25 mg daily  His A1c has improved slightly at 7.2  Fructosamine previously 284  Current blood sugar patterns and problems:  He forgot his meter at home today  However he thinks his blood sugars are mostly near normal and usually under 120  In the past has checked readings at various times of the day  Glucose was 156 after breakfast likely and today 131 fasting  He thinks he is losing weight but it is about the same as before  As usual he is not doing any formal exercise except walking around the home  Does his own cooking  He will sometimes have cereal and otherwise in the morning may still have croissants and meat  He thinks that his appetite is somewhat reduced with Trulicity but no nausea  Renal function stable with continuing Jardiance  He was told to take 500 mg Metformin in the evening along with 1 g in the evening but he is only taking half tablet in the morning    Side effects from medications have been: None  Glucose monitoring:   done  Once a day       Glucometer:  FreeStyle Blood Glucose readings  by recall as above  Previous readings:  PRE-MEAL Fasting Lunch Dinner Bedtime Overall  Glucose range:  87-128   98-134    Mean/median:  113     115   POST-MEAL PC Breakfast PC Lunch PC Dinner  Glucose range:   101, 113  139, 145  Mean/median:          Meals: 3 meals per day. eating egg/meat bread for breakfast, dinner 6-7 PM     Dietician visit: Most recent: 4/16 .               Weight history:  Wt Readings from Last 3 Encounters:  03/16/20 236 lb 6.4 oz (107.2 kg)  02/24/20 235 lb 2 oz (106.7 kg)  02/08/20 231 lb 4 oz (104.9 kg)   Glycemic control:   Lab Results  Component Value Date   HGBA1C 7.2 (H) 03/10/2020   HGBA1C 7.5 (H) 10/30/2019   HGBA1C 7.9 (H) 06/30/2019   Lab Results  Component Value Date   MICROALBUR <0.7 10/30/2019   LDLCALC 63 02/24/2020   CREATININE 1.45 03/10/2020    Lab Results  Component Value Date   FRUCTOSAMINE 284 08/26/2019   FRUCTOSAMINE 284 03/29/2017   FRUCTOSAMINE 268 12/06/2016    OTHER active problems addressed today: See  review of systems  Office Visit on 03/16/2020  Component Date Value Ref Range Status  . Glucose Fasting, POC 03/16/2020 131* 70 - 99 mg/dL Final  Lab on 03/10/2020  Component Date Value Ref Range Status  . Testosterone 03/10/2020 257.22* 300.00 - 890.00 ng/dL Final  . Sodium 03/10/2020 137  135 - 145 mEq/L Final  . Potassium 03/10/2020 3.7  3.5 - 5.1 mEq/L Final  . Chloride 03/10/2020 105  96 - 112 mEq/L Final  . CO2 03/10/2020 23  19 - 32 mEq/L Final  . Glucose, Bld 03/10/2020 156* 70 - 99 mg/dL Final  . BUN 03/10/2020 23  6 - 23 mg/dL Final  . Creatinine, Ser 03/10/2020 1.45  0.40 - 1.50 mg/dL Final  . Total Bilirubin 03/10/2020 0.6  0.2 - 1.2 mg/dL Final  . Alkaline Phosphatase 03/10/2020 56  39 - 117 U/L Final  . AST 03/10/2020 18  0 - 37 U/L Final  . ALT 03/10/2020 22  0 - 53 U/L Final  . Total Protein 03/10/2020 6.9  6.0 - 8.3  g/dL Final  . Albumin 03/10/2020 4.1  3.5 - 5.2 g/dL Final  . GFR 03/10/2020 56.37* >60.00 mL/min Final  . Calcium 03/10/2020 9.2  8.4 - 10.5 mg/dL Final  . Hgb A1c MFr Bld 03/10/2020 7.2* 4.6 - 6.5 % Final   Glycemic Control Guidelines for People with Diabetes:Non Diabetic:  <6%Goal of Therapy: <7%Additional Action Suggested:  >8%      Allergies as of 03/16/2020   No Known Allergies     Medication List       Accurate as of March 16, 2020 11:47 AM. If you have any questions, ask your nurse or doctor.        amLODipine 5 MG tablet Commonly known as: NORVASC Take 1 tablet (5 mg total) by mouth daily.   aspirin 81 MG tablet Take 81 mg by mouth daily.   clomiPHENE 50 MG tablet Commonly known as: CLOMID 1/2 tab hs, 3 days a week HS Started by: Elayne Snare, MD   FreeStyle Freedom Lite w/Device Kit Use Freestyle Freedom Lite meter to check blood sugar twice daily. DX:E11.65   freestyle lancets USE AS INSTRUCTED TO CHECK BLOOD SUGAR TWICE A DAY   FREESTYLE LITE test strip Generic drug: glucose blood USE TO CHECK BLOOD SUGAR TWICE A DAY AS INSTRUCTED   gabapentin 100 MG capsule Commonly known as: NEURONTIN Take 200-300 mg by mouth at bedtime as needed.   Jardiance 25 MG Tabs tablet Generic drug: empagliflozin Take 12.5 mg by mouth daily.   losartan 100 MG tablet Commonly known as: COZAAR Take 0.5 tablets (50 mg total) by mouth daily.   metFORMIN 1000 MG tablet Commonly known as: GLUCOPHAGE Take 1,000 mg by mouth daily with breakfast.   metoprolol succinate 100 MG 24 hr tablet Commonly known as: Toprol XL Take 1 tablet (100 mg total) by mouth daily.   multivitamin with minerals Tabs tablet Take 1 tablet by mouth daily.   Proventil HFA 108 (90 Base) MCG/ACT inhaler Generic drug: albuterol Inhale 2 puffs into the lungs every 6 (six) hours as needed for wheezing or shortness of breath.   simvastatin 20 MG tablet Commonly known as: ZOCOR Take 20 mg by mouth at  bedtime.   Stiolto Respimat 2.5-2.5 MCG/ACT Aers Generic drug: Tiotropium Bromide-Olodaterol Inhale 2 puffs into the lungs daily as needed (for flares).   triamterene-hydrochlorothiazide 37.5-25 MG tablet Commonly known as: MAXZIDE-25 Take 1 tablet by mouth daily.   Trulicity  3 MG/0.5ML Sopn Generic drug: Dulaglutide INJECT 3 MG UNDER THE SKIN ONCE A WEEK       Allergies: No Known Allergies  Past Medical History:  Diagnosis Date  . CAD (coronary artery disease)    Two RCA stents remotely / 3rd RCA stent 2006  . Colon polyps    s/p several Cscopes.  Marland Kitchen COPD (chronic obstructive pulmonary disease) (HCC)    on O2, nocturnal  . Diabetes mellitus    dx aprox 2009  . ED (erectile dysfunction)    has a vacumm device  . Ejection fraction   . Hyperlipidemia    dx in 90s  . Hypertension    dx in the 90  . Hypogonadism male   . PVD (peripheral vascular disease) ([place])    s/p stents at LE 2009, Dr Einar Gip  . Shortness of breath    O2 Sat dropped to 82% walking on the treadmill, September, 2012    Past Surgical History:  Procedure Laterality Date  . APPENDECTOMY    . TONSILLECTOMY      Family History  Problem Relation Age of Onset  . Heart disease Father   . Hypertension Father   . Stroke Father   . Diabetes Paternal Aunt   . Diabetes Maternal Grandmother   . Diabetes Other        GM, nephews, many family members  . Hyperlipidemia Other        ?  . Prostate cancer Brother   . Colon cancer Neg Hx     Social History:  reports that he quit smoking about 42 years ago. His smoking use included cigarettes. He has a 60.00 pack-year smoking history. He has never used smokeless tobacco. He reports current alcohol use. He reports that he does not use drugs.    Review of Systems        Lipids: He has been on treatment with simvastatin 20 mg with good control as follows       Lab Results  Component Value Date   CHOL 117 02/24/2020   HDL 37.00 (L) 02/24/2020   LDLCALC  63 02/24/2020   LDLDIRECT 55.0 01/01/2018   TRIG 87.0 02/24/2020   CHOLHDL 3 02/24/2020                   HYPOGONADISM He had decreased libido and erectile dysfunction previously and was found to have hypogonadism, probably in 2011. He did have a slightly low free testosterone level in 2012 on treatment Prolactin level normal  Previously on AndroGel With his hemoglobin going up to 19.1 in August 2019 he has been told not to take any testosterone  Although previously he had not reported any unusual fatigue he thinks he feels tired all the time, this may be more recent and not related to sleep difficulties  Testosterone level is still low without supplements although slightly better than last year   Lab Results  Component Value Date   TESTOSTERONE 257.22 (L) 03/10/2020   TESTOSTERONE 210.21 (L) 06/30/2019   TESTOSTERONE 280.49 (L) 03/31/2019     He has been evaluated by hematologist for his erythrocytosis, this appears to be resolving with not using any testosterone   CBC Latest Ref Rng & Units 02/08/2020 11/19/2019 08/26/2019  WBC 4.0 - 10.5 K/uL 7.8 8.1 8.0  Hemoglobin 13.0 - 17.0 g/dL 16.2 17.8(H) 17.5(H)  Hematocrit 39 - 52 % 48.3 53.2(H) 52.5(H)  Platelets 150 - 400 K/uL 193.0 220 188.0  HYPERTENSION: This is followed by PCP and is on 169m of losartan along with half Maxide, amlodipine and metoprolol  His amlodipine was reduced to 5 mg by PCP recently  He has been checking blood pressure at home No lightheadedness  BP Readings from Last 3 Encounters:  03/16/20 116/64  02/24/20 111/66  02/08/20 (!) 98/59   RENAL dysfunction: His creatinine is borderline and somewhat variable  Lab Results  Component Value Date   CREATININE 1.45 03/10/2020   CREATININE 1.38 02/24/2020   CREATININE 1.47 02/08/2020     LABS:  Office Visit on 03/16/2020  Component Date Value Ref Range Status  . Glucose Fasting, POC 03/16/2020 131* 70 - 99 mg/dL Final  Lab on  03/10/2020  Component Date Value Ref Range Status  . Testosterone 03/10/2020 257.22* 300.00 - 890.00 ng/dL Final  . Sodium 03/10/2020 137  135 - 145 mEq/L Final  . Potassium 03/10/2020 3.7  3.5 - 5.1 mEq/L Final  . Chloride 03/10/2020 105  96 - 112 mEq/L Final  . CO2 03/10/2020 23  19 - 32 mEq/L Final  . Glucose, Bld 03/10/2020 156* 70 - 99 mg/dL Final  . BUN 03/10/2020 23  6 - 23 mg/dL Final  . Creatinine, Ser 03/10/2020 1.45  0.40 - 1.50 mg/dL Final  . Total Bilirubin 03/10/2020 0.6  0.2 - 1.2 mg/dL Final  . Alkaline Phosphatase 03/10/2020 56  39 - 117 U/L Final  . AST 03/10/2020 18  0 - 37 U/L Final  . ALT 03/10/2020 22  0 - 53 U/L Final  . Total Protein 03/10/2020 6.9  6.0 - 8.3 g/dL Final  . Albumin 03/10/2020 4.1  3.5 - 5.2 g/dL Final  . GFR 03/10/2020 56.37* >60.00 mL/min Final  . Calcium 03/10/2020 9.2  8.4 - 10.5 mg/dL Final  . Hgb A1c MFr Bld 03/10/2020 7.2* 4.6 - 6.5 % Final   Glycemic Control Guidelines for People with Diabetes:Non Diabetic:  <6%Goal of Therapy: <7%Additional Action Suggested:  >8%     Physical Examination:  BP 116/64 (BP Location: Left Arm, Patient Position: Sitting, Cuff Size: Large)   Pulse 76   Ht 6' (1.829 m)   Wt 236 lb 6.4 oz (107.2 kg)   SpO2 93%   BMI 32.06 kg/m        ASSESSMENT/PLAN:   Diabetes type 2, with mild obesity, non-insulin-dependent  His A1c is 7.2  Usually his blood sugars are fairly good but unable to review his home monitor However blood sugars are lower than expected for his A1c He does tend to have higher postprandial readings which he likely does not monitor, lab glucose 156 Considering his age and comorbid conditions his level of control is excellent Reassured him that his weight is not going down because of medications and that he is still has a BMI above 30  Recommendations: Go back to 500 mg twice daily of Metformin More blood sugar monitoring after meals and call if consistently high No change in Trulicity  or Jardiance  HYPERTENSION: Blood pressure is low normal and recently Norvasc reduced Needs to stay on half tablet of losartan also Continue to follow-up with his PCP   HYPOGONADISM: He is complaining of fatigue and not clear if this is related to low testosterone Previously had not felt any different with stopping testosterone supplements Since testosterone gels will be unsafe with his history of polycythemia will try him on clomiphene Discussed how this works and will have him try 25 mg 3 times a week to  see if it will help his energy level and improve testosterone levels He will need fasting lab work in about 2 months including follow-up LH levels     Patient Instructions  Metformin 1/2 twice daily     Elayne Snare 03/16/2020, 11:47 AM   Note: This office note was prepared with Dragon voice recognition system technology. Any transcriptional errors that result from this process are unintentional.

## 2020-04-01 ENCOUNTER — Encounter: Payer: Self-pay | Admitting: Internal Medicine

## 2020-04-04 ENCOUNTER — Other Ambulatory Visit: Payer: Self-pay | Admitting: Endocrinology

## 2020-04-08 ENCOUNTER — Telehealth: Payer: Self-pay | Admitting: Internal Medicine

## 2020-04-08 NOTE — Telephone Encounter (Signed)
Received

## 2020-04-08 NOTE — Telephone Encounter (Signed)
Pt dropped off documents for Provider to have on pt's chart (diabetic eye exam info-provider requested - 16 pages) Document put at front office tray under providers name.

## 2020-04-11 ENCOUNTER — Encounter: Payer: Self-pay | Admitting: Internal Medicine

## 2020-05-18 ENCOUNTER — Encounter: Payer: Self-pay | Admitting: Cardiology

## 2020-05-18 ENCOUNTER — Other Ambulatory Visit: Payer: Self-pay

## 2020-05-18 ENCOUNTER — Ambulatory Visit (INDEPENDENT_AMBULATORY_CARE_PROVIDER_SITE_OTHER): Payer: Medicare Other | Admitting: Cardiology

## 2020-05-18 VITALS — BP 128/74 | HR 80 | Ht 74.0 in | Wt 235.0 lb

## 2020-05-18 DIAGNOSIS — E782 Mixed hyperlipidemia: Secondary | ICD-10-CM | POA: Diagnosis not present

## 2020-05-18 DIAGNOSIS — I2510 Atherosclerotic heart disease of native coronary artery without angina pectoris: Secondary | ICD-10-CM

## 2020-05-18 DIAGNOSIS — I739 Peripheral vascular disease, unspecified: Secondary | ICD-10-CM | POA: Diagnosis not present

## 2020-05-18 NOTE — Progress Notes (Signed)
05/18/2020 Jonathan [name].   1936-08-29  [id number]  Primary Physician Colon Branch, MD Primary Cardiologist: Ena Dawley, MD  Electrophysiologist: None   Reason for Visit/CC: F/u for CAD and PVD  HPI:  Jonathan [name] presents to clinic today for his annual f/u. He has h/o CAD status post stenting x2 to the RCA in the remote past and a third RCA stent in 2006, negative stress test 2012, LVEF 55% on echo in 2015.  History of hypertension, HLD, diabetes mellitus, COPD on home O2, PVD status post stents of lower extremities in 2009. Was on Plavix previously, but this was discontinued in the setting of a GIB. He is a former pt of Dr. Ron Parker. Now followed by Dr. Meda Coffee.   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  He is complaining of claudications in his left lower extremity that sometimes is waking him up at night as well.  He has been compliant with his medication and has no side effects.  Current Meds  Medication Sig  . albuterol (PROVENTIL HFA) 108 (90 Base) MCG/ACT inhaler Inhale 2 puffs into the lungs every 6 (six) hours as needed for wheezing or shortness of breath.  Marland Kitchen amLODipine (NORVASC) 5 MG tablet Take 1 tablet (5 mg total) by mouth daily.  Marland Kitchen aspirin 81 MG tablet Take 81 mg by mouth daily.    . Blood Glucose Monitoring Suppl (FREESTYLE FREEDOM LITE) w/Device KIT Use Freestyle Freedom Lite meter to check blood sugar twice daily. DX:E11.65  . clomiPHENE (CLOMID) 50 MG tablet 1/2 tab hs, 3 days a week HS  . empagliflozin (JARDIANCE) 25 MG TABS tablet Take 12.5 mg by mouth daily.  Marland Kitchen FREESTYLE LITE test strip USE TO CHECK BLOOD SUGAR TWICE A DAY AS INSTRUCTED  . gabapentin (NEURONTIN) 100 MG capsule Take 200-300 mg by mouth at bedtime as needed.  . Lancets (FREESTYLE) lancets USE  AS INSTRUCTED TO CHECK BLOOD SUGAR TWICE A DAY  . losartan (COZAAR) 100 MG tablet Take 0.5 tablets (50 mg total) by mouth daily.  . metFORMIN (GLUCOPHAGE) 1000 MG tablet Take 500 mg by mouth daily with breakfast.   . metoprolol succinate (TOPROL XL) 100 MG 24 hr tablet Take 1 tablet (100 mg total) by mouth daily.  . Multiple Vitamin (MULTIVITAMIN WITH MINERALS) TABS tablet Take 1 tablet by mouth daily.  . simvastatin (ZOCOR) 20 MG tablet Take 20 mg by mouth at bedtime.   . Tiotropium Bromide-Olodaterol (STIOLTO RESPIMAT) 2.5-2.5 MCG/ACT AERS Inhale 2 puffs into the lungs daily as needed (for flares).   . triamterene-hydrochlorothiazide (MAXZIDE-25) 37.5-25 MG tablet Take 1 tablet by mouth daily.   . TRULICITY 3 MP/5.3IR SOPN INJECT 3 MG UNDER THE SKIN ONCE A WEEK   No Known Allergies Past Medical History:  Diagnosis Date  . CAD (coronary artery disease)    Two RCA stents remotely / 3rd RCA stent 2006  . Colon polyps    s/p several Cscopes.  Marland Kitchen COPD (chronic obstructive pulmonary disease) (HCC)    on O2, nocturnal  . Diabetes mellitus    dx aprox 2009  . ED (erectile dysfunction)    has a vacumm device  . Ejection fraction   . Hyperlipidemia    dx in 90s  . Hypertension  dx in the 90  . Hypogonadism male   . PVD (peripheral vascular disease) ([place])    s/p stents at LE 2009, Dr Einar Gip  . Shortness of breath    O2 Sat dropped to 82% walking on the treadmill, September, 2012   Family History  Problem Relation Age of Onset  . Heart disease Father   . Hypertension Father   . Stroke Father   . Diabetes Paternal Aunt   . Diabetes Maternal Grandmother   . Diabetes Other        GM, nephews, many family members  . Hyperlipidemia Other        ?  . Prostate cancer Brother   . Colon cancer Neg Hx    Past Surgical History:  Procedure Laterality Date  . APPENDECTOMY    . TONSILLECTOMY     Social History   Socioeconomic History  . Marital status: Married    Spouse name:  Cerrella   . Number of children: 6  . Years of education: Not on file  . Highest education level: Not on file  Occupational History  . 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 Smoking status: Former Smoker    Packs/day: 2.00    Years: 30.00    Pack years: 60.00    Types: Cigarettes    Quit date: 06/17/1977    Years since quitting: 42.9  . Smokeless tobacco: Never Used  . Tobacco comment: 2 ppd, quit 1987  Vaping Use  . Vaping Use: Never used  Substance and Sexual Activity  . Alcohol use: Yes    Alcohol/week: 0.0 standard drinks    Comment: socially   . Drug use: No  . Sexual activity: Yes  Other Topics Concern  . Not on file  Social History Narrative   4 children (lost 1 son)   Wife has 2 children                Social Determinants of Health   Financial Resource Strain:   . Difficulty of Paying Living Expenses: Not on file  Food Insecurity:   . Worried About Charity fundraiser in the Last Year: Not on file  . Ran Out of Food in the Last Year: Not on file  Transportation Needs:   . Lack of Transportation (Medical): Not on file  . Lack of Transportation (Non-Medical): Not on file  Physical Activity:   . Days of Exercise per Week: Not on file  . Minutes of Exercise per Session: Not on file  Stress:   . Feeling of Stress : Not on file  Social Connections:   . Frequency of Communication with Friends and Family: Not on file  . Frequency of Social Gatherings with Friends and Family: Not on file  . Attends Religious Services: Not on file  . Active Member of Clubs or Organizations: Not on file  . Attends Archivist Meetings: Not on file  . Marital Status: Not on file  Intimate Partner Violence:   . Fear of Current or Ex-Partner: Not on file  . Emotionally Abused: Not on file  . Physically Abused: Not on file  . Sexually Abused: Not on file    Lipid Panel     Component Value Date/Time   CHOL 117 02/24/2020 0944    TRIG 87.0 02/24/2020 0944   HDL 37.00 (L) 02/24/2020 0944   CHOLHDL 3 02/24/2020 0944   VLDL 17.4 02/24/2020 0944  [place] 63 02/24/2020 0944   LDLDIRECT 55.0 01/01/2018 0808   Review of Systems: General: negative for chills, fever, night sweats or weight changes.  Cardiovascular: negative for chest pain, dyspnea on exertion, edema, orthopnea, palpitations, paroxysmal nocturnal dyspnea or shortness of breath Dermatological: negative for rash Respiratory: negative for cough or wheezing Urologic: negative for hematuria Abdominal: negative for nausea, vomiting, diarrhea, bright red blood per rectum, melena, or hematemesis Neurologic: negative for visual changes, syncope, or dizziness All other systems reviewed and are otherwise negative except as noted above.  Physical Exam:  Blood pressure 128/74, pulse 80, height _0  (1.88 m), weight 235 lb (106.6 kg), SpO2 95 %.  General appearance: alert, cooperative and no distress Neck: no carotid bruit and no JVD Lungs: clear to auscultation bilaterally Heart: regular rate and rhythm, S1, S2 normal, no murmur, click, rub or gallop Extremities: extremities normal, atraumatic, no cyanosis or edema Pulses: 2+ and symmetric Skin: Skin color, texture, turgor normal. No rashes or lesions Neurologic: Grossly normal  EKG EKG from today reviewed. NSR 75 bpm. Low voltage, Q in V1,2. Unchanged from prior -- personally reviewed   ASSESSMENT AND PLAN:   1. CAD: h/o RCA stents. Last intervention was in 2006. Stress test in 2012 no ischemia. He denies any recent anginal symptomatology. Continue medical therapy. On ASA, statin and BB. No longer on Plavix due to h/o GIB.  He is active and asymptomatic.  No ischemic work-up is needed at this time.  2. HTN: controlled on current regimen. No changes made today.   3. HLD: controlled on simvastatin.  All his lipids are at goal with LDL below 70.  He is also on Metformin.  4. DM: controlled on current regimen.  Recent Hgb A1c was 7.2 you to%.  5. PVD: h/o LE arterial stents in the left lower extremity, he now has new claudications in her right lower extremity, we will obtain repeat bilateral arterial ultrasound.   Follow-Up w/ Dr. Meda Coffee in 1 year. If LE arterial dopplers show ischemia, we will then refer to Dr. Gwenlyn Found.   Ena Dawley, MD [facility] 05/18/2020 8:59 AM

## 2020-05-18 NOTE — Patient Instructions (Signed)
Medication Instructions:   Your physician recommends that you continue on your current medications as directed. Please refer to the Current Medication list given to you today.  *If you need a refill on your cardiac medications before your next appointment, please call your pharmacy*  Testing/Procedures:  Your physician has requested that you have a lower extremity arterial duplex. This test is an ultrasound of the arteries in the legs or arms. It looks at arterial blood flow in the legs and arms. Allow one hour for Lower and Upper Arterial scans. There are no restrictions or special instructions  Follow-Up: At [facility], you and your health needs are our priority.  As part of our continuing mission to provide you with exceptional heart care, we have created designated Provider Care Teams.  These Care Teams include your primary Cardiologist (physician) and Advanced Practice Providers (APPs -  Physician Assistants and Nurse Practitioners) who all work together to provide you with the care you need, when you need it.  We recommend signing up for the patient portal called "MyChart".  Sign up information is provided on this After Visit Summary.  MyChart is used to connect with patients for Virtual Visits (Telemedicine).  Patients are able to view lab/test results, encounter notes, upcoming appointments, etc.  Non-urgent messages can be sent to your provider as well.   To learn more about what you can do with MyChart, go to [email or web address].    Your next appointment:   12 month(s)  The format for your next appointment:   In Person  Provider:   Ena Dawley, MD

## 2020-05-19 ENCOUNTER — Other Ambulatory Visit: Payer: Medicare Other

## 2020-05-23 NOTE — Progress Notes (Signed)
This encounter was created in error - please disregard.

## 2020-05-24 ENCOUNTER — Other Ambulatory Visit (INDEPENDENT_AMBULATORY_CARE_PROVIDER_SITE_OTHER): Payer: Medicare Other

## 2020-05-24 ENCOUNTER — Other Ambulatory Visit: Payer: Self-pay

## 2020-05-24 ENCOUNTER — Encounter: Payer: Medicare Other | Admitting: Endocrinology

## 2020-05-24 ENCOUNTER — Encounter: Payer: Self-pay | Admitting: Endocrinology

## 2020-05-24 ENCOUNTER — Other Ambulatory Visit: Payer: Self-pay | Admitting: Endocrinology

## 2020-05-24 DIAGNOSIS — D751 Secondary polycythemia: Secondary | ICD-10-CM

## 2020-05-24 DIAGNOSIS — E291 Testicular hypofunction: Secondary | ICD-10-CM | POA: Diagnosis not present

## 2020-05-24 DIAGNOSIS — E1159 Type 2 diabetes mellitus with other circulatory complications: Secondary | ICD-10-CM | POA: Diagnosis not present

## 2020-05-24 LAB — BASIC METABOLIC PANEL
BUN: 19 mg/dL (ref 6–23)
CO2: 24 mEq/L (ref 19–32)
Calcium: 9.3 mg/dL (ref 8.4–10.5)
Chloride: 103 mEq/L (ref 96–112)
Creatinine, Ser: 1.53 mg/dL — ABNORMAL HIGH (ref 0.40–1.50)
GFR: 52.96 mL/min — ABNORMAL LOW (ref >60.00)
Glucose, Bld: 196 mg/dL — ABNORMAL HIGH (ref 70–99)
Potassium: 3.9 mEq/L (ref 3.5–5.1)
Sodium: 138 mEq/L (ref 135–145)

## 2020-05-24 LAB — CBC
HCT: 52.6 % — ABNORMAL HIGH (ref 39.0–52.0)
Hemoglobin: 17.7 g/dL — ABNORMAL HIGH (ref 13.0–17.0)
MCHC: 33.7 g/dL (ref 30.0–36.0)
MCV: 99.6 fl (ref 78.0–100.0)
Platelets: 190 10*3/uL (ref 150.0–400.0)
RBC: 5.28 Mil/uL (ref 4.22–5.81)
RDW: 15 % (ref 11.5–15.5)
WBC: 6.3 10*3/uL (ref 4.0–10.5)

## 2020-05-24 LAB — TESTOSTERONE: Testosterone: 215.23 ng/dL — ABNORMAL LOW (ref 300.00–890.00)

## 2020-05-24 LAB — LUTEINIZING HORMONE: LH: 4.08 m[IU]/mL (ref 3.10–34.60)

## 2020-05-24 NOTE — Progress Notes (Signed)
Patient ID: Jonathan [name]., male   DOB: [birth year], 84 y.o.   MRN: [id number]    Reason for Appointment:  Follow-up  History of Present Illness:          Diagnosis: Type 2 diabetes mellitus, date of diagnosis:  2009      Past history: He is not clear when his diabetes was diagnosed but according to hospital records it may have been in 2009. Most likely he was placed on metformin initially and at some point Amaryl added. In 2013 it was also given Januvia presumably to improve his control. However appears that his A1c has been consistently over 7% Janumet was started instead of metformin and Januvia separately in 2013  He was started on Trulicity in 0/25 instead of Victoza because excessive bruising on his abdomen  Later in 01/2015 he was switched to Bydureon because of insurance noncoverage of his Trulicity  Recent history:   Non-insulin hypoglycemic drugs are: Metformin 427 2x a day, Trulicity 3.0  mg weekly, Jardiance 12.5 mg daily  His A1c on the last visit was at 7.2  Fructosamine previously 284  Current blood sugar patterns and problems:  Although on his previous visit he had reported mostly near normal blood sugars, usually under 120 he did not have his meter  On review of his meter his morning sugars are quite variable and does not monitor, lab glucose was also high at 196  Has relatively better readings rest of the day but only couple of times acceptable later after lunch or dinner  He is still not doing any formal exercise except housework  Also now taking Metformin 500 mg twice daily using the 1000 mg tablet  He takes his Trulicity regularly without nausea, he says he does not want to lose anymore weight  Renal function stable with continuing Jardiance 12.5 mg daily  He was told to take 500 mg Metformin in the evening along with 1 g in the evening but he is only taking half tablet in the morning    Side effects from medications have been:  None  Glucose monitoring:  done  Once a day       Glucometer:  FreeStyle Blood Glucose readings  by    PRE-MEAL Fasting Lunch Dinner Bedtime Overall  Glucose range:  118-232  95     Mean/median:  150     143   POST-MEAL PC Breakfast PC Lunch PC Dinner  Glucose range:   152  134, 172  Mean/median:         Meals: 3 meals per day. eating egg/meat bread for breakfast, dinner 6-7 PM     Dietician visit: Most recent: 4/16 .               Weight history:  Wt Readings from Last 3 Encounters:  05/25/20 234 lb (106.1 kg)  05/24/20 234 lb 6.4 oz (106.3 kg)  05/18/20 235 lb (106.6 kg)   Glycemic control:   Lab Results  Component Value Date   HGBA1C 7.2 (H) 03/10/2020   HGBA1C 7.5 (H) 10/30/2019   HGBA1C 7.9 (H) 06/30/2019   Lab Results  Component Value Date   MICROALBUR <0.7 10/30/2019   LDLCALC 63 02/24/2020   CREATININE 1.53 (H) 05/24/2020    Lab Results  Component Value Date   FRUCTOSAMINE 288 (H) 05/24/2020   FRUCTOSAMINE 284 08/26/2019   FRUCTOSAMINE 284 03/29/2017    OTHER active problems addressed today: See review of systems  Lab on  05/24/2020  Component Date Value Ref Range Status  . Testosterone 05/24/2020 215.23* 300.00 - 890.00 ng/dL Final  . LH 05/24/2020 4.08  3.10 - 34.60 mIU/mL Final   Comment: Male Reference Range:20-70 yrs     1.5-9.3 mIU/mL>70 yrs       3.1-35.6 mIU/mLFemale Reference Range:Follicular Phase     1.1-94.1 mIU/mLMidcycle             8.7-76.3 mIU/mLLuteal Phase         0.5-16.9 mIU/mL  Post Menopausal      15.9-54.0  mIU/mLPregnant             <1.5 mIU/mLContraceptives       0.7-5.6 mIU/mL   . WBC 05/24/2020 6.3  4.0 - 10.5 K/uL Final  . RBC 05/24/2020 5.28  4.22 - 5.81 Mil/uL Final  . Platelets 05/24/2020 190.0  150 - 400 K/uL Final  . Hemoglobin 05/24/2020 17.7* 13.0 - 17.0 g/dL Final  . HCT 05/24/2020 52.6* 39 - 52 % Final  . MCV 05/24/2020 99.6  78.0 - 100.0 fl Final  . MCHC 05/24/2020 33.7  30.0 - 36.0 g/dL Final  . RDW  05/24/2020 15.0  11.5 - 15.5 % Final  . Sodium 05/24/2020 138  135 - 145 mEq/L Final  . Potassium 05/24/2020 3.9  3.5 - 5.1 mEq/L Final  . Chloride 05/24/2020 103  96 - 112 mEq/L Final  . CO2 05/24/2020 24  19 - 32 mEq/L Final  . Glucose, Bld 05/24/2020 196* 70 - 99 mg/dL Final  . BUN 05/24/2020 19  6 - 23 mg/dL Final  . Creatinine, Ser 05/24/2020 1.53* 0.40 - 1.50 mg/dL Final  . GFR 05/24/2020 52.96* >60.00 mL/min Final  . Calcium 05/24/2020 9.3  8.4 - 10.5 mg/dL Final  . Fructosamine 05/24/2020 288* 0 - 285 umol/L Final   Comment: Published reference interval for apparently healthy subjects between age 98 and 45 is 33 - 285 umol/L and in a poorly controlled diabetic population is 228 - 563 umol/L with a mean of 396 umol/L.      Allergies as of 05/25/2020   No Known Allergies     Medication List       Accurate as of May 25, 2020  1:19 PM. If you have any questions, ask your nurse or doctor.        amLODipine 5 MG tablet Commonly known as: NORVASC Take 1 tablet (5 mg total) by mouth daily.   aspirin 81 MG tablet Take 81 mg by mouth daily.   clomiPHENE 50 MG tablet Commonly known as: CLOMID 1/2 tab hs, 3 days a week HS   FreeStyle Freedom Lite w/Device Kit Use Freestyle Freedom Lite meter to check blood sugar twice daily. DX:E11.65   freestyle lancets USE AS INSTRUCTED TO CHECK BLOOD SUGAR TWICE A DAY   FREESTYLE LITE test strip Generic drug: glucose blood USE TO CHECK BLOOD SUGAR TWICE A DAY AS INSTRUCTED   gabapentin 100 MG capsule Commonly known as: NEURONTIN Take 200-300 mg by mouth at bedtime as needed.   Jardiance 25 MG Tabs tablet Generic drug: empagliflozin Take 12.5 mg by mouth daily.   losartan 100 MG tablet Commonly known as: COZAAR Take 0.5 tablets (50 mg total) by mouth daily.   metFORMIN 1000 MG tablet Commonly known as: GLUCOPHAGE Take 500 mg by mouth daily with breakfast.   metoprolol succinate 100 MG 24 hr tablet Commonly known  as: Toprol XL Take 1 tablet (100 mg total) by mouth daily.  multivitamin with minerals Tabs tablet Take 1 tablet by mouth daily.   Proventil HFA 108 (90 Base) MCG/ACT inhaler Generic drug: albuterol Inhale 2 puffs into the lungs every 6 (six) hours as needed for wheezing or shortness of breath.   simvastatin 20 MG tablet Commonly known as: ZOCOR Take 20 mg by mouth at bedtime.   Stiolto Respimat 2.5-2.5 MCG/ACT Aers Generic drug: Tiotropium Bromide-Olodaterol Inhale 2 puffs into the lungs daily as needed (for flares).   triamterene-hydrochlorothiazide 37.5-25 MG tablet Commonly known as: MAXZIDE-25 Take 1 tablet by mouth daily.   Trulicity 3 VH/8.4ON Sopn Generic drug: Dulaglutide INJECT 3 MG UNDER THE SKIN ONCE A WEEK       Allergies: No Known Allergies  Past Medical History:  Diagnosis Date  . CAD (coronary artery disease)    Two RCA stents remotely / 3rd RCA stent 2006  . Colon polyps    s/p several Cscopes.  Marland Kitchen COPD (chronic obstructive pulmonary disease) (HCC)    on O2, nocturnal  . Diabetes mellitus    dx aprox 2009  . ED (erectile dysfunction)    has a vacumm device  . Ejection fraction   . Hyperlipidemia    dx in 90s  . Hypertension    dx in the 90  . Hypogonadism male   . PVD (peripheral vascular disease) ([place])    s/p stents at LE 2009, Dr Einar Gip  . Shortness of breath    O2 Sat dropped to 82% walking on the treadmill, September, 2012    Past Surgical History:  Procedure Laterality Date  . APPENDECTOMY    . TONSILLECTOMY      Family History  Problem Relation Age of Onset  . Heart disease Father   . Hypertension Father   . Stroke Father   . Diabetes Paternal Aunt   . Diabetes Maternal Grandmother   . Diabetes Other        GM, nephews, many family members  . Hyperlipidemia Other        ?  . Prostate cancer Brother   . Colon cancer Neg Hx     Social History:  reports that he quit smoking about 42 years ago. His smoking use included  cigarettes. He has a 60.00 pack-year smoking history. He has never used smokeless tobacco. He reports current alcohol use. He reports that he does not use drugs.    Review of Systems        Lipids: He has been on treatment with simvastatin 20 mg with good control as follows       Lab Results  Component Value Date   CHOL 117 02/24/2020   HDL 37.00 (L) 02/24/2020   LDLCALC 63 02/24/2020   LDLDIRECT 55.0 01/01/2018   TRIG 87.0 02/24/2020   CHOLHDL 3 02/24/2020                   HYPOGONADISM He had decreased libido and erectile dysfunction previously and was found to have hypogonadism, probably in 2011. He did have a slightly low free testosterone level in 2012 on treatment Prolactin level normal  Previously on AndroGel With his hemoglobin going up to 19.1 in August 2019 he has been told not to take any testosterone  Although on his last visit he had reported significant fatigue he thinks he feels fairly good now and is able to do all his household activities and not get weak or tired He was given clomiphene as a trial but he did not pick up the  prescription for unknown reasons  Testosterone level is still low without supplement   Lab Results  Component Value Date   TESTOSTERONE 215.23 (L) 05/24/2020   TESTOSTERONE 257.22 (L) 03/10/2020   TESTOSTERONE 210.21 (L) 06/30/2019     He had been evaluated by hematologist for his erythrocytosis Although hemoglobin had come back normal it is slightly higher now without any use of testosterone    CBC Latest Ref Rng & Units 05/24/2020 02/08/2020 11/19/2019  WBC 4.0 - 10.5 K/uL 6.3 7.8 8.1  Hemoglobin 13.0 - 17.0 g/dL 17.7(H) 16.2 17.8(H)  Hematocrit 39 - 52 % 52.6(H) 48.3 53.2(H)  Platelets 150 - 400 K/uL 190.0 193.0 220          HYPERTENSION: This is followed by PCP and is on 50 mg of losartan along with half Maxide, amlodipine and metoprolol  His amlodipine was reduced to 5 mg by PCP He is periodically checking at home  also  BP Readings from Last 3 Encounters:  05/25/20 130/70  05/24/20 110/70  05/18/20 128/74   RENAL dysfunction: His creatinine is borderline and somewhat variable  Lab Results  Component Value Date   CREATININE 1.53 (H) 05/24/2020   CREATININE 1.45 03/10/2020   CREATININE 1.38 02/24/2020     LABS:  Lab on 05/24/2020  Component Date Value Ref Range Status  . Testosterone 05/24/2020 215.23* 300.00 - 890.00 ng/dL Final  . LH 05/24/2020 4.08  3.10 - 34.60 mIU/mL Final   Comment: Male Reference Range:20-70 yrs     1.5-9.3 mIU/mL>70 yrs       3.1-35.6 mIU/mLFemale Reference Range:Follicular Phase     3.8-46.6 mIU/mLMidcycle             8.7-76.3 mIU/mLLuteal Phase         0.5-16.9 mIU/mL  Post Menopausal      15.9-54.0  mIU/mLPregnant             <1.5 mIU/mLContraceptives       0.7-5.6 mIU/mL   . WBC 05/24/2020 6.3  4.0 - 10.5 K/uL Final  . RBC 05/24/2020 5.28  4.22 - 5.81 Mil/uL Final  . Platelets 05/24/2020 190.0  150 - 400 K/uL Final  . Hemoglobin 05/24/2020 17.7* 13.0 - 17.0 g/dL Final  . HCT 05/24/2020 52.6* 39 - 52 % Final  . MCV 05/24/2020 99.6  78.0 - 100.0 fl Final  . MCHC 05/24/2020 33.7  30.0 - 36.0 g/dL Final  . RDW 05/24/2020 15.0  11.5 - 15.5 % Final  . Sodium 05/24/2020 138  135 - 145 mEq/L Final  . Potassium 05/24/2020 3.9  3.5 - 5.1 mEq/L Final  . Chloride 05/24/2020 103  96 - 112 mEq/L Final  . CO2 05/24/2020 24  19 - 32 mEq/L Final  . Glucose, Bld 05/24/2020 196* 70 - 99 mg/dL Final  . BUN 05/24/2020 19  6 - 23 mg/dL Final  . Creatinine, Ser 05/24/2020 1.53* 0.40 - 1.50 mg/dL Final  . GFR 05/24/2020 52.96* >60.00 mL/min Final  . Calcium 05/24/2020 9.3  8.4 - 10.5 mg/dL Final  . Fructosamine 05/24/2020 288* 0 - 285 umol/L Final   Comment: Published reference interval for apparently healthy subjects between age 70 and 76 is 79 - 285 umol/L and in a poorly controlled diabetic population is 228 - 563 umol/L with a mean of 396 umol/L.     Physical  Examination:  BP 130/70 (BP Location: Left Arm, Patient Position: Sitting)   Pulse 85   Ht 6' 2.02" (1.88 m)   Wt  234 lb (106.1 kg)   SpO2 97%   BMI 30.03 kg/m        ASSESSMENT/PLAN:   Diabetes type 2, with mild obesity, non-insulin-dependent  His A1c is last 7.2  He has variable fasting readings and not clear why these fluctuate May be related to his diet the night before Does need to check readings after meals more consistently He says he does not get the time to do any formal exercise  Dose of Metformin is limited by his renal function We will increase his Trulicity up to 4.5 mg from his next prescription for overall improved benefits on blood sugar control and weight management  HYPERTENSION: Blood pressure is low normal and recently Norvasc reduced Needs to stay on half tablet of losartan also Continue to follow-up with his PCP   HYPOGONADISM:  He does not appear to be symptomatic now Because of his tendency to high RBC counts will not pursue any treatment  Mild erythrocytosis: He will discuss with PCP, currently not being followed by hematologist   Patient Instructions  Check blood sugars on waking up 3 days a week  Also check blood sugars about 2 hours after meals and do this after different meals by rotation  Recommended blood sugar levels on waking up are 90-130 and about 2 hours after meal is 130-180  Please bring your blood sugar monitor to each visit, thank you       Elayne Snare 05/25/2020, 1:19 PM   Note: This office note was prepared with Dragon voice recognition system technology. Any transcriptional errors that result from this process are unintentional.

## 2020-05-24 NOTE — Addendum Note (Signed)
Addended by: Kaylyn Lim I on: 05/24/2020 08:18 AM   Modules accepted: Orders

## 2020-05-25 ENCOUNTER — Ambulatory Visit (INDEPENDENT_AMBULATORY_CARE_PROVIDER_SITE_OTHER): Payer: Medicare Other | Admitting: Endocrinology

## 2020-05-25 VITALS — BP 130/70 | HR 85 | Ht 74.02 in | Wt 234.0 lb

## 2020-05-25 DIAGNOSIS — E291 Testicular hypofunction: Secondary | ICD-10-CM | POA: Diagnosis not present

## 2020-05-25 DIAGNOSIS — D751 Secondary polycythemia: Secondary | ICD-10-CM

## 2020-05-25 DIAGNOSIS — I2510 Atherosclerotic heart disease of native coronary artery without angina pectoris: Secondary | ICD-10-CM | POA: Diagnosis not present

## 2020-05-25 DIAGNOSIS — N289 Disorder of kidney and ureter, unspecified: Secondary | ICD-10-CM | POA: Diagnosis not present

## 2020-05-25 DIAGNOSIS — E1165 Type 2 diabetes mellitus with hyperglycemia: Secondary | ICD-10-CM

## 2020-05-25 LAB — FRUCTOSAMINE: Fructosamine: 288 umol/L — ABNORMAL HIGH (ref 0–285)

## 2020-05-25 NOTE — Patient Instructions (Addendum)
Check blood sugars on waking up 3 days a week  Also check blood sugars about 2 hours after meals and do this after different meals by rotation  Recommended blood sugar levels on waking up are 90-130 and about 2 hours after meal is 130-180  Please bring your blood sugar monitor to each visit, thank you  Next Rx for Trulicity is 4.5

## 2020-05-27 ENCOUNTER — Other Ambulatory Visit (HOSPITAL_COMMUNITY): Payer: Self-pay | Admitting: Cardiology

## 2020-05-27 DIAGNOSIS — I739 Peripheral vascular disease, unspecified: Secondary | ICD-10-CM

## 2020-06-07 ENCOUNTER — Ambulatory Visit (HOSPITAL_COMMUNITY)
Admission: RE | Admit: 2020-06-07 | Payer: Medicare Other | Source: Ambulatory Visit | Attending: Cardiology | Admitting: Cardiology

## 2020-06-27 ENCOUNTER — Ambulatory Visit (HOSPITAL_COMMUNITY)
Admission: RE | Admit: 2020-06-27 | Discharge: 2020-06-27 | Disposition: A | Discharge status: Home / Self Care | Payer: Medicare Other | Source: Ambulatory Visit | Attending: Cardiology | Admitting: Cardiology

## 2020-06-27 ENCOUNTER — Other Ambulatory Visit: Payer: Self-pay

## 2020-06-27 DIAGNOSIS — I2510 Atherosclerotic heart disease of native coronary artery without angina pectoris: Secondary | ICD-10-CM | POA: Insufficient documentation

## 2020-06-27 DIAGNOSIS — I739 Peripheral vascular disease, unspecified: Secondary | ICD-10-CM | POA: Insufficient documentation

## 2020-06-27 DIAGNOSIS — E782 Mixed hyperlipidemia: Secondary | ICD-10-CM | POA: Diagnosis not present

## 2020-06-28 ENCOUNTER — Other Ambulatory Visit: Payer: Self-pay

## 2020-06-28 ENCOUNTER — Telehealth: Payer: Self-pay | Admitting: Internal Medicine

## 2020-06-28 ENCOUNTER — Encounter: Payer: Self-pay | Admitting: Internal Medicine

## 2020-06-28 ENCOUNTER — Ambulatory Visit (INDEPENDENT_AMBULATORY_CARE_PROVIDER_SITE_OTHER): Payer: Medicare Other | Admitting: Internal Medicine

## 2020-06-28 VITALS — BP 91/60 | HR 70 | Temp 98.1°F | Resp 18 | Ht 74.0 in | Wt 231.0 lb

## 2020-06-28 DIAGNOSIS — I70202 Unspecified atherosclerosis of native arteries of extremities, left leg: Secondary | ICD-10-CM

## 2020-06-28 DIAGNOSIS — R634 Abnormal weight loss: Secondary | ICD-10-CM | POA: Diagnosis not present

## 2020-06-28 DIAGNOSIS — I10 Essential (primary) hypertension: Secondary | ICD-10-CM | POA: Diagnosis not present

## 2020-06-28 DIAGNOSIS — F439 Reaction to severe stress, unspecified: Secondary | ICD-10-CM | POA: Diagnosis not present

## 2020-06-28 LAB — TSH: TSH: 1.54 mIU/L (ref 0.40–4.50)

## 2020-06-28 MED ORDER — AMLODIPINE BESYLATE 5 MG PO TABS
5.0000 mg | ORAL_TABLET | Freq: Every day | ORAL | 3 refills | Status: DC
Start: 2020-06-28 — End: 2023-02-22

## 2020-06-28 NOTE — Progress Notes (Signed)
Subjective:    Patient ID: Jonathan [name]., male    DOB: Sep 20, 1935, 84 y.o.   MRN: [id number]  DOS:  06/28/2020 Type of visit - description: Routine visit Reports that in general he feels well but he is concerned about weight loss. Denies fever chills No night sweats No lower extremity edema No nausea, vomiting, diarrhea or blood in the stools.  No abdominal pain or dysphagia.  I ask about stress, he admits to high stress due to taking care of his wife who has dementia but no anxiety or depression per se.  BP today is low, he feels well today with no orthostatic dizziness or any other symptoms.  BP Readings from Last 3 Encounters:  06/28/20 91/60  05/25/20 130/70  05/24/20 110/70    Review of Systems See above Denies headaches  Past Medical History:  Diagnosis Date  . CAD (coronary artery disease)    Two RCA stents remotely / 3rd RCA stent 2006  . Colon polyps    s/p several Cscopes.  Marland Kitchen COPD (chronic obstructive pulmonary disease) (HCC)    on O2, nocturnal  . Diabetes mellitus    dx aprox 2009  . ED (erectile dysfunction)    has a vacumm device  . Ejection fraction   . Hyperlipidemia    dx in 90s  . Hypertension    dx in the 90  . Hypogonadism male   . PVD (peripheral vascular disease) ([place])    s/p stents at LE 2009, Dr Einar Gip  . Shortness of breath    O2 Sat dropped to 82% walking on the treadmill, September, 2012    Past Surgical History:  Procedure Laterality Date  . APPENDECTOMY    . TONSILLECTOMY      Allergies as of 06/28/2020   No Known Allergies     Medication List       Accurate as of June 28, 2020  9:08 AM. If you have any questions, ask your nurse or doctor.        amLODipine 5 MG tablet Commonly known as: NORVASC Take 1 tablet (5 mg total) by mouth daily.   aspirin 81 MG tablet Take 81 mg by mouth daily.   FreeStyle Freedom Lite w/Device Kit Use Freestyle Freedom Lite meter to check blood sugar twice daily. DX:E11.65    freestyle lancets USE AS INSTRUCTED TO CHECK BLOOD SUGAR TWICE A DAY   FREESTYLE LITE test strip Generic drug: glucose blood USE TO CHECK BLOOD SUGAR TWICE A DAY AS INSTRUCTED   gabapentin 100 MG capsule Commonly known as: NEURONTIN Take 200-300 mg by mouth at bedtime as needed.   Jardiance 25 MG Tabs tablet Generic drug: empagliflozin Take 12.5 mg by mouth daily.   losartan 100 MG tablet Commonly known as: COZAAR Take 0.5 tablets (50 mg total) by mouth daily.   metFORMIN 1000 MG tablet Commonly known as: GLUCOPHAGE Take 500 mg by mouth daily with breakfast.   metoprolol succinate 100 MG 24 hr tablet Commonly known as: Toprol XL Take 1 tablet (100 mg total) by mouth daily.   multivitamin with minerals Tabs tablet Take 1 tablet by mouth daily.   Proventil HFA 108 (90 Base) MCG/ACT inhaler Generic drug: albuterol Inhale 2 puffs into the lungs every 6 (six) hours as needed for wheezing or shortness of breath.   simvastatin 20 MG tablet Commonly known as: ZOCOR Take 20 mg by mouth at bedtime.   Stiolto Respimat 2.5-2.5 MCG/ACT Aers Generic drug: Tiotropium Bromide-Olodaterol Inhale 2  puffs into the lungs daily as needed (for flares).   triamterene-hydrochlorothiazide 37.5-25 MG tablet Commonly known as: MAXZIDE-25 Take 1 tablet by mouth daily.   Trulicity 3 MG/0.5ML Sopn Generic drug: Dulaglutide INJECT 3 MG UNDER THE SKIN ONCE A WEEK          Objective:   Physical Exam BP 91/60 (BP Location: Left Arm, Patient Position: Sitting, Cuff Size: Normal)   Pulse 70   Temp 98.1 F (36.7 C) (Oral)   Resp 18   Ht 6' 2" (1.88 m)   Wt 231 lb (104.8 kg)   SpO2 95%   BMI 29.66 kg/m  General:   Well developed, NAD, BMI noted. HEENT:  Normocephalic . Face symmetric, atraumatic Lungs:  CTA B Normal respiratory effort, no intercostal retractions, no accessory muscle use. Heart: RRR,  no murmur.  Lower extremities: no pretibial edema bilaterally  Skin: Not pale.  Not jaundice Neurologic:  alert & oriented X3.  Speech normal, gait appropriate for age and unassisted Psych--  Cognition and judgment appear intact.  Cooperative with normal attention span and concentration.  Behavior appropriate. No anxious or depressed appearing.      Assessment    Assessment DM dx ~2009, complicated by CAD, PVD, mild neuropathy. Sees Dr. Kumar Hypogonadism. Sees Dr. Kumar HTN Hyperlipidemia COPD, nocturnal O2 prn Back pain, chronic: See visit 10/12/2016 CV: -CAD, Dr. Katz--> Dr Nelson S/p  stenting 2 to the RCA remote past and the third RCA stent in  2006, negative stress test in 2012, LVEF 55% on echo 2015 -Peripheral vascular disease, stents lower extremity 2009  ED Polycythemia: Saw hematology 11-2018, felt to be due to chronic respiratory failure.  Not likely polycythemia vera  PLAN    DM: Per Endo HTN: At the last visit, we agreed on decrease amlodipine to 5 mg but apparently he is still taking 10.  BP today is low, he is asx, at home BPs ~ 130/70. Plan: Decrease amlodipine to 5 mg daily, new Rx provided (unable to cut 10 mg tablet in half), continue losartan, metoprolol, Maxide.  Monitor BPs, consider adjust further medications.  See AVS. Weight loss: Very gradual, in 2017 he was 256 pounds, today is 231.  ROS negative, recent labs with no anemia.  Weight loss possibly a combination of things including a stress and some of his DM medicines.  Recommend observation, reassured that his BMI is healthy.  Check a TSH. Stress: We had a long conversation about taking care of his wife, worried about provider burnout.  Listening therapy provided. Preventive care reviewed RTC 4 to 5 months  Time spent with the patient today 32 minutes    This visit occurred during the SARS-CoV-2 public health emergency.  Safety protocols were in place, including screening questions prior to the visit, additional usage of staff PPE, and extensive cleaning of exam room while  observing appropriate contact time as indicated for disinfecting solutions.  

## 2020-06-28 NOTE — Telephone Encounter (Signed)
Please advise 

## 2020-06-28 NOTE — Telephone Encounter (Signed)
We can fax my office visit note, will be sign tomorrow

## 2020-06-28 NOTE — Progress Notes (Signed)
Pre visit review using our clinic review tool, if applicable. No additional management support is needed unless otherwise documented below in the visit note. 

## 2020-06-28 NOTE — Telephone Encounter (Signed)
Pt came in office stating that provider had changed his meds today, amLODipine (NORVASC) 5 MG tablet,  From 10 mg tablet to 5 mg tablet, pt stated that [facility] is needing an explication of why the meds is changed from PCP sent to Fax at the [place] [phone]. Pt mentioned the explanation is needed before pt can get his meds. Please advise. (pt left [facility] post card with [facility] info and fax number) Card given to Marcus.

## 2020-06-28 NOTE — Patient Instructions (Addendum)
Take amlodipine 5 mg daily  Your blood pressure is a little low today, please check it at home 3-4 times a week. BP GOAL is between 110/65 and  135/85. If it is consistently higher or lower, let me know  Take a Covid vaccine booster as soon as the CDC recommends it  GO TO THE LAB : Get the blood work     [place], [place] back for a checkup in 4 to 5 months

## 2020-06-29 ENCOUNTER — Telehealth: Payer: Self-pay | Admitting: *Deleted

## 2020-06-29 DIAGNOSIS — I70202 Unspecified atherosclerosis of native arteries of extremities, left leg: Secondary | ICD-10-CM

## 2020-06-29 DIAGNOSIS — R936 Abnormal findings on diagnostic imaging of limbs: Secondary | ICD-10-CM

## 2020-06-29 DIAGNOSIS — I739 Peripheral vascular disease, unspecified: Secondary | ICD-10-CM

## 2020-06-29 NOTE — Telephone Encounter (Signed)
Endorsed to the pt his lower extremity arterial US results and recommendations per Dr. Meda Coffee, for him to be referred back to Dr. Gwenlyn Found or Dr. Fletcher Anon, for further management of this.  Pt did see Dr. Gwenlyn Found back in 2013.  Informed the pt that I will need to place another referral in the system and send a message to our Schedulers to call him back and arrange this new pt appt.  Pt verbalized understanding and agrees with this plan.

## 2020-06-29 NOTE — Telephone Encounter (Signed)
-----   Message from Dorothy Spark, MD sent at 06/28/2020  9:14 AM EDT ----- Left: 75-99% stenosis noted in the superficial femoral artery and/or  popliteal artery. Moderate progression is noted compared to previous  study. Significant distal SFA stenosis at the distal edge of the stent.  Please refer to Dr Raeanne Barry or Gwenlyn Found

## 2020-06-29 NOTE — Assessment & Plan Note (Signed)
-  Td 2016 -  PNM 23:  2015; prevnar 2015 - zostavax-- states he got it at [facility], no records  - s/p shingrix - s/p moderna x2, recommend to follow-up CDC recommendations. -Had a flu shot -CCS: Colonoscopy-- h/o polyps in colon, s/p several Cscopes per pt,  Colonoscopy 12/06/2014, no polyps, next in 7 years  -DRE and PSA done 11/2018: WNL

## 2020-06-29 NOTE — Telephone Encounter (Signed)
OV note faxed to the [place] at [phone].

## 2020-06-29 NOTE — Assessment & Plan Note (Signed)
DM: Per Endo HTN: At the last visit, we agreed on decrease amlodipine to 5 mg but apparently he is still taking 10.  BP today is low, he is asx, at home BPs ~ 130/70. Plan: Decrease amlodipine to 5 mg daily, new Rx provided (unable to cut 10 mg tablet in half), continue losartan, metoprolol, Maxide.  Monitor BPs, consider adjust further medications.  See AVS. Weight loss: Very gradual, in 2017 he was 256 pounds, today is 231.  ROS negative, recent labs with no anemia.  Weight loss possibly a combination of things including a stress and some of his DM medicines.  Recommend observation, reassured that his BMI is healthy.  Check a TSH. Stress: We had a long conversation about taking care of his wife, worried about provider burnout.  Listening therapy provided. Preventive care reviewed RTC 4 to 5 months

## 2020-07-18 DIAGNOSIS — Z23 Encounter for immunization: Secondary | ICD-10-CM | POA: Diagnosis not present

## 2020-08-02 ENCOUNTER — Encounter: Payer: Self-pay | Admitting: Cardiovascular Disease

## 2020-08-02 ENCOUNTER — Ambulatory Visit (INDEPENDENT_AMBULATORY_CARE_PROVIDER_SITE_OTHER): Payer: Medicare Other | Admitting: Cardiovascular Disease

## 2020-08-02 ENCOUNTER — Other Ambulatory Visit: Payer: Self-pay

## 2020-08-02 DIAGNOSIS — R252 Cramp and spasm: Secondary | ICD-10-CM | POA: Diagnosis not present

## 2020-08-02 NOTE — Progress Notes (Signed)
08/02/2020 Jonathan [name].   [birth year]  [id number]  Primary Physician Colon Branch, MD Primary Cardiologist: Lorretta Harp MD Garret [place], [place], [place]  HPI:  Jonathan [name]. is a 84 y.o. 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  He is referred by Dr. Meda Coffee, his cardiologist for evaluation of lower extremity cramping.  He takes care of his wife/dementia.  He has had coronary artery disease status post remote RCA stenting as recently as 2006.  He smoked remotely as well.  He history of diabetes, hypertension hyperlipidemia.  Never had a heart tach or stroke.  He does have COPD and complains of shortness of breath but denies chest pain.  He has had bilateral lower extremity stenting by Dr. Einar Gip back in 2009.  He complains of cramping at night consistent with restless leg syndrome but denies claudication.  Recent lower extremity arterial Doppler studies performed in our office 06/28/2020 revealed a right ABI of 1.03 and left and a left of 0.90 with a mildly elevated high-frequency signal in his left SFA stent.   Current Meds  Medication Sig  . albuterol (PROVENTIL HFA) 108 (90 Base) MCG/ACT inhaler Inhale 2 puffs into the lungs every 6 (six) hours as needed for wheezing or shortness of breath.  Marland Kitchen amLODipine (NORVASC) 5 MG tablet Take 1 tablet (5 mg total) by mouth daily.  Marland Kitchen aspirin 81 MG tablet Take 81 mg by mouth daily.    . Blood Glucose Monitoring Suppl (FREESTYLE FREEDOM LITE) w/Device KIT Use Freestyle Freedom Lite meter to check blood sugar twice daily. DX:E11.65  . empagliflozin (JARDIANCE) 25 MG TABS tablet Take 12.5 mg by mouth daily.  Marland Kitchen FREESTYLE LITE test strip USE TO CHECK BLOOD SUGAR TWICE A DAY AS INSTRUCTED  . gabapentin (NEURONTIN) 100 MG capsule Take 200-300 mg by mouth at bedtime as needed.  . Lancets (FREESTYLE) lancets USE AS INSTRUCTED TO CHECK BLOOD SUGAR TWICE A  DAY  . losartan (COZAAR) 100 MG tablet Take 0.5 tablets (50 mg total) by mouth daily.  . metFORMIN (GLUCOPHAGE) 1000 MG tablet Take 500 mg by mouth daily with breakfast.   . metoprolol succinate (TOPROL XL) 100 MG 24 hr tablet Take 1 tablet (100 mg total) by mouth daily.  . Multiple Vitamin (MULTIVITAMIN WITH MINERALS) TABS tablet Take 1 tablet by mouth daily.  . simvastatin (ZOCOR) 20 MG tablet Take 20 mg by mouth at bedtime.   . Tiotropium Bromide-Olodaterol (STIOLTO RESPIMAT) 2.5-2.5 MCG/ACT AERS Inhale 2 puffs into the lungs daily as needed (for flares).   . triamterene-hydrochlorothiazide (MAXZIDE-25) 37.5-25 MG tablet Take 1 tablet by mouth daily.   . TRULICITY 3 HJ/6.4BI SOPN INJECT 3 MG UNDER THE SKIN ONCE A WEEK     No Known Allergies  Social History   Socioeconomic History  . Marital status: Married    Spouse name: Cerrella   . Number of children: 6  . Years of education: Not on file  . Highest education level: Not on file  Occupational History  . 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 Smoking status: Former Smoker    Packs/day: 2.00    Years: 30.00    Pack years: 60.00    Types: Cigarettes    Quit date: 06/17/1977    Years since quitting: 43.1  . Smokeless tobacco: Never Used  . Tobacco comment: 2 ppd,  quit 1987  Vaping Use  . Vaping Use: Never used  Substance and Sexual Activity  . Alcohol use: Yes    Alcohol/week: 0.0 standard drinks    Comment: socially   . Drug use: No  . Sexual activity: Yes  Other Topics Concern  . Not on file  Social History Narrative   4 children (lost 1 son)   Wife has 2 children                Social Determinants of Health   Financial Resource Strain:   . Difficulty of Paying Living Expenses: Not on file  Food Insecurity:   . Worried About Charity fundraiser in the Last Year: Not on file  . Ran Out of Food in the Last Year: Not on file  Transportation Needs:   . Lack of  Transportation (Medical): Not on file  . Lack of Transportation (Non-Medical): Not on file  Physical Activity:   . Days of Exercise per Week: Not on file  . Minutes of Exercise per Session: Not on file  Stress:   . Feeling of Stress : Not on file  Social Connections:   . Frequency of Communication with Friends and Family: Not on file  . Frequency of Social Gatherings with Friends and Family: Not on file  . Attends Religious Services: Not on file  . Active Member of Clubs or Organizations: Not on file  . Attends Archivist Meetings: Not on file  . Marital Status: Not on file  Intimate Partner Violence:   . Fear of Current or Ex-Partner: Not on file  . Emotionally Abused: Not on file  . Physically Abused: Not on file  . Sexually Abused: Not on file     Review of Systems: General: negative for chills, fever, night sweats or weight changes.  Cardiovascular: negative for chest pain, dyspnea on exertion, edema, orthopnea, palpitations, paroxysmal nocturnal dyspnea or shortness of breath Dermatological: negative for rash Respiratory: negative for cough or wheezing Urologic: negative for hematuria Abdominal: negative for nausea, vomiting, diarrhea, bright red blood per rectum, melena, or hematemesis Neurologic: negative for visual changes, syncope, or dizziness All other systems reviewed and are otherwise negative except as noted above.    Blood pressure 120/63, pulse 88, height '6\' 2"'  (1.88 m), weight 233 lb (105.7 kg), SpO2 95 %.  General appearance: alert and no distress Neck: no adenopathy, no carotid bruit, no JVD, supple, symmetrical, trachea midline and thyroid not enlarged, symmetric, no tenderness/mass/nodules Lungs: clear to auscultation bilaterally Heart: regular rate and rhythm, S1, S2 normal, no murmur, click, rub or gallop Extremities: extremities normal, atraumatic, no cyanosis or edema Pulses: 2+ and symmetric Skin: Skin color, texture, turgor normal. No  rashes or lesions Neurologic: Alert and oriented X 3, normal strength and tone. Normal symmetric reflexes. Normal coordination and gait  EKG not performed today  ASSESSMENT AND PLAN:   Cramp of both lower extremities Mr. Harbaugh was referred to me by Dr. Meda Coffee for what sounds like lower extremity cramping at night consistent with restless leg syndrome.  He did have bilateral SFA stents back in 2009 by Dr. Einar Gip.  Recent lower extremity Dopplers performed 06/28/2020 revealed a right ABI of 1.03 and a left of 0.90.  He had a moderately elevated signal in his left SFA stent although he really denies claudication.  No further vascular work-up is indicated at this time.  He may benefit from treatment of restless leg syndrome with Lyrica although I will leave  this to his primary care physician to decide.      Lorretta Harp MD FACP,FACC,FAHA, [facility] 08/02/2020 10:32 AM

## 2020-08-02 NOTE — Assessment & Plan Note (Signed)
Jonathan [name] was referred to me by Dr. Meda Coffee for what sounds like lower extremity cramping at night consistent with restless leg syndrome.  He did have bilateral SFA stents back in 2009 by Dr. Einar Gip.  Recent lower extremity Dopplers performed 06/28/2020 revealed a right ABI of 1.03 and a left of 0.90.  He had a moderately elevated signal in his left SFA stent although he really denies claudication.  No further vascular work-up is indicated at this time.  He may benefit from treatment of restless leg syndrome with Lyrica although I will leave this to his primary care physician to decide.

## 2020-08-02 NOTE — Patient Instructions (Signed)
Medication Instructions:  Your physician recommends that you continue on your current medications as directed. Please refer to the Current Medication list given to you today.  *If you need a refill on your cardiac medications before your next appointment, please call your pharmacy*  Follow-Up: At [facility], you and your health needs are our priority.  As part of our continuing mission to provide you with exceptional heart care, we have created designated Provider Care Teams.  These Care Teams include your primary Cardiologist (physician) and Advanced Practice Providers (APPs -  Physician Assistants and Nurse Practitioners) who all work together to provide you with the care you need, when you need it.  We recommend signing up for the patient portal called "MyChart".  Sign up information is provided on this After Visit Summary.  MyChart is used to connect with patients for Virtual Visits (Telemedicine).  Patients are able to view lab/test results, encounter notes, upcoming appointments, etc.  Non-urgent messages can be sent to your provider as well.   To learn more about what you can do with MyChart, go to [email or web address].    Your next appointment:   You may see Dr. Gwenlyn Found as needed.  If you need an appointment feel free to call our office.

## 2020-08-17 ENCOUNTER — Other Ambulatory Visit: Payer: Medicare Other

## 2020-08-22 ENCOUNTER — Other Ambulatory Visit: Payer: Self-pay

## 2020-08-22 ENCOUNTER — Other Ambulatory Visit (INDEPENDENT_AMBULATORY_CARE_PROVIDER_SITE_OTHER): Payer: Medicare Other

## 2020-08-22 DIAGNOSIS — E1165 Type 2 diabetes mellitus with hyperglycemia: Secondary | ICD-10-CM

## 2020-08-22 LAB — BASIC METABOLIC PANEL
BUN: 21 mg/dL (ref 6–23)
CO2: 25 mEq/L (ref 19–32)
Calcium: 9 mg/dL (ref 8.4–10.5)
Chloride: 106 mEq/L (ref 96–112)
Creatinine, Ser: 1.44 mg/dL (ref 0.40–1.50)
GFR: 45.26 mL/min — ABNORMAL LOW (ref >60.00)
Glucose, Bld: 159 mg/dL — ABNORMAL HIGH (ref 70–99)
Potassium: 3.6 mEq/L (ref 3.5–5.1)
Sodium: 139 mEq/L (ref 135–145)

## 2020-08-22 LAB — HEMOGLOBIN A1C: Hgb A1c MFr Bld: 7.3 % — ABNORMAL HIGH (ref 4.6–6.5)

## 2020-08-24 ENCOUNTER — Other Ambulatory Visit: Payer: Self-pay

## 2020-08-24 ENCOUNTER — Ambulatory Visit (INDEPENDENT_AMBULATORY_CARE_PROVIDER_SITE_OTHER): Payer: Medicare Other | Admitting: Endocrinology

## 2020-08-24 VITALS — BP 122/70 | HR 67 | Ht 74.0 in | Wt 231.0 lb

## 2020-08-24 DIAGNOSIS — E1142 Type 2 diabetes mellitus with diabetic polyneuropathy: Secondary | ICD-10-CM

## 2020-08-24 DIAGNOSIS — I70202 Unspecified atherosclerosis of native arteries of extremities, left leg: Secondary | ICD-10-CM

## 2020-08-24 DIAGNOSIS — E1165 Type 2 diabetes mellitus with hyperglycemia: Secondary | ICD-10-CM

## 2020-08-24 DIAGNOSIS — D751 Secondary polycythemia: Secondary | ICD-10-CM

## 2020-08-24 DIAGNOSIS — E291 Testicular hypofunction: Secondary | ICD-10-CM

## 2020-08-24 MED ORDER — FREESTYLE FREEDOM LITE W/DEVICE KIT: PACK | 0 refills | Status: DC

## 2020-08-24 NOTE — Progress Notes (Signed)
Patient ID: Jonathan [name]., male   DOB: 08-29-1936, 84 y.o.   MRN: [id number]    Reason for Appointment:  Follow-up  History of Present Illness:          Diagnosis: Type 2 diabetes mellitus, date of diagnosis:  2009      Past history: He is not clear when his diabetes was diagnosed but according to hospital records it may have been in 2009. Most likely he was placed on metformin initially and at some point Amaryl added. In 2013 it was also given Januvia presumably to improve his control. However appears that his A1c has been consistently over 7% Janumet was started instead of metformin and Januvia separately in 2013  He was started on Trulicity in 1/79 instead of Victoza because excessive bruising on his abdomen  Later in 01/2015 he was switched to Bydureon because of insurance noncoverage of his Trulicity  Recent history:   Non-insulin hypoglycemic drugs are: Metformin 150 2x a day, Trulicity 3.0  mg weekly, Jardiance 12.5 mg daily  His A1c on the last visit was at 7.2 and now is  7.3  Fructosamine previously 288  Current blood sugar patterns and problems:  His blood sugars are difficult to assess as he is checking very infrequently and mostly in the morning  He has some difficulty getting his freestyle meter to work and only has readings from the last 3 days recently  Lab glucose was 150 nonfasting but his home readings are as low as 116 in the morning  He was told to call about increasing his Trulicity to 4.5 mg but he did not do so  Highest reading recently at home was 165 after supper  He is still not doing any formal exercise except generally staying active at home  Weight is the same  He is recently not able to follow his diet consistently because of eating out and traveling    Side effects from medications have been: None  Glucose monitoring:  done  Once a day       Glucometer:  FreeStyle Blood Glucose readings  by recall as above  FASTING range  116-142 and overall average 129   PRE-MEAL Fasting Lunch Dinner Bedtime Overall  Glucose range:  118-232  95     Mean/median:  150     143   POST-MEAL PC Breakfast PC Lunch PC Dinner  Glucose range:   152  134, 172  Mean/median:         Meals: 3 meals per day. eating egg/meat bread for breakfast, dinner 6-7 PM     Dietician visit: Most recent: 4/16 .               Weight history:  Wt Readings from Last 3 Encounters:  08/24/20 231 lb (104.8 kg)  08/02/20 233 lb (105.7 kg)  06/28/20 231 lb (104.8 kg)   Glycemic control:   Lab Results  Component Value Date   HGBA1C 7.3 (H) 08/22/2020   HGBA1C 7.2 (H) 03/10/2020   HGBA1C 7.5 (H) 10/30/2019   Lab Results  Component Value Date   MICROALBUR <0.7 10/30/2019   LDLCALC 63 02/24/2020   CREATININE 1.44 08/22/2020    Lab Results  Component Value Date   FRUCTOSAMINE 288 (H) 05/24/2020   FRUCTOSAMINE 284 08/26/2019   FRUCTOSAMINE 284 03/29/2017    OTHER active problems addressed today: See review of systems  Lab on 08/22/2020  Component Date Value Ref Range Status  . Sodium 08/22/2020  139  135 - 145 mEq/L Final  . Potassium 08/22/2020 3.6  3.5 - 5.1 mEq/L Final  . Chloride 08/22/2020 106  96 - 112 mEq/L Final  . CO2 08/22/2020 25  19 - 32 mEq/L Final  . Glucose, Bld 08/22/2020 159* 70 - 99 mg/dL Final  . BUN 08/22/2020 21  6 - 23 mg/dL Final  . Creatinine, Ser 08/22/2020 1.44  0.40 - 1.50 mg/dL Final  . GFR 08/22/2020 45.26* >60.00 mL/min Final   Calculated using the CKD-EPI Creatinine Equation (2021)  . Calcium 08/22/2020 9.0  8.4 - 10.5 mg/dL Final  . Hgb A1c MFr Bld 08/22/2020 7.3* 4.6 - 6.5 % Final   Glycemic Control Guidelines for People with Diabetes:Non Diabetic:  <6%Goal of Therapy: <7%Additional Action Suggested:  >8%      Allergies as of 08/24/2020   No Known Allergies     Medication List       Accurate as of August 24, 2020  9:41 AM. If you have any questions, ask your nurse or doctor.         amLODipine 5 MG tablet Commonly known as: NORVASC Take 1 tablet (5 mg total) by mouth daily.   aspirin 81 MG tablet Take 81 mg by mouth daily.   FreeStyle Freedom Lite w/Device Kit Use Freestyle Freedom Lite meter to check blood sugar twice daily. DX:E11.65   freestyle lancets USE AS INSTRUCTED TO CHECK BLOOD SUGAR TWICE A DAY   FREESTYLE LITE test strip Generic drug: glucose blood USE TO CHECK BLOOD SUGAR TWICE A DAY AS INSTRUCTED   gabapentin 100 MG capsule Commonly known as: NEURONTIN Take 200-300 mg by mouth at bedtime as needed.   Jardiance 25 MG Tabs tablet Generic drug: empagliflozin Take 12.5 mg by mouth daily.   losartan 100 MG tablet Commonly known as: COZAAR Take 0.5 tablets (50 mg total) by mouth daily.   metFORMIN 1000 MG tablet Commonly known as: GLUCOPHAGE Take 500 mg by mouth daily with breakfast.   metoprolol succinate 100 MG 24 hr tablet Commonly known as: Toprol XL Take 1 tablet (100 mg total) by mouth daily.   multivitamin with minerals Tabs tablet Take 1 tablet by mouth daily.   Proventil HFA 108 (90 Base) MCG/ACT inhaler Generic drug: albuterol Inhale 2 puffs into the lungs every 6 (six) hours as needed for wheezing or shortness of breath.   simvastatin 20 MG tablet Commonly known as: ZOCOR Take 20 mg by mouth at bedtime.   Stiolto Respimat 2.5-2.5 MCG/ACT Aers Generic drug: Tiotropium Bromide-Olodaterol Inhale 2 puffs into the lungs daily as needed (for flares).   triamterene-hydrochlorothiazide 37.5-25 MG tablet Commonly known as: MAXZIDE-25 Take 1 tablet by mouth daily.   Trulicity 3 TG/6.2IR Sopn Generic drug: Dulaglutide INJECT 3 MG UNDER THE SKIN ONCE A WEEK       Allergies: No Known Allergies  Past Medical History:  Diagnosis Date  . CAD (coronary artery disease)    Two RCA stents remotely / 3rd RCA stent 2006  . Colon polyps    s/p several Cscopes.  Marland Kitchen COPD (chronic obstructive pulmonary disease) (HCC)    on O2,  nocturnal  . Diabetes mellitus    dx aprox 2009  . ED (erectile dysfunction)    has a vacumm device  . Ejection fraction   . Hyperlipidemia    dx in 90s  . Hypertension    dx in the 90  . Hypogonadism male   . PVD (peripheral vascular disease) ([place])  s/p stents at LE 2009, Dr Einar Gip  . Shortness of breath    O2 Sat dropped to 82% walking on the treadmill, September, 2012    Past Surgical History:  Procedure Laterality Date  . APPENDECTOMY    . TONSILLECTOMY      Family History  Problem Relation Age of Onset  . Heart disease Father   . Hypertension Father   . Stroke Father   . Diabetes Paternal Aunt   . Diabetes Maternal Grandmother   . Diabetes Other        GM, nephews, many family members  . Hyperlipidemia Other        ?  . Prostate cancer Brother   . Colon cancer Neg Hx     Social History:  reports that he quit smoking about 43 years ago. His smoking use included cigarettes. He has a 60.00 pack-year smoking history. He has never used smokeless tobacco. He reports current alcohol use. He reports that he does not use drugs.    Review of Systems        Lipids: He has been on treatment with simvastatin 20 mg with good control as follows       Lab Results  Component Value Date   CHOL 117 02/24/2020   HDL 37.00 (L) 02/24/2020   LDLCALC 63 02/24/2020   LDLDIRECT 55.0 01/01/2018   TRIG 87.0 02/24/2020   CHOLHDL 3 02/24/2020                   HYPOGONADISM He had decreased libido and erectile dysfunction previously and was found to have hypogonadism, probably in 2011. He did have a slightly low free testosterone level in 2012 on treatment Prolactin level normal  Previously on AndroGel With his hemoglobin going up to 19.1 in August 2019 he has been told not to take any testosterone He was given clomiphene as a trial but he did not pick up the prescription for unknown reasons  Testosterone level is still low without testosterone therapy but he is not  complaining of any unusual fatigue   Lab Results  Component Value Date   TESTOSTERONE 215.23 (L) 05/24/2020   TESTOSTERONE 257.22 (L) 03/10/2020   TESTOSTERONE 210.21 (L) 06/30/2019     He had been evaluated by hematologist for his erythrocytosis Although hemoglobin had come back normal it is slightly higher in 9/21 without any use of testosterone He has not had a follow-up with his PCP for this   CBC Latest Ref Rng & Units 05/24/2020 02/08/2020 11/19/2019  WBC 4.0 - 10.5 K/uL 6.3 7.8 8.1  Hemoglobin 13.0 - 17.0 g/dL 17.7(H) 16.2 17.8(H)  Hematocrit 39 - 52 % 52.6(H) 48.3 53.2(H)  Platelets 150 - 400 K/uL 190.0 193.0 220          HYPERTENSION: This is followed by PCP and is on 50 mg of losartan along with half Maxide, amlodipine and metoprolol  His amlodipine was reduced to 5 mg by PCP He is checking at home also  BP Readings from Last 3 Encounters:  08/24/20 122/70  08/02/20 120/63  06/28/20 91/60   RENAL dysfunction: His creatinine is borderline and somewhat variable  Lab Results  Component Value Date   CREATININE 1.44 08/22/2020   CREATININE 1.53 (H) 05/24/2020   CREATININE 1.45 03/10/2020   NEUROPATHY: He has had persistent sensory loss in his distal feet No discomfort currently  LABS:  Lab on 08/22/2020  Component Date Value Ref Range Status  . Sodium 08/22/2020 139  135 -  145 mEq/L Final  . Potassium 08/22/2020 3.6  3.5 - 5.1 mEq/L Final  . Chloride 08/22/2020 106  96 - 112 mEq/L Final  . CO2 08/22/2020 25  19 - 32 mEq/L Final  . Glucose, Bld 08/22/2020 159* 70 - 99 mg/dL Final  . BUN 08/22/2020 21  6 - 23 mg/dL Final  . Creatinine, Ser 08/22/2020 1.44  0.40 - 1.50 mg/dL Final  . GFR 08/22/2020 45.26* >60.00 mL/min Final   Calculated using the CKD-EPI Creatinine Equation (2021)  . Calcium 08/22/2020 9.0  8.4 - 10.5 mg/dL Final  . Hgb A1c MFr Bld 08/22/2020 7.3* 4.6 - 6.5 % Final   Glycemic Control Guidelines for People with Diabetes:Non Diabetic:  <6%Goal  of Therapy: <7%Additional Action Suggested:  >8%     Physical Examination:  BP 122/70   Pulse 67   Ht '6\' 2"'  (1.88 m)   Wt 231 lb (104.8 kg)   SpO2 96%   BMI 29.66 kg/m   Diabetic Foot Exam - Simple   Simple Foot Form Diabetic Foot exam was performed with the following findings: Yes 08/24/2020  9:51 AM  Visual Inspection See comments: Yes Sensation Testing See comments: Yes Pulse Check See comments: Yes Comments Mild deformities of toes especially left Monofilament sensation absent on distal toes and on the plantar surface of the foot but Posterior tibialis pulses not palpable, dorsalis pedis pulses 2/4        ASSESSMENT/PLAN:   Diabetes type 2, with mild obesity, non-insulin-dependent  His A1c is 7.3, previously 7.2  Considering his age his blood sugar control level is adequate Not clear if his home monitor is accurate but his fasting readings are generally below 140 on the lab glucose 159 He has a reading of 165 after meals but needs to check more readings postprandially to help assess his diet and need to adjust his medication regimen Currently on 3 mg Trulicity   HYPERTENSION: Blood pressure is well controlled with current regimen   HYPOGONADISM:  He does not appear to be symptomatic with any fatigue Because of his erythrocytosis treatment is currently contraindicated  Mild erythrocytosis: He will need to follow-up with PCP, has not being followed by hematologist   There are no Patient Instructions on file for this visit.    Elayne Snare 08/24/2020, 9:41 AM   Note: This office note was prepared with Dragon voice recognition system technology. Any transcriptional errors that result from this process are unintentional.

## 2020-08-29 ENCOUNTER — Telehealth: Payer: Self-pay | Admitting: Endocrinology

## 2020-08-29 NOTE — Telephone Encounter (Signed)
Patient came into the office in ask if we could please send his device and supplies to [facility] on [address] rather than the [employer].   Blood Glucose Monitoring Suppl (FREESTYLE FREEDOM LITE) w/Device KIT [[id number]]

## 2020-08-30 NOTE — Telephone Encounter (Signed)
Patient needs a prior authorization before I can send these in.

## 2020-09-05 ENCOUNTER — Telehealth: Payer: Self-pay | Admitting: Internal Medicine

## 2020-09-05 DIAGNOSIS — J9611 Chronic respiratory failure with hypoxia: Secondary | ICD-10-CM

## 2020-09-05 NOTE — Telephone Encounter (Signed)
Attempted to call patient X2, line rang several times and then hung up.   Need to know what equipment he is wanting returned- at last office visit pt was to wear 2lpm O2 qhs.

## 2020-09-05 NOTE — Telephone Encounter (Signed)
I just spoke with Jonathan [name] he thought he had been using [facility] which moved.  I told him yes that was correct and he wanted their phone #  [phone].  I also told him that they are also with [employer]. Nothing further needed

## 2020-09-05 NOTE — Telephone Encounter (Signed)
Spoke with the pt  He states wanting order to have [employer] pick up o2 concentrator  He has not used o2 in the past few months since the provider advised he did not need after walking him in clinic  I advised since he only uses at night, we most likely will need ONO done on RA to see if he still needs it or if safe to d/c  Pt verbalized understanding of this  Dr Annamaria Boots- please advise, thanks!

## 2020-09-05 NOTE — Telephone Encounter (Signed)
Spoke with the pt and notified of response per Dr Annamaria Boots  He verbalized understanding  Order for ONO was placed

## 2020-09-05 NOTE — Telephone Encounter (Signed)
Pt states that it is his O2 machine - states that the [place] said not to use it anymore - states he went to the [place] 3-4 months ago and the Dr. There said to stop using it -

## 2020-09-05 NOTE — Telephone Encounter (Signed)
Please order overnight oximetry on room air This will tell us if he should still use O2 while sleeping. Ask him to call for results a week or so after this test.

## 2020-10-17 ENCOUNTER — Encounter: Payer: Self-pay | Admitting: Internal Medicine

## 2020-10-17 ENCOUNTER — Other Ambulatory Visit: Payer: Self-pay

## 2020-10-17 ENCOUNTER — Ambulatory Visit (INDEPENDENT_AMBULATORY_CARE_PROVIDER_SITE_OTHER): Payer: Medicare Other | Admitting: Internal Medicine

## 2020-10-17 VITALS — BP 128/56 | HR 103 | Temp 98.0°F | Resp 18 | Ht 74.0 in | Wt 234.1 lb

## 2020-10-17 DIAGNOSIS — E1140 Type 2 diabetes mellitus with diabetic neuropathy, unspecified: Secondary | ICD-10-CM | POA: Diagnosis not present

## 2020-10-17 DIAGNOSIS — R269 Unspecified abnormalities of gait and mobility: Secondary | ICD-10-CM | POA: Diagnosis not present

## 2020-10-17 MED ORDER — GABAPENTIN 300 MG PO CAPS: ORAL_CAPSULE | ORAL | 1 refills | Status: DC

## 2020-10-17 NOTE — Progress Notes (Unsigned)
Pre visit review using our clinic review tool, if applicable. No additional management support is needed unless otherwise documented below in the visit note. 

## 2020-10-17 NOTE — Progress Notes (Signed)
Subjective:    Patient ID: Jonathan Leather., male    DOB: 07/29/1936, 85 y.o.   MRN: [id number]  DOS:  10/17/2020 Type of visit - description: Follow-up  Since the last office visit is doing well. He like it to discussed neuropathy, continue to be a problem for him: From the ankles down his feet he has a feeling of cold, tingly, numb. Also he feels a slightly off balance but denies headache, dizziness or any recent falls. Ambulatory BPs are very good.  Review of Systems See above   Past Medical History:  Diagnosis Date  . CAD (coronary artery disease)    Two RCA stents remotely / 3rd RCA stent 2006  . Colon polyps    s/p several Cscopes.  Marland Kitchen COPD (chronic obstructive pulmonary disease) (HCC)    on O2, nocturnal  . Diabetes mellitus    dx aprox 2009  . ED (erectile dysfunction)    has a vacumm device  . Ejection fraction   . Hyperlipidemia    dx in 90s  . Hypertension    dx in the 90  . Hypogonadism male   . PVD (peripheral vascular disease) ([place])    s/p stents at LE 2009, Dr Einar Gip  . Shortness of breath    O2 Sat dropped to 82% walking on the treadmill, September, 2012    Past Surgical History:  Procedure Laterality Date  . APPENDECTOMY    . TONSILLECTOMY      Allergies as of 10/17/2020   No Known Allergies     Medication List       Accurate as of October 17, 2020 11:59 PM. If you have any questions, ask your nurse or doctor.        albuterol 108 (90 Base) MCG/ACT inhaler Commonly known as: VENTOLIN HFA Inhale 2 puffs into the lungs every 6 (six) hours as needed for wheezing or shortness of breath.   amLODipine 5 MG tablet Commonly known as: NORVASC Take 1 tablet (5 mg total) by mouth daily.   aspirin 81 MG tablet Take 81 mg by mouth daily.   FreeStyle Freedom Lite w/Device Kit Use Freestyle Freedom Lite meter to check blood sugar twice daily. DX:E11.65   freestyle lancets USE AS INSTRUCTED TO CHECK BLOOD SUGAR TWICE A DAY   FREESTYLE LITE  test strip Generic drug: glucose blood USE TO CHECK BLOOD SUGAR TWICE A DAY AS INSTRUCTED   gabapentin 300 MG capsule Commonly known as: NEURONTIN 1 tab at night x 1 week, then 1 tab BID What changed:   medication strength  how much to take  how to take this  when to take this  reasons to take this  additional instructions Changed by: Kathlene November, MD   Jardiance 25 MG Tabs tablet Generic drug: empagliflozin Take 12.5 mg by mouth daily.   losartan 100 MG tablet Commonly known as: COZAAR Take 0.5 tablets (50 mg total) by mouth daily.   metFORMIN 1000 MG tablet Commonly known as: GLUCOPHAGE Take 500 mg by mouth daily with breakfast.   metoprolol succinate 100 MG 24 hr tablet Commonly known as: Toprol XL Take 1 tablet (100 mg total) by mouth daily.   multivitamin with minerals Tabs tablet Take 1 tablet by mouth daily.   simvastatin 20 MG tablet Commonly known as: ZOCOR Take 20 mg by mouth at bedtime.   Stiolto Respimat 2.5-2.5 MCG/ACT Aers Generic drug: Tiotropium Bromide-Olodaterol Inhale 2 puffs into the lungs daily as needed (for flares).  triamterene-hydrochlorothiazide 37.5-25 MG tablet Commonly known as: MAXZIDE-25 Take 1 tablet by mouth daily.   Trulicity 3 ZO/1.0RU Sopn Generic drug: Dulaglutide INJECT 3 MG UNDER THE SKIN ONCE A WEEK          Objective:   Physical Exam BP (!) 128/56 (BP Location: Left Arm, Patient Position: Sitting, Cuff Size: Normal)   Pulse (!) 103   Temp 98 F (36.7 C) (Oral)   Resp 18   Ht '6\' 2"'  (1.88 m)   Wt 234 lb 2 oz (106.2 kg)   SpO2 93%   BMI 30.06 kg/m  General:   Well developed, NAD, BMI noted. HEENT:  Normocephalic . Face symmetric, atraumatic Lungs:  CTA B Normal respiratory effort, no intercostal retractions, no accessory muscle use. Heart: RRR,  no murmur.  DM foot exam: Normal pulses, pinprick examination: + Numbness, mostly midfoot distally Skin: Not pale. Not jaundice Neurologic:  alert &  oriented X3.  Speech normal, gait appropriate for age and unassisted Psych--  Cognition and judgment appear intact.  Cooperative with normal attention span and concentration.  Behavior appropriate. No anxious or depressed appearing.      Assessment      Assessment DM dx ~0454, complicated by CAD, PVD, mild neuropathy. Sees Dr. Dwyane Dee Hypogonadism. Sees Dr. Dwyane Dee HTN Hyperlipidemia COPD, nocturnal O2 prn Back pain, chronic: See visit 10/12/2016 CV: -CAD, Dr. Ron Parker Dr Meda Coffee S/p  stenting 2 to the RCA remote past and the third RCA stent in  2006, negative stress test in 2012, LVEF 55% on echo 2015 -Peripheral vascular disease, stents lower extremity 2009  ED Polycythemia: Saw hematology 11-2018, felt to be due to chronic respiratory failure.  Not likely polycythemia vera  PLAN    DM: Per Endo Diabetic neuropathy: As described above, exam is confirmatory, vascular exam normal.  Was prescribed gabapentin by the [place] but is actually not taking it. He does get excellent nail care at the [place]. Plan: Discussed feet care, start gabapentin 300 mg twice daily, see AVS. Gait disorder: He feels imbalance, likely due to neuropathy, fall prevention discussed, recommend a cane Preventive care: Had COVID booster.  Had a flu shot.  RTC 4 months   This visit occurred during the SARS-CoV-2 public health emergency.  Safety protocols were in place, including screening questions prior to the visit, additional usage of staff PPE, and extensive cleaning of exam room while observing appropriate contact time as indicated for disinfecting solutions.

## 2020-10-17 NOTE — Patient Instructions (Addendum)
Continue checking your blood pressure regularly BP GOAL is between 110/65 and  135/85. If it is consistently higher or lower, let me know   For neuropathy: Gabapentin 300 mg: 1 tablet at bedtime for 1 week, then increase to 1 tablet twice a day Call if that is not helping Consider using a cane   GO TO THE FRONT DESK, [place] back for a checkup in 4 months    Fall Prevention in the Home, Adult Falls can cause injuries and can affect people from all age groups. There are many simple things that you can do to make your home safe and to help prevent falls. Ask for help when making these changes, if needed. What actions can I take to prevent falls? General instructions  Use good lighting in all rooms. Replace any light bulbs that burn out.  Turn on lights if it is dark. Use night-lights.  Place frequently used items in easy-to-reach places. Lower the shelves around your home if necessary.  Set up furniture so that there are clear paths around it. Avoid moving your furniture around.  Remove throw rugs and other tripping hazards from the floor.  Avoid walking on wet floors.  Fix any uneven floor surfaces.  Add color or contrast paint or tape to grab bars and handrails in your home. Place contrasting color strips on the first and last steps of stairways.  When you use a stepladder, make sure that it is completely opened and that the sides are firmly locked. Have someone hold the ladder while you are using it. Do not climb a closed stepladder.  Be aware of any and all pets. What can I do in the bathroom?  Keep the floor dry. Immediately clean up any water that spills onto the floor.  Remove soap buildup in the tub or shower on a regular basis.  Use non-skid mats or decals on the floor of the tub or shower.  Attach bath mats securely with double-sided, non-slip rug tape.  If you need to sit down while you are in the shower, use a plastic,  non-slip stool.  Install grab bars by the toilet and in the tub and shower. Do not use towel bars as grab bars.      What can I do in the bedroom?  Make sure that a bedside light is easy to reach.  Do not use oversized bedding that drapes onto the floor.  Have a firm chair that has side arms to use for getting dressed. What can I do in the kitchen?  Clean up any spills right away.  If you need to reach for something above you, use a sturdy step stool that has a grab bar.  Keep electrical cables out of the way.  Do not use floor polish or wax that makes floors slippery. If you must use wax, make sure that it is non-skid floor wax. What can I do in the stairways?  Do not leave any items on the stairs.  Make sure that you have a light switch at the top of the stairs and the bottom of the stairs. Have them installed if you do not have them.  Make sure that there are handrails on both sides of the stairs. Fix handrails that are broken or loose. Make sure that handrails are as long as the stairways.  Install non-slip stair treads on all stairs in your home.  Avoid having throw rugs at the top or bottom of stairways, or  secure the rugs with carpet tape to prevent them from moving.  Choose a carpet design that does not hide the edge of steps on the stairway.  Check any carpeting to make sure that it is firmly attached to the stairs. Fix any carpet that is loose or worn. What can I do on the outside of my home?  Use bright outdoor lighting.  Regularly repair the edges of walkways and driveways and fix any cracks.  Remove high doorway thresholds.  Trim any shrubbery on the main path into your home.  Regularly check that handrails are securely fastened and in good repair. Both sides of any steps should have handrails.  Install guardrails along the edges of any raised decks or porches.  Clear walkways of debris and clutter, including tools and rocks.  Have leaves, snow, and  ice cleared regularly.  Use sand or salt on walkways during winter months.  In the garage, clean up any spills right away, including grease or oil spills. What other actions can I take?  Wear closed-toe shoes that fit well and support your feet. Wear shoes that have rubber soles or low heels.  Use mobility aids as needed, such as canes, walkers, scooters, and crutches.  Review your medicines with your health care provider. Some medicines can cause dizziness or changes in blood pressure, which increase your risk of falling. Talk with your health care provider about other ways that you can decrease your risk of falls. This may include working with a physical therapist or trainer to improve your strength, balance, and endurance. Where to find more information  Centers for Disease Control and Prevention, [facility]: WebmailGuide.co.za  [employer] on Aging: BrainJudge.co.uk Contact a health care provider if:  You are afraid of falling at home.  You feel weak, drowsy, or dizzy at home.  You fall at home. Summary  There are many simple things that you can do to make your home safe and to help prevent falls.  Ways to make your home safe include removing tripping hazards and installing grab bars in the bathroom.  Ask for help when making these changes in your home. This information is not intended to replace advice given to you by your health care provider. Make sure you discuss any questions you have with your health care provider. Document Revised: 08/16/2017 Document Reviewed: 04/18/2017 Elsevier Patient Education  2021 [employer].

## 2020-10-18 NOTE — Assessment & Plan Note (Signed)
DM: Per Endo Diabetic neuropathy: As described above, exam is confirmatory, vascular exam normal.  [facility] but [facility] not taking [facility]. He does get excellent nail care at the [place]. Plan: Discussed feet care, start gabapentin 300 mg twice daily, see AVS. Gait disorder: He feels imbalance, likely due to neuropathy, fall prevention discussed, recommend a cane Preventive care: Had COVID booster.  Had a flu shot.  RTC 4 months

## 2020-10-20 ENCOUNTER — Other Ambulatory Visit: Payer: Self-pay

## 2020-10-20 DIAGNOSIS — J449 Chronic obstructive pulmonary disease, unspecified: Secondary | ICD-10-CM

## 2020-10-20 DIAGNOSIS — J9611 Chronic respiratory failure with hypoxia: Secondary | ICD-10-CM

## 2020-10-20 DIAGNOSIS — J9811 Atelectasis: Secondary | ICD-10-CM

## 2020-10-30 NOTE — Progress Notes (Signed)
HPI male former smoker followed for COPD, chronic respiratory failure with hypoxia, complicated by CAD/MI/ Stents/ PAD, DM2, polycythemia PFT- PFT 08/13/18- Diffusion moderately reduced. Normal spirometry flows and lung volumes10/23/12-  Normal spirometry flows with small- airway response to bronchodilator, normal lung volumes and moderately reduced diffusion. FEV1/FVC 0.79, DLCO 58% 6MWT -06/18/12- 96%, 88%, 94% 449 M. Significant desaturation with exercise. Overnight Oximetry 02/07/17-room air-sustained desaturation less than or equal to 88% for over 7 hours Walk Test 2019-   ------------------------------------------------------------------------------------------------------------ 10/30/19- 85 year old male former smoker (60 pk yr) followed for COPD, chronic respiratory failure with hypoxia,, complicated by CAD/MI/ stents/ PAD, DM2, Polycythemia O2 2 L/ [facility]-sleep and exertion Daily Stiolto 2.5. Rescue inhaler not more than once/ week, and not needed hfa in 3 months. Stiolto works well Last CXR showed RUL atelectasis in a former smoker. He denies cough, wheeze Has had flu vax and first covid vax. CXR 04/13/19-  Subsegmental atelectasis RIGHT upper lobe.  10/31/20-85 year old male former smoker (60 pk yr) followed for COPD, chronic respiratory failure with hypoxia,, complicated by CAD/MI/ stents/ PAD, DM2, Polycythemia O2 2 L/ [facility]-sleep and exertion Body weight today 234 lbs Covid vax-3 Moderna Flu vax-had Albuterol HFA, Stiolto 2.5, amoxicillin to hold, Zyrtec -----Patient is feeling good overall, no concerns at this time Arrival O2 sat 95% at rest. Has not felt need to use inhalers. Not using oxygen. [facility] told him he was doing well.  No report from [place] by [facility] CXR 10/30/19-  IMPRESSION: No active cardiopulmonary disease.   ROS-see HPI  + = positive Constitutional:   No-   weight loss, night sweats, fevers, chills, fatigue, lassitude. HEENT:   No-  headaches, difficulty swallowing,  tooth/dental problems, sore throat,       No-  sneezing, itching, ear ache, nasal congestion, post nasal drip,  CV:  No-   chest pain, orthopnea, PND, swelling in lower extremities, anasarca, dizziness, palpitations,                 Claudication R thigh Resp:   + "shortness of breath with exertion or at rest.              No-   productive cough,  No non-productive cough,  No- coughing up of blood.              No-   change in color of mucus.   wheezing.   Skin: No-   rash or lesions. GI:  No-   heartburn, indigestion, abdominal pain, nausea, vomiting, GU:  MS:  No-   joint pain or swelling.   Neuro-     nothing unusual Psych:  No- change in mood or affect. No depression or anxiety.  No memory loss.  OBJ- Physical Exam     General- Alert, Oriented, Affect-appropriate, Distress- none acute., looks fit Skin- rash-none, lesions- none, excoriation- none Lymphadenopathy-  Head- atraumatic            Eyes- Gross vision intact, PERRLA, conjunctivae and secretions clear            Ears- Hearing, canals-normal            Nose- Clear, no-Septal dev, mucus, polyps, erosion, perforation             Throat- Mallampati II , mucosa clear , drainage- none, tonsils- atrophic Neck- flexible , trachea midline, no stridor , thyroid nl, carotid no bruit Chest - symmetrical excursion , unlabored           Heart/CV- RRR ,  no murmur , no gallop  , no rub, nl s1 s2                           - JVD none , edema- none, stasis changes- none, varices- none           Lung- +clear, cough-none , dullness-none, rub- none,            Chest wall-  Abd-  Br/ Gen/ Rectal- Not done, not indicated Extrem- cyanosis- none,  atrophy- none, strength- nl  Neuro- grossly intact to observation

## 2020-10-31 ENCOUNTER — Encounter: Payer: Self-pay | Admitting: Internal Medicine

## 2020-10-31 ENCOUNTER — Other Ambulatory Visit: Payer: Self-pay

## 2020-10-31 ENCOUNTER — Ambulatory Visit (INDEPENDENT_AMBULATORY_CARE_PROVIDER_SITE_OTHER): Payer: Medicare Other | Admitting: Internal Medicine

## 2020-10-31 DIAGNOSIS — J9611 Chronic respiratory failure with hypoxia: Secondary | ICD-10-CM | POA: Diagnosis not present

## 2020-10-31 DIAGNOSIS — J449 Chronic obstructive pulmonary disease, unspecified: Secondary | ICD-10-CM | POA: Diagnosis not present

## 2020-10-31 NOTE — Patient Instructions (Signed)
We will track down result of overnight oximetry test done through [facility]. Then we can either discontinue your home oxygen or talk with you about continuing to use it.  Please call if we can help

## 2020-11-01 ENCOUNTER — Telehealth: Payer: Self-pay

## 2020-11-01 NOTE — Telephone Encounter (Signed)
10/31/20-  Called [facility] ([facility]) to get copy of ONO results to see if we can discontinue his oxygen. I was advised that the machine was dropped off to the patient but they are not showing that it was ever picked up and the person that was supposed to drop off and pick up no longer works there. They were going to reach out to patient to see if he still had the machine or if it was picked up.   11/01/20-  Called [facility] to see if there was any updates in regards to ONO for patient. Was informed that they think patient did not wear it overnight and that they are going to retest him. Order was placed.

## 2020-11-02 ENCOUNTER — Ambulatory Visit: Payer: Medicare Other | Admitting: Internal Medicine

## 2020-11-15 ENCOUNTER — Other Ambulatory Visit: Payer: Self-pay

## 2020-11-15 ENCOUNTER — Other Ambulatory Visit (INDEPENDENT_AMBULATORY_CARE_PROVIDER_SITE_OTHER): Payer: Medicare Other

## 2020-11-15 DIAGNOSIS — E1165 Type 2 diabetes mellitus with hyperglycemia: Secondary | ICD-10-CM

## 2020-11-15 DIAGNOSIS — D751 Secondary polycythemia: Secondary | ICD-10-CM | POA: Diagnosis not present

## 2020-11-15 LAB — BASIC METABOLIC PANEL
BUN: 20 mg/dL (ref 6–23)
CO2: 27 mEq/L (ref 19–32)
Calcium: 9.7 mg/dL (ref 8.4–10.5)
Chloride: 105 mEq/L (ref 96–112)
Creatinine, Ser: 1.46 mg/dL (ref 0.40–1.50)
GFR: 44.44 mL/min — ABNORMAL LOW (ref >60.00)
Glucose, Bld: 102 mg/dL — ABNORMAL HIGH (ref 70–99)
Potassium: 3.9 mEq/L (ref 3.5–5.1)
Sodium: 139 mEq/L (ref 135–145)

## 2020-11-15 LAB — MICROALBUMIN / CREATININE URINE RATIO
Creatinine,U: 125 mg/dL
Microalb Creat Ratio: 0.6 mg/g (ref 0.0–30.0)
Microalb, Ur: <0.7 mg/dL (ref 0.0–1.9)

## 2020-11-15 LAB — CBC
HCT: 50.4 % (ref 39.0–52.0)
Hemoglobin: 17.2 g/dL — ABNORMAL HIGH (ref 13.0–17.0)
MCHC: 34.1 g/dL (ref 30.0–36.0)
MCV: 97.5 fl (ref 78.0–100.0)
Platelets: 201 10*3/uL (ref 150.0–400.0)
RBC: 5.17 Mil/uL (ref 4.22–5.81)
RDW: 15.1 % (ref 11.5–15.5)
WBC: 8 10*3/uL (ref 4.0–10.5)

## 2020-11-15 LAB — HEMOGLOBIN A1C: Hgb A1c MFr Bld: 7.1 % — ABNORMAL HIGH (ref 4.6–6.5)

## 2020-11-17 ENCOUNTER — Other Ambulatory Visit: Payer: Medicare Other

## 2020-11-23 ENCOUNTER — Ambulatory Visit (INDEPENDENT_AMBULATORY_CARE_PROVIDER_SITE_OTHER): Payer: Medicare Other | Admitting: Endocrinology

## 2020-11-23 ENCOUNTER — Encounter: Payer: Self-pay | Admitting: Endocrinology

## 2020-11-23 ENCOUNTER — Other Ambulatory Visit: Payer: Self-pay

## 2020-11-23 VITALS — BP 122/74 | HR 74 | Ht 74.0 in | Wt 235.0 lb

## 2020-11-23 DIAGNOSIS — E1165 Type 2 diabetes mellitus with hyperglycemia: Secondary | ICD-10-CM

## 2020-11-23 DIAGNOSIS — I10 Essential (primary) hypertension: Secondary | ICD-10-CM | POA: Diagnosis not present

## 2020-11-23 DIAGNOSIS — D751 Secondary polycythemia: Secondary | ICD-10-CM | POA: Diagnosis not present

## 2020-11-23 MED ORDER — EMPAGLIFLOZIN 10 MG PO TABS: 10.0000 mg | ORAL_TABLET | Freq: Every day | ORAL | 3 refills | Status: DC

## 2020-11-23 NOTE — Progress Notes (Signed)
Patient ID: Jonathan Leather., male   DOB: [birth year], 85 y.o.   MRN: [id number]    Reason for Appointment:  Follow-up  History of Present Illness:          Diagnosis: Type 2 diabetes mellitus, date of diagnosis:  2009      Past history: He is not clear when his diabetes was diagnosed but according to hospital records it may have been in 2009. Most likely he was placed on metformin initially and at some point Amaryl added. In 2013 it was also given Januvia presumably to improve his control. However appears that his A1c has been consistently over 7% Janumet was started instead of metformin and Januvia separately in 2013  He was started on Trulicity in 4/09 instead of Victoza because excessive bruising on his abdomen  Later in 01/2015 he was switched to Bydureon because of insurance noncoverage of his Trulicity  Recent history:   Non-insulin hypoglycemic drugs are: Metformin 811 2x a day, Trulicity 3.0  mg weekly, Jardiance 12.5 mg daily  His A1c is about the same at 7.1  Fructosamine previously 288  Current blood sugar patterns and problems:  His blood sugars are generally fairly stable in the mornings and averaging about 130  He does sporadic blood sugar later in the day but usually not after meals  Highest blood sugar 188 likely after dinner  His meter is not programmed to the right time and difficult to analyze  Although he has not lost any weight he thinks he is not eating out as much and trying to have more meals at home  Also 2-3 times a week he may be able to get on a treadmill for exercise  No side effects with Trulicity  He is [facility]ardiance [facility] and prefers to not cut the pills in half Renal function stable    Side effects from medications have been: None  Glucose monitoring:  done usually once a day       Glucometer:  FreeStyle  Blood Glucose readings     PRE-MEAL  morning Lunch Dinner Bedtime Overall  Glucose  range:  112-173  146  131    Mean/median:     136   POST-MEAL PC Breakfast PC Lunch PC Dinner  Glucose range:    188  Mean/median:        FASTING range previously 116-142 and overall average 129     Meals: 3 meals per day. eating egg/meat bread for breakfast, dinner 6-7 PM     Dietician visit: Most recent: 4/16 .               Weight history:  Wt Readings from Last 3 Encounters:  11/23/20 235 lb (106.6 kg)  10/31/20 234 lb 9.6 oz (106.4 kg)  10/17/20 234 lb 2 oz (106.2 kg)   Glycemic control:   Lab Results  Component Value Date   HGBA1C 7.1 (H) 11/15/2020   HGBA1C 7.3 (H) 08/22/2020   HGBA1C 7.2 (H) 03/10/2020   Lab Results  Component Value Date   MICROALBUR <0.7 11/15/2020   LDLCALC 63 02/24/2020   CREATININE 1.46 11/15/2020    Lab Results  Component Value Date   FRUCTOSAMINE 288 (H) 05/24/2020   FRUCTOSAMINE 284 08/26/2019   FRUCTOSAMINE 284 03/29/2017    OTHER active problems addressed today: See review of systems  No visits with results within 1 Week(s) from this visit.  Latest known visit with results is:  Lab  on 11/15/2020  Component Date Value Ref Range Status  . WBC 11/15/2020 8.0  4.0 - 10.5 K/uL Final  . RBC 11/15/2020 5.17  4.22 - 5.81 Mil/uL Final  . Platelets 11/15/2020 201.0  150.0 - 400.0 K/uL Final  . Hemoglobin 11/15/2020 17.2* 13.0 - 17.0 g/dL Final  . HCT 11/15/2020 50.4  39.0 - 52.0 % Final  . MCV 11/15/2020 97.5  78.0 - 100.0 fl Final  . MCHC 11/15/2020 34.1  30.0 - 36.0 g/dL Final  . RDW 11/15/2020 15.1  11.5 - 15.5 % Final  . Microalb, Ur 11/15/2020 <0.7  0.0 - 1.9 mg/dL Final  . Creatinine,U 11/15/2020 125.0  mg/dL Final  . Microalb Creat Ratio 11/15/2020 0.6  0.0 - 30.0 mg/g Final  . Sodium 11/15/2020 139  135 - 145 mEq/L Final  . Potassium 11/15/2020 3.9  3.5 - 5.1 mEq/L Final  . Chloride 11/15/2020 105  96 - 112 mEq/L Final  . CO2 11/15/2020 27  19 - 32 mEq/L Final  . Glucose, Bld 11/15/2020 102* 70 - 99 mg/dL Final  . BUN  11/15/2020 20  6 - 23 mg/dL Final  . Creatinine, Ser 11/15/2020 1.46  0.40 - 1.50 mg/dL Final  . GFR 11/15/2020 44.44* >60.00 mL/min Final   Calculated using the CKD-EPI Creatinine Equation (2021)  . Calcium 11/15/2020 9.7  8.4 - 10.5 mg/dL Final  . Hgb A1c MFr Bld 11/15/2020 7.1* 4.6 - 6.5 % Final   Glycemic Control Guidelines for People with Diabetes:Non Diabetic:  <6%Goal of Therapy: <7%Additional Action Suggested:  >8%      Allergies as of 11/23/2020   No Known Allergies     Medication List       Accurate as of November 23, 2020  9:14 AM. If you have any questions, ask your nurse or doctor.        albuterol 108 (90 Base) MCG/ACT inhaler Commonly known as: VENTOLIN HFA Inhale 2 puffs into the lungs every 6 (six) hours as needed for wheezing or shortness of breath.   amLODipine 5 MG tablet Commonly known as: NORVASC Take 1 tablet (5 mg total) by mouth daily.   aspirin 81 MG tablet Take 81 mg by mouth daily.   FreeStyle Freedom Lite w/Device Kit Use Freestyle Freedom Lite meter to check blood sugar twice daily. DX:E11.65   freestyle lancets USE AS INSTRUCTED TO CHECK BLOOD SUGAR TWICE A DAY   FREESTYLE LITE test strip Generic drug: glucose blood USE TO CHECK BLOOD SUGAR TWICE A DAY AS INSTRUCTED   gabapentin 300 MG capsule Commonly known as: NEURONTIN 1 tab at night x 1 week, then 1 tab BID   Jardiance 25 MG Tabs tablet Generic drug: empagliflozin Take 12.5 mg by mouth daily.   losartan 100 MG tablet Commonly known as: COZAAR Take 0.5 tablets (50 mg total) by mouth daily.   metFORMIN 1000 MG tablet Commonly known as: GLUCOPHAGE Take 500 mg by mouth daily with breakfast.   metoprolol succinate 100 MG 24 hr tablet Commonly known as: Toprol XL Take 1 tablet (100 mg total) by mouth daily.   multivitamin with minerals Tabs tablet Take 1 tablet by mouth daily.   simvastatin 20 MG tablet Commonly known as: ZOCOR Take 20 mg by mouth at bedtime.   Stiolto  Respimat 2.5-2.5 MCG/ACT Aers Generic drug: Tiotropium Bromide-Olodaterol Inhale 2 puffs into the lungs daily as needed (for flares).   triamterene-hydrochlorothiazide 37.5-25 MG tablet Commonly known as: MAXZIDE-25 Take 1 tablet by mouth daily.  Trulicity 3 KC/0.0LK Sopn Generic drug: Dulaglutide INJECT 3 MG UNDER THE SKIN ONCE A WEEK       Allergies: No Known Allergies  Past Medical History:  Diagnosis Date  . CAD (coronary artery disease)    Two RCA stents remotely / 3rd RCA stent 2006  . Colon polyps    s/p several Cscopes.  Marland Kitchen COPD (chronic obstructive pulmonary disease) (HCC)    on O2, nocturnal  . Diabetes mellitus    dx aprox 2009  . ED (erectile dysfunction)    has a vacumm device  . Ejection fraction   . Hyperlipidemia    dx in 90s  . Hypertension    dx in the 90  . Hypogonadism male   . PVD (peripheral vascular disease) ([place])    s/p stents at LE 2009, Dr Einar Gip  . Shortness of breath    O2 Sat dropped to 82% walking on the treadmill, September, 2012    Past Surgical History:  Procedure Laterality Date  . APPENDECTOMY    . TONSILLECTOMY      Family History  Problem Relation Age of Onset  . Heart disease Father   . Hypertension Father   . Stroke Father   . Diabetes Paternal Aunt   . Diabetes Maternal Grandmother   . Diabetes Other        GM, nephews, many family members  . Hyperlipidemia Other        ?  . Prostate cancer Brother   . Colon cancer Neg Hx     Social History:  reports that he quit smoking about 43 years ago. His smoking use included cigarettes. He has a 60.00 pack-year smoking history. He has never used smokeless tobacco. He reports current alcohol use. He reports that he does not use drugs.    Review of Systems        Lipids: He has been on treatment with simvastatin 20 mg from his PCP below with good control as follows       Lab Results  Component Value Date   CHOL 117 02/24/2020   HDL 37.00 (L) 02/24/2020   LDLCALC 63  02/24/2020   LDLDIRECT 55.0 01/01/2018   TRIG 87.0 02/24/2020   CHOLHDL 3 02/24/2020                   HYPOGONADISM He had decreased libido and erectile dysfunction previously and was found to have hypogonadism, probably in 2011. He did have a slightly low free testosterone level in 2012 on treatment Prolactin level normal  Previously on AndroGel With his hemoglobin going up to 19.1 in August 2019 he has been told not to take any testosterone He was given clomiphene as a trial but he did not pick up the prescription for unknown reasons  Testosterone level is persistently low without testosterone therapy  As before he is not complaining of any unusual fatigue, he says he is tired at the end of the day because of working hard   Lab Results  Component Value Date   TESTOSTERONE 215.23 (L) 05/24/2020   TESTOSTERONE 257.22 (L) 03/10/2020   TESTOSTERONE 210.21 (L) 06/30/2019     He had been evaluated by hematologist for his erythrocytosis Although hemoglobin had come back normal it is slightly higher than normal again   CBC Latest Ref Rng & Units 11/15/2020 05/24/2020 02/08/2020  WBC 4.0 - 10.5 K/uL 8.0 6.3 7.8  Hemoglobin 13.0 - 17.0 g/dL 17.2(H) 17.7(H) 16.2  Hematocrit 39.0 - 52.0 % 50.4  52.6(H) 48.3  Platelets 150.0 - 400.0 K/uL 201.0 190.0 193.0          HYPERTENSION: This is followed by PCP and is on 50 mg of losartan along with half Maxide, amlodipine 5 mg and metoprolol   He is checking at home periodically  BP Readings from Last 3 Encounters:  11/23/20 122/74  10/31/20 126/70  10/17/20 (!) 128/56   RENAL dysfunction: His creatinine is borderline and somewhat variable  Lab Results  Component Value Date   CREATININE 1.46 11/15/2020   CREATININE 1.44 08/22/2020   CREATININE 1.53 (H) 05/24/2020   NEUROPATHY: He has had persistent sensory loss in his distal feet No discomfort currently  LABS:  No visits with results within 1 Week(s) from this visit.  Latest  known visit with results is:  Lab on 11/15/2020  Component Date Value Ref Range Status  . WBC 11/15/2020 8.0  4.0 - 10.5 K/uL Final  . RBC 11/15/2020 5.17  4.22 - 5.81 Mil/uL Final  . Platelets 11/15/2020 201.0  150.0 - 400.0 K/uL Final  . Hemoglobin 11/15/2020 17.2* 13.0 - 17.0 g/dL Final  . HCT 11/15/2020 50.4  39.0 - 52.0 % Final  . MCV 11/15/2020 97.5  78.0 - 100.0 fl Final  . MCHC 11/15/2020 34.1  30.0 - 36.0 g/dL Final  . RDW 11/15/2020 15.1  11.5 - 15.5 % Final  . Microalb, Ur 11/15/2020 <0.7  0.0 - 1.9 mg/dL Final  . Creatinine,U 11/15/2020 125.0  mg/dL Final  . Microalb Creat Ratio 11/15/2020 0.6  0.0 - 30.0 mg/g Final  . Sodium 11/15/2020 139  135 - 145 mEq/L Final  . Potassium 11/15/2020 3.9  3.5 - 5.1 mEq/L Final  . Chloride 11/15/2020 105  96 - 112 mEq/L Final  . CO2 11/15/2020 27  19 - 32 mEq/L Final  . Glucose, Bld 11/15/2020 102* 70 - 99 mg/dL Final  . BUN 11/15/2020 20  6 - 23 mg/dL Final  . Creatinine, Ser 11/15/2020 1.46  0.40 - 1.50 mg/dL Final  . GFR 11/15/2020 44.44* >60.00 mL/min Final   Calculated using the CKD-EPI Creatinine Equation (2021)  . Calcium 11/15/2020 9.7  8.4 - 10.5 mg/dL Final  . Hgb A1c MFr Bld 11/15/2020 7.1* 4.6 - 6.5 % Final   Glycemic Control Guidelines for People with Diabetes:Non Diabetic:  <6%Goal of Therapy: <7%Additional Action Suggested:  >8%     Physical Examination:  BP 122/74   Pulse 74   Ht '6\' 2"'  (1.88 m)   Wt 235 lb (106.6 kg)   SpO2 96%   BMI 30.17 kg/m       ASSESSMENT/PLAN:   Diabetes type 2, with mild obesity, non-insulin-dependent  His A1c is stable at 7.1  Currently on 3 mg Trulicity is also Jardiance and Metformin  He has only occasional high readings around 170-180 but generally well controlled However not checking enough readings after meals Slight improvement in A1c likely will be from him doing a little better with his diet  Overall level of control is adequate and will continue same regimen For  simplicity can use 10 mg Jardiance daily instead of breaking the 25 mg in half, to get [facility] from the [facility] At this time will avoid increasing the dose since creatinine is high normal   HYPERTENSION: Blood pressure is well controlled, also monitoring at home   HYPOGONADISM:  Has levels of testosterone in the 200 range but no significant fatigue Because of his persistent erythrocytosis treatment is currently contraindicated  Mild erythrocytosis: He will need to follow-up with PCP periodically   There are no Patient Instructions on file for this visit.    Elayne Snare 11/23/2020, 9:14 AM   Note: This office note was prepared with Dragon voice recognition system technology. Any transcriptional errors that result from this process are unintentional.

## 2020-12-12 ENCOUNTER — Other Ambulatory Visit (HOSPITAL_COMMUNITY): Payer: Self-pay | Admitting: Cardiovascular Disease

## 2020-12-12 DIAGNOSIS — Z959 Presence of cardiac and vascular implant and graft, unspecified: Secondary | ICD-10-CM

## 2020-12-16 ENCOUNTER — Other Ambulatory Visit: Payer: Self-pay | Admitting: Endocrinology

## 2020-12-16 DIAGNOSIS — Z23 Encounter for immunization: Secondary | ICD-10-CM | POA: Diagnosis not present

## 2020-12-23 ENCOUNTER — Telehealth: Payer: Self-pay | Admitting: Internal Medicine

## 2020-12-23 DIAGNOSIS — J9611 Chronic respiratory failure with hypoxia: Secondary | ICD-10-CM

## 2020-12-23 DIAGNOSIS — J449 Chronic obstructive pulmonary disease, unspecified: Secondary | ICD-10-CM

## 2020-12-23 NOTE — Telephone Encounter (Signed)
Called and spoke with pt to see when he did the ONO and he said it was done about 2 months ago and he was still waiting on the results.  This was to see if his O2 at night could be discontinued.  Stated to pt that we would call DME to follow up on this and he verbalized understanding.   Called [facility] ([facility]) but was on hold for 10 minutes waiting for someone to come to the line but never was able to speak to anyone so hung up. Will hold encounter open so we can try to call [facility] again.

## 2020-12-26 NOTE — Telephone Encounter (Signed)
Tried to call [facility] ([facility]) again but after being on hold for 13 min, hung up. PCCs, is there any way you can help [place] out with this. What we need to see is if pt was ever able to have ONO performed and if so, can we have the results faxed to our office so Dr. Annamaria Boots can be able to review. Pt was wanting to get O2 discontinued.

## 2020-12-26 NOTE — Telephone Encounter (Signed)
I spoke to [facility] with [facility].  She advised that she will research the ONO & call me back with info on the ONO.

## 2021-01-02 DIAGNOSIS — J449 Chronic obstructive pulmonary disease, unspecified: Secondary | ICD-10-CM | POA: Diagnosis not present

## 2021-01-04 DIAGNOSIS — N401 Enlarged prostate with lower urinary tract symptoms: Secondary | ICD-10-CM | POA: Diagnosis not present

## 2021-01-04 DIAGNOSIS — N5201 Erectile dysfunction due to arterial insufficiency: Secondary | ICD-10-CM | POA: Diagnosis not present

## 2021-01-04 DIAGNOSIS — R350 Frequency of micturition: Secondary | ICD-10-CM | POA: Diagnosis not present

## 2021-01-04 NOTE — Telephone Encounter (Signed)
I called and spoke with Cassie at [facility]. The patient made a 2nd dropoff on 12/30/2020 but did not sign the form giving [facility] access to read it and [employer] was supposed to fax results to [place]. Checked with Dr. Annamaria Boots and he has not seen it so [facility] will have [employer] refax it. Gave them [facility] fax number so it goes to her. Will forward to [employer] as FYI.

## 2021-01-06 NOTE — Telephone Encounter (Signed)
Called and spoke with Sima Matas from [employer] at [phone]  who states that the date was wrong on the study and so it was never released to be faxed to our office. She has fixed what was needed and I have provided her with [facility] fax number to send report. Will forward to [place] as [employer]

## 2021-01-06 NOTE — Telephone Encounter (Signed)
I have not received the ONO results.

## 2021-01-06 NOTE — Telephone Encounter (Signed)
Please advise if the results have been received yet from [facility].

## 2021-01-09 NOTE — Telephone Encounter (Signed)
I still haven't gotten the ONO for this patient.

## 2021-01-09 NOTE — Telephone Encounter (Signed)
Called [employer] and spoke with Gabriel Cirri letting her know that we still had not received pt's ONO results. She stated that she would fax it to our office.  I have provided Gabriel Cirri with the fax number to the triage fax machine since it's right where I am. Will update once fax has been received.

## 2021-01-09 NOTE — Telephone Encounter (Signed)
We finally have the results of the overnight oxygen test that we had been waiting for.  This shows that oxygen level was very low through most of the night. He definitely needs to continue oxygen 2L/ min while sleeping, to protecct his heart and brain.

## 2021-01-09 NOTE — Telephone Encounter (Signed)
Called and spoke with pt letting him know the results of the ONO and stated to him that he needs to continue to wear 2L O2 at night while sleeping. Pt verbalized understanding. Nothing further needed.

## 2021-01-09 NOTE — Telephone Encounter (Signed)
ONO results have been received and have been given to Dr. Annamaria Boots. Routing the encounter to him.

## 2021-01-12 ENCOUNTER — Encounter: Payer: Self-pay | Admitting: Internal Medicine

## 2021-01-21 ENCOUNTER — Encounter: Payer: Self-pay | Admitting: Internal Medicine

## 2021-01-21 NOTE — Assessment & Plan Note (Signed)
We need result of ONOX and are contacting [facility]. Discussed indications for sleep O2. He says he is sleeping well without it.

## 2021-01-21 NOTE — Assessment & Plan Note (Signed)
Very stable without an active bronchitis component Plan - ok to remain off inhalers if comfortable

## 2021-02-10 ENCOUNTER — Telehealth: Payer: Self-pay | Admitting: Internal Medicine

## 2021-02-10 NOTE — Telephone Encounter (Signed)
Form placed on [employer].

## 2021-02-10 NOTE — Telephone Encounter (Signed)
Patient dropped off forms to be filled out by [facility] Patient states that he has an appt on tues 5.31.22 and could pick them up then if we can get them done in time Otherwise call him when they are ready  Placed into paz folder up front

## 2021-02-14 ENCOUNTER — Other Ambulatory Visit: Payer: Self-pay

## 2021-02-14 ENCOUNTER — Ambulatory Visit (INDEPENDENT_AMBULATORY_CARE_PROVIDER_SITE_OTHER): Payer: Medicare Other | Admitting: Internal Medicine

## 2021-02-14 VITALS — BP 105/69 | HR 76 | Temp 97.1°F | Ht 74.0 in | Wt 231.8 lb

## 2021-02-14 DIAGNOSIS — R634 Abnormal weight loss: Secondary | ICD-10-CM

## 2021-02-14 DIAGNOSIS — E1140 Type 2 diabetes mellitus with diabetic neuropathy, unspecified: Secondary | ICD-10-CM

## 2021-02-14 DIAGNOSIS — R269 Unspecified abnormalities of gait and mobility: Secondary | ICD-10-CM

## 2021-02-14 DIAGNOSIS — E782 Mixed hyperlipidemia: Secondary | ICD-10-CM

## 2021-02-14 LAB — ALT: ALT: 21 U/L (ref 0–53)

## 2021-02-14 LAB — LIPID PANEL
Cholesterol: 141 mg/dL (ref 0–200)
HDL: 38.3 mg/dL — ABNORMAL LOW (ref >39.00)
LDL Cholesterol: 64 mg/dL (ref 0–99)
NonHDL: 102.92
Total CHOL/HDL Ratio: 4
Triglycerides: 195 mg/dL — ABNORMAL HIGH (ref 0.0–149.0)
VLDL: 39 mg/dL (ref 0.0–40.0)

## 2021-02-14 LAB — AST: AST: 15 U/L (ref 0–37)

## 2021-02-14 NOTE — Patient Instructions (Signed)
Check the  blood pressure  BP GOAL is between 110/65 and  135/85. If it is consistently higher or lower, let me know     GO TO THE LAB : Get the blood work     [place], [place] back for  a check up in 6 months

## 2021-02-14 NOTE — Assessment & Plan Note (Signed)
DM, hypogonadism: Per Endo Weight loss: Gradual weight loss over the time, he feels well, he is just concerned about losing weight.  I pointed out his BMI is okay and he looks healthy, he however does not like to keep losing weight.  Recommend to discuss with Endo, weight loss probably related to Jardiance, Trulicity. Neuropathy: Started gabapentin, no help.  We talked about increasing the dose and he declined thus  will stop gabapentin. High cholesterol: On simva, FLP, AST, ALT Gait disorder: Balance is a still not the best, denies any falls, declines to use a cane for now Patient care coordination: Extensive paperwork completed for the patient.  Needs paperwork for the [place]. RTC 6 months

## 2021-02-14 NOTE — Progress Notes (Signed)
Subjective:    Patient ID: [name], male    DOB: May 25, 1936, 85 y.o.   MRN: [id number]  DOS:  02/14/2021 Type of visit - description: ROV Today with talk about high blood pressure, weight loss, neuropathy. He is also concerned about his wife's health. BP noted to be somewhat low, at home BPs are normal in the 120s. Denies chest pain or difficulty breathing No weakness or dizziness    Wt Readings from Last 3 Encounters:  02/14/21 231 lb 12.8 oz (105.1 kg)  11/23/20 235 lb (106.6 kg)  10/31/20 234 lb 9.6 oz (106.4 kg)     Review of Systems See above   Past Medical History:  Diagnosis Date  . CAD (coronary artery disease)    Two RCA stents remotely / 3rd RCA stent 2006  . Colon polyps    s/p several Cscopes.  Marland Kitchen COPD (chronic obstructive pulmonary disease) (HCC)    on O2, nocturnal  . Diabetes mellitus    dx aprox 2009  . ED (erectile dysfunction)    has a vacumm device  . Ejection fraction   . Hyperlipidemia    dx in 90s  . Hypertension    dx in the 90  . Hypogonadism male   . PVD (peripheral vascular disease) ([place])    s/p stents at LE 2009, Dr Einar Gip  . Shortness of breath    O2 Sat dropped to 82% walking on the treadmill, September, 2012    Past Surgical History:  Procedure Laterality Date  . APPENDECTOMY    . TONSILLECTOMY      Allergies as of 02/14/2021   No Known Allergies     Medication List       Accurate as of Feb 14, 2021  8:42 PM. If you have any questions, ask your nurse or doctor.        STOP taking these medications   gabapentin 300 MG capsule Commonly known as: NEURONTIN Stopped by: Kathlene November, MD     TAKE these medications   albuterol 108 (90 Base) MCG/ACT inhaler Commonly known as: VENTOLIN HFA Inhale 2 puffs into the lungs every 6 (six) hours as needed for wheezing or shortness of breath.   amLODipine 5 MG tablet Commonly known as: NORVASC Take 1 tablet (5 mg total) by mouth daily.   aspirin 81 MG tablet Take 81 mg  by mouth daily.   empagliflozin 10 MG Tabs tablet Commonly known as: Jardiance Take 1 tablet (10 mg total) by mouth daily with breakfast.   FreeStyle Freedom Lite w/Device Kit Use Freestyle Freedom Lite meter to check blood sugar twice daily. DX:E11.65   freestyle lancets USE AS INSTRUCTED TO CHECK BLOOD SUGAR TWICE A DAY   FREESTYLE LITE test strip Generic drug: glucose blood USE TO CHECK BLOOD SUGAR TWICE A DAY AS INSTRUCTED   losartan 100 MG tablet Commonly known as: COZAAR Take 0.5 tablets (50 mg total) by mouth daily.   metFORMIN 1000 MG tablet Commonly known as: GLUCOPHAGE Take 500 mg by mouth daily with breakfast.   metoprolol succinate 100 MG 24 hr tablet Commonly known as: Toprol XL Take 1 tablet (100 mg total) by mouth daily.   multivitamin with minerals Tabs tablet Take 1 tablet by mouth daily.   simvastatin 20 MG tablet Commonly known as: ZOCOR Take 20 mg by mouth at bedtime.   Stiolto Respimat 2.5-2.5 MCG/ACT Aers Generic drug: Tiotropium Bromide-Olodaterol Inhale 2 puffs into the lungs daily as needed (for flares).  triamterene-hydrochlorothiazide 37.5-25 MG tablet Commonly known as: MAXZIDE-25 Take 1 tablet by mouth daily.   Trulicity 3 DZ/3.2DJ Sopn Generic drug: Dulaglutide INJECT 3 MG UNDER THE SKIN ONCE A WEEK          Objective:   Physical Exam BP 105/69 (BP Location: Left Arm, Patient Position: Sitting, Cuff Size: Large)   Pulse 76   Temp (!) 97.1 F (36.2 C) (Temporal)   Ht '6\' 2"'  (1.88 m)   Wt 231 lb 12.8 oz (105.1 kg)   SpO2 96%   BMI 29.76 kg/m  General:   Well developed, NAD, BMI noted. HEENT:  Normocephalic . Face symmetric, atraumatic Lungs:  CTA B Normal respiratory effort, no intercostal retractions, no accessory muscle use. Heart: RRR,  no murmur.  Lower extremities: no pretibial edema bilaterally  Skin: Not pale. Not jaundice Neurologic:  alert & oriented X3.  Speech normal, gait appropriate for age and  unassisted Psych--  Cognition and judgment appear intact.  Cooperative with normal attention span and concentration.  Behavior appropriate. No anxious or depressed appearing.      Assessment     Assessment DM dx ~2426, complicated by CAD, PVD, mild neuropathy. Sees Dr. Dwyane Dee Hypogonadism. Sees Dr. Dwyane Dee HTN Hyperlipidemia COPD, nocturnal O2 prn Back pain, chronic: See visit 10/12/2016 CV: -CAD, Dr. Ron Parker Dr Meda Coffee S/p  stenting 2 to the RCA remote past and the third RCA stent in  2006, negative stress test in 2012, LVEF 55% on echo 2015 -Peripheral vascular disease, stents lower extremity 2009  ED Polycythemia: Saw hematology 11-2018, felt to be due to chronic respiratory failure.  Not likely polycythemia vera  PLAN    DM, hypogonadism: Per Endo Weight loss: Gradual weight loss over the time, he feels well, he is just concerned about losing weight.  I pointed out his BMI is okay and he looks healthy, he however does not like to keep losing weight.  Recommend to discuss with Endo, weight loss probably related to Jardiance, Trulicity. Neuropathy: Started gabapentin, no help.  We talked about increasing the dose and he declined thus  will stop gabapentin. High cholesterol: On simva, FLP, AST, ALT Gait disorder: Balance is a still not the best, denies any falls, declines to use a cane for now Patient care coordination: Extensive paperwork completed for the patient.  Needs paperwork for the [place]. RTC 6 months     This visit occurred during the SARS-CoV-2 public health emergency.  Safety protocols were in place, including screening questions prior to the visit, additional usage of staff PPE, and extensive cleaning of exam room while observing appropriate contact time as indicated for disinfecting solutions.

## 2021-02-14 NOTE — Telephone Encounter (Signed)
Pt was seen on 02/14/2021. Dr. Larose Kells will fillout form and we will advise patient when completed. -JMA

## 2021-02-15 NOTE — Telephone Encounter (Signed)
Form completed, sent for scanning. Pt aware that form is ready for pick up.

## 2021-03-10 ENCOUNTER — Ambulatory Visit (INDEPENDENT_AMBULATORY_CARE_PROVIDER_SITE_OTHER): Payer: Medicare Other | Admitting: Primary Care

## 2021-03-10 ENCOUNTER — Encounter: Payer: Self-pay | Admitting: Primary Care

## 2021-03-10 ENCOUNTER — Other Ambulatory Visit: Payer: Self-pay

## 2021-03-10 VITALS — BP 112/60 | HR 87 | Temp 97.8°F | Ht 74.0 in | Wt 234.6 lb

## 2021-03-10 DIAGNOSIS — J449 Chronic obstructive pulmonary disease, unspecified: Secondary | ICD-10-CM

## 2021-03-10 DIAGNOSIS — J9611 Chronic respiratory failure with hypoxia: Secondary | ICD-10-CM | POA: Diagnosis not present

## 2021-03-10 NOTE — Progress Notes (Signed)
_0  ID: Jonathan Leather., male    DOB: 04/27/1936, 85 y.o.   MRN: [id number]  No chief complaint on file.   Referring provider: Colon Branch, MD  HPI: 85 year old male, former smoker quit 1978 (60-pack-year history).  Past medical history significant for chronic respiratory failure with hypoxia, COPD mixed type, hypertension, coronary artery disease, type 2 diabetes. Patient of Dr. Annamaria Boots, last seen in office on 10/31/2020. Patient is maintained on Stiolto.   03/10/2021 Patient presents today to discuss POC. He is doing well otherwise, he has no acute respiratory complaints. He is interested in Orthoptist. He uses oxygen as needed during the day at times. He seldomly wears it at night. He had an overnight oximetry back in April 2022 that showed low oxygen levels at night and it was recommended that he wear 2L at night.    No Known Allergies  Immunization History  Administered Date(s) Administered   Fluad Quad(high Dose 65+) 05/07/2019, 06/06/2020   Influenza Split 06/18/2012, 06/17/2020   Influenza Whole 05/02/2010, 06/18/2011   Influenza, High Dose Seasonal PF 05/18/2013, 06/17/2014, 07/19/2015, 06/19/2016, 07/18/2016, 06/05/2017, 07/16/2017, 06/06/2018, 07/22/2018   Influenza,inj,Quad PF,6+ Mos 06/04/2013, 06/10/2014, 07/06/2015   Influenza-Unspecified 07/31/2000, 06/17/2012   Moderna Sars-Covid-2 Vaccination 10/13/2019, 11/10/2019, 07/18/2020, 12/16/2020   Pneumococcal Conjugate-13 03/17/2013, 01/18/2014   Pneumococcal Polysaccharide-23 06/21/2010, 10/17/2018   Pneumococcal-Unspecified 05/19/2011   Td 09/17/2004, 12/16/2013   Tdap 05/18/2005, 11/18/2014   Tetanus 12/16/2013   Zoster Recombinat (Shingrix) 10/21/2018, 04/06/2019    Past Medical History:  Diagnosis Date   CAD (coronary artery disease)    Two RCA stents remotely / 3rd RCA stent 2006   Colon polyps    s/p several Cscopes.   COPD (chronic obstructive pulmonary disease) (HCC)    on O2,  nocturnal   Diabetes mellitus    dx aprox 2009   ED (erectile dysfunction)    has a vacumm device   Ejection fraction    Hyperlipidemia    dx in 90s   Hypertension    dx in the 63   Hypogonadism male    PVD (peripheral vascular disease) ([place])    s/p stents at LE 2009, Dr Einar Gip   Shortness of breath    O2 Sat dropped to 82% walking on the treadmill, September, 2012    Tobacco History: Social History   Tobacco Use  Smoking Status Former   Packs/day: 2.00   Years: 30.00   Pack years: 60.00   Types: Cigarettes   Quit date: 06/17/1977   Years since quitting: 43.7  Smokeless Tobacco Never  Tobacco Comments   2 ppd, quit 1987   Counseling given: Not Answered Tobacco comments: 2 ppd, quit 1987   Outpatient Medications Prior to Visit  Medication Sig Dispense Refill   albuterol (VENTOLIN HFA) 108 (90 Base) MCG/ACT inhaler Inhale 2 puffs into the lungs every 6 (six) hours as needed for wheezing or shortness of breath.     amLODipine (NORVASC) 5 MG tablet Take 1 tablet (5 mg total) by mouth daily. 90 tablet 3   aspirin 81 MG tablet Take 81 mg by mouth daily.     Blood Glucose Monitoring Suppl (FREESTYLE FREEDOM LITE) w/Device KIT Use Freestyle Freedom Lite meter to check blood sugar twice daily. DX:E11.65 1 kit 0   empagliflozin (JARDIANCE) 10 MG TABS tablet Take 1 tablet (10 mg total) by mouth daily with breakfast. 90 tablet 3   FREESTYLE LITE test strip USE TO CHECK BLOOD SUGAR TWICE A DAY  AS INSTRUCTED 100 each 6   Lancets (FREESTYLE) lancets USE AS INSTRUCTED TO CHECK BLOOD SUGAR TWICE A DAY 200 each 1   losartan (COZAAR) 100 MG tablet Take 0.5 tablets (50 mg total) by mouth daily.     metFORMIN (GLUCOPHAGE) 1000 MG tablet Take 500 mg by mouth daily with breakfast.      metoprolol succinate (TOPROL XL) 100 MG 24 hr tablet Take 1 tablet (100 mg total) by mouth daily. 90 tablet 2   Multiple Vitamin (MULTIVITAMIN WITH MINERALS) TABS tablet Take 1 tablet by mouth daily.      simvastatin (ZOCOR) 20 MG tablet Take 20 mg by mouth at bedtime.      Tiotropium Bromide-Olodaterol (STIOLTO RESPIMAT) 2.5-2.5 MCG/ACT AERS Inhale 2 puffs into the lungs daily as needed (for flares).     triamterene-hydrochlorothiazide (MAXZIDE-25) 37.5-25 MG tablet Take 1 tablet by mouth daily.      TRULICITY 3 PP/5.0DT SOPN INJECT 3 MG UNDER THE SKIN ONCE A WEEK 6 mL 3   No facility-administered medications prior to visit.    Review of Systems  Review of Systems  Constitutional: Negative.   HENT: Negative.    Respiratory: Negative.    Cardiovascular: Negative.     Physical Exam  BP 112/60 (BP Location: Left Arm, Patient Position: Sitting, Cuff Size: Normal)   Pulse 87   Temp 97.8 F (36.6 C) (Oral)   Ht _0  (1.88 m)   Wt 234 lb 9.6 oz (106.4 kg)   SpO2 97%   BMI 30.12 kg/m  Physical Exam Constitutional:      Appearance: Normal appearance.  HENT:     Head: Normocephalic and atraumatic.     Mouth/Throat:     Comments: Deferred d/t masking Cardiovascular:     Rate and Rhythm: Normal rate and regular rhythm.  Pulmonary:     Effort: Pulmonary effort is normal.     Breath sounds: Normal breath sounds.     Comments: CTA Musculoskeletal:        General: Normal range of motion.  Skin:    General: Skin is warm and dry.  Neurological:     General: No focal deficit present.     Mental Status: He is alert and oriented to person, place, and time. Mental status is at baseline.  Psychiatric:        Mood and Affect: Mood normal.        Behavior: Behavior normal.        Thought Content: Thought content normal.        Judgment: Judgment normal.     Lab Results:  CBC    Component Value Date/Time   WBC 8.0 11/15/2020 1050   RBC 5.17 11/15/2020 1050   HGB 17.2 (H) 11/15/2020 1050   HGB 17.8 (H) 11/19/2019 0929   HCT 50.4 11/15/2020 1050   PLT 201.0 11/15/2020 1050   PLT 220 11/19/2019 0929   MCV 97.5 11/15/2020 1050   MCH 32.7 11/19/2019 0929   MCHC 34.1 11/15/2020  1050   RDW 15.1 11/15/2020 1050   LYMPHSABS 2.9 02/08/2020 1440   MONOABS 0.9 02/08/2020 1440   EOSABS 0.3 02/08/2020 1440   BASOSABS 0.0 02/08/2020 1440    BMET    Component Value Date/Time   NA 139 11/15/2020 1050   K 3.9 11/15/2020 1050   CL 105 11/15/2020 1050   CO2 27 11/15/2020 1050   GLUCOSE 102 (H) 11/15/2020 1050   BUN 20 11/15/2020 1050   CREATININE 1.46 11/15/2020 1050  CREATININE 1.56 (H) 11/19/2019 0929   CALCIUM 9.7 11/15/2020 1050   GFRNONAA 41 (L) 11/19/2019 0929   GFRAA 48 (L) 11/19/2019 0929    BNP    Component Value Date/Time   BNP 17.6 03/05/2019 1700    ProBNP No results found for: PROBNP  Imaging: No results found.   Assessment & Plan:   Chronic respiratory failure with hypoxia (HCC) - Patient is interested in portable oxygen concentrator, he desaturated to 88% with ambulatory walk today and qualified for POC. - He needs to continue to wear 2L oxygen at night and with moderate exertion.  - We have placed DME order for POC with [employer], if they are unable to provide portable concentrator he will look into getting [employer]   COPD mixed type ([place]) - Stable, no acute respiratory complaints or signs of exacerbation. No changes to medication regimen. Continue Stiolto respimat 2 puff once daily.    Martyn Ehrich, NP 03/10/2021

## 2021-03-10 NOTE — Patient Instructions (Addendum)
Nice seeing you today Jonathan [name]  Chronic respiratory failure: You need to wear 2L oxygen with moderate exertion and at night.  If DME company is unable to provide you with portable concentrator recommend you look into purchasing through [employer], they have rental options I believe. They can fax [place] an order form for [place] to complete ([phone] attn: Geraldo Pitter, NP)  Follow-up: 3 months with Dr. Annamaria Boots

## 2021-03-10 NOTE — Assessment & Plan Note (Signed)
-   Stable, no acute respiratory complaints or signs of exacerbation. No changes to medication regimen. Continue Stiolto respimat 2 puff once daily.

## 2021-03-10 NOTE — Assessment & Plan Note (Addendum)
-   Patient is interested in portable oxygen concentrator, he desaturated to 88% with ambulatory walk today and qualified for POC. - He needs to continue to wear 2L oxygen at night and with moderate exertion.  - We have placed DME order for POC with [employer], if they are unable to provide portable concentrator he will look into getting [employer]

## 2021-03-28 ENCOUNTER — Other Ambulatory Visit (INDEPENDENT_AMBULATORY_CARE_PROVIDER_SITE_OTHER): Payer: Medicare Other

## 2021-03-28 ENCOUNTER — Other Ambulatory Visit: Payer: Self-pay

## 2021-03-28 DIAGNOSIS — E1165 Type 2 diabetes mellitus with hyperglycemia: Secondary | ICD-10-CM | POA: Diagnosis not present

## 2021-03-28 LAB — BASIC METABOLIC PANEL
BUN: 22 mg/dL (ref 6–23)
CO2: 25 mEq/L (ref 19–32)
Calcium: 9.6 mg/dL (ref 8.4–10.5)
Chloride: 102 mEq/L (ref 96–112)
Creatinine, Ser: 1.68 mg/dL — ABNORMAL HIGH (ref 0.40–1.50)
GFR: 37.46 mL/min — ABNORMAL LOW (ref >60.00)
Glucose, Bld: 161 mg/dL — ABNORMAL HIGH (ref 70–99)
Potassium: 3.9 mEq/L (ref 3.5–5.1)
Sodium: 137 mEq/L (ref 135–145)

## 2021-03-28 LAB — HEMOGLOBIN A1C: Hgb A1c MFr Bld: 7.4 % — ABNORMAL HIGH (ref 4.6–6.5)

## 2021-03-30 ENCOUNTER — Encounter: Payer: Self-pay | Admitting: Endocrinology

## 2021-03-30 ENCOUNTER — Ambulatory Visit (INDEPENDENT_AMBULATORY_CARE_PROVIDER_SITE_OTHER): Payer: Medicare Other | Admitting: Endocrinology

## 2021-03-30 ENCOUNTER — Other Ambulatory Visit: Payer: Self-pay

## 2021-03-30 VITALS — BP 118/72 | HR 73 | Ht 74.0 in | Wt 233.0 lb

## 2021-03-30 DIAGNOSIS — N289 Disorder of kidney and ureter, unspecified: Secondary | ICD-10-CM

## 2021-03-30 DIAGNOSIS — E782 Mixed hyperlipidemia: Secondary | ICD-10-CM

## 2021-03-30 DIAGNOSIS — I10 Essential (primary) hypertension: Secondary | ICD-10-CM

## 2021-03-30 DIAGNOSIS — E1165 Type 2 diabetes mellitus with hyperglycemia: Secondary | ICD-10-CM

## 2021-03-30 NOTE — Progress Notes (Signed)
Patient ID: Jonathan Leather., male   DOB: 09-07-1936, 85 y.o.   MRN: [id number]    Reason for Appointment:  Follow-up  History of Present Illness:          Diagnosis: Type 2 diabetes mellitus, date of diagnosis:  2009      Past history: He is not clear when his diabetes was diagnosed but according to hospital records it may have been in 2009. Most likely he was placed on metformin initially and at some point Amaryl added. In 2013 it was also given Januvia presumably to improve his control. However appears that his A1c has been consistently over 7% Janumet was started instead of metformin and Januvia separately in 2013  He was started on Trulicity in 7/06 instead of Victoza because excessive bruising on his abdomen  Later in 01/2015 he was switched to Bydureon because of insurance noncoverage of his Trulicity  Recent history:   Non-insulin hypoglycemic drugs are: Metformin 237 2x a day, Trulicity 3.0  mg weekly, Jardiance 12.5 mg daily  His A1c is about the same at 7.4, previously 7.1  Fructosamine previously 288  Current blood sugar patterns and problems: His blood sugars are somewhat difficult to assess because recently appears to have his meter time was about 12 hours fast. Not clear why his A1c is relatively high since his blood sugars are consistently below 160 at home with some readings in the evenings also after dinner He also thinks he is active with either walking, yard work or strength training Is doing fairly well with trying to plan his meals as required and not eating out as much Weight is about the same Also 2-3 times a week he is able to get on a treadmill for exercise No side effects with Trulicity 3 mg dose He is still getting Jardiance from [facility] and doing half of the 25 mg     Side effects from medications have been: None  Glucose monitoring:  done usually once a day       Glucometer:  FreeStyle  Blood Glucose readings      PRE-MEAL Fasting Lunch Dinner Bedtime Overall  Glucose range: 115-159      Mean/median:     130   POST-MEAL PC Breakfast PC Lunch PC Dinner  Glucose range:  1 19-139 135, 153  Mean/median:      Previously:  PRE-MEAL  morning Lunch Dinner Bedtime Overall  Glucose range:  112-173  146  131    Mean/median:     136   POST-MEAL PC Breakfast PC Lunch PC Dinner  Glucose range:    188  Mean/median:          Meals: 3 meals per day. eating egg/meat bread for breakfast, dinner 6-7 PM     Dietician visit: Most recent: 4/16 .               Weight history:  Wt Readings from Last 3 Encounters:  03/30/21 233 lb (105.7 kg)  03/10/21 234 lb 9.6 oz (106.4 kg)  02/14/21 231 lb 12.8 oz (105.1 kg)   Glycemic control:   Lab Results  Component Value Date   HGBA1C 7.4 (H) 03/28/2021   HGBA1C 7.1 (H) 11/15/2020   HGBA1C 7.3 (H) 08/22/2020   Lab Results  Component Value Date   MICROALBUR <0.7 11/15/2020   LDLCALC 64 02/14/2021   CREATININE 1.68 (H) 03/28/2021    Lab Results  Component Value Date   FRUCTOSAMINE 288 (H)  05/24/2020   FRUCTOSAMINE 284 08/26/2019   FRUCTOSAMINE 284 03/29/2017    OTHER active problems addressed today: See review of systems  Lab on 03/28/2021  Component Date Value Ref Range Status   Sodium 03/28/2021 137  135 - 145 mEq/L Final   Potassium 03/28/2021 3.9  3.5 - 5.1 mEq/L Final   Chloride 03/28/2021 102  96 - 112 mEq/L Final   CO2 03/28/2021 25  19 - 32 mEq/L Final   Glucose, Bld 03/28/2021 161 (A) 70 - 99 mg/dL Final   BUN 03/28/2021 22  6 - 23 mg/dL Final   Creatinine, Ser 03/28/2021 1.68 (A) 0.40 - 1.50 mg/dL Final   GFR 03/28/2021 37.46 (A) >60.00 mL/min Final   Calculated using the CKD-EPI Creatinine Equation (2021)   Calcium 03/28/2021 9.6  8.4 - 10.5 mg/dL Final   Hgb A1c MFr Bld 03/28/2021 7.4 (A) 4.6 - 6.5 % Final   Glycemic Control Guidelines for People with Diabetes:Non Diabetic:  <6%Goal of Therapy: <7%Additional Action Suggested:   >8%      Allergies as of 03/30/2021   No Known Allergies      Medication List        Accurate as of March 30, 2021  9:01 AM. If you have any questions, ask your nurse or doctor.          albuterol 108 (90 Base) MCG/ACT inhaler Commonly known as: VENTOLIN HFA Inhale 2 puffs into the lungs every 6 (six) hours as needed for wheezing or shortness of breath.   amLODipine 5 MG tablet Commonly known as: NORVASC Take 1 tablet (5 mg total) by mouth daily.   aspirin 81 MG tablet Take 81 mg by mouth daily.   empagliflozin 10 MG Tabs tablet Commonly known as: Jardiance Take 1 tablet (10 mg total) by mouth daily with breakfast.   FreeStyle Freedom Lite w/Device Kit Use Freestyle Freedom Lite meter to check blood sugar twice daily. DX:E11.65   freestyle lancets USE AS INSTRUCTED TO CHECK BLOOD SUGAR TWICE A DAY   FREESTYLE LITE test strip Generic drug: glucose blood USE TO CHECK BLOOD SUGAR TWICE A DAY AS INSTRUCTED   losartan 100 MG tablet Commonly known as: COZAAR Take 0.5 tablets (50 mg total) by mouth daily.   metFORMIN 1000 MG tablet Commonly known as: GLUCOPHAGE Take 500 mg by mouth daily with breakfast.   metoprolol succinate 100 MG 24 hr tablet Commonly known as: Toprol XL Take 1 tablet (100 mg total) by mouth daily.   multivitamin with minerals Tabs tablet Take 1 tablet by mouth daily.   simvastatin 20 MG tablet Commonly known as: ZOCOR Take 20 mg by mouth at bedtime.   Stiolto Respimat 2.5-2.5 MCG/ACT Aers Generic drug: Tiotropium Bromide-Olodaterol Inhale 2 puffs into the lungs daily as needed (for flares).   triamterene-hydrochlorothiazide 37.5-25 MG tablet Commonly known as: MAXZIDE-25 Take 1 tablet by mouth daily.   Trulicity 3 PZ/0.2HE Sopn Generic drug: Dulaglutide INJECT 3 MG UNDER THE SKIN ONCE A WEEK        Allergies: No Known Allergies  Past Medical History:  Diagnosis Date   CAD (coronary artery disease)    Two RCA stents  remotely / 3rd RCA stent 2006   Colon polyps    s/p several Cscopes.   COPD (chronic obstructive pulmonary disease) (HCC)    on O2, nocturnal   Diabetes mellitus    dx aprox 2009   ED (erectile dysfunction)    has a vacumm device   Ejection fraction  Hyperlipidemia    dx in 90s   Hypertension    dx in the 38   Hypogonadism male    PVD (peripheral vascular disease) ([place])    s/p stents at [facility] 2009, Dr Einar Gip   Shortness of breath    O2 Sat dropped to 82% walking on the treadmill, September, 2012    Past Surgical History:  Procedure Laterality Date   APPENDECTOMY     TONSILLECTOMY      Family History  Problem Relation Age of Onset   Heart disease Father    Hypertension Father    Stroke Father    Diabetes Paternal Aunt    Diabetes Maternal Grandmother    Diabetes Other        GM, nephews, many family members   Hyperlipidemia Other        ?   Prostate cancer Brother    Colon cancer Neg Hx     Social History:  reports that he quit smoking about 43 years ago. His smoking use included cigarettes. He has a 60.00 pack-year smoking history. He has never used smokeless tobacco. He reports current alcohol use. He reports that he does not use drugs.    Review of Systems        Lipids: He has been on treatment with simvastatin 20 mg from his PCP below with good control as follows       Lab Results  Component Value Date   CHOL 141 02/14/2021   CHOL 117 02/24/2020   CHOL 118 06/30/2019   Lab Results  Component Value Date   HDL 38.30 (L) 02/14/2021   HDL 37.00 (L) 02/24/2020   HDL 37.40 (L) 06/30/2019   Lab Results  Component Value Date   LDLCALC 64 02/14/2021   LDLCALC 63 02/24/2020   Kiron 62 06/30/2019   Lab Results  Component Value Date   TRIG 195.0 (H) 02/14/2021   TRIG 87.0 02/24/2020   TRIG 94.0 06/30/2019   Lab Results  Component Value Date   CHOLHDL 4 02/14/2021   CHOLHDL 3 02/24/2020   CHOLHDL 3 06/30/2019   Lab Results  Component Value Date    LDLDIRECT 55.0 01/01/2018                    HYPOGONADISM He had decreased libido and erectile dysfunction previously and was found to have hypogonadism, probably in 2011. He did have a slightly low free testosterone level in 2012 on treatment Prolactin level normal  Previously on AndroGel With his hemoglobin going up to 19.1 in August 2019 he has been told not to take any testosterone He was given clomiphene as a trial but he did not pick up the prescription for unknown reasons  Testosterone level is persistently low without testosterone therapy  Again not complaining of any unusual fatigue Testosterone level has not been followed   Lab Results  Component Value Date   TESTOSTERONE 215.23 (L) 05/24/2020   TESTOSTERONE 257.22 (L) 03/10/2020   TESTOSTERONE 210.21 (L) 06/30/2019    He had been evaluated by hematologist for his erythrocytosis Hemoglobin has been high normal or slightly high   CBC Latest Ref Rng & Units 11/15/2020 05/24/2020 02/08/2020  WBC 4.0 - 10.5 K/uL 8.0 6.3 7.8  Hemoglobin 13.0 - 17.0 g/dL 17.2(H) 17.7(H) 16.2  Hematocrit 39.0 - 52.0 % 50.4 52.6(H) 48.3  Platelets 150.0 - 400.0 K/uL 201.0 190.0 193.0        HYPERTENSION: This is followed by PCP and is on 50  mg of losartan along with half Maxide, amlodipine 5 mg and metoprolol   He is checking at home periodically  BP at home usually in the 120s  BP Readings from Last 3 Encounters:  03/30/21 118/72  03/10/21 112/60  02/14/21 105/69   RENAL dysfunction: His creatinine is relatively high and not clear why it is higher now  Lab Results  Component Value Date   CREATININE 1.68 (H) 03/28/2021   CREATININE 1.46 11/15/2020   CREATININE 1.44 08/22/2020   NEUROPATHY: He has had persistent sensory loss in his distal feet No discomfort currently  LABS:  Lab on 03/28/2021  Component Date Value Ref Range Status   Sodium 03/28/2021 137  135 - 145 mEq/L Final   Potassium 03/28/2021 3.9  3.5 - 5.1 mEq/L  Final   Chloride 03/28/2021 102  96 - 112 mEq/L Final   CO2 03/28/2021 25  19 - 32 mEq/L Final   Glucose, Bld 03/28/2021 161 (A) 70 - 99 mg/dL Final   BUN 03/28/2021 22  6 - 23 mg/dL Final   Creatinine, Ser 03/28/2021 1.68 (A) 0.40 - 1.50 mg/dL Final   GFR 03/28/2021 37.46 (A) >60.00 mL/min Final   Calculated using the CKD-EPI Creatinine Equation (2021)   Calcium 03/28/2021 9.6  8.4 - 10.5 mg/dL Final   Hgb A1c MFr Bld 03/28/2021 7.4 (A) 4.6 - 6.5 % Final   Glycemic Control Guidelines for People with Diabetes:Non Diabetic:  <6%Goal of Therapy: <7%Additional Action Suggested:  >8%     Physical Examination:  BP 118/72   Pulse 73   Ht '6\' 2"'  (1.88 m)   Wt 233 lb (105.7 kg)   SpO2 98%   BMI 29.92 kg/m       ASSESSMENT/PLAN:   Diabetes type 2, with mild obesity, non-insulin-dependent  His A1c is stable at 7.4  Currently on 3 mg Trulicity, 10 mg Jardiance and Metformin  Considering his age and comorbid conditions his blood sugar control is excellent At home his blood sugars are in a good range also Although he has not lost any weight he is generally watching his diet and exercising  His meter was set to the correct date and time Reminded him to check blood sugars after meals are daily with fasting readings and discussed blood sugar targets   HYPERTENSION: Blood pressure is relatively low and he is on multiple medications, unclear what doses he is taking  RENAL dysfunction: This may be related to decreased renal perfusion and will need to check what dose of triamterene HCT he is taking, he will call us back  Mild erythrocytosis: He will need to follow-up with PCP regularly   There are no Patient Instructions on file for this visit.    Elayne Snare 03/30/2021, 9:01 AM   Note: This office note was prepared with Dragon voice recognition system technology. Any transcriptional errors that result from this process are unintentional.

## 2021-03-30 NOTE — Patient Instructions (Signed)
Check blood sugars on waking up 3 days a week  Also check blood sugars about 2 hours after meals and do this after different meals by rotation  Recommended blood sugar levels on waking up are 90-130 and about 2 hours after meal is 130-160  Please bring your blood sugar monitor to each visit, thank you  Confirm BP Rxs

## 2021-03-31 ENCOUNTER — Telehealth: Payer: Self-pay | Admitting: Endocrinology

## 2021-03-31 NOTE — Telephone Encounter (Signed)
Pt called bc Dr told him to send a mychart message but he couldn't figure out how. He wanted to report to the Dr he was taking a whole tablet of his Triamterene but at the Drs request yesterday he is now breaking it and taking half.

## 2021-03-31 NOTE — Telephone Encounter (Signed)
Pt called to report he is taking 100 mg of losartan

## 2021-04-06 ENCOUNTER — Telehealth: Payer: Self-pay | Admitting: Internal Medicine

## 2021-04-06 NOTE — Telephone Encounter (Signed)
Will call [facility] in the morning to f/u as they close at [place] like order for POC was sent in on 03/10/21 per BW so that might be what it is about.

## 2021-04-07 NOTE — Telephone Encounter (Signed)
Patient called in regards to phone call. I spoke with patient regarding a call from [facility], asking if we needed anything. It looks like the patient was interested in getting an [employer] POC machine and a order was sent in to [facility]. Patient stated he could not afford the machine so he was pursing this. I informed patient to call if anything changes. Nothing further needed

## 2021-04-07 NOTE — Telephone Encounter (Signed)
Called patient directly to see what was needed with his POC and to confirm that he has one that is working but he did not answer. Left message for him to call back.

## 2021-05-23 ENCOUNTER — Telehealth: Payer: Self-pay | Admitting: Internal Medicine

## 2021-05-23 DIAGNOSIS — I779 Disorder of arteries and arterioles, unspecified: Secondary | ICD-10-CM

## 2021-05-23 NOTE — Telephone Encounter (Signed)
Had a screening test with [facility] showing mild carotid ultrasound. Arrange for a carotid ultrasound, DX carotid artery disease

## 2021-05-23 NOTE — Telephone Encounter (Signed)
Spoke w/ Pt- he agreed to carotid ultrasound, order placed. Informed Pt to expect call in several days to schedule an appointment.

## 2021-05-26 ENCOUNTER — Ambulatory Visit (HOSPITAL_COMMUNITY)
Admission: RE | Admit: 2021-05-26 | Payer: Medicare Other | Source: Ambulatory Visit | Attending: Internal Medicine | Admitting: Internal Medicine

## 2021-05-30 ENCOUNTER — Ambulatory Visit (HOSPITAL_COMMUNITY)
Admission: RE | Admit: 2021-05-30 | Discharge: 2021-05-30 | Disposition: A | Discharge status: Home / Self Care | Payer: Medicare Other | Source: Ambulatory Visit | Attending: Internal Medicine | Admitting: Internal Medicine

## 2021-05-30 ENCOUNTER — Other Ambulatory Visit: Payer: Self-pay

## 2021-05-30 DIAGNOSIS — I779 Disorder of arteries and arterioles, unspecified: Secondary | ICD-10-CM | POA: Diagnosis not present

## 2021-05-30 DIAGNOSIS — I6523 Occlusion and stenosis of bilateral carotid arteries: Secondary | ICD-10-CM | POA: Diagnosis not present

## 2021-05-31 DIAGNOSIS — Z20822 Contact with and (suspected) exposure to covid-19: Secondary | ICD-10-CM | POA: Diagnosis not present

## 2021-06-22 ENCOUNTER — Ambulatory Visit: Payer: Medicare Other | Admitting: Internal Medicine

## 2021-06-27 ENCOUNTER — Other Ambulatory Visit (INDEPENDENT_AMBULATORY_CARE_PROVIDER_SITE_OTHER): Payer: Medicare Other

## 2021-06-27 ENCOUNTER — Other Ambulatory Visit: Payer: Self-pay

## 2021-06-27 DIAGNOSIS — E1165 Type 2 diabetes mellitus with hyperglycemia: Secondary | ICD-10-CM

## 2021-06-27 DIAGNOSIS — E782 Mixed hyperlipidemia: Secondary | ICD-10-CM

## 2021-06-27 LAB — LIPID PANEL
Cholesterol: 127 mg/dL (ref 0–200)
HDL: 42.3 mg/dL (ref >39.00)
LDL Cholesterol: 63 mg/dL (ref 0–99)
NonHDL: 84.65
Total CHOL/HDL Ratio: 3
Triglycerides: 107 mg/dL (ref 0.0–149.0)
VLDL: 21.4 mg/dL (ref 0.0–40.0)

## 2021-06-27 LAB — COMPREHENSIVE METABOLIC PANEL
ALT: 20 U/L (ref 0–53)
AST: 21 U/L (ref 0–37)
Albumin: 4 g/dL (ref 3.5–5.2)
Alkaline Phosphatase: 54 U/L (ref 39–117)
BUN: 17 mg/dL (ref 6–23)
CO2: 23 mEq/L (ref 19–32)
Calcium: 9.6 mg/dL (ref 8.4–10.5)
Chloride: 105 mEq/L (ref 96–112)
Creatinine, Ser: 1.35 mg/dL (ref 0.40–1.50)
GFR: 48.61 mL/min — ABNORMAL LOW (ref >60.00)
Glucose, Bld: 131 mg/dL — ABNORMAL HIGH (ref 70–99)
Potassium: 3.9 mEq/L (ref 3.5–5.1)
Sodium: 138 mEq/L (ref 135–145)
Total Bilirubin: 0.6 mg/dL (ref 0.2–1.2)
Total Protein: 7.1 g/dL (ref 6.0–8.3)

## 2021-06-27 LAB — HEMOGLOBIN A1C: Hgb A1c MFr Bld: 7.2 % — ABNORMAL HIGH (ref 4.6–6.5)

## 2021-06-29 ENCOUNTER — Other Ambulatory Visit: Payer: Self-pay

## 2021-06-29 ENCOUNTER — Ambulatory Visit (INDEPENDENT_AMBULATORY_CARE_PROVIDER_SITE_OTHER): Payer: Medicare Other | Admitting: Endocrinology

## 2021-06-29 VITALS — BP 94/70 | HR 87 | Ht 72.0 in | Wt 231.6 lb

## 2021-06-29 DIAGNOSIS — E1165 Type 2 diabetes mellitus with hyperglycemia: Secondary | ICD-10-CM | POA: Diagnosis not present

## 2021-06-29 DIAGNOSIS — Z23 Encounter for immunization: Secondary | ICD-10-CM | POA: Diagnosis not present

## 2021-06-29 DIAGNOSIS — I952 Hypotension due to drugs: Secondary | ICD-10-CM

## 2021-06-29 DIAGNOSIS — E291 Testicular hypofunction: Secondary | ICD-10-CM

## 2021-06-29 MED ORDER — EMPAGLIFLOZIN 25 MG PO TABS: 25.0000 mg | ORAL_TABLET | Freq: Every day | ORAL | 1 refills | Status: DC

## 2021-06-29 NOTE — Patient Instructions (Signed)
Stop Amlodipine 

## 2021-06-29 NOTE — Progress Notes (Signed)
Patient ID: Jonathan Leather., male   DOB: [birth year], 85 y.o.   MRN: [id number]    Reason for Appointment:  Follow-up  History of Present Illness:          Diagnosis: Type 2 diabetes mellitus, date of diagnosis:  2009      Past history: He is not clear when his diabetes was diagnosed but according to hospital records it may have been in 2009. Most likely he was placed on metformin initially and at some point Amaryl added. In 2013 it was also given Januvia presumably to improve his control. However appears that his A1c has been consistently over 7% Janumet was started instead of metformin and Januvia separately in 2013  He was started on Trulicity in 3/84 instead of Victoza because excessive bruising on his abdomen  Later in 01/2015 he was switched to Bydureon because of insurance noncoverage of his Trulicity  Recent history:   Non-insulin hypoglycemic drugs are: Metformin 665 2x a day, Trulicity 3.0  mg weekly, Jardiance 12.5 mg daily  His A1c is about the same at 7.2  Fructosamine previously 288  Current blood sugar patterns and problems: His blood sugars are being monitored very sporadically again  He tends to check more readings after dinner but they are generally well controlled  His highest blood sugar is only 150 8 in the morning  Although his weight is slightly lower he has not been any more active than usual, may be doing less because of fatigue Again he is trying to eat healthy meals most of the time  Portion control is better with the 3 mg Trulicity He is still getting Jardiance from the [facility] and doing half of the 25 mg tablets     Side effects from medications have been: None  Glucose monitoring:  done usually once a day       Glucometer:  FreeStyle  Blood Glucose data     PRE-MEAL Fasting Lunch Dinner Bedtime Overall  Glucose range: 129   122 67-158  Mean/median:   146  131   POST-MEAL PC Breakfast PC Lunch PC Dinner  Glucose range:      Mean/median:   113   Previously:  PRE-MEAL Fasting Lunch Dinner Bedtime Overall  Glucose range: 115-159      Mean/median:     130   POST-MEAL PC Breakfast PC Lunch PC Dinner  Glucose range:  1 19-139 135, 153  Mean/median:         Meals: 3 meals per day. eating egg/meat bread for breakfast, dinner 6-7 PM     Dietician visit: Most recent: 4/16 .               Weight history:  Wt Readings from Last 3 Encounters:  06/29/21 231 lb 9.6 oz (105.1 kg)  03/30/21 233 lb (105.7 kg)  03/10/21 234 lb 9.6 oz (106.4 kg)   Glycemic control:   Lab Results  Component Value Date   HGBA1C 7.2 (H) 06/27/2021   HGBA1C 7.4 (H) 03/28/2021   HGBA1C 7.1 (H) 11/15/2020   Lab Results  Component Value Date   MICROALBUR <0.7 11/15/2020   LDLCALC 63 06/27/2021   CREATININE 1.35 06/27/2021    Lab Results  Component Value Date   FRUCTOSAMINE 288 (H) 05/24/2020   FRUCTOSAMINE 284 08/26/2019   FRUCTOSAMINE 284 03/29/2017    OTHER active problems addressed today: See review of systems  Lab on 06/27/2021  Component Date Value Ref Range Status  Cholesterol 06/27/2021 127  0 - 200 mg/dL Final   ATP III Classification       Desirable:  < 200 mg/dL               Borderline High:  200 - 239 mg/dL          High:  > = 240 mg/dL   Triglycerides 06/27/2021 107.0  0.0 - 149.0 mg/dL Final   Normal:  <150 mg/dLBorderline High:  150 - 199 mg/dL   HDL 06/27/2021 42.30  >39.00 mg/dL Final   VLDL 06/27/2021 21.4  0.0 - 40.0 mg/dL Final   LDL Cholesterol 06/27/2021 63  0 - 99 mg/dL Final   Total CHOL/HDL Ratio 06/27/2021 3   Final                  Men          Women1/2 Average Risk     3.4          3.3Average Risk          5.0          4.42X Average Risk          9.6          7.13X Average Risk          15.0          11.0                       NonHDL 06/27/2021 84.65   Final   NOTE:  Non-HDL goal should be 30 mg/dL higher than patient's LDL goal (i.e. LDL goal of < 70 mg/dL, would have non-HDL goal of <  100 mg/dL)   Sodium 06/27/2021 138  135 - 145 mEq/L Final   Potassium 06/27/2021 3.9  3.5 - 5.1 mEq/L Final   Chloride 06/27/2021 105  96 - 112 mEq/L Final   CO2 06/27/2021 23  19 - 32 mEq/L Final   Glucose, Bld 06/27/2021 131 (A) 70 - 99 mg/dL Final   BUN 06/27/2021 17  6 - 23 mg/dL Final   Creatinine, Ser 06/27/2021 1.35  0.40 - 1.50 mg/dL Final   Total Bilirubin 06/27/2021 0.6  0.2 - 1.2 mg/dL Final   Alkaline Phosphatase 06/27/2021 54  39 - 117 U/L Final   AST 06/27/2021 21  0 - 37 U/L Final   ALT 06/27/2021 20  0 - 53 U/L Final   Total Protein 06/27/2021 7.1  6.0 - 8.3 g/dL Final   Albumin 06/27/2021 4.0  3.5 - 5.2 g/dL Final   GFR 06/27/2021 48.61 (A) >60.00 mL/min Final   Calculated using the CKD-EPI Creatinine Equation (2021)   Calcium 06/27/2021 9.6  8.4 - 10.5 mg/dL Final   Hgb A1c MFr Bld 06/27/2021 7.2 (A) 4.6 - 6.5 % Final   Glycemic Control Guidelines for People with Diabetes:Non Diabetic:  <6%Goal of Therapy: <7%Additional Action Suggested:  >8%      Allergies as of 06/29/2021   No Known Allergies      Medication List        Accurate as of June 29, 2021  1:58 PM. If you have any questions, ask your nurse or doctor.          albuterol 108 (90 Base) MCG/ACT inhaler Commonly known as: VENTOLIN HFA Inhale 2 puffs into the lungs every 6 (six) hours as needed for wheezing or shortness of breath.   amLODipine 5 MG tablet Commonly known as: NORVASC  Take 1 tablet (5 mg total) by mouth daily.   aspirin 81 MG tablet Take 81 mg by mouth daily.   empagliflozin 25 MG Tabs tablet Commonly known as: Jardiance Take 1 tablet (25 mg total) by mouth daily with breakfast. What changed:  medication strength how much to take Changed by: Elayne Snare, MD   FreeStyle Freedom Lite w/Device Kit Use Freestyle Freedom Lite meter to check blood sugar twice daily. DX:E11.65   freestyle lancets USE AS INSTRUCTED TO CHECK BLOOD SUGAR TWICE A DAY   FREESTYLE LITE test  strip Generic drug: glucose blood USE TO CHECK BLOOD SUGAR TWICE A DAY AS INSTRUCTED   losartan 100 MG tablet Commonly known as: COZAAR Take 0.5 tablets (50 mg total) by mouth daily.   metFORMIN 1000 MG tablet Commonly known as: GLUCOPHAGE Take 500 mg by mouth daily with breakfast.   metoprolol succinate 100 MG 24 hr tablet Commonly known as: Toprol XL Take 1 tablet (100 mg total) by mouth daily.   multivitamin with minerals Tabs tablet Take 1 tablet by mouth daily.   simvastatin 20 MG tablet Commonly known as: ZOCOR Take 20 mg by mouth at bedtime.   Stiolto Respimat 2.5-2.5 MCG/ACT Aers Generic drug: Tiotropium Bromide-Olodaterol Inhale 2 puffs into the lungs daily as needed (for flares).   triamterene-hydrochlorothiazide 37.5-25 MG tablet Commonly known as: MAXZIDE-25 Take 1 tablet by mouth daily.   Trulicity 3 XB/2.6OM Sopn Generic drug: Dulaglutide INJECT 3 MG UNDER THE SKIN ONCE A WEEK        Allergies: No Known Allergies  Past Medical History:  Diagnosis Date   CAD (coronary artery disease)    Two RCA stents remotely / 3rd RCA stent 2006   Colon polyps    s/p several Cscopes.   COPD (chronic obstructive pulmonary disease) (HCC)    on O2, nocturnal   Diabetes mellitus    dx aprox 2009   ED (erectile dysfunction)    has a vacumm device   Ejection fraction    Hyperlipidemia    dx in 90s   Hypertension    dx in the 67   Hypogonadism male    PVD (peripheral vascular disease) ([place])    s/p stents at LE 2009, Dr Einar Gip   Shortness of breath    O2 Sat dropped to 82% walking on the treadmill, September, 2012    Past Surgical History:  Procedure Laterality Date   APPENDECTOMY     TONSILLECTOMY      Family History  Problem Relation Age of Onset   Heart disease Father    Hypertension Father    Stroke Father    Diabetes Paternal Aunt    Diabetes Maternal Grandmother    Diabetes Other        GM, nephews, many family members   Hyperlipidemia Other         ?   Prostate cancer Brother    Colon cancer Neg Hx     Social History:  reports that he quit smoking about 44 years ago. His smoking use included cigarettes. He has a 60.00 pack-year smoking history. He has never used smokeless tobacco. He reports current alcohol use. He reports that he does not use drugs.    Review of Systems        Lipids: He has been on treatment with simvastatin 20 mg from his PCP below with good control as follows       Lab Results  Component Value Date   CHOL 127 06/27/2021  CHOL 141 02/14/2021   CHOL 117 02/24/2020   Lab Results  Component Value Date   HDL 42.30 06/27/2021   HDL 38.30 (L) 02/14/2021   HDL 37.00 (L) 02/24/2020   Lab Results  Component Value Date   LDLCALC 63 06/27/2021   LDLCALC 64 02/14/2021   LDLCALC 63 02/24/2020   Lab Results  Component Value Date   TRIG 107.0 06/27/2021   TRIG 195.0 (H) 02/14/2021   TRIG 87.0 02/24/2020   Lab Results  Component Value Date   CHOLHDL 3 06/27/2021   CHOLHDL 4 02/14/2021   CHOLHDL 3 02/24/2020   Lab Results  Component Value Date   LDLDIRECT 55.0 01/01/2018                    HYPOGONADISM He had decreased libido and erectile dysfunction previously and was found to have hypogonadism, probably in 2011. He did have a slightly low free testosterone level in 2012 on treatment Prolactin level normal  Previously on AndroGel With his hemoglobin going up to 19.1 in August 2019 he has been told not to take any testosterone He was given clomiphene as a trial but he did not pick up the prescription for unknown reasons  Testosterone level is persistently low without testosterone therapy  He does complain of some fatigue/weakness more recently   Lab Results  Component Value Date   TESTOSTERONE 215.23 (L) 05/24/2020   TESTOSTERONE 257.22 (L) 03/10/2020   TESTOSTERONE 210.21 (L) 06/30/2019    He had been evaluated by hematologist for his erythrocytosis Hemoglobin has been high  normal or slightly high   CBC Latest Ref Rng & Units 11/15/2020 05/24/2020 02/08/2020  WBC 4.0 - 10.5 K/uL 8.0 6.3 7.8  Hemoglobin 13.0 - 17.0 g/dL 17.2(H) 17.7(H) 16.2  Hematocrit 39.0 - 52.0 % 50.4 52.6(H) 48.3  Platelets 150.0 - 400.0 K/uL 201.0 190.0 193.0        HYPERTENSION: This is followed by PCP and is on 50 mg of losartan along with half Maxide, amlodipine 5 mg and metoprolol   He is checking at home periodically  BP at home usually in the 120s/70s but it is unusually low today checked twice  BP Readings from Last 3 Encounters:  06/29/21 94/70  03/30/21 118/72  03/10/21 112/60   RENAL dysfunction: His creatinine is relatively better   Lab Results  Component Value Date   CREATININE 1.35 06/27/2021   CREATININE 1.68 (H) 03/28/2021   CREATININE 1.46 11/15/2020   NEUROPATHY: He has had persistent sensory loss, also has tingling in his distal feet   LABS:  Lab on 06/27/2021  Component Date Value Ref Range Status   Cholesterol 06/27/2021 127  0 - 200 mg/dL Final   ATP III Classification       Desirable:  < 200 mg/dL               Borderline High:  200 - 239 mg/dL          High:  > = 240 mg/dL   Triglycerides 06/27/2021 107.0  0.0 - 149.0 mg/dL Final   Normal:  <150 mg/dLBorderline High:  150 - 199 mg/dL   HDL 06/27/2021 42.30  >39.00 mg/dL Final   VLDL 06/27/2021 21.4  0.0 - 40.0 mg/dL Final   LDL Cholesterol 06/27/2021 63  0 - 99 mg/dL Final   Total CHOL/HDL Ratio 06/27/2021 3   Final                  Men  Women1/2 Average Risk     3.4          3.3Average Risk          5.0          4.42X Average Risk          9.6          7.13X Average Risk          15.0          11.0                       NonHDL 06/27/2021 84.65   Final   NOTE:  Non-HDL goal should be 30 mg/dL higher than patient's LDL goal (i.e. LDL goal of < 70 mg/dL, would have non-HDL goal of < 100 mg/dL)   Sodium 06/27/2021 138  135 - 145 mEq/L Final   Potassium 06/27/2021 3.9  3.5 - 5.1 mEq/L Final    Chloride 06/27/2021 105  96 - 112 mEq/L Final   CO2 06/27/2021 23  19 - 32 mEq/L Final   Glucose, Bld 06/27/2021 131 (A) 70 - 99 mg/dL Final   BUN 06/27/2021 17  6 - 23 mg/dL Final   Creatinine, Ser 06/27/2021 1.35  0.40 - 1.50 mg/dL Final   Total Bilirubin 06/27/2021 0.6  0.2 - 1.2 mg/dL Final   Alkaline Phosphatase 06/27/2021 54  39 - 117 U/L Final   AST 06/27/2021 21  0 - 37 U/L Final   ALT 06/27/2021 20  0 - 53 U/L Final   Total Protein 06/27/2021 7.1  6.0 - 8.3 g/dL Final   Albumin 06/27/2021 4.0  3.5 - 5.2 g/dL Final   GFR 06/27/2021 48.61 (A) >60.00 mL/min Final   Calculated using the CKD-EPI Creatinine Equation (2021)   Calcium 06/27/2021 9.6  8.4 - 10.5 mg/dL Final   Hgb A1c MFr Bld 06/27/2021 7.2 (A) 4.6 - 6.5 % Final   Glycemic Control Guidelines for People with Diabetes:Non Diabetic:  <6%Goal of Therapy: <7%Additional Action Suggested:  >8%     Physical Examination:  BP 94/70 (BP Location: Right Arm, Patient Position: Standing)   Pulse 87   Ht 6' (1.829 m)   Wt 231 lb 9.6 oz (105.1 kg)   SpO2 95%   BMI 31.41 kg/m       ASSESSMENT/PLAN:   Diabetes type 2, with mild obesity, non-insulin-dependent  His A1c is stable at 7.2  Currently on 3 mg Trulicity, 25.3 mg mg Jardiance and Metformin  Most of his blood sugars are excellent and better than expected for his A1c  He has maintained his weight and is usually trying to be as active as possible and eat healthy He is tolerating all his medications well and has no side effects with Trulicity, no renal dysfunction with Jardiance currently   HYPERTENSION: Blood pressure is excessively low and he is having some nonspecific weakness On multiple medications We will have him hold off on his amlodipine and follow-up with his PCP  RENAL dysfunction: Variable and this is relatively better now  LIPIDS: Excellent control  History of erythrocytosis: He will need to follow-up with PCP for periodic monitoring  Hypogonadism:  Unable to treat because of erythrocytosis but may consider Natesto if available on his formulary   Patient Instructions  Stop Amlodipine  Flu vaccine given  Elayne Snare 06/29/2021, 1:58 PM   Note: This office note was prepared with Dragon voice recognition system technology. Any transcriptional errors that result from  this process are unintentional.

## 2021-07-03 DIAGNOSIS — Z23 Encounter for immunization: Secondary | ICD-10-CM | POA: Diagnosis not present

## 2021-07-05 ENCOUNTER — Ambulatory Visit: Payer: Medicare Other

## 2021-07-05 NOTE — Progress Notes (Deleted)
Subjective:   Jonathan [name]. is a 85 y.o. male who presents for Medicare Annual/Subsequent preventive examination.   Review of Systems    ***       Objective:    There were no vitals filed for this visit. There is no height or weight on file to calculate BMI.  Advanced Directives 11/19/2019 06/09/2019 05/21/2019 11/18/2018 08/19/2018 06/06/2018 06/05/2017  Does Patient Have a Medical Advance Directive? No No No No No No Yes  Type of Advance Directive - - - - - - Press photographer;Living will  Does patient want to make changes to medical advance directive? No - Patient declined - - - - - -  Copy of Press photographer in Chart? - - - - - - No - copy requested  Would patient like information on creating a medical advance directive? No - Patient declined No - Patient declined No - Patient declined No - Patient declined No - Patient declined Yes (MAU/Ambulatory/Procedural Areas - Information given) -    Current Medications (verified) Outpatient Encounter Medications as of 07/05/2021  Medication Sig   albuterol (VENTOLIN HFA) 108 (90 Base) MCG/ACT inhaler Inhale 2 puffs into the lungs every 6 (six) hours as needed for wheezing or shortness of breath.   amLODipine (NORVASC) 5 MG tablet Take 1 tablet (5 mg total) by mouth daily.   aspirin 81 MG tablet Take 81 mg by mouth daily.   Blood Glucose Monitoring Suppl (FREESTYLE FREEDOM LITE) w/Device KIT Use Freestyle Freedom Lite meter to check blood sugar twice daily. DX:E11.65   empagliflozin (JARDIANCE) 25 MG TABS tablet Take 1 tablet (25 mg total) by mouth daily with breakfast.   FREESTYLE LITE test strip USE TO CHECK BLOOD SUGAR TWICE A DAY AS INSTRUCTED   Lancets (FREESTYLE) lancets USE AS INSTRUCTED TO CHECK BLOOD SUGAR TWICE A DAY   losartan (COZAAR) 100 MG tablet Take 0.5 tablets (50 mg total) by mouth daily.   metFORMIN (GLUCOPHAGE) 1000 MG tablet Take 500 mg by mouth daily with breakfast.    metoprolol succinate  (TOPROL XL) 100 MG 24 hr tablet Take 1 tablet (100 mg total) by mouth daily.   Multiple Vitamin (MULTIVITAMIN WITH MINERALS) TABS tablet Take 1 tablet by mouth daily.   simvastatin (ZOCOR) 20 MG tablet Take 20 mg by mouth at bedtime.    Tiotropium Bromide-Olodaterol (STIOLTO RESPIMAT) 2.5-2.5 MCG/ACT AERS Inhale 2 puffs into the lungs daily as needed (for flares).   triamterene-hydrochlorothiazide (MAXZIDE-25) 37.5-25 MG tablet Take 1 tablet by mouth daily.    TRULICITY 3 WR/6.0AV SOPN INJECT 3 MG UNDER THE SKIN ONCE A WEEK   No facility-administered encounter medications on file as of 07/05/2021.    Allergies (verified) Patient has no known allergies.   History: Past Medical History:  Diagnosis Date   CAD (coronary artery disease)    Two RCA stents remotely / 3rd RCA stent 2006   Colon polyps    s/p several Cscopes.   COPD (chronic obstructive pulmonary disease) (HCC)    on O2, nocturnal   Diabetes mellitus    dx aprox 2009   ED (erectile dysfunction)    has a vacumm device   Ejection fraction    Hyperlipidemia    dx in 90s   Hypertension    dx in the 15   Hypogonadism male    PVD (peripheral vascular disease) ([place])    s/p stents at [facility] 2009, Dr Einar Gip   Shortness of breath  O2 Sat dropped to 82% walking on the treadmill, September, 2012   Past Surgical History:  Procedure Laterality Date   APPENDECTOMY     TONSILLECTOMY     Family History  Problem Relation Age of Onset   Heart disease Father    Hypertension Father    Stroke Father    Diabetes Paternal Aunt    Diabetes Maternal Grandmother    Diabetes Other        GM, nephews, many family members   Hyperlipidemia Other        ?   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   Tobacco Use   Smoking status: Former    Packs/day: 2.00    Years: 30.00    Pack years: 60.00    Types: Cigarettes    Quit date: 06/17/1977    Years since quitting: 44.0   Smokeless tobacco: Never   Tobacco comments:    2 ppd, quit 1987  Vaping Use   Vaping Use: Never used  Substance and Sexual Activity   Alcohol use: Yes    Alcohol/week: 0.0 standard drinks    Comment: socially    Drug use: No   Sexual activity: Yes  Other Topics Concern   Not on file  Social History Narrative   4 children (lost 1 son)   Wife has 2 children                Social Determinants of Radio broadcast assistant Strain: Not on file  Food Insecurity: Not on file  Transportation Needs: Not on file  Physical Activity: Not on file  Stress: Not on file  Social Connections: Not on file    Tobacco Counseling Counseling given: Not Answered Tobacco comments: 2 ppd, quit 1987   Clinical Intake:                 Diabetes:  Is the patient diabetic?  Yes  If diabetic, was a CBG obtained today?  No  Did the patient bring in their glucometer from home?  {YES/NO:21197} How often do you monitor your CBG's? ***.   Financial Strains and Diabetes Management:  Are you having any financial strains with the device, your supplies or your medication? {YES/NO:21197}.  Does the patient want to be seen by Chronic Care Management for management of their diabetes?  {YES/NO:21197} Would the patient like to be referred to a Nutritionist or for Diabetic Management?  {YES/NO:21197}  Diabetic Exams:  Diabetic Eye Exam: Completed ***. Overdue for diabetic eye exam. Pt has been advised about the importance in completing this exam. A referral has been placed today. Message sent to referral coordinator for scheduling purposes. Advised pt to expect a call from our office re: appt.  Diabetic Foot Exam: Completed 10/17/2020.           Activities of  Daily Living No flowsheet data found.  Patient Care Team: Colon Branch, MD as PCP - General Dorothy Spark, MD (Inactive) as PCP - Cardiology (Cardiology) Elayne Snare, MD as Consulting Physician (Endocrinology) Deneise Lever, MD as Consulting Physician (Pulmonary Disease) Clent Jacks, MD as Consulting  Physician (Ophthalmology) Camelia Phenes, DPM (Inactive) as Consulting Physician (Podiatry) Dorothy Spark, MD (Inactive) as Consulting Physician (Cardiology) Carolan Clines, MD (Inactive) as Consulting Physician (Urology) Juanita Craver, MD as Consulting Physician (Gastroenterology)  Indicate any recent Medical Services you may have received from other than [facility] providers in the past year (date may be approximate).     Assessment:   This is a routine wellness examination for [place].  Hearing/Vision screen No results found.  Dietary issues and exercise activities discussed:     Goals Addressed   None    Depression Screen PHQ 2/9 Scores 02/14/2021 10/17/2020 06/28/2020 06/09/2019 06/06/2018 06/05/2017 10/08/2016  PHQ - 2 Score 0 0 0 0 0 0 0  PHQ- 9 Score - 2 - - - - -    Fall Risk Fall Risk  02/14/2021 06/28/2020 06/09/2019 06/06/2018 06/05/2017  Falls in the past year? 0 0 0 No No  Number falls in past yr: 0 0 - - -  Injury with Fall? 0 0 - - -  Follow up - Falls evaluation completed - - -    FALL RISK PREVENTION PERTAINING TO THE HOME:  Any stairs in or around the home? {YES/NO:21197} If so, are there any without handrails? {YES/NO:21197} Home free of loose throw rugs in walkways, pet beds, electrical cords, etc? {YES/NO:21197} Adequate lighting in your home to reduce risk of falls? {YES/NO:21197}  ASSISTIVE DEVICES UTILIZED TO PREVENT FALLS:  Life alert? {YES/NO:21197} Use of a cane, walker or w/c? {YES/NO:21197} Grab bars in the bathroom? {YES/NO:21197} Shower chair or bench in shower? {YES/NO:21197} Elevated toilet seat or a handicapped toilet?  {YES/NO:21197}  TIMED UP AND GO:  Was the test performed? {YES/NO:21197}.  Length of time to ambulate 10 feet: *** sec.   {Appearance of JJHE:1740814}  Cognitive Function:        Immunizations Immunization History  Administered Date(s) Administered   Fluad Quad(high Dose 65+) 05/07/2019, 06/06/2020, 06/29/2021   Influenza Split 06/18/2012, 06/17/2020   Influenza Whole 05/02/2010, 06/18/2011   Influenza, High Dose Seasonal PF 05/18/2013, 06/17/2014, 07/19/2015, 06/19/2016, 07/18/2016, 06/05/2017, 07/16/2017, 06/06/2018, 07/22/2018   Influenza,inj,Quad PF,6+ Mos 06/04/2013, 06/10/2014, 07/06/2015   Influenza-Unspecified 07/31/2000, 06/17/2012   Moderna Sars-Covid-2 Vaccination 10/13/2019, 11/10/2019, 07/18/2020, 12/16/2020   Pneumococcal Conjugate-13 03/17/2013, 01/18/2014   Pneumococcal Polysaccharide-23 06/21/2010, 10/17/2018   Pneumococcal-Unspecified 05/19/2011   Td 09/17/2004, 12/16/2013   Tdap 05/18/2005, 11/18/2014   Tetanus 12/16/2013   Zoster Recombinat (Shingrix) 10/21/2018, 04/06/2019    TDAP status: Up to date  Flu Vaccine status: Up to date  Pneumococcal vaccine status: Up to date  {Covid-19 vaccine status:2101808}  Qualifies for Shingles Vaccine? No   Zostavax completed No   Shingrix Completed?: Yes  Screening Tests Health Maintenance  Topic Date Due   OPHTHALMOLOGY EXAM  11/12/2020   FOOT EXAM  10/17/2021   COLONOSCOPY (Pts 45-39yr Insurance coverage will need to be confirmed)  12/05/2021   HEMOGLOBIN A1C  12/26/2021   TETANUS/TDAP  11/17/2024   INFLUENZA VACCINE  Completed   COVID-19 Vaccine  Completed   Zoster Vaccines- Shingrix  Completed   HPV VACCINES  Aged Out    Health Maintenance  Health Maintenance Due  Topic Date Due   OPHTHALMOLOGY EXAM  11/12/2020    Colorectal cancer screening: No longer required.   Lung Cancer Screening: (Low Dose CT Chest recommended if Age 83-80years, 30 pack-year currently smoking OR have quit w/in  15years.) does not qualify.     Additional Screening:  Hepatitis C Screening: does not  qualify  Vision Screening: Recommended annual ophthalmology exams for early detection of glaucoma and other disorders of the eye. Is the patient up to date with their annual eye exam?  {YES/NO:21197} Who is the provider or what is the name of the office in which the patient attends annual eye exams? *** If pt is not established with a provider, would they like to be referred to a provider to establish care? {YES/NO:21197}.   Dental Screening: Recommended annual dental exams for proper oral hygiene  Community Resource Referral / Chronic Care Management: CRR required this visit?  {YES/NO:21197}  CCM required this visit?  {YES/NO:21197}     Plan:     I have personally reviewed and noted the following in the patient's chart:   Medical and social history Use of alcohol, tobacco or illicit drugs  Current medications and supplements including opioid prescriptions. {Opioid Prescriptions:7045690503} Functional ability and status Nutritional status Physical activity Advanced directives List of other physicians Hospitalizations, surgeries, and ER visits in previous 12 months Vitals Screenings to include cognitive, depression, and falls Referrals and appointments  In addition, I have reviewed and discussed with patient certain preventive protocols, quality metrics, and best practice recommendations. A written personalized care plan for preventive services as well as general preventive health recommendations were provided to patient.     Marta Antu, LPN   [phone]  Nurse Health Advisor  Nurse Notes: ***

## 2021-07-20 ENCOUNTER — Ambulatory Visit (INDEPENDENT_AMBULATORY_CARE_PROVIDER_SITE_OTHER): Payer: Medicare Other

## 2021-07-20 ENCOUNTER — Other Ambulatory Visit: Payer: Self-pay

## 2021-07-20 VITALS — BP 108/70 | HR 67 | Temp 97.9°F | Resp 16 | Ht 72.0 in | Wt 232.8 lb

## 2021-07-20 DIAGNOSIS — Z0000 Encounter for general adult medical examination without abnormal findings: Secondary | ICD-10-CM | POA: Diagnosis not present

## 2021-07-20 NOTE — Patient Instructions (Signed)
Jonathan [name] , Thank you for taking time to come for your Medicare Wellness Visit. I appreciate your ongoing commitment to your health goals. Please review the following plan we discussed and let me know if I can assist you in the future.   Screening recommendations/referrals: Colonoscopy: No longer required Recommended yearly ophthalmology/optometry visit for glaucoma screening and checkup Recommended yearly dental visit for hygiene and checkup  Vaccinations: Influenza vaccine: UP to date Pneumococcal vaccine: Up to date Tdap vaccine: Up to date-Due-11/17/2024 Shingles vaccine: Completed vaccines   Covid-19: Up to date  Advanced directives: Please bring a copy of Living Will and/or Healthcare Power of Attorney for your chart.   Conditions/risks identified: See problem list  Next appointment: Follow up in one year for your annual wellness visit. 07/23/2022 @ 9:00  Preventive Care 83 Years and Older, Male Preventive care refers to lifestyle choices and visits with your health care provider that can promote health and wellness. What does preventive care include? A yearly physical exam. This is also called an annual well check. Dental exams once or twice a year. Routine eye exams. Ask your health care provider how often you should have your eyes checked. Personal lifestyle choices, including: Daily care of your teeth and gums. Regular physical activity. Eating a healthy diet. Avoiding tobacco and drug use. Limiting alcohol use. Practicing safe sex. Taking low doses of aspirin every day. Taking vitamin and mineral supplements as recommended by your health care provider. What happens during an annual well check? The services and screenings done by your health care provider during your annual well check will depend on your age, overall health, lifestyle risk factors, and family history of disease. Counseling  Your health care provider may ask you questions about your: Alcohol  use. Tobacco use. Drug use. Emotional well-being. Home and relationship well-being. Sexual activity. Eating habits. History of falls. Memory and ability to understand (cognition). Work and work Statistician. Screening  You may have the following tests or measurements: Height, weight, and BMI. Blood pressure. Lipid and cholesterol levels. These may be checked every 5 years, or more frequently if you are over 53 years old. Skin check. Lung cancer screening. You may have this screening every year starting at age 29 if you have a 30-pack-year history of smoking and currently smoke or have quit within the past 15 years. Fecal occult blood test (FOBT) of the stool. You may have this test every year starting at age 19. Flexible sigmoidoscopy or colonoscopy. You may have a sigmoidoscopy every 5 years or a colonoscopy every 10 years starting at age 23. Prostate cancer screening. Recommendations will vary depending on your family history and other risks. Hepatitis C blood test. Hepatitis B blood test. Sexually transmitted disease (STD) testing. Diabetes screening. This is done by checking your blood sugar (glucose) after you have not eaten for a while (fasting). You may have this done every 1-3 years. Abdominal aortic aneurysm (AAA) screening. You may need this if you are a current or former smoker. Osteoporosis. You may be screened starting at age 64 if you are at high risk. Talk with your health care provider about your test results, treatment options, and if necessary, the need for more tests. Vaccines  Your health care provider may recommend certain vaccines, such as: Influenza vaccine. This is recommended every year. Tetanus, diphtheria, and acellular pertussis (Tdap, Td) vaccine. You may need a Td booster every 10 years. Zoster vaccine. You may need this after age 40. Pneumococcal 13-valent conjugate (PCV13) vaccine.  One dose is recommended after age 74. Pneumococcal polysaccharide  (PPSV23) vaccine. One dose is recommended after age 6. Talk to your health care provider about which screenings and vaccines you need and how often you need them. This information is not intended to replace advice given to you by your health care provider. Make sure you discuss any questions you have with your health care provider. Document Released: 09/30/2015 Document Revised: 05/23/2016 Document Reviewed: 07/05/2015 Elsevier Interactive Patient Education  2017 [employer] Prevention in the Home Falls can cause injuries. They can happen to people of all ages. There are many things you can do to make your home safe and to help prevent falls. What can I do on the outside of my home? Regularly fix the edges of walkways and driveways and fix any cracks. Remove anything that might make you trip as you walk through a door, such as a raised step or threshold. Trim any bushes or trees on the path to your home. Use bright outdoor lighting. Clear any walking paths of anything that might make someone trip, such as rocks or tools. Regularly check to see if handrails are loose or broken. Make sure that both sides of any steps have handrails. Any raised decks and porches should have guardrails on the edges. Have any leaves, snow, or ice cleared regularly. Use sand or salt on walking paths during winter. Clean up any spills in your garage right away. This includes oil or grease spills. What can I do in the bathroom? Use night lights. Install grab bars by the toilet and in the tub and shower. Do not use towel bars as grab bars. Use non-skid mats or decals in the tub or shower. If you need to sit down in the shower, use a plastic, non-slip stool. Keep the floor dry. Clean up any water that spills on the floor as soon as it happens. Remove soap buildup in the tub or shower regularly. Attach bath mats securely with double-sided non-slip rug tape. Do not have throw rugs and other things on the  floor that can make you trip. What can I do in the bedroom? Use night lights. Make sure that you have a light by your bed that is easy to reach. Do not use any sheets or blankets that are too big for your bed. They should not hang down onto the floor. Have a firm chair that has side arms. You can use this for support while you get dressed. Do not have throw rugs and other things on the floor that can make you trip. What can I do in the kitchen? Clean up any spills right away. Avoid walking on wet floors. Keep items that you use a lot in easy-to-reach places. If you need to reach something above you, use a strong step stool that has a grab bar. Keep electrical cords out of the way. Do not use floor polish or wax that makes floors slippery. If you must use wax, use non-skid floor wax. Do not have throw rugs and other things on the floor that can make you trip. What can I do with my stairs? Do not leave any items on the stairs. Make sure that there are handrails on both sides of the stairs and use them. Fix handrails that are broken or loose. Make sure that handrails are as long as the stairways. Check any carpeting to make sure that it is firmly attached to the stairs. Fix any carpet that is loose or  worn. Avoid having throw rugs at the top or bottom of the stairs. If you do have throw rugs, attach them to the floor with carpet tape. Make sure that you have a light switch at the top of the stairs and the bottom of the stairs. If you do not have them, ask someone to add them for you. What else can I do to help prevent falls? Wear shoes that: Do not have high heels. Have rubber bottoms. Are comfortable and fit you well. Are closed at the toe. Do not wear sandals. If you use a stepladder: Make sure that it is fully opened. Do not climb a closed stepladder. Make sure that both sides of the stepladder are locked into place. Ask someone to hold it for you, if possible. Clearly mark and make  sure that you can see: Any grab bars or handrails. First and last steps. Where the edge of each step is. Use tools that help you move around (mobility aids) if they are needed. These include: Canes. Walkers. Scooters. Crutches. Turn on the lights when you go into a dark area. Replace any light bulbs as soon as they burn out. Set up your furniture so you have a clear path. Avoid moving your furniture around. If any of your floors are uneven, fix them. If there are any pets around you, be aware of where they are. Review your medicines with your doctor. Some medicines can make you feel dizzy. This can increase your chance of falling. Ask your doctor what other things that you can do to help prevent falls. This information is not intended to replace advice given to you by your health care provider. Make sure you discuss any questions you have with your health care provider. Document Released: 06/30/2009 Document Revised: 02/09/2016 Document Reviewed: 10/08/2014 Elsevier Interactive Patient Education  2017 [employer].

## 2021-07-20 NOTE — Progress Notes (Addendum)
Subjective:   Jonathan [name]. is a 85 y.o. male who presents for Medicare Annual/Subsequent preventive examination.  Review of Systems     Cardiac Risk Factors include: advanced age (>36mn, >>27women);male gender;diabetes mellitus;dyslipidemia;hypertension;obesity (BMI >30kg/m2)     Objective:    Today's Vitals   07/20/21 0812  BP: 108/70  Pulse: 67  Resp: 16  Temp: 97.9 F (36.6 C)  TempSrc: Temporal  SpO2: 95%  Weight: 232 lb 12.8 oz (105.6 kg)  Height: 6' (1.829 m)   Body mass index is 31.57 kg/m.  Advanced Directives 07/20/2021 11/19/2019 06/09/2019 05/21/2019 11/18/2018 08/19/2018 06/06/2018  Does Patient Have a Medical Advance Directive? Yes _0  No  Type of [place] - - - - - -  Does patient want to make changes to medical advance directive? - No - Patient declined - - - - -  Copy of HTup[place]in Chart? No - copy requested - - - - - -  Would patient like information on creating a medical advance directive? - No - Patient declined No - Patient declined No - Patient declined No - Patient declined No - Patient declined Yes (MAU/Ambulatory/Procedural Areas - Information given)    Current Medications (verified) Outpatient Encounter Medications as of 07/20/2021  Medication Sig   albuterol (VENTOLIN HFA) 108 (90 Base) MCG/ACT inhaler Inhale 2 puffs into the lungs every 6 (six) hours as needed for wheezing or shortness of breath.   amLODipine (NORVASC) 5 MG tablet Take 1 tablet (5 mg total) by mouth daily.   aspirin 81 MG tablet Take 81 mg by mouth daily.   Blood Glucose Monitoring Suppl (FREESTYLE FREEDOM LITE) w/Device KIT Use Freestyle Freedom Lite meter to check blood sugar twice daily. DX:E11.65   empagliflozin (JARDIANCE) 25 MG TABS tablet Take 1 tablet (25 mg total) by mouth daily with breakfast.   FREESTYLE LITE test strip USE TO CHECK BLOOD SUGAR TWICE A DAY AS INSTRUCTED   Lancets  (FREESTYLE) lancets USE AS INSTRUCTED TO CHECK BLOOD SUGAR TWICE A DAY   losartan (COZAAR) 100 MG tablet Take 0.5 tablets (50 mg total) by mouth daily.   metFORMIN (GLUCOPHAGE) 1000 MG tablet Take 500 mg by mouth daily with breakfast.    metoprolol succinate (TOPROL XL) 100 MG 24 hr tablet Take 1 tablet (100 mg total) by mouth daily.   Multiple Vitamin (MULTIVITAMIN WITH MINERALS) TABS tablet Take 1 tablet by mouth daily.   simvastatin (ZOCOR) 20 MG tablet Take 20 mg by mouth at bedtime.    Tiotropium Bromide-Olodaterol (STIOLTO RESPIMAT) 2.5-2.5 MCG/ACT AERS Inhale 2 puffs into the lungs daily as needed (for flares).   triamterene-hydrochlorothiazide (MAXZIDE-25) 37.5-25 MG tablet Take 1 tablet by mouth daily.    TRULICITY 3 MTX/7.7SFSOPN INJECT 3 MG UNDER THE SKIN ONCE A WEEK   No facility-administered encounter medications on file as of 07/20/2021.    Allergies (verified) Patient has no known allergies.   History: Past Medical History:  Diagnosis Date   CAD (coronary artery disease)    Two RCA stents remotely / 3rd RCA stent 2006   Colon polyps    s/p several Cscopes.   COPD (chronic obstructive pulmonary disease) (HCC)    on O2, nocturnal   Diabetes mellitus    dx aprox 2009   ED (erectile dysfunction)    has a vacumm device   Ejection fraction    Hyperlipidemia    dx in 90s  Hypertension    dx in the 21   Hypogonadism male    PVD (peripheral vascular disease) ([place])    s/p stents at LE 2009, Dr Einar Gip   Shortness of breath    O2 Sat dropped to 82% walking on the treadmill, September, 2012   Past Surgical History:  Procedure Laterality Date   APPENDECTOMY     TONSILLECTOMY     Family History  Problem Relation Age of Onset   Heart disease Father    Hypertension Father    Stroke Father    Diabetes Paternal Aunt    Diabetes Maternal Grandmother    Diabetes Other        GM, nephews, many family members   Hyperlipidemia Other        ?   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   Tobacco Use   Smoking status: Former    Packs/day: 2.00    Years: 30.00    Pack years: 60.00    Types: Cigarettes    Quit date: 06/17/1977    Years since quitting: 44.1   Smokeless tobacco: Never   Tobacco comments:    2 ppd, quit 1987  Vaping Use   Vaping Use: Never used  Substance and Sexual Activity   Alcohol use: Yes    Alcohol/week: 0.0 standard drinks    Comment: socially    Drug use: No   Sexual activity: Yes  Other Topics Concern   Not on file  Social History Narrative   4 children (lost 1 son)   Wife has 2 children                Social Determinants of Radio broadcast assistant Strain: Low Risk    Difficulty of Paying Living Expenses: Not hard at all  Food Insecurity: No Food Insecurity   Worried About Charity fundraiser in the Last Year: Never true   Ran Out of Food in the Last Year: Never true  Transportation Needs: No Transportation Needs   Lack of Transportation (Medical): No   Lack of Transportation (Non-Medical): No  Physical Activity: Inactive   Days of Exercise per Week: 0 days   Minutes of Exercise per Session: 0 min  Stress: No Stress Concern Present   Feeling of Stress : Not at all  Social Connections: Socially Integrated   Frequency of Communication with Friends and Family: More than three times a week   Frequency of Social Gatherings with Friends and Family: More than three times a week   Attends Religious Services: More than 4 times per year   Active Member of [employer] or Organizations: Yes   Attends Music therapist: More than 4 times per year   Marital Status: Married    Tobacco Counseling Counseling given: Not  Answered Tobacco comments: 2 ppd, quit 1987   Clinical Intake:  Pre-visit preparation completed: Yes  Pain : No/denies pain     BMI - recorded: 31.57 Nutritional Status: BMI > 30  Obese Nutritional Risks: None Diabetes: Yes CBG done?: No Did pt. bring in CBG monitor from home?: No  How often do  you need to have someone help you when you read instructions, pamphlets, or other written materials from your doctor or pharmacy?: 1 - Never  Diabetes:  Is the patient diabetic?  Yes  If diabetic, was a CBG obtained today?  No  Did the patient bring in their glucometer from home?  No  How often do you monitor your CBG's? occasionally.   Financial Strains and Diabetes Management:  Are you having any financial strains with the device, your supplies or your medication? No .  Does the patient want to be seen by Chronic Care Management for management of their diabetes?  No  Would the patient like to be referred to a Nutritionist or for Diabetic Management?  No   Diabetic Exams:  Diabetic Eye Exam: Completed within the past year at the [place] clinic per patient..  Diabetic Foot Exam: Completed 10/17/2020.    Interpreter Needed?: No  Information entered by :: Caroleen Hamman LPN   Activities of Daily Living In your present state of health, do you have any difficulty performing the following activities: 07/20/2021  Hearing? N  Vision? N  Difficulty concentrating or making decisions? N  Walking or climbing stairs? N  Dressing or bathing? N  Doing errands, shopping? N  Preparing Food and eating ? N  Using the Toilet? N  In the past six months, have you accidently leaked urine? Y  Do you have problems with loss of bowel control? N  Managing your Medications? N  Managing your Finances? N  Housekeeping or managing your Housekeeping? N  Some recent data might be hidden    Patient Care Team: Colon Branch, MD as PCP - General Meda Coffee Jamse Belfast, MD (Inactive) as PCP - Cardiology  (Cardiology) Elayne Snare, MD as Consulting Physician (Endocrinology) Deneise Lever, MD as Consulting Physician (Pulmonary Disease) Clent Jacks, MD as Consulting Physician (Ophthalmology) Camelia Phenes, DPM (Inactive) as Consulting Physician (Podiatry) Dorothy Spark, MD (Inactive) as Consulting Physician (Cardiology) Carolan Clines, MD (Inactive) as Consulting Physician (Urology) Juanita Craver, MD as Consulting Physician (Gastroenterology)  Indicate any recent Medical Services you may have received from other than [facility] providers in the past year (date may be approximate).     Assessment:   This is a routine wellness examination for [place].  Hearing/Vision screen Hearing Screening - Comments:: No issues Vision Screening - Comments:: Wears glasses [facility]  Dietary issues and exercise activities discussed: Current Exercise Habits: The patient does not participate in regular exercise at present, Exercise limited by: None identified   Goals Addressed             This Visit's Progress    Patient Stated       Maintain current health       Depression Screen PHQ 2/9 Scores 07/20/2021 02/14/2021 10/17/2020 06/28/2020 06/09/2019 06/06/2018 06/05/2017  PHQ - 2 Score 0 0 0 0 0 0 0  PHQ- 9 Score - - 2 - - - -    Fall Risk Fall Risk  07/20/2021 02/14/2021 06/28/2020 06/09/2019 06/06/2018  Falls in the past year? 0 0 0 0 No  Number falls in past yr: 0 0 0 - -  Injury with Fall? 0 0 0 - -  Follow up Falls prevention discussed - Falls evaluation completed - -    FALL RISK PREVENTION PERTAINING TO THE HOME:  Any stairs in or around the home? Yes  If so, are there any without handrails? No  Home free of loose throw  rugs in walkways, pet beds, electrical cords, etc? Yes  Adequate lighting in your home to reduce risk of falls? Yes   ASSISTIVE DEVICES UTILIZED TO PREVENT FALLS:  Life alert? No  Use of a cane, walker or w/c? No  Grab bars in the bathroom?  Yes  Shower chair or bench in shower? No  Elevated toilet seat or a handicapped toilet? No   TIMED UP AND GO:  Was the test performed? Yes .  Length of time to ambulate 10 feet: 10 sec.   Gait steady and fast without use of assistive device  Cognitive Function:Normal cognitive status assessed by direct observation by this Nurse Health Advisor. No abnormalities found.          Immunizations Immunization History  Administered Date(s) Administered   Fluad Quad(high Dose 65+) 05/07/2019, 06/06/2020, 06/29/2021   Influenza Split 06/18/2012, 06/17/2020   Influenza Whole 05/02/2010, 06/18/2011   Influenza, High Dose Seasonal PF 05/18/2013, 06/17/2014, 07/19/2015, 06/19/2016, 07/18/2016, 06/05/2017, 07/16/2017, 06/06/2018, 07/22/2018   Influenza,inj,Quad PF,6+ Mos 06/04/2013, 06/10/2014, 07/06/2015   Influenza-Unspecified 07/31/2000, 06/17/2012   Moderna Sars-Covid-2 Vaccination 10/13/2019, 11/10/2019, 07/18/2020, 12/16/2020   Pfizer Covid-19 Vaccine Bivalent Booster 5y-11y 06/19/2021   Pneumococcal Conjugate-13 03/17/2013, 01/18/2014   Pneumococcal Polysaccharide-23 06/21/2010, 10/17/2018   Pneumococcal-Unspecified 05/19/2011   Td 09/17/2004, 12/16/2013   Tdap 05/18/2005, 11/18/2014   Tetanus 12/16/2013   Zoster Recombinat (Shingrix) 10/21/2018, 04/06/2019    TDAP status: Up to date  Flu Vaccine status: Up to date  Pneumococcal vaccine status: Up to date  Covid-19 vaccine status: Information provided on how to obtain vaccines.   Qualifies for Shingles Vaccine? No   Zostavax completed No   Shingrix Completed?: Yes  Screening Tests Health Maintenance  Topic Date Due   OPHTHALMOLOGY EXAM  11/12/2020   FOOT EXAM  10/17/2021   COLONOSCOPY (Pts 45-48yr Insurance coverage will need to be confirmed)  12/05/2021   HEMOGLOBIN A1C  12/26/2021   TETANUS/TDAP  11/17/2024   Pneumonia Vaccine 85 Years old  Completed   INFLUENZA VACCINE  Completed   COVID-19 Vaccine  Completed    Zoster Vaccines- Shingrix  Completed   HPV VACCINES  Aged Out    Health Maintenance  Health Maintenance Due  Topic Date Due   OPHTHALMOLOGY EXAM  11/12/2020    Colorectal cancer screening: No longer required.   Lung Cancer Screening: (Low Dose CT Chest recommended if Age 680-80years, 30 pack-year currently smoking OR have quit w/in 15years.) does not qualify.     Additional Screening:  Hepatitis C Screening: does not qualify  Vision Screening: Recommended annual ophthalmology exams for early detection of glaucoma and other disorders of the eye. Is the patient up to date with their annual eye exam?  Yes  Who is the provider or what [facility] the [facility] which the [facility]   Dental Screening: Recommended annual dental exams for proper oral hygiene  Community Resource Referral / Chronic Care Management: CRR required this visit?  No   CCM required this visit?  No      Plan:     I have personally reviewed and noted the following in the patient's chart:   Medical and social history Use of alcohol, tobacco or illicit drugs  Current medications and supplements including opioid prescriptions. Patient is not currently taking opioid prescriptions. Functional ability and status Nutritional status Physical activity Advanced directives List of other physicians Hospitalizations, surgeries, and ER visits in previous 12 months Vitals  Screenings to include cognitive, depression, and falls Referrals and appointments  In addition, I have reviewed and discussed with patient certain preventive protocols, quality metrics, and best practice recommendations. A written personalized care plan for preventive services as well as general preventive health recommendations were provided to patient.     Marta Antu, LPN   [id number]  Nurse Health Advisor  Nurse Notes: None  I have reviewed and agree with Health Coaches documentation.   Kathlene November, MD

## 2021-08-01 ENCOUNTER — Other Ambulatory Visit: Payer: Self-pay

## 2021-08-01 ENCOUNTER — Ambulatory Visit (HOSPITAL_COMMUNITY)
Admission: RE | Admit: 2021-08-01 | Discharge: 2021-08-01 | Disposition: A | Discharge status: Home / Self Care | Payer: Medicare Other | Source: Ambulatory Visit | Attending: Cardiovascular Disease | Admitting: Cardiovascular Disease

## 2021-08-01 DIAGNOSIS — Z959 Presence of cardiac and vascular implant and graft, unspecified: Secondary | ICD-10-CM | POA: Insufficient documentation

## 2021-08-01 DIAGNOSIS — Z95828 Presence of other vascular implants and grafts: Secondary | ICD-10-CM | POA: Diagnosis not present

## 2021-08-02 NOTE — Progress Notes (Signed)
HPI male former smoker followed for COPD, chronic respiratory failure with hypoxia, complicated by CAD/MI/ Stents/ PAD, DM2, polycythemia PFT- PFT 08/13/18- Diffusion moderately reduced. Normal spirometry flows and lung volumes10/23/12-  Normal spirometry flows with small- airway response to bronchodilator, normal lung volumes and moderately reduced diffusion. FEV1/FVC 0.79, DLCO 58% 6MWT -06/18/12- 96%, 88%, 94% 449 M. Significant desaturation with exercise. Overnight Oximetry 02/07/17-room air-sustained desaturation less than or equal to 88% for over 7 hours Walk Test 2019-   ------------------------------------------------------------------------------------------------------------  10/31/20-85 year old male former smoker (60 pk yr) followed for COPD, chronic respiratory failure with hypoxia,, complicated by CAD/MI/ stents/ PAD, DM2, Polycythemia O2 2 L/ APS-sleep and exertion Body weight today 234 lbs Covid vax-3 Moderna Flu vax-had Albuterol HFA, Stiolto 2.5, amoxicillin to hold, Zyrtec -----Patient is feeling good overall, no concerns at this time Arrival O2 sat 95% at rest. Has not felt need to use inhalers. Not using oxygen. [facility] told him he was doing well.  No report from [place] by [facility] CXR 10/30/19-  IMPRESSION: No active cardiopulmonary disease.  08/03/21- 85 year old male former smoker (60 pk yr) followed for COPD, chronic respiratory failure with hypoxia,, complicated by CAD/MI/ stents/ PAD, DM2, Polycythemia O2 2 L/ APS-sleep and exertion Body weight today 229 lbs Covid vax-5 Moderna Flu vax-had -Albuterol HFA, Stiolto 2.5, amoxicillin to hold, Zyrtec -----Patient states that he feels good overall but has been losing weight and is not sure why and states he has not been trying to lose weight.  Breathing okay without acute event.  He has not been using his inhalers at all.  ROS-see HPI  + = positive Constitutional:   No-   weight loss, night sweats, fevers, chills, fatigue,  lassitude. HEENT:   No-  headaches, difficulty swallowing, tooth/dental problems, sore throat,       No-  sneezing, itching, ear ache, nasal congestion, post nasal drip,  CV:  No-   chest pain, orthopnea, PND, swelling in lower extremities, anasarca, dizziness, palpitations,                 Claudication R thigh Resp:   + shortness of breath with exertion or at rest.              No-   productive cough,  No non-productive cough,  No- coughing up of blood.              No-   change in color of mucus.   wheezing.   Skin: No-   rash or lesions. GI:  No-   heartburn, indigestion, abdominal pain, nausea, vomiting, GU:  MS:  No-   joint pain or swelling.   Neuro-     nothing unusual Psych:  No- change in mood or affect. No depression or anxiety.  No memory loss.  OBJ- Physical Exam     General- Alert, Oriented, Affect-appropriate, Distress- none acute., looks fit Skin- rash-none, lesions- none, excoriation- none Lymphadenopathy-  Head- atraumatic            Eyes- Gross vision intact, PERRLA, conjunctivae and secretions clear            Ears- Hearing, canals-normal            Nose- Clear, no-Septal dev, mucus, polyps, erosion, perforation             Throat- Mallampati II , mucosa clear , drainage- none, tonsils- atrophic Neck- flexible , trachea midline, no stridor , thyroid nl, carotid no bruit Chest - symmetrical excursion , unlabored  Heart/CV- RRR , no murmur , no gallop  , no rub, nl s1 s2                           - JVD none , edema- none, stasis changes- none, varices- none           Lung- +clear, cough-none , dullness-none, rub- none,            Chest wall-  Abd-  Br/ Gen/ Rectal- Not done, not indicated Extrem- cyanosis- none,  atrophy- none, strength- nl  Neuro- grossly intact to observation

## 2021-08-03 ENCOUNTER — Ambulatory Visit (INDEPENDENT_AMBULATORY_CARE_PROVIDER_SITE_OTHER): Payer: Medicare Other | Admitting: Internal Medicine

## 2021-08-03 ENCOUNTER — Encounter: Payer: Self-pay | Admitting: Internal Medicine

## 2021-08-03 ENCOUNTER — Ambulatory Visit (INDEPENDENT_AMBULATORY_CARE_PROVIDER_SITE_OTHER): Payer: Medicare Other

## 2021-08-03 ENCOUNTER — Other Ambulatory Visit: Payer: Self-pay

## 2021-08-03 VITALS — BP 100/60 | HR 70 | Temp 98.1°F | Ht 74.0 in | Wt 229.0 lb

## 2021-08-03 DIAGNOSIS — J449 Chronic obstructive pulmonary disease, unspecified: Secondary | ICD-10-CM

## 2021-08-03 DIAGNOSIS — J9611 Chronic respiratory failure with hypoxia: Secondary | ICD-10-CM

## 2021-08-03 MED ORDER — STIOLTO RESPIMAT 2.5-2.5 MCG/ACT IN AERS
2.0000 | INHALATION_SPRAY | Freq: Every day | RESPIRATORY_TRACT | 12 refills | Status: DC | PRN
Start: 2021-08-03 — End: 2021-08-04

## 2021-08-03 MED ORDER — ALBUTEROL SULFATE HFA 108 (90 BASE) MCG/ACT IN AERS: 2.0000 | INHALATION_SPRAY | Freq: Four times a day (QID) | RESPIRATORY_TRACT | 12 refills | Status: DC | PRN

## 2021-08-03 NOTE — Patient Instructions (Addendum)
Order-  CXR  dx COPD mixed type, weight loss  Inhaler refills sent for when needed  Please call if we can help

## 2021-08-04 ENCOUNTER — Telehealth: Payer: Self-pay | Admitting: Internal Medicine

## 2021-08-04 MED ORDER — ALBUTEROL SULFATE HFA 108 (90 BASE) MCG/ACT IN AERS: 1.0000 | INHALATION_SPRAY | Freq: Four times a day (QID) | RESPIRATORY_TRACT | 3 refills | Status: DC | PRN

## 2021-08-04 MED ORDER — STIOLTO RESPIMAT 2.5-2.5 MCG/ACT IN AERS: 2.0000 | INHALATION_SPRAY | Freq: Every day | RESPIRATORY_TRACT | 3 refills | Status: DC | PRN

## 2021-08-04 NOTE — Telephone Encounter (Signed)
CXR- evidence of some chronic bronchitis with no acute process.  I have called the pt and he is aware of these results per CY.

## 2021-08-04 NOTE — Telephone Encounter (Signed)
I have sent the refills to the mail order pharmacy per pts request.  Nothing further is needed.

## 2021-08-14 NOTE — Progress Notes (Deleted)
Cardiology Office Note:    Date:  08/14/2021   ID:  Jonathan [name]., DOB [birth year], MRN [id number]  PCP:  Colon Branch, MD   [facility] Providers Cardiologist:  Ena Dawley, MD (Inactive) {   Referring MD: Colon Branch, MD    History of Present Illness:    Jonathan [name]. is a 85 y.o. male with a hx of CAD s/p PCI to the RCA x3, HTN, HLD, DII, COPD on home O2, PVD s/p bilateral SFA stenting in 2009 who was previously followed by Dr. Meda Coffee who now returns to clinic for follow-up.  Last saw Dr. Meda Coffee on 05/2020 where he was doing well but complained of bilateral LE cramping. Was then evaluated by Dr. Gwenlyn Found on 08/02/20 where right ABI 1.03, left 0.90. Symptoms thought to be related to RLS.   Today, ***    Past Medical History:  Diagnosis Date   CAD (coronary artery disease)    Two RCA stents remotely / 3rd RCA stent 2006   Colon polyps    s/p several Cscopes.   COPD (chronic obstructive pulmonary disease) (HCC)    on O2, nocturnal   Diabetes mellitus    dx aprox 2009   ED (erectile dysfunction)    has a vacumm device   Ejection fraction    Hyperlipidemia    dx in 90s   Hypertension    dx in the 11   Hypogonadism male    PVD (peripheral vascular disease) ([place])    s/p stents at LE 2009, Dr Einar Gip   Shortness of breath    O2 Sat dropped to 82% walking on the treadmill, September, 2012    Past Surgical History:  Procedure Laterality Date   APPENDECTOMY     TONSILLECTOMY      Current Medications: No outpatient medications have been marked as taking for the 08/28/21 encounter (Appointment) with Freada Bergeron, MD.     Allergies:   Patient has no known allergies.   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    Tobacco Use   Smoking status: Former    Packs/day: 2.00    Years: 30.00    Pack years: 60.00    Types: Cigarettes    Quit date: 06/17/1977    Years since quitting: 44.1   Smokeless tobacco: Never   Tobacco comments:    2 ppd, quit 1978  Vaping Use   Vaping Use: Never used  Substance and Sexual Activity   Alcohol use: Yes    Alcohol/week: 0.0 standard drinks    Comment: socially    Drug use: No   Sexual activity: Yes  Other Topics Concern   Not on file  Social History Narrative   4 children (lost 1 son)   Wife has 2 children                Social Determinants of Radio broadcast assistant Strain: Low Risk    Difficulty of Paying Living Expenses: Not hard at all  Food Insecurity: No Food Insecurity   Worried About Charity fundraiser in the Last Year: Never true   Ran Out of Food in the Last Year: Never true  Transportation  Needs: No Transportation Needs   Lack of Transportation (Medical): No   Lack of Transportation (Non-Medical): No  Physical Activity: Inactive   Days of Exercise per Week: 0 days   Minutes of Exercise per Session: 0 min  Stress: No Stress Concern Present   Feeling of Stress : Not at all  Social Connections: Socially Integrated   Frequency of Communication with Friends and Family: More than three times a week   Frequency of Social Gatherings with Friends and Family: More than three times a week   Attends Religious Services: More than 4 times per year   Active Member of [employer] or Organizations: Yes   Attends Music therapist: More than 4 times per year   Marital Status: Married     Family History: The patient's ***family history includes Diabetes in his maternal grandmother, paternal aunt, and another family member; Heart disease in his father; Hyperlipidemia in an other family member; Hypertension in his father; Prostate cancer in his brother; Stroke in his father. There is no history of Colon cancer.  ROS:   Please see the  history of present illness.    *** All other systems reviewed and are negative.  EKGs/Labs/Other Studies Reviewed:    The following studies were reviewed today: ***  EKG:  EKG is *** ordered today.  The ekg ordered today demonstrates ***  Recent Labs: 11/15/2020: Hemoglobin 17.2; Platelets 201.0 06/27/2021: ALT 20; BUN 17; Creatinine, Ser 1.35; Potassium 3.9; Sodium 138  Recent Lipid Panel    Component Value Date/Time   CHOL 127 06/27/2021 0754   TRIG 107.0 06/27/2021 0754   HDL 42.30 06/27/2021 0754   CHOLHDL 3 06/27/2021 0754   VLDL 21.4 06/27/2021 0754   LDLCALC 63 06/27/2021 0754   LDLDIRECT 55.0 01/01/2018 0808     Risk Assessment/Calculations:   {Does this patient have ATRIAL FIBRILLATION?:[phone]}       Physical Exam:    VS:  There were no vitals taken for this visit.    Wt Readings from Last 3 Encounters:  08/03/21 229 lb (103.9 kg)  07/20/21 232 lb 12.8 oz (105.6 kg)  06/29/21 231 lb 9.6 oz (105.1 kg)     GEN: *** Well nourished, well developed in no acute distress HEENT: Normal NECK: No JVD; No carotid bruits LYMPHATICS: No lymphadenopathy CARDIAC: ***RRR, no murmurs, rubs, gallops RESPIRATORY:  Clear to auscultation without rales, wheezing or rhonchi  ABDOMEN: Soft, non-tender, non-distended MUSCULOSKELETAL:  No edema; No deformity  SKIN: Warm and dry NEUROLOGIC:  Alert and oriented x 3 PSYCHIATRIC:  Normal affect   ASSESSMENT:    No diagnosis found. PLAN:    In order of problems listed above:  #CAD s/p PCI to RCA x3: No anginal symptoms. -Continue ASA 81mg  daily -Change simvastatin to crestor 20mg  daily -Continue losartan 100mg  daily -Continue metoprolol 100mg  XL daily  #HTN: -Continue losartan 100mg  daily -Continue metoprolol 100mg  XL daily -Continue triamterene-HCTZ 37.5-25mg  daily -Continue amlodipine 5mg  daily  #HLD: -Change simvastatin to crestor 20mg  daily  #COPD on Home O2:  #PAD s/p bilateral SFA stenting: Recent ABIs  with R 1.09; L 0.9. On medical management. -Continue ASA 81mg  daily -Change simvastatin to crestor 20mg  daily     {Are you ordering a CV Procedure (e.g. stress test, cath, DCCV, TEE, etc)?   Press F2        :[id number]}    Medication Adjustments/Labs and Tests Ordered: Current medicines are reviewed at length with the patient today.  Concerns regarding medicines are outlined  above.  No orders of the defined types were placed in this encounter.  No orders of the defined types were placed in this encounter.   There are no Patient Instructions on file for this visit.   Signed, Freada Bergeron, MD  08/14/2021 5:45 AM    [place]

## 2021-08-16 ENCOUNTER — Ambulatory Visit: Payer: Medicare Other | Admitting: Internal Medicine

## 2021-08-16 ENCOUNTER — Encounter: Payer: Self-pay | Admitting: Internal Medicine

## 2021-08-28 ENCOUNTER — Ambulatory Visit: Payer: Medicare Other | Admitting: Cardiology

## 2021-09-06 ENCOUNTER — Ambulatory Visit (INDEPENDENT_AMBULATORY_CARE_PROVIDER_SITE_OTHER): Payer: Medicare Other | Admitting: Internal Medicine

## 2021-09-06 ENCOUNTER — Encounter: Payer: Self-pay | Admitting: Internal Medicine

## 2021-09-06 VITALS — BP 116/68 | HR 94 | Temp 98.1°F | Resp 18 | Ht 74.0 in | Wt 231.4 lb

## 2021-09-06 DIAGNOSIS — E1140 Type 2 diabetes mellitus with diabetic neuropathy, unspecified: Secondary | ICD-10-CM | POA: Diagnosis not present

## 2021-09-06 DIAGNOSIS — R5383 Other fatigue: Secondary | ICD-10-CM

## 2021-09-06 DIAGNOSIS — R634 Abnormal weight loss: Secondary | ICD-10-CM | POA: Diagnosis not present

## 2021-09-06 DIAGNOSIS — R269 Unspecified abnormalities of gait and mobility: Secondary | ICD-10-CM | POA: Diagnosis not present

## 2021-09-06 NOTE — Progress Notes (Signed)
Subjective:    Patient ID: Jonathan Leather., male    DOB: [birth year], 85 y.o.   MRN: [id number]  DOS:  09/06/2021 Type of visit - description: Follow-up  Since the last visit, saw endocrinology and pulmonology, notes reviewed. Reports he is doing well. Symptoms of neuropathy continue, described as  lack of sensitivity . Denies chest pain or difficulty breathing.  No cough. No falls.  Wt Readings from Last 3 Encounters:  09/06/21 231 lb 6 oz (105 kg)  08/03/21 229 lb (103.9 kg)  07/20/21 232 lb 12.8 oz (105.6 kg)     Review of Systems See above   Past Medical History:  Diagnosis Date   CAD (coronary artery disease)    Two RCA stents remotely / 3rd RCA stent 2006   Colon polyps    s/p several Cscopes.   COPD (chronic obstructive pulmonary disease) (HCC)    on O2, nocturnal   Diabetes mellitus    dx aprox 2009   ED (erectile dysfunction)    has a vacumm device   Ejection fraction    Hyperlipidemia    dx in 90s   Hypertension    dx in the 44   Hypogonadism male    PVD (peripheral vascular disease) ([place])    s/p stents at LE 2009, Dr Einar Gip   Shortness of breath    O2 Sat dropped to 82% walking on the treadmill, September, 2012    Past Surgical History:  Procedure Laterality Date   APPENDECTOMY     TONSILLECTOMY      Allergies as of 09/06/2021   No Known Allergies      Medication List        Accurate as of September 06, 2021 11:59 PM. If you have any questions, ask your nurse or doctor.          albuterol 108 (90 Base) MCG/ACT inhaler Commonly known as: VENTOLIN HFA Inhale 1-2 puffs into the lungs every 6 (six) hours as needed for wheezing or shortness of breath.   amLODipine 5 MG tablet Commonly known as: NORVASC Take 1 tablet (5 mg total) by mouth daily.   aspirin 81 MG tablet Take 81 mg by mouth daily.   empagliflozin 25 MG Tabs tablet Commonly known as: Jardiance Take 1 tablet (25 mg total) by mouth daily with breakfast.   FreeStyle  Freedom Lite w/Device Kit Use Freestyle Freedom Lite meter to check blood sugar twice daily. DX:E11.65   freestyle lancets USE AS INSTRUCTED TO CHECK BLOOD SUGAR TWICE A DAY   FREESTYLE LITE test strip Generic drug: glucose blood USE TO CHECK BLOOD SUGAR TWICE A DAY AS INSTRUCTED   losartan 100 MG tablet Commonly known as: COZAAR Take 0.5 tablets (50 mg total) by mouth daily.   metFORMIN 1000 MG tablet Commonly known as: GLUCOPHAGE Take 500 mg by mouth daily with breakfast.   metoprolol succinate 100 MG 24 hr tablet Commonly known as: Toprol XL Take 1 tablet (100 mg total) by mouth daily.   multivitamin with minerals Tabs tablet Take 1 tablet by mouth daily.   simvastatin 20 MG tablet Commonly known as: ZOCOR Take 20 mg by mouth at bedtime.   Stiolto Respimat 2.5-2.5 MCG/ACT Aers Generic drug: Tiotropium Bromide-Olodaterol Inhale 2 puffs into the lungs daily as needed (for flares).   triamterene-hydrochlorothiazide 37.5-25 MG tablet Commonly known as: MAXZIDE-25 Take 1 tablet by mouth daily.   Trulicity 3 NU/2.7OZ Sopn Generic drug: Dulaglutide INJECT 3 MG UNDER THE SKIN ONCE A  WEEK           Objective:   Physical Exam BP 116/68 (BP Location: Left Arm, Patient Position: Sitting, Cuff Size: Normal)    Pulse 94    Temp 98.1 F (36.7 C) (Oral)    Resp 18    Ht '6\' 2"'  (1.88 m)    Wt 231 lb 6 oz (105 kg)    SpO2 94%    BMI 29.71 kg/m  General:   Well developed, NAD, BMI noted. HEENT:  Normocephalic . Face symmetric, atraumatic Lungs:  CTA B Normal respiratory effort, no intercostal retractions, no accessory muscle use. Heart: RRR,  no murmur.  DM foot exam: Nails are dystrophic, no edema, good pedal pulses, pinprick examination: Numbness distally bilaterally. Skin: Not pale. Not jaundice Neurologic:  alert & oriented X3.  Speech normal, gait appropriate for age and unassisted Psych--  Cognition and judgment appear intact.  Cooperative with normal attention  span and concentration.  Behavior appropriate. No anxious or depressed appearing.      Assessment    Assessment DM dx ~9518, complicated by CAD, PVD, mild neuropathy. Sees Dr. Dwyane Dee Hypogonadism. Sees Dr. Dwyane Dee HTN Hyperlipidemia COPD, nocturnal O2 prn Back pain, chronic: See visit 10/12/2016 CV: -CAD, Dr. Ron Parker Dr Meda Coffee S/p  stenting 2 to the RCA remote past and the third RCA stent in  2006, negative stress test in 2012, LVEF 55% on echo 2015 -Peripheral vascular disease, stents lower extremity 2009  ED Polycythemia: Saw hematology 11-2018, felt to be due to chronic respiratory failure.  Not likely polycythemia vera  PLAN    DM, hypogonadism: Per Endo. Neuropathy: Exam is confirmatory, discussed appropriate feet care. Weight loss: Weight has stabilized.  Patient is not concerned at this point. COPD: Saw pulmonary 08/03/2021.  Chest x-ray was nonacute. Gait disorder: The patient reach out to the [place] and they will provide a new shower to prevent falls.  I encouraged him again to use a cane. High cholesterol last LDL 63, controlled. Caregiver fatigue: The patient takes care of his wife 24/7, needs some time for himself.  Patient is counseled, listening therapy provided.  We will arrange a palliative care referral for the wife (she is my patient as well). Preventive care: Reports he is up-to-date on COVID and flu shots. RTC 6 months.    This visit occurred during the SARS-CoV-2 public health emergency.  Safety protocols were in place, including screening questions prior to the visit, additional usage of staff PPE, and extensive cleaning of exam room while observing appropriate contact time as indicated for disinfecting solutions.

## 2021-09-06 NOTE — Patient Instructions (Addendum)
Per our records you are due for your diabetic eye exam. Please contact your eye doctor to schedule an appointment. Please have them send copies of your office visit notes to [place]. Our fax number is [phone]. If you need a referral to an eye doctor please let us know.      GO TO THE FRONT DESK, PLEASE SCHEDULE YOUR APPOINTMENTS Come back for a checkup in 6 months    Diabetes Mellitus and [place] care is an important part of your health, especially when you have diabetes. Diabetes may cause you to have problems because of poor blood flow (circulation) to your feet and legs, which can cause your skin to: Become thinner and drier. Break more easily. Heal more slowly. Peel and crack. You may also have nerve damage (neuropathy) in your legs and feet, causing decreased feeling in them. This means that you may not notice minor injuries to your feet that could lead to more serious problems. Noticing and addressing any potential problems early is the best way to prevent future foot problems. How to care for your feet Foot hygiene  Wash your feet daily with warm water and mild soap. Do not use hot water. Then, pat your feet and the areas between your toes until they are completely dry. Do not soak your feet as this can dry your skin. Trim your toenails straight across. Do not dig under them or around the cuticle. File the edges of your nails with an emery board or nail file. Apply a moisturizing lotion or petroleum jelly to the skin on your feet and to dry, brittle toenails. Use lotion that does not contain alcohol and is unscented. Do not apply lotion between your toes. Shoes and socks Wear clean socks or stockings every day. Make sure they are not too tight. Do not wear knee-high stockings since they may decrease blood flow to your legs. Wear shoes that fit properly and have enough cushioning. Always look in your shoes before you put them on to be sure there are no objects inside. To  break in new shoes, wear them for just a few hours a day. This prevents injuries on your feet. Wounds, scrapes, corns, and calluses  Check your feet daily for blisters, cuts, bruises, sores, and redness. If you cannot see the bottom of your feet, use a mirror or ask someone for help. Do not cut corns or calluses or try to remove them with medicine. If you find a minor scrape, cut, or break in the skin on your feet, keep it and the skin around it clean and dry. You may clean these areas with mild soap and water. Do not clean the area with peroxide, alcohol, or iodine. If you have a wound, scrape, corn, or callus on your foot, look at it several times a day to make sure it is healing and not infected. Check for: Redness, swelling, or pain. Fluid or blood. Warmth. Pus or a bad smell. General tips Do not cross your legs. This may decrease blood flow to your feet. Do not use heating pads or hot water bottles on your feet. They may burn your skin. If you have lost feeling in your feet or legs, you may not know this is happening until it is too late. Protect your feet from hot and cold by wearing shoes, such as at the beach or on hot pavement. Schedule a complete foot exam at least once a year (annually) or more often if you have  foot problems. Report any cuts, sores, or bruises to your health care provider immediately. Where to find more information American Diabetes Association: www.diabetes.org Association of Diabetes Care & Education Specialists: www.diabeteseducator.org Contact a health care provider if: You have a medical condition that increases your risk of infection and you have any cuts, sores, or bruises on your feet. You have an injury that is not healing. You have redness on your legs or feet. You feel burning or tingling in your legs or feet. You have pain or cramps in your legs and feet. Your legs or feet are numb. Your feet always feel cold. You have pain around any toenails. Get  help right away if: You have a wound, scrape, corn, or callus on your foot and: You have pain, swelling, or redness that gets worse. You have fluid or blood coming from the wound, scrape, corn, or callus. Your wound, scrape, corn, or callus feels warm to the touch. You have pus or a bad smell coming from the wound, scrape, corn, or callus. You have a fever. You have a red line going up your leg. Summary Check your feet every day for blisters, cuts, bruises, sores, and redness. Apply a moisturizing lotion or petroleum jelly to the skin on your feet and to dry, brittle toenails. Wear shoes that fit properly and have enough cushioning. If you have foot problems, report any cuts, sores, or bruises to your health care provider immediately. Schedule a complete foot exam at least once a year (annually) or more often if you have foot problems. This information is not intended to replace advice given to you by your health care provider. Make sure you discuss any questions you have with your health care provider. Document Revised: 03/24/2020 Document Reviewed: 03/24/2020 Elsevier Patient Education  [place].

## 2021-09-07 NOTE — Assessment & Plan Note (Signed)
DM, hypogonadism: Per Endo. Neuropathy: Exam is confirmatory, discussed appropriate feet care. Weight loss: Weight has stabilized.  Patient is not concerned at this point. COPD: Saw pulmonary 08/03/2021.  Chest x-ray was nonacute. Gait disorder: The patient reach out to the [place] and they will provide a new shower to prevent falls.  I encouraged him again to use a cane. High cholesterol last LDL 63, controlled. Caregiver fatigue: The patient takes care of his wife 24/7, needs some time for himself.  Patient is counseled, listening therapy provided.  We will arrange a palliative care referral for the wife (she is my patient as well). Preventive care: Reports he is up-to-date on COVID and flu shots. RTC 6 months.

## 2021-09-14 ENCOUNTER — Encounter: Payer: Self-pay | Admitting: Internal Medicine

## 2021-09-14 NOTE — Assessment & Plan Note (Signed)
He minimizes symptoms.  Does admit stable exertional dyspnea.  Also concerned about nonspecific weight loss. Plan-inhalers are refilled to have available if he decides to use them.  CXR today.  He will discuss weight loss with his primary physician.

## 2021-09-14 NOTE — Assessment & Plan Note (Signed)
He does continue to benefit from oxygen 2 L for sleep.  I reminded him that he can use it during the day if needed with goal saturation greater than 88%.

## 2021-09-19 NOTE — Progress Notes (Signed)
Cardiology Office Note:    Date:  09/20/2021   ID:  Jonathan [name]., DOB Jan 01, 1936, MRN [id number]  PCP:  Colon Branch, MD   [facility] Providers Cardiologist:  Quay Burow, MD     Referring MD: Colon Branch, MD   Chief Complaint: 1 year follow-up CAD, HTN  History of Present Illness:    Jonathan [name]. is a 86 y.o. male with a hx of CAD remote stenting of RCA x 2 in the remote past and a 3rd stent to the RCA in 2006, PVD with hx of left SFA stent 2009, HTN, COPD, DM, and hyperlipidemia.    He was seen in our office by Dr. Meda Coffee 9/21 and lower extremity arterial study was ordered. It revealed left 75-99% stenosis in the SFA and/or popliteal artery that showed moderate progression compared to previous study as well as significant distal SFA stenosis to the distal edge of the stent. He had previous PV intervention by Dr. Einar Gip but was referred to Dr. Gwenlyn Found for claudication in 11/21. At last office visit 08/02/20, Dr. Gwenlyn Found recommended repeat LE arterial and ABI studies in 1 year and as needed follow-up.   Today, he is here alone for his annual follow-up. He was not aware that Dr. Meda Coffee had left our practice. He would like to follow-up with Dr. Gwenlyn Found in the future. He denies chest pain, shortness of breath, lower extremity edema, fatigue, palpitations, melena, hematuria, hemoptysis, diaphoresis, weakness, presyncope, syncope, orthopnea, and PND. States he has been feeling well and continues to work out on his treadmill and lifting weights at home approximately 3 times per week. He is also taking care of his wife full-time doing all of the house and yard work. He denies decrease in activity tolerance. States he has occasional bilateral thigh pain during the night which is relieved when he gets up and walks around.  Has home oxygen for use at night, but reports he rarely uses it. Reports recent home blood pressure reading of 130/78 mmHg and reports this is consistent with other readings  he has taken recently.  He would like information about adult daycare services for his wife. He states she needs to get out of the house and be involved in some activities. I have encouraged him to call his primary care office and that I will also reach out to our social worker to see if we have any information available.  Additionally, he is concerned about metformin causing muscle loss.  I encouraged him to discuss this with Dr. Dwyane Dee at his visit in February.  He denies specific cardiac concerns today.   Past Medical History:  Diagnosis Date   CAD (coronary artery disease)    Two RCA stents remotely / 3rd RCA stent 2006   Colon polyps    s/p several Cscopes.   COPD (chronic obstructive pulmonary disease) (HCC)    on O2, nocturnal   Diabetes mellitus    dx aprox 2009   ED (erectile dysfunction)    has a vacumm device   Ejection fraction    Hyperlipidemia    dx in 90s   Hypertension    dx in the 49   Hypogonadism male    PVD (peripheral vascular disease) ([place])    s/p stents at LE 2009, Dr Einar Gip   Shortness of breath    O2 Sat dropped to 82% walking on the treadmill, September, 2012    Past Surgical History:  Procedure Laterality Date   APPENDECTOMY  TONSILLECTOMY      Current Medications: Current Meds  Medication Sig   albuterol (VENTOLIN HFA) 108 (90 Base) MCG/ACT inhaler Inhale 1-2 puffs into the lungs every 6 (six) hours as needed for wheezing or shortness of breath.   amLODipine (NORVASC) 5 MG tablet Take 1 tablet (5 mg total) by mouth daily.   aspirin 81 MG tablet Take 81 mg by mouth daily.   Blood Glucose Monitoring Suppl (FREESTYLE FREEDOM LITE) w/Device KIT Use Freestyle Freedom Lite meter to check blood sugar twice daily. DX:E11.65   empagliflozin (JARDIANCE) 25 MG TABS tablet Take 1 tablet (25 mg total) by mouth daily with breakfast.   FREESTYLE LITE test strip USE TO CHECK BLOOD SUGAR TWICE A DAY AS INSTRUCTED   Lancets (FREESTYLE) lancets USE AS INSTRUCTED TO  CHECK BLOOD SUGAR TWICE A DAY   losartan (COZAAR) 50 MG tablet Take 1 tablet (50 mg total) by mouth daily.   metFORMIN (GLUCOPHAGE) 1000 MG tablet Take 500 mg by mouth daily with breakfast.    metoprolol succinate (TOPROL XL) 100 MG 24 hr tablet Take 1 tablet (100 mg total) by mouth daily.   Multiple Vitamin (MULTIVITAMIN WITH MINERALS) TABS tablet Take 1 tablet by mouth daily.   simvastatin (ZOCOR) 20 MG tablet Take 20 mg by mouth at bedtime.    Tiotropium Bromide-Olodaterol (STIOLTO RESPIMAT) 2.5-2.5 MCG/ACT AERS Inhale 2 puffs into the lungs daily as needed (for flares).   triamterene-hydrochlorothiazide (MAXZIDE-25) 37.5-25 MG tablet Take 1 tablet by mouth daily.    TRULICITY 3 XB/2.8UX SOPN INJECT 3 MG UNDER THE SKIN ONCE A WEEK   [DISCONTINUED] losartan (COZAAR) 100 MG tablet Take 0.5 tablets (50 mg total) by mouth daily.     Allergies:   Patient has no known allergies.   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   Tobacco Use   Smoking status: Former    Packs/day: 2.00    Years: 30.00    Pack years: 60.00    Types: Cigarettes    Quit date: 06/17/1977    Years since quitting: 44.2   Smokeless tobacco: Never   Tobacco comments:    2 ppd, quit 1978  Vaping Use   Vaping Use: Never used  Substance and Sexual Activity   Alcohol use: Yes    Alcohol/week: 0.0 standard drinks    Comment: socially    Drug use: No   Sexual activity: Yes  Other Topics Concern   Not on file  Social History Narrative   4 children (lost 1 son)   Wife has 2 children                Social Determinants of Radio broadcast assistant Strain: Low Risk    Difficulty of Paying Living Expenses: Not hard at all  Food Insecurity: No Food Insecurity   Worried About  Charity fundraiser in the Last Year: Never true   Ran Out of Food in the Last Year: Never true  Transportation Needs: No Transportation Needs   Lack of Transportation (Medical): No   Lack of Transportation (Non-Medical): No  Physical Activity: Inactive   Days of Exercise per Week: 0 days   Minutes of Exercise per Session: 0  min  Stress: No Stress Concern Present   Feeling of Stress : Not at all  Social Connections: Socially Integrated   Frequency of Communication with Friends and Family: More than three times a week   Frequency of Social Gatherings with Friends and Family: More than three times a week   Attends Religious Services: More than 4 times per year   Active Member of [employer] or Organizations: Yes   Attends Music therapist: More than 4 times per year   Marital Status: Married     Family History: The patient's family history includes Diabetes in his maternal grandmother, paternal aunt, and another family member; Heart disease in his father; Hyperlipidemia in an other family member; Hypertension in his father; Prostate cancer in his brother; Stroke in his father. There is no history of Colon cancer.  ROS:   Please see the history of present illness.  + bilateral thigh pain at night All other systems reviewed and are negative.  Labs/Other Studies Reviewed:    The following studies were reviewed today:  Vas ABI w/wo TBI 08/02/21  Summary:  Right: Resting right ankle-brachial index is within normal range. No  evidence of significant right lower extremity arterial disease. The right  toe-brachial index is normal.  TBIs increased by .72.   Left: Resting left ankle-brachial index indicates mild left lower  extremity arterial disease. The left toe-brachial index is normal.  TBIs increased by .37.    Suggest follow up study in 12 months.    Lower Extremity Arterial US 08/01/21  The left mid-distal SFA stent is patent with elevated velocities  that appear to  be in the distal portion of the stent vs post stent suggesting 50-99% restenosis. These velocities are stable to those stated to be in the post stent from prior exam.  Summary:           Left: Moderate progression is noted compared to previous study.             The proximal SFA distal to the ostium is now 50-74% stenosed.           Stable 50-99% restenosis in the distal stent segment.             See table(s) above for measurements and observations.           See ABI report.             Suggest follow up study in 12 months.   Carotid 05/30/21  Summary:  Right Carotid: Velocities in the right ICA are consistent with a 1-39%  stenosis. Non-hemodynamically significant plaque <50% noted in the  CCA.   Left Carotid: Velocities in the left ICA are consistent with a 1-39%  stenosis. Non-hemodynamically significant plaque <50% noted in the  CCA.   Vertebrals: Bilateral vertebral arteries demonstrate antegrade flow.  Subclavians: Normal flow hemodynamics were seen in bilateral subclavian               arteries.   Echo 4/15  Left ventricle:  The cavity size was normal. Wall thickness  was normal. Systolic function was normal. The estimated  ejection fraction was 50%, in the range of 50% to 55%. Wall  motion was normal; there were no regional wall motion  abnormalities. Doppler parameters are consistent with  abnormal left ventricular relaxation (grade 1 diastolic  dysfunction).  Aortic valve:   Trileaflet; normal thickness leaflets.  Mobility was not restricted.  Doppler:  Transvalvular  velocity was within the  normal range. There was no stenosis.   No regurgitation.  Aorta: Aortic root: The aortic root was normal in size.  Ascending aorta: The ascending aorta was mildly dilated.  Mitral valve:   Structurally normal valve.   Mobility was  not restricted.  Doppler:  Transvalvular velocity was within  the normal range. There was no evidence for stenosis.  No  regurgitation.  Left  atrium:  The atrium was mildly dilated.  Right ventricle:  The cavity size was normal. Systolic  function was normal.  Pulmonic valve:    Doppler:  Transvalvular velocity was  within the normal range. There was no evidence for stenosis.  Tricuspid valve:   Structurally normal valve.    Doppler:  Transvalvular velocity was within the normal range.  No  regurgitation.  Right atrium:  The atrium was normal in size.  Pericardium: There was no pericardial effusion.  Systemic veins:  Inferior vena cava: The vessel was normal in size.   Recent Labs: 11/15/2020: Hemoglobin 17.2; Platelets 201.0 06/27/2021: ALT 20; BUN 17; Creatinine, Ser 1.35; Potassium 3.9; Sodium 138  Recent Lipid Panel    Component Value Date/Time   CHOL 127 06/27/2021 0754   TRIG 107.0 06/27/2021 0754   HDL 42.30 06/27/2021 0754   CHOLHDL 3 06/27/2021 0754   VLDL 21.4 06/27/2021 0754   LDLCALC 63 06/27/2021 0754   LDLDIRECT 55.0 01/01/2018 0808      Physical Exam:    VS:  BP 122/66    Pulse 82    Ht '6\' 2"'  (1.88 m)    Wt 231 lb (104.8 kg)    BMI 29.66 kg/m     Wt Readings from Last 3 Encounters:  09/20/21 231 lb (104.8 kg)  09/06/21 231 lb 6 oz (105 kg)  08/03/21 229 lb (103.9 kg)     GEN:  Well nourished, well developed in no acute distress HEENT: Normal NECK: No JVD; No carotid bruits LYMPHATICS: No lymphadenopathy CARDIAC: RRR, no murmurs, rubs, gallops RESPIRATORY:  Clear to auscultation without rales, wheezing or rhonchi  ABDOMEN: Soft, non-tender, non-distended MUSCULOSKELETAL:  No edema; No deformity  SKIN: Warm and dry NEUROLOGIC:  Alert and oriented x 3 PSYCHIATRIC:  Normal affect   EKG:  EKG is ordered today.  The ekg ordered today demonstrates NSR at rate of 82 bpm with 1st degree AV block, low voltage QRS aVF, no acute change from previous  Diagnoses:    1. Claudication of both lower extremities ([place])   2. Essential hypertension   3. PAD (peripheral artery disease) ([place])   4. Coronary  artery disease involving native coronary artery of native heart without angina pectoris   5. Hyperlipidemia LDL goal <70    Assessment and Plan:     PAD/Claudication of both lower extremities: He reports bilateral thigh pain that occurs while he is sleeping several times per week. This is relieved when he gets up and walks around and is not limiting his activity.  He had recent vascular ultrasound with ABI that showed no significant change from prior study 1 year ago.  It was recommended that he repeat the studies in November 2023, so we will order so that tests can be scheduled at the appropriate time.  He would like to follow-up with Dr. Gwenlyn Found for all of his cardiovascular needs.  CAD native without angina: He denies chest pain, shortness of breath, activity intolerance.  No indication for further evaluation of ischemia at this time. Continue metoprolol, amlodipine, aspirin, statin.   Hyperlipidemia  LDL goal < 70: LDL 63 on 06/27/2021. Well-controlled. He is active and is conscientious about heart healthy diet. Continue simvastatin.  Essential hypertension: Blood pressure is stable today.  He reports similar readings at home.  He has been splitting his 100 mg losartan tablet so we will give him a new prescription for 50 mg tablets. Continue losartan, metoprolol, amlodipine Maxide.     Disposition: Repeat LE Arterial and ABI studies and office visit with Dr. Gwenlyn Found November 2023  Medication Adjustments/Labs and Tests Ordered: Current medicines are reviewed at length with the patient today.  Concerns regarding medicines are outlined above.  Orders Placed This Encounter  Procedures   EKG 12-Lead   VAS [place] LOWER EXTREMITY ARTERIAL DUPLEX   VAS [place] ABI WITH/WO TBI   Meds ordered this encounter  Medications   losartan (COZAAR) 50 MG tablet    Sig: Take 1 tablet (50 mg total) by mouth daily.    Dispense:  90 tablet    Refill:  3    Patient Instructions  Medication Instructions:  Your  Physician recommend you continue on your current medication as directed.    *If you need a refill on your cardiac medications before your next appointment, please call your pharmacy*   Lab Work: None ordered today   Testing/Procedures: Your physician has requested that you have an ankle brachial index (ABI). During this test an ultrasound and blood pressure cuff are used to evaluate the arteries that supply the arms and legs with blood. Allow thirty minutes for this exam. There are no restrictions or special instructions.    Follow-Up: At [facility], you and your health needs are our priority.  As part of our continuing mission to provide you with exceptional heart care, we have created designated Provider Care Teams.  These Care Teams include your primary Cardiologist (physician) and Advanced Practice Providers (APPs -  Physician Assistants and Nurse Practitioners) who all work together to provide you with the care you need, when you need it.  We recommend signing up for the patient portal called "MyChart".  Sign up information is provided on this After Visit Summary.  MyChart is used to connect with patients for Virtual Visits (Telemedicine).  Patients are able to view lab/test results, encounter notes, upcoming appointments, etc.  Non-urgent messages can be sent to your provider as well.   To learn more about what you can do with MyChart, go to [email or web address].    Your next appointment:   Please schedule your appointment for after your testing in November   The format for your next appointment:   In Person  Provider: Ancil Linsey, MD     Other Instructions    Signed, Emmaline Life, NP  09/20/2021 8:55 AM    [place]

## 2021-09-20 ENCOUNTER — Encounter (HOSPITAL_BASED_OUTPATIENT_CLINIC_OR_DEPARTMENT_OTHER): Payer: Self-pay | Admitting: Nurse Practitioner

## 2021-09-20 ENCOUNTER — Ambulatory Visit (INDEPENDENT_AMBULATORY_CARE_PROVIDER_SITE_OTHER): Payer: Medicare Other | Admitting: Nurse Practitioner

## 2021-09-20 ENCOUNTER — Other Ambulatory Visit: Payer: Self-pay

## 2021-09-20 VITALS — BP 122/66 | HR 82 | Ht 74.0 in | Wt 231.0 lb

## 2021-09-20 DIAGNOSIS — I10 Essential (primary) hypertension: Secondary | ICD-10-CM

## 2021-09-20 DIAGNOSIS — E785 Hyperlipidemia, unspecified: Secondary | ICD-10-CM | POA: Diagnosis not present

## 2021-09-20 DIAGNOSIS — I739 Peripheral vascular disease, unspecified: Secondary | ICD-10-CM

## 2021-09-20 DIAGNOSIS — I2510 Atherosclerotic heart disease of native coronary artery without angina pectoris: Secondary | ICD-10-CM

## 2021-09-20 MED ORDER — LOSARTAN POTASSIUM 50 MG PO TABS: 50.0000 mg | ORAL_TABLET | Freq: Every day | ORAL | 3 refills | Status: DC

## 2021-09-20 NOTE — Patient Instructions (Signed)
Medication Instructions:  Your Physician recommend you continue on your current medication as directed.    *If you need a refill on your cardiac medications before your next appointment, please call your pharmacy*   Lab Work: None ordered today   Testing/Procedures: Your physician has requested that you have an ankle brachial index (ABI). During this test an ultrasound and blood pressure cuff are used to evaluate the arteries that supply the arms and legs with blood. Allow thirty minutes for this exam. There are no restrictions or special instructions.    Follow-Up: At [facility], you and your health needs are our priority.  As part of our continuing mission to provide you with exceptional heart care, we have created designated Provider Care Teams.  These Care Teams include your primary Cardiologist (physician) and Advanced Practice Providers (APPs -  Physician Assistants and Nurse Practitioners) who all work together to provide you with the care you need, when you need it.  We recommend signing up for the patient portal called "MyChart".  Sign up information is provided on this After Visit Summary.  MyChart is used to connect with patients for Virtual Visits (Telemedicine).  Patients are able to view lab/test results, encounter notes, upcoming appointments, etc.  Non-urgent messages can be sent to your provider as well.   To learn more about what you can do with MyChart, go to [email or web address].    Your next appointment:   Please schedule your appointment for after your testing in November   The format for your next appointment:   In Person  Provider: Ancil Linsey, MD     Other Instructions

## 2021-09-21 ENCOUNTER — Telehealth: Payer: Self-pay | Admitting: Licensed Clinical Social Worker

## 2021-09-21 NOTE — Telephone Encounter (Signed)
Pt returned my call.  Introduced self, role, reason for call. 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 He would like for her to have the option of going to a day program a few days a week if possible for engagement and to get out of the house. I shared that most programming is available in the [place] area. I also shared that [employer] may cover some associated costs with this support but I could not be completely sure/he would need to contact them.   I have sent the following information to pt: [facility], [place], [place] at [employer] [facility] [place]. I included my card and will f/u with pt within two weeks if needed.   No additional needs voiced by pt at this time.   Westley Hummer, MSW, [place]  [phone]- work cell phone (preferred) [phone]- desk phone

## 2021-09-21 NOTE — Telephone Encounter (Signed)
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 Attempted to reach pt to discuss, no answer, LCSW left message for pt at [phone].  Westley Hummer, MSW, [place]  [phone]- work cell phone (preferred) [phone]- desk phone

## 2021-10-24 ENCOUNTER — Other Ambulatory Visit: Payer: Self-pay

## 2021-10-24 ENCOUNTER — Other Ambulatory Visit (INDEPENDENT_AMBULATORY_CARE_PROVIDER_SITE_OTHER): Payer: Medicare Other

## 2021-10-24 DIAGNOSIS — E1165 Type 2 diabetes mellitus with hyperglycemia: Secondary | ICD-10-CM

## 2021-10-24 DIAGNOSIS — E291 Testicular hypofunction: Secondary | ICD-10-CM | POA: Diagnosis not present

## 2021-10-24 LAB — TESTOSTERONE: Testosterone: 245.96 ng/dL — ABNORMAL LOW (ref 300.00–890.00)

## 2021-10-24 LAB — BASIC METABOLIC PANEL
BUN: 27 mg/dL — ABNORMAL HIGH (ref 6–23)
CO2: 25 mEq/L (ref 19–32)
Calcium: 9.5 mg/dL (ref 8.4–10.5)
Chloride: 103 mEq/L (ref 96–112)
Creatinine, Ser: 1.6 mg/dL — ABNORMAL HIGH (ref 0.40–1.50)
GFR: 39.55 mL/min — ABNORMAL LOW (ref >60.00)
Glucose, Bld: 139 mg/dL — ABNORMAL HIGH (ref 70–99)
Potassium: 4.1 mEq/L (ref 3.5–5.1)
Sodium: 139 mEq/L (ref 135–145)

## 2021-10-24 LAB — HEMOGLOBIN A1C: Hgb A1c MFr Bld: 7.2 % — ABNORMAL HIGH (ref 4.6–6.5)

## 2021-10-26 ENCOUNTER — Other Ambulatory Visit: Payer: Self-pay

## 2021-10-26 ENCOUNTER — Ambulatory Visit (INDEPENDENT_AMBULATORY_CARE_PROVIDER_SITE_OTHER): Payer: Medicare Other | Admitting: Endocrinology

## 2021-10-26 VITALS — BP 90/54 | HR 70 | Ht 74.0 in | Wt 232.2 lb

## 2021-10-26 DIAGNOSIS — D751 Secondary polycythemia: Secondary | ICD-10-CM

## 2021-10-26 DIAGNOSIS — E291 Testicular hypofunction: Secondary | ICD-10-CM

## 2021-10-26 DIAGNOSIS — I952 Hypotension due to drugs: Secondary | ICD-10-CM

## 2021-10-26 DIAGNOSIS — E1165 Type 2 diabetes mellitus with hyperglycemia: Secondary | ICD-10-CM

## 2021-10-26 DIAGNOSIS — I2510 Atherosclerotic heart disease of native coronary artery without angina pectoris: Secondary | ICD-10-CM

## 2021-10-26 MED ORDER — NATESTO 5.5 MG/ACT NA GEL: NASAL | 1 refills | Status: DC

## 2021-10-26 MED ORDER — FREESTYLE LITE TEST VI STRP: ORAL_STRIP | 6 refills | Status: DC

## 2021-10-26 NOTE — Patient Instructions (Addendum)
Check blood sugars on waking up 2 days a week  Also check blood sugars about 2 hours after meals and do this after different meals by rotation  Recommended blood sugar levels on waking up are 90-130 and about 2 hours after meal is 130-160  Please bring your blood sugar monitor to each visit, thank you  STOP AMLODIpiNE until BP >140 then take 1/2 pill See Dr Larose Kells in 1 month  If kidney improved Metformin is 2x daily

## 2021-10-26 NOTE — Progress Notes (Signed)
Patient ID: Jonathan Leather., male   DOB: 03/31/1936, 86 y.o.   MRN: [id number]    Reason for Appointment:  Follow-up of various problems  History of Present Illness:          Diagnosis: Type 2 diabetes mellitus, date of diagnosis:  2009      Past history: He is not clear when his diabetes was diagnosed but according to hospital records it may have been in 2009. Most likely he was placed on metformin initially and at some point Amaryl added. In 2013 it was also given Januvia presumably to improve his control. However appears that his A1c has been consistently over 7% Janumet was started instead of metformin and Januvia separately in 2013  He was started on Trulicity in 6/54 instead of Victoza because excessive bruising on his abdomen  Later in 01/2015 he was switched to Bydureon because of insurance noncoverage of his Trulicity  Recent history:   Non-insulin hypoglycemic drugs are: Metformin 650 1x a day, Trulicity 3.0  mg weekly, Jardiance 12.5 mg daily  His A1c is the same at 7.2  Fructosamine previously 288  Current blood sugar patterns and problems: His blood sugars are being monitored with expired test strips  Recently appears to be checking mostly fasting readings only and his meter was programmed to the wrong time  He says that he is concerned about articles about metformin causing carcinogenesis and he wants to stop his, has not had GI side effects from this in the past but has never taken more than 1000 mg a day  Not clear what his blood sugars are after meals  Again he is not doing any formal exercise but just staying busy with taking care of his wife and house work  Weight is fairly stable sporadically again  Usually he is trying to eat healthy meals most of the time  Taking Trulicity regularly without side effects He is still getting Jardiance from the [facility] and doing half of the 25 mg tablets     Side effects from medications have been:  None  Glucose monitoring:  done usually once a day       Glucometer:  FreeStyle  Blood Glucose READINGS FROM REVIEW OF MONITOR   PRE-MEAL Fasting Lunch Dinner Bedtime Overall  Glucose range: 120-137 112, 139     Mean/median:     127   POST-MEAL PC Breakfast PC Lunch PC Dinner  Glucose range:   171  Mean/median:       Previously:  PRE-MEAL Fasting Lunch Dinner Bedtime Overall  Glucose range: 129   122 67-158  Mean/median:   146  131   POST-MEAL PC Breakfast PC Lunch PC Dinner  Glucose range:     Mean/median:   113       Meals: 3 meals per day. eating egg/meat bread for breakfast, dinner 6-7 PM     Dietician visit: Most recent: 4/16 .               Weight history:  Wt Readings from Last 3 Encounters:  10/26/21 232 lb 3.2 oz (105.3 kg)  09/20/21 231 lb (104.8 kg)  09/06/21 231 lb 6 oz (105 kg)   Glycemic control:   Lab Results  Component Value Date   HGBA1C 7.2 (H) 10/24/2021   HGBA1C 7.2 (H) 06/27/2021   HGBA1C 7.4 (H) 03/28/2021   Lab Results  Component Value Date   MICROALBUR <0.7 11/15/2020   [place] 63 06/27/2021  CREATININE 1.60 (H) 10/24/2021    Lab Results  Component Value Date   FRUCTOSAMINE 288 (H) 05/24/2020   FRUCTOSAMINE 284 08/26/2019   FRUCTOSAMINE 284 03/29/2017    OTHER active problems addressed today: See review of systems  Lab on 10/24/2021  Component Date Value Ref Range Status   Testosterone 10/24/2021 245.96 (L)  300.00 - 890.00 ng/dL Final   Sodium 10/24/2021 139  135 - 145 mEq/L Final   Potassium 10/24/2021 4.1  3.5 - 5.1 mEq/L Final   Chloride 10/24/2021 103  96 - 112 mEq/L Final   CO2 10/24/2021 25  19 - 32 mEq/L Final   Glucose, Bld 10/24/2021 139 (H)  70 - 99 mg/dL Final   BUN 10/24/2021 27 (H)  6 - 23 mg/dL Final   Creatinine, Ser 10/24/2021 1.60 (H)  0.40 - 1.50 mg/dL Final   GFR 10/24/2021 39.55 (L)  >60.00 mL/min Final   Calculated using the CKD-EPI Creatinine Equation (2021)   Calcium 10/24/2021 9.5  8.4 - 10.5  mg/dL Final   Hgb A1c MFr Bld 10/24/2021 7.2 (H)  4.6 - 6.5 % Final   Glycemic Control Guidelines for People with Diabetes:Non Diabetic:  <6%Goal of Therapy: <7%Additional Action Suggested:  >8%      Allergies as of 10/26/2021   No Known Allergies      Medication List        Accurate as of October 26, 2021  8:26 AM. If you have any questions, ask your nurse or doctor.          albuterol 108 (90 Base) MCG/ACT inhaler Commonly known as: VENTOLIN HFA Inhale 1-2 puffs into the lungs every 6 (six) hours as needed for wheezing or shortness of breath.   amLODipine 5 MG tablet Commonly known as: NORVASC Take 1 tablet (5 mg total) by mouth daily.   aspirin 81 MG tablet Take 81 mg by mouth daily.   empagliflozin 25 MG Tabs tablet Commonly known as: Jardiance Take 1 tablet (25 mg total) by mouth daily with breakfast.   FreeStyle Freedom Lite w/Device Kit Use Freestyle Freedom Lite meter to check blood sugar twice daily. DX:E11.65   freestyle lancets USE AS INSTRUCTED TO CHECK BLOOD SUGAR TWICE A DAY   FREESTYLE LITE test strip Generic drug: glucose blood Use as instructed What changed: See the new instructions. Changed by: Elayne Snare, MD   losartan 50 MG tablet Commonly known as: COZAAR Take 1 tablet (50 mg total) by mouth daily.   metFORMIN 1000 MG tablet Commonly known as: GLUCOPHAGE Take 500 mg by mouth daily with breakfast.   metoprolol succinate 100 MG 24 hr tablet Commonly known as: Toprol XL Take 1 tablet (100 mg total) by mouth daily.   multivitamin with minerals Tabs tablet Take 1 tablet by mouth daily.   simvastatin 20 MG tablet Commonly known as: ZOCOR Take 20 mg by mouth at bedtime.   Stiolto Respimat 2.5-2.5 MCG/ACT Aers Generic drug: Tiotropium Bromide-Olodaterol Inhale 2 puffs into the lungs daily as needed (for flares).   triamterene-hydrochlorothiazide 37.5-25 MG tablet Commonly known as: MAXZIDE-25 Take 1 tablet by mouth daily.    Trulicity 3 TM/2.2QJ Sopn Generic drug: Dulaglutide INJECT 3 MG UNDER THE SKIN ONCE A WEEK        Allergies: No Known Allergies  Past Medical History:  Diagnosis Date   CAD (coronary artery disease)    Two RCA stents remotely / 3rd RCA stent 2006   Colon polyps    s/p several Cscopes.  COPD (chronic obstructive pulmonary disease) (HCC)    on O2, nocturnal   Diabetes mellitus    dx aprox 2009   ED (erectile dysfunction)    has a vacumm device   Ejection fraction    Hyperlipidemia    dx in 90s   Hypertension    dx in the 55   Hypogonadism male    PVD (peripheral vascular disease) ([place])    s/p stents at LE 2009, Dr Einar Gip   Shortness of breath    O2 Sat dropped to 82% walking on the treadmill, September, 2012    Past Surgical History:  Procedure Laterality Date   APPENDECTOMY     TONSILLECTOMY      Family History  Problem Relation Age of Onset   Heart disease Father    Hypertension Father    Stroke Father    Diabetes Paternal Aunt    Diabetes Maternal Grandmother    Diabetes Other        GM, nephews, many family members   Hyperlipidemia Other        ?   Prostate cancer Brother    Colon cancer Neg Hx     Social History:  reports that he quit smoking about 44 years ago. His smoking use included cigarettes. He has a 60.00 pack-year smoking history. He has never used smokeless tobacco. He reports current alcohol use. He reports that he does not use drugs.    Review of Systems        Lipids: He has been on treatment with simvastatin 20 mg from his PCP below with good control as follows       Lab Results  Component Value Date   CHOL 127 06/27/2021   CHOL 141 02/14/2021   CHOL 117 02/24/2020   Lab Results  Component Value Date   HDL 42.30 06/27/2021   HDL 38.30 (L) 02/14/2021   HDL 37.00 (L) 02/24/2020   Lab Results  Component Value Date   LDLCALC 63 06/27/2021   LDLCALC 64 02/14/2021   LDLCALC 63 02/24/2020   Lab Results  Component Value  Date   TRIG 107.0 06/27/2021   TRIG 195.0 (H) 02/14/2021   TRIG 87.0 02/24/2020   Lab Results  Component Value Date   CHOLHDL 3 06/27/2021   CHOLHDL 4 02/14/2021   CHOLHDL 3 02/24/2020   Lab Results  Component Value Date   LDLDIRECT 55.0 01/01/2018                    HYPOGONADISM He had decreased libido and erectile dysfunction previously and was found to have hypogonadism, probably in 2011. He did have a slightly low free testosterone level in 2012 on treatment Prolactin level normal  Previously on AndroGel With his hemoglobin going up to 19.1 in August 2019 he has been told not to take any testosterone He was given clomiphene as a trial but he did not pick up the prescription  Testosterone level is persistently low without testosterone therapy  He does complain of staying consistently tired   Lab Results  Component Value Date   TESTOSTERONE 245.96 (L) 10/24/2021   TESTOSTERONE 215.23 (L) 05/24/2020   TESTOSTERONE 257.22 (L) 03/10/2020    He had been evaluated by hematologist for his erythrocytosis Hemoglobin has been high normal or slightly high Has not had a follow-up from his PCP  CBC Latest Ref Rng & Units 11/15/2020 05/24/2020 02/08/2020  WBC 4.0 - 10.5 K/uL 8.0 6.3 7.8  Hemoglobin 13.0 - 17.0 g/dL  17.2(H) 17.7(H) 16.2  Hematocrit 39.0 - 52.0 % 50.4 52.6(H) 48.3  Platelets 150.0 - 400.0 K/uL 201.0 190.0 193.0        HYPERTENSION: This is followed by PCP and is on 50 mg of losartan along with half Maxide, amlodipine 5 mg and metoprolol   He is checking at home periodically but not recently and does not think his blood pressure is below 130 at home  Blood pressure was low checked twice, does not complain of feeling lightheaded  BP Readings from Last 3 Encounters:  10/26/21 (!) 90/54  09/20/21 122/66  09/06/21 116/68   RENAL dysfunction: His creatinine is relatively higher   Lab Results  Component Value Date   CREATININE 1.60 (H) 10/24/2021   CREATININE  1.35 06/27/2021   CREATININE 1.68 (H) 03/28/2021   NEUROPATHY: He has had persistent sensory loss, also has tingling in his distal feet   LABS:  Lab on 10/24/2021  Component Date Value Ref Range Status   Testosterone 10/24/2021 245.96 (L)  300.00 - 890.00 ng/dL Final   Sodium 10/24/2021 139  135 - 145 mEq/L Final   Potassium 10/24/2021 4.1  3.5 - 5.1 mEq/L Final   Chloride 10/24/2021 103  96 - 112 mEq/L Final   CO2 10/24/2021 25  19 - 32 mEq/L Final   Glucose, Bld 10/24/2021 139 (H)  70 - 99 mg/dL Final   BUN 10/24/2021 27 (H)  6 - 23 mg/dL Final   Creatinine, Ser 10/24/2021 1.60 (H)  0.40 - 1.50 mg/dL Final   GFR 10/24/2021 39.55 (L)  >60.00 mL/min Final   Calculated using the CKD-EPI Creatinine Equation (2021)   Calcium 10/24/2021 9.5  8.4 - 10.5 mg/dL Final   Hgb A1c MFr Bld 10/24/2021 7.2 (H)  4.6 - 6.5 % Final   Glycemic Control Guidelines for People with Diabetes:Non Diabetic:  <6%Goal of Therapy: <7%Additional Action Suggested:  >8%     Physical Examination:  BP (!) 90/54    Pulse 70    Ht _0  (1.88 m)    Wt 232 lb 3.2 oz (105.3 kg)    SpO2 93%    BMI 29.81 kg/m       ASSESSMENT/PLAN:   Diabetes type 2, with mild obesity, non-insulin-dependent  His A1c is stable at 7.2  Currently on 3 mg Trulicity, 19.4 mg mg Jardiance and Metformin 500 mg daily  Considering his age and comorbid conditions his blood sugar control is fairly good He is usually not checking enough readings after meals to assess postprandial high sugars but fasting readings are generally fairly good  However he has not replaced his expired test trips and this was pointed out to him  Also the time on his monitor needs to be programmed properly  Recently because of renal dysfunction his Vania Rea may not be as effective Also he has concerns about Internet articles about metformin causing cancer and this was discussed and reassured him about the safety   HYPERTENSION: Blood pressure is again very  low and he is having some nonspecific weakness and worsening renal function Emphasized the need to STOP amlodipine and follow-up with his PCP in about a month He does need to probably check his blood pressure meter in the office to compare and verify accuracy  RENAL dysfunction: Variable and this is impaired because of low blood pressure likely and will need follow-up   History of erythrocytosis: He will need to follow-up with PCP for periodic monitoring, message sent  Hypogonadism: Discussed that [facility]  appears to be available on his formulary and since this does not cause erythrocytosis and he has symptoms of fatigue will empirically try this until the next visit Explained to him how this is used   Patient Instructions  Check blood sugars on waking up 2 days a week  Also check blood sugars about 2 hours after meals and do this after different meals by rotation  Recommended blood sugar levels on waking up are 90-130 and about 2 hours after meal is 130-160  Please bring your blood sugar monitor to each visit, thank you  T  Total visit time including counseling = 40 minutes  Elayne Snare 10/26/2021, 8:26 AM   Note: This office note was prepared with Dragon voice recognition system technology. Any transcriptional errors that result from this process are unintentional.

## 2021-10-31 ENCOUNTER — Encounter: Payer: Self-pay | Admitting: Internal Medicine

## 2021-11-09 LAB — HM DIABETES EYE EXAM

## 2021-11-17 ENCOUNTER — Other Ambulatory Visit: Payer: Self-pay

## 2021-11-17 DIAGNOSIS — E1165 Type 2 diabetes mellitus with hyperglycemia: Secondary | ICD-10-CM

## 2021-11-17 MED ORDER — FREESTYLE FREEDOM LITE W/DEVICE KIT: PACK | 0 refills | Status: AC

## 2021-12-01 ENCOUNTER — Encounter: Payer: Self-pay | Admitting: Internal Medicine

## 2021-12-28 ENCOUNTER — Telehealth: Payer: Self-pay | Admitting: Endocrinology

## 2021-12-28 ENCOUNTER — Emergency Department (HOSPITAL_COMMUNITY)
Admission: EM | Admit: 2021-12-28 | Discharge: 2021-12-29 | Discharge status: Against Medical Advice | Payer: Medicare Other | Attending: Emergency Medicine | Admitting: Emergency Medicine

## 2021-12-28 ENCOUNTER — Other Ambulatory Visit: Payer: Self-pay

## 2021-12-28 ENCOUNTER — Other Ambulatory Visit (INDEPENDENT_AMBULATORY_CARE_PROVIDER_SITE_OTHER): Payer: Medicare Other

## 2021-12-28 ENCOUNTER — Emergency Department (HOSPITAL_COMMUNITY): Payer: Medicare Other

## 2021-12-28 DIAGNOSIS — J9811 Atelectasis: Secondary | ICD-10-CM | POA: Diagnosis not present

## 2021-12-28 DIAGNOSIS — R197 Diarrhea, unspecified: Secondary | ICD-10-CM | POA: Diagnosis not present

## 2021-12-28 DIAGNOSIS — E291 Testicular hypofunction: Secondary | ICD-10-CM | POA: Diagnosis not present

## 2021-12-28 DIAGNOSIS — Z5321 Procedure and treatment not carried out due to patient leaving prior to being seen by health care provider: Secondary | ICD-10-CM | POA: Diagnosis not present

## 2021-12-28 DIAGNOSIS — R5383 Other fatigue: Secondary | ICD-10-CM | POA: Diagnosis not present

## 2021-12-28 DIAGNOSIS — R0789 Other chest pain: Secondary | ICD-10-CM | POA: Diagnosis not present

## 2021-12-28 DIAGNOSIS — E1165 Type 2 diabetes mellitus with hyperglycemia: Secondary | ICD-10-CM | POA: Diagnosis not present

## 2021-12-28 DIAGNOSIS — R079 Chest pain, unspecified: Secondary | ICD-10-CM | POA: Diagnosis not present

## 2021-12-28 DIAGNOSIS — D751 Secondary polycythemia: Secondary | ICD-10-CM

## 2021-12-28 DIAGNOSIS — R12 Heartburn: Secondary | ICD-10-CM | POA: Diagnosis not present

## 2021-12-28 LAB — COMPREHENSIVE METABOLIC PANEL
ALT: 21 U/L (ref 0–44)
AST: 21 U/L (ref 15–41)
Albumin: 4.1 g/dL (ref 3.5–5.0)
Alkaline Phosphatase: 59 U/L (ref 38–126)
Anion gap: 13 (ref 5–15)
BUN: 29 mg/dL — ABNORMAL HIGH (ref 8–23)
CO2: 22 mmol/L (ref 22–32)
Calcium: 9.4 mg/dL (ref 8.9–10.3)
Chloride: 101 mmol/L (ref 98–111)
Creatinine, Ser: 2.09 mg/dL — ABNORMAL HIGH (ref 0.61–1.24)
GFR, Estimated: 31 mL/min — ABNORMAL LOW (ref >60)
Glucose, Bld: 163 mg/dL — ABNORMAL HIGH (ref 70–99)
Potassium: 3.7 mmol/L (ref 3.5–5.1)
Sodium: 136 mmol/L (ref 135–145)
Total Bilirubin: 1.4 mg/dL — ABNORMAL HIGH (ref 0.3–1.2)
Total Protein: 7.5 g/dL (ref 6.5–8.1)

## 2021-12-28 LAB — CBC WITH DIFFERENTIAL/PLATELET
Abs Immature Granulocytes: 0.05 10*3/uL (ref 0.00–0.07)
Basophils Absolute: 0.1 10*3/uL (ref 0.0–0.1)
Basophils Relative: 0 %
Eosinophils Absolute: 0.2 10*3/uL (ref 0.0–0.5)
Eosinophils Relative: 1 %
HCT: 57.6 % — ABNORMAL HIGH (ref 39.0–52.0)
Hemoglobin: 19.6 g/dL — ABNORMAL HIGH (ref 13.0–17.0)
Immature Granulocytes: 0 %
Lymphocytes Relative: 17 %
Lymphs Abs: 2.5 10*3/uL (ref 0.7–4.0)
MCH: 33.3 pg (ref 26.0–34.0)
MCHC: 34 g/dL (ref 30.0–36.0)
MCV: 97.8 fL (ref 80.0–100.0)
Monocytes Absolute: 1.2 10*3/uL — ABNORMAL HIGH (ref 0.1–1.0)
Monocytes Relative: 8 %
Neutro Abs: 10.3 10*3/uL — ABNORMAL HIGH (ref 1.7–7.7)
Neutrophils Relative %: 74 %
Platelets: 255 10*3/uL (ref 150–400)
RBC: 5.89 MIL/uL — ABNORMAL HIGH (ref 4.22–5.81)
RDW: 14 % (ref 11.5–15.5)
WBC: 14.2 10*3/uL — ABNORMAL HIGH (ref 4.0–10.5)
nRBC: 0 % (ref 0.0–0.2)

## 2021-12-28 LAB — CBC
HCT: 55.7 % — ABNORMAL HIGH (ref 39.0–52.0)
Hemoglobin: 18.8 g/dL (ref 13.0–17.0)
MCHC: 33.7 g/dL (ref 30.0–36.0)
MCV: 98 fl (ref 78.0–100.0)
Platelets: 240 10*3/uL (ref 150.0–400.0)
RBC: 5.69 Mil/uL (ref 4.22–5.81)
RDW: 14.9 % (ref 11.5–15.5)
WBC: 9.7 10*3/uL (ref 4.0–10.5)

## 2021-12-28 LAB — BASIC METABOLIC PANEL
BUN: 26 mg/dL — ABNORMAL HIGH (ref 6–23)
CO2: 24 mEq/L (ref 19–32)
Calcium: 9.5 mg/dL (ref 8.4–10.5)
Chloride: 101 mEq/L (ref 96–112)
Creatinine, Ser: 1.63 mg/dL — ABNORMAL HIGH (ref 0.40–1.50)
GFR: 38.63 mL/min — ABNORMAL LOW (ref >60.00)
Glucose, Bld: 124 mg/dL — ABNORMAL HIGH (ref 70–99)
Potassium: 4.2 mEq/L (ref 3.5–5.1)
Sodium: 136 mEq/L (ref 135–145)

## 2021-12-28 LAB — TESTOSTERONE: Testosterone: 256.52 ng/dL — ABNORMAL LOW (ref 300.00–890.00)

## 2021-12-28 LAB — TROPONIN I (HIGH SENSITIVITY): Troponin I (High Sensitivity): 8 ng/L (ref <18)

## 2021-12-28 MED ORDER — LIDOCAINE VISCOUS HCL 2 % MT SOLN
15.0000 mL | Freq: Once | OROMUCOSAL | Status: AC
Start: 2021-12-28 — End: 2021-12-28
  Administered 2021-12-28: 15 mL via ORAL
  Filled 2021-12-28: qty 15

## 2021-12-28 MED ORDER — ALUM & MAG HYDROXIDE-SIMETH 200-200-20 MG/5ML PO SUSP
30.0000 mL | Freq: Once | ORAL | Status: AC
Start: 2021-12-28 — End: 2021-12-28
  Administered 2021-12-28: 30 mL via ORAL
  Filled 2021-12-28: qty 30

## 2021-12-28 NOTE — ED Notes (Signed)
Pt was unable to provide a urine sample at this time ?

## 2021-12-28 NOTE — Telephone Encounter (Signed)
His red blood cell count has gone up excessively, he needs to stop his testosterone nasal gel and scheduled follow-up with his PCP in about a month ?

## 2021-12-28 NOTE — ED Provider Triage Note (Signed)
Emergency Medicine Provider Triage Evaluation Note ? ?Jonathan [name] , a 86 y.o. male  was evaluated in triage.  Pt complains of heartburn after eating for the past 2 days. Denies any chest pain or SOB. Denies any nausea or vomiting. Reports watery stool without darkness or blood. He took some immodium but is still having some diarrhea. Denies any abdominal pain. Reports fatigue, not weakness.  ? ?Review of Systems  ?Positive:  ?Negative:  ? ?Physical Exam  ?BP 102/72 (BP Location: Left Arm)   Pulse (!) 107   Temp 97.7 ?F (36.5 ?C) (Oral)   Resp 18   Ht '6\' 2"'$  (1.88 m)   Wt 106.6 kg   SpO2 93%   BMI 30.17 kg/m?  ?Gen:   Awake, no distress   ?Resp:  Normal effort, CTAB  ?MSK:   Moves extremities without difficulty  ?Other:  No abdominal tenderness. Soft. NBS. Mild tachycardia, but regular.  ? ?Medical Decision Making  ?Medically screening exam initiated at 8:58 PM.  Appropriate orders placed.  Jonathan [name]. was informed that the remainder of the evaluation will be completed by another provider, this initial triage assessment does not replace that evaluation, and the importance of remaining in the ED until their evaluation is complete. ? ?Had labs done at PCP office today which showed hemoglobin at 18.8 hct at 55.7. Will recheck. Ordered GI cocktail. ?  ?Sherrell Puller, PA-C ?12/28/21 2103 ? ?

## 2021-12-28 NOTE — ED Triage Notes (Signed)
Pt arrives to ED POV c/o weakness x2 days. Pt states that every time he eats he has heart burn and all he wants to do is sleep. Denies any CP or SHOB. ?

## 2021-12-29 DIAGNOSIS — R197 Diarrhea, unspecified: Secondary | ICD-10-CM | POA: Diagnosis not present

## 2021-12-29 LAB — TROPONIN I (HIGH SENSITIVITY): Troponin I (High Sensitivity): 8 ng/L (ref <18)

## 2021-12-29 LAB — FRUCTOSAMINE: Fructosamine: 319 umol/L — ABNORMAL HIGH (ref 0–285)

## 2021-12-29 NOTE — Telephone Encounter (Signed)
Dr. Dwyane Dee: Thank you. ? ?Jonathan [name]: ?Please arrange a hematology referral.   ?Dx erythrocytosis, rule out hemochromatosis ?

## 2021-12-29 NOTE — Telephone Encounter (Signed)
Referral placed.

## 2021-12-29 NOTE — ED Notes (Signed)
Pt refusing vitals at this time.

## 2022-01-01 ENCOUNTER — Encounter: Payer: Medicare Other | Admitting: Family

## 2022-01-01 ENCOUNTER — Other Ambulatory Visit: Payer: Medicare Other

## 2022-01-01 DIAGNOSIS — Z20822 Contact with and (suspected) exposure to covid-19: Secondary | ICD-10-CM | POA: Diagnosis not present

## 2022-01-02 ENCOUNTER — Inpatient Hospital Stay: Payer: Medicare Other

## 2022-01-02 ENCOUNTER — Encounter: Payer: Self-pay | Admitting: Family

## 2022-01-02 ENCOUNTER — Inpatient Hospital Stay: Payer: Medicare Other | Attending: Hematology & Oncology

## 2022-01-02 ENCOUNTER — Inpatient Hospital Stay (HOSPITAL_BASED_OUTPATIENT_CLINIC_OR_DEPARTMENT_OTHER): Payer: Medicare Other | Admitting: Family

## 2022-01-02 ENCOUNTER — Other Ambulatory Visit: Payer: Self-pay

## 2022-01-02 ENCOUNTER — Other Ambulatory Visit: Payer: Self-pay | Admitting: Family

## 2022-01-02 ENCOUNTER — Ambulatory Visit: Payer: Medicare Other | Admitting: Endocrinology

## 2022-01-02 VITALS — BP 110/80 | HR 83 | Temp 98.2°F | Resp 18 | Ht 74.0 in | Wt 227.8 lb

## 2022-01-02 DIAGNOSIS — J449 Chronic obstructive pulmonary disease, unspecified: Secondary | ICD-10-CM | POA: Diagnosis not present

## 2022-01-02 DIAGNOSIS — D751 Secondary polycythemia: Secondary | ICD-10-CM

## 2022-01-02 DIAGNOSIS — Z87891 Personal history of nicotine dependence: Secondary | ICD-10-CM | POA: Diagnosis not present

## 2022-01-02 DIAGNOSIS — I10 Essential (primary) hypertension: Secondary | ICD-10-CM | POA: Insufficient documentation

## 2022-01-02 DIAGNOSIS — E119 Type 2 diabetes mellitus without complications: Secondary | ICD-10-CM | POA: Insufficient documentation

## 2022-01-02 LAB — CBC WITH DIFFERENTIAL (CANCER CENTER ONLY)
Abs Immature Granulocytes: 0.03 10*3/uL (ref 0.00–0.07)
Basophils Absolute: 0.1 10*3/uL (ref 0.0–0.1)
Basophils Relative: 1 %
Eosinophils Absolute: 0.3 10*3/uL (ref 0.0–0.5)
Eosinophils Relative: 3 %
HCT: 49.4 % (ref 39.0–52.0)
Hemoglobin: 17 g/dL (ref 13.0–17.0)
Immature Granulocytes: 0 %
Lymphocytes Relative: 31 %
Lymphs Abs: 2.9 10*3/uL (ref 0.7–4.0)
MCH: 33 pg (ref 26.0–34.0)
MCHC: 34.4 g/dL (ref 30.0–36.0)
MCV: 95.9 fL (ref 80.0–100.0)
Monocytes Absolute: 1 10*3/uL (ref 0.1–1.0)
Monocytes Relative: 11 %
Neutro Abs: 5.2 10*3/uL (ref 1.7–7.7)
Neutrophils Relative %: 54 %
Platelet Count: 240 10*3/uL (ref 150–400)
RBC: 5.15 MIL/uL (ref 4.22–5.81)
RDW: 13.3 % (ref 11.5–15.5)
WBC Count: 9.5 10*3/uL (ref 4.0–10.5)
nRBC: 0 % (ref 0.0–0.2)

## 2022-01-02 LAB — FERRITIN: Ferritin: 124 ng/mL (ref 24–336)

## 2022-01-02 LAB — CMP (CANCER CENTER ONLY)
ALT: 18 U/L (ref 0–44)
AST: 17 U/L (ref 15–41)
Albumin: 4 g/dL (ref 3.5–5.0)
Alkaline Phosphatase: 69 U/L (ref 38–126)
Anion gap: 11 (ref 5–15)
BUN: 21 mg/dL (ref 8–23)
CO2: 26 mmol/L (ref 22–32)
Calcium: 9.2 mg/dL (ref 8.9–10.3)
Chloride: 103 mmol/L (ref 98–111)
Creatinine: 1.49 mg/dL — ABNORMAL HIGH (ref 0.61–1.24)
GFR, Estimated: 46 mL/min — ABNORMAL LOW (ref >60)
Glucose, Bld: 116 mg/dL — ABNORMAL HIGH (ref 70–99)
Potassium: 3.3 mmol/L — ABNORMAL LOW (ref 3.5–5.1)
Sodium: 140 mmol/L (ref 135–145)
Total Bilirubin: 0.6 mg/dL (ref 0.3–1.2)
Total Protein: 7.1 g/dL (ref 6.5–8.1)

## 2022-01-02 LAB — SAVE SMEAR(SSMR), FOR PROVIDER SLIDE REVIEW

## 2022-01-02 LAB — RETICULOCYTES
Immature Retic Fract: 8 % (ref 2.3–15.9)
RBC.: 5.26 MIL/uL (ref 4.22–5.81)
Retic Count, Absolute: 80.5 10*3/uL (ref 19.0–186.0)
Retic Ct Pct: 1.5 % (ref 0.4–3.1)

## 2022-01-02 LAB — LACTATE DEHYDROGENASE: LDH: 166 U/L (ref 98–192)

## 2022-01-02 NOTE — Progress Notes (Signed)
Hematology/Oncology Consultation  ? ?Name: Jonathan [name].      MRN: [id number]    Location: Room/bed info not found  Date: 01/02/2022 Time:2:33 PM ? ? ?REFERRING PHYSICIAN:  Kathlene November, MD ? ?REASON FOR CONSULT: Erythrocytosis  ?  ?DIAGNOSIS: Chronic secondary polycythemia, COPD ? ?HISTORY OF PRESENT ILLNESS: Jonathan [name] is a very pleasant 86 yo gentleman with erythrocytosis going back over 10 years.  ?He was previously seen in our office by Dr. Maylon Peppers [phone] for this same issue.  ?He denies any changes since that time.  ?He has COPD and associated SOB with exertion. He states that he is supposed to wear 2L [place] supplemental O2 at night but admits that he doesn't do this consistently.  ?He stopped using testosterone gel in 2019. No supplementation use at this time.  ?No personal or known familial history of cancer.  ?His last colonoscopy was in March 2016 with Dr. Verdia Kuba. He has history of colonic polyps and scope showed diverticulosis if the large intestine without perforation or abscess and small internal hemorrhoids.   ?He is diabetic and on Trulicity.  ?No history of thyroid disease.  ?He states that he had a heart attack in 2003.  ?He states that his only surgery was having his appendix removed at 86 yo.  ?No fever, chills, n/v, cough, rash, dizziness, chest pain, palpitations, abdominal pain or changes in bowel or bladder habits.  ?No swelling, tenderness, numbness or tingling in his extremities.  ?His feet sometimes get cold.  ?No falls or syncope.  ?He quit smoking in 1978. Rare ETOH socially. No recreational drug use.  ?He has maintained a good appetite and is staying well hydrated throughout the day.  ?He stays quite busy taking care of his wife who has dementia.  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 We are so thankful for his service to our country.  ? ?ROS: All other 10 point review of systems is negative.  ? ?PAST MEDICAL HISTORY:   ?Past Medical History:  ?Diagnosis Date  ? CAD  (coronary artery disease)   ? Two RCA stents remotely / 3rd RCA stent 2006  ? Colon polyps   ? s/p several Cscopes.  ? COPD (chronic obstructive pulmonary disease) ([place])   ? on O2, nocturnal  ? Diabetes mellitus   ? dx aprox 2009  ? ED (erectile dysfunction)   ? has a vacumm device  ? Ejection fraction   ? Hyperlipidemia   ? dx in 90s  ? Hypertension   ? dx in the 90  ? Hypogonadism male   ? PVD (peripheral vascular disease) ([place])   ? s/p stents at LE 2009, Dr Einar Gip  ? Shortness of breath   ? O2 Sat dropped to 82% walking on the treadmill, September, 2012  ? ? ?ALLERGIES: ?No Known Allergies ?   ?MEDICATIONS:  ?Current Outpatient Medications on File Prior to Visit  ?Medication Sig Dispense Refill  ? albuterol (VENTOLIN HFA) 108 (90 Base) MCG/ACT inhaler Inhale 1-2 puffs into the lungs every 6 (six) hours as needed for wheezing or shortness of breath. 54 g 3  ? amLODipine (NORVASC) 5 MG tablet Take 1 tablet (5 mg total) by mouth daily. 90 tablet 3  ? aspirin 81 MG tablet Take 81 mg by mouth daily.    ? Blood Glucose Monitoring Suppl (FREESTYLE FREEDOM LITE) w/Device KIT Use Freestyle Freedom Lite meter to check blood sugar twice daily. DX:E11.65 1 kit 0  ?  empagliflozin (JARDIANCE) 10 MG TABS tablet Take 10 mg by mouth daily.    ? empagliflozin (JARDIANCE) 25 MG TABS tablet Take 1 tablet (25 mg total) by mouth daily with breakfast. (Patient not taking: Reported on 12/29/2021) 90 tablet 1  ? glucose blood (FREESTYLE LITE) test strip Use as instructed 100 each 6  ? Lancets (FREESTYLE) lancets USE AS INSTRUCTED TO CHECK BLOOD SUGAR TWICE A DAY 200 each 1  ? losartan (COZAAR) 100 MG tablet Take 100 mg by mouth daily.    ? losartan (COZAAR) 50 MG tablet Take 1 tablet (50 mg total) by mouth daily. (Patient not taking: Reported on 12/29/2021) 90 tablet 3  ? metFORMIN (GLUCOPHAGE) 1000 MG tablet Take 500 mg by mouth daily with breakfast.  (Patient not taking: Reported on 10/26/2021)    ? metoprolol succinate (TOPROL XL) 100 MG  24 hr tablet Take 1 tablet (100 mg total) by mouth daily. 90 tablet 2  ? Multiple Vitamin (MULTIVITAMIN WITH MINERALS) TABS tablet Take 1 tablet by mouth daily.    ? simvastatin (ZOCOR) 20 MG tablet Take 20 mg by mouth at bedtime.     ? Testosterone (NATESTO) 5.5 MG/ACT GEL Apply 1 squirt of pump in each nostril 2 times a day (Patient not taking: Reported on 12/29/2021) 66 g 1  ? Tiotropium Bromide-Olodaterol (STIOLTO RESPIMAT) 2.5-2.5 MCG/ACT AERS Inhale 2 puffs into the lungs daily as needed (for flares). (Patient not taking: Reported on 12/29/2021) 12 g 3  ? triamterene-hydrochlorothiazide (MAXZIDE-25) 37.5-25 MG tablet Take 1 tablet by mouth daily.     ? TRULICITY 3 BJ/6.2GB SOPN INJECT 3 MG UNDER THE SKIN ONCE A WEEK (Patient taking differently: Inject 3 mg into the skin every 7 (seven) days.) 6 mL 3  ? ?No current facility-administered medications on file prior to visit.  ? ?  ?PAST SURGICAL HISTORY ?Past Surgical History:  ?Procedure Laterality Date  ? APPENDECTOMY    ? TONSILLECTOMY    ? ? ?FAMILY HISTORY: ?Family History  ?Problem Relation Age of Onset  ? Heart disease Father   ? Hypertension Father   ? Stroke Father   ? Diabetes Paternal Aunt   ? Diabetes Maternal Grandmother   ? Diabetes Other   ?     GM, nephews, many family members  ? Hyperlipidemia Other   ?     ?  ? Prostate cancer Brother   ? Colon cancer Neg Hx   ? ? ?SOCIAL HISTORY: ? reports that he quit smoking about 44 years ago. His smoking use included cigarettes. He has a 60.00 pack-year smoking history. He has never used smokeless tobacco. He reports current alcohol use. He reports that he does not use drugs. ? ?PERFORMANCE STATUS: ?The patient's performance status is 0 - Asymptomatic ? ?PHYSICAL EXAM: ?Most Recent Vital Signs: There were no vitals taken for this visit. ?BP 110/80 (BP Location: Left Arm, Patient Position: Sitting)   Pulse 83   Temp 98.2 ?F (36.8 ?C) (Oral)   Resp 18   Ht '6\' 2"'  (1.88 m)   Wt 227 lb 12.8 oz (103.3 kg)    SpO2 98%   BMI 29.25 kg/m?  ? ?General Appearance:    Alert, cooperative, no distress, appears stated age  ?Head:    Normocephalic, without obvious abnormality, atraumatic  ?Eyes:    PERRL, conjunctiva/corneas clear, EOM's intact, fundi  ?  benign, both eyes       ?   ?   ?Throat:   Lips, mucosa, and tongue  normal; teeth and gums normal  ?Neck:   Supple, symmetrical, trachea midline, no adenopathy;     ?  thyroid:  No enlargement/tenderness/nodules; no carotid ?  bruit or JVD  ?Back:     Symmetric, no curvature, ROM normal, no CVA tenderness  ?Lungs:     Clear to auscultation bilaterally, respirations unlabored  ?Chest wall:    No tenderness or deformity  ?Heart:    Regular rate and rhythm, S1 and S2 normal, no murmur, rub   or gallop  ?Abdomen:     Soft, non-tender, bowel sounds active all four quadrants,  ?  no masses, no organomegaly  ?   ?   ?Extremities:   Extremities normal, atraumatic, no cyanosis or edema  ?Pulses:   2+ and symmetric all extremities  ?Skin:   Skin color, texture, turgor normal, no rashes or lesions  ?Lymph nodes:   Cervical, supraclavicular, and axillary nodes normal  ?Neurologic:   CNII-XII intact. Normal strength, sensation and reflexes    ?  throughout  ? ? ?LABORATORY DATA:  ?Results for orders placed or performed in visit on 01/02/22 (from the past 48 hour(s))  ?CBC with Differential (Cancer Center Only)     Status: None (Preliminary result)  ? Collection Time: 01/02/22  2:12 PM  ?Result Value Ref Range  ? WBC Count 9.5 4.0 - 10.5 K/uL  ? RBC 5.15 4.22 - 5.81 MIL/uL  ? Hemoglobin 17.0 13.0 - 17.0 g/dL  ? HCT 49.4 39.0 - 52.0 %  ? MCV 95.9 80.0 - 100.0 fL  ? MCH 33.0 26.0 - 34.0 pg  ? MCHC 34.4 30.0 - 36.0 g/dL  ? RDW 13.3 11.5 - 15.5 %  ? Platelet Count 240 150 - 400 K/uL  ? nRBC 0.0 0.0 - 0.2 %  ?  Comment: Performed at [facility] at [facility], [address] [place], [place] [address]  ? Neutrophils Relative % PENDING %  ? Neutro Abs PENDING 1.7 - 7.7  K/uL  ? Band Neutrophils PENDING %  ? Lymphocytes Relative PENDING %  ? Lymphs Abs PENDING 0.7 - 4.0 K/uL  ? Monocytes Relative PENDING %  ? Monocytes Absolute PENDING 0.1 - 1.0 K/uL  ? Eosinophils Relative PEN

## 2022-01-03 ENCOUNTER — Telehealth: Payer: Self-pay | Admitting: *Deleted

## 2022-01-03 ENCOUNTER — Ambulatory Visit: Payer: Medicare Other | Admitting: Internal Medicine

## 2022-01-03 ENCOUNTER — Encounter: Payer: Self-pay | Admitting: Internal Medicine

## 2022-01-03 LAB — IRON AND IRON BINDING CAPACITY (CC-WL,HP ONLY)
Iron: 61 ug/dL (ref 45–182)
Saturation Ratios: 23 % (ref 17.9–39.5)
TIBC: 266 ug/dL (ref 250–450)
UIBC: 205 ug/dL (ref 117–376)

## 2022-01-03 LAB — ERYTHROPOIETIN: Erythropoietin: 15.2 m[IU]/mL (ref 2.6–18.5)

## 2022-01-03 NOTE — Telephone Encounter (Signed)
No 01/02/22 LOS ?

## 2022-01-04 ENCOUNTER — Encounter: Payer: Self-pay | Admitting: Endocrinology

## 2022-01-04 ENCOUNTER — Ambulatory Visit (INDEPENDENT_AMBULATORY_CARE_PROVIDER_SITE_OTHER): Payer: Medicare Other | Admitting: Endocrinology

## 2022-01-04 VITALS — BP 132/72 | HR 83 | Ht 74.0 in | Wt 230.0 lb

## 2022-01-04 DIAGNOSIS — E1165 Type 2 diabetes mellitus with hyperglycemia: Secondary | ICD-10-CM

## 2022-01-04 DIAGNOSIS — I10 Essential (primary) hypertension: Secondary | ICD-10-CM

## 2022-01-04 DIAGNOSIS — I2510 Atherosclerotic heart disease of native coronary artery without angina pectoris: Secondary | ICD-10-CM | POA: Diagnosis not present

## 2022-01-04 DIAGNOSIS — E291 Testicular hypofunction: Secondary | ICD-10-CM

## 2022-01-04 NOTE — Patient Instructions (Addendum)
Metformin 1/2 pill 2x daily ?

## 2022-01-04 NOTE — Progress Notes (Signed)
=       ? ? ?Patient ID: Jonathan Leather., male   DOB: [birth year], 86 y.o.   MRN: [id number] ? ? ? ?Reason for Appointment:  Follow-up of various problems ? ?History of Present Illness:  ?        ?Diagnosis: Type 2 diabetes mellitus, date of diagnosis:  2009     ? ?Past history: He is not clear when his diabetes was diagnosed but according to hospital records it may have been in 2009. Most likely he was placed on metformin initially and at some point Amaryl added. ?In 2013 it was also given Januvia presumably to improve his control. However appears that his A1c has been consistently over 7% ?Janumet was started instead of metformin and Januvia separately in 2013  ?He was started on Trulicity in 2/54 instead of Victoza because excessive bruising on his abdomen  ?Later in 01/2015 he was switched to Bydureon because of insurance noncoverage of his Trulicity ? ?Recent history:  ? ?Non-insulin hypoglycemic drugs are: Metformin 270 1x a day, Trulicity 3.0  mg weekly, Jardiance 10 mg daily ? ?His A1c is last 7.2 ? ?Fructosamine 399 compared to 288 ? ?Current blood sugar patterns and problems: ?His blood sugars may be higher with his having difficulty getting Trulicity filled consistently at the pharmacy  ?Now taking 10 mg Jardiance, previously dose was reduced because of renal dysfunction  ?Because of his renal dysfunction also metformin has been reduced to only 1 tablet a day  ?Not clear if he is using new test strips as recommended on the last visit but he thinks his blood sugars are nearly normal even after meals  ?He did not have his meter to download today ?As before he is generally active with taking care of his wife but not doing a lot of formal exercise ? ? ?  Side effects from medications have been: None ? ?Glucose monitoring:  done usually once a day       Glucometer:  FreeStyle ? ?Blood Glucose readings recently from recall: ? ? ?PRE-MEAL Fasting Lunch Dinner Bedtime Overall  ?Glucose range: 110-120       ?Mean/median:       ? ?POST-MEAL PC Breakfast PC Lunch PC Dinner  ?Glucose range:   130  ?Mean/median:     ? ?Previous data: ? ?PRE-MEAL Fasting Lunch Dinner Bedtime Overall  ?Glucose range: 120-137 112, 139     ?Mean/median:     127  ? ?POST-MEAL PC Breakfast PC Lunch PC Dinner  ?Glucose range:   171  ?Mean/median:     ? ? ?  ? Meals: 3 meals per day. eating egg/meat bread for breakfast, dinner 6-7 PM   ? ? ?Dietician visit: Most recent: 4/16 .              ? ?Weight history: ? ?Wt Readings from Last 3 Encounters:  ?01/04/22 230 lb (104.3 kg)  ?01/02/22 227 lb 12.8 oz (103.3 kg)  ?12/28/21 235 lb (106.6 kg)  ? ?Glycemic control:  ? ?Lab Results  ?Component Value Date  ? HGBA1C 7.2 (H) 10/24/2021  ? HGBA1C 7.2 (H) 06/27/2021  ? HGBA1C 7.4 (H) 03/28/2021  ? ?Lab Results  ?Component Value Date  ? MICROALBUR <0.7 11/15/2020  ? [place] 63 06/27/2021  ? CREATININE 1.49 (H) 01/02/2022  ? ? ?Lab Results  ?Component Value Date  ? FRUCTOSAMINE 319 (H) 12/28/2021  ? FRUCTOSAMINE 288 (H) 05/24/2020  ? FRUCTOSAMINE 284 08/26/2019  ? ? ?OTHER active  problems addressed today: See review of systems ? ?Appointment on 01/02/2022  ?Component Date Value Ref Range Status  ? WBC Count 01/02/2022 9.5  4.0 - 10.5 K/uL Final  ? RBC 01/02/2022 5.15  4.22 - 5.81 MIL/uL Final  ? Hemoglobin 01/02/2022 17.0  13.0 - 17.0 g/dL Final  ? HCT 01/02/2022 49.4  39.0 - 52.0 % Final  ? MCV 01/02/2022 95.9  80.0 - 100.0 fL Final  ? MCH 01/02/2022 33.0  26.0 - 34.0 pg Final  ? MCHC 01/02/2022 34.4  30.0 - 36.0 g/dL Final  ? RDW 01/02/2022 13.3  11.5 - 15.5 % Final  ? Platelet Count 01/02/2022 240  150 - 400 K/uL Final  ? nRBC 01/02/2022 0.0  0.0 - 0.2 % Final  ? Neutrophils Relative % 01/02/2022 54  % Final  ? Neutro Abs 01/02/2022 5.2  1.7 - 7.7 K/uL Final  ? Lymphocytes Relative 01/02/2022 31  % Final  ? Lymphs Abs 01/02/2022 2.9  0.7 - 4.0 K/uL Final  ? Monocytes Relative 01/02/2022 11  % Final  ? Monocytes Absolute 01/02/2022 1.0  0.1 - 1.0 K/uL  Final  ? Eosinophils Relative 01/02/2022 3  % Final  ? Eosinophils Absolute 01/02/2022 0.3  0.0 - 0.5 K/uL Final  ? Basophils Relative 01/02/2022 1  % Final  ? Basophils Absolute 01/02/2022 0.1  0.0 - 0.1 K/uL Final  ? Immature Granulocytes 01/02/2022 0  % Final  ? Abs Immature Granulocytes 01/02/2022 0.03  0.00 - 0.07 K/uL Final  ? Performed at [facility] at [facility], [address], [place], [place] [address]  ? Sodium 01/02/2022 140  135 - 145 mmol/L Final  ? Potassium 01/02/2022 3.3 (L)  3.5 - 5.1 mmol/L Final  ? Chloride 01/02/2022 103  98 - 111 mmol/L Final  ? CO2 01/02/2022 26  22 - 32 mmol/L Final  ? Glucose, Bld 01/02/2022 116 (H)  70 - 99 mg/dL Final  ? Glucose reference range applies only to samples taken after fasting for at least 8 hours.  ? BUN 01/02/2022 21  8 - 23 mg/dL Final  ? Creatinine 01/02/2022 1.49 (H)  0.61 - 1.24 mg/dL Final  ? Calcium 01/02/2022 9.2  8.9 - 10.3 mg/dL Final  ? Total Protein 01/02/2022 7.1  6.5 - 8.1 g/dL Final  ? Albumin 01/02/2022 4.0  3.5 - 5.0 g/dL Final  ? AST 01/02/2022 17  15 - 41 U/L Final  ? ALT 01/02/2022 18  0 - 44 U/L Final  ? Alkaline Phosphatase 01/02/2022 69  38 - 126 U/L Final  ? Total Bilirubin 01/02/2022 0.6  0.3 - 1.2 mg/dL Final  ? GFR, Estimated 01/02/2022 46 (L)  >60 mL/min Final  ? Comment: (NOTE) ?Calculated using the CKD-EPI Creatinine Equation (2021) ?  ? Anion gap 01/02/2022 11  5 - 15 Final  ? Performed at [facility] Lab at [facility], [address] [place], [place] [address]  ? Ferritin 01/02/2022 124  24 - 336 ng/mL Final  ? Performed at [facility], [address], [place], [place] [address]  ? Iron 01/02/2022 61  45 - 182 ug/dL Final  ? TIBC 01/02/2022 266  250 - 450 ug/dL Final  ? Saturation Ratios 01/02/2022 23  17.9 - 39.5 % Final  ? UIBC 01/02/2022 205  117 - 376 ug/dL Final  ? Performed at [facility], [place] [address]  [place], [place] [address]  ?  Retic Ct Pct 01/02/2022 1.5  0.4 - 3.1 % Final  ? RBC. 01/02/2022 5.26  4.22 - 5.81 MIL/uL Final  ? Retic Count, Absolute 01/02/2022 80.5  19.0 - 186.0 K/uL Final  ? Immature Retic Fract 01/02/2022 8.0  2.3 - 15.9 % Final  ? Performed at [facility] at [facility], [address], [place], [place] [address]  ? Smear Review 01/02/2022 SMEAR STAINED AND AVAILABLE FOR REVIEW   Final  ? Performed at [facility] at [facility], [address], [place], [place] [address]  ? LDH 01/02/2022 166  98 - 192 U/L Final  ? Performed at [facility] at [facility], [address] [place],  [address]  ? Erythropoietin 01/02/2022 15.2  2.6 - 18.5 mIU/mL Final  ? Comment: (NOTE) ?Beckman Coulter UniCel DxI 800 Immunoassay System ?Values obtained with different assay methods or kits cannot be used ?interchangeably. Results cannot be interpreted as absolute evidence ?of the presence or absence of malignant disease. ?Performed At: [place] ?[address] [place], [place] [id number] ?Rush Farmer MD [id number] ?  ?Admission on 12/28/2021, Discharged on 12/29/2021  ?Component Date Value Ref Range Status  ? Troponin I (High Sensitivity) 12/28/2021 8  <18 ng/L Final  ? Comment: (NOTE) ?Elevated high sensitivity troponin I (hsTnI) values and significant  ?changes across serial measurements may suggest ACS but many other  ?chronic and acute conditions are known to elevate hsTnI results.  ?Refer to the "Links" section for chest pain algorithms and additional  ?guidance. ?Performed at [facility], [place] [address] [place], [place] ?[address] ?  ? WBC 12/28/2021 14.2 (H)  4.0 - 10.5 K/uL Final  ? RBC 12/28/2021 5.89 (H)  4.22 - 5.81 MIL/uL Final  ? Hemoglobin 12/28/2021 19.6 (H)  13.0 - 17.0 g/dL Final  ? HCT 12/28/2021 57.6 (H)  39.0 - 52.0 % Final  ? MCV 12/28/2021 97.8  80.0 - 100.0 fL Final  ? MCH  12/28/2021 33.3  26.0 - 34.0 pg Final  ? MCHC 12/28/2021 34.0  30.0 - 36.0 g/dL Final  ? RDW 12/28/2021 14.0  11.5 - 15.5 % Final  ? Platelets 12/28/2021 255  150 - 400 K/uL Final  ? nRBC 12/28/2021 0.0  0.0 - 0.2 % Final  ? Neutr

## 2022-01-05 DIAGNOSIS — N5201 Erectile dysfunction due to arterial insufficiency: Secondary | ICD-10-CM | POA: Diagnosis not present

## 2022-01-05 DIAGNOSIS — R350 Frequency of micturition: Secondary | ICD-10-CM | POA: Diagnosis not present

## 2022-01-05 DIAGNOSIS — N400 Enlarged prostate without lower urinary tract symptoms: Secondary | ICD-10-CM | POA: Diagnosis not present

## 2022-01-08 ENCOUNTER — Telehealth: Payer: Self-pay | Admitting: Internal Medicine

## 2022-01-08 NOTE — Telephone Encounter (Signed)
Do not know about that therapy, we can discuss that at the next visit. ?

## 2022-01-08 NOTE — Telephone Encounter (Signed)
Patient would like a colon hydro therapy done, and would like a call back to know how to go about it.  ?

## 2022-01-09 ENCOUNTER — Other Ambulatory Visit: Payer: Self-pay | Admitting: Family

## 2022-01-09 LAB — JAK 2 V617F (GENPATH)

## 2022-01-10 DIAGNOSIS — Z20822 Contact with and (suspected) exposure to covid-19: Secondary | ICD-10-CM | POA: Diagnosis not present

## 2022-01-10 DIAGNOSIS — R059 Cough, unspecified: Secondary | ICD-10-CM | POA: Diagnosis not present

## 2022-01-10 DIAGNOSIS — R051 Acute cough: Secondary | ICD-10-CM | POA: Diagnosis not present

## 2022-01-11 ENCOUNTER — Other Ambulatory Visit: Payer: Self-pay

## 2022-01-11 ENCOUNTER — Encounter (HOSPITAL_COMMUNITY): Payer: Self-pay | Admitting: Emergency Medicine

## 2022-01-11 ENCOUNTER — Inpatient Hospital Stay (HOSPITAL_COMMUNITY)
Admission: EM | Admit: 2022-01-11 | Discharge: 2022-01-13 | DRG: 247 | Disposition: A | Discharge status: Home / Self Care | Payer: Medicare Other | Attending: Family Medicine | Admitting: Family Medicine

## 2022-01-11 ENCOUNTER — Encounter (HOSPITAL_COMMUNITY)
Admission: EM | Disposition: A | Discharge status: Home / Self Care | Payer: Self-pay | Source: Home / Self Care | Attending: Family Medicine

## 2022-01-11 ENCOUNTER — Emergency Department (HOSPITAL_COMMUNITY): Payer: Medicare Other

## 2022-01-11 ENCOUNTER — Emergency Department (HOSPITAL_BASED_OUTPATIENT_CLINIC_OR_DEPARTMENT_OTHER): Payer: Medicare Other

## 2022-01-11 DIAGNOSIS — K219 Gastro-esophageal reflux disease without esophagitis: Secondary | ICD-10-CM | POA: Diagnosis present

## 2022-01-11 DIAGNOSIS — D751 Secondary polycythemia: Secondary | ICD-10-CM | POA: Diagnosis present

## 2022-01-11 DIAGNOSIS — I10 Essential (primary) hypertension: Secondary | ICD-10-CM | POA: Diagnosis not present

## 2022-01-11 DIAGNOSIS — Z9981 Dependence on supplemental oxygen: Secondary | ICD-10-CM

## 2022-01-11 DIAGNOSIS — J9611 Chronic respiratory failure with hypoxia: Secondary | ICD-10-CM | POA: Diagnosis not present

## 2022-01-11 DIAGNOSIS — Z8249 Family history of ischemic heart disease and other diseases of the circulatory system: Secondary | ICD-10-CM | POA: Diagnosis not present

## 2022-01-11 DIAGNOSIS — E119 Type 2 diabetes mellitus without complications: Secondary | ICD-10-CM

## 2022-01-11 DIAGNOSIS — Z7984 Long term (current) use of oral hypoglycemic drugs: Secondary | ICD-10-CM

## 2022-01-11 DIAGNOSIS — R0789 Other chest pain: Secondary | ICD-10-CM | POA: Diagnosis not present

## 2022-01-11 DIAGNOSIS — R0902 Hypoxemia: Secondary | ICD-10-CM | POA: Diagnosis not present

## 2022-01-11 DIAGNOSIS — Z7982 Long term (current) use of aspirin: Secondary | ICD-10-CM

## 2022-01-11 DIAGNOSIS — Z955 Presence of coronary angioplasty implant and graft: Secondary | ICD-10-CM | POA: Diagnosis not present

## 2022-01-11 DIAGNOSIS — R0609 Other forms of dyspnea: Secondary | ICD-10-CM | POA: Diagnosis not present

## 2022-01-11 DIAGNOSIS — I2510 Atherosclerotic heart disease of native coronary artery without angina pectoris: Secondary | ICD-10-CM | POA: Diagnosis not present

## 2022-01-11 DIAGNOSIS — I25110 Atherosclerotic heart disease of native coronary artery with unstable angina pectoris: Secondary | ICD-10-CM | POA: Diagnosis not present

## 2022-01-11 DIAGNOSIS — I272 Pulmonary hypertension, unspecified: Secondary | ICD-10-CM | POA: Diagnosis not present

## 2022-01-11 DIAGNOSIS — Z79899 Other long term (current) drug therapy: Secondary | ICD-10-CM | POA: Diagnosis not present

## 2022-01-11 DIAGNOSIS — Z833 Family history of diabetes mellitus: Secondary | ICD-10-CM | POA: Diagnosis not present

## 2022-01-11 DIAGNOSIS — R079 Chest pain, unspecified: Principal | ICD-10-CM

## 2022-01-11 DIAGNOSIS — E1122 Type 2 diabetes mellitus with diabetic chronic kidney disease: Secondary | ICD-10-CM | POA: Diagnosis not present

## 2022-01-11 DIAGNOSIS — I252 Old myocardial infarction: Secondary | ICD-10-CM | POA: Diagnosis not present

## 2022-01-11 DIAGNOSIS — I200 Unstable angina: Secondary | ICD-10-CM | POA: Diagnosis present

## 2022-01-11 DIAGNOSIS — E785 Hyperlipidemia, unspecified: Secondary | ICD-10-CM | POA: Diagnosis present

## 2022-01-11 DIAGNOSIS — R059 Cough, unspecified: Secondary | ICD-10-CM | POA: Diagnosis not present

## 2022-01-11 DIAGNOSIS — J449 Chronic obstructive pulmonary disease, unspecified: Secondary | ICD-10-CM | POA: Diagnosis present

## 2022-01-11 DIAGNOSIS — R0602 Shortness of breath: Secondary | ICD-10-CM | POA: Diagnosis not present

## 2022-01-11 DIAGNOSIS — E1151 Type 2 diabetes mellitus with diabetic peripheral angiopathy without gangrene: Secondary | ICD-10-CM | POA: Diagnosis present

## 2022-01-11 DIAGNOSIS — Z87891 Personal history of nicotine dependence: Secondary | ICD-10-CM

## 2022-01-11 DIAGNOSIS — E782 Mixed hyperlipidemia: Secondary | ICD-10-CM

## 2022-01-11 DIAGNOSIS — Z7985 Long-term (current) use of injectable non-insulin antidiabetic drugs: Secondary | ICD-10-CM | POA: Diagnosis not present

## 2022-01-11 DIAGNOSIS — I25118 Atherosclerotic heart disease of native coronary artery with other forms of angina pectoris: Secondary | ICD-10-CM | POA: Diagnosis not present

## 2022-01-11 HISTORY — PX: RIGHT/LEFT HEART CATH AND CORONARY ANGIOGRAPHY: CATH118266

## 2022-01-11 LAB — POCT I-STAT EG7
Acid-Base Excess: 1 mmol/L (ref 0.0–2.0)
Acid-Base Excess: 2 mmol/L (ref 0.0–2.0)
Bicarbonate: 25.8 mmol/L (ref 20.0–28.0)
Bicarbonate: 26.2 mmol/L (ref 20.0–28.0)
Calcium, Ion: 1.24 mmol/L (ref 1.15–1.40)
Calcium, Ion: 1.25 mmol/L (ref 1.15–1.40)
HCT: 48 % (ref 39.0–52.0)
HCT: 49 % (ref 39.0–52.0)
Hemoglobin: 16.3 g/dL (ref 13.0–17.0)
Hemoglobin: 16.7 g/dL (ref 13.0–17.0)
O2 Saturation: 64 %
O2 Saturation: 66 %
Potassium: 3.4 mmol/L — ABNORMAL LOW (ref 3.5–5.1)
Potassium: 3.4 mmol/L — ABNORMAL LOW (ref 3.5–5.1)
Sodium: 142 mmol/L (ref 135–145)
Sodium: 143 mmol/L (ref 135–145)
TCO2: 27 mmol/L (ref 22–32)
TCO2: 27 mmol/L (ref 22–32)
pCO2, Ven: 40.5 mmHg — ABNORMAL LOW (ref 44–60)
pCO2, Ven: 40.6 mmHg — ABNORMAL LOW (ref 44–60)
pH, Ven: 7.411 (ref 7.25–7.43)
pH, Ven: 7.419 (ref 7.25–7.43)
pO2, Ven: 33 mmHg (ref 32–45)
pO2, Ven: 34 mmHg (ref 32–45)

## 2022-01-11 LAB — ECHOCARDIOGRAM COMPLETE
Area-P 1/2: 3.83 cm2
Height: 74 in
P 1/2 time: 764 msec
S' Lateral: 3.2 cm
Weight: 3668.45 oz

## 2022-01-11 LAB — COMPREHENSIVE METABOLIC PANEL WITH GFR
ALT: 24 U/L (ref 0–44)
AST: 37 U/L (ref 15–41)
Albumin: 3.4 g/dL — ABNORMAL LOW (ref 3.5–5.0)
Alkaline Phosphatase: 54 U/L (ref 38–126)
Anion gap: 8 (ref 5–15)
BUN: 18 mg/dL (ref 8–23)
CO2: 22 mmol/L (ref 22–32)
Calcium: 8.7 mg/dL — ABNORMAL LOW (ref 8.9–10.3)
Chloride: 108 mmol/L (ref 98–111)
Creatinine, Ser: 1.09 mg/dL (ref 0.61–1.24)
GFR, Estimated: >60 mL/min
Glucose, Bld: 136 mg/dL — ABNORMAL HIGH (ref 70–99)
Potassium: 4.9 mmol/L (ref 3.5–5.1)
Sodium: 138 mmol/L (ref 135–145)
Total Bilirubin: 0.8 mg/dL (ref 0.3–1.2)
Total Protein: 6.4 g/dL — ABNORMAL LOW (ref 6.5–8.1)

## 2022-01-11 LAB — POCT I-STAT 7, (LYTES, BLD GAS, ICA,H+H)
Acid-Base Excess: 2 mmol/L (ref 0.0–2.0)
Bicarbonate: 25.5 mmol/L (ref 20.0–28.0)
Calcium, Ion: 1.26 mmol/L (ref 1.15–1.40)
HCT: 47 % (ref 39.0–52.0)
Hemoglobin: 16 g/dL (ref 13.0–17.0)
O2 Saturation: 89 %
Potassium: 3.4 mmol/L — ABNORMAL LOW (ref 3.5–5.1)
Sodium: 142 mmol/L (ref 135–145)
TCO2: 27 mmol/L (ref 22–32)
pCO2 arterial: 36.9 mmHg (ref 32–48)
pH, Arterial: 7.447 (ref 7.35–7.45)
pO2, Arterial: 54 mmHg — ABNORMAL LOW (ref 83–108)

## 2022-01-11 LAB — I-STAT VENOUS BLOOD GAS, ED
Acid-Base Excess: 3 mmol/L — ABNORMAL HIGH (ref 0.0–2.0)
Bicarbonate: 26.4 mmol/L (ref 20.0–28.0)
Calcium, Ion: 1.02 mmol/L — ABNORMAL LOW (ref 1.15–1.40)
HCT: 53 % — ABNORMAL HIGH (ref 39.0–52.0)
Hemoglobin: 18 g/dL — ABNORMAL HIGH (ref 13.0–17.0)
O2 Saturation: 79 %
Potassium: 6.2 mmol/L — ABNORMAL HIGH (ref 3.5–5.1)
Sodium: 138 mmol/L (ref 135–145)
TCO2: 28 mmol/L (ref 22–32)
pCO2, Ven: 37.6 mmHg — ABNORMAL LOW (ref 44–60)
pH, Ven: 7.455 — ABNORMAL HIGH (ref 7.25–7.43)
pO2, Ven: 41 mmHg (ref 32–45)

## 2022-01-11 LAB — CBC WITH DIFFERENTIAL/PLATELET
Abs Immature Granulocytes: 0.03 10*3/uL (ref 0.00–0.07)
Basophils Absolute: 0.1 10*3/uL (ref 0.0–0.1)
Basophils Relative: 1 %
Eosinophils Absolute: 0.3 10*3/uL (ref 0.0–0.5)
Eosinophils Relative: 3 %
HCT: 52.9 % — ABNORMAL HIGH (ref 39.0–52.0)
Hemoglobin: 17.9 g/dL — ABNORMAL HIGH (ref 13.0–17.0)
Immature Granulocytes: 0 %
Lymphocytes Relative: 32 %
Lymphs Abs: 2.8 10*3/uL (ref 0.7–4.0)
MCH: 33.1 pg (ref 26.0–34.0)
MCHC: 33.8 g/dL (ref 30.0–36.0)
MCV: 97.8 fL (ref 80.0–100.0)
Monocytes Absolute: 0.8 10*3/uL (ref 0.1–1.0)
Monocytes Relative: 9 %
Neutro Abs: 4.9 10*3/uL (ref 1.7–7.7)
Neutrophils Relative %: 55 %
Platelets: 248 10*3/uL (ref 150–400)
RBC: 5.41 MIL/uL (ref 4.22–5.81)
RDW: 14.4 % (ref 11.5–15.5)
WBC: 8.9 10*3/uL (ref 4.0–10.5)
nRBC: 0 % (ref 0.0–0.2)

## 2022-01-11 LAB — GLUCOSE, CAPILLARY
Glucose-Capillary: 113 mg/dL — ABNORMAL HIGH (ref 70–99)
Glucose-Capillary: 155 mg/dL — ABNORMAL HIGH (ref 70–99)

## 2022-01-11 LAB — TROPONIN I (HIGH SENSITIVITY)
Troponin I (High Sensitivity): 10 ng/L (ref <18)
Troponin I (High Sensitivity): 12 ng/L (ref <18)

## 2022-01-11 LAB — BRAIN NATRIURETIC PEPTIDE: B Natriuretic Peptide: 209.6 pg/mL — ABNORMAL HIGH (ref 0.0–100.0)

## 2022-01-11 LAB — D-DIMER, QUANTITATIVE: D-Dimer, Quant: 0.69 ug/mL-FEU — ABNORMAL HIGH (ref 0.00–0.50)

## 2022-01-11 SURGERY — RIGHT/LEFT HEART CATH AND CORONARY ANGIOGRAPHY
Anesthesia: LOCAL

## 2022-01-11 MED ORDER — SODIUM CHLORIDE 0.9 % IV SOLN: INTRAVENOUS | Status: AC

## 2022-01-11 MED ORDER — ASPIRIN 81 MG PO CHEW: 81.0000 mg | CHEWABLE_TABLET | ORAL | Status: DC

## 2022-01-11 MED ORDER — SODIUM CHLORIDE 0.9% FLUSH: 3.0000 mL | INTRAVENOUS | Status: DC | PRN

## 2022-01-11 MED ORDER — SODIUM CHLORIDE 0.9 % IV SOLN: 250.0000 mL | INTRAVENOUS | Status: DC | PRN

## 2022-01-11 MED ORDER — SODIUM CHLORIDE 0.9 % WEIGHT BASED INFUSION: 1.0000 mL/kg/h | INTRAVENOUS | Status: DC

## 2022-01-11 MED ORDER — GABAPENTIN 100 MG PO CAPS
100.0000 mg | ORAL_CAPSULE | Freq: Every day | ORAL | Status: DC
  Administered 2022-01-11 – 2022-01-13 (×3): 100 mg via ORAL
  Filled 2022-01-11 (×3): qty 1

## 2022-01-11 MED ORDER — ACETAMINOPHEN 325 MG PO TABS: 650.0000 mg | ORAL_TABLET | ORAL | Status: DC | PRN

## 2022-01-11 MED ORDER — VERAPAMIL HCL 2.5 MG/ML IV SOLN
INTRAVENOUS | Status: AC
  Filled 2022-01-11: qty 2

## 2022-01-11 MED ORDER — METOPROLOL SUCCINATE ER 100 MG PO TB24
100.0000 mg | ORAL_TABLET | Freq: Every day | ORAL | Status: DC
  Administered 2022-01-11 – 2022-01-13 (×3): 100 mg via ORAL
  Filled 2022-01-11 (×3): qty 1

## 2022-01-11 MED ORDER — HEPARIN (PORCINE) IN NACL 1000-0.9 UT/500ML-% IV SOLN
INTRAVENOUS | Status: AC
  Filled 2022-01-11: qty 500

## 2022-01-11 MED ORDER — HEPARIN (PORCINE) IN NACL 1000-0.9 UT/500ML-% IV SOLN
INTRAVENOUS | Status: DC | PRN
  Administered 2022-01-11 (×2): 500 mL

## 2022-01-11 MED ORDER — HEPARIN SODIUM (PORCINE) 1000 UNIT/ML IJ SOLN
INTRAMUSCULAR | Status: AC
  Filled 2022-01-11: qty 10

## 2022-01-11 MED ORDER — SODIUM CHLORIDE 0.9 % WEIGHT BASED INFUSION: 3.0000 mL/kg/h | INTRAVENOUS | Status: DC

## 2022-01-11 MED ORDER — LABETALOL HCL 5 MG/ML IV SOLN: 10.0000 mg | INTRAVENOUS | Status: AC | PRN

## 2022-01-11 MED ORDER — AMLODIPINE BESYLATE 5 MG PO TABS
5.0000 mg | ORAL_TABLET | Freq: Every day | ORAL | Status: DC
  Administered 2022-01-11 – 2022-01-13 (×3): 5 mg via ORAL
  Filled 2022-01-11 (×3): qty 1

## 2022-01-11 MED ORDER — INSULIN ASPART 100 UNIT/ML IJ SOLN
0.0000 [IU] | Freq: Three times a day (TID) | INTRAMUSCULAR | Status: DC
  Administered 2022-01-12: 2 [IU] via SUBCUTANEOUS
  Administered 2022-01-13: 3 [IU] via SUBCUTANEOUS

## 2022-01-11 MED ORDER — ACETAMINOPHEN 650 MG RE SUPP: 650.0000 mg | Freq: Four times a day (QID) | RECTAL | Status: DC | PRN

## 2022-01-11 MED ORDER — SODIUM CHLORIDE 0.9% FLUSH
3.0000 mL | Freq: Two times a day (BID) | INTRAVENOUS | Status: DC
  Administered 2022-01-11 (×2): 3 mL via INTRAVENOUS

## 2022-01-11 MED ORDER — FAMOTIDINE 20 MG PO TABS: 20.0000 mg | ORAL_TABLET | Freq: Two times a day (BID) | ORAL | Status: DC | PRN

## 2022-01-11 MED ORDER — SODIUM CHLORIDE 0.9% FLUSH
3.0000 mL | Freq: Two times a day (BID) | INTRAVENOUS | Status: DC
  Administered 2022-01-11: 3 mL via INTRAVENOUS

## 2022-01-11 MED ORDER — ASPIRIN 81 MG PO CHEW
81.0000 mg | CHEWABLE_TABLET | Freq: Every day | ORAL | Status: DC
  Administered 2022-01-12 – 2022-01-13 (×2): 81 mg via ORAL
  Filled 2022-01-11 (×3): qty 1

## 2022-01-11 MED ORDER — VERAPAMIL HCL 2.5 MG/ML IV SOLN
INTRAVENOUS | Status: DC | PRN
  Administered 2022-01-11: 10 mL via INTRA_ARTERIAL

## 2022-01-11 MED ORDER — EMPAGLIFLOZIN 10 MG PO TABS
10.0000 mg | ORAL_TABLET | Freq: Every day | ORAL | Status: DC
  Administered 2022-01-12 – 2022-01-13 (×2): 10 mg via ORAL
  Filled 2022-01-11 (×2): qty 1

## 2022-01-11 MED ORDER — ALBUTEROL SULFATE (2.5 MG/3ML) 0.083% IN NEBU
2.5000 mg | INHALATION_SOLUTION | Freq: Four times a day (QID) | RESPIRATORY_TRACT | Status: DC | PRN
  Administered 2022-01-13 (×2): 2.5 mg via RESPIRATORY_TRACT
  Filled 2022-01-11 (×2): qty 3

## 2022-01-11 MED ORDER — HYDRALAZINE HCL 20 MG/ML IJ SOLN: 10.0000 mg | INTRAMUSCULAR | Status: AC | PRN

## 2022-01-11 MED ORDER — ASPIRIN 81 MG PO TABS: 81.0000 mg | ORAL_TABLET | Freq: Every day | ORAL | Status: DC

## 2022-01-11 MED ORDER — ONDANSETRON HCL 4 MG/2ML IJ SOLN: 4.0000 mg | Freq: Four times a day (QID) | INTRAMUSCULAR | Status: DC | PRN

## 2022-01-11 MED ORDER — IPRATROPIUM-ALBUTEROL 0.5-2.5 (3) MG/3ML IN SOLN
3.0000 mL | Freq: Four times a day (QID) | RESPIRATORY_TRACT | Status: AC
Start: 2022-01-11 — End: 2022-01-12
  Administered 2022-01-11: 3 mL via RESPIRATORY_TRACT
  Filled 2022-01-11 (×2): qty 3

## 2022-01-11 MED ORDER — SODIUM CHLORIDE 0.9 % IV SOLN
INTRAVENOUS | Status: AC | PRN
  Administered 2022-01-11: 10 mL/h via INTRAVENOUS

## 2022-01-11 MED ORDER — HEPARIN SODIUM (PORCINE) 1000 UNIT/ML IJ SOLN
INTRAMUSCULAR | Status: DC | PRN
  Administered 2022-01-11: 5000 [IU] via INTRAVENOUS

## 2022-01-11 MED ORDER — ACETAMINOPHEN 325 MG PO TABS: 650.0000 mg | ORAL_TABLET | Freq: Four times a day (QID) | ORAL | Status: DC | PRN

## 2022-01-11 MED ORDER — IOHEXOL 350 MG/ML SOLN
INTRAVENOUS | Status: DC | PRN
  Administered 2022-01-11: 70 mL

## 2022-01-11 MED ORDER — LIDOCAINE HCL (PF) 1 % IJ SOLN
INTRAMUSCULAR | Status: AC
  Filled 2022-01-11: qty 30

## 2022-01-11 MED ORDER — ASPIRIN 81 MG PO CHEW
81.0000 mg | CHEWABLE_TABLET | ORAL | Status: AC
  Administered 2022-01-11: 81 mg via ORAL

## 2022-01-11 MED ORDER — LIDOCAINE HCL (PF) 1 % IJ SOLN
INTRAMUSCULAR | Status: DC | PRN
  Administered 2022-01-11 (×3): 2 mL

## 2022-01-11 MED ORDER — ALBUTEROL SULFATE HFA 108 (90 BASE) MCG/ACT IN AERS
2.0000 | INHALATION_SPRAY | Freq: Once | RESPIRATORY_TRACT | Status: AC
  Administered 2022-01-11: 2 via RESPIRATORY_TRACT
  Filled 2022-01-11: qty 6.7

## 2022-01-11 MED ORDER — SIMVASTATIN 20 MG PO TABS
20.0000 mg | ORAL_TABLET | Freq: Every day | ORAL | Status: DC
  Administered 2022-01-11 – 2022-01-13 (×3): 20 mg via ORAL
  Filled 2022-01-11 (×3): qty 1

## 2022-01-11 SURGICAL SUPPLY — 14 items
CATH 5FR JL3.5 JR4 ANG PIG MP (CATHETERS) ×1 IMPLANT
CATH 5FR JL4 DIAGNOSTIC (CATHETERS) ×1 IMPLANT
CATH BALLN WEDGE 5F 110CM (CATHETERS) ×1 IMPLANT
DEVICE RAD COMP TR BAND LRG (VASCULAR PRODUCTS) ×1 IMPLANT
GLIDESHEATH SLEND A-KIT 6F 22G (SHEATH) ×1 IMPLANT
GUIDEWIRE .025 260CM (WIRE) ×1 IMPLANT
GUIDEWIRE INQWIRE 1.5J.035X260 (WIRE) IMPLANT
INQWIRE 1.5J .035X260CM (WIRE) ×2
KIT HEART LEFT (KITS) ×2 IMPLANT
PACK CARDIAC CATHETERIZATION (CUSTOM PROCEDURE TRAY) ×2 IMPLANT
SHEATH GLIDE SLENDER 4/5FR (SHEATH) ×2 IMPLANT
SHEATH PROBE COVER 6X72 (BAG) ×1 IMPLANT
TRANSDUCER W/STOPCOCK (MISCELLANEOUS) ×2 IMPLANT
TUBING CIL FLEX 10 FLL-RA (TUBING) ×2 IMPLANT

## 2022-01-11 NOTE — Assessment & Plan Note (Signed)
Stable with no signs of exertion ?Initially was hypoxic with EMS during possible unstable anginal event, but has been maintaining oxygen on room air since being in ED ?Continue inhalers and home oxygen at night and prn with exertion  ?

## 2022-01-11 NOTE — Interval H&P Note (Signed)
Cath Lab Visit (complete for each Cath Lab visit) ? ?Clinical Evaluation Leading to the Procedure:  ? ?ACS: Yes.   ? ?Non-ACS:   ? ?Anginal Classification: CCS [name] ? ?Anti-ischemic medical therapy: Minimal Therapy (1 class of medications) ? ?Non-Invasive Test Results: No non-invasive testing performed ? ?Prior CABG: No previous CABG ? ? ? ? ? ?History and Physical Interval Note: ? ?01/11/2022 ?3:23 PM ? ?Jonathan [name].  has presented today for surgery, with the diagnosis of chest pain.  The various methods of treatment have been discussed with the patient and family. After consideration of risks, benefits and other options for treatment, the patient has consented to  Procedure(s): ?RIGHT/LEFT HEART CATH AND CORONARY ANGIOGRAPHY (N/A) as a surgical intervention.  The patient's history has been reviewed, patient examined, no change in status, stable for surgery.  I have reviewed the patient's chart and labs.  Questions were answered to the patient's satisfaction.   ? ? ?Jonathan [name] ? ? ?

## 2022-01-11 NOTE — Assessment & Plan Note (Signed)
Followed by hematology: Dr. Marin Olp. Has hx of erythrocytosis going back over 10 years. He has been diagnosed with secondary polycythemia due to COPD. He is also not compliant wearing his supplemental O2 at night.  ?Hgb and Hct are stable at this time. Have avoided phlebotomy to avoid hypoxia  ?No abnormality or evidence of malignancy noted on smear  ?Recent JAK2 lab abnormal, has f/u with oncology  ?? ?

## 2022-01-11 NOTE — CV Procedure (Addendum)
Severe three-vessel coronary disease with significant proximal RCA with in an upward angulated segment. ?Severe mid LAD in an upward angulated segment. ?Ostial 70% and distal 70% circumflex. ?Normal LVEDP of 15 mmHg. ?Needs 2D Doppler echocardiogram. ?Discussed with primary team, Dr. Johnsie Cancel.  Patient is not likely a surgical candidate.  Comorbidities would likely place him a very high risk for poor outcome. ?I discussed PCI with the patient.  He wanted to discuss it with his family.  Son is driving down from [place].  Wife is 100% dependent upon him as she has dementia.  We need to have conversation to decide how to move forward. ?Unable to reach wife or sister on the numbers listed. ?

## 2022-01-11 NOTE — ED Triage Notes (Signed)
Pt arrives from home via EMS with complaints of exertional SHOB. Pt was walking from home to garage and became very [facility], felt unsteady and weak and felt as though he was going to pass out. When fire department arrived patient was 80% on room air. Placed on 15 L NRB initially but weaned to 3 L [place] and maintaining 96% Spo2. Pt endorses new cough this AM. No complaints of chest pain, n/v/d. Recent visit with hem/onc who took blood work and instructed patient to start wearing O2 at night. Last time he needed to do this was 1 year ago.  ?VS per EMS  ? ?BP-137/72  ?Resp 18 ?96% on 3 L [place]  ?CBG 148  ?

## 2022-01-11 NOTE — Assessment & Plan Note (Signed)
Continue zocor 

## 2022-01-11 NOTE — Assessment & Plan Note (Addendum)
Very well controlled. Continue metoprolol, cozaar, maxzide and norvasc  ?Will hold ARB/maxzide today with cath. Resume tomorrow  ?

## 2022-01-11 NOTE — Assessment & Plan Note (Addendum)
chronic issues per pulmonology notes.  ?LHC/RCH planned as mentioned. R/o pulmonary HTN as contributing cause ?CXR with chronic bronchitis. Continue inhalers. No signs of exacerbation  ?bnp mildly elevated and likely from chronic copd, no signs of heart failure ?D-dimer wnl with age adjustment  ?Continue 2L oxygen at night and with exertion prn (not faithful doing this)  ?

## 2022-01-11 NOTE — Consult Note (Signed)
CARDIOLOGY CONSULT NOTE  ? ? ? ? ? ?Patient ID: ?Jonathan [name]. ?MRN: [id number] ?DOB/AGE: 1936-05-20 86 y.o. ? ?Admit date: 01/11/2022 ?Referring Physician: Margarita Mail ?Primary Physician: Colon Branch, MD ?Primary Cardiologist: Gwenlyn Found ?Reason for Consultation: Chest pain/Dyspnea/Pre syncope ? ?Principal Problem: ?  Unstable angina ([place]) ? ? ?HPI:  86 y.o. retired Designer, multimedia with Distant CAD 2003 stent to RCA, PVD previous smoker with severe COPD followed by Dr Annamaria Boots He wears oxygen 2l at night. Quit smoking after 60 pack years in the 1970's. He has type 2 DM followed by Dr Dwyane Dee PVD with previous left SFA stent followed by Dr Gwenlyn Found with last ABI on left 0.9.  He was walking to garage and had severe dyspnea, pre syncope and tightness in his chest. EMS noted hypoxia in 80% range Never been this SOB before despite COPD diagnosis No calf pain claudication or new LE edema age adjusted d dimer ok 0.69 CXR brochitis no CHF or infiltrate He has chronic polycythemia from hypoxia with Hct 57.6 two weeks ago and today 52.9 BNP only  209 and troponin negative ECG SR with no acute changes Since being in ER feels better with good sats on 2-3L's  ? ?ROS ?All other systems reviewed and negative except as noted above ? ?Past Medical History:  ?Diagnosis Date  ? CAD (coronary artery disease)   ? Two RCA stents remotely / 3rd RCA stent 2006  ? Colon polyps   ? s/p several Cscopes.  ? COPD (chronic obstructive pulmonary disease) ([place])   ? on O2, nocturnal  ? Diabetes mellitus   ? dx aprox 2009  ? ED (erectile dysfunction)   ? has a vacumm device  ? Ejection fraction   ? Hyperlipidemia   ? dx in 90s  ? Hypertension   ? dx in the 90  ? Hypogonadism male   ? PVD (peripheral vascular disease) ([place])   ? s/p stents at LE 2009, Dr Einar Gip  ? Shortness of breath   ? O2 Sat dropped to 82% walking on the treadmill, September, 2012  ?  ?Family History  ?Problem Relation Age of Onset  ? Heart disease Father   ? Hypertension Father   ? Stroke Father    ? Diabetes Paternal Aunt   ? Diabetes Maternal Grandmother   ? Diabetes Other   ?     GM, nephews, many family members  ? Hyperlipidemia Other   ?     ?  ? Prostate cancer Brother   ? Colon cancer Neg Hx   ?  ?Social History  ? ?Socioeconomic History  ? Marital status: Married  ?  Spouse name: Cerrella   ? Number of children: 6  ? Years of education: Not on file  ? Highest education level: Not on file  ?Occupational History  ? Occupation: retired, still preaches   ?  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 Smoking status: Former  ?  Packs/day: 2.00  ?  Years: 30.00  ?  Pack years: 60.00  ?  Types: Cigarettes  ?  Quit date: 06/17/1977  ?  Years since quitting: 44.6  ? Smokeless tobacco: Never  ? Tobacco comments:  ?  2 ppd, quit 1978  ?Vaping Use  ? Vaping Use: Never used  ?Substance and Sexual Activity  ? Alcohol use: Yes  ?  Alcohol/week: 0.0 standard drinks  ?  Comment: socially   ? Drug use: No  ? Sexual activity: Yes  ?Other Topics  Concern  ? Not on file  ?Social History Narrative  ? 4 children (lost 1 son)  ? Wife has 2 children  ?    ?   ?   ?   ? ?Social Determinants of Health  ? ?Financial Resource Strain: Low Risk   ? Difficulty of Paying Living Expenses: Not hard at all  ?Food Insecurity: No Food Insecurity  ? Worried About Running Out of Food in the Last Year: Never true  ? Ran Out of Food in the Last Year: Never true  ?Transportation Needs: No Transportation Needs  ? Lack of Transportation (Medical): No  ? Lack of Transportation (Non-Medical): No  ?Physical Activity: Inactive  ? Days of Exercise per Week: 0 days  ? Minutes of Exercise per Session: 0 min  ?Stress: No Stress Concern Present  ? Feeling of Stress : Not at all  ?Social Connections: Socially Integrated  ? Frequency of Communication with Friends and Family: More than three times a week  ? Frequency of Social Gatherings with Friends and Family: More than three times a week  ? Attends Religious Services: More than 4 times per year  ? Active  Member of Clubs or Organizations: Yes  ? Attends Club or Organization Meetings: More than 4 times per year  ? Marital Status: Married  ?Intimate Partner Violence: Not At Risk  ? Fear of Current or Ex-Partner: No  ? Emotionally Abused: No  ? Physically Abused: No  ? Sexually Abused: No  ?  ?Past Surgical History:  ?Procedure Laterality Date  ? APPENDECTOMY    ? TONSILLECTOMY    ?  ? ? ?Current Facility-Administered Medications:  ?  sodium chloride flush (NS) 0.9 % injection 3 mL, 3 mL, Intravenous, Q12H, Nola Botkins C, MD ? ?Current Outpatient Medications:  ?  albuterol (VENTOLIN HFA) 108 (90 Base) MCG/ACT inhaler, Inhale 1-2 puffs into the lungs every 6 (six) hours as needed for wheezing or shortness of breath., Disp: , Rfl:  ?  amLODipine (NORVASC) 5 MG tablet, Take 1 tablet (5 mg total) by mouth daily., Disp: 90 tablet, Rfl: 3 ?  aspirin 81 MG tablet, Take 81 mg by mouth daily., Disp: , Rfl:  ?  Blood Glucose Monitoring Suppl (FREESTYLE FREEDOM LITE) w/Device KIT, Use Freestyle Freedom Lite meter to check blood sugar twice daily. DX:E11.65, Disp: 1 kit, Rfl: 0 ?  empagliflozin (JARDIANCE) 10 MG TABS tablet, Take 10 mg by mouth daily., Disp: , Rfl:  ?  famotidine (PEPCID) 20 MG tablet, Take 20 mg by mouth 2 (two) times daily as needed for heartburn or indigestion., Disp: , Rfl:  ?  gabapentin (NEURONTIN) 100 MG capsule, Take 100 mg by mouth daily., Disp: , Rfl:  ?  glucose blood (FREESTYLE LITE) test strip, Use as instructed, Disp: 100 each, Rfl: 6 ?  Lancets (FREESTYLE) lancets, USE AS INSTRUCTED TO CHECK BLOOD SUGAR TWICE A DAY, Disp: 200 each, Rfl: 1 ?  losartan (COZAAR) 100 MG tablet, Take 100 mg by mouth daily., Disp: , Rfl:  ?  metFORMIN (GLUCOPHAGE) 1000 MG tablet, Take 500 mg by mouth 2 (two) times daily with a meal., Disp: , Rfl:  ?  metoprolol succinate (TOPROL XL) 100 MG 24 hr tablet, Take 1 tablet (100 mg total) by mouth daily., Disp: 90 tablet, Rfl: 2 ?  Multiple Vitamin (MULTIVITAMIN WITH MINERALS)  TABS tablet, Take 1 tablet by mouth daily., Disp: , Rfl:  ?  simvastatin (ZOCOR) 20 MG tablet, Take 20 mg by mouth daily.,   Disp: , Rfl:  ?  triamterene-hydrochlorothiazide (MAXZIDE-25) 37.5-25 MG tablet, Take 1 tablet by mouth daily. , Disp: , Rfl:  ?  TRULICITY 3 XK/4.8JE SOPN, INJECT 3 MG UNDER THE SKIN ONCE A WEEK (Patient taking differently: Inject 3 mg into the skin every Saturday.), Disp: 6 mL, Rfl: 3 ? sodium chloride flush  3 mL Intravenous Q12H  ? ? ? ?Physical Exam: ?Blood pressure 131/69, pulse 63, temperature (!) 97.3 ?F (36.3 ?C), temperature source Oral, resp. rate 18, height 6' 2" (1.88 m), weight 104 kg, SpO2 95 %.  ? ?Obese black male ?No active wheezing  ?Normal heart sounds ?Abdomen benign  ?Palpable DP/PT bilaterally  ?No calf pain or swelling ?Good right radial pulse  ? ?Labs: ?  ?Lab Results  ?Component Value Date  ? WBC 8.9 01/11/2022  ? HGB 18.0 (H) 01/11/2022  ? HCT 53.0 (H) 01/11/2022  ? MCV 97.8 01/11/2022  ? PLT 248 01/11/2022  ?  ?Recent Labs  ?Lab 01/11/22 ?5631 01/11/22 ?0957  ?NA 138 138  ?K 4.9 6.2*  ?CL 108  --   ?CO2 22  --   ?BUN 18  --   ?CREATININE 1.09  --   ?CALCIUM 8.7*  --   ?PROT 6.4*  --   ?BILITOT 0.8  --   ?ALKPHOS 54  --   ?ALT 24  --   ?AST 37  --   ?GLUCOSE 136*  --   ? ?Lab Results  ?Component Value Date  ? TROPONINI <0.03 03/05/2019  ?  ?Lab Results  ?Component Value Date  ? CHOL 127 06/27/2021  ? CHOL 141 02/14/2021  ? CHOL 117 02/24/2020  ? ?Lab Results  ?Component Value Date  ? HDL 42.30 06/27/2021  ? HDL 38.30 (L) 02/14/2021  ? HDL 37.00 (L) 02/24/2020  ? ?Lab Results  ?Component Value Date  ? [place] 63 06/27/2021  ? [place] 64 02/14/2021  ? [place] 63 02/24/2020  ? ?Lab Results  ?Component Value Date  ? TRIG 107.0 06/27/2021  ? TRIG 195.0 (H) 02/14/2021  ? TRIG 87.0 02/24/2020  ? ?Lab Results  ?Component Value Date  ? CHOLHDL 3 06/27/2021  ? CHOLHDL 4 02/14/2021  ? CHOLHDL 3 02/24/2020  ? ?Lab Results  ?Component Value Date  ? LDLDIRECT 55.0 01/01/2018  ?  ?   ?Radiology: ?DG Chest 2 View ? ?Result Date: 12/28/2021 ?CLINICAL DATA:  Chest pain EXAM: CHEST - 2 VIEW COMPARISON:  08/03/2021 FINDINGS: Mild bronchitic changes. Streaky atelectasis at the bases. No cons

## 2022-01-11 NOTE — Assessment & Plan Note (Signed)
A1C of 7.2 in 10/2021 ?Hold metformin and trulicity ?Continue jardiance and start SSI with routine accuchecks  ? ?

## 2022-01-11 NOTE — Assessment & Plan Note (Signed)
86 year old male presenting with sudden onset dyspnea, dizziness, presyncope and hypoxia that resolved after 20 minutes. concerning of unstable angina in setting of known CAD with remote stenting of RCA x2 and 3rd stent placed to RCA in 2006.  ?-obs to telemetry ?-troponin wnl x2 and no acute changes on ekg ?-work up overall with no significant findings ?-history concerning for unstable angina, cardiology consulted with plans for [facility] today to assess arteries and pressures in setting of copd ?-will check orthostatics ?-echo ordered ?-NPO now and SCDs for cath  ?-check fasting lipid panel in AM ?-continue ASA, zocor and metoprolol. Holding ARB/HCTZ today with LHC.  ? ?

## 2022-01-11 NOTE — Progress Notes (Signed)
Echocardiogram ?2D Echocardiogram has been performed. ? Jonathan [name] ?01/11/2022, 2:40 PM ?

## 2022-01-11 NOTE — ED Provider Notes (Signed)
**Jonathan [name] De-Identified via Obfuscation** ?Jonathan ?Provider Jonathan [name] ? ? ?CSN: [id number] ?Arrival date & time: 01/11/22  0854 ? ?  ? ?History ? ?Chief Complaint  ?Patient presents with  ? Shortness of Breath  ? ? ?Jonathan Jonathan [name]. is a 86 y.o. male who has a pmh of PVD, CAD, CKD, diabetes, GERD, hypertension, hyperlipidemia, chronic resp failure with hypoxia.  Patient reports that he was going to mow his grass and was walking through his attached garage when he had onset of severe shortness of breath,.  Patient sat down and states that he felt like he was going to pass out.  He felt "warm all over."  Patient states "I really felt like I was going out of this world."  He called his sister and 911.  EMS reports that when fire arrived they found him hypoxic into the 80s.  He was placed on 15 L and then transition to 3 L via nasal cannula.  Patient reports that he was recently told that he needs to "use oxygen at night."  He has a history of previous MI and is status post PCI with stent.  He has had no exertional dyspnea prior to today's event.  He states he is never had anything like this before.  Patient currently on room air with oxygen saturations above 90%.  He denies unilateral leg swelling, history of PE or DVT, orthopnea or PND. He deneis chest pain, racing or skipping, palpitations. ? ? ? ?Shortness of Breath ? ?  ? ?Home Medications ?Prior to Admission medications   ?Medication Sig Start Date End Date Taking? Authorizing Provider  ?albuterol (VENTOLIN HFA) 108 (90 Base) MCG/ACT inhaler Inhale 1-2 puffs into the lungs every 6 (six) hours as needed for wheezing or shortness of breath.   Yes [provider]  ?amLODipine (NORVASC) 5 MG tablet Take 1 tablet (5 mg total) by mouth daily. 06/28/20  Yes Colon Branch, MD  ?aspirin 81 MG tablet Take 81 mg by mouth daily.   Yes [provider]  ?Blood Glucose Monitoring Suppl (FREESTYLE FREEDOM LITE) w/Device KIT Use Freestyle Freedom Lite meter to check  blood sugar twice daily. DX:E11.65 11/17/21  Yes Elayne Snare, MD  ?empagliflozin (JARDIANCE) 10 MG TABS tablet Take 10 mg by mouth daily.   Yes [provider]  ?famotidine (PEPCID) 20 MG tablet Take 20 mg by mouth 2 (two) times daily as needed for heartburn or indigestion.   Yes [provider]  ?gabapentin (NEURONTIN) 100 MG capsule Take 100 mg by mouth daily.   Yes [provider]  ?glucose blood (FREESTYLE LITE) test strip Use as instructed 10/26/21  Yes Elayne Snare, MD  ?Lancets (FREESTYLE) lancets USE AS INSTRUCTED TO CHECK BLOOD SUGAR TWICE A DAY 01/20/18  Yes Elayne Snare, MD  ?losartan (COZAAR) 100 MG tablet Take 100 mg by mouth daily.   Yes [provider]  ?metFORMIN (GLUCOPHAGE) 1000 MG tablet Take 500 mg by mouth 2 (two) times daily with a meal.   Yes [provider]  ?metoprolol succinate (TOPROL XL) 100 MG 24 hr tablet Take 1 tablet (100 mg total) by mouth daily. 07/28/18  Yes Paz, Alda Berthold, MD  ?Multiple Vitamin (MULTIVITAMIN WITH MINERALS) TABS tablet Take 1 tablet by mouth daily.   Yes [provider]  ?simvastatin (ZOCOR) 20 MG tablet Take 20 mg by mouth daily.   Yes [provider]  ?triamterene-hydrochlorothiazide (MAXZIDE-25) 37.5-25 MG tablet Take 1 tablet by mouth daily.  08/15/17  Yes Paz, Alda Berthold, MD  ?TRULICITY 3 DJ/5.7SV SOPN INJECT 3 MG UNDER THE SKIN ONCE A WEEK ?Patient taking differently: Inject 3 mg into the skin every Saturday. 12/16/20  Yes Elayne Snare, MD  ?   ? ?Allergies    ?Patient has no known allergies.   ? ?Review of Systems   ?Review of Systems  ?Respiratory:  Positive for shortness of breath.   ? ?Physical Exam ?Updated Vital Signs ?BP (!) 152/81   Pulse 80   Temp (!) 97.3 ?F (36.3 ?C) (Oral)   Resp (!) 24   Ht '6\' 2"'  (1.88 m)   Wt 104 kg   SpO2 93%   BMI 29.44 kg/m?  ?Physical Exam ?Vitals and nursing Jonathan [name] reviewed.  ?Constitutional:   ?   General: He is not in acute distress. ?   Appearance: He is well-developed.  He is not diaphoretic.  ?HENT:  ?   Head: Normocephalic and atraumatic.  ?Eyes:  ?   General: No scleral icterus. ?   Conjunctiva/sclera: Conjunctivae normal.  ?Cardiovascular:  ?   Rate and Rhythm: Normal rate and regular rhythm.  ?   Heart sounds: Normal heart sounds.  ?Pulmonary:  ?   Effort: Pulmonary effort is normal. No respiratory distress.  ?   Breath sounds: Normal breath sounds. No wheezing, rhonchi or rales.  ?   Comments: Patient gets mildly labored with extensive talking. ?Abdominal:  ?   Palpations: Abdomen is soft.  ?   Tenderness: There is no abdominal tenderness.  ?Musculoskeletal:  ?   Cervical back: Normal range of motion and neck supple.  ?Skin: ?   General: Skin is warm and dry.  ?Neurological:  ?   Mental Status: He is alert.  ?Psychiatric:     ?   Behavior: Behavior normal.  ? ? ?ED Results / Procedures / Treatments   ?Labs ?(all labs ordered are listed, but only abnormal results are displayed) ?Labs Reviewed  ?COMPREHENSIVE METABOLIC PANEL - Abnormal; Notable for the following components:  ?    Result Value  ? Glucose, Bld 136 (*)   ? Calcium 8.7 (*)   ? Total Protein 6.4 (*)   ? Albumin 3.4 (*)   ? All other components within normal limits  ?CBC WITH DIFFERENTIAL/PLATELET - Abnormal; Notable for the following components:  ? Hemoglobin 17.9 (*)   ? HCT 52.9 (*)   ? All other components within normal limits  ?BRAIN NATRIURETIC PEPTIDE - Abnormal; Notable for the following components:  ? B Natriuretic Peptide 209.6 (*)   ? All other components within normal limits  ?D-DIMER, QUANTITATIVE - Abnormal; Notable for the following components:  ? D-Dimer, Quant 0.69 (*)   ? All other components within normal limits  ?I-STAT VENOUS BLOOD GAS, ED - Abnormal; Notable for the following components:  ? pH, Ven 7.455 (*)   ? pCO2, Ven 37.6 (*)   ? Acid-Base Excess 3.0 (*)   ? Potassium 6.2 (*)   ? Calcium, Ion 1.02 (*)   ? HCT 53.0 (*)   ? Hemoglobin 18.0 (*)   ? All other components within normal limits   ?TROPONIN I (HIGH SENSITIVITY)  ?TROPONIN I (HIGH SENSITIVITY)  ? ? ?EKG ?EKG Interpretation ? ?Date/Time:  Thursday January 11 2022 10:10:41 EDT ?Ventricular Rate:  67 ?PR Interval:  253 ?QRS Duration: 74 ?QT Interval:  396 ?QTC Calculation: 418 ?R Axis:   8 ?Text Interpretation: Sinus rhythm Prolonged PR interval Anteroseptal infarct, age indeterminate similar to earlier  in the day Confirmed by Sherwood Gambler [id number]) on 01/11/2022 10:19:48 AM ? ?Radiology ?DG Chest Port 1 View ? ?Result Date: 01/11/2022 ?CLINICAL DATA:  Shortness of breath EXAM: PORTABLE CHEST 1 VIEW COMPARISON:  Chest radiograph dated December 28, 2021 FINDINGS: The heart is normal in size. Aorta is tortuous with atherosclerotic calcifications. Prominence of bilateral bronchovascular markings concerning for reactive bronchitis or reactive airway disease. No focal consolidation or large pleural effusion. IMPRESSION: 1. Prominence of the bronchovascular markings suggesting bronchitis or reactive airway disease, unchanged. 2. No focal consolidation or large pleural effusion. Electronically Signed   By: Keane Police D.O.   On: 01/11/2022 10:02  ? ?ECHOCARDIOGRAM COMPLETE ? ?Result Date: 01/11/2022 ?   ECHOCARDIOGRAM REPORT   Patient Name:   Jonathan Jonathan [name]. Date of Exam: 01/11/2022 Medical Rec #:  [id number]           Height:       74.0 in Accession #:    [id number]          Weight:       229.3 lb Date of Birth:  March 06, 1936            BSA:          2.303 m? Patient Age:    62 years            BP:           130/78 mmHg Patient Gender: M                   HR:           71 bpm. Exam Location:  Inpatient Procedure: 3D Echo, 2D Echo, Cardiac Doppler, Color Doppler and Strain Analysis Indications:    Chest Pain R07.9  History:        Patient has prior history of Echocardiogram examinations, most                 recent 12/16/2013. CAD, COPD and PAD; Risk Factors:Former Smoker,                 Hypertension, Diabetes and Dyslipidemia.  Sonographer:    Darlina Sicilian  RDCS Referring Phys: [place]  1. Left ventricular ejection fraction, by estimation, is 55 to 60%. The left ventricle has normal function. The left ventricle has no regional wall motion abn

## 2022-01-11 NOTE — H&P (View-Only) (Signed)
CARDIOLOGY CONSULT NOTE  ? ? ? ? ? ?Patient ID: ?Jonathan [name]. ?MRN: [id number] ?DOB/AGE: 1936-05-20 86 y.o. ? ?Admit date: 01/11/2022 ?Referring Physician: Margarita Mail ?Primary Physician: Colon Branch, MD ?Primary Cardiologist: Gwenlyn Found ?Reason for Consultation: Chest pain/Dyspnea/Pre syncope ? ?Principal Problem: ?  Unstable angina ([place]) ? ? ?HPI:  86 y.o. retired Designer, multimedia with Distant CAD 2003 stent to RCA, PVD previous smoker with severe COPD followed by Dr Annamaria Boots He wears oxygen 2l at night. Quit smoking after 60 pack years in the 1970's. He has type 2 DM followed by Dr Dwyane Dee PVD with previous left SFA stent followed by Dr Gwenlyn Found with last ABI on left 0.9.  He was walking to garage and had severe dyspnea, pre syncope and tightness in his chest. EMS noted hypoxia in 80% range Never been this SOB before despite COPD diagnosis No calf pain claudication or new LE edema age adjusted d dimer ok 0.69 CXR brochitis no CHF or infiltrate He has chronic polycythemia from hypoxia with Hct 57.6 two weeks ago and today 52.9 BNP only  209 and troponin negative ECG SR with no acute changes Since being in ER feels better with good sats on 2-3L's  ? ?ROS ?All other systems reviewed and negative except as noted above ? ?Past Medical History:  ?Diagnosis Date  ? CAD (coronary artery disease)   ? Two RCA stents remotely / 3rd RCA stent 2006  ? Colon polyps   ? s/p several Cscopes.  ? COPD (chronic obstructive pulmonary disease) ([place])   ? on O2, nocturnal  ? Diabetes mellitus   ? dx aprox 2009  ? ED (erectile dysfunction)   ? has a vacumm device  ? Ejection fraction   ? Hyperlipidemia   ? dx in 90s  ? Hypertension   ? dx in the 90  ? Hypogonadism male   ? PVD (peripheral vascular disease) ([place])   ? s/p stents at [place] 2009, Dr Einar Gip  ? Shortness of breath   ? O2 Sat dropped to 82% walking on the treadmill, September, 2012  ?  ?Family History  ?Problem Relation Age of Onset  ? Heart disease Father   ? Hypertension Father   ? Stroke Father    ? Diabetes Paternal Aunt   ? Diabetes Maternal Grandmother   ? Diabetes Other   ?     GM, nephews, many family members  ? Hyperlipidemia Other   ?     ?  ? Prostate cancer Brother   ? Colon cancer Neg Hx   ?  ?Social History  ? ?Socioeconomic History  ? Marital status: Married  ?  Spouse name: Cerrella   ? Number of children: 6  ? Years of education: Not on file  ? Highest education level: Not on file  ?Occupational History  ? Occupation: retired, still preaches   ?  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 Smoking status: Former  ?  Packs/day: 2.00  ?  Years: 30.00  ?  Pack years: 60.00  ?  Types: Cigarettes  ?  Quit date: 06/17/1977  ?  Years since quitting: 44.6  ? Smokeless tobacco: Never  ? Tobacco comments:  ?  2 ppd, quit 1978  ?Vaping Use  ? Vaping Use: Never used  ?Substance and Sexual Activity  ? Alcohol use: Yes  ?  Alcohol/week: 0.0 standard drinks  ?  Comment: socially   ? Drug use: No  ? Sexual activity: Yes  ?Other Topics  Concern  ? Not on file  ?Social History Narrative  ? 4 children (lost 1 son)  ? Wife has 2 children  ?    ?   ?   ?   ? ?Social Determinants of Health  ? ?Financial Resource Strain: Low Risk   ? Difficulty of Paying Living Expenses: Not hard at all  ?Food Insecurity: No Food Insecurity  ? Worried About Charity fundraiser in the Last Year: Never true  ? Ran Out of Food in the Last Year: Never true  ?Transportation Needs: No Transportation Needs  ? Lack of Transportation (Medical): No  ? Lack of Transportation (Non-Medical): No  ?Physical Activity: Inactive  ? Days of Exercise per Week: 0 days  ? Minutes of Exercise per Session: 0 min  ?Stress: No Stress Concern Present  ? Feeling of Stress : Not at all  ?Social Connections: Socially Integrated  ? Frequency of Communication with Friends and Family: More than three times a week  ? Frequency of Social Gatherings with Friends and Family: More than three times a week  ? Attends Religious Services: More than 4 times per year  ? Active  Member of Clubs or Organizations: Yes  ? Attends Archivist Meetings: More than 4 times per year  ? Marital Status: Married  ?Intimate Partner Violence: Not At Risk  ? Fear of Current or Ex-Partner: No  ? Emotionally Abused: No  ? Physically Abused: No  ? Sexually Abused: No  ?  ?Past Surgical History:  ?Procedure Laterality Date  ? APPENDECTOMY    ? TONSILLECTOMY    ?  ? ? ?Current Facility-Administered Medications:  ?  sodium chloride flush (NS) 0.9 % injection 3 mL, 3 mL, Intravenous, Q12H, Josue Hector, MD ? ?Current Outpatient Medications:  ?  albuterol (VENTOLIN HFA) 108 (90 Base) MCG/ACT inhaler, Inhale 1-2 puffs into the lungs every 6 (six) hours as needed for wheezing or shortness of breath., Disp: , Rfl:  ?  amLODipine (NORVASC) 5 MG tablet, Take 1 tablet (5 mg total) by mouth daily., Disp: 90 tablet, Rfl: 3 ?  aspirin 81 MG tablet, Take 81 mg by mouth daily., Disp: , Rfl:  ?  Blood Glucose Monitoring Suppl (FREESTYLE FREEDOM LITE) w/Device KIT, Use Freestyle Freedom Lite meter to check blood sugar twice daily. DX:E11.65, Disp: 1 kit, Rfl: 0 ?  empagliflozin (JARDIANCE) 10 MG TABS tablet, Take 10 mg by mouth daily., Disp: , Rfl:  ?  famotidine (PEPCID) 20 MG tablet, Take 20 mg by mouth 2 (two) times daily as needed for heartburn or indigestion., Disp: , Rfl:  ?  gabapentin (NEURONTIN) 100 MG capsule, Take 100 mg by mouth daily., Disp: , Rfl:  ?  glucose blood (FREESTYLE LITE) test strip, Use as instructed, Disp: 100 each, Rfl: 6 ?  Lancets (FREESTYLE) lancets, USE AS INSTRUCTED TO CHECK BLOOD SUGAR TWICE A DAY, Disp: 200 each, Rfl: 1 ?  losartan (COZAAR) 100 MG tablet, Take 100 mg by mouth daily., Disp: , Rfl:  ?  metFORMIN (GLUCOPHAGE) 1000 MG tablet, Take 500 mg by mouth 2 (two) times daily with a meal., Disp: , Rfl:  ?  metoprolol succinate (TOPROL XL) 100 MG 24 hr tablet, Take 1 tablet (100 mg total) by mouth daily., Disp: 90 tablet, Rfl: 2 ?  Multiple Vitamin (MULTIVITAMIN WITH MINERALS)  TABS tablet, Take 1 tablet by mouth daily., Disp: , Rfl:  ?  simvastatin (ZOCOR) 20 MG tablet, Take 20 mg by mouth daily.,  Disp: , Rfl:  ?  triamterene-hydrochlorothiazide (MAXZIDE-25) 37.5-25 MG tablet, Take 1 tablet by mouth daily. , Disp: , Rfl:  ?  TRULICITY 3 XK/4.8JE SOPN, INJECT 3 MG UNDER THE SKIN ONCE A WEEK (Patient taking differently: Inject 3 mg into the skin every Saturday.), Disp: 6 mL, Rfl: 3 ? sodium chloride flush  3 mL Intravenous Q12H  ? ? ? ?Physical Exam: ?Blood pressure 131/69, pulse 63, temperature (!) 97.3 ?F (36.3 ?C), temperature source Oral, resp. rate 18, height 6' 2" (1.88 m), weight 104 kg, SpO2 95 %.  ? ?Obese black male ?No active wheezing  ?Normal heart sounds ?Abdomen benign  ?Palpable DP/PT bilaterally  ?No calf pain or swelling ?Good right radial pulse  ? ?Labs: ?  ?Lab Results  ?Component Value Date  ? WBC 8.9 01/11/2022  ? HGB 18.0 (H) 01/11/2022  ? HCT 53.0 (H) 01/11/2022  ? MCV 97.8 01/11/2022  ? PLT 248 01/11/2022  ?  ?Recent Labs  ?Lab 01/11/22 ?5631 01/11/22 ?0957  ?NA 138 138  ?K 4.9 6.2*  ?CL 108  --   ?CO2 22  --   ?BUN 18  --   ?CREATININE 1.09  --   ?CALCIUM 8.7*  --   ?PROT 6.4*  --   ?BILITOT 0.8  --   ?ALKPHOS 54  --   ?ALT 24  --   ?AST 37  --   ?GLUCOSE 136*  --   ? ?Lab Results  ?Component Value Date  ? TROPONINI <0.03 03/05/2019  ?  ?Lab Results  ?Component Value Date  ? CHOL 127 06/27/2021  ? CHOL 141 02/14/2021  ? CHOL 117 02/24/2020  ? ?Lab Results  ?Component Value Date  ? HDL 42.30 06/27/2021  ? HDL 38.30 (L) 02/14/2021  ? HDL 37.00 (L) 02/24/2020  ? ?Lab Results  ?Component Value Date  ? [place] 63 06/27/2021  ? [place] 64 02/14/2021  ? [place] 63 02/24/2020  ? ?Lab Results  ?Component Value Date  ? TRIG 107.0 06/27/2021  ? TRIG 195.0 (H) 02/14/2021  ? TRIG 87.0 02/24/2020  ? ?Lab Results  ?Component Value Date  ? CHOLHDL 3 06/27/2021  ? CHOLHDL 4 02/14/2021  ? CHOLHDL 3 02/24/2020  ? ?Lab Results  ?Component Value Date  ? LDLDIRECT 55.0 01/01/2018  ?  ?   ?Radiology: ?DG Chest 2 View ? ?Result Date: 12/28/2021 ?CLINICAL DATA:  Chest pain EXAM: CHEST - 2 VIEW COMPARISON:  08/03/2021 FINDINGS: Mild bronchitic changes. Streaky atelectasis at the bases. No cons

## 2022-01-11 NOTE — H&P (Signed)
?History and Physical  ? ? ?Patient: Jonathan [name]. TZG:[id number] DOB: March 08, 1936 ?DOA: 01/11/2022 ?DOS: the patient was seen and examined on 01/11/2022 ?PCP: Colon Branch, MD  ?Patient coming from: Home - lives with wife who has dementia  ? ? ?Chief Complaint: shortness of breath/presyncopal event  ? ?HPI: Jonathan [name]. is a 86 y.o. male with medical history significant of hx of CAD remote stenting of RCA x2 and 3rd stent to RCA in 2006, PVD with left SFA stent in 2009, HTN, COPD on nocturnal oxygen, T2DM, HLD who came into ED with complaints of shortness of breath and pre syncopal event. He walked to his detached garage and had sudden shortness of breath, dizzy, and feeling like he was going to pass out.  He states this lasted for 20 minutes. He called EMS. They found him hypoxic in the 80s and he was placed on 15L>3LNC. Was taken off oxygen in ED and has had no oxygen requirement.  He chornically wears 2L oxygen at night. He also has a mild intermittent dry cough.  ? ? ?He has been feeling good before this AM. Denies any fever/chills, vision changes/headaches, chest pain or palpitation, abdominal pain, N/V/D, dysuria or leg swelling.  ? ?Past history of smoking, no alcohol ? ? ?ER Course:  vitals: afebrile, bp: 140/77, HR: 67, RR: 16 , oxygen 95% on 3L [place]  ?Pertinent labs: hgb: 17.9, bnp: 209, venous BG: ph: 7.455, oxygen: 41  ?CXR: prominence of bronchovascular markings suggesting bronchitis or reactive airway disease, unchanged.  ?In ED: cardiology consulted. Given albuterol. Dr. Johnsie Cancel ordered echo and will see him.  ? ? ?Review of Systems: As mentioned in the history of present illness. All other systems reviewed and are negative. ?Past Medical History:  ?Diagnosis Date  ? CAD (coronary artery disease)   ? Two RCA stents remotely / 3rd RCA stent 2006  ? Colon polyps   ? s/p several Cscopes.  ? COPD (chronic obstructive pulmonary disease) ([place])   ? on O2, nocturnal  ? Diabetes mellitus   ? dx aprox 2009   ? ED (erectile dysfunction)   ? has a vacumm device  ? Ejection fraction   ? Hyperlipidemia   ? dx in 90s  ? Hypertension   ? dx in the 90  ? Hypogonadism male   ? PVD (peripheral vascular disease) ([place])   ? s/p stents at [facility] 2009, Dr Einar Gip  ? Shortness of breath   ? O2 Sat dropped to 82% walking on the treadmill, September, 2012  ? ?Past Surgical History:  ?Procedure Laterality Date  ? APPENDECTOMY    ? TONSILLECTOMY    ? ?Social History:  reports that he quit smoking about 44 years ago. His smoking use included cigarettes. He has a 60.00 pack-year smoking history. He has never used smokeless tobacco. He reports current alcohol use. He reports that he does not use drugs. ? ?No Known Allergies ? ?Family History  ?Problem Relation Age of Onset  ? Heart disease Father   ? Hypertension Father   ? Stroke Father   ? Diabetes Paternal Aunt   ? Diabetes Maternal Grandmother   ? Diabetes Other   ?     GM, nephews, many family members  ? Hyperlipidemia Other   ?     ?  ? Prostate cancer Brother   ? Colon cancer Neg Hx   ? ? ?Prior to Admission medications   ?Medication Sig Start Date End Date Taking? Authorizing  Provider  ?albuterol (VENTOLIN HFA) 108 (90 Base) MCG/ACT inhaler Inhale 1-2 puffs into the lungs every 6 (six) hours as needed for wheezing or shortness of breath.   Yes [provider]  ?amLODipine (NORVASC) 5 MG tablet Take 1 tablet (5 mg total) by mouth daily. 06/28/20  Yes Colon Branch, MD  ?aspirin 81 MG tablet Take 81 mg by mouth daily.   Yes [provider]  ?Blood Glucose Monitoring Suppl (FREESTYLE FREEDOM LITE) w/Device KIT Use Freestyle Freedom Lite meter to check blood sugar twice daily. DX:E11.65 11/17/21  Yes Elayne Snare, MD  ?empagliflozin (JARDIANCE) 10 MG TABS tablet Take 10 mg by mouth daily.   Yes [provider]  ?famotidine (PEPCID) 20 MG tablet Take 20 mg by mouth 2 (two) times daily as needed for heartburn or indigestion.   Yes [provider]  ?gabapentin  (NEURONTIN) 100 MG capsule Take 100 mg by mouth daily.   Yes [provider]  ?glucose blood (FREESTYLE LITE) test strip Use as instructed 10/26/21  Yes Elayne Snare, MD  ?Lancets (FREESTYLE) lancets USE AS INSTRUCTED TO CHECK BLOOD SUGAR TWICE A DAY 01/20/18  Yes Elayne Snare, MD  ?losartan (COZAAR) 100 MG tablet Take 100 mg by mouth daily.   Yes [provider]  ?metFORMIN (GLUCOPHAGE) 1000 MG tablet Take 500 mg by mouth 2 (two) times daily with a meal.   Yes [provider]  ?metoprolol succinate (TOPROL XL) 100 MG 24 hr tablet Take 1 tablet (100 mg total) by mouth daily. 07/28/18  Yes Paz, Alda Berthold, MD  ?Multiple Vitamin (MULTIVITAMIN WITH MINERALS) TABS tablet Take 1 tablet by mouth daily.   Yes [provider]  ?simvastatin (ZOCOR) 20 MG tablet Take 20 mg by mouth daily.   Yes [provider]  ?triamterene-hydrochlorothiazide (MAXZIDE-25) 37.5-25 MG tablet Take 1 tablet by mouth daily.  08/15/17  Yes Paz, Alda Berthold, MD  ?TRULICITY 3 IO/0.3TD SOPN INJECT 3 MG UNDER THE SKIN ONCE A WEEK ?Patient taking differently: Inject 3 mg into the skin every Saturday. 12/16/20  Yes Elayne Snare, MD  ? ? ?Physical Exam: ?Vitals:  ? 01/11/22 1445 01/11/22 1500 01/11/22 1520 01/11/22 1540  ?BP: (!) 148/77 (!) 184/91    ?Pulse: 68 74    ?Resp: 13 19    ?Temp:      ?TempSrc:      ?SpO2: 94% 100% 94% 96%  ?Weight:      ?Height:      ? ?General:  Appears calm and comfortable and is in NAD ?Eyes:  PERRL, EOMI, normal lids, iris ?ENT:  grossly normal hearing, lips & tongue, mmm; appropriate dentition ?Neck:  no LAD, masses or thyromegaly; no carotid bruits, no JVD ?Cardiovascular:  RRR, no m/r/g. Trace LE edema.  ?Respiratory:   faint bibasilar crackles, but good air movement, no wheezing or increased work of breathing.  Normal respiratory effort. ?Abdomen:  soft, NT, ND, NABS ?Back:   normal alignment, no CVAT ?Skin:  no rash or induration seen on limited exam ?Musculoskeletal:  grossly normal tone  BUE/BLE, good ROM, no bony abnormality ?Lower extremity:  trace LE edema.  Limited foot exam with no ulcerations.  2+ distal pulses. ?Psychiatric:  grossly normal mood and affect, speech fluent and appropriate, AOx3 ?Neurologic:  CN 2-12 grossly intact, moves all extremities in coordinated fashion, sensation intact ? ? ?Radiological Exams on Admission: ?Independently reviewed - see discussion in A/P where applicable ? ?DG Chest Port 1 View ? ?Result Date: 01/11/2022 ?  CLINICAL DATA:  Shortness of breath EXAM: PORTABLE CHEST 1 VIEW COMPARISON:  Chest radiograph dated December 28, 2021 FINDINGS: The heart is normal in size. Aorta is tortuous with atherosclerotic calcifications. Prominence of bilateral bronchovascular markings concerning for reactive bronchitis or reactive airway disease. No focal consolidation or large pleural effusion. IMPRESSION: 1. Prominence of the bronchovascular markings suggesting bronchitis or reactive airway disease, unchanged. 2. No focal consolidation or large pleural effusion. Electronically Signed   By: Keane Police D.O.   On: 01/11/2022 10:02  ? ?ECHOCARDIOGRAM COMPLETE ? ?Result Date: 01/11/2022 ?   ECHOCARDIOGRAM REPORT   Patient Name:   Jonathan [name]. Date of Exam: 01/11/2022 Medical Rec #:  [id number]           Height:       74.0 in Accession #:    [id number]          Weight:       229.3 lb Date of Birth:  [birth year]            BSA:          2.303 m? Patient Age:    18 years            BP:           130/78 mmHg Patient Gender: M                   HR:           71 bpm. Exam Location:  Inpatient Procedure: 3D Echo, 2D Echo, Cardiac Doppler, Color Doppler and Strain Analysis Indications:    Chest Pain R07.9  History:        Patient has prior history of Echocardiogram examinations, most                 recent 12/16/2013. CAD, COPD and PAD; Risk Factors:Former Smoker,                 Hypertension, Diabetes and Dyslipidemia.  Sonographer:    Darlina Sicilian RDCS Referring Phys: [place]  1. Left ventricular ejection fraction, by estimation, is 55 to 60%. The left ventricle has normal function. The left ventricle has no regional wall motion abnormalities. There is mild left ve

## 2022-01-12 ENCOUNTER — Encounter (HOSPITAL_COMMUNITY): Payer: Self-pay | Admitting: Interventional Cardiology

## 2022-01-12 ENCOUNTER — Telehealth: Payer: Self-pay | Admitting: Family

## 2022-01-12 ENCOUNTER — Inpatient Hospital Stay (HOSPITAL_COMMUNITY)
Admission: EM | Disposition: A | Discharge status: Home / Self Care | Payer: Self-pay | Source: Home / Self Care | Attending: Family Medicine

## 2022-01-12 DIAGNOSIS — Z7982 Long term (current) use of aspirin: Secondary | ICD-10-CM | POA: Diagnosis not present

## 2022-01-12 DIAGNOSIS — I25110 Atherosclerotic heart disease of native coronary artery with unstable angina pectoris: Secondary | ICD-10-CM | POA: Diagnosis present

## 2022-01-12 DIAGNOSIS — Z8249 Family history of ischemic heart disease and other diseases of the circulatory system: Secondary | ICD-10-CM | POA: Diagnosis not present

## 2022-01-12 DIAGNOSIS — Z9981 Dependence on supplemental oxygen: Secondary | ICD-10-CM | POA: Diagnosis not present

## 2022-01-12 DIAGNOSIS — I200 Unstable angina: Secondary | ICD-10-CM | POA: Diagnosis present

## 2022-01-12 DIAGNOSIS — Z87891 Personal history of nicotine dependence: Secondary | ICD-10-CM | POA: Diagnosis not present

## 2022-01-12 DIAGNOSIS — Z833 Family history of diabetes mellitus: Secondary | ICD-10-CM | POA: Diagnosis not present

## 2022-01-12 DIAGNOSIS — Z7985 Long-term (current) use of injectable non-insulin antidiabetic drugs: Secondary | ICD-10-CM | POA: Diagnosis not present

## 2022-01-12 DIAGNOSIS — I25118 Atherosclerotic heart disease of native coronary artery with other forms of angina pectoris: Secondary | ICD-10-CM

## 2022-01-12 DIAGNOSIS — E782 Mixed hyperlipidemia: Secondary | ICD-10-CM | POA: Diagnosis not present

## 2022-01-12 DIAGNOSIS — J9611 Chronic respiratory failure with hypoxia: Secondary | ICD-10-CM | POA: Diagnosis present

## 2022-01-12 DIAGNOSIS — I252 Old myocardial infarction: Secondary | ICD-10-CM | POA: Diagnosis not present

## 2022-01-12 DIAGNOSIS — D751 Secondary polycythemia: Secondary | ICD-10-CM | POA: Diagnosis present

## 2022-01-12 DIAGNOSIS — E785 Hyperlipidemia, unspecified: Secondary | ICD-10-CM | POA: Diagnosis present

## 2022-01-12 DIAGNOSIS — K219 Gastro-esophageal reflux disease without esophagitis: Secondary | ICD-10-CM | POA: Diagnosis present

## 2022-01-12 DIAGNOSIS — Z7984 Long term (current) use of oral hypoglycemic drugs: Secondary | ICD-10-CM | POA: Diagnosis not present

## 2022-01-12 DIAGNOSIS — R0609 Other forms of dyspnea: Secondary | ICD-10-CM | POA: Diagnosis not present

## 2022-01-12 DIAGNOSIS — I10 Essential (primary) hypertension: Secondary | ICD-10-CM | POA: Diagnosis present

## 2022-01-12 DIAGNOSIS — E1122 Type 2 diabetes mellitus with diabetic chronic kidney disease: Secondary | ICD-10-CM | POA: Diagnosis present

## 2022-01-12 DIAGNOSIS — I272 Pulmonary hypertension, unspecified: Secondary | ICD-10-CM | POA: Diagnosis present

## 2022-01-12 DIAGNOSIS — Z955 Presence of coronary angioplasty implant and graft: Secondary | ICD-10-CM | POA: Diagnosis not present

## 2022-01-12 DIAGNOSIS — E1151 Type 2 diabetes mellitus with diabetic peripheral angiopathy without gangrene: Secondary | ICD-10-CM | POA: Diagnosis present

## 2022-01-12 DIAGNOSIS — Z79899 Other long term (current) drug therapy: Secondary | ICD-10-CM | POA: Diagnosis not present

## 2022-01-12 DIAGNOSIS — J449 Chronic obstructive pulmonary disease, unspecified: Secondary | ICD-10-CM | POA: Diagnosis present

## 2022-01-12 HISTORY — PX: CORONARY STENT INTERVENTION: CATH118234

## 2022-01-12 LAB — CBC
HCT: 47.4 % (ref 39.0–52.0)
Hemoglobin: 15.8 g/dL (ref 13.0–17.0)
MCH: 32.5 pg (ref 26.0–34.0)
MCHC: 33.3 g/dL (ref 30.0–36.0)
MCV: 97.5 fL (ref 80.0–100.0)
Platelets: 217 10*3/uL (ref 150–400)
RBC: 4.86 MIL/uL (ref 4.22–5.81)
RDW: 14.5 % (ref 11.5–15.5)
WBC: 7.9 10*3/uL (ref 4.0–10.5)
nRBC: 0 % (ref 0.0–0.2)

## 2022-01-12 LAB — COMPREHENSIVE METABOLIC PANEL
ALT: 22 U/L (ref 0–44)
AST: 20 U/L (ref 15–41)
Albumin: 2.9 g/dL — ABNORMAL LOW (ref 3.5–5.0)
Alkaline Phosphatase: 40 U/L (ref 38–126)
Anion gap: 9 (ref 5–15)
BUN: 11 mg/dL (ref 8–23)
CO2: 19 mmol/L — ABNORMAL LOW (ref 22–32)
Calcium: 8.2 mg/dL — ABNORMAL LOW (ref 8.9–10.3)
Chloride: 108 mmol/L (ref 98–111)
Creatinine, Ser: 1.13 mg/dL (ref 0.61–1.24)
GFR, Estimated: >60 mL/min (ref >60)
Glucose, Bld: 131 mg/dL — ABNORMAL HIGH (ref 70–99)
Potassium: 3.7 mmol/L (ref 3.5–5.1)
Sodium: 136 mmol/L (ref 135–145)
Total Bilirubin: 0.5 mg/dL (ref 0.3–1.2)
Total Protein: 5.5 g/dL — ABNORMAL LOW (ref 6.5–8.1)

## 2022-01-12 LAB — GLUCOSE, CAPILLARY
Glucose-Capillary: 143 mg/dL — ABNORMAL HIGH (ref 70–99)
Glucose-Capillary: 101 mg/dL — ABNORMAL HIGH (ref 70–99)
Glucose-Capillary: 116 mg/dL — ABNORMAL HIGH (ref 70–99)
Glucose-Capillary: 149 mg/dL — ABNORMAL HIGH (ref 70–99)

## 2022-01-12 LAB — LIPID PANEL
Cholesterol: 117 mg/dL (ref 0–200)
HDL: 36 mg/dL — ABNORMAL LOW (ref >40)
LDL Cholesterol: 65 mg/dL (ref 0–99)
Total CHOL/HDL Ratio: 3.3 RATIO
Triglycerides: 81 mg/dL (ref <150)
VLDL: 16 mg/dL (ref 0–40)

## 2022-01-12 LAB — POCT ACTIVATED CLOTTING TIME
Activated Clotting Time: 323 seconds
Activated Clotting Time: >1000 seconds
Activated Clotting Time: 600 seconds

## 2022-01-12 SURGERY — CORONARY STENT INTERVENTION
Anesthesia: LOCAL

## 2022-01-12 MED ORDER — SODIUM CHLORIDE 0.9 % WEIGHT BASED INFUSION: 1.0000 mL/kg/h | INTRAVENOUS | Status: DC

## 2022-01-12 MED ORDER — HEPARIN SODIUM (PORCINE) 1000 UNIT/ML IJ SOLN
INTRAMUSCULAR | Status: DC | PRN
  Administered 2022-01-12: 10000 [IU] via INTRAVENOUS

## 2022-01-12 MED ORDER — SODIUM CHLORIDE 0.9 % WEIGHT BASED INFUSION: 1.0000 mL/kg/h | INTRAVENOUS | Status: AC

## 2022-01-12 MED ORDER — NITROGLYCERIN 1 MG/10 ML FOR IR/CATH LAB
INTRA_ARTERIAL | Status: AC
  Filled 2022-01-12: qty 10

## 2022-01-12 MED ORDER — SODIUM CHLORIDE 0.9% FLUSH: 3.0000 mL | Freq: Two times a day (BID) | INTRAVENOUS | Status: DC

## 2022-01-12 MED ORDER — HEPARIN (PORCINE) IN NACL 1000-0.9 UT/500ML-% IV SOLN
INTRAVENOUS | Status: AC
  Filled 2022-01-12: qty 1000

## 2022-01-12 MED ORDER — SODIUM CHLORIDE 0.9 % WEIGHT BASED INFUSION: 3.0000 mL/kg/h | INTRAVENOUS | Status: DC

## 2022-01-12 MED ORDER — HYDRALAZINE HCL 20 MG/ML IJ SOLN: 10.0000 mg | INTRAMUSCULAR | Status: AC | PRN

## 2022-01-12 MED ORDER — LIDOCAINE HCL (PF) 1 % IJ SOLN
INTRAMUSCULAR | Status: AC
  Filled 2022-01-12: qty 30

## 2022-01-12 MED ORDER — SODIUM CHLORIDE 0.9% FLUSH
3.0000 mL | Freq: Two times a day (BID) | INTRAVENOUS | Status: DC
  Administered 2022-01-13: 3 mL via INTRAVENOUS

## 2022-01-12 MED ORDER — SODIUM CHLORIDE 0.9% FLUSH: 3.0000 mL | INTRAVENOUS | Status: DC | PRN

## 2022-01-12 MED ORDER — HEPARIN (PORCINE) IN NACL 1000-0.9 UT/500ML-% IV SOLN
INTRAVENOUS | Status: DC | PRN
  Administered 2022-01-12 (×2): 500 mL

## 2022-01-12 MED ORDER — ASPIRIN 81 MG PO CHEW: 81.0000 mg | CHEWABLE_TABLET | ORAL | Status: DC

## 2022-01-12 MED ORDER — SODIUM CHLORIDE 0.9 % IV SOLN
INTRAVENOUS | Status: AC | PRN
  Administered 2022-01-12: 10 mL/h via INTRAVENOUS

## 2022-01-12 MED ORDER — HEPARIN SODIUM (PORCINE) 1000 UNIT/ML IJ SOLN
INTRAMUSCULAR | Status: AC
  Filled 2022-01-12: qty 10

## 2022-01-12 MED ORDER — FENTANYL CITRATE (PF) 100 MCG/2ML IJ SOLN
INTRAMUSCULAR | Status: DC | PRN
  Administered 2022-01-12 (×2): 25 ug via INTRAVENOUS

## 2022-01-12 MED ORDER — LIDOCAINE HCL (PF) 1 % IJ SOLN
INTRAMUSCULAR | Status: DC | PRN
  Administered 2022-01-12: 5 mL

## 2022-01-12 MED ORDER — SODIUM CHLORIDE 0.9 % WEIGHT BASED INFUSION
3.0000 mL/kg/h | INTRAVENOUS | Status: DC
  Administered 2022-01-12: 3 mL/kg/h via INTRAVENOUS

## 2022-01-12 MED ORDER — CLOPIDOGREL BISULFATE 75 MG PO TABS
600.0000 mg | ORAL_TABLET | Freq: Once | ORAL | Status: AC
  Administered 2022-01-12: 600 mg via ORAL
  Filled 2022-01-12: qty 8

## 2022-01-12 MED ORDER — MIDAZOLAM HCL 2 MG/2ML IJ SOLN
INTRAMUSCULAR | Status: DC | PRN
  Administered 2022-01-12 (×2): 1 mg via INTRAVENOUS

## 2022-01-12 MED ORDER — VERAPAMIL HCL 2.5 MG/ML IV SOLN
INTRAVENOUS | Status: DC | PRN
  Administered 2022-01-12: 10 mL via INTRA_ARTERIAL

## 2022-01-12 MED ORDER — VERAPAMIL HCL 2.5 MG/ML IV SOLN
INTRAVENOUS | Status: AC
  Filled 2022-01-12: qty 2

## 2022-01-12 MED ORDER — SODIUM CHLORIDE 0.9 % IV SOLN: 250.0000 mL | INTRAVENOUS | Status: DC | PRN

## 2022-01-12 MED ORDER — IOHEXOL 350 MG/ML SOLN
INTRAVENOUS | Status: DC | PRN
Start: 2022-01-12 — End: 2022-01-12
  Administered 2022-01-12: 130 mL

## 2022-01-12 MED ORDER — FENTANYL CITRATE (PF) 100 MCG/2ML IJ SOLN
INTRAMUSCULAR | Status: AC
  Filled 2022-01-12: qty 2

## 2022-01-12 MED ORDER — MIDAZOLAM HCL 2 MG/2ML IJ SOLN
INTRAMUSCULAR | Status: AC
  Filled 2022-01-12: qty 2

## 2022-01-12 MED ORDER — ENOXAPARIN SODIUM 40 MG/0.4ML IJ SOSY
40.0000 mg | PREFILLED_SYRINGE | INTRAMUSCULAR | Status: DC
  Filled 2022-01-12: qty 0.4

## 2022-01-12 MED ORDER — HEPARIN (PORCINE) IN NACL 1000-0.9 UT/500ML-% IV SOLN
INTRAVENOUS | Status: AC
  Filled 2022-01-12: qty 500

## 2022-01-12 MED ORDER — CLOPIDOGREL BISULFATE 75 MG PO TABS
75.0000 mg | ORAL_TABLET | Freq: Every day | ORAL | Status: DC
  Administered 2022-01-13: 75 mg via ORAL
  Filled 2022-01-12: qty 1

## 2022-01-12 SURGICAL SUPPLY — 25 items
BALL SAPPHIRE NC24 3.5X12 (BALLOONS) ×2
BALLN SAPPHIRE 2.5X15 (BALLOONS) ×2
BALLOON SAPPHIRE 2.5X15 (BALLOONS) IMPLANT
BALLOON SAPPHIRE NC24 3.5X12 (BALLOONS) IMPLANT
CATH LAUNCHER 6FR AL1 (CATHETERS) IMPLANT
CATH TELESCOPE 6F GEC (CATHETERS) ×1 IMPLANT
CATH VISTA GUIDE 6FR XBLAD4 (CATHETERS) ×1 IMPLANT
CATHETER LAUNCHER 6FR AL1 (CATHETERS) ×2
DEVICE RAD COMP TR BAND LRG (VASCULAR PRODUCTS) ×1 IMPLANT
ELECT DEFIB PAD ADLT CADENCE (PAD) ×1 IMPLANT
GLIDESHEATH SLEND SS 6F .021 (SHEATH) ×1 IMPLANT
GUIDEWIRE INQWIRE 1.5J.035X260 (WIRE) IMPLANT
INQWIRE 1.5J .035X260CM (WIRE) ×2
KIT ENCORE 26 ADVANTAGE (KITS) ×1 IMPLANT
KIT HEART LEFT (KITS) ×2 IMPLANT
PACK CARDIAC CATHETERIZATION (CUSTOM PROCEDURE TRAY) ×2 IMPLANT
STENT SYNERGY XD 3.0X16 (Permanent Stent) IMPLANT
STENT SYNERGY XD 4.0X20 (Permanent Stent) IMPLANT
SYNERGY XD 3.0X16 (Permanent Stent) ×2 IMPLANT
SYNERGY XD 4.0X20 (Permanent Stent) ×2 IMPLANT
SYR CONTROL 10ML ANGIOGRAPHIC (SYRINGE) ×1 IMPLANT
TRANSDUCER W/STOPCOCK (MISCELLANEOUS) ×2 IMPLANT
TUBING CIL FLEX 10 FLL-RA (TUBING) ×2 IMPLANT
WIRE ASAHI PROWATER 180CM (WIRE) ×2 IMPLANT
WIRE HI TORQ WHISPER MS 190CM (WIRE) ×1 IMPLANT

## 2022-01-12 NOTE — Interval H&P Note (Signed)
History and Physical Interval Note: ? ?01/12/2022 ?1:32 PM ? ?Jonathan [name].  has presented today for surgery, with the diagnosis of cad.  The various methods of treatment have been discussed with the patient and family. After consideration of risks, benefits and other options for treatment, the patient has consented to  Procedure(s): ?CORONARY STENT INTERVENTION (N/A) as a surgical intervention.  The patient's history has been reviewed, patient examined, no change in status, stable for surgery.  I have reviewed the patient's chart and labs.  Questions were answered to the patient's satisfaction.   ?Cath Lab Visit (complete for each Cath Lab visit) ? ?Clinical Evaluation Leading to the Procedure:  ? ?ACS: No. ? ?Non-ACS:   ? ?Anginal Classification: CCS I ? ?Anti-ischemic medical therapy: Maximal Therapy (2 or more classes of medications) ? ?Non-Invasive Test Results: No non-invasive testing performed ? ?Prior CABG: No previous CABG ? ? ? ? ? ? ? ?Jonathan [name] [facility] ?01/12/2022 ?1:33 PM ? ? ? ?

## 2022-01-12 NOTE — Telephone Encounter (Signed)
I was able to speak with Jonathan [name] and go over recently lab work including [place] 2 testing which was positive for several variants associated with MDS. I had also reviewed with Dr. Marin Olp and no intervention needed at this time. Patient continues to do well and has no questions or concerns at this time. We will follow-up along monitoring his counts periodically. Follow-up in 6 months. Patient in agreement with the plan and appreciative of call.  ?

## 2022-01-12 NOTE — H&P (View-Only) (Signed)
? ? ?Subjective:  ?Denies SSCP, palpitations or Dyspnea ?He wants to proceed with 2 vessel PCI/Stent this am ? ?Objective:  ?Vitals:  ? 01/11/22 2009 01/11/22 2220 01/12/22 0047 01/12/22 0552  ?BP: 133/81  (!) 141/77 135/74  ?Pulse: 65  62 63  ?Resp: '16  20 11  '$ ?Temp: 97.8 ?F (36.6 ?C)  97.9 ?F (36.6 ?C) 97.6 ?F (36.4 ?C)  ?TempSrc: Oral  Oral Oral  ?SpO2:  96% 92% 95%  ?Weight:      ?Height:      ? ? ?Intake/Output from previous day: ? ?Intake/Output Summary (Last 24 hours) at 01/12/2022 0844 ?Last data filed at 01/12/2022 0048 ?Gross per 24 hour  ?Intake 243 ml  ?Output 350 ml  ?Net -107 ml  ? ? ?Physical Exam: ?Obese black male ?Lungs basilar atelectasis ?No murmur ?Abdomen benign  ?Right radial A ?Trace edema ? ?Lab Results: ?Basic Metabolic Panel: ?Recent Labs  ?  01/11/22 ?5329 01/11/22 ?0957 01/11/22 ?1624 01/11/22 ?1630  ?NA 138   < > 142 142  ?K 4.9   < > 3.4* 3.4*  ?CL 108  --   --   --   ?CO2 22  --   --   --   ?GLUCOSE 136*  --   --   --   ?BUN 18  --   --   --   ?CREATININE 1.09  --   --   --   ?CALCIUM 8.7*  --   --   --   ? < > = values in this interval not displayed.  ? ?Liver Function Tests: ?Recent Labs  ?  01/11/22 ?0952  ?AST 37  ?ALT 24  ?ALKPHOS 54  ?BILITOT 0.8  ?PROT 6.4*  ?ALBUMIN 3.4*  ? ?No results for input(s): LIPASE, AMYLASE in the last 72 hours. ?CBC: ?Recent Labs  ?  01/11/22 ?0952 01/11/22 ?0957 01/11/22 ?1624 01/11/22 ?1630  ?WBC 8.9  --   --   --   ?NEUTROABS 4.9  --   --   --   ?HGB 17.9*   < > 16.7 16.0  ?HCT 52.9*   < > 49.0 47.0  ?MCV 97.8  --   --   --   ?PLT 248  --   --   --   ? < > = values in this interval not displayed.  ? ?Cardiac Enzymes: ?No results for input(s): CKTOTAL, CKMB, CKMBINDEX, TROPONINI in the last 72 hours. ?BNP: ?Invalid input(s): POCBNP ?D-Dimer: ?Recent Labs  ?  01/11/22 ?0952  ?DDIMER 0.69*  ? ?Hemoglobin A1C: ?No results for input(s): HGBA1C in the last 72 hours. ?Fasting Lipid Panel: ?Recent Labs  ?  01/12/22 ?9242  ?CHOL 117  ?HDL 36*  ?[place] 65   ?TRIG 81  ?CHOLHDL 3.3  ? ?Thyroid Function Tests: ?No results for input(s): TSH, T4TOTAL, T3FREE, THYROIDAB in the last 72 hours. ? ?Invalid input(s): FREET3 ?Anemia Panel: ?No results for input(s): VITAMINB12, FOLATE, FERRITIN, TIBC, IRON, RETICCTPCT in the last 72 hours. ? ?Imaging: ?CARDIAC CATHETERIZATION ? ?Addendum Date: 01/11/2022   ?CONCLUSIONS: Severe two-vessel coronary disease with high-grade mid LAD on a significant bend and high-grade proximal RCA on a significant bend as well. Distal circumflex contains 60 to 70% stenosis. LVEDP is normal. Left main is normal. RECOMMENDATIONS: Need clinical correlation with the patient's presentation and the finding of coronary disease.  No enzyme changes or EKG changes are noted.  No chest discomfort.  The patient had dyspnea and felt that he might faint.  We discussed PCI.  He is probably not a good surgical candidate.  I think PCI of the LAD and right coronary is probably the appropriate therapy.  Radial arm approach was somewhat difficult but probably the preferred approach rather than femoral.  He wants to discuss with his family.  He is only caregiver for his wife who has dementia.  His sister is there with her now.  His son is traveling in from [place]. We will need to make plans about how to manage the patient's coronary disease whether or not it explains his presentation. ? ?Result Date: 01/11/2022 ?CONCLUSIONS: Severe two-vessel coronary disease with high-grade mid LAD on a significant bend and high-grade proximal RCA on a significant bend as well. Distal circumflex contains 60 to 70% stenosis. LVEDP is normal. Left main is normal. RECOMMENDATIONS: Need clinical correlation with the patient's presentation and the finding of coronary disease.  No enzyme changes or EKG changes are noted.  No chest discomfort.  The patient had dyspnea and felt that he might faint. We discussed PCI.  He is probably not a good surgical candidate.  I think PCI of the LAD  and right coronary is probably the appropriate therapy.  Radial arm approach was somewhat difficult but probably the preferred approach rather than femoral.  He wants to discuss with his family.  He is only caregiver for his wife who has dementia.  His sister is there with her now.  His son is traveling in from [place]. We will need to make plans about how to manage the patient's coronary disease whether or not it explains his presentation.  ? ?DG Chest Port 1 View ? ?Result Date: 01/11/2022 ?CLINICAL DATA:  Shortness of breath EXAM: PORTABLE CHEST 1 VIEW COMPARISON:  Chest radiograph dated December 28, 2021 FINDINGS: The heart is normal in size. Aorta is tortuous with atherosclerotic calcifications. Prominence of bilateral bronchovascular markings concerning for reactive bronchitis or reactive airway disease. No focal consolidation or large pleural effusion. IMPRESSION: 1. Prominence of the bronchovascular markings suggesting bronchitis or reactive airway disease, unchanged. 2. No focal consolidation or large pleural effusion. Electronically Signed   By: Keane Police D.O.   On: 01/11/2022 10:02  ? ?ECHOCARDIOGRAM COMPLETE ? ?Result Date: 01/11/2022 ?   ECHOCARDIOGRAM REPORT   Patient Name:   Jonathan [name]. Date of Exam: 01/11/2022 Medical Rec #:  [id number]           Height:       74.0 in Accession #:    [id number]          Weight:       229.3 lb Date of Birth:  03/02/1936            BSA:          2.303 m? Patient Age:    86 years            BP:           130/78 mmHg Patient Gender: M                   HR:           71 bpm. Exam Location:  Inpatient Procedure: 3D Echo, 2D Echo, Cardiac Doppler, Color Doppler and Strain Analysis Indications:    Chest Pain R07.9  History:        Patient has prior history of Echocardiogram examinations, most  recent 12/16/2013. CAD, COPD and PAD; Risk Factors:Former Smoker,                 Hypertension, Diabetes and Dyslipidemia.  Sonographer:    Darlina Sicilian RDCS  Referring Phys: [place]  1. Left ventricular ejection fraction, by estimation, is 55 to 60%. The left ventricle has normal function. The left ventricle has no regional wall motion abnormalities. There is mild left ventricular hypertrophy. Left ventricular diastolic parameters were normal. The average left ventricular global longitudinal strain is -21.3 %. The global longitudinal strain is normal.  2. Right ventricular systolic function is normal. The right ventricular size is normal. There is normal pulmonary artery systolic pressure.  3. Left atrial size was moderately dilated.  4. The mitral valve is normal in structure. No evidence of mitral valve regurgitation. No evidence of mitral stenosis.  5. Tricuspid valve regurgitation is moderate.  6. The aortic valve is tricuspid. There is mild calcification of the aortic valve. Aortic valve regurgitation is mild. Aortic valve sclerosis is present, with no evidence of aortic valve stenosis.  7. The inferior vena cava is normal in size with greater than 50% respiratory variability, suggesting right atrial pressure of 3 mmHg. FINDINGS  Left Ventricle: Left ventricular ejection fraction, by estimation, is 55 to 60%. The left ventricle has normal function. The left ventricle has no regional wall motion abnormalities. The average left ventricular global longitudinal strain is -21.3 %. The global longitudinal strain is normal. The left ventricular internal cavity size was normal in size. There is mild left ventricular hypertrophy. Left ventricular diastolic parameters were normal. Right Ventricle: The right ventricular size is normal. No increase in right ventricular wall thickness. Right ventricular systolic function is normal. There is normal pulmonary artery systolic pressure. The tricuspid regurgitant velocity is 2.70 m/s, and  with an assumed right atrial pressure of 3 mmHg, the estimated right ventricular systolic pressure is 95.6 mmHg. Left  Atrium: Left atrial size was moderately dilated. Right Atrium: Right atrial size was normal in size. Pericardium: There is no evidence of pericardial effusion. Mitral Valve: The mitral valve is normal in st

## 2022-01-12 NOTE — Progress Notes (Signed)
?PROGRESS NOTE ? ? ?Jonathan [name].  BTD:[id number] DOB: [birth year] DOA: 01/11/2022 ?PCP: Colon Branch, MD  ?Brief Narrative:  ?86 year old community dwelling male CAD plus stenting 2006, PVD left SFA stent 2009 ?HTN ?COPD on oxygen at home ?DM TY 2 HLD ?Was working at detach garden with son-in-law felt like he was going to pass out this lasted for 20 minutes he was hypoxic in the 80s ?Blood pressure on arrival was 140s he was saturating 95% on 3 L ?ABG showed pH 7.45 oxygen 41, potassium initially was 6.2 high-sensitivity troponin were negative ? ?Given his presentation it was felt that he had unstable angina ?Cardiology was consulted ?4/27-cardiac cath showed severe mid LAD issue ?4/28-cardiac cath with PCI to LAD which was 90% stenosed Synergy XD 4.0X20 stent, DES to RCA Synergy XD 3.0 X16 ? ? ?Hospital-Problem based course ? ?Unstable angina status post stent 4/28 ?As per cardiology ?Current meds: Norvasc 5 metoprolol XL 100 Zocor 20 aspirin 81 Plavix 75 Jardiance 10 ?Probably would hold Maxide on discharge ?PVD ?Continue antiplatelet ?DM TY 2 ?Sugars well controlled 100-1 43 continue SSI, continue gabapentin 100 daily ?Wait 48 hours until 4/30 prior to resumption of metformin ?May resume Trulicity on discharge ?Mild COPD ?As needed albuterol ? ?DVT prophylaxis: Lovenox ?Code Status: Full ?Family Communication: Discussed with wife and family at the bedside ?Disposition:  ?Status is: Observation ?The patient remains OBS appropriate and will d/c before 2 midnights. ?  ?Consultants:  ?Cardiology ? ?Procedures: Cardiac cath and PCI as above ? ?Antimicrobials: None ? ? ?Subjective: ?No current chest pain looks comfortable seen before and after cardiac cath ? ?Objective: ?Vitals:  ? 01/11/22 2220 01/12/22 0047 01/12/22 0552 01/12/22 0845  ?BP:  (!) 141/77 135/74 (!) 146/75  ?Pulse:  62 63 67  ?Resp:  20 11   ?Temp:  97.9 ?F (36.6 ?C) 97.6 ?F (36.4 ?C)   ?TempSrc:  Oral Oral   ?SpO2: 96% 92% 95%   ?Weight:       ?Height:      ? ? ?Intake/Output Summary (Last 24 hours) at 01/12/2022 1145 ?Last data filed at 01/12/2022 0900 ?Gross per 24 hour  ?Intake 243 ml  ?Output 650 ml  ?Net -407 ml  ? ?Filed Weights  ? 01/11/22 0905  ?Weight: 104 kg  ? ? ?Examination: ? ?EOMI NCAT looks younger than stated age ?CTA B no rales rhonchi wheeze ?ROM intact no focal deficit moving all 4 limbs ?Telemetry  benign but was taken off of this area ?No lower extremity edema ?Neurologically intact power 5/5 ? ? ?Data Reviewed: personally reviewed  ? ?CBC ?   ?Component Value Date/Time  ? WBC 7.9 01/12/2022 0344  ? RBC 4.86 01/12/2022 0344  ? HGB 15.8 01/12/2022 0344  ? HGB 17.0 01/02/2022 1412  ? HCT 47.4 01/12/2022 0344  ? PLT 217 01/12/2022 0344  ? PLT 240 01/02/2022 1412  ? MCV 97.5 01/12/2022 0344  ? MCH 32.5 01/12/2022 0344  ? MCHC 33.3 01/12/2022 0344  ? RDW 14.5 01/12/2022 0344  ? LYMPHSABS 2.8 01/11/2022 0952  ? MONOABS 0.8 01/11/2022 0952  ? EOSABS 0.3 01/11/2022 0952  ? BASOSABS 0.1 01/11/2022 1062  ? ? ?  Latest Ref Rng & Units 01/12/2022  ?  3:44 AM 01/11/2022  ?  4:30 PM 01/11/2022  ?  4:24 PM  ?CMP  ?Glucose 70 - 99 mg/dL 131      ?BUN 8 - 23 mg/dL 11      ?Creatinine 0.61 -  1.24 mg/dL 1.13      ?Sodium 135 - 145 mmol/L 136   142   142    ?Potassium 3.5 - 5.1 mmol/L 3.7   3.4   3.4    ?Chloride 98 - 111 mmol/L 108      ?CO2 22 - 32 mmol/L 19      ?Calcium 8.9 - 10.3 mg/dL 8.2      ?Total Protein 6.5 - 8.1 g/dL 5.5      ?Total Bilirubin 0.3 - 1.2 mg/dL 0.5      ?Alkaline Phos 38 - 126 U/L 40      ?AST 15 - 41 U/L 20      ?ALT 0 - 44 U/L 22      ? ? ? ?Radiology Studies: ?CARDIAC CATHETERIZATION ? ?Addendum Date: 01/11/2022   ?CONCLUSIONS: Severe two-vessel coronary disease with high-grade mid LAD on a significant bend and high-grade proximal RCA on a significant bend as well. Distal circumflex contains 60 to 70% stenosis. LVEDP is normal. Left main is normal. RECOMMENDATIONS: Need clinical correlation with the patient's presentation and the  finding of coronary disease.  No enzyme changes or EKG changes are noted.  No chest discomfort.  The patient had dyspnea and felt that he might faint. We discussed PCI.  He is probably not a good surgical candidate.  I think PCI of the LAD and right coronary is probably the appropriate therapy.  Radial arm approach was somewhat difficult but probably the preferred approach rather than femoral.  He wants to discuss with his family.  He is only caregiver for his wife who has dementia.  His sister is there with her now.  His son is traveling in from [place]. We will need to make plans about how to manage the patient's coronary disease whether or not it explains his presentation. ? ?Result Date: 01/11/2022 ?CONCLUSIONS: Severe two-vessel coronary disease with high-grade mid LAD on a significant bend and high-grade proximal RCA on a significant bend as well. Distal circumflex contains 60 to 70% stenosis. LVEDP is normal. Left main is normal. RECOMMENDATIONS: Need clinical correlation with the patient's presentation and the finding of coronary disease.  No enzyme changes or EKG changes are noted.  No chest discomfort.  The patient had dyspnea and felt that he might faint. We discussed PCI.  He is probably not a good surgical candidate.  I think PCI of the LAD and right coronary is probably the appropriate therapy.  Radial arm approach was somewhat difficult but probably the preferred approach rather than femoral.  He wants to discuss with his family.  He is only caregiver for his wife who has dementia.  His sister is there with her now.  His son is traveling in from [place]. We will need to make plans about how to manage the patient's coronary disease whether or not it explains his presentation.  ? ?DG Chest Port 1 View ? ?Result Date: 01/11/2022 ?CLINICAL DATA:  Shortness of breath EXAM: PORTABLE CHEST 1 VIEW COMPARISON:  Chest radiograph dated December 28, 2021 FINDINGS: The heart is normal in size. Aorta  is tortuous with atherosclerotic calcifications. Prominence of bilateral bronchovascular markings concerning for reactive bronchitis or reactive airway disease. No focal consolidation or large pleural effusion. IMPRESSION: 1. Prominence of the bronchovascular markings suggesting bronchitis or reactive airway disease, unchanged. 2. No focal consolidation or large pleural effusion. Electronically Signed   By: Keane Police D.O.   On: 01/11/2022 10:02  ? ?ECHOCARDIOGRAM COMPLETE ? ?  Result Date: 01/11/2022 ?   ECHOCARDIOGRAM REPORT   Patient Name:   Jonathan [name]. Date of Exam: 01/11/2022 Medical Rec #:  [id number]           Height:       74.0 in Accession #:    [id number]          Weight:       229.3 lb Date of Birth:  [birth year]            BSA:          2.303 m? Patient Age:    29 years            BP:           130/78 mmHg Patient Gender: M                   HR:           71 bpm. Exam Location:  Inpatient Procedure: 3D Echo, 2D Echo, Cardiac Doppler, Color Doppler and Strain Analysis Indications:    Chest Pain R07.9  History:        Patient has prior history of Echocardiogram examinations, most                 recent 12/16/2013. CAD, COPD and PAD; Risk Factors:Former Smoker,                 Hypertension, Diabetes and Dyslipidemia.  Sonographer:    Darlina Sicilian RDCS Referring Phys: [place]  1. Left ventricular ejection fraction, by estimation, is 55 to 60%. The left ventricle has normal function. The left ventricle has no regional wall motion abnormalities. There is mild left ventricular hypertrophy. Left ventricular diastolic parameters were normal. The average left ventricular global longitudinal strain is -21.3 %. The global longitudinal strain is normal.  2. Right ventricular systolic function is normal. The right ventricular size is normal. There is normal pulmonary artery systolic pressure.  3. Left atrial size was moderately dilated.  4. The mitral valve is normal in structure. No evidence  of mitral valve regurgitation. No evidence of mitral stenosis.  5. Tricuspid valve regurgitation is moderate.  6. The aortic valve is tricuspid. There is mild calcification of the aortic valve. Aortic valve

## 2022-01-12 NOTE — Progress Notes (Signed)
? ? ?Subjective:  ?Denies SSCP, palpitations or Dyspnea ?He wants to proceed with 2 vessel PCI/Stent this am ? ?Objective:  ?Vitals:  ? 01/11/22 2009 01/11/22 2220 01/12/22 0047 01/12/22 0552  ?BP: 133/81  (!) 141/77 135/74  ?Pulse: 65  62 63  ?Resp: '16  20 11  '$ ?Temp: 97.8 ?F (36.6 ?C)  97.9 ?F (36.6 ?C) 97.6 ?F (36.4 ?C)  ?TempSrc: Oral  Oral Oral  ?SpO2:  96% 92% 95%  ?Weight:      ?Height:      ? ? ?Intake/Output from previous day: ? ?Intake/Output Summary (Last 24 hours) at 01/12/2022 0844 ?Last data filed at 01/12/2022 0048 ?Gross per 24 hour  ?Intake 243 ml  ?Output 350 ml  ?Net -107 ml  ? ? ?Physical Exam: ?Obese black male ?Lungs basilar atelectasis ?No murmur ?Abdomen benign  ?Right radial A ?Trace edema ? ?Lab Results: ?Basic Metabolic Panel: ?Recent Labs  ?  01/11/22 ?5631 01/11/22 ?0957 01/11/22 ?1624 01/11/22 ?1630  ?NA 138   < > 142 142  ?K 4.9   < > 3.4* 3.4*  ?CL 108  --   --   --   ?CO2 22  --   --   --   ?GLUCOSE 136*  --   --   --   ?BUN 18  --   --   --   ?CREATININE 1.09  --   --   --   ?CALCIUM 8.7*  --   --   --   ? < > = values in this interval not displayed.  ? ?Liver Function Tests: ?Recent Labs  ?  01/11/22 ?0952  ?AST 37  ?ALT 24  ?ALKPHOS 54  ?BILITOT 0.8  ?PROT 6.4*  ?ALBUMIN 3.4*  ? ?No results for input(s): LIPASE, AMYLASE in the last 72 hours. ?CBC: ?Recent Labs  ?  01/11/22 ?0952 01/11/22 ?0957 01/11/22 ?1624 01/11/22 ?1630  ?WBC 8.9  --   --   --   ?NEUTROABS 4.9  --   --   --   ?HGB 17.9*   < > 16.7 16.0  ?HCT 52.9*   < > 49.0 47.0  ?MCV 97.8  --   --   --   ?PLT 248  --   --   --   ? < > = values in this interval not displayed.  ? ?Cardiac Enzymes: ?No results for input(s): CKTOTAL, CKMB, CKMBINDEX, TROPONINI in the last 72 hours. ?BNP: ?Invalid input(s): POCBNP ?D-Dimer: ?Recent Labs  ?  01/11/22 ?0952  ?DDIMER 0.69*  ? ?Hemoglobin A1C: ?No results for input(s): HGBA1C in the last 72 hours. ?Fasting Lipid Panel: ?Recent Labs  ?  01/12/22 ?4970  ?CHOL 117  ?HDL 36*  ?[place] 65   ?TRIG 81  ?CHOLHDL 3.3  ? ?Thyroid Function Tests: ?No results for input(s): TSH, T4TOTAL, T3FREE, THYROIDAB in the last 72 hours. ? ?Invalid input(s): FREET3 ?Anemia Panel: ?No results for input(s): VITAMINB12, FOLATE, FERRITIN, TIBC, IRON, RETICCTPCT in the last 72 hours. ? ?Imaging: ?CARDIAC CATHETERIZATION ? ?Addendum Date: 01/11/2022   ?CONCLUSIONS: Severe two-vessel coronary disease with high-grade mid LAD on a significant bend and high-grade proximal RCA on a significant bend as well. Distal circumflex contains 60 to 70% stenosis. LVEDP is normal. Left main is normal. RECOMMENDATIONS: Need clinical correlation with the patient's presentation and the finding of coronary disease.  No enzyme changes or EKG changes are noted.  No chest discomfort.  The patient had dyspnea and felt that he might faint.  We discussed PCI.  He is probably not a good surgical candidate.  I think PCI of the LAD and right coronary is probably the appropriate therapy.  Radial arm approach was somewhat difficult but probably the preferred approach rather than femoral.  He wants to discuss with his family.  He is only caregiver for his wife who has dementia.  His sister is there with her now.  His son is traveling in from [place]. We will need to make plans about how to manage the patient's coronary disease whether or not it explains his presentation. ? ?Result Date: 01/11/2022 ?CONCLUSIONS: Severe two-vessel coronary disease with high-grade mid LAD on a significant bend and high-grade proximal RCA on a significant bend as well. Distal circumflex contains 60 to 70% stenosis. LVEDP is normal. Left main is normal. RECOMMENDATIONS: Need clinical correlation with the patient's presentation and the finding of coronary disease.  No enzyme changes or EKG changes are noted.  No chest discomfort.  The patient had dyspnea and felt that he might faint. We discussed PCI.  He is probably not a good surgical candidate.  I think PCI of the LAD  and right coronary is probably the appropriate therapy.  Radial arm approach was somewhat difficult but probably the preferred approach rather than femoral.  He wants to discuss with his family.  He is only caregiver for his wife who has dementia.  His sister is there with her now.  His son is traveling in from [place]. We will need to make plans about how to manage the patient's coronary disease whether or not it explains his presentation.  ? ?DG Chest Port 1 View ? ?Result Date: 01/11/2022 ?CLINICAL DATA:  Shortness of breath EXAM: PORTABLE CHEST 1 VIEW COMPARISON:  Chest radiograph dated December 28, 2021 FINDINGS: The heart is normal in size. Aorta is tortuous with atherosclerotic calcifications. Prominence of bilateral bronchovascular markings concerning for reactive bronchitis or reactive airway disease. No focal consolidation or large pleural effusion. IMPRESSION: 1. Prominence of the bronchovascular markings suggesting bronchitis or reactive airway disease, unchanged. 2. No focal consolidation or large pleural effusion. Electronically Signed   By: Keane Police D.O.   On: 01/11/2022 10:02  ? ?ECHOCARDIOGRAM COMPLETE ? ?Result Date: 01/11/2022 ?   ECHOCARDIOGRAM REPORT   Patient Name:   Jonathan [name]. Date of Exam: 01/11/2022 Medical Rec #:  [id number]           Height:       74.0 in Accession #:    [id number]          Weight:       229.3 lb Date of Birth:  [birth year]            BSA:          2.303 m? Patient Age:    86 years            BP:           130/78 mmHg Patient Gender: M                   HR:           71 bpm. Exam Location:  Inpatient Procedure: 3D Echo, 2D Echo, Cardiac Doppler, Color Doppler and Strain Analysis Indications:    Chest Pain R07.9  History:        Patient has prior history of Echocardiogram examinations, most  recent 12/16/2013. CAD, COPD and PAD; Risk Factors:Former Smoker,                 Hypertension, Diabetes and Dyslipidemia.  Sonographer:    Darlina Sicilian RDCS  Referring Phys: [place]  1. Left ventricular ejection fraction, by estimation, is 55 to 60%. The left ventricle has normal function. The left ventricle has no regional wall motion abnormalities. There is mild left ventricular hypertrophy. Left ventricular diastolic parameters were normal. The average left ventricular global longitudinal strain is -21.3 %. The global longitudinal strain is normal.  2. Right ventricular systolic function is normal. The right ventricular size is normal. There is normal pulmonary artery systolic pressure.  3. Left atrial size was moderately dilated.  4. The mitral valve is normal in structure. No evidence of mitral valve regurgitation. No evidence of mitral stenosis.  5. Tricuspid valve regurgitation is moderate.  6. The aortic valve is tricuspid. There is mild calcification of the aortic valve. Aortic valve regurgitation is mild. Aortic valve sclerosis is present, with no evidence of aortic valve stenosis.  7. The inferior vena cava is normal in size with greater than 50% respiratory variability, suggesting right atrial pressure of 3 mmHg. FINDINGS  Left Ventricle: Left ventricular ejection fraction, by estimation, is 55 to 60%. The left ventricle has normal function. The left ventricle has no regional wall motion abnormalities. The average left ventricular global longitudinal strain is -21.3 %. The global longitudinal strain is normal. The left ventricular internal cavity size was normal in size. There is mild left ventricular hypertrophy. Left ventricular diastolic parameters were normal. Right Ventricle: The right ventricular size is normal. No increase in right ventricular wall thickness. Right ventricular systolic function is normal. There is normal pulmonary artery systolic pressure. The tricuspid regurgitant velocity is 2.70 m/s, and  with an assumed right atrial pressure of 3 mmHg, the estimated right ventricular systolic pressure is 80.9 mmHg. Left  Atrium: Left atrial size was moderately dilated. Right Atrium: Right atrial size was normal in size. Pericardium: There is no evidence of pericardial effusion. Mitral Valve: The mitral valve is normal in st

## 2022-01-13 DIAGNOSIS — I200 Unstable angina: Secondary | ICD-10-CM | POA: Diagnosis not present

## 2022-01-13 LAB — CBC
HCT: 49.4 % (ref 39.0–52.0)
Hemoglobin: 17 g/dL (ref 13.0–17.0)
MCH: 33.2 pg (ref 26.0–34.0)
MCHC: 34.4 g/dL (ref 30.0–36.0)
MCV: 96.5 fL (ref 80.0–100.0)
Platelets: 219 K/uL (ref 150–400)
RBC: 5.12 MIL/uL (ref 4.22–5.81)
RDW: 14.5 % (ref 11.5–15.5)
WBC: 10 K/uL (ref 4.0–10.5)
nRBC: 0 % (ref 0.0–0.2)

## 2022-01-13 LAB — BASIC METABOLIC PANEL
Anion gap: 10 (ref 5–15)
BUN: 14 mg/dL (ref 8–23)
CO2: 18 mmol/L — ABNORMAL LOW (ref 22–32)
Calcium: 8.7 mg/dL — ABNORMAL LOW (ref 8.9–10.3)
Chloride: 110 mmol/L (ref 98–111)
Creatinine, Ser: 1.21 mg/dL (ref 0.61–1.24)
GFR, Estimated: 59 mL/min — ABNORMAL LOW (ref >60)
Glucose, Bld: 127 mg/dL — ABNORMAL HIGH (ref 70–99)
Potassium: 4.3 mmol/L (ref 3.5–5.1)
Sodium: 138 mmol/L (ref 135–145)

## 2022-01-13 LAB — GLUCOSE, CAPILLARY: Glucose-Capillary: 155 mg/dL — ABNORMAL HIGH (ref 70–99)

## 2022-01-13 MED ORDER — CLOPIDOGREL BISULFATE 75 MG PO TABS: 75.0000 mg | ORAL_TABLET | Freq: Every day | ORAL | 0 refills | Status: DC

## 2022-01-13 MED ORDER — METFORMIN HCL 1000 MG PO TABS
500.0000 mg | ORAL_TABLET | Freq: Two times a day (BID) | ORAL | Status: DC
Start: 2022-01-15 — End: 2022-04-16

## 2022-01-13 NOTE — Progress Notes (Signed)
? ? ?Subjective:  ?Doing well post cath ?A little confused this am just awoke ?Slight left hand tremor  ? ?Objective:  ?Vitals:  ? 01/13/22 0008 01/13/22 0100 01/13/22 0529 01/13/22 0541  ?BP:  (!) 156/76 (!) 169/84   ?Pulse:  80 80   ?Resp:  15    ?Temp:  97.8 ?F (36.6 ?C) (!) 97.3 ?F (36.3 ?C)   ?TempSrc:  Oral Oral   ?SpO2: 95% 95% 95% 96%  ?Weight:      ?Height:      ? ? ?Intake/Output from previous day: ? ?Intake/Output Summary (Last 24 hours) at 01/13/2022 0824 ?Last data filed at 01/13/2022 0041 ?Gross per 24 hour  ?Intake 1529.96 ml  ?Output 2850 ml  ?Net -1320.04 ml  ? ? ?Physical Exam: ?Obese black male ?Lungs basilar atelectasis ?No murmur ?Abdomen benign  ?Right radial A Right femoral A  ?Trace edema ? ?Lab Results: ?Basic Metabolic Panel: ?Recent Labs  ?  01/12/22 ?4481 01/13/22 ?0248  ?NA 136 138  ?K 3.7 4.3  ?CL 108 110  ?CO2 19* 18*  ?GLUCOSE 131* 127*  ?BUN 11 14  ?CREATININE 1.13 1.21  ?CALCIUM 8.2* 8.7*  ? ?Liver Function Tests: ?Recent Labs  ?  01/11/22 ?0952 01/12/22 ?8563  ?AST 37 20  ?ALT 24 22  ?ALKPHOS 54 40  ?BILITOT 0.8 0.5  ?PROT 6.4* 5.5*  ?ALBUMIN 3.4* 2.9*  ? ?No results for input(s): LIPASE, AMYLASE in the last 72 hours. ?CBC: ?Recent Labs  ?  01/11/22 ?0952 01/11/22 ?0957 01/12/22 ?1497 01/13/22 ?0248  ?WBC 8.9  --  7.9 10.0  ?NEUTROABS 4.9  --   --   --   ?HGB 17.9*   < > 15.8 17.0  ?HCT 52.9*   < > 47.4 49.4  ?MCV 97.8  --  97.5 96.5  ?PLT 248  --  217 219  ? < > = values in this interval not displayed.  ? ?Cardiac Enzymes: ?No results for input(s): CKTOTAL, CKMB, CKMBINDEX, TROPONINI in the last 72 hours. ?BNP: ?Invalid input(s): POCBNP ?D-Dimer: ?Recent Labs  ?  01/11/22 ?0952  ?DDIMER 0.69*  ? ?Hemoglobin A1C: ?No results for input(s): HGBA1C in the last 72 hours. ?Fasting Lipid Panel: ?Recent Labs  ?  01/12/22 ?0263  ?CHOL 117  ?HDL 36*  ?Cloud 65  ?TRIG 81  ?CHOLHDL 3.3  ? ?Thyroid Function Tests: ?No results for input(s): TSH, T4TOTAL, T3FREE, THYROIDAB in the last 72  hours. ? ?Invalid input(s): FREET3 ?Anemia Panel: ?No results for input(s): VITAMINB12, FOLATE, FERRITIN, TIBC, IRON, RETICCTPCT in the last 72 hours. ? ?Imaging: ?CARDIAC CATHETERIZATION ? ?Result Date: 01/12/2022 ?  Prox LAD lesion is 90% stenosed.   A drug-eluting stent was successfully placed using a SYNERGY XD 4.0X20.   Post intervention, there is a 0% residual stenosis.   Prox RCA lesion is 75% stenosed.   A drug-eluting stent was successfully placed using a SYNERGY XD 3.0X16.   Post intervention, there is a 0% residual stenosis. Successful PCI of the mid LAD with DES x 1 Successful PCI of the proximal RCA with DES x 1 Plan: DAPT with ASA and Plavix for one year. Anticipate DC in am if no complications.  ? ?CARDIAC CATHETERIZATION ? ?Addendum Date: 01/11/2022   ?CONCLUSIONS: Severe two-vessel coronary disease with high-grade mid LAD on a significant bend and high-grade proximal RCA on a significant bend as well. Distal circumflex contains 60 to 70% stenosis. LVEDP is normal. Left main is normal. RECOMMENDATIONS: Need clinical correlation with  the patient's presentation and the finding of coronary disease.  No enzyme changes or EKG changes are noted.  No chest discomfort.  The patient had dyspnea and felt that he might faint. We discussed PCI.  He is probably not a good surgical candidate.  I think PCI of the LAD and right coronary is probably the appropriate therapy.  Radial arm approach was somewhat difficult but probably the preferred approach rather than femoral.  He wants to discuss with his family.  He is only caregiver for his wife who has dementia.  His sister is there with her now.  His son is traveling in from [place]. We will need to make plans about how to manage the patient's coronary disease whether or not it explains his presentation. ? ?Result Date: 01/11/2022 ?CONCLUSIONS: Severe two-vessel coronary disease with high-grade mid LAD on a significant bend and high-grade proximal RCA on a  significant bend as well. Distal circumflex contains 60 to 70% stenosis. LVEDP is normal. Left main is normal. RECOMMENDATIONS: Need clinical correlation with the patient's presentation and the finding of coronary disease.  No enzyme changes or EKG changes are noted.  No chest discomfort.  The patient had dyspnea and felt that he might faint. We discussed PCI.  He is probably not a good surgical candidate.  I think PCI of the LAD and right coronary is probably the appropriate therapy.  Radial arm approach was somewhat difficult but probably the preferred approach rather than femoral.  He wants to discuss with his family.  He is only caregiver for his wife who has dementia.  His sister is there with her now.  His son is traveling in from [place]. We will need to make plans about how to manage the patient's coronary disease whether or not it explains his presentation.  ? ?DG Chest Port 1 View ? ?Result Date: 01/11/2022 ?CLINICAL DATA:  Shortness of breath EXAM: PORTABLE CHEST 1 VIEW COMPARISON:  Chest radiograph dated December 28, 2021 FINDINGS: The heart is normal in size. Aorta is tortuous with atherosclerotic calcifications. Prominence of bilateral bronchovascular markings concerning for reactive bronchitis or reactive airway disease. No focal consolidation or large pleural effusion. IMPRESSION: 1. Prominence of the bronchovascular markings suggesting bronchitis or reactive airway disease, unchanged. 2. No focal consolidation or large pleural effusion. Electronically Signed   By: Keane Police D.O.   On: 01/11/2022 10:02  ? ?ECHOCARDIOGRAM COMPLETE ? ?Result Date: 01/11/2022 ?   ECHOCARDIOGRAM REPORT   Patient Name:   Jonathan [name]. Date of Exam: 01/11/2022 Medical Rec #:  [id number]           Height:       74.0 in Accession #:    [id number]          Weight:       229.3 lb Date of Birth:  [birth year]            BSA:          2.303 m? Patient Age:    86 years            BP:           130/78 mmHg Patient Gender:  M                   HR:           71 bpm. Exam Location:  Inpatient Procedure: 3D Echo, 2D Echo, Cardiac Doppler, Color Doppler and Strain Analysis Indications:    Chest  Pain R07.9  History:        Patient has prior history of Echocardiogram examinations, most                 recent 12/16/2013. CAD, COPD and PAD; Risk Factors:Former Smoker,                 Hypertension, Diabetes and Dyslipidemia.  Sonographer:    Darlina Sicilian RDCS Referring Phys: [place]  1. Left ventricular ejection fraction, by estimation, is 55 to 60%. The left ventricle has normal function. The left ventricle has no regional wall motion abnormalities. There is mild left ventricular hypertrophy. Left ventricular diastolic parameters were normal. The average left ventricular global longitudinal strain is -21.3 %. The global longitudinal strain is normal.  2. Right ventricular systolic function is normal. The right ventricular size is normal. There is normal pulmonary artery systolic pressure.  3. Left atrial size was moderately dilated.  4. The mitral valve is normal in structure. No evidence of mitral valve regurgitation. No evidence of mitral stenosis.  5. Tricuspid valve regurgitation is moderate.  6. The aortic valve is tricuspid. There is mild calcification of the aortic valve. Aortic valve regurgitation is mild. Aortic valve sclerosis is present, with no evidence of aortic valve stenosis.  7. The inferior vena cava is normal in size with greater than 50% respiratory variability, suggesting right atrial pressure of 3 mmHg. FINDINGS  Left Ventricle: Left ventricular ejection fraction, by estimation, is 55 to 60%. The left ventricle has normal function. The left ventricle has no regional wall motion abnormalities. The average left ventricular global longitudinal strain is -21.3 %. The global longitudinal strain is normal. The left ventricular internal cavity size was normal in size. There is mild left ventricular  hypertrophy. Left ventricular diastolic parameters were normal. Right Ventricle: The right ventricular size is normal. No increase in right ventricular wall thickness. Right ventricular systolic function is normal.

## 2022-01-13 NOTE — Progress Notes (Signed)
SATURATION QUALIFICATIONS: (This note is used to comply with regulatory documentation for home oxygen)  Patient Saturations on Room Air at Rest = 88%  Patient Saturations on Room Air while Ambulating = 87%  Patient Saturations on 2 Liters of oxygen while Ambulating = 92%  Please briefly explain why patient needs home oxygen: 

## 2022-01-13 NOTE — TOC CM/SW Note (Signed)
Patient states he has portable oxygen and concentrator at home. States his son can bring portable oxygen upon hospital dc. Declines any further TOC assistance at this time. ? ? ?Marthenia Rolling, MSN, RN,BSN ?Inpatient [facility] Case Manager ?[phone]   ?

## 2022-01-13 NOTE — Discharge Summary (Signed)
Physician Discharge Summary  ?Pajonal TFT:[id number] DOB: [birth year] DOA: 01/11/2022 ? ?PCP: Colon Branch, MD ? ?Admit date: 01/11/2022 ?Discharge date: 01/13/2022 ? ?Time spent: 25 minutes ? ?Recommendations for Outpatient Follow-up:  ?Needs CMET cbc 1 week ?Needs TOC visit with Cardiology 1 week ? ?Discharge Diagnoses:  ?MAIN problem for hospitalization  ? ?[place] s/p stent placement this admit as per Below ? ?Please see below for itemized issues addressed in HOpsital- ?refer to other progress notes for clarity if needed ? ?Discharge Condition: fair ? ?Diet recommendation: hh ? ?Filed Weights  ? 01/11/22 0905  ?Weight: 104 kg  ? ? ?History of present illness:  ?86 year old community dwelling male CAD plus stenting 2006, PVD left SFA stent 2009 ?HTN ?COPD on oxygen at home ?DM TY 2 HLD ?Was working at detach garden with son-in-law felt like he was going to pass out this lasted for 20 minutes he was hypoxic in the 80s ?Blood pressure on arrival was 140s he was saturating 95% on 3 L ?ABG showed pH 7.45 oxygen 41, potassium initially was 6.2 high-sensitivity troponin were negative ?  ?Given his presentation it was felt that he had unstable angina ?Cardiology was consulted ?4/27-cardiac cath showed severe mid LAD issue ?4/28-cardiac cath with PCI to LAD which was 90% stenosed Synergy XD 4.0X20 stent, DES to RCA Synergy XD 3.0 X16 ? ?Hospital Course:  ? ?Unstable angina status post stent 4/28 ?As per cardiology ?Current meds: Norvasc 5 metoprolol XL 100 Zocor 20 aspirin 81 Plavix 75 Jardiance 10 ?hold Maxide/losartan on discharge and may be able to resume these at low doses on folllow with Cardiology ?PVD ?Continue antiplatelet ?DM TY 2 ?Sugars well controlled -- continue gabapentin 100 daily ?Wait 48 hours until 4/30 prior to resumption of metformin ?May resume Trulicity on discharge ?Mild COPD ?As needed albuterol ? ? ?Discharge Exam: ?Vitals:  ? 01/13/22 0541 01/13/22 0908  ?BP:  (!) 141/84  ?Pulse:  77  ?Resp:   (!) 21  ?Temp:  98.1 ?F (36.7 ?C)  ?SpO2: 96% (!) 87%  ? ? ?Subj on day of d/c ?  ?Well coherent needed some oxygen on walking however ? ?Eomi ncat ?Cta b no added sound ?S1 s 2no m/r/g ?Abd soft nt nd no rebound no guarding ?Neuro intact ? ?Discharge Instructions ? ? ?Discharge Instructions   ? ? Amb Referral to Cardiac Rehabilitation   Complete by: As directed ?  ? Diagnosis:  Coronary Stents ?PTCA  ?  ? After initial evaluation and assessments completed: Virtual Based Care may be provided alone or in conjunction with Phase 2 Cardiac Rehab based on patient barriers.: Yes  ? Diet - low sodium heart healthy   Complete by: As directed ?  ? Discharge instructions   Complete by: As directed ?  ? Look at meds carefully--they have changed ?Use oxygen at all times  ? Increase activity slowly   Complete by: As directed ?  ? ?  ? ?Allergies as of 01/13/2022   ?No Known Allergies ?  ? ?  ?Medication List  ?  ? ?STOP taking these medications   ? ?freestyle lancets ?  ?losartan 100 MG tablet ?Commonly known as: COZAAR ?  ?triamterene-hydrochlorothiazide 37.5-25 MG tablet ?Commonly known as: MAXZIDE-25 ?  ? ?  ? ?TAKE these medications   ? ?albuterol 108 (90 Base) MCG/ACT inhaler ?Commonly known as: VENTOLIN HFA ?Inhale 1-2 puffs into the lungs every 6 (six) hours as needed for wheezing or shortness of breath. ?  ?  amLODipine 5 MG tablet ?Commonly known as: NORVASC ?Take 1 tablet (5 mg total) by mouth daily. ?  ?aspirin 81 MG tablet ?Take 81 mg by mouth daily. ?  ?clopidogrel 75 MG tablet ?Commonly known as: PLAVIX ?Take 1 tablet (75 mg total) by mouth daily with breakfast. ?Start taking on: January 14, 2022 ?  ?empagliflozin 10 MG Tabs tablet ?Commonly known as: JARDIANCE ?Take 10 mg by mouth daily. ?  ?famotidine 20 MG tablet ?Commonly known as: PEPCID ?Take 20 mg by mouth 2 (two) times daily as needed for heartburn or indigestion. ?  ?FreeStyle Freedom Lite w/Device Kit ?Use Freestyle Freedom Lite meter to check blood sugar  twice daily. DX:E11.65 ?  ?FREESTYLE LITE test strip ?Generic drug: glucose blood ?Use as instructed ?  ?gabapentin 100 MG capsule ?Commonly known as: NEURONTIN ?Take 100 mg by mouth daily. ?  ?metFORMIN 1000 MG tablet ?Commonly known as: GLUCOPHAGE ?Take 0.5 tablets (500 mg total) by mouth 2 (two) times daily with a meal. ?Start taking on: Jan 15, 2022 ?What changed: These instructions start on Jan 15, 2022. If you are unsure what to do until then, ask your doctor or other care provider. ?  ?metoprolol succinate 100 MG 24 hr tablet ?Commonly known as: Toprol XL ?Take 1 tablet (100 mg total) by mouth daily. ?  ?multivitamin with minerals Tabs tablet ?Take 1 tablet by mouth daily. ?  ?simvastatin 20 MG tablet ?Commonly known as: ZOCOR ?Take 20 mg by mouth daily. ?  ?Trulicity 3 PO/2.4MP Sopn ?Generic drug: Dulaglutide ?INJECT 3 MG UNDER THE SKIN ONCE A WEEK ?What changed: See the new instructions. ?  ? ?  ? ?No Known Allergies ? ? ? ?The results of significant diagnostics from this hospitalization (including imaging, microbiology, ancillary and laboratory) are listed below for reference.   ? ?Significant Diagnostic Studies: ?DG Chest 2 View ? ?Result Date: 12/28/2021 ?CLINICAL DATA:  Chest pain EXAM: CHEST - 2 VIEW COMPARISON:  08/03/2021 FINDINGS: Mild bronchitic changes. Streaky atelectasis at the bases. No consolidation, pleural effusion or pneumothorax. Stable cardiomediastinal silhouette with aortic atherosclerosis. IMPRESSION: No active cardiopulmonary disease. Chronic bronchitic changes and mild basilar atelectasis Electronically Signed   By: Donavan Foil M.D.   On: 12/28/2021 21:27  ? ?CARDIAC CATHETERIZATION ? ?Result Date: 01/12/2022 ?  Prox LAD lesion is 90% stenosed.   A drug-eluting stent was successfully placed using a SYNERGY XD 4.0X20.   Post intervention, there is a 0% residual stenosis.   Prox RCA lesion is 75% stenosed.   A drug-eluting stent was successfully placed using a SYNERGY XD 3.0X16.   Post  intervention, there is a 0% residual stenosis. Successful PCI of the mid LAD with DES x 1 Successful PCI of the proximal RCA with DES x 1 Plan: DAPT with ASA and Plavix for one year. Anticipate DC in am if no complications.  ? ?CARDIAC CATHETERIZATION ? ?Addendum Date: 01/11/2022   ?CONCLUSIONS: Severe two-vessel coronary disease with high-grade mid LAD on a significant bend and high-grade proximal RCA on a significant bend as well. Distal circumflex contains 60 to 70% stenosis. LVEDP is normal. Left main is normal. RECOMMENDATIONS: Need clinical correlation with the patient's presentation and the finding of coronary disease.  No enzyme changes or EKG changes are noted.  No chest discomfort.  The patient had dyspnea and felt that he might faint. We discussed PCI.  He is probably not a good surgical candidate.  I think PCI of the LAD and right coronary is probably  the appropriate therapy.  Radial arm approach was somewhat difficult but probably the preferred approach rather than femoral.  He wants to discuss with his family.  He is only caregiver for his wife who has dementia.  His sister is there with her now.  His son is traveling in from [place]. We will need to make plans about how to manage the patient's coronary disease whether or not it explains his presentation. ? ?Result Date: 01/11/2022 ?CONCLUSIONS: Severe two-vessel coronary disease with high-grade mid LAD on a significant bend and high-grade proximal RCA on a significant bend as well. Distal circumflex contains 60 to 70% stenosis. LVEDP is normal. Left main is normal. RECOMMENDATIONS: Need clinical correlation with the patient's presentation and the finding of coronary disease.  No enzyme changes or EKG changes are noted.  No chest discomfort.  The patient had dyspnea and felt that he might faint. We discussed PCI.  He is probably not a good surgical candidate.  I think PCI of the LAD and right coronary is probably the appropriate therapy.   Radial arm approach was somewhat difficult but probably the preferred approach rather than femoral.  He wants to discuss with his family.  He is only caregiver for his wife who has dementia.  His sister is there

## 2022-01-13 NOTE — Progress Notes (Signed)
Pt received coming out of bathroom. Pt appears SOB, sat on chair with SaO2 @ 88%. Pt agrees to education. Reviewed post cath care: wound care, restrictions, activity limitations and S/S to report to MD. Stressed DAPT with Plavix and ASA. Stressed medication compliance and F/U with MD. Provided handouts on low Na diet, heart healthy diet. 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 Pt referred to CRP2 program at [facility]. Unsure if he would be able to attend as he is caregiver for his wife. Reviewed activity guidelines and reviewed S/S to terminate exercise. Reviewed use of NTG. Pt has stent card which he put in his wallet. Encouraged to copy and carry it.  ? ?CARDIAC REHAB PHASE I  ? ?PRE:  Rate/Rhythm: SR/78 ? ?BP:  Sitting: 132/86 ?   ?  SaO2: 93% ? ?MODE:  Ambulation: 470 ft SaO2 on RA dropped to 86% ?                         Pt placed on O2 @ 2L/min via N/C with SaO2 = 92% ? ?POST:  Rate/Rhythm: SR 81 ? ?BP:  Sitting: 148/77 ?   ?  SaO2: 93% ? ?Pt agrees to ambulate. Pt initially walked on RA as he does at home. About half way SaO2 dropped to 86% and has SOB. Pt stopped and O2 @ 2 L/min via N/C placed. Resumed walk and pt maintained SaO2 of 92%. Pt voices feeling better walking with the O2. Pt denies dizziness or pain. Pt back to chair with brake on, tray table and call bell in reach. Report given to primary RN.  ? ?Lesly Rubenstein, MS, ACSM EP-C, CCRP ?01/13/2022 ?8:15 - 9:32 am ? ? ?

## 2022-01-14 MED FILL — Nitroglycerin IV Soln 100 MCG/ML in D5W: INTRA_ARTERIAL | Qty: 10 | Status: AC

## 2022-01-15 ENCOUNTER — Telehealth: Payer: Self-pay | Admitting: Cardiovascular Disease

## 2022-01-15 ENCOUNTER — Encounter (HOSPITAL_COMMUNITY): Payer: Self-pay | Admitting: Cardiology

## 2022-01-15 SURGERY — CORONARY STENT INTERVENTION
Anesthesia: LOCAL

## 2022-01-15 NOTE — Telephone Encounter (Signed)
TOC scheduled for 01/22/22 at 2:20pm with Coletta Memos, NP per Dr. Johnsie Cancel ?

## 2022-01-15 NOTE — Telephone Encounter (Signed)
Left message for patient to call back - go over [facility] questions from discharge from 01/13/22 at [facility] ?

## 2022-01-16 ENCOUNTER — Telehealth (HOSPITAL_COMMUNITY): Payer: Self-pay

## 2022-01-16 NOTE — Telephone Encounter (Signed)
Pt insurance is active and benefits verified through Medicare A/B. Co-pay $0.00, DED $226.00/$226.00 met, out of pocket $0.00/$0.00 met, co-insurance 20%. No pre-authorization required. Passport, 01/16/22 @ 3:04PM, BOZ#[id number] ?  ?Will contact patient to see if he is interested in the Cardiac Rehab Program. If interested, patient will need to complete follow up appt. Once completed, patient will be contacted for scheduling upon review by the RN Navigator. ?

## 2022-01-16 NOTE — Telephone Encounter (Signed)
Called patient to see if he is interested in the Cardiac Rehab Program. Patient expressed interest. Explained scheduling process and went over insurance, patient verbalized understanding. Will contact patient for scheduling once f/u has been completed.  °

## 2022-01-16 NOTE — Telephone Encounter (Signed)
Patient contacted regarding discharge from [place] on 01/13/22. ? ?Patient understands to follow up with provider cleaver on 01/22/22 at 2:20 pm at [facility] ?Patient understands discharge instructions?yes ?Patient understands medications and regiment? yes ?Patient understands to bring all medications to this visityes ? ?Ask patient:  Are you enrolled in My Chart active If no ask patient if they would like to enroll.  ? ? ? ?

## 2022-01-17 ENCOUNTER — Encounter: Payer: Self-pay | Admitting: Internal Medicine

## 2022-01-17 ENCOUNTER — Ambulatory Visit (INDEPENDENT_AMBULATORY_CARE_PROVIDER_SITE_OTHER): Payer: Medicare Other | Admitting: Internal Medicine

## 2022-01-17 VITALS — BP 126/56 | HR 77 | Temp 97.6°F | Resp 18 | Ht 74.0 in | Wt 234.5 lb

## 2022-01-17 DIAGNOSIS — I2510 Atherosclerotic heart disease of native coronary artery without angina pectoris: Secondary | ICD-10-CM

## 2022-01-17 DIAGNOSIS — I10 Essential (primary) hypertension: Secondary | ICD-10-CM

## 2022-01-17 DIAGNOSIS — J449 Chronic obstructive pulmonary disease, unspecified: Secondary | ICD-10-CM

## 2022-01-17 MED ORDER — CLOPIDOGREL BISULFATE 75 MG PO TABS: 75.0000 mg | ORAL_TABLET | Freq: Every day | ORAL | 1 refills | Status: DC

## 2022-01-17 NOTE — Progress Notes (Signed)
? ?Subjective:  ? ? Patient ID: Jonathan Leather., male    DOB: 13-Jun-1936, 86 y.o.   MRN: [id number] ? ?DOS:  01/17/2022 ?Type of visit - description: Hospital follow-up ? ?Admitted 01/11/2022, discharged 2 days later. ?Was admitted with chest pain, shortness of breath, Dx with unstable angina, 2 stents 01/04/2022. ?COPD was noted but felt to be stable, pulmonary pressures normal at cath. ? ?Since he left the hospital is feeling well. ?Did report some fatigue and DOE ?DOE is worse since he left the hospital?. ?Strongly denies any chest pain.  No lower extremity edema ?No fever chills ?No cough ?Site of cardiac catheterization is healing well (R arm). ?Using oxygen at night but also during the daytime at home (has no portable O2). ? ?Review of Systems ?See above  ? ?Past Medical History:  ?Diagnosis Date  ? CAD (coronary artery disease)   ? Two RCA stents remotely / 3rd RCA stent 2006  ? Colon polyps   ? s/p several Cscopes.  ? COPD (chronic obstructive pulmonary disease) ([place])   ? on O2, nocturnal  ? Diabetes mellitus   ? dx aprox 2009  ? ED (erectile dysfunction)   ? has a vacumm device  ? Ejection fraction   ? Hyperlipidemia   ? dx in 90s  ? Hypertension   ? dx in the 90  ? Hypogonadism male   ? PVD (peripheral vascular disease) ([place])   ? s/p stents at LE 2009, Dr Einar Gip  ? Shortness of breath   ? O2 Sat dropped to 82% walking on the treadmill, September, 2012  ? ? ?Past Surgical History:  ?Procedure Laterality Date  ? APPENDECTOMY    ? CORONARY STENT INTERVENTION N/A 01/12/2022  ? Procedure: CORONARY STENT INTERVENTION;  Surgeon: Martinique, Peter M, MD;  Location: [facility];  Service: Cardiovascular;  Laterality: N/A;  ? RIGHT/LEFT HEART CATH AND CORONARY ANGIOGRAPHY N/A 01/11/2022  ? Procedure: RIGHT/LEFT HEART CATH AND CORONARY ANGIOGRAPHY;  Surgeon: Belva Crome, MD;  Location: [facility];  Service: Cardiovascular;  Laterality: N/A;  ? TONSILLECTOMY    ? ? ?Current Outpatient Medications  ?Medication  Instructions  ? albuterol (VENTOLIN HFA) 108 (90 Base) MCG/ACT inhaler 1-2 puffs, Inhalation, Every 6 hours PRN  ? amLODipine (NORVASC) 5 mg, Oral, Daily  ? aspirin 81 mg, Oral, Daily,    ? Blood Glucose Monitoring Suppl (FREESTYLE FREEDOM LITE) w/Device KIT Use Freestyle Freedom Lite meter to check blood sugar twice daily. DX:E11.65  ? clopidogrel (PLAVIX) 75 mg, Oral, Daily with breakfast  ? empagliflozin (JARDIANCE) 10 mg, Oral, Daily  ? famotidine (PEPCID) 20 mg, Oral, 2 times daily PRN  ? glucose blood (FREESTYLE LITE) test strip Use as instructed  ? metFORMIN (GLUCOPHAGE) 500 mg, Oral, 2 times daily with meals  ? metoprolol succinate (TOPROL XL) 100 mg, Oral, Daily  ? Multiple Vitamin (MULTIVITAMIN WITH MINERALS) TABS tablet 1 tablet, Oral, Daily  ? simvastatin (ZOCOR) 20 mg, Oral, Daily  ? TRULICITY 3 QP/5.9FM SOPN INJECT 3 MG UNDER THE SKIN ONCE A WEEK  ? ? ?   ?Objective:  ? Physical Exam ?BP (!) 126/56 (BP Location: Left Arm, Patient Position: Sitting, Cuff Size: Normal)   Pulse 77   Temp 97.6 ?F (36.4 ?C) (Oral)   Resp 18   Ht '6\' 2"'  (1.88 m)   Wt 234 lb 8 oz (106.4 kg)   SpO2 95%   BMI 30.11 kg/m?  ?General:   ?Well developed, NAD, BMI  noted. Not on O2 supplements during the visit ?HEENT:  ?Normocephalic . Face symmetric, atraumatic ?Lungs:  ?CTA B ?Normal respiratory effort, no intercostal retractions, no accessory muscle use. ?Heart: RRR,  no murmur.  ?Abdomen:  ?Not distended, soft, non-tender. No rebound or rigidity.   ?Skin: Not pale. Not jaundice ?Lower extremities: no pretibial edema bilaterally  ?Neurologic:  ?alert & oriented X3.  ?Speech normal, gait appropriate for age and unassisted ?Psych--  ?Cognition and judgment appear intact.  ?Cooperative with normal attention span and concentration.  ?Behavior appropriate. ?No anxious or depressed appearing. ? ?   ?Assessment   ? ? Assessment ?DM dx ~9728, complicated by CAD, PVD, mild neuropathy. Sees Dr. Dwyane Dee ?Hypogonadism. Sees Dr.  Dwyane Dee ?HTN ?Hyperlipidemia ?COPD, nocturnal O2 prn ?Back pain, chronic: See visit 10/12/2016 ?CV: ?-CAD, Dr. Ron Parker Dr Meda Coffee ?S/p  stenting ?2 to the RCA remote past and the third RCA stent in  2006, negative stress test in 2012, LVEF 55% on echo 2015 ?-Peripheral vascular disease, stents lower extremity 2009  ?ED ?Polycythemia: Saw hematology 11-2018, felt to be due to chronic respiratory failure.  Not likely polycythemia vera ? ?PLAN    ?CAD: Admitted to hospital with CP-SOB, had 2 stents placed 01/04/2022. ?Denies any further chest pain, no shortness of breath at rest, admits to DOE. ?Exam is benign with no volume overload. ?Previously on aspirin, now on aspirin and Plavix. ?Plan:  ?CMP, CBC. ?Follow-up with cardiology as recommended in few days ?Watch for symptoms if chest pain or increase on DOE: Seek medical attention if needed ?HTN: Well-controlled on current meds ?DM: Per Endo, last A1c 7.2. ?High cholesterol: On simvastatin 20 mg, last LDL 65, statin dose not increased after admission, will leave that at cardiology discretion. ?COPD: On nocturnal oxygen, was told by the hospital team to use oxygen 24/7, he does not have a portable tank, O2 sat today is good at 95%.  Recommend to check oxygen sats at home without supplements ?Neuropathy: gaba didn't help before, removed from med list, reassess on RTC  ?RTC 4 months ?  ?

## 2022-01-17 NOTE — Patient Instructions (Signed)
Check your oxygen during the daytime without using oxygen to see how it is. ? ?Check the  blood pressure regularly ?BP GOAL is between 110/65 and  135/85. ?If it is consistently higher or lower, let me know ? ?Seek medical attention if chest pain or severe or increasing difficulty breathing ? ? ?GO TO THE LAB : Get the blood work   ? ? ?[place], [place] ?Come back for   a checkup in 4 months ?

## 2022-01-18 ENCOUNTER — Telehealth: Payer: Self-pay | Admitting: Cardiovascular Disease

## 2022-01-18 ENCOUNTER — Telehealth: Payer: Self-pay | Admitting: Internal Medicine

## 2022-01-18 LAB — CBC WITH DIFFERENTIAL/PLATELET
Basophils Absolute: 0.1 K/uL (ref 0.0–0.1)
Basophils Relative: 1 % (ref 0.0–3.0)
Eosinophils Absolute: 0.4 K/uL (ref 0.0–0.7)
Eosinophils Relative: 5.3 % — ABNORMAL HIGH (ref 0.0–5.0)
HCT: 47.4 % (ref 39.0–52.0)
Hemoglobin: 15.7 g/dL (ref 13.0–17.0)
Lymphocytes Relative: 33.3 % (ref 12.0–46.0)
Lymphs Abs: 2.5 K/uL (ref 0.7–4.0)
MCHC: 33.1 g/dL (ref 30.0–36.0)
MCV: 99.3 fl (ref 78.0–100.0)
Monocytes Absolute: 0.9 K/uL (ref 0.1–1.0)
Monocytes Relative: 11.6 % (ref 3.0–12.0)
Neutro Abs: 3.6 K/uL (ref 1.4–7.7)
Neutrophils Relative %: 48.8 % (ref 43.0–77.0)
Platelets: 256 K/uL (ref 150.0–400.0)
RBC: 4.77 Mil/uL (ref 4.22–5.81)
RDW: 15.4 % (ref 11.5–15.5)
WBC: 7.4 K/uL (ref 4.0–10.5)

## 2022-01-18 LAB — COMPREHENSIVE METABOLIC PANEL
ALT: 22 U/L (ref 0–53)
AST: 20 U/L (ref 0–37)
Albumin: 3.9 g/dL (ref 3.5–5.2)
Alkaline Phosphatase: 66 U/L (ref 39–117)
BUN: 21 mg/dL (ref 6–23)
CO2: 22 mEq/L (ref 19–32)
Calcium: 9 mg/dL (ref 8.4–10.5)
Chloride: 110 mEq/L (ref 96–112)
Creatinine, Ser: 1.38 mg/dL (ref 0.40–1.50)
GFR: 47.16 mL/min — ABNORMAL LOW (ref >60.00)
Glucose, Bld: 151 mg/dL — ABNORMAL HIGH (ref 70–99)
Potassium: 3.8 mEq/L (ref 3.5–5.1)
Sodium: 143 mEq/L (ref 135–145)
Total Bilirubin: 0.4 mg/dL (ref 0.2–1.2)
Total Protein: 6.5 g/dL (ref 6.0–8.3)

## 2022-01-18 NOTE — Telephone Encounter (Signed)
Patient states her was recently put on oxygen and is calling to find out if he can get a light weight oxygen tank for when he needs to leave the house. Patient has an upcoming appointment 5/8. ?

## 2022-01-18 NOTE — Telephone Encounter (Signed)
Spoke with pt regarding a more portable oxygen tank. Advised pt to reach out to his pulmonologist, or provider that originally helped him get his oxygen, this would be who could help him get another tank. Pt verbalizes understanding. Pt is scheduled to see Coletta Memos, NP on Monday for TOC at 2:20pm, pt plans to come to this appointment.  ?

## 2022-01-18 NOTE — Assessment & Plan Note (Signed)
CAD: Admitted to hospital with CP-SOB, had 2 stents placed 01/04/2022. ?Denies any further chest pain, no shortness of breath at rest, admits to DOE. ?Exam is benign with no volume overload. ?Previously on aspirin, now on aspirin and Plavix. ?Plan:  ?CMP, CBC. ?Follow-up with cardiology as recommended in few days ?Watch for symptoms if chest pain or increase on DOE: Seek medical attention if needed ?HTN: Well-controlled on current meds ?DM: Per Endo, last A1c 7.2. ?High cholesterol: On simvastatin 20 mg, last LDL 65, statin dose not increased after admission, will leave that at cardiology discretion. ?COPD: On nocturnal oxygen, was told by the hospital team to use oxygen 24/7, he does not have a portable tank, O2 sat today is good at 95%.  Recommend to check oxygen sats at home without supplements ?Neuropathy: gaba didn't help before, removed from med list, reassess on RTC  ?RTC 4 months ?  ?

## 2022-01-18 NOTE — Telephone Encounter (Signed)
Spoke to patient.  ?He stated that he was recently discharged for the hospital. He would like a light weight POC.  ?He stated that his POC that he currently has weighs too much. He is currently on 2L cont. ?DME:[facility] ? ? ?Dr. Annamaria Boots, please advise. Thanks ? ? ?

## 2022-01-19 NOTE — Progress Notes (Signed)
? ?Cardiology Clinic Note  ? ?Patient Name: Jonathan [name]. ?Date of Encounter: 01/22/2022 ? ?Primary Care Provider:  Colon Branch, MD ?Primary Cardiologist:  Quay Burow, MD ? ?Patient Profile  ?  ?Jonathan [name]. 86 year old male presents to the clinic today for follow-up evaluation of his PAD and coronary artery disease. ? ?Past Medical History  ?  ?Past Medical History:  ?Diagnosis Date  ? CAD (coronary artery disease)   ? Two RCA stents remotely / 3rd RCA stent 2006  ? Colon polyps   ? s/p several Cscopes.  ? COPD (chronic obstructive pulmonary disease) ([place])   ? on O2, nocturnal  ? Diabetes mellitus   ? dx aprox 2009  ? ED (erectile dysfunction)   ? has a vacumm device  ? Ejection fraction   ? Hyperlipidemia   ? dx in 90s  ? Hypertension   ? dx in the 90  ? Hypogonadism male   ? PVD (peripheral vascular disease) ([place])   ? s/p stents at LE 2009, Dr Einar Gip  ? Shortness of breath   ? O2 Sat dropped to 82% walking on the treadmill, September, 2012  ? ?Past Surgical History:  ?Procedure Laterality Date  ? APPENDECTOMY    ? CORONARY STENT INTERVENTION N/A 01/12/2022  ? Procedure: CORONARY STENT INTERVENTION;  Surgeon: Martinique, Peter M, MD;  Location: [facility];  Service: Cardiovascular;  Laterality: N/A;  ? RIGHT/LEFT HEART CATH AND CORONARY ANGIOGRAPHY N/A 01/11/2022  ? Procedure: RIGHT/LEFT HEART CATH AND CORONARY ANGIOGRAPHY;  Surgeon: Belva Crome, MD;  Location: [facility];  Service: Cardiovascular;  Laterality: N/A;  ? TONSILLECTOMY    ? ? ?Allergies ? ?No Known Allergies ? ?History of Present Illness  ?  ?Jonathan [name]. is a PMH of coronary artery disease status post cardiac catheterization with PCI and stenting x2 to his RCA as well as repeat cardiac catheterization with the third stent placed in 2006.  He underwent stress testing in 2012 which was negative.  His echocardiogram showed an LVEF of 55% 2015.  His PMH also includes essential hypertension, hyperlipidemia, diabetes,  COPD, and peripheral arterial disease status post lower extremity stenting 2009.  His Plavix was discontinued due to GI bleed. ? ?He was seen by Dr. Meda Coffee 05/18/2020.  During that time he denied chills.  No shortness of breath.  He remained active.  He was caring for his wife with dementia.  He did report claudication in his lower extremities that was waking him up at night.  He reported compliance with his medications and denies side effects. ? ?He was admitted to the hospital on 01/11/2022 and discharged on 01/13/2022.  He was admitted with hypoxia and profound dyspnea.  He was not noted to have EKG changes and his troponins were negative.  Cardiology was consulted and shared decision-making was used to decide to progress with right and left catheterization.  His cardiac catheterization showed proximal LAD 90% stenosis and proximal RCA 75% stenosis.  He received PCI with DES x2 to his LAD and RCA.  He was noted to have 2 separate areas of circumflex stenosis 70% proximal and 70% distal. ? ?He presents to the clinic today for follow-up evaluation states he feels well today.  He has had no further episodes of chest discomfort.  He continues to have trouble with his breathing.  He will be visiting the [place] to see if they will help him cover home oxygen.  He reports that  he would not be able to use oxygen bottles very easily due to his frequent travel.  He is hopeful that he will be given assistance for an oxygen concentrating machine.  His right radial site is clean dry and no signs of infection.  I will give him the heart healthy diet, have him increase his physical activity as tolerated, send him to the pharmacy lipid clinic, and plan follow-up in 3 to 4 months. ? ?Today he denies chest pain, shortness of breath, lower extremity edema, fatigue, palpitations, melena, hematuria, hemoptysis, diaphoresis, weakness, presyncope, syncope, orthopnea, and PND. ? ? ?Home Medications  ?  ?Prior to Admission medications    ?Medication Sig Start Date End Date Taking? Authorizing Provider  ?albuterol (VENTOLIN HFA) 108 (90 Base) MCG/ACT inhaler Inhale 1-2 puffs into the lungs every 6 (six) hours as needed for wheezing or shortness of breath.    [provider]  ?amLODipine (NORVASC) 5 MG tablet Take 1 tablet (5 mg total) by mouth daily. 06/28/20   Colon Branch, MD  ?aspirin 81 MG tablet Take 81 mg by mouth daily.    [provider]  ?Blood Glucose Monitoring Suppl (FREESTYLE FREEDOM LITE) w/Device KIT Use Freestyle Freedom Lite meter to check blood sugar twice daily. DX:E11.65 11/17/21   Elayne Snare, MD  ?clopidogrel (PLAVIX) 75 MG tablet Take 1 tablet (75 mg total) by mouth daily with breakfast. 01/17/22   Colon Branch, MD  ?empagliflozin (JARDIANCE) 10 MG TABS tablet Take 10 mg by mouth daily.    [provider]  ?famotidine (PEPCID) 20 MG tablet Take 20 mg by mouth 2 (two) times daily as needed for heartburn or indigestion.    [provider]  ?glucose blood (FREESTYLE LITE) test strip Use as instructed 10/26/21   Elayne Snare, MD  ?metFORMIN (GLUCOPHAGE) 1000 MG tablet Take 0.5 tablets (500 mg total) by mouth 2 (two) times daily with a meal. 01/15/22   Nita Sells, MD  ?metoprolol succinate (TOPROL XL) 100 MG 24 hr tablet Take 1 tablet (100 mg total) by mouth daily. 07/28/18   Colon Branch, MD  ?Multiple Vitamin (MULTIVITAMIN WITH MINERALS) TABS tablet Take 1 tablet by mouth daily.    [provider]  ?simvastatin (ZOCOR) 20 MG tablet Take 20 mg by mouth daily.    [provider]  ?TRULICITY 3 BP/1.0CH SOPN INJECT 3 MG UNDER THE SKIN ONCE A WEEK ?Patient taking differently: Inject 3 mg into the skin every Saturday. 12/16/20   Elayne Snare, MD  ? ? ?Family History  ?  ?Family History  ?Problem Relation Age of Onset  ? Heart disease Father   ? Hypertension Father   ? Stroke Father   ? Diabetes Paternal Aunt   ? Diabetes Maternal Grandmother   ? Diabetes Other   ?     GM, nephews, many  family members  ? Hyperlipidemia Other   ?     ?  ? Prostate cancer Brother   ? Colon cancer Neg Hx   ? ?He indicated that his mother is deceased. He indicated that his father is deceased. He indicated that his brother is deceased. He indicated that his maternal grandmother is deceased. He indicated that his paternal aunt is alive. He indicated that the status of his neg hx is unknown. ? ?Social History  ?  ?Social History  ? ?Socioeconomic History  ? Marital status: Married  ?  Spouse name: Cerrella   ? Number of children: 6  ? Years  of education: Not on file  ? Highest education level: Not on file  ?Occupational History  ? Occupation: retired, still preaches   ?  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 Smoking status: Former  ?  Packs/day: 2.00  ?  Years: 30.00  ?  Pack years: 60.00  ?  Types: Cigarettes  ?  Quit date: 06/17/1977  ?  Years since quitting: 44.6  ? Smokeless tobacco: Never  ? Tobacco comments:  ?  2 ppd, quit 1978  ?Vaping Use  ? Vaping Use: Never used  ?Substance and Sexual Activity  ? Alcohol use: Yes  ?  Alcohol/week: 0.0 standard drinks  ?  Comment: socially   ? Drug use: No  ? Sexual activity: Yes  ?Other Topics Concern  ? Not on file  ?Social History Narrative  ? 4 children (lost 1 son)  ? Wife has 2 children  ?    ?   ?   ?   ? ?Social Determinants of Health  ? ?Financial Resource Strain: Low Risk   ? Difficulty of Paying Living Expenses: Not hard at all  ?Food Insecurity: No Food Insecurity  ? Worried About Charity fundraiser in the Last Year: Never true  ? Ran Out of Food in the Last Year: Never true  ?Transportation Needs: No Transportation Needs  ? Lack of Transportation (Medical): No  ? Lack of Transportation (Non-Medical): No  ?Physical Activity: Inactive  ? Days of Exercise per Week: 0 days  ? Minutes of Exercise per Session: 0 min  ?Stress: No Stress Concern Present  ? Feeling of Stress : Not at all  ?Social Connections: Socially Integrated  ? Frequency of Communication with Friends  and Family: More than three times a week  ? Frequency of Social Gatherings with Friends and Family: More than three times a week  ? Attends Religious Services: More than 4 times per year  ? Active Member of Clubs or

## 2022-01-19 NOTE — Telephone Encounter (Signed)
Please suggest Jonathan [name] contact his DME company and ask them what they have for portable O2 lighter than what he currently has. We can prescribe it if they have what he wants. ?

## 2022-01-19 NOTE — Telephone Encounter (Signed)
Called and spoke with pt letting him know recs per CY and he verbalized understanding. Provided pt phone number for [facility]. Nothing further needed. ?

## 2022-01-22 ENCOUNTER — Other Ambulatory Visit: Payer: Self-pay | Admitting: Endocrinology

## 2022-01-22 ENCOUNTER — Encounter: Payer: Self-pay | Admitting: General Practice

## 2022-01-22 ENCOUNTER — Ambulatory Visit (INDEPENDENT_AMBULATORY_CARE_PROVIDER_SITE_OTHER): Payer: Medicare Other | Admitting: General Practice

## 2022-01-22 VITALS — BP 138/80 | HR 94 | Ht 74.0 in | Wt 232.0 lb

## 2022-01-22 DIAGNOSIS — I10 Essential (primary) hypertension: Secondary | ICD-10-CM

## 2022-01-22 DIAGNOSIS — I739 Peripheral vascular disease, unspecified: Secondary | ICD-10-CM

## 2022-01-22 DIAGNOSIS — I2510 Atherosclerotic heart disease of native coronary artery without angina pectoris: Secondary | ICD-10-CM | POA: Diagnosis not present

## 2022-01-22 DIAGNOSIS — E785 Hyperlipidemia, unspecified: Secondary | ICD-10-CM | POA: Diagnosis not present

## 2022-01-22 DIAGNOSIS — Z20822 Contact with and (suspected) exposure to covid-19: Secondary | ICD-10-CM | POA: Diagnosis not present

## 2022-01-22 NOTE — Patient Instructions (Addendum)
Medication Instructions:  ?Your physician recommends that you continue on your current medications as directed. Please refer to the Current Medication list given to you today. ?*If you need a refill on your cardiac medications before your next appointment, please call your pharmacy* ? ? ?Lab Work: ?None Ordered ? ? ?Testing/Procedures: ?None ordered ? ? ?Follow-Up: ?At [facility], you and your health needs are our priority.  As part of our continuing mission to provide you with exceptional heart care, we have created designated Provider Care Teams.  These Care Teams include your primary Cardiologist (physician) and Advanced Practice Providers (APPs -  Physician Assistants and Nurse Practitioners) who all work together to provide you with the care you need, when you need it. ? ?We recommend signing up for the patient portal called "MyChart".  Sign up information is provided on this After Visit Summary.  MyChart is used to connect with patients for Virtual Visits (Telemedicine).  Patients are able to view lab/test results, encounter notes, upcoming appointments, etc.  Non-urgent messages can be sent to your provider as well.   ?To learn more about what you can do with MyChart, go to [email or web address].   ? ?Your next appointment:   ?3-4 month(s) ? ?The format for your next appointment:   ?In Person ? ?Provider:   ?Quay Burow, MD   ? ? ?Other Instructions ?High fiber diet attached ?Increase physical activity as tolerated ?Needs appointment in lipid clinic ? ?Important Information About Sugar ? ? ? ? ?  ?

## 2022-01-23 ENCOUNTER — Telehealth: Payer: Self-pay | Admitting: Pharmacist

## 2022-01-23 ENCOUNTER — Ambulatory Visit (INDEPENDENT_AMBULATORY_CARE_PROVIDER_SITE_OTHER): Payer: Medicare Other | Admitting: Pharmacist

## 2022-01-23 VITALS — BP 161/84 | HR 78 | Resp 17 | Ht 74.0 in | Wt 232.6 lb

## 2022-01-23 DIAGNOSIS — I739 Peripheral vascular disease, unspecified: Secondary | ICD-10-CM

## 2022-01-23 DIAGNOSIS — E782 Mixed hyperlipidemia: Secondary | ICD-10-CM

## 2022-01-23 DIAGNOSIS — I2510 Atherosclerotic heart disease of native coronary artery without angina pectoris: Secondary | ICD-10-CM | POA: Diagnosis not present

## 2022-01-23 DIAGNOSIS — E1159 Type 2 diabetes mellitus with other circulatory complications: Secondary | ICD-10-CM

## 2022-01-23 MED ORDER — PRALUENT 75 MG/ML ~~LOC~~ SOAJ: 1.0000 mL | SUBCUTANEOUS | 3 refills | Status: DC

## 2022-01-23 NOTE — Progress Notes (Signed)
Patient ID: Jonathan [name].                 DOB: 11-11-1935                    MRN: [id number] ? ? ? ? ?HPI: ?Jonathan [name]. is a 86 y.o. male patient referred to lipid clinic by Jonathan [name]. PMH is significant for HTN, CAD, PAD, COPD, angina, CKD, and T2DM (A1c 7.2 on 10/24/21).  Had cardiac catheterization April 2023.  Currently managed on simvastatin 63m daily. ? ?Patient presents today in good spirits. [place] [facility] [place] not report chest pain today. Tolerates simvastatin well and reports compliance.  ? ?Current Medications: Simvastatin 276mdaily ? ?Intolerances: N/A ? ?Risk Factors:  ?PAD ?CAD ?HTN ?T2DM ? ?LDL goal: <55 ? ?Labs: TC 117, Trigs 81, HDL 36, LDL 65 (01/12/22) ? ?Past Medical History:  ?Diagnosis Date  ? CAD (coronary artery disease)   ? Two RCA stents remotely / 3rd RCA stent 2006  ? Colon polyps   ? s/p several Cscopes.  ? [place]  ? on O2, nocturnal  ? Diabetes mellitus   ? dx aprox 2009  ? ED (erectile dysfunction)   ? has a vacumm device  ? Ejection fraction   ? Hyperlipidemia   ? dx in 90s  ? Hypertension   ? dx in the 90  ? Hypogonadism male   ? [place]  ? s/p stents at LE 2009, Dr GaEinar Gip? Shortness of breath   ? O2 Sat dropped to 82% walking on the treadmill, September, 2012  ? ? ?Current Outpatient Medications on File Prior to Visit  ?Medication Sig Dispense Refill  ? albuterol (VENTOLIN HFA) 108 (90 Base) MCG/ACT inhaler Inhale 1-2 puffs into the lungs every 6 (six) hours as needed for wheezing or shortness of breath.    ? amLODipine (NORVASC) 5 MG tablet Take 1 tablet (5 mg total) by mouth daily. 90 tablet 3  ? aspirin 81 MG tablet Take 81 mg by mouth daily.    ? Blood Glucose Monitoring Suppl (FREESTYLE FREEDOM LITE) w/Device KIT Use Freestyle Freedom Lite meter to check blood sugar twice daily. DX:E11.65 1 kit 0  ? clopidogrel (PLAVIX) 75 MG tablet Take 1 tablet (75 mg total) by mouth  daily with breakfast. 90 tablet 1  ? empagliflozin (JARDIANCE) 10 MG TABS tablet Take 10 mg by mouth daily.    ? famotidine (PEPCID) 20 MG tablet Take 20 mg by mouth 2 (two) times daily as needed for heartburn or indigestion.    ? glucose blood (FREESTYLE LITE) test strip Use as instructed 100 each 6  ? metFORMIN (GLUCOPHAGE) 1000 MG tablet Take 0.5 tablets (500 mg total) by mouth 2 (two) times daily with a meal.    ? metoprolol succinate (TOPROL XL) 100 MG 24 hr tablet Take 1 tablet (100 mg total) by mouth daily. 90 tablet 2  ? Multiple Vitamin (MULTIVITAMIN WITH MINERALS) TABS tablet Take 1 tablet by mouth daily.    ? simvastatin (ZOCOR) 20 MG tablet Take 20 mg by mouth daily.    ? TRULICITY 3 MGVF/6.4PPOPN INJECT 3 MG UNDER THE SKIN ONCE A WEEK 6 mL 3  ? ?No current facility-administered medications on file prior to visit.  ? ? ?No Known Allergies ? ?Assessment/Plan: ? ?1. Hyperlipidemia - Patient current LDL 65 which is above goal of <  20. Aggressive goal due to PVD, CAD, and T2DM.  Since patient has not reached goal on statin therapy, recommended starting on PCSK9i.  Using demo pen, educated on mechanism of action, storage, site selection, and administration. Patient voiced understanding. Will complete PA and contact patient when approved. Recheck lipid panel in 2-3 months. ? ?Continue simvastatin 22m daily ?Start Repatha/Praluent sq q 14 days ?Recheck lipid panel in 2-3 months ? ?CKarren Cobble PharmD, BCACP, [place] CPP ?[address] ?[place] [address]?Phone: [phone] Fax: [phone] ?  ?

## 2022-01-23 NOTE — Telephone Encounter (Signed)
Praluent Rx sent to [facility] ?

## 2022-01-23 NOTE — Telephone Encounter (Signed)
Please complete prior authorization for: ? ?Name of medication, dose, and frequency Repatha '140mg'$  sq q 14 days or Praluent 75 mg sq q 14 days ? ?Lab Orders Requested? yes ? ?Which labs? Lipid panel ? ?Estimated date for labs to be scheduled 2-3 months ? ?Does patient need activated copay card? No ? ?Patient would like medication from [place] if approved to help with cost  ?

## 2022-01-23 NOTE — Patient Instructions (Addendum)
It was nice meeting you today ? ?We would like your LDL (bad cholesterol) to be less than 55 ? ?Please continue your simvastatin '20mg'$  daily ? ?We would like to start you on a new medication called Repatha or Praluent which you will inject once every 2 weeks ? ?We will complete the prior authorization for you and contact you when it is approved ? ?Once you start the medication we will recheck your cholesterol in 2-3 months ? ?Please call with any questions! ? ?Karren Cobble, PharmD, BCACP, [place], CPP ?[address], [address] ?[place], [place], [address] ?Phone: [phone], Fax: [phone]  ?

## 2022-01-24 ENCOUNTER — Other Ambulatory Visit: Payer: Self-pay

## 2022-01-24 ENCOUNTER — Telehealth: Payer: Self-pay

## 2022-01-24 DIAGNOSIS — E1165 Type 2 diabetes mellitus with hyperglycemia: Secondary | ICD-10-CM

## 2022-01-24 MED ORDER — TRULICITY 3 MG/0.5ML ~~LOC~~ SOAJ: SUBCUTANEOUS | 3 refills | Status: DC

## 2022-01-24 NOTE — Telephone Encounter (Signed)
Patient came into office to state that Jonathan [name] will pay for his Trulicity if we send last office note and prescription to [phone] Attn: Dr Anson [place]. Prescription has been printed and awaiting Dr signature. ?

## 2022-01-28 DIAGNOSIS — R001 Bradycardia, unspecified: Secondary | ICD-10-CM | POA: Diagnosis not present

## 2022-01-28 DIAGNOSIS — R079 Chest pain, unspecified: Secondary | ICD-10-CM | POA: Diagnosis not present

## 2022-01-28 DIAGNOSIS — R0789 Other chest pain: Secondary | ICD-10-CM | POA: Diagnosis not present

## 2022-01-29 ENCOUNTER — Telehealth: Payer: Self-pay | Admitting: Internal Medicine

## 2022-01-29 ENCOUNTER — Emergency Department (HOSPITAL_COMMUNITY): Payer: Medicare Other

## 2022-01-29 ENCOUNTER — Emergency Department (HOSPITAL_COMMUNITY)
Admission: EM | Admit: 2022-01-29 | Discharge: 2022-01-29 | Disposition: A | Discharge status: Home / Self Care | Payer: Medicare Other | Attending: Emergency Medicine | Admitting: Emergency Medicine

## 2022-01-29 DIAGNOSIS — F419 Anxiety disorder, unspecified: Secondary | ICD-10-CM | POA: Diagnosis not present

## 2022-01-29 DIAGNOSIS — R0902 Hypoxemia: Secondary | ICD-10-CM | POA: Diagnosis not present

## 2022-01-29 DIAGNOSIS — R0602 Shortness of breath: Secondary | ICD-10-CM | POA: Insufficient documentation

## 2022-01-29 DIAGNOSIS — I959 Hypotension, unspecified: Secondary | ICD-10-CM | POA: Diagnosis not present

## 2022-01-29 LAB — CBC WITH DIFFERENTIAL/PLATELET
Abs Immature Granulocytes: 0.01 10*3/uL (ref 0.00–0.07)
Basophils Absolute: 0.1 10*3/uL (ref 0.0–0.1)
Basophils Relative: 1 %
Eosinophils Absolute: 0.3 10*3/uL (ref 0.0–0.5)
Eosinophils Relative: 4 %
HCT: 56 % — ABNORMAL HIGH (ref 39.0–52.0)
Hemoglobin: 18.3 g/dL — ABNORMAL HIGH (ref 13.0–17.0)
Immature Granulocytes: 0 %
Lymphocytes Relative: 46 %
Lymphs Abs: 3.4 10*3/uL (ref 0.7–4.0)
MCH: 32.1 pg (ref 26.0–34.0)
MCHC: 32.7 g/dL (ref 30.0–36.0)
MCV: 98.2 fL (ref 80.0–100.0)
Monocytes Absolute: 0.9 10*3/uL (ref 0.1–1.0)
Monocytes Relative: 12 %
Neutro Abs: 2.8 10*3/uL (ref 1.7–7.7)
Neutrophils Relative %: 37 %
Platelets: 229 10*3/uL (ref 150–400)
RBC: 5.7 MIL/uL (ref 4.22–5.81)
RDW: 14.8 % (ref 11.5–15.5)
WBC: 7.4 10*3/uL (ref 4.0–10.5)
nRBC: 0 % (ref 0.0–0.2)

## 2022-01-29 LAB — BASIC METABOLIC PANEL
Anion gap: 6 (ref 5–15)
BUN: 14 mg/dL (ref 8–23)
CO2: 25 mmol/L (ref 22–32)
Calcium: 9.1 mg/dL (ref 8.9–10.3)
Chloride: 110 mmol/L (ref 98–111)
Creatinine, Ser: 1.14 mg/dL (ref 0.61–1.24)
GFR, Estimated: >60 mL/min (ref >60)
Glucose, Bld: 96 mg/dL (ref 70–99)
Potassium: 3.7 mmol/L (ref 3.5–5.1)
Sodium: 141 mmol/L (ref 135–145)

## 2022-01-29 LAB — TROPONIN I (HIGH SENSITIVITY)
Troponin I (High Sensitivity): 6 ng/L (ref <18)
Troponin I (High Sensitivity): 6 ng/L (ref <18)

## 2022-01-29 LAB — BRAIN NATRIURETIC PEPTIDE: B Natriuretic Peptide: 43.2 pg/mL (ref 0.0–100.0)

## 2022-01-29 MED ORDER — HYDROXYZINE HCL 25 MG PO TABS: 25.0000 mg | ORAL_TABLET | Freq: Four times a day (QID) | ORAL | 0 refills | Status: DC | PRN

## 2022-01-29 NOTE — ED Notes (Signed)
Discharge instructions discussed with pt. Pt verbalized understanding with no questions at this time. Pt to go home with family at bedside 

## 2022-01-29 NOTE — Telephone Encounter (Signed)
Noted, thank you

## 2022-01-29 NOTE — Telephone Encounter (Signed)
FYI. Pt triaged to ED.  

## 2022-01-29 NOTE — Discharge Instructions (Addendum)
It was a pleasure taking care of you today! ? ?Your work-up was unremarkable in the ED today.  Call your primary care provider to set up a follow-up appointment regarding today's ED visit.  You will be sent a prescription for hydroxyzine, take as prescribed. Return to the emergency department for experiencing increasing/worsening chest pain, trouble breathing, worsening symptoms. ?

## 2022-01-29 NOTE — Telephone Encounter (Signed)
Pt states he thinks he's having an anxiety attack and shortness of breathe. Transferred to triage  ?

## 2022-01-29 NOTE — Telephone Encounter (Signed)
Nurse Assessment ?Nurse: Waymond Cera, RN, Benjamine Mola Date/Time (Eastern Time): 01/29/2022 10:44:45 AM ?Confirm and document reason for call. If ?symptomatic, describe symptoms. ?---Caller states that he is having shortness of breath. ?States currently on oxygen and stents placed 2 weeks ?ago. ?Does the patient have any new or worsening ?symptoms? ---Yes ?Will a triage be completed? ---Yes ?Related visit to physician within the last 2 weeks? ---Yes ?Does the PT have any chronic conditions? (i.e. ?diabetes, asthma, this includes High risk factors for ?pregnancy, etc.) ?---Yes ?List chronic conditions. ---COPD HTN ?Is this a behavioral health or substance abuse call? ---No ?Guidelines ?Guideline Title Affirmed Question Affirmed Notes Nurse Date/Time (Eastern ?Time) ?Cough - Acute NonProductive ?[1] MODERATE ?difficulty breathing ?(e.g., speaks in ?phrases, SOB even at ?rest, pulse 100-120) ?AND [2] still present ?when not coughing ?Cantrell, RN, ?Elizabeth ?01/29/2022 10:46:16 ?AM ?PLEASE NOTE: All timestamps contained within this report are represented as [place] Standard Time. ?CONFIDENTIALTY NOTICE: This fax transmission is intended only for the addressee. It contains information that is legally privileged, confidential or ?otherwise protected from use or disclosure. If you are not the intended recipient, you are strictly prohibited from reviewing, disclosing, copying using ?or disseminating any of this information or taking any action in reliance on or regarding this information. If you have received this fax in error, please ?notify us immediately by telephone so that we can arrange for its return to [place]. Phone: [phone], Toll-Free: [phone], Fax: [phone] ?Page: 2 of 2 ?Call Id: [id number] ?Disp. Time (Eastern ?Time) Disposition Final User ?01/29/2022 10:43:34 AM Send to Urgent Mikael Spray ?01/29/2022 10:48:58 AM Go to ED Now Yes Cantrell, RN, Benjamine Mola ?Caller Disagree/Comply Comply ?Caller Understands  Yes ?PreDisposition InappropriateToAsk ?Care Advice Given Per Guideline ?GO TO ED NOW: * You need to be seen in the Emergency Department. CALL EMS 911 IF: * Severe difficulty breathing occurs * ?Lips or face turns blue * Confusion occurs. CARE ADVICE given per Cough - Acute Non-Productive (Adult) guideline. ANOTHER ?ADULT SHOULD DRIVE: * It is better and safer if another adult drives instead of you. BRING MEDICINES: * Bring a list of your ?current medicines when you go to the Emergency Department (ER). * Bring the pill bottles too. This will help the doctor (or NP/PA) ?to make certain you are taking the right medicines and the right dose. ?Comments ?User: Sharol Given, RN Date/Time Eilene Ghazi Time): 01/29/2022 10:50:57 AM ?Caller states he has to find someone to stay with his wife while he goes to the ER. States that he will go if he can ?find someone. Advised risk of delaying care or not going is getting worse/possible death. Also advised if he got ?CP to call 911. Verbalizes understanding. ?Referrals ?[facility] - ED ?

## 2022-01-29 NOTE — ED Triage Notes (Addendum)
Pt arrived via [facility] from home.  ? ?C/o shob and anxiety when he is alone. When pt is with a group of people he is fine.  ? ? ?Pt had 2 cardiac stents placed 2 weeks ago.  ? ? ?EKG- 1st degree heart block per ems.  ? ?Pt reports he wears oxygen at home when needed.  ? ?92% RA in triage. Pt is in no respiratory distress and able to speak in complete sentences.  ?

## 2022-01-29 NOTE — ED Provider Triage Note (Signed)
Emergency Medicine Provider Triage Evaluation Note ? ?Jonathan [name] , a 86 y.o. male  was evaluated in triage.  Pt complains of shortness of breath.  Patient notes that he wears 2 L oxygen at home as needed.  Initial O2 saturation at 92%.  Patient denies feeling short of breath at this time. Patient was admitted to the hospital on 01/11/2022-01/13/2022 for dyspnea and hypoxia.  At that time he had a catheterization completed with stents placed.  He has since been seen by his cardiologist on 5/8 and 5/9. Denies chest pain, fever, chills, nausea, vomiting. ? ? ? ?Review of Systems  ?Positive: As per HPI above ?Negative:  ? ?Physical Exam  ?BP (!) 147/71   Pulse 70   Temp 97.8 ?F (36.6 ?C) (Oral)   Resp 18   SpO2 92%  ?Gen:   Awake, no distress   ?Resp:  Normal effort  ?MSK:   Moves extremities without difficulty  ?Other:  No chest wall TTP.  ? ?Medical Decision Making  ?Medically screening exam initiated at 12:52 PM.  Appropriate orders placed.  Jonathan [name]. was informed that the remainder of the evaluation will be completed by another provider, this initial triage assessment does not replace that evaluation, and the importance of remaining in the ED until their evaluation is complete. ? ?Work-up initiated ?  ?Stefanos Haynesworth A, PA-C ?01/29/22 1255 ? ?

## 2022-01-30 ENCOUNTER — Ambulatory Visit (INDEPENDENT_AMBULATORY_CARE_PROVIDER_SITE_OTHER): Payer: Medicare Other | Admitting: Internal Medicine

## 2022-01-30 ENCOUNTER — Encounter: Payer: Self-pay | Admitting: Internal Medicine

## 2022-01-30 DIAGNOSIS — F32A Depression, unspecified: Secondary | ICD-10-CM | POA: Diagnosis not present

## 2022-01-30 DIAGNOSIS — F410 Panic disorder [episodic paroxysmal anxiety] without agoraphobia: Secondary | ICD-10-CM | POA: Diagnosis not present

## 2022-01-30 DIAGNOSIS — F419 Anxiety disorder, unspecified: Secondary | ICD-10-CM

## 2022-01-30 DIAGNOSIS — I2510 Atherosclerotic heart disease of native coronary artery without angina pectoris: Secondary | ICD-10-CM | POA: Diagnosis not present

## 2022-01-30 NOTE — Progress Notes (Signed)
? ?Subjective:  ? ? Patient ID: Jonathan Leather., male    DOB: 05-Apr-1936, 86 y.o.   MRN: [id number] ? ?DOS:  01/30/2022 ?Type of visit - description: ER follow-up ? ?In the last 2 to 3 days he has developed difficulty breathing. ?Reports that while he is distracting and talking to people he feels fine, once he is alone with his wife (she has dementia) he gets really anxious, agitated, starts to feel short of breath and feels hte need to get out of the house. ? ?Denies any chest pain per se. ?No suicidal ideas. ? ?With the symptoms as above, went to the ER yesterday, work-up reviewed: ?BMP, BNP, troponin, CBC: Essentially normal, hemoglobin increased at 18.3. ?Chest x-ray okay. ?EKG: At the ER compared to previous EKGs - No change ? ?He was prescribed hydroxyzine and has taking it 1 time with some help and without getting sleep ? ?Review of Systems ?See above  ? ?Past Medical History:  ?Diagnosis Date  ? CAD (coronary artery disease)   ? Two RCA stents remotely / 3rd RCA stent 2006  ? Colon polyps   ? s/p several Cscopes.  ? COPD (chronic obstructive pulmonary disease) ([place])   ? on O2, nocturnal  ? Diabetes mellitus   ? dx aprox 2009  ? ED (erectile dysfunction)   ? has a vacumm device  ? Ejection fraction   ? Hyperlipidemia   ? dx in 90s  ? Hypertension   ? dx in the 90  ? Hypogonadism male   ? PVD (peripheral vascular disease) ([place])   ? s/p stents at LE 2009, Dr Einar Gip  ? Shortness of breath   ? O2 Sat dropped to 82% walking on the treadmill, September, 2012  ? ? ?Past Surgical History:  ?Procedure Laterality Date  ? APPENDECTOMY    ? CORONARY STENT INTERVENTION N/A 01/12/2022  ? Procedure: CORONARY STENT INTERVENTION;  Surgeon: Martinique, Peter M, MD;  Location: [facility];  Service: Cardiovascular;  Laterality: N/A;  ? RIGHT/LEFT HEART CATH AND CORONARY ANGIOGRAPHY N/A 01/11/2022  ? Procedure: RIGHT/LEFT HEART CATH AND CORONARY ANGIOGRAPHY;  Surgeon: Belva Crome, MD;  Location: [place];  Service:  Cardiovascular;  Laterality: N/A;  ? TONSILLECTOMY    ? ? ?Current Outpatient Medications  ?Medication Instructions  ? albuterol (VENTOLIN HFA) 108 (90 Base) MCG/ACT inhaler 1-2 puffs, Inhalation, Every 6 hours PRN  ? Alirocumab (PRALUENT) 75 MG/ML SOAJ 1 mL, Subcutaneous, Every 14 days  ? amLODipine (NORVASC) 5 mg, Oral, Daily  ? aspirin 81 mg, Oral, Daily,    ? Blood Glucose Monitoring Suppl (FREESTYLE FREEDOM LITE) w/Device KIT Use Freestyle Freedom Lite meter to check blood sugar twice daily. DX:E11.65  ? clopidogrel (PLAVIX) 75 mg, Oral, Daily with breakfast  ? Dulaglutide (TRULICITY) 3 CB/7.6EG SOPN INJECT 3 MG UNDER THE SKIN ONCE A WEEK  ? empagliflozin (JARDIANCE) 10 mg, Oral, Daily  ? famotidine (PEPCID) 20 mg, Oral, 2 times daily PRN  ? glucose blood (FREESTYLE LITE) test strip Use as instructed  ? hydrOXYzine (ATARAX) 25 mg, Oral, Every 6 hours PRN  ? metFORMIN (GLUCOPHAGE) 500 mg, Oral, 2 times daily with meals  ? metoprolol succinate (TOPROL XL) 100 mg, Oral, Daily  ? Multiple Vitamin (MULTIVITAMIN WITH MINERALS) TABS tablet 1 tablet, Oral, Daily  ? simvastatin (ZOCOR) 20 mg, Oral, Daily  ? ? ?   ?Objective:  ? Physical Exam ?BP 132/80 (BP Location: Left Arm, Patient Position: Sitting, Cuff Size: Normal)  Pulse 75   Temp 98 ?F (36.7 ?C) (Oral)   Resp 18   Ht _0  (1.88 m)   Wt 227 lb 8 oz (103.2 kg)   SpO2 99%   BMI 29.21 kg/m?  ?General:   ?Well developed, NAD, BMI noted. ?HEENT:  ?Normocephalic . Face symmetric, atraumatic ?Lower extremities: no pretibial edema bilaterally  ?Skin: Not pale. Not jaundice ?Neurologic:  ?alert & oriented X3.  ?Speech normal, gait appropriate for age and unassisted ?Psych--  ?Cognition and judgment appear intact.  ?Cooperative with normal attention span and concentration.  ?Behavior appropriate. ?We had a long conversation today, during few moments he was tearful. ? ?   ?Assessment   ? ?  Assessment ?DM dx ~5465, complicated by CAD, PVD, mild neuropathy. Sees Dr.  Dwyane Dee ?Hypogonadism. Sees Dr. Dwyane Dee ?HTN ?Hyperlipidemia ?COPD, nocturnal O2 prn ?Back pain, chronic: See visit 10/12/2016 ?CV: ?-CAD, Dr. Ron Parker Dr Meda Coffee ?S/p  stenting ?2 to the RCA remote past and the third RCA stent in  2006, negative stress test in 2012, LVEF 55% on echo 2015 ?-Peripheral vascular disease, stents lower extremity 2009  ?ED ?Polycythemia: Saw hematology 11-2018, felt to be due to chronic respiratory failure.  Not likely polycythemia vera ? ?PLAN    ?CAD ?Had 2 stents placed 01/04/2022, subsequently was seen by me 01/17/2022 and by cardiology 01/22/2022.  Was felt to be stable. ?Went to the ER with shortness of breath, work-up negative except for slightly increased hemoglobin. ?I do not believe symptoms are cardiac.  See next ?Anxiety depression, panic attacks : ?Today the patient reports anxiety and some depression, he is the caregiver for his wife, she has advanced dementia. ?As long as he is busy, he is okay, once he is alone or has downtime he gets anxious and agitated. ?The ER prescribed hydroxyzine, he tried one time today and seemed to work. ?Starting this week, he will have somebody to help him taking care of his wife twice weekly. ?Has plans to sell his house and moved to an assisted living facility. ?We talk about the need for counseling and he believes will be able to get counseling from the [place]. ?We talk about other medication and at this point we agreed to try hydroxyzine as needed for anxiety, insomnia, panic.  Watch for excessive somnolence. ?Will RF hydroxyzine if it works . ?He verbalized understanding of above. ?Polycythemia: Hemoglobin in the ER slightly elevated, recheck at the next opportunity ?  ?

## 2022-01-30 NOTE — Patient Instructions (Signed)
Take hydroxyzine as prescribed in the emergency room. ?Take it as needed for anxiety and difficulty sleeping. ? ?If you feel it is helping you without making you too sleepy, call for a refill. ? ?It is important you talk with a counselor. ? ?If your symptoms get worse please reach out to me. ?

## 2022-01-31 DIAGNOSIS — F410 Panic disorder [episodic paroxysmal anxiety] without agoraphobia: Secondary | ICD-10-CM | POA: Insufficient documentation

## 2022-01-31 DIAGNOSIS — F32A Depression, unspecified: Secondary | ICD-10-CM | POA: Insufficient documentation

## 2022-01-31 DIAGNOSIS — F419 Anxiety disorder, unspecified: Secondary | ICD-10-CM | POA: Insufficient documentation

## 2022-01-31 NOTE — Assessment & Plan Note (Signed)
CAD ?Had 2 stents placed 01/04/2022, subsequently was seen by me 01/17/2022 and by cardiology 01/22/2022.  Was felt to be stable. ?Went to the ER with shortness of breath, work-up negative except for slightly increased hemoglobin. ?I do not believe symptoms are cardiac.  See next ?Anxiety depression, panic attacks : ?Today the patient reports anxiety and some depression, he is the caregiver for his wife, she has advanced dementia. ?As long as he is busy, he is okay, once he is alone or has downtime he gets anxious and agitated. ?The ER prescribed hydroxyzine, he tried one time today and seemed to work. ?Starting this week, he will have somebody to help him taking care of his wife twice weekly. ?Has plans to sell his house and moved to an assisted living facility. ?We talk about the need for counseling and he believes will be able to get counseling from the [place]. ?We talk about other medication and at this point we agreed to try hydroxyzine as needed for anxiety, insomnia, panic.  Watch for excessive somnolence. ?Will RF hydroxyzine if it works . ?He verbalized understanding of above. ?Polycythemia: Hemoglobin in the ER slightly elevated, recheck at the next opportunity ?

## 2022-02-16 ENCOUNTER — Telehealth: Payer: Self-pay | Admitting: Internal Medicine

## 2022-02-16 NOTE — Telephone Encounter (Signed)
Patient called back- gave him [employer]/ [facility]PS number [phone]

## 2022-02-19 ENCOUNTER — Telehealth: Payer: Self-pay | Admitting: Internal Medicine

## 2022-02-19 MED ORDER — HYDROXYZINE HCL 25 MG PO TABS: 25.0000 mg | ORAL_TABLET | Freq: Four times a day (QID) | ORAL | 0 refills | Status: DC | PRN

## 2022-02-19 NOTE — Telephone Encounter (Signed)
Rx sent 

## 2022-02-19 NOTE — Telephone Encounter (Signed)
Pt stated he was advised by pcp to call for refills on this rx.   Medication: hydrOXYzine (ATARAX) 25 MG tablet   Has the patient contacted their pharmacy? No.  Preferred Pharmacy:  [name], [place]   [address] [place] [address]  Phone:  [phone]  Fax:  [phone]

## 2022-02-23 ENCOUNTER — Telehealth (HOSPITAL_COMMUNITY): Payer: Self-pay

## 2022-02-23 NOTE — Telephone Encounter (Signed)
Pt is not interested in the cardiac rehab program. Closed referral 

## 2022-03-08 ENCOUNTER — Ambulatory Visit: Payer: Medicare Other | Admitting: Internal Medicine

## 2022-04-09 ENCOUNTER — Other Ambulatory Visit (INDEPENDENT_AMBULATORY_CARE_PROVIDER_SITE_OTHER): Payer: Medicare Other

## 2022-04-09 DIAGNOSIS — E1165 Type 2 diabetes mellitus with hyperglycemia: Secondary | ICD-10-CM

## 2022-04-09 LAB — HEMOGLOBIN A1C: Hgb A1c MFr Bld: 7.1 % — ABNORMAL HIGH (ref 4.6–6.5)

## 2022-04-09 LAB — MICROALBUMIN / CREATININE URINE RATIO
Creatinine,U: 79 mg/dL
Microalb Creat Ratio: 8.1 mg/g (ref 0.0–30.0)
Microalb, Ur: 6.4 mg/dL — ABNORMAL HIGH (ref 0.0–1.9)

## 2022-04-09 LAB — BASIC METABOLIC PANEL WITH GFR
BUN: 16 mg/dL (ref 6–23)
CO2: 24 meq/L (ref 19–32)
Calcium: 9.4 mg/dL (ref 8.4–10.5)
Chloride: 108 meq/L (ref 96–112)
Creatinine, Ser: 1.29 mg/dL (ref 0.40–1.50)
GFR: 51.05 mL/min — ABNORMAL LOW (ref >60.00)
Glucose, Bld: 171 mg/dL — ABNORMAL HIGH (ref 70–99)
Potassium: 3.7 meq/L (ref 3.5–5.1)
Sodium: 139 meq/L (ref 135–145)

## 2022-04-11 ENCOUNTER — Encounter: Payer: Self-pay | Admitting: Internal Medicine

## 2022-04-11 ENCOUNTER — Ambulatory Visit (INDEPENDENT_AMBULATORY_CARE_PROVIDER_SITE_OTHER): Payer: Medicare Other | Admitting: Internal Medicine

## 2022-04-11 VITALS — BP 136/72 | HR 68 | Temp 98.0°F | Resp 18 | Ht 74.0 in | Wt 230.0 lb

## 2022-04-11 DIAGNOSIS — M7022 Olecranon bursitis, left elbow: Secondary | ICD-10-CM

## 2022-04-11 DIAGNOSIS — I2510 Atherosclerotic heart disease of native coronary artery without angina pectoris: Secondary | ICD-10-CM | POA: Diagnosis not present

## 2022-04-11 DIAGNOSIS — E1140 Type 2 diabetes mellitus with diabetic neuropathy, unspecified: Secondary | ICD-10-CM

## 2022-04-11 NOTE — Patient Instructions (Addendum)
Please mail or drop off a copy of your Healthcare Power of [place].   See you in September

## 2022-04-11 NOTE — Progress Notes (Signed)
Subjective:    Patient ID: Jonathan [name]., male    DOB: 1936/07/28, 86 y.o.   MRN: [id number]  DOS:  04/11/2022 Type of visit - description: acute  On June 30, he had a fall while taking a shower, a week later, noted a lump at the left elbow. Denies any head or neck injury.  He actually did not think he hit the elbow as he did not have any pain or difficulty with range of motion. Denies any fever, discharge from the elbow .  Also, long history of feet feeling cold. No claudication per se, no edema, no paresthesias.  He does have some numbness.  Review of Systems See above   Past Medical History:  Diagnosis Date   CAD (coronary artery disease)    Two RCA stents remotely / 3rd RCA stent 2006   Colon polyps    s/p several Cscopes.   COPD (chronic obstructive pulmonary disease) ([facility])    on O2, nocturnal   Diabetes mellitus    dx aprox 2009   ED (erectile dysfunction)    has a vacumm device   Ejection fraction    Hyperlipidemia    dx in 90s   Hypertension    dx in the 23   Hypogonadism male    PVD (peripheral vascular disease) ([place])    s/p stents at LE 2009, Dr Einar Gip   Shortness of breath    O2 Sat dropped to 82% walking on the treadmill, September, 2012    Past Surgical History:  Procedure Laterality Date   APPENDECTOMY     CORONARY STENT INTERVENTION N/A 01/12/2022   Procedure: CORONARY STENT INTERVENTION;  Surgeon: Martinique, Peter M, MD;  Location: [facility];  Service: Cardiovascular;  Laterality: N/A;   RIGHT/LEFT HEART CATH AND CORONARY ANGIOGRAPHY N/A 01/11/2022   Procedure: RIGHT/LEFT HEART CATH AND CORONARY ANGIOGRAPHY;  Surgeon: Belva Crome, MD;  Location: [facility];  Service: Cardiovascular;  Laterality: N/A;   TONSILLECTOMY      Current Outpatient Medications  Medication Instructions   albuterol (VENTOLIN HFA) 108 (90 Base) MCG/ACT inhaler 1-2 puffs, Inhalation, Every 6 hours PRN   Alirocumab (PRALUENT) 75 MG/ML SOAJ 1 mL,  Subcutaneous, Every 14 days   amLODipine (NORVASC) 5 mg, Oral, Daily   aspirin 81 mg, Oral, Daily,     Blood Glucose Monitoring Suppl (FREESTYLE FREEDOM LITE) w/Device KIT Use Freestyle Freedom Lite meter to check blood sugar twice daily. DX:E11.65   clopidogrel (PLAVIX) 75 mg, Oral, Daily with breakfast   Dulaglutide (TRULICITY) 3 XH/3.7JI SOPN INJECT 3 MG UNDER THE SKIN ONCE A WEEK   empagliflozin (JARDIANCE) 10 mg, Oral, Daily   famotidine (PEPCID) 20 mg, Oral, 2 times daily PRN   glucose blood (FREESTYLE LITE) test strip Use as instructed   hydrOXYzine (ATARAX) 25 mg, Oral, Every 6 hours PRN   metFORMIN (GLUCOPHAGE) 500 mg, Oral, 2 times daily with meals   metoprolol succinate (TOPROL XL) 100 mg, Oral, Daily   Multiple Vitamin (MULTIVITAMIN WITH MINERALS) TABS tablet 1 tablet, Oral, Daily   simvastatin (ZOCOR) 20 mg, Oral, Daily       Objective:   Physical Exam BP 136/72   Pulse 68   Temp 98 F (36.7 C) (Oral)   Resp 18   Ht _0  (1.88 m)   Wt 230 lb (104.3 kg)   SpO2 96%   BMI 29.53 kg/m  General:   Well developed, NAD, BMI noted. HEENT:  Normocephalic .  Face symmetric, atraumatic Upper extremities: R elbow normal L elbow: Has a olecranon bursitis approximately 4 x 5 cm.  Soft, not warm, not tender. DM foot exam: No edema, nails are very long, pinprick examination decreased distally. Feet are warm and well-perfused Skin: Not pale. Not jaundice Neurologic:  alert & oriented X3.  Speech normal, gait appropriate for age and unassisted Psych--  Cognition and judgment appear intact.  Cooperative with normal attention span and concentration.  Behavior appropriate. No anxious or depressed appearing.      Assessment      Assessment DM dx ~6605, complicated by CAD, PVD, mild DM neuropathy. Sees Dr. Dwyane Dee Hypogonadism. Sees Dr. Dwyane Dee HTN Hyperlipidemia COPD, nocturnal O2 prn Back pain, chronic: See visit 10/12/2016 CV: -CAD, Dr. Ron Parker Dr Meda Coffee S/p  stenting  2 to the RCA remote past and the third RCA stent in  2006, negative stress test in 2012, LVEF 55% on echo 2015 -Peripheral vascular disease, stents lower extremity 2009  ED Polycythemia: Saw hematology 11-2018, felt to be due to chronic respiratory failure.  Not likely polycythemia vera  PLAN    Olecranon bursitis, L: Symptoms as described above, possibly traumatic as he had a fall the week prior to noticing the swelling. We talked about observation for 2 to 3 weeks versus referral.  We agreed on observation, will refer to sports medicine if not better for possible drainage. Recommend to avoid further injury or pressure on that area, call if that become red or hot. Neuropathy: Has a history of neuropathy, complain of "cold feet" for a long time w/o evidence of PVD. Recommend nail care (goes to the podiatrist at the [facility]). RTC as  scheduled for September

## 2022-04-11 NOTE — Assessment & Plan Note (Signed)
Olecranon bursitis, L: Symptoms as described above, possibly traumatic as he had a fall the week prior to noticing the swelling. We talked about observation for 2 to 3 weeks versus referral.  We agreed on observation, will refer to sports medicine if not better for possible drainage. Recommend to avoid further injury or pressure on that area, call if that become red or hot. Neuropathy: Has a history of neuropathy, complain of "cold feet" for a long time w/o evidence of PVD. Recommend nail care (goes to the podiatrist at the [facility]). RTC as  scheduled for September

## 2022-04-13 ENCOUNTER — Encounter: Payer: Self-pay | Admitting: Endocrinology

## 2022-04-13 ENCOUNTER — Ambulatory Visit (INDEPENDENT_AMBULATORY_CARE_PROVIDER_SITE_OTHER): Payer: Medicare Other | Admitting: Endocrinology

## 2022-04-13 VITALS — BP 142/74 | HR 76 | Ht 74.0 in | Wt 230.0 lb

## 2022-04-13 DIAGNOSIS — N289 Disorder of kidney and ureter, unspecified: Secondary | ICD-10-CM | POA: Diagnosis not present

## 2022-04-13 DIAGNOSIS — I10 Essential (primary) hypertension: Secondary | ICD-10-CM

## 2022-04-13 DIAGNOSIS — I2510 Atherosclerotic heart disease of native coronary artery without angina pectoris: Secondary | ICD-10-CM

## 2022-04-13 DIAGNOSIS — E1165 Type 2 diabetes mellitus with hyperglycemia: Secondary | ICD-10-CM

## 2022-04-13 MED ORDER — ONETOUCH VERIO VI STRP
ORAL_STRIP | 12 refills | Status: DC
Start: 2022-04-13 — End: 2022-05-16

## 2022-04-13 NOTE — Progress Notes (Signed)
=         Patient ID: Jonathan [name]., male   DOB: Jul 09, 1936, 86 y.o.   MRN: [id number]    Reason for Appointment:  Follow-up of various problems  History of Present Illness:          Diagnosis: Type 2 diabetes mellitus, date of diagnosis:  2009      Past history: He is not clear when his diabetes was diagnosed but according to hospital records it may have been in 2009. Most likely he was placed on metformin initially and at some point Amaryl added. In 2013 it was also given Januvia presumably to improve his control. However appears that his A1c has been consistently over 7% Janumet was started instead of metformin and Januvia separately in 2013  He was started on Trulicity in 1/57 instead of Victoza because excessive bruising on his abdomen  Later in 01/2015 he was switched to Bydureon because of insurance noncoverage of his Trulicity  Recent history:   Non-insulin hypoglycemic drugs are: Metformin 262 1x a day, Trulicity 3.0  mg weekly, Jardiance 10 mg daily  His A1c is 7.1 compared to 7.2  Fructosamine last 399 compared to 288  Current blood sugar patterns and problems: His blood sugars have been checked very infrequently as he has had a lot of family issues  He brought his meter in today and his test trips were checked and these are expired in 8/22 However he thinks he is taking all his medications as prescribed Previously metformin had been reduced because of renal dysfunction Most of his blood sugars at home in the mornings are 156 and 163 Afternoon/evening 132-152 with overall average 163 Lab glucose was 171 fasting As before is not doing much formal exercise but just busy with household activities Also he does not think he is significantly going off his diet Usually taking Trulicity on time Weight is about the same      Side effects from medications have been: None  Glucose monitoring:  done usually once a day       Glucometer:  FreeStyle  Blood Glucose readings  as above     Meals: 3 meals per day. eating egg/meat bread for breakfast, dinner 6-7 PM     Dietician visit: Most recent: 4/16 .               Weight history:  Wt Readings from Last 3 Encounters:  04/13/22 230 lb (104.3 kg)  04/11/22 230 lb (104.3 kg)  01/30/22 227 lb 8 oz (103.2 kg)   Glycemic control:   Lab Results  Component Value Date   HGBA1C 7.1 (H) 04/09/2022   HGBA1C 7.2 (H) 10/24/2021   HGBA1C 7.2 (H) 06/27/2021   Lab Results  Component Value Date   MICROALBUR 6.4 (H) 04/09/2022   LDLCALC 65 01/12/2022   CREATININE 1.29 04/09/2022    Lab Results  Component Value Date   FRUCTOSAMINE 319 (H) 12/28/2021   FRUCTOSAMINE 288 (H) 05/24/2020   FRUCTOSAMINE 284 08/26/2019    OTHER active problems addressed today: See review of systems  Lab on 04/09/2022  Component Date Value Ref Range Status   Microalb, Ur 04/09/2022 6.4 (H)  0.0 - 1.9 mg/dL Final   Creatinine,U 04/09/2022 79.0  mg/dL Final   Microalb Creat Ratio 04/09/2022 8.1  0.0 - 30.0 mg/g Final   Sodium 04/09/2022 139  135 - 145 mEq/L Final   Potassium 04/09/2022 3.7  3.5 - 5.1 mEq/L Final   Chloride 04/09/2022  108  96 - 112 mEq/L Final   CO2 04/09/2022 24  19 - 32 mEq/L Final   Glucose, Bld 04/09/2022 171 (H)  70 - 99 mg/dL Final   BUN 04/09/2022 16  6 - 23 mg/dL Final   Creatinine, Ser 04/09/2022 1.29  0.40 - 1.50 mg/dL Final   GFR 04/09/2022 51.05 (L)  >60.00 mL/min Final   Calculated using the CKD-EPI Creatinine Equation (2021)   Calcium 04/09/2022 9.4  8.4 - 10.5 mg/dL Final   Hgb A1c MFr Bld 04/09/2022 7.1 (H)  4.6 - 6.5 % Final   Glycemic Control Guidelines for People with Diabetes:Non Diabetic:  <6%Goal of Therapy: <7%Additional Action Suggested:  >8%      Allergies as of 04/13/2022   No Known Allergies      Medication List        Accurate as of April 13, 2022  8:54 AM. If you have any questions, ask your nurse or doctor.          albuterol 108 (90 Base) MCG/ACT inhaler Commonly  known as: VENTOLIN HFA Inhale 1-2 puffs into the lungs every 6 (six) hours as needed for wheezing or shortness of breath.   amLODipine 5 MG tablet Commonly known as: NORVASC Take 1 tablet (5 mg total) by mouth daily.   aspirin 81 MG tablet Take 81 mg by mouth daily.   clopidogrel 75 MG tablet Commonly known as: PLAVIX Take 1 tablet (75 mg total) by mouth daily with breakfast.   empagliflozin 10 MG Tabs tablet Commonly known as: JARDIANCE Take 10 mg by mouth daily.   famotidine 20 MG tablet Commonly known as: PEPCID Take 20 mg by mouth 2 (two) times daily as needed for heartburn or indigestion.   FreeStyle Freedom Lite w/Device Kit Use Freestyle Freedom Lite meter to check blood sugar twice daily. DX:E11.65   FREESTYLE LITE test strip Generic drug: glucose blood Use as instructed   hydrOXYzine 25 MG tablet Commonly known as: ATARAX Take 1 tablet (25 mg total) by mouth every 6 (six) hours as needed.   metFORMIN 1000 MG tablet Commonly known as: GLUCOPHAGE Take 0.5 tablets (500 mg total) by mouth 2 (two) times daily with a meal.   metoprolol succinate 100 MG 24 hr tablet Commonly known as: Toprol XL Take 1 tablet (100 mg total) by mouth daily.   multivitamin with minerals Tabs tablet Take 1 tablet by mouth daily.   Praluent 75 MG/ML Soaj Generic drug: Alirocumab Inject 1 mL into the skin every 14 (fourteen) days.   simvastatin 20 MG tablet Commonly known as: ZOCOR Take 20 mg by mouth daily.   Trulicity 3 NA/3.5TD Sopn Generic drug: Dulaglutide INJECT 3 MG UNDER THE SKIN ONCE A WEEK        Allergies: No Known Allergies  Past Medical History:  Diagnosis Date   CAD (coronary artery disease)    Two RCA stents remotely / 3rd RCA stent 2006   Colon polyps    s/p several Cscopes.   COPD (chronic obstructive pulmonary disease) (HCC)    on O2, nocturnal   Diabetes mellitus    dx aprox 2009   ED (erectile dysfunction)    has a vacumm device   Ejection  fraction    Hyperlipidemia    dx in 90s   Hypertension    dx in the 29   Hypogonadism male    PVD (peripheral vascular disease) ([place])    s/p stents at LE 2009, Dr Einar Gip   Shortness  of breath    O2 Sat dropped to 82% walking on the treadmill, September, 2012    Past Surgical History:  Procedure Laterality Date   APPENDECTOMY     CORONARY STENT INTERVENTION N/A 01/12/2022   Procedure: CORONARY STENT INTERVENTION;  Surgeon: Martinique, Peter M, MD;  Location: [facility];  Service: Cardiovascular;  Laterality: N/A;   RIGHT/LEFT HEART CATH AND CORONARY ANGIOGRAPHY N/A 01/11/2022   Procedure: RIGHT/LEFT HEART CATH AND CORONARY ANGIOGRAPHY;  Surgeon: Belva Crome, MD;  Location: [facility];  Service: Cardiovascular;  Laterality: N/A;   TONSILLECTOMY      Family History  Problem Relation Age of Onset   Heart disease Father    Hypertension Father    Stroke Father    Diabetes Paternal Aunt    Diabetes Maternal Grandmother    Diabetes Other        GM, nephews, many family members   Hyperlipidemia Other        ?   Prostate cancer Brother    Colon cancer Neg Hx     Social History:  reports that he quit smoking about 44 years ago. His smoking use included cigarettes. He has a 60.00 pack-year smoking history. He has never used smokeless tobacco. He reports current alcohol use. He reports that he does not use drugs.    Review of Systems        Lipids: He has been on treatment with simvastatin 20 mg from his PCP below with good control as follows       Lab Results  Component Value Date   CHOL 117 01/12/2022   CHOL 127 06/27/2021   CHOL 141 02/14/2021   Lab Results  Component Value Date   HDL 36 (L) 01/12/2022   HDL 42.30 06/27/2021   HDL 38.30 (L) 02/14/2021   Lab Results  Component Value Date   LDLCALC 65 01/12/2022   LDLCALC 63 06/27/2021   LDLCALC 64 02/14/2021   Lab Results  Component Value Date   TRIG 81 01/12/2022   TRIG 107.0 06/27/2021   TRIG  195.0 (H) 02/14/2021   Lab Results  Component Value Date   CHOLHDL 3.3 01/12/2022   CHOLHDL 3 06/27/2021   CHOLHDL 4 02/14/2021   Lab Results  Component Value Date   LDLDIRECT 55.0 01/01/2018                    HYPOGONADISM He had decreased libido and erectile dysfunction previously and was found to have hypogonadism, probably in 2011. He did have a slightly low free testosterone level in 2012 on treatment Prolactin level normal  Previously on AndroGel With his hemoglobin going up to 19.1 in August 2019 he has been told not to take any testosterone He was given clomiphene as a trial but he did not try this  Testosterone level is persistently low without testosterone therapy  Also more recently does not think he has unusual fatigue   Lab Results  Component Value Date   TESTOSTERONE 256.52 (L) 12/28/2021   TESTOSTERONE 245.96 (L) 10/24/2021   TESTOSTERONE 215.23 (L) 05/24/2020    He has been followed by hematologist for erythrocytosis     Latest Ref Rng & Units 01/29/2022    6:32 PM 01/17/2022    3:57 PM 01/13/2022    2:48 AM  CBC  WBC 4.0 - 10.5 K/uL 7.4  7.4  10.0   Hemoglobin 13.0 - 17.0 g/dL 18.3  15.7  17.0   Hematocrit 39.0 -  52.0 % 56.0  47.4  49.4   Platelets 150 - 400 K/uL 229  256.0  219         HYPERTENSION: This is followed by PCP and is on 50 mg of losartan along with half Maxide, amlodipine 5 mg and metoprolol   He is checking at home also   BP Readings from Last 3 Encounters:  04/13/22 (!) 142/74  04/11/22 136/72  01/30/22 132/80   RENAL dysfunction: His creatinine is relatively stable   Lab Results  Component Value Date   CREATININE 1.29 04/09/2022   CREATININE 1.14 01/29/2022   CREATININE 1.38 01/17/2022   NEUROPATHY: He has had persistent sensory loss, also has tingling in his distal feet   LABS:    Physical Examination:  BP (!) 142/74   Pulse 76   Ht '6\' 2"'  (1.88 m)   Wt 230 lb (104.3 kg)   SpO2 91%   BMI 29.53 kg/m        ASSESSMENT/PLAN:   Diabetes type 2, with mild obesity, non-insulin-dependent  His A1c is stable at 7.1  Currently on 3 mg Trulicity, 03.7 mg mg Jardiance and Metformin 500 mg daily  However his fasting readings appear to be higher both in the lab and at home although his home test strips are expired Because of family issues he may not be consistent with diet but his weight is about the same  Since his FASTING readings appear to be mostly high and is only on 500 mg metformin he may benefit from increase metformin Also discussed safety of metformin With his current renal function he should be able to take 1500 mg metformin a day in divided doses Continue Trulicity without change but may consider 4.5 mg Since his FreeStyle meter could not be downloaded we will switch him to One Touch Verio, new sample meter given and showed him how to use this, will also send new test trips  HYPERTENSION: Blood pressure is reasonably controlled and he will continue to monitor at home also  RENAL dysfunction: Variable and this is better more recently  Hypogonadism: Because of difficulties with erythrocytosis will not treat although it may be a candidate for [place] if he complains of significant fatigue   There are no Patient Instructions on file for this visit.    Elayne Snare 04/13/2022, 8:54 AM   Note: This office note was prepared with Dragon voice recognition system technology. Any transcriptional errors that result from this process are unintentional.

## 2022-04-13 NOTE — Patient Instructions (Addendum)
Metformin 1/2 in am and 1 at dinner  Check blood sugars on waking up 3-4  days a week  Also check blood sugars about 2 hours after meals and do this after different meals by rotation  Recommended blood sugar levels on waking up are 90-130 and about 2 hours after meal is 130-160  Please bring your blood sugar monitor to each visit, thank you

## 2022-04-16 ENCOUNTER — Other Ambulatory Visit: Payer: Self-pay

## 2022-04-16 MED ORDER — METFORMIN HCL 1000 MG PO TABS: ORAL_TABLET | ORAL | 3 refills | Status: DC

## 2022-04-17 ENCOUNTER — Encounter: Payer: Self-pay | Admitting: Internal Medicine

## 2022-04-17 ENCOUNTER — Ambulatory Visit (INDEPENDENT_AMBULATORY_CARE_PROVIDER_SITE_OTHER): Payer: Medicare Other | Admitting: Internal Medicine

## 2022-04-17 VITALS — BP 122/68 | HR 85 | Temp 98.0°F | Resp 18 | Ht 74.0 in | Wt 228.2 lb

## 2022-04-17 DIAGNOSIS — K529 Noninfective gastroenteritis and colitis, unspecified: Secondary | ICD-10-CM | POA: Diagnosis not present

## 2022-04-17 DIAGNOSIS — I2510 Atherosclerotic heart disease of native coronary artery without angina pectoris: Secondary | ICD-10-CM | POA: Diagnosis not present

## 2022-04-17 DIAGNOSIS — M7022 Olecranon bursitis, left elbow: Secondary | ICD-10-CM | POA: Diagnosis not present

## 2022-04-17 DIAGNOSIS — R112 Nausea with vomiting, unspecified: Secondary | ICD-10-CM

## 2022-04-17 MED ORDER — ONDANSETRON HCL 4 MG PO TABS: 4.0000 mg | ORAL_TABLET | Freq: Three times a day (TID) | ORAL | 0 refills | Status: DC | PRN

## 2022-04-17 NOTE — Progress Notes (Signed)
Subjective:    Patient ID: Jonathan [name]., male    DOB: [birth year], 86 y.o.   MRN: [id number]  DOS:  04/17/2022 Type of visit - description: Acute  Was seen few days ago with bursitis but he is here because of a different issue.  Yesterday started with nausea and vomited several times.  The vomiting was not necessarily after eating. No hematemesis . He has been able to drink plenty of fluids.  When asked, admits to diarrhea x1 time, loose stools, nonbloody.  Reports that he is urine output seems normal and the color is normal. No dizziness when he stands up. No fever chills No dysuria or gross hematuria Some abdominal cramping mostly on the lower abdomen.  As far as bursitis, the area seems smaller but is slightly warm to touch.  Review of Systems See above   Past Medical History:  Diagnosis Date   CAD (coronary artery disease)    Two RCA stents remotely / 3rd RCA stent 2006   Colon polyps    s/p several Cscopes.   COPD (chronic obstructive pulmonary disease) (HCC)    on O2, nocturnal   Diabetes mellitus    dx aprox 2009   ED (erectile dysfunction)    has a vacumm device   Ejection fraction    Hyperlipidemia    dx in 90s   Hypertension    dx in the 42   Hypogonadism male    PVD (peripheral vascular disease) ([place])    s/p stents at LE 2009, Dr Einar Gip   Shortness of breath    O2 Sat dropped to 82% walking on the treadmill, September, 2012    Past Surgical History:  Procedure Laterality Date   APPENDECTOMY     CORONARY STENT INTERVENTION N/A 01/12/2022   Procedure: CORONARY STENT INTERVENTION;  Surgeon: Martinique, Peter M, MD;  Location: [facility];  Service: Cardiovascular;  Laterality: N/A;   RIGHT/LEFT HEART CATH AND CORONARY ANGIOGRAPHY N/A 01/11/2022   Procedure: RIGHT/LEFT HEART CATH AND CORONARY ANGIOGRAPHY;  Surgeon: Belva Crome, MD;  Location: [facility];  Service: Cardiovascular;  Laterality: N/A;   TONSILLECTOMY      Current  Outpatient Medications  Medication Instructions   albuterol (VENTOLIN HFA) 108 (90 Base) MCG/ACT inhaler 1-2 puffs, Inhalation, Every 6 hours PRN   Alirocumab (PRALUENT) 75 MG/ML SOAJ 1 mL, Subcutaneous, Every 14 days   amLODipine (NORVASC) 5 mg, Oral, Daily   aspirin 81 mg, Oral, Daily,     Blood Glucose Monitoring Suppl (FREESTYLE FREEDOM LITE) w/Device KIT Use Freestyle Freedom Lite meter to check blood sugar twice daily. DX:E11.65   clopidogrel (PLAVIX) 75 mg, Oral, Daily with breakfast   Dulaglutide (TRULICITY) 3 UO/3.7GB SOPN INJECT 3 MG UNDER THE SKIN ONCE A WEEK   empagliflozin (JARDIANCE) 10 mg, Oral, Daily   famotidine (PEPCID) 20 mg, Oral, 2 times daily PRN   hydrOXYzine (ATARAX) 25 mg, Oral, Every 6 hours PRN   metFORMIN (GLUCOPHAGE) 1000 MG tablet Take 0.5 tablet in am and 1 whole one at dinner.   metoprolol succinate (TOPROL XL) 100 mg, Oral, Daily   Multiple Vitamin (MULTIVITAMIN WITH MINERALS) TABS tablet 1 tablet, Oral, Daily   ONETOUCH VERIO test strip Use as instructed to check blood sugar by rotation once a day   simvastatin (ZOCOR) 20 mg, Oral, Daily       Objective:   Physical Exam BP 122/68   Pulse 85   Temp 98 F (36.7 C) (Axillary)  Resp 18   Ht _0  (1.88 m)   Wt 228 lb 4 oz (103.5 kg)   SpO2 93%   BMI 29.31 kg/m  General:   Well developed, NAD, BMI noted.  HEENT:  Normocephalic . Face symmetric, atraumatic. Oral membranes moist. Conjunctivas: No pale or jaundice Lungs:  CTA B Normal respiratory effort, no intercostal retractions, no accessory muscle use. Heart: RRR,  no murmur. Upper extremity, L bursitis: Seems smaller compared to 5 days ago, slightly warm but not red. Abdomen:  Not distended, soft, non-tender. No rebound or rigidity.   Skin: Not pale. Not jaundice Lower extremities: no pretibial edema bilaterally  Neurologic:  alert & oriented X3.  Speech normal, gait appropriate for age and unassisted Psych--  Cognition and judgment  appear intact.  Cooperative with normal attention span and concentration.  Behavior appropriate. No anxious or depressed appearing.     Assessment     Assessment DM dx ~0388, complicated by CAD, PVD, mild DM neuropathy. Sees Dr. Dwyane Dee Hypogonadism. Sees Dr. Dwyane Dee HTN Hyperlipidemia COPD, nocturnal O2 prn Back pain, chronic: See visit 10/12/2016 CV: -CAD, Dr. Ron Parker Dr Meda Coffee S/p  stenting 2 to the RCA remote past and the third RCA stent in  2006, negative stress test in 2012, LVEF 55% on echo 2015 -Peripheral vascular disease, stents lower extremity 2009  ED Polycythemia: Saw hematology 11-2018, felt to be due to chronic respiratory failure.  Not likely polycythemia vera  PLAN    Gastroenteritis: 24 hours history of nausea - vomiting and one episode of diarrhea.  Nontoxic-appearing, membranes are moist, not getting dizzy.  Not septic appearing.  Vital signs: Not orthostatic. Plan: Push fluids as he is doing, Zofran as needed, call or ER if severe symptoms, see AVS Olecranon bursitis: It is smaller, slightly warm but he is not running fevers.  Areas around the elbow do not look like cellulitis.  Offered sports medicine referral, declined it, we agreed on observation.  Call if not gradually better

## 2022-04-17 NOTE — Assessment & Plan Note (Signed)
Gastroenteritis: 24 hours history of nausea - vomiting and one episode of diarrhea.  Nontoxic-appearing, membranes are moist, not getting dizzy.  Not septic appearing.  Vital signs: Not orthostatic. Plan: Push fluids as he is doing, Zofran as needed, call or ER if severe symptoms, see AVS Olecranon bursitis: It is smaller, slightly warm but he is not running fevers.  Areas around the elbow do not look like cellulitis.  Offered sports medicine referral, declined it, we agreed on observation.  Call if not gradually better

## 2022-04-17 NOTE — Patient Instructions (Signed)
Rest  Drink plenty of fluids: Water, Pedialyte, Gatorade.  Take Zofran as needed for nausea  Pepto-Bismol if needed for diarrhea  If you are not better in few days or if your symptoms get worse: Severe nausea, severe diarrhea, abdominal pain, blood in the stools, blood when you vomit or very weak): Seek medical attention.  We are referring you to sports medicine for your bursitis

## 2022-04-23 ENCOUNTER — Other Ambulatory Visit (HOSPITAL_COMMUNITY): Payer: Self-pay

## 2022-05-08 ENCOUNTER — Telehealth: Payer: Self-pay | Admitting: Endocrinology

## 2022-05-08 NOTE — Telephone Encounter (Signed)
MEDICATION: One Touch Verio test strips  PHARMACY:  [facility]  HAS THE PATIENT CONTACTED THEIR PHARMACY?  yes  IS THIS A 90 DAY SUPPLY : unknown  IS PATIENT OUT OF MEDICATION: yes  IF NOT; HOW MUCH IS LEFT:   LAST APPOINTMENT DATE: 04/13/22  NEXT APPOINTMENT DATE:'@10'$ /27/2023  DO WE HAVE YOUR PERMISSION TO LEAVE A DETAILED MESSAGE?:  OTHER COMMENTS:    **Let patient know to contact pharmacy at the end of the day to make sure medication is ready. **  ** Please notify patient to allow 48-72 hours to process**  **Encourage patient to contact the pharmacy for refills or they can request refills through [facility]**

## 2022-05-09 ENCOUNTER — Other Ambulatory Visit (HOSPITAL_COMMUNITY): Payer: Self-pay

## 2022-05-11 ENCOUNTER — Other Ambulatory Visit (HOSPITAL_COMMUNITY): Payer: Self-pay

## 2022-05-11 ENCOUNTER — Telehealth: Payer: Self-pay

## 2022-05-11 NOTE — Telephone Encounter (Signed)
Patient Advocate Encounter   Received notification that prior authorization is required for One Touch Verio Test Strips  Submitted: [id number] Key 05/11/2022  Clista Bernhardt, CPhT Rx Patient Advocate Phone: [phone]

## 2022-05-14 NOTE — Telephone Encounter (Signed)
Patient Advocate Encounter  Received a fax from [employer] regarding Prior Authorization for Verio products.   Authorization has been DENIED due to critera not met. This plan only covers Verio products for patients that are visually impared, using an insulin pump that communicates with Verio products, or has a documented disability requiring use of Verio specifically.  Denial letter added to patient chart.  Clista Bernhardt, CPhT Rx Patient Advocate Phone: [phone]

## 2022-05-16 ENCOUNTER — Other Ambulatory Visit: Payer: Self-pay

## 2022-05-16 DIAGNOSIS — E1165 Type 2 diabetes mellitus with hyperglycemia: Secondary | ICD-10-CM

## 2022-05-16 MED ORDER — ONETOUCH VERIO VI STRP: ORAL_STRIP | 12 refills | Status: DC

## 2022-05-25 ENCOUNTER — Encounter: Payer: Self-pay | Admitting: Cardiovascular Disease

## 2022-05-25 ENCOUNTER — Ambulatory Visit: Payer: Medicare Other | Attending: Cardiovascular Disease | Admitting: Cardiovascular Disease

## 2022-05-25 DIAGNOSIS — I10 Essential (primary) hypertension: Secondary | ICD-10-CM | POA: Insufficient documentation

## 2022-05-25 DIAGNOSIS — I739 Peripheral vascular disease, unspecified: Secondary | ICD-10-CM | POA: Insufficient documentation

## 2022-05-25 DIAGNOSIS — I2510 Atherosclerotic heart disease of native coronary artery without angina pectoris: Secondary | ICD-10-CM | POA: Insufficient documentation

## 2022-05-25 DIAGNOSIS — E782 Mixed hyperlipidemia: Secondary | ICD-10-CM | POA: Diagnosis not present

## 2022-05-25 DIAGNOSIS — Z23 Encounter for immunization: Secondary | ICD-10-CM | POA: Diagnosis not present

## 2022-05-25 NOTE — Assessment & Plan Note (Signed)
History of CAD status post cardiac catheterization by Dr. Tamala Julian performed/20/23 that showed three-vessel disease.  He underwent PCI and drug-eluting stenting by Dr. Martinique of his mid LAD and proximal RCA with excellent result.  His presenting symptoms were dyspnea but no chest pain.  He had normal LV function.  He remains on dual antiplatelet therapy and is asymptomatic.

## 2022-05-25 NOTE — Progress Notes (Signed)
05/25/2022 Jonathan [name].   05/24/1936  [id number]  Primary Physician Colon Branch, MD Primary Cardiologist: Lorretta Harp MD Garret Reddish, [place], [place]  HPI:  Jonathan [name]. is a 86 y.o.   mildly overweight married African-American male father of six total children, grandfather 45 grandchildren who is retired Biomedical scientist.  He is referred by Dr. Meda Coffee, his cardiologist for evaluation of lower extremity cramping.  I last saw him in the office 08/02/2020.  He takes care of his wife/dementia.  He has had coronary artery disease status post remote RCA stenting as recently as 2006.  He smoked remotely as well.  He history of diabetes, hypertension hyperlipidemia.  Never had a heart tach or stroke.  He does have COPD and complains of shortness of breath but denies chest pain.  He has had bilateral lower extremity stenting by Dr. Einar Gip back in 2009.  He complains of cramping at night consistent with restless leg syndrome but denies claudication.  Recent lower extremity arterial Doppler studies performed in our office 06/28/2020 revealed a right ABI of 1.03 and left and a left of 0.90 with a mildly elevated high-frequency signal in his left SFA stent.  Since I saw him in the office approximately 2 years ago he did have a heart cath by Dr. Tamala Julian performed 01/11/22 revealing three-vessel disease.  2 days later he had stenting of mid LAD and proximal RCA by Dr. Martinique with an excellent result.  He does have residual nondominant circumflex disease and normal LV function.  His presenting symptoms were dyspnea but no chest pain.  Enzymes were negative.  He does have Zylen Wenig peripheral neuropathy and really denies lifestyle-limiting claudication.   Current Meds  Medication Sig   albuterol (VENTOLIN HFA) 108 (90 Base) MCG/ACT inhaler Inhale 1-2 puffs into the lungs every 6 (six) hours as needed for wheezing or shortness of breath.   amLODipine (NORVASC) 5 MG tablet Take 1 tablet (5 mg total) by  mouth daily.   aspirin 81 MG tablet Take 81 mg by mouth daily.   Blood Glucose Monitoring Suppl (FREESTYLE FREEDOM LITE) w/Device KIT Use Freestyle Freedom Lite meter to check blood sugar twice daily. DX:E11.65   clopidogrel (PLAVIX) 75 MG tablet Take 1 tablet (75 mg total) by mouth daily with breakfast.   Dulaglutide (TRULICITY) 3 YM/4.1RA SOPN INJECT 3 MG UNDER THE SKIN ONCE A WEEK   empagliflozin (JARDIANCE) 10 MG TABS tablet Take 10 mg by mouth daily.   famotidine (PEPCID) 20 MG tablet Take 20 mg by mouth 2 (two) times daily as needed for heartburn or indigestion.   metFORMIN (GLUCOPHAGE) 1000 MG tablet Take 0.5 tablet in am and 1 whole one at dinner.   metoprolol succinate (TOPROL XL) 100 MG 24 hr tablet Take 1 tablet (100 mg total) by mouth daily.   Multiple Vitamin (MULTIVITAMIN WITH MINERALS) TABS tablet Take 1 tablet by mouth daily.   ondansetron (ZOFRAN) 4 MG tablet Take 1 tablet (4 mg total) by mouth every 8 (eight) hours as needed for nausea or vomiting.   ONETOUCH VERIO test strip Use as instructed to check blood sugar by rotation once a day   simvastatin (ZOCOR) 20 MG tablet Take 20 mg by mouth daily.     No Known Allergies  Social History   Socioeconomic History   Marital status: Married    Spouse name: Cerrella    Number of children: 6   Years of education: Not on file  Highest education level: Not on file  Occupational History   Occupation: retired, 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   Tobacco Use   Smoking status: Former    Packs/day: 2.00    Years: 30.00    Total pack years: 60.00    Types: Cigarettes    Quit date: 06/17/1977    Years since quitting: 44.9   Smokeless tobacco: Never   Tobacco comments:    2 ppd, quit 1978  Vaping Use   Vaping Use: Never used  Substance and Sexual Activity   Alcohol use: Yes    Alcohol/week: 0.0 standard drinks of alcohol    Comment: socially    Drug use: No   Sexual activity: Yes  Other Topics Concern   Not on  file  Social History Narrative   4 children (lost 1 son)   Wife has 2 children                Social Determinants of Health   Financial Resource Strain: Low Risk  (07/20/2021)   Overall Financial Resource Strain (CARDIA)    Difficulty of Paying Living Expenses: Not hard at all  Food Insecurity: No Food Insecurity (07/20/2021)   Hunger Vital Sign    Worried About Running Out of Food in the Last Year: Never true    Ran Out of Food in the Last Year: Never true  Transportation Needs: No Transportation Needs (07/20/2021)   PRAPARE - Hydrologist (Medical): No    Lack of Transportation (Non-Medical): No  Physical Activity: Inactive (07/20/2021)   Exercise Vital Sign    Days of Exercise per Week: 0 days    Minutes of Exercise per Session: 0 min  Stress: No Stress Concern Present (07/20/2021)   [place]    Feeling of Stress : Not at all  Social Connections: [place] (07/20/2021)   Social Connection and Isolation Panel [NHANES]    Frequency of Communication with Friends and Family: More than three times a week    Frequency of Social Gatherings with Friends and Family: More than three times a week    Attends Religious Services: More than 4 times per year    Active Member of [employer] or Organizations: Yes    Attends Music therapist: More than 4 times per year    Marital Status: Married  Human resources officer Violence: Not At Risk (07/20/2021)   Humiliation, Afraid, Rape, and Kick questionnaire    Fear of Current or Ex-Partner: No    Emotionally Abused: No    Physically Abused: No    Sexually Abused: No     Review of Systems: General: negative for chills, fever, night sweats or weight changes.  Cardiovascular: negative for chest pain, dyspnea on exertion, edema, orthopnea, palpitations, paroxysmal nocturnal dyspnea or shortness of breath Dermatological: negative for  rash Respiratory: negative for cough or wheezing Urologic: negative for hematuria Abdominal: negative for nausea, vomiting, diarrhea, bright red blood per rectum, melena, or hematemesis Neurologic: negative for visual changes, syncope, or dizziness All other systems reviewed and are otherwise negative except as noted above.    Blood pressure (!) 140/72, pulse 75, height 6' 2" (1.88 m), weight 225 lb 9.6 oz (102.3 kg), SpO2 93 %.  General appearance: alert and no distress Neck: no adenopathy, no carotid bruit, no JVD, supple, symmetrical, trachea midline, and thyroid not enlarged, symmetric, no tenderness/mass/nodules Lungs: clear to auscultation bilaterally Heart: regular  rate and rhythm, S1, S2 normal, no murmur, click, rub or gallop Extremities: extremities normal, atraumatic, no cyanosis or edema Pulses: 2+ and symmetric Skin: Skin color, texture, turgor normal. No rashes or lesions Neurologic: Grossly normal  EKG not performed today  ASSESSMENT AND PLAN:   Hyperlipidemia History of hyperlipidemia on statin therapy with lipid profile performed/28/23 revealing total cholesterol 117, LDL of 65 and HDL 36.  Essential hypertension History of essential hypertension a blood pressure measured today at 140/72.  He is on amlodipine and metoprolol.  CAD (coronary artery disease) History of CAD status post cardiac catheterization by Dr. Tamala Julian performed/20/23 that showed three-vessel disease.  He underwent PCI and drug-eluting stenting by Dr. Martinique of his mid LAD and proximal RCA with excellent result.  His presenting symptoms were dyspnea but no chest pain.  He had normal LV function.  He remains on dual antiplatelet therapy and is asymptomatic.  PVD (peripheral vascular disease) ([place]) History of PAD status post bilateral lower extremity stenting by Dr. Einar Gip back in 2009.  He does have peripheral neuropathy and really denies significant claudication.  His most recent Doppler studies  performed 08/01/2021 revealed a right ABI of 1.1 and a left of 0.94.  He did have moderate in-stent restenosis within the left SFA stent.     Lorretta Harp MD FACP,FACC,FAHA, [facility] 05/25/2022 8:22 AM

## 2022-05-25 NOTE — Assessment & Plan Note (Signed)
History of hyperlipidemia on statin therapy with lipid profile performed/28/23 revealing total cholesterol 117, LDL of 65 and HDL 36.

## 2022-05-25 NOTE — Patient Instructions (Signed)
Medication Instructions:  Your physician recommends that you continue on your current medications as directed. Please refer to the Current Medication list given to you today.  *If you need a refill on your cardiac medications before your next appointment, please call your pharmacy*   Follow-Up: At [facility], you and your health needs are our priority.  As part of our continuing mission to provide you with exceptional heart care, we have created designated Provider Care Teams.  These Care Teams include your primary Cardiologist (physician) and Advanced Practice Providers (APPs -  Physician Assistants and Nurse Practitioners) who all work together to provide you with the care you need, when you need it.  We recommend signing up for the patient portal called "MyChart".  Sign up information is provided on this After Visit Summary.  MyChart is used to connect with patients for Virtual Visits (Telemedicine).  Patients are able to view lab/test results, encounter notes, upcoming appointments, etc.  Non-urgent messages can be sent to your provider as well.   To learn more about what you can do with MyChart, go to [email or web address].    Your next appointment:   12 month(s)  The format for your next appointment:   In Person  Provider:   Jonathan Berry, MD   

## 2022-05-25 NOTE — Assessment & Plan Note (Signed)
History of essential hypertension a blood pressure measured today at 140/72.  He is on amlodipine and metoprolol.

## 2022-05-25 NOTE — Assessment & Plan Note (Signed)
History of PAD status post bilateral lower extremity stenting by Dr. Einar Gip back in 2009.  He does have peripheral neuropathy and really denies significant claudication.  His most recent Doppler studies performed 08/01/2021 revealed a right ABI of 1.1 and a left of 0.94.  He did have moderate in-stent restenosis within the left SFA stent.

## 2022-05-28 ENCOUNTER — Ambulatory Visit: Payer: Medicare Other | Admitting: Internal Medicine

## 2022-06-04 ENCOUNTER — Telehealth: Payer: Self-pay

## 2022-06-04 ENCOUNTER — Encounter: Payer: Self-pay | Admitting: Internal Medicine

## 2022-06-04 ENCOUNTER — Ambulatory Visit (INDEPENDENT_AMBULATORY_CARE_PROVIDER_SITE_OTHER): Payer: Medicare Other | Admitting: Internal Medicine

## 2022-06-04 VITALS — BP 136/82 | HR 87 | Temp 97.8°F | Resp 18 | Ht 74.0 in | Wt 227.5 lb

## 2022-06-04 DIAGNOSIS — E782 Mixed hyperlipidemia: Secondary | ICD-10-CM

## 2022-06-04 DIAGNOSIS — J9611 Chronic respiratory failure with hypoxia: Secondary | ICD-10-CM

## 2022-06-04 DIAGNOSIS — I2510 Atherosclerotic heart disease of native coronary artery without angina pectoris: Secondary | ICD-10-CM | POA: Diagnosis not present

## 2022-06-04 DIAGNOSIS — I10 Essential (primary) hypertension: Secondary | ICD-10-CM

## 2022-06-04 DIAGNOSIS — K219 Gastro-esophageal reflux disease without esophagitis: Secondary | ICD-10-CM | POA: Diagnosis not present

## 2022-06-04 DIAGNOSIS — D751 Secondary polycythemia: Secondary | ICD-10-CM

## 2022-06-04 LAB — CBC WITH DIFFERENTIAL/PLATELET
Basophils Absolute: 0.1 10*3/uL (ref 0.0–0.1)
Basophils Relative: 0.8 % (ref 0.0–3.0)
Eosinophils Absolute: 0.2 10*3/uL (ref 0.0–0.7)
Eosinophils Relative: 2.3 % (ref 0.0–5.0)
HCT: 50.6 % (ref 39.0–52.0)
Hemoglobin: 17.1 g/dL — ABNORMAL HIGH (ref 13.0–17.0)
Lymphocytes Relative: 30.7 % (ref 12.0–46.0)
Lymphs Abs: 3 10*3/uL (ref 0.7–4.0)
MCHC: 33.9 g/dL (ref 30.0–36.0)
MCV: 97.6 fl (ref 78.0–100.0)
Monocytes Absolute: 0.8 10*3/uL (ref 0.1–1.0)
Monocytes Relative: 8.5 % (ref 3.0–12.0)
Neutro Abs: 5.7 10*3/uL (ref 1.4–7.7)
Neutrophils Relative %: 57.7 % (ref 43.0–77.0)
Platelets: 247 10*3/uL (ref 150.0–400.0)
RBC: 5.18 Mil/uL (ref 4.22–5.81)
RDW: 15.9 % — ABNORMAL HIGH (ref 11.5–15.5)
WBC: 9.8 10*3/uL (ref 4.0–10.5)

## 2022-06-04 LAB — COMPREHENSIVE METABOLIC PANEL
ALT: 13 U/L (ref 0–53)
AST: 13 U/L (ref 0–37)
Albumin: 4 g/dL (ref 3.5–5.2)
Alkaline Phosphatase: 70 U/L (ref 39–117)
BUN: 21 mg/dL (ref 6–23)
CO2: 21 mEq/L (ref 19–32)
Calcium: 9.6 mg/dL (ref 8.4–10.5)
Chloride: 108 mEq/L (ref 96–112)
Creatinine, Ser: 1.42 mg/dL (ref 0.40–1.50)
GFR: 45.45 mL/min — ABNORMAL LOW (ref >60.00)
Glucose, Bld: 107 mg/dL — ABNORMAL HIGH (ref 70–99)
Potassium: 4 mEq/L (ref 3.5–5.1)
Sodium: 141 mEq/L (ref 135–145)
Total Bilirubin: 0.6 mg/dL (ref 0.2–1.2)
Total Protein: 7.2 g/dL (ref 6.0–8.3)

## 2022-06-04 LAB — TSH: TSH: 2.64 u[IU]/mL (ref 0.35–5.50)

## 2022-06-04 MED ORDER — PANTOPRAZOLE SODIUM 40 MG PO TBEC: 40.0000 mg | DELAYED_RELEASE_TABLET | Freq: Every day | ORAL | 3 refills | Status: DC

## 2022-06-04 NOTE — Progress Notes (Signed)
Subjective:    Patient ID: Jonathan [name]., male    DOB: 1936-01-28, 86 y.o.   MRN: [id number]  DOS:  06/04/2022 Type of visit - description: Routine visit  Chronic medical problems were reviewed.  He reports feeling tired for the last 2 weeks, denies DOE or shortness of breath per se.  He is just fatigued and falls asleep easily. Denies snoring.  Anxiety is well controlled.  O2 sat at night drops to 84% if he does not use O2. During the daytime runs in the 90 to 94%.  Also reports nighttime heartburn, somewhat better with Pepto-Bismol. Sxs mostly nocturnal, denies exertional chest pain.  Able to go to his second floor at home several times a day without problems.   Review of Systems Denies nausea vomiting or diarrhea.  No blood in the stools.  Past Medical History:  Diagnosis Date   CAD (coronary artery disease)    Two RCA stents remotely / 3rd RCA stent 2006   Colon polyps    s/p several Cscopes.   COPD (chronic obstructive pulmonary disease) (HCC)    on O2, nocturnal   Diabetes mellitus    dx aprox 2009   ED (erectile dysfunction)    has a vacumm device   Ejection fraction    Hyperlipidemia    dx in 90s   Hypertension    dx in the 27   Hypogonadism male    PVD (peripheral vascular disease) ([place])    s/p stents at LE 2009, Dr Einar Gip   Shortness of breath    O2 Sat dropped to 82% walking on the treadmill, September, 2012    Past Surgical History:  Procedure Laterality Date   APPENDECTOMY     CORONARY STENT INTERVENTION N/A 01/12/2022   Procedure: CORONARY STENT INTERVENTION;  Surgeon: Martinique, Peter M, MD;  Location: [facility];  Service: Cardiovascular;  Laterality: N/A;   RIGHT/LEFT HEART CATH AND CORONARY ANGIOGRAPHY N/A 01/11/2022   Procedure: RIGHT/LEFT HEART CATH AND CORONARY ANGIOGRAPHY;  Surgeon: Belva Crome, MD;  Location: [facility];  Service: Cardiovascular;  Laterality: N/A;   TONSILLECTOMY      Current Outpatient Medications   Medication Instructions   albuterol (VENTOLIN HFA) 108 (90 Base) MCG/ACT inhaler 1-2 puffs, Inhalation, Every 6 hours PRN   Alirocumab (PRALUENT) 75 MG/ML SOAJ 1 mL, Subcutaneous, Every 14 days   amLODipine (NORVASC) 5 mg, Oral, Daily   aspirin 81 mg, Oral, Daily,     Blood Glucose Monitoring Suppl (FREESTYLE FREEDOM LITE) w/Device KIT Use Freestyle Freedom Lite meter to check blood sugar twice daily. DX:E11.65   clopidogrel (PLAVIX) 75 mg, Oral, Daily with breakfast   Dulaglutide (TRULICITY) 3 OP/9.2TW SOPN INJECT 3 MG UNDER THE SKIN ONCE A WEEK   empagliflozin (JARDIANCE) 10 mg, Oral, Daily   famotidine (PEPCID) 20 mg, Oral, 2 times daily PRN   hydrOXYzine (ATARAX) 25 mg, Oral, Every 6 hours PRN   metFORMIN (GLUCOPHAGE) 1000 MG tablet Take 0.5 tablet in am and 1 whole one at dinner.   metoprolol succinate (TOPROL XL) 100 mg, Oral, Daily   Multiple Vitamin (MULTIVITAMIN WITH MINERALS) TABS tablet 1 tablet, Oral, Daily   ondansetron (ZOFRAN) 4 mg, Oral, Every 8 hours PRN   ONETOUCH VERIO test strip Use as instructed to check blood sugar by rotation once a day   simvastatin (ZOCOR) 20 mg, Oral, Daily       Objective:   Physical Exam BP 136/82   Pulse 87  Temp 97.8 F (36.6 C) (Oral)   Resp 18   Ht _0  (1.88 m)   Wt 227 lb 8 oz (103.2 kg)   SpO2 90%   BMI 29.21 kg/m  General:   Well developed, NAD, BMI noted.  HEENT:  Normocephalic . Face symmetric, atraumatic Lungs:  CTA B Normal respiratory effort, no intercostal retractions, no accessory muscle use. Heart: RRR,  no murmur.  Abdomen:  Not distended, soft, non-tender. No rebound or rigidity.   Skin: Not pale. Not jaundice Lower extremities: no pretibial edema bilaterally  Neurologic:  alert & oriented X3.  Speech normal, gait appropriate for age and unassisted Psych--  Cognition and judgment appear intact.  Cooperative with normal attention span and concentration.  Behavior appropriate. No anxious or depressed  appearing.     Assessment     Assessment DM dx ~1314, complicated by CAD, PVD, mild DM neuropathy. Sees Dr. Dwyane Dee Hypogonadism. Sees Dr. Dwyane Dee HTN Hyperlipidemia COPD, nocturnal O2 prn Back pain, chronic: See visit 10/12/2016 CV: -CAD, Dr. Ron Parker Dr Meda Coffee S/p  stenting 2 to the RCA remote past and the third RCA stent in  2006, negative stress test in 2012, LVEF 55% on echo 2015 -Peripheral vascular disease, stents lower extremity 2009  ED Polycythemia: Saw hematology 11-2018, felt to be due to chronic respiratory failure.  Not likely polycythemia vera  PLAN    DM, hypogonadism: Per Endo HTN: Seems well controlled, rec to check at home, continue Metoprolol, amlodipine.  Check CMP and CBC Hyperlipidemia: Last LDL satisfactory, I see that he was prescribed Praluent, patient is not taking it. CAD, PVD: Saw cardiology 05/25/2022, noted to be stable on dual antiplatelets.  He reports nocturnal heartburn, no exertional symptoms  EKG today: No acute changes.  GERD: Currently on H2 blockers, reports nocturnal heartburn, no exertional symptoms, Rx Protonix. Fatigue: Probably multifactorial including hypogonadism, hypoxemia, and others. 3-minute walk test: Oxygen dropped from 92% to 89%, he qualifies for diurnal O2 based on 89% O2 sat and polycythemia. Order sent. Reassess fatigue once he have oxygen 24/7 on board Preventive care reviewed RTC 2 months.

## 2022-06-04 NOTE — Assessment & Plan Note (Signed)
DM, hypogonadism: Per Endo HTN: Seems well controlled, rec to check at home, continue Metoprolol, amlodipine.  Check CMP and CBC Hyperlipidemia: Last LDL satisfactory, I see that he was prescribed Praluent, patient is not taking it. CAD, PVD: Saw cardiology 05/25/2022, noted to be stable on dual antiplatelets.  He reports nocturnal heartburn, no exertional symptoms  EKG today: No acute changes.  GERD: Currently on H2 blockers, reports nocturnal heartburn, no exertional symptoms, Rx Protonix. Fatigue: Probably multifactorial including hypogonadism, hypoxemia, and others. 3-minute walk test: Oxygen dropped from 92% to 89%, he qualifies for diurnal O2 based on 89% O2 sat and polycythemia. Order sent. Reassess fatigue once he have oxygen 24/7 on board Preventive care reviewed RTC 2 months.

## 2022-06-04 NOTE — Telephone Encounter (Signed)
Received fax confirmation

## 2022-06-04 NOTE — Assessment & Plan Note (Signed)
-  Td 2016 -  PNM 23:  2015 and 2020; prevnar 2015 -  s/p zostavax per pt ;  s/p shingrix - covid booster rec -Had a flu shot -CCS: Colonoscopy-- h/o polyps in colon, s/p several Cscopes per pt,  Colonoscopy 12/06/2014, no polyps. Desires no further CCS  -Prostate cancer screening: Last PSA normal (2020); desires no further screening  - POA: see instructions

## 2022-06-04 NOTE — Patient Instructions (Addendum)
We are sending a order to have oxygen 24/7.  If you have severe fatigue, your heartburn gets worse, you have chest pain during the daytime: Seek medical attention or call.  Stop famotidine and Pepto-Bismol Start pantoprazole every morning before breakfast  Recommend a  COVID booster.   Check the  blood pressure regularly BP GOAL is between 110/65 and  135/85. If it is consistently higher or lower, let me know    GO TO THE LAB : Get the blood work     [place], Shannon back for checkup in 2 months      "Living will", "Waldo of attorney": Advanced care planning  (If you already have a living will or healthcare power of attorney, please bring the copy to be scanned in your chart.)  Advance care planning is a process that supports adults in  understanding and sharing their preferences regarding future medical care.   The patient's preferences are recorded in documents called Advance Directives.    Advanced directives are completed (and can be modified at any time) while the patient is in full mental capacity.   The documentation should be available at all times to the patient, the family and the healthcare providers.  Bring in a copy to be scanned in your chart is an excellent idea and is recommended   This legal documents direct treatment decision making and/or appoint a surrogate to make the decision if the patient is not capable to do so.    Advance directives can be documented in many types of formats,  documents have names such as:  Lliving will  Durable power of attorney for healthcare (healthcare proxy or healthcare power of attorney)  Combined directives  Physician orders for life-sustaining treatment    More information at:  [email or web address]

## 2022-06-04 NOTE — Telephone Encounter (Signed)
Pt now qualifies for 24/7 oxygen, orders faxed to [facility] at [phone].

## 2022-06-04 NOTE — Progress Notes (Signed)
SATURATION QUALIFICATIONS: (This note is used to comply with regulatory documentation for home oxygen)  Patient Saturations on Room Air at Rest = 92%  Patient Saturations on Room Air while Ambulating = 89%  Patient Saturations on  Liters of oxygen while Ambulating = %  Please briefly explain why patient needs home oxygen: Secondary dx of erythrocythemia

## 2022-06-07 NOTE — Telephone Encounter (Signed)
Pt called back- he has not heard from [facility]. Informed I'd check on this for him.   I have refaxed the orders to [facility]- will call them tomorrow to check status.

## 2022-06-07 NOTE — Telephone Encounter (Signed)
Called Pt- LMOM asking for call back -wanted to know if he received oxygen from [facility].

## 2022-06-08 NOTE — Telephone Encounter (Signed)
Tried calling [facility] [place]- no answer, chart reviewed- Pt actually has his oxygen DME through [facility]- order faxed to them at [phone].

## 2022-06-13 NOTE — Telephone Encounter (Signed)
Received fax confirmation

## 2022-06-13 NOTE — Telephone Encounter (Signed)
Tried calling [facility] office) [phone]- to confirm they received orders. I was informed Pt is handled through [employer] office. Tried calling [facility] office [phone], no answer. Will try again later.

## 2022-06-13 NOTE — Telephone Encounter (Signed)
Please advise 

## 2022-06-13 NOTE — Telephone Encounter (Signed)
[facility] called and stated pt complained the oxygen they have now is too heavy and would like something lighter. She stated the only way to do this would be to provide paperwork pt needs it to be lighter and to order the oxy go fit instead. She stated we can fax it to [phone].

## 2022-06-13 NOTE — Telephone Encounter (Signed)
Was able to speak w/ [facility] [place]- informed that they have been having computer issues over the last 7 days, she is unable to confirm if orders were received. She requested I re-fax orders to [phone].

## 2022-06-14 NOTE — Telephone Encounter (Signed)
Received fax confirmation

## 2022-06-14 NOTE — Telephone Encounter (Signed)
Letter faxed to [place] at number provided.

## 2022-06-14 NOTE — Telephone Encounter (Signed)
Okay to write a letter stating that due to patient's current health, he needs the lightest  possible equipment

## 2022-06-19 ENCOUNTER — Other Ambulatory Visit: Payer: Self-pay | Admitting: Internal Medicine

## 2022-06-22 DIAGNOSIS — Z23 Encounter for immunization: Secondary | ICD-10-CM | POA: Diagnosis not present

## 2022-07-13 ENCOUNTER — Inpatient Hospital Stay: Payer: Medicare Other | Attending: Hematology & Oncology

## 2022-07-13 ENCOUNTER — Other Ambulatory Visit: Payer: Self-pay | Admitting: Family

## 2022-07-13 ENCOUNTER — Other Ambulatory Visit (INDEPENDENT_AMBULATORY_CARE_PROVIDER_SITE_OTHER): Payer: Medicare Other

## 2022-07-13 ENCOUNTER — Inpatient Hospital Stay: Payer: Medicare Other | Admitting: Family

## 2022-07-13 DIAGNOSIS — E1165 Type 2 diabetes mellitus with hyperglycemia: Secondary | ICD-10-CM

## 2022-07-13 DIAGNOSIS — D751 Secondary polycythemia: Secondary | ICD-10-CM

## 2022-07-13 DIAGNOSIS — Z1589 Genetic susceptibility to other disease: Secondary | ICD-10-CM

## 2022-07-13 LAB — BASIC METABOLIC PANEL
BUN: 14 mg/dL (ref 6–23)
CO2: 24 mEq/L (ref 19–32)
Calcium: 9 mg/dL (ref 8.4–10.5)
Chloride: 108 mEq/L (ref 96–112)
Creatinine, Ser: 1.14 mg/dL (ref 0.40–1.50)
GFR: 59.11 mL/min — ABNORMAL LOW (ref >60.00)
Glucose, Bld: 134 mg/dL — ABNORMAL HIGH (ref 70–99)
Potassium: 3.4 mEq/L — ABNORMAL LOW (ref 3.5–5.1)
Sodium: 140 mEq/L (ref 135–145)

## 2022-07-13 LAB — HEMOGLOBIN A1C: Hgb A1c MFr Bld: 7 % — ABNORMAL HIGH (ref 4.6–6.5)

## 2022-07-18 ENCOUNTER — Ambulatory Visit (INDEPENDENT_AMBULATORY_CARE_PROVIDER_SITE_OTHER): Payer: Medicare Other | Admitting: Endocrinology

## 2022-07-18 VITALS — BP 124/72 | HR 78 | Ht 74.0 in | Wt 230.6 lb

## 2022-07-18 DIAGNOSIS — E876 Hypokalemia: Secondary | ICD-10-CM

## 2022-07-18 DIAGNOSIS — I2510 Atherosclerotic heart disease of native coronary artery without angina pectoris: Secondary | ICD-10-CM

## 2022-07-18 DIAGNOSIS — E1165 Type 2 diabetes mellitus with hyperglycemia: Secondary | ICD-10-CM

## 2022-07-18 DIAGNOSIS — I10 Essential (primary) hypertension: Secondary | ICD-10-CM

## 2022-07-18 MED ORDER — ONETOUCH VERIO VI STRP: ORAL_STRIP | 12 refills | Status: DC

## 2022-07-18 NOTE — Progress Notes (Signed)
=         Patient ID: Jonathan Kunert., male   DOB: [birth year], 86 y.o.   MRN: [id number]    Reason for Appointment:  Follow-up of various problems  History of Present Illness:          Diagnosis: Type 2 diabetes mellitus, date of diagnosis:  2009      Past history: He is not clear when his diabetes was diagnosed but according to hospital records it may have been in 2009. Most likely he was placed on metformin initially and at some point Amaryl added. In 2013 it was also given Januvia presumably to improve his control. However appears that his A1c has been consistently over 7% Janumet was started instead of metformin and Januvia separately in 2013  He was started on Trulicity in 0/10 instead of Victoza because excessive bruising on his abdomen  Later in 01/2015 he was switched to Bydureon because of insurance noncoverage of his Trulicity  Recent history:   Non-insulin hypoglycemic drugs are: Metformin 9323FT 1x a day, Trulicity 3.0  mg weekly, Jardiance 10 mg daily  His A1c is 7.0  Fructosamine last 399 compared to 288  Current blood sugar patterns and problems: He is still using his old FreeStyle meter that cannot be downloaded and did not start the Verio as his test trips are not covered by the local pharmacy  Recently had a mostly some random readings at different times with highest 165 but appears to have relatively higher readings FASTING Lab fasting was 134  Previously fasting reading was higher and he was told to increase metformin to 1500 mg a day but he misunderstood and is only taking 1000 mg in the morning He thinks that he does not want to lose weight As before is not doing much formal exercise but just busy with household activities and helping with his wife Diet is generally pretty good Has taken Trulicity every week regularly Weight is about the same      Side effects from medications have been: None  Glucose monitoring:  done usually once a day        Glucometer:  FreeStyle  Blood Glucose readings from monitor review   PRE-MEAL Fasting Lunch Dinner Bedtime Overall  Glucose range: 129-147      Mean/median:     132   POST-MEAL PC Breakfast PC Lunch PC Dinner  Glucose range:   165  Mean/median:         Meals: 3 meals per day. eating egg/meat bread for breakfast, dinner 6-7 PM     Dietician visit: Most recent: 4/16 .               Weight history:  Wt Readings from Last 3 Encounters:  07/18/22 230 lb 9.6 oz (104.6 kg)  06/04/22 227 lb 8 oz (103.2 kg)  05/25/22 225 lb 9.6 oz (102.3 kg)   Glycemic control:   Lab Results  Component Value Date   HGBA1C 7.0 (H) 07/13/2022   HGBA1C 7.1 (H) 04/09/2022   HGBA1C 7.2 (H) 10/24/2021   Lab Results  Component Value Date   MICROALBUR 6.4 (H) 04/09/2022   LDLCALC 65 01/12/2022   CREATININE 1.14 07/13/2022    Lab Results  Component Value Date   FRUCTOSAMINE 319 (H) 12/28/2021   FRUCTOSAMINE 288 (H) 05/24/2020   FRUCTOSAMINE 284 08/26/2019    OTHER active problems addressed today: See review of systems  Lab on 07/13/2022  Component Date Value Ref Range Status  Sodium 07/13/2022 140  135 - 145 mEq/L Final   Potassium 07/13/2022 3.4 (L)  3.5 - 5.1 mEq/L Final   Chloride 07/13/2022 108  96 - 112 mEq/L Final   CO2 07/13/2022 24  19 - 32 mEq/L Final   Glucose, Bld 07/13/2022 134 (H)  70 - 99 mg/dL Final   BUN 07/13/2022 14  6 - 23 mg/dL Final   Creatinine, Ser 07/13/2022 1.14  0.40 - 1.50 mg/dL Final   GFR 07/13/2022 59.11 (L)  >60.00 mL/min Final   Calculated using the CKD-EPI Creatinine Equation (2021)   Calcium 07/13/2022 9.0  8.4 - 10.5 mg/dL Final   Hgb A1c MFr Bld 07/13/2022 7.0 (H)  4.6 - 6.5 % Final   Glycemic Control Guidelines for People with Diabetes:Non Diabetic:  <6%Goal of Therapy: <7%Additional Action Suggested:  >8%      Allergies as of 07/18/2022   No Known Allergies      Medication List        Accurate as of July 18, 2022 10:16 AM. If you have  any questions, ask your nurse or doctor.          albuterol 108 (90 Base) MCG/ACT inhaler Commonly known as: VENTOLIN HFA Inhale 1-2 puffs into the lungs every 6 (six) hours as needed for wheezing or shortness of breath.   amLODipine 5 MG tablet Commonly known as: NORVASC Take 1 tablet (5 mg total) by mouth daily.   aspirin 81 MG tablet Take 81 mg by mouth daily.   clopidogrel 75 MG tablet Commonly known as: PLAVIX Take 1 tablet (75 mg total) by mouth daily with breakfast.   empagliflozin 10 MG Tabs tablet Commonly known as: JARDIANCE Take 10 mg by mouth daily.   famotidine 20 MG tablet Commonly known as: PEPCID Take 20 mg by mouth 2 (two) times daily as needed for heartburn or indigestion.   FreeStyle Freedom Lite w/Device Kit Use Freestyle Freedom Lite meter to check blood sugar twice daily. DX:E11.65   hydrOXYzine 25 MG tablet Commonly known as: ATARAX Take 1 tablet (25 mg total) by mouth every 6 (six) hours as needed.   metFORMIN 1000 MG tablet Commonly known as: GLUCOPHAGE Take 0.5 tablet in am and 1 whole one at dinner.   metoprolol succinate 100 MG 24 hr tablet Commonly known as: Toprol XL Take 1 tablet (100 mg total) by mouth daily.   multivitamin with minerals Tabs tablet Take 1 tablet by mouth daily.   ondansetron 4 MG tablet Commonly known as: ZOFRAN Take 1 tablet (4 mg total) by mouth every 8 (eight) hours as needed for nausea or vomiting.   OneTouch Verio test strip Generic drug: glucose blood Use as instructed to check blood sugar by rotation once a day   OXYGEN Inhale 2 L/min into the lungs continuous.   pantoprazole 40 MG tablet Commonly known as: PROTONIX Take 1 tablet (40 mg total) by mouth daily.   Praluent 75 MG/ML Soaj Generic drug: Alirocumab Inject 1 mL into the skin every 14 (fourteen) days.   simvastatin 20 MG tablet Commonly known as: ZOCOR Take 20 mg by mouth daily.   Trulicity 3 RS/8.5IO Sopn Generic drug:  Dulaglutide INJECT 3 MG UNDER THE SKIN ONCE A WEEK        Allergies: No Known Allergies  Past Medical History:  Diagnosis Date   CAD (coronary artery disease)    Two RCA stents remotely / 3rd RCA stent 2006   Colon polyps    s/p several  Cscopes.   COPD (chronic obstructive pulmonary disease) (HCC)    on O2, nocturnal   Diabetes mellitus    dx aprox 2009   ED (erectile dysfunction)    has a vacumm device   Ejection fraction    Hyperlipidemia    dx in 90s   Hypertension    dx in the 43   Hypogonadism male    PVD (peripheral vascular disease) ([place])    s/p stents at LE 2009, Dr Einar Gip   Shortness of breath    O2 Sat dropped to 82% walking on the treadmill, September, 2012    Past Surgical History:  Procedure Laterality Date   APPENDECTOMY     CORONARY STENT INTERVENTION N/A 01/12/2022   Procedure: CORONARY STENT INTERVENTION;  Surgeon: Martinique, Peter M, MD;  Location: [place] [facility];  Service: Cardiovascular;  Laterality: N/A;   RIGHT/LEFT HEART CATH AND CORONARY ANGIOGRAPHY N/A 01/11/2022   Procedure: RIGHT/LEFT HEART CATH AND CORONARY ANGIOGRAPHY;  Surgeon: Belva Crome, MD;  Location: [place] [facility];  Service: Cardiovascular;  Laterality: N/A;   TONSILLECTOMY      Family History  Problem Relation Age of Onset   Heart disease Father    Hypertension Father    Stroke Father    Diabetes Paternal Aunt    Diabetes Maternal Grandmother    Diabetes Other        GM, nephews, many family members   Hyperlipidemia Other        ?   Prostate cancer Brother    Colon cancer Neg Hx     Social History:  reports that he quit smoking about 45 years ago. His smoking use included cigarettes. He has a 60.00 pack-year smoking history. He has never used smokeless tobacco. He reports current alcohol use. He reports that he does not use drugs.    Review of Systems        Lipids: He has been on treatment with simvastatin 20 mg from his PCP below with good control as  follows       Lab Results  Component Value Date   CHOL 117 01/12/2022   CHOL 127 06/27/2021   CHOL 141 02/14/2021   Lab Results  Component Value Date   HDL 36 (L) 01/12/2022   HDL 42.30 06/27/2021   HDL 38.30 (L) 02/14/2021   Lab Results  Component Value Date   LDLCALC 65 01/12/2022   LDLCALC 63 06/27/2021   LDLCALC 64 02/14/2021   Lab Results  Component Value Date   TRIG 81 01/12/2022   TRIG 107.0 06/27/2021   TRIG 195.0 (H) 02/14/2021   Lab Results  Component Value Date   CHOLHDL 3.3 01/12/2022   CHOLHDL 3 06/27/2021   CHOLHDL 4 02/14/2021   Lab Results  Component Value Date   LDLDIRECT 55.0 01/01/2018                HYPOGONADISM He had decreased libido and erectile dysfunction previously and was found to have hypogonadism, probably in 2011. He did have a slightly low free testosterone level in 2012 on treatment Prolactin level normal  Previously on AndroGel With his hemoglobin going up to 19.1 in August 2019 he has been told not to take any testosterone He was given clomiphene as a trial but he did not try this  Testosterone level is persistently low without testosterone therapy  Also more recently does not think he has unusual fatigue   Lab Results  Component Value Date   TESTOSTERONE  256.52 (L) 12/28/2021   TESTOSTERONE 245.96 (L) 10/24/2021   TESTOSTERONE 215.23 (L) 05/24/2020    He has been followed by hematologist for erythrocytosis which is improved     Latest Ref Rng & Units 06/04/2022    9:39 AM 01/29/2022    6:32 PM 01/17/2022    3:57 PM  CBC  WBC 4.0 - 10.5 K/uL 9.8  7.4  7.4   Hemoglobin 13.0 - 17.0 g/dL 17.1  18.3  15.7   Hematocrit 39.0 - 52.0 % 50.6  56.0  47.4   Platelets 150.0 - 400.0 K/uL 247.0  229  256.0         HYPERTENSION: This is followed by PCP and is on 50 mg of losartan along with half Maxzide, amlodipine 5 mg and metoprolol   He is checking at home also  He now appears to be having mild hypokalemia  BP Readings  from Last 3 Encounters:  07/18/22 124/72  06/04/22 136/82  05/25/22 (!) 140/72   RENAL dysfunction: His creatinine is relatively better now   Lab Results  Component Value Date   CREATININE 1.14 07/13/2022   CREATININE 1.42 06/04/2022   CREATININE 1.29 04/09/2022   NEUROPATHY: He has had persistent sensory loss, also has tingling in his distal feet   LABS:    Physical Examination:  BP 124/72 (BP Location: Left Arm, Patient Position: Sitting, Cuff Size: Normal)   Pulse 78   Ht _0  (1.88 m)   Wt 230 lb 9.6 oz (104.6 kg)   SpO2 93%   BMI 29.61 kg/m       ASSESSMENT/PLAN:   Diabetes type 2, with mild obesity, non-insulin-dependent  His A1c is stable at 7.0  Currently on 3 mg Trulicity, 88.5 mg mg Jardiance and Metformin 1000 mg daily  With his age and comorbid conditions his glucose control is relatively good Tolerating all medications well However with his higher fasting readings he may benefit from taking the metformin in the evening rather than in the morning and potentially increasing the doses He feels that soon he may be able to start some regular exercise at home also His fasting readings are slightly better but still relatively high VERIO test strips will be sent to [facility] Continue Trulicity 3 mg, he is reluctant to make efforts to lose weight   HYPERTENSION: Blood pressure is controlled  Needs to have potassium supplemented by his PCP  RENAL dysfunction: this is better more recently  Hypogonadism: Not symptomatic currently  There are no Patient Instructions on file for this visit.    Elayne Snare 07/18/2022, 10:16 AM   Note: This office note was prepared with Dragon voice recognition system technology. Any transcriptional errors that result from this process are unintentional.

## 2022-07-18 NOTE — Patient Instructions (Addendum)
Check blood sugars on waking up 2-3 days a week  Also check blood sugars about 2 hours after meals and do this after different meals by rotation  Recommended blood sugar levels on waking up are 90-130 and about 2 hours after meal is 130-160  Please bring your blood sugar monitor to each visit, thank you  Metformin at dinner

## 2022-07-23 ENCOUNTER — Ambulatory Visit: Payer: Medicare Other

## 2022-07-24 ENCOUNTER — Ambulatory Visit (INDEPENDENT_AMBULATORY_CARE_PROVIDER_SITE_OTHER): Payer: Medicare Other

## 2022-07-24 DIAGNOSIS — I739 Peripheral vascular disease, unspecified: Secondary | ICD-10-CM | POA: Diagnosis not present

## 2022-07-31 ENCOUNTER — Encounter: Payer: Self-pay | Admitting: Internal Medicine

## 2022-07-31 ENCOUNTER — Ambulatory Visit (INDEPENDENT_AMBULATORY_CARE_PROVIDER_SITE_OTHER): Payer: Medicare Other | Admitting: Internal Medicine

## 2022-07-31 VITALS — BP 132/70 | HR 91 | Temp 98.0°F | Resp 18 | Ht 74.0 in | Wt 225.5 lb

## 2022-07-31 DIAGNOSIS — J9611 Chronic respiratory failure with hypoxia: Secondary | ICD-10-CM

## 2022-07-31 DIAGNOSIS — R197 Diarrhea, unspecified: Secondary | ICD-10-CM | POA: Diagnosis not present

## 2022-07-31 DIAGNOSIS — I10 Essential (primary) hypertension: Secondary | ICD-10-CM

## 2022-07-31 DIAGNOSIS — E1140 Type 2 diabetes mellitus with diabetic neuropathy, unspecified: Secondary | ICD-10-CM | POA: Diagnosis not present

## 2022-07-31 DIAGNOSIS — I2510 Atherosclerotic heart disease of native coronary artery without angina pectoris: Secondary | ICD-10-CM | POA: Diagnosis not present

## 2022-07-31 NOTE — Patient Instructions (Addendum)
For diarrhea: Drink plenty of fluids  Hold Jardiance for 2 days  Okay to take over-the-counter Imodium as needed for 2 to 3 days  If you feel dizzy, the diarrhea is severe, you have fever chills, blood in the stools: Reach out immediately to the office  If you are not gradually better in the next few days let us know    Reach out to cardiology [facility] [place], [place] back for   blood work in 1 week to 10 days  Come back for a checkup in 4 months

## 2022-07-31 NOTE — Assessment & Plan Note (Signed)
ROV but did report diarrhea Diarrhea: Started last night, several episodes, nontoxic-appearing, no orthostatic symptoms. Has been on metformin for ~ 10 days, if sxs cont, s/e from metformin?. Plan: Fluids, Imodium OTC as needed, hold Jardiance for couple of days.  Good hydration. DM Saw Endo 2 weeks ago, was recommended to add metformin.  Also on Trulicity, Jardiance. HTN: BP today is very good, continue amlodipine, metoprolol.  Check BMP in 1 week.   Cholesterol: on simva, cards added praluent, never started, rec to reach out to cards  GERD: At [place] protonix was added to Pecid, nocturnal GERD improved Fatigue: see next  COPD:   see LOV day time l O2 was added to the nocturnal O2,  the supply company never provided him w/ diurnal O2; currently feels he does not need it, fatigue is better  RTC for BMP in 1 week Routine checkup 4 months

## 2022-07-31 NOTE — Progress Notes (Signed)
Subjective:    Patient ID: Jonathan [name]., male    DOB: 1936-05-27, 86 y.o.   MRN: [id number]  DOS:  07/31/2022 Type of visit - description: f/u   Since the last office visit was feeling well until yesterday when he had some nausea and vomiting in the morning. Overnight, he had 5 or 6 episodes of watery diarrhea, no associated with fever chills or blood in the stools. No abdominal pain fever or chills. Has not eaten any unusual foods.  Other family members are not affected.  He feels very tired but denies feeling dizzy or orthostatic.  Review of Systems See above   Past Medical History:  Diagnosis Date   CAD (coronary artery disease)    Two RCA stents remotely / 3rd RCA stent 2006   Colon polyps    s/p several Cscopes.   COPD (chronic obstructive pulmonary disease) (HCC)    on O2, nocturnal   Diabetes mellitus    dx aprox 2009   ED (erectile dysfunction)    has a vacumm device   Ejection fraction    Hyperlipidemia    dx in 90s   Hypertension    dx in the 56   Hypogonadism male    PVD (peripheral vascular disease) ([place])    s/p stents at LE 2009, Dr Einar Gip   Shortness of breath    O2 Sat dropped to 82% walking on the treadmill, September, 2012    Past Surgical History:  Procedure Laterality Date   APPENDECTOMY     CORONARY STENT INTERVENTION N/A 01/12/2022   Procedure: CORONARY STENT INTERVENTION;  Surgeon: Martinique, Peter M, MD;  Location: [place] [facility];  Service: Cardiovascular;  Laterality: N/A;   RIGHT/LEFT HEART CATH AND CORONARY ANGIOGRAPHY N/A 01/11/2022   Procedure: RIGHT/LEFT HEART CATH AND CORONARY ANGIOGRAPHY;  Surgeon: Belva Crome, MD;  Location: [place] [facility];  Service: Cardiovascular;  Laterality: N/A;   TONSILLECTOMY      Current Outpatient Medications  Medication Instructions   albuterol (VENTOLIN HFA) 108 (90 Base) MCG/ACT inhaler 1-2 puffs, Inhalation, Every 6 hours PRN   Alirocumab (PRALUENT) 75 MG/ML SOAJ 1 mL, Subcutaneous, Every 14  days   amLODipine (NORVASC) 5 mg, Oral, Daily   aspirin 81 mg, Oral, Daily,     Blood Glucose Monitoring Suppl (FREESTYLE FREEDOM LITE) w/Device KIT Use Freestyle Freedom Lite meter to check blood sugar twice daily. DX:E11.65   clopidogrel (PLAVIX) 75 mg, Oral, Daily with breakfast   Dulaglutide (TRULICITY) 3 RZ/7.3VA SOPN INJECT 3 MG UNDER THE SKIN ONCE A WEEK   empagliflozin (JARDIANCE) 10 mg, Oral, Daily   famotidine (PEPCID) 20 mg, Oral, 2 times daily PRN   hydrOXYzine (ATARAX) 25 mg, Oral, Every 6 hours PRN   metFORMIN (GLUCOPHAGE) 1000 MG tablet Take 0.5 tablet in am and 1 whole one at dinner.   metoprolol succinate (TOPROL XL) 100 mg, Oral, Daily   Multiple Vitamin (MULTIVITAMIN WITH MINERALS) TABS tablet 1 tablet, Oral, Daily   ondansetron (ZOFRAN) 4 mg, Oral, Every 8 hours PRN   ONETOUCH VERIO test strip Use as instructed to check blood sugar by rotation once a day   OXYGEN 2 L/min, Inhalation, Continuous   pantoprazole (PROTONIX) 40 mg, Oral, Daily   simvastatin (ZOCOR) 20 mg, Oral, Daily       Objective:   Physical Exam BP 132/70   Pulse 91   Temp 98 F (36.7 C) (Oral)   Resp 18   Ht _0  (1.88  m)   Wt 225 lb 8 oz (102.3 kg)   SpO2 96%   BMI 28.95 kg/m  General:   Well developed, NAD, BMI noted.  HEENT:  Normocephalic . Face symmetric, atraumatic Lungs:  CTA B Normal respiratory effort, no intercostal retractions, no accessory muscle use. Heart: RRR,  no murmur.  Abdomen:  Not distended, soft, non-tender. No rebound or rigidity.   Skin: Not pale. Not jaundice Lower extremities: no pretibial edema bilaterally  Neurologic:  alert & oriented X3.  Speech normal, gait appropriate for age and unassisted Psych--  Cognition and judgment appear intact.  Cooperative with normal attention span and concentration.  Behavior appropriate. No anxious or depressed appearing.     Assessment    Assessment DM dx ~2197, complicated by CAD, PVD, mild DM neuropathy. Sees  Dr. Dwyane Dee Hypogonadism. Sees Dr. Dwyane Dee HTN Hyperlipidemia COPD, nocturnal O2 prn Back pain, chronic: See visit 10/12/2016 CV: -CAD, Dr. Ron Parker Dr Meda Coffee S/p  stenting 2 to the RCA remote past and the third RCA stent in  2006, negative stress test in 2012, LVEF 55% on echo 2015 -Peripheral vascular disease, stents lower extremity 2009  ED Polycythemia: Saw hematology 11-2018, felt to be due to chronic respiratory failure.  Not likely polycythemia vera  PLAN    ROV but did report diarrhea Diarrhea: Started last night, several episodes, nontoxic-appearing, no orthostatic symptoms. Has been on metformin for ~ 10 days, if sxs cont, s/e from metformin?. Plan: Fluids, Imodium OTC as needed, hold Jardiance for couple of days.  Good hydration. DM Saw Endo 2 weeks ago, was recommended to add metformin.  Also on Trulicity, Jardiance. HTN: BP today is very good, continue amlodipine, metoprolol.  Check BMP in 1 week.   Cholesterol: on simva, cards added praluent, never started, rec to reach out to cards  GERD: At [place] protonix was added to Pecid, nocturnal GERD improved Fatigue: see next  COPD:   see LOV day time l O2 was added to the nocturnal O2,  the supply company never provided him w/ diurnal O2; currently feels he does not need it, fatigue is better  RTC for BMP in 1 week Routine checkup 4 months   In addition to routine medical problems I addressed a acute problem (diarrhea)

## 2022-08-01 ENCOUNTER — Other Ambulatory Visit: Payer: Self-pay

## 2022-08-01 DIAGNOSIS — I2510 Atherosclerotic heart disease of native coronary artery without angina pectoris: Secondary | ICD-10-CM

## 2022-08-01 DIAGNOSIS — I739 Peripheral vascular disease, unspecified: Secondary | ICD-10-CM

## 2022-08-01 DIAGNOSIS — E1159 Type 2 diabetes mellitus with other circulatory complications: Secondary | ICD-10-CM

## 2022-08-01 DIAGNOSIS — E782 Mixed hyperlipidemia: Secondary | ICD-10-CM

## 2022-08-01 MED ORDER — PRALUENT 75 MG/ML ~~LOC~~ SOAJ: 1.0000 mL | SUBCUTANEOUS | 3 refills | Status: DC

## 2022-08-01 NOTE — Telephone Encounter (Signed)
Pt walked into clinic today. Spoke with a pt advocate, Amber who relayed message that pt needs a refill on his praluent to [facility]. Refill placed. Amber let him know that this prescription would be sent in.

## 2022-08-02 ENCOUNTER — Other Ambulatory Visit: Payer: Self-pay | Admitting: Pharmacist

## 2022-08-02 ENCOUNTER — Other Ambulatory Visit: Payer: Self-pay

## 2022-08-02 DIAGNOSIS — E782 Mixed hyperlipidemia: Secondary | ICD-10-CM

## 2022-08-02 DIAGNOSIS — E1159 Type 2 diabetes mellitus with other circulatory complications: Secondary | ICD-10-CM

## 2022-08-02 DIAGNOSIS — I739 Peripheral vascular disease, unspecified: Secondary | ICD-10-CM

## 2022-08-02 DIAGNOSIS — I2510 Atherosclerotic heart disease of native coronary artery without angina pectoris: Secondary | ICD-10-CM

## 2022-08-02 MED ORDER — PRALUENT 75 MG/ML ~~LOC~~ SOAJ: 1.0000 mL | SUBCUTANEOUS | 3 refills | Status: DC

## 2022-08-02 NOTE — Telephone Encounter (Signed)
Patient walked in to ask about Praluent Rx. [facility] and [facility] "could not handle the prescription."  Printed Rx and had Dr Orinda Kenner sign. Included last chart notes and gave to patient.

## 2022-08-07 ENCOUNTER — Other Ambulatory Visit (INDEPENDENT_AMBULATORY_CARE_PROVIDER_SITE_OTHER): Payer: Medicare Other

## 2022-08-07 DIAGNOSIS — I10 Essential (primary) hypertension: Secondary | ICD-10-CM | POA: Diagnosis not present

## 2022-08-07 LAB — BASIC METABOLIC PANEL
BUN: 14 mg/dL (ref 6–23)
CO2: 25 mEq/L (ref 19–32)
Calcium: 8.9 mg/dL (ref 8.4–10.5)
Chloride: 107 mEq/L (ref 96–112)
Creatinine, Ser: 1.19 mg/dL (ref 0.40–1.50)
GFR: 56.12 mL/min — ABNORMAL LOW (ref >60.00)
Glucose, Bld: 128 mg/dL — ABNORMAL HIGH (ref 70–99)
Potassium: 3.8 mEq/L (ref 3.5–5.1)
Sodium: 140 mEq/L (ref 135–145)

## 2022-08-14 ENCOUNTER — Telehealth: Payer: Self-pay | Admitting: Internal Medicine

## 2022-08-14 DIAGNOSIS — J449 Chronic obstructive pulmonary disease, unspecified: Secondary | ICD-10-CM

## 2022-08-14 NOTE — Telephone Encounter (Signed)
Called [facility] to speak to [facility] and was told that he was currently not available to take the call. They said they would send him a message to call office back. Will await a return call.

## 2022-08-16 LAB — HM DIABETES EYE EXAM

## 2022-08-17 NOTE — Telephone Encounter (Signed)
Patient checking for POC [facility]. DME company [facility] [employer] [facility] [employer]. Patient phone number is [phone].

## 2022-08-17 NOTE — Telephone Encounter (Signed)
ATC LVMTCB x 1. POC order pended

## 2022-08-17 NOTE — Telephone Encounter (Signed)
Pt is requesting a POC order be sent to [facility] because he was informed that [place] [facility] only needed an order to get pt a new POC. Dr. Annamaria Boots can we place a POC order?

## 2022-08-17 NOTE — Telephone Encounter (Signed)
Yes thanks 

## 2022-08-21 NOTE — Telephone Encounter (Signed)
Patient is returning phone call. Patient phone number is [phone].

## 2022-08-21 NOTE — Telephone Encounter (Signed)
Spoke with pt and placed POC to [place] [facility]. Nothing further needed at this time.

## 2022-09-27 ENCOUNTER — Other Ambulatory Visit: Payer: Self-pay | Admitting: Internal Medicine

## 2022-10-30 ENCOUNTER — Other Ambulatory Visit (INDEPENDENT_AMBULATORY_CARE_PROVIDER_SITE_OTHER): Payer: Medicare Other

## 2022-10-30 DIAGNOSIS — E1165 Type 2 diabetes mellitus with hyperglycemia: Secondary | ICD-10-CM

## 2022-10-30 LAB — BASIC METABOLIC PANEL
BUN: 16 mg/dL (ref 6–23)
CO2: 23 mEq/L (ref 19–32)
Calcium: 9.7 mg/dL (ref 8.4–10.5)
Chloride: 105 mEq/L (ref 96–112)
Creatinine, Ser: 1.24 mg/dL (ref 0.40–1.50)
GFR: 53.33 mL/min — ABNORMAL LOW (ref >60.00)
Glucose, Bld: 139 mg/dL — ABNORMAL HIGH (ref 70–99)
Potassium: 3.6 mEq/L (ref 3.5–5.1)
Sodium: 139 mEq/L (ref 135–145)

## 2022-10-30 LAB — HEMOGLOBIN A1C: Hgb A1c MFr Bld: 7.4 % — ABNORMAL HIGH (ref 4.6–6.5)

## 2022-11-02 ENCOUNTER — Ambulatory Visit (INDEPENDENT_AMBULATORY_CARE_PROVIDER_SITE_OTHER): Payer: Medicare Other | Admitting: Endocrinology

## 2022-11-02 ENCOUNTER — Encounter: Payer: Self-pay | Admitting: Endocrinology

## 2022-11-02 VITALS — BP 140/86 | HR 84 | Ht 74.0 in | Wt 229.8 lb

## 2022-11-02 DIAGNOSIS — D751 Secondary polycythemia: Secondary | ICD-10-CM

## 2022-11-02 DIAGNOSIS — E1165 Type 2 diabetes mellitus with hyperglycemia: Secondary | ICD-10-CM

## 2022-11-02 DIAGNOSIS — I10 Essential (primary) hypertension: Secondary | ICD-10-CM | POA: Diagnosis not present

## 2022-11-02 MED ORDER — EMPAGLIFLOZIN 10 MG PO TABS: 10.0000 mg | ORAL_TABLET | Freq: Every day | ORAL | 2 refills | Status: DC

## 2022-11-02 NOTE — Progress Notes (Signed)
=         Patient ID: Jonathan [name]., male   DOB: 10/04/1935, 87 y.o.   MRN: [id number]    Reason for Appointment:  Follow-up of various problems  History of Present Illness:          Diagnosis: Type 2 diabetes mellitus, date of diagnosis:  2009      Past history: He is not clear when his diabetes was diagnosed but according to hospital records it may have been in 2009. Most likely he was placed on metformin initially and at some point Amaryl added. In 2013 it was also given Januvia presumably to improve his control. However appears that his A1c has been consistently over 7% Janumet was started instead of metformin and Januvia separately in 2013  He was started on Trulicity in [id number] instead of Victoza because excessive bruising on his abdomen  Later in 01/2015 he was switched to Bydureon because of insurance noncoverage of his Trulicity  Recent history:   Non-insulin hypoglycemic drugs are: Metformin 500 mg a.m. and [id number] 9 PM, Trulicity 3.0  mg weekly, Jardiance 10 mg, half daily  His A1c is 7.4 compared to 7  Fructosamine last 399 compared to 288  Current blood sugar patterns and problems: He is still using half a tablet of Jardiance 10 mg even though he was only supposed to do 1/2 tablet when he was getting the 25 mg prescription However his blood sugars at home are not consistently high and averaging only 129 overall Most of his high readings appear to be in the mornings and only rarely in the evenings Lab glucose fasting was 139 No side effects with metformin or Trulicity which he takes regularly As before is not doing much formal exercise but just busy with household activities and helping with his wife Diet is generally well-balanced without excess snacking Has taken Trulicity every week regularly Weight is about the same      Side effects from medications have been: None  Glucose monitoring:  done usually once a day       Glucometer:  FreeStyle  Blood Glucose  readings from monitor review   PRE-MEAL Mornings Lunch Evening Bedtime Overall  Glucose range: 115-152  73-168    Mean/median: 155  128  129   Previously   PRE-MEAL Fasting Lunch Dinner Bedtime Overall  Glucose range: 129-147      Mean/median:     132   POST-MEAL PC Breakfast PC Lunch PC Dinner  Glucose range:   165  Mean/median:        Meals: 3 meals per day. eating egg/meat bread for breakfast, dinner 6-7 PM     Dietician visit: Most recent: 4/16 .               Weight history:  Wt Readings from Last 3 Encounters:  11/02/22 229 lb 12.8 oz (104.2 kg)  07/31/22 225 lb 8 oz (102.3 kg)  07/18/22 230 lb 9.6 oz (104.6 kg)   Glycemic control:   Lab Results  Component Value Date   HGBA1C 7.4 (H) 10/30/2022   HGBA1C 7.0 (H) 07/13/2022   HGBA1C 7.1 (H) 04/09/2022   Lab Results  Component Value Date   MICROALBUR 6.4 (H) 04/09/2022   LDLCALC 65 01/12/2022   CREATININE 1.24 10/30/2022    Lab Results  Component Value Date   FRUCTOSAMINE 319 (H) 12/28/2021   FRUCTOSAMINE 288 (H) 05/24/2020   FRUCTOSAMINE 284 08/26/2019    OTHER active problems addressed today:  See review of systems  Lab on 10/30/2022  Component Date Value Ref Range Status   Hgb A1c MFr Bld 10/30/2022 7.4 (H)  4.6 - 6.5 % Final   Glycemic Control Guidelines for People with Diabetes:Non Diabetic:  <6%Goal of Therapy: <7%Additional Action Suggested:  >8%    Sodium 10/30/2022 139  135 - 145 mEq/L Final   Potassium 10/30/2022 3.6  3.5 - 5.1 mEq/L Final   Chloride 10/30/2022 105  96 - 112 mEq/L Final   CO2 10/30/2022 23  19 - 32 mEq/L Final   Glucose, Bld 10/30/2022 139 (H)  70 - 99 mg/dL Final   BUN 10/30/2022 16  6 - 23 mg/dL Final   Creatinine, Ser 10/30/2022 1.24  0.40 - 1.50 mg/dL Final   GFR 10/30/2022 53.33 (L)  >60.00 mL/min Final   Calculated using the CKD-EPI Creatinine Equation (2021)   Calcium 10/30/2022 9.7  8.4 - 10.5 mg/dL Final     Allergies as of 11/02/2022   No Known Allergies       Medication List        Accurate as of November 02, 2022 11:15 AM. If you have any questions, ask your nurse or doctor.          albuterol 108 (90 Base) MCG/ACT inhaler Commonly known as: VENTOLIN HFA USE 1 TO 2 INHALATIONS EVERY 6 HOURS AS NEEDED FOR WHEEZING OR SHORTNESS OF BREATH   amLODipine 5 MG tablet Commonly known as: NORVASC Take 1 tablet (5 mg total) by mouth daily.   aspirin 81 MG tablet Take 81 mg by mouth daily.   clopidogrel 75 MG tablet Commonly known as: PLAVIX Take 1 tablet (75 mg total) by mouth daily with breakfast.   empagliflozin 10 MG Tabs tablet Commonly known as: JARDIANCE Take 10 mg by mouth daily.   famotidine 20 MG tablet Commonly known as: PEPCID Take 20 mg by mouth 2 (two) times daily as needed for heartburn or indigestion.   FreeStyle Freedom Lite w/Device Kit Use Freestyle Freedom Lite meter to check blood sugar twice daily. DX:E11.65   hydrOXYzine 25 MG tablet Commonly known as: ATARAX Take 1 tablet (25 mg total) by mouth every 6 (six) hours as needed.   metFORMIN 1000 MG tablet Commonly known as: GLUCOPHAGE Take 0.5 tablet in am and 1 whole one at dinner.   metoprolol succinate 100 MG 24 hr tablet Commonly known as: Toprol XL Take 1 tablet (100 mg total) by mouth daily.   multivitamin with minerals Tabs tablet Take 1 tablet by mouth daily.   ondansetron 4 MG tablet Commonly known as: ZOFRAN Take 1 tablet (4 mg total) by mouth every 8 (eight) hours as needed for nausea or vomiting.   OneTouch Verio test strip Generic drug: glucose blood Use as instructed to check blood sugar by rotation once a day   OXYGEN Inhale 2 L/min into the lungs continuous.   pantoprazole 40 MG tablet Commonly known as: PROTONIX Take 1 tablet (40 mg total) by mouth daily.   Praluent 75 MG/ML Soaj Generic drug: Alirocumab Inject 1 mL into the skin every 14 (fourteen) days.   simvastatin 20 MG tablet Commonly known as: ZOCOR Take 20 mg  by mouth daily.   Trulicity 3 0000000 Sopn Generic drug: Dulaglutide INJECT 3 MG UNDER THE SKIN ONCE A WEEK        Allergies: No Known Allergies  Past Medical History:  Diagnosis Date   CAD (coronary artery disease)    Two RCA stents remotely /  3rd RCA stent 2006   Colon polyps    s/p several Cscopes.   COPD (chronic obstructive pulmonary disease) (HCC)    on O2, nocturnal   Diabetes mellitus    dx aprox 2009   ED (erectile dysfunction)    has a vacumm device   Ejection fraction    Hyperlipidemia    dx in 90s   Hypertension    dx in the 40   Hypogonadism male    PVD (peripheral vascular disease) ([place])    s/p stents at LE 2009, Dr Einar Gip   Shortness of breath    O2 Sat dropped to 82% walking on the treadmill, September, 2012    Past Surgical History:  Procedure Laterality Date   APPENDECTOMY     CORONARY STENT INTERVENTION N/A 01/12/2022   Procedure: CORONARY STENT INTERVENTION;  Surgeon: Martinique, Peter M, MD;  Location: [facility];  Service: Cardiovascular;  Laterality: N/A;   RIGHT/LEFT HEART CATH AND CORONARY ANGIOGRAPHY N/A 01/11/2022   Procedure: RIGHT/LEFT HEART CATH AND CORONARY ANGIOGRAPHY;  Surgeon: Belva Crome, MD;  Location: [facility];  Service: Cardiovascular;  Laterality: N/A;   TONSILLECTOMY      Family History  Problem Relation Age of Onset   Heart disease Father    Hypertension Father    Stroke Father    Diabetes Paternal Aunt    Diabetes Maternal Grandmother    Diabetes Other        GM, nephews, many family members   Hyperlipidemia Other        ?   Prostate cancer Brother    Colon cancer Neg Hx     Social History:  reports that he quit smoking about 45 years ago. His smoking use included cigarettes. He has a 60.00 pack-year smoking history. He has never used smokeless tobacco. He reports current alcohol use. He reports that he does not use drugs.    Review of Systems        Lipids: He has been on treatment with  simvastatin 20 mg from his PCP below with good control as follows       Lab Results  Component Value Date   CHOL 117 01/12/2022   CHOL 127 06/27/2021   CHOL 141 02/14/2021   Lab Results  Component Value Date   HDL 36 (L) 01/12/2022   HDL 42.30 06/27/2021   HDL 38.30 (L) 02/14/2021   Lab Results  Component Value Date   LDLCALC 65 01/12/2022   LDLCALC 63 06/27/2021   LDLCALC 64 02/14/2021   Lab Results  Component Value Date   TRIG 81 01/12/2022   TRIG 107.0 06/27/2021   TRIG 195.0 (H) 02/14/2021   Lab Results  Component Value Date   CHOLHDL 3.3 01/12/2022   CHOLHDL 3 06/27/2021   CHOLHDL 4 02/14/2021   Lab Results  Component Value Date   LDLDIRECT 55.0 01/01/2018                HYPOGONADISM He had decreased libido and erectile dysfunction previously and was found to have hypogonadism, probably in 2011. He did have a slightly low free testosterone level in 2012 on treatment Prolactin level normal  Previously on AndroGel With his hemoglobin going up to 19.1 in August 2019 he has been told not to take any testosterone He was given clomiphene as a trial but he did not try this  Testosterone level is persistently low without testosterone therapy  Again does not think he has unusual fatigue  Lab Results  Component Value Date   TESTOSTERONE 256.52 (L) 12/28/2021   TESTOSTERONE 245.96 (L) 10/24/2021   TESTOSTERONE 215.23 (L) 05/24/2020    He has been followed by hematologist for erythrocytosis which is improved     Latest Ref Rng & Units 06/04/2022    9:39 AM 01/29/2022    6:32 PM 01/17/2022    3:57 PM  CBC  WBC 4.0 - 10.5 K/uL 9.8  7.4  7.4   Hemoglobin 13.0 - 17.0 g/dL 17.1  18.3  15.7   Hematocrit 39.0 - 52.0 % 50.6  56.0  47.4   Platelets 150.0 - 400.0 K/uL 247.0  229  256.0         HYPERTENSION: This is followed by PCP and is on 50 mg of losartan along with half Maxzide, amlodipine 5 mg and metoprolol   He is checking at home also: Recent readings  about 140/70s  BP Readings from Last 3 Encounters:  11/02/22 (!) 140/86  07/31/22 132/70  07/18/22 124/72   RENAL dysfunction: His creatinine is stable   Lab Results  Component Value Date   CREATININE 1.24 10/30/2022   CREATININE 1.19 08/07/2022   CREATININE 1.14 07/13/2022   NEUROPATHY: He has had persistent sensory loss, also has tingling in his distal feet   LABS:    Physical Examination:  BP (!) 140/86 (BP Location: Left Arm, Patient Position: Sitting, Cuff Size: Normal)   Pulse 84   Ht 6' 2"$  (1.88 m)   Wt 229 lb 12.8 oz (104.2 kg)   SpO2 95%   BMI 29.50 kg/m       ASSESSMENT/PLAN:   Diabetes type 2, with mild obesity, non-insulin-dependent  His A1c is stable at 7.0  Currently on 3 mg Trulicity, 5 mg Jardiance and Metformin 1500 mg daily  As above his blood sugars are generally well-controlled considering his age and comorbid conditions However by mistake he is only taking half of the 10 mg Jardiance instead of the full tablet Continue Trulicity 3 mg, 4.5 not available through his pharmacy   HYPERTENSION: Blood pressure is high normal May improve with increasing Jardiance back to 10 mg  RENAL dysfunction: this is stable  Hypogonadism: Not symptomatic despite mildly low levels and will not check unless he is symptomatic  There are no Patient Instructions on file for this visit.    Elayne Snare 11/02/2022, 11:15 AM   Note: This office note was prepared with Dragon voice recognition system technology. Any transcriptional errors that result from this process are unintentional.

## 2022-11-02 NOTE — Patient Instructions (Addendum)
Jardince 61m daily

## 2022-11-14 ENCOUNTER — Telehealth: Payer: Self-pay

## 2022-11-14 NOTE — Telephone Encounter (Signed)
O2 recert signed and faxed back to [facility] at [phone]. Form sent for scanning.

## 2022-11-24 ENCOUNTER — Other Ambulatory Visit: Payer: Self-pay | Admitting: Endocrinology

## 2022-11-24 DIAGNOSIS — E1165 Type 2 diabetes mellitus with hyperglycemia: Secondary | ICD-10-CM

## 2022-11-29 ENCOUNTER — Telehealth: Payer: Self-pay | Admitting: Cardiovascular Disease

## 2022-11-29 ENCOUNTER — Other Ambulatory Visit (HOSPITAL_COMMUNITY): Payer: Self-pay

## 2022-11-29 ENCOUNTER — Telehealth: Payer: Self-pay

## 2022-11-29 ENCOUNTER — Encounter: Payer: Self-pay | Admitting: Internal Medicine

## 2022-11-29 ENCOUNTER — Ambulatory Visit (INDEPENDENT_AMBULATORY_CARE_PROVIDER_SITE_OTHER): Payer: Medicare Other | Admitting: Internal Medicine

## 2022-11-29 VITALS — BP 136/88 | HR 68 | Temp 98.0°F | Resp 16 | Ht 74.0 in | Wt 229.2 lb

## 2022-11-29 DIAGNOSIS — E1159 Type 2 diabetes mellitus with other circulatory complications: Secondary | ICD-10-CM | POA: Diagnosis not present

## 2022-11-29 DIAGNOSIS — N401 Enlarged prostate with lower urinary tract symptoms: Secondary | ICD-10-CM | POA: Diagnosis not present

## 2022-11-29 DIAGNOSIS — R399 Unspecified symptoms and signs involving the genitourinary system: Secondary | ICD-10-CM | POA: Diagnosis not present

## 2022-11-29 DIAGNOSIS — R351 Nocturia: Secondary | ICD-10-CM | POA: Diagnosis not present

## 2022-11-29 DIAGNOSIS — I10 Essential (primary) hypertension: Secondary | ICD-10-CM | POA: Diagnosis not present

## 2022-11-29 LAB — URINALYSIS, ROUTINE W REFLEX MICROSCOPIC
Bilirubin Urine: NEGATIVE
Hgb urine dipstick: NEGATIVE
Ketones, ur: NEGATIVE
Leukocytes,Ua: NEGATIVE
Nitrite: NEGATIVE
RBC / HPF: NONE SEEN (ref 0–?)
Specific Gravity, Urine: 1.025 (ref 1.000–1.030)
Total Protein, Urine: 30 — AB
Urine Glucose: >=1000 — AB
Urobilinogen, UA: 0.2 (ref 0.0–1.0)
WBC, UA: NONE SEEN (ref 0–?)
pH: 6 (ref 5.0–8.0)

## 2022-11-29 LAB — PSA: PSA: 0.93 ng/mL (ref 0.10–4.00)

## 2022-11-29 NOTE — Progress Notes (Signed)
Subjective:    Patient ID: Jonathan Leather., male    DOB: Nov 26, 1935, 87 y.o.   MRN: [id number]  DOS:  11/29/2022 Type of visit - description: f/u  Since the last office visit is feeling well. GERD: Resolved Complain of nocturia for the last few months.  No dysuria or difficulty urinating but some urgency noted.  Very rarely has diurnal sxs .  No blood in the urine which is  actually very clear. No nausea vomiting.  No diarrhea Sometimes has dry mouth  Review of Systems See above   Past Medical History:  Diagnosis Date   CAD (coronary artery disease)    Two RCA stents remotely / 3rd RCA stent 2006   Colon polyps    s/p several Cscopes.   COPD (chronic obstructive pulmonary disease) (HCC)    on O2, nocturnal   Diabetes mellitus    dx aprox 2009   ED (erectile dysfunction)    has a vacumm device   Ejection fraction    Hyperlipidemia    dx in 90s   Hypertension    dx in the 29   Hypogonadism male    PVD (peripheral vascular disease) ([place])    s/p stents at LE 2009, Dr Einar Gip   Shortness of breath    O2 Sat dropped to 82% walking on the treadmill, September, 2012    Past Surgical History:  Procedure Laterality Date   APPENDECTOMY     CORONARY STENT INTERVENTION N/A 01/12/2022   Procedure: CORONARY STENT INTERVENTION;  Surgeon: Martinique, Peter M, MD;  Location: [facility];  Service: Cardiovascular;  Laterality: N/A;   RIGHT/LEFT HEART CATH AND CORONARY ANGIOGRAPHY N/A 01/11/2022   Procedure: RIGHT/LEFT HEART CATH AND CORONARY ANGIOGRAPHY;  Surgeon: Belva Crome, MD;  Location: [facility];  Service: Cardiovascular;  Laterality: N/A;   TONSILLECTOMY      Current Outpatient Medications  Medication Instructions   albuterol (VENTOLIN HFA) 108 (90 Base) MCG/ACT inhaler USE 1 TO 2 INHALATIONS EVERY 6 HOURS AS NEEDED FOR WHEEZING OR SHORTNESS OF BREATH   Alirocumab (PRALUENT) 75 MG/ML SOAJ 1 mL, Subcutaneous, Every 14 days   amLODipine (NORVASC) 5 mg, Oral,  Daily   aspirin 81 mg, Oral, Daily,     Blood Glucose Monitoring Suppl (FREESTYLE FREEDOM LITE) w/Device KIT Use Freestyle Freedom Lite meter to check blood sugar twice daily. DX:E11.65   clopidogrel (PLAVIX) 75 mg, Oral, Daily with breakfast   Dulaglutide (TRULICITY) 3 0000000 SOPN INJECT 3 MG UNDER THE SKIN ONCE A WEEK   empagliflozin (JARDIANCE) 10 mg, Oral, Daily   famotidine (PEPCID) 20 mg, Oral, 2 times daily PRN   glucose blood (FREESTYLE LITE) test strip USE AS INSTRUCTED   hydrOXYzine (ATARAX) 25 mg, Oral, Every 6 hours PRN   metFORMIN (GLUCOPHAGE) 1000 MG tablet Take 0.5 tablet in am and 1 whole one at dinner.   metoprolol succinate (TOPROL XL) 100 mg, Oral, Daily   Multiple Vitamin (MULTIVITAMIN WITH MINERALS) TABS tablet 1 tablet, Oral, Daily   ondansetron (ZOFRAN) 4 mg, Oral, Every 8 hours PRN   OXYGEN 2 L/min, Continuous   pantoprazole (PROTONIX) 40 mg, Oral, Daily   simvastatin (ZOCOR) 20 mg, Oral, Daily       Objective:   Physical Exam BP 136/88   Pulse 68   Temp 98 F (36.7 C) (Oral)   Resp 16   Ht '6\' 2"'$  (1.88 m)   Wt 229 lb 4 oz (104 kg)   SpO2  94%   BMI 29.43 kg/m  General:   Well developed, NAD, BMI noted.  HEENT:  Normocephalic . Face symmetric, atraumatic Lungs:  CTA B Normal respiratory effort, no intercostal retractions, no accessory muscle use. Heart: RRR,  no murmur.  Abdomen:  Not distended, soft, non-tender. No rebound or rigidity.   Skin: Not pale. Not jaundice DRE: Normal sphincter tone, very difficult to reach his prostate but seems normal.  Brown stools. Lower extremities: no pretibial edema bilaterally  Neurologic:  alert & oriented X3.  Speech normal, gait appropriate for age and unassisted Psych--  Cognition and judgment appear intact.  Cooperative with normal attention span and concentration.  Behavior appropriate. No anxious or depressed appearing.     Assessment     Assessment DM dx [id number], complicated by CAD, PVD, mild DM  neuropathy. Sees Dr. Dwyane Dee Hypogonadism. Sees Dr. Dwyane Dee HTN Hyperlipidemia COPD, nocturnal O2 prn Back pain, chronic: See visit 10/12/2016 CV: -CAD, Dr. Ron Parker Dr [facility] S/p  stenting 2 to the RCA remote past and the third RCA stent in  2006, negative stress test in 2012, LVEF 55% on echo 2015 -Peripheral vascular disease, stents lower extremity 2009  ED Polycythemia: Saw hematology 11-2018, felt to be due to chronic respiratory failure.  Not likely polycythemia vera  PLAN    HTN: Recent BMP okay, no change. Hyperlipidemia: Again encouraged patient to reach out to cardiology for Praluent DM: Saw Endo 11/02/2022: A1c 7.0.  By mistake patient was taking Jardiance 5 mg, it was increased to 10 mg. GERD: Last year he complained of nocturnal reflux, currently assx on self d/c  meds. LUTS: As described above, check PSA, UA urine culture.  Treat accordingly.  Recommend to reduce fluids before bedtime. COPD, nocturnal oxygen: Minimal symptoms use oxygen every night and sometimes during the daytime.  C/o dry mouth, recommend humidifier when he uses oxygen. RTC 4 to 5 months

## 2022-11-29 NOTE — Telephone Encounter (Signed)
Pharmacy Patient Advocate Encounter   Received notification from [employer] that prior authorization for PRALUENT '75MG'$ /ML is needed.    PA submitted on 11/29/22 Key [id number] Status is pending  Karie Soda, [place] Patient Advocate Specialist Direct Number: [phone] Fax: [phone]

## 2022-11-29 NOTE — Assessment & Plan Note (Signed)
HTN: Recent BMP okay, no change. Hyperlipidemia: Again encouraged patient to reach out to cardiology for Praluent DM: Saw Endo 11/02/2022: A1c 7.0.  By mistake patient was taking Jardiance 5 mg, it was increased to 10 mg. GERD: Last year he complained of nocturnal reflux, currently assx on self d/c  meds. LUTS: As described above, check PSA, UA urine culture.  Treat accordingly.  Recommend to reduce fluids before bedtime. COPD, nocturnal oxygen: Minimal symptoms use oxygen every night and sometimes during the daytime.  C/o dry mouth, recommend humidifier when he uses oxygen. RTC 4 to 5 months

## 2022-11-29 NOTE — Patient Instructions (Addendum)
Vaccines I recommend:  RSV vaccine  Please call cardiology regards Praluent for your cholesterol  Check the  blood pressure regularly BP GOAL is between 110/65 and  135/85. If it is consistently higher or lower, let me know  Use a humidifier with your oxygen  GO TO THE LAB : Get the blood work     [place], [place] back for checkup in 4 to 5 months

## 2022-11-29 NOTE — Telephone Encounter (Signed)
Patient walked in after PCP appt today following up about his Praluent -patient has not started this medication. Patient needs Rx to take to [facility] pharmacy to fill once PA is complete.

## 2022-11-30 ENCOUNTER — Other Ambulatory Visit (HOSPITAL_COMMUNITY): Payer: Self-pay

## 2022-11-30 LAB — URINE CULTURE
MICRO NUMBER:: 14692907
Result:: NO GROWTH
SPECIMEN QUALITY:: ADEQUATE

## 2022-11-30 NOTE — Telephone Encounter (Addendum)
P/a for praluent was denied and pts plan prefers a try/fail of repatha first.would you like  a p/a for repatha instead? Please see denial for praluent below

## 2022-12-03 ENCOUNTER — Other Ambulatory Visit (HOSPITAL_COMMUNITY): Payer: Self-pay

## 2022-12-03 MED ORDER — TAMSULOSIN HCL 0.4 MG PO CAPS: 0.4000 mg | ORAL_CAPSULE | Freq: Every day | ORAL | 1 refills | Status: DC

## 2022-12-03 MED ORDER — REPATHA SURECLICK 140 MG/ML ~~LOC~~ SOAJ: 140.0000 mg | SUBCUTANEOUS | 4 refills | Status: DC

## 2022-12-03 NOTE — Addendum Note (Signed)
Addended by: Rockne Menghini on: 12/03/2022 01:53 PM   Modules accepted: Orders

## 2022-12-03 NOTE — Addendum Note (Signed)
Addended byDamita Dunnings D on: 12/03/2022 07:58 AM   Modules accepted: Orders

## 2022-12-03 NOTE — Telephone Encounter (Signed)
Patient states he is returning phone call.

## 2022-12-03 NOTE — Telephone Encounter (Signed)
Pharmacy Patient Advocate Encounter  Prior Authorization for REPATHA 140 MG/ML INJ  has been approved.    PA# [id number] Effective dates: 11/03/22 through 09/16/2098  Karie Soda, [place] Patient Advocate Specialist Direct Number: [phone] Fax: [phone]

## 2022-12-03 NOTE — Telephone Encounter (Signed)
LMOM for patient to return call; have signed prescription.

## 2022-12-03 NOTE — Telephone Encounter (Signed)
Pharmacy Patient Advocate Encounter   Received notification from [employer] that prior authorization for REPATHA 140 MG/ML INJ is needed.    PA submitted on 12/03/22 Key [id number] Status is pending  Karie Soda, [place] Patient Advocate Specialist Direct Number: [phone] Fax: [phone]

## 2022-12-07 ENCOUNTER — Telehealth: Payer: Self-pay | Admitting: Pharmacist

## 2022-12-07 NOTE — Telephone Encounter (Signed)
LVM written prescription for Repatha is ready for pick up.

## 2022-12-07 NOTE — Telephone Encounter (Signed)
Gave patient written Rx as well as chart notes to take to [facility]

## 2022-12-17 ENCOUNTER — Telehealth: Payer: Self-pay | Admitting: Internal Medicine

## 2022-12-17 MED ORDER — HYDROXYZINE HCL 25 MG PO TABS: 12.5000 mg | ORAL_TABLET | Freq: Three times a day (TID) | ORAL | 0 refills | Status: DC | PRN

## 2022-12-17 NOTE — Telephone Encounter (Signed)
Patient is here bringing his wife. He told me that yesterday he felt short of breath, EMS arrived, "everything was okay". Was not recommended to go to the ER. He thinks he had anxiety and request a refill on Atarax. I will send that, to be used as needed, watch for somnolence. At the same time I told him that I cannot assess any other symptoms in between visits and he needs to be seen if he is suspect he has any problem with difficulty breathing. Again he said is just anxiety.

## 2022-12-18 ENCOUNTER — Emergency Department (HOSPITAL_COMMUNITY): Payer: Medicare Other

## 2022-12-18 ENCOUNTER — Other Ambulatory Visit: Payer: Self-pay

## 2022-12-18 ENCOUNTER — Emergency Department (HOSPITAL_COMMUNITY)
Admission: EM | Admit: 2022-12-18 | Discharge: 2022-12-18 | Disposition: A | Discharge status: Home / Self Care | Payer: Medicare Other | Attending: Emergency Medicine | Admitting: Emergency Medicine

## 2022-12-18 ENCOUNTER — Encounter (HOSPITAL_COMMUNITY): Payer: Self-pay

## 2022-12-18 DIAGNOSIS — J810 Acute pulmonary edema: Secondary | ICD-10-CM | POA: Diagnosis not present

## 2022-12-18 DIAGNOSIS — R0609 Other forms of dyspnea: Secondary | ICD-10-CM

## 2022-12-18 DIAGNOSIS — Z1152 Encounter for screening for COVID-19: Secondary | ICD-10-CM | POA: Diagnosis not present

## 2022-12-18 DIAGNOSIS — J449 Chronic obstructive pulmonary disease, unspecified: Secondary | ICD-10-CM | POA: Insufficient documentation

## 2022-12-18 DIAGNOSIS — Z7982 Long term (current) use of aspirin: Secondary | ICD-10-CM | POA: Insufficient documentation

## 2022-12-18 DIAGNOSIS — R0600 Dyspnea, unspecified: Secondary | ICD-10-CM | POA: Diagnosis not present

## 2022-12-18 DIAGNOSIS — R0602 Shortness of breath: Secondary | ICD-10-CM | POA: Diagnosis not present

## 2022-12-18 LAB — CBC WITH DIFFERENTIAL/PLATELET
Abs Immature Granulocytes: 0.01 10*3/uL (ref 0.00–0.07)
Basophils Absolute: 0.1 10*3/uL (ref 0.0–0.1)
Basophils Relative: 1 %
Eosinophils Absolute: 0.4 10*3/uL (ref 0.0–0.5)
Eosinophils Relative: 4 %
HCT: 48.1 % (ref 39.0–52.0)
Hemoglobin: 16.3 g/dL (ref 13.0–17.0)
Immature Granulocytes: 0 %
Lymphocytes Relative: 43 %
Lymphs Abs: 3.3 10*3/uL (ref 0.7–4.0)
MCH: 32.9 pg (ref 26.0–34.0)
MCHC: 33.9 g/dL (ref 30.0–36.0)
MCV: 97.2 fL (ref 80.0–100.0)
Monocytes Absolute: 1 10*3/uL (ref 0.1–1.0)
Monocytes Relative: 12 %
Neutro Abs: 3.2 10*3/uL (ref 1.7–7.7)
Neutrophils Relative %: 40 %
Platelets: 252 10*3/uL (ref 150–400)
RBC: 4.95 MIL/uL (ref 4.22–5.81)
RDW: 15.5 % (ref 11.5–15.5)
WBC: 7.9 10*3/uL (ref 4.0–10.5)
nRBC: 0 % (ref 0.0–0.2)

## 2022-12-18 LAB — BASIC METABOLIC PANEL
Anion gap: 11 (ref 5–15)
BUN: 18 mg/dL (ref 8–23)
CO2: 25 mmol/L (ref 22–32)
Calcium: 9.2 mg/dL (ref 8.9–10.3)
Chloride: 106 mmol/L (ref 98–111)
Creatinine, Ser: 1.2 mg/dL (ref 0.61–1.24)
GFR, Estimated: 60 mL/min — ABNORMAL LOW (ref >60)
Glucose, Bld: 132 mg/dL — ABNORMAL HIGH (ref 70–99)
Potassium: 3.8 mmol/L (ref 3.5–5.1)
Sodium: 142 mmol/L (ref 135–145)

## 2022-12-18 LAB — SARS CORONAVIRUS 2 BY RT PCR: SARS Coronavirus 2 by RT PCR: NEGATIVE

## 2022-12-18 LAB — TROPONIN I (HIGH SENSITIVITY): Troponin I (High Sensitivity): 17 ng/L (ref <18)

## 2022-12-18 LAB — BRAIN NATRIURETIC PEPTIDE: B Natriuretic Peptide: 431.8 pg/mL — ABNORMAL HIGH (ref 0.0–100.0)

## 2022-12-18 MED ORDER — FUROSEMIDE 10 MG/ML IJ SOLN
20.0000 mg | Freq: Once | INTRAMUSCULAR | Status: AC
  Administered 2022-12-18: 20 mg via INTRAVENOUS
  Filled 2022-12-18: qty 2

## 2022-12-18 MED ORDER — ALBUTEROL SULFATE HFA 108 (90 BASE) MCG/ACT IN AERS: 2.0000 | INHALATION_SPRAY | RESPIRATORY_TRACT | Status: DC | PRN

## 2022-12-18 MED ORDER — IPRATROPIUM-ALBUTEROL 0.5-2.5 (3) MG/3ML IN SOLN
3.0000 mL | Freq: Once | RESPIRATORY_TRACT | Status: AC
  Administered 2022-12-18: 3 mL via RESPIRATORY_TRACT
  Filled 2022-12-18: qty 3

## 2022-12-18 MED ORDER — FUROSEMIDE 20 MG PO TABS: 20.0000 mg | ORAL_TABLET | Freq: Every day | ORAL | 0 refills | Status: DC

## 2022-12-18 MED ORDER — MAGNESIUM SULFATE 2 GM/50ML IV SOLN
2.0000 g | Freq: Once | INTRAVENOUS | Status: AC
  Administered 2022-12-18: 2 g via INTRAVENOUS
  Filled 2022-12-18: qty 50

## 2022-12-18 NOTE — ED Triage Notes (Signed)
Pt arrived POV from home c/o [facility] x2 days. Pt wears oxygen at night or as needed but has used it the last 2 days continuously.

## 2022-12-18 NOTE — ED Provider Notes (Signed)
[facility] Provider Note   CSN: [id number] Arrival date & time: 12/18/22  1317     History {Add pertinent medical, surgical, social history, OB history to HPI:1} Chief Complaint  Patient presents with   Shortness of Breath    Jonathan [name]. is a 87 y.o. male.  He has a history of COPD uses oxygen at night and sometimes with exertion.  He said for the last few days he has needed to use his oxygen continuously and remains short of breath and is having difficulty performing regular activities.  No fevers or cough no chest pain abdominal pain vomiting diarrhea.  He said his wife is having progressive dementia and so he has been helping manage her a lot and it has been really taxing him.  The history is provided by the patient.  Shortness of Breath Severity:  Moderate Onset quality:  Gradual Duration:  2 days Timing:  Constant Progression:  Worsening Chronicity:  New Relieved by:  Nothing Worsened by:  Activity Ineffective treatments:  Oxygen Associated symptoms: no abdominal pain, no chest pain, no diaphoresis, no fever, no hemoptysis, no sputum production and no vomiting   Risk factors: no tobacco use        Home Medications Prior to Admission medications   Medication Sig Start Date End Date Taking? Authorizing Provider  albuterol (VENTOLIN HFA) 108 (90 Base) MCG/ACT inhaler USE 1 TO 2 INHALATIONS EVERY 6 HOURS AS NEEDED FOR WHEEZING OR SHORTNESS OF BREATH 09/28/22   Young, Tarri Fuller D, MD  amLODipine (NORVASC) 5 MG tablet Take 1 tablet (5 mg total) by mouth daily. 06/28/20   Colon Branch, MD  aspirin 81 MG tablet Take 81 mg by mouth daily.    [provider]  Blood Glucose Monitoring Suppl (FREESTYLE FREEDOM LITE) w/Device KIT Use Freestyle Freedom Lite meter to check blood sugar twice daily. DX:E11.65 11/17/21   Elayne Snare, MD  clopidogrel (PLAVIX) 75 MG tablet Take 1 tablet (75 mg total) by mouth daily with breakfast.  06/19/22   Colon Branch, MD  Dulaglutide (TRULICITY) 3 0000000 SOPN INJECT 3 MG UNDER THE SKIN ONCE A WEEK 01/24/22   Elayne Snare, MD  empagliflozin (JARDIANCE) 10 MG TABS tablet Take 1 tablet (10 mg total) by mouth daily. 11/02/22   Elayne Snare, MD  Evolocumab (REPATHA SURECLICK) [id number] MG/ML SOAJ Inject 140 mg into the skin every 14 (fourteen) days. 12/03/22   Lorretta Harp, MD  glucose blood (FREESTYLE LITE) test strip USE AS INSTRUCTED 11/25/22   Elayne Snare, MD  hydrOXYzine (ATARAX) 25 MG tablet Take 0.5-1 tablets (12.5-25 mg total) by mouth every 8 (eight) hours as needed for anxiety. 12/17/22   Colon Branch, MD  metFORMIN (GLUCOPHAGE) 1000 MG tablet Take 0.5 tablet in am and 1 whole one at dinner. 04/16/22   Elayne Snare, MD  metoprolol succinate (TOPROL XL) 100 MG 24 hr tablet Take 1 tablet (100 mg total) by mouth daily. 07/28/18   Colon Branch, MD  Multiple Vitamin (MULTIVITAMIN WITH MINERALS) TABS tablet Take 1 tablet by mouth daily.    [provider]  ondansetron (ZOFRAN) 4 MG tablet Take 1 tablet (4 mg total) by mouth every 8 (eight) hours as needed for nausea or vomiting. Patient not taking: Reported on 11/29/2022 04/17/22   Colon Branch, MD  OXYGEN Inhale 2 L/min into the lungs at bedtime. PRN during day    [provider]  simvastatin (ZOCOR) 20  MG tablet Take 20 mg by mouth daily.    [provider]  tamsulosin (FLOMAX) 0.4 MG CAPS capsule Take 1 capsule (0.4 mg total) by mouth daily after supper. 12/03/22   Colon Branch, MD      Allergies    Patient has no known allergies.    Review of Systems   Review of Systems  Constitutional:  Negative for diaphoresis and fever.  Respiratory:  Positive for shortness of breath. Negative for hemoptysis and sputum production.   Cardiovascular:  Negative for chest pain.  Gastrointestinal:  Negative for abdominal pain and vomiting.    Physical Exam Updated Vital Signs BP (!) 167/77 (BP Location: Left Arm)   Pulse 70   Temp  97.6 F (36.4 C) (Oral)   Resp 20   Ht 6\' 2"  (1.88 m)   Wt 102.1 kg   SpO2 94%   BMI 28.89 kg/m  Physical Exam Vitals and nursing note reviewed.  Constitutional:      General: He is not in acute distress.    Appearance: He is well-developed.  HENT:     Head: Normocephalic and atraumatic.  Eyes:     Conjunctiva/sclera: Conjunctivae normal.  Cardiovascular:     Rate and Rhythm: Normal rate and regular rhythm.     Heart sounds: No murmur heard. Pulmonary:     Effort: Pulmonary effort is normal. No respiratory distress.     Breath sounds: Normal breath sounds.  Abdominal:     Palpations: Abdomen is soft.     Tenderness: There is no abdominal tenderness.  Musculoskeletal:        General: No swelling. Normal range of motion.     Cervical back: Neck supple.     Right lower leg: No tenderness. No edema.     Left lower leg: No tenderness. No edema.  Skin:    General: Skin is warm and dry.     Capillary Refill: Capillary refill takes less than 2 seconds.  Neurological:     General: No focal deficit present.     Mental Status: He is alert.     ED Results / Procedures / Treatments   Labs (all labs ordered are listed, but only abnormal results are displayed) Labs Reviewed  SARS CORONAVIRUS 2 BY RT PCR  BASIC METABOLIC PANEL  CBC WITH DIFFERENTIAL/PLATELET  BRAIN NATRIURETIC PEPTIDE  TROPONIN I (HIGH SENSITIVITY)    EKG EKG Interpretation  Date/Time:  Tuesday December 18 2022 13:13:39 EDT Ventricular Rate:  70 PR Interval:  238 QRS Duration: 78 QT Interval:  424 QTC Calculation: 457 R Axis:   57 Text Interpretation: Sinus rhythm with 1st degree A-V block Possible Anterior infarct , age undetermined Abnormal ECG When compared with ECG of 29-Jan-2022 13:11, No significant change since last tracing Confirmed by Aletta Edouard [phone]) on 12/18/2022 1:51:48 PM  Radiology No results found.  Procedures Procedures  {Document cardiac monitor, telemetry assessment procedure  when appropriate:1}  Medications Ordered in ED Medications  albuterol (VENTOLIN HFA) 108 (90 Base) MCG/ACT inhaler 2 puff (has no administration in time range)  magnesium sulfate IVPB 2 g 50 mL (has no administration in time range)  ipratropium-albuterol (DUONEB) 0.5-2.5 (3) MG/3ML nebulizer solution 3 mL (has no administration in time range)    ED Course/ Medical Decision Making/ A&P   {   Click here for ABCD2, HEART and other calculatorsREFRESH Note before signing :1}  Medical Decision Making Amount and/or Complexity of Data Reviewed Labs: ordered. Radiology: ordered.  Risk Prescription drug management.   This patient complains of ***; this involves an extensive number of treatment Options and is a complaint that carries with it a high risk of complications and morbidity. The differential includes ***  I ordered, reviewed and interpreted labs, which included *** I ordered medication *** and reviewed PMP when indicated. I ordered imaging studies which included *** and I independently    visualized and interpreted imaging which showed *** Additional history obtained from *** Previous records obtained and reviewed *** I consulted *** and discussed lab and imaging findings and discussed disposition.  Cardiac monitoring reviewed, *** Social determinants considered, *** Critical Interventions: ***  After the interventions stated above, I reevaluated the patient and found *** Admission and further testing considered, ***   {Document critical care time when appropriate:1} {Document review of labs and clinical decision tools ie heart score, Chads2Vasc2 etc:1}  {Document your independent review of radiology images, and any outside records:1} {Document your discussion with family members, caretakers, and with consultants:1} {Document social determinants of health affecting pt's care:1} {Document your decision making why or why not admission, treatments  were needed:1} Final Clinical Impression(s) / ED Diagnoses Final diagnoses:  None    Rx / DC Orders ED Discharge Orders     None

## 2022-12-18 NOTE — Telephone Encounter (Signed)
Pt currently at ED  

## 2022-12-18 NOTE — Telephone Encounter (Signed)
Pt called back wanting to be seen today. Advised we have no openings and he stated he would go tot he ER.

## 2022-12-18 NOTE — Discharge Instructions (Signed)
You were seen in the emergency department for shortness of breath.  Your workup showed that you had some fluid on your lungs and this could be related to your heart.  I have given you prescription of Lasix which is a water pill and you should take this daily for at least the next week or until your symptoms improved.  You can follow-up with your cardiologist to have your symptoms rechecked and you may need to have an echo or ultrasound to look at your heart function.  You should return to the emergency department if you are having worsening shortness of breath despite the Lasix, you are having to increase the amount of oxygen that you are using, you have severe chest pain or if you have any other new or concerning symptoms.

## 2022-12-18 NOTE — Telephone Encounter (Signed)
Noted  

## 2022-12-18 NOTE — ED Provider Notes (Signed)
Patient signed out to me at 1500 by Dr. Melina Copa pending labs and reassessment.  In short this is an 87 year old male with a past medical history of COPD on 3 L nasal cannula as needed, CAD, hypertension and diabetes presenting to the emergency department with shortness of breath.  The patient has had increasing need of using his oxygen with shortness of breath on exertion.  The patient an EKG on arrival that had no acute ischemic changes.  The patient's labs showed negative troponin, mildly elevated BNP and evidence of some pulmonary edema on the chest x-ray.  He received an albuterol neb and magnesium prior to time of signout.  The patient has no wheezing on exam and lungs are clear to auscultation bilaterally.  He has no significant volume overload on exam.  He is satting well on his home nasal cannula.  The patient will be given a dose of IV Lasix and a prescription for Lasix and was recommended close cardiology follow-up.   Leanord Asal K, DO 12/18/22 1736

## 2022-12-21 ENCOUNTER — Telehealth: Payer: Self-pay

## 2022-12-21 NOTE — Telephone Encounter (Signed)
        Patient  visited [facility] on 4/2     Telephone encounter attempt :  1st Patient declined call   Lenard Forth [facility] Guide, [facility] [phone] [address] [place], [place], [place] [address] Phone: [phone] Email: [email or web address]

## 2022-12-26 ENCOUNTER — Telehealth: Payer: Self-pay | Admitting: Internal Medicine

## 2022-12-26 NOTE — Telephone Encounter (Signed)
Contacted [name]. to schedule their annual wellness visit. Appointment made for 01/04/2023.  Verlee Rossetti; Care Guide Ambulatory Clinical Support [place] [facility] Direct Dial: [phone]

## 2023-01-01 ENCOUNTER — Other Ambulatory Visit: Payer: Self-pay

## 2023-01-01 MED ORDER — SURE COMFORT PEN NEEDLES 31G X 6 MM MISC: 3 refills | Status: DC

## 2023-01-09 ENCOUNTER — Other Ambulatory Visit: Payer: Self-pay

## 2023-01-09 MED ORDER — SURE COMFORT PEN NEEDLES 31G X 6 MM MISC: 3 refills | Status: DC

## 2023-01-23 ENCOUNTER — Other Ambulatory Visit (INDEPENDENT_AMBULATORY_CARE_PROVIDER_SITE_OTHER): Payer: Medicare Other

## 2023-01-23 ENCOUNTER — Other Ambulatory Visit: Payer: Self-pay | Admitting: Endocrinology

## 2023-01-23 DIAGNOSIS — E1165 Type 2 diabetes mellitus with hyperglycemia: Secondary | ICD-10-CM | POA: Diagnosis not present

## 2023-01-31 ENCOUNTER — Other Ambulatory Visit: Payer: Medicare Other

## 2023-02-04 ENCOUNTER — Other Ambulatory Visit: Payer: Medicare Other

## 2023-02-04 NOTE — Progress Notes (Unsigned)
=         Patient ID: Jonathan [name]., male   DOB: 12/13/1935, 87 y.o.   MRN: [id number]    Reason for Appointment:  Follow-up of various problems  History of Present Illness:          Diagnosis: Type 2 diabetes mellitus, date of diagnosis:  2009      Past history: He is not clear when his diabetes was diagnosed but according to hospital records it may have been in 2009. Most likely he was placed on metformin initially and at some point Amaryl added. In 2013 it was also given Januvia presumably to improve his control. However appears that his A1c has been consistently over 7% Janumet was started instead of metformin and Januvia separately in 2013  He was started on Trulicity in 6/15 instead of Victoza because excessive bruising on his abdomen  Later in 01/2015 he was switched to Bydureon because of insurance noncoverage of his Trulicity  Recent history:   Non-insulin hypoglycemic drugs are: Metformin 500 mg a.m. and 1000 9 PM, Trulicity 3.0  mg weekly, Jardiance 25 mg, half daily  His A1c is 7.6 in April Here 6.2 today by fingerstick  Fructosamine last 319   Current blood sugar patterns and problems: His A1c is lower than expected from his recent blood sugar average of 160 and may not be accurate He is now using half a tablet of Jardiance 25 mg and is getting the prescription from the [place] pharmacy With his meter showing the wrong time difficult to analyze his blood sugar He has sporadic high readings both morning and evening Some of these may be related to eating sweets Otherwise he thinks he is trying to eat healthy diet  Still not doing much formal exercise but just busy with household activities and because of fatigue Diet is generally well-balanced without excess snacking Has taken Trulicity every week regularly Weight is about the same      Side effects from medications have been: None  Glucose monitoring:  done usually once a day       Glucometer:  FreeStyle  Blood  Glucose readings from monitor review   PRE-MEAL Fasting Lunch Dinner Bedtime Overall  Glucose range: 111-209      Mean/median: 148    163/60 days   POST-MEAL PC Breakfast PC Lunch PC Dinner  Glucose range:   140-229  Mean/median:   168   Previously:  PRE-MEAL Mornings Lunch Evening Bedtime Overall  Glucose range: 115-152  73-168    Mean/median: 155  128  129       Meals: 3 meals per day. eating egg/meat bread for breakfast, dinner 6-7 PM     Dietician visit: Most recent: 4/16 .               Weight history:  Wt Readings from Last 3 Encounters:  02/05/23 226 lb 12.8 oz (102.9 kg)  12/18/22 225 lb (102.1 kg)  11/29/22 229 lb 4 oz (104 kg)   Glycemic control:   Lab Results  Component Value Date   HGBA1C 6.2 (A) 02/05/2023   HGBA1C 7.4 (H) 10/30/2022   HGBA1C 7.0 (H) 07/13/2022   Lab Results  Component Value Date   MICROALBUR 6.4 (H) 04/09/2022   LDLCALC 65 01/12/2022   CREATININE 1.20 12/18/2022    Lab Results  Component Value Date   FRUCTOSAMINE 319 (H) 12/28/2021   FRUCTOSAMINE 288 (H) 05/24/2020   FRUCTOSAMINE 284 08/26/2019   Lab Results  Component Value Date   HGB 16.3 12/18/2022    OTHER active problems addressed today: See review of systems     Allergies as of 02/05/2023   No Known Allergies      Medication List        Accurate as of Feb 05, 2023  9:02 AM. If you have any questions, ask your nurse or doctor.          albuterol 108 (90 Base) MCG/ACT inhaler Commonly known as: VENTOLIN HFA USE 1 TO 2 INHALATIONS EVERY 6 HOURS AS NEEDED FOR WHEEZING OR SHORTNESS OF BREATH   amLODipine 5 MG tablet Commonly known as: NORVASC Take 1 tablet (5 mg total) by mouth daily.   aspirin 81 MG tablet Take 81 mg by mouth daily.   clopidogrel 75 MG tablet Commonly known as: PLAVIX Take 1 tablet (75 mg total) by mouth daily with breakfast.   empagliflozin 10 MG Tabs tablet Commonly known as: JARDIANCE Take 1 tablet (10 mg total) by mouth  daily.   FreeStyle Freedom Lite w/Device Kit Use Freestyle Freedom Lite meter to check blood sugar twice daily. DX:E11.65   FREESTYLE LITE test strip Generic drug: glucose blood USE AS INSTRUCTED   furosemide 20 MG tablet Commonly known as: LASIX Take 1 tablet (20 mg total) by mouth daily for 14 days.   gabapentin 100 MG capsule Commonly known as: NEURONTIN TAKE TWO CAPSULES BY MOUTH TWICE A DAY FOR NEUROPATHY   hydrOXYzine 25 MG tablet Commonly known as: ATARAX Take 0.5-1 tablets (12.5-25 mg total) by mouth every 8 (eight) hours as needed for anxiety.   metFORMIN 1000 MG tablet Commonly known as: GLUCOPHAGE Take 0.5 tablet in am and 1 whole one at dinner.   metoprolol succinate 100 MG 24 hr tablet Commonly known as: Toprol XL Take 1 tablet (100 mg total) by mouth daily.   multivitamin with minerals Tabs tablet Take 1 tablet by mouth daily.   ondansetron 4 MG tablet Commonly known as: ZOFRAN Take 1 tablet (4 mg total) by mouth every 8 (eight) hours as needed for nausea or vomiting.   OXYGEN Inhale 2 L/min into the lungs at bedtime. PRN during day   Repatha SureClick 140 MG/ML Soaj Generic drug: Evolocumab Inject 140 mg into the skin every 14 (fourteen) days.   simvastatin 20 MG tablet Commonly known as: ZOCOR Take 20 mg by mouth daily.   Sure Comfort Pen Needles 31G X 6 MM Misc Generic drug: Insulin Pen Needle Use as directed   tamsulosin 0.4 MG Caps capsule Commonly known as: FLOMAX Take 1 capsule (0.4 mg total) by mouth daily after supper.   Trulicity 3 MG/0.5ML Sopn Generic drug: Dulaglutide INJECT 3 MG UNDER THE SKIN ONCE A WEEK        Allergies: No Known Allergies  Past Medical History:  Diagnosis Date   CAD (coronary artery disease)    Two RCA stents remotely / 3rd RCA stent 2006   Colon polyps    s/p several Cscopes.   COPD (chronic obstructive pulmonary disease) (HCC)    on O2, nocturnal   Diabetes mellitus    dx aprox 2009   ED  (erectile dysfunction)    has a vacumm device   Ejection fraction    Hyperlipidemia    dx in 90s   Hypertension    dx in the 13   Hypogonadism male    PVD (peripheral vascular disease) (HCC)    s/p stents at [facility] 2009, Dr Jacinto Halim  Shortness of breath    O2 Sat dropped to 82% walking on the treadmill, September, 2012    Past Surgical History:  Procedure Laterality Date   APPENDECTOMY     CORONARY STENT INTERVENTION N/A 01/12/2022   Procedure: CORONARY STENT INTERVENTION;  Surgeon: [place], Peter M, MD;  Location: [facility] INVASIVE CV LAB;  Service: Cardiovascular;  Laterality: N/A;   RIGHT/LEFT HEART CATH AND CORONARY ANGIOGRAPHY N/A 01/11/2022   Procedure: RIGHT/LEFT HEART CATH AND CORONARY ANGIOGRAPHY;  Surgeon: Lyn Records, MD;  Location: [facility] INVASIVE CV LAB;  Service: Cardiovascular;  Laterality: N/A;   TONSILLECTOMY      Family History  Problem Relation Age of Onset   Heart disease Father    Hypertension Father    Stroke Father    Diabetes Paternal Aunt    Diabetes Maternal Grandmother    Diabetes Other        GM, nephews, many family members   Hyperlipidemia Other        ?   Prostate cancer Brother    Colon cancer Neg Hx     Social History:  reports that he quit smoking about 45 years ago. His smoking use included cigarettes. He has a 60.00 pack-year smoking history. He has never used smokeless tobacco. He reports current alcohol use. He reports that he does not use drugs.    Review of Systems        Lipids: He has been on treatment with simvastatin 20 mg from his PCP below with good control as follows       Lab Results  Component Value Date   CHOL 117 01/12/2022   CHOL 127 06/27/2021   CHOL 141 02/14/2021   Lab Results  Component Value Date   HDL 36 (L) 01/12/2022   HDL 42.30 06/27/2021   HDL 38.30 (L) 02/14/2021   Lab Results  Component Value Date   LDLCALC 65 01/12/2022   LDLCALC 63 06/27/2021   LDLCALC 64 02/14/2021   Lab Results  Component Value Date    TRIG 81 01/12/2022   TRIG 107.0 06/27/2021   TRIG 195.0 (H) 02/14/2021   Lab Results  Component Value Date   CHOLHDL 3.3 01/12/2022   CHOLHDL 3 06/27/2021   CHOLHDL 4 02/14/2021   Lab Results  Component Value Date   LDLDIRECT 55.0 01/01/2018                HYPOGONADISM He had decreased libido and erectile dysfunction previously and was found to have hypogonadism, probably in 2011. He did have a slightly low free testosterone level in 2012 on treatment Prolactin level normal  Previously on AndroGel With his hemoglobin going up to 19.1 in August 2019 he has been told not to take any testosterone He was given clomiphene as a trial but he did not try this  Testosterone level is persistently low without testosterone therapy  He now complains of fatigue   Lab Results  Component Value Date   TESTOSTERONE 256.52 (L) 12/28/2021   TESTOSTERONE 245.96 (L) 10/24/2021   TESTOSTERONE 215.23 (L) 05/24/2020    He has been followed by hematologist for erythrocytosis which is improved     Latest Ref Rng & Units 12/18/2022    2:54 PM 06/04/2022    9:39 AM 01/29/2022    6:32 PM  CBC  WBC 4.0 - 10.5 K/uL 7.9  9.8  7.4   Hemoglobin 13.0 - 17.0 g/dL 40.9  81.1  91.4   Hematocrit 39.0 - 52.0 % 48.1  50.6  56.0   Platelets 150 - 400 K/uL 252  247.0  229         HYPERTENSION: This is followed by PCP and is on  amlodipine 5 mg and metoprolol   He is checking at home also  BP Readings from Last 3 Encounters:  02/05/23 138/80  12/18/22 (!) 158/88  11/29/22 136/88   RENAL dysfunction: His creatinine is stable   Lab Results  Component Value Date   CREATININE 1.20 12/18/2022   CREATININE 1.24 10/30/2022   CREATININE 1.19 08/07/2022   NEUROPATHY: He has had persistent sensory loss, also has tingling in his distal feet   Physical Examination:  BP 138/80   Pulse 84   Ht 6\' 2"  (1.88 m)   Wt 226 lb 12.8 oz (102.9 kg)   SpO2 93%   BMI 29.12 kg/m       ASSESSMENT/PLAN:    Diabetes type 2, with mild obesity, non-insulin-dependent  His A1c is 6.2 but last month at 7.6 A1c may not be accurate today and needs fructosamine  Currently on 3 mg Trulicity, 12.5 mg Jardiance and Metformin 1500 mg daily  As above his blood sugars are somewhat variable and in the last 2 months averaging 160 at home with some readings being checked after dinner Diet may be inconsistent Currently not able to get 4.5 mg Trulicity through his mail order supply The time was today corrected on the home monitor  However her overall control is reasonable for his age  HYPERTENSION: Blood pressure is normal and he will also continue to monitor at home  RENAL dysfunction: Again is stable  Hypogonadism: May be symptomatic and complaining of fatigue Will need to recheck his level along with thyroid levels and will reschedule, he is not fasting today  Follow-up in 3 months  There are no Patient Instructions on file for this visit.    Reather Littler 02/05/2023, 9:02 AM   Note: This office note was prepared with Dragon voice recognition system technology. Any transcriptional errors that result from this process are unintentional.

## 2023-02-05 ENCOUNTER — Ambulatory Visit (INDEPENDENT_AMBULATORY_CARE_PROVIDER_SITE_OTHER): Payer: Medicare Other | Admitting: Endocrinology

## 2023-02-05 VITALS — BP 138/80 | HR 84 | Ht 74.0 in | Wt 226.8 lb

## 2023-02-05 DIAGNOSIS — E1129 Type 2 diabetes mellitus with other diabetic kidney complication: Secondary | ICD-10-CM

## 2023-02-05 DIAGNOSIS — E1165 Type 2 diabetes mellitus with hyperglycemia: Secondary | ICD-10-CM | POA: Diagnosis not present

## 2023-02-05 DIAGNOSIS — R809 Proteinuria, unspecified: Secondary | ICD-10-CM

## 2023-02-05 DIAGNOSIS — Z7985 Long-term (current) use of injectable non-insulin antidiabetic drugs: Secondary | ICD-10-CM

## 2023-02-05 DIAGNOSIS — E291 Testicular hypofunction: Secondary | ICD-10-CM | POA: Diagnosis not present

## 2023-02-05 DIAGNOSIS — Z7984 Long term (current) use of oral hypoglycemic drugs: Secondary | ICD-10-CM | POA: Diagnosis not present

## 2023-02-05 LAB — POCT GLYCOSYLATED HEMOGLOBIN (HGB A1C): Hemoglobin A1C: 6.2 % — AB (ref 4.0–5.6)

## 2023-02-05 MED ORDER — EMPAGLIFLOZIN 25 MG PO TABS: 25.0000 mg | ORAL_TABLET | Freq: Every day | ORAL | 2 refills | Status: DC

## 2023-02-21 ENCOUNTER — Emergency Department (HOSPITAL_COMMUNITY): Payer: Medicare Other

## 2023-02-21 ENCOUNTER — Other Ambulatory Visit: Payer: Self-pay

## 2023-02-21 ENCOUNTER — Observation Stay (HOSPITAL_COMMUNITY)
Admission: EM | Admit: 2023-02-21 | Discharge: 2023-02-22 | Disposition: A | Discharge status: Home / Self Care | Payer: Medicare Other | Attending: Internal Medicine | Admitting: Internal Medicine

## 2023-02-21 ENCOUNTER — Inpatient Hospital Stay (HOSPITAL_BASED_OUTPATIENT_CLINIC_OR_DEPARTMENT_OTHER): Payer: Medicare Other

## 2023-02-21 ENCOUNTER — Encounter (HOSPITAL_COMMUNITY): Payer: Self-pay | Admitting: Internal Medicine

## 2023-02-21 DIAGNOSIS — E1159 Type 2 diabetes mellitus with other circulatory complications: Secondary | ICD-10-CM | POA: Diagnosis not present

## 2023-02-21 DIAGNOSIS — E1122 Type 2 diabetes mellitus with diabetic chronic kidney disease: Secondary | ICD-10-CM

## 2023-02-21 DIAGNOSIS — I739 Peripheral vascular disease, unspecified: Secondary | ICD-10-CM | POA: Diagnosis present

## 2023-02-21 DIAGNOSIS — I509 Heart failure, unspecified: Secondary | ICD-10-CM | POA: Diagnosis not present

## 2023-02-21 DIAGNOSIS — J9611 Chronic respiratory failure with hypoxia: Secondary | ICD-10-CM | POA: Diagnosis present

## 2023-02-21 DIAGNOSIS — I10 Essential (primary) hypertension: Secondary | ICD-10-CM | POA: Diagnosis not present

## 2023-02-21 DIAGNOSIS — J810 Acute pulmonary edema: Principal | ICD-10-CM

## 2023-02-21 DIAGNOSIS — I213 ST elevation (STEMI) myocardial infarction of unspecified site: Secondary | ICD-10-CM | POA: Diagnosis not present

## 2023-02-21 DIAGNOSIS — Z87891 Personal history of nicotine dependence: Secondary | ICD-10-CM | POA: Insufficient documentation

## 2023-02-21 DIAGNOSIS — E785 Hyperlipidemia, unspecified: Secondary | ICD-10-CM | POA: Diagnosis present

## 2023-02-21 DIAGNOSIS — E1151 Type 2 diabetes mellitus with diabetic peripheral angiopathy without gangrene: Secondary | ICD-10-CM | POA: Diagnosis not present

## 2023-02-21 DIAGNOSIS — J449 Chronic obstructive pulmonary disease, unspecified: Secondary | ICD-10-CM | POA: Insufficient documentation

## 2023-02-21 DIAGNOSIS — I5043 Acute on chronic combined systolic (congestive) and diastolic (congestive) heart failure: Secondary | ICD-10-CM

## 2023-02-21 DIAGNOSIS — Z955 Presence of coronary angioplasty implant and graft: Secondary | ICD-10-CM | POA: Diagnosis not present

## 2023-02-21 DIAGNOSIS — D751 Secondary polycythemia: Secondary | ICD-10-CM | POA: Diagnosis not present

## 2023-02-21 DIAGNOSIS — Z7982 Long term (current) use of aspirin: Secondary | ICD-10-CM | POA: Diagnosis not present

## 2023-02-21 DIAGNOSIS — R059 Cough, unspecified: Secondary | ICD-10-CM | POA: Diagnosis not present

## 2023-02-21 DIAGNOSIS — R0902 Hypoxemia: Secondary | ICD-10-CM | POA: Diagnosis not present

## 2023-02-21 DIAGNOSIS — Z79899 Other long term (current) drug therapy: Secondary | ICD-10-CM | POA: Diagnosis not present

## 2023-02-21 DIAGNOSIS — E782 Mixed hyperlipidemia: Secondary | ICD-10-CM | POA: Diagnosis not present

## 2023-02-21 DIAGNOSIS — I2510 Atherosclerotic heart disease of native coronary artery without angina pectoris: Secondary | ICD-10-CM | POA: Diagnosis present

## 2023-02-21 DIAGNOSIS — Z7984 Long term (current) use of oral hypoglycemic drugs: Secondary | ICD-10-CM | POA: Diagnosis not present

## 2023-02-21 DIAGNOSIS — R0602 Shortness of breath: Secondary | ICD-10-CM | POA: Diagnosis not present

## 2023-02-21 DIAGNOSIS — Z7902 Long term (current) use of antithrombotics/antiplatelets: Secondary | ICD-10-CM | POA: Insufficient documentation

## 2023-02-21 DIAGNOSIS — E119 Type 2 diabetes mellitus without complications: Secondary | ICD-10-CM

## 2023-02-21 DIAGNOSIS — I110 Hypertensive heart disease with heart failure: Secondary | ICD-10-CM | POA: Diagnosis not present

## 2023-02-21 DIAGNOSIS — Z7985 Long-term (current) use of injectable non-insulin antidiabetic drugs: Secondary | ICD-10-CM | POA: Diagnosis not present

## 2023-02-21 LAB — CBC WITH DIFFERENTIAL/PLATELET
Abs Immature Granulocytes: 0.02 10*3/uL (ref 0.00–0.07)
Basophils Absolute: 0.1 10*3/uL (ref 0.0–0.1)
Basophils Relative: 1 %
Eosinophils Absolute: 0.3 10*3/uL (ref 0.0–0.5)
Eosinophils Relative: 3 %
HCT: 49.3 % (ref 39.0–52.0)
Hemoglobin: 16.5 g/dL (ref 13.0–17.0)
Immature Granulocytes: 0 %
Lymphocytes Relative: 18 %
Lymphs Abs: 1.8 10*3/uL (ref 0.7–4.0)
MCH: 33.7 pg (ref 26.0–34.0)
MCHC: 33.5 g/dL (ref 30.0–36.0)
MCV: 100.6 fL — ABNORMAL HIGH (ref 80.0–100.0)
Monocytes Absolute: 0.8 10*3/uL (ref 0.1–1.0)
Monocytes Relative: 8 %
Neutro Abs: 6.8 10*3/uL (ref 1.7–7.7)
Neutrophils Relative %: 70 %
Platelets: 256 10*3/uL (ref 150–400)
RBC: 4.9 MIL/uL (ref 4.22–5.81)
RDW: 14.8 % (ref 11.5–15.5)
WBC: 9.8 10*3/uL (ref 4.0–10.5)
nRBC: 0 % (ref 0.0–0.2)

## 2023-02-21 LAB — COMPREHENSIVE METABOLIC PANEL
ALT: 30 U/L (ref 0–44)
AST: 37 U/L (ref 15–41)
Albumin: 3.3 g/dL — ABNORMAL LOW (ref 3.5–5.0)
Alkaline Phosphatase: 61 U/L (ref 38–126)
Anion gap: 12 (ref 5–15)
BUN: 15 mg/dL (ref 8–23)
CO2: 21 mmol/L — ABNORMAL LOW (ref 22–32)
Calcium: 8.8 mg/dL — ABNORMAL LOW (ref 8.9–10.3)
Chloride: 105 mmol/L (ref 98–111)
Creatinine, Ser: 1.2 mg/dL (ref 0.61–1.24)
GFR, Estimated: 60 mL/min — ABNORMAL LOW (ref >60)
Glucose, Bld: 124 mg/dL — ABNORMAL HIGH (ref 70–99)
Potassium: 3.8 mmol/L (ref 3.5–5.1)
Sodium: 138 mmol/L (ref 135–145)
Total Bilirubin: 0.7 mg/dL (ref 0.3–1.2)
Total Protein: 6.3 g/dL — ABNORMAL LOW (ref 6.5–8.1)

## 2023-02-21 LAB — ECHOCARDIOGRAM COMPLETE
Area-P 1/2: 4.68 cm2
Height: 74 in
P 1/2 time: 603 msec
S' Lateral: 4.5 cm
Weight: 3648 oz

## 2023-02-21 LAB — GLUCOSE, CAPILLARY
Glucose-Capillary: 96 mg/dL (ref 70–99)
Glucose-Capillary: 124 mg/dL — ABNORMAL HIGH (ref 70–99)
Glucose-Capillary: 139 mg/dL — ABNORMAL HIGH (ref 70–99)

## 2023-02-21 LAB — BRAIN NATRIURETIC PEPTIDE: B Natriuretic Peptide: 464.6 pg/mL — ABNORMAL HIGH (ref 0.0–100.0)

## 2023-02-21 LAB — TROPONIN I (HIGH SENSITIVITY): Troponin I (High Sensitivity): 18 ng/L — ABNORMAL HIGH (ref <18)

## 2023-02-21 MED ORDER — ENOXAPARIN SODIUM 40 MG/0.4ML IJ SOSY
40.0000 mg | PREFILLED_SYRINGE | INTRAMUSCULAR | Status: DC
  Administered 2023-02-21: 40 mg via SUBCUTANEOUS
  Filled 2023-02-21: qty 0.4

## 2023-02-21 MED ORDER — POTASSIUM CHLORIDE CRYS ER 20 MEQ PO TBCR
20.0000 meq | EXTENDED_RELEASE_TABLET | Freq: Two times a day (BID) | ORAL | Status: DC
  Administered 2023-02-21 – 2023-02-22 (×3): 20 meq via ORAL
  Filled 2023-02-21 (×3): qty 1

## 2023-02-21 MED ORDER — ENSURE ENLIVE PO LIQD
237.0000 mL | Freq: Two times a day (BID) | ORAL | Status: DC
  Administered 2023-02-22: 237 mL via ORAL

## 2023-02-21 MED ORDER — PERFLUTREN LIPID MICROSPHERE
1.0000 mL | INTRAVENOUS | Status: AC | PRN
  Administered 2023-02-21: 2 mL via INTRAVENOUS

## 2023-02-21 MED ORDER — FUROSEMIDE 40 MG PO TABS
40.0000 mg | ORAL_TABLET | Freq: Every day | ORAL | Status: DC
  Administered 2023-02-22: 40 mg via ORAL
  Filled 2023-02-21: qty 1

## 2023-02-21 MED ORDER — ACETAMINOPHEN 650 MG RE SUPP: 650.0000 mg | Freq: Four times a day (QID) | RECTAL | Status: DC | PRN

## 2023-02-21 MED ORDER — ONDANSETRON HCL 4 MG/2ML IJ SOLN: 4.0000 mg | Freq: Four times a day (QID) | INTRAMUSCULAR | Status: DC | PRN

## 2023-02-21 MED ORDER — ACETAMINOPHEN 325 MG PO TABS: 650.0000 mg | ORAL_TABLET | Freq: Four times a day (QID) | ORAL | Status: DC | PRN

## 2023-02-21 MED ORDER — METOPROLOL SUCCINATE ER 100 MG PO TB24
100.0000 mg | ORAL_TABLET | Freq: Every day | ORAL | Status: DC
  Administered 2023-02-21 – 2023-02-22 (×2): 100 mg via ORAL
  Filled 2023-02-21 (×2): qty 1

## 2023-02-21 MED ORDER — CLOPIDOGREL BISULFATE 75 MG PO TABS
75.0000 mg | ORAL_TABLET | Freq: Every day | ORAL | Status: DC
  Administered 2023-02-22: 75 mg via ORAL
  Filled 2023-02-21: qty 1

## 2023-02-21 MED ORDER — ONDANSETRON HCL 4 MG PO TABS: 4.0000 mg | ORAL_TABLET | Freq: Four times a day (QID) | ORAL | Status: DC | PRN

## 2023-02-21 MED ORDER — EMPAGLIFLOZIN 25 MG PO TABS
25.0000 mg | ORAL_TABLET | Freq: Every day | ORAL | Status: DC
  Administered 2023-02-22: 25 mg via ORAL
  Filled 2023-02-21: qty 1

## 2023-02-21 MED ORDER — AMLODIPINE BESYLATE 5 MG PO TABS
5.0000 mg | ORAL_TABLET | Freq: Every day | ORAL | Status: DC
  Administered 2023-02-21: 5 mg via ORAL
  Filled 2023-02-21: qty 1

## 2023-02-21 MED ORDER — SIMVASTATIN 20 MG PO TABS
20.0000 mg | ORAL_TABLET | Freq: Every day | ORAL | Status: DC
  Administered 2023-02-21 – 2023-02-22 (×2): 20 mg via ORAL
  Filled 2023-02-21 (×2): qty 1

## 2023-02-21 MED ORDER — FUROSEMIDE 10 MG/ML IJ SOLN
40.0000 mg | Freq: Two times a day (BID) | INTRAMUSCULAR | Status: AC
  Administered 2023-02-21: 40 mg via INTRAVENOUS
  Filled 2023-02-21: qty 4

## 2023-02-21 MED ORDER — SACUBITRIL-VALSARTAN 24-26 MG PO TABS
1.0000 | ORAL_TABLET | Freq: Two times a day (BID) | ORAL | Status: DC
  Administered 2023-02-22: 1 via ORAL
  Filled 2023-02-21: qty 1

## 2023-02-21 MED ORDER — FUROSEMIDE 10 MG/ML IJ SOLN
40.0000 mg | Freq: Once | INTRAMUSCULAR | Status: AC
  Administered 2023-02-21: 40 mg via INTRAVENOUS
  Filled 2023-02-21: qty 4

## 2023-02-21 MED ORDER — GABAPENTIN 100 MG PO CAPS
200.0000 mg | ORAL_CAPSULE | Freq: Two times a day (BID) | ORAL | Status: DC
  Administered 2023-02-21 – 2023-02-22 (×2): 200 mg via ORAL
  Filled 2023-02-21 (×2): qty 2

## 2023-02-21 MED ORDER — ASPIRIN 81 MG PO TBEC
81.0000 mg | DELAYED_RELEASE_TABLET | Freq: Every day | ORAL | Status: DC
  Administered 2023-02-21 – 2023-02-22 (×2): 81 mg via ORAL
  Filled 2023-02-21 (×4): qty 1

## 2023-02-21 MED ORDER — INSULIN ASPART 100 UNIT/ML IJ SOLN: 0.0000 [IU] | Freq: Every day | INTRAMUSCULAR | Status: DC

## 2023-02-21 MED ORDER — INSULIN ASPART 100 UNIT/ML IJ SOLN
0.0000 [IU] | Freq: Three times a day (TID) | INTRAMUSCULAR | Status: DC
  Administered 2023-02-21: 2 [IU] via SUBCUTANEOUS

## 2023-02-21 MED ORDER — TAMSULOSIN HCL 0.4 MG PO CAPS
0.4000 mg | ORAL_CAPSULE | Freq: Every day | ORAL | Status: DC
  Administered 2023-02-21: 0.4 mg via ORAL
  Filled 2023-02-21: qty 1

## 2023-02-21 NOTE — Assessment & Plan Note (Signed)
Not on inhalers at home.  Uses O2 at night and during day PRN

## 2023-02-21 NOTE — ED Triage Notes (Signed)
Pt arrives to ED c/o SOB while laying flat in bed approx 1 hour ago. Pt on 2L as needed and found with O2 sat of 72% on 2L per EMS. Pt was placed on non-rebreather with O2 sat of 96%. Pt c/o of new cough. Hx of COPD, MI

## 2023-02-21 NOTE — H&P (Addendum)
History and Physical    Patient: Jonathan [name]. ZOX:[id number] DOB: [birth year] DOA: 02/21/2023 DOS: the patient was seen and examined on 02/21/2023 PCP: Wanda Plump, MD  Patient coming from: Home  Chief Complaint:  Chief Complaint  Patient presents with   Shortness of Breath       HPI:  [name] is an 87 y.o. M with CAD s/p PCI x2 12/2021, PVD s/p L SFA stent 2009, HTN, COPD on noct O2, DM, and HLD who presented after waking with acute paroxysmal dyspnea.   At baseline, the patient can walk about 50 to 100 feet before he has to stop and rest.  He has had no progression of this shortness of breath recently, but has noticed worsening of his chronic orthopnea in the last day or 2.  Overnight woke up and this was severe, so called EMS.  EMS called who found his SpO2 72%.  In the ER, CXR showed CM and edema.  BNP elevated.  Started on Lasix and admitted.      Review of Systems  Respiratory:  Positive for shortness of breath (but chronic and at baseline).   Cardiovascular:  Positive for orthopnea and PND. Negative for chest pain, palpitations, claudication and leg swelling.  All other systems reviewed and are negative.    Past Medical History:  Diagnosis Date   CAD (coronary artery disease)    Two RCA stents remotely / 3rd RCA stent 2006   Colon polyps    s/p several Cscopes.   COPD (chronic obstructive pulmonary disease) (HCC)    on O2, nocturnal   Diabetes mellitus    dx aprox 2009   ED (erectile dysfunction)    has a vacumm device   Ejection fraction    Hyperlipidemia    dx in 90s   Hypertension    dx in the 65   Hypogonadism male    PVD (peripheral vascular disease) (HCC)    s/p stents at LE 2009, Dr Jacinto Halim   Shortness of breath    O2 Sat dropped to 82% walking on the treadmill, September, 2012   Past Surgical History:  Procedure Laterality Date   APPENDECTOMY     CORONARY STENT INTERVENTION N/A 01/12/2022   Procedure: CORONARY STENT INTERVENTION;   Surgeon: [place]az[place], Peter M, MD;  [facility] INVASIVE CV LAB;  Service: Cardiovascular;  Laterality: N/A;   RIGHT/LEFT HEART CATH AND CORONARY ANGIOGRAPHY N/A 01/11/2022   Procedure: RIGHT/LEFT HEART CATH AND CORONARY ANGIOGRAPHY;  Surgeon: Lyn Records, MD;  Location: [facility] INVASIVE CV LAB;  Service: Cardiovascular;  Laterality: N/A;   TONSILLECTOMY     Social History:  reports that he quit smoking about 45 years ago. His smoking use included cigarettes. He has a 60.00 pack-year smoking history. He has never used smokeless tobacco. He reports current alcohol use. He reports that he does not use drugs.  No Known Allergies  Family History  Problem Relation Age of Onset   Heart disease Father    Hypertension Father    Stroke Father    Diabetes Paternal Aunt    Diabetes Maternal Grandmother    Diabetes Other        GM, nephews, many family members   Hyperlipidemia Other        ?   Prostate cancer Brother    Colon cancer Neg Hx     Prior to Admission medications   Medication Sig Start Date End Date Taking? Authorizing Provider  albuterol (VENTOLIN HFA) 108 (90  Base) MCG/ACT inhaler USE 1 TO 2 INHALATIONS EVERY 6 HOURS AS NEEDED FOR WHEEZING OR SHORTNESS OF BREATH 09/28/22   Jetty Duhamel D, MD  amLODipine (NORVASC) 5 MG tablet Take 1 tablet (5 mg total) by mouth daily. 06/28/20   Wanda Plump, MD  aspirin 81 MG tablet Take 81 mg by mouth daily.    [provider]  Blood Glucose Monitoring Suppl (FREESTYLE FREEDOM LITE) w/Device KIT Use Freestyle Freedom Lite meter to check blood sugar twice daily. DX:E11.65 11/17/21   Reather Littler, MD  clopidogrel (PLAVIX) 75 MG tablet Take 1 tablet (75 mg total) by mouth daily with breakfast. 06/19/22   Wanda Plump, MD  Dulaglutide (TRULICITY) 3 MG/0.5ML SOPN INJECT 3 MG UNDER THE SKIN ONCE A WEEK 01/24/22   Reather Littler, MD  empagliflozin (JARDIANCE) 25 MG TABS tablet Take 1 tablet (25 mg total) by mouth daily before breakfast. For diabetes E11.65 and E  11.29 02/05/23   Reather Littler, MD  Evolocumab (REPATHA SURECLICK) 140 MG/ML SOAJ Inject 140 mg into the skin every 14 (fourteen) days. Patient not taking: Reported on 02/05/2023 12/03/22   Runell Gess, MD  furosemide (LASIX) 20 MG tablet Take 1 tablet (20 mg total) by mouth daily for 14 days. 12/18/22 01/01/23  Elayne Snare K, DO  gabapentin (NEURONTIN) 100 MG capsule TAKE TWO CAPSULES BY MOUTH TWICE A DAY FOR NEUROPATHY 01/02/23   [provider]  glucose blood (FREESTYLE LITE) test strip USE AS INSTRUCTED 11/25/22   Reather Littler, MD  hydrOXYzine (ATARAX) 25 MG tablet Take 0.5-1 tablets (12.5-25 mg total) by mouth every 8 (eight) hours as needed for anxiety. 12/17/22   Wanda Plump, MD  Insulin Pen Needle (SURE COMFORT PEN NEEDLES) 31G X 6 MM MISC Use as directed 01/09/23   Reather Littler, MD  metFORMIN (GLUCOPHAGE) 1000 MG tablet Take 0.5 tablet in am and 1 whole one at dinner. 04/16/22   Reather Littler, MD  metoprolol succinate (TOPROL XL) 100 MG 24 hr tablet Take 1 tablet (100 mg total) by mouth daily. 07/28/18   Wanda Plump, MD  Multiple Vitamin (MULTIVITAMIN WITH MINERALS) TABS tablet Take 1 tablet by mouth daily.    [provider]  ondansetron (ZOFRAN) 4 MG tablet Take 1 tablet (4 mg total) by mouth every 8 (eight) hours as needed for nausea or vomiting. 04/17/22   Wanda Plump, MD  OXYGEN Inhale 2 L/min into the lungs at bedtime. PRN during day    [provider]  simvastatin (ZOCOR) 20 MG tablet Take 20 mg by mouth daily.    [provider]  tamsulosin (FLOMAX) 0.4 MG CAPS capsule Take 1 capsule (0.4 mg total) by mouth daily after supper. 12/03/22   Wanda Plump, MD    Physical Exam: Vitals:   02/21/23 0945 02/21/23 1040 02/21/23 1047 02/21/23 1049  BP:   (!) 142/85   Pulse: 82 80 81 77  Resp: 17 (!) 21 16 15   Temp:      TempSrc:      SpO2: 90% 95% 93% 94%  Weight:      Height:       Elderly adult male, appears comfortable, lying in bed,  conversational Oropharynx moist, no oral lesions, conjunctival pink, lids and lashes normal.  No nasal deformity, discharge, or epistaxis. RRR, no murmurs, trace pretibial edema, no JVD Respiratory rate normal, lung sounds normal without rales or wheezes Abdomen soft without tenderness palpation or guarding, no ascites or distention  Attention normal, affect appropriate, judgment insight appear normal Face symmetric, speech fluent, 5/5 strength in all 4 extremities     Data Reviewed: Basic metabolic panel shows normal renal function and electrolytes CBC shows chronic polycythemia Troponin trace elevation BNP 460 Chest x-ray with edema and cardiomegaly, personally reviewed ECG personally reviewed, shows no ST changes    Assessment and Plan: * Acute on chronic combined systolic and diastolic CHF (congestive heart failure) (HCC) Last Echo in Apr 2023 showed normal EF and no DD, but with his ischemic HD probably developed some worsening cardiomyopathy since then.  EF now 40-45%, normal valves, grade I DD. Now with orthopnea, CM and edema on CXR and elevated BNP. -Furosemide 40 mg IV twice a day today, resume oral tomorrow -K supplement -Strict I/Os, daily weights, telemetry  -Daily monitoring renal function -Continue home metoprolol, Jardiance - Stop amlodipine - Start Entresto  Discussed with Cardiology, they have arranged after hospital follow up. - If BP normal and exercise tolerance tomorrow on Entresto normal, home tomorrow likely     COPD mixed type with chronic respiratory failure on 2L oxygen at night and with exertion  Not on inhalers at home.  Uses O2 at night and during day PRN  DMII (diabetes mellitus, type 2) (HCC) Glucose normal - Continue Jardiance - Start SS corrections - Hold Trulicity and metformin - Continue gabapentin    Essential hypertension    Polycythemia, secondary Stable relative to baseline, chronic.  Hyperlipidemia    PVD (peripheral  vascular disease) (HCC)    CAD (coronary artery disease) MI in 2009 status post tenting, repeat LHC/April had 2 more stents placed.  No recent chest pain. - Continue home aspirin, simvastatin, Plavix - Continue amlodipine, metoprolol, Jardiance  Myocardial injury No chest pain or ECG changes, ischemia ruled out.  Likely this is myocardial injury from CHF flare.  No further ischemic workup necessary.  Reviewed with Cardiology, doubt ACS.      Advance Care Planning: Full code, confirmed with patient  Consults: None  Family Communication: None present  Severity of Illness: The appropriate patient status for this patient is OBSERVATION. Observation status is judged to be reasonable and necessary in order to provide the required intensity of service to ensure the patient's safety. The patient's presenting symptoms, physical exam findings, and initial radiographic and laboratory data in the context of their medical condition is felt to place them at decreased risk for further clinical deterioration. Furthermore, it is anticipated that the patient will be medically stable for discharge from the hospital within 2 midnights of admission.   Author: Alberteen Sam, MD 02/21/2023 12:22 PM  For on call review [email or web address].

## 2023-02-21 NOTE — Assessment & Plan Note (Signed)
Glucose normal - Continue Jardiance - Start SS corrections - Hold Trulicity and metformin - Continue gabapentin

## 2023-02-21 NOTE — TOC Initial Note (Signed)
Transition of Care (TOC) - Initial/Assessment Note    Patient Details  Name: Jonathan [name]. MRN: [id number] Date of Birth: 1935/12/13  Transition of Care [facility]) CM/SW Contact:    Leone Haven, RN Phone Number: 02/21/2023, 4:29 PM  Clinical Narrative:                 From home with wife, indep, he has PCP, Dr. Drue Novel, and insurance on file.  He has a cane and a walker at home but rarely uses them.  He does not have any HH in place at this time. He wife and Kenney Houseman are his support system.  [facility].  Kenney Houseman will his niece will transport him home at dc.  Expected Discharge Plan: Home/Self Care Barriers to Discharge: Continued Medical Work up   Patient Goals and CMS Choice Patient states their goals for this hospitalization and ongoing recovery are:: return home          Expected Discharge Plan and Services In-house Referral: NA Discharge Planning Services: CM Consult Post Acute Care Choice: NA Living arrangements for the past 2 months: Single Family Home                 DME Arranged: N/A DME Agency: NA       HH Arranged: NA          Prior Living Arrangements/Services Living arrangements for the past 2 months: Single Family Home Lives with:: Spouse Patient language and need for interpreter reviewed:: Yes Do you feel safe going back to the place where you live?: Yes      Need for Family Participation in Patient Care: Yes (Comment) Care giver support system in place?: Yes (comment) Current home services: DME (walker , cane) Criminal Activity/Legal Involvement Pertinent to Current Situation/Hospitalization: No - Comment as needed  Activities of Daily Living      Permission Sought/Granted                  Emotional Assessment Appearance:: Appears stated age Attitude/Demeanor/Rapport: Engaged Affect (typically observed): Appropriate Orientation: : Oriented to Self, Oriented to  Place, Oriented to  Time, Oriented to Situation Alcohol / Substance Use: Not Applicable Psych Involvement: No (comment)  Admission diagnosis:  Acute pulmonary edema (HCC) [J81.0] Hypoxia [R09.02] Acute CHF (congestive heart failure) (HCC) [I50.9] CHF (congestive heart failure) (HCC) [I50.9] Patient Active Problem List   Diagnosis Date Noted   Acute on chronic combined systolic and diastolic CHF (congestive heart failure) (HCC) 02/21/2023   CHF (congestive heart failure) (HCC) 02/21/2023   Anxiety and depression 01/31/2022   Panic attacks 01/31/2022   Unstable angina (HCC) 01/11/2022   Chest pain with high risk of acute coronary syndrome    Polycythemia, secondary 08/18/2018   Chronic respiratory failure with hypoxia (HCC) 02/28/2017   Dyspnea on exertion 01/24/2017   PCP NOTES >>>>>>>>>>>>>>>>>>> 04/03/2016   Coronary artery disease involving native coronary artery of native heart without angina pectoris 01/23/2016   Cramp of both lower extremities 01/23/2016   Chronic kidney disease, stage II (mild) 02/28/2014   Onychomycosis 04/10/2013   Essential tremor 10/14/2012   Annual physical exam 12/11/2011   Vitamin D deficiency 12/11/2011   Back pain, chronic 06/18/2011   COPD mixed type with chronic respiratory failure on 2L oxygen at night and with exertion     CAD (coronary artery disease)    PVD (peripheral vascular disease) (HCC)    Headache 10/06/2010  Hypogonadism in male 06/21/2010   DMII (diabetes mellitus, type 2) (HCC) 03/17/2010   Hyperlipidemia 03/17/2010   Essential hypertension 03/17/2010   PCP:  Wanda Plump, MD Pharmacy:   [facility] - [place], [place] - [address] [place] [address] Phone: [phone] Fax: [phone]  [facility] - [place], [place] - [address] [place] [address] Phone: [phone] Fax: [phone]  [facility] - [place], [place] - [address] [place] [address] Phone: [phone] Fax: [phone]     Social Determinants of Health (SDOH) Social History: SDOH Screenings   Food Insecurity: No Food Insecurity (07/20/2021)  Transportation Needs: No Transportation Needs (07/20/2021)  Alcohol Screen: Low Risk  (07/20/2021)  Depression (PHQ2-9): Low Risk  (11/29/2022)  Financial Resource Strain: Low Risk  (07/20/2021)  Physical Activity: Inactive (07/20/2021)  Social Connections: Socially Integrated (07/20/2021)  Stress: No Stress Concern Present (07/20/2021)  Tobacco Use: Medium Risk (12/18/2022)   SDOH Interventions:     Readmission Risk Interventions     No data to display

## 2023-02-21 NOTE — ED Provider Notes (Signed)
[facility] 3E HF PCU Provider Note   CSN: [id number] Arrival date & time: 02/21/23  0248     History  Chief Complaint  Patient presents with   Shortness of Breath    Jonathan [name]. is a 87 y.o. male.  87 year old male who presents ER today with dyspnea.  Patient states that he has had worsening dyspnea on exertion but most specifically tonight he had significant orthopnea.  He wears oxygen as needed during the day and 2 L at night.  He is on 3 L now with lower sats.  Has had a dry cough.  No lower extremity swelling that is any worse than normal.  Has a history of coronary artery disease but no known history of heart failure.  No fever.   Shortness of Breath      Home Medications Prior to Admission medications   Medication Sig Start Date End Date Taking? Authorizing Provider  albuterol (VENTOLIN HFA) 108 (90 Base) MCG/ACT inhaler USE 1 TO 2 INHALATIONS EVERY 6 HOURS AS NEEDED FOR WHEEZING OR SHORTNESS OF BREATH 09/28/22   Young, Joni Fears D, MD  amLODipine (NORVASC) 5 MG tablet Take 1 tablet (5 mg total) by mouth daily. 06/28/20   Wanda Plump, MD  aspirin 81 MG tablet Take 81 mg by mouth daily.    [provider]  Blood Glucose Monitoring Suppl (FREESTYLE FREEDOM LITE) w/Device KIT Use Freestyle Freedom Lite meter to check blood sugar twice daily. DX:E11.65 11/17/21   Reather Littler, MD  clopidogrel (PLAVIX) 75 MG tablet Take 1 tablet (75 mg total) by mouth daily with breakfast. 06/19/22   Wanda Plump, MD  Dulaglutide (TRULICITY) 3 MG/0.5ML SOPN INJECT 3 MG UNDER THE SKIN ONCE A WEEK 01/24/22   Reather Littler, MD  empagliflozin (JARDIANCE) 25 MG TABS tablet Take 1 tablet (25 mg total) by mouth daily before breakfast. For diabetes E11.65 and E 11.29 02/05/23   Reather Littler, MD  Evolocumab (REPATHA SURECLICK) 140 MG/ML SOAJ Inject 140 mg into the skin every 14 (fourteen) days. Patient not taking: Reported on 02/05/2023 12/03/22   Runell Gess, MD  furosemide (LASIX) 20  MG tablet Take 1 tablet (20 mg total) by mouth daily for 14 days. 12/18/22 01/01/23  Elayne Snare K, DO  gabapentin (NEURONTIN) 100 MG capsule TAKE TWO CAPSULES BY MOUTH TWICE A DAY FOR NEUROPATHY 01/02/23   [provider]  glucose blood (FREESTYLE LITE) test strip USE AS INSTRUCTED 11/25/22   Reather Littler, MD  hydrOXYzine (ATARAX) 25 MG tablet Take 0.5-1 tablets (12.5-25 mg total) by mouth every 8 (eight) hours as needed for anxiety. 12/17/22   Wanda Plump, MD  Insulin Pen Needle (SURE COMFORT PEN NEEDLES) 31G X 6 MM MISC Use as directed 01/09/23   Reather Littler, MD  metFORMIN (GLUCOPHAGE) 1000 MG tablet Take 0.5 tablet in am and 1 whole one at dinner. 04/16/22   Reather Littler, MD  metoprolol succinate (TOPROL XL) 100 MG 24 hr tablet Take 1 tablet (100 mg total) by mouth daily. 07/28/18   Wanda Plump, MD  Multiple Vitamin (MULTIVITAMIN WITH MINERALS) TABS tablet Take 1 tablet by mouth daily.    [provider]  ondansetron (ZOFRAN) 4 MG tablet Take 1 tablet (4 mg total) by mouth every 8 (eight) hours as needed for nausea or vomiting. 04/17/22   Wanda Plump, MD  OXYGEN Inhale 2 L/min into the lungs at bedtime. PRN during day    [provider]  simvastatin (ZOCOR) 20 MG tablet Take 20 mg by mouth daily.    [provider]  tamsulosin (FLOMAX) 0.4 MG CAPS capsule Take 1 capsule (0.4 mg total) by mouth daily after supper. 12/03/22   Wanda Plump, MD      Allergies    Patient has no known allergies.    Review of Systems   Review of Systems  Respiratory:  Positive for shortness of breath.     Physical Exam Updated Vital Signs BP (!) 145/65 (BP Location: Right Arm)   Pulse 64   Temp 97.6 F (36.4 C) (Oral)   Resp 20   Ht 6\' 2"  (1.88 m)   Wt 98.2 kg   SpO2 (!) 87%   BMI 27.80 kg/m  Physical Exam Vitals and nursing note reviewed.  Constitutional:      Appearance: He is well-developed.  HENT:     Head: Normocephalic and atraumatic.  Cardiovascular:     Rate  and Rhythm: Normal rate.  Pulmonary:     Effort: Pulmonary effort is normal. No respiratory distress.     Breath sounds: Examination of the right-lower field reveals rales. Examination of the left-lower field reveals rales. Rales present.  Abdominal:     General: There is no distension.  Musculoskeletal:        General: Normal range of motion.     Cervical back: Normal range of motion.  Skin:    General: Skin is warm and dry.  Neurological:     Mental Status: He is alert.     ED Results / Procedures / Treatments   Labs (all labs ordered are listed, but only abnormal results are displayed) Labs Reviewed  CBC WITH DIFFERENTIAL/PLATELET - Abnormal; Notable for the following components:      Result Value   MCV 100.6 (*)    All other components within normal limits  COMPREHENSIVE METABOLIC PANEL - Abnormal; Notable for the following components:   CO2 21 (*)    Glucose, Bld 124 (*)    Calcium 8.8 (*)    Total Protein 6.3 (*)    Albumin 3.3 (*)    GFR, Estimated 60 (*)    All other components within normal limits  BRAIN NATRIURETIC PEPTIDE - Abnormal; Notable for the following components:   B Natriuretic Peptide 464.6 (*)    All other components within normal limits  GLUCOSE, CAPILLARY - Abnormal; Notable for the following components:   Glucose-Capillary 124 (*)    All other components within normal limits  GLUCOSE, CAPILLARY - Abnormal; Notable for the following components:   Glucose-Capillary 139 (*)    All other components within normal limits  TROPONIN I (HIGH SENSITIVITY) - Abnormal; Notable for the following components:   Troponin I (High Sensitivity) 18 (*)    All other components within normal limits  GLUCOSE, CAPILLARY  BASIC METABOLIC PANEL  CBC    EKG EKG Interpretation  Date/Time:  Thursday February 21 2023 02:57:16 EDT Ventricular Rate:  85 PR Interval:  253 QRS Duration: 87 QT Interval:  389 QTC Calculation: 463 R Axis:   12 Text Interpretation: Sinus  rhythm Prolonged PR interval Confirmed by Marily Memos [phone]) on 02/21/2023 3:13:53 AM  Radiology ECHOCARDIOGRAM COMPLETE  Result Date: 02/21/2023    ECHOCARDIOGRAM REPORT   Patient Name:   Ranny Hertenstein. Date of Exam: 02/21/2023 Medical Rec #:  [id number]           Height:       74.0 in Accession #:  [id number]          Weight:       228.0 lb Date of Birth:  03-01-1936            BSA:          2.298 m Patient Age:    86 years            BP:           130/78 mmHg Patient Gender: M                   HR:           82 bpm. Exam Location:  Inpatient Procedure: 2D Echo, Cardiac Doppler, Color Doppler and Intracardiac            Opacification Agent Indications:    CHF  History:        Patient has prior history of Echocardiogram examinations, most                 recent 01/11/2022. CAD, PAD; Risk Factors:Hypertension and                 Dyslipidemia.  Sonographer:    Melissa Morford RDCS (AE, PE) Referring Phys: [id number] CHRISTOPHER P DANFORD IMPRESSIONS  1. Global hypokinesis worse in the septum and inferior wall. Left ventricular ejection fraction, by estimation, is 40 to 45%. The left ventricle has mildly decreased function. The left ventricle demonstrates global hypokinesis. The left ventricular internal cavity size was moderately dilated. Left ventricular diastolic parameters are consistent with Grade I diastolic dysfunction (impaired relaxation).  2. Right ventricular systolic function is normal. The right ventricular size is normal.  3. Left atrial size was moderately dilated.  4. The mitral valve is abnormal. No evidence of mitral valve regurgitation. No evidence of mitral stenosis.  5. Partial fusion of non and right cusps. The aortic valve is tricuspid. There is mild calcification of the aortic valve. There is mild thickening of the aortic valve. Aortic valve regurgitation is mild. Aortic valve sclerosis is present, with no evidence of aortic valve stenosis.  6. The inferior vena cava is normal in size with  greater than 50% respiratory variability, suggesting right atrial pressure of 3 mmHg. FINDINGS  Left Ventricle: Global hypokinesis worse in the septum and inferior wall. Left ventricular ejection fraction, by estimation, is 40 to 45%. The left ventricle has mildly decreased function. The left ventricle demonstrates global hypokinesis. Definity contrast agent was given IV to delineate the left ventricular endocardial borders. The left ventricular internal cavity size was moderately dilated. There is no left ventricular hypertrophy. Left ventricular diastolic parameters are consistent with Grade  I diastolic dysfunction (impaired relaxation). Right Ventricle: The right ventricular size is normal. No increase in right ventricular wall thickness. Right ventricular systolic function is normal. Left Atrium: Left atrial size was moderately dilated. Right Atrium: Right atrial size was normal in size. Pericardium: There is no evidence of pericardial effusion. Mitral Valve: The mitral valve is abnormal. There is mild thickening of the mitral valve leaflet(s). There is mild calcification of the mitral valve leaflet(s). No evidence of mitral valve regurgitation. No evidence of mitral valve stenosis. Tricuspid Valve: The tricuspid valve is normal in structure. Tricuspid valve regurgitation is not demonstrated. No evidence of tricuspid stenosis. Aortic Valve: Partial fusion of non and right cusps. The aortic valve is tricuspid. There is mild calcification of the aortic valve. There is mild thickening of the aortic valve. Aortic valve regurgitation is mild.  Aortic regurgitation PHT measures 603 msec. Aortic valve sclerosis is present, with no evidence of aortic valve stenosis. Pulmonic Valve: The pulmonic valve was normal in structure. Pulmonic valve regurgitation is not visualized. No evidence of pulmonic stenosis. Aorta: The aortic root is normal in size and structure. Venous: The inferior vena cava is normal in size with  greater than 50% respiratory variability, suggesting right atrial pressure of 3 mmHg. IAS/Shunts: No atrial level shunt detected by color flow Doppler.  LEFT VENTRICLE PLAX 2D LVIDd:         5.10 cm   Diastology LVIDs:         4.50 cm   LV e' medial:    3.26 cm/s LV PW:         1.40 cm   LV E/e' medial:  14.3 LV IVS:        1.10 cm   LV e' lateral:   4.35 cm/s LVOT diam:     2.00 cm   LV E/e' lateral: 10.7 LV SV:         51 LV SV Index:   22 LVOT Area:     3.14 cm  RIGHT VENTRICLE RV S prime:     10.20 cm/s TAPSE (M-mode): 2.0 cm LEFT ATRIUM             Index        RIGHT ATRIUM           Index LA diam:        4.50 cm 1.96 cm/m   RA Area:     15.90 cm LA Vol (A2C):   45.3 ml 19.71 ml/m  RA Volume:   38.00 ml  16.54 ml/m LA Vol (A4C):   56.8 ml 24.72 ml/m LA Biplane Vol: 53.2 ml 23.15 ml/m  AORTIC VALVE LVOT Vmax:   86.60 cm/s LVOT Vmean:  66.800 cm/s LVOT VTI:    0.162 m AI PHT:      603 msec  AORTA Ao Root diam: 3.60 cm Ao Asc diam:  3.70 cm MITRAL VALVE MV Area (PHT): 4.68 cm    SHUNTS MV Decel Time: 162 msec    Systemic VTI:  0.16 m MV E velocity: 46.70 cm/s  Systemic Diam: 2.00 cm MV A velocity: 94.70 cm/s MV E/A ratio:  0.49 Charlton Haws MD Electronically signed by Charlton Haws MD Signature Date/Time: 02/21/2023/11:54:30 AM    Final    DG Chest 2 View  Result Date: 02/21/2023 CLINICAL DATA:  Shortness of breath EXAM: CHEST - 2 VIEW COMPARISON:  12/18/2022 FINDINGS: Cardiac shadow is enlarged but stable. The lungs are well aerated bilaterally. Mild persistent interstitial density is seen without focal confluent infiltrate. No sizable effusion is seen. No bony abnormality is noted. IMPRESSION: Changes most consistent with interstitial edema. Electronically Signed   By: Alcide Clever M.D.   On: 02/21/2023 03:51    Procedures .Critical Care  Performed by: Marily Memos, MD Authorized by: Marily Memos, MD   Critical care provider statement:    Critical care time (minutes):  30   Critical care was  necessary to treat or prevent imminent or life-threatening deterioration of the following conditions:  Respiratory failure   Critical care was time spent personally by me on the following activities:  Development of treatment plan with patient or surrogate, discussions with consultants, evaluation of patient's response to treatment, examination of patient, ordering and review of laboratory studies, ordering and review of radiographic studies, ordering and performing treatments and interventions, pulse oximetry,  re-evaluation of patient's condition and review of old charts     Medications Ordered in ED Medications  insulin aspart (novoLOG) injection 0-15 Units (2 Units Subcutaneous Given 02/21/23 1656)  insulin aspart (novoLOG) injection 0-5 Units ( Subcutaneous Not Given 02/21/23 2135)  aspirin EC tablet 81 mg (81 mg Oral Given 02/21/23 1316)  metoprolol succinate (TOPROL-XL) 24 hr tablet 100 mg (100 mg Oral Given 02/21/23 1316)  simvastatin (ZOCOR) tablet 20 mg (20 mg Oral Given 02/21/23 1316)  empagliflozin (JARDIANCE) tablet 25 mg (has no administration in time range)  tamsulosin (FLOMAX) capsule 0.4 mg (0.4 mg Oral Given 02/21/23 1813)  clopidogrel (PLAVIX) tablet 75 mg (has no administration in time range)  gabapentin (NEURONTIN) capsule 200 mg (200 mg Oral Given 02/21/23 2133)  enoxaparin (LOVENOX) injection 40 mg (40 mg Subcutaneous Given 02/21/23 2134)  potassium chloride SA (KLOR-CON M) CR tablet 20 mEq (20 mEq Oral Given 02/21/23 2133)  acetaminophen (TYLENOL) tablet 650 mg (has no administration in time range)    Or  acetaminophen (TYLENOL) suppository 650 mg (has no administration in time range)  ondansetron (ZOFRAN) tablet 4 mg (has no administration in time range)    Or  ondansetron (ZOFRAN) injection 4 mg (has no administration in time range)  perflutren lipid microspheres (DEFINITY) IV suspension (2 mLs Intravenous Given 02/21/23 1134)  sacubitril-valsartan (ENTRESTO) 24-26 mg per tablet (has no  administration in time range)  furosemide (LASIX) tablet 40 mg (has no administration in time range)  feeding supplement (ENSURE ENLIVE / ENSURE PLUS) liquid 237 mL (has no administration in time range)  furosemide (LASIX) injection 40 mg (40 mg Intravenous Given 02/21/23 0526)  furosemide (LASIX) injection 40 mg (40 mg Intravenous Given 02/21/23 1316)    ED Course/ Medical Decision Making/ A&P                             Medical Decision Making Amount and/or Complexity of Data Reviewed Labs: ordered. Radiology: ordered.  Risk Prescription drug management. Decision regarding hospitalization.   Hypoxic with rails and elevated BNP and interstitial edema on his x-ray consistent with likely pulmonary edema.  Lasix started.  This is a new diagnosis for him and he is hypoxic so we will go ahead and for further diuresis and workup.  Final Clinical Impression(s) / ED Diagnoses Final diagnoses:  Acute pulmonary edema (HCC)  Hypoxia    Rx / DC Orders ED Discharge Orders     None         Nekoda Chock, Barbara Cower, MD 02/21/23 2330

## 2023-02-21 NOTE — Assessment & Plan Note (Signed)
MI in 2009 status post tenting, repeat LHC/April had 2 more stents placed.  No recent chest pain. - Continue home aspirin, simvastatin, Plavix - Continue amlodipine, metoprolol, Jardiance

## 2023-02-21 NOTE — Care Management CC44 (Signed)
Condition Code 44 Documentation Completed  Patient Details  Name: Jonathan [name]. MRN: [id number] Date of Birth: [birth year]   Condition Code 44 given:  Yes Patient signature on Condition Code 44 notice:  Yes Documentation of 2 MD's agreement:  Yes Code 44 added to claim:  Yes    Leone Haven, RN 02/21/2023, 4:21 PM

## 2023-02-21 NOTE — Assessment & Plan Note (Addendum)
Last Echo in Apr 2023 showed normal EF and no DD, but with his ischemic HD probably developed some worsening cardiomyopathy since then.  EF now 40-45%, normal valves, grade I DD. Now with orthopnea, CM and edema on CXR and elevated BNP. -Furosemide 40 mg IV twice a day today, resume oral tomorrow -K supplement -Strict I/Os, daily weights, telemetry  -Daily monitoring renal function -Continue home metoprolol, Jardiance - Stop amlodipine - Start Entresto  Discussed with Cardiology, they have arranged after hospital follow up. - If BP normal and exercise tolerance tomorrow on Entresto normal, home tomorrow likely

## 2023-02-21 NOTE — Care Management Obs Status (Signed)
MEDICARE OBSERVATION STATUS NOTIFICATION   Patient Details  Name: Jonathan [name]. MRN: [id number] Date of Birth: 02-06-1936   Medicare Observation Status Notification Given:  Yes    Leone Haven, RN 02/21/2023, 4:21 PM

## 2023-02-21 NOTE — ED Notes (Signed)
ED TO INPATIENT HANDOFF REPORT  ED Nurse Name and Phone #: [phone], Lendon Ka Name/Age/Gender Jonathan [name] 87 y.o. male Room/Bed: 040C/040C  Code Status   Code Status: Prior  Home/SNF/Other Home Patient oriented to: self, place, time, and situation Is this baseline? Yes   Triage Complete: Triage complete  Chief Complaint Acute CHF (congestive heart failure) (HCC) [I50.9]  Triage Note Pt arrives to ED c/o SOB while laying flat in bed approx 1 hour ago. Pt on 2L as needed and found with O2 sat of 72% on 2L per EMS. Pt was placed on non-rebreather with O2 sat of 96%. Pt c/o of new cough. Hx of COPD, MI   Allergies No Known Allergies  Level of Care/Admitting Diagnosis ED Disposition     ED Disposition  Admit   Condition  --   Comment  [facility] [[id number]]  Level of Care: Telemetry Cardiac [103]  May admit patient to [facility] or [facility] if equivalent level of care is available:: Yes  Covid Evaluation: Asymptomatic - no recent exposure (last 10 days) testing not required  Diagnosis: Acute CHF (congestive heart failure) [facility]) [[id number]]  Admitting Physician: Briscoe Deutscher [[id number]]  Attending Physician: Briscoe Deutscher [[id number]]  Certification:: I certify this patient will need inpatient services for at least 2 midnights  Estimated Length of Stay: 3          B Medical/Surgery History Past Medical History:  Diagnosis Date   CAD (coronary artery disease)    Two RCA stents remotely / 3rd RCA stent 2006   Colon polyps    s/p several Cscopes.   COPD (chronic obstructive pulmonary disease) (HCC)    on O2, nocturnal   Diabetes mellitus    dx aprox 2009   ED (erectile dysfunction)    has a vacumm device   Ejection fraction    Hyperlipidemia    dx in 90s   Hypertension    dx in the 43   Hypogonadism male    PVD (peripheral vascular disease) (HCC)    s/p stents at LE 2009, Dr Jacinto Halim   Shortness of breath    O2  Sat dropped to 82% walking on the treadmill, September, 2012   Past Surgical History:  Procedure Laterality Date   APPENDECTOMY     CORONARY STENT INTERVENTION N/A 01/12/2022   Procedure: CORONARY STENT INTERVENTION;  Surgeon: [place], Peter M, MD;  Location: [facility] INVASIVE CV LAB;  Service: Cardiovascular;  Laterality: N/A;   RIGHT/LEFT HEART CATH AND CORONARY ANGIOGRAPHY N/A 01/11/2022   Procedure: RIGHT/LEFT HEART CATH AND CORONARY ANGIOGRAPHY;  Surgeon: Lyn Records, MD;  Location: [facility] INVASIVE CV LAB;  Service: Cardiovascular;  Laterality: N/A;   TONSILLECTOMY       A IV Location/Drains/Wounds Patient Lines/Drains/Airways Status     Active Line/Drains/Airways     Name Placement date Placement time Site Days   Peripheral IV 02/21/23 20 G 1" Anterior;Distal;Left Forearm 02/21/23  0256  Forearm  less than 1            Intake/Output Last 24 hours No intake or output data in the 24 hours ending 02/21/23 1003  Labs/Imaging Results for orders placed or performed during the hospital encounter of 02/21/23 (from the past 48 hour(s))  CBC with Differential     Status: Abnormal   Collection Time: 02/21/23  3:22 AM  Result Value Ref Range   WBC 9.8 4.0 - 10.5 K/uL  RBC 4.90 4.22 - 5.81 MIL/uL   Hemoglobin 16.5 13.0 - 17.0 g/dL   HCT 16.1 09.6 - 04.5 %   MCV 100.6 (H) 80.0 - 100.0 fL   MCH 33.7 26.0 - 34.0 pg   MCHC 33.5 30.0 - 36.0 g/dL   RDW 40.9 81.1 - 91.4 %   Platelets 256 150 - 400 K/uL   nRBC 0.0 0.0 - 0.2 %   Neutrophils Relative % 70 %   Neutro Abs 6.8 1.7 - 7.7 K/uL   Lymphocytes Relative 18 %   Lymphs Abs 1.8 0.7 - 4.0 K/uL   Monocytes Relative 8 %   Monocytes Absolute 0.8 0.1 - 1.0 K/uL   Eosinophils Relative 3 %   Eosinophils Absolute 0.3 0.0 - 0.5 K/uL   Basophils Relative 1 %   Basophils Absolute 0.1 0.0 - 0.1 K/uL   Immature Granulocytes 0 %   Abs Immature Granulocytes 0.02 0.00 - 0.07 K/uL    Comment: Performed at [facility], [address],  [place], [place] [address]  Comprehensive metabolic panel     Status: Abnormal   Collection Time: 02/21/23  3:22 AM  Result Value Ref Range   Sodium 138 135 - 145 mmol/L   Potassium 3.8 3.5 - 5.1 mmol/L   Chloride 105 98 - 111 mmol/L   CO2 21 (L) 22 - 32 mmol/L   Glucose, Bld 124 (H) 70 - 99 mg/dL    Comment: Glucose reference range applies only to samples taken after fasting for at least 8 hours.   BUN 15 8 - 23 mg/dL   Creatinine, Ser 6.21 0.61 - 1.24 mg/dL   Calcium 8.8 (L) 8.9 - 10.3 mg/dL   Total Protein 6.3 (L) 6.5 - 8.1 g/dL   Albumin 3.3 (L) 3.5 - 5.0 g/dL   AST 37 15 - 41 U/L   ALT 30 0 - 44 U/L   Alkaline Phosphatase 61 38 - 126 U/L   Total Bilirubin 0.7 0.3 - 1.2 mg/dL   GFR, Estimated 60 (L) >60 mL/min    Comment: (NOTE) Calculated using the CKD-EPI Creatinine Equation (2021)    Anion gap 12 5 - 15    Comment: Performed at [facility], [address], [place], [place] [address]  Troponin I (High Sensitivity)     Status: Abnormal   Collection Time: 02/21/23  3:22 AM  Result Value Ref Range   Troponin I (High Sensitivity) 18 (H) <18 ng/L    Comment: (NOTE) Elevated high sensitivity troponin I (hsTnI) values and significant  changes across serial measurements may suggest ACS but many other  chronic and acute conditions are known to elevate hsTnI results.  Refer to the "Links" section for chest pain algorithms and additional  guidance. Performed at [facility], [address] [place], [place] [address]   Brain natriuretic peptide     Status: Abnormal   Collection Time: 02/21/23  3:38 AM  Result Value Ref Range   B Natriuretic Peptide 464.6 (H) 0.0 - 100.0 pg/mL    Comment: Performed at [facility], [address], [place], [place] [address]   DG Chest 2 View  Result Date: 02/21/2023 CLINICAL DATA:  Shortness of breath EXAM: CHEST - 2 VIEW COMPARISON:  12/18/2022 FINDINGS: Cardiac shadow is enlarged but stable. The lungs are well aerated  bilaterally. Mild persistent interstitial density is seen without focal confluent infiltrate. No sizable effusion is seen. No bony abnormality is noted. IMPRESSION: Changes  most consistent with interstitial edema. Electronically Signed   By: Alcide Clever M.D.   On: 02/21/2023 03:51    Pending Labs Unresulted Labs (From admission, onward)    None       Vitals/Pain Today's Vitals   02/21/23 0545 02/21/23 0600 02/21/23 0615 02/21/23 0935  BP: (!) 148/78 (!) 141/91 (!) 168/90   Pulse: 64 77 99   Resp: 12 20 20    Temp:    98 F (36.7 C)  TempSrc:    Oral  SpO2: 95% 98% 95%   Weight:      Height:      PainSc:        Isolation Precautions No active isolations  Medications Medications  insulin aspart (novoLOG) injection 0-15 Units (has no administration in time range)  insulin aspart (novoLOG) injection 0-5 Units (has no administration in time range)  furosemide (LASIX) injection 40 mg (has no administration in time range)  furosemide (LASIX) injection 40 mg (40 mg Intravenous Given 02/21/23 0526)    Mobility walks with person assist     Focused Assessments    R Recommendations: See Admitting Provider Note  Report given to:   Additional Notes:

## 2023-02-21 NOTE — Plan of Care (Signed)
  Problem: Education: Goal: Ability to demonstrate management of disease process will improve Outcome: Progressing Goal: Ability to verbalize understanding of medication therapies will improve Outcome: Progressing   

## 2023-02-21 NOTE — Hospital Course (Signed)
Jonathan [name] is an 87 y.o. M with CAD s/p PCI x2 12/2021, PVD s/p L SFA stent 2009, HTN, COPD on noct O2, DM, and HLD who presented after waking with acute paroxysmal dyspnea.   EMS called who found his SpO2 72%.  In the ER, CXR showed CM and edema.  BNP elevated.  Started on Lasix and admitted.

## 2023-02-21 NOTE — Assessment & Plan Note (Signed)
Stable relative to baseline, chronic.

## 2023-02-22 ENCOUNTER — Other Ambulatory Visit (HOSPITAL_COMMUNITY): Payer: Self-pay

## 2023-02-22 DIAGNOSIS — I5043 Acute on chronic combined systolic (congestive) and diastolic (congestive) heart failure: Secondary | ICD-10-CM

## 2023-02-22 LAB — CBC
HCT: 48.9 % (ref 39.0–52.0)
Hemoglobin: 17.1 g/dL — ABNORMAL HIGH (ref 13.0–17.0)
MCH: 34.1 pg — ABNORMAL HIGH (ref 26.0–34.0)
MCHC: 35 g/dL (ref 30.0–36.0)
MCV: 97.6 fL (ref 80.0–100.0)
Platelets: 207 10*3/uL (ref 150–400)
RBC: 5.01 MIL/uL (ref 4.22–5.81)
RDW: 14.6 % (ref 11.5–15.5)
WBC: 8.7 10*3/uL (ref 4.0–10.5)
nRBC: 0 % (ref 0.0–0.2)

## 2023-02-22 LAB — BASIC METABOLIC PANEL
Anion gap: 17 — ABNORMAL HIGH (ref 5–15)
BUN: 20 mg/dL (ref 8–23)
CO2: 22 mmol/L (ref 22–32)
Calcium: 9 mg/dL (ref 8.9–10.3)
Chloride: 99 mmol/L (ref 98–111)
Creatinine, Ser: 1.34 mg/dL — ABNORMAL HIGH (ref 0.61–1.24)
GFR, Estimated: 52 mL/min — ABNORMAL LOW (ref >60)
Glucose, Bld: 105 mg/dL — ABNORMAL HIGH (ref 70–99)
Potassium: 3.5 mmol/L (ref 3.5–5.1)
Sodium: 138 mmol/L (ref 135–145)

## 2023-02-22 LAB — GLUCOSE, CAPILLARY: Glucose-Capillary: 105 mg/dL — ABNORMAL HIGH (ref 70–99)

## 2023-02-22 MED ORDER — FUROSEMIDE 40 MG PO TABS
40.0000 mg | ORAL_TABLET | Freq: Every day | ORAL | 0 refills | Status: DC
  Filled 2023-02-22: qty 30, 30d supply, fill #0

## 2023-02-22 MED ORDER — POTASSIUM CHLORIDE CRYS ER 20 MEQ PO TBCR
20.0000 meq | EXTENDED_RELEASE_TABLET | Freq: Every day | ORAL | 0 refills | Status: DC
  Filled 2023-02-22: qty 30, 30d supply, fill #0

## 2023-02-22 MED ORDER — SACUBITRIL-VALSARTAN 24-26 MG PO TABS
1.0000 | ORAL_TABLET | Freq: Two times a day (BID) | ORAL | 0 refills | Status: AC
  Filled 2023-02-22: qty 60, 30d supply, fill #0

## 2023-02-22 NOTE — Progress Notes (Addendum)
Heart Failure Navigator Progress Note  Assessed for Heart & Vascular TOC clinic readiness.  Patient has a follow up on 6/18 with [facility]. Will not schedule HF TOC at this time.  Navigation team will sign off.  Sharen Hones, PharmD, BCPS Heart Failure Stewardship Pharmacist Phone [phone]

## 2023-02-22 NOTE — Discharge Summary (Signed)
Physician Discharge Summary  Jonathan [name]. ZOX:[id number] DOB: [birth year] DOA: 02/21/2023  PCP: Wanda Plump, MD  Admit date: 02/21/2023 Discharge date: 02/22/2023  Time spent: 35 minutes  Recommendations for Outpatient Follow-up:  Cardiology/[facility] heart care  FU on 6/18, may need ischemic eval   Discharge Diagnoses:  Principal Problem:   Acute on chronic combined systolic and diastolic CHF (congestive heart failure) (HCC) Active Problems:   COPD mixed type with chronic respiratory failure on 2L oxygen at night and with exertion    DMII (diabetes mellitus, type 2) (HCC)   Essential hypertension   Polycythemia, secondary   Hyperlipidemia   CAD (coronary artery disease)   PVD (peripheral vascular disease) (HCC)   CHF (congestive heart failure) (HCC)   Discharge Condition: improved  Diet recommendation: low sodium, Dm  Filed Weights   02/21/23 0258 02/21/23 1253 02/22/23 0426  Weight: 103.4 kg 98.2 kg 98.2 kg    History of present illness:  Jonathan [name] is an 87 y.o. M with CAD s/p PCI x2 12/2021, PVD s/p L SFA stent 2009, HTN, COPD on noct O2, DM, and HLD who presented after waking with acute paroxysmal dyspnea.    At baseline, the patient can walk about 50 to 100 feet before he has to stop and rest.  He has had no progression of this shortness of breath recently, but has noticed worsening of his chronic orthopnea in the last day or 2.  Overnight woke up and this was severe, so called EMS. In the ER, CXR showed CM and edema.  BNP elevated.  Started on Lasix and admitted.  Hospital Course:   Acute on chronic combined systolic and diastolic CHF  -previous Echo in Apr 2023 showed normal EF and no DD, but with his ischemic HD probably developed some worsening cardiomyopathy since then.  -admitted yesterday w/ vol overload, EF now 40-45%, normal valves, grade I DD. Pt was anxious to discharge home this morning on account of prior commitments, so admitting MD d/w Cards yesterday who  recommended close FU in clinic and deferred further workup at this time. -improved after 2 doses of IV lasix, no edema/JVD or hypoxia now, started entresto yesterday -anxious to leave this morning -discharged home on lasix, entesto, metoprolol, Jardiance - Stopped amlodipine -Cards has arranged FU in 10days, will likely need ischemic workup then    COPD mixed type with chronic respiratory failure on 2L oxygen at night and with exertion  Not on inhalers at home.  Uses O2 at night and during day PRN   DMII (diabetes mellitus, type 2) (HCC) Glucose normal - Continue Jardiance - resume Trulicity and metformin - Continue gabapentin   Polycythemia, secondary Stable relative to baseline, chronic.    PVD (peripheral vascular disease) (HCC)  -FU with Cards, vascular   CAD (coronary artery disease) MI in 2009 status post tenting, repeat LHC/April had 2 more stents placed.  No recent chest pain. - Continue home aspirin, simvastatin, Plavix - Continue amlodipine, metoprolol, Jardiance   Demand ischemia -trop was 18, No chest pain or ECG changes, ischemia ruled out.  Likely this is myocardial injury from CHF flare.   Discharge Exam: Vitals:   02/22/23 0809 02/22/23 0905  BP:  (!) 142/77  Pulse: 73 74  Resp:    Temp:    SpO2: 93% 91%   Gen: Awake, Alert, Oriented X 3,  HEENT: no JVD Lungs: Good air movement bilaterally, CTAB CVS: S1S2/RRR Abd: soft, Non tender, non distended, BS present Extremities: No  edema Skin: no new rashes on exposed skin   Discharge Instructions   Discharge Instructions     AMB Referral to Community Care Coordinaton (ACO Patients)   Complete by: As directed    Hospital Follow up referral/Care Coordination:  Rising risk for unplanned readmission DM A1C 7.4 Primary Care Provider:  Wanda Plump, MD, [place] [facility]  Insurance plan:  Medicare ACO REACH  Please assign to [facility] RN Care Coordinator for post hospital care coordination for  readmission prevention follow up calls and assess for further needs.  Questions please call:   Charlesetta Shanks, RN BSN CCM [facility]  [phone] business mobile phone Toll free office [phone]  *Concierge Line  [phone] Fax number: [phone] [email or web address]   Reason for Referral: Care Coordination (ACO patients)   Disease managment services needed: Nurse Case Manager   Diagnoses of:  Heart Failure Diabetes     Expected date of contact: Urgent - 7 Days   Diet - low sodium heart healthy   Complete by: As directed    Diet Carb Modified   Complete by: As directed    Increase activity slowly   Complete by: As directed       Allergies as of 02/22/2023   No Known Allergies      Medication List     STOP taking these medications    amLODipine 5 MG tablet Commonly known as: NORVASC       TAKE these medications    albuterol 108 (90 Base) MCG/ACT inhaler Commonly known as: VENTOLIN HFA USE 1 TO 2 INHALATIONS EVERY 6 HOURS AS NEEDED FOR WHEEZING OR SHORTNESS OF BREATH   aspirin 81 MG tablet Take 81 mg by mouth daily.   clopidogrel 75 MG tablet Commonly known as: PLAVIX Take 1 tablet (75 mg total) by mouth daily with breakfast.   empagliflozin 25 MG Tabs tablet Commonly known as: Jardiance Take 1 tablet (25 mg total) by mouth daily before breakfast. For diabetes E11.65 and E 11.29   Entresto 24-26 MG Generic drug: sacubitril-valsartan Take 1 tablet by mouth 2 (two) times daily.   FreeStyle Freedom Lite w/Device Kit Use Freestyle Freedom Lite meter to check blood sugar twice daily. DX:E11.65   FREESTYLE LITE test strip Generic drug: glucose blood USE AS INSTRUCTED   furosemide 40 MG tablet Commonly known as: LASIX Take 1 tablet (40 mg total) by mouth daily. Start taking on: February 23, 2023 What changed:  medication strength how much to take    gabapentin 100 MG capsule Commonly known as: NEURONTIN TAKE TWO CAPSULES BY MOUTH TWICE A DAY FOR NEUROPATHY   hydrOXYzine 25 MG tablet Commonly known as: ATARAX Take 0.5-1 tablets (12.5-25 mg total) by mouth every 8 (eight) hours as needed for anxiety.   metFORMIN 1000 MG tablet Commonly known as: GLUCOPHAGE Take 0.5 tablet in am and 1 whole one at dinner.   metoprolol succinate 100 MG 24 hr tablet Commonly known as: Toprol XL Take 1 tablet (100 mg total) by mouth daily.   multivitamin with minerals Tabs tablet Take 1 tablet by mouth daily.   ondansetron 4 MG tablet Commonly known as: ZOFRAN Take 1 tablet (4 mg total) by mouth every 8 (eight) hours as needed for nausea or vomiting.   OXYGEN Inhale 2 L/min into the lungs at bedtime. PRN during day   potassium chloride SA 20 MEQ tablet Commonly known as: KLOR-CON M Take 1 tablet (20 mEq total)  by mouth daily.   Repatha SureClick 140 MG/ML Soaj Generic drug: Evolocumab Inject 140 mg into the skin every 14 (fourteen) days.   simvastatin 20 MG tablet Commonly known as: ZOCOR Take 20 mg by mouth daily.   Sure Comfort Pen Needles 31G X 6 MM Misc Generic drug: Insulin Pen Needle Use as directed   tamsulosin 0.4 MG Caps capsule Commonly known as: FLOMAX Take 1 capsule (0.4 mg total) by mouth daily after supper.   Trulicity 3 MG/0.5ML Sopn Generic drug: Dulaglutide INJECT 3 MG UNDER THE SKIN ONCE A WEEK       No Known Allergies  Follow-up Information     Wanda Plump, MD. Go on 03/06/2023.   Specialty: Internal Medicine Why: @11 :20am Contact information: [address] [place] [address] [phone]                  The results of significant diagnostics from this hospitalization (including imaging, microbiology, ancillary and laboratory) are listed below for reference.    Significant Diagnostic Studies: ECHOCARDIOGRAM COMPLETE  Result Date: 02/21/2023    ECHOCARDIOGRAM REPORT    Patient Name:   Jonathan [name]. Date of Exam: 02/21/2023 Medical Rec #:  [id number]           Height:       74.0 in Accession #:    [id number]          Weight:       228.0 lb Date of Birth:  [birth year]            BSA:          2.298 m Patient Age:    86 years            BP:           130/78 mmHg Patient Gender: M                   HR:           82 bpm. Exam Location:  Inpatient Procedure: 2D Echo, Cardiac Doppler, Color Doppler and Intracardiac            Opacification Agent Indications:    CHF  History:        Patient has prior history of Echocardiogram examinations, most                 recent 01/11/2022. CAD, PAD; Risk Factors:Hypertension and                 Dyslipidemia.  Sonographer:    Melissa Morford RDCS (AE, PE) Referring Phys: [id number] CHRISTOPHER P DANFORD IMPRESSIONS  1. Global hypokinesis worse in the septum and inferior wall. Left ventricular ejection fraction, by estimation, is 40 to 45%. The left ventricle has mildly decreased function. The left ventricle demonstrates global hypokinesis. The left ventricular internal cavity size was moderately dilated. Left ventricular diastolic parameters are consistent with Grade I diastolic dysfunction (impaired relaxation).  2. Right ventricular systolic function is normal. The right ventricular size is normal.  3. Left atrial size was moderately dilated.  4. The mitral valve is abnormal. No evidence of mitral valve regurgitation. No evidence of mitral stenosis.  5. Partial fusion of non and right cusps. The aortic valve is tricuspid. There is mild calcification of the aortic valve. There is mild thickening of the aortic valve. Aortic valve regurgitation is mild. Aortic valve sclerosis is present, with no evidence of aortic valve stenosis.  6.  The inferior vena cava is normal in size with greater than 50% respiratory variability, suggesting right atrial pressure of 3 mmHg. FINDINGS  Left Ventricle: Global hypokinesis worse in the septum and inferior wall. Left  ventricular ejection fraction, by estimation, is 40 to 45%. The left ventricle has mildly decreased function. The left ventricle demonstrates global hypokinesis. Definity contrast agent was given IV to delineate the left ventricular endocardial borders. The left ventricular internal cavity size was moderately dilated. There is no left ventricular hypertrophy. Left ventricular diastolic parameters are consistent with Grade  I diastolic dysfunction (impaired relaxation). Right Ventricle: The right ventricular size is normal. No increase in right ventricular wall thickness. Right ventricular systolic function is normal. Left Atrium: Left atrial size was moderately dilated. Right Atrium: Right atrial size was normal in size. Pericardium: There is no evidence of pericardial effusion. Mitral Valve: The mitral valve is abnormal. There is mild thickening of the mitral valve leaflet(s). There is mild calcification of the mitral valve leaflet(s). No evidence of mitral valve regurgitation. No evidence of mitral valve stenosis. Tricuspid Valve: The tricuspid valve is normal in structure. Tricuspid valve regurgitation is not demonstrated. No evidence of tricuspid stenosis. Aortic Valve: Partial fusion of non and right cusps. The aortic valve is tricuspid. There is mild calcification of the aortic valve. There is mild thickening of the aortic valve. Aortic valve regurgitation is mild. Aortic regurgitation PHT measures 603 msec. Aortic valve sclerosis is present, with no evidence of aortic valve stenosis. Pulmonic Valve: The pulmonic valve was normal in structure. Pulmonic valve regurgitation is not visualized. No evidence of pulmonic stenosis. Aorta: The aortic root is normal in size and structure. Venous: The inferior vena cava is normal in size with greater than 50% respiratory variability, suggesting right atrial pressure of 3 mmHg. IAS/Shunts: No atrial level shunt detected by color flow Doppler.  LEFT VENTRICLE PLAX 2D  LVIDd:         5.10 cm   Diastology LVIDs:         4.50 cm   LV e' medial:    3.26 cm/s LV PW:         1.40 cm   LV E/e' medial:  14.3 LV IVS:        1.10 cm   LV e' lateral:   4.35 cm/s LVOT diam:     2.00 cm   LV E/e' lateral: 10.7 LV SV:         51 LV SV Index:   22 LVOT Area:     3.14 cm  RIGHT VENTRICLE RV S prime:     10.20 cm/s TAPSE (M-mode): 2.0 cm LEFT ATRIUM             Index        RIGHT ATRIUM           Index LA diam:        4.50 cm 1.96 cm/m   RA Area:     15.90 cm LA Vol (A2C):   45.3 ml 19.71 ml/m  RA Volume:   38.00 ml  16.54 ml/m LA Vol (A4C):   56.8 ml 24.72 ml/m LA Biplane Vol: 53.2 ml 23.15 ml/m  AORTIC VALVE LVOT Vmax:   86.60 cm/s LVOT Vmean:  66.800 cm/s LVOT VTI:    0.162 m AI PHT:      603 msec  AORTA Ao Root diam: 3.60 cm Ao Asc diam:  3.70 cm MITRAL VALVE MV Area (PHT): 4.68 cm    SHUNTS  MV Decel Time: 162 msec    Systemic VTI:  0.16 m MV E velocity: 46.70 cm/s  Systemic Diam: 2.00 cm MV A velocity: 94.70 cm/s MV E/A ratio:  0.49 Charlton Haws MD Electronically signed by Charlton Haws MD Signature Date/Time: 02/21/2023/11:54:30 AM    Final    DG Chest 2 View  Result Date: 02/21/2023 CLINICAL DATA:  Shortness of breath EXAM: CHEST - 2 VIEW COMPARISON:  12/18/2022 FINDINGS: Cardiac shadow is enlarged but stable. The lungs are well aerated bilaterally. Mild persistent interstitial density is seen without focal confluent infiltrate. No sizable effusion is seen. No bony abnormality is noted. IMPRESSION: Changes most consistent with interstitial edema. Electronically Signed   By: Alcide Clever M.D.   On: 02/21/2023 03:51    Microbiology: No results found for this or any previous visit (from the past 240 hour(s)).   Labs: Basic Metabolic Panel: Recent Labs  Lab 02/21/23 0322 02/22/23 0059  NA 138 138  K 3.8 3.5  CL 105 99  CO2 21* 22  GLUCOSE 124* 105*  BUN 15 20  CREATININE 1.20 1.34*  CALCIUM 8.8* 9.0   Liver Function Tests: Recent Labs  Lab 02/21/23 0322  AST 37   ALT 30  ALKPHOS 61  BILITOT 0.7  PROT 6.3*  ALBUMIN 3.3*   No results for input(s): "LIPASE", "AMYLASE" in the last 168 hours. No results for input(s): "AMMONIA" in the last 168 hours. CBC: Recent Labs  Lab 02/21/23 0322 02/22/23 0059  WBC 9.8 8.7  NEUTROABS 6.8  --   HGB 16.5 17.1*  HCT 49.3 48.9  MCV 100.6* 97.6  PLT 256 207   Cardiac Enzymes: No results for input(s): "CKTOTAL", "CKMB", "CKMBINDEX", "TROPONINI" in the last 168 hours. BNP: BNP (last 3 results) Recent Labs    12/18/22 1454 02/21/23 0338  BNP 431.8* 464.6*    ProBNP (last 3 results) No results for input(s): "PROBNP" in the last 8760 hours.  CBG: Recent Labs  Lab 02/21/23 1303 02/21/23 1618 02/21/23 2052 02/22/23 0605  GLUCAP 96 124* 139* 105*       Signed:  Zannie Cove MD.  [facility] 02/22/2023, 2:45 PM

## 2023-02-22 NOTE — Consult Note (Signed)
   [facility] CM Inpatient Consult   02/22/2023  Jonathan [name]. 1936-06-21 [id number]  [facility] [[facility]]  Accountable Care Organization [ACO] Patient:  Medicare ACO REACH  Primary Care Provider:  Wanda Plump, MD with [place] at [facility] which is listed to provide the transition of care follow up.   Patient screened and discussed in unit progression meeting for hospitalization with low risk noted to assess for potential [facility]  [[facility]] Care Management service needs for post hospital transition for care coordination.  Patient was reviewed for hospitalization and chronic disease noted and could benefit from post hospital follow up.  11:30 am Came by to speak with patient on unit rounds and patient had transitioned home.  12:17 pm Call placed to patient, HIPAA verified.  Explained reason for call for any post hospital follow up needs.  Patient was accepting and pleasant, noted having a birthday coming up Sunday. Patient is accepting of post hospital follow up call  Plan:  Referral request for community care coordination:  Referral for post hospital follow up is likely to have follow up with [facility] TOC team.  Of note, [facility] Care Management/Population Health does not replace or interfere with any arrangements made by the Inpatient Transition of Care team.  For questions contact:   Charlesetta Shanks, RN BSN CCM [facility]  [phone] business mobile phone Toll free office [phone]  *Concierge Line  [phone] Fax number: [phone] [email or web address]

## 2023-02-22 NOTE — TOC Benefit Eligibility Note (Signed)
Patient Product/process development scientist completed.    The patient is currently admitted and upon discharge could be taking Entresto 24-26 mg.  The current 30 day co-pay is $43.00.   The patient is insured through [employer]   This test claim was processed through [facility]- copay amounts may vary at other pharmacies due to pharmacy/plan contracts, or as the patient moves through the different stages of their insurance plan.  Roland Earl, CPHT Pharmacy Patient Advocate Specialist [facility] Health Pharmacy Patient Advocate Team Direct Number: [phone]  Fax: [phone]

## 2023-02-22 NOTE — Plan of Care (Signed)
  Problem: Education: Goal: Ability to describe self-care measures that may prevent or decrease complications (Diabetes Survival Skills Education) will improve Outcome: Adequate for Discharge Goal: Individualized Educational Video(s) Outcome: Adequate for Discharge   Problem: Coping: Goal: Ability to adjust to condition or change in health will improve Outcome: Adequate for Discharge   Problem: Fluid Volume: Goal: Ability to maintain a balanced intake and output will improve Outcome: Adequate for Discharge   Problem: Health Behavior/Discharge Planning: Goal: Ability to identify and utilize available resources and services will improve Outcome: Adequate for Discharge Goal: Ability to manage health-related needs will improve Outcome: Adequate for Discharge   Problem: Metabolic: Goal: Ability to maintain appropriate glucose levels will improve Outcome: Adequate for Discharge   Problem: Nutritional: Goal: Maintenance of adequate nutrition will improve Outcome: Adequate for Discharge Goal: Progress toward achieving an optimal weight will improve Outcome: Adequate for Discharge   Problem: Skin Integrity: Goal: Risk for impaired skin integrity will decrease Outcome: Adequate for Discharge   Problem: Tissue Perfusion: Goal: Adequacy of tissue perfusion will improve Outcome: Adequate for Discharge   Problem: Education: Goal: Ability to demonstrate management of disease process will improve Outcome: Adequate for Discharge Goal: Ability to verbalize understanding of medication therapies will improve Outcome: Adequate for Discharge Goal: Individualized Educational Video(s) Outcome: Adequate for Discharge   Problem: Activity: Goal: Capacity to carry out activities will improve Outcome: Adequate for Discharge   Problem: Cardiac: Goal: Ability to achieve and maintain adequate cardiopulmonary perfusion will improve Outcome: Adequate for Discharge   Problem: Education: Goal:  Ability to demonstrate management of disease process will improve Outcome: Adequate for Discharge Goal: Ability to verbalize understanding of medication therapies will improve Outcome: Adequate for Discharge Goal: Individualized Educational Video(s) Outcome: Adequate for Discharge   Problem: Activity: Goal: Capacity to carry out activities will improve Outcome: Adequate for Discharge   Problem: Cardiac: Goal: Ability to achieve and maintain adequate cardiopulmonary perfusion will improve Outcome: Adequate for Discharge   Problem: Education: Goal: Knowledge of General Education information will improve Description: Including pain rating scale, medication(s)/side effects and non-pharmacologic comfort measures Outcome: Adequate for Discharge   Problem: Health Behavior/Discharge Planning: Goal: Ability to manage health-related needs will improve Outcome: Adequate for Discharge   Problem: Clinical Measurements: Goal: Ability to maintain clinical measurements within normal limits will improve Outcome: Adequate for Discharge Goal: Will remain free from infection Outcome: Adequate for Discharge Goal: Diagnostic test results will improve Outcome: Adequate for Discharge Goal: Respiratory complications will improve Outcome: Adequate for Discharge Goal: Cardiovascular complication will be avoided Outcome: Adequate for Discharge   Problem: Activity: Goal: Risk for activity intolerance will decrease Outcome: Adequate for Discharge   Problem: Nutrition: Goal: Adequate nutrition will be maintained Outcome: Adequate for Discharge   Problem: Coping: Goal: Level of anxiety will decrease Outcome: Adequate for Discharge   Problem: Elimination: Goal: Will not experience complications related to bowel motility Outcome: Adequate for Discharge Goal: Will not experience complications related to urinary retention Outcome: Adequate for Discharge   Problem: Pain Managment: Goal: General  experience of comfort will improve Outcome: Adequate for Discharge   Problem: Safety: Goal: Ability to remain free from injury will improve Outcome: Adequate for Discharge   Problem: Skin Integrity: Goal: Risk for impaired skin integrity will decrease Outcome: Adequate for Discharge

## 2023-02-22 NOTE — TOC Transition Note (Addendum)
Transition of Care [facility]) - CM/SW Discharge Note   Patient Details  Name: Jonathan [name]. MRN: [id number] Date of Birth: [birth year]  [facility] CM/SW Contact:  Leone Haven, RN Phone Number: 02/22/2023, 10:04 AM   Clinical Narrative:    Patient is for dc today, he has no needs , he has transportation. Pharmacy checking cost for refill for entresto. his copay is $43.00. He has [employer]. [facility] is the only [facility] pharmacy that takes Tricare. His cost for meds is 17.00 patient states he can pay, he also says he does go to [facility] , NCM contacted [facility] his PCP is Audree Camel, at [facility], fax number is [phone], NCM will fax dc summary to PCP at the [place].  CSW at [facility] is [facility] [phone] ext [phone].Marland Kitchen NCM  left message with April at [facility] for notification of hospitalization. [facility]     Barriers to Discharge: Continued Medical Work up   Patient Goals and CMS Choice      Discharge Placement                         Discharge Plan and Services Additional resources added to the After Visit Summary for   In-house Referral: NA Discharge Planning Services: CM Consult Post Acute Care Choice: NA          DME Arranged: N/A DME Agency: NA       HH Arranged: NA          Social Determinants of Health (SDOH) Interventions SDOH Screenings   Food Insecurity: No Food Insecurity (02/21/2023)  Housing: Low Risk  (02/21/2023)  Transportation Needs: No Transportation Needs (02/21/2023)  Utilities: Not At Risk (02/21/2023)  Alcohol Screen: Low Risk  (07/20/2021)  Depression (PHQ2-9): Low Risk  (11/29/2022)  Financial Resource Strain: Low Risk  (07/20/2021)  Physical Activity: Inactive (07/20/2021)  Social Connections: Socially Integrated (07/20/2021)  Stress: No Stress Concern Present (07/20/2021)  Tobacco Use: Medium Risk (02/21/2023)     Readmission Risk Interventions     No data to  display

## 2023-02-25 ENCOUNTER — Telehealth: Payer: Self-pay | Admitting: *Deleted

## 2023-02-25 NOTE — Progress Notes (Signed)
  Care Coordination   Note   02/25/2023 Name: Jonathan [name]. MRN: [id number] DOB: 08/05/1936  Jonathan [name]. is a 87 y.o. year old male who sees Wanda Plump, MD for primary care. I reached out to Jonathan [name]. by phone today to offer care coordination services.  [name] was given information about Care Coordination services today including:   The Care Coordination services include support from the care team which includes your Nurse Coordinator, Clinical Social Worker, or Pharmacist.  The Care Coordination team is here to help remove barriers to the health concerns and goals most important to you. Care Coordination services are voluntary, and the patient may decline or stop services at any time by request to their care team member.   Care Coordination Consent Status: Patient agreed to services and verbal consent obtained.   Follow up plan:  Telephone appointment with care coordination team member scheduled for:  03/08/2023  Encounter Outcome:  Pt. Scheduled from referral   Burman Nieves, [facility] Care Coordination Care Guide Direct Dial: [phone]

## 2023-02-27 ENCOUNTER — Other Ambulatory Visit: Payer: Self-pay | Admitting: Internal Medicine

## 2023-03-02 NOTE — Progress Notes (Unsigned)
Cardiology Office Note:    Date:  03/05/2023   ID:  Jonathan Beath., DOB 1936/07/30, MRN [id number]  PCP:  Wanda Plump, MD   [facility] Providers Cardiologist:  Nanetta Batty, MD Cardiology APP:  Marcelino Duster, [place] { Referring MD: Wanda Plump, MD   Chief Complaint  Patient presents with   Hospitalization Follow-up    New CHF    History of Present Illness:    Jonathan Donica. is a 87 y.o. male with a hx of CAD, PVD, prior smoking history, severe COPD, hypertension, hyperlipidemia, and DM.  He has a prior left SFA stent.  He has chronic polycythemia from hypoxia.  CAD RCA stent 2006 DES-RCA, DES-LAD 12/2021, residual LCX dz tx medically - note sx were SOB, not chest pain  Chronic systolic and diastolic heart failure Echo 12/2021 with preserved EF, moderate TR Echo 02/2023 with LVEF 40-45%, grade 1 DD, moderate LAE  He was admitted 02/21/2023 with acute dyspnea found to have a new LVEF of 40-45%, grade 1 DD.  Patient wished to discharge after an overnight stay in the hospital, which was granted with close follow-up with cardiology.  He was diuresed with IV Lasix and was discharged on p.o. Lasix, Entresto, metoprolol, and Jardiance.  Amlodipine was stopped.  He presents today for cardiology follow up of suspected new hypertension mediated cardiomyopathy. He states he continues to have issues breathing.  He reports dyspnea at rest, but is not sure if this is changed, improved, worse after starting entresto. He states he has to stop and rest when walking due to fatigue and dyspnea, he is concerned and describes this as his anginal equivalent.    Past Medical History:  Diagnosis Date   CAD (coronary artery disease)    Two RCA stents remotely / 3rd RCA stent 2006   Colon polyps    s/p several Cscopes.   COPD (chronic obstructive pulmonary disease) (HCC)    on O2, nocturnal   Diabetes mellitus    dx aprox 2009   ED (erectile dysfunction)    has a vacumm device    Ejection fraction    Hyperlipidemia    dx in 90s   Hypertension    dx in the 45   Hypogonadism male    PVD (peripheral vascular disease) (HCC)    s/p stents at LE 2009, Dr Jacinto Halim   Shortness of breath    O2 Sat dropped to 82% walking on the treadmill, September, 2012    Past Surgical History:  Procedure Laterality Date   APPENDECTOMY     CORONARY STENT INTERVENTION N/A 01/12/2022   Procedure: CORONARY STENT INTERVENTION;  Surgeon: Swaziland, Peter M, MD;  Location: [facility] INVASIVE CV LAB;  Service: Cardiovascular;  Laterality: N/A;   RIGHT/LEFT HEART CATH AND CORONARY ANGIOGRAPHY N/A 01/11/2022   Procedure: RIGHT/LEFT HEART CATH AND CORONARY ANGIOGRAPHY;  Surgeon: Lyn Records, MD;  Location: [facility] INVASIVE CV LAB;  Service: Cardiovascular;  Laterality: N/A;   TONSILLECTOMY      Current Medications: Current Meds  Medication Sig   albuterol (VENTOLIN HFA) 108 (90 Base) MCG/ACT inhaler USE 1 TO 2 INHALATIONS EVERY 6 HOURS AS NEEDED FOR WHEEZING OR SHORTNESS OF BREATH   aspirin 81 MG tablet Take 81 mg by mouth daily.   Blood Glucose Monitoring Suppl (FREESTYLE FREEDOM LITE) w/Device KIT Use Freestyle Freedom Lite meter to check blood sugar twice daily. DX:E11.65   clopidogrel (PLAVIX) 75 MG tablet Take 1 tablet (75 mg  total) by mouth daily with breakfast.   Dulaglutide (TRULICITY) 3 MG/0.5ML SOPN INJECT 3 MG UNDER THE SKIN ONCE A WEEK   empagliflozin (JARDIANCE) 25 MG TABS tablet Take 1 tablet (25 mg total) by mouth daily before breakfast. For diabetes E11.65 and E 11.29   furosemide (LASIX) 40 MG tablet Take 1 tablet (40 mg total) by mouth daily.   gabapentin (NEURONTIN) 100 MG capsule TAKE TWO CAPSULES BY MOUTH TWICE A DAY FOR NEUROPATHY   glucose blood (FREESTYLE LITE) test strip USE AS INSTRUCTED   hydrOXYzine (ATARAX) 25 MG tablet Take 0.5-1 tablets (12.5-25 mg total) by mouth every 8 (eight) hours as needed for anxiety.   Insulin Pen Needle (SURE COMFORT PEN NEEDLES) 31G X 6 MM MISC Use  as directed   metFORMIN (GLUCOPHAGE) 1000 MG tablet Take 0.5 tablet in am and 1 whole one at dinner.   metoprolol succinate (TOPROL XL) 100 MG 24 hr tablet Take 1 tablet (100 mg total) by mouth daily.   Multiple Vitamin (MULTIVITAMIN WITH MINERALS) TABS tablet Take 1 tablet by mouth daily.   ondansetron (ZOFRAN) 4 MG tablet Take 1 tablet (4 mg total) by mouth every 8 (eight) hours as needed for nausea or vomiting.   OXYGEN Inhale 2 L/min into the lungs at bedtime. PRN during day   potassium chloride SA (KLOR-CON M) 20 MEQ tablet Take 1 tablet (20 mEq total) by mouth daily.   sacubitril-valsartan (ENTRESTO) 24-26 MG Take 1 tablet by mouth 2 (two) times daily.   simvastatin (ZOCOR) 20 MG tablet Take 20 mg by mouth daily.   tamsulosin (FLOMAX) 0.4 MG CAPS capsule Take 1 capsule (0.4 mg total) by mouth daily after supper.     Allergies:   Patient has no known allergies.   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   Tobacco Use   Smoking status: Former    Packs/day: 2.00    Years: 30.00    Additional pack years: 0.00    Total pack years: 60.00    Types: Cigarettes    Quit date: 06/17/1977    Years since quitting: 45.7   Smokeless tobacco: Never   Tobacco comments:    2 ppd, quit 1978  Vaping Use   Vaping Use: Never used  Substance and Sexual Activity   Alcohol use: Yes    Alcohol/week: 0.0 standard drinks of alcohol    Comment: socially    Drug use: No   Sexual activity: Yes  Other Topics Concern   Not on file  Social History Narrative   4 children (lost 1 son)   Wife has 2 children                Social Determinants of Health   Financial Resource Strain: Low Risk  (07/20/2021)   Overall Financial Resource Strain (CARDIA)    Difficulty of  Paying Living Expenses: Not hard at all  Food Insecurity: No Food Insecurity (02/21/2023)   Hunger Vital Sign    Worried About Running Out of Food in the Last Year: Never true    Ran Out of Food in the Last Year: Never true  Transportation Needs: No Transportation Needs (02/21/2023)   PRAPARE - Transportation  Lack of Transportation (Medical): No    Lack of Transportation (Non-Medical): No  Physical Activity: Inactive (07/20/2021)   Exercise Vital Sign    Days of Exercise per Week: 0 days    Minutes of Exercise per Session: 0 min  Stress: No Stress Concern Present (07/20/2021)   [employer] of Occupational Health - Occupational Stress Questionnaire    Feeling of Stress : Not at all  Social Connections: Socially Integrated (07/20/2021)   Social Connection and Isolation Panel [NHANES]    Frequency of Communication with Friends and Family: More than three times a week    Frequency of Social Gatherings with Friends and Family: More than three times a week    Attends Religious Services: More than 4 times per year    Active Member of [employer] or Organizations: Yes    Attends Engineer, structural: More than 4 times per year    Marital Status: Married     Family History: The patient's family history includes Diabetes in his maternal grandmother, paternal aunt, and another family member; Heart disease in his father; Hyperlipidemia in an other family member; Hypertension in his father; Prostate cancer in his brother; Stroke in his father. There is no history of Colon cancer.  ROS:   Please see the history of present illness.     All other systems reviewed and are negative.  EKGs/Labs/Other Studies Reviewed:    The following studies were reviewed today:  Echo 02/21/23: 1. Global hypokinesis worse in the septum and inferior wall. Left  ventricular ejection fraction, by estimation, is 40 to 45%. The left  ventricle has mildly decreased function. The left ventricle demonstrates   global hypokinesis. The left ventricular  internal cavity size was moderately dilated. Left ventricular diastolic  parameters are consistent with Grade I diastolic dysfunction (impaired  relaxation).   2. Right ventricular systolic function is normal. The right ventricular  size is normal.   3. Left atrial size was moderately dilated.   4. The mitral valve is abnormal. No evidence of mitral valve  regurgitation. No evidence of mitral stenosis.   5. Partial fusion of non and right cusps. The aortic valve is tricuspid.  There is mild calcification of the aortic valve. There is mild thickening  of the aortic valve. Aortic valve regurgitation is mild. Aortic valve  sclerosis is present, with no  evidence of aortic valve stenosis.   6. The inferior vena cava is normal in size with greater than 50%  respiratory variability, suggesting right atrial pressure of 3 mmHg.    Coronary stent intervention 01/12/22:   Prox LAD lesion is 90% stenosed.   A drug-eluting stent was successfully placed using a SYNERGY XD 4.0X20.   Post intervention, there is a 0% residual stenosis.   Prox RCA lesion is 75% stenosed.   A drug-eluting stent was successfully placed using a SYNERGY XD 3.0X16.   Post intervention, there is a 0% residual stenosis.   Successful PCI of the mid LAD with DES x 1 Successful PCI of the proximal RCA with DES x 1    EKG:  EKG is  ordered today.  The ekg ordered today demonstrates EKG Interpretation  Date/Time:  Tuesday March 05 2023 09:10:35 EDT Ventricular Rate:  61 PR Interval:  276 QRS Duration: 90 QT Interval:  424 QTC Calculation: 426 R Axis:   -5 Text Interpretation: Sinus rhythm with 1st degree A-V block Possible Anterior infarct , age undetermined When compared with ECG of 21-Feb-2023 02:57, PREVIOUS ECG  IS PRESENT Confirmed by Micah Flesher ([id number]) on 03/05/2023 9:27:02 AM   Recent Labs: 06/04/2022: TSH 2.64 02/21/2023: ALT 30; B Natriuretic Peptide 464.6 02/22/2023:  BUN 20; Creatinine, Ser 1.34; Hemoglobin 17.1; Platelets 207; Potassium 3.5; Sodium 138  Recent Lipid Panel    Component Value Date/Time   CHOL 117 01/12/2022 0344   TRIG 81 01/12/2022 0344   HDL 36 (L) 01/12/2022 0344   CHOLHDL 3.3 01/12/2022 0344   VLDL 16 01/12/2022 0344   LDLCALC 65 01/12/2022 0344   LDLDIRECT 55.0 01/01/2018 0808     Risk Assessment/Calculations:                Physical Exam:    VS:  BP 130/68 (BP Location: Left Arm, Patient Position: Sitting, Cuff Size: Normal)   Pulse 65   Ht 6\' 2"  (1.88 m)   Wt 224 lb (101.6 kg)   SpO2 93%   BMI 28.76 kg/m     Wt Readings from Last 3 Encounters:  03/05/23 224 lb (101.6 kg)  02/22/23 216 lb 7.9 oz (98.2 kg)  02/05/23 226 lb 12.8 oz (102.9 kg)     GEN:  Well nourished, well developed in no acute distress HEENT: Normal NECK: No JVD; No carotid bruits LYMPHATICS: No lymphadenopathy CARDIAC: RRR, no murmurs, rubs, gallops RESPIRATORY:  Clear to auscultation without rales, wheezing or rhonchi  ABDOMEN: Soft, non-tender, non-distended MUSCULOSKELETAL:  No edema; No deformity  SKIN: Warm and dry NEUROLOGIC:  Alert and oriented x 3 PSYCHIATRIC:  Normal affect   ASSESSMENT:    1. Coronary artery disease involving native coronary artery of native heart without angina pectoris   2. Preprocedural examination   3. Unstable angina (HCC)   4. CAD S/P percutaneous coronary angioplasty   5. Chronic combined systolic and diastolic heart failure (HCC)   6. Primary hypertension   7. Hyperlipidemia with target LDL less than 70    PLAN:    In order of problems listed above:  Chronic systolic and diastolic heart failure - newly recognized LVEF 40-45% - BNP 465 - given history of CAD, would consider ischemic evaluation - no chest pain at the time of last PCI in 2023 - GDMT: 25 mg jardiance, 40 mg lasix, 100 mg toprol, 24-26 mg entresto BID, potassium 20 mEq - needs BMP today   Hypertension  Managed in the  context of the above - home amlodipine stopped in the hospital   CAD - DES-RCA, DES-LAD 12/2021 - remains on DAPT with ASA and plavix - no chest pain - former smoker, on insulin - HS troponin 18 during recent hospitalization - note, CE negative at the time of his last PCI in 2023 - case discussed with Dr. Allyson Sabal, will proceed with LHC   Hyperlipidemia with LDL goal < 55 - given insulin use and prior tobacco use, would consider a lower LDL goal - never started on praluent - [facility]   Follow up in 2 weeks.     Informed Consent   Shared Decision Making/Informed Consent The risks [stroke (1 in 1000), death (1 in 1000), kidney failure [usually temporary] (1 in 500), bleeding (1 in 200), allergic reaction [possibly serious] (1 in 200)], benefits (diagnostic support and management of coronary artery disease) and alternatives of a cardiac catheterization were discussed in detail with Jonathan [name] and he is willing to proceed.       Medication Adjustments/Labs and Tests Ordered: Current medicines are reviewed at length with the patient  today.  Concerns regarding medicines are outlined above.  Orders Placed This Encounter  Procedures   Basic metabolic panel   Brain natriuretic peptide   CBC   EKG 12-Lead   No orders of the defined types were placed in this encounter.   Patient Instructions  Medication Instructions:   STOP Repatha  START Praluent (Alirocumab) 75 mg/mL inject 1 mL into skin every 14 days.   *If you need a refill on your cardiac medications before your next appointment, please call your pharmacy*  Lab Work: Your physician recommends that you have lab work TODAY:  BMP BNP CBC  If you have labs (blood work) drawn today and your tests are completely normal, you will receive your results only by: MyChart Message (if you have MyChart) OR A paper copy in the mail If you have any lab test that is abnormal or we need to change  your treatment, we will call you to review the results.  Testing/Procedures: Your physician has requested that you have a cardiac catheterization. Cardiac catheterization is used to diagnose and/or treat various heart conditions. Doctors may recommend this procedure for a number of different reasons. The most common reason is to evaluate chest pain. Chest pain can be a symptom of coronary artery disease (CAD), and cardiac catheterization can show whether plaque is narrowing or blocking your heart's arteries. This procedure is also used to evaluate the valves, as well as measure the blood flow and oxygen levels in different parts of your heart. For further information please visit [email or web address] Please follow instruction sheet, as given.  Scheduled for Wednesday 03/13/23 at [facility] at 10:00 AM   Follow-Up: At [facility], you and your health needs are our priority.  As part of our continuing mission to provide you with exceptional heart care, we have created designated Provider Care Teams.  These Care Teams include your primary Cardiologist (physician) and Advanced Practice Providers (APPs -  Physician Assistants and Nurse Practitioners) who all work together to provide you with the care you need, when you need it.   Your next appointment:   2 week(s)  Provider:   Micah Flesher, PA-C or APP        Other Instructions       Cardiac/Peripheral Catheterization   You are scheduled for a Cardiac Catheterization on Wednesday, June 26 with Dr. Peter Swaziland.  1. Please arrive at the [facility] (Main Entrance A) at [facility]: [address] [place], [place] [address] at 8:00 AM (This time is 2 hour(s) before your procedure to ensure your preparation). Free valet parking service is available. You will check in at ADMITTING. The support person will be asked to wait in the waiting room.  It is OK to have someone drop you off and come back when you are ready to be  discharged.        Special note: Every effort is made to have your procedure done on time. Please understand that emergencies sometimes delay scheduled procedures.  2. Diet: Do not eat solid foods after midnight.  You may have clear liquids until 5 AM the day of the procedure.  3. Labs: You will need to have blood drawn on Tuesday, June 18 at [facility] [address], [place]  Open: 8am - 5pm (Lunch 12:30 - 1:30)   Phone: [phone]. You do not need to be fasting.  4. Medication instructions in preparation for your procedure:   Contrast Allergy: No  Stop  taking, Entresto  Wednesday, June 26,, Lasix (Furosemide)  Wednesday, June 26,, Potassium  Wednesday, June 26, RESTART the day after procedure   Do not take Diabetes Med Glucophage (Metformin) on the day of the procedure and HOLD 48 HOURS AFTER THE PROCEDURE.  On the morning of your procedure, take Aspirin 81 mg and Plavix/Clopidogrel and any morning medicines NOT listed above.  You may use sips of water.  5. Plan to go home the same day, you will only stay overnight if medically necessary. 6. You MUST have a responsible adult to drive you home. 7. An adult MUST be with you the first 24 hours after you arrive home. 8. Bring a current list of your medications, and the last time and date medication taken. 9. Bring ID and current insurance cards. 10.Please wear clothes that are easy to get on and off and wear slip-on shoes.  Thank you for allowing [place] to care for you!   -- [facility] Invasive Cardiovascular services     Signed, Marcelino Duster, [place]  03/05/2023 4:46 PM     HeartCare

## 2023-03-02 NOTE — H&P (View-Only) (Signed)
Cardiology Office Note:    Date:  03/05/2023   ID:  Jonathan Beath., DOB 1936/07/30, MRN [id number]  PCP:  Wanda Plump, MD   [facility] Providers Cardiologist:  Nanetta Batty, MD Cardiology APP:  Marcelino Duster, [place] { Referring MD: Wanda Plump, MD   Chief Complaint  Patient presents with   Hospitalization Follow-up    New CHF    History of Present Illness:    Jonathan Donica. is a 87 y.o. male with a hx of CAD, PVD, prior smoking history, severe COPD, hypertension, hyperlipidemia, and DM.  He has a prior left SFA stent.  He has chronic polycythemia from hypoxia.  CAD RCA stent 2006 DES-RCA, DES-LAD 12/2021, residual LCX dz tx medically - note sx were SOB, not chest pain  Chronic systolic and diastolic heart failure Echo 12/2021 with preserved EF, moderate TR Echo 02/2023 with LVEF 40-45%, grade 1 DD, moderate LAE  He was admitted 02/21/2023 with acute dyspnea found to have a new LVEF of 40-45%, grade 1 DD.  Patient wished to discharge after an overnight stay in the hospital, which was granted with close follow-up with cardiology.  He was diuresed with IV Lasix and was discharged on p.o. Lasix, Entresto, metoprolol, and Jardiance.  Amlodipine was stopped.  He presents today for cardiology follow up of suspected new hypertension mediated cardiomyopathy. He states he continues to have issues breathing.  He reports dyspnea at rest, but is not sure if this is changed, improved, worse after starting entresto. He states he has to stop and rest when walking due to fatigue and dyspnea, he is concerned and describes this as his anginal equivalent.    Past Medical History:  Diagnosis Date   CAD (coronary artery disease)    Two RCA stents remotely / 3rd RCA stent 2006   Colon polyps    s/p several Cscopes.   COPD (chronic obstructive pulmonary disease) (HCC)    on O2, nocturnal   Diabetes mellitus    dx aprox 2009   ED (erectile dysfunction)    has a vacumm device    Ejection fraction    Hyperlipidemia    dx in 90s   Hypertension    dx in the 45   Hypogonadism male    PVD (peripheral vascular disease) (HCC)    s/p stents at LE 2009, Dr Jacinto Halim   Shortness of breath    O2 Sat dropped to 82% walking on the treadmill, September, 2012    Past Surgical History:  Procedure Laterality Date   APPENDECTOMY     CORONARY STENT INTERVENTION N/A 01/12/2022   Procedure: CORONARY STENT INTERVENTION;  Surgeon: Swaziland, Peter M, MD;  Location: [facility] INVASIVE CV LAB;  Service: Cardiovascular;  Laterality: N/A;   RIGHT/LEFT HEART CATH AND CORONARY ANGIOGRAPHY N/A 01/11/2022   Procedure: RIGHT/LEFT HEART CATH AND CORONARY ANGIOGRAPHY;  Surgeon: Lyn Records, MD;  Location: [facility] INVASIVE CV LAB;  Service: Cardiovascular;  Laterality: N/A;   TONSILLECTOMY      Current Medications: Current Meds  Medication Sig   albuterol (VENTOLIN HFA) 108 (90 Base) MCG/ACT inhaler USE 1 TO 2 INHALATIONS EVERY 6 HOURS AS NEEDED FOR WHEEZING OR SHORTNESS OF BREATH   aspirin 81 MG tablet Take 81 mg by mouth daily.   Blood Glucose Monitoring Suppl (FREESTYLE FREEDOM LITE) w/Device KIT Use Freestyle Freedom Lite meter to check blood sugar twice daily. DX:E11.65   clopidogrel (PLAVIX) 75 MG tablet Take 1 tablet (75 mg  total) by mouth daily with breakfast.   Dulaglutide (TRULICITY) 3 MG/0.5ML SOPN INJECT 3 MG UNDER THE SKIN ONCE A WEEK   empagliflozin (JARDIANCE) 25 MG TABS tablet Take 1 tablet (25 mg total) by mouth daily before breakfast. For diabetes E11.65 and E 11.29   furosemide (LASIX) 40 MG tablet Take 1 tablet (40 mg total) by mouth daily.   gabapentin (NEURONTIN) 100 MG capsule TAKE TWO CAPSULES BY MOUTH TWICE A DAY FOR NEUROPATHY   glucose blood (FREESTYLE LITE) test strip USE AS INSTRUCTED   hydrOXYzine (ATARAX) 25 MG tablet Take 0.5-1 tablets (12.5-25 mg total) by mouth every 8 (eight) hours as needed for anxiety.   Insulin Pen Needle (SURE COMFORT PEN NEEDLES) 31G X 6 MM MISC Use  as directed   metFORMIN (GLUCOPHAGE) 1000 MG tablet Take 0.5 tablet in am and 1 whole one at dinner.   metoprolol succinate (TOPROL XL) 100 MG 24 hr tablet Take 1 tablet (100 mg total) by mouth daily.   Multiple Vitamin (MULTIVITAMIN WITH MINERALS) TABS tablet Take 1 tablet by mouth daily.   ondansetron (ZOFRAN) 4 MG tablet Take 1 tablet (4 mg total) by mouth every 8 (eight) hours as needed for nausea or vomiting.   OXYGEN Inhale 2 L/min into the lungs at bedtime. PRN during day   potassium chloride SA (KLOR-CON M) 20 MEQ tablet Take 1 tablet (20 mEq total) by mouth daily.   sacubitril-valsartan (ENTRESTO) 24-26 MG Take 1 tablet by mouth 2 (two) times daily.   simvastatin (ZOCOR) 20 MG tablet Take 20 mg by mouth daily.   tamsulosin (FLOMAX) 0.4 MG CAPS capsule Take 1 capsule (0.4 mg total) by mouth daily after supper.     Allergies:   Patient has no known allergies.   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   Tobacco Use   Smoking status: Former    Packs/day: 2.00    Years: 30.00    Additional pack years: 0.00    Total pack years: 60.00    Types: Cigarettes    Quit date: 06/17/1977    Years since quitting: 45.7   Smokeless tobacco: Never   Tobacco comments:    2 ppd, quit 1978  Vaping Use   Vaping Use: Never used  Substance and Sexual Activity   Alcohol use: Yes    Alcohol/week: 0.0 standard drinks of alcohol    Comment: socially    Drug use: No   Sexual activity: Yes  Other Topics Concern   Not on file  Social History Narrative   4 children (lost 1 son)   Wife has 2 children                Social Determinants of Health   Financial Resource Strain: Low Risk  (07/20/2021)   Overall Financial Resource Strain (CARDIA)    Difficulty of  Paying Living Expenses: Not hard at all  Food Insecurity: No Food Insecurity (02/21/2023)   Hunger Vital Sign    Worried About Running Out of Food in the Last Year: Never true    Ran Out of Food in the Last Year: Never true  Transportation Needs: No Transportation Needs (02/21/2023)   PRAPARE - Transportation  Lack of Transportation (Medical): No    Lack of Transportation (Non-Medical): No  Physical Activity: Inactive (07/20/2021)   Exercise Vital Sign    Days of Exercise per Week: 0 days    Minutes of Exercise per Session: 0 min  Stress: No Stress Concern Present (07/20/2021)   [employer] of Occupational Health - Occupational Stress Questionnaire    Feeling of Stress : Not at all  Social Connections: Socially Integrated (07/20/2021)   Social Connection and Isolation Panel [NHANES]    Frequency of Communication with Friends and Family: More than three times a week    Frequency of Social Gatherings with Friends and Family: More than three times a week    Attends Religious Services: More than 4 times per year    Active Member of [employer] or Organizations: Yes    Attends Engineer, structural: More than 4 times per year    Marital Status: Married     Family History: The patient's family history includes Diabetes in his maternal grandmother, paternal aunt, and another family member; Heart disease in his father; Hyperlipidemia in an other family member; Hypertension in his father; Prostate cancer in his brother; Stroke in his father. There is no history of Colon cancer.  ROS:   Please see the history of present illness.     All other systems reviewed and are negative.  EKGs/Labs/Other Studies Reviewed:    The following studies were reviewed today:  Echo 02/21/23: 1. Global hypokinesis worse in the septum and inferior wall. Left  ventricular ejection fraction, by estimation, is 40 to 45%. The left  ventricle has mildly decreased function. The left ventricle demonstrates   global hypokinesis. The left ventricular  internal cavity size was moderately dilated. Left ventricular diastolic  parameters are consistent with Grade I diastolic dysfunction (impaired  relaxation).   2. Right ventricular systolic function is normal. The right ventricular  size is normal.   3. Left atrial size was moderately dilated.   4. The mitral valve is abnormal. No evidence of mitral valve  regurgitation. No evidence of mitral stenosis.   5. Partial fusion of non and right cusps. The aortic valve is tricuspid.  There is mild calcification of the aortic valve. There is mild thickening  of the aortic valve. Aortic valve regurgitation is mild. Aortic valve  sclerosis is present, with no  evidence of aortic valve stenosis.   6. The inferior vena cava is normal in size with greater than 50%  respiratory variability, suggesting right atrial pressure of 3 mmHg.    Coronary stent intervention 01/12/22:   Prox LAD lesion is 90% stenosed.   A drug-eluting stent was successfully placed using a SYNERGY XD 4.0X20.   Post intervention, there is a 0% residual stenosis.   Prox RCA lesion is 75% stenosed.   A drug-eluting stent was successfully placed using a SYNERGY XD 3.0X16.   Post intervention, there is a 0% residual stenosis.   Successful PCI of the mid LAD with DES x 1 Successful PCI of the proximal RCA with DES x 1    EKG:  EKG is  ordered today.  The ekg ordered today demonstrates EKG Interpretation  Date/Time:  Tuesday March 05 2023 09:10:35 EDT Ventricular Rate:  61 PR Interval:  276 QRS Duration: 90 QT Interval:  424 QTC Calculation: 426 R Axis:   -5 Text Interpretation: Sinus rhythm with 1st degree A-V block Possible Anterior infarct , age undetermined When compared with ECG of 21-Feb-2023 02:57, PREVIOUS ECG  IS PRESENT Confirmed by Micah Flesher ([id number]) on 03/05/2023 9:27:02 AM   Recent Labs: 06/04/2022: TSH 2.64 02/21/2023: ALT 30; B Natriuretic Peptide 464.6 02/22/2023:  BUN 20; Creatinine, Ser 1.34; Hemoglobin 17.1; Platelets 207; Potassium 3.5; Sodium 138  Recent Lipid Panel    Component Value Date/Time   CHOL 117 01/12/2022 0344   TRIG 81 01/12/2022 0344   HDL 36 (L) 01/12/2022 0344   CHOLHDL 3.3 01/12/2022 0344   VLDL 16 01/12/2022 0344   LDLCALC 65 01/12/2022 0344   LDLDIRECT 55.0 01/01/2018 0808     Risk Assessment/Calculations:                Physical Exam:    VS:  BP 130/68 (BP Location: Left Arm, Patient Position: Sitting, Cuff Size: Normal)   Pulse 65   Ht 6\' 2"  (1.88 m)   Wt 224 lb (101.6 kg)   SpO2 93%   BMI 28.76 kg/m     Wt Readings from Last 3 Encounters:  03/05/23 224 lb (101.6 kg)  02/22/23 216 lb 7.9 oz (98.2 kg)  02/05/23 226 lb 12.8 oz (102.9 kg)     GEN:  Well nourished, well developed in no acute distress HEENT: Normal NECK: No JVD; No carotid bruits LYMPHATICS: No lymphadenopathy CARDIAC: RRR, no murmurs, rubs, gallops RESPIRATORY:  Clear to auscultation without rales, wheezing or rhonchi  ABDOMEN: Soft, non-tender, non-distended MUSCULOSKELETAL:  No edema; No deformity  SKIN: Warm and dry NEUROLOGIC:  Alert and oriented x 3 PSYCHIATRIC:  Normal affect   ASSESSMENT:    1. Coronary artery disease involving native coronary artery of native heart without angina pectoris   2. Preprocedural examination   3. Unstable angina (HCC)   4. CAD S/P percutaneous coronary angioplasty   5. Chronic combined systolic and diastolic heart failure (HCC)   6. Primary hypertension   7. Hyperlipidemia with target LDL less than 70    PLAN:    In order of problems listed above:  Chronic systolic and diastolic heart failure - newly recognized LVEF 40-45% - BNP 465 - given history of CAD, would consider ischemic evaluation - no chest pain at the time of last PCI in 2023 - GDMT: 25 mg jardiance, 40 mg lasix, 100 mg toprol, 24-26 mg entresto BID, potassium 20 mEq - needs BMP today   Hypertension  Managed in the  context of the above - home amlodipine stopped in the hospital   CAD - DES-RCA, DES-LAD 12/2021 - remains on DAPT with ASA and plavix - no chest pain - former smoker, on insulin - HS troponin 18 during recent hospitalization - note, CE negative at the time of his last PCI in 2023 - case discussed with Dr. Allyson Sabal, will proceed with LHC   Hyperlipidemia with LDL goal < 55 - given insulin use and prior tobacco use, would consider a lower LDL goal - never started on praluent - [facility]   Follow up in 2 weeks.     Informed Consent   Shared Decision Making/Informed Consent The risks [stroke (1 in 1000), death (1 in 1000), kidney failure [usually temporary] (1 in 500), bleeding (1 in 200), allergic reaction [possibly serious] (1 in 200)], benefits (diagnostic support and management of coronary artery disease) and alternatives of a cardiac catheterization were discussed in detail with Jonathan [name] and he is willing to proceed.       Medication Adjustments/Labs and Tests Ordered: Current medicines are reviewed at length with the patient  today.  Concerns regarding medicines are outlined above.  Orders Placed This Encounter  Procedures   Basic metabolic panel   Brain natriuretic peptide   CBC   EKG 12-Lead   No orders of the defined types were placed in this encounter.   Patient Instructions  Medication Instructions:   STOP Repatha  START Praluent (Alirocumab) 75 mg/mL inject 1 mL into skin every 14 days.   *If you need a refill on your cardiac medications before your next appointment, please call your pharmacy*  Lab Work: Your physician recommends that you have lab work TODAY:  BMP BNP CBC  If you have labs (blood work) drawn today and your tests are completely normal, you will receive your results only by: MyChart Message (if you have MyChart) OR A paper copy in the mail If you have any lab test that is abnormal or we need to change  your treatment, we will call you to review the results.  Testing/Procedures: Your physician has requested that you have a cardiac catheterization. Cardiac catheterization is used to diagnose and/or treat various heart conditions. Doctors may recommend this procedure for a number of different reasons. The most common reason is to evaluate chest pain. Chest pain can be a symptom of coronary artery disease (CAD), and cardiac catheterization can show whether plaque is narrowing or blocking your heart's arteries. This procedure is also used to evaluate the valves, as well as measure the blood flow and oxygen levels in different parts of your heart. For further information please visit [email or web address] Please follow instruction sheet, as given.  Scheduled for Wednesday 03/13/23 at [facility] at 10:00 AM   Follow-Up: At [facility], you and your health needs are our priority.  As part of our continuing mission to provide you with exceptional heart care, we have created designated Provider Care Teams.  These Care Teams include your primary Cardiologist (physician) and Advanced Practice Providers (APPs -  Physician Assistants and Nurse Practitioners) who all work together to provide you with the care you need, when you need it.   Your next appointment:   2 week(s)  Provider:   Micah Flesher, PA-C or APP        Other Instructions       Cardiac/Peripheral Catheterization   You are scheduled for a Cardiac Catheterization on Wednesday, June 26 with Dr. Peter Swaziland.  1. Please arrive at the [facility] (Main Entrance A) at [facility]: [address] [place], [place] [address] at 8:00 AM (This time is 2 hour(s) before your procedure to ensure your preparation). Free valet parking service is available. You will check in at ADMITTING. The support person will be asked to wait in the waiting room.  It is OK to have someone drop you off and come back when you are ready to be  discharged.        Special note: Every effort is made to have your procedure done on time. Please understand that emergencies sometimes delay scheduled procedures.  2. Diet: Do not eat solid foods after midnight.  You may have clear liquids until 5 AM the day of the procedure.  3. Labs: You will need to have blood drawn on Tuesday, June 18 at [facility] [address], [place]  Open: 8am - 5pm (Lunch 12:30 - 1:30)   Phone: [phone]. You do not need to be fasting.  4. Medication instructions in preparation for your procedure:   Contrast Allergy: No  Stop  taking, Entresto  Wednesday, June 26,, Lasix (Furosemide)  Wednesday, June 26,, Potassium  Wednesday, June 26, RESTART the day after procedure   Do not take Diabetes Med Glucophage (Metformin) on the day of the procedure and HOLD 48 HOURS AFTER THE PROCEDURE.  On the morning of your procedure, take Aspirin 81 mg and Plavix/Clopidogrel and any morning medicines NOT listed above.  You may use sips of water.  5. Plan to go home the same day, you will only stay overnight if medically necessary. 6. You MUST have a responsible adult to drive you home. 7. An adult MUST be with you the first 24 hours after you arrive home. 8. Bring a current list of your medications, and the last time and date medication taken. 9. Bring ID and current insurance cards. 10.Please wear clothes that are easy to get on and off and wear slip-on shoes.  Thank you for allowing [place] to care for you!   -- [facility] Invasive Cardiovascular services     Signed, Marcelino Duster, [place]  03/05/2023 4:46 PM     [facility]

## 2023-03-05 ENCOUNTER — Encounter: Payer: Self-pay | Admitting: Physician Assistant

## 2023-03-05 ENCOUNTER — Other Ambulatory Visit: Payer: Self-pay | Admitting: Pharmacist

## 2023-03-05 ENCOUNTER — Ambulatory Visit: Payer: Medicare Other | Attending: Physician Assistant | Admitting: Physician Assistant

## 2023-03-05 VITALS — BP 130/68 | HR 65 | Ht 74.0 in | Wt 224.0 lb

## 2023-03-05 DIAGNOSIS — I2510 Atherosclerotic heart disease of native coronary artery without angina pectoris: Secondary | ICD-10-CM | POA: Diagnosis not present

## 2023-03-05 DIAGNOSIS — Z01818 Encounter for other preprocedural examination: Secondary | ICD-10-CM | POA: Diagnosis not present

## 2023-03-05 DIAGNOSIS — I200 Unstable angina: Secondary | ICD-10-CM | POA: Diagnosis not present

## 2023-03-05 DIAGNOSIS — E782 Mixed hyperlipidemia: Secondary | ICD-10-CM

## 2023-03-05 DIAGNOSIS — Z9861 Coronary angioplasty status: Secondary | ICD-10-CM | POA: Diagnosis not present

## 2023-03-05 DIAGNOSIS — I739 Peripheral vascular disease, unspecified: Secondary | ICD-10-CM

## 2023-03-05 DIAGNOSIS — I10 Essential (primary) hypertension: Secondary | ICD-10-CM | POA: Insufficient documentation

## 2023-03-05 DIAGNOSIS — I5042 Chronic combined systolic (congestive) and diastolic (congestive) heart failure: Secondary | ICD-10-CM | POA: Insufficient documentation

## 2023-03-05 DIAGNOSIS — I25110 Atherosclerotic heart disease of native coronary artery with unstable angina pectoris: Secondary | ICD-10-CM | POA: Diagnosis not present

## 2023-03-05 DIAGNOSIS — E785 Hyperlipidemia, unspecified: Secondary | ICD-10-CM | POA: Diagnosis not present

## 2023-03-05 LAB — BASIC METABOLIC PANEL
BUN/Creatinine Ratio: 11 (ref 10–24)
Calcium: 9.7 mg/dL (ref 8.6–10.2)
Chloride: 106 mmol/L (ref 96–106)
Potassium: 4.6 mmol/L (ref 3.5–5.2)

## 2023-03-05 LAB — CBC

## 2023-03-05 MED ORDER — PRALUENT 75 MG/ML ~~LOC~~ SOAJ
75.0000 mg | SUBCUTANEOUS | 3 refills | Status: DC
Start: 2023-03-05 — End: 2023-03-05

## 2023-03-05 MED ORDER — PRALUENT 75 MG/ML ~~LOC~~ SOAJ
75.0000 mg | SUBCUTANEOUS | 3 refills | Status: DC
Start: 2023-03-05 — End: 2023-03-20

## 2023-03-05 NOTE — Telephone Encounter (Signed)
Patient here seeing [name]. [facility]. Resent to [place] and printed paper copy and Dr Allyson Sabal sign. Given to patient to bring to [place].

## 2023-03-05 NOTE — Patient Instructions (Addendum)
Medication Instructions:   STOP Repatha  START Praluent (Alirocumab) 75 mg/mL inject 1 mL into skin every 14 days.   *If you need a refill on your cardiac medications before your next appointment, please call your pharmacy*  Lab Work: Your physician recommends that you have lab work TODAY:  BMP BNP CBC  If you have labs (blood work) drawn today and your tests are completely normal, you will receive your results only by: MyChart Message (if you have MyChart) OR A paper copy in the mail If you have any lab test that is abnormal or we need to change your treatment, we will call you to review the results.  Testing/Procedures: Your physician has requested that you have a cardiac catheterization. Cardiac catheterization is used to diagnose and/or treat various heart conditions. Doctors may recommend this procedure for a number of different reasons. The most common reason is to evaluate chest pain. Chest pain can be a symptom of coronary artery disease (CAD), and cardiac catheterization can show whether plaque is narrowing or blocking your heart's arteries. This procedure is also used to evaluate the valves, as well as measure the blood flow and oxygen levels in different parts of your heart. For further information please visit [email or web address] Please follow instruction sheet, as given.  Scheduled for Wednesday 03/13/23 at [facility] at 10:00 AM   Follow-Up: At [facility], you and your health needs are our priority.  As part of our continuing mission to provide you with exceptional heart care, we have created designated Provider Care Teams.  These Care Teams include your primary Cardiologist (physician) and Advanced Practice Providers (APPs -  Physician Assistants and Nurse Practitioners) who all work together to provide you with the care you need, when you need it.   Your next appointment:   2 week(s)  Provider:   Micah Flesher, PA-C or APP        Other  Instructions       Cardiac/Peripheral Catheterization   You are scheduled for a Cardiac Catheterization on Wednesday, June 26 with Dr. Peter Swaziland.  1. Please arrive at the [facility] (Main Entrance A) at [facility]: [address] [place], [place] [address] at 8:00 AM (This time is 2 hour(s) before your procedure to ensure your preparation). Free valet parking service is available. You will check in at ADMITTING. The support person will be asked to wait in the waiting room.  It is OK to have someone drop you off and come back when you are ready to be discharged.        Special note: Every effort is made to have your procedure done on time. Please understand that emergencies sometimes delay scheduled procedures.  2. Diet: Do not eat solid foods after midnight.  You may have clear liquids until 5 AM the day of the procedure.  3. Labs: You will need to have blood drawn on Tuesday, June 18 at [facility] [address], [place]  Open: 8am - 5pm (Lunch 12:30 - 1:30)   Phone: [phone]. You do not need to be fasting.  4. Medication instructions in preparation for your procedure:   Contrast Allergy: No  Stop taking, Entresto  Wednesday, June 26,, Lasix (Furosemide)  Wednesday, June 26,, Potassium  Wednesday, June 26, RESTART the day after procedure   Do not take Diabetes Med Glucophage (Metformin) on the day of the procedure and HOLD 48 HOURS AFTER THE PROCEDURE.  On the morning of your procedure,  take Aspirin 81 mg and Plavix/Clopidogrel and any morning medicines NOT listed above.  You may use sips of water.  5. Plan to go home the same day, you will only stay overnight if medically necessary. 6. You MUST have a responsible adult to drive you home. 7. An adult MUST be with you the first 24 hours after you arrive home. 8. Bring a current list of your medications, and the last time and date medication taken. 9. Bring ID and current insurance cards. 10.Please wear  clothes that are easy to get on and off and wear slip-on shoes.  Thank you for allowing [place] to care for you!   -- [place] [facility] Cardi[facility] services

## 2023-03-06 ENCOUNTER — Telehealth: Payer: Self-pay

## 2023-03-06 ENCOUNTER — Ambulatory Visit (INDEPENDENT_AMBULATORY_CARE_PROVIDER_SITE_OTHER): Payer: Medicare Other | Admitting: Internal Medicine

## 2023-03-06 ENCOUNTER — Encounter: Payer: Self-pay | Admitting: Internal Medicine

## 2023-03-06 VITALS — BP 136/76 | HR 65 | Temp 98.1°F | Resp 18 | Ht 74.0 in | Wt 226.0 lb

## 2023-03-06 DIAGNOSIS — I5042 Chronic combined systolic (congestive) and diastolic (congestive) heart failure: Secondary | ICD-10-CM | POA: Diagnosis not present

## 2023-03-06 LAB — BASIC METABOLIC PANEL
BUN: 17 mg/dL (ref 8–27)
CO2: 25 mmol/L (ref 20–29)
Creatinine, Ser: 1.48 mg/dL — ABNORMAL HIGH (ref 0.76–1.27)
Glucose: 98 mg/dL (ref 70–99)
Sodium: 144 mmol/L (ref 134–144)
eGFR: 46 mL/min/{1.73_m2} — ABNORMAL LOW (ref >59)

## 2023-03-06 LAB — CBC
Hemoglobin: 16.7 g/dL (ref 13.0–17.7)
MCHC: 32.7 g/dL (ref 31.5–35.7)
Platelets: 258 10*3/uL (ref 150–450)
RDW: 13.7 % (ref 11.6–15.4)
WBC: 7.5 10*3/uL (ref 3.4–10.8)

## 2023-03-06 LAB — BRAIN NATRIURETIC PEPTIDE: BNP: 575 pg/mL — ABNORMAL HIGH (ref 0.0–100.0)

## 2023-03-06 NOTE — Progress Notes (Signed)
Subjective:    Patient ID: Jonathan Beath., male    DOB: 1936-08-21, 87 y.o.   MRN: [id number]  DOS:  03/06/2023 Type of visit - description: Hospital follow-up  Discharged 02/22/2023. Presented with shortness of breath, orthopnea. Dx - Acute   CHF.  Found to be overloaded, EF 40 to 45%, symptoms improved with Lasix IV x 2.  Raford Pitcher, stopped amlodipine. -Already saw cardiology for outpatient follow-up. -COPD felt to be stable during the admission.  Since he left the hospital is feeling well. No fever or chills No chest pain O2 sat today 92%, no shortness of breath or cough.  Review of Systems See above   Past Medical History:  Diagnosis Date   CAD (coronary artery disease)    Two RCA stents remotely / 3rd RCA stent 2006   Colon polyps    s/p several Cscopes.   COPD (chronic obstructive pulmonary disease) (HCC)    on O2, nocturnal   Diabetes mellitus    dx aprox 2009   ED (erectile dysfunction)    has a vacumm device   Ejection fraction    Hyperlipidemia    dx in 90s   Hypertension    dx in the 66   Hypogonadism male    PVD (peripheral vascular disease) (HCC)    s/p stents at LE 2009, Dr Jacinto Halim   Shortness of breath    O2 Sat dropped to 82% walking on the treadmill, September, 2012    Past Surgical History:  Procedure Laterality Date   APPENDECTOMY     CORONARY STENT INTERVENTION N/A 01/12/2022   Procedure: CORONARY STENT INTERVENTION;  Surgeon: [place], Peter M, MD;  Location: [facility] INVASIVE CV LAB;  Service: Cardiovascular;  Laterality: N/A;   RIGHT/LEFT HEART CATH AND CORONARY ANGIOGRAPHY N/A 01/11/2022   Procedure: RIGHT/LEFT HEART CATH AND CORONARY ANGIOGRAPHY;  Surgeon: Lyn Records, MD;  Location: [facility] INVASIVE CV LAB;  Service: Cardiovascular;  Laterality: N/A;   TONSILLECTOMY      Current Outpatient Medications  Medication Instructions   albuterol (VENTOLIN HFA) 108 (90 Base) MCG/ACT inhaler USE 1 TO 2 INHALATIONS EVERY 6 HOURS AS NEEDED FOR  WHEEZING OR SHORTNESS OF BREATH   aspirin 81 mg, Oral, Daily,     Blood Glucose Monitoring Suppl (FREESTYLE FREEDOM LITE) w/Device KIT Use Freestyle Freedom Lite meter to check blood sugar twice daily. DX:E11.65   clopidogrel (PLAVIX) 75 mg, Oral, Daily with breakfast   Dulaglutide (TRULICITY) 3 MG/0.5ML SOPN INJECT 3 MG UNDER THE SKIN ONCE A WEEK   empagliflozin (JARDIANCE) 25 mg, Oral, Daily before breakfast, For diabetes E11.65 and E 11.29   furosemide (LASIX) 40 mg, Oral, Daily   gabapentin (NEURONTIN) 100 MG capsule TAKE TWO CAPSULES BY MOUTH TWICE A DAY FOR NEUROPATHY   glucose blood (FREESTYLE LITE) test strip USE AS INSTRUCTED   hydrOXYzine (ATARAX) 12.5-25 mg, Oral, Every 8 hours PRN   Insulin Pen Needle (SURE COMFORT PEN NEEDLES) 31G X 6 MM MISC Use as directed   metFORMIN (GLUCOPHAGE) 1000 MG tablet Take 0.5 tablet in am and 1 whole one at dinner.   metoprolol succinate (TOPROL XL) 100 mg, Oral, Daily   Multiple Vitamin (MULTIVITAMIN WITH MINERALS) TABS tablet 1 tablet, Oral, Daily   ondansetron (ZOFRAN) 4 mg, Oral, Every 8 hours PRN   OXYGEN 2 L/min, Inhalation, Daily at bedtime, PRN during day   potassium chloride SA (KLOR-CON M) 20 MEQ tablet 20 mEq, Oral, Daily   Praluent 75 mg, Subcutaneous, Every  14 days   sacubitril-valsartan (ENTRESTO) 24-26 MG 1 tablet, Oral, 2 times daily   simvastatin (ZOCOR) 20 mg, Oral, Daily   tamsulosin (FLOMAX) 0.4 mg, Oral, Daily after supper       Objective:   Physical Exam BP 136/76   Pulse 65   Temp 98.1 F (36.7 C) (Oral)   Resp 18   Ht 6\' 2"  (1.88 m)   Wt 226 lb (102.5 kg)   SpO2 92%   BMI 29.02 kg/m  General:   Well developed, NAD, BMI noted. HEENT:  Normocephalic . Face symmetric, atraumatic Lungs:  CTA B Normal respiratory effort, no intercostal retractions, no accessory muscle use. Heart: RRR,  no murmur.  Lower extremities: no pretibial edema bilaterally  Skin: Not pale. Not jaundice Neurologic:  alert & oriented  X3.  Speech normal, gait appropriate for age and unassisted Psych--  Cognition and judgment appear intact.  Cooperative with normal attention span and concentration.  Behavior appropriate. No anxious or depressed appearing.      Assessment      Assessment DM dx ~2009, complicated by CAD, PVD, mild DM neuropathy. Sees Dr. Lucianne Muss Hypogonadism. Sees Dr. Lucianne Muss HTN Hyperlipidemia COPD, nocturnal O2 prn Back pain, chronic: See visit 10/12/2016 CV: -CAD, Dr. Myrtis Ser Dr [place]ton See S/p  stenting 2 to the RCA remote past and the third RCA stent in  2006, negative stress test in 2012, LVEF 55% on echo 2015 -Peripheral vascular disease, stents lower extremity 2009  ED Polycythemia: Saw hematology 11-2018, felt to be due to chronic respiratory failure.  Not likely polycythemia vera  PLAN    Chronic systolic and diastolic heart failure: Newly recognized, admitted to the hospital few days ago, BNP 465, EF 40 to 45%. Cardiology recommended to continue Jardiance, Lasix, metoprolol and  started Entresto, K+ supplements. Amlodipine discontinued. Scheduled for a cardiac catheterization as an outpatient. Hospital follow-up BMP done at cardiologist yesterday. Plan: Encourage daily weight and call cardiology if weight increases.  Otherwise plans per cardiology. RTC as scheduled for next month

## 2023-03-06 NOTE — Telephone Encounter (Signed)
Lmom to discuss lab results and recommendations per Micah Flesher PA.

## 2023-03-06 NOTE — Patient Instructions (Addendum)
Weight yourself at home daily. If you notice  increasing weight of more than 4 to 5 pounds in a week: Call your heart doctors.  Please bring or send [place] a copy of your Healthcare Power of [place].   Check the  blood pressure regularly BP GOAL is between 110/65 and  135/85. If it is consistently higher or lower, let me know   See you next month

## 2023-03-06 NOTE — Assessment & Plan Note (Signed)
Chronic systolic and diastolic heart failure: Newly recognized, admitted to the hospital few days ago, BNP 465, EF 40 to 45%. Cardiology recommended to continue Jardiance, Lasix, metoprolol and  started Entresto, K+ supplements. Amlodipine discontinued. Scheduled for a cardiac catheterization as an outpatient. Hospital follow-up BMP done at cardiologist yesterday. Plan: Encourage daily weight and call cardiology if weight increases.  Otherwise plans per cardiology. RTC as scheduled for next month

## 2023-03-07 ENCOUNTER — Telehealth: Payer: Self-pay | Admitting: *Deleted

## 2023-03-07 NOTE — Telephone Encounter (Signed)
Cardiac Catheterization scheduled at [facility] for: Wednesday March 13, 2023 10 AM Arrival time [facility] Main Entrance A at: 7 AM-pt needs BMP  Nothing to eat after midnight prior to procedure, clear liquids until 5 AM day of procedure.  Medication instructions: -Hold:  Metformin-day of procedure and 48 hours post procedure  Jardiance-AM of procedure  Trulicity-weekly on Saturdays-pt will hold until post procedure  Lasix/KCl-day before and day of procedure-per protocol GFR 46  Pt reports he has stopped taking Entresto. -Other usual morning medications can be taken with sips of water including aspirin 81 mg and Plavix 75 mg.  Confirmed patient has responsible adult to drive home post procedure and be with patient first 24 hours after arriving home.  Plan to go home the same day, you will only stay overnight if medically necessary.  Reviewed procedure instructions with patient.

## 2023-03-08 ENCOUNTER — Telehealth: Payer: Self-pay

## 2023-03-08 DIAGNOSIS — N401 Enlarged prostate with lower urinary tract symptoms: Secondary | ICD-10-CM | POA: Diagnosis not present

## 2023-03-08 DIAGNOSIS — R351 Nocturia: Secondary | ICD-10-CM | POA: Diagnosis not present

## 2023-03-08 NOTE — Patient Outreach (Signed)
  Care Coordination   03/08/2023 Name: Skip Litke. MRN: [id number] DOB: [birth year]   Care Coordination Outreach Attempts:  An unsuccessful telephone outreach was attempted for a scheduled appointment today.  Follow Up Plan:  Additional outreach attempts will be made to offer the patient care coordination information and services.   Encounter Outcome:  No Answer   Care Coordination Interventions:  No, not indicated    Kathyrn Sheriff, RN, MSN, BSN, CCM [facility] Care Coordinator [phone]

## 2023-03-13 ENCOUNTER — Encounter (HOSPITAL_COMMUNITY)
Admission: RE | Disposition: A | Discharge status: Home / Self Care | Payer: Self-pay | Source: Home / Self Care | Attending: Cardiology

## 2023-03-13 ENCOUNTER — Ambulatory Visit (HOSPITAL_COMMUNITY)
Admission: RE | Admit: 2023-03-13 | Discharge: 2023-03-13 | Disposition: A | Discharge status: Home / Self Care | Payer: Medicare Other | Attending: Cardiology | Admitting: Cardiology

## 2023-03-13 ENCOUNTER — Other Ambulatory Visit: Payer: Self-pay

## 2023-03-13 DIAGNOSIS — E1151 Type 2 diabetes mellitus with diabetic peripheral angiopathy without gangrene: Secondary | ICD-10-CM | POA: Insufficient documentation

## 2023-03-13 DIAGNOSIS — Z006 Encounter for examination for normal comparison and control in clinical research program: Secondary | ICD-10-CM

## 2023-03-13 DIAGNOSIS — J449 Chronic obstructive pulmonary disease, unspecified: Secondary | ICD-10-CM | POA: Insufficient documentation

## 2023-03-13 DIAGNOSIS — Z87891 Personal history of nicotine dependence: Secondary | ICD-10-CM | POA: Insufficient documentation

## 2023-03-13 DIAGNOSIS — I5022 Chronic systolic (congestive) heart failure: Secondary | ICD-10-CM

## 2023-03-13 DIAGNOSIS — D751 Secondary polycythemia: Secondary | ICD-10-CM | POA: Insufficient documentation

## 2023-03-13 DIAGNOSIS — Z79899 Other long term (current) drug therapy: Secondary | ICD-10-CM | POA: Insufficient documentation

## 2023-03-13 DIAGNOSIS — E785 Hyperlipidemia, unspecified: Secondary | ICD-10-CM | POA: Insufficient documentation

## 2023-03-13 DIAGNOSIS — Z7984 Long term (current) use of oral hypoglycemic drugs: Secondary | ICD-10-CM | POA: Diagnosis not present

## 2023-03-13 DIAGNOSIS — R0902 Hypoxemia: Secondary | ICD-10-CM | POA: Diagnosis not present

## 2023-03-13 DIAGNOSIS — I200 Unstable angina: Secondary | ICD-10-CM

## 2023-03-13 DIAGNOSIS — Z955 Presence of coronary angioplasty implant and graft: Secondary | ICD-10-CM | POA: Diagnosis not present

## 2023-03-13 DIAGNOSIS — I2510 Atherosclerotic heart disease of native coronary artery without angina pectoris: Secondary | ICD-10-CM | POA: Diagnosis not present

## 2023-03-13 DIAGNOSIS — Z7985 Long-term (current) use of injectable non-insulin antidiabetic drugs: Secondary | ICD-10-CM | POA: Insufficient documentation

## 2023-03-13 DIAGNOSIS — I110 Hypertensive heart disease with heart failure: Secondary | ICD-10-CM | POA: Diagnosis not present

## 2023-03-13 DIAGNOSIS — I5042 Chronic combined systolic (congestive) and diastolic (congestive) heart failure: Secondary | ICD-10-CM | POA: Insufficient documentation

## 2023-03-13 DIAGNOSIS — R0609 Other forms of dyspnea: Secondary | ICD-10-CM | POA: Diagnosis not present

## 2023-03-13 DIAGNOSIS — I25110 Atherosclerotic heart disease of native coronary artery with unstable angina pectoris: Secondary | ICD-10-CM | POA: Diagnosis not present

## 2023-03-13 HISTORY — PX: LEFT HEART CATH AND CORONARY ANGIOGRAPHY: CATH118249

## 2023-03-13 LAB — BASIC METABOLIC PANEL
Anion gap: 14 (ref 5–15)
BUN: 21 mg/dL (ref 8–23)
CO2: 25 mmol/L (ref 22–32)
Calcium: 9.5 mg/dL (ref 8.9–10.3)
Chloride: 102 mmol/L (ref 98–111)
Creatinine, Ser: 1.52 mg/dL — ABNORMAL HIGH (ref 0.61–1.24)
GFR, Estimated: 45 mL/min — ABNORMAL LOW (ref >60)
Glucose, Bld: 109 mg/dL — ABNORMAL HIGH (ref 70–99)
Potassium: 4.5 mmol/L (ref 3.5–5.1)
Sodium: 141 mmol/L (ref 135–145)

## 2023-03-13 LAB — GLUCOSE, CAPILLARY: Glucose-Capillary: 109 mg/dL — ABNORMAL HIGH (ref 70–99)

## 2023-03-13 SURGERY — LEFT HEART CATH AND CORONARY ANGIOGRAPHY
Anesthesia: LOCAL

## 2023-03-13 MED ORDER — SODIUM CHLORIDE 0.9 % WEIGHT BASED INFUSION: 1.0000 mL/kg/h | INTRAVENOUS | Status: DC

## 2023-03-13 MED ORDER — HEPARIN SODIUM (PORCINE) 1000 UNIT/ML IJ SOLN
INTRAMUSCULAR | Status: AC
  Filled 2023-03-13: qty 10

## 2023-03-13 MED ORDER — HYDRALAZINE HCL 20 MG/ML IJ SOLN: 10.0000 mg | INTRAMUSCULAR | Status: DC | PRN

## 2023-03-13 MED ORDER — MIDAZOLAM HCL 2 MG/2ML IJ SOLN
INTRAMUSCULAR | Status: AC
  Filled 2023-03-13: qty 2

## 2023-03-13 MED ORDER — SODIUM CHLORIDE 0.9 % IV SOLN: 250.0000 mL | INTRAVENOUS | Status: DC | PRN

## 2023-03-13 MED ORDER — SODIUM CHLORIDE 0.9 % IV SOLN: INTRAVENOUS | Status: DC

## 2023-03-13 MED ORDER — SODIUM CHLORIDE 0.9% FLUSH: 3.0000 mL | Freq: Two times a day (BID) | INTRAVENOUS | Status: DC

## 2023-03-13 MED ORDER — SODIUM CHLORIDE 0.9% FLUSH: 3.0000 mL | INTRAVENOUS | Status: DC | PRN

## 2023-03-13 MED ORDER — METFORMIN HCL 1000 MG PO TABS: 1000.0000 mg | ORAL_TABLET | Freq: Every day | ORAL | Status: DC

## 2023-03-13 MED ORDER — ASPIRIN 81 MG PO CHEW: 81.0000 mg | CHEWABLE_TABLET | ORAL | Status: DC

## 2023-03-13 MED ORDER — FENTANYL CITRATE (PF) 100 MCG/2ML IJ SOLN
INTRAMUSCULAR | Status: AC
  Filled 2023-03-13: qty 2

## 2023-03-13 MED ORDER — ACETAMINOPHEN 325 MG PO TABS: 650.0000 mg | ORAL_TABLET | ORAL | Status: DC | PRN

## 2023-03-13 MED ORDER — LIDOCAINE HCL (PF) 1 % IJ SOLN
INTRAMUSCULAR | Status: DC | PRN
  Administered 2023-03-13: 5 mL via INTRADERMAL

## 2023-03-13 MED ORDER — IOHEXOL 350 MG/ML SOLN
INTRAVENOUS | Status: DC | PRN
  Administered 2023-03-13: 45 mL

## 2023-03-13 MED ORDER — LIDOCAINE HCL (PF) 1 % IJ SOLN
INTRAMUSCULAR | Status: AC
  Filled 2023-03-13: qty 30

## 2023-03-13 MED ORDER — VERAPAMIL HCL 2.5 MG/ML IV SOLN
INTRAVENOUS | Status: DC | PRN
  Administered 2023-03-13: 10 mL via INTRA_ARTERIAL

## 2023-03-13 MED ORDER — HEPARIN (PORCINE) IN NACL 1000-0.9 UT/500ML-% IV SOLN
INTRAVENOUS | Status: DC | PRN
  Administered 2023-03-13: 1000 mL

## 2023-03-13 MED ORDER — HEPARIN SODIUM (PORCINE) 1000 UNIT/ML IJ SOLN
INTRAMUSCULAR | Status: DC | PRN
  Administered 2023-03-13: 5000 [IU] via INTRAVENOUS

## 2023-03-13 MED ORDER — MIDAZOLAM HCL 2 MG/2ML IJ SOLN
INTRAMUSCULAR | Status: DC | PRN
  Administered 2023-03-13: 1 mg via INTRAVENOUS

## 2023-03-13 MED ORDER — VERAPAMIL HCL 2.5 MG/ML IV SOLN
INTRAVENOUS | Status: AC
  Filled 2023-03-13: qty 2

## 2023-03-13 MED ORDER — ONDANSETRON HCL 4 MG/2ML IJ SOLN: 4.0000 mg | Freq: Four times a day (QID) | INTRAMUSCULAR | Status: DC | PRN

## 2023-03-13 MED ORDER — FENTANYL CITRATE (PF) 100 MCG/2ML IJ SOLN
INTRAMUSCULAR | Status: DC | PRN
  Administered 2023-03-13: 25 ug via INTRAVENOUS

## 2023-03-13 SURGICAL SUPPLY — 10 items
CATH INFINITI 5FR MULTPACK ANG (CATHETERS) IMPLANT
DEVICE RAD COMP TR BAND LRG (VASCULAR PRODUCTS) IMPLANT
GLIDESHEATH SLEND SS 6F .021 (SHEATH) IMPLANT
GUIDEWIRE INQWIRE 1.5J.035X260 (WIRE) IMPLANT
INQWIRE 1.5J .035X260CM (WIRE) ×1
KIT HEART LEFT (KITS) ×1 IMPLANT
PACK CARDIAC CATHETERIZATION (CUSTOM PROCEDURE TRAY) ×1 IMPLANT
SHEATH PROBE COVER 6X72 (BAG) IMPLANT
TRANSDUCER W/STOPCOCK (MISCELLANEOUS) ×1 IMPLANT
TUBING CIL FLEX 10 FLL-RA (TUBING) ×1 IMPLANT

## 2023-03-13 NOTE — Research (Signed)
SELUTION Informed Consent   Subject Name: Jonathan [name].  Subject met inclusion and exclusion criteria.  The informed consent form, study requirements and expectations were reviewed with the subject and questions and concerns were addressed prior to the signing of the consent form.  The subject verbalized understanding of the trial requirements.  The subject agreed to participate in the Selution trial and signed the informed consent on 26/Jun/2024.  The informed consent was obtained prior to performance of any protocol-specific procedures for the subject.  A copy of the signed informed consent was given to the subject and a copy was placed in the subject's medical record.   Kandis Mannan Michalle Rademaker

## 2023-03-13 NOTE — Interval H&P Note (Signed)
History and Physical Interval Note:  03/13/2023 7:45 AM  Jonathan [name].  has presented today for surgery, with the diagnosis of unstable angina - cad.  The various methods of treatment have been discussed with the patient and family. After consideration of risks, benefits and other options for treatment, the patient has consented to  Procedure(s): LEFT HEART CATH AND CORONARY ANGIOGRAPHY (N/A) as a surgical intervention.  The patient's history has been reviewed, patient examined, no change in status, stable for surgery.  I have reviewed the patient's chart and labs.  Questions were answered to the patient's satisfaction.   Cath Lab Visit (complete for each Cath Lab visit)  Clinical Evaluation Leading to the Procedure:   ACS: No.  Non-ACS:    Anginal Classification: CCS II  Anti-ischemic medical therapy: Minimal Therapy (1 class of medications)  Non-Invasive Test Results: No non-invasive testing performed  Prior CABG: No previous CABG        Theron Arista [facility] 03/13/2023 7:45 AM

## 2023-03-13 NOTE — Discharge Instructions (Signed)

## 2023-03-14 ENCOUNTER — Encounter (HOSPITAL_COMMUNITY): Payer: Self-pay | Admitting: Cardiology

## 2023-03-19 NOTE — Progress Notes (Signed)
Cardiology Clinic Note   Patient Name: Jonathan [name]. Date of Encounter: 03/20/2023  Primary Care Provider:  Wanda Plump, [name] Primary Cardiologist:  Jonathan Batty, [name]  Patient Profile    87 year old male with history of coronary artery disease, stent to the right coronary artery in 2006, DES to RCA DES to LAD on 01/04/2022, residual left circumflex disease treated medically, chronic systolic diastolic heart failure, echo 03/07/2023 LVEF 40 to 45% with grade 1 diastolic dysfunction and moderate LAE.  Chronic systolic diastolic heart failure, on GDMT, hypertension.  Last seen in the office on 03/05/2023 the patient was planned for left heart catheterization.  LHC was performed on 03/13/2023 revealing nonobstructive CAD, with continued patency of stents in the LAD and RCA, mildly elevated LVEDP at 22 mmHg.  Past Medical History    Past Medical History:  Diagnosis Date   CAD (coronary artery disease)    Two RCA stents remotely / 3rd RCA stent 2006   Colon polyps    s/p several Cscopes.   COPD (chronic obstructive pulmonary disease) (HCC)    on O2, nocturnal   Diabetes mellitus    dx aprox 2009   ED (erectile dysfunction)    has a vacumm device   Ejection fraction    Hyperlipidemia    dx in 90s   Hypertension    dx in the 54   Hypogonadism male    PVD (peripheral vascular disease) (HCC)    s/p stents at LE 2009, Jonathan [name]   Shortness of breath    O2 Sat dropped to 82% walking on the treadmill, September, 2012   Past Surgical History:  Procedure Laterality Date   APPENDECTOMY     CORONARY STENT INTERVENTION N/A 01/12/2022   Procedure: CORONARY STENT INTERVENTION;  Surgeon: [name], Jonathan [name];  Location: [facility] INVASIVE CV LAB;  Service: Cardiovascular;  Laterality: N/A;   LEFT HEART CATH AND CORONARY ANGIOGRAPHY N/A 03/13/2023   Procedure: LEFT HEART CATH AND CORONARY ANGIOGRAPHY;  Surgeon: [name], Jonathan [name];  Location: [facility] INVASIVE CV LAB;  Service: Cardiovascular;  Laterality:  N/A;   RIGHT/LEFT HEART CATH AND CORONARY ANGIOGRAPHY N/A 01/11/2022   Procedure: RIGHT/LEFT HEART CATH AND CORONARY ANGIOGRAPHY;  Surgeon: Lyn Records, [name];  Location: [facility] INVASIVE CV LAB;  Service: Cardiovascular;  Laterality: N/A;   TONSILLECTOMY      Allergies  No Known Allergies  History of Present Illness    Jonathan [name] comes today on follow-up after having had cardiac catheterization as described above.  Patient's existing stents were without obstruction, he had some distal LAD obstruction of 40%.  He is more short of breath today.  His oxygen level did drop into the high 80s when checked when he first arrived in the clinic room, the O2 sat increased after he had been resting well and sitting upright.  He admits to breathing a little harder lately.  He is taking care of an invalid wife and feels like it is stress related.  He does have a history of COPD and wears oxygen at night via basic nasal cannula, and some during the day when he is short of breath.  He does have inhalers which she has not been using regularly.  He is followed by Jonathan. Jetty [name], pulmonologist, but has not seen him in several months.  Home Medications    Current Outpatient Medications  Medication Sig Dispense Refill   albuterol (VENTOLIN HFA) 108 (90 Base) MCG/ACT inhaler USE 1 TO 2 INHALATIONS EVERY  6 HOURS AS NEEDED FOR WHEEZING OR SHORTNESS OF BREATH 51 g 3   aspirin 81 MG tablet Take 81 mg by mouth daily.     Blood Glucose Monitoring Suppl (FREESTYLE FREEDOM LITE) w/Device KIT Use Freestyle Freedom Lite meter to check blood sugar twice daily. DX:E11.65 1 kit 0   clopidogrel (PLAVIX) 75 MG tablet Take 1 tablet (75 mg total) by mouth daily with breakfast. 90 tablet 1   Dulaglutide (TRULICITY) 3 MG/0.5ML SOPN INJECT 3 MG UNDER THE SKIN ONCE A WEEK (Patient taking differently: Inject 3 mg into the skin every Saturday.) 6 mL 3   empagliflozin (JARDIANCE) 25 MG TABS tablet Take 1 tablet (25 mg total) by mouth  daily before breakfast. For diabetes E11.65 and E 11.29 30 tablet 2   furosemide (LASIX) 40 MG tablet Take 1 tablet (40 mg total) by mouth daily. 30 tablet 0   gabapentin (NEURONTIN) 100 MG capsule TAKE TWO CAPSULES BY MOUTH TWICE A DAY FOR NEUROPATHY     glucose blood (FREESTYLE LITE) test strip USE AS INSTRUCTED 100 strip 11   hydrOXYzine (ATARAX) 25 MG tablet Take 0.5-1 tablets (12.5-25 mg total) by mouth every 8 (eight) hours as needed for anxiety. 30 tablet 1   Insulin Pen Needle (SURE COMFORT PEN NEEDLES) 31G X 6 MM MISC Use as directed 100 each 3   metFORMIN (GLUCOPHAGE) 1000 MG tablet Take 1 tablet (1,000 mg total) by mouth daily with breakfast.     metoprolol (TOPROL-XL) 200 MG 24 hr tablet Take 100 mg by mouth daily.     Multiple Vitamin (MULTIVITAMIN WITH MINERALS) TABS tablet Take 1 tablet by mouth daily.     ondansetron (ZOFRAN) 4 MG tablet Take 1 tablet (4 mg total) by mouth every 8 (eight) hours as needed for nausea or vomiting. 20 tablet 0   OXYGEN Inhale 2 L/min into the lungs at bedtime. PRN during day     potassium chloride SA (KLOR-CON M) 20 MEQ tablet Take 1 tablet (20 mEq total) by mouth daily. 30 tablet 0   sacubitril-valsartan (ENTRESTO) 24-26 MG Take 1 tablet by mouth 2 (two) times daily. 60 tablet 0   simvastatin (ZOCOR) 20 MG tablet Take 20 mg by mouth daily.     tamsulosin (FLOMAX) 0.4 MG CAPS capsule Take 1 capsule (0.4 mg total) by mouth daily after supper. 90 capsule 1   Alirocumab (PRALUENT) 75 MG/ML SOAJ Inject 1 mL (75 mg total) into the skin every 14 (fourteen) days. 6 mL 3   No current facility-administered medications for this visit.     Family History    Family History  Problem Relation Age of Onset   Heart disease Father    Hypertension Father    Stroke Father    Diabetes Paternal Aunt    Diabetes Maternal Grandmother    Diabetes Other        GM, nephews, many family members   Hyperlipidemia Other        ?   Prostate cancer Brother    Colon  cancer Neg Hx    He indicated that his mother is deceased. He indicated that his father is deceased. He indicated that his brother is deceased. He indicated that his maternal grandmother is deceased. He indicated that his paternal aunt is alive. He indicated that the status of his neg hx is unknown.  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   Tobacco Use   Smoking status: Former    Packs/day: 2.00    Years: 30.00    Additional pack years: 0.00    Total pack years: 60.00    Types: Cigarettes    Quit date: 06/17/1977    Years since quitting: 45.7   Smokeless tobacco: Never   Tobacco comments:    2 ppd, quit 1978  Vaping Use   Vaping Use: Never used  Substance and Sexual Activity   Alcohol use: Yes    Alcohol/week: 0.0 standard drinks of alcohol    Comment: socially    Drug use: No   Sexual activity: Yes  Other Topics Concern   Not on file  Social History Narrative   4 children (lost 1 son)   Wife has 2 children                Social Determinants of Health   Financial Resource Strain: Low Risk  (07/20/2021)   Overall Financial Resource Strain (CARDIA)    Difficulty of Paying Living Expenses: Not hard at all  Food Insecurity: No Food Insecurity (02/21/2023)   Hunger Vital Sign    Worried About Running Out of Food in the Last Year: Never true    Ran Out of Food in the Last Year: Never true  Transportation Needs: No Transportation Needs (02/21/2023)   PRAPARE - Administrator, Civil Service (Medical): No    Lack of Transportation (Non-Medical): No  Physical Activity: Inactive (07/20/2021)   Exercise Vital Sign    Days of Exercise per Week: 0 days    Minutes of Exercise per Session: 0 min  Stress: No Stress  Concern Present (07/20/2021)   [employer] of Occupational Health - Occupational Stress Questionnaire    Feeling of Stress : Not at all  Social Connections: Socially Integrated (07/20/2021)   Social Connection and Isolation Panel [NHANES]    Frequency of Communication with Friends and Family: More than three times a week    Frequency of Social Gatherings with Friends and Family: More than three times a week    Attends Religious Services: More than 4 times per year    Active Member of [employer] or Organizations: Yes    Attends Engineer, structural: More than 4 times per year    Marital Status: Married  Catering manager Violence: Not At Risk (02/21/2023)   Humiliation, Afraid, Rape, and Kick questionnaire    Fear of Current or Ex-Partner: No    Emotionally Abused: No    Physically Abused: No    Sexually Abused: No     Review of Systems    General:  No chills, fever, night sweats or weight changes.  Cardiovascular:  No chest pain, worsening dyspnea on exertion, no edema, orthopnea, palpitations, paroxysmal nocturnal dyspnea. Dermatological: No rash, lesions/masses Respiratory: No cough, positive for dyspnea Urologic: No hematuria, dysuria Abdominal:   No nausea, vomiting, diarrhea, bright red blood per rectum, melena, or hematemesis Neurologic:  No visual changes, wkns, changes in mental status. All other systems reviewed and are otherwise negative except as noted above.       Physical Exam    VS:  BP 124/72 (BP Location: Right Arm, Patient Position: Sitting, Cuff Size: Normal)   Pulse 69   Ht 6'  2" (1.88 m)   Wt 225 lb 6.4 oz (102.2 kg)   SpO2 92%   BMI 28.94 kg/m  , BMI Body mass index is 28.94 kg/m.     GEN: Well nourished, well developed, in no acute distress. HEENT: normal. Neck: Supple, no JVD, carotid bruits, or masses. Cardiac: RRR, no murmurs, rubs, or gallops. No clubbing, cyanosis, edema.  Radials/DP/PT 2+ and equal bilaterally.  Respiratory:   Respirations regular slightly unlabored, clear to auscultation bilaterally. GI: Soft, nontender, nondistended, BS + x 4. MS: no deformity or atrophy. Skin: warm and dry, no rash. Neuro:  Strength and sensation are intact. Psych: Normal affect.      Lab Results  Component Value Date   WBC 7.5 03/05/2023   HGB 16.7 03/05/2023   HCT 51.1 (H) 03/05/2023   MCV 99 (H) 03/05/2023   PLT 258 03/05/2023   Lab Results  Component Value Date   CREATININE 1.52 (H) 03/13/2023   BUN 21 03/13/2023   NA 141 03/13/2023   K 4.5 03/13/2023   CL 102 03/13/2023   CO2 25 03/13/2023   Lab Results  Component Value Date   ALT 30 02/21/2023   AST 37 02/21/2023   ALKPHOS 61 02/21/2023   BILITOT 0.7 02/21/2023   Lab Results  Component Value Date   CHOL 117 01/12/2022   HDL 36 (L) 01/12/2022   LDLCALC 65 01/12/2022   LDLDIRECT 55.0 01/01/2018   TRIG 81 01/12/2022   CHOLHDL 3.3 01/12/2022    Lab Results  Component Value Date   HGBA1C 6.2 (A) 02/05/2023     Review of Prior Studies    [facility] 03/13/2023   Ost Cx lesion is 60% stenosed.   Mid Cx to Dist Cx lesion is 40% stenosed.   Dist LAD lesion is 40% stenosed.   Non-stenotic Prox LAD lesion was previously treated.   Non-stenotic Prox RCA lesion was previously treated.   LV end diastolic pressure is mildly elevated.  Diagnostic Dominance: Right  Echocardiogram 03/11/2023  1. Global hypokinesis worse in the septum and inferior wall. Left  ventricular ejection fraction, by estimation, is 40 to 45%. The left  ventricle has mildly decreased function. The left ventricle demonstrates  global hypokinesis. The left ventricular  internal cavity size was moderately dilated. Left ventricular diastolic  parameters are consistent with Grade I diastolic dysfunction (impaired  relaxation).   2. Right ventricular systolic function is normal. The right ventricular  size is normal.   3. Left atrial size was moderately dilated.   4. The mitral valve is  abnormal. No evidence of mitral valve  regurgitation. No evidence of mitral stenosis.   5. Partial fusion of non and right cusps. The aortic valve is tricuspid.  There is mild calcification of the aortic valve. There is mild thickening  of the aortic valve. Aortic valve regurgitation is mild. Aortic valve  sclerosis is present, with no  evidence of aortic valve stenosis.   6. The inferior vena cava is normal in size with greater than 50%  respiratory variability, suggesting right atrial pressure of 3 mmHg.   Assessment & Plan   1.  Chronic mixed CHF: No evidence of volume overload on examination.  Will continue him on current medication regimen which will include Lasix, Jardiance, daily weights and salt avoidance.  Continue Entresto, metoprolol,  2.  Coronary artery disease: Cardiac catheterization as above stents are patent, with distal LAD stenosis of 40%.  He will continue on dual antiplatelet therapy, statin therapy, weight  reduction and Mediterranean diet.  Would recommend purposeful exercise when his breathing status improves  3.  Oxygen dependent COPD: Wears oxygen at night, and some during the day.  Does notice that only rarely.  Has not been followed by pulmonologist in several months.  He sees Jonathan. Jetty [name].  I have advised him to please make a follow-up appointment as his breathing status has changed.  Do not believe this is related to CHF as there is no evidence of volume overload on examination.  4.  Hypercholesterolemia: Remains on statin therapy.  Goal of LDL less than 70.  Follow-up lipids and LFTs can be drawn on next office visit if not completed by PCP.       Signed, Bettey Mare. Liborio Nixon, ANP, AACC   03/20/2023 4:26 PM      Office [phone] Fax [phone]  Notice: This dictation was prepared with Dragon dictation along with smaller phrase technology. Any transcriptional errors that result from this process are unintentional and may not be corrected upon  review.

## 2023-03-20 ENCOUNTER — Encounter: Payer: Self-pay | Admitting: Adult Health

## 2023-03-20 ENCOUNTER — Ambulatory Visit: Payer: Medicare Other | Attending: Adult Health | Admitting: Adult Health

## 2023-03-20 VITALS — BP 124/72 | HR 69 | Ht 74.0 in | Wt 225.4 lb

## 2023-03-20 DIAGNOSIS — E782 Mixed hyperlipidemia: Secondary | ICD-10-CM | POA: Diagnosis not present

## 2023-03-20 DIAGNOSIS — I739 Peripheral vascular disease, unspecified: Secondary | ICD-10-CM

## 2023-03-20 DIAGNOSIS — I2510 Atherosclerotic heart disease of native coronary artery without angina pectoris: Secondary | ICD-10-CM

## 2023-03-20 MED ORDER — PRALUENT 75 MG/ML ~~LOC~~ SOAJ
75.0000 mg | SUBCUTANEOUS | 3 refills | Status: DC
Start: 2023-03-20 — End: 2023-09-23

## 2023-03-20 MED ORDER — PRALUENT 75 MG/ML ~~LOC~~ SOAJ
75.0000 mg | SUBCUTANEOUS | 3 refills | Status: DC
Start: 2023-03-20 — End: 2023-03-20

## 2023-03-20 NOTE — Patient Instructions (Signed)
Medication Instructions:  No Changes *If you need a refill on your cardiac medications before your next appointment, please call your pharmacy*   Lab Work: No Labs If you have labs (blood work) drawn today and your tests are completely normal, you will receive your results only by: MyChart Message (if you have MyChart) OR A paper copy in the mail If you have any lab test that is abnormal or we need to change your treatment, we will call you to review the results.   Testing/Procedures: No Testing   Follow-Up: At [facility], you and your health needs are our priority.  As part of our continuing mission to provide you with exceptional heart care, we have created designated Provider Care Teams.  These Care Teams include your primary Cardiologist (physician) and Advanced Practice Providers (APPs -  Physician Assistants and Nurse Practitioners) who all work together to provide you with the care you need, when you need it.  We recommend signing up for the patient portal called "MyChart".  Sign up information is provided on this After Visit Summary.  MyChart is used to connect with patients for Virtual Visits (Telemedicine).  Patients are able to view lab/test results, encounter notes, upcoming appointments, etc.  Non-urgent messages can be sent to your provider as well.   To learn more about what you can do with MyChart, go to [email or web address].    Your next appointment:   1 year(s)  Provider:   Nanetta Batty, MD    Other Instructions Please Schedule Appointment with Pulmonology.

## 2023-03-22 ENCOUNTER — Ambulatory Visit: Payer: Medicare Other | Admitting: Adult Health

## 2023-03-29 ENCOUNTER — Ambulatory Visit: Payer: Self-pay

## 2023-03-29 NOTE — Patient Instructions (Addendum)
Visit Information  Thank you for taking time to visit with me today. Please don't hesitate to contact me if I can be of assistance to you.   Following are the goals we discussed today:  Obtain scales and begin to keep track of weights. Notify cardiologist if any signs/symptoms of HF exacerbation: weight gain 3 pounds in 24 hour period or 5 pounds in a week. Increased swelling of feet/ankles/stomach/hands; increased Shortness of breath, hard to breath laying down and you have to sit up to breath or increase in number of pillows needed, increased fatigue; you feel something is not right or any questions or concerns. Continue to take medications as prescribed Continue to attend provider visits as scheduled Contact your provider with health questions or concerns as needed Continue to check blood sugars as recommended by your provider and notify provider if outside recommended range. Continue to eat healthy: leans meats, fruits/vegetables, monitor sodium intake, avoid saturated fats and transfats Review educational material sent via email on COPD and Congestive Heart Failure   Our next appointment is by telephone on 05/08/23 at 9:45 am  Please call the care guide team at [phone] if you need to cancel or reschedule your appointment.   If you are experiencing a Mental Health or Behavioral Health Crisis or need someone to talk to, please call the Suicide and Crisis Lifeline: 101  Kathyrn Sheriff, RN, MSN, BSN, CCM Care Management Coordinator [phone]

## 2023-03-29 NOTE — Patient Outreach (Signed)
  Care Coordination   Initial Visit Note   03/29/2023 Name: Jonathan [name]. MRN: [id number] DOB: 1936/05/09  Jonathan Beath. is a 87 y.o. year old male who sees Wanda Plump, MD for primary care. I spoke with  Jonathan Beath. by phone today.  What matters to the patients health and wellness today? Reports "my heart". Admitted 02/21/23-02/22/23 a/c CHF and had cath on 03/12/26. Cardiology follow up visit completed on 03/20/23. Patient reports he does not have scales. Takes care of wife who has dementia-Has in home assistance([facility] and help of [facility])  6hr/day 5 days/week. Reports "my heart".   Goals Addressed             This Visit's Progress    Assist with Health Management       Interventions Today    Flowsheet Row Most Recent Value  Chronic Disease   Chronic disease during today's visit Hypertension (HTN), Diabetes, Chronic Obstructive Pulmonary Disease (COPD), Congestive Heart Failure (CHF)  General Interventions   General Interventions Discussed/Reviewed General Interventions Discussed, Doctor Visits, Durable Medical Equipment (DME)  Doctor Visits Discussed/Reviewed Doctor Visits Discussed  Durable Medical Equipment (DME) Oxygen, BP Cuff, Glucomoter, Dan Humphreys, Other  [encouraged patient to talk with [facility] regarding getting scales.]  Exercise Interventions   Exercise Discussed/Reviewed Physical Activity  [remain as active as tolerated]  Education Interventions   Education Provided Provided Education, Provided Web-based Education  [emmi Heart Failure: full program,  COPC: full Program]  Provided Verbal Education On Medication, When to see the doctor, Other  [discussed importance of weighing daily in management of HF, reviewed signs/symptoms of worsening. encouraged to follow up with providers as scheduled/recommended]  Mental Health Interventions   Mental Health Discussed/Reviewed Other  [assessed for caregiver stress-patient states he is doing ok-declines need for social work]   Nutrition Interventions   Nutrition Discussed/Reviewed Nutrition Discussed  Pharmacy Interventions   Pharmacy Dicussed/Reviewed Pharmacy Topics Discussed, Medication Adherence, Affording Medications, Medications and their functions            SDOH assessments and interventions completed:  Yes recently completed per patient no changes.   Care Coordination Interventions:  Yes, provided   Follow up plan: Follow up call scheduled for 05/08/23    Encounter Outcome:  Pt. Visit Completed   Kathyrn Sheriff, RN, MSN, BSN, CCM [facility] Care Coordinator [phone]

## 2023-04-01 ENCOUNTER — Encounter: Payer: Self-pay | Admitting: Internal Medicine

## 2023-04-01 ENCOUNTER — Ambulatory Visit (INDEPENDENT_AMBULATORY_CARE_PROVIDER_SITE_OTHER): Payer: Medicare Other | Admitting: Internal Medicine

## 2023-04-01 ENCOUNTER — Telehealth: Payer: Self-pay | Admitting: Adult Health

## 2023-04-01 VITALS — BP 134/80 | HR 73 | Temp 98.0°F | Resp 18 | Ht 74.0 in | Wt 223.0 lb

## 2023-04-01 DIAGNOSIS — I10 Essential (primary) hypertension: Secondary | ICD-10-CM | POA: Diagnosis not present

## 2023-04-01 DIAGNOSIS — Z7985 Long-term (current) use of injectable non-insulin antidiabetic drugs: Secondary | ICD-10-CM

## 2023-04-01 DIAGNOSIS — I2510 Atherosclerotic heart disease of native coronary artery without angina pectoris: Secondary | ICD-10-CM

## 2023-04-01 DIAGNOSIS — E1159 Type 2 diabetes mellitus with other circulatory complications: Secondary | ICD-10-CM

## 2023-04-01 DIAGNOSIS — Z7984 Long term (current) use of oral hypoglycemic drugs: Secondary | ICD-10-CM

## 2023-04-01 DIAGNOSIS — E782 Mixed hyperlipidemia: Secondary | ICD-10-CM | POA: Diagnosis not present

## 2023-04-01 LAB — LIPID PANEL
Cholesterol: 108 mg/dL (ref 0–200)
HDL: 43.4 mg/dL (ref >39.00)
LDL Cholesterol: 48 mg/dL (ref 0–99)
NonHDL: 64.15
Total CHOL/HDL Ratio: 2
Triglycerides: 83 mg/dL (ref 0.0–149.0)
VLDL: 16.6 mg/dL (ref 0.0–40.0)

## 2023-04-01 LAB — BASIC METABOLIC PANEL
BUN: 21 mg/dL (ref 6–23)
CO2: 26 mEq/L (ref 19–32)
Calcium: 9.7 mg/dL (ref 8.4–10.5)
Chloride: 107 mEq/L (ref 96–112)
Creatinine, Ser: 1.48 mg/dL (ref 0.40–1.50)
GFR: 43 mL/min — ABNORMAL LOW (ref >60.00)
Glucose, Bld: 104 mg/dL — ABNORMAL HIGH (ref 70–99)
Potassium: 4.3 mEq/L (ref 3.5–5.1)
Sodium: 140 mEq/L (ref 135–145)

## 2023-04-01 LAB — MICROALBUMIN / CREATININE URINE RATIO
Creatinine,U: 172.4 mg/dL
Microalb Creat Ratio: 18.3 mg/g (ref 0.0–30.0)
Microalb, Ur: 31.5 mg/dL — ABNORMAL HIGH (ref 0.0–1.9)

## 2023-04-01 MED ORDER — STIOLTO RESPIMAT 2.5-2.5 MCG/ACT IN AERS: 2.0000 | INHALATION_SPRAY | Freq: Every day | RESPIRATORY_TRACT | 2 refills | Status: DC

## 2023-04-01 NOTE — Progress Notes (Signed)
Subjective:    Patient ID: Jonathan Beath., male    DOB: July 16, 1936, 87 y.o.   MRN: [id number]  DOS:  04/01/2023 Type of visit - description: Follow-up   Chronic medical problems addressed.  Admitted to hospital 03/13/2023, had a elective cardiac catheterization, nonobstructive CAD. Subsequently saw cardiology, chart reviewed.  Uses albuterol once or twice daily, usually for mild SOB, no cough or quizzing.  SOB resolves quickly after albuterol.  No recent chest pain, difficulty breathing on lower extremity edema  Review of Systems See above   Past Medical History:  Diagnosis Date   CAD (coronary artery disease)    Two RCA stents remotely / 3rd RCA stent 2006   Colon polyps    s/p several Cscopes.   COPD (chronic obstructive pulmonary disease) (HCC)    on O2, nocturnal   Diabetes mellitus    dx aprox 2009   ED (erectile dysfunction)    has a vacumm device   Ejection fraction    Hyperlipidemia    dx in 90s   Hypertension    dx in the 79   Hypogonadism male    PVD (peripheral vascular disease) (HCC)    s/p stents at LE 2009, Dr Jacinto Halim   Shortness of breath    O2 Sat dropped to 82% walking on the treadmill, September, 2012    Past Surgical History:  Procedure Laterality Date   APPENDECTOMY     CORONARY STENT INTERVENTION N/A 01/12/2022   Procedure: CORONARY STENT INTERVENTION;  Surgeon: Swaziland, Peter M, MD;  Location: [facility] INVASIVE CV LAB;  Service: Cardiovascular;  Laterality: N/A;   LEFT HEART CATH AND CORONARY ANGIOGRAPHY N/A 03/13/2023   Procedure: LEFT HEART CATH AND CORONARY ANGIOGRAPHY;  Surgeon: [place]azil[place], Peter M, MD;  [facility] INVASIVE CV LAB;  Service: Cardiovascular;  Laterality: N/A;   RIGHT/LEFT HEART CATH AND CORONARY ANGIOGRAPHY N/A 01/11/2022   Procedure: RIGHT/LEFT HEART CATH AND CORONARY ANGIOGRAPHY;  Surgeon: Lyn Records, MD;  Location: [facility] INVASIVE CV LAB;  Service: Cardiovascular;  Laterality: N/A;   TONSILLECTOMY      Current Outpatient  Medications  Medication Instructions   albuterol (VENTOLIN HFA) 108 (90 Base) MCG/ACT inhaler USE 1 TO 2 INHALATIONS EVERY 6 HOURS AS NEEDED FOR WHEEZING OR SHORTNESS OF BREATH   aspirin 81 mg, Oral, Daily,     Blood Glucose Monitoring Suppl (FREESTYLE FREEDOM LITE) w/Device KIT Use Freestyle Freedom Lite meter to check blood sugar twice daily. DX:E11.65   clopidogrel (PLAVIX) 75 mg, Oral, Daily with breakfast   Dulaglutide (TRULICITY) 3 MG/0.5ML SOPN INJECT 3 MG UNDER THE SKIN ONCE A WEEK   empagliflozin (JARDIANCE) 25 mg, Oral, Daily before breakfast, For diabetes E11.65 and E 11.29   furosemide (LASIX) 40 mg, Oral, Daily   gabapentin (NEURONTIN) 100 MG capsule TAKE TWO CAPSULES BY MOUTH TWICE A DAY FOR NEUROPATHY   glucose blood (FREESTYLE LITE) test strip USE AS INSTRUCTED   hydrOXYzine (ATARAX) 12.5-25 mg, Oral, Every 8 hours PRN   Insulin Pen Needle (SURE COMFORT PEN NEEDLES) 31G X 6 MM MISC Use as directed   metFORMIN (GLUCOPHAGE) 1,000 mg, Oral, Daily with breakfast   metoprolol (TOPROL-XL) 100 mg, Oral, Daily   Multiple Vitamin (MULTIVITAMIN WITH MINERALS) TABS tablet 1 tablet, Oral, Daily   ondansetron (ZOFRAN) 4 mg, Oral, Every 8 hours PRN   OXYGEN 2 L/min, Inhalation, Daily at bedtime, PRN during day   potassium chloride SA (KLOR-CON M) 20 MEQ tablet 20 mEq, Oral, Daily  Praluent 75 mg, Subcutaneous, Every 14 days   sacubitril-valsartan (ENTRESTO) 24-26 MG 1 tablet, Oral, 2 times daily   simvastatin (ZOCOR) 20 mg, Oral, Daily   tamsulosin (FLOMAX) 0.4 mg, Oral, Daily after supper   Tiotropium Bromide-Olodaterol (STIOLTO RESPIMAT) 2.5-2.5 MCG/ACT AERS 2 puffs, Inhalation, Daily       Objective:   Physical Exam BP 134/80   Pulse 73   Temp 98 F (36.7 C) (Oral)   Resp 18   Ht 6\' 2"  (1.88 m)   Wt 223 lb (101.2 kg)   SpO2 93%   BMI 28.63 kg/m  General:   Well developed, NAD, BMI noted. HEENT:  Normocephalic . Face symmetric, atraumatic Lungs:  CTA B Normal  respiratory effort, no intercostal retractions, no accessory muscle use. Heart: RRR,  no murmur.  Lower extremities: no pretibial edema bilaterally  Skin: Not pale. Not jaundice Neurologic:  alert & oriented X3.  Speech normal, gait appropriate for age and unassisted Psych--  Cognition and judgment appear intact.  Cooperative with normal attention span and concentration.  Behavior appropriate. No anxious or depressed appearing.      Assessment    Assessment DM dx ~2009, complicated by CAD, PVD, mild DM neuropathy. Sees Dr. Lucianne Muss Hypogonadism. Sees Dr. Lucianne Muss HTN Hyperlipidemia COPD, nocturnal O2 prn Back pain, chronic: See visit 10/12/2016 CV: -CAD, Dr. Myrtis Ser Dr [place]ton See S/p  stenting 2 to the RCA remote past and the third RCA stent in  2006, negative stress test in 2012, LVEF 55% on echo 2015 -Peripheral vascular disease, stents lower extremity 2009  -02-2023: Chronic systolic and diastolic CHF newly recognized . Cath: Nonobstructive CAD ED Polycythemia: Saw hematology 11-2018, felt to be due to chronic respiratory failure.  Not likely polycythemia vera  PLAN    DM: To see Endo next month.  Ambulatory CBGs good, this morning was 105.  Denies symptoms of low blood sugar. HTN: BP today is very good, continue Lasix, metoprolol, potassium, Entresto, check a BMP  Dyslipidemia: On simvastatin, has not been able to get Praluent lately.  Recent LFTs normal.  Check FLP.  LDL goal less than 70.  Will share results with cardiology Cardiovascular: Since LOV had an elective cardiac catheterization,  patent stents, rec DAT, statins, lifestyle modifications. COPD, chronic hypoxemia: Currently uses albuterol once or twice a day for shortness of breath w/ good results, previously was recommended  Stiolto by pulmonary. Plan: Prescription printed for Stiolto, 2 puffs daily, albuterol only as needed. RTC 4 months

## 2023-04-01 NOTE — Patient Instructions (Addendum)
For your breathing: Stiolto 2 puffs once a day every day. Albuterol to be used only if you feel slightly short of breath.  Vaccines I recommend; Flu shot this fall RSV vaccine  Check the  blood pressure regularly BP GOAL is between 110/65 and  135/85. If it is consistently higher or lower, let me know   GO TO THE LAB : Get the blood work     GO TO THE FRONT DESK, PLEASE SCHEDULE YOUR APPOINTMENTS Come back for checkup in 3 to 4 months   Please bring or send [place] a copy of your Healthcare Power of Attorney for your chart.

## 2023-04-02 NOTE — Assessment & Plan Note (Addendum)
DM: To see Endo next month.  Ambulatory CBGs good, this morning was 105.  Denies symptoms of low blood sugar. HTN: BP today is very good, continue Lasix, metoprolol, potassium, Entresto, check a BMP  Dyslipidemia: On simvastatin, has not been able to get Praluent lately.  Recent LFTs normal.  Check FLP.  LDL goal less than 70.  Will share results with cardiology Cardiovascular: Since LOV had an elective cardiac catheterization, patent stents, rec DAT, statins, lifestyle modifications. COPD, chronic hypoxemia: Currently uses albuterol once or twice a day for shortness of breath w/ good results, previously was recommended  Stiolto by pulmonary. Plan: Prescription printed for Stiolto, 2 puffs daily, albuterol only as needed. RTC 4 months

## 2023-04-03 DIAGNOSIS — N5201 Erectile dysfunction due to arterial insufficiency: Secondary | ICD-10-CM | POA: Diagnosis not present

## 2023-04-10 ENCOUNTER — Other Ambulatory Visit (HOSPITAL_COMMUNITY): Payer: Self-pay

## 2023-04-10 ENCOUNTER — Telehealth: Payer: Self-pay | Admitting: Internal Medicine

## 2023-04-10 MED ORDER — FUROSEMIDE 40 MG PO TABS: 40.0000 mg | ORAL_TABLET | Freq: Every day | ORAL | 1 refills | Status: DC

## 2023-04-10 NOTE — Telephone Encounter (Signed)
Rx sent 

## 2023-04-10 NOTE — Addendum Note (Signed)
Addended byConrad Deckerville D on: 04/10/2023 04:41 PM   Modules accepted: Orders

## 2023-04-10 NOTE — Telephone Encounter (Signed)
Prescription Request  04/10/2023  Is this a "Controlled Substance" medicine? No  LOV: 04/01/2023  What is the name of the medication or equipment?   Rx #: [id number]  furosemide (LASIX) 40 MG tablet [[id number]]   Have you contacted your pharmacy to request a refill? No   Which pharmacy would you like this sent to?   [facility] - [place], [place] - [address] [place] [address] Phone: [phone] Fax: [phone]    Patient notified that their request is being sent to the clinical staff for review and that they should receive a response within 2 business days.   Please advise at Mobile [phone] (mobile)

## 2023-04-25 ENCOUNTER — Other Ambulatory Visit: Payer: Self-pay

## 2023-04-25 ENCOUNTER — Telehealth: Payer: Self-pay

## 2023-04-25 MED ORDER — STIOLTO RESPIMAT 2.5-2.5 MCG/ACT IN AERS: 2.0000 | INHALATION_SPRAY | Freq: Every day | RESPIRATORY_TRACT | 2 refills | Status: DC

## 2023-04-25 NOTE — Telephone Encounter (Signed)
[facility] Stiolto inhaler. Patient hasn't been seen in 2 years, but does have OV scheduled on 8/19. Dr. Maple Hudson please advise if okay to send in prescription

## 2023-04-25 NOTE — Telephone Encounter (Signed)
Yes thanks- ok to refill

## 2023-04-25 NOTE — Telephone Encounter (Signed)
Refill and Ov notes have been sent to the [place]. Closing encounter. NFN

## 2023-04-25 NOTE — Telephone Encounter (Signed)
Prescription and OV notes have been sent to [place]. Closing encounter. NFn

## 2023-04-26 ENCOUNTER — Other Ambulatory Visit (INDEPENDENT_AMBULATORY_CARE_PROVIDER_SITE_OTHER): Payer: Medicare Other

## 2023-04-26 DIAGNOSIS — E291 Testicular hypofunction: Secondary | ICD-10-CM

## 2023-04-26 DIAGNOSIS — E1165 Type 2 diabetes mellitus with hyperglycemia: Secondary | ICD-10-CM | POA: Diagnosis not present

## 2023-04-26 LAB — TSH: TSH: 1.88 u[IU]/mL (ref 0.35–5.50)

## 2023-04-26 LAB — TESTOSTERONE: Testosterone: 275.17 ng/dL — ABNORMAL LOW (ref 300.00–890.00)

## 2023-05-01 ENCOUNTER — Ambulatory Visit: Payer: Medicare Other | Admitting: Endocrinology

## 2023-05-03 ENCOUNTER — Ambulatory Visit (INDEPENDENT_AMBULATORY_CARE_PROVIDER_SITE_OTHER): Payer: Medicare Other | Admitting: Endocrinology

## 2023-05-03 ENCOUNTER — Encounter: Payer: Self-pay | Admitting: Endocrinology

## 2023-05-03 VITALS — BP 126/84 | HR 71 | Ht 74.0 in | Wt 218.6 lb

## 2023-05-03 DIAGNOSIS — E1165 Type 2 diabetes mellitus with hyperglycemia: Secondary | ICD-10-CM

## 2023-05-03 DIAGNOSIS — N289 Disorder of kidney and ureter, unspecified: Secondary | ICD-10-CM | POA: Diagnosis not present

## 2023-05-03 DIAGNOSIS — E291 Testicular hypofunction: Secondary | ICD-10-CM | POA: Diagnosis not present

## 2023-05-03 DIAGNOSIS — I10 Essential (primary) hypertension: Secondary | ICD-10-CM | POA: Diagnosis not present

## 2023-05-03 DIAGNOSIS — Z7985 Long-term (current) use of injectable non-insulin antidiabetic drugs: Secondary | ICD-10-CM | POA: Diagnosis not present

## 2023-05-03 LAB — POCT GLYCOSYLATED HEMOGLOBIN (HGB A1C): Hemoglobin A1C: 6 % — AB (ref 4.0–5.6)

## 2023-05-03 MED ORDER — TRULICITY 1.5 MG/0.5ML ~~LOC~~ SOAJ
SUBCUTANEOUS | 1 refills | Status: DC
Start: 2023-05-03 — End: 2023-08-01

## 2023-05-03 NOTE — Progress Notes (Signed)
=         Patient ID: Jonathan [name]., male   DOB: Dec 19, 1935, 87 y.o.   MRN: [id number]    Reason for Appointment:  Follow-up of various problems  History of Present Illness:          Diagnosis: Type 2 diabetes mellitus, date of diagnosis:  2009      Past history: He is not clear when his diabetes was diagnosed but according to hospital records it may have been in 2009. Most likely he was placed on metformin initially and at some point Amaryl added. In 2013 it was also given Januvia presumably to improve his control. However appears that his A1c has been consistently over 7% Janumet was started instead of metformin and Januvia separately in 2013  He was started on Trulicity in 6/15 instead of Victoza because excessive bruising on his abdomen  Later in 01/2015 he was switched to Bydureon because of insurance noncoverage of his Trulicity  Recent history:   Non-insulin hypoglycemic drugs are: Metformin 500 mg a.m. and 500,  9 PM, Trulicity 3.0  mg weekly, Jardiance 25 mg, daily  His A1c is 6 today, previously 6.8  Fructosamine 250, previously 319   Current blood sugar patterns and problems: His A1c is lower than expected from his blood sugar readings at home again He did not bring his monitor and not clear how often he is checking He does not report any readings higher than about 140+ His Jardiance was increased to 25 mg in the last visit He says that he is eating less overall and appears to have lost some weight  As before is not doing much formal exercise as he is involved with household activities  He is usually trying to plan healthy meals Has taken Trulicity every week regularly No side effects from Trulicity like nausea     Side effects from medications have been: None  Glucose monitoring:  done usually once a day       Glucometer:  FreeStyle  Blood Glucose readings from recall:   PRE-MEAL Fasting Lunch Dinner Bedtime Overall  Glucose range: 90s    140   Mean/median:        POST-MEAL PC Breakfast PC Lunch PC Dinner  Glucose range:   145  Mean/median:       prior  PRE-MEAL Fasting Lunch Dinner Bedtime Overall  Glucose range: 111-209      Mean/median: 148    163/60 days   POST-MEAL PC Breakfast PC Lunch PC Dinner  Glucose range:   140-229  Mean/median:   168      Meals: 3 meals per day. eating egg/meat bread for breakfast, dinner 6-7 PM     Dietician visit: Most recent: 4/16 .               Weight history:  Wt Readings from Last 3 Encounters:  05/03/23 218 lb 9.6 oz (99.2 kg)  04/01/23 223 lb (101.2 kg)  03/20/23 225 lb 6.4 oz (102.2 kg)   Glycemic control:   Lab Results  Component Value Date   HGBA1C 6.8 (A) 05/03/2023   HGBA1C 6.2 (A) 02/05/2023   HGBA1C 7.4 (H) 10/30/2022   Lab Results  Component Value Date   MICROALBUR 31.5 (H) 04/01/2023   LDLCALC 48 04/01/2023   CREATININE 1.48 04/01/2023    Lab Results  Component Value Date   FRUCTOSAMINE 250 04/26/2023   FRUCTOSAMINE 319 (H) 12/28/2021   FRUCTOSAMINE 288 (H) 05/24/2020   Lab  Results  Component Value Date   HGB 16.7 03/05/2023    OTHER active problems addressed today: See review of systems     Allergies as of 05/03/2023   No Known Allergies      Medication List        Accurate as of May 03, 2023 12:02 PM. If you have any questions, ask your nurse or doctor.          albuterol 108 (90 Base) MCG/ACT inhaler Commonly known as: VENTOLIN HFA USE 1 TO 2 INHALATIONS EVERY 6 HOURS AS NEEDED FOR WHEEZING OR SHORTNESS OF BREATH   aspirin 81 MG tablet Take 81 mg by mouth daily.   clopidogrel 75 MG tablet Commonly known as: PLAVIX Take 1 tablet (75 mg total) by mouth daily with breakfast.   empagliflozin 25 MG Tabs tablet Commonly known as: Jardiance Take 1 tablet (25 mg total) by mouth daily before breakfast. For diabetes E11.65 and E 11.29   Entresto 24-26 MG Generic drug: sacubitril-valsartan Take 1 tablet by mouth 2 (two)  times daily.   FreeStyle Freedom Lite w/Device Kit Use Freestyle Freedom Lite meter to check blood sugar twice daily. DX:E11.65   FREESTYLE LITE test strip Generic drug: glucose blood USE AS INSTRUCTED   furosemide 40 MG tablet Commonly known as: LASIX Take 1 tablet (40 mg total) by mouth daily.   gabapentin 100 MG capsule Commonly known as: NEURONTIN TAKE TWO CAPSULES BY MOUTH TWICE A DAY FOR NEUROPATHY   hydrOXYzine 25 MG tablet Commonly known as: ATARAX Take 0.5-1 tablets (12.5-25 mg total) by mouth every 8 (eight) hours as needed for anxiety.   metFORMIN 1000 MG tablet Commonly known as: GLUCOPHAGE Take 1 tablet (1,000 mg total) by mouth daily with breakfast.   metoprolol 200 MG 24 hr tablet Commonly known as: TOPROL-XL Take 100 mg by mouth daily.   multivitamin with minerals Tabs tablet Take 1 tablet by mouth daily.   ondansetron 4 MG tablet Commonly known as: ZOFRAN Take 1 tablet (4 mg total) by mouth every 8 (eight) hours as needed for nausea or vomiting.   OXYGEN Inhale 2 L/min into the lungs at bedtime. PRN during day   potassium chloride SA 20 MEQ tablet Commonly known as: KLOR-CON M Take 1 tablet (20 mEq total) by mouth daily.   Praluent 75 MG/ML Soaj Generic drug: Alirocumab Inject 1 mL (75 mg total) into the skin every 14 (fourteen) days.   simvastatin 20 MG tablet Commonly known as: ZOCOR Take 20 mg by mouth daily.   Stiolto Respimat 2.5-2.5 MCG/ACT Aers Generic drug: Tiotropium Bromide-Olodaterol Inhale 2 puffs into the lungs daily.   Sure Comfort Pen Needles 31G X 6 MM Misc Generic drug: Insulin Pen Needle Use as directed   tamsulosin 0.4 MG Caps capsule Commonly known as: FLOMAX Take 1 capsule (0.4 mg total) by mouth daily after supper.   Trulicity 3 MG/0.5ML Sopn Generic drug: Dulaglutide INJECT 3 MG UNDER THE SKIN ONCE A WEEK What changed:  how much to take how to take this when to take this additional instructions         Allergies: No Known Allergies  Past Medical History:  Diagnosis Date   CAD (coronary artery disease)    Two RCA stents remotely / 3rd RCA stent 2006   Colon polyps    s/p several Cscopes.   COPD (chronic obstructive pulmonary disease) (HCC)    on O2, nocturnal   Diabetes mellitus    dx aprox 2009  ED (erectile dysfunction)    has a vacumm device   Ejection fraction    Hyperlipidemia    dx in 90s   Hypertension    dx in the 47   Hypogonadism male    PVD (peripheral vascular disease) (HCC)    s/p stents at LE 2009, Dr Jacinto Halim   Shortness of breath    O2 Sat dropped to 82% walking on the treadmill, September, 2012    Past Surgical History:  Procedure Laterality Date   APPENDECTOMY     CORONARY STENT INTERVENTION N/A 01/12/2022   Procedure: CORONARY STENT INTERVENTION;  Surgeon: Swaziland, Peter M, MD;  Location: [facility] INVASIVE CV LAB;  Service: Cardiovascular;  Laterality: N/A;   LEFT HEART CATH AND CORONARY ANGIOGRAPHY N/A 03/13/2023   Procedure: LEFT HEART CATH AND CORONARY ANGIOGRAPHY;  Surgeon: Swaziland, Peter M, MD;  Location: [facility] INVASIVE CV LAB;  Service: Cardiovascular;  Laterality: N/A;   RIGHT/LEFT HEART CATH AND CORONARY ANGIOGRAPHY N/A 01/11/2022   Procedure: RIGHT/LEFT HEART CATH AND CORONARY ANGIOGRAPHY;  Surgeon: Lyn Records, MD;  Location: [facility] INVASIVE CV LAB;  Service: Cardiovascular;  Laterality: N/A;   TONSILLECTOMY      Family History  Problem Relation Age of Onset   Heart disease Father    Hypertension Father    Stroke Father    Diabetes Paternal Aunt    Diabetes Maternal Grandmother    Diabetes Other        GM, nephews, many family members   Hyperlipidemia Other        ?   Prostate cancer Brother    Colon cancer Neg Hx     Social History:  reports that he quit smoking about 45 years ago. His smoking use included cigarettes. He started smoking about 75 years ago. He has a 60 pack-year smoking history. He has never used smokeless tobacco. He reports  current alcohol use. He reports that he does not use drugs.    Review of Systems        Lipids: He has been on treatment with simvastatin 20 mg from his PCP below with good control as follows       Lab Results  Component Value Date   CHOL 108 04/01/2023   CHOL 117 01/12/2022   CHOL 127 06/27/2021   Lab Results  Component Value Date   HDL 43.40 04/01/2023   HDL 36 (L) 01/12/2022   HDL 42.30 06/27/2021   Lab Results  Component Value Date   LDLCALC 48 04/01/2023   LDLCALC 65 01/12/2022   LDLCALC 63 06/27/2021   Lab Results  Component Value Date   TRIG 83.0 04/01/2023   TRIG 81 01/12/2022   TRIG 107.0 06/27/2021   Lab Results  Component Value Date   CHOLHDL 2 04/01/2023   CHOLHDL 3.3 01/12/2022   CHOLHDL 3 06/27/2021   Lab Results  Component Value Date   LDLDIRECT 55.0 01/01/2018                HYPOGONADISM He had decreased libido and erectile dysfunction previously and was found to have hypogonadism, probably in 2011. He did have a slightly low free testosterone level in 2012 on treatment Prolactin level normal  Previously on AndroGel With his hemoglobin going up to 19.1 in August 2019 he has been told not to take any testosterone He was given clomiphene as a trial but he did not try this  Testosterone level is persistently low without testosterone therapy although only mildly decreased now He has  had some fatigue overall but not significant now   Lab Results  Component Value Date   TESTOSTERONE 275.17 (L) 04/26/2023   TESTOSTERONE 256.52 (L) 12/28/2021   TESTOSTERONE 245.96 (L) 10/24/2021    He has been followed by hematologist for erythrocytosis, labs as follows     Latest Ref Rng & Units 03/05/2023    9:55 AM 02/22/2023   12:59 AM 02/21/2023    3:22 AM  CBC  WBC 3.4 - 10.8 x10E3/uL 7.5  8.7  9.8   Hemoglobin 13.0 - 17.7 g/dL 18.8  41.6  60.6   Hematocrit 37.5 - 51.0 % 51.1  48.9  49.3   Platelets 150 - 450 x10E3/uL 258  207  256          HYPERTENSION: This is followed by PCP and is on  amlodipine 5 mg and metoprolol   He is checking at home also  BP Readings from Last 3 Encounters:  05/03/23 126/84  04/01/23 134/80  03/20/23 124/72   RENAL dysfunction: This is mild and his creatinine is stable   Lab Results  Component Value Date   CREATININE 1.48 04/01/2023   CREATININE 1.52 (H) 03/13/2023   CREATININE 1.48 (H) 03/05/2023   NEUROPATHY: He has had persistent sensory loss, also has tingling in his distal feet   Physical Examination:  BP 126/84   Pulse 71   Ht 6\' 2"  (1.88 m)   Wt 218 lb 9.6 oz (99.2 kg)   SpO2 96%   BMI 28.07 kg/m   Diabetic Foot Exam - Simple   No data filed        ASSESSMENT/PLAN:   Diabetes type 2, with mild obesity, non-insulin-dependent  His A1c is 6% Also fructosamine is excellent at 250  Currently on 3 mg Trulicity, 25 mg Jardiance and Metformin 1500 mg daily  He has lost a little weight from decreased intake overall This is likely improving his sugar Unable to verify his home blood sugars since he did not bring his meter but lab glucose last month was 104 Because of his decreased intake we will reduce his Trulicity to 1.5 mg for 1 month as a trial and if this is working out better he can continue this long-term  He does have complications of neuropathy with symptoms of numbness and findings as seen on his exam today   HYPERTENSION: Blood pressure is normal   RENAL dysfunction: Mild and is stable  Hypogonadism: This is mild and not clear if his fatigue is related to his lifestyle and stress level  Because of his history of erythrocytosis will not start any supplement   Follow-up in 3 months  There are no Patient Instructions on file for this visit.    Reather Littler 05/03/2023, 12:03 PM   Note: This office note was prepared with Dragon voice recognition system technology. Any transcriptional errors that result from this process are unintentional.

## 2023-05-04 NOTE — Progress Notes (Signed)
HPI male former smoker followed for COPD, chronic respiratory failure with hypoxia, complicated by CAD/MI/ Stents/ PAD, DM2, polycythemia PFT- PFT 08/13/18- Diffusion moderately reduced. Normal spirometry flows and lung volumes10/23/12-  Normal spirometry flows with small- airway response to bronchodilator, normal lung volumes and moderately reduced diffusion. FEV1/FVC 0.79, DLCO 58% -06/18/12- 96%, 88%, 94% 449 M. Significant desaturation with exercise. Overnight Oximetry 02/07/17-room air-sustained desaturation less than or equal to 88% for over 7 hours Walk Test 2019-  Walk Test POC qualifying 05/06/23- Did not qualify for portable O2. Lowest saturation was 90% on room air. ------------------------------------------------------------------------------------------------------------   08/03/21- 87 year old male former smoker (60 pk yr) followed for COPD, chronic respiratory failure with hypoxia,, complicated by CAD/MI/ stents/ PAD, DM2, Polycythemia O2 2 L/ APS-sleep and exertion Body weight today 229 lbs Covid vax-5 Moderna Flu vax-had -Albuterol HFA, Stiolto 2.5, amoxicillin to hold, Zyrtec -----Patient states that he feels good overall but has been losing weight and is not sure why and states he has not been trying to lose weight.  Breathing okay without acute event.  He has not been using his inhalers at all.  05/06/23- 87 year old male former smoker (60 pk yr) followed for COPD, chronic respiratory failure with Hypoxia,, complicated by CAD/MI/ stents/ PAD,CHF, H DM2, PVD,  O2 2 L/ APS-sleep  -Albuterol hfa, Stiolto 2.5,  Body weight today  Arrival O2 sat 97% on room air. Oxygen used mainly at night. He asks POC- lighter to carry. Walk Test POC qualifying 05/06/23- Did not qualify for portable O2. Lowest saturation was 90% on room air. Asks refill Stiolto CXR 02/21/23-  IMPRESSION: Changes most consistent with interstitial edema.  ROS-see HPI  + = positive Constitutional:   No-    weight loss, night sweats, fevers, chills, fatigue, lassitude. HEENT:   No-  headaches, difficulty swallowing, tooth/dental problems, sore throat,       No-  sneezing, itching, ear ache, nasal congestion, post nasal drip,  CV:  No-   chest pain, orthopnea, PND, swelling in lower extremities, anasarca, dizziness, palpitations,                 Claudication R thigh Resp:   + shortness of breath with exertion or at rest.              No-   productive cough,  No non-productive cough,  No- coughing up of blood.              No-   change in color of mucus.   wheezing.   Skin: No-   rash or lesions. GI:  No-   heartburn, indigestion, abdominal pain, nausea, vomiting, GU:  MS:  No-   joint pain or swelling.   Neuro-     nothing unusual Psych:  No- change in mood or affect. No depression or anxiety.  No memory loss.  OBJ- Physical Exam     General- Alert, Oriented, Affect-appropriate, Distress- none acute., looks fit Skin- rash-none, lesions- none, excoriation- none Lymphadenopathy-  Head- atraumatic            Eyes- Gross vision intact, PERRLA, conjunctivae and secretions clear            Ears- Hearing, canals-normal            Nose- +stuffy, no-Septal dev, mucus, polyps, erosion, perforation             Throat- Mallampati II , mucosa clear , drainage- none, tonsils- atrophic Neck- flexible , trachea midline, no stridor ,  thyroid nl, carotid no bruit Chest - symmetrical excursion , unlabored           Heart/CV- RRR/rapid , no murmur , no gallop  , no rub, nl s1 s2                           - JVD none , edema- none, stasis changes- none, varices- none           Lung- +diminished, cough+light , dullness-none, rub- none,            Chest wall-  Abd-  Br/ Gen/ Rectal- Not done, not indicated Extrem- cyanosis- none,  atrophy- none, strength- nl  Neuro- grossly intact to observation

## 2023-05-06 ENCOUNTER — Encounter: Payer: Self-pay | Admitting: Internal Medicine

## 2023-05-06 ENCOUNTER — Ambulatory Visit (INDEPENDENT_AMBULATORY_CARE_PROVIDER_SITE_OTHER): Payer: Medicare Other | Admitting: Internal Medicine

## 2023-05-06 VITALS — BP 122/82 | HR 95 | Temp 97.5°F | Ht 74.0 in | Wt 217.0 lb

## 2023-05-06 DIAGNOSIS — J449 Chronic obstructive pulmonary disease, unspecified: Secondary | ICD-10-CM | POA: Diagnosis not present

## 2023-05-06 DIAGNOSIS — J9611 Chronic respiratory failure with hypoxia: Secondary | ICD-10-CM | POA: Diagnosis not present

## 2023-05-06 MED ORDER — STIOLTO RESPIMAT 2.5-2.5 MCG/ACT IN AERS: 2.0000 | INHALATION_SPRAY | Freq: Every day | RESPIRATORY_TRACT | 4 refills | Status: DC

## 2023-05-06 NOTE — Patient Instructions (Signed)
Order- POC qualifying walk test   dx dyspnea on exertion  Stiolto refilled at [facility]

## 2023-05-08 ENCOUNTER — Ambulatory Visit: Payer: Self-pay

## 2023-05-08 NOTE — Patient Outreach (Signed)
  Care Coordination   Follow Up Visit Note   05/08/2023 Name: Jonathan [name]. MRN: [id number] DOB: [birth year]  Jonathan [name]. is a 87 y.o. year old male who sees Wanda Plump, MD for primary care. I spoke with  Jonathan [name]. by phone today.  What matters to the patients health and wellness today?  Mr. Kopas reports improvement over the past month. Pulmonologist visit completed 05/06/23 and endocrinologist 05/03/23.  Denies any questions or concerns at this time. Reports has not reviewed educational material previously sent.  Goals Addressed             This Visit's Progress    Assist with Health Management       Interventions Today    Flowsheet Row Most Recent Value  Chronic Disease   Chronic disease during today's visit Chronic Obstructive Pulmonary Disease (COPD), Diabetes, Congestive Heart Failure (CHF), Hypertension (HTN)  General Interventions   General Interventions Discussed/Reviewed General Interventions Reviewed, Doctor Visits  Doctor Visits Discussed/Reviewed Doctor Visits Reviewed  Education Interventions   Education Provided Provided Web-based Education, Provided Education  [reassigned COPD: full program and HF: full program videos]  Provided Verbal Education On Other, Medication, Blood Sugar Monitoring  [advised to take medications as prescribed, advised to attend provider visits as scheduled, medications reviewed, advised to contact provider with questions concerns. advised to get a working scale to monitor weights.]  Nutrition Interventions   Nutrition Discussed/Reviewed Nutrition Reviewed  Pharmacy Interventions   Pharmacy Dicussed/Reviewed Pharmacy Topics Reviewed            SDOH assessments and interventions completed:  No  Care Coordination Interventions:  Yes, provided   Follow up plan: Follow up call scheduled for 06/06/23    Encounter Outcome:  Pt. Visit Completed   Kathyrn Sheriff, RN, MSN, BSN, [facility] Care Coordinator [phone]

## 2023-05-08 NOTE — Patient Instructions (Signed)
Visit Information  Thank you for taking time to visit with me today. Please don't hesitate to contact me if I can be of assistance to you.   Following are the goals we discussed today:  Continue to take medications as prescribed. Continue to attend provider visits as scheduled Continue to eat healthy, lean meats, vegetables, fruits, avoid saturated and transfats Begin to keep track of your weights   Our next appointment is by telephone on 06/06/23 at 9:30 am  Please call the care guide team at [phone] if you need to cancel or reschedule your appointment.   If you are experiencing a Mental Health or Behavioral Health Crisis or need someone to talk to, please call the Suicide and Crisis Lifeline: 988 call the [place] National Suicide Prevention Lifeline: (681) 192-6651 or TTY: 760 372 2490 TTY 702-098-5574) to talk to a trained counselor call 1-800-273-TALK (toll free, 24 hour hotline)  Kathyrn Sheriff, RN, MSN, BSN, CCM Care Management Coordinator [phone]

## 2023-05-10 ENCOUNTER — Encounter: Payer: Self-pay | Admitting: Internal Medicine

## 2023-05-10 DIAGNOSIS — J449 Chronic obstructive pulmonary disease, unspecified: Secondary | ICD-10-CM | POA: Insufficient documentation

## 2023-05-10 NOTE — Assessment & Plan Note (Signed)
Likely a cardiac component to exertional dyspnea. He didn't qualify for POC on walk test tody- didn't desaturate below 90%. Plan- continue O2 for sleep 2L

## 2023-05-10 NOTE — Assessment & Plan Note (Signed)
Continuing Stiolto

## 2023-05-24 DIAGNOSIS — Z23 Encounter for immunization: Secondary | ICD-10-CM | POA: Diagnosis not present

## 2023-06-05 ENCOUNTER — Telehealth: Payer: Self-pay | Admitting: Internal Medicine

## 2023-06-05 NOTE — Telephone Encounter (Signed)
Spoke w/ Pt- informed of recommendations. Pt denies fever, chills, or other sx's. Appt scheduled for when PCP returns to office.

## 2023-06-05 NOTE — Telephone Encounter (Signed)
Pt called stating that he was losing 1-2 lbs a day now and he is currently down to 211 lbs. Pt stated that Dr. Drue Novel had instructed him to call if this occurred. Please advise.

## 2023-06-05 NOTE — Telephone Encounter (Signed)
Please advise 

## 2023-06-05 NOTE — Telephone Encounter (Signed)
  Recent results reviewed, normal renal function, no anemia, last A1c 6.0.  Testosterone was low when checked by Endo. TSH WNL. Advised patient. If he has fever, chills, swelling or other significant symptoms: Arrange an appointment within the next few days with somebody else at the office. Otherwise arrange an appointment in 3 weeks (I will not be available in the near future)  Wt Readings from Last 3 Encounters:  05/06/23 217 lb (98.4 kg)  05/03/23 218 lb 9.6 oz (99.2 kg)  04/01/23 223 lb (101.2 kg)

## 2023-06-06 ENCOUNTER — Ambulatory Visit: Payer: Self-pay

## 2023-06-06 NOTE — Patient Outreach (Signed)
Care Coordination   Follow Up Visit Note   06/06/2023 Name: Jonathan [name]. MRN: [id number] DOB: [birth year]  Jonathan [name]. is a 87 y.o. year old male who sees Wanda Plump, MD for primary care. I spoke with  Jonathan [name]. by phone today.  What matters to the patients health and wellness today?  Mr. Drudge states, "I am doing fine". Hr denies any questions or concerns a this time. He states he has seen his pulmonologist and endocrinologist. Request RNCM call back at another date/time to discuss educational material previously sent.  Goals Addressed             This Visit's Progress    Assist with Health Management       Interventions Today    Flowsheet Row Most Recent Value  Chronic Disease   Chronic disease during today's visit Chronic Obstructive Pulmonary Disease (COPD), Hypertension (HTN), Congestive Heart Failure (CHF)  General Interventions   General Interventions Discussed/Reviewed General Interventions Reviewed  [assessed patient status, referral to care guide to schedule for another date/time]            SDOH assessments and interventions completed:  No  Care Coordination Interventions:  Yes, provided   Follow up plan: Referral made to care guide to reschedule appointment    Encounter Outcome:  Patient Visit Completed   Kathyrn Sheriff, RN, MSN, BSN, CCM Care Management Coordinator [phone]

## 2023-06-06 NOTE — Patient Instructions (Signed)
Visit Information  Thank you for taking time to visit with me today. Please don't hesitate to contact me if I can be of assistance to you.   Following are the goals we discussed today:  Continue to take medications as prescribed. Continue to attend provider visits as scheduled Continue to eat healthy, lean meats, vegetables, fruits, avoid saturated and transfats Contact provider if any health questions or concerns  The Care Guide will call you to schedule your next telephone appointment.  Please call the care guide team at [phone] if you need to cancel or reschedule your appointment.   If you are experiencing a Mental Health or Behavioral Health Crisis or need someone to talk to, please call the Suicide and Crisis Lifeline: 988 call the [place] National Suicide Prevention Lifeline: (236) 701-8313 or TTY: 365-673-6157 TTY 308-091-6382) to talk to a trained counselor call 1-800-273-TALK (toll free, 24 hour hotline)  Kathyrn Sheriff, RN, MSN, BSN, CCM Care Management Coordinator  [phone]

## 2023-07-10 ENCOUNTER — Ambulatory Visit: Payer: Medicare Other | Admitting: Internal Medicine

## 2023-07-10 ENCOUNTER — Telehealth: Payer: Self-pay

## 2023-07-10 ENCOUNTER — Encounter: Payer: Self-pay | Admitting: Internal Medicine

## 2023-07-10 NOTE — Telephone Encounter (Signed)
Multiple no shows-   08/16/2021 01/03/2022 07/23/2022 07/10/2023  Warning letters were sent on 08/16/2021 and 01/03/22.  Would you like to begin dismissal process?

## 2023-07-10 NOTE — Telephone Encounter (Signed)
Letter mailed

## 2023-07-10 NOTE — Telephone Encounter (Signed)
Send him a letter, ask him to cancel appointment if he is not able to make it. Make him notice has missed several appointments.

## 2023-07-17 NOTE — Telephone Encounter (Signed)
Patient received letter and said he was not aware of appt on 07/10/23. Advised appt was made with CMA on the phone 06/05/23. Patient said he may have missed it since he is keeping up with his appts as well as his wife's appts. Pt confirmed date/time of next appt for himself and his wife.

## 2023-08-01 ENCOUNTER — Ambulatory Visit: Payer: Medicare Other | Admitting: Endocrinology

## 2023-08-01 ENCOUNTER — Encounter: Payer: Self-pay | Admitting: Endocrinology

## 2023-08-01 VITALS — BP 138/70 | HR 89 | Resp 20 | Ht 74.0 in | Wt 217.6 lb

## 2023-08-01 DIAGNOSIS — Z7985 Long-term (current) use of injectable non-insulin antidiabetic drugs: Secondary | ICD-10-CM

## 2023-08-01 DIAGNOSIS — Z7984 Long term (current) use of oral hypoglycemic drugs: Secondary | ICD-10-CM | POA: Diagnosis not present

## 2023-08-01 DIAGNOSIS — E118 Type 2 diabetes mellitus with unspecified complications: Secondary | ICD-10-CM

## 2023-08-01 LAB — POCT GLYCOSYLATED HEMOGLOBIN (HGB A1C): Hemoglobin A1C: 6.4 % — AB (ref 4.0–5.6)

## 2023-08-01 MED ORDER — EMPAGLIFLOZIN 25 MG PO TABS: 25.0000 mg | ORAL_TABLET | Freq: Every day | ORAL | 3 refills | Status: DC

## 2023-08-01 MED ORDER — METFORMIN HCL 500 MG PO TABS: 500.0000 mg | ORAL_TABLET | Freq: Two times a day (BID) | ORAL | 3 refills | Status: DC

## 2023-08-01 MED ORDER — TRULICITY 3 MG/0.5ML ~~LOC~~ SOAJ: SUBCUTANEOUS | 3 refills | Status: DC

## 2023-08-01 NOTE — Progress Notes (Signed)
Outpatient Endocrinology Note Jonathan Menucha Dicesare, MD  08/01/23  Patient's Name: Jonathan [name].    DOB: 24-Jun-1936    MRN: [id number]                                                    REASON OF VISIT: Follow up of type 2 diabetes mellitus  PCP: Wanda Plump, MD  HISTORY OF PRESENT ILLNESS:   Jonathan Puff. is a 87 y.o. old male with past medical history listed below, is here for new consult / follow up for type 2 diabetes mellitus.   Pertinent Diabetes History: Patient was diagnosed with type 2 diabetes mellitus in 2009.  Patient has controlled type 2 diabetes mellitus.  Chronic Diabetes Complications : Retinopathy: no. Last ophthalmology exam was done on annually, reportedly, following with ophthalmology regularly.  Nephropathy: CKD III, on ACE/ARB /valsartan Peripheral neuropathy: yes, on gabapentin. Has PAD. Coronary artery disease: yes. Stroke: no  Relevant comorbidities and cardiovascular risk factors: Obesity: no Body mass index is 27.94 kg/m.  Hypertension: Yes  Hyperlipidemia : Yes, on statin   Current / Home Diabetic regimen includes:  Metformin 500 mg 2 times a day. Trulicity 3.0 mg weekly. Jardiance 25 mg daily.  Prior diabetic medications:  Glycemic data:   He has Psychiatrist, download in last 60 days from September 15 to November 13 reviewed average blood sugar 126.  He has been taking about 0.3 times per day in average.  Highest blood sugar 175, lowest 80.  Some of the blood sugar 111, 93, 132, 127, 117, 97, 160, 87, 141, 80, 174.  He has been checking at different times of the day.  Hypoglycemia: Patient has no hypoglycemic episodes. Patient has hypoglycemia awareness.  Factors modifying glucose control: 1.  Diabetic diet assessment: 3 meals a day.  2.  Staying active or exercising: No formal exercise per physically active at home take care of his demented wife.  3.  Medication compliance: compliant all of the time.  # HYPOGONADISM He had  decreased libido and erectile dysfunction previously and was found to have hypogonadism, probably in 2011.He did have a slightly low free testosterone level in 2012 on treatment. Prolactin level normal.  Previously on AndroGel. With his hemoglobin going up to 19.1 in August 2019 he has been told not to take any testosterone. He was given clomiphene as a trial but he did not try this. Testosterone level is persistently low without testosterone therapy although only mildly decreased now. He has had some fatigue overall but not significant now. Because of his history of erythrocytosis will not start any supplement. He has been followed by hematologist for erythrocytosis.  Interval history  Diabetes regimen as reviewed above.  Glucometer data as reviewed above.  Denies any GI issues related with Trulicity, tolerating well.  No other complaints today.  REVIEW OF SYSTEMS As per history of present illness.   PAST MEDICAL HISTORY: Past Medical History:  Diagnosis Date   CAD (coronary artery disease)    Two RCA stents remotely / 3rd RCA stent 2006   Colon polyps    s/p several Cscopes.   COPD (chronic obstructive pulmonary disease) (HCC)    on O2, nocturnal   Diabetes mellitus    dx aprox 2009   ED (erectile dysfunction)    has a vacumm device  Ejection fraction    Hyperlipidemia    dx in 90s   Hypertension    dx in the 46   Hypogonadism male    PVD (peripheral vascular disease) (HCC)    s/p stents at LE 2009, Dr Jacinto Halim   Shortness of breath    O2 Sat dropped to 82% walking on the treadmill, September, 2012    PAST SURGICAL HISTORY: Past Surgical History:  Procedure Laterality Date   APPENDECTOMY     CORONARY STENT INTERVENTION N/A 01/12/2022   Procedure: CORONARY STENT INTERVENTION;  Surgeon: Swaziland, Peter M, MD;  Location: [facility] INVASIVE CV LAB;  Service: Cardiovascular;  Laterality: N/A;   LEFT HEART CATH AND CORONARY ANGIOGRAPHY N/A 03/13/2023   Procedure: LEFT HEART CATH AND CORONARY  ANGIOGRAPHY;  Surgeon: Swaziland, Peter M, MD;  Location: [facility] INVASIVE CV LAB;  Service: Cardiovascular;  Laterality: N/A;   RIGHT/LEFT HEART CATH AND CORONARY ANGIOGRAPHY N/A 01/11/2022   Procedure: RIGHT/LEFT HEART CATH AND CORONARY ANGIOGRAPHY;  Surgeon: Lyn Records, MD;  Location: [facility] INVASIVE CV LAB;  Service: Cardiovascular;  Laterality: N/A;   TONSILLECTOMY      ALLERGIES: No Known Allergies  FAMILY HISTORY:  Family History  Problem Relation Age of Onset   Heart disease Father    Hypertension Father    Stroke Father    Diabetes Paternal Aunt    Diabetes Maternal Grandmother    Diabetes Other        GM, nephews, many family members   Hyperlipidemia Other        ?   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   Tobacco Use   Smoking status: Former    Current packs/day: 0.00    Average packs/day: 2.0 packs/day for 30.0 years (60.0 ttl pk-yrs)    Types: Cigarettes    Start date: 06/18/1947    Quit date: 06/17/1977    Years since quitting: 46.1   Smokeless tobacco: Never   Tobacco comments:    2 ppd, quit 1978  Vaping Use   Vaping status: Never Used  Substance and Sexual Activity   Alcohol use: Yes    Alcohol/week: 0.0 standard drinks of alcohol    Comment: socially    Drug use: No   Sexual activity: Yes  Other Topics Concern   Not on file  Social History Narrative   4 children (lost 1 son)   Wife has 2 children                Social Determinants of Health   Financial Resource Strain: Low Risk  (07/20/2021)   Overall Financial Resource Strain (CARDIA)    Difficulty of Paying Living Expenses: Not hard at all  Food Insecurity: No Food Insecurity (02/21/2023)    Hunger Vital Sign    Worried About Running Out of Food in the Last Year: Never true    Ran Out of Food in the Last Year: Never true  Transportation Needs: No Transportation Needs (02/21/2023)   PRAPARE - Administrator, Civil Service (Medical): No    Lack of Transportation (Non-Medical): No  Physical Activity: Inactive (07/20/2021)  Exercise Vital Sign    Days of Exercise per Week: 0 days    Minutes of Exercise per Session: 0 min  Stress: No Stress Concern Present (07/20/2021)   [employer] of Occupational Health - Occupational Stress Questionnaire    Feeling of Stress : Not at all  Social Connections: Socially Integrated (07/20/2021)   Social Connection and Isolation Panel [NHANES]    Frequency of Communication with Friends and Family: More than three times a week    Frequency of Social Gatherings with Friends and Family: More than three times a week    Attends Religious Services: More than 4 times per year    Active Member of [employer] or Organizations: Yes    Attends Engineer, structural: More than 4 times per year    Marital Status: Married    MEDICATIONS:  Current Outpatient Medications  Medication Sig Dispense Refill   albuterol (VENTOLIN HFA) 108 (90 Base) MCG/ACT inhaler USE 1 TO 2 INHALATIONS EVERY 6 HOURS AS NEEDED FOR WHEEZING OR SHORTNESS OF BREATH 51 g 3   Alirocumab (PRALUENT) 75 MG/ML SOAJ Inject 1 mL (75 mg total) into the skin every 14 (fourteen) days. 6 mL 3   aspirin 81 MG tablet Take 81 mg by mouth daily.     Blood Glucose Monitoring Suppl (FREESTYLE FREEDOM LITE) w/Device KIT Use Freestyle Freedom Lite meter to check blood sugar twice daily. DX:E11.65 1 kit 0   clopidogrel (PLAVIX) 75 MG tablet Take 1 tablet (75 mg total) by mouth daily with breakfast. 90 tablet 1   furosemide (LASIX) 40 MG tablet Take 1 tablet (40 mg total) by mouth daily. 90 tablet 1   gabapentin (NEURONTIN) 100 MG capsule TAKE TWO CAPSULES BY MOUTH TWICE A DAY FOR NEUROPATHY      glucose blood (FREESTYLE LITE) test strip USE AS INSTRUCTED 100 strip 11   hydrOXYzine (ATARAX) 25 MG tablet Take 0.5-1 tablets (12.5-25 mg total) by mouth every 8 (eight) hours as needed for anxiety. 30 tablet 1   Insulin Pen Needle (SURE COMFORT PEN NEEDLES) 31G X 6 MM MISC Use as directed 100 each 3   metoprolol (TOPROL-XL) 200 MG 24 hr tablet Take 100 mg by mouth daily.     Multiple Vitamin (MULTIVITAMIN WITH MINERALS) TABS tablet Take 1 tablet by mouth daily.     ondansetron (ZOFRAN) 4 MG tablet Take 1 tablet (4 mg total) by mouth every 8 (eight) hours as needed for nausea or vomiting. 20 tablet 0   OXYGEN Inhale 2 L/min into the lungs at bedtime. PRN during day     potassium chloride SA (KLOR-CON M) 20 MEQ tablet Take 1 tablet (20 mEq total) by mouth daily. 30 tablet 0   sacubitril-valsartan (ENTRESTO) 24-26 MG Take 1 tablet by mouth 2 (two) times daily. 60 tablet 0   simvastatin (ZOCOR) 20 MG tablet Take 20 mg by mouth daily.     tamsulosin (FLOMAX) 0.4 MG CAPS capsule Take 1 capsule (0.4 mg total) by mouth daily after supper. 90 capsule 1   Tiotropium Bromide-Olodaterol (STIOLTO RESPIMAT) 2.5-2.5 MCG/ACT AERS Inhale 2 puffs into the lungs daily. 12 g 4   Dulaglutide (TRULICITY) 3 MG/0.5ML SOAJ INJECT 3 MG UNDER THE SKIN ONCE A WEEK 6 mL 3   empagliflozin (JARDIANCE) 25 MG TABS tablet Take 1 tablet (25 mg total) by mouth daily before breakfast. For diabetes E11.65 and E 11.29 90 tablet 3   metFORMIN (GLUCOPHAGE) 500 MG tablet Take 1 tablet (500 mg total) by  mouth 2 (two) times daily with a meal. 180 tablet 3   No current facility-administered medications for this visit.    PHYSICAL EXAM: Vitals:   08/01/23 1103  BP: 138/70  Pulse: 89  Resp: 20  SpO2: 93%  Weight: 217 lb 9.6 oz (98.7 kg)  Height: 6\' 2"  (1.88 m)   Body mass index is 27.94 kg/m.  Wt Readings from Last 3 Encounters:  08/01/23 217 lb 9.6 oz (98.7 kg)  05/06/23 217 lb (98.4 kg)  05/03/23 218 lb 9.6 oz (99.2 kg)     General: Well developed, well nourished male in no apparent distress.  HEENT: AT/[place], no external lesions.  Eyes: Conjunctiva clear and no icterus. Neck: Neck supple  Lungs: Respirations not labored Neurologic: Alert, oriented, normal speech Extremities / Skin: Dry.   Psychiatric: Does not appear depressed or anxious  Diabetic Foot Exam - Simple   No data filed    LABS Reviewed Lab Results  Component Value Date   HGBA1C 6.4 (A) 08/01/2023   HGBA1C 6.0 (A) 05/03/2023   HGBA1C 6.2 (A) 02/05/2023   Lab Results  Component Value Date   FRUCTOSAMINE 250 04/26/2023   FRUCTOSAMINE 319 (H) 12/28/2021   FRUCTOSAMINE 288 (H) 05/24/2020   Lab Results  Component Value Date   CHOL 108 04/01/2023   HDL 43.40 04/01/2023   LDLCALC 48 04/01/2023   LDLDIRECT 55.0 01/01/2018   TRIG 83.0 04/01/2023   CHOLHDL 2 04/01/2023   Lab Results  Component Value Date   MICRALBCREAT 18.3 04/01/2023   MICRALBCREAT 8.1 04/09/2022   Lab Results  Component Value Date   CREATININE 1.48 04/01/2023   Lab Results  Component Value Date   GFR 43.00 (L) 04/01/2023    ASSESSMENT / PLAN  1. Controlled type 2 diabetes mellitus with complication, without long-term current use of insulin (HCC)     Diabetes Mellitus type 2, complicated by peripheral neuropathy/CAD/CKD. - Diabetic status / severity: Controlled.  Lab Results  Component Value Date   HGBA1C 6.4 (A) 08/01/2023    - Hemoglobin A1c goal : <7%  - Medications: See below, no change. Metformin 500 mg 2 times a day. Trulicity 3.0 mg weekly. Jardiance 25 mg daily.  - Home glucose testing: Check few times a week at different times of the day. - Discussed/ Gave Hypoglycemia treatment plan.  # Consult : not required at this time.   # Annual urine for microalbuminuria/ creatinine ratio, no microalbuminuria currently, continue ACE/ARB /valsartan. Last  Lab Results  Component Value Date   MICRALBCREAT 18.3 04/01/2023    # Foot check  nightly / neuropathy, continue gabapentin, managed by PCP.  # Annual dilated diabetic eye exams.   - Diet: Make healthy diabetic food choices - Life style / activity / exercise: Discussed.  2. Blood pressure  -  BP Readings from Last 1 Encounters:  08/01/23 138/70    - Control is in target.  - No change in current plans.  Managed by PCP.  3. Lipid status / Hyperlipidemia - Last  Lab Results  Component Value Date   LDLCALC 48 04/01/2023   - Continue simvastatin 20 mg daily.  Managed by PCP.  Diagnoses and all orders for this visit:  Controlled type 2 diabetes mellitus with complication, without long-term current use of insulin (HCC) -     POCT glycosylated hemoglobin (Hb A1C) -     Dulaglutide (TRULICITY) 3 MG/0.5ML SOAJ; INJECT 3 MG UNDER THE SKIN ONCE A WEEK -  Basic metabolic panel; Future -     Hemoglobin A1c; Future -     Fructosamine; Future  Other orders -     empagliflozin (JARDIANCE) 25 MG TABS tablet; Take 1 tablet (25 mg total) by mouth daily before breakfast. For diabetes E11.65 and E 11.29 -     metFORMIN (GLUCOPHAGE) 500 MG tablet; Take 1 tablet (500 mg total) by mouth 2 (two) times daily with a meal.    DISPOSITION Follow up in clinic in 3  months suggested.   All questions answered and patient verbalized understanding of the plan.  Jonathan Khiry Pasquariello, MD [facility] [address] [place], [address] [place], [place] [address] Phone # [phone]  At least part of this note was generated using voice recognition software. Inadvertent word errors may have occurred, which were not recognized during the proofreading process.

## 2023-08-02 ENCOUNTER — Ambulatory Visit (INDEPENDENT_AMBULATORY_CARE_PROVIDER_SITE_OTHER): Payer: Medicare Other | Admitting: Internal Medicine

## 2023-08-02 ENCOUNTER — Encounter: Payer: Self-pay | Admitting: Internal Medicine

## 2023-08-02 VITALS — BP 126/62 | HR 92 | Temp 98.0°F | Resp 18 | Ht 74.0 in | Wt 217.2 lb

## 2023-08-02 DIAGNOSIS — F419 Anxiety disorder, unspecified: Secondary | ICD-10-CM

## 2023-08-02 DIAGNOSIS — F32A Depression, unspecified: Secondary | ICD-10-CM

## 2023-08-02 DIAGNOSIS — I10 Essential (primary) hypertension: Secondary | ICD-10-CM

## 2023-08-02 LAB — BASIC METABOLIC PANEL
BUN: 22 mg/dL (ref 6–23)
CO2: 24 meq/L (ref 19–32)
Calcium: 9.7 mg/dL (ref 8.4–10.5)
Chloride: 104 meq/L (ref 96–112)
Creatinine, Ser: 1.55 mg/dL — ABNORMAL HIGH (ref 0.40–1.50)
GFR: 40.58 mL/min — ABNORMAL LOW (ref >60.00)
Glucose, Bld: 157 mg/dL — ABNORMAL HIGH (ref 70–99)
Potassium: 4.2 meq/L (ref 3.5–5.1)
Sodium: 138 meq/L (ref 135–145)

## 2023-08-02 MED ORDER — ESCITALOPRAM OXALATE 10 MG PO TABS: 10.0000 mg | ORAL_TABLET | Freq: Every day | ORAL | 3 refills | Status: DC

## 2023-08-02 NOTE — Progress Notes (Unsigned)
Subjective:    Patient ID: Jonathan [name]., male    DOB: [birth year], 87 y.o.   MRN: [id number]  DOS:  08/02/2023 Type of visit - description: Routine checkup  Missing Vance Gather his only concern is weight loss. He denies chest pain or lower extremity edema Appetite is decreased, when asked he admits to some depression and anxiety related to taking care of his wife. Denies fever or chills. No night sweats. Cough is mild. No hemoptysis. Denies any blood in the stools or in the urine. Extensive weight reviewed.  See below.   Wt Readings from Last 3 Encounters:  08/02/23 217 lb 4 oz (98.5 kg)  08/01/23 217 lb 9.6 oz (98.7 kg)  05/06/23 217 lb (98.4 kg)   07/2022: 225 Lb 03/2022: 230 Lb 08/2021: 231 Lb 2019:  238 Review of Systems See above   Past Medical History:  Diagnosis Date   CAD (coronary artery disease)    Two RCA stents remotely / 3rd RCA stent 2006   Colon polyps    s/p several Cscopes.   COPD (chronic obstructive pulmonary disease) (HCC)    on O2, nocturnal   Diabetes mellitus    dx aprox 2009   ED (erectile dysfunction)    has a vacumm device   Ejection fraction    Hyperlipidemia    dx in 90s   Hypertension    dx in the 77   Hypogonadism male    PVD (peripheral vascular disease) (HCC)    s/p stents at LE 2009, Dr Jacinto Halim   Shortness of breath    O2 Sat dropped to 82% walking on the treadmill, September, 2012    Past Surgical History:  Procedure Laterality Date   APPENDECTOMY     CORONARY STENT INTERVENTION N/A 01/12/2022   Procedure: CORONARY STENT INTERVENTION;  Surgeon: Swaziland, Peter M, MD;  Location: [facility] INVASIVE CV LAB;  Service: Cardiovascular;  Laterality: N/A;   LEFT HEART CATH AND CORONARY ANGIOGRAPHY N/A 03/13/2023   Procedure: LEFT HEART CATH AND CORONARY ANGIOGRAPHY;  Surgeon: Swaziland, Peter M, MD;  Location: [facility] INVASIVE CV LAB;  Service: Cardiovascular;  Laterality: N/A;   RIGHT/LEFT HEART CATH AND CORONARY ANGIOGRAPHY N/A 01/11/2022    Procedure: RIGHT/LEFT HEART CATH AND CORONARY ANGIOGRAPHY;  Surgeon: Lyn Records, MD;  Location: [facility] INVASIVE CV LAB;  Service: Cardiovascular;  Laterality: N/A;   TONSILLECTOMY      Current Outpatient Medications  Medication Instructions   albuterol (VENTOLIN HFA) 108 (90 Base) MCG/ACT inhaler USE 1 TO 2 INHALATIONS EVERY 6 HOURS AS NEEDED FOR WHEEZING OR SHORTNESS OF BREATH   aspirin 81 mg, Oral, Daily,     Blood Glucose Monitoring Suppl (FREESTYLE FREEDOM LITE) w/Device KIT Use Freestyle Freedom Lite meter to check blood sugar twice daily. DX:E11.65   clopidogrel (PLAVIX) 75 mg, Oral, Daily with breakfast   Dulaglutide (TRULICITY) 3 MG/0.5ML SOAJ INJECT 3 MG UNDER THE SKIN ONCE A WEEK   empagliflozin (JARDIANCE) 25 mg, Oral, Daily before breakfast, For diabetes E11.65 and E 11.29   furosemide (LASIX) 40 mg, Oral, Daily   gabapentin (NEURONTIN) 100 MG capsule TAKE TWO CAPSULES BY MOUTH TWICE A DAY FOR NEUROPATHY   glucose blood (FREESTYLE LITE) test strip USE AS INSTRUCTED   hydrOXYzine (ATARAX) 12.5-25 mg, Oral, Every 8 hours PRN   Insulin Pen Needle (SURE COMFORT PEN NEEDLES) 31G X 6 MM MISC Use as directed   metFORMIN (GLUCOPHAGE) 500 mg, Oral, 2 times daily with meals   metoprolol (TOPROL-XL) 100  mg, Oral, Daily   Multiple Vitamin (MULTIVITAMIN WITH MINERALS) TABS tablet 1 tablet, Oral, Daily   ondansetron (ZOFRAN) 4 mg, Oral, Every 8 hours PRN   OXYGEN 2 L/min, Inhalation, Daily at bedtime, PRN during day   potassium chloride SA (KLOR-CON M) 20 MEQ tablet 20 mEq, Oral, Daily   Praluent 75 mg, Subcutaneous, Every 14 days   sacubitril-valsartan (ENTRESTO) 24-26 MG 1 tablet, Oral, 2 times daily   simvastatin (ZOCOR) 20 mg, Oral, Daily   tamsulosin (FLOMAX) 0.4 mg, Oral, Daily after supper   Tiotropium Bromide-Olodaterol (STIOLTO RESPIMAT) 2.5-2.5 MCG/ACT AERS 2 puffs, Inhalation, Daily       Objective:   Physical Exam BP 126/62   Pulse 92   Temp 98 F (36.7 C) (Oral)   Resp  18   Ht 6\' 2"  (1.88 m)   Wt 217 lb 4 oz (98.5 kg)   SpO2 (!) 87% Comment: Room air  BMI 27.89 kg/m  General:   Well developed, NAD, BMI noted.  HEENT:  Normocephalic . Face symmetric, atraumatic Neck: No thyromegaly. Lungs:  CTA B Normal respiratory effort, no intercostal retractions, no accessory muscle use. Heart: RRR,  no murmur.  Abdomen:  Not distended, soft, non-tender. No rebound or rigidity.   Skin: Not pale. Not jaundice Lower extremities: no pretibial edema bilaterally  Neurologic:  alert & oriented X3.  Speech normal, gait appropriate for age and unassisted Psych--  Cognition and judgment appear intact.  Cooperative with normal attention span and concentration.  Behavior appropriate. No anxious or depressed appearing.     Assessment    Assessment DM dx ~2009, complicated by CAD, PVD, mild DM neuropathy. Sees Dr. Lucianne Muss Hypogonadism. Sees Dr. Lucianne Muss HTN Hyperlipidemia COPD, nocturnal O2 prn Back pain, chronic: See visit 10/12/2016 CV: -CAD  S/p  stenting 2 to the RCA remote past and the third RCA stent in  2006, negative stress test in 2012, LVEF 55% on echo 2015 -PVD- stents lower extremity 2009  -02-2023: Chronic systolic and diastolic CHF newly recognized . Cath: Nonobstructive CAD ED Polycythemia: Saw hematology 11-2018, felt to be due to chronic respiratory failure.  Not likely polycythemia vera  PLAN    HTN: Ambulatory BPs in the 120s.  BP today looks good, continue Lasix, metoprolol, potassium, Entresto, check BMP DM: Saw Endo yesterday,A1c was 6.4. COPD, chronic hypoxia: Saw pulmonary 05/06/2023, rec to continue with Stiolto, was recommended to continue oxygen. Upon arrival, O2 sat was 87, with rest 97%. Weight loss: Has been ongoing concern for the patient for the last 5 years, for instance back in 2019 he complained about that, CBC with pathology smear showed no abnormalities, chest x-ray showed no active disease.  Most recent labs showed no anemia,  DM is controlled, TSH is good. Anxiety,  depression PHQ-9: 4. GAD-7: 5.  Although these tests are "normal".  He definitely has anxiety, depression, we had a long conversation, he become tearful, he has caregiver fatigue and stress. Again after long conversation he eventually agreed to start Lexapro 10 mg 1 tablet daily.  Watch for side effects RTC 2 months. 7-15 DM: To see Endo next month.  Ambulatory CBGs good, this morning was 105.  Denies symptoms of low blood sugar. HTN: BP today is very good, continue Lasix, metoprolol, potassium, Entresto, check a BMP  Dyslipidemia: On simvastatin, has not been able to get Praluent lately.  Recent LFTs normal.  Check FLP.  LDL goal less than 70.  Will share results with cardiology Cardiovascular: Since LOV had  an elective cardiac catheterization,  patent stents, rec DAT, statins, lifestyle modifications. COPD, chronic hypoxemia: Currently uses albuterol once or twice a day for shortness of breath w/ good results, previously was recommended  Stiolto by pulmonary. Plan: Prescription printed for Stiolto, 2 puffs daily, albuterol only as needed. RTC 4 months

## 2023-08-02 NOTE — Patient Instructions (Signed)
Start taking escitalopram 10 mg 1 tablet daily for anxiety   GO TO THE LAB : Get the blood work     Next visit with me in 2 months Please schedule it at the front desk

## 2023-08-03 NOTE — Assessment & Plan Note (Signed)
HTN: Ambulatory BPs in the 120s.  BP today looks good, continue Lasix, metoprolol, potassium, Entresto, check BMP DM: Saw Endo yesterday,A1c was 6.4. COPD, chronic hypoxia: Saw pulmonary 05/06/2023, rec to continue with Stiolto, was recommended to continue oxygen. Upon arrival, O2 sat was 87, with rest 97%. Weight loss: Has been ongoing concern for the patient for the last few years. Back in 2019 he already  c/o weight loss so I ordered a CBC with pathology smear was WNL, chest x-ray showed no active disease.   Most recent labs showed no anemia, DM is controlled, TSH is good. Decrease appetite probably related to depression, see next  Anxiety,  depression PHQ-9: scored  4  and  GAD-7 scored 5.   Although above scores  are "normal", he definitely has anxiety, depression, we had a long conversation, he become tearful, he has caregiver fatigue and stress. Again after long conversation he eventually agreed to start Lexapro 10 mg 1 tablet daily.  Watch for side effects RTC 2 months.

## 2023-08-05 ENCOUNTER — Telehealth: Payer: Self-pay | Admitting: Internal Medicine

## 2023-08-05 NOTE — Telephone Encounter (Signed)
LMOM informing Pt of PCP recommendations.  

## 2023-08-05 NOTE — Telephone Encounter (Signed)
Pt states he is very tired and would like to know if pcp could call something in to help. States he is going to [place] this week for a death in the family and does not want to be tired on the drive, please advise.    [id number] [place], [place] - [address] [place] [address] Phone: [phone]  Fax: [phone]

## 2023-08-05 NOTE — Telephone Encounter (Signed)
(  Fatigue is multifactorial) Advise  patient: Unfortunately I do not have a single medication to make him feel more energetic.  If he is going to drive for several hours, recommend to rest well the night  before travel,  stop driving if he feels tired.

## 2023-08-26 ENCOUNTER — Ambulatory Visit: Payer: Medicare Other | Admitting: Medical

## 2023-08-27 ENCOUNTER — Ambulatory Visit: Payer: Medicare Other | Admitting: Internal Medicine

## 2023-08-28 NOTE — Progress Notes (Unsigned)
[facility] [address], [address] [place], [place] [address] [phone]  Date:  08/29/2023   Name:  Jonathan [name].   DOB:  July 30, 1936   MRN:  [id number]  PCP:  Wanda Plump, MD    Chief Complaint: No chief complaint on file.   History of Present Illness:  Jonathan [name]. is a 87 y.o. very pleasant male patient who presents with the following:  Patient seen today with a concern about his finger.  He is a primary patient of my partner Dr. Drue Novel  History of heart disease, peripheral vascular disease, COPD, diabetes, hypertension, CKD  Patient Active Problem List   Diagnosis Date Noted   COPD mixed type (HCC)    Acute on chronic combined systolic and diastolic CHF (congestive heart failure) (HCC) 02/21/2023   CHF (congestive heart failure) (HCC) 02/21/2023   Anxiety and depression 01/31/2022   Chest pain with high risk of acute coronary syndrome    Polycythemia, secondary 08/18/2018   Dyspnea on exertion 01/24/2017   PCP NOTES >>>>>>>>>>>>>>>>>>> 04/03/2016   Coronary artery disease involving native coronary artery of native heart without angina pectoris 01/23/2016   Cramp of both lower extremities 01/23/2016   Chronic kidney disease, stage II (mild) 02/28/2014   Onychomycosis 04/10/2013   Essential tremor 10/14/2012   Annual physical exam 12/11/2011   Vitamin D deficiency 12/11/2011   Back pain, chronic 06/18/2011   Chronic respiratory failure with hypoxia (HCC)    CAD (coronary artery disease)    PVD (peripheral vascular disease) (HCC)    Headache 10/06/2010   Hypogonadism in male 06/21/2010   DMII (diabetes mellitus, type 2) (HCC) 03/17/2010   Hyperlipidemia 03/17/2010   Essential hypertension 03/17/2010    Past Medical History:  Diagnosis Date   CAD (coronary artery disease)    Two RCA stents remotely / 3rd RCA stent 2006   Colon polyps    s/p several Cscopes.   COPD (chronic obstructive pulmonary  disease) (HCC)    on O2, nocturnal   Diabetes mellitus    dx aprox 2009   ED (erectile dysfunction)    has a vacumm device   Ejection fraction    Hyperlipidemia    dx in 90s   Hypertension    dx in the 77   Hypogonadism male    PVD (peripheral vascular disease) (HCC)    s/p stents at LE 2009, Dr Jacinto Halim   Shortness of breath    O2 Sat dropped to 82% walking on the treadmill, September, 2012    Past Surgical History:  Procedure Laterality Date   APPENDECTOMY     CORONARY STENT INTERVENTION N/A 01/12/2022   Procedure: CORONARY STENT INTERVENTION;  Surgeon: Swaziland, Peter M, MD;  Location: [facility] INVASIVE CV LAB;  Service: Cardiovascular;  Laterality: N/A;   LEFT HEART CATH AND CORONARY ANGIOGRAPHY N/A 03/13/2023   Procedure: LEFT HEART CATH AND CORONARY ANGIOGRAPHY;  Surgeon: Swaziland, Peter M, MD;  Location: [facility] INVASIVE CV LAB;  Service: Cardiovascular;  Laterality: N/A;   RIGHT/LEFT HEART CATH AND CORONARY ANGIOGRAPHY N/A 01/11/2022   Procedure: RIGHT/LEFT HEART CATH AND CORONARY ANGIOGRAPHY;  Surgeon: Lyn Records, MD;  Location: [facility] INVASIVE CV LAB;  Service: Cardiovascular;  Laterality: N/A;   TONSILLECTOMY      Social History   Tobacco Use   Smoking status: Former    Current packs/day: 0.00    Average packs/day: 2.0 packs/day for 30.0 years (60.0  ttl pk-yrs)    Types: Cigarettes    Start date: 06/18/1947    Quit date: 06/17/1977    Years since quitting: 46.2   Smokeless tobacco: Never   Tobacco comments:    2 ppd, quit 1978  Vaping Use   Vaping status: Never Used  Substance Use Topics   Alcohol use: Yes    Alcohol/week: 0.0 standard drinks of alcohol    Comment: socially    Drug use: No    Family History  Problem Relation Age of Onset   Heart disease Father    Hypertension Father    Stroke Father    Diabetes Paternal Aunt    Diabetes Maternal Grandmother    Diabetes Other        GM, nephews, many family members   Hyperlipidemia Other        ?   Prostate cancer  Brother    Colon cancer Neg Hx     No Known Allergies  Medication list has been reviewed and updated.  Current Outpatient Medications on File Prior to Visit  Medication Sig Dispense Refill   albuterol (VENTOLIN HFA) 108 (90 Base) MCG/ACT inhaler USE 1 TO 2 INHALATIONS EVERY 6 HOURS AS NEEDED FOR WHEEZING OR SHORTNESS OF BREATH 51 g 3   Alirocumab (PRALUENT) 75 MG/ML SOAJ Inject 1 mL (75 mg total) into the skin every 14 (fourteen) days. 6 mL 3   aspirin 81 MG tablet Take 81 mg by mouth daily.     Blood Glucose Monitoring Suppl (FREESTYLE FREEDOM LITE) w/Device KIT Use Freestyle Freedom Lite meter to check blood sugar twice daily. DX:E11.65 1 kit 0   clopidogrel (PLAVIX) 75 MG tablet Take 1 tablet (75 mg total) by mouth daily with breakfast. 90 tablet 1   Dulaglutide (TRULICITY) 3 MG/0.5ML SOAJ INJECT 3 MG UNDER THE SKIN ONCE A WEEK 6 mL 3   empagliflozin (JARDIANCE) 25 MG TABS tablet Take 1 tablet (25 mg total) by mouth daily before breakfast. For diabetes E11.65 and E 11.29 90 tablet 3   escitalopram (LEXAPRO) 10 MG tablet Take 1 tablet (10 mg total) by mouth daily. 30 tablet 3   furosemide (LASIX) 40 MG tablet Take 1 tablet (40 mg total) by mouth daily. 90 tablet 1   gabapentin (NEURONTIN) 100 MG capsule TAKE TWO CAPSULES BY MOUTH TWICE A DAY FOR NEUROPATHY     glucose blood (FREESTYLE LITE) test strip USE AS INSTRUCTED 100 strip 11   hydrOXYzine (ATARAX) 25 MG tablet Take 0.5-1 tablets (12.5-25 mg total) by mouth every 8 (eight) hours as needed for anxiety. 30 tablet 1   Insulin Pen Needle (SURE COMFORT PEN NEEDLES) 31G X 6 MM MISC Use as directed 100 each 3   metFORMIN (GLUCOPHAGE) 500 MG tablet Take 1 tablet (500 mg total) by mouth 2 (two) times daily with a meal. 180 tablet 3   metoprolol (TOPROL-XL) 200 MG 24 hr tablet Take 100 mg by mouth daily.     Multiple Vitamin (MULTIVITAMIN WITH MINERALS) TABS tablet Take 1 tablet by mouth daily.     ondansetron (ZOFRAN) 4 MG tablet Take 1  tablet (4 mg total) by mouth every 8 (eight) hours as needed for nausea or vomiting. (Patient not taking: Reported on 08/02/2023) 20 tablet 0   OXYGEN Inhale 2 L/min into the lungs at bedtime. PRN during day     potassium chloride SA (KLOR-CON M) 20 MEQ tablet Take 1 tablet (20 mEq total) by mouth daily. 30 tablet 0  sacubitril-valsartan (ENTRESTO) 24-26 MG Take 1 tablet by mouth 2 (two) times daily. 60 tablet 0   simvastatin (ZOCOR) 20 MG tablet Take 20 mg by mouth daily.     tamsulosin (FLOMAX) 0.4 MG CAPS capsule Take 1 capsule (0.4 mg total) by mouth daily after supper. 90 capsule 1   Tiotropium Bromide-Olodaterol (STIOLTO RESPIMAT) 2.5-2.5 MCG/ACT AERS Inhale 2 puffs into the lungs daily. 12 g 4   No current facility-administered medications on file prior to visit.    Review of Systems:  As per HPI- otherwise negative.   Physical Examination: There were no vitals filed for this visit. There were no vitals filed for this visit. There is no height or weight on file to calculate BMI. Ideal Body Weight:    GEN: no acute distress. HEENT: Atraumatic, Normocephalic.  Ears and Nose: No external deformity. CV: RRR, No M/G/R. No JVD. No thrill. No extra heart sounds. PULM: CTA B, no wheezes, crackles, rhonchi. No retractions. No resp. distress. No accessory muscle use. ABD: S, NT, ND, +BS. No rebound. No HSM. EXTR: No c/c/e PSYCH: Normally interactive. Conversant.    Assessment and Plan: ***  Signed Abbe Amsterdam, MD

## 2023-08-29 ENCOUNTER — Encounter: Payer: Self-pay | Admitting: Family Medicine

## 2023-08-29 ENCOUNTER — Ambulatory Visit (INDEPENDENT_AMBULATORY_CARE_PROVIDER_SITE_OTHER): Payer: Medicare Other | Admitting: Family Medicine

## 2023-08-29 VITALS — BP 122/72 | HR 72 | Ht 74.0 in | Wt 213.0 lb

## 2023-08-29 VITALS — BP 122/72 | HR 72 | Temp 97.8°F | Resp 18 | Ht 74.0 in | Wt 213.9 lb

## 2023-08-29 DIAGNOSIS — M65341 Trigger finger, right ring finger: Secondary | ICD-10-CM

## 2023-08-29 MED ORDER — TRIAMCINOLONE ACETONIDE 40 MG/ML IJ SUSP
20.0000 mg | Freq: Once | INTRAMUSCULAR | Status: AC
Start: 2023-08-29 — End: 2023-08-29
  Administered 2023-08-29: 20 mg via INTRA_ARTICULAR

## 2023-08-29 NOTE — Patient Instructions (Signed)
Please head over to our sports medicine clinic in [address]- they will check out your finger and I hope be able to fix it for you!

## 2023-08-29 NOTE — Progress Notes (Signed)
CHIEF COMPLAINT: No chief complaint on file.  _____________________________________________________________ SUBJECTIVE  HPI  Pt is a 87 y.o. male here for evaluation of R 4th finger trigger injection  9-10 days woke up with it. Has never had something like this before Denies increase in activity Does have a history of diabetes, insulin dependent, updated A1c pending, last hgba1c at 7.4 10/2022 Reports pain with catching/locking sensation. No numbness/tingling Seen at PCP's office a few minutes ago, referred to our clinic today for elective trigger finger CSI  ------------------------------------------------------------------------------------------------------ Past Medical History:  Diagnosis Date   CAD (coronary artery disease)    Two RCA stents remotely / 3rd RCA stent 2006   Colon polyps    s/p several Cscopes.   COPD (chronic obstructive pulmonary disease) (HCC)    on O2, nocturnal   Diabetes mellitus    dx aprox 2009   ED (erectile dysfunction)    has a vacumm device   Ejection fraction    Hyperlipidemia    dx in 90s   Hypertension    dx in the 35   Hypogonadism male    PVD (peripheral vascular disease) (HCC)    s/p stents at LE 2009, Dr Jacinto Halim   Shortness of breath    O2 Sat dropped to 82% walking on the treadmill, September, 2012    Past Surgical History:  Procedure Laterality Date   APPENDECTOMY     CORONARY STENT INTERVENTION N/A 01/12/2022   Procedure: CORONARY STENT INTERVENTION;  Surgeon: Swaziland, Peter M, MD;  Location: [facility] INVASIVE CV LAB;  Service: Cardiovascular;  Laterality: N/A;   LEFT HEART CATH AND CORONARY ANGIOGRAPHY N/A 03/13/2023   Procedure: LEFT HEART CATH AND CORONARY ANGIOGRAPHY;  Surgeon: Swaziland, Peter M, MD;  Location: [facility] INVASIVE CV LAB;  Service: Cardiovascular;  Laterality: N/A;   RIGHT/LEFT HEART CATH AND CORONARY ANGIOGRAPHY N/A 01/11/2022   Procedure: RIGHT/LEFT HEART CATH AND CORONARY ANGIOGRAPHY;  Surgeon: Lyn Records, MD;  Location:  [facility] INVASIVE CV LAB;  Service: Cardiovascular;  Laterality: N/A;   TONSILLECTOMY        Outpatient Encounter Medications as of 08/29/2023  Medication Sig Note   albuterol (VENTOLIN HFA) 108 (90 Base) MCG/ACT inhaler USE 1 TO 2 INHALATIONS EVERY 6 HOURS AS NEEDED FOR WHEEZING OR SHORTNESS OF BREATH    Alirocumab (PRALUENT) 75 MG/ML SOAJ Inject 1 mL (75 mg total) into the skin every 14 (fourteen) days.    aspirin 81 MG tablet Take 81 mg by mouth daily.    Blood Glucose Monitoring Suppl (FREESTYLE FREEDOM LITE) w/Device KIT Use Freestyle Freedom Lite meter to check blood sugar twice daily. DX:E11.65    clopidogrel (PLAVIX) 75 MG tablet Take 1 tablet (75 mg total) by mouth daily with breakfast.    Dulaglutide (TRULICITY) 3 MG/0.5ML SOAJ INJECT 3 MG UNDER THE SKIN ONCE A WEEK    empagliflozin (JARDIANCE) 25 MG TABS tablet Take 1 tablet (25 mg total) by mouth daily before breakfast. For diabetes E11.65 and E 11.29    escitalopram (LEXAPRO) 10 MG tablet Take 1 tablet (10 mg total) by mouth daily.    furosemide (LASIX) 40 MG tablet Take 1 tablet (40 mg total) by mouth daily.    gabapentin (NEURONTIN) 100 MG capsule TAKE TWO CAPSULES BY MOUTH TWICE A DAY FOR NEUROPATHY    glucose blood (FREESTYLE LITE) test strip USE AS INSTRUCTED    hydrOXYzine (ATARAX) 25 MG tablet Take 0.5-1 tablets (12.5-25 mg total) by mouth every 8 (eight) hours as needed for anxiety. 04/01/2023:  prn   Insulin Pen Needle (SURE COMFORT PEN NEEDLES) 31G X 6 MM MISC Use as directed    metFORMIN (GLUCOPHAGE) 500 MG tablet Take 1 tablet (500 mg total) by mouth 2 (two) times daily with a meal.    metoprolol (TOPROL-XL) 200 MG 24 hr tablet Take 100 mg by mouth daily.    Multiple Vitamin (MULTIVITAMIN WITH MINERALS) TABS tablet Take 1 tablet by mouth daily.    ondansetron (ZOFRAN) 4 MG tablet Take 1 tablet (4 mg total) by mouth every 8 (eight) hours as needed for nausea or vomiting.    OXYGEN Inhale 2 L/min into the lungs at bedtime. PRN  during day    potassium chloride SA (KLOR-CON M) 20 MEQ tablet Take 1 tablet (20 mEq total) by mouth daily.    sacubitril-valsartan (ENTRESTO) 24-26 MG Take 1 tablet by mouth 2 (two) times daily.    simvastatin (ZOCOR) 20 MG tablet Take 20 mg by mouth daily.    tamsulosin (FLOMAX) 0.4 MG CAPS capsule Take 1 capsule (0.4 mg total) by mouth daily after supper.    Tiotropium Bromide-Olodaterol (STIOLTO RESPIMAT) 2.5-2.5 MCG/ACT AERS Inhale 2 puffs into the lungs daily.    No facility-administered encounter medications on file as of 08/29/2023.    ------------------------------------------------------------------------------------------------------  _____________________________________________________________ OBJECTIVE  PHYSICAL EXAM  Today's Vitals   08/29/23 1054  BP: 122/72  Pulse: 72  Weight: 213 lb (96.6 kg)  Height: 6\' 2"  (1.88 m)   Body mass index is 27.35 kg/m.   reviewed  General: A+Ox3, no acute distress, well-nourished, appropriate affect CV: pulses 2+ regular, nondiaphoretic, no peripheral edema, cap refill <2sec Lungs: no audible wheezing, non-labored breathing, bilateral chest rise/fall, nontachypneic Skin: warm, well-perfused, non-icteric, no susp lesions or rashes Neuro: focal deficit. Sensation intact, muscle tone wnl, no atrophy Psych: no signs of depression or anxiety MSK:  Radial pulses intact, grip strength 5/5. Is able to form a full fist. No catching/locking appreciated on exam today. Hand/finger tenderness to palpation   _____________________________________________________________ ASSESSMENT/PLAN Diagnoses and all orders for this visit:  Trigger ring finger of right hand -     triamcinolone acetonide (KENALOG-40) injection 20 mg   Trigger point CSI Procedure explained to patient as well as options for management.  Discussed that procedure is elective and that there are alternate nonprocedural methods of management including OT, HEP, gentle ROM, as  well as other OTC remedies to help with discomfort. Options for management reviewed, and patient elected to proceed with trigger ring finger CSI  PROCEDURE: risks & benefits of R ring trigger finger injection reviewed.  Consent obtained.  Time-out completed.  Patient prepped and draped in the normal fashion.  Area cleansed with alcohol.  Ethyl chloride spray used to anesthetize the skin.  Solution of 0.5 mL 1% lidocaine with 0.5 mL kenalog 40mg /ml injected into ring finger A1 pulley.  Patient tolerated procedure well without any complications.  Area covered with adhesive bandage.  Post-procedure care reviewed.  All questions answered.  All questions answered. Return precautions discussed. Patient verbalized understanding and is in agreement with plan. Follow-up as needed  Electronically signed by: Burna Forts, MD 08/29/2023 10:48 AM

## 2023-09-04 ENCOUNTER — Other Ambulatory Visit: Payer: Self-pay | Admitting: Endocrinology

## 2023-09-04 DIAGNOSIS — E118 Type 2 diabetes mellitus with unspecified complications: Secondary | ICD-10-CM

## 2023-09-04 MED ORDER — TRULICITY 3 MG/0.5ML ~~LOC~~ SOAJ: SUBCUTANEOUS | 3 refills | Status: DC

## 2023-09-08 ENCOUNTER — Emergency Department (HOSPITAL_COMMUNITY): Payer: TRICARE For Life (TFL)

## 2023-09-08 ENCOUNTER — Other Ambulatory Visit: Payer: Self-pay

## 2023-09-08 ENCOUNTER — Inpatient Hospital Stay (HOSPITAL_COMMUNITY)
Admission: EM | Admit: 2023-09-08 | Discharge: 2023-09-23 | DRG: 189 | Disposition: A | Discharge status: Home Health | Payer: Medicare Other | Attending: Family Medicine | Admitting: Family Medicine

## 2023-09-08 DIAGNOSIS — N1832 Chronic kidney disease, stage 3b: Secondary | ICD-10-CM | POA: Insufficient documentation

## 2023-09-08 DIAGNOSIS — Z7902 Long term (current) use of antithrombotics/antiplatelets: Secondary | ICD-10-CM

## 2023-09-08 DIAGNOSIS — E291 Testicular hypofunction: Secondary | ICD-10-CM | POA: Diagnosis present

## 2023-09-08 DIAGNOSIS — I5042 Chronic combined systolic (congestive) and diastolic (congestive) heart failure: Secondary | ICD-10-CM | POA: Diagnosis present

## 2023-09-08 DIAGNOSIS — E1165 Type 2 diabetes mellitus with hyperglycemia: Secondary | ICD-10-CM | POA: Diagnosis present

## 2023-09-08 DIAGNOSIS — E669 Obesity, unspecified: Secondary | ICD-10-CM | POA: Diagnosis present

## 2023-09-08 DIAGNOSIS — R042 Hemoptysis: Principal | ICD-10-CM | POA: Diagnosis present

## 2023-09-08 DIAGNOSIS — I700 Atherosclerosis of aorta: Secondary | ICD-10-CM | POA: Diagnosis not present

## 2023-09-08 DIAGNOSIS — J189 Pneumonia, unspecified organism: Secondary | ICD-10-CM | POA: Diagnosis not present

## 2023-09-08 DIAGNOSIS — I509 Heart failure, unspecified: Secondary | ICD-10-CM

## 2023-09-08 DIAGNOSIS — Z9861 Coronary angioplasty status: Secondary | ICD-10-CM

## 2023-09-08 DIAGNOSIS — E1151 Type 2 diabetes mellitus with diabetic peripheral angiopathy without gangrene: Secondary | ICD-10-CM | POA: Diagnosis present

## 2023-09-08 DIAGNOSIS — Z79899 Other long term (current) drug therapy: Secondary | ICD-10-CM

## 2023-09-08 DIAGNOSIS — C3490 Malignant neoplasm of unspecified part of unspecified bronchus or lung: Secondary | ICD-10-CM

## 2023-09-08 DIAGNOSIS — R0902 Hypoxemia: Secondary | ICD-10-CM | POA: Diagnosis not present

## 2023-09-08 DIAGNOSIS — Z7984 Long term (current) use of oral hypoglycemic drugs: Secondary | ICD-10-CM

## 2023-09-08 DIAGNOSIS — Z1152 Encounter for screening for COVID-19: Secondary | ICD-10-CM

## 2023-09-08 DIAGNOSIS — R140 Abdominal distension (gaseous): Secondary | ICD-10-CM | POA: Diagnosis not present

## 2023-09-08 DIAGNOSIS — E1122 Type 2 diabetes mellitus with diabetic chronic kidney disease: Secondary | ICD-10-CM | POA: Diagnosis not present

## 2023-09-08 DIAGNOSIS — F32A Depression, unspecified: Secondary | ICD-10-CM | POA: Diagnosis present

## 2023-09-08 DIAGNOSIS — Z7189 Other specified counseling: Secondary | ICD-10-CM | POA: Diagnosis not present

## 2023-09-08 DIAGNOSIS — E119 Type 2 diabetes mellitus without complications: Secondary | ICD-10-CM

## 2023-09-08 DIAGNOSIS — I10 Essential (primary) hypertension: Secondary | ICD-10-CM | POA: Diagnosis not present

## 2023-09-08 DIAGNOSIS — I2510 Atherosclerotic heart disease of native coronary artery without angina pectoris: Secondary | ICD-10-CM | POA: Diagnosis present

## 2023-09-08 DIAGNOSIS — Z9049 Acquired absence of other specified parts of digestive tract: Secondary | ICD-10-CM

## 2023-09-08 DIAGNOSIS — N529 Male erectile dysfunction, unspecified: Secondary | ICD-10-CM | POA: Diagnosis present

## 2023-09-08 DIAGNOSIS — R9389 Abnormal findings on diagnostic imaging of other specified body structures: Secondary | ICD-10-CM | POA: Diagnosis not present

## 2023-09-08 DIAGNOSIS — Z794 Long term (current) use of insulin: Secondary | ICD-10-CM

## 2023-09-08 DIAGNOSIS — J439 Emphysema, unspecified: Secondary | ICD-10-CM | POA: Diagnosis not present

## 2023-09-08 DIAGNOSIS — J9601 Acute respiratory failure with hypoxia: Secondary | ICD-10-CM

## 2023-09-08 DIAGNOSIS — Z860100 Personal history of colon polyps, unspecified: Secondary | ICD-10-CM

## 2023-09-08 DIAGNOSIS — Z87891 Personal history of nicotine dependence: Secondary | ICD-10-CM

## 2023-09-08 DIAGNOSIS — J9811 Atelectasis: Secondary | ICD-10-CM | POA: Diagnosis not present

## 2023-09-08 DIAGNOSIS — T380X5A Adverse effect of glucocorticoids and synthetic analogues, initial encounter: Secondary | ICD-10-CM | POA: Diagnosis present

## 2023-09-08 DIAGNOSIS — R918 Other nonspecific abnormal finding of lung field: Secondary | ICD-10-CM

## 2023-09-08 DIAGNOSIS — I130 Hypertensive heart and chronic kidney disease with heart failure and stage 1 through stage 4 chronic kidney disease, or unspecified chronic kidney disease: Secondary | ICD-10-CM | POA: Diagnosis present

## 2023-09-08 DIAGNOSIS — Z9981 Dependence on supplemental oxygen: Secondary | ICD-10-CM

## 2023-09-08 DIAGNOSIS — J9600 Acute respiratory failure, unspecified whether with hypoxia or hypercapnia: Secondary | ICD-10-CM | POA: Diagnosis not present

## 2023-09-08 DIAGNOSIS — Z6829 Body mass index (BMI) 29.0-29.9, adult: Secondary | ICD-10-CM

## 2023-09-08 DIAGNOSIS — G4700 Insomnia, unspecified: Secondary | ICD-10-CM | POA: Diagnosis present

## 2023-09-08 DIAGNOSIS — J432 Centrilobular emphysema: Secondary | ICD-10-CM | POA: Diagnosis not present

## 2023-09-08 DIAGNOSIS — Z833 Family history of diabetes mellitus: Secondary | ICD-10-CM

## 2023-09-08 DIAGNOSIS — J90 Pleural effusion, not elsewhere classified: Secondary | ICD-10-CM | POA: Diagnosis not present

## 2023-09-08 DIAGNOSIS — Z66 Do not resuscitate: Secondary | ICD-10-CM | POA: Diagnosis not present

## 2023-09-08 DIAGNOSIS — R059 Cough, unspecified: Secondary | ICD-10-CM | POA: Diagnosis not present

## 2023-09-08 DIAGNOSIS — E785 Hyperlipidemia, unspecified: Secondary | ICD-10-CM | POA: Diagnosis present

## 2023-09-08 DIAGNOSIS — J984 Other disorders of lung: Secondary | ICD-10-CM | POA: Diagnosis not present

## 2023-09-08 DIAGNOSIS — J449 Chronic obstructive pulmonary disease, unspecified: Secondary | ICD-10-CM | POA: Diagnosis present

## 2023-09-08 DIAGNOSIS — R911 Solitary pulmonary nodule: Secondary | ICD-10-CM | POA: Diagnosis not present

## 2023-09-08 DIAGNOSIS — Z515 Encounter for palliative care: Secondary | ICD-10-CM | POA: Diagnosis not present

## 2023-09-08 DIAGNOSIS — Z7982 Long term (current) use of aspirin: Secondary | ICD-10-CM

## 2023-09-08 DIAGNOSIS — Z8249 Family history of ischemic heart disease and other diseases of the circulatory system: Secondary | ICD-10-CM

## 2023-09-08 DIAGNOSIS — R0602 Shortness of breath: Secondary | ICD-10-CM | POA: Diagnosis not present

## 2023-09-08 DIAGNOSIS — J9621 Acute and chronic respiratory failure with hypoxia: Secondary | ICD-10-CM | POA: Diagnosis not present

## 2023-09-08 DIAGNOSIS — D72829 Elevated white blood cell count, unspecified: Secondary | ICD-10-CM | POA: Diagnosis not present

## 2023-09-08 DIAGNOSIS — F419 Anxiety disorder, unspecified: Secondary | ICD-10-CM | POA: Diagnosis present

## 2023-09-08 DIAGNOSIS — J440 Chronic obstructive pulmonary disease with acute lower respiratory infection: Secondary | ICD-10-CM | POA: Diagnosis present

## 2023-09-08 DIAGNOSIS — N179 Acute kidney failure, unspecified: Secondary | ICD-10-CM | POA: Diagnosis not present

## 2023-09-08 DIAGNOSIS — Z7985 Long-term (current) use of injectable non-insulin antidiabetic drugs: Secondary | ICD-10-CM

## 2023-09-08 LAB — COMPREHENSIVE METABOLIC PANEL
ALT: 19 U/L (ref 0–44)
AST: 20 U/L (ref 15–41)
Albumin: 3.3 g/dL — ABNORMAL LOW (ref 3.5–5.0)
Alkaline Phosphatase: 91 U/L (ref 38–126)
Anion gap: 11 (ref 5–15)
BUN: 28 mg/dL — ABNORMAL HIGH (ref 8–23)
CO2: 21 mmol/L — ABNORMAL LOW (ref 22–32)
Calcium: 9.2 mg/dL (ref 8.9–10.3)
Chloride: 106 mmol/L (ref 98–111)
Creatinine, Ser: 1.55 mg/dL — ABNORMAL HIGH (ref 0.61–1.24)
GFR, Estimated: 44 mL/min — ABNORMAL LOW (ref >60)
Glucose, Bld: 165 mg/dL — ABNORMAL HIGH (ref 70–99)
Potassium: 4.1 mmol/L (ref 3.5–5.1)
Sodium: 138 mmol/L (ref 135–145)
Total Bilirubin: 0.3 mg/dL (ref <1.2)
Total Protein: 7.2 g/dL (ref 6.5–8.1)

## 2023-09-08 LAB — CBC WITH DIFFERENTIAL/PLATELET
Abs Immature Granulocytes: 0.06 10*3/uL (ref 0.00–0.07)
Basophils Absolute: 0.1 10*3/uL (ref 0.0–0.1)
Basophils Relative: 1 %
Eosinophils Absolute: 0.3 10*3/uL (ref 0.0–0.5)
Eosinophils Relative: 2 %
HCT: 47.4 % (ref 39.0–52.0)
Hemoglobin: 15.8 g/dL (ref 13.0–17.0)
Immature Granulocytes: 0 %
Lymphocytes Relative: 26 %
Lymphs Abs: 4.1 10*3/uL — ABNORMAL HIGH (ref 0.7–4.0)
MCH: 32.4 pg (ref 26.0–34.0)
MCHC: 33.3 g/dL (ref 30.0–36.0)
MCV: 97.1 fL (ref 80.0–100.0)
Monocytes Absolute: 1.4 10*3/uL — ABNORMAL HIGH (ref 0.1–1.0)
Monocytes Relative: 8 %
Neutro Abs: 10.2 10*3/uL — ABNORMAL HIGH (ref 1.7–7.7)
Neutrophils Relative %: 63 %
Platelets: 309 10*3/uL (ref 150–400)
RBC: 4.88 MIL/uL (ref 4.22–5.81)
RDW: 14.6 % (ref 11.5–15.5)
WBC: 16.1 10*3/uL — ABNORMAL HIGH (ref 4.0–10.5)
nRBC: 0 % (ref 0.0–0.2)

## 2023-09-08 LAB — TYPE AND SCREEN
ABO/RH(D): O POS
Antibody Screen: NEGATIVE

## 2023-09-08 MED ORDER — SODIUM CHLORIDE 0.9 % IV SOLN
500.0000 mg | Freq: Once | INTRAVENOUS | Status: AC
  Administered 2023-09-09: 500 mg via INTRAVENOUS
  Filled 2023-09-08: qty 5

## 2023-09-08 MED ORDER — LACTATED RINGERS IV BOLUS: 1000.0000 mL | Freq: Once | INTRAVENOUS | Status: DC

## 2023-09-08 MED ORDER — SODIUM CHLORIDE 0.9 % IV SOLN
1.0000 g | Freq: Once | INTRAVENOUS | Status: AC
  Administered 2023-09-09: 1 g via INTRAVENOUS
  Filled 2023-09-08: qty 10

## 2023-09-08 MED ORDER — IOHEXOL 350 MG/ML SOLN
75.0000 mL | Freq: Once | INTRAVENOUS | Status: AC | PRN
  Administered 2023-09-08: 75 mL via INTRAVENOUS

## 2023-09-08 NOTE — ED Provider Notes (Signed)
[place] EMERGENCY DEPARTMENT AT [facility] Provider Note  HPI   Jonathan [name]. is a 87 y.o. male patient with a PMHx of h CAD s/p PCI x2 12/2021, PVD s/p L SFA stent 2009, HTN, COPD on noct O2, DM, and HLD who is here today with hemoptysis.  Triage notes that the patient has been having a cold for the past couple of days, however patient denies this for me.  He states that he has been feeling fine, and then today,developing a cough.  This ultimately turned into hemoptysis, and he feels that he is coughing up blood clots.  This is never happened before.  He has had no fevers chills chest pain shortness of breath abdominal pain back pain  ROS Negative except as per HPI   Medical Decision Making   Upon presentation, the patient is afebrile, however he is tachypneic, hypertensive to the 150s 160s.  Patient is actively coughing up medium to large sized blood clots.  He is saturating around 88 to 90% on 4 L nasal cannula.  Of note he does use 2 L nasal cannula at night, and as needed oxygen during the day as needed.  He lives home with his wife who has dementia who he takes care of.  It appears that he is on Plavix.  We will ensure adequate IV access at this point.  Patient is medium risk on the Geneva score calculator, we will go ahead and get a PET scan given the fact that patient is hypoxic, tachypneic, coughing up blood, and has a smoking history we can also evaluate for any pulmonary mass such as cancer.  Will test for COVID and flu.  Patient labs are coming back, does have leukocytosis, creatinine is 1.55 appears to be about baseline, we also type and screen this individual in case he requires any additional blood. Pt will be signed out pending the CT PE scan.    Clinical Course as of 09/08/23 2320  Sun Sep 08, 2023  2213 h CAD s/p PCI x2 12/2021, PVD s/p L SFA stent 2009, HTN, COPD on noct O2, DM, and HLD  [JL]  2320 CT Angio Chest PE W and/or Wo Contrast [JL]    Clinical  Course User Index [JL] Gunnar Bulla, MD      No diagnosis found.  @DISPOSITION @  Rx / DC Orders ED Discharge Orders     None        Past Medical History:  Diagnosis Date  . CAD (coronary artery disease)    Two RCA stents remotely / 3rd RCA stent 2006  . Colon polyps    s/p several Cscopes.  Marland Kitchen COPD (chronic obstructive pulmonary disease) (HCC)    on O2, nocturnal  . Diabetes mellitus    dx aprox 2009  . ED (erectile dysfunction)    has a vacumm device  . Ejection fraction   . Hyperlipidemia    dx in 90s  . Hypertension    dx in the 90  . Hypogonadism male   . PVD (peripheral vascular disease) (HCC)    s/p stents at LE 2009, Dr Jacinto Halim  . Shortness of breath    O2 Sat dropped to 82% walking on the treadmill, September, 2012   Past Surgical History:  Procedure Laterality Date  . APPENDECTOMY    . CORONARY STENT INTERVENTION N/A 01/12/2022   Procedure: CORONARY STENT INTERVENTION;  Surgeon: Swaziland, Peter M, MD;  Location: [facility] INVASIVE CV LAB;  Service: Cardiovascular;  Laterality: N/A;  . LEFT HEART CATH AND CORONARY ANGIOGRAPHY N/A 03/13/2023   Procedure: LEFT HEART CATH AND CORONARY ANGIOGRAPHY;  Surgeon: Swaziland, Peter M, MD;  Location: [facility] INVASIVE CV LAB;  Service: Cardiovascular;  Laterality: N/A;  . RIGHT/LEFT HEART CATH AND CORONARY ANGIOGRAPHY N/A 01/11/2022   Procedure: RIGHT/LEFT HEART CATH AND CORONARY ANGIOGRAPHY;  Surgeon: Lyn Records, MD;  Location: [facility] INVASIVE CV LAB;  Service: Cardiovascular;  Laterality: N/A;  . TONSILLECTOMY     Family History  Problem Relation Age of Onset  . Heart disease Father   . Hypertension Father   . Stroke Father   . Diabetes Paternal Aunt   . Diabetes Maternal Grandmother   . Diabetes Other        GM, nephews, many family members  . Hyperlipidemia Other        ?  . Prostate cancer Brother   . Colon cancer Neg Hx    Social History   Socioeconomic History  . Marital status: Married    Spouse name: Cerrella   .  Number of children: 6  . Years of education: Not on file  . Highest education level: Not on file  Occupational History  . 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 Smoking status: Former    Current packs/day: 0.00    Average packs/day: 2.0 packs/day for 30.0 years (60.0 ttl pk-yrs)    Types: Cigarettes    Start date: 06/18/1947    Quit date: 06/17/1977    Years since quitting: 46.2  . Smokeless tobacco: Never  . Tobacco comments:    2 ppd, quit 1978  Vaping Use  . Vaping status: Never Used  Substance and Sexual Activity  . Alcohol use: Yes    Alcohol/week: 0.0 standard drinks of alcohol    Comment: socially   . Drug use: No  . Sexual activity: Yes  Other Topics Concern  . Not on file  Social History Narrative   4 children (lost 1 son)   Wife has 2 children                Social Drivers of Corporate investment banker Strain: Low Risk  (07/20/2021)   Overall Financial Resource Strain (CARDIA)   . Difficulty of Paying Living Expenses: Not hard at all  Food Insecurity: No Food Insecurity (02/21/2023)   Hunger Vital Sign   . Worried About Programme researcher, broadcasting/film/video in the Last Year: Never true   . Ran Out of Food in the Last Year: Never true  Transportation Needs: No Transportation Needs (02/21/2023)   PRAPARE - Transportation   . Lack of Transportation (Medical): No   . Lack of Transportation (Non-Medical): No  Physical Activity: Inactive (07/20/2021)   Exercise Vital Sign   . Days of Exercise per Week: 0 days   . Minutes of Exercise per Session: 0 min  Stress: No Stress Concern Present (07/20/2021)   [employer] of Occupational Health - Occupational Stress Questionnaire   . Feeling of Stress : Not at all  Social Connections: Socially Integrated (07/20/2021)   Social Connection and Isolation Panel [NHANES]   . Frequency of Communication with Friends and Family: More than three times a week   . Frequency of Social Gatherings with Friends  and Family: More than three times a week   . Attends Religious Services: More than 4 times per year   . Active Member of Clubs or Organizations: Yes   .  Attends Banker Meetings: More than 4 times per year   . Marital Status: Married  Catering manager Violence: Not At Risk (02/21/2023)   Humiliation, Afraid, Rape, and Kick questionnaire   . Fear of Current or Ex-Partner: No   . Emotionally Abused: No   . Physically Abused: No   . Sexually Abused: No     Physical Exam   Vitals:   09/08/23 2147  BP: (!) 161/93  Pulse: 82  Resp: (!) 22  Temp: 98.1 F (36.7 C)  TempSrc: Oral  SpO2: 90%  Weight: 95.3 kg  Height: 6\' 2"  (1.88 m)    Physical Exam   Procedures   If procedures were preformed on this patient, they are listed below:  Procedures  The patient was seen, evaluated, and treated in conjunction with the attending physician, who voiced agreement in the care provided.  Note generated using Dragon voice dictation software and may contain dictation errors. Please contact me for any clarification or with any questions.   Electronically signed by:  Osvaldo Shipper, M.D. (PGY-2)    Gunnar Bulla, MD 09/08/23 2258    Glynn Octave, MD 09/08/23 [phone]

## 2023-09-08 NOTE — ED Provider Notes (Addendum)
Care assumed from Dr. Miguel Dibble and Dr. Fredderick Phenix.  Patient with frank hemoptysis for the past 1 day.  Stable vitals but hypoxic on room air requiring 5 L nasal cannula oxygen to maintain saturations around 90%.  He is on Plavix.  CT scan pending to evaluate for pulmonary embolism versus mass versus pneumonia.  CT negative for pulmonary embolism but does show masslike density concerning for pneumonia.  Will treat as such with antibiotics after cultures are obtained.  Discussed with patient we will treat this as pneumonia for now but needs follow-up to ensure this is not a pulmonary mass.  Given degree of hemoptysis and oxygen requirements, will discuss with critical care.   D/w Dr. Lonzo Candy of critical care.   .Critical Care  Performed by: Glynn Octave, MD Authorized by: Glynn Octave, MD   Critical care provider statement:    Critical care time (minutes):  35   Critical care time was exclusive of:  Separately billable procedures and treating other patients   Critical care was necessary to treat or prevent imminent or life-threatening deterioration of the following conditions:  Respiratory failure (hemoptysis)   Critical care was time spent personally by me on the following activities:  Development of treatment plan with patient or surrogate, discussions with consultants, evaluation of patient's response to treatment, examination of patient, ordering and review of laboratory studies, ordering and review of radiographic studies, ordering and performing treatments and interventions, pulse oximetry, re-evaluation of patient's condition, review of old charts, blood draw for specimens and obtaining history from patient or surrogate   I assumed direction of critical care for this patient from another provider in my specialty: no     Care discussed with: admitting provider        Glynn Octave, MD 09/08/23 2344    Glynn Octave, MD 09/08/23 2345

## 2023-09-08 NOTE — ED Notes (Signed)
Patient back from imaging

## 2023-09-08 NOTE — ED Triage Notes (Addendum)
Pt BIB ems with complaints of SOB. Pt states has had a cold for a few days and today started to cough up blood. Per EMS pt was not aware that he was coughing up blood until he  saw the blood on his mask. Lung sounds clear. Pt states chest pain only when coughing. Pt sats 87% on Room air. Pt put on 4l [place] by EMS. Pt has oxygen at home to use as needed.

## 2023-09-09 DIAGNOSIS — Z7189 Other specified counseling: Secondary | ICD-10-CM | POA: Diagnosis not present

## 2023-09-09 DIAGNOSIS — G4700 Insomnia, unspecified: Secondary | ICD-10-CM | POA: Diagnosis present

## 2023-09-09 DIAGNOSIS — Z515 Encounter for palliative care: Secondary | ICD-10-CM | POA: Diagnosis not present

## 2023-09-09 DIAGNOSIS — I700 Atherosclerosis of aorta: Secondary | ICD-10-CM | POA: Diagnosis not present

## 2023-09-09 DIAGNOSIS — J90 Pleural effusion, not elsewhere classified: Secondary | ICD-10-CM | POA: Diagnosis not present

## 2023-09-09 DIAGNOSIS — I130 Hypertensive heart and chronic kidney disease with heart failure and stage 1 through stage 4 chronic kidney disease, or unspecified chronic kidney disease: Secondary | ICD-10-CM | POA: Diagnosis present

## 2023-09-09 DIAGNOSIS — E669 Obesity, unspecified: Secondary | ICD-10-CM | POA: Diagnosis present

## 2023-09-09 DIAGNOSIS — F32A Depression, unspecified: Secondary | ICD-10-CM | POA: Diagnosis present

## 2023-09-09 DIAGNOSIS — J440 Chronic obstructive pulmonary disease with acute lower respiratory infection: Secondary | ICD-10-CM | POA: Diagnosis present

## 2023-09-09 DIAGNOSIS — E1151 Type 2 diabetes mellitus with diabetic peripheral angiopathy without gangrene: Secondary | ICD-10-CM | POA: Diagnosis present

## 2023-09-09 DIAGNOSIS — J9601 Acute respiratory failure with hypoxia: Secondary | ICD-10-CM | POA: Diagnosis not present

## 2023-09-09 DIAGNOSIS — R042 Hemoptysis: Principal | ICD-10-CM | POA: Diagnosis present

## 2023-09-09 DIAGNOSIS — E1165 Type 2 diabetes mellitus with hyperglycemia: Secondary | ICD-10-CM | POA: Diagnosis present

## 2023-09-09 DIAGNOSIS — J9600 Acute respiratory failure, unspecified whether with hypoxia or hypercapnia: Secondary | ICD-10-CM | POA: Diagnosis not present

## 2023-09-09 DIAGNOSIS — Z1152 Encounter for screening for COVID-19: Secondary | ICD-10-CM | POA: Diagnosis not present

## 2023-09-09 DIAGNOSIS — J984 Other disorders of lung: Secondary | ICD-10-CM | POA: Diagnosis not present

## 2023-09-09 DIAGNOSIS — E1122 Type 2 diabetes mellitus with diabetic chronic kidney disease: Secondary | ICD-10-CM | POA: Diagnosis present

## 2023-09-09 DIAGNOSIS — R911 Solitary pulmonary nodule: Secondary | ICD-10-CM | POA: Diagnosis not present

## 2023-09-09 DIAGNOSIS — N179 Acute kidney failure, unspecified: Secondary | ICD-10-CM | POA: Diagnosis present

## 2023-09-09 DIAGNOSIS — Z66 Do not resuscitate: Secondary | ICD-10-CM | POA: Diagnosis present

## 2023-09-09 DIAGNOSIS — J9621 Acute and chronic respiratory failure with hypoxia: Secondary | ICD-10-CM | POA: Diagnosis present

## 2023-09-09 DIAGNOSIS — R9389 Abnormal findings on diagnostic imaging of other specified body structures: Secondary | ICD-10-CM | POA: Diagnosis not present

## 2023-09-09 DIAGNOSIS — I2510 Atherosclerotic heart disease of native coronary artery without angina pectoris: Secondary | ICD-10-CM | POA: Diagnosis present

## 2023-09-09 DIAGNOSIS — Z87891 Personal history of nicotine dependence: Secondary | ICD-10-CM | POA: Diagnosis not present

## 2023-09-09 DIAGNOSIS — Z9861 Coronary angioplasty status: Secondary | ICD-10-CM | POA: Diagnosis not present

## 2023-09-09 DIAGNOSIS — J189 Pneumonia, unspecified organism: Secondary | ICD-10-CM | POA: Diagnosis present

## 2023-09-09 DIAGNOSIS — R059 Cough, unspecified: Secondary | ICD-10-CM | POA: Diagnosis not present

## 2023-09-09 DIAGNOSIS — T380X5A Adverse effect of glucocorticoids and synthetic analogues, initial encounter: Secondary | ICD-10-CM | POA: Diagnosis present

## 2023-09-09 DIAGNOSIS — I5042 Chronic combined systolic (congestive) and diastolic (congestive) heart failure: Secondary | ICD-10-CM | POA: Diagnosis present

## 2023-09-09 DIAGNOSIS — J432 Centrilobular emphysema: Secondary | ICD-10-CM | POA: Diagnosis not present

## 2023-09-09 DIAGNOSIS — R918 Other nonspecific abnormal finding of lung field: Secondary | ICD-10-CM | POA: Diagnosis not present

## 2023-09-09 DIAGNOSIS — N1832 Chronic kidney disease, stage 3b: Secondary | ICD-10-CM | POA: Diagnosis present

## 2023-09-09 DIAGNOSIS — Z9981 Dependence on supplemental oxygen: Secondary | ICD-10-CM | POA: Diagnosis not present

## 2023-09-09 DIAGNOSIS — R0902 Hypoxemia: Secondary | ICD-10-CM | POA: Diagnosis not present

## 2023-09-09 DIAGNOSIS — J439 Emphysema, unspecified: Secondary | ICD-10-CM | POA: Diagnosis present

## 2023-09-09 DIAGNOSIS — D72829 Elevated white blood cell count, unspecified: Secondary | ICD-10-CM | POA: Diagnosis present

## 2023-09-09 DIAGNOSIS — E785 Hyperlipidemia, unspecified: Secondary | ICD-10-CM | POA: Diagnosis present

## 2023-09-09 DIAGNOSIS — J9811 Atelectasis: Secondary | ICD-10-CM | POA: Diagnosis not present

## 2023-09-09 LAB — RESP PANEL BY RT-PCR (RSV, FLU A&B, COVID)  RVPGX2
Influenza A by PCR: NEGATIVE
Influenza B by PCR: NEGATIVE
Resp Syncytial Virus by PCR: NEGATIVE
SARS Coronavirus 2 by RT PCR: NEGATIVE

## 2023-09-09 LAB — STREP PNEUMONIAE URINARY ANTIGEN: Strep Pneumo Urinary Antigen: NEGATIVE

## 2023-09-09 LAB — CBC
HCT: 45.7 % (ref 39.0–52.0)
Hemoglobin: 15.1 g/dL (ref 13.0–17.0)
MCH: 32.3 pg (ref 26.0–34.0)
MCHC: 33 g/dL (ref 30.0–36.0)
MCV: 97.6 fL (ref 80.0–100.0)
Platelets: 284 10*3/uL (ref 150–400)
RBC: 4.68 MIL/uL (ref 4.22–5.81)
RDW: 14.6 % (ref 11.5–15.5)
WBC: 17.3 10*3/uL — ABNORMAL HIGH (ref 4.0–10.5)
nRBC: 0 % (ref 0.0–0.2)

## 2023-09-09 LAB — GLUCOSE, CAPILLARY
Glucose-Capillary: 145 mg/dL — ABNORMAL HIGH (ref 70–99)
Glucose-Capillary: 112 mg/dL — ABNORMAL HIGH (ref 70–99)
Glucose-Capillary: 115 mg/dL — ABNORMAL HIGH (ref 70–99)
Glucose-Capillary: 107 mg/dL — ABNORMAL HIGH (ref 70–99)
Glucose-Capillary: 118 mg/dL — ABNORMAL HIGH (ref 70–99)

## 2023-09-09 LAB — TROPONIN I (HIGH SENSITIVITY): Troponin I (High Sensitivity): 13 ng/L (ref <18)

## 2023-09-09 LAB — PROTIME-INR
INR: 1.1 (ref 0.8–1.2)
Prothrombin Time: 14.5 s (ref 11.4–15.2)

## 2023-09-09 LAB — PHOSPHORUS: Phosphorus: 2.4 mg/dL — ABNORMAL LOW (ref 2.5–4.6)

## 2023-09-09 LAB — HEMOGLOBIN A1C
Hgb A1c MFr Bld: 6.9 % — ABNORMAL HIGH (ref 4.8–5.6)
Mean Plasma Glucose: 151.33 mg/dL

## 2023-09-09 LAB — LACTIC ACID, PLASMA
Lactic Acid, Venous: 2.3 mmol/L (ref 0.5–1.9)
Lactic Acid, Venous: 2.3 mmol/L (ref 0.5–1.9)

## 2023-09-09 LAB — MAGNESIUM: Magnesium: 2 mg/dL (ref 1.7–2.4)

## 2023-09-09 LAB — MRSA NEXT GEN BY PCR, NASAL: MRSA by PCR Next Gen: NOT DETECTED

## 2023-09-09 MED ORDER — LACTATED RINGERS IV SOLN: INTRAVENOUS | Status: AC

## 2023-09-09 MED ORDER — METOPROLOL SUCCINATE ER 100 MG PO TB24
100.0000 mg | ORAL_TABLET | Freq: Every day | ORAL | Status: DC
  Administered 2023-09-09 – 2023-09-21 (×13): 100 mg via ORAL
  Filled 2023-09-09 (×13): qty 1

## 2023-09-09 MED ORDER — LACTATED RINGERS IV SOLN: INTRAVENOUS | Status: DC

## 2023-09-09 MED ORDER — POTASSIUM & SODIUM PHOSPHATES 280-160-250 MG PO PACK
1.0000 | PACK | Freq: Two times a day (BID) | ORAL | Status: AC
  Administered 2023-09-09 (×2): 1 via ORAL
  Filled 2023-09-09 (×2): qty 1

## 2023-09-09 MED ORDER — MELATONIN 3 MG PO TABS
3.0000 mg | ORAL_TABLET | Freq: Once | ORAL | Status: AC
Start: 2023-09-09 — End: 2023-09-09
  Administered 2023-09-09: 3 mg via ORAL
  Filled 2023-09-09: qty 1

## 2023-09-09 MED ORDER — INSULIN ASPART 100 UNIT/ML IJ SOLN
0.0000 [IU] | Freq: Every day | INTRAMUSCULAR | Status: DC
  Administered 2023-09-11: 2 [IU] via SUBCUTANEOUS
  Administered 2023-09-14 – 2023-09-15 (×2): 4 [IU] via SUBCUTANEOUS
  Administered 2023-09-17 – 2023-09-19 (×3): 2 [IU] via SUBCUTANEOUS
  Administered 2023-09-21: 4 [IU] via SUBCUTANEOUS
  Administered 2023-09-22: 2 [IU] via SUBCUTANEOUS

## 2023-09-09 MED ORDER — ORAL CARE MOUTH RINSE: 15.0000 mL | OROMUCOSAL | Status: DC | PRN

## 2023-09-09 MED ORDER — POLYETHYLENE GLYCOL 3350 17 G PO PACK: 17.0000 g | PACK | Freq: Every day | ORAL | Status: DC | PRN

## 2023-09-09 MED ORDER — GABAPENTIN 100 MG PO CAPS
100.0000 mg | ORAL_CAPSULE | Freq: Two times a day (BID) | ORAL | Status: DC
  Administered 2023-09-09 – 2023-09-23 (×30): 100 mg via ORAL
  Filled 2023-09-09 (×30): qty 1

## 2023-09-09 MED ORDER — IPRATROPIUM-ALBUTEROL 0.5-2.5 (3) MG/3ML IN SOLN
3.0000 mL | RESPIRATORY_TRACT | Status: DC | PRN
  Administered 2023-09-20: 3 mL via RESPIRATORY_TRACT
  Filled 2023-09-09: qty 3

## 2023-09-09 MED ORDER — CHLORHEXIDINE GLUCONATE CLOTH 2 % EX PADS
6.0000 | MEDICATED_PAD | Freq: Every day | CUTANEOUS | Status: DC
  Administered 2023-09-09 – 2023-09-17 (×8): 6 via TOPICAL

## 2023-09-09 MED ORDER — SIMVASTATIN 20 MG PO TABS
20.0000 mg | ORAL_TABLET | Freq: Every day | ORAL | Status: DC
  Administered 2023-09-09 – 2023-09-23 (×15): 20 mg via ORAL
  Filled 2023-09-09 (×15): qty 1

## 2023-09-09 MED ORDER — SODIUM CHLORIDE 0.9 % IV SOLN
500.0000 mg | INTRAVENOUS | Status: AC
  Administered 2023-09-09 – 2023-09-11 (×2): 500 mg via INTRAVENOUS
  Filled 2023-09-09 (×2): qty 5

## 2023-09-09 MED ORDER — INSULIN ASPART 100 UNIT/ML IJ SOLN
0.0000 [IU] | Freq: Four times a day (QID) | INTRAMUSCULAR | Status: DC
  Administered 2023-09-09: 1 [IU] via SUBCUTANEOUS

## 2023-09-09 MED ORDER — TAMSULOSIN HCL 0.4 MG PO CAPS
0.4000 mg | ORAL_CAPSULE | Freq: Every day | ORAL | Status: DC
  Administered 2023-09-09 – 2023-09-23 (×15): 0.4 mg via ORAL
  Filled 2023-09-09 (×15): qty 1

## 2023-09-09 MED ORDER — INSULIN ASPART 100 UNIT/ML IJ SOLN
0.0000 [IU] | Freq: Three times a day (TID) | INTRAMUSCULAR | Status: DC
  Administered 2023-09-10 (×2): 1 [IU] via SUBCUTANEOUS
  Administered 2023-09-11: 3 [IU] via SUBCUTANEOUS
  Administered 2023-09-12: 1 [IU] via SUBCUTANEOUS
  Administered 2023-09-12: 5 [IU] via SUBCUTANEOUS
  Administered 2023-09-12 – 2023-09-13 (×2): 1 [IU] via SUBCUTANEOUS
  Administered 2023-09-13 (×2): 2 [IU] via SUBCUTANEOUS
  Administered 2023-09-14 (×2): 1 [IU] via SUBCUTANEOUS
  Administered 2023-09-15: 3 [IU] via SUBCUTANEOUS
  Administered 2023-09-15 – 2023-09-16 (×2): 1 [IU] via SUBCUTANEOUS
  Administered 2023-09-16 – 2023-09-21 (×10): 2 [IU] via SUBCUTANEOUS
  Administered 2023-09-21 – 2023-09-22 (×2): 3 [IU] via SUBCUTANEOUS
  Administered 2023-09-22 (×2): 7 [IU] via SUBCUTANEOUS
  Administered 2023-09-23: 5 [IU] via SUBCUTANEOUS
  Administered 2023-09-23: 1 [IU] via SUBCUTANEOUS

## 2023-09-09 MED ORDER — SACUBITRIL-VALSARTAN 24-26 MG PO TABS
1.0000 | ORAL_TABLET | Freq: Two times a day (BID) | ORAL | Status: DC
  Administered 2023-09-09 – 2023-09-23 (×29): 1 via ORAL
  Filled 2023-09-09 (×30): qty 1

## 2023-09-09 MED ORDER — INSULIN ASPART 100 UNIT/ML IJ SOLN: 0.0000 [IU] | Freq: Three times a day (TID) | INTRAMUSCULAR | Status: DC

## 2023-09-09 MED ORDER — SODIUM CHLORIDE 0.9 % IV SOLN
1.0000 g | INTRAVENOUS | Status: AC
  Administered 2023-09-09 – 2023-09-12 (×4): 1 g via INTRAVENOUS
  Filled 2023-09-09 (×5): qty 10

## 2023-09-09 MED ORDER — DOCUSATE SODIUM 100 MG PO CAPS: 100.0000 mg | ORAL_CAPSULE | Freq: Two times a day (BID) | ORAL | Status: DC | PRN

## 2023-09-09 MED ORDER — ESCITALOPRAM OXALATE 10 MG PO TABS
10.0000 mg | ORAL_TABLET | Freq: Every day | ORAL | Status: DC
  Administered 2023-09-09 – 2023-09-23 (×15): 10 mg via ORAL
  Filled 2023-09-09 (×15): qty 1

## 2023-09-09 MED ORDER — INSULIN ASPART 100 UNIT/ML IJ SOLN: 0.0000 [IU] | Freq: Every day | INTRAMUSCULAR | Status: DC

## 2023-09-09 NOTE — Progress Notes (Signed)
NAME:  Jonathan Lonsdale., MRN:  [id number], DOB:  1936/07/21, LOS: 0 ADMISSION DATE:  09/08/2023, CONSULTATION DATE: 09/09/2023 REFERRING MD: [place] - EDP CHIEF COMPLAINT:  Hemoptysis   History of Present Illness:  87 year old man with PMHx significant for CAD s/p PCI x2 (12/2021), PVD s/p L SFA stent (2009), HTN, emphysema on nocturnal home O2 @ 2 L [place], DM-2, dyslipidemia, CKD-3b, and CHF (June 2024 EF 40-45%, G1DD) who is here today with chest discomfort and hemoptysis. He developed dry cough initially today and chest discomfort today without f/c/r, wheezing, DOE, SOB, LL edema, rash, URTI symptoms, N/V/D, or abd pain. When EMS arrived, he coughed fresh blood, large amount. This is never happened before. O2 was increased from 2 to 4 L [place]. He lost 50 Ib over 2 yrs intentionally and has good appetite. Denied night sweats, trauma, and previous hx of malignancy. Smoked for 30 yrs, 1 ppd, but quit 1978. Denied alcohol and illicit drug use.   Pertinent Medical History:  CAD s/p PCI x2 (12/2021), PVD s/p L SFA stent (2009), HTN, emphysema on nocturnal home O2 @ 2 L [place], DM-2, dyslipidemia, CKD-3b, and CHF (June 2024 EF 40-45%, G1DD)   Significant Hospital Events: Including procedures, antibiotic start and stop dates in addition to other pertinent events   12/22 - Admitted for hemoptysis  Interim History / Subjective:  Admitted overnight for hemoptysis No further hemoptysis/bleeding Feels better overall, eager to go home (cares for his wife with dementia) Hgb stable Persistent increased O2 requirement, 8L [place] (2L baseline) CTA Chest with masslike opacity favoring PNA due to preserved airway/blood vessl borders Continue abx PT/OT eval, ambulatory O2  Objective:  Blood pressure 131/87, pulse 83, temperature 97.9 F (36.6 C), temperature source Oral, resp. rate (!) 27, height 6\' 2"  (1.88 m), weight 93.8 kg, SpO2 96%.        Intake/Output Summary (Last 24 hours) at 09/09/2023 0823 Last data  filed at 09/09/2023 0800 Gross per 24 hour  Intake 841.14 ml  Output 750 ml  Net 91.14 ml   Filed Weights   09/08/23 2147 09/09/23 0100  Weight: 95.3 kg 93.8 kg   Physical Examination: General: Acutely ill-appearing elderly man in NAD. Pleasant and conversant. HEENT: [place]/AT, anicteric sclera, PERRL, moist mucous membranes. Neuro: Awake, oriented x 4. Responds to verbal stimuli. Following commands consistently. Moves all 4 extremities spontaneously. Strength 5/5 in all 4 extremities.  CV: RRR, no m/g/r. PULM: Breathing even and unlabored on 8L [place]. Lung fields diminished on L, scattered rhonchi. GI: Soft, nontender, nondistended. Normoactive bowel sounds. Extremities: No LE edema noted. Skin: Warm/dry, no rashes.  Resolved Hospital Problem List:    Assessment & Plan:  Submassive hemoptysis due to LUL mass, on baby ASA and Plavix Emphysema on nocturnal home O2 @ 2 L [place]. Acute of chronic hypoxic resp failure due to hemoptysis CTA Chest 12/22 demonstrated rounded mass-like area of consolidation in the LUL, favoring PNA, recommending exclusion of malignancy. - Trend H&H - Transfuse for Hgb < 7.0 or hemodynamically significant bleeding - Hold ASA/Plavix in the setting of active bleeding - Consider TXA nebs if persistent hemoptysis (none current) - CAP coverage x 5-day course (ceftriaxone/azithromycin) - Follow Cx data - Supplemental O2 support, baseline home O2 2-3LNC - Wean O2 for sat 88-92% - Bronchodilators PRN - Pulmonary hygiene - Elevate HOB/aspiration precautions - Repeat CT Chest in 4-6 weeks for resolution/further characterization of consolidation - PT/OT consult, ambulation with O2  CAD s/p PCI x2 (12/2021) PVD  s/p L SFA stent (2009) CHF Echo 02/2023, EF 40-45%, G1DD  HTN HLD - Cardiac monitoring - Continue GDMT (Toprol-XL, Entresto) - Continue statin - Hold ASA/Plavix while active hemoptysis occurring  CKD stage 3b - Trend BMP - Replete electrolytes as  indicated - Monitor I&Os - Avoid nephrotoxic agents as able - Ensure adequate renal perfusion  T2DM - SSI - CBGs Q4H - Goal CBG 140-180  Depression - Continue Lexapro  Best Practice (right click and "Reselect all SmartList Selections" daily)   Diet/type: Regular consistency (see orders) DVT prophylaxis SCD Pressure ulcer(s): N/A GI prophylaxis: N/A Lines: N/A Foley:  N/A Code Status:  full code Last date of multidisciplinary goals of care discussion [patient]  Signature:   Tim Lair, PA-C [facility] 09/09/23 8:24 AM  Please see [email or web address] for pager details.  From 7A-7P if no response, please call [phone] After hours, please call ELink [phone]

## 2023-09-09 NOTE — H&P (Signed)
NAME:  Jonathan Suda., MRN:  [id number], DOB:  11/01/1935, LOS: 0 ADMISSION DATE:  09/08/2023, CONSULTATION DATE: 09/09/2023 REFERRING MD:  Glynn Octave, MD, CHIEF COMPLAINT:  hemoptysis   History of Present Illness:  A 87 y.o. male patient with CAD s/p PCI x2 (12/2021), PVD s/p L SFA stent (2009), HTN, emphysema on nocturnal home O2 @ 2 L [place], DM-2, dyslipidemia, CKD-3b, and CHF (June 2024 EF 40-45%, G1DD) who is here today with chest discomfort and hemoptysis. He developed dry cough initially today and chest discomfort today without f/c/r, wheezing, DOE, SOB, LL edema, rash, URTI symptoms, N/V/D, or abd pain. When EMS arrived, he coughed fresh blood, large amount. This is never happened before. O2 was increased from 2 to 4 L [place]. He lost 50 Ib over 2 yrs intentionally and has good appetite. Denied night sweats, trauma, and previous hx of malignancy. Smoked for 30 yrs, 1 ppd, but quit 1978. Denied alcohol and illicit drug use.   Pertinent  Medical History  CAD s/p PCI x2 (12/2021), PVD s/p L SFA stent (2009), HTN, emphysema on nocturnal home O2 @ 2 L [place], DM-2, dyslipidemia, CKD-3b, and CHF (June 2024 EF 40-45%, G1DD)   Significant Hospital Events: Including procedures, antibiotic start and stop dates in addition to other pertinent events     Interim History / Subjective:  CTA showed LUL infiltrate suspicious for mass. No PE Given LR, Rocephin, and Zithromax   Objective   Blood pressure 129/82, pulse 87, temperature 98.1 F (36.7 C), temperature source Oral, resp. rate 16, height 6\' 2"  (1.88 m), weight 95.3 kg, SpO2 92%.       No intake or output data in the 24 hours ending 09/09/23 0013 Filed Weights   09/08/23 2147  Weight: 95.3 kg    Examination: General: alert, oriented x4, and comfortable. On 4 L [place]. SpO2 96%  HENT: PERL, normal pharynx and oral mucosa. No LNE or thyromegaly. No JVD Lungs: symmetrical air entry bilaterally. No crackles or wheezing Cardiovascular: NL S1/S2.  No m/g/r Abdomen: no distension or tenderness Extremities: trace feet edema. Symmetrical  Neuro: nonfocal    Resolved Hospital Problem list     Assessment & Plan:  Submassive hemoptysis due to LUL mass, on baby ASA and Plavix -Admit to ICU for close observation -HOB elevation -Hold ASA and Plavix -Serial H/H -SCD -Rocephin and Zithromax for 5 days although I doubt PNA -Trop -LA -IVF -U.Ags -f/u Cx -I/S -Duoneb PRN  CAD s/p PCI x2 (12/2021), PVD s/p L SFA stent (2009) -continue other heart meds  HTN -continue BP meds  Emphysema on nocturnal home O2 @ 2 L [place]. Acute of chronic hypoxic resp failure due to hemoptysis  Wean O2 as tolerated  Duoneb PRN  DM-2 -HbA1c -ISS  Dyslipidemia  CKD-3b  CHF (June 2024, EF 40-45%, G1DD)  -continue HF regimen  Best Practice (right click and "Reselect all SmartList Selections" daily)   Diet/type: Regular consistency (see orders) DVT prophylaxis SCD Pressure ulcer(s): N/A GI prophylaxis: N/A Lines: N/A Foley:  N/A Code Status:  full code Last date of multidisciplinary goals of care discussion [patient]  Labs   CBC: Recent Labs  Lab 09/08/23 2214  WBC 16.1*  NEUTROABS 10.2*  HGB 15.8  HCT 47.4  MCV 97.1  PLT 309    Basic Metabolic Panel: Recent Labs  Lab 09/08/23 2214  NA 138  K 4.1  CL 106  CO2 21*  GLUCOSE 165*  BUN 28*  CREATININE 1.55*  CALCIUM 9.2   GFR: Estimated Creatinine Clearance: 40.5 mL/min (A) (by C-G formula based on SCr of 1.55 mg/dL (H)). Recent Labs  Lab 09/08/23 2214  WBC 16.1*    Liver Function Tests: Recent Labs  Lab 09/08/23 2214  AST 20  ALT 19  ALKPHOS 91  BILITOT 0.3  PROT 7.2  ALBUMIN 3.3*   No results for input(s): "LIPASE", "AMYLASE" in the last 168 hours. No results for input(s): "AMMONIA" in the last 168 hours.  ABG    Component Value Date/Time   PHART 7.447 01/11/2022 1630   PCO2ART 36.9 01/11/2022 1630   PO2ART 54 (L) 01/11/2022 1630   HCO3 25.5  01/11/2022 1630   TCO2 27 01/11/2022 1630   O2SAT 89 01/11/2022 1630     Coagulation Profile: No results for input(s): "INR", "PROTIME" in the last 168 hours.  Cardiac Enzymes: No results for input(s): "CKTOTAL", "CKMB", "CKMBINDEX", "TROPONINI" in the last 168 hours.  HbA1C: Hemoglobin A1C  Date/Time Value Ref Range Status  08/01/2023 11:08 AM 6.4 (A) 4.0 - 5.6 % Final  05/03/2023 11:55 AM 6.0 (A) 4.0 - 5.6 % Corrected   Hgb A1c MFr Bld  Date/Time Value Ref Range Status  10/30/2022 10:26 AM 7.4 (H) 4.6 - 6.5 % Final    Comment:    Glycemic Control Guidelines for People with Diabetes:Non Diabetic:  <6%Goal of Therapy: <7%Additional Action Suggested:  >8%   07/13/2022 08:06 AM 7.0 (H) 4.6 - 6.5 % Final    Comment:    Glycemic Control Guidelines for People with Diabetes:Non Diabetic:  <6%Goal of Therapy: <7%Additional Action Suggested:  >8%     CBG: No results for input(s): "GLUCAP" in the last 168 hours.  Review of Systems:   Review of Systems  Constitutional:  Positive for weight loss. Negative for chills, diaphoresis, fever and malaise/fatigue.  HENT:  Negative for congestion, nosebleeds, sinus pain and sore throat.   Respiratory:  Positive for cough, hemoptysis and sputum production. Negative for shortness of breath, wheezing and stridor.   Cardiovascular:  Positive for chest pain. Negative for palpitations, orthopnea, leg swelling and PND.  Gastrointestinal:  Negative for abdominal pain, blood in stool, diarrhea, heartburn, melena, nausea and vomiting.  Genitourinary:  Negative for dysuria, frequency and urgency.  Skin:  Negative for itching and rash.  Neurological:  Negative for dizziness, speech change, focal weakness, seizures, weakness and headaches.     Past Medical History:  He,  has a past medical history of CAD (coronary artery disease), Colon polyps, COPD (chronic obstructive pulmonary disease) (HCC), Diabetes mellitus, ED (erectile dysfunction), Ejection  fraction, Hyperlipidemia, Hypertension, Hypogonadism male, PVD (peripheral vascular disease) (HCC), and Shortness of breath.   Surgical History:   Past Surgical History:  Procedure Laterality Date   APPENDECTOMY     CORONARY STENT INTERVENTION N/A 01/12/2022   Procedure: CORONARY STENT INTERVENTION;  Surgeon: Swaziland, Peter M, MD;  Location: [facility] INVASIVE CV LAB;  Service: Cardiovascular;  Laterality: N/A;   LEFT HEART CATH AND CORONARY ANGIOGRAPHY N/A 03/13/2023   Procedure: LEFT HEART CATH AND CORONARY ANGIOGRAPHY;  Surgeon: [place], Peter M, MD;  Location: [facility] INVASIVE CV LAB;  Service: Cardiovascular;  Laterality: N/A;   RIGHT/LEFT HEART CATH AND CORONARY ANGIOGRAPHY N/A 01/11/2022   Procedure: RIGHT/LEFT HEART CATH AND CORONARY ANGIOGRAPHY;  Surgeon: Lyn Records, MD;  Location: [facility] INVASIVE CV LAB;  Service: Cardiovascular;  Laterality: N/A;   TONSILLECTOMY     Cataract surgery bilaterally 2023  Social History:   reports that he  quit smoking about 46 years ago. His smoking use included cigarettes. He started smoking about 76 years ago. He has a 60 pack-year smoking history. He has never used smokeless tobacco. He reports current alcohol use. He reports that he does not use drugs.   Family History:  His family history includes Diabetes in his maternal grandmother, paternal aunt, and another family member; Heart disease in his father; Hyperlipidemia in an other family member; Hypertension in his father; Prostate cancer in his brother; Stroke in his father. There is no history of Colon cancer.   Allergies No Known Allergies   Home Medications  Prior to Admission medications   Medication Sig Start Date End Date Taking? Authorizing Provider  albuterol (VENTOLIN HFA) 108 (90 Base) MCG/ACT inhaler USE 1 TO 2 INHALATIONS EVERY 6 HOURS AS NEEDED FOR WHEEZING OR SHORTNESS OF BREATH 09/28/22   Young, Clinton D, MD  Alirocumab (PRALUENT) 75 MG/ML SOAJ Inject 1 mL (75 mg total) into the skin every 14  (fourteen) days. 03/20/23   Jodelle Gross, NP  aspirin 81 MG tablet Take 81 mg by mouth daily.    [provider]  Blood Glucose Monitoring Suppl (FREESTYLE FREEDOM LITE) w/Device KIT Use Freestyle Freedom Lite meter to check blood sugar twice daily. DX:E11.65 11/17/21   Reather Littler, MD  clopidogrel (PLAVIX) 75 MG tablet Take 1 tablet (75 mg total) by mouth daily with breakfast. 06/19/22   Wanda Plump, MD  Dulaglutide (TRULICITY) 3 MG/0.5ML SOAJ INJECT 3 MG UNDER THE SKIN ONCE A WEEK 09/04/23   Thapa, [place], MD  empagliflozin (JARDIANCE) 25 MG TABS tablet Take 1 tablet (25 mg total) by mouth daily before breakfast. For diabetes E11.65 and E 11.29 08/01/23   Thapa, [place], MD  escitalopram (LEXAPRO) 10 MG tablet Take 1 tablet (10 mg total) by mouth daily. 08/02/23   Wanda Plump, MD  furosemide (LASIX) 40 MG tablet Take 1 tablet (40 mg total) by mouth daily. 04/10/23   Wanda Plump, MD  gabapentin (NEURONTIN) 100 MG capsule TAKE TWO CAPSULES BY MOUTH TWICE A DAY FOR NEUROPATHY 01/02/23   [provider]  glucose blood (FREESTYLE LITE) test strip USE AS INSTRUCTED 11/25/22   Reather Littler, MD  hydrOXYzine (ATARAX) 25 MG tablet Take 0.5-1 tablets (12.5-25 mg total) by mouth every 8 (eight) hours as needed for anxiety. 02/27/23   Wanda Plump, MD  Insulin Pen Needle (SURE COMFORT PEN NEEDLES) 31G X 6 MM MISC Use as directed 01/09/23   Reather Littler, MD  metFORMIN (GLUCOPHAGE) 500 MG tablet Take 1 tablet (500 mg total) by mouth 2 (two) times daily with a meal. 08/01/23   Thapa, [place], MD  metoprolol (TOPROL-XL) 200 MG 24 hr tablet Take 100 mg by mouth daily.    [provider]  Multiple Vitamin (MULTIVITAMIN WITH MINERALS) TABS tablet Take 1 tablet by mouth daily.    [provider]  ondansetron (ZOFRAN) 4 MG tablet Take 1 tablet (4 mg total) by mouth every 8 (eight) hours as needed for nausea or vomiting. 04/17/22   Wanda Plump, MD  OXYGEN Inhale 2 L/min into the lungs at bedtime.  PRN during day    [provider]  potassium chloride SA (KLOR-CON M) 20 MEQ tablet Take 1 tablet (20 mEq total) by mouth daily. 02/22/23   Zannie Cove, MD  sacubitril-valsartan (ENTRESTO) 24-26 MG Take 1 tablet by mouth 2 (two) times daily. 02/22/23   Zannie Cove, MD  simvastatin (ZOCOR) 20 MG  tablet Take 20 mg by mouth daily.    [provider]  tamsulosin (FLOMAX) 0.4 MG CAPS capsule Take 1 capsule (0.4 mg total) by mouth daily after supper. 12/03/22   Wanda Plump, MD  Tiotropium Bromide-Olodaterol (STIOLTO RESPIMAT) 2.5-2.5 MCG/ACT AERS Inhale 2 puffs into the lungs daily. 05/06/23   Waymon Budge, MD     Critical care time: 55 min

## 2023-09-09 NOTE — Progress Notes (Addendum)
eLink Physician-Brief Progress Note Patient Name: Jonathan [name]. DOB: 1935/10/03 MRN: [id number]   Date of Service  09/09/2023  HPI/Events of Note  87 y.o. male patient with CAD s/p PCI x2 (12/2021), PVD s/p L SFA stent (2009), HTN, ex smoker, emphysema on nocturnal home O2 @ 2 L [place], DM-2, dyslipidemia, CKD-3b, and CHF (June 2024 EF 40-45%, G1DD) who is here today with chest discomfort and hemoptysis. CTPA no PE. LUL suspicious mass. On rocephine/zithromax.  Now in ICU.  Camera: Obese. Alert and awake. On nasal o2. RR increased. 141/76, HR 98, sats 93%  Data: Cr 1.5 chronic. LFT normal LA 2.3 WBC 16 K. Hg 15.8 Plt 309 Covid/flu neg CT angio chest film reviewed EKG sinus, no acute changes.     eICU Interventions  INR/PT ordered BP control, avoid hypertension.  Prn ABG VTE SCD CBG goals < 180.  Watch for recurring hemoptysis.   .     Intervention Category Major Interventions: Respiratory failure - evaluation and management Evaluation Type: New Patient Evaluation  Ranee Gosselin 09/09/2023, 1:08 AM

## 2023-09-09 NOTE — Progress Notes (Addendum)
eLink Physician-Brief Progress Note Patient Name: Jonathan [name]. DOB: [birth year] MRN: [id number]   Date of Service  09/09/2023  HPI/Events of Note  Notified that patient has transfer orders placed but has been on HHFNC along with a NRB mask since early today. No distress on camera. O2 sat is 93. Does appear like he is a mouth breather with the HHFNC in place as well as the mask. RR is 16. BP is ok.   eICU Interventions  Wean o2 as tolerated. Retain in ICU for now with higher o2 needs.      Intervention Category Major Interventions: Respiratory failure - evaluation and management  Lucille Crichlow G [place] Sherr 09/09/2023, 7:21 PM  Addendum at 755 pm - Insomnia . 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 RN notes he was on a phone call talking to family giving them instructions on her care. RN also notes that day shift told her that he takes a while to recover from hypoxia when he gets up to go to the bathroom and has poor reserve, Does have chronic hypoxia at baseline. Will try some melatonin to see if he is able to sleep.

## 2023-09-09 NOTE — Plan of Care (Signed)
  Problem: Nutritional: Goal: Maintenance of adequate nutrition will improve Outcome: Progressing   Problem: Education: Goal: Knowledge of General Education information will improve Description: Including pain rating scale, medication(s)/side effects and non-pharmacologic comfort measures Outcome: Progressing   Problem: Nutrition: Goal: Adequate nutrition will be maintained Outcome: Progressing

## 2023-09-09 NOTE — ED Notes (Signed)
ED TO INPATIENT HANDOFF REPORT  ED Nurse Name and Phone #: Norlene Duel [phone]  S Name/Age/Gender Jonathan [name]. 87 y.o. male Room/Bed: 021C/021C  Code Status   Code Status: Full Code  Home/SNF/Other Home Patient oriented to: self, place, time, and situation Is this baseline? Yes   Triage Complete: Triage complete  Chief Complaint Hemoptysis [R04.2]  Triage Note Pt BIB ems with complaints of SOB. Pt states has had a cold for a few days and today started to cough up blood. Per EMS pt was not aware that he was coughing up blood until he  saw the blood on his mask. Lung sounds clear. Pt states chest pain only when coughing. Pt sats 87% on Room air. Pt put on 4l [place] by EMS. Pt has oxygen at home to use as needed.    Allergies No Known Allergies  Level of Care/Admitting Diagnosis ED Disposition     ED Disposition  Admit   Condition  --   Comment  Hospital Area: [facility] [[id number]]  Level of Care: ICU [6]  May admit patient to [facility] or [facility] if equivalent level of care is available:: No  Covid Evaluation: Asymptomatic - no recent exposure (last 10 days) testing not required  Diagnosis: Hemoptysis [741752]  Admitting Physician: Patrici Ranks [[id number]]  Attending Physician: Patrici Ranks [[id number]]  Certification:: I certify this patient will need inpatient services for at least 2 midnights  Expected Medical Readiness: 09/12/2023          B Medical/Surgery History Past Medical History:  Diagnosis Date   CAD (coronary artery disease)    Two RCA stents remotely / 3rd RCA stent 2006   Colon polyps    s/p several Cscopes.   COPD (chronic obstructive pulmonary disease) (HCC)    on O2, nocturnal   Diabetes mellitus    dx aprox 2009   ED (erectile dysfunction)    has a vacumm device   Ejection fraction    Hyperlipidemia    dx in 90s   Hypertension    dx in the 54   Hypogonadism male    PVD (peripheral vascular disease)  (HCC)    s/p stents at LE 2009, Dr Jacinto Halim   Shortness of breath    O2 Sat dropped to 82% walking on the treadmill, September, 2012   Past Surgical History:  Procedure Laterality Date   APPENDECTOMY     CORONARY STENT INTERVENTION N/A 01/12/2022   Procedure: CORONARY STENT INTERVENTION;  Surgeon: Swaziland, Peter M, MD;  Location: [facility] INVASIVE CV LAB;  Service: Cardiovascular;  Laterality: N/A;   LEFT HEART CATH AND CORONARY ANGIOGRAPHY N/A 03/13/2023   Procedure: LEFT HEART CATH AND CORONARY ANGIOGRAPHY;  Surgeon: Swaziland, Peter M, MD;  Location: [facility] INVASIVE CV LAB;  Service: Cardiovascular;  Laterality: N/A;   RIGHT/LEFT HEART CATH AND CORONARY ANGIOGRAPHY N/A 01/11/2022   Procedure: RIGHT/LEFT HEART CATH AND CORONARY ANGIOGRAPHY;  Surgeon: Lyn Records, MD;  Location: [facility] INVASIVE CV LAB;  Service: Cardiovascular;  Laterality: N/A;   TONSILLECTOMY       A IV Location/Drains/Wounds Patient Lines/Drains/Airways Status     Active Line/Drains/Airways     Name Placement date Placement time Site Days   Peripheral IV 09/08/23 20 G Right Antecubital 09/08/23  2212  Antecubital  1            Intake/Output Last 24 hours No intake or output data in the 24 hours ending 09/09/23 0014  Labs/Imaging  Results for orders placed or performed during the hospital encounter of 09/08/23 (from the past 48 hours)  Type and screen [facility]     Status: None   Collection Time: 09/08/23 10:13 PM  Result Value Ref Range   ABO/RH(D) O POS    Antibody Screen NEG    Sample Expiration      09/11/2023,2359 Performed at [facility], [address] [place], [place] [address]   CBC with Differential     Status: Abnormal   Collection Time: 09/08/23 10:14 PM  Result Value Ref Range   WBC 16.1 (H) 4.0 - 10.5 K/uL   RBC 4.88 4.22 - 5.81 MIL/uL   Hemoglobin 15.8 13.0 - 17.0 g/dL   HCT 88.4 16.6 - 06.3 %   MCV 97.1 80.0 - 100.0 fL   MCH 32.4 26.0 - 34.0 pg   MCHC 33.3 30.0 - 36.0  g/dL   RDW 01.6 01.0 - 93.2 %   Platelets 309 150 - 400 K/uL   nRBC 0.0 0.0 - 0.2 %   Neutrophils Relative % 63 %   Neutro Abs 10.2 (H) 1.7 - 7.7 K/uL   Lymphocytes Relative 26 %   Lymphs Abs 4.1 (H) 0.7 - 4.0 K/uL   Monocytes Relative 8 %   Monocytes Absolute 1.4 (H) 0.1 - 1.0 K/uL   Eosinophils Relative 2 %   Eosinophils Absolute 0.3 0.0 - 0.5 K/uL   Basophils Relative 1 %   Basophils Absolute 0.1 0.0 - 0.1 K/uL   Immature Granulocytes 0 %   Abs Immature Granulocytes 0.06 0.00 - 0.07 K/uL    Comment: Performed at [facility], [address], [place], [place] [address]  Comprehensive metabolic panel     Status: Abnormal   Collection Time: 09/08/23 10:14 PM  Result Value Ref Range   Sodium 138 135 - 145 mmol/L   Potassium 4.1 3.5 - 5.1 mmol/L   Chloride 106 98 - 111 mmol/L   CO2 21 (L) 22 - 32 mmol/L   Glucose, Bld 165 (H) 70 - 99 mg/dL    Comment: Glucose reference range applies only to samples taken after fasting for at least 8 hours.   BUN 28 (H) 8 - 23 mg/dL   Creatinine, Ser 2.20 (H) 0.61 - 1.24 mg/dL   Calcium 9.2 8.9 - 25.4 mg/dL   Total Protein 7.2 6.5 - 8.1 g/dL   Albumin 3.3 (L) 3.5 - 5.0 g/dL   AST 20 15 - 41 U/L   ALT 19 0 - 44 U/L   Alkaline Phosphatase 91 38 - 126 U/L   Total Bilirubin 0.3 <1.2 mg/dL   GFR, Estimated 44 (L) >60 mL/min    Comment: (NOTE) Calculated using the CKD-EPI Creatinine Equation (2021)    Anion gap 11 5 - 15    Comment: Performed at [facility], [address] [place], [place] [address]  Resp panel by RT-PCR (RSV, Flu A&B, Covid) Anterior Nasal Swab     Status: None   Collection Time: 09/08/23 11:06 PM   Specimen: Anterior Nasal Swab  Result Value Ref Range   SARS Coronavirus 2 by RT PCR NEGATIVE NEGATIVE   Influenza A by PCR NEGATIVE NEGATIVE   Influenza B by PCR NEGATIVE NEGATIVE    Comment: (NOTE) The Xpert Xpress SARS-CoV-2/FLU/RSV plus assay is intended as an aid in the diagnosis of influenza from  Nasopharyngeal swab specimens and should not be used as a sole basis for  treatment. Nasal washings and aspirates are unacceptable for Xpert Xpress SARS-CoV-2/FLU/RSV testing.  Fact Sheet for Patients: BloggerCourse.com  Fact Sheet for Healthcare Providers: SeriousBroker.it  This test is not yet approved or cleared by the [place] FDA and has been authorized for detection and/or diagnosis of SARS-CoV-2 by FDA under an Emergency Use Authorization (EUA). This EUA will remain in effect (meaning this test can be used) for the duration of the COVID-19 declaration under Section 564(b)(1) of the Act, 21 U.S.C. section 360bbb-3(b)(1), unless the authorization is terminated or revoked.     Resp Syncytial Virus by PCR NEGATIVE NEGATIVE    Comment: (NOTE) Fact Sheet for Patients: BloggerCourse.com  Fact Sheet for Healthcare Providers: SeriousBroker.it  This test is not yet approved or cleared by the [place] FDA and has been authorized for detection and/or diagnosis of SARS-CoV-2 by FDA under an Emergency Use Authorization (EUA). This EUA will remain in effect (meaning this test can be used) for the duration of the COVID-19 declaration under Section 564(b)(1) of the Act, 21 U.S.C. section 360bbb-3(b)(1), unless the authorization is terminated or revoked.  Performed at [facility], [address] [place], [place] [address]    DG Chest Portable 1 View Result Date: 09/08/2023 CLINICAL DATA:  Hemoptysis. Shortness of breath. Cold symptoms for a few days. EXAM: PORTABLE CHEST 1 VIEW COMPARISON:  02/21/2023 FINDINGS: Mild cardiac enlargement. Focal airspace infiltration in the left mid lung likely represents an area of pneumonia. This is new since prior study. Right lung is clear. No pleural effusions. No pneumothorax. Calcification of the aorta. Degenerative changes in the  spine. IMPRESSION: New focal area of airspace disease in the left mid lung, likely pneumonia. Follow-up to resolution is recommended to exclude underlying focal lesion. Electronically Signed   By: Burman Nieves M.D.   On: 09/08/2023 23:32   CT Angio Chest PE W and/or Wo Contrast Result Date: 09/08/2023 CLINICAL DATA:  Pulmonary embolism (PE) suspected, high prob. Hemoptysis. EXAM: CT ANGIOGRAPHY CHEST WITH CONTRAST TECHNIQUE: Multidetector CT imaging of the chest was performed using the standard protocol during bolus administration of intravenous contrast. Multiplanar CT image reconstructions and MIPs were obtained to evaluate the vascular anatomy. RADIATION DOSE REDUCTION: This exam was performed according to the departmental dose-optimization program which includes automated exposure control, adjustment of the mA and/or kV according to patient size and/or use of iterative reconstruction technique. CONTRAST:  75mL OMNIPAQUE IOHEXOL 350 MG/ML SOLN COMPARISON:  None Available. FINDINGS: Cardiovascular: No filling defects in the pulmonary arteries to suggest pulmonary emboli. Heart is normal size. Aorta is normal caliber. Extensive coronary artery and aortic atherosclerosis. Mediastinum/Nodes: No mediastinal, hilar, or axillary adenopathy. Trachea and thyroid unremarkable. Esophagus is fluid-filled. Lungs/Pleura: Rounded area of masslike consolidation in the left upper lobe. Favor pneumonia, but this warrants follow-up after treatment to exclude malignancy. No confluent opacity in the right lung. Interstitial thickening and ground-glass opacities in the lung bases bilaterally, right greater than left, favor scarring/fibrosis. No effusions. Upper Abdomen: Mild distention of the stomach with food debris/fluid/air. Musculoskeletal: Chest wall soft tissues are unremarkable. No acute bony abnormality. Review of the MIP images confirms the above findings. IMPRESSION: Rounded masslike area consolidation in the left  upper lobe, favor pneumonia. Followup CT is recommended in 3-4 weeks following trial of antibiotic therapy to ensure resolution and exclude underlying malignancy. Fluid-filled esophagus could be related to dysmotility or reflux. Mild distention of the stomach with gas and fluid/debris. Diffuse coronary artery disease. Aortic Atherosclerosis (ICD10-I70.0). Electronically Signed  By: Charlett Nose M.D.   On: 09/08/2023 23:22    Pending Labs Unresulted Labs (From admission, onward)     Start     Ordered   09/09/23 0500  Magnesium  Tomorrow morning,   R        09/09/23 0006   09/09/23 0500  Phosphorus  Tomorrow morning,   R        09/09/23 0006   09/09/23 0500  CBC  Tomorrow morning,   R        09/09/23 0009   09/08/23 2332  Lactic acid, plasma  (Lactic Acid)  Now then every 2 hours,   R (with STAT occurrences)      09/08/23 2331   09/08/23 2332  Blood culture (routine x 2)  BLOOD CULTURE X 2,   R (with STAT occurrences)      09/08/23 2331            Vitals/Pain Today's Vitals   09/08/23 2147 09/08/23 2223 09/08/23 2255 09/08/23 2300  BP: (!) 161/93 123/76 (!) 150/70 129/82  Pulse: 82 88 89 87  Resp: (!) 22 20 16 16   Temp: 98.1 F (36.7 C)     TempSrc: Oral     SpO2: 90% 90% (!) 89% 92%  Weight: 95.3 kg     Height: 6\' 2"  (1.88 m)     PainSc: 5        Isolation Precautions No active isolations  Medications Medications  cefTRIAXone (ROCEPHIN) 1 g in sodium chloride 0.9 % 100 mL IVPB (has no administration in time range)  azithromycin (ZITHROMAX) 500 mg in sodium chloride 0.9 % 250 mL IVPB (has no administration in time range)  docusate sodium (COLACE) capsule 100 mg (has no administration in time range)  polyethylene glycol (MIRALAX / GLYCOLAX) packet 17 g (has no administration in time range)  escitalopram (LEXAPRO) tablet 10 mg (has no administration in time range)  gabapentin (NEURONTIN) capsule 100 mg (has no administration in time range)  metoprolol succinate  (TOPROL-XL) 24 hr tablet 100 mg (has no administration in time range)  sacubitril-valsartan (ENTRESTO) 24-26 mg per tablet (has no administration in time range)  simvastatin (ZOCOR) tablet 20 mg (has no administration in time range)  tamsulosin (FLOMAX) capsule 0.4 mg (has no administration in time range)  ipratropium-albuterol (DUONEB) 0.5-2.5 (3) MG/3ML nebulizer solution 3 mL (has no administration in time range)  lactated ringers infusion (has no administration in time range)  iohexol (OMNIPAQUE) 350 MG/ML injection 75 mL (75 mLs Intravenous Contrast Given 09/08/23 2307)    Mobility walks     Focused Assessments Pulmonary Assessment Handoff:  Lung sounds: Bilateral Breath Sounds: Clear O2 Device: Nasal Cannula O2 Flow Rate (L/min): 4 L/min    R Recommendations: See Admitting Provider Note  Report given to:   Additional Notes: 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 Has been sick for a few days but started coughing up blood today (didn't notice until EMS pointed it out).

## 2023-09-09 NOTE — Plan of Care (Signed)
  Problem: Coping: Goal: Ability to adjust to condition or change in health will improve Outcome: Progressing   

## 2023-09-09 NOTE — Progress Notes (Signed)
Date and time results received: 09/09/23 0110 (use smartphrase ".now" to insert current time)  Test: Lactic Acid Critical Value: 2.3  Name of Provider Notified: Rebbeca Paul, Elink RN (to notify [facility] provider  Orders Received? Or Actions Taken?:  Elink RN to notify provider, will continue to follow patient's care plan

## 2023-09-09 NOTE — Progress Notes (Signed)
Pt had a coughing episode and produced about 4 ounces of bloody oral secretions.  Dr. Tonia Brooms made aware.  About 20 minutes later, pt produced another 1-2 ounces.  O2 sats stable and pt A&O x4.

## 2023-09-10 ENCOUNTER — Inpatient Hospital Stay (HOSPITAL_COMMUNITY): Payer: Medicare Other

## 2023-09-10 LAB — MAGNESIUM: Magnesium: 2 mg/dL (ref 1.7–2.4)

## 2023-09-10 LAB — PROTIME-INR
INR: 1.1 (ref 0.8–1.2)
Prothrombin Time: 14.2 s (ref 11.4–15.2)

## 2023-09-10 LAB — POCT I-STAT 7, (LYTES, BLD GAS, ICA,H+H)
Acid-Base Excess: 0 mmol/L (ref 0.0–2.0)
Bicarbonate: 24.2 mmol/L (ref 20.0–28.0)
Calcium, Ion: 1.26 mmol/L (ref 1.15–1.40)
HCT: 39 % (ref 39.0–52.0)
Hemoglobin: 13.3 g/dL (ref 13.0–17.0)
O2 Saturation: 92 %
Patient temperature: 97.7
Potassium: 3.8 mmol/L (ref 3.5–5.1)
Sodium: 141 mmol/L (ref 135–145)
TCO2: 25 mmol/L (ref 22–32)
pCO2 arterial: 36.5 mm[Hg] (ref 32–48)
pH, Arterial: 7.427 (ref 7.35–7.45)
pO2, Arterial: 61 mm[Hg] — ABNORMAL LOW (ref 83–108)

## 2023-09-10 LAB — CBC
HCT: 40.2 % (ref 39.0–52.0)
Hemoglobin: 13.3 g/dL (ref 13.0–17.0)
MCH: 32 pg (ref 26.0–34.0)
MCHC: 33.1 g/dL (ref 30.0–36.0)
MCV: 96.9 fL (ref 80.0–100.0)
Platelets: 257 10*3/uL (ref 150–400)
RBC: 4.15 MIL/uL — ABNORMAL LOW (ref 4.22–5.81)
RDW: 14.6 % (ref 11.5–15.5)
WBC: 10.3 10*3/uL (ref 4.0–10.5)
nRBC: 0 % (ref 0.0–0.2)

## 2023-09-10 LAB — GLUCOSE, CAPILLARY
Glucose-Capillary: 132 mg/dL — ABNORMAL HIGH (ref 70–99)
Glucose-Capillary: 139 mg/dL — ABNORMAL HIGH (ref 70–99)
Glucose-Capillary: 141 mg/dL — ABNORMAL HIGH (ref 70–99)
Glucose-Capillary: 125 mg/dL — ABNORMAL HIGH (ref 70–99)
Glucose-Capillary: 132 mg/dL — ABNORMAL HIGH (ref 70–99)

## 2023-09-10 LAB — BASIC METABOLIC PANEL
Anion gap: 9 (ref 5–15)
BUN: 32 mg/dL — ABNORMAL HIGH (ref 8–23)
CO2: 21 mmol/L — ABNORMAL LOW (ref 22–32)
Calcium: 8.6 mg/dL — ABNORMAL LOW (ref 8.9–10.3)
Chloride: 108 mmol/L (ref 98–111)
Creatinine, Ser: 1.2 mg/dL (ref 0.61–1.24)
GFR, Estimated: 59 mL/min — ABNORMAL LOW (ref >60)
Glucose, Bld: 146 mg/dL — ABNORMAL HIGH (ref 70–99)
Potassium: 4.1 mmol/L (ref 3.5–5.1)
Sodium: 138 mmol/L (ref 135–145)

## 2023-09-10 LAB — PHOSPHORUS: Phosphorus: 3.4 mg/dL (ref 2.5–4.6)

## 2023-09-10 MED ORDER — PREDNISONE 20 MG PO TABS
40.0000 mg | ORAL_TABLET | Freq: Every day | ORAL | Status: AC
  Administered 2023-09-11 – 2023-09-15 (×5): 40 mg via ORAL
  Filled 2023-09-10 (×5): qty 2

## 2023-09-10 MED ORDER — MELATONIN 3 MG PO TABS
3.0000 mg | ORAL_TABLET | Freq: Once | ORAL | Status: AC
  Administered 2023-09-10: 3 mg via ORAL
  Filled 2023-09-10: qty 1

## 2023-09-10 NOTE — Plan of Care (Signed)
  Problem: Coping: Goal: Ability to adjust to condition or change in health will improve Outcome: Progressing   Problem: Clinical Measurements: Goal: Ability to maintain clinical measurements within normal limits will improve Outcome: Progressing

## 2023-09-10 NOTE — Progress Notes (Signed)
eLink Physician-Brief Progress Note Patient Name: Jonathan [name]. DOB: May 26, 1936 MRN: [id number]   Date of Service  09/10/2023  HPI/Events of Note  Insomnia  eICU Interventions  Melatonin ordered     Intervention Category Minor Interventions: Routine modifications to care plan (e.g. PRN medications for pain, fever)  Ranee Gosselin 09/10/2023, 7:28 PM

## 2023-09-10 NOTE — Progress Notes (Signed)
   NAME:  Michaelvincent Wille., MRN:  [id number], DOB:  1935/11/11, LOS: 1 ADMISSION DATE:  09/08/2023, CONSULTATION DATE: 09/09/2023 REFERRING MD: [facility] - EDP CHIEF COMPLAINT:  Hemoptysis   History of Present Illness:  87 year old man with PMHx significant for CAD s/p PCI x2 (12/2021), PVD s/p L SFA stent (2009), HTN, emphysema on nocturnal home O2 @ 2 L [place], DM-2, dyslipidemia, CKD-3b, and CHF (June 2024 EF 40-45%, G1DD) who is here today with chest discomfort and hemoptysis. He developed dry cough initially today and chest discomfort today without f/c/r, wheezing, DOE, SOB, LL edema, rash, URTI symptoms, N/V/D, or abd pain. When EMS arrived, he coughed fresh blood, large amount. This is never happened before. O2 was increased from 2 to 4 L [place]. He lost 50 Ib over 2 yrs intentionally and has good appetite. Denied night sweats, trauma, and previous hx of malignancy. Smoked for 30 yrs, 1 ppd, but quit 1978. Denied alcohol and illicit drug use.   Pertinent Medical History:  CAD s/p PCI x2 (12/2021), PVD s/p L SFA stent (2009), HTN, emphysema on nocturnal home O2 @ 2 L [place], DM-2, dyslipidemia, CKD-3b, and CHF (June 2024 EF 40-45%, G1DD)   Significant Hospital Events: Including procedures, antibiotic start and stop dates in addition to other pertinent events   12/22 - Admitted for hemoptysis  Interim History / Subjective:   Increased o2 requirements  CXR worsening   Objective:  Blood pressure 115/75, pulse 72, temperature 97.7 F (36.5 C), temperature source Oral, resp. rate 19, height 6\' 2"  (1.88 m), weight 92.5 kg, SpO2 94%.    FiO2 (%):  [95 %-100 %] 100 %   Intake/Output Summary (Last 24 hours) at 09/10/2023 0926 Last data filed at 09/10/2023 0800 Gross per 24 hour  Intake 1279.93 ml  Output 2400 ml  Net -1120.07 ml   Filed Weights   09/08/23 2147 09/09/23 0100 09/10/23 0500  Weight: 95.3 kg 93.8 kg 92.5 kg   Physical Examination: General: elderly male, resting in bed  HEENT: NCAT  tracking  Neuro: alert following commands   CV: RRR no mrg  PULM: severely diminshed breath sounds on the left  GI: soft nt nd  Extremities: no edema  Skin: no rash   Resolved Hospital Problem List:    Assessment & Plan:   Submassive hemoptysis due to LUL mass, on baby ASA and Plavix Emphysema on nocturnal home O2 @ 2 L . Acute of chronic hypoxic resp failure due to hemoptysis P: Worsening right sided PNA  Increased to HHFNC Avoid BIPAP  Remains on ceftriaxone and azithro  Added prednisone 40mg  x 5 days for severe cap  PT OT and needs mobility   CAD s/p PCI x2 (12/2021) PVD s/p L SFA stent (2009) CHF Echo 02/2023, EF 40-45%, G1DD  HTN HLD P: Tele monitoring  Entresto and toprol  Holding asa and plavix with bleeding   CKD stage 3b - UOP and BMP Follow closely   T2DM - SSI - CBGs Q4H - Goal CBG 140-180  Depression - Continue Lexapro  GOC:  Wants to be a DNR DNI   Best Practice (right click and "Reselect all SmartList Selections" daily)   Diet/type: Regular consistency (see orders) DVT prophylaxis SCD Pressure ulcer(s): N/A GI prophylaxis: N/A Lines: N/A Foley:  N/A Code Status:  full code Last date of multidisciplinary goals of care discussion [patient]  Signature:    Josephine Igo, DO [facility] 09/10/23 9:26 AM

## 2023-09-10 NOTE — Evaluation (Addendum)
Occupational Therapy Evaluation Patient Details Name: Jonathan [name]. MRN: [id number] DOB: 1936/07/07 Today's Date: 09/10/2023   History of Present Illness 87 y.o. male [facility] to [facility] hospital on 09/08/2023 with chest pain and hemoptysis. CTA with LUL infiltrate suspicious for mass. PMH includes CAD, PVD, HTN, emphysema, DMII, HLD, CKD III, CHF.   Clinical Impression   Pt was independent, driving and the caregiver for his wife prior to admission. 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 Pt presents with decreased activity tolerance with dependence on HHFNC. He requires set up to supervision for ADLs and step pivot transfers without AD. Pt is highly motivated to maintain his strength and activity level, "I could walk all around this place if they would let me." Pt reports having O2 at home and using it to recover from heavy work. He recently traveled to [place] and stated he did not need to take his O2 with him. Recommending HHOT, depending on progress.       If plan is discharge home, recommend the following: Assistance with cooking/housework;Assist for transportation;Help with stairs or ramp for entrance    Functional Status Assessment  Patient has had a recent decline in their functional status and demonstrates the ability to make significant improvements in function in a reasonable and predictable amount of time.  Equipment Recommendations  Tub/shower seat    Recommendations for Other Services       Precautions / Restrictions Precautions Precautions: Fall Precaution Comments: HHFNC Restrictions Weight Bearing Restrictions Per Provider Order: No      Mobility Bed Mobility Overal bed mobility: Independent                  Transfers Overall transfer level: Needs assistance Equipment used: None Transfers: Sit to/from Stand, Bed to chair/wheelchair/BSC Sit to Stand: Supervision     Step pivot transfers: Supervision     General transfer comment:  supervision for lines      Balance Overall balance assessment: Needs assistance   Sitting balance-Leahy Scale: Good     Standing balance support: No upper extremity supported, During functional activity Standing balance-Leahy Scale: Good                             ADL either performed or assessed with clinical judgement   ADL Overall ADL's : Needs assistance/impaired Eating/Feeding: Independent;Bed level   Grooming: Set up;Sitting   Upper Body Bathing: Set up;Sitting   Lower Body Bathing: Supervison/ safety;Sit to/from stand   Upper Body Dressing : Set up;Sitting   Lower Body Dressing: Set up;Sitting/lateral leans Lower Body Dressing Details (indicate cue type and reason): can perform figure 4 to don socks Toilet Transfer: Supervision/safety;Stand-pivot;BSC/3in1   Toileting- Clothing Manipulation and Hygiene: Set up;Adhering to back precautions               Vision Ability to See in Adequate Light: 0 Adequate Patient Visual Report: No change from baseline       Perception         Praxis         Pertinent Vitals/Pain Pain Assessment Pain Assessment: No/denies pain     Extremity/Trunk Assessment Upper Extremity Assessment Upper Extremity Assessment: Overall WFL for tasks assessed   Lower Extremity Assessment Lower Extremity Assessment: Defer to PT evaluation   Cervical / Trunk Assessment Cervical / Trunk Assessment: Normal   Communication Communication Communication: No apparent difficulties   Cognition Arousal: Alert Behavior During Therapy:  WFL for tasks assessed/performed Overall Cognitive Status: Within Functional Limits for tasks assessed                                       General Comments  VSS on HHFNC (appears to be 50L) with NRB (20L).    Exercises     Shoulder Instructions      Home Living Family/patient expects to be discharged to:: Private residence Living Arrangements: Spouse/significant  other;Other (Comment) (wife has dementia) Available Help at Discharge: Family;Available 24 hours/day Type of Home: House Home Access: Stairs to enter [employer] of Steps: 3 Entrance Stairs-Rails: Can reach both Home Layout: Two level;Able to live on main level with bedroom/bathroom     Bathroom Shower/Tub: Producer, television/film/video: Standard     Home Equipment: Cane - single point;Rollator (4 wheels);Other (comment) (O2)          Prior Functioning/Environment Prior Level of Function : Independent/Modified Independent;Driving               ADLs Comments: used O2 to help recover after heavy work        OT Problem List: Decreased activity tolerance      OT Treatment/Interventions: Energy conservation;Patient/family education;Self-care/ADL training    OT Goals(Current goals can be found in the care plan section) Acute Rehab OT Goals OT Goal Formulation: With patient Time For Goal Achievement: 09/24/23 Potential to Achieve Goals: Good ADL Goals Additional ADL Goal #1: Pt will complete basic ADLs sit<>stand at sink with set up. Additional ADL Goal #2: Pt will employ energy conservation strategies and breathing techniques during ADLs and mobility as instructed.  OT Frequency: Min 1X/week    Co-evaluation              AM-PAC OT "6 Clicks" Daily Activity     Outcome Measure Help from another person eating meals?: None Help from another person taking care of personal grooming?: A Little Help from another person toileting, which includes using toliet, bedpan, or urinal?: A Little Help from another person bathing (including washing, rinsing, drying)?: A Little Help from another person to put on and taking off regular upper body clothing?: A Little Help from another person to put on and taking off regular lower body clothing?: A Little 6 Click Score: 19   End of Session Equipment Utilized During Treatment: Oxygen (HHFNC)  Activity Tolerance:  Patient tolerated treatment well Patient left: in bed;with call bell/phone within reach  OT Visit Diagnosis: Other (comment) (decreased activity tolerance)                Time: 8657-8469 OT Time Calculation (min): 22 min Charges:  OT General Charges $OT Visit: 1 Visit OT Evaluation $OT Eval Low Complexity: 1 Low [name], OTR/L Acute Rehabilitation Services Office: [phone]   Evern Bio 09/10/2023, 3:49 PM

## 2023-09-10 NOTE — Plan of Care (Addendum)
  Interdisciplinary Goals of Care Family Meeting   Date carried out: 09/10/2023  Location of the meeting: Bedside  Member's involved: Physician and Bedside Registered Nurse  Durable Power of Attorney or acting medical decision maker: patient     Discussion: We discussed goals of care for Jonathan [name]. Marland Kitchen Discussed increasing the patients O2 requirements. He understands that he would not want to be placed on a ventilator or do chest compressions if his heart stopped.   Code status:   Code Status: Limited: Do not attempt resuscitation (DNR) -DNR-LIMITED -Do Not Intubate/DNI    Disposition: Continue current acute care  Time spent for the meeting: 15 mins    Jonathan Bo Ezio Wieck, DO  09/10/2023, 11:12 AM

## 2023-09-10 NOTE — Evaluation (Signed)
Physical Therapy Evaluation Patient Details Name: Jonathan [name]. MRN: [id number] DOB: 07-23-1936 Today's Date: 09/10/2023  History of Present Illness  87 y.o. male presents to [facility] hospital on 09/08/2023 with chest pain and hemoptysis. CTA with LUL infiltrate suspicious for mass. PMH includes CAD, PVD, HTN, emphysema, DMII, HLD, CKD III, CHF.  Clinical Impression  Pt presents to PT with deficits in cardiopulmonary function, endurance, balance, strength, gait. Pt is able to ambulate for short household distances, demonstrating mild instability with backward walking. PT encourages frequent mobilization in an effort to improve activity tolerance and pulmonary function. PT will continue to follow.        If plan is discharge home, recommend the following: A little help with walking and/or transfers;A little help with bathing/dressing/bathroom;Assistance with cooking/housework;Assist for transportation;Help with stairs or ramp for entrance   Can travel by private vehicle        Equipment Recommendations  (TBD pending progress)  Recommendations for Other Services       Functional Status Assessment       Precautions / Restrictions Precautions Precautions: Fall Precaution Comments: HHFNC Restrictions Weight Bearing Restrictions Per Provider Order: No      Mobility  Bed Mobility Overal bed mobility: Independent                  Transfers Overall transfer level: Needs assistance Equipment used: None Transfers: Sit to/from Stand Sit to Stand: Supervision                Ambulation/Gait Ambulation/Gait assistance: Min assist Gait Distance (Feet): 50 Feet (additional trial of 30'. Pt walking multiple times forward and backward at bedside, limited by HHFNC. minA for backward walking) Assistive device: None Gait Pattern/deviations: Step-through pattern Gait velocity: functional Gait velocity interpretation: 1.31 - 2.62 ft/sec, indicative of limited community  ambulator   General Gait Details: slowed step-through gait, posterior lean with backward stepping  Stairs            Wheelchair Mobility     Tilt Bed    Modified Rankin (Stroke Patients Only)       Balance Overall balance assessment: Needs assistance Sitting-balance support: No upper extremity supported, Feet supported Sitting balance-Leahy Scale: Good     Standing balance support: No upper extremity supported, During functional activity Standing balance-Leahy Scale: Good                               Pertinent Vitals/Pain Pain Assessment Pain Assessment: No/denies pain    Home Living Family/patient expects to be discharged to:: Private residence Living Arrangements: Spouse/significant other Available Help at Discharge: Family;Available 24 hours/day Type of Home: House Home Access: Stairs to enter Entrance Stairs-Rails: Can reach both Entrance Stairs-Number of Steps: 3   Home Layout: Two level;Able to live on main level with bedroom/bathroom Home Equipment: Gilmer Mor - single point;Rollator (4 wheels)      Prior Function Prior Level of Function : Independent/Modified Independent;Driving                     Extremity/Trunk Assessment   Upper Extremity Assessment Upper Extremity Assessment: Overall WFL for tasks assessed    Lower Extremity Assessment Lower Extremity Assessment: Generalized weakness    Cervical / Trunk Assessment Cervical / Trunk Assessment: Normal  Communication   Communication Communication: No apparent difficulties  Cognition Arousal: Alert Behavior During Therapy: WFL for tasks assessed/performed Overall Cognitive Status: Within Functional Limits for tasks  assessed                                          General Comments General comments (skin integrity, edema, etc.): VSS on HHFNC (appears to be 50L) with NRB (20L).    Exercises     Assessment/Plan    PT Assessment Patient needs  continued PT services  PT Problem List Decreased activity tolerance;Decreased balance;Decreased mobility;Cardiopulmonary status limiting activity       PT Treatment Interventions      PT Goals (Current goals can be found in the Care Plan section)  Acute Rehab PT Goals Patient Stated Goal: to return to independence PT Goal Formulation: With patient Time For Goal Achievement: 09/24/23 Potential to Achieve Goals: Fair Additional Goals Additional Goal #1: Pt will score >19/24 on the DGI to indicate a reduced risk for falls Additional Goal #2: Pt will report 1/4 DOE or less when ambulating to demonstrate improved activity tolerance    Frequency Min 1X/week     Co-evaluation               AM-PAC PT "6 Clicks" Mobility  Outcome Measure Help needed turning from your back to your side while in a flat bed without using bedrails?: None Help needed moving from lying on your back to sitting on the side of a flat bed without using bedrails?: None Help needed moving to and from a bed to a chair (including a wheelchair)?: A Little Help needed standing up from a chair using your arms (e.g., wheelchair or bedside chair)?: A Little Help needed to walk in hospital room?: A Little Help needed climbing 3-5 steps with a railing? : Total 6 Click Score: 18    End of Session Equipment Utilized During Treatment: Oxygen Activity Tolerance: Patient tolerated treatment well Patient left: in bed;with call bell/phone within reach Nurse Communication: Mobility status PT Visit Diagnosis: Other abnormalities of gait and mobility (R26.89)    Time: 4782-9562 PT Time Calculation (min) (ACUTE ONLY): 31 min   Charges:   PT Evaluation $PT Eval Moderate Complexity: 1 Mod   PT General Charges $$ ACUTE PT VISIT: 1 Visit         Arlyss Gandy, PT, DPT Acute Rehabilitation Office [phone]   Arlyss Gandy 09/10/2023, 2:57 PM

## 2023-09-10 NOTE — Progress Notes (Signed)
ABG obtained x1 attempt. Results given to Dr. Frederick Peers, no new orders received at this time. RT will continue to be available as needed.     40L 100% FiO2 :   Latest Reference Range & Units 09/10/23 08:29  Sample type  ARTERIAL  pH, Arterial 7.35 - 7.45  7.427  pCO2 arterial 32 - 48 mmHg 36.5  pO2, Arterial 83 - 108 mmHg 61 (L)  TCO2 22 - 32 mmol/L 25  Acid-Base Excess 0.0 - 2.0 mmol/L 0.0  Bicarbonate 20.0 - 28.0 mmol/L 24.2  O2 Saturation % 92  Patient temperature  97.7 F  Collection site  RADIAL, ALLEN'S TEST ACCEPTABLE

## 2023-09-11 DIAGNOSIS — Z515 Encounter for palliative care: Secondary | ICD-10-CM | POA: Diagnosis not present

## 2023-09-11 DIAGNOSIS — J189 Pneumonia, unspecified organism: Secondary | ICD-10-CM | POA: Diagnosis not present

## 2023-09-11 DIAGNOSIS — R042 Hemoptysis: Secondary | ICD-10-CM | POA: Diagnosis not present

## 2023-09-11 LAB — GLUCOSE, CAPILLARY
Glucose-Capillary: 115 mg/dL — ABNORMAL HIGH (ref 70–99)
Glucose-Capillary: 139 mg/dL — ABNORMAL HIGH (ref 70–99)
Glucose-Capillary: 238 mg/dL — ABNORMAL HIGH (ref 70–99)
Glucose-Capillary: 216 mg/dL — ABNORMAL HIGH (ref 70–99)
Glucose-Capillary: 219 mg/dL — ABNORMAL HIGH (ref 70–99)

## 2023-09-11 NOTE — Progress Notes (Signed)
   NAME:  Jonathan Southwell., MRN:  [id number], DOB:  1935-11-07, LOS: 2 ADMISSION DATE:  09/08/2023, CONSULTATION DATE: 09/09/2023 REFERRING MD: Rancour - EDP CHIEF COMPLAINT:  Hemoptysis   History of Present Illness:  87 year old man with PMHx significant for CAD s/p PCI x2 (12/2021), PVD s/p L SFA stent (2009), HTN, emphysema on nocturnal home O2 @ 2 L [place], DM-2, dyslipidemia, CKD-3b, and CHF (June 2024 EF 40-45%, G1DD) who is here today with chest discomfort and hemoptysis. He developed dry cough initially today and chest discomfort today without f/c/r, wheezing, DOE, SOB, LL edema, rash, URTI symptoms, N/V/D, or abd pain. When EMS arrived, he coughed fresh blood, large amount. This is never happened before. O2 was increased from 2 to 4 L [place]. He lost 50 Ib over 2 yrs intentionally and has good appetite. Denied night sweats, trauma, and previous hx of malignancy. Smoked for 30 yrs, 1 ppd, but quit 1978. Denied alcohol and illicit drug use.   Pertinent Medical History:  CAD s/p PCI x2 (12/2021), PVD s/p L SFA stent (2009), HTN, emphysema on nocturnal home O2 @ 2 L [place], DM-2, dyslipidemia, CKD-3b, and CHF (June 2024 EF 40-45%, G1DD)   Significant Hospital Events: Including procedures, antibiotic start and stop dates in addition to other pertinent events   12/22 - Admitted for hemoptysis  Interim History / Subjective:  Oxygen requirement stable on heated high flow.  Objective:  Blood pressure (!) 108/53, pulse 74, temperature 97.9 F (36.6 C), temperature source Axillary, resp. rate 17, height 6\' 2"  (1.88 m), weight 92.5 kg, SpO2 97%.    FiO2 (%):  [100 %] 100 %   Intake/Output Summary (Last 24 hours) at 09/11/2023 0955 Last data filed at 09/11/2023 0800 Gross per 24 hour  Intake 709.7 ml  Output 825 ml  Net -115.3 ml   Filed Weights   09/08/23 2147 09/09/23 0100 09/10/23 0500  Weight: 95.3 kg 93.8 kg 92.5 kg   Physical Examination: General: Elderly male resting in bed no acute  distress HEENT: N NCAT tracking appropriately Neuro: Soft, nontender nondistended CV: Regular rate rhythm S1-S2 PULM: Diminished breath sounds in the left chest compared to the right GI: Soft nontender nondistended Extremities: No edema Skin: No rash  Resolved Hospital Problem List:    Assessment & Plan:   Submassive hemoptysis due to LUL mass, on baby ASA and Plavix Emphysema on nocturnal home O2 @ 2 L [place]. Acute of chronic hypoxic resp failure due to hemoptysis P: Able to wean on heated high flow nasal cannula Continue ceftriaxone azithromycin Prednisone 40 mg x 5 days Continue PT OT and mobility Added flutter and I-S encouraged him to use these.  CAD s/p PCI x2 (12/2021) PVD s/p L SFA stent (2009) CHF Echo 02/2023, EF 40-45%, G1DD  HTN HLD P: Continue telemetry monitoring Entresto plus Toprol  CKD stage 3b -Follow urine output and BMP  T2DM -Continue SSI with CBGs, goal 140-180  Depression - Continue Lexapro  GOC:  Wants to be a DNR DNI   Best Practice (right click and "Reselect all SmartList Selections" daily)   Diet/type: Regular consistency (see orders) DVT prophylaxis SCD Pressure ulcer(s): N/A GI prophylaxis: N/A Lines: N/A Foley:  N/A Code Status:  full code Last date of multidisciplinary goals of care discussion [patient]  Signature:    Josephine Igo, DO [facility] 09/11/23 9:55 AM

## 2023-09-11 NOTE — Consult Note (Signed)
Consultation Note Date: 09/11/2023   Patient Name: Jonathan [name].  DOB: 1936-08-06  MRN: [id number]  Age / Sex: 87 y.o., male  PCP: Wanda Plump, MD Referring Physician: Josephine Igo, DO  Reason for Consultation:  87 yo severe pneumonia  HPI/Patient Profile: 87 y.o. male  with past medical history of CAD s/p PCI x 2 in 2023, PVD, HTN, COPD, DM, HLD admitted on 09/08/2023 with hemoptysis. Admitted for pneumonia. Currently requiring heated high flow oxygen. Palliative consulted for goals of care.    Primary Decision Maker PATIENT - if he were unable to make decisions he would want his sister to be his decision maker- he notes he does have HCPOA and Living Will document in place- not on chart  Discussion: Chart reviewed including labs, progress notes, imaging from this and previous encounters.  Jonathan [name] in bed, awake, alert, oriented. Reports feeling much better today. He is eager to get home to his wife for whom he is the primary caretaker. He is hoping for a visit from his spouse and daughter today. Prior to admission lived at home independently, good functional state. We discussed his current acute illness. If his state were to worsen and he was unable to make decisions for himself, his sister is his HCPOA- but he expects she would work in conjunction with his daughter and son to make decisions for him.  His primary goal at this point is to continue current interventions with goal of returning home to care for his wife. He has DNR in place.     SUMMARY OF RECOMMENDATIONS -Continue current plan -PMT will follow and see as needed    Code Status/Advance Care Planning: DNR   Prognosis:   Unable to determine  Discharge Planning: To Be Determined  Primary Diagnoses: Present on Admission:  Hemoptysis   Review of Systems  Physical Exam  Vital Signs: BP 129/66   Pulse 67   Temp 97.6 F  (36.4 C) (Oral)   Resp 17   Ht 6\' 2"  (1.88 m)   Wt 92.5 kg   SpO2 97%   BMI 26.18 kg/m  Pain Scale: 0-10   Pain Score: Asleep   SpO2: SpO2: 97 % O2 Device:SpO2: 97 % O2 Flow Rate: .O2 Flow Rate (L/min): 45 L/min  IO: Intake/output summary:  Intake/Output Summary (Last 24 hours) at 09/11/2023 1223 Last data filed at 09/11/2023 0715 Gross per 24 hour  Intake 589.7 ml  Output 825 ml  Net -235.3 ml    LBM: Last BM Date : 09/09/23 Baseline Weight: Weight: 95.3 kg Most recent weight: Weight: 92.5 kg       Thank you for this consult. Palliative medicine will continue to follow and assist as needed.  Time Total: 85 minutes Signed by: Ocie Bob, AGNP-C Palliative Medicine  Time includes:   Preparing to see the patient (e.g., review of tests) Obtaining and/or reviewing separately obtained history Performing a medically necessary appropriate examination and/or evaluation Counseling and educating the patient/family/caregiver Ordering medications, tests, or procedures Referring  and communicating with other health care professionals (when not reported separately) Documenting clinical information in the electronic or other health record Independently interpreting results (not reported separately) and communicating results to the patient/family/caregiver Care coordination (not reported separately) Clinical documentation   Please contact Palliative Medicine Team phone at [phone] for questions and concerns.  For individual provider: See Loretha Stapler

## 2023-09-11 NOTE — Progress Notes (Signed)
Pt given flutter valve and educated its use and purpose. Pt able to perform flutter valve on his own.

## 2023-09-12 ENCOUNTER — Inpatient Hospital Stay (HOSPITAL_COMMUNITY): Payer: Medicare Other

## 2023-09-12 LAB — GLUCOSE, CAPILLARY
Glucose-Capillary: 131 mg/dL — ABNORMAL HIGH (ref 70–99)
Glucose-Capillary: 123 mg/dL — ABNORMAL HIGH (ref 70–99)
Glucose-Capillary: 259 mg/dL — ABNORMAL HIGH (ref 70–99)
Glucose-Capillary: 176 mg/dL — ABNORMAL HIGH (ref 70–99)

## 2023-09-12 MED ORDER — MELATONIN 3 MG PO TABS
3.0000 mg | ORAL_TABLET | Freq: Every evening | ORAL | Status: DC | PRN
  Administered 2023-09-12 – 2023-09-21 (×9): 3 mg via ORAL
  Filled 2023-09-12 (×9): qty 1

## 2023-09-12 MED ORDER — HYDROXYZINE HCL 10 MG PO TABS
10.0000 mg | ORAL_TABLET | Freq: Three times a day (TID) | ORAL | Status: DC | PRN
  Administered 2023-09-12 – 2023-09-14 (×3): 10 mg via ORAL
  Filled 2023-09-12 (×4): qty 1

## 2023-09-12 NOTE — Progress Notes (Signed)
Physical Therapy Treatment Patient Details Name: Jonathan [name]. MRN: [id number] DOB: 20-Mar-1936 Today's Date: 09/12/2023   History of Present Illness 87 y.o. male presents to [facility] hospital on 09/08/2023 with chest pain and hemoptysis. CTA with LUL infiltrate suspicious for mass. PMH includes CAD, PVD, HTN, emphysema, DMII, HLD, CKD III, CHF.    PT Comments  Pt tolerating treatment well initially during session, ambulating within room on 6L [place] and demonstrating improvement in SpO2 with initial bout of ambulation. When ambulating in hallway pt develops respiratory distress, with sats dropping quickly from high 80s to high 70s. Pt assisted back to room in chair by PT and RN. Nursing staff and respiratory therapy assist in further patient care. PT will plan to follow up once pt is medically stabilize.    If plan is discharge home, recommend the following: A little help with walking and/or transfers;A little help with bathing/dressing/bathroom;Assistance with cooking/housework;Assist for transportation;Help with stairs or ramp for entrance   Can travel by private vehicle        Equipment Recommendations  Rollator (4 wheels) (further evaluation pending progress)    Recommendations for Other Services       Precautions / Restrictions Precautions Precautions: Fall Precaution Comments: monitor O2 sats closely, progress cautiously Restrictions Weight Bearing Restrictions Per Provider Order: No     Mobility  Bed Mobility Overal bed mobility: Modified Independent                  Transfers Overall transfer level: Needs assistance Equipment used: None Transfers: Sit to/from Stand Sit to Stand: Contact guard assist                Ambulation/Gait Ambulation/Gait assistance: Contact guard assist, Min assist Gait Distance (Feet): 60 Feet (additional trial of 60' into hallway, minA to guide pt to chair when developing respiratory distress) Assistive device: None Gait  Pattern/deviations: Step-through pattern Gait velocity: reduced Gait velocity interpretation: <1.8 ft/sec, indicate of risk for recurrent falls   General Gait Details: pt with steady step-through gait with mild lateral instability with changes in direction. Pt requiring increased assistance with increased work of breathing and assisted to chair. Pushed back to room in chair by PT and RN   Stairs             Wheelchair Mobility     Tilt Bed    Modified Rankin (Stroke Patients Only)       Balance Overall balance assessment: Needs assistance Sitting-balance support: No upper extremity supported, Feet supported Sitting balance-Leahy Scale: Good     Standing balance support: No upper extremity supported, During functional activity Standing balance-Leahy Scale: Fair                              Cognition Arousal: Alert Behavior During Therapy: WFL for tasks assessed/performed Overall Cognitive Status: Within Functional Limits for tasks assessed                                          Exercises      General Comments General comments (skin integrity, edema, etc.): pt on 5L HFNC upon PT arrival sats at 91-92% in bed. Pt sits at edge of bed with decline in sats to 87%. PT increases supplemental oxygen to 6L HFNC with improvement in sats to 90%. Pt then ambulates within the room,  sats improve with ambulation to 93-94%, pt reports no significant symptoms at this time. Pt then ambulates in hallway with PT. Pt develops respiratory distress during ambulation with sats dropping into 70s on monitor. Pt assisted back to room by PT and RN in chair. RN and respiratory therapist assist in taking over care.      Pertinent Vitals/Pain Pain Assessment Pain Assessment: No/denies pain    Home Living                          Prior Function            PT Goals (current goals can now be found in the care plan section) Acute Rehab PT  Goals Patient Stated Goal: to return to independence Progress towards PT goals: Not progressing toward goals - comment (limited by respiratory distress)    Frequency    Min 1X/week      PT Plan      Co-evaluation              AM-PAC PT "6 Clicks" Mobility   Outcome Measure  Help needed turning from your back to your side while in a flat bed without using bedrails?: None Help needed moving from lying on your back to sitting on the side of a flat bed without using bedrails?: None Help needed moving to and from a bed to a chair (including a wheelchair)?: A Little Help needed standing up from a chair using your arms (e.g., wheelchair or bedside chair)?: A Little Help needed to walk in hospital room?: A Little Help needed climbing 3-5 steps with a railing? : Total 6 Click Score: 18    End of Session Equipment Utilized During Treatment: Oxygen Activity Tolerance: Treatment limited secondary to medical complications (Comment);Patient limited by fatigue (pt developed respiratory distress during session with associated hypoxia) Patient left: in bed;with call bell/phone within reach;with nursing/sitter in room Nurse Communication: Mobility status PT Visit Diagnosis: Other abnormalities of gait and mobility (R26.89)     Time: 1400-1430 PT Time Calculation (min) (ACUTE ONLY): 30 min  Charges:    $Gait Training: 8-22 mins PT General Charges $$ ACUTE PT VISIT: 1 Visit                     Arlyss Gandy, PT, DPT Acute Rehabilitation Office [phone]    Arlyss Gandy 09/12/2023, 2:53 PM

## 2023-09-12 NOTE — Progress Notes (Signed)
eLink Physician-Brief Progress Note Patient Name: Jonathan [name]. DOB: [birth year] MRN: [id number]   Date of Service  09/12/2023  HPI/Events of Note  Received request for medication to help with anxiety and allow the patient to rest.  He has been getting melatonin nightly for the past 2 nights but patient feels it has not helped.  Pt feels more anxious today.  He is on heated high flow and NRB with sats 90%.  RR is at 19-20.   Qtc 420  eICU Interventions  Trial hydroxyzine PRN for anxiety.  Continue with melatonin at bedtime.      Intervention Category Minor Interventions: Agitation / anxiety - evaluation and management  Larinda Buttery 09/12/2023, 8:42 PM

## 2023-09-12 NOTE — Progress Notes (Addendum)
NAME:  Jonathan Haran., MRN:  [id number], DOB:  1936-08-13, LOS: 3 ADMISSION DATE:  09/08/2023, CONSULTATION DATE: 09/09/2023 REFERRING MD: Rancour - EDP CHIEF COMPLAINT:  Hemoptysis   History of Present Illness:  87 year old man with PMHx significant for CAD s/p PCI x2 (12/2021), PVD s/p L SFA stent (2009), HTN, emphysema on nocturnal home O2 @ 2 L [place], DM-2, dyslipidemia, CKD-3b, and CHF (June 2024 EF 40-45%, G1DD) who is here today with chest discomfort and hemoptysis. He developed dry cough initially today and chest discomfort today without f/c/r, wheezing, DOE, SOB, LL edema, rash, URTI symptoms, N/V/D, or abd pain. When EMS arrived, he coughed fresh blood, large amount. This is never happened before. O2 was increased from 2 to 4 L [place]. He lost 50 Ib over 2 yrs intentionally and has good appetite. Denied night sweats, trauma, and previous hx of malignancy. Smoked for 30 yrs, 1 ppd, but quit 1978. Denied alcohol and illicit drug use.   Pertinent Medical History:  CAD s/p PCI x2 (12/2021), PVD s/p L SFA stent (2009), HTN, emphysema on nocturnal home O2 @ 2 L [place], DM-2, dyslipidemia, CKD-3b, and CHF (June 2024 EF 40-45%, G1DD)   Significant Hospital Events: Including procedures, antibiotic start and stop dates in addition to other pertinent events   12/22 - Admitted for hemoptysis  Interim History / Subjective:   Overall doing much better.  Patient feels better today. Continue weaning from heated high flow and flutter valve use.  Objective:  Blood pressure 123/64, pulse 68, temperature 97.8 F (36.6 C), temperature source Oral, resp. rate (!) 21, height 6\' 2"  (1.88 m), weight 92.5 kg, SpO2 95%.    FiO2 (%):  [60 %-100 %] 60 %   Intake/Output Summary (Last 24 hours) at 09/12/2023 0829 Last data filed at 09/12/2023 0600 Gross per 24 hour  Intake 100 ml  Output 1150 ml  Net -1050 ml   Filed Weights   09/08/23 2147 09/09/23 0100 09/10/23 0500  Weight: 95.3 kg 93.8 kg 92.5 kg    Physical Examination: General: Elderly gentleman no acute distress HEENT: NCAT tracking appropriately Neuro: Soft nontender nondistended CV: Regular rate rhythm S1-S2 PULM: Diminished breath sounds on the left chest compared to the right chest, no rhonchi GI: Soft nontender nondistended Extremities: No significant edema Skin: No rash  Resolved Hospital Problem List:    Assessment & Plan:   Right CAP, possible mass vs infiltrate w/ hemoptysis  Emphysema on nocturnal home O2 @ 2 L [place].  Acute of chronic hypoxic resp failure due to hemoptysis P: Overall he is doing better Continue to wean heated high flow nasal cannula Continue ceftriaxone and azithromycin Prednisone 40 mg x 5 days Continue PT OT and mobility Flutter valve and I-S encouraged I expect he probably has some remaining blood clot in the left lung as he does appear to have some lower lobe collapse.  Trying to mobilize secretions to help airway at the base. If he does not clear out may need to consider bronchoscopy but would like to have his oxygen requirements improved before then. Will need repeat ct imaging in a few weeks to document clearance   CAD s/p PCI x2 (12/2021) PVD s/p L SFA stent (2009) CHF Echo 02/2023, EF 40-45%, G1DD  HTN HLD P: Telemetry monitoring Continue Entresto plus Toprol  CKD stage 3b -Follow urine output and BMP  T2DM -Goal CBG 140-180, continue SSI  Depression - Lexapro continued  GOC:  Agree with DNR DNI  Best Practice (right click and "Reselect all SmartList Selections" daily)   Diet/type: Regular consistency (see orders) DVT prophylaxis SCD Pressure ulcer(s): N/A GI prophylaxis: N/A Lines: N/A Foley:  N/A Code Status:  full code Last date of multidisciplinary goals of care discussion [patient]  Signature:    Keturah Shavers [facility] 09/12/23 8:29 AM

## 2023-09-12 NOTE — Progress Notes (Signed)
Patient desating into the lower 70's on HHFNC 50 L 100%. NRB put back on & RT called. O2 slowly increasing back into upper 80's.

## 2023-09-12 NOTE — Progress Notes (Signed)
eLink Physician-Brief Progress Note Patient Name: Jonathan [name]. DOB: 01/23/1936 MRN: [id number]   Date of Service  09/12/2023  HPI/Events of Note  Requesting melatonin, got it the last couple nights as well  eICU Interventions  PRN dose ordered for insomnia     Intervention Category Minor Interventions: Routine modifications to care plan (e.g. PRN medications for pain, fever)  Mija Effertz G Theotis Gerdeman 09/12/2023, 2:22 AM

## 2023-09-12 NOTE — Plan of Care (Signed)
  Problem: Education: Goal: Ability to describe self-care measures that may prevent or decrease complications (Diabetes Survival Skills Education) will improve Outcome: Progressing   Problem: Coping: Goal: Ability to adjust to condition or change in health will improve Outcome: Progressing   Problem: Fluid Volume: Goal: Ability to maintain a balanced intake and output will improve Outcome: Progressing   Problem: Metabolic: Goal: Ability to maintain appropriate glucose levels will improve Outcome: Progressing   Problem: Nutritional: Goal: Maintenance of adequate nutrition will improve Outcome: Progressing   Problem: Skin Integrity: Goal: Risk for impaired skin integrity will decrease Outcome: Progressing   Problem: Tissue Perfusion: Goal: Adequacy of tissue perfusion will improve Outcome: Progressing   Problem: Activity: Goal: Risk for activity intolerance will decrease Outcome: Not Progressing

## 2023-09-12 NOTE — Progress Notes (Signed)
RT called at bedside due to desaturation. Pt's saturation in mid 60s with good waveform and increase SOB. RT placed Pt on HHFNC 45L 100 + NRB. Pt SpO2 slowly climbing up. MD aware.

## 2023-09-13 LAB — CBC
HCT: 40.3 % (ref 39.0–52.0)
Hemoglobin: 13.7 g/dL (ref 13.0–17.0)
MCH: 32.3 pg (ref 26.0–34.0)
MCHC: 34 g/dL (ref 30.0–36.0)
MCV: 95 fL (ref 80.0–100.0)
Platelets: 293 10*3/uL (ref 150–400)
RBC: 4.24 MIL/uL (ref 4.22–5.81)
RDW: 14.6 % (ref 11.5–15.5)
WBC: 13 10*3/uL — ABNORMAL HIGH (ref 4.0–10.5)
nRBC: 0 % (ref 0.0–0.2)

## 2023-09-13 LAB — BASIC METABOLIC PANEL
Anion gap: 7 (ref 5–15)
BUN: 24 mg/dL — ABNORMAL HIGH (ref 8–23)
CO2: 24 mmol/L (ref 22–32)
Calcium: 8.7 mg/dL — ABNORMAL LOW (ref 8.9–10.3)
Chloride: 108 mmol/L (ref 98–111)
Creatinine, Ser: 1.25 mg/dL — ABNORMAL HIGH (ref 0.61–1.24)
GFR, Estimated: 56 mL/min — ABNORMAL LOW (ref >60)
Glucose, Bld: 133 mg/dL — ABNORMAL HIGH (ref 70–99)
Potassium: 4.1 mmol/L (ref 3.5–5.1)
Sodium: 139 mmol/L (ref 135–145)

## 2023-09-13 LAB — LEGIONELLA PNEUMOPHILA SEROGP 1 UR AG: L. pneumophila Serogp 1 Ur Ag: NEGATIVE

## 2023-09-13 LAB — GLUCOSE, CAPILLARY
Glucose-Capillary: 141 mg/dL — ABNORMAL HIGH (ref 70–99)
Glucose-Capillary: 184 mg/dL — ABNORMAL HIGH (ref 70–99)
Glucose-Capillary: 267 mg/dL — ABNORMAL HIGH (ref 70–99)
Glucose-Capillary: 197 mg/dL — ABNORMAL HIGH (ref 70–99)
Glucose-Capillary: 196 mg/dL — ABNORMAL HIGH (ref 70–99)

## 2023-09-13 MED ORDER — ENOXAPARIN SODIUM 40 MG/0.4ML IJ SOSY
40.0000 mg | PREFILLED_SYRINGE | INTRAMUSCULAR | Status: DC
  Administered 2023-09-13 – 2023-09-23 (×11): 40 mg via SUBCUTANEOUS
  Filled 2023-09-13 (×12): qty 0.4

## 2023-09-13 MED ORDER — SODIUM CHLORIDE 0.9 % IV SOLN
2.0000 g | INTRAVENOUS | Status: AC
  Administered 2023-09-13 – 2023-09-15 (×3): 2 g via INTRAVENOUS
  Filled 2023-09-13 (×3): qty 20

## 2023-09-13 MED ORDER — FUROSEMIDE 10 MG/ML IJ SOLN
40.0000 mg | Freq: Once | INTRAMUSCULAR | Status: AC
  Administered 2023-09-13: 40 mg via INTRAVENOUS
  Filled 2023-09-13: qty 4

## 2023-09-13 NOTE — Progress Notes (Signed)
NAME:  Jonathan [name]., MRN:  [id number], DOB:  March 09, 1936, LOS: 4 ADMISSION DATE:  09/08/2023, CONSULTATION DATE: 09/09/2023 REFERRING MD: Rancour - EDP CHIEF COMPLAINT:  Hemoptysis   History of Present Illness:  87 year old man with PMHx significant for CAD s/p PCI x2 (12/2021), PVD s/p L SFA stent (2009), HTN, emphysema on nocturnal home O2 @ 2 L [place], DM-2, dyslipidemia, CKD-3b, and CHF (June 2024 EF 40-45%, G1DD) who is here today with chest discomfort and hemoptysis. He developed dry cough initially today and chest discomfort today without f/c/r, wheezing, DOE, SOB, LL edema, rash, URTI symptoms, N/V/D, or abd pain. When EMS arrived, he coughed fresh blood, large amount. This is never happened before. O2 was increased from 2 to 4 L [place]. He lost 50 Ib over 2 yrs intentionally and has good appetite. Denied night sweats, trauma, and previous hx of malignancy. Smoked for 30 yrs, 1 ppd, but quit 1978. Denied alcohol and illicit drug use.   Pertinent Medical History:  CAD s/p PCI x2 (12/2021), PVD s/p L SFA stent (2009), HTN, emphysema on nocturnal home O2 @ 2 L [place], DM-2, dyslipidemia, CKD-3b, and CHF (June 2024 EF 40-45%, G1DD)   Significant Hospital Events: Including procedures, antibiotic start and stop dates in addition to other pertinent events   12/22 - Admitted for hemoptysis  Interim History / Subjective:  Remain on high flow nasal cannula oxygen and nonrebreather facemask Desats easily even while talking X-ray chest showed improved infiltrate left lower lobe  Objective:  Blood pressure 137/79, pulse 75, temperature 97.7 F (36.5 C), temperature source Oral, resp. rate 15, height 6\' 2"  (1.88 m), weight 95 kg, SpO2 (!) 87%.    FiO2 (%):  [100 %] 100 %   Intake/Output Summary (Last 24 hours) at 09/13/2023 0856 Last data filed at 09/13/2023 0200 Gross per 24 hour  Intake 300 ml  Output 650 ml  Net -350 ml   Filed Weights   09/10/23 0500 09/12/23 2300 09/13/23 0413  Weight: 92.5  kg 95 kg 95 kg   Physical Examination: General: Elderly male, lying on the bed, on heated high flow and nonrebreather facemask HEENT: Naplate/AT, eyes anicteric.  moist mucus membranes Neuro: Alert, awake following commands Chest: Fine crackles in left lower lung zone, otherwise clear, no wheezes or rhonchi Heart: Regular rate and rhythm, no murmurs or gallops Abdomen: Soft, nontender, nondistended, bowel sounds present Skin: No rash  Labs and images reviewed  Resolved Hospital Problem List:    Assessment & Plan:  Community-acquired pneumonia possible mass vs infiltrate w/ hemoptysis  Acute of chronic hypoxic resp failure due to hemoptysis Emphysema on nocturnal home O2 @ 2 L .  Patient is still remain on high flow nasal cannula oxygen along with nonrebreather facemask Easily desats while talking No more episodes of hemoptysis On antibiotic with ceftriaxone to complete 7 days Azithromycin was completed for 5 days Continue steroid Titrate oxygen with O2 sat goal 88-92% Treat incentive spirometry and flutter valve He may need a bronch to clear blood clots in the left lower lung zone once oxygenation improves Probably will need CT chest at some point for follow-up  CAD s/p PCI x2 (12/2021) PVD s/p L SFA stent (2009) CHF Echo 02/2023, EF 40-45%, G1DD  HTN HLD Continue telemetry monitoring Continue Entresto plus Toprol Hold Plavix  AKI on CKD stage 3a Serum creatinine trended down to baseline which is around 1.1-1.2 Monitor intake and output Avoid nephrotoxic agent  T2DM Continue sliding scale insulin CBG  goal 140-180  Depression Continue Lexapro  GOC:  Agree with DNR DNI    Best Practice (right click and "Reselect all SmartList Selections" daily)   Diet/type: Regular consistency (see orders) DVT prophylaxis SCD Pressure ulcer(s): N/A GI prophylaxis: N/A Lines: N/A Foley:  N/A Code Status:  full code Last date of multidisciplinary goals of care discussion  [patient]     Jonathan Fowler, MD [facility] See Amion for pager If no response to pager, please call [phone] until 7pm After 7pm, Please call E-link [phone]

## 2023-09-13 NOTE — Plan of Care (Signed)
  Problem: Coping: Goal: Ability to adjust to condition or change in health will improve Outcome: Progressing   Problem: Fluid Volume: Goal: Ability to maintain a balanced intake and output will improve Outcome: Progressing   Problem: Health Behavior/Discharge Planning: Goal: Ability to identify and utilize available resources and services will improve Outcome: Progressing   Problem: Health Behavior/Discharge Planning: Goal: Ability to manage health-related needs will improve Outcome: Progressing   Problem: Metabolic: Goal: Ability to maintain appropriate glucose levels will improve Outcome: Progressing   

## 2023-09-14 ENCOUNTER — Other Ambulatory Visit: Payer: Self-pay

## 2023-09-14 LAB — MAGNESIUM: Magnesium: 2.3 mg/dL (ref 1.7–2.4)

## 2023-09-14 LAB — BASIC METABOLIC PANEL
Anion gap: 9 (ref 5–15)
BUN: 27 mg/dL — ABNORMAL HIGH (ref 8–23)
CO2: 27 mmol/L (ref 22–32)
Calcium: 8.7 mg/dL — ABNORMAL LOW (ref 8.9–10.3)
Chloride: 104 mmol/L (ref 98–111)
Creatinine, Ser: 1.28 mg/dL — ABNORMAL HIGH (ref 0.61–1.24)
GFR, Estimated: 55 mL/min — ABNORMAL LOW (ref >60)
Glucose, Bld: 117 mg/dL — ABNORMAL HIGH (ref 70–99)
Potassium: 4.2 mmol/L (ref 3.5–5.1)
Sodium: 140 mmol/L (ref 135–145)

## 2023-09-14 LAB — GLUCOSE, CAPILLARY
Glucose-Capillary: 120 mg/dL — ABNORMAL HIGH (ref 70–99)
Glucose-Capillary: 143 mg/dL — ABNORMAL HIGH (ref 70–99)
Glucose-Capillary: 252 mg/dL — ABNORMAL HIGH (ref 70–99)
Glucose-Capillary: 322 mg/dL — ABNORMAL HIGH (ref 70–99)

## 2023-09-14 LAB — CULTURE, BLOOD (ROUTINE X 2)
Culture: NO GROWTH
Culture: NO GROWTH
Special Requests: ADEQUATE

## 2023-09-14 MED ORDER — FUROSEMIDE 10 MG/ML IJ SOLN
40.0000 mg | Freq: Once | INTRAMUSCULAR | Status: AC
  Administered 2023-09-14: 40 mg via INTRAVENOUS
  Filled 2023-09-14: qty 4

## 2023-09-14 NOTE — Plan of Care (Signed)
  Problem: Fluid Volume: Goal: Ability to maintain a balanced intake and output will improve Outcome: Progressing   Problem: Metabolic: Goal: Ability to maintain appropriate glucose levels will improve Outcome: Progressing   Problem: Nutritional: Goal: Maintenance of adequate nutrition will improve Outcome: Progressing   Problem: Education: Goal: Knowledge of General Education information will improve Description: Including pain rating scale, medication(s)/side effects and non-pharmacologic comfort measures Outcome: Progressing   Problem: Health Behavior/Discharge Planning: Goal: Ability to manage health-related needs will improve Outcome: Progressing   Problem: Clinical Measurements: Goal: Ability to maintain clinical measurements within normal limits will improve Outcome: Progressing Goal: Will remain free from infection Outcome: Progressing Goal: Diagnostic test results will improve Outcome: Progressing Goal: Respiratory complications will improve Outcome: Progressing Goal: Cardiovascular complication will be avoided Outcome: Progressing   Problem: Activity: Goal: Risk for activity intolerance will decrease Outcome: Progressing   Problem: Coping: Goal: Level of anxiety will decrease Outcome: Progressing

## 2023-09-14 NOTE — Progress Notes (Signed)
NAME:  Jonathan Woodlee., MRN:  [id number], DOB:  May 12, 1936, LOS: 5 ADMISSION DATE:  09/08/2023, CONSULTATION DATE: 09/09/2023 REFERRING MD: Rancour - EDP CHIEF COMPLAINT:  Hemoptysis   History of Present Illness:  87 year old man with PMHx significant for CAD s/p PCI x2 (12/2021), PVD s/p L SFA stent (2009), HTN, emphysema on nocturnal home O2 @ 2 L [place], DM-2, dyslipidemia, CKD-3b, and CHF (June 2024 EF 40-45%, G1DD) who is here today with chest discomfort and hemoptysis. He developed dry cough initially today and chest discomfort today without f/c/r, wheezing, DOE, SOB, LL edema, rash, URTI symptoms, N/V/D, or abd pain. When EMS arrived, he coughed fresh blood, large amount. This is never happened before. O2 was increased from 2 to 4 L [place]. He lost 50 Ib over 2 yrs intentionally and has good appetite. Denied night sweats, trauma, and previous hx of malignancy. Smoked for 30 yrs, 1 ppd, but quit 1978. Denied alcohol and illicit drug use.   Pertinent Medical History:  CAD s/p PCI x2 (12/2021), PVD s/p L SFA stent (2009), HTN, emphysema on nocturnal home O2 @ 2 L [place], DM-2, dyslipidemia, CKD-3b, and CHF (June 2024 EF 40-45%, G1DD)   Significant Hospital Events: Including procedures, antibiotic start and stop dates in addition to other pertinent events   12/22 - Admitted for hemoptysis  Interim History / Subjective:   Remains on high flow nasal cannula at 50 L and 90% FiO2   Objective:  Blood pressure 139/70, pulse 70, temperature 97.6 F (36.4 C), temperature source Oral, resp. rate (!) 21, height 6\' 2"  (1.88 m), weight 97.3 kg, SpO2 (!) 89%.    FiO2 (%):  [90 %-100 %] 90 %   Intake/Output Summary (Last 24 hours) at 09/14/2023 0935 Last data filed at 09/14/2023 0100 Gross per 24 hour  Intake 745.17 ml  Output 1975 ml  Net -1229.83 ml   Filed Weights   09/12/23 2300 09/13/23 0413 09/14/23 0500  Weight: 95 kg 95 kg 97.3 kg   Physical Examination: Blood pressure 139/70, pulse 70,  temperature 97.6 F (36.4 C), temperature source Oral, resp. rate (!) 21, height 6\' 2"  (1.88 m), weight 97.3 kg, SpO2 (!) 89%. Gen:      No acute distress, Sitting in chair at bedisde HEENT:  EOMI, sclera anicteric Neck:     No masses; no thyromegaly Lungs:    Clear to auscultation bilaterally; normal respiratory effort CV:         Regular rate and rhythm; no murmurs Abd:      + bowel sounds; soft, non-tender; no palpable masses, no distension Ext:    No edema; adequate peripheral perfusion Skin:      Warm and dry; no rash Neuro: alert and oriented x 3 Psych: normal mood and affect   Lab/imaging reviewed Significant for BUN/creatinine 27/1.28 No new imaging   Resolved Hospital Problem List:    Assessment & Plan:  Community-acquired pneumonia possible mass vs infiltrate w/ hemoptysis  Acute of chronic hypoxic resp failure due to hemoptysis Emphysema on nocturnal home O2 @ 2 L [place].  Wean down high flow nasal cannula as tolerated Hemoptysis appears to have resolved Continue ceftriaxone for 7 days.  Finished 5 days of azithromycin Continue prednisone, bronchodilators as needed Incentive spirometry, flutter valve May need bronchoscopy, CT scan when O2 requirements have improved   CAD s/p PCI x2 (12/2021) PVD s/p L SFA stent (2009) CHF Echo 02/2023, EF 40-45%, G1DD  HTN HLD Telemetry monitoring Entresto, Toprol Holding Plavix  due to hemoptysis Repeat Lasix 40 mg x 1  AKI on CKD stage 3a Serum creatinine trended down to baseline which is around 1.1-1.2 Monitor intake and output Avoid nephrotoxic agent Diuresis as above  T2DM SSI coverage  Depression Continue Lexapro  Goals of care DNR, DNI Palliative care is on board.  Best Practice (right click and "Reselect all SmartList Selections" daily)   Diet/type: Regular consistency (see orders) DVT prophylaxis SCD Pressure ulcer(s): N/A GI prophylaxis: N/A Lines: N/A Foley:  N/A Code Status:  full code Last date of  multidisciplinary goals of care discussion [patient]  The patient is critically ill with multiple organ system failure and requires high complexity decision making for assessment and support, frequent evaluation and titration of therapies, advanced monitoring, review of radiographic studies and interpretation of complex data.   Critical Care Time devoted to patient care services, exclusive of separately billable procedures, described in this note is 35 minutes.   Chilton Greathouse MD [facility] See Amion for pager  If no response to pager , please call [phone] until 7pm After 7:00 pm call Elink  [phone] 09/14/2023, 9:44 AM

## 2023-09-15 ENCOUNTER — Inpatient Hospital Stay (HOSPITAL_COMMUNITY): Payer: Medicare Other

## 2023-09-15 LAB — CBC
HCT: 39.1 % (ref 39.0–52.0)
Hemoglobin: 13.3 g/dL (ref 13.0–17.0)
MCH: 32.2 pg (ref 26.0–34.0)
MCHC: 34 g/dL (ref 30.0–36.0)
MCV: 94.7 fL (ref 80.0–100.0)
Platelets: 313 10*3/uL (ref 150–400)
RBC: 4.13 MIL/uL — ABNORMAL LOW (ref 4.22–5.81)
RDW: 15.2 % (ref 11.5–15.5)
WBC: 15.1 10*3/uL — ABNORMAL HIGH (ref 4.0–10.5)
nRBC: 0 % (ref 0.0–0.2)

## 2023-09-15 LAB — GLUCOSE, CAPILLARY
Glucose-Capillary: 114 mg/dL — ABNORMAL HIGH (ref 70–99)
Glucose-Capillary: 123 mg/dL — ABNORMAL HIGH (ref 70–99)
Glucose-Capillary: 233 mg/dL — ABNORMAL HIGH (ref 70–99)
Glucose-Capillary: 350 mg/dL — ABNORMAL HIGH (ref 70–99)

## 2023-09-15 LAB — BASIC METABOLIC PANEL
Anion gap: 7 (ref 5–15)
BUN: 32 mg/dL — ABNORMAL HIGH (ref 8–23)
CO2: 24 mmol/L (ref 22–32)
Calcium: 8.5 mg/dL — ABNORMAL LOW (ref 8.9–10.3)
Chloride: 108 mmol/L (ref 98–111)
Creatinine, Ser: 1.3 mg/dL — ABNORMAL HIGH (ref 0.61–1.24)
GFR, Estimated: 54 mL/min — ABNORMAL LOW (ref >60)
Glucose, Bld: 170 mg/dL — ABNORMAL HIGH (ref 70–99)
Potassium: 3.8 mmol/L (ref 3.5–5.1)
Sodium: 139 mmol/L (ref 135–145)

## 2023-09-15 MED ORDER — FUROSEMIDE 10 MG/ML IJ SOLN
40.0000 mg | Freq: Once | INTRAMUSCULAR | Status: AC
  Administered 2023-09-15: 40 mg via INTRAVENOUS
  Filled 2023-09-15: qty 4

## 2023-09-15 MED ORDER — CLOPIDOGREL BISULFATE 75 MG PO TABS
75.0000 mg | ORAL_TABLET | Freq: Every day | ORAL | Status: DC
  Administered 2023-09-15 – 2023-09-23 (×9): 75 mg via ORAL
  Filled 2023-09-15 (×9): qty 1

## 2023-09-15 MED ORDER — INSULIN GLARGINE-YFGN 100 UNIT/ML ~~LOC~~ SOLN
10.0000 [IU] | Freq: Every day | SUBCUTANEOUS | Status: DC
  Administered 2023-09-15 – 2023-09-22 (×8): 10 [IU] via SUBCUTANEOUS
  Filled 2023-09-15 (×10): qty 0.1

## 2023-09-15 NOTE — Plan of Care (Signed)
°  Problem: Education: Goal: Ability to describe self-care measures that may prevent or decrease complications (Diabetes Survival Skills Education) will improve Outcome: Progressing   Problem: Coping: Goal: Ability to adjust to condition or change in health will improve Outcome: Progressing   Problem: Fluid Volume: Goal: Ability to maintain a balanced intake and output will improve Outcome: Progressing   Problem: Metabolic: Goal: Ability to maintain appropriate glucose levels will improve Outcome: Progressing   Problem: Nutritional: Goal: Maintenance of adequate nutrition will improve Outcome: Progressing   Problem: Clinical Measurements: Goal: Ability to maintain clinical measurements within normal limits will improve Outcome: Progressing Goal: Will remain free from infection Outcome: Progressing Goal: Diagnostic test results will improve Outcome: Progressing Goal: Respiratory complications will improve Outcome: Progressing Goal: Cardiovascular complication will be avoided Outcome: Progressing   Problem: Coping: Goal: Level of anxiety will decrease Outcome: Progressing

## 2023-09-15 NOTE — Progress Notes (Addendum)
NAME:  Jonathan [name]., MRN:  [id number], DOB:  [birth year], LOS: 6 ADMISSION DATE:  09/08/2023, CONSULTATION DATE: 09/09/2023 REFERRING MD: Rancour - EDP CHIEF COMPLAINT:  Hemoptysis   History of Present Illness:  87 year old man with PMHx significant for CAD s/p PCI x2 (12/2021), PVD s/p L SFA stent (2009), HTN, emphysema on nocturnal home O2 @ 2 L [place], DM-2, dyslipidemia, CKD-3b, and CHF (June 2024 EF 40-45%, G1DD) who is here today with chest discomfort and hemoptysis. He developed dry cough initially today and chest discomfort today without f/c/r, wheezing, DOE, SOB, LL edema, rash, URTI symptoms, N/V/D, or abd pain. When EMS arrived, he coughed fresh blood, large amount. This is never happened before. O2 was increased from 2 to 4 L [place]. He lost 50 Ib over 2 yrs intentionally and has good appetite. Denied night sweats, trauma, and previous hx of malignancy. Smoked for 30 yrs, 1 ppd, but quit 1978. Denied alcohol and illicit drug use.   Pertinent Medical History:  CAD s/p PCI x2 (12/2021), PVD s/p L SFA stent (2009), HTN, emphysema on nocturnal home O2 @ 2 L [name], DM-2, dyslipidemia, CKD-3b, and CHF (June 2024 EF 40-45%, G1DD)   Significant Hospital Events: Including procedures, antibiotic start and stop dates in addition to other pertinent events   12/22 - Admitted for hemoptysis  Interim History / Subjective:   Remains on high flow nasal cannula at 50 L.  FiO2 is improved to 70%   Objective:  Blood pressure (!) 102/53, pulse (!) 56, temperature (!) 97 F (36.1 C), temperature source Oral, resp. rate 15, height 6\' 2"  (1.88 m), weight 97.3 kg, SpO2 100%.    FiO2 (%):  [70 %-90 %] 70 %   Intake/Output Summary (Last 24 hours) at 09/15/2023 3329 Last data filed at 09/15/2023 0300 Gross per 24 hour  Intake 100 ml  Output 1800 ml  Net -1700 ml   Filed Weights   09/12/23 2300 09/13/23 0413 09/14/23 0500  Weight: 95 kg 95 kg 97.3 kg   Physical Examination: Gen:      No acute  distress HEENT:  EOMI, sclera anicteric Neck:     No masses; no thyromegaly Lungs:    Clear to auscultation bilaterally; normal respiratory effort CV:         Regular rate and rhythm; no murmurs Abd:      + bowel sounds; soft, non-tender; no palpable masses, no distension Ext:    No edema; adequate peripheral perfusion Skin:      Warm and dry; no rash Neuro: alert and oriented x 3 Psych: normal mood and affect   Lab/imaging reviewed Significant for BUN/creatinine 139/1.30 WBC 15.1 No new imaging  Resolved Hospital Problem List:    Assessment & Plan:  Community-acquired pneumonia possible mass vs infiltrate w/ hemoptysis  Acute of chronic hypoxic resp failure due to hemoptysis Emphysema on nocturnal home O2 @ 2 L [place].  Wean down high flow nasal cannula as tolerated Hemoptysis appears to have resolved Continue ceftriaxone for 7 days.  Finished 5 days of azithromycin Continue prednisone, bronchodilators as needed Incentive spirometry, flutter valve Need CT follow up of LUL mass like consolidation and bronchoscopy if it is not improving  CAD s/p PCI x2 (12/2021) PVD s/p L SFA stent (2009) CHF Echo 02/2023, EF 40-45%, G1DD  HTN HLD Telemetry monitoring Entresto, Toprol Holding Plavix due to hemoptysis Repeat Lasix 40 mg x 1  AKI on CKD stage 3a Monitor intake and output Avoid nephrotoxic agent Diuresis  as above  T2DM SSI coverage  Depression Continue Lexapro  Goals of care DNR, DNI Palliative care is on board.   Best Practice (right click and "Reselect all SmartList Selections" daily)   Diet/type: Regular consistency (see orders) DVT prophylaxis SCD Pressure ulcer(s): N/A GI prophylaxis: N/A Lines: N/A Foley:  N/A Code Status:  full code Last date of multidisciplinary goals of care discussion [patient]  The patient is critically ill with multiple organ system failure and requires high complexity decision making for assessment and support, frequent evaluation  and titration of therapies, advanced monitoring, review of radiographic studies and interpretation of complex data.   Critical Care Time devoted to patient care services, exclusive of separately billable procedures, described in this note is 35 minutes.   Chilton Greathouse MD [facility] See Amion for pager  If no response to pager , please call [phone] until 7pm After 7:00 pm call Elink  [phone] 09/15/2023, 9:51 AM

## 2023-09-15 NOTE — Progress Notes (Signed)
eLink Physician-Brief Progress Note Patient Name: Jonathan [name]. DOB: 10/24/1935 MRN: [id number]   Date of Service  09/15/2023  HPI/Events of Note  Hyperglycemia  eICU Interventions  Long acting insulin    Off steroids Increased BG  Will resume Trulicity at home For now: we will use longacting insulin    Massie Maroon 09/15/2023, 9:32 PM

## 2023-09-16 ENCOUNTER — Inpatient Hospital Stay (HOSPITAL_COMMUNITY): Payer: Medicare Other

## 2023-09-16 LAB — GLUCOSE, CAPILLARY
Glucose-Capillary: 122 mg/dL — ABNORMAL HIGH (ref 70–99)
Glucose-Capillary: 103 mg/dL — ABNORMAL HIGH (ref 70–99)
Glucose-Capillary: 193 mg/dL — ABNORMAL HIGH (ref 70–99)
Glucose-Capillary: 422 mg/dL — ABNORMAL HIGH (ref 70–99)

## 2023-09-16 LAB — BASIC METABOLIC PANEL
Anion gap: 8 (ref 5–15)
BUN: 35 mg/dL — ABNORMAL HIGH (ref 8–23)
CO2: 24 mmol/L (ref 22–32)
Calcium: 8.3 mg/dL — ABNORMAL LOW (ref 8.9–10.3)
Chloride: 105 mmol/L (ref 98–111)
Creatinine, Ser: 1.35 mg/dL — ABNORMAL HIGH (ref 0.61–1.24)
GFR, Estimated: 51 mL/min — ABNORMAL LOW (ref >60)
Glucose, Bld: 170 mg/dL — ABNORMAL HIGH (ref 70–99)
Potassium: 3.8 mmol/L (ref 3.5–5.1)
Sodium: 137 mmol/L (ref 135–145)

## 2023-09-16 LAB — CBC
HCT: 38.3 % — ABNORMAL LOW (ref 39.0–52.0)
Hemoglobin: 13.1 g/dL (ref 13.0–17.0)
MCH: 32.6 pg (ref 26.0–34.0)
MCHC: 34.2 g/dL (ref 30.0–36.0)
MCV: 95.3 fL (ref 80.0–100.0)
Platelets: 340 10*3/uL (ref 150–400)
RBC: 4.02 MIL/uL — ABNORMAL LOW (ref 4.22–5.81)
RDW: 15.5 % (ref 11.5–15.5)
WBC: 15.9 10*3/uL — ABNORMAL HIGH (ref 4.0–10.5)
nRBC: 0 % (ref 0.0–0.2)

## 2023-09-16 MED ORDER — INSULIN ASPART 100 UNIT/ML IV SOLN
10.0000 [IU] | Freq: Once | INTRAVENOUS | Status: AC
  Administered 2023-09-16: 10 [IU] via INTRAVENOUS

## 2023-09-16 MED ORDER — FUROSEMIDE 10 MG/ML IJ SOLN
40.0000 mg | Freq: Once | INTRAMUSCULAR | Status: AC
  Administered 2023-09-16: 40 mg via INTRAVENOUS
  Filled 2023-09-16: qty 4

## 2023-09-16 MED ORDER — PREDNISONE 20 MG PO TABS
40.0000 mg | ORAL_TABLET | Freq: Every day | ORAL | Status: DC
  Administered 2023-09-16 – 2023-09-23 (×8): 40 mg via ORAL
  Filled 2023-09-16 (×8): qty 2

## 2023-09-16 NOTE — Plan of Care (Signed)
Received care of pt in chair watching tv, alert and oriented x4. POC reviewed with patient. 2200 Blood sugar 350 mg/dl, N.O. received for long acting insulin. No s/s of hyperglycemia. Pt  refused bath, only allowing  the tech to do a partial bath. Pt desating to 85-86%, Resp Therapist in to adjust settings for [facility], no further desats noted. Afebrile. B/P recycled a number of times because pt would have his arm bent while b/p was being taken. Good uop. Pt denies having any further hemoptysis, has an occasional dry non productive cough. Will continue to monitor.

## 2023-09-16 NOTE — Progress Notes (Signed)
eLink Physician-Brief Progress Note Patient Name: Jonathan [name]. DOB: 09-12-1936 MRN: [id number]   Date of Service  09/16/2023  HPI/Events of Note  87 year old man with PMHx significant for CAD s/p PCI x2 (12/2021), PVD s/p L SFA stent (2009), HTN, emphysema on nocturnal home O2 @ 2 L [place], DM-2, dyslipidemia, CKD-3b, and CHF (June 2024 EF 40-45%, G1DD) who is here today with chest discomfort and hemoptysis   Patient was noted to be hyperglycemic-CBG 422 after eating a finger bar recently.  eICU Interventions  Maintain glargine 10 units.  Add additional 10 units of short acting insulin.     Intervention Category Intermediate Interventions: Hyperglycemia - evaluation and treatment  Kalise Fickett 09/16/2023, 9:19 PM

## 2023-09-16 NOTE — Progress Notes (Signed)
Occupational Therapy Treatment Patient Details Name: Jonathan [name]. MRN: [id number] DOB: 1936-03-21 Today's Date: 09/16/2023   History of present illness 87 y.o. male presents to [facility] hospital on 09/08/2023 with chest pain and hemoptysis. CTA with LUL infiltrate suspicious for mass. PMH includes CAD, PVD, HTN, emphysema, DMII, HLD, CKD III, CHF.   OT comments  Pt declined bathing and dressing, had completed with night shift. Educated pt in maximizing participation in ADLs to increase activity tolerance whether with therapy or nursing staff. Pt completed seated grooming with set up. Stood with CGA for pressure relief. Instructed in HEP for B UE strength and endurance with pt returning demonstration. Pt with SpO2 of 94% on 10L O2 during OT activities.       If plan is discharge home, recommend the following:  Assistance with cooking/housework;Assist for transportation;Help with stairs or ramp for entrance   Equipment Recommendations  Tub/shower seat    Recommendations for Other Services      Precautions / Restrictions Precautions Precautions: Fall Precaution Comments: monitor O2 sats closely, progress cautiously       Mobility Bed Mobility               General bed mobility comments: pt received and returned to chair    Transfers Overall transfer level: Needs assistance Equipment used: None Transfers: Sit to/from Stand Sit to Stand: Contact guard assist           General transfer comment: stood for pressure relief     Balance Overall balance assessment: Needs assistance   Sitting balance-Leahy Scale: Good       Standing balance-Leahy Scale: Fair                             ADL either performed or assessed with clinical judgement   ADL Overall ADL's : Needs assistance/impaired     Grooming: Wash/dry hands;Wash/dry face;Oral care;Sitting;Set up                                      Extremity/Trunk Assessment               Vision       Perception     Praxis      Cognition Arousal: Alert Behavior During Therapy: WFL for tasks assessed/performed Overall Cognitive Status: Within Functional Limits for tasks assessed                                          Exercises Other Exercises Other Exercises: Issued green theraband and instructed in use. Pt returned demonstration of horizontal abduction, FF with both R and L. Educated to monitor for symptoms of fatigue and gauge repetitions and resistance.    Shoulder Instructions       General Comments     Pertinent Vitals/ Pain       Pain Assessment Pain Assessment: No/denies pain  Home Living                                          Prior Functioning/Environment              Frequency  Min 1X/week  Progress Toward Goals  OT Goals(current goals can now be found in the care plan section)  Progress towards OT goals: Progressing toward goals  Acute Rehab OT Goals OT Goal Formulation: With patient Time For Goal Achievement: 09/24/23 Potential to Achieve Goals: Good  Plan      Co-evaluation                 AM-PAC OT "6 Clicks" Daily Activity     Outcome Measure   Help from another person eating meals?: None Help from another person taking care of personal grooming?: A Little Help from another person toileting, which includes using toliet, bedpan, or urinal?: A Little Help from another person bathing (including washing, rinsing, drying)?: A Little Help from another person to put on and taking off regular upper body clothing?: A Little Help from another person to put on and taking off regular lower body clothing?: A Little 6 Click Score: 19    End of Session Equipment Utilized During Treatment: Gait belt;Oxygen (10L)  OT Visit Diagnosis: Other (comment) (decreased activity tolerance)   Activity Tolerance Patient tolerated treatment well   Patient Left in chair;with call  bell/phone within reach   Nurse Communication          Time: 1137-1201 OT Time Calculation (min): 24 min  Charges: OT General Charges $OT Visit: 1 Visit OT Treatments $Self Care/Home Management : 8-22 mins $Therapeutic Exercise: 8-22 mins  [name], OTR/L Acute Rehabilitation Services Office: [phone]   Evern Bio 09/16/2023, 12:09 PM

## 2023-09-16 NOTE — Progress Notes (Signed)
     Referral previously received for [name]. for goals of care discussion. Noted most recent palliative in-person assessment dated 09/11/2023 at which time it was recommended to continue with current plan. Request was left to follow from a distance/chart check for decompensation.   Chart reviewed for Recent provider notes, nurse notes, vitals, labs, and imaging and updates received from RN.   At this time patient appears stable/improved. At last palliative note patient was on HHFNC 50 lpm, 100% FiO2. Today he is on HFNC 10 lpm. Planned CT to follow-up LUL mass like consolidation and bronch if no improvement. He does desaturate with exertion, but overall seems improved or at least stable. No plan for in person follow-up at this time. Please contact the palliative medicine provider on service for any new/urgent needs that require our assistance with this patient. In the meantime we will continue to shadow for clinical change.  Thank you for your referral and allowing PMT to assist in KANALOA LACHANCE Jr.'s care.   Wynne Dust, NP Palliative Medicine Team Phone: [phone]  NO CHARGE

## 2023-09-16 NOTE — Progress Notes (Addendum)
NAME:  Jonathan [name]., MRN:  [id number], DOB:  06-Jun-1936, LOS: 7 ADMISSION DATE:  09/08/2023, CONSULTATION DATE: 09/09/2023 REFERRING MD: [place] - EDP CHIEF COMPLAINT:  Hemoptysis   History of Present Illness:  87 year old man with PMHx significant for CAD s/p PCI x2 (12/2021), PVD s/p L SFA stent (2009), HTN, emphysema on nocturnal home O2 @ 2 L [place], DM-2, dyslipidemia, CKD-3b, and CHF (June 2024 EF 40-45%, G1DD) who is here today with chest discomfort and hemoptysis. He developed dry cough initially today and chest discomfort today without f/c/r, wheezing, DOE, SOB, LL edema, rash, URTI symptoms, N/V/D, or abd pain. When EMS arrived, he coughed fresh blood, large amount. This is never happened before. O2 was increased from 2 to 4 L [place]. He lost 50 Ib over 2 yrs intentionally and has good appetite. Denied night sweats, trauma, and previous hx of malignancy. Smoked for 30 yrs, 1 ppd, but quit 1978. Denied alcohol and illicit drug use.   Pertinent Medical History:  CAD s/p PCI x2 (12/2021), PVD s/p L SFA stent (2009), HTN, emphysema on nocturnal home O2 @ 2 L [place], DM-2, dyslipidemia, CKD-3b, and CHF (June 2024 EF 40-45%, G1DD)   Significant Hospital Events: Including procedures, antibiotic start and stop dates in addition to other pertinent events   12/22 - Admitted for hemoptysis.  CTA with no PE.  Noted rounded masslike opacity in the left upper lobe.  Interim History / Subjective:   Remains on high flow nasal cannula.  Continues to desat on exertion.   Objective:  Blood pressure (!) 114/53, pulse 63, temperature 98.1 F (36.7 C), temperature source Oral, resp. rate 18, height 6\' 2"  (1.88 m), weight 97.3 kg, SpO2 94%.    FiO2 (%):  [40 %-60 %] 45 %   Intake/Output Summary (Last 24 hours) at 09/16/2023 0902 Last data filed at 09/16/2023 0718 Gross per 24 hour  Intake 480 ml  Output 1750 ml  Net -1270 ml   Filed Weights   09/12/23 2300 09/13/23 0413 09/14/23 0500  Weight: 95 kg 95  kg 97.3 kg   Physical Examination: Blood pressure (!) 114/53, pulse 63, temperature 98.1 F (36.7 C), temperature source Oral, resp. rate 18, height 6\' 2"  (1.88 m), weight 97.3 kg, SpO2 94%. Gen:      No acute distress HEENT:  EOMI, sclera anicteric Neck:     No masses; no thyromegaly Lungs:    Clear to auscultation bilaterally; normal respiratory effort CV:         Regular rate and rhythm; no murmurs Abd:      + bowel sounds; soft, non-tender; no palpable masses, no distension Ext:    No edema; adequate peripheral perfusion Skin:      Warm and dry; no rash Neuro: alert and oriented x 3 Psych: normal mood and affect   Lab/imaging reviewed Significant for BUN/creatinine 35/1.35 WBC 15.9, hemoglobin 13.1, platelets 340 Chest x-ray is pending  Resolved Hospital Problem List:    Assessment & Plan:  Community-acquired pneumonia possible mass vs infiltrate w/ hemoptysis  Acute of chronic hypoxic resp failure due to hemoptysis Emphysema on nocturnal home O2 @ 2 L .  Wean down high flow nasal cannula as tolerated Hemoptysis appears to have resolved Continue ceftriaxone for 7 days.  Finished 5 days of azithromycin Continue prednisone at 40mg /day, bronchodilators Incentive spirometry, flutter valve Need CT follow up of LUL mass like consolidation and bronchoscopy if it is not improving  CAD s/p PCI x2 (12/2021) PVD s/p  L SFA stent (2009) CHF Echo 02/2023, EF 40-45%, G1DD  HTN HLD Telemetry monitoring Entresto, Toprol Resume Plavix as his hemoptysis has resolved Repeat Lasix 40 mg x 1  AKI on CKD stage 3a Monitor intake and output Avoid nephrotoxic agent Diuresis as above  T2DM SSI coverage  Depression Continue Lexapro  Goals of care DNR, DNI Palliative care is on board.  Best Practice (right click and "Reselect all SmartList Selections" daily)   Diet/type: Regular consistency (see orders) DVT prophylaxis SCD Pressure ulcer(s): N/A GI prophylaxis: N/A Lines:  N/A Foley:  N/A Code Status:  full code Last date of multidisciplinary goals of care discussion [patient]  The patient is critically ill with multiple organ system failure and requires high complexity decision making for assessment and support, frequent evaluation and titration of therapies, advanced monitoring, review of radiographic studies and interpretation of complex data.   Critical Care Time devoted to patient care services, exclusive of separately billable procedures, described in this note is 35 minutes.   Chilton Greathouse MD [facility] See Amion for pager  If no response to pager , please call [phone] until 7pm After 7:00 pm call Elink  [phone] 09/16/2023, 9:02 AM

## 2023-09-16 NOTE — Progress Notes (Signed)
Physical Therapy Treatment Patient Details Name: Jonathan [name]. MRN: [id number] DOB: 02-Jun-1936 Today's Date: 09/16/2023   History of Present Illness 87 y.o. male presents to [facility] hospital on 09/08/2023 with chest pain and hemoptysis. CTA with LUL infiltrate suspicious for mass. PMH includes CAD, PVD, HTN, emphysema, DMII, HLD, CKD III, CHF.    PT Comments  Pt did much better today than last session maintaining sats 80-90% on 10L O2 HFNC.  Pt was able to do repeated sets of standing exercises, IS and FV and mobilize with contact guard assist and no device.  I believe he is ready to go back to attempting hallway gait.  Bring rollator as this is what he has at home and can help if he continues to decompensate in the hallway with gait. PT will continue to follow acutely for safe mobility progression.   If plan is discharge home, recommend the following: A little help with walking and/or transfers;A little help with bathing/dressing/bathroom;Assistance with cooking/housework;Assist for transportation;Help with stairs or ramp for entrance   Can travel by private vehicle        Equipment Recommendations  Other (comment) (pt has a rollator at home and a regular walker, also appears to have home O2)    Recommendations for Other Services       Precautions / Restrictions Precautions Precautions: Fall Precaution Comments: monitor O2 sats closely, progress cautiously     Mobility  Bed Mobility Overal bed mobility: Modified Independent             General bed mobility comments: pt moving slowly primarily due to significant number of lines    Transfers Overall transfer level: Needs assistance Equipment used: None Transfers: Sit to/from Stand Sit to Stand: Contact guard assist           General transfer comment: Contact guard on initial stand due to small stagger backwards.  On repeated stands supervision only needed.    Ambulation/Gait               General Gait  Details: remained conservative today due to his reaction to hallway gait last session, however, he is ready for hallway gait again, I would recommend a rollator so that he can sit and take rest breaks periodically if his breathing worsens in the hall.  He has a rollator at home he can use and a chair lift to get upstairs.   Stairs             Wheelchair Mobility     Tilt Bed    Modified Rankin (Stroke Patients Only)       Balance Overall balance assessment: Needs assistance Sitting-balance support: No upper extremity supported, Feet supported Sitting balance-Leahy Scale: Good     Standing balance support: Bilateral upper extremity supported, Single extremity supported, No upper extremity supported Standing balance-Leahy Scale: Fair Standing balance comment: close supervision in standing.                            Cognition Arousal: Alert Behavior During Therapy: WFL for tasks assessed/performed Overall Cognitive Status: Within Functional Limits for tasks assessed                                          Exercises General Exercises - Lower Extremity Hip Flexion/Marching: AROM, Both, 20 reps, Standing (2 sets) Mini-Sqauts: AROM, Both,  10 reps, Standing Other Exercises Other Exercises: IS x 10 reps max inhaled volume 1250 mL, cues for slower speed; Flutter valve x 10 reps    General Comments General comments (skin integrity, edema, etc.): O2 stas fluctuated 80s-90s throughout session on 10 L O2 HFNC.      Pertinent Vitals/Pain Pain Assessment Pain Assessment: No/denies pain    Home Living                          Prior Function            PT Goals (current goals can now be found in the care plan section) Acute Rehab PT Goals Patient Stated Goal: to return to independence Progress towards PT goals: Progressing toward goals    Frequency    Min 1X/week      PT Plan      Co-evaluation               AM-PAC PT "6 Clicks" Mobility   Outcome Measure  Help needed turning from your back to your side while in a flat bed without using bedrails?: None Help needed moving from lying on your back to sitting on the side of a flat bed without using bedrails?: None Help needed moving to and from a bed to a chair (including a wheelchair)?: A Little Help needed standing up from a chair using your arms (e.g., wheelchair or bedside chair)?: A Little Help needed to walk in hospital room?: A Little Help needed climbing 3-5 steps with a railing? : A Lot 6 Click Score: 19    End of Session Equipment Utilized During Treatment: Oxygen Activity Tolerance: Patient tolerated treatment well     PT Visit Diagnosis: Other abnormalities of gait and mobility (R26.89)     Time: 4696-2952 PT Time Calculation (min) (ACUTE ONLY): 26 min  Charges:    $Therapeutic Exercise: 8-22 mins $Therapeutic Activity: 8-22 mins PT General Charges $$ ACUTE PT VISIT: 1 Visit                     Corinna Capra, PT, DPT  Acute Rehabilitation Secure chat is best for contact #[phone] office

## 2023-09-16 NOTE — Inpatient Diabetes Management (Signed)
Inpatient Diabetes Program Recommendations  AACE/ADA: New Consensus Statement on Inpatient Glycemic Control (2015)  Target Ranges:  Prepandial:   less than 140 mg/dL      Peak postprandial:   less than 180 mg/dL (1-2 hours)      Critically ill patients:  140 - 180 mg/dL   Lab Results  Component Value Date   GLUCAP 122 (H) 09/16/2023   HGBA1C 6.9 (H) 09/09/2023    Review of Glycemic Control  Diabetes history: DM 2 Outpatient Diabetes medications: Trulicity 3 mg Weekly, Jardiance 25 mg Daily, metformin 500 mg bid Current orders for Inpatient glycemic control:  Semglee 10 units qhs Novolog 0-9 units tid + hs  Last dose of PO prednisone 40 mg given yesterday am 12/29  Glucose trends should trends downward. Note Semglee 10 units added today  -   May consider reducing or d/cing semglee dose for tomorrow  Thanks,  Christena Deem RN, MSN, BC-ADM Inpatient Diabetes Coordinator Team Pager [phone] (8a-5p)

## 2023-09-17 ENCOUNTER — Encounter (HOSPITAL_COMMUNITY): Payer: Self-pay | Admitting: Pulmonary Disease

## 2023-09-17 LAB — GLUCOSE, CAPILLARY
Glucose-Capillary: 113 mg/dL — ABNORMAL HIGH (ref 70–99)
Glucose-Capillary: 138 mg/dL — ABNORMAL HIGH (ref 70–99)
Glucose-Capillary: 172 mg/dL — ABNORMAL HIGH (ref 70–99)
Glucose-Capillary: 173 mg/dL — ABNORMAL HIGH (ref 70–99)
Glucose-Capillary: 204 mg/dL — ABNORMAL HIGH (ref 70–99)
Glucose-Capillary: 263 mg/dL — ABNORMAL HIGH (ref 70–99)
Glucose-Capillary: 316 mg/dL — ABNORMAL HIGH (ref 70–99)

## 2023-09-17 LAB — BASIC METABOLIC PANEL
Anion gap: 7 (ref 5–15)
BUN: 34 mg/dL — ABNORMAL HIGH (ref 8–23)
CO2: 25 mmol/L (ref 22–32)
Calcium: 8.5 mg/dL — ABNORMAL LOW (ref 8.9–10.3)
Chloride: 104 mmol/L (ref 98–111)
Creatinine, Ser: 1.36 mg/dL — ABNORMAL HIGH (ref 0.61–1.24)
GFR, Estimated: 51 mL/min — ABNORMAL LOW (ref >60)
Glucose, Bld: 173 mg/dL — ABNORMAL HIGH (ref 70–99)
Potassium: 4.3 mmol/L (ref 3.5–5.1)
Sodium: 136 mmol/L (ref 135–145)

## 2023-09-17 LAB — CBC
HCT: 40.4 % (ref 39.0–52.0)
Hemoglobin: 13.6 g/dL (ref 13.0–17.0)
MCH: 32.6 pg (ref 26.0–34.0)
MCHC: 33.7 g/dL (ref 30.0–36.0)
MCV: 96.9 fL (ref 80.0–100.0)
Platelets: 358 10*3/uL (ref 150–400)
RBC: 4.17 MIL/uL — ABNORMAL LOW (ref 4.22–5.81)
RDW: 15.9 % — ABNORMAL HIGH (ref 11.5–15.5)
WBC: 15 10*3/uL — ABNORMAL HIGH (ref 4.0–10.5)
nRBC: 0 % (ref 0.0–0.2)

## 2023-09-17 NOTE — Progress Notes (Signed)
RT at bedside to place pt on HHFNC at 40L 80% to maintain oxygen saturation goals. Pt tolerating well at this time.

## 2023-09-17 NOTE — Progress Notes (Incomplete)
Pt was resting in chair with oxygen satting at 82% on 3L HFNC. Increased to [facility] and there was no improvement. Respiratory therapist and Dr. Joneen Roach notified. Pt was placed on 15L  resulting in oxygen sat increasing to 92% briefly and then decreasing to 87%. Pt was placed on non re breather and transferred to 2C13 due to increased need for oxygen.

## 2023-09-17 NOTE — Progress Notes (Signed)
 Pt was resting in chair with oxygen satting at 82% on 3L HFNC. Increased to [facility] and there was no improvement. Respiratory therapist and Dr. Joneen Roach notified. Pt was placed on 15L  resulting in oxygen sat increasing to 92% briefly and then decreasing to 87%. Pt was placed on non re breather and transferred to 2C13 due to increased need for oxygen.

## 2023-09-17 NOTE — Progress Notes (Signed)
 Physical Therapy Treatment Patient Details Name: Jonathan [name]. MRN: [id number] DOB: 1935/09/28 Today's Date: 09/17/2023   History of Present Illness 87 y.o. male presents to [facility] hospital on 09/08/2023 with chest pain and hemoptysis. CTA with LUL infiltrate suspicious for mass. PMH includes CAD, PVD, HTN, emphysema, DMII, HLD, CKD III, CHF.    PT Comments  Pt remains limited by impaired cardiopulmonary function and desaturation with mobility (details in following paragraph). Pt tends to desaturate after cessation of activity, with significant increase in time for recovery. PT and pt discuss the need to find a better gauge for patient's activity tolerance, as he feels well when mobilizing but his saturation declines quickly after stopping mobility. PT will continue to follow in an effort to improve activity tolerance and to aide the pt in achieving his goal of returning home to his spouse.    pt on 2L [place] upon PT arrival, saturating 89%. PT increases pt to 4L [place] for mobility with initial bout of ambulation. Pt desats down to 74% on 4L [place] with 50' walk. PT increases pt to 6L [place] with very slow recovery of sats to 77% over ~2 minute period. PT then increases supplemental oxygen  to 10L with recovery of sats to 92%. For 2nd and 3rd trial of ambulation the pt ambulates on 6L [place], sats remain in high 80s with activity but once seated and resting the pt desaturates to 75%. Pt is able to recover back to 90% with increased time while resting on 6L. Pt eventually weaned back to 3L [place] at rest, RN made aware.   If plan is discharge home, recommend the following: A little help with walking and/or transfers;A little help with bathing/dressing/bathroom;Assistance with cooking/housework;Assist for transportation;Help with stairs or ramp for entrance   Can travel by private vehicle        Equipment Recommendations  None recommended by PT    Recommendations for Other Services       Precautions / Restrictions  Precautions Precautions: Fall Precaution Comments: monitor O2 sats closely, progress very cautiously. Delay between cessation of activity and desaturation Restrictions Weight Bearing Restrictions Per Provider Order: No     Mobility  Bed Mobility                    Transfers Overall transfer level: Needs assistance Equipment used: None, Rollator (4 wheels) Transfers: Sit to/from Stand Sit to Stand: Supervision                Ambulation/Gait Ambulation/Gait assistance: Contact guard assist Gait Distance (Feet): 50 Feet (2 additional trials within the room walking 5' forward and backward for a total of 20' and then 30') Assistive device: Rollator (4 wheels), None Gait Pattern/deviations: Step-through pattern Gait velocity: reduced Gait velocity interpretation: <1.8 ft/sec, indicate of risk for recurrent falls   General Gait Details: slowed step-through gait, mild increase in lateral sway   Stairs             Wheelchair Mobility     Tilt Bed    Modified Rankin (Stroke Patients Only)       Balance Overall balance assessment: Needs assistance Sitting-balance support: No upper extremity supported, Feet supported Sitting balance-Leahy Scale: Good     Standing balance support: No upper extremity supported, During functional activity Standing balance-Leahy Scale: Fair                              Cognition Arousal:  Alert Behavior During Therapy: WFL for tasks assessed/performed Overall Cognitive Status: Within Functional Limits for tasks assessed                                          Exercises      General Comments General comments (skin integrity, edema, etc.): pt on 2L [place] upon PT arrival, saturating 89%. PT increases pt to 4L [place] for mobility with initial bout of ambulation. Pt desats down to 74% on 4L [place] with 50' walk. PT increases pt to 6L [place] with very slow recovery of sats to 77% over ~2 minute period. PT then  increases supplemental oxygen  to 10L with recovery of sats to 92%. For 2nd and 3rd trial of ambulation the pt ambulates on 6L [place], sats remain in high 80s with activity but once seated and resting the pt desaturates to 75%. Pt is able to recover back to 90% with increased time while resting on 6L. Pt eventually weaned back to 3L  at rest, RN made aware.      Pertinent Vitals/Pain Pain Assessment Pain Assessment: No/denies pain    Home Living Family/patient expects to be discharged to:: Private residence Living Arrangements: Spouse/significant other                      Prior Function            PT Goals (current goals can now be found in the care plan section) Acute Rehab PT Goals Patient Stated Goal: to return to independence Progress towards PT goals: Progressing toward goals (slow progress, very poor activity tolerance)    Frequency    Min 1X/week      PT Plan      Co-evaluation              AM-PAC PT 6 Clicks Mobility   Outcome Measure  Help needed turning from your back to your side while in a flat bed without using bedrails?: A Little Help needed moving from lying on your back to sitting on the side of a flat bed without using bedrails?: A Little Help needed moving to and from a bed to a chair (including a wheelchair)?: A Little Help needed standing up from a chair using your arms (e.g., wheelchair or bedside chair)?: A Little Help needed to walk in hospital room?: A Little Help needed climbing 3-5 steps with a railing? : A Lot 6 Click Score: 17    End of Session Equipment Utilized During Treatment: Oxygen  Activity Tolerance: Treatment limited secondary to medical complications (Comment) (desaturation with activity) Patient left: in chair;with call bell/phone within reach Nurse Communication: Mobility status PT Visit Diagnosis: Other abnormalities of gait and mobility (R26.89)     Time: 1410-1440 PT Time Calculation (min) (ACUTE ONLY): 30  min  Charges:    $Gait Training: 23-37 mins PT General Charges $$ ACUTE PT VISIT: 1 Visit                     Bernardino JINNY Ruth, PT, DPT Acute Rehabilitation Office [phone]    Bernardino JINNY Ruth 09/17/2023, 3:06 PM

## 2023-09-17 NOTE — Progress Notes (Signed)
     Referral previously received for Jonathan [name]. for goals of care discussion. Noted most recent palliative in-person assessment dated 09/11/2023 at which time it was recommended to continue with current plan. Request was left to follow from a distance/chart check for decompensation.   Chart reviewed for Recent provider notes, nurse notes, vitals, labs, and imaging and updates received from RN.   At this time patient appears stable/improved.  At last palliative note patient was on HHFNC 50 lpm, 100% FiO2. Today per pulmonary CCM notes oxygen  requirements are improving and down to 4 L on nasal cannula. Planned CT to follow-up LUL mass like consolidation (possibly outpatient) and bronch if no improvement.  Note today indicates stable for transfer out of the ICU and possible discharge tomorrow.    No plan for in person follow-up at this time. Please contact the palliative medicine provider on service for any new/urgent needs that require our assistance with this patient. In the meantime we will continue to shadow for clinical change.  Thank you for your referral and allowing PMT to assist in Jonathan GRANQUIST Jr.'s care.   Camellia Kays, NP Palliative Medicine Team Phone: [phone]  NO CHARGE

## 2023-09-17 NOTE — Plan of Care (Signed)
 Received care of pt in bed alert and oriented x4. Pt continues on HFNC of O2 at 4-6Lpm, to maintain oxygenation. Pt's 2000 blood sugar was 422 mg/dl, MD notified via Elink, given 10 units of Novolog  and 10 units of scheduled Semiglee. Blood sugars rechecked at 0000-316 mg/dl, 9799-$[id number]$$[id number]$ /dl and 9599- 138mg /dl. MD updated via Elink, no s/s of hyperglycemia. Writer explained to pt that his blood sugars were elevated because of the fig [place] he ate at 2000 and to not consume them because the are very naturally high in sugar I.e. the figs. Occasional nonproductive cough, no hemoptysis noted , pt denies any coughing up bloody sputum. Pt expressed the need to go home soon to take care of his wife, stated currently son who is from out of town is helping, but that he needs to go home to make sure his wife is receiving the right meds. Pt on the phone with family members attempting to coordinate care. Pt slept well after taking his melatonin at bedtime. Will continue to monitor.

## 2023-09-17 NOTE — Progress Notes (Addendum)
 NAME:  Dee Maday., MRN:  [id number], DOB:  07/27/1936, LOS: 8 ADMISSION DATE:  09/08/2023, CONSULTATION DATE: 09/09/2023 REFERRING MD: Rancour - EDP CHIEF COMPLAINT:  Hemoptysis   History of Present Illness:  87 year old man with PMHx significant for CAD s/p PCI x2 (12/2021), PVD s/p L SFA stent (2009), HTN, emphysema on nocturnal home O2 @ 2 L [place], DM-2, dyslipidemia, CKD-3b, and CHF (June 2024 EF 40-45%, G1DD) who is here today with chest discomfort and hemoptysis. He developed dry cough initially today and chest discomfort today without f/c/r, wheezing, DOE, SOB, LL edema, rash, URTI symptoms, N/V/D, or abd pain. When EMS arrived, he coughed fresh blood, large amount. This is never happened before. O2 was increased from 2 to 4 L [place]. He lost 50 Ib over 2 yrs intentionally and has good appetite. Denied night sweats, trauma, and previous hx of malignancy. Smoked for 30 yrs, 1 ppd, but quit 1978. Denied alcohol and illicit drug use.   Pertinent Medical History:  CAD s/p PCI x2 (12/2021), PVD s/p L SFA stent (2009), HTN, emphysema on nocturnal home O2 @ 2 L [place], DM-2, dyslipidemia, CKD-3b, and CHF (June 2024 EF 40-45%, G1DD)   Significant Hospital Events: Including procedures, antibiotic start and stop dates in addition to other pertinent events   12/22 - Admitted for hemoptysis.  CTA with no PE.  Noted rounded masslike opacity in the left upper lobe.  Interim History / Subjective:   O2 requirements are improving.  Down to 4 L nasal cannula  Objective:  Blood pressure (!) 96/55, pulse (!) 56, temperature 97.9 F (36.6 C), temperature source Oral, resp. rate (!) 8, height 6' 2 (1.88 m), weight 97.3 kg, SpO2 100%.    FiO2 (%):  [45 %] 45 %   Intake/Output Summary (Last 24 hours) at 09/17/2023 0837 Last data filed at 09/17/2023 0400 Gross per 24 hour  Intake 980 ml  Output 2100 ml  Net -1120 ml   Filed Weights   09/12/23 2300 09/13/23 0413 09/14/23 0500  Weight: 95 kg 95 kg 97.3 kg    Physical Examination: Blood pressure 132/79, pulse 76, temperature 97.9 F (36.6 C), temperature source Oral, resp. rate (!) 25, height 6' 2 (1.88 m), weight 97.3 kg, SpO2 95%. Gen:      No acute distress HEENT:  EOMI, sclera anicteric Neck:     No masses; no thyromegaly Lungs:    Clear to auscultation bilaterally; normal respiratory effort CV:         Regular rate and rhythm; no murmurs Abd:      + bowel sounds; soft, non-tender; no palpable masses, no distension Ext:    No edema; adequate peripheral perfusion Skin:      Warm and dry; no rash Neuro: alert and oriented x 3 Psych: normal mood and affect   Lab/imaging reviewed Labs are stable No new imaging today  Resolved Hospital Problem List:    Assessment & Plan:  Community-acquired pneumonia possible mass vs infiltrate w/ hemoptysis  Acute of chronic hypoxic resp failure due to hemoptysis Emphysema on nocturnal home O2 @ 2 L .  Wean down oxygen  as tolerated. Hemoptysis appears to have resolved Finished 7 days of ceftriaxone  and 5 days of azithromycin  Continue prednisone  at 40mg /day, bronchodilators Incentive spirometry, flutter valve Need CT follow up of LUL mass like consolidation and bronchoscopy if it is not improving  CAD s/p PCI x2 (12/2021) PVD s/p L SFA stent (2009) CHF Echo 02/2023, EF 40-45%, G1DD  HTN  HLD Telemetry monitoring Entresto , Toprol  Resume Plavix  as his hemoptysis has resolved Repeat Lasix  40 mg x 1  AKI on CKD stage 3a Monitor intake and output Avoid nephrotoxic agent Diuresis as above  T2DM SSI coverage  Depression Continue Lexapro   Goals of care DNR, DNI Palliative care is on board.  Stable for transfer out. May get discharged by tomorrow  Best Practice (right click and Reselect all SmartList Selections daily)   Diet/type: Regular consistency (see orders) DVT prophylaxis SCD Pressure ulcer(s): N/A GI prophylaxis: N/A Lines: N/A Foley:  N/A Code Status:  full  code Last date of multidisciplinary goals of care discussion [patient]  Lonna Coder MD [facility] See Amion for pager  If no response to pager , please call [phone] until 7pm After 7:00 pm call Elink  [phone] 09/17/2023, 9:10 AM

## 2023-09-17 NOTE — Progress Notes (Signed)
 Called by nursing, patient becoming more hypoxic now on 5 L satting 88%.  Patient changed to facemask, satting 96% on 15 L.  Patient without complaints, states he is comfortable.  Patient lungs are clear.  CXR ordered.  The patient denies recurrent hemoptysis, cough.  Patient transferred to progressive care.  Oxygen  delivery system later changed to high flow heated nasal cannula..  Goal 88-92%

## 2023-09-18 ENCOUNTER — Inpatient Hospital Stay (HOSPITAL_COMMUNITY): Payer: Medicare Other

## 2023-09-18 DIAGNOSIS — Z66 Do not resuscitate: Secondary | ICD-10-CM

## 2023-09-18 DIAGNOSIS — Z7189 Other specified counseling: Secondary | ICD-10-CM

## 2023-09-18 LAB — CBC
HCT: 44.5 % (ref 39.0–52.0)
Hemoglobin: 14.8 g/dL (ref 13.0–17.0)
MCH: 31.8 pg (ref 26.0–34.0)
MCHC: 33.3 g/dL (ref 30.0–36.0)
MCV: 95.7 fL (ref 80.0–100.0)
Platelets: 383 10*3/uL (ref 150–400)
RBC: 4.65 MIL/uL (ref 4.22–5.81)
RDW: 16.3 % — ABNORMAL HIGH (ref 11.5–15.5)
WBC: 16.5 10*3/uL — ABNORMAL HIGH (ref 4.0–10.5)
nRBC: 0 % (ref 0.0–0.2)

## 2023-09-18 LAB — BASIC METABOLIC PANEL
Anion gap: 8 (ref 5–15)
BUN: 33 mg/dL — ABNORMAL HIGH (ref 8–23)
CO2: 21 mmol/L — ABNORMAL LOW (ref 22–32)
Calcium: 8.4 mg/dL — ABNORMAL LOW (ref 8.9–10.3)
Chloride: 105 mmol/L (ref 98–111)
Creatinine, Ser: 1.33 mg/dL — ABNORMAL HIGH (ref 0.61–1.24)
GFR, Estimated: 52 mL/min — ABNORMAL LOW (ref >60)
Glucose, Bld: 122 mg/dL — ABNORMAL HIGH (ref 70–99)
Potassium: 4.5 mmol/L (ref 3.5–5.1)
Sodium: 134 mmol/L — ABNORMAL LOW (ref 135–145)

## 2023-09-18 LAB — GLUCOSE, CAPILLARY
Glucose-Capillary: 112 mg/dL — ABNORMAL HIGH (ref 70–99)
Glucose-Capillary: 168 mg/dL — ABNORMAL HIGH (ref 70–99)
Glucose-Capillary: 200 mg/dL — ABNORMAL HIGH (ref 70–99)
Glucose-Capillary: 217 mg/dL — ABNORMAL HIGH (ref 70–99)

## 2023-09-18 LAB — MRSA NEXT GEN BY PCR, NASAL: MRSA by PCR Next Gen: NOT DETECTED

## 2023-09-18 MED ORDER — BUDESONIDE 0.5 MG/2ML IN SUSP
0.5000 mg | Freq: Two times a day (BID) | RESPIRATORY_TRACT | Status: DC
  Administered 2023-09-18 – 2023-09-23 (×11): 0.5 mg via RESPIRATORY_TRACT
  Filled 2023-09-18 (×11): qty 2

## 2023-09-18 MED ORDER — ARFORMOTEROL TARTRATE 15 MCG/2ML IN NEBU
15.0000 ug | INHALATION_SOLUTION | Freq: Two times a day (BID) | RESPIRATORY_TRACT | Status: DC
  Administered 2023-09-18 – 2023-09-23 (×11): 15 ug via RESPIRATORY_TRACT
  Filled 2023-09-18 (×11): qty 2

## 2023-09-18 MED ORDER — SODIUM CHLORIDE 3 % IN NEBU
4.0000 mL | INHALATION_SOLUTION | Freq: Two times a day (BID) | RESPIRATORY_TRACT | Status: AC
  Administered 2023-09-18 – 2023-09-21 (×6): 4 mL via RESPIRATORY_TRACT
  Filled 2023-09-18 (×7): qty 4

## 2023-09-18 MED ORDER — REVEFENACIN 175 MCG/3ML IN SOLN
175.0000 ug | Freq: Every day | RESPIRATORY_TRACT | Status: DC
  Administered 2023-09-18 – 2023-09-23 (×6): 175 ug via RESPIRATORY_TRACT
  Filled 2023-09-18 (×6): qty 3

## 2023-09-18 NOTE — Progress Notes (Signed)
 Daily Progress Note   Patient Name: Jonathan [name].       Date: 09/19/2023 DOB: 23-Mar-1936  Age: 88 y.o. MRN#: [id number] Attending Physician: Jonathan Elgin LABOR, MD Primary Care Physician: Jonathan Aloysius FORBES, MD Admit Date: 09/08/2023 Length of Stay: 10 days  Reason for Consultation/Follow-up: Establishing goals of care  HPI/Patient Profile:  88 y.o. male  with past medical history of CAD s/p PCI x 2 in 2023, PVD, HTN, COPD, DM, HLD admitted on 09/08/2023 with hemoptysis. Admitted for pneumonia. Currently requiring heated high flow oxygen . Palliative consulted for goals of care.    Per previous notes, chart after initial consult dated 09/11/2023.  The patient seems to have had some improvement and was transferred out of the ICU on 4 L of nasal cannula.  Subjective:   Subjective: Chart Reviewed. Updates received. Patient Assessed. Created space and opportunity for patient  and family to explore thoughts and feelings regarding current medical situation.  Today's Discussion: Review of the chart prior to seeing the patient indicates that the patient was on the floor but had an episode of dyspnea and respiratory distress for which he was placed back on high flow nasal cannula because of desaturation.  Chest x-ray showed increased left lower lobe consolidation area.  He was moved to stepdown onto central and the plan is for continued weaning of oxygen  as tolerated, prednisone , BD/ICS, consider bronc if no improvement.  Plans for outpatient follow-up CT in 4 to 6 weeks.    Today saw the patient at the bedside.  He was sleeping but easily awoken with voice and light touch.  After a moment to get his bearings he was conversational.  I asked him how he is feeling and he states he is feeling better today than he was over the past several days.  I noted that his oxygen  is on 30 L/min.  We talked about the previous evening which she states was pretty bad, he got very short of breath and needed more oxygen .  Overall  he is breathing much better today.    We reviewed the plan for continued care and trying to wean the oxygen .  We reviewed his goals of care which remain consistent (DNR/DNI, sister is HCPOA, continue current interventions with a goal of returning, to care for his wife).  I provided emotional and general support through therapeutic listening, empathy, sharing of stories, and other techniques. I answered all questions and addressed all concerns to the best of my ability.   Review of Systems  Respiratory:  Positive for shortness of breath (Significantly improved).   Gastrointestinal:  Negative for abdominal pain, nausea and vomiting.    Objective:   Vital Signs:  BP (!) 153/104 (BP Location: Left Arm)   Pulse 63   Temp 97.7 F (36.5 C) (Oral)   Resp 16   Ht 6' 2 (1.88 m)   Wt 99.5 kg   SpO2 99%   BMI 28.16 kg/m   Physical Exam Vitals and nursing note reviewed.  Constitutional:      General: He is sleeping. He is not in acute distress. Cardiovascular:     Rate and Rhythm: Normal rate.  Pulmonary:     Effort: Pulmonary effort is normal. No respiratory distress.     Breath sounds: No wheezing or rhonchi.  Abdominal:     General: Abdomen is flat. There is no distension.     Palpations: Abdomen is soft.  Skin:    General: Skin is warm and dry.  Neurological:     General: No focal deficit present.     Mental Status: He is easily aroused.  Psychiatric:        Mood and Affect: Mood normal.        Behavior: Behavior normal.     Palliative Assessment/Data: 40-50%    Existing Vynca/ACP Documentation: None  Assessment & Plan:   Impression: Present on Admission:  Hemoptysis  SUMMARY OF RECOMMENDATIONS   Remain DNR-limited Continue current interventions/full scope of care Palliative medicine will check in early next week Please call us  for any significant clinical change or new palliative needs  Symptom Management:  Per primary team PMT is available to assist as  needed  Code Status: DNR-limited  Prognosis: Unable to determine  Discharge Planning: To Be Determined  Discussed with: Patient, medical team, nursing team  Thank you for allowing us  to participate in the care of Jonathan E Leavitt Jr. PMT will continue to support holistically.  Time Total: 25 min  Detailed review of medical records (labs, imaging, vital signs), medically appropriate exam, discussed with treatment team, counseling and education to patient, family, & staff, documenting clinical information, medication management, coordination of care  Jonathan Kays, NP Palliative Medicine Team  Team Phone # [phone] (Nights/Weekends)  05/16/2021, 8:17 AM

## 2023-09-18 NOTE — Plan of Care (Signed)

## 2023-09-18 NOTE — Progress Notes (Signed)
 NAME:  Jonathan Schuld., MRN:  [id number], DOB:  Aug 04, 1936, LOS: 9 ADMISSION DATE:  09/08/2023, CONSULTATION DATE: 09/09/2023 REFERRING MD: Rancour - EDP CHIEF COMPLAINT:  Hemoptysis   History of Present Illness:  88 year old man with PMHx significant for CAD s/p PCI x2 (12/2021), PVD s/p L SFA stent (2009), HTN, emphysema on nocturnal home O2 @ 2 L [place], DM-2, dyslipidemia, CKD-3b, and CHF (June 2024 EF 40-45%, G1DD) who is here today with chest discomfort and hemoptysis. He developed dry cough initially today and chest discomfort today without f/c/r, wheezing, DOE, SOB, LL edema, rash, URTI symptoms, N/V/D, or abd pain. When EMS arrived, he coughed fresh blood, large amount. This is never happened before. O2 was increased from 2 to 4 L [place]. He lost 50 Ib over 2 yrs intentionally and has good appetite. Denied night sweats, trauma, and previous hx of malignancy. Smoked for 30 yrs, 1 ppd, but quit 1978. Denied alcohol and illicit drug use.   Pertinent Medical History:  CAD s/p PCI x2 (12/2021), PVD s/p L SFA stent (2009), HTN, emphysema on nocturnal home O2 @ 2 L [place], DM-2, dyslipidemia, CKD-3b, and CHF (June 2024 EF 40-45%, G1DD)   Significant Hospital Events: Including procedures, antibiotic start and stop dates in addition to other pertinent events   12/22 - Admitted for hemoptysis.  CTA with no PE.  Noted rounded masslike opacity in the left upper lobe. 12/31 O2 requirements had improved. Then in evening hrs required 40 liters and 80%  Interim History / Subjective:  No significant distress.  Did not sleep well last night.  Objective:  Blood pressure 113/67, pulse (!) 56, temperature (!) 97.5 F (36.4 C), temperature source Oral, resp. rate 18, height 6' 2 (1.88 m), weight 97.1 kg, SpO2 93%.    FiO2 (%):  [77 %-100 %] 100 %   Intake/Output Summary (Last 24 hours) at 09/18/2023 0719 Last data filed at 09/17/2023 2030 Gross per 24 hour  Intake --  Output 1075 ml  Net -1075 ml   Filed  Weights   09/13/23 0413 09/14/23 0500 09/18/23 0013  Weight: 95 kg 97.3 kg 97.1 kg   Physical Examination: General This is a very pleasant 88 year old male patient sitting up in bed he is in no acute distress today however he is on 100% heated high flow with 40 L/min HEENT normocephalic atraumatic Pulmonary decreased left base.  Point-of-care ultrasound evaluation of left chest: No pleural effusion see attached picture Cardiac regular rate and rhythm Abdomen soft nontender Extremities warm dry Neuro intact  Resolved Hospital Problem List:  Hemoptysis   Assessment & Plan:  Acute of chronic hypoxic resp failure due to Community-acquired pneumonia +/-  mass superimposed on Emphysema on nocturnal home O2 @ 2 L .  -increased O2 demand yesterday evening, CXR w/ worsening aeration/inc'd consolidation  LLL, -completed abx  Plan Cont to wean O2 Continue prednisone  at 40mg /day Added scheduled BD/ICS ordered as PRN Add HT NaCl nebs Incentive spirometry, flutter valve Need CT follow up of LUL mass like consolidation and bronchoscopy if it is not improving, plan for this follow-up imaging in 4 to 6 weeks  CAD s/p PCI x2 (12/2021) PVD s/p L SFA stent (2009) CHF Echo 02/2023, EF 40-45%, G1DD  HTN HLD Plan Telemetry monitoring Entresto , Toprol  Cont plavix   AKI on CKD stage 3a Scr stable, -9.4 liters Plan Daily assessment for diuretics Strict I&O Am chem  Renal dose meds  T2DM Plan SSI coverage  Depression Plan Continue Lexapro   Goals of care Palliative care is on board. Plan DNR/DNI   Best Practice (right click and Reselect all SmartList Selections daily)   Diet/type: Regular consistency (see orders) DVT prophylaxis SCD Pressure ulcer(s): N/A GI prophylaxis: N/A Lines: N/A Foley:  N/A Code Status:  full code Last date of multidisciplinary goals of care discussion [patient]

## 2023-09-19 ENCOUNTER — Inpatient Hospital Stay (HOSPITAL_COMMUNITY): Payer: Medicare Other

## 2023-09-19 DIAGNOSIS — N1832 Chronic kidney disease, stage 3b: Secondary | ICD-10-CM

## 2023-09-19 DIAGNOSIS — J9621 Acute and chronic respiratory failure with hypoxia: Secondary | ICD-10-CM

## 2023-09-19 DIAGNOSIS — R042 Hemoptysis: Secondary | ICD-10-CM | POA: Diagnosis not present

## 2023-09-19 LAB — GLUCOSE, CAPILLARY
Glucose-Capillary: 110 mg/dL — ABNORMAL HIGH (ref 70–99)
Glucose-Capillary: 161 mg/dL — ABNORMAL HIGH (ref 70–99)
Glucose-Capillary: 177 mg/dL — ABNORMAL HIGH (ref 70–99)
Glucose-Capillary: 238 mg/dL — ABNORMAL HIGH (ref 70–99)

## 2023-09-19 NOTE — Progress Notes (Signed)
 Daily Progress Note   Patient Name: Jonathan [name].       Date: 09/19/2023 DOB: 11/11/1935  Age: 88 y.o. MRN#: [id number] Attending Physician: Briana Elgin LABOR, MD Primary Care Physician: [name], MD Admit Date: 09/08/2023 Length of Stay: 10 days  Reason for Consultation/Follow-up: Establishing goals of care  HPI/Patient Profile:  88 y.o. male  with past medical history of CAD s/p PCI x 2 in 2023, PVD, HTN, COPD, DM, HLD admitted on 09/08/2023 with hemoptysis. Admitted for pneumonia. Currently requiring heated high flow oxygen . Palliative consulted for goals of care.    Per previous notes, chart after initial consult dated 09/11/2023.  The patient seems to have had some improvement and was transferred out of the ICU on 4 L of nasal cannula.  Subjective:   Subjective: Chart Reviewed. Updates received. Patient Assessed. Created space and opportunity for patient  and family to explore thoughts and feelings regarding current medical situation.  Today's Discussion: Review of the chart prior to seeing the patient indicates that the patient has been stable since moving to stepdown.  It appears the plan remains for continued weaning of oxygen  as tolerated, prednisone , BD/ICS, consider bronc if no improvement.  Plans for outpatient follow-up CT in 4 to 6 weeks.    Today saw the patient at the bedside.  He was awake lying in the bed.  I asked him how he is feeling and he states he is feeling okay today.  I noted that he is on 55 L of high flow nasal cannula (slight increase from 50 L yesterday).  However he remains improved compared to his ICU time.  Today he denies dyspnea.  He also denies pain, nausea, vomiting.  His appetite has been good.  We reviewed the plan for continued care and trying to wean the oxygen .  We reviewed his goals of care which remain consistent (DNR/DNI, sister is HCPOA, continue current interventions with a goal of returning, to care for his wife).  He agreed with all of  this.  I provided emotional and general support through therapeutic listening, empathy, sharing of stories, and other techniques. I answered all questions and addressed all concerns to the best of my ability.   Review of Systems  Respiratory:  Positive for shortness of breath (Significantly improved).   Gastrointestinal:  Negative for abdominal pain, nausea and vomiting.    Objective:   Vital Signs:  BP (!) 153/103   Pulse (!) 55   Temp 97.7 F (36.5 C) (Oral)   Resp 16   Ht 6' 2 (1.88 m)   Wt 99.5 kg   SpO2 99%   BMI 28.16 kg/m   Physical Exam Vitals and nursing note reviewed.  Constitutional:      General: He is sleeping. He is not in acute distress. Cardiovascular:     Rate and Rhythm: Normal rate.  Pulmonary:     Effort: Pulmonary effort is normal. No respiratory distress.     Breath sounds: No wheezing or rhonchi.  Abdominal:     General: Abdomen is flat. There is no distension.     Palpations: Abdomen is soft.  Skin:    General: Skin is warm and dry.  Neurological:     General: No focal deficit present.     Mental Status: He is easily aroused.  Psychiatric:        Mood and Affect: Mood normal.        Behavior: Behavior normal.     Palliative Assessment/Data:  40-50%    Existing Vynca/ACP Documentation: None  Assessment & Plan:   Impression: Present on Admission:  Hemoptysis  SUMMARY OF RECOMMENDATIONS   Remain DNR-limited Continue current interventions/full scope of care The patient remains high risk for decompensation Goals are clear at this time Palliative medicine will shadow for any significant clinical change Please call us  for any significant clinical change or new palliative needs  Symptom Management:  Per primary team PMT is available to assist as needed  Code Status: DNR-limited  Prognosis: Unable to determine  Discharge Planning: To Be Determined  Discussed with: Patient, medical team, nursing team  Thank you for allowing  us  to participate in the care of Jonathan [name]. PMT will continue to support holistically.  Time Total: 25 min  Detailed review of medical records (labs, imaging, vital signs), medically appropriate exam, discussed with treatment team, counseling and education to patient, family, & staff, documenting clinical information, medication management, coordination of care  Camellia Kays, NP Palliative Medicine Team  Team Phone # [phone] (Nights/Weekends)  05/16/2021, 8:17 AM

## 2023-09-19 NOTE — Hospital Course (Addendum)
 Jonathan [name]. is a 88 y.o. male with a history of CAD, PVD, hypertension, emphysema, chronic hypoxia on 2 L of oxygen , diabetes mellitus type 2, hyperlipidemia, CKD stage IIIb, CHF.  Patient presented secondary to chest discomfort and hemoptysis with associated respiratory failure.  Patient observed and managed in ICU by PCCM.  Patient also started empirically on antibiotics for presumed pneumonia.  Patient continued to room require significant rate of oxygen  supplementation to maintain oxygen  saturations in the 90s.  Patient is completed antibiotic course with continued/persistent severe hypoxia.  Steroids started.  Patient weaned to 3 L/min. Plan for outpatient bronchoscopy.

## 2023-09-19 NOTE — Care Management Important Message (Signed)
 Important Message  Patient Details  Name: Jonathan [name]. MRN: [id number] Date of Birth: May 14, 1936   Important Message Given:  Yes - Medicare IM     Dorena Bodo 09/19/2023, 3:14 PM

## 2023-09-19 NOTE — Progress Notes (Signed)
 Physical Therapy Treatment Patient Details Name: Jonathan [name]. MRN: [id number] DOB: 06/14/1936 Today's Date: 09/19/2023   History of Present Illness 88 y.o. male presents to [facility] on 09/08/2023 with chest pain and hemoptysis. CTA with LUL infiltrate suspicious for mass. Evening of 12/31 pt breathing requirements increased to 55L HHFNC and 85% FiO2. PMH includes CAD, PVD, HTN, emphysema, DMII, HLD, CKD III, CHF.    PT Comments  Pt is slowly progressing towards goals. Pt is limited due to respiratory status and has had continuing increased needs for O2.  Pt is supervision for O2 saturations and reminders for purse lipped breathing and pacing activities. Pt is Mod I with RW. Due to pt current functional status, home set up and available assistance at home recommending skilled physical therapy services 3x/week in order to address strength, balance and functional mobility to decrease risk for falls, injury and re-hospitalization.      If plan is discharge home, recommend the following: A little help with walking and/or transfers;Assistance with cooking/housework;Assist for transportation;Help with stairs or ramp for entrance     Equipment Recommendations  None recommended by PT       Precautions / Restrictions Precautions Precautions: Fall Precaution Comments: monitor O2 sats closely, progress very cautiously. Delay between cessation of activity and desaturation Restrictions Weight Bearing Restrictions Per Provider Order: No     Mobility  Bed Mobility Overal bed mobility: Modified Independent             General bed mobility comments: pt performed supine<>sitting at Mod I without use of rails.    Transfers Overall transfer level: Needs assistance Equipment used: Rolling walker (2 wheels) Transfers: Sit to/from Stand Sit to Stand: Supervision           General transfer comment: supervision due to respiratory status. Pt requires frequent reminders for purse lipped  breathing and to pace activities.    Ambulation/Gait Ambulation/Gait assistance: Supervision Gait Distance (Feet): 20 Feet Assistive device: Rolling walker (2 wheels) Gait Pattern/deviations: Step-through pattern, WFL(Within Functional Limits) Gait velocity: reduced Gait velocity interpretation: 1.31 - 2.62 ft/sec, indicative of limited community ambulator (limited due to respiratory status not speed)   General Gait Details: Pt moves quickly; requires frequent reminders to pace activities and for purse lipped breathing. O2 sats down to 81% on 55L O2 and 95% FiO2 HHFNC. Pt sat EOB and O2 sats back up to 91% ~ 1 min       Balance Overall balance assessment: Modified Independent, No apparent balance deficits (not formally assessed) Sitting-balance support: No upper extremity supported, Feet supported Sitting balance-Leahy Scale: Normal     Standing balance support: No upper extremity supported, During functional activity, Bilateral upper extremity supported Standing balance-Leahy Scale: Good Standing balance comment: no LOB        Cognition Arousal: Alert Behavior During Therapy: WFL for tasks assessed/performed Overall Cognitive Status: Within Functional Limits for tasks assessed           General Comments General comments (skin integrity, edema, etc.): sister arrived during session. Pt wants to move quickly and requires frequent reminders to slow down and pace activities while performing purse lipped breathing to prevent drops in O2. Due to continuing increase in o2 needs throughout the day and poor pleth reading limited session today.      Pertinent Vitals/Pain Pain Assessment Pain Assessment: No/denies pain     PT Goals (current goals can now be found in the care plan section) Acute Rehab PT Goals Patient Stated Goal:  to return to independence PT Goal Formulation: With patient Time For Goal Achievement: 09/24/23 Potential to Achieve Goals: Fair Additional  Goals Additional Goal #1: Pt will score >19/24 on the DGI to indicate a reduced risk for falls Additional Goal #2: Pt will report 1/4 DOE or less when ambulating to demonstrate improved activity tolerance Progress towards PT goals: Progressing toward goals    Frequency    Min 1X/week      PT Plan  Continue with current POC        AM-PAC PT 6 Clicks Mobility   Outcome Measure  Help needed turning from your back to your side while in a flat bed without using bedrails?: None Help needed moving from lying on your back to sitting on the side of a flat bed without using bedrails?: None Help needed moving to and from a bed to a chair (including a wheelchair)?: A Little Help needed standing up from a chair using your arms (e.g., wheelchair or bedside chair)?: A Little Help needed to walk in hospital room?: A Little Help needed climbing 3-5 steps with a railing? : A Lot 6 Click Score: 19    End of Session Equipment Utilized During Treatment: Oxygen  Activity Tolerance: Treatment limited secondary to medical complications (Comment);Patient tolerated treatment well (O2 sats) Patient left: in bed;with call bell/phone within reach;with family/visitor present Nurse Communication: Mobility status PT Visit Diagnosis: Other abnormalities of gait and mobility (R26.89)     Time: 1400-1415 PT Time Calculation (min) (ACUTE ONLY): 15 min  Charges:    $Therapeutic Activity: 8-22 mins PT General Charges $$ ACUTE PT VISIT: 1 Visit                    Dorothyann Maier, DPT, CLT  Acute Rehabilitation Services Office: [phone] (Secure chat preferred)    [name] 09/19/2023, 2:28 PM

## 2023-09-19 NOTE — Progress Notes (Addendum)
 PROGRESS NOTE    Jonathan [name] Jonathan [name].  FMW:[id number] DOB: 07/21/1936 DOA: 09/08/2023 PCP: [name], MD   Brief Narrative: Jonathan [name]. is a 88 y.o. male with a history of CAD, PVD, hypertension, emphysema, chronic hypoxia on 2 L of oxygen , diabetes mellitus type 2, hyperlipidemia, CKD stage IIIb, CHF.  Patient presented secondary to chest discomfort and hemoptysis with associated respiratory failure.  Patient observed and managed in ICU by PCCM.  Patient also started empirically on antibiotics for presumed pneumonia.  Patient continued to room require significant rate of oxygen  supplementation to maintain oxygen  saturations in the 90s.  Patient is completed antibiotic course with continued/persistent severe hypoxia.  Steroids started.   Assessment and Plan:  Acute on chronic respiratory failure with hypoxia Secondary to hemoptysis and complicated by possible in addition to left upper lobe consolidation concerning for possible mass.  Further complicated by underlying emphysema.  Patient requiring oxygen  supplementation via heated high flow nasal cannula with rates up to 50 L/min with FiO2 of 100%.  He has been managed on breathing treatments, steroids, hypertonic saline per pulmonology recommendations. -Wean to baseline 2 L/min as able -Pulmonology recommendations: Prednisone , hypertonic saline  Community acquired pneumonia Presumed diagnosis.  Patient treated empirically with ceftriaxone  and azithromycin  with completion of 5 days of antibiotics.  Hemoptysis Presumed secondary to pneumonia with coughing and complicated by aspirin  and Plavix  use.  CT chest also noted a masslike consolidation of unknown etiology.  Hemoptysis is resolved.  Hemoglobin has been stable.  Emphysema Noted and present on admission.  Patient is on nocturnal oxygen  at 2 L/min -Continue Yupelri , Brovana , Pulmicort   Left upper lobe lung consolidation Masslike consolidation noted on CT angiogram of the  chest from 09/08/2023.  Pulmonology aware and plan for repeat follow-up imaging in 4 to 6 weeks.  CAD PAD History of PCI in 2023 and is status post left SFA stent 2009.  Patient is on aspirin  and Plavix  as an outpatient.  Aspirin  and Plavix  were held on admission secondary to submassive hemoptysis.  Plavix  restarted. -Continue Plavix  -Continue simvastatin   Chronic combined systolic and diastolic heart failure Stable.  No evidence of acute heart failure at this time.  Likely not contributing to respiratory failure. -Continue Toprol  XL, Entresto   CKD stage IIIb Baseline creatinine is about 1.2-1.4.  Peak creatinine this admission of 1.55 on admission.  Creatinine improved toward baseline.  Primary hypertension Normotensive to hypertensive during this admission. -Continue Entresto  and metoprolol  succinate  Leukocytosis No associated fevers.  Likely secondary to steroids.   DVT prophylaxis: Lovenox  Code Status:   Code Status: Limited: Do not attempt resuscitation (DNR) -DNR-LIMITED -Do Not Intubate/DNI  Family Communication: None at bedside Disposition Plan: Discharge not likely for several days while attempting to an oxygen    Consultants:  Pulmonology  Procedures:  None  Antimicrobials: Ceftriaxone  Azithromycin    Subjective: Patient reports feeling well.  No dyspnea or chest pain.  Hoping to discharge soon.  Objective: BP (!) 153/103   Pulse (!) 55   Temp 97.7 F (36.5 C) (Oral)   Resp 16   Ht 6' 2 (1.88 m)   Wt 99.5 kg   SpO2 99%   BMI 28.16 kg/m   Examination:  General exam: Appears calm and comfortable Respiratory system: Clear to auscultation. Respiratory effort normal. Cardiovascular system: S1 & S2 heard, RRR. No murmurs. Gastrointestinal system: Abdomen is nondistended, soft and nontender. Normal bowel sounds heard. Central nervous system: Alert and oriented. No focal neurological deficits. Musculoskeletal: No  edema. No calf tenderness Psychiatry:  Judgement and insight appear normal. Mood & affect appropriate.    Data Reviewed: I have personally reviewed following labs and imaging studies  CBC Lab Results  Component Value Date   WBC 16.5 (H) 09/18/2023   RBC 4.65 09/18/2023   HGB 14.8 09/18/2023   HCT 44.5 09/18/2023   MCV 95.7 09/18/2023   MCH 31.8 09/18/2023   PLT 383 09/18/2023   MCHC 33.3 09/18/2023   RDW 16.3 (H) 09/18/2023   LYMPHSABS 4.1 (H) 09/08/2023   MONOABS 1.4 (H) 09/08/2023   EOSABS 0.3 09/08/2023   BASOSABS 0.1 09/08/2023     Last metabolic panel Lab Results  Component Value Date   NA 134 (L) 09/18/2023   K 4.5 09/18/2023   CL 105 09/18/2023   CO2 21 (L) 09/18/2023   BUN 33 (H) 09/18/2023   CREATININE 1.33 (H) 09/18/2023   GLUCOSE 122 (H) 09/18/2023   GFRNONAA 52 (L) 09/18/2023   GFRAA 48 (L) 11/19/2019   CALCIUM  8.4 (L) 09/18/2023   PHOS 3.4 09/10/2023   PROT 7.2 09/08/2023   ALBUMIN 3.3 (L) 09/08/2023   BILITOT 0.3 09/08/2023   ALKPHOS 91 09/08/2023   AST 20 09/08/2023   ALT 19 09/08/2023   ANIONGAP 8 09/18/2023    GFR: Estimated Creatinine Clearance: 51.2 mL/min (A) (by C-G formula based on SCr of 1.33 mg/dL (H)).  Recent Results (from the past 240 hours)  MRSA Next Gen by PCR, Nasal     Status: None   Collection Time: 09/18/23 12:01 AM   Specimen: Nasal Mucosa; Nasal Swab  Result Value Ref Range Status   MRSA by PCR Next Gen NOT DETECTED NOT DETECTED Final    Comment: (NOTE) The GeneXpert MRSA Assay (FDA approved for NASAL specimens only), is one component of a comprehensive MRSA colonization surveillance program. It is not intended to diagnose MRSA infection nor to guide or monitor treatment for MRSA infections. Test performance is not FDA approved in patients less than 2 years old. Performed at [facility], [address], [place], [place] [address]       Radiology Studies: DG CHEST PORT 1 VIEW Result Date: 09/18/2023 CLINICAL DATA:  Hypoxia. EXAM: PORTABLE CHEST  1 VIEW COMPARISON:  Chest radiograph 09/16/2023. FINDINGS: Stable cardiomediastinal silhouette. There is a moderate layering pleural effusion with associated atelectasis/airspace disease on the left, similar or slightly increased compared to prior exam. There is mild right basilar atelectasis, increased from prior exam. No pneumothorax. IMPRESSION: 1. Moderate layering pleural effusion with associated atelectasis/airspace disease on the left, similar or slightly increased compared to prior exam. 2. Mild right basilar atelectasis, increased from prior exam. Electronically Signed   By: Norman Hopper M.D.   On: 09/18/2023 10:01      LOS: 10 days    Elgin Lam, MD [facility] 09/19/2023, 10:18 AM   If 7PM-7AM, please contact night-coverage [email or web address]

## 2023-09-19 NOTE — Progress Notes (Signed)
 Occupational Therapy Treatment Patient Details Name: Jonathan [name]. MRN: [id number] DOB: 01-07-1936 Today's Date: 09/19/2023   History of present illness 88 y.o. male presents to [facility] hospital on 09/08/2023 with chest pain and hemoptysis. CTA with LUL infiltrate suspicious for mass. PMH includes CAD, PVD, HTN, emphysema, DMII, HLD, CKD III, CHF.   OT comments  Pt on HHFN with RN working to get O2 saturations into the 90s, deferred mobilizing. Pt reports having lost his theraband issued last session, provided new blue (level 4) theraband and reviewed UB exercises. Educated pt to wait to perform more reps until breathing improves, verbalized understanding. Will continue efforts.       If plan is discharge home, recommend the following:  Assistance with cooking/housework;Assist for transportation;Help with stairs or ramp for entrance   Equipment Recommendations  Tub/shower seat    Recommendations for Other Services      Precautions / Restrictions Precautions Precautions: Fall Precaution Comments: monitor O2 sats closely, progress very cautiously. Delay between cessation of activity and desaturation Restrictions Weight Bearing Restrictions Per Provider Order: No       Mobility Bed Mobility Overal bed mobility: Modified Independent                  Transfers                   General transfer comment: deferred OOB to chair due to pt with tenuous respiratory status     Balance                                           ADL either performed or assessed with clinical judgement   ADL                                              Extremity/Trunk Assessment              Vision       Perception     Praxis      Cognition Arousal: Alert Behavior During Therapy: WFL for tasks assessed/performed Overall Cognitive Status: Within Functional Limits for tasks assessed                                           Exercises Exercises: General Upper Extremity General Exercises - Upper Extremity Shoulder Flexion: Strengthening, Both, Theraband, 5 reps Theraband Level (Shoulder Flexion): Level 4 (Blue) Shoulder Horizontal ABduction: Strengthening, Both, 5 reps, Supine, Theraband Theraband Level (Shoulder Horizontal Abduction): Level 4 (Blue)    Shoulder Instructions       General Comments      Pertinent Vitals/ Pain       Pain Assessment Pain Assessment: No/denies pain  Home Living                                          Prior Functioning/Environment              Frequency  Min 1X/week        Progress Toward Goals  OT Goals(current goals can now be  found in the care plan section)  Progress towards OT goals: Not progressing toward goals - comment  Acute Rehab OT Goals OT Goal Formulation: With patient Time For Goal Achievement: 09/24/23 Potential to Achieve Goals: Good  Plan      Co-evaluation                 AM-PAC OT 6 Clicks Daily Activity     Outcome Measure   Help from another person eating meals?: None Help from another person taking care of personal grooming?: A Little Help from another person toileting, which includes using toliet, bedpan, or urinal?: A Little Help from another person bathing (including washing, rinsing, drying)?: A Little Help from another person to put on and taking off regular upper body clothing?: A Little Help from another person to put on and taking off regular lower body clothing?: A Little 6 Click Score: 19    End of Session        Activity Tolerance Treatment limited secondary to medical complications (Comment) (O2 desaturations)   Patient Left in bed;with call bell/phone within reach   Nurse Communication          Time: 9086-9068 OT Time Calculation (min): 18 min  Charges: OT General Charges $OT Visit: 1 Visit OT Treatments $Therapeutic Activity: 8-22 mins [name], OTR/L Acute  Rehabilitation Services Office: [phone]   Kennth Mliss Helling 09/19/2023, 10:25 AM

## 2023-09-19 NOTE — Plan of Care (Signed)
  Problem: Nutrition: Goal: Adequate nutrition will be maintained Outcome: Progressing   Problem: Coping: Goal: Level of anxiety will decrease Outcome: Progressing   Problem: Pain Management: Goal: General experience of comfort will improve Outcome: Progressing   Problem: Safety: Goal: Ability to remain free from injury will improve Outcome: Progressing   Problem: Skin Integrity: Goal: Risk for impaired skin integrity will decrease Outcome: Progressing

## 2023-09-19 NOTE — Progress Notes (Signed)
 NAME:  Jonathan [name]., MRN:  [id number], DOB:  05-04-1936, LOS: 10 ADMISSION DATE:  09/08/2023, CONSULTATION DATE: 09/09/2023 REFERRING MD: Rancour - EDP CHIEF COMPLAINT:  Hemoptysis   History of Present Illness:  88 year old man with PMHx significant for CAD s/p PCI x2 (12/2021), PVD s/p L SFA stent (2009), HTN, emphysema on nocturnal home O2 @ 2 L [place], DM-2, dyslipidemia, CKD-3b, and CHF (June 2024 EF 40-45%, G1DD) who is here today with chest discomfort and hemoptysis. He developed dry cough initially today and chest discomfort today without f/c/r, wheezing, DOE, SOB, LL edema, rash, URTI symptoms, N/V/D, or abd pain. When EMS arrived, he coughed fresh blood, large amount. This is never happened before. O2 was increased from 2 to 4 L Mar-Mac. He lost 50 Ib over 2 yrs intentionally and has good appetite. Denied night sweats, trauma, and previous hx of malignancy. Smoked for 30 yrs, 1 ppd, but quit 1978. Denied alcohol and illicit drug use.   Pertinent Medical History:  CAD s/p PCI x2 (12/2021), PVD s/p L SFA stent (2009), HTN, emphysema on nocturnal home O2 @ 2 L [place], DM-2, dyslipidemia, CKD-3b, and CHF (June 2024 EF 40-45%, G1DD)   Significant Hospital Events: Including procedures, antibiotic start and stop dates in addition to other pertinent events   12/22 - Admitted for hemoptysis.  CTA with no PE.  Noted rounded masslike opacity in the left upper lobe. 12/31 O2 requirements had improved and transferred out of ICU. Then in evening hrs required 40 liters and 80%   Interim History / Subjective:   Remains on high flow nasal cannula.  States that breathing is stable.  Objective:  Blood pressure 127/67, pulse 75, temperature 97.7 F (36.5 C), temperature source Oral, resp. rate 20, height 6' 2 (1.88 m), weight 99.5 kg, SpO2 92%.    FiO2 (%):  [89 %-100 %] 89 %   Intake/Output Summary (Last 24 hours) at 09/19/2023 1233 Last data filed at 09/19/2023 9161 Gross per 24 hour  Intake 600 ml   Output 1075 ml  Net -475 ml   Filed Weights   09/18/23 0013 09/18/23 0928 09/19/23 9366  Weight: 97.1 kg 89.1 kg 99.5 kg   Physical Examination: Gen:      No acute distress HEENT:  EOMI, sclera anicteric Neck:     No masses; no thyromegaly,  Lungs:    Clear to auscultation bilaterally; normal respiratory effort CV:         Regular rate and rhythm; no murmurs Abd:      + bowel sounds; soft, non-tender; no palpable masses, no distension Ext:    No edema; adequate peripheral perfusion Skin:      Warm and dry; no rash Neuro: alert and oriented x 3 Psych: normal mood and affect   Resolved Hospital Problem List:  Hemoptysis   Assessment & Plan:  Acute of chronic hypoxic resp failure due to Community-acquired pneumonia +/-  mass superimposed on Emphysema on nocturnal home O2 @ 2 L [place].  -increased O2 demand yesterday evening, CXR w/ worsening aeration/inc'd consolidation  LLL, -completed abx  Plan Cont to wean O2 Continue prednisone  at 40mg /day Finished ceftriaxone  and azithromycin  antibiotics for community-acquired pneumonia Added hypertonic saline nebs scheduled BD/ICS ordered as PRN Incentive spirometry, flutter valve No evidence of pleural effusion on bedside ultrasound on 1/1 Since he is still significantly hypoxic we will repeat CT scan chest and echocardiogram with bubble study to evaluate shunt  CAD s/p PCI x2 (12/2021) PVD s/p L  SFA stent (2009) CHF Echo 02/2023, EF 40-45%, G1DD  HTN HLD Plan Telemetry monitoring Entresto , Toprol  Cont plavix   AKI on CKD stage 3a Scr stable, -9.4 liters Plan Daily assessment for diuretics Strict I&O Am chem  Renal dose meds  T2DM Plan SSI coverage  Depression Plan Continue Lexapro   Goals of care Palliative care is on board. Plan DNR/DNI   Best Practice (right click and Reselect all SmartList Selections daily)   Diet/type: Regular consistency (see orders) DVT prophylaxis SCD Pressure ulcer(s): N/A GI  prophylaxis: N/A Lines: N/A Foley:  N/A Code Status:  full code Last date of multidisciplinary goals of care discussion [patient]  Lonna Coder MD [facility] See Amion for pager  If no response to pager , please call [phone] until 7pm After 7:00 pm call Elink  [phone] 09/19/2023, 12:33 PM

## 2023-09-19 NOTE — TOC Initial Note (Addendum)
 Transition of Care (TOC) - Initial/Assessment Note    Patient Details  Name: Jonathan [name]. MRN: [id number] Date of Birth: September 24, 1935  Transition of Care [facility]) CM/SW Contact:    Roxie KANDICE Stain, RN Phone Number: 09/19/2023, 3:40 PM  Clinical Narrative:                  Spoke to patient at bedside regarding LTAC. Offered patient choice.  Patient is in agreement to go to [facility] when Attending MD feels he is medically ready.  Patient is on 55 L 02.  Patient is his wife's caregiver who has dementia.  Patient's son and sister are taking care of his wife at this time.  Address, Phone number and PCP verified. TOC following.   Expected Discharge Plan: Long Term Acute Care (LTAC) Barriers to Discharge: Continued Medical Work up   Patient Goals and CMS Choice Patient states their goals for this hospitalization and ongoing recovery are:: recover at LTAC CMS Medicare.gov Compare Post Acute Care list provided to:: Patient Choice offered to / list presented to : Patient      Expected Discharge Plan and Services   Discharge Planning Services: CM Consult Post Acute Care Choice: Long Term Acute Care (LTAC) Living arrangements for the past 2 months: Single Family Home                                      Prior Living Arrangements/Services Living arrangements for the past 2 months: Single Family Home Lives with:: Spouse Patient language and need for interpreter reviewed:: Yes Do you feel safe going back to the place where you live?: Yes      Need for Family Participation in Patient Care: Yes (Comment) Care giver support system in place?: Yes (comment)   Criminal Activity/Legal Involvement Pertinent to Current Situation/Hospitalization: No - Comment as needed  Activities of Daily Living   ADL Screening (condition at time of admission) Independently performs ADLs?: Yes (appropriate for developmental age) Is the patient deaf or have difficulty hearing?: No Does  the patient have difficulty seeing, even when wearing glasses/contacts?: No Does the patient have difficulty concentrating, remembering, or making decisions?: No  Permission Sought/Granted                  Emotional Assessment Appearance:: Appears stated age     Orientation: : Oriented to Situation, Oriented to  Time, Oriented to Place, Oriented to Self Alcohol / Substance Use: Not Applicable Psych Involvement: No (comment)  Admission diagnosis:  Hemoptysis [R04.2] Patient Active Problem List   Diagnosis Date Noted   Hemoptysis 09/09/2023   COPD mixed type (HCC)    Acute on chronic combined systolic and diastolic CHF (congestive heart failure) (HCC) 02/21/2023   CHF (congestive heart failure) (HCC) 02/21/2023   Anxiety and depression 01/31/2022   Chest pain with high risk of acute coronary syndrome    Polycythemia, secondary 08/18/2018   Dyspnea on exertion 01/24/2017   PCP NOTES >>>>>>>>>>>>>>>>>>> 04/03/2016   Coronary artery disease involving native coronary artery of native heart without angina pectoris 01/23/2016   Cramp of both lower extremities 01/23/2016   Chronic kidney disease, stage II (mild) 02/28/2014   Onychomycosis 04/10/2013   Essential tremor 10/14/2012   Annual physical exam 12/11/2011   Vitamin D  deficiency 12/11/2011   Back pain, chronic 06/18/2011   Chronic respiratory failure with hypoxia (HCC)    CAD (coronary artery  disease)    PVD (peripheral vascular disease) (HCC)    Headache 10/06/2010   Hypogonadism in male 06/21/2010   DMII (diabetes mellitus, type 2) (HCC) 03/17/2010   Hyperlipidemia 03/17/2010   Essential hypertension 03/17/2010   PCP:  [name], MD Pharmacy:   [facility] - [place], [place] - [address] [place] [address] Phone: [phone] Fax: [phone]  [facility] [address], [place] - [address] [place]  [place] [address] Phone: [phone] Fax: [phone]     Social Drivers of Health (SDOH) Social History: SDOH Screenings   Food Insecurity: No Food Insecurity (09/09/2023)  Housing: Low Risk  (09/09/2023)  Transportation Needs: No Transportation Needs (09/09/2023)  Utilities: Not At Risk (09/09/2023)  Alcohol Screen: Low Risk  (07/20/2021)  Depression (PHQ2-9): Low Risk  (08/02/2023)  Financial Resource Strain: Low Risk  (07/20/2021)  Physical Activity: Inactive (07/20/2021)  Social Connections: Socially Integrated (09/17/2023)  Stress: No Stress Concern Present (07/20/2021)  Tobacco Use: Medium Risk (09/17/2023)   SDOH Interventions:     Readmission Risk Interventions     No data to display

## 2023-09-20 ENCOUNTER — Inpatient Hospital Stay (HOSPITAL_COMMUNITY): Payer: Medicare Other

## 2023-09-20 DIAGNOSIS — J9601 Acute respiratory failure with hypoxia: Secondary | ICD-10-CM

## 2023-09-20 DIAGNOSIS — C3490 Malignant neoplasm of unspecified part of unspecified bronchus or lung: Secondary | ICD-10-CM

## 2023-09-20 DIAGNOSIS — R918 Other nonspecific abnormal finding of lung field: Secondary | ICD-10-CM

## 2023-09-20 DIAGNOSIS — R042 Hemoptysis: Secondary | ICD-10-CM | POA: Diagnosis not present

## 2023-09-20 DIAGNOSIS — J9621 Acute and chronic respiratory failure with hypoxia: Secondary | ICD-10-CM | POA: Diagnosis not present

## 2023-09-20 LAB — CBC
HCT: 43.9 % (ref 39.0–52.0)
Hemoglobin: 14.8 g/dL (ref 13.0–17.0)
MCH: 32.7 pg (ref 26.0–34.0)
MCHC: 33.7 g/dL (ref 30.0–36.0)
MCV: 96.9 fL (ref 80.0–100.0)
Platelets: 376 10*3/uL (ref 150–400)
RBC: 4.53 MIL/uL (ref 4.22–5.81)
RDW: 16.6 % — ABNORMAL HIGH (ref 11.5–15.5)
WBC: 19 10*3/uL — ABNORMAL HIGH (ref 4.0–10.5)
nRBC: 0 % (ref 0.0–0.2)

## 2023-09-20 LAB — ECHOCARDIOGRAM COMPLETE BUBBLE STUDY
AR max vel: 2.79 cm2
AV Peak grad: 6.7 mm[Hg]
Ao pk vel: 1.29 m/s
Area-P 1/2: 3.37 cm2
P 1/2 time: 619 ms
S' Lateral: 3.2 cm

## 2023-09-20 LAB — GLUCOSE, CAPILLARY
Glucose-Capillary: 113 mg/dL — ABNORMAL HIGH (ref 70–99)
Glucose-Capillary: 185 mg/dL — ABNORMAL HIGH (ref 70–99)
Glucose-Capillary: 198 mg/dL — ABNORMAL HIGH (ref 70–99)
Glucose-Capillary: 200 mg/dL — ABNORMAL HIGH (ref 70–99)

## 2023-09-20 LAB — PROCALCITONIN: Procalcitonin: <0.1 ng/mL

## 2023-09-20 MED ORDER — PERFLUTREN LIPID MICROSPHERE
1.0000 mL | INTRAVENOUS | Status: AC | PRN
  Administered 2023-09-20: 3 mL via INTRAVENOUS

## 2023-09-20 NOTE — Plan of Care (Signed)

## 2023-09-20 NOTE — Progress Notes (Addendum)
 NAME:  Jonathan Self., MRN:  [id number], DOB:  1936-05-23, LOS: 11 ADMISSION DATE:  09/08/2023, CONSULTATION DATE: 09/09/2023 REFERRING MD: [facility]ancour - EDP CHIEF COMPLAINT:  Hemoptysis   History of Present Illness:  88 year old man with PMHx significant for CAD s/p PCI x2 (12/2021), PVD s/p L SFA stent (2009), HTN, emphysema on nocturnal home O2 @ 2 L Jonathan [name], DM-2, dyslipidemia, CKD-3b, and CHF (June 2024 EF 40-45%, G1DD) who is here today with chest discomfort and hemoptysis. He developed dry cough initially today and chest discomfort today without f/c/r, wheezing, DOE, SOB, LL edema, rash, URTI symptoms, N/V/D, or abd pain. When EMS arrived, he coughed fresh blood, large amount. This is never happened before. O2 was increased from 2 to 4 L Jonathan [name]. He lost 50 Ib over 2 yrs intentionally and has good appetite. Denied night sweats, trauma, and previous hx of malignancy. Smoked for 30 yrs, 1 ppd, but quit 1978. Denied alcohol and illicit drug use.   Pertinent Medical History:  CAD s/p PCI x2 (12/2021), PVD s/p L SFA stent (2009), HTN, emphysema on nocturnal home O2 @ 2 L Jonathan [name], DM-2, dyslipidemia, CKD-3b, and CHF (June 2024 EF 40-45%, G1DD)   Significant Hospital Events: Including procedures, antibiotic start and stop dates in addition to other pertinent events   12/22 - Admitted for hemoptysis.  CTA with no PE.  Noted rounded masslike opacity in the left upper lobe. 12/31 O2 requirements had improved and transferred out of ICU. Then in evening hrs required 40 liters and 80%   Interim History / Subjective:  Hemoptysis last night after CT. SOB stable. Remains on HHFNC, 45L, 62% FiO2.   Objective:  Blood pressure 128/81, pulse 67, temperature 97.6 F (36.4 C), temperature source Oral, resp. rate 16, height 6' 2 (1.88 m), weight 98.5 kg, SpO2 94%.    FiO2 (%):  [65 %-95 %] 65 %   Intake/Output Summary (Last 24 hours) at 09/20/2023 1324 Last data filed at 09/20/2023 0428 Gross per 24 hour  Intake 600 ml   Output 1350 ml  Net -750 ml   Filed Weights   09/18/23 0928 09/19/23 0633 09/20/23 0400  Weight: 89.1 kg 99.5 kg 98.5 kg   Physical Examination: Gen:      Elderly man lying in bed in NAD.  HEENT:  [place]/AT, eyes anicteric Neck:     no cervical or supraclavicular adenopathy Lungs:    CTAB, breathing comfortably on HHFNC, no tachypnea or conversational dyspnea. Dark red blood in cannister from hemoptysis episode last night. CV:         S1S2, RRR  Abd:      Soft, NT Ext:    No edema or cyanosis  Skin:      no rashes, skin warm & dry Neuro:   awake, alert, moving all extremities Psych:   cooperative with exam, normal affect  WBC 19 H/H 14.8/43.9 CT chest: Lingular mass, opacification in central L mainstem bronchus, resolving peripheral GG opacity Echo bubble study: pending   Resolved Hospital Problem List:  Hemoptysis   Assessment & Plan:  Acute of chronic hypoxic resp failure due to community-acquired pneumonia that is most likely post-obstructive pneumonia. Large lingular in medial lingular.  Baseline nocturnal home O2 @ 2 L .  -continues to have very high oxygen  requirements, continue weaning supplemental oxygen  - Continue prednisone  --check procalcitonin with rising WBC; no concerns over infiltrates to explain a new pneumonia, but there are some still unresolved areas of GG -Hypomobility, incentive spirometry, valve -  With ongoing hemoptysis not a good candidate for discharge to LTAC at this time. -Awaiting bubble echo results -Continue triple inhaled therapy-Pulmicort , Brovana , Yupelri   Hemoptysis Lingular lung mass, spiculated -bronch Tues with Dr. Brenna; discussed that if he has more frequent hemoptysis, we will have to be more aggressive with diagnostic procedures due to potential need for radiation to help with hemoptysis.  If he has massive hemoptysis would urgently prefer embolization.  At this point with his oxygen  requirements he would be increased risk for failing  to come off ventilator at the end of the case.  I discussed with this with the patient and Dr. Brenna.  Continue try to wean oxygen  over neck several days. -Quantify hemoptysis; if significant in volume may need to consider embolization, but so far do not think that it has been voluminous enough to warrant this.  CAD s/p PCI x2 (12/2021) PVD s/p L SFA stent (2009) CHF Echo 02/2023, EF 40-45%, G1DD  HTN HLD Plan -Okay to continue Plavix  for now, may need to be held if hemoptysis worsens - Continue GDMT per primary  AKI on CKD stage 3a-stable -Monitor -Dose meds and avoid nephrotoxins  T2DM -Per primary  Depression Primary  Goals of care Palliative care is on board.  Plan DNR/DNI  Discussed recommendations with Dr. Briana.  Given significant oxygen  requirements and occurrence of hemoptysis in the last 24 hours he is not stable for discharge.   Best Practice (right click and Reselect all SmartList Selections daily)   Diet/type: Regular consistency (see orders) DVT prophylaxis SCD Pressure ulcer(s): N/A GI prophylaxis: N/A Lines: N/A Foley:  N/A Code Status:  full code Last date of multidisciplinary goals of care discussion [patient]  Jonathan SHAUNNA Gaskins, DO 09/20/23 3:07 PM [facility]  For contact information, see Amion. If no response to pager, please call PCCM consult pager. After hours, 7PM- 7AM, please call Elink.

## 2023-09-20 NOTE — TOC Progression Note (Addendum)
 Transition of Care (TOC) - Progression Note    Patient Details  Name: Jonathan [name]. MRN: [id number] Date of Birth: Mar 27, 1936  Transition of [facility]) CM/SW Contact  Justina Delcia Czar, RN Phone Number: [phone] 09/20/2023, 11:19 AM  Clinical Narrative:    Received message from Unit RN that pt had questions about [facility]. Spoke to pt and he is requesting his sister, [name] tour facility. Contacted pt's sister, [name] to follow up. Contacted sister [name] states she can go advertising account executive for tour. Contacted rep to schedule tour with family.   Contacted [facility] rep, Eric to arrange tour. Pt was accepted at [facility], Room 324, Nurse report [phone], Dr Hillman MD accepting, MD call for handoff, [phone]. Updated attending of approval. Will need [facility] transport.   Updated [facility], pt will not dc today. CM will update when he will dc.    Expected Discharge Plan: Long Term Acute Care (LTAC) Barriers to Discharge: Continued Medical Work up  Expected Discharge Plan and Services   Discharge Planning Services: CM Consult Post Acute Care Choice: Long Term Acute Care (LTAC) Living arrangements for the past 2 months: Single Family Home                                       Social Determinants of Health (SDOH) Interventions SDOH Screenings   Food Insecurity: No Food Insecurity (09/09/2023)  Housing: Low Risk  (09/09/2023)  Transportation Needs: No Transportation Needs (09/09/2023)  Utilities: Not At Risk (09/09/2023)  Alcohol Screen: Low Risk  (07/20/2021)  Depression (PHQ2-9): Low Risk  (08/02/2023)  Financial Resource Strain: Low Risk  (07/20/2021)  Physical Activity: Inactive (07/20/2021)  Social Connections: Socially Integrated (09/17/2023)  Stress: No Stress Concern Present (07/20/2021)  Tobacco Use: Medium Risk (09/17/2023)    Readmission Risk Interventions     No data to display

## 2023-09-20 NOTE — Progress Notes (Signed)
 Echocardiogram 2D Echocardiogram has been performed.  Lucendia Herrlich 09/20/2023, 11:24 AM

## 2023-09-20 NOTE — Progress Notes (Signed)
 Low procalcitonin, will con't to monitor off antibiotics.   Leita SHAUNNA Gaskins, DO 09/20/23 6:02 PM [facility]  For contact information, see Amion. If no response to pager, please call PCCM consult pager. After hours, 7PM- 7AM, please call Elink.

## 2023-09-20 NOTE — Progress Notes (Signed)
 PROGRESS NOTE    Jonathan [name].  FMW:[id number] DOB: [birth year] DOA: 09/08/2023 PCP: [name], MD   Brief Narrative: Jonathan [name]. is a 88 y.o. male with a history of CAD, PVD, hypertension, emphysema, chronic hypoxia on 2 L of oxygen , diabetes mellitus type 2, hyperlipidemia, CKD stage IIIb, CHF.  Patient presented secondary to chest discomfort and hemoptysis with associated respiratory failure.  Patient observed and managed in ICU by PCCM.  Patient also started empirically on antibiotics for presumed pneumonia.  Patient continued to room require significant rate of oxygen  supplementation to maintain oxygen  saturations in the 90s.  Patient is completed antibiotic course with continued/persistent severe hypoxia.  Steroids started.  Wean oxygen  as able.   Assessment and Plan:  Acute on chronic respiratory failure with hypoxia Secondary to hemoptysis and complicated by possible in addition to left upper lobe consolidation concerning for possible mass.  Further complicated by underlying emphysema.  Patient requiring oxygen  supplementation via heated high flow nasal cannula with rates up to 60 L/min.  He has been managed on breathing treatments, steroids, hypertonic saline per pulmonology recommendations. CT chest (1/2) re-demonstrates LUL mass concerning for bronchogenic neoplasm. Weaned down to 20 L/min this morning. -Wean to baseline 2 L/min as able -Pulmonology recommendations: Prednisone , hypertonic saline, Transthoracic Echocardiogram with bubble study (pending). Recommendations pending today.  Community acquired pneumonia Presumed diagnosis.  Patient treated empirically with ceftriaxone  and azithromycin  with completion of 5 days of antibiotics.  Hemoptysis Presumed secondary to pneumonia with coughing and complicated by aspirin  and Plavix  use.  CT chest also noted a masslike consolidation of unknown etiology.  Hemoptysis initially resolved, but has recurred.  Hemoglobin has  been stable. -Check CBC  Emphysema Noted and present on admission.  Patient is on nocturnal oxygen  at 2 L/min -Continue Yupelri , Brovana , Pulmicort   Left upper lobe lung consolidation Masslike consolidation noted on CT angiogram of the chest from 09/08/2023.  Pulmonology aware and plan for repeat follow-up imaging in 4 to 6 weeks.  CAD PAD History of PCI in 2023 and is status post left SFA stent 2009.  Patient is on aspirin  and Plavix  as an outpatient.  Aspirin  and Plavix  were held on admission secondary to submassive hemoptysis.  Plavix  restarted. -Continue Plavix  -Continue simvastatin   Chronic combined systolic and diastolic heart failure Stable.  No evidence of acute heart failure at this time.  Likely not contributing to respiratory failure. -Continue Toprol  XL, Entresto   CKD stage IIIb Baseline creatinine is about 1.2-1.4.  Peak creatinine this admission of 1.55 on admission.  Creatinine improved toward baseline.  Primary hypertension Normotensive to hypertensive during this admission. -Continue Entresto  and metoprolol  succinate  Leukocytosis No associated fevers.  Likely secondary to steroids.   DVT prophylaxis: Lovenox  Code Status:   Code Status: Limited: Do not attempt resuscitation (DNR) -DNR-LIMITED -Do Not Intubate/DNI  Family Communication: None at bedside Disposition Plan: Discharge possibly to LTAC pending continued pulmonology workup/recommendations versus discharge home if up is able to wean oxygen  low enough enough.   Consultants:  Pulmonology  Procedures:  None  Antimicrobials: Ceftriaxone  Azithromycin    Subjective: Patient reports no dyspnea today.  No chest pain.  He does report some hemoptysis overnight, stating that he coughed up a clot.  Objective: BP 138/69 (BP Location: Left Arm)   Pulse 71   Temp 97.6 F (36.4 C) (Oral)   Resp 18   Ht 6' 2 (1.88 m)   Wt 98.5 kg   SpO2 94%   BMI 27.88  kg/m   Examination:  General exam: Appears  calm and comfortable Respiratory system: Clear to auscultation. Respiratory effort normal. Cardiovascular system: S1 & S2 heard, RRR. Gastrointestinal system: Abdomen is nondistended, soft and nontender. Normal bowel sounds heard. Central nervous system: Alert and oriented. No focal neurological deficits. Psychiatry: Judgement and insight appear normal. Mood & affect appropriate.    Data Reviewed: I have personally reviewed following labs and imaging studies  CBC Lab Results  Component Value Date   WBC 16.5 (H) 09/18/2023   RBC 4.65 09/18/2023   HGB 14.8 09/18/2023   HCT 44.5 09/18/2023   MCV 95.7 09/18/2023   MCH 31.8 09/18/2023   PLT 383 09/18/2023   MCHC 33.3 09/18/2023   RDW 16.3 (H) 09/18/2023   LYMPHSABS 4.1 (H) 09/08/2023   MONOABS 1.4 (H) 09/08/2023   EOSABS 0.3 09/08/2023   BASOSABS 0.1 09/08/2023     Last metabolic panel Lab Results  Component Value Date   NA 134 (L) 09/18/2023   K 4.5 09/18/2023   CL 105 09/18/2023   CO2 21 (L) 09/18/2023   BUN 33 (H) 09/18/2023   CREATININE 1.33 (H) 09/18/2023   GLUCOSE 122 (H) 09/18/2023   GFRNONAA 52 (L) 09/18/2023   GFRAA 48 (L) 11/19/2019   CALCIUM  8.4 (L) 09/18/2023   PHOS 3.4 09/10/2023   PROT 7.2 09/08/2023   ALBUMIN 3.3 (L) 09/08/2023   BILITOT 0.3 09/08/2023   ALKPHOS 91 09/08/2023   AST 20 09/08/2023   ALT 19 09/08/2023   ANIONGAP 8 09/18/2023    GFR: Estimated Creatinine Clearance: 47.2 mL/min (A) (by C-G formula based on SCr of 1.33 mg/dL (H)).  Recent Results (from the past 240 hours)  MRSA Next Gen by PCR, Nasal     Status: None   Collection Time: 09/18/23 12:01 AM   Specimen: Nasal Mucosa; Nasal Swab  Result Value Ref Range Status   MRSA by PCR Next Gen NOT DETECTED NOT DETECTED Final    Comment: (NOTE) The GeneXpert MRSA Assay (FDA approved for NASAL specimens only), is one component of a comprehensive MRSA colonization surveillance program. It is not intended to diagnose MRSA infection nor  to guide or monitor treatment for MRSA infections. Test performance is not FDA approved in patients less than 60 years old. Performed at [facility], [address], [place], [place] [address]       Radiology Studies: CT CHEST WO CONTRAST Result Date: 09/20/2023 CLINICAL DATA:  88 year old male with history of shortness of breath and cough. EXAM: CT CHEST WITHOUT CONTRAST TECHNIQUE: Multidetector CT imaging of the chest was performed following the standard protocol without IV contrast. RADIATION DOSE REDUCTION: This exam was performed according to the departmental dose-optimization program which includes automated exposure control, adjustment of the mA and/or kV according to patient size and/or use of iterative reconstruction technique. COMPARISON:  Chest CT 09/08/2023. FINDINGS: Cardiovascular: Heart size is mildly enlarged. Small amount of pericardial fluid and/or thickening, unlikely to be of any hemodynamic significance at this time. No pericardial calcification. There is aortic atherosclerosis, as well as atherosclerosis of the great vessels of the mediastinum and the coronary arteries, including calcified atherosclerotic plaque in the left main, left anterior descending, left circumflex and right coronary arteries. Mediastinum/Nodes: No pathologically enlarged mediastinal or hilar lymph nodes. Please note that accurate exclusion of hilar adenopathy is limited on noncontrast CT scans. Esophagus is unremarkable in appearance. No axillary lymphadenopathy. Lungs/Pleura: Some of the airspace consolidation noted in the left upper lobe on the  prior study has resolved. However, there continues to be a aggressive appearing mass-like area in the medial aspect of the left upper lobe which has macrolobulated and spiculated margins measuring approximately 4.7 x 4.1 cm (axial image 78 of series 2) making broad contact with the pleura medially, highly concerning for primary bronchogenic neoplasm. Patchy  areas of ground-glass attenuation and septal thickening persist in the periphery of the left upper lobe, likely persistent postobstructive pneumonitis. 6 mm nodule associated with the minor fissure (axial image 73 of series 3), unchanged in retrospect compared to the prior examination. Trace right pleural effusion. No left pleural effusion. Diffuse bronchial wall thickening with moderate centrilobular and paraseptal emphysema. Upper Abdomen: Aortic atherosclerosis. Low-attenuation lesion in the medial aspect of the upper pole of the right kidney (axial image 151 of series 2), incompletely characterized on today's noncontrast CT examination, but statistically likely a cyst (no imaging follow-up recommended). Musculoskeletal: There are no aggressive appearing lytic or blastic lesions noted in the visualized portions of the skeleton. IMPRESSION: 1. Persistent aggressive appearing mass in the central aspect of the left upper lobe highly concerning for primary bronchogenic neoplasm. Surrounding postobstructive pneumonia has improved, with residual areas of postobstructive pneumonitis in the left upper lobe noted on today's examination. 2. Trace right pleural effusion. 3. Aortic atherosclerosis, in addition to left main and three-vessel coronary artery disease. 4. Trace amount of pericardial fluid and/or thickening, unlikely to be of any hemodynamic significance at this time. 5. Diffuse bronchial wall thickening with moderate centrilobular and paraseptal emphysema; imaging findings suggestive of underlying COPD. Aortic Atherosclerosis (ICD10-I70.0) and Emphysema (ICD10-J43.9). Electronically Signed   By: Toribio Aye M.D.   On: 09/20/2023 08:02      LOS: 11 days    Elgin Lam, MD [facility] 09/20/2023, 10:27 AM   If 7PM-7AM, please contact night-coverage [email or web address]

## 2023-09-20 NOTE — Progress Notes (Signed)
 Brief Palliative Medicine Progress Note:  PMT following peripherally for needs/decline:  Medical records reviewed including progress notes, labs, imaging.   Goals are clear for DNR-limited and full scope care with pending disposition to LTAC. PMT will follow peripherally and visit with patient and family incrementally for goals of care discussions as appropriate and based on clinical course. If there are any imminent needs please call the service directly.   Thank you for allowing PMT to assist in the care of this patient.  Coralyn Roselli M. [facility] Palliative Medicine Team Team Phone: [phone] NO CHARGE

## 2023-09-21 DIAGNOSIS — R918 Other nonspecific abnormal finding of lung field: Secondary | ICD-10-CM | POA: Diagnosis not present

## 2023-09-21 DIAGNOSIS — J9621 Acute and chronic respiratory failure with hypoxia: Secondary | ICD-10-CM | POA: Diagnosis not present

## 2023-09-21 DIAGNOSIS — R042 Hemoptysis: Secondary | ICD-10-CM | POA: Diagnosis not present

## 2023-09-21 LAB — GLUCOSE, CAPILLARY
Glucose-Capillary: 151 mg/dL — ABNORMAL HIGH (ref 70–99)
Glucose-Capillary: 119 mg/dL — ABNORMAL HIGH (ref 70–99)
Glucose-Capillary: 225 mg/dL — ABNORMAL HIGH (ref 70–99)
Glucose-Capillary: 323 mg/dL — ABNORMAL HIGH (ref 70–99)

## 2023-09-21 LAB — CBC
HCT: 40.3 % (ref 39.0–52.0)
Hemoglobin: 13.5 g/dL (ref 13.0–17.0)
MCH: 32.7 pg (ref 26.0–34.0)
MCHC: 33.5 g/dL (ref 30.0–36.0)
MCV: 97.6 fL (ref 80.0–100.0)
Platelets: 326 10*3/uL (ref 150–400)
RBC: 4.13 MIL/uL — ABNORMAL LOW (ref 4.22–5.81)
RDW: 16.4 % — ABNORMAL HIGH (ref 11.5–15.5)
WBC: 14.5 10*3/uL — ABNORMAL HIGH (ref 4.0–10.5)
nRBC: 0 % (ref 0.0–0.2)

## 2023-09-21 NOTE — Plan of Care (Signed)

## 2023-09-21 NOTE — Progress Notes (Signed)
 RT note. Patient placed on 4 L salter at this time. Patient sat 95% with sat goal 88-92%. No labored breathing noted at this time. HHFNC left in patients room if needed again. RT will continue to monitor.    09/21/23 0836  Therapy Vitals  Pulse Rate 64  Resp 20  MEWS Score/Color  MEWS Score 0  MEWS Score Color Green  Respiratory Assessment  Assessment Type Assess only  Respiratory Pattern Regular;Unlabored  Chest Assessment Chest expansion symmetrical  Bilateral Breath Sounds Clear;Diminished  Oxygen  Therapy/Pulse Ox  O2 Device (S)  HFNC  O2 Therapy Oxygen  humidified  O2 Flow Rate (L/min) (S)  4 L/min (sat goal 88-92%)  SpO2 95 %

## 2023-09-21 NOTE — Progress Notes (Signed)
 Mobility Specialist Progress Note:   09/21/23 1000  Oxygen  Therapy  O2 Device HFNC  Mobility  Activity Ambulated with assistance in hallway  Level of Assistance Contact guard assist, steadying assist  Assistive Device Front wheel walker  Distance Ambulated (ft) 320 ft  Activity Response Tolerated well  Mobility Referral Yes  Mobility visit 1 Mobility  Mobility Specialist Start Time (ACUTE ONLY) 1002  Mobility Specialist Stop Time (ACUTE ONLY) 1020  Mobility Specialist Time Calculation (min) (ACUTE ONLY) 18 min    Pre Mobility:  94% SpO2 4L During Mobility:  86% SpO2 8L Post Mobility:  90% SpO2 4L  Pt received in bed, eager to mobility. Required on contact guard throughout. Desat to 86% on 8L O2 during ambulation. Asymptomatic throughout. Practiced pursed lipped breathing. Pt left in chair with call bell and all needs met. SpO2 at 90% on 4L O2.  D'Vante Nicholaus Mobility Specialist Please contact via Special Educational Needs Teacher or Rehab office at [phone]

## 2023-09-21 NOTE — Progress Notes (Signed)
 PROGRESS NOTE    Jonathan [name].  FMW:[id number] DOB: [birth year] DOA: 09/08/2023 PCP: [name], MD   Brief Narrative: Jonathan Shell. is a 88 y.o. male with a history of CAD, PVD, hypertension, emphysema, chronic hypoxia on 2 L of oxygen , diabetes mellitus type 2, hyperlipidemia, CKD stage IIIb, CHF.  Patient presented secondary to chest discomfort and hemoptysis with associated respiratory failure.  Patient observed and managed in ICU by PCCM.  Patient also started empirically on antibiotics for presumed pneumonia.  Patient continued to room require significant rate of oxygen  supplementation to maintain oxygen  saturations in the 90s.  Patient is completed antibiotic course with continued/persistent severe hypoxia.  Steroids started.  Wean oxygen  as able.   Assessment and Plan:  Acute on chronic respiratory failure with hypoxia Secondary to hemoptysis and complicated by possible in addition to left upper lobe consolidation concerning for possible mass.  Further complicated by underlying emphysema.  Patient requiring oxygen  supplementation via heated high flow nasal cannula with rates up to 60 L/min.  He has been managed on breathing treatments, steroids, hypertonic saline per pulmonology recommendations. CT chest (1/2) re-demonstrates LUL mass concerning for bronchogenic neoplasm. Weaned down to 4 L/min this morning. No shunt identified on Transthoracic Echocardiogram. -Wean to baseline 2 L/min as able -Pulmonology recommendations: Prednisone , hypertonic saline, bronchoscopy for evaluation of pulmonary mass.  Community acquired pneumonia Presumed diagnosis.  Patient treated empirically with ceftriaxone  and azithromycin  with completion of 5 days of antibiotics.  Hemoptysis Presumed secondary to pneumonia with coughing and complicated by aspirin  and Plavix  use.  CT chest also noted a masslike consolidation of unknown etiology.  Hemoptysis initially resolved, but patient had  recurrence.  Hemoglobin remains stable.  Emphysema Noted and present on admission.  Patient is on nocturnal oxygen  at 2 L/min -Continue Yupelri , Brovana , Pulmicort   Left upper lobe lung consolidation Masslike consolidation noted on CT angiogram of the chest from 09/08/2023.  Pulmonology plan for bronchoscopy prior to discharge.  CAD PAD History of PCI in 2023 and is status post left SFA stent 2009.  Patient was on Plavix  as an outpatient; patient states Aspirin  was discontinued prior to admission. Plavix  was held on admission secondary to submassive hemoptysis.  Plavix  restarted. -Continue Plavix  -Continue simvastatin   Chronic combined systolic and diastolic heart failure Stable.  No evidence of acute heart failure at this time.  Likely not contributing to respiratory failure. -Continue Toprol  XL, Entresto   CKD stage IIIb Baseline creatinine is about 1.2-1.4.  Peak creatinine this admission of 1.55 on admission.  Creatinine improved toward baseline.  Primary hypertension Normotensive to hypertensive during this admission. -Continue Entresto  and metoprolol  succinate  Leukocytosis No associated fevers.  Likely secondary to steroids. Mild increase to peak of 19,000. Procalcitonin undetectable. WBC down to 14,500. Possibly still related to steroid dosing.   DVT prophylaxis: Lovenox  Code Status:   Code Status: Limited: Do not attempt resuscitation (DNR) -DNR-LIMITED -Do Not Intubate/DNI  Family Communication: None at bedside Disposition Plan: Discharge likely home pending continued pulmonology recommendations/management, including plan for bronchoscopy.   Consultants:  Pulmonology  Procedures:  Transthoracic Echocardiogram with bubble study  Antimicrobials: Ceftriaxone  Azithromycin    Subjective: Patient feels well today. No dyspnea. Eager to mobilize.  Objective: BP 124/61 (BP Location: Left Arm) Comment: taken twice'  Pulse 64   Temp 97.6 F (36.4 C) (Oral)   Resp  20   Ht 6' 2 (1.88 m)   Wt 101 kg   SpO2 95%   BMI 28.59 kg/m  Examination:  General exam: Appears calm and comfortable Respiratory system: Clear to auscultation. Respiratory effort normal. Cardiovascular system: S1 & S2 heard, RRR. No murmurs. Gastrointestinal system: Abdomen is nondistended, soft and nontender. Normal bowel sounds heard. Central nervous system: Alert and oriented. No focal neurological deficits. Musculoskeletal: No edema. No calf tenderness Psychiatry: Judgement and insight appear normal. Mood & affect appropriate.    Data Reviewed: I have personally reviewed following labs and imaging studies  CBC Lab Results  Component Value Date   WBC 14.5 (H) 09/21/2023   RBC 4.13 (L) 09/21/2023   HGB 13.5 09/21/2023   HCT 40.3 09/21/2023   MCV 97.6 09/21/2023   MCH 32.7 09/21/2023   PLT 326 09/21/2023   MCHC 33.5 09/21/2023   RDW 16.4 (H) 09/21/2023   LYMPHSABS 4.1 (H) 09/08/2023   MONOABS 1.4 (H) 09/08/2023   EOSABS 0.3 09/08/2023   BASOSABS 0.1 09/08/2023     Last metabolic panel Lab Results  Component Value Date   NA 134 (L) 09/18/2023   K 4.5 09/18/2023   CL 105 09/18/2023   CO2 21 (L) 09/18/2023   BUN 33 (H) 09/18/2023   CREATININE 1.33 (H) 09/18/2023   GLUCOSE 122 (H) 09/18/2023   GFRNONAA 52 (L) 09/18/2023   GFRAA 48 (L) 11/19/2019   CALCIUM  8.4 (L) 09/18/2023   PHOS 3.4 09/10/2023   PROT 7.2 09/08/2023   ALBUMIN 3.3 (L) 09/08/2023   BILITOT 0.3 09/08/2023   ALKPHOS 91 09/08/2023   AST 20 09/08/2023   ALT 19 09/08/2023   ANIONGAP 8 09/18/2023    GFR: Estimated Creatinine Clearance: 51.5 mL/min (A) (by C-G formula based on SCr of 1.33 mg/dL (H)).  Recent Results (from the past 240 hours)  MRSA Next Gen by PCR, Nasal     Status: None   Collection Time: 09/18/23 12:01 AM   Specimen: Nasal Mucosa; Nasal Swab  Result Value Ref Range Status   MRSA by PCR Next Gen NOT DETECTED NOT DETECTED Final    Comment: (NOTE) The GeneXpert MRSA  Assay (FDA approved for NASAL specimens only), is one component of a comprehensive MRSA colonization surveillance program. It is not intended to diagnose MRSA infection nor to guide or monitor treatment for MRSA infections. Test performance is not FDA approved in patients less than 77 years old. Performed at [facility], [address], [place], [place] [address]       Radiology Studies: ECHOCARDIOGRAM COMPLETE BUBBLE STUDY Result Date: 09/20/2023    ECHOCARDIOGRAM REPORT   Patient Name:   Tillman Kazmierski. Date of Exam: 09/20/2023 Medical Rec #:  [id number]           Height:       74.0 in Accession #:    [id number]          Weight:       217.2 lb Date of Birth:  1935-10-31            BSA:          2.251 m Patient Age:    87 years            BP:           138/69 mmHg Patient Gender: M                   HR:           50 bpm. Exam Location:  Inpatient Procedure: 2D Echo, Cardiac Doppler, Color Doppler, Saline Contrast Bubble Study  and Intracardiac Opacification Agent Indications:    Acute Resp failure  History:        Patient has prior history of Echocardiogram examinations, most                 recent 02/21/2023. CHF, CAD, COPD; Risk Factors:Hypertension,                 Diabetes and Dyslipidemia.  Sonographer:    Thea Norlander Referring Phys: [id number] PRAVEEN MANNAM IMPRESSIONS  1. Left ventricular ejection fraction, by estimation, is 55 to 60%. The left ventricle has normal function. The left ventricle has no regional wall motion abnormalities. There is mild concentric left ventricular hypertrophy. Left ventricular diastolic parameters are consistent with Grade I diastolic dysfunction (impaired relaxation).  2. Right ventricular systolic function is normal. The right ventricular size is normal.  3. Left atrial size was moderately dilated.  4. Mild flail of anterior leaflet.  5. The mitral valve is degenerative. Mild mitral valve regurgitation.  6. The aortic valve is tricuspid. There is  mild calcification of the aortic valve. There is mild thickening of the aortic valve. Aortic valve regurgitation is mild. Aortic valve sclerosis/calcification is present, without any evidence of aortic stenosis.  7. There is mild (Grade II) atheroma plaque involving the aortic root and ascending aorta.  8. The inferior vena cava is dilated in size with >50% respiratory variability, suggesting right atrial pressure of 8 mmHg. FINDINGS  Left Ventricle: Left ventricular ejection fraction, by estimation, is 55 to 60%. The left ventricle has normal function. The left ventricle has no regional wall motion abnormalities. Definity  contrast agent was given IV to delineate the left ventricular  endocardial borders. The left ventricular internal cavity size was normal in size. There is mild concentric left ventricular hypertrophy. Left ventricular diastolic parameters are consistent with Grade I diastolic dysfunction (impaired relaxation). Right Ventricle: The right ventricular size is normal. No increase in right ventricular wall thickness. Right ventricular systolic function is normal. Left Atrium: Left atrial size was moderately dilated. Right Atrium: Right atrial size was normal in size. Pericardium: There is no evidence of pericardial effusion. Mitral Valve: Can not exclude vegetation. The mitral valve is degenerative in appearance. Mild flail of the middle segment of the anterior mitral valve leaflet. There is mild calcification of the mitral valve leaflet(s). Mild mitral annular calcification. Mild mitral valve regurgitation. Tricuspid Valve: The tricuspid valve is normal in structure. Tricuspid valve regurgitation is mild. Aortic Valve: The aortic valve is tricuspid. There is mild calcification of the aortic valve. There is mild thickening of the aortic valve. There is mild aortic valve annular calcification. Aortic valve regurgitation is mild. Aortic regurgitation PHT measures 619 msec. Aortic valve  sclerosis/calcification is present, without any evidence of aortic stenosis. Aortic valve peak gradient measures 6.7 mmHg. Pulmonic Valve: The pulmonic valve was normal in structure. Pulmonic valve regurgitation is trivial. Aorta: The aortic root is normal in size and structure. There is mild (Grade II) atheroma plaque involving the aortic root and ascending aorta. Venous: The inferior vena cava is dilated in size with greater than 50% respiratory variability, suggesting right atrial pressure of 8 mmHg. IAS/Shunts: The atrial septum is grossly normal.  LEFT VENTRICLE PLAX 2D LVIDd:         4.60 cm   Diastology LVIDs:         3.20 cm   LV e' lateral:   6.75 cm/s LV PW:         1.20 cm  LV E/e' lateral: 9.8 LV IVS:        1.30 cm LVOT diam:     2.20 cm LV SV:         77 LV SV Index:   34 LVOT Area:     3.80 cm  RIGHT VENTRICLE             IVC RV S prime:     16.00 cm/s  IVC diam: 1.90 cm TAPSE (M-mode): 2.2 cm LEFT ATRIUM             Index        RIGHT ATRIUM           Index LA diam:        4.10 cm 1.82 cm/m   RA Area:     16.70 cm LA Vol (A2C):   63.2 ml 28.08 ml/m  RA Volume:   33.00 ml  14.66 ml/m LA Vol (A4C):   53.6 ml 23.79 ml/m LA Biplane Vol: 54.8 ml 24.35 ml/m  AORTIC VALVE AV Area (Vmax): 2.79 cm AV Vmax:        129.00 cm/s AV Peak Grad:   6.7 mmHg LVOT Vmax:      94.80 cm/s LVOT Vmean:     60.300 cm/s LVOT VTI:       0.203 m AI PHT:         619 msec  AORTA Ao Root diam: 3.10 cm Ao Asc diam:  3.90 cm MITRAL VALVE                TRICUSPID VALVE MV Area (PHT): 3.37 cm     TR Peak grad:   9.9 mmHg MV Decel Time: 225 msec     TR Vmax:        157.00 cm/s MV E velocity: 65.90 cm/s MV A velocity: 113.00 cm/s  SHUNTS MV E/A ratio:  0.58         Systemic VTI:  0.20 m                             Systemic Diam: 2.20 cm Salena Negri MD Electronically signed by Salena Negri MD Signature Date/Time: 09/20/2023/5:30:21 PM    Final    CT CHEST WO CONTRAST Result Date: 09/20/2023 CLINICAL DATA:  88 year old male  with history of shortness of breath and cough. EXAM: CT CHEST WITHOUT CONTRAST TECHNIQUE: Multidetector CT imaging of the chest was performed following the standard protocol without IV contrast. RADIATION DOSE REDUCTION: This exam was performed according to the departmental dose-optimization program which includes automated exposure control, adjustment of the mA and/or kV according to patient size and/or use of iterative reconstruction technique. COMPARISON:  Chest CT 09/08/2023. FINDINGS: Cardiovascular: Heart size is mildly enlarged. Small amount of pericardial fluid and/or thickening, unlikely to be of any hemodynamic significance at this time. No pericardial calcification. There is aortic atherosclerosis, as well as atherosclerosis of the great vessels of the mediastinum and the coronary arteries, including calcified atherosclerotic plaque in the left main, left anterior descending, left circumflex and right coronary arteries. Mediastinum/Nodes: No pathologically enlarged mediastinal or hilar lymph nodes. Please note that accurate exclusion of hilar adenopathy is limited on noncontrast CT scans. Esophagus is unremarkable in appearance. No axillary lymphadenopathy. Lungs/Pleura: Some of the airspace consolidation noted in the left upper lobe on the prior study has resolved. However, there continues to be a aggressive appearing mass-like area in the medial aspect of the left  upper lobe which has macrolobulated and spiculated margins measuring approximately 4.7 x 4.1 cm (axial image 78 of series 2) making broad contact with the pleura medially, highly concerning for primary bronchogenic neoplasm. Patchy areas of ground-glass attenuation and septal thickening persist in the periphery of the left upper lobe, likely persistent postobstructive pneumonitis. 6 mm nodule associated with the minor fissure (axial image 73 of series 3), unchanged in retrospect compared to the prior examination. Trace right pleural effusion.  No left pleural effusion. Diffuse bronchial wall thickening with moderate centrilobular and paraseptal emphysema. Upper Abdomen: Aortic atherosclerosis. Low-attenuation lesion in the medial aspect of the upper pole of the right kidney (axial image 151 of series 2), incompletely characterized on today's noncontrast CT examination, but statistically likely a cyst (no imaging follow-up recommended). Musculoskeletal: There are no aggressive appearing lytic or blastic lesions noted in the visualized portions of the skeleton. IMPRESSION: 1. Persistent aggressive appearing mass in the central aspect of the left upper lobe highly concerning for primary bronchogenic neoplasm. Surrounding postobstructive pneumonia has improved, with residual areas of postobstructive pneumonitis in the left upper lobe noted on today's examination. 2. Trace right pleural effusion. 3. Aortic atherosclerosis, in addition to left main and three-vessel coronary artery disease. 4. Trace amount of pericardial fluid and/or thickening, unlikely to be of any hemodynamic significance at this time. 5. Diffuse bronchial wall thickening with moderate centrilobular and paraseptal emphysema; imaging findings suggestive of underlying COPD. Aortic Atherosclerosis (ICD10-I70.0) and Emphysema (ICD10-J43.9). Electronically Signed   By: Toribio Aye M.D.   On: 09/20/2023 08:02      LOS: 12 days    Elgin Lam, MD [facility] 09/21/2023, 9:51 AM   If 7PM-7AM, please contact night-coverage [email or web address]

## 2023-09-22 ENCOUNTER — Telehealth: Payer: Self-pay | Admitting: Acute Care

## 2023-09-22 DIAGNOSIS — J9621 Acute and chronic respiratory failure with hypoxia: Secondary | ICD-10-CM | POA: Diagnosis not present

## 2023-09-22 DIAGNOSIS — R042 Hemoptysis: Secondary | ICD-10-CM | POA: Diagnosis not present

## 2023-09-22 DIAGNOSIS — R918 Other nonspecific abnormal finding of lung field: Secondary | ICD-10-CM | POA: Diagnosis not present

## 2023-09-22 LAB — CBC
HCT: 37.6 % — ABNORMAL LOW (ref 39.0–52.0)
Hemoglobin: 12.6 g/dL — ABNORMAL LOW (ref 13.0–17.0)
MCH: 32.8 pg (ref 26.0–34.0)
MCHC: 33.5 g/dL (ref 30.0–36.0)
MCV: 97.9 fL (ref 80.0–100.0)
Platelets: 311 10*3/uL (ref 150–400)
RBC: 3.84 MIL/uL — ABNORMAL LOW (ref 4.22–5.81)
RDW: 16.3 % — ABNORMAL HIGH (ref 11.5–15.5)
WBC: 17.3 10*3/uL — ABNORMAL HIGH (ref 4.0–10.5)
nRBC: 0 % (ref 0.0–0.2)

## 2023-09-22 LAB — GLUCOSE, CAPILLARY
Glucose-Capillary: 315 mg/dL — ABNORMAL HIGH (ref 70–99)
Glucose-Capillary: 202 mg/dL — ABNORMAL HIGH (ref 70–99)
Glucose-Capillary: 332 mg/dL — ABNORMAL HIGH (ref 70–99)
Glucose-Capillary: 215 mg/dL — ABNORMAL HIGH (ref 70–99)

## 2023-09-22 MED ORDER — METOPROLOL SUCCINATE ER 50 MG PO TB24
75.0000 mg | ORAL_TABLET | Freq: Every day | ORAL | Status: DC
  Administered 2023-09-22 – 2023-09-23 (×2): 75 mg via ORAL
  Filled 2023-09-22 (×2): qty 1

## 2023-09-22 NOTE — Plan of Care (Signed)

## 2023-09-22 NOTE — Progress Notes (Signed)
 Mobility Specialist Progress Note:    09/22/23 0900  Oxygen  Therapy  O2 Device Nasal Cannula  O2 Flow Rate (L/min) 4 L/min  Mobility  Activity Ambulated with assistance in hallway  Level of Assistance Contact guard assist, steadying assist  Assistive Device Front wheel walker;None  Distance Ambulated (ft) 400 ft  Activity Response Tolerated well  Mobility Referral Yes  Mobility visit 1 Mobility  Mobility Specialist Start Time (ACUTE ONLY) [id number]  Mobility Specialist Stop Time (ACUTE ONLY) [id number]  Mobility Specialist Time Calculation (min) (ACUTE ONLY) 14 min   Pt received in bed, eager to mobility. SpO2 in 90s throughout on 4L O2. No complaints throughout. Able to ambulate about 120' w/ no AD at EOS. Some swaying noted but no overt LOB. Pt left in chair with call bell and all needs met.  D'Vante Nicholaus Mobility Specialist Please contact via Special Educational Needs Teacher or Rehab office at [phone]

## 2023-09-22 NOTE — Progress Notes (Signed)
 NAME:  Javarian Jakubiak., MRN:  [id number], DOB:  11/29/1935, LOS: 13 ADMISSION DATE:  09/08/2023, CONSULTATION DATE: 09/09/2023 REFERRING MD: [place] - EDP CHIEF COMPLAINT:  Hemoptysis   History of Present Illness:  88 year old man with PMHx significant for CAD s/p PCI x2 (12/2021), PVD s/p L SFA stent (2009), HTN, emphysema on nocturnal home O2 @ 2 L [place], DM-2, dyslipidemia, CKD-3b, and CHF (June 2024 EF 40-45%, G1DD) who is here today with chest discomfort and hemoptysis. He developed dry cough initially today and chest discomfort today without f/c/r, wheezing, DOE, SOB, LL edema, rash, URTI symptoms, N/V/D, or abd pain. When EMS arrived, he coughed fresh blood, large amount. This is never happened before. O2 was increased from 2 to 4 L [place]. He lost 50 Ib over 2 yrs intentionally and has good appetite. Denied night sweats, trauma, and previous hx of malignancy. Smoked for 30 yrs, 1 ppd, but quit 1978. Denied alcohol and illicit drug use.   Pertinent Medical History:  CAD s/p PCI x2 (12/2021), PVD s/p L SFA stent (2009), HTN, emphysema on nocturnal home O2 @ 2 L [place], DM-2, dyslipidemia, CKD-3b, and CHF (June 2024 EF 40-45%, G1DD)   Significant Hospital Events: Including procedures, antibiotic start and stop dates in addition to other pertinent events   12/22 - Admitted for hemoptysis.  CTA with no PE.  Noted rounded masslike opacity in the left upper lobe. 12/31 O2 requirements had improved and transferred out of ICU. Then in evening hrs required 40 liters and 80% 1/3 Hemoptysis last night after CT. SOB stable. Remains on HHFNC, 45L, 62% FiO2.  1//5 Significant improvement in oxygenation, now on 3.5L, denies any recurrent hemoptysis   Interim History / Subjective:  States he feels well and is asking questions regarding plan of care   Objective:  Blood pressure 122/67, pulse 63, temperature 97.7 F (36.5 C), temperature source Oral, resp. rate 13, height 6' 2 (1.88 m), weight 103.4 kg, SpO2  93%.        Intake/Output Summary (Last 24 hours) at 09/22/2023 1304 Last data filed at 09/22/2023 [id number] Gross per 24 hour  Intake --  Output 575 ml  Net -575 ml   Filed Weights   09/20/23 0400 09/21/23 0524 09/22/23 0341  Weight: 98.5 kg 101 kg 103.4 kg   Physical Examination: General: Acute on chronically ill appearing elderly male sitting up in bedside recliner, in NAD HEENT: [place]/AT, MM pink/moist, PERRL,  Neuro: Alert and oriented x3, non-focal  CV: s1s2 regular rate and rhythm, no murmur, rubs, or gallops,  PULM:  Clear to auscultation, no increased work of breathing, no added breaths on 3.5L [place] GI: soft, bowel sounds active in all 4 quadrants, non-tender, non-distended, tolerating oral diet Extremities: warm/dry, no edema  Skin: no rashes or lesions  Resolved Hospital Problem List:  Hemoptysis   Assessment & Plan:  Acute on chronic hypoxic resp failure due to community-acquired pneumonia that is most likely post-obstructive pneumonia.  Large spiculated lingular lingular mass -Given significant improvement in oxygenation bronchoscopy can be done on the outpatient setting, if patent is deemed stable from a medical standpoint. Tentative plan for inpatient vs outpatient 1/7. Baseline nocturnal home O2 @ 2 L .  Hemoptysis P: Continue supportive care  Bronch as above  Continue steroids Encourage pulmonary hygiene  Continue triple inhaler therapy    Best Practice (right click and Reselect all SmartList Selections daily)   Diet/type: Regular consistency (see orders) DVT prophylaxis SCD Pressure ulcer(s): N/A GI  prophylaxis: N/A Lines: N/A Foley:  N/A Code Status:  full code Last date of multidisciplinary goals of care discussion [patient]  Lydell Moga D. Harris, NP-C [facility] Personal contact information can be found on Amion  If no contact or response made please call [phone] 09/22/2023, 1:07 PM

## 2023-09-22 NOTE — Progress Notes (Signed)
 PROGRESS NOTE    Jonathan [name] Jonathan [name].  FMW:[id number] DOB: [birth year] DOA: 09/08/2023 PCP: [name], MD   Brief Narrative: Jonathan [name]. is a 88 y.o. male with a history of CAD, PVD, hypertension, emphysema, chronic hypoxia on 2 L of oxygen , diabetes mellitus type 2, hyperlipidemia, CKD stage IIIb, CHF.  Patient presented secondary to chest discomfort and hemoptysis with associated respiratory failure.  Patient observed and managed in ICU by PCCM.  Patient also started empirically on antibiotics for presumed pneumonia.  Patient continued to room require significant rate of oxygen  supplementation to maintain oxygen  saturations in the 90s.  Patient is completed antibiotic course with continued/persistent severe hypoxia.  Steroids started.  Wean oxygen  as able.   Assessment and Plan:  Acute on chronic respiratory failure with hypoxia Secondary to hemoptysis and complicated by possible in addition to left upper lobe consolidation concerning for possible mass.  Further complicated by underlying emphysema.  Patient requiring oxygen  supplementation via heated high flow nasal cannula with rates up to 60 L/min.  He has been managed on breathing treatments, steroids, hypertonic saline nebulizer treatments per pulmonology recommendations. CT chest (1/2) re-demonstrates LUL mass concerning for bronchogenic neoplasm. Weaned down to 3.5 L/min this morning. No shunt identified on Transthoracic Echocardiogram. -Wean to room air as able; patient uses 2-3 L/min of oxygen  at night, and intermittently in the day -Pulmonology recommendations: Prednisone , bronchoscopy for evaluation of pulmonary mass.  Community acquired pneumonia Presumed diagnosis.  Patient treated empirically with ceftriaxone  and azithromycin  with completion of 5 days of antibiotics.  Hemoptysis Presumed secondary to pneumonia with coughing and complicated by aspirin  and Plavix  use.  CT chest also noted a masslike consolidation of  unknown etiology.  Hemoptysis initially resolved, but patient had recurrence.  Hemoglobin remains stable.  Emphysema Noted and present on admission.  Patient is on nocturnal oxygen  at 2 L/min -Continue Yupelri , Brovana , Pulmicort   Left upper lobe lung consolidation Masslike consolidation noted on CT angiogram of the chest from 09/08/2023.  Pulmonology plan for bronchoscopy prior to discharge.  CAD PAD History of PCI in 2023 and is status post left SFA stent 2009.  Patient was on Plavix  as an outpatient; patient states Aspirin  was discontinued prior to admission. Plavix  was held on admission secondary to submassive hemoptysis.  Plavix  restarted. -Continue Plavix  -Continue simvastatin   Chronic combined systolic and diastolic heart failure Stable.  No evidence of acute heart failure at this time.  Likely not contributing to respiratory failure. -Continue Toprol  XL, Entresto   CKD stage IIIb Baseline creatinine is about 1.2-1.4.  Peak creatinine this admission of 1.55 on admission.  Creatinine improved toward baseline.  Primary hypertension Normotensive to hypertensive during this admission. -Continue Entresto  and metoprolol  succinate  Leukocytosis No associated fevers.  Likely secondary to steroids. Mild increase to peak of 19,000. Procalcitonin undetectable. WBC remains stable without clear etiology outside of steroids.    DVT prophylaxis: Lovenox  Code Status:   Code Status: Limited: Do not attempt resuscitation (DNR) -DNR-LIMITED -Do Not Intubate/DNI  Family Communication: None at bedside Disposition Plan: Discharge likely home pending continued pulmonology recommendations/management, including plan for bronchoscopy.   Consultants:  Pulmonology  Procedures:  Transthoracic Echocardiogram with bubble study  Antimicrobials: Ceftriaxone  Azithromycin    Subjective: Feels well. Feels good about ambulating yesterday multiple times. Did not realize he required up to 8 L/min of  oxygen , though.  Objective: BP 123/66 (BP Location: Left Arm)   Pulse 60   Temp 97.6 F (36.4 C) (Oral)   Resp  13   Ht 6' 2 (1.88 m)   Wt 103.4 kg   SpO2 91%   BMI 29.27 kg/m   Examination:  General exam: Appears calm and comfortable Respiratory system: Clear to auscultation. Respiratory effort normal. Cardiovascular system: S1 & S2 heard, RRR. No murmurs. Gastrointestinal system: Abdomen is nondistended, soft and nontender. Normal bowel sounds heard. Central nervous system: Alert and oriented. No focal neurological deficits. Musculoskeletal: No edema. No calf tenderness Psychiatry: Judgement and insight appear normal. Mood & affect appropriate.    Data Reviewed: I have personally reviewed following labs and imaging studies  CBC Lab Results  Component Value Date   WBC 17.3 (H) 09/22/2023   RBC 3.84 (L) 09/22/2023   HGB 12.6 (L) 09/22/2023   HCT 37.6 (L) 09/22/2023   MCV 97.9 09/22/2023   MCH 32.8 09/22/2023   PLT 311 09/22/2023   MCHC 33.5 09/22/2023   RDW 16.3 (H) 09/22/2023   LYMPHSABS 4.1 (H) 09/08/2023   MONOABS 1.4 (H) 09/08/2023   EOSABS 0.3 09/08/2023   BASOSABS 0.1 09/08/2023     Last metabolic panel Lab Results  Component Value Date   NA 134 (L) 09/18/2023   K 4.5 09/18/2023   CL 105 09/18/2023   CO2 21 (L) 09/18/2023   BUN 33 (H) 09/18/2023   CREATININE 1.33 (H) 09/18/2023   GLUCOSE 122 (H) 09/18/2023   GFRNONAA 52 (L) 09/18/2023   GFRAA 48 (L) 11/19/2019   CALCIUM  8.4 (L) 09/18/2023   PHOS 3.4 09/10/2023   PROT 7.2 09/08/2023   ALBUMIN 3.3 (L) 09/08/2023   BILITOT 0.3 09/08/2023   ALKPHOS 91 09/08/2023   AST 20 09/08/2023   ALT 19 09/08/2023   ANIONGAP 8 09/18/2023    GFR: Estimated Creatinine Clearance: 52.1 mL/min (A) (by C-G formula based on SCr of 1.33 mg/dL (H)).  Recent Results (from the past 240 hours)  MRSA Next Gen by PCR, Nasal     Status: None   Collection Time: 09/18/23 12:01 AM   Specimen: Nasal Mucosa; Nasal Swab   Result Value Ref Range Status   MRSA by PCR Next Gen NOT DETECTED NOT DETECTED Final    Comment: (NOTE) The GeneXpert MRSA Assay (FDA approved for NASAL specimens only), is one component of a comprehensive MRSA colonization surveillance program. It is not intended to diagnose MRSA infection nor to guide or monitor treatment for MRSA infections. Test performance is not FDA approved in patients less than 57 years old. Performed at [facility], [address] [place], [place] [address]       Radiology Studies: ECHOCARDIOGRAM COMPLETE BUBBLE STUDY Result Date: 09/20/2023    ECHOCARDIOGRAM REPORT   Patient Name:   Jonathan [name]. Date of Exam: 09/20/2023 Medical Rec #:  [id number]           Height:       74.0 in Accession #:    [id number]          Weight:       217.2 lb Date of Birth:  1936-09-16            BSA:          2.251 m Patient Age:    87 years            BP:           138/69 mmHg Patient Gender: M                   HR:  50 bpm. Exam Location:  Inpatient Procedure: 2D Echo, Cardiac Doppler, Color Doppler, Saline Contrast Bubble Study            and Intracardiac Opacification Agent Indications:    Acute Resp failure  History:        Patient has prior history of Echocardiogram examinations, most                 recent 02/21/2023. CHF, CAD, COPD; Risk Factors:Hypertension,                 Diabetes and Dyslipidemia.  Sonographer:    Thea Norlander Referring Phys: [id number] PRAVEEN MANNAM IMPRESSIONS  1. Left ventricular ejection fraction, by estimation, is 55 to 60%. The left ventricle has normal function. The left ventricle has no regional wall motion abnormalities. There is mild concentric left ventricular hypertrophy. Left ventricular diastolic parameters are consistent with Grade I diastolic dysfunction (impaired relaxation).  2. Right ventricular systolic function is normal. The right ventricular size is normal.  3. Left atrial size was moderately dilated.  4. Mild flail of  anterior leaflet.  5. The mitral valve is degenerative. Mild mitral valve regurgitation.  6. The aortic valve is tricuspid. There is mild calcification of the aortic valve. There is mild thickening of the aortic valve. Aortic valve regurgitation is mild. Aortic valve sclerosis/calcification is present, without any evidence of aortic stenosis.  7. There is mild (Grade II) atheroma plaque involving the aortic root and ascending aorta.  8. The inferior vena cava is dilated in size with >50% respiratory variability, suggesting right atrial pressure of 8 mmHg. FINDINGS  Left Ventricle: Left ventricular ejection fraction, by estimation, is 55 to 60%. The left ventricle has normal function. The left ventricle has no regional wall motion abnormalities. Definity  contrast agent was given IV to delineate the left ventricular  endocardial borders. The left ventricular internal cavity size was normal in size. There is mild concentric left ventricular hypertrophy. Left ventricular diastolic parameters are consistent with Grade I diastolic dysfunction (impaired relaxation). Right Ventricle: The right ventricular size is normal. No increase in right ventricular wall thickness. Right ventricular systolic function is normal. Left Atrium: Left atrial size was moderately dilated. Right Atrium: Right atrial size was normal in size. Pericardium: There is no evidence of pericardial effusion. Mitral Valve: Can not exclude vegetation. The mitral valve is degenerative in appearance. Mild flail of the middle segment of the anterior mitral valve leaflet. There is mild calcification of the mitral valve leaflet(s). Mild mitral annular calcification. Mild mitral valve regurgitation. Tricuspid Valve: The tricuspid valve is normal in structure. Tricuspid valve regurgitation is mild. Aortic Valve: The aortic valve is tricuspid. There is mild calcification of the aortic valve. There is mild thickening of the aortic valve. There is mild aortic valve  annular calcification. Aortic valve regurgitation is mild. Aortic regurgitation PHT measures 619 msec. Aortic valve sclerosis/calcification is present, without any evidence of aortic stenosis. Aortic valve peak gradient measures 6.7 mmHg. Pulmonic Valve: The pulmonic valve was normal in structure. Pulmonic valve regurgitation is trivial. Aorta: The aortic root is normal in size and structure. There is mild (Grade II) atheroma plaque involving the aortic root and ascending aorta. Venous: The inferior vena cava is dilated in size with greater than 50% respiratory variability, suggesting right atrial pressure of 8 mmHg. IAS/Shunts: The atrial septum is grossly normal.  LEFT VENTRICLE PLAX 2D LVIDd:         4.60 cm   Diastology LVIDs:  3.20 cm   LV e' lateral:   6.75 cm/s LV PW:         1.20 cm   LV E/e' lateral: 9.8 LV IVS:        1.30 cm LVOT diam:     2.20 cm LV SV:         77 LV SV Index:   34 LVOT Area:     3.80 cm  RIGHT VENTRICLE             IVC RV S prime:     16.00 cm/s  IVC diam: 1.90 cm TAPSE (M-mode): 2.2 cm LEFT ATRIUM             Index        RIGHT ATRIUM           Index LA diam:        4.10 cm 1.82 cm/m   RA Area:     16.70 cm LA Vol (A2C):   63.2 ml 28.08 ml/m  RA Volume:   33.00 ml  14.66 ml/m LA Vol (A4C):   53.6 ml 23.79 ml/m LA Biplane Vol: 54.8 ml 24.35 ml/m  AORTIC VALVE AV Area (Vmax): 2.79 cm AV Vmax:        129.00 cm/s AV Peak Grad:   6.7 mmHg LVOT Vmax:      94.80 cm/s LVOT Vmean:     60.300 cm/s LVOT VTI:       0.203 m AI PHT:         619 msec  AORTA Ao Root diam: 3.10 cm Ao Asc diam:  3.90 cm MITRAL VALVE                TRICUSPID VALVE MV Area (PHT): 3.37 cm     TR Peak grad:   9.9 mmHg MV Decel Time: 225 msec     TR Vmax:        157.00 cm/s MV E velocity: 65.90 cm/s MV A velocity: 113.00 cm/s  SHUNTS MV E/A ratio:  0.58         Systemic VTI:  0.20 m                             Systemic Diam: 2.20 cm Salena Negri MD Electronically signed by Salena Negri MD Signature Date/Time:  09/20/2023/5:30:21 PM    Final       LOS: 13 days    Elgin Lam, MD [facility] 09/22/2023, 8:26 AM   If 7PM-7AM, please contact night-coverage [email or web address]

## 2023-09-22 NOTE — Telephone Encounter (Signed)
 Encounter opened in error.

## 2023-09-23 ENCOUNTER — Telehealth: Payer: Self-pay | Admitting: Internal Medicine

## 2023-09-23 ENCOUNTER — Other Ambulatory Visit (HOSPITAL_COMMUNITY): Payer: Self-pay

## 2023-09-23 DIAGNOSIS — J189 Pneumonia, unspecified organism: Secondary | ICD-10-CM | POA: Insufficient documentation

## 2023-09-23 DIAGNOSIS — N1832 Chronic kidney disease, stage 3b: Secondary | ICD-10-CM | POA: Insufficient documentation

## 2023-09-23 DIAGNOSIS — R911 Solitary pulmonary nodule: Secondary | ICD-10-CM

## 2023-09-23 LAB — CBC
HCT: 38 % — ABNORMAL LOW (ref 39.0–52.0)
Hemoglobin: 12.6 g/dL — ABNORMAL LOW (ref 13.0–17.0)
MCH: 32.6 pg (ref 26.0–34.0)
MCHC: 33.2 g/dL (ref 30.0–36.0)
MCV: 98.2 fL (ref 80.0–100.0)
Platelets: 321 10*3/uL (ref 150–400)
RBC: 3.87 MIL/uL — ABNORMAL LOW (ref 4.22–5.81)
RDW: 16.6 % — ABNORMAL HIGH (ref 11.5–15.5)
WBC: 17.5 10*3/uL — ABNORMAL HIGH (ref 4.0–10.5)
nRBC: 0 % (ref 0.0–0.2)

## 2023-09-23 LAB — GLUCOSE, CAPILLARY
Glucose-Capillary: 116 mg/dL — ABNORMAL HIGH (ref 70–99)
Glucose-Capillary: 93 mg/dL (ref 70–99)
Glucose-Capillary: 127 mg/dL — ABNORMAL HIGH (ref 70–99)
Glucose-Capillary: 256 mg/dL — ABNORMAL HIGH (ref 70–99)

## 2023-09-23 MED ORDER — PREDNISONE 10 MG PO TABS
ORAL_TABLET | ORAL | 0 refills | Status: AC
  Filled 2023-09-23: qty 18, 9d supply, fill #0

## 2023-09-23 NOTE — Plan of Care (Signed)
  Problem: Education: Goal: Ability to describe self-care measures that may prevent or decrease complications (Diabetes Survival Skills Education) will improve Outcome: Progressing Goal: Individualized Educational Video(s) Outcome: Progressing   Problem: Coping: Goal: Ability to adjust to condition or change in health will improve Outcome: Progressing   Problem: Fluid Volume: Goal: Ability to maintain a balanced intake and output will improve Outcome: Progressing   Problem: Health Behavior/Discharge Planning: Goal: Ability to identify and utilize available resources and services will improve Outcome: Progressing Goal: Ability to manage health-related needs will improve Outcome: Progressing   Problem: Skin Integrity: Goal: Risk for impaired skin integrity will decrease Outcome: Progressing   Problem: Tissue Perfusion: Goal: Adequacy of tissue perfusion will improve Outcome: Progressing   Problem: Education: Goal: Knowledge of General Education information will improve Description: Including pain rating scale, medication(s)/side effects and non-pharmacologic comfort measures Outcome: Progressing   Problem: Health Behavior/Discharge Planning: Goal: Ability to manage health-related needs will improve Outcome: Progressing   Problem: Clinical Measurements: Goal: Ability to maintain clinical measurements within normal limits will improve Outcome: Progressing Goal: Will remain free from infection Outcome: Progressing Goal: Diagnostic test results will improve Outcome: Progressing Goal: Respiratory complications will improve Outcome: Progressing Goal: Cardiovascular complication will be avoided Outcome: Progressing   Problem: Activity: Goal: Risk for activity intolerance will decrease Outcome: Progressing   Problem: Nutrition: Goal: Adequate nutrition will be maintained Outcome: Progressing   Problem: Coping: Goal: Level of anxiety will decrease Outcome:  Progressing   Problem: Elimination: Goal: Will not experience complications related to bowel motility Outcome: Progressing Goal: Will not experience complications related to urinary retention Outcome: Progressing   Problem: Pain Management: Goal: General experience of comfort will improve Outcome: Progressing   Problem: Safety: Goal: Ability to remain free from injury will improve Outcome: Progressing   Problem: Skin Integrity: Goal: Risk for impaired skin integrity will decrease Outcome: Progressing

## 2023-09-23 NOTE — Progress Notes (Signed)
 Mobility Specialist Progress Note:   09/23/23 1446  Mobility  Activity Ambulated with assistance in hallway  Level of Assistance Standby assist, set-up cues, supervision of patient - no hands on  Assistive Device None  Distance Ambulated (ft) 420 ft  Activity Response Tolerated well  Mobility Referral Yes  Mobility visit 1 Mobility  Mobility Specialist Start Time (ACUTE ONLY) 1400  Mobility Specialist Stop Time (ACUTE ONLY) 1415  Mobility Specialist Time Calculation (min) (ACUTE ONLY) 15 min   Pre Mobility: 86 HR , 97% SpO2 3 L During Mobility: 92 HR , 84%-89% SpO2 3-4  Post Mobility:  92% SpO2 3 L  Pt received in bed, agreeable to mobility. Desat to 84% on 3 L during ambulation. Pursed lip breathing encouraged. SpO2 peaked at 89% on 4 L during ambulation. Pt denied any SOB or any discomfort during session, asx throughout. Pt returned to bed with call bell in reach and all needs met.    Brown Husband  Mobility Specialist Please contact via [employer] office at [phone]

## 2023-09-23 NOTE — Discharge Instructions (Addendum)
 Jonathan [name].,  You were in the hospital because of trouble breathing and low oxygen  levels related to pneumonia in addition to coughing up blood.  You are treated with antibiotics and steroids and your body was supported with supplemental oxygen  with eventual improvement in your symptoms.  During your workup, you are found to have a left upper lobe mass that is concerning for possible cancer.  It is very important that you follow-up with the pulmonologist as an outpatient to have this biopsied so we can get an accurate diagnosis for treatment.  You may have some continued coughing of blood, but if this becomes severe, please seek medical attention.  In preparation for your lung procedure, you will need to hold your Plavix : The pulmonologist can tell you when to restart your Plavix  after your procedure.

## 2023-09-23 NOTE — TOC Progression Note (Signed)
 Transition of Care (TOC) - Progression Note    Patient Details  Name: Jonathan [name]. MRN: [id number] Date of Birth: [birth year]  Transition of Care [facility]) CM/SW Contact  Nola Devere Hands, RN Phone Number: 09/23/2023, 12:21 PM  Clinical Narrative:    Patient has improved and will discharge home rather than LTAC. Case manager spoke with patient concerning recommendation for Home Health therapies. Patient states he has used [facility] in the past and wants to do so now. Referral called to [facility]iaison, [place]. Patient states he has support and assistance from his son and they have a caregiver at home for his wife. Has Bedside commode, walker and cane at home, uses oxygen  as well. No further needs identified.    Expected Discharge Plan: Home w Home Health Services Barriers to Discharge: No Barriers Identified  Expected Discharge Plan and Services   Discharge Planning Services: CM Consult Post Acute Care Choice: Home Health Living arrangements for the past 2 months: Single Family Home                 DME Arranged: N/A DME Agency: NA       HH Arranged: PT, OT HH Agency: [facility] Date HH Agency Contacted: 09/23/23 Time HH Agency Contacted: 1215 Representative spoke with at [facility] Agency: [place]   Social Determinants of Health (SDOH) Interventions SDOH Screenings   Food Insecurity: No Food Insecurity (09/09/2023)  Housing: Low Risk  (09/09/2023)  Transportation Needs: No Transportation Needs (09/09/2023)  Utilities: Not At Risk (09/09/2023)  Alcohol Screen: Low Risk  (07/20/2021)  Depression (PHQ2-9): Low Risk  (08/02/2023)  Financial Resource Strain: Low Risk  (07/20/2021)  Physical Activity: Inactive (07/20/2021)  Social Connections: Socially Integrated (09/17/2023)  Stress: No Stress Concern Present (07/20/2021)  Tobacco Use: Medium Risk (09/17/2023)    Readmission Risk Interventions     No data to display

## 2023-09-23 NOTE — Progress Notes (Addendum)
 NAME:  Jonathan [name]., MRN:  [id number], DOB:  January 12, 1936, LOS: 14 ADMISSION DATE:  09/08/2023, CONSULTATION DATE: 09/09/2023 REFERRING MD: [place] - EDP CHIEF COMPLAINT:  Hemoptysis   History of Present Illness:  88 year old man with PMHx significant for CAD s/p PCI x2 (12/2021), PVD s/p L SFA stent (2009), HTN, emphysema on nocturnal home O2 @ 2 L [place], DM-2, dyslipidemia, CKD-3b, and CHF (June 2024 EF 40-45%, G1DD) who is here today with chest discomfort and hemoptysis. He developed dry cough initially today and chest discomfort today without f/c/r, wheezing, DOE, SOB, LL edema, rash, URTI symptoms, N/V/D, or abd pain. When EMS arrived, he coughed fresh blood, large amount. This is never happened before. O2 was increased from 2 to 4 L [place]. He lost 50 Ib over 2 yrs intentionally and has good appetite. Denied night sweats, trauma, and previous hx of malignancy. Smoked for 30 yrs, 1 ppd, but quit 1978. Denied alcohol and illicit drug use.   Pertinent Medical History:  CAD s/p PCI x2 (12/2021), PVD s/p L SFA stent (2009), HTN, emphysema on nocturnal home O2 @ 2 L [place], DM-2, dyslipidemia, CKD-3b, and CHF (June 2024 EF 40-45%, G1DD)   Significant Hospital Events: Including procedures, antibiotic start and stop dates in addition to other pertinent events   12/22 - Admitted for hemoptysis.  CTA with no PE.  Noted rounded masslike opacity in the left upper lobe. 12/31 O2 requirements had improved and transferred out of ICU. Then in evening hrs required 40 liters and 80% 1/3 Hemoptysis last night after CT. SOB stable. Remains on HHFNC, 45L, 62% FiO2.  1//5 Significant improvement in oxygenation, now on 3.5L, denies any recurrent hemoptysis   Interim History / Subjective:  Feels great, no SOB.  Objective:  Blood pressure 126/70, pulse 68, temperature 98.4 F (36.9 C), temperature source Oral, resp. rate 20, height 6' 2 (1.88 m), weight 102.7 kg, SpO2 96%.    FiO2 (%):  [32 %] 32 %   Intake/Output  Summary (Last 24 hours) at 09/23/2023 1537 Last data filed at 09/23/2023 0600 Gross per 24 hour  Intake --  Output 1100 ml  Net -1100 ml   Filed Weights   09/21/23 0524 09/22/23 0341 09/23/23 0716  Weight: 101 kg 103.4 kg 102.7 kg   Physical Examination: General: elderly man lying in bed in NAD HEENT: [place]/AT, eyes anicteric  Neuro: awake, alert, moving all extremities CV: S1S2, RRR PULM:  breathing comfortably on 3L [place] GI: soft, NT Extremities: no edema, no cyanosis Skin: warm, dry, no rashes  Labs reviewed. Persistent leukocytosis.   Resolved Hospital Problem List:    Assessment & Plan:  Acute on chronic hypoxic resp failure due to community-acquired pneumonia that is most likely post-obstructive pneumonia.  Large spiculated lingular lingular mass -Given significant improvement in oxygenation bronchoscopy can be done on the outpatient setting, if patent is deemed stable from a medical standpoint. Tentative plan for inpatient vs outpatient 1/7. Baseline nocturnal home O2 @ 2 L .  Hemoptysis P: -Stable for discharge on Stiolto daily + albuterol  PRN -Has home concentrator and portable concentrator. His son will bring portable for transport home from hospital . -Recommend tapering steroids to prednisone  30 x 3 days, then 20mg  x 3 days, then 10mg  x 3 days, then stop. -Bronch scheduled 1/14 with Dr. Brenna. Stop plavix  now in preparation for next week's procedure. His stents are all years old. -He is expected to have intermittent hemoptysis; does not need to come to the  ED unless he is having increasing amounts over time or has >1/2 cup hemoptysis.   Recommendations discussed with primary team. Going home this afternoon.  Best Practice (right click and Reselect all SmartList Selections daily)   Per primary.  Leita SHAUNNA Gaskins, DO 09/23/23 4:09 PM [facility]  For contact information, see Amion. If no response to pager, please call PCCM consult pager. After  hours, 7PM- 7AM, please call Elink.

## 2023-09-23 NOTE — Progress Notes (Signed)
 Physical Therapy Treatment Patient Details Name: Jonathan [name]. MRN: [id number] DOB: [birth year] Today's Date: 09/23/2023   History of Present Illness 88 y.o. male presents to [facility] hospital on 09/08/2023 with chest pain and hemoptysis. CTA with LUL infiltrate suspicious for mass. Evening of 12/31 pt breathing requirements increased to 55L HHFNC and 85% FiO2. PMH includes CAD, PVD, HTN, emphysema, DMII, HLD, CKD III, CHF.    PT Comments  Patient progressing with ambulation distance.  Still reliant on O2 though much improved from admission.  Patient moving about without device though encouraged rollator use at d/c when out esp needing portable O2.  Patient agreeable to HHPT at d/c (and hopeful for home today).  PT will follow up if not d/c.     If plan is discharge home, recommend the following: Assistance with cooking/housework;Assist for transportation;Help with stairs or ramp for entrance   Can travel by private vehicle        Equipment Recommendations  None recommended by PT    Recommendations for Other Services       Precautions / Restrictions Precautions Precautions: Fall Precaution Comments: watch O2     Mobility  Bed Mobility Overal bed mobility: Modified Independent                  Transfers Overall transfer level: Modified independent Equipment used: None Transfers: Sit to/from Stand             General transfer comment: assist for lines    Ambulation/Gait Ambulation/Gait assistance: Supervision Gait Distance (Feet): 400 Feet Assistive device: None Gait Pattern/deviations: Step-through pattern, Wide base of support, Decreased stride length       General Gait Details: mild instability though no LOB; only using rail x 1; on RA initially. progressed to 3L O2   Stairs             Wheelchair Mobility     Tilt Bed    Modified Rankin (Stroke Patients Only)       Balance Overall balance assessment: Mild deficits observed, not formally  tested                                          Cognition Arousal: Alert Behavior During Therapy: WFL for tasks assessed/performed Overall Cognitive Status: Within Functional Limits for tasks assessed                                          Exercises      General Comments General comments (skin integrity, edema, etc.): SpO2 down to 81% though poor pleth with ambulation on RA standing still up to 84% then applied O2 at 2LPM and slowly up to 87% and then to 3L and up to 89-91%  Discussed using rollator to manage with O2 tank esp when out for MD appointments.  Also educated on checking SpO2 at home and keeping on 3L at home initially      Pertinent Vitals/Pain Pain Assessment Pain Assessment: No/denies pain    Home Living                          Prior Function            PT Goals (current goals can now be found in the  care plan section) Progress towards PT goals: Progressing toward goals    Frequency    Min 1X/week      PT Plan      Co-evaluation              AM-PAC PT 6 Clicks Mobility   Outcome Measure  Help needed turning from your back to your side while in a flat bed without using bedrails?: None Help needed moving from lying on your back to sitting on the side of a flat bed without using bedrails?: None Help needed moving to and from a bed to a chair (including a wheelchair)?: A Little Help needed standing up from a chair using your arms (e.g., wheelchair or bedside chair)?: None Help needed to walk in hospital room?: A Little Help needed climbing 3-5 steps with a railing? : Total 6 Click Score: 19    End of Session Equipment Utilized During Treatment: Oxygen  Activity Tolerance: Patient tolerated treatment well Patient left: in bed   PT Visit Diagnosis: Other abnormalities of gait and mobility (R26.89)     Time: 8861-8796 PT Time Calculation (min) (ACUTE ONLY): 25 min  Charges:    $Gait  Training: 8-22 mins $Self Care/Home Management: 8-22 PT General Charges $$ ACUTE PT VISIT: 1 Visit                     Micheline Portal, PT Acute Rehabilitation Services [phone] 09/23/2023    Montie Portal 09/23/2023, 1:20 PM

## 2023-09-23 NOTE — Discharge Summary (Addendum)
 Physician Discharge Summary   Patient: Jonathan [name]. MRN: [id number] DOB: 1936/06/22  Admit date:     09/08/2023  Discharge date: 09/23/23  Discharge Physician: Elgin Lam, MD   PCP: [name], MD   Recommendations at discharge:  PCP visit for hospital follow-up Pulmonology follow-up for bronchoscopy to evaluate LUL mass  Discharge Diagnoses: Principal Problem:   Hemoptysis Active Problems:   DMII (diabetes mellitus, type 2) (HCC)   Essential hypertension   Hyperlipidemia   CHF (congestive heart failure) (HCC)   COPD mixed type (HCC)   Lung mass   Acute respiratory failure with hypoxia (HCC)   CAP (community acquired pneumonia)   CKD stage 3b, GFR 30-44 ml/min (HCC)  Resolved Problems:   * No resolved hospital problems. *  Hospital Course: Jonathan [name]. is a 88 y.o. male with a history of CAD, PVD, hypertension, emphysema, chronic hypoxia on 2 L of oxygen , diabetes mellitus type 2, hyperlipidemia, CKD stage IIIb, CHF.  Patient presented secondary to chest discomfort and hemoptysis with associated respiratory failure.  Patient observed and managed in ICU by PCCM.  Patient also started empirically on antibiotics for presumed pneumonia.  Patient continued to room require significant rate of oxygen  supplementation to maintain oxygen  saturations in the 90s.  Patient is completed antibiotic course with continued/persistent severe hypoxia.  Steroids started.  Patient weaned to 3 L/min. Plan for outpatient bronchoscopy.  Assessment and Plan:  Acute on chronic respiratory failure with hypoxia Secondary to hemoptysis and complicated by possible in addition to left upper lobe consolidation concerning for possible mass.  Further complicated by underlying emphysema.  Patient requiring oxygen  supplementation via heated high flow nasal cannula with rates up to 60 L/min.  He has been managed on breathing treatments, steroids, hypertonic saline nebulizer treatments per pulmonology  recommendations. CT chest (1/2) re-demonstrates LUL mass concerning for bronchogenic neoplasm. Weaned down to 3.5 L/min this morning. No shunt identified on Transthoracic Echocardiogram.  Patient weaned to 3 L/min of oxygen .  Patient to discharge on a prednisone  taper.   Community acquired pneumonia Presumed diagnosis.  Patient treated empirically with ceftriaxone  and azithromycin  with completion of 5 days of antibiotics.   Hemoptysis Presumed secondary to pneumonia with coughing and complicated by aspirin  and Plavix  use.  CT chest also noted a masslike consolidation of unknown etiology.  Hemoptysis initially resolved, but patient had recurrence.  Hemoglobin remains stable.   Emphysema Noted and present on admission.  Patient is on nocturnal oxygen  at 2 L/min as an outpatient. Patient managed on Yupelri , Brovana , Pulmicort  inpatient. Resume home Stiolto on discharge.   Left upper lobe lung consolidation Masslike consolidation noted on CT angiogram of the chest from 09/08/2023.  Pulmonology plan for bronchoscopy as an outpatient.   CAD PAD History of PCI in 2023 and is status post left SFA stent 2009.  Patient was on Plavix  as an outpatient; patient states Aspirin  was discontinued prior to admission. Plavix  was held on admission secondary to submassive hemoptysis.  Plavix  restarted.  Plavix  held on discharge in anticipation of upcoming bronchoscopy.  Continue simvastatin .   Chronic combined systolic and diastolic heart failure Stable.  No evidence of acute heart failure at this time.  Likely not contributing to respiratory failure. Continue Toprol  XL and Entresto .   CKD stage IIIb Baseline creatinine is about 1.2-1.4.  Peak creatinine this admission of 1.55 on admission.  Creatinine improved toward baseline.   Primary hypertension Normotensive to hypertensive during this admission. Continue Entresto  and metoprolol  succinate  Leukocytosis No associated fevers.  Likely secondary to  steroids. Mild increase to peak of 19,000. Procalcitonin undetectable. WBC remains stable without clear etiology outside of steroids.    Consultants:  Pulmonology   Procedures:  Transthoracic Echocardiogram with bubble study  Disposition: Home health Diet recommendation: Cardiac diet   DISCHARGE MEDICATION: Allergies as of 09/23/2023   No Known Allergies      Medication List     PAUSE taking these medications    clopidogrel  75 MG tablet Wait to take this until your doctor or other care provider tells you to start again. Commonly known as: PLAVIX  Take 1 tablet (75 mg total) by mouth daily with breakfast.       TAKE these medications    albuterol  108 (90 Base) MCG/ACT inhaler Commonly known as: VENTOLIN  HFA USE 1 TO 2 INHALATIONS EVERY 6 HOURS AS NEEDED FOR WHEEZING OR SHORTNESS OF BREATH   empagliflozin  25 MG Tabs tablet Commonly known as: Jardiance  Take 1 tablet (25 mg total) by mouth daily before breakfast. For diabetes E11.65 and E 11.29   Entresto  24-26 MG Generic drug: sacubitril -valsartan  Take 1 tablet by mouth 2 (two) times daily.   escitalopram  10 MG tablet Commonly known as: Lexapro  Take 1 tablet (10 mg total) by mouth daily.   FreeStyle Freedom Lite w/Device Kit Use Freestyle Freedom Lite meter to check blood sugar twice daily. DX:E11.65   FREESTYLE LITE test strip Generic drug: glucose blood USE AS INSTRUCTED   furosemide  40 MG tablet Commonly known as: LASIX  Take 1 tablet (40 mg total) by mouth daily.   gabapentin  100 MG capsule Commonly known as: NEURONTIN  Take 200 mg by mouth daily.   metFORMIN  500 MG tablet Commonly known as: GLUCOPHAGE  Take 1 tablet (500 mg total) by mouth 2 (two) times daily with a meal.   metoprolol  200 MG 24 hr tablet Commonly known as: TOPROL -XL Take 100 mg by mouth daily.   multivitamin with minerals Tabs tablet Take 1 tablet by mouth daily.   OXYGEN  Inhale 2 L/min into the lungs at bedtime. PRN during  day   potassium chloride  SA 20 MEQ tablet Commonly known as: KLOR-CON  M Take 1 tablet (20 mEq total) by mouth daily.   predniSONE  10 MG tablet Commonly known as: DELTASONE  Take 3 tablets (30 mg total) by mouth daily with breakfast for 3 days, THEN 2 tablets (20 mg total) daily with breakfast for 3 days, THEN 1 tablet (10 mg total) daily with breakfast for 3 days. Start taking on: September 24, 2023   simvastatin  20 MG tablet Commonly known as: ZOCOR  Take 20 mg by mouth daily.   Stiolto Respimat  2.5-2.5 MCG/ACT Aers Generic drug: Tiotropium Bromide -Olodaterol Inhale 2 puffs into the lungs daily.   Sure Comfort Pen Needles 31G X 6 MM Misc Generic drug: Insulin  Pen Needle Use as directed   tamsulosin  0.4 MG Caps capsule Commonly known as: FLOMAX  Take 1 capsule (0.4 mg total) by mouth daily after supper.   Trulicity  3 MG/0.5ML Soaj Generic drug: Dulaglutide  INJECT 3 MG UNDER THE SKIN ONCE A WEEK               Durable Medical Equipment  (From admission, onward)           Start     Ordered   09/23/23 1518  For home use only DME oxygen   Once       Question Answer Comment  Length of Need Lifetime   Mode or (Route) Nasal cannula   Liters  per Minute 3   Frequency Continuous (stationary and portable oxygen  unit needed)   Oxygen  delivery system Gas      09/23/23 1517            Follow-up Information     [name], MD. Schedule an appointment as soon as possible for a visit in 1 week(s).   Specialty: Internal Medicine Why: For hospital follow-up Contact information: [address] [place] [address] [phone]                Discharge Exam: BP 126/70 (BP Location: Left Arm)   Pulse 68   Temp 98.4 F (36.9 C) (Oral)   Resp 20   Ht 6' 2 (1.88 m)   Wt 102.7 kg   SpO2 96%   BMI 29.08 kg/m   General exam: Appears calm and comfortable Respiratory system: Clear to auscultation. Respiratory effort normal. Cardiovascular  system: S1 & S2 heard, RRR. No murmurs, rubs, gallops or clicks. Gastrointestinal system: Abdomen is nondistended, soft and nontender. Normal bowel sounds heard. Central nervous system: Alert and oriented. No focal neurological deficits. Musculoskeletal: No edema. No calf tenderness Skin: No cyanosis. No rashes Psychiatry: Judgement and insight appear normal. Mood & affect appropriate.   Condition at discharge: stable  The results of significant diagnostics from this hospitalization (including imaging, microbiology, ancillary and laboratory) are listed below for reference.   Imaging Studies: ECHOCARDIOGRAM COMPLETE BUBBLE STUDY Result Date: 09/20/2023    ECHOCARDIOGRAM REPORT   Patient Name:   Cortland Crehan. Date of Exam: 09/20/2023 Medical Rec #:  [id number]           Height:       74.0 in Accession #:    [id number]          Weight:       217.2 lb Date of Birth:  09-05-1936            BSA:          2.251 m Patient Age:    87 years            BP:           138/69 mmHg Patient Gender: M                   HR:           50 bpm. Exam Location:  Inpatient Procedure: 2D Echo, Cardiac Doppler, Color Doppler, Saline Contrast Bubble Study            and Intracardiac Opacification Agent Indications:    Acute Resp failure  History:        Patient has prior history of Echocardiogram examinations, most                 recent 02/21/2023. CHF, CAD, COPD; Risk Factors:Hypertension,                 Diabetes and Dyslipidemia.  Sonographer:    Thea Norlander Referring Phys: [id number] PRAVEEN MANNAM IMPRESSIONS  1. Left ventricular ejection fraction, by estimation, is 55 to 60%. The left ventricle has normal function. The left ventricle has no regional wall motion abnormalities. There is mild concentric left ventricular hypertrophy. Left ventricular diastolic parameters are consistent with Grade I diastolic dysfunction (impaired relaxation).  2. Right ventricular systolic function is normal. The right ventricular size is  normal.  3. Left atrial size was moderately dilated.  4. Mild flail of anterior leaflet.  5.  The mitral valve is degenerative. Mild mitral valve regurgitation.  6. The aortic valve is tricuspid. There is mild calcification of the aortic valve. There is mild thickening of the aortic valve. Aortic valve regurgitation is mild. Aortic valve sclerosis/calcification is present, without any evidence of aortic stenosis.  7. There is mild (Grade II) atheroma plaque involving the aortic root and ascending aorta.  8. The inferior vena cava is dilated in size with >50% respiratory variability, suggesting right atrial pressure of 8 mmHg. FINDINGS  Left Ventricle: Left ventricular ejection fraction, by estimation, is 55 to 60%. The left ventricle has normal function. The left ventricle has no regional wall motion abnormalities. Definity  contrast agent was given IV to delineate the left ventricular  endocardial borders. The left ventricular internal cavity size was normal in size. There is mild concentric left ventricular hypertrophy. Left ventricular diastolic parameters are consistent with Grade I diastolic dysfunction (impaired relaxation). Right Ventricle: The right ventricular size is normal. No increase in right ventricular wall thickness. Right ventricular systolic function is normal. Left Atrium: Left atrial size was moderately dilated. Right Atrium: Right atrial size was normal in size. Pericardium: There is no evidence of pericardial effusion. Mitral Valve: Can not exclude vegetation. The mitral valve is degenerative in appearance. Mild flail of the middle segment of the anterior mitral valve leaflet. There is mild calcification of the mitral valve leaflet(s). Mild mitral annular calcification. Mild mitral valve regurgitation. Tricuspid Valve: The tricuspid valve is normal in structure. Tricuspid valve regurgitation is mild. Aortic Valve: The aortic valve is tricuspid. There is mild calcification of the aortic valve.  There is mild thickening of the aortic valve. There is mild aortic valve annular calcification. Aortic valve regurgitation is mild. Aortic regurgitation PHT measures 619 msec. Aortic valve sclerosis/calcification is present, without any evidence of aortic stenosis. Aortic valve peak gradient measures 6.7 mmHg. Pulmonic Valve: The pulmonic valve was normal in structure. Pulmonic valve regurgitation is trivial. Aorta: The aortic root is normal in size and structure. There is mild (Grade II) atheroma plaque involving the aortic root and ascending aorta. Venous: The inferior vena cava is dilated in size with greater than 50% respiratory variability, suggesting right atrial pressure of 8 mmHg. IAS/Shunts: The atrial septum is grossly normal.  LEFT VENTRICLE PLAX 2D LVIDd:         4.60 cm   Diastology LVIDs:         3.20 cm   LV e' lateral:   6.75 cm/s LV PW:         1.20 cm   LV E/e' lateral: 9.8 LV IVS:        1.30 cm LVOT diam:     2.20 cm LV SV:         77 LV SV Index:   34 LVOT Area:     3.80 cm  RIGHT VENTRICLE             IVC RV S prime:     16.00 cm/s  IVC diam: 1.90 cm TAPSE (M-mode): 2.2 cm LEFT ATRIUM             Index        RIGHT ATRIUM           Index LA diam:        4.10 cm 1.82 cm/m   RA Area:     16.70 cm LA Vol (A2C):   63.2 ml 28.08 ml/m  RA Volume:   33.00 ml  14.66 ml/m LA  Vol (A4C):   53.6 ml 23.79 ml/m LA Biplane Vol: 54.8 ml 24.35 ml/m  AORTIC VALVE AV Area (Vmax): 2.79 cm AV Vmax:        129.00 cm/s AV Peak Grad:   6.7 mmHg LVOT Vmax:      94.80 cm/s LVOT Vmean:     60.300 cm/s LVOT VTI:       0.203 m AI PHT:         619 msec  AORTA Ao Root diam: 3.10 cm Ao Asc diam:  3.90 cm MITRAL VALVE                TRICUSPID VALVE MV Area (PHT): 3.37 cm     TR Peak grad:   9.9 mmHg MV Decel Time: 225 msec     TR Vmax:        157.00 cm/s MV E velocity: 65.90 cm/s MV A velocity: 113.00 cm/s  SHUNTS MV E/A ratio:  0.58         Systemic VTI:  0.20 m                             Systemic Diam: 2.20 cm Salena Negri MD Electronically signed by Salena Negri MD Signature Date/Time: 09/20/2023/5:30:21 PM    Final    CT CHEST WO CONTRAST Result Date: 09/20/2023 CLINICAL DATA:  88 year old male with history of shortness of breath and cough. EXAM: CT CHEST WITHOUT CONTRAST TECHNIQUE: Multidetector CT imaging of the chest was performed following the standard protocol without IV contrast. RADIATION DOSE REDUCTION: This exam was performed according to the departmental dose-optimization program which includes automated exposure control, adjustment of the mA and/or kV according to patient size and/or use of iterative reconstruction technique. COMPARISON:  Chest CT 09/08/2023. FINDINGS: Cardiovascular: Heart size is mildly enlarged. Small amount of pericardial fluid and/or thickening, unlikely to be of any hemodynamic significance at this time. No pericardial calcification. There is aortic atherosclerosis, as well as atherosclerosis of the great vessels of the mediastinum and the coronary arteries, including calcified atherosclerotic plaque in the left main, left anterior descending, left circumflex and right coronary arteries. Mediastinum/Nodes: No pathologically enlarged mediastinal or hilar lymph nodes. Please note that accurate exclusion of hilar adenopathy is limited on noncontrast CT scans. Esophagus is unremarkable in appearance. No axillary lymphadenopathy. Lungs/Pleura: Some of the airspace consolidation noted in the left upper lobe on the prior study has resolved. However, there continues to be a aggressive appearing mass-like area in the medial aspect of the left upper lobe which has macrolobulated and spiculated margins measuring approximately 4.7 x 4.1 cm (axial image 78 of series 2) making broad contact with the pleura medially, highly concerning for primary bronchogenic neoplasm. Patchy areas of ground-glass attenuation and septal thickening persist in the periphery of the left upper lobe, likely persistent  postobstructive pneumonitis. 6 mm nodule associated with the minor fissure (axial image 73 of series 3), unchanged in retrospect compared to the prior examination. Trace right pleural effusion. No left pleural effusion. Diffuse bronchial wall thickening with moderate centrilobular and paraseptal emphysema. Upper Abdomen: Aortic atherosclerosis. Low-attenuation lesion in the medial aspect of the upper pole of the right kidney (axial image 151 of series 2), incompletely characterized on today's noncontrast CT examination, but statistically likely a cyst (no imaging follow-up recommended). Musculoskeletal: There are no aggressive appearing lytic or blastic lesions noted in the visualized portions of the skeleton. IMPRESSION: 1. Persistent aggressive appearing mass in  the central aspect of the left upper lobe highly concerning for primary bronchogenic neoplasm. Surrounding postobstructive pneumonia has improved, with residual areas of postobstructive pneumonitis in the left upper lobe noted on today's examination. 2. Trace right pleural effusion. 3. Aortic atherosclerosis, in addition to left main and three-vessel coronary artery disease. 4. Trace amount of pericardial fluid and/or thickening, unlikely to be of any hemodynamic significance at this time. 5. Diffuse bronchial wall thickening with moderate centrilobular and paraseptal emphysema; imaging findings suggestive of underlying COPD. Aortic Atherosclerosis (ICD10-I70.0) and Emphysema (ICD10-J43.9). Electronically Signed   By: Toribio Aye M.D.   On: 09/20/2023 08:02   DG CHEST PORT 1 VIEW Result Date: 09/18/2023 CLINICAL DATA:  Hypoxia. EXAM: PORTABLE CHEST 1 VIEW COMPARISON:  Chest radiograph 09/16/2023. FINDINGS: Stable cardiomediastinal silhouette. There is a moderate layering pleural effusion with associated atelectasis/airspace disease on the left, similar or slightly increased compared to prior exam. There is mild right basilar atelectasis, increased  from prior exam. No pneumothorax. IMPRESSION: 1. Moderate layering pleural effusion with associated atelectasis/airspace disease on the left, similar or slightly increased compared to prior exam. 2. Mild right basilar atelectasis, increased from prior exam. Electronically Signed   By: Norman Hopper M.D.   On: 09/18/2023 10:01   DG Chest Port 1 View Result Date: 09/16/2023 CLINICAL DATA:  Acute respiratory failure. EXAM: PORTABLE CHEST 1 VIEW COMPARISON:  Chest x-ray from yesterday. FINDINGS: Stable cardiomediastinal silhouette. Similar patchy consolidation and interstitial opacity in the left upper and lower lobes. Clear right lung. No pleural effusion or pneumothorax. No acute osseous abnormality. IMPRESSION: 1. Similar left upper and lower lobe pneumonia. Electronically Signed   By: Elsie ONEIDA Shoulder M.D.   On: 09/16/2023 09:40   DG Chest Port 1 View Result Date: 09/15/2023 CLINICAL DATA:  [id number] Acute respiratory failure (HCC) 5626 EXAM: PORTABLE CHEST 1 VIEW COMPARISON:  09/12/2023 FINDINGS: Patient is rotated. Stable heart size. Aortic atherosclerosis. Patchy airspace opacity within the left mid to lower lung field with slightly improving aeration. Right lung is clear. No pleural effusion or pneumothorax. IMPRESSION: Patchy airspace opacity within the left mid to lower lung field with slightly improving aeration. Electronically Signed   By: Mabel Converse D.O.   On: 09/15/2023 12:40   DG CHEST PORT 1 VIEW Result Date: 09/12/2023 CLINICAL DATA:  [id number] with pneumonia and cough. EXAM: PORTABLE CHEST 1 VIEW COMPARISON:  Portable chest 09/10/2023 at 9:32 a.m. FINDINGS: 6:15 p.m. Patchy dense consolidation continues to be seen in the left mid and lower lung field overlying a small left pleural effusion. There is improved aeration in the upper third of the left lung with interval clearing. Right lung remains clear.  Both lungs are mildly emphysematous. There is mild cardiomegaly without evidence of CHF. The  mediastinum is stable. There is aortic calcification and mild tortuosity. No new osseous abnormality. IMPRESSION: 1. Improved aeration in the upper third of the left lung with interval clearing. 2. Patchy dense consolidation continues to be seen in the left mid and lower lung field overlying a small left pleural effusion. 3. Emphysema. 4. Aortic atherosclerosis. Electronically Signed   By: Francis Quam M.D.   On: 09/12/2023 20:25   DG CHEST PORT 1 VIEW Addendum Date: 09/10/2023 ADDENDUM REPORT: 09/10/2023 13:21 ADDENDUM: Study discussed by telephone with Dr. ADINE GIFT on 09/10/2023 at 13:19 hours. Electronically Signed   By: VEAR Hurst M.D.   On: 09/10/2023 13:21   Result Date: 09/10/2023 CLINICAL DATA:  88 year old male with pneumonia. EXAM: PORTABLE CHEST  1 VIEW COMPARISON:  Portable chest 09/08/2023 and earlier. FINDINGS: Portable AP upright view at 0932 hours. Progressed and now subtotal opacification of the left lung, most pronounced at the level of the hilum and the left lung base. Left upper lung new reticulonodular opacity. Some leftward shift of the mediastinum since the previous exam. Calcified aortic atherosclerosis. No pneumothorax. Right lung appears stable and negative. Negative visible bowel gas. Stable visualized osseous structures. IMPRESSION: 1. Substantially progressed combined left lung opacification and volume loss since 09/08/2023, with some new leftward mediastinal shift. This could be progressive multilobar Pneumonia and/or Mucous plugging. 2. Right lung remains negative. Electronically Signed: By: VEAR Hurst M.D. On: 09/10/2023 12:52   DG Chest Portable 1 View Result Date: 09/08/2023 CLINICAL DATA:  Hemoptysis. Shortness of breath. Cold symptoms for a few days. EXAM: PORTABLE CHEST 1 VIEW COMPARISON:  02/21/2023 FINDINGS: Mild cardiac enlargement. Focal airspace infiltration in the left mid lung likely represents an area of pneumonia. This is new since prior study. Right lung is  clear. No pleural effusions. No pneumothorax. Calcification of the aorta. Degenerative changes in the spine. IMPRESSION: New focal area of airspace disease in the left mid lung, likely pneumonia. Follow-up to resolution is recommended to exclude underlying focal lesion. Electronically Signed   By: Elsie Gravely M.D.   On: 09/08/2023 23:32   CT Angio Chest PE W and/or Wo Contrast Result Date: 09/08/2023 CLINICAL DATA:  Pulmonary embolism (PE) suspected, high prob. Hemoptysis. EXAM: CT ANGIOGRAPHY CHEST WITH CONTRAST TECHNIQUE: Multidetector CT imaging of the chest was performed using the standard protocol during bolus administration of intravenous contrast. Multiplanar CT image reconstructions and MIPs were obtained to evaluate the vascular anatomy. RADIATION DOSE REDUCTION: This exam was performed according to the departmental dose-optimization program which includes automated exposure control, adjustment of the mA and/or kV according to patient size and/or use of iterative reconstruction technique. CONTRAST:  75mL OMNIPAQUE  IOHEXOL  350 MG/ML SOLN COMPARISON:  None Available. FINDINGS: Cardiovascular: No filling defects in the pulmonary arteries to suggest pulmonary emboli. Heart is normal size. Aorta is normal caliber. Extensive coronary artery and aortic atherosclerosis. Mediastinum/Nodes: No mediastinal, hilar, or axillary adenopathy. Trachea and thyroid  unremarkable. Esophagus is fluid-filled. Lungs/Pleura: Rounded area of masslike consolidation in the left upper lobe. Favor pneumonia, but this warrants follow-up after treatment to exclude malignancy. No confluent opacity in the right lung. Interstitial thickening and ground-glass opacities in the lung bases bilaterally, right greater than left, favor scarring/fibrosis. No effusions. Upper Abdomen: Mild distention of the stomach with food debris/fluid/air. Musculoskeletal: Chest wall soft tissues are unremarkable. No acute bony abnormality. Review of the  MIP images confirms the above findings. IMPRESSION: Rounded masslike area consolidation in the left upper lobe, favor pneumonia. Followup CT is recommended in 3-4 weeks following trial of antibiotic therapy to ensure resolution and exclude underlying malignancy. Fluid-filled esophagus could be related to dysmotility or reflux. Mild distention of the stomach with gas and fluid/debris. Diffuse coronary artery disease. Aortic Atherosclerosis (ICD10-I70.0). Electronically Signed   By: Franky Crease M.D.   On: 09/08/2023 23:22    Microbiology: Results for orders placed or performed during the hospital encounter of 09/08/23  Resp panel by RT-PCR (RSV, Flu A&B, Covid) Anterior Nasal Swab     Status: None   Collection Time: 09/08/23 11:06 PM   Specimen: Anterior Nasal Swab  Result Value Ref Range Status   SARS Coronavirus 2 by RT PCR NEGATIVE NEGATIVE Final   Influenza A by PCR NEGATIVE NEGATIVE Final  Influenza B by PCR NEGATIVE NEGATIVE Final    Comment: (NOTE) The Xpert Xpress SARS-CoV-2/FLU/RSV plus assay is intended as an aid in the diagnosis of influenza from Nasopharyngeal swab specimens and should not be used as a sole basis for treatment. Nasal washings and aspirates are unacceptable for Xpert Xpress SARS-CoV-2/FLU/RSV testing.  Fact Sheet for Patients: bloggercourse.com  Fact Sheet for Healthcare Providers: seriousbroker.it  This test is not yet approved or cleared by the [place]  FDA and has been authorized for detection and/or diagnosis of SARS-CoV-2 by FDA under an Emergency Use Authorization (EUA). This EUA will remain in effect (meaning this test can be used) for the duration of the COVID-19 declaration under Section 564(b)(1) of the Act, 21 U.S.C. section 360bbb-3(b)(1), unless the authorization is terminated or revoked.     Resp Syncytial Virus by PCR NEGATIVE NEGATIVE Final    Comment: (NOTE) Fact Sheet for  Patients: bloggercourse.com  Fact Sheet for Healthcare Providers: seriousbroker.it  This test is not yet approved or cleared by the [place]  FDA and has been authorized for detection and/or diagnosis of SARS-CoV-2 by FDA under an Emergency Use Authorization (EUA). This EUA will remain in effect (meaning this test can be used) for the duration of the COVID-19 declaration under Section 564(b)(1) of the Act, 21 U.S.C. section 360bbb-3(b)(1), unless the authorization is terminated or revoked.  Performed at [facility], [address], [place], [place] [address]   Blood culture (routine x 2)     Status: None   Collection Time: 09/09/23 12:02 AM   Specimen: BLOOD RIGHT HAND  Result Value Ref Range Status   Specimen Description BLOOD RIGHT HAND  Final   Special Requests   Final    AEROBIC BOTTLE ONLY Blood Culture results may not be optimal due to an inadequate volume of blood received in culture bottles   Culture   Final    NO GROWTH 5 DAYS Performed at [facility], [address], [place], [place] [address]    Report Status 09/14/2023 FINAL  Final  Blood culture (routine x 2)     Status: None   Collection Time: 09/09/23 12:16 AM   Specimen: BLOOD LEFT HAND  Result Value Ref Range Status   Specimen Description BLOOD LEFT HAND  Final   Special Requests   Final    BOTTLES DRAWN AEROBIC AND ANAEROBIC Blood Culture adequate volume   Culture   Final    NO GROWTH 5 DAYS Performed at [facility], [address], [place], [place] [address]    Report Status 09/14/2023 FINAL  Final  MRSA Next Gen by PCR, Nasal     Status: None   Collection Time: 09/09/23  1:15 AM   Specimen: Nasal Mucosa; Nasal Swab  Result Value Ref Range Status   MRSA by PCR Next Gen NOT DETECTED NOT DETECTED Final    Comment: (NOTE) The GeneXpert MRSA Assay (FDA approved for NASAL specimens only), is one component of a comprehensive MRSA  colonization surveillance program. It is not intended to diagnose MRSA infection nor to guide or monitor treatment for MRSA infections. Test performance is not FDA approved in patients less than 75 years old. Performed at [facility], [address] [place], [place] [address]   MRSA Next Gen by PCR, Nasal     Status: None   Collection Time: 09/18/23 12:01 AM   Specimen: Nasal Mucosa; Nasal Swab  Result Value Ref Range Status   MRSA  by PCR Next Gen NOT DETECTED NOT DETECTED Final    Comment: (NOTE) The GeneXpert MRSA Assay (FDA approved for NASAL specimens only), is one component of a comprehensive MRSA colonization surveillance program. It is not intended to diagnose MRSA infection nor to guide or monitor treatment for MRSA infections. Test performance is not FDA approved in patients less than 37 years old. Performed at [facility], [address] [place], [place] [address]     Labs: CBC: Recent Labs  Lab 09/18/23 0845 09/20/23 1024 09/21/23 0846 09/22/23 0155 09/23/23 0204  WBC 16.5* 19.0* 14.5* 17.3* 17.5*  HGB 14.8 14.8 13.5 12.6* 12.6*  HCT 44.5 43.9 40.3 37.6* 38.0*  MCV 95.7 96.9 97.6 97.9 98.2  PLT 383 376 326 311 321   Basic Metabolic Panel: Recent Labs  Lab 09/17/23 0341 09/18/23 0226  NA 136 134*  K 4.3 4.5  CL 104 105  CO2 25 21*  GLUCOSE 173* 122*  BUN 34* 33*  CREATININE 1.36* 1.33*  CALCIUM  8.5* 8.4*    CBG: Recent Labs  Lab 09/22/23 1627 09/22/23 2126 09/23/23 0546 09/23/23 0751 09/23/23 1134  GLUCAP 332* 215* 116* 93 127*    Discharge time spent: 35 minutes.  Signed: Elgin Lam, MD [facility] 09/23/2023

## 2023-09-23 NOTE — Plan of Care (Signed)
  Problem: Coping: Goal: Ability to adjust to condition or change in health will improve Outcome: Progressing   Problem: Fluid Volume: Goal: Ability to maintain a balanced intake and output will improve Outcome: Progressing   Problem: Nutritional: Goal: Maintenance of adequate nutrition will improve Outcome: Progressing Goal: Progress toward achieving an optimal weight will improve Outcome: Progressing   Problem: Skin Integrity: Goal: Risk for impaired skin integrity will decrease Outcome: Progressing

## 2023-09-23 NOTE — H&P (View-Only) (Signed)
 NAME:  Jonathan [name]., MRN:  [id number], DOB:  January 12, 1936, LOS: 14 ADMISSION DATE:  09/08/2023, CONSULTATION DATE: 09/09/2023 REFERRING MD: Rancour - EDP CHIEF COMPLAINT:  Hemoptysis   History of Present Illness:  88 year old man with PMHx significant for CAD s/p PCI x2 (12/2021), PVD s/p L SFA stent (2009), HTN, emphysema on nocturnal home O2 @ 2 L [place], DM-2, dyslipidemia, CKD-3b, and CHF (June 2024 EF 40-45%, G1DD) who is here today with chest discomfort and hemoptysis. He developed dry cough initially today and chest discomfort today without f/c/r, wheezing, DOE, SOB, LL edema, rash, URTI symptoms, N/V/D, or abd pain. When EMS arrived, he coughed fresh blood, large amount. This is never happened before. O2 was increased from 2 to 4 L [place]. He lost 50 Ib over 2 yrs intentionally and has good appetite. Denied night sweats, trauma, and previous hx of malignancy. Smoked for 30 yrs, 1 ppd, but quit 1978. Denied alcohol and illicit drug use.   Pertinent Medical History:  CAD s/p PCI x2 (12/2021), PVD s/p L SFA stent (2009), HTN, emphysema on nocturnal home O2 @ 2 L [place], DM-2, dyslipidemia, CKD-3b, and CHF (June 2024 EF 40-45%, G1DD)   Significant Hospital Events: Including procedures, antibiotic start and stop dates in addition to other pertinent events   12/22 - Admitted for hemoptysis.  CTA with no PE.  Noted rounded masslike opacity in the left upper lobe. 12/31 O2 requirements had improved and transferred out of ICU. Then in evening hrs required 40 liters and 80% 1/3 Hemoptysis last night after CT. SOB stable. Remains on HHFNC, 45L, 62% FiO2.  1//5 Significant improvement in oxygenation, now on 3.5L, denies any recurrent hemoptysis   Interim History / Subjective:  Feels great, no SOB.  Objective:  Blood pressure 126/70, pulse 68, temperature 98.4 F (36.9 C), temperature source Oral, resp. rate 20, height 6' 2 (1.88 m), weight 102.7 kg, SpO2 96%.    FiO2 (%):  [32 %] 32 %   Intake/Output  Summary (Last 24 hours) at 09/23/2023 1537 Last data filed at 09/23/2023 0600 Gross per 24 hour  Intake --  Output 1100 ml  Net -1100 ml   Filed Weights   09/21/23 0524 09/22/23 0341 09/23/23 0716  Weight: 101 kg 103.4 kg 102.7 kg   Physical Examination: General: elderly man lying in bed in NAD HEENT: [place]/AT, eyes anicteric  Neuro: awake, alert, moving all extremities CV: S1S2, RRR PULM:  breathing comfortably on 3L [place] GI: soft, NT Extremities: no edema, no cyanosis Skin: warm, dry, no rashes  Labs reviewed. Persistent leukocytosis.   Resolved Hospital Problem List:    Assessment & Plan:  Acute on chronic hypoxic resp failure due to community-acquired pneumonia that is most likely post-obstructive pneumonia.  Large spiculated lingular lingular mass -Given significant improvement in oxygenation bronchoscopy can be done on the outpatient setting, if patent is deemed stable from a medical standpoint. Tentative plan for inpatient vs outpatient 1/7. Baseline nocturnal home O2 @ 2 L .  Hemoptysis P: -Stable for discharge on Stiolto daily + albuterol  PRN -Has home concentrator and portable concentrator. His son will bring portable for transport home from hospital . -Recommend tapering steroids to prednisone  30 x 3 days, then 20mg  x 3 days, then 10mg  x 3 days, then stop. -Bronch scheduled 1/14 with Dr. Brenna. Stop plavix  now in preparation for next week's procedure. His stents are all years old. -He is expected to have intermittent hemoptysis; does not need to come to the  ED unless he is having increasing amounts over time or has >1/2 cup hemoptysis.   Recommendations discussed with primary team. Going home this afternoon.  Best Practice (right click and Reselect all SmartList Selections daily)   Per primary.  Leita SHAUNNA Gaskins, DO 09/23/23 4:09 PM [facility]  For contact information, see Amion. If no response to pager, please call PCCM consult pager. After  hours, 7PM- 7AM, please call Elink.

## 2023-09-23 NOTE — Progress Notes (Signed)
 SATURATION QUALIFICATIONS: (This note is used to comply with regulatory documentation for home oxygen )  Patient Saturations on Room Air at Rest = 94%  Patient Saturations on Room Air while Ambulating = 84%  Patient Saturations on 3 Liters of oxygen  while Ambulating = 91%  Please briefly explain why patient needs home oxygen :patient with hypoxia on RA with ambulation.  Micheline Portal, PT Acute Rehabilitation Services Office:[phone] 09/23/2023

## 2023-09-24 ENCOUNTER — Telehealth: Payer: Self-pay | Admitting: *Deleted

## 2023-09-24 ENCOUNTER — Telehealth: Payer: Self-pay | Admitting: Pulmonary Disease

## 2023-09-24 NOTE — Patient Instructions (Signed)
 Visit Information  Thank you for taking time to visit with me today. Please don't hesitate to contact me if I can be of assistance to you before our next scheduled telephone appointment.  Telephone follow up appointment with care management team member scheduled for:  Wednesday 10/02/23 at 1:30 pm  Please call the care guide team at [phone] if you need to cancel or reschedule your appointment.   Patient Goals/Self-Care Activities: Participate in Transition of Care Program/Attend TOC scheduled calls Take all medications as prescribed Attend all scheduled provider appointments Perform all self care activities independently  Call provider office for new concerns or questions  Continue pacing activity as your recuperation from recent surgery continues Continue using home oxygen  as prescribed Continue working with the home health team that is involved in your care If you believe your condition is getting worse- contact your care providers (doctors) promptly- reaching out to your doctor early when you have concerns can prevent you from having to go to the hospital  Following is a copy of your care plan:   Goals Addressed             This Visit's Progress    TOC 30-day Program Care Plan   On track    Current Barriers:  Medication access Full medication review completed 09/24/23: patient reported he is not/ has not been taking potassium; verified through extensive efforts this was prescribed at time of 02/22/23 hospital discharge- provided to patient by [facility] pharmacy- with no refills; sent care coordination outreach to cardiology provider through review of EHR, requesting clarification; noted patient's most recent postassium level is 4.5 (09/23/23)  Provider appointments Patient has been instructed to attend 10/01/23 brochoscopy with Dr. Brenna at the [facility]; No address, time, or phone number was included on printed AVS: patient does not know what or where/ what time he should go:   MULTIPLE care coordination outreaches completed with [facility]/ [facility] / pulmonary provider (Dr. Icard)/ eventual endoscopy department at [facility]; eventually and with much effort obtained information that patient is to go to the main hospital facility ([facility]) at 9:00 am on 10/01/23: confirmed that he will be contacted by staff closer to 10/01/23-- if patient has questions in the mean time- he is to call and ask for Shannon/ respiratory coordinator at the endoscopy department [phone]) and/ or the short stay department at [phone]) Home Health services Confirmed PT/OT home health services referral sent to [facility]: patient has not heard from [facility], and there is no information included for patient on his discharge instructions; care coordination outreach completed with [facility]: [phone]: spoke with [facility]; prior to signing of TOC note: confirmed patient has scheduled PT initial home visit for tomorrow 09/25/23   Hospitalized 09/09/23- 09/22/22: hemoptysis with new diagnosis of LUL mass; multiple hospitalizations over last 6 months:  3 total with ED visits as well  Independent in self-care; resides with supportive spouse; no SDOH barriers identified  RNCM Clinical Goal(s):  Patient will work with the Care Management team over the next 30 days to address Transition of Care Barriers: Medication access Medication Management Provider appointments Home Health services through collaboration with RN Care manager, provider, and care team.   Interventions: Evaluation of current treatment plan related to  self management and patient's adherence to plan as established by provider  Transitions of Care:  New goal. 09/24/23 Labs reviewed recent potassium values at time of most recent hospitalization, after discovering that patient is not taking potassium post- his prior admission in  June: most recent value: 4.5 (09/18/23)  Durable Medical Equipment (DME) needs assessed with  patient/caregiver Doctor Visits  - discussed the importance of doctor visits Communication with multiple providers  re: discrepancy of medication list- needs clarification around whether he should/ should not be taking potassium- currently on his med list but he is not taking; initiation of home health services; details of upcoming bronchoscopy procedure to be completed on 10/01/23 Contacted provider for patient needs as above Arranged PCP follow-up within 7 days (Care Guide Scheduled) Contacted Health RN/OT/PT - [facility]: spoke with [facility]; she confirms patient will have initial PT home visit tomorrow 09/25/23 Contacted patient back to update him on all of the care coordination outreaches completed today/ provide updates as above-- still waiting for clarification around whether or not he should be taking potassium or not  Patient Goals/Self-Care Activities: Participate in Transition of Care Program/Attend TOC scheduled calls Take all medications as prescribed Attend all scheduled provider appointments Perform all self care activities independently  Call provider office for new concerns or questions  Continue pacing activity as your recuperation from recent surgery continues Continue using home oxygen  as prescribed Continue working with the home health team that is involved in your care If you believe your condition is getting worse- contact your care providers (doctors) promptly- reaching out to your doctor early when you have concerns can prevent you from having to go to the hospital  Follow Up Plan:  Telephone follow up appointment with care management team member scheduled for:  Wednesday 10/02/23 at 1:30 pm         Patient verbalizes understanding of instructions and care plan provided today and agrees to view in MyChart. Active MyChart status and patient understanding of how to access instructions and care plan via MyChart confirmed with patient.     If you are experiencing a Mental  Health or Behavioral Health Crisis or need someone to talk to, please call the Suicide and Crisis Lifeline: 988 call the [place]  National Suicide Prevention Lifeline: 9058415607 or TTY: (936)140-7358 TTY (412)699-6254) to talk to a trained counselor call 1-800-273-TALK (toll free, 24 hour hotline) go to [facility] [address] [place] [phone]) call the [name] Crisis Line: 9711282860 call 911

## 2023-09-24 NOTE — Transitions of Care (Post Inpatient/ED Visit) (Addendum)
 09/24/2023  Name: Jonathan [name]. MRN: [id number] DOB: Apr 17, 1936  Today's TOC FU Call Status: Today's TOC FU Call Status:: Successful TOC FU Call Completed TOC FU Call Complete Date: 09/24/23 Patient's Name and Date of Birth confirmed.  Transition Care Management Follow-up Telephone Call Date of Discharge: 09/23/23 Discharge Facility: [facility]) Type of Discharge: Inpatient Admission Primary Inpatient Discharge Diagnosis:: hemoptysis; LUL mass How have you been since you were released from the hospital?: Better (I am doing better; had a great nights rest last night and am not having any blood when I cough; I am not coughing much.  I am out running errands now- please call me back this afternoon, I will be home around 1:00 pm) Any questions or concerns?: No  Items Reviewed: Did you receive and understand the discharge instructions provided?: Yes (thoroughly reviewed with patient who verbalizes good understanding of same) Medications obtained,verified, and reconciled?: Yes (Medications Reviewed) (Full medication reconciliation/ review completed; confirmed patient obtained/ is taking all newly Rx'd medications as instructed; self-manages medications- sent care coordination outreach to cardiology team regarding: clarification around KCl Rx);  5:21 pm:  Received follow up from Jonathan [name] re: supplemetal KCl- contacted patient to update-- patient verbalized understanding that Jonathan [name] said it was okay to discontinue potassium, given K+ value of 4.5 on 09/18/23; medication list updated accordingly   Any new allergies since your discharge?: No Dietary orders reviewed?: Yes Type of Diet Ordered:: Healthy as possible Do you have support at home?: Yes People in Home: spouse Name of Support/Comfort Primary Source: Reports independent in self-care activities; supportive spouse assists as/ if needed/ indicated  Medications Reviewed Today: Medications Reviewed Today     Reviewed by  Jonathan Mccarrell M, RN (Registered Nurse) on 09/24/23 at 1426  Med List Status: <None>   Medication Order Taking? Sig Documenting Provider Last Dose Status Informant  albuterol  (VENTOLIN  HFA) 108 (90 Base) MCG/ACT inhaler [id number] Yes USE 1 TO 2 INHALATIONS EVERY 6 HOURS AS NEEDED FOR WHEEZING OR SHORTNESS OF BREATH Jonathan [name] Taking Active Self, Pharmacy Records  Blood Glucose Monitoring Suppl (FREESTYLE FREEDOM LITE) w/Device KIT [id number] Yes Use Freestyle Freedom Lite meter to check blood sugar twice daily. DX:E11.65 Jonathan [name] Taking Active Self, Pharmacy Records  clopidogrel  (PLAVIX ) 75 MG tablet [id number] No Take 1 tablet (75 mg total) by mouth daily with breakfast.  Patient not taking: Reported on 09/24/2023   Jonathan [name] Not Taking Active Self, Pharmacy Records           Med Note (Midas Daughety [name]   Tue Sep 24, 2023  1:41 PM) 09/24/23: Reports during [facility] call, he has stopped taking as instructed by hospital discharging provider- verbalizes good understanding of post-hospital instructions  Dulaglutide  (TRULICITY ) 3 MG/0.5ML EMMANUEL [id number] Yes INJECT 3 MG UNDER THE SKIN ONCE A WEEK Jonathan [name] Taking Active Self, Pharmacy Records  empagliflozin  (JARDIANCE ) 25 MG TABS tablet [id number] Yes Take 1 tablet (25 mg total) by mouth daily before breakfast. For diabetes E11.65 and E 11.29 Jonathan [name] Taking Active Self, Pharmacy Records  escitalopram  (LEXAPRO ) 10 MG tablet [id number] No Take 1 tablet (10 mg total) by mouth daily.  Patient not taking: Reported on 09/24/2023   Jonathan [name] Not Taking Active Self, Pharmacy Records           Med Note (Jonathan [name]   Tue Sep 24, 2023  1:42 PM) 09/24/23: Reports during [facility] call-- he is no longer  taking  furosemide  (LASIX ) 40 MG tablet [id number] Yes Take 1 tablet (40 mg total) by mouth daily. Jonathan [name] Taking Active Self, Pharmacy Records  gabapentin  (NEURONTIN ) 100 MG capsule [id number] Yes Take 200 mg by mouth daily. Provider,  Historical, [name] Taking Active Self, Pharmacy Records  glucose blood (FREESTYLE LITE) test strip [id number] Yes USE AS INSTRUCTED Jonathan [name] Taking Active Self, Pharmacy Records  Insulin  Pen Needle (SURE COMFORT PEN NEEDLES) 31G X 6 MM MISC [id number] No Use as directed  Patient not taking: Reported on 09/24/2023   Jonathan [name] Not Taking Active Self, Pharmacy Records           Med Note (Jonathan [name]   Tue Sep 24, 2023  1:59 PM) 09/24/23: Reports during [facility] call he has never been on insulin   metFORMIN  (GLUCOPHAGE ) 500 MG tablet [id number] Yes Take 1 tablet (500 mg total) by mouth 2 (two) times daily with a meal. Jonathan [name] Taking Active Self, Pharmacy Records  metoprolol  (TOPROL -XL) 200 MG 24 hr tablet [id number] Yes Take 100 mg by mouth daily. Provider, Historical, [name] Taking Active Self, Pharmacy Records  Multiple Vitamin (MULTIVITAMIN WITH MINERALS) TABS tablet [id number] Yes Take 1 tablet by mouth daily. Provider, Historical, [name] Taking Active Self, Pharmacy Records  OXYGEN  [id number] Yes Inhale 2 L/min into the lungs at bedtime. PRN during day Provider, Historical, [name] Taking Active Self, Pharmacy Records  potassium chloride  SA (KLOR-CON  [name]) 20 MEQ tablet [id number] No Take 1 tablet (20 mEq total) by mouth daily.  Patient not taking: Reported on 09/24/2023   Jonathan [name] Not Taking Active Self, Pharmacy Records           Med Note (Jonathan [name]   Tue Sep 24, 2023  1:57 PM) 09/24/23:  Reports during [facility] call, he has not been taking this medication; states Never taken; with extensive review-- determined it was prescribed 02/22/23 post-hospital discharge: filled by [facility] pharmacy: apparently, has never been re-filled; message sent to cardiology provider to clarify whether he should or should not be taking  predniSONE  (DELTASONE ) 10 MG tablet [id number] Yes Take 3 tablets (30 mg total) by mouth daily with breakfast for 3 days, THEN 2 tablets (20 mg total) daily with breakfast for 3 days,  THEN 1 tablet (10 mg total) daily with breakfast for 3 days. Briana Elgin LABOR, [name] Taking Active   sacubitril -valsartan  (ENTRESTO ) 24-26 MG [id number] Yes Take 1 tablet by mouth 2 (two) times daily. Jonathan [name] Taking Active Self, Pharmacy Records  simvastatin  (ZOCOR ) 20 MG tablet [id number] Yes Take 20 mg by mouth daily. Provider, Historical, [name] Taking Active Self, Pharmacy Records  tamsulosin  (FLOMAX ) 0.4 MG CAPS capsule [id number] Yes Take 1 capsule (0.4 mg total) by mouth daily after supper. Paz, Jose E, [name] Taking Active Self, Pharmacy Records  Tiotropium Bromide -Olodaterol (STIOLTO RESPIMAT ) 2.5-2.5 MCG/ACT AERS [id number] Yes Inhale 2 puffs into the lungs daily. Neysa Reggy BIRCH, [name] Taking Active Self, Pharmacy Records           Med Note (Yareni Creps [name]   Tue Sep 24, 2023  1:58 PM) 09/24/23: Reports during [facility] call, he uses prn           Home Care and Equipment/Supplies: Were Home Health Services Ordered?: Yes Name of Home Health Agency:: [facility] Has Agency set up a time to come to your home?: No EMR reviewed for Home Health Orders: Orders present/patient has not received call (refer to CM for follow-up) Any  new equipment or medical supplies ordered?: No  Functional Questionnaire: Do you need assistance with bathing/showering or dressing?: No Do you need assistance with meal preparation?: No Do you need assistance with eating?: No Do you have difficulty maintaining continence: No Do you need assistance with getting out of bed/getting out of a chair/moving?: No Do you have difficulty managing or taking your medications?: No  Follow up appointments reviewed: PCP Follow-up appointment confirmed?: Yes (care coordination outreach in real-time with scheduling care guide to successfully schedule hospital follow up PCP appointment 09/27/23) Date of PCP follow-up appointment?: 09/27/23 Follow-up Provider: PCP- Dr. Amon [facility] Follow-up appointment confirmed?: Yes Date  of Specialist follow-up appointment?: 10/01/23 Follow-Up Specialty Provider:: Pulmonary provider: Dr. Brenna-- for bronchoscopy at [facility] per AVS instructions: no address provided: care coordination outreach to facilitate patient's understanding of what he should do on 10/01/23 Do you need transportation to your follow-up appointment?: No Do you understand care options if your condition(s) worsen?: Yes-patient verbalized understanding  SDOH Interventions Today    Flowsheet Row Most Recent Value  SDOH Interventions   Food Insecurity Interventions Intervention Not Indicated  Housing Interventions Intervention Not Indicated  Transportation Interventions Intervention Not Indicated  [Drives self]  Utilities Interventions Intervention Not Indicated       Goals Addressed             This Visit's Progress    TOC 30-day Program Care Plan   On track    Current Barriers:  Medication access Full medication review completed 09/24/23: patient reported he is not/ has not been taking potassium; verified through extensive efforts this was prescribed at time of 02/22/23 hospital discharge- provided to patient by [facility] pharmacy- with no refills; sent care coordination outreach to cardiology provider through review of EHR, requesting clarification; noted patient's most recent postassium level is 4.5 (09/18/23)  Provider appointments Patient has been instructed to attend 10/01/23 brochoscopy with Dr. Brenna at the [facility]; No address, time, or phone number was included on printed AVS: patient does not know what or where/ what time he should go:  MULTIPLE care coordination outreaches completed with [facility]/ [facility] / pulmonary provider (Dr. Icard)/ eventual endoscopy department at [facility]; eventually and with much effort obtained information that patient is to go to the main hospital facility ([facility]) at 9:00 am on 10/01/23: confirmed that he will be contacted by staff closer to  10/01/23-- if patient has questions in the mean time- he is to call and ask for Shannon/ respiratory coordinator at the endoscopy department [phone]) and/ or the short stay department at [phone]) Home Health services Confirmed PT/OT home health services referral sent to [facility]: patient has not heard from [facility], and there is no information included for patient on his discharge instructions; care coordination outreach completed with [facility]: [phone]: spoke with [facility]; prior to signing of TOC note: confirmed patient has scheduled PT initial home visit for tomorrow 09/25/23   Hospitalized 09/09/23- 09/22/22: hemoptysis with new diagnosis of LUL mass; multiple hospitalizations over last 6 months:  3 total with ED visits as well  Independent in self-care; resides with supportive spouse; no SDOH barriers identified  RNCM Clinical Goal(s):  Patient will work with the Care Management team over the next 30 days to address Transition of Care Barriers: Medication access Medication Management Provider appointments Home Health services through collaboration with RN Care manager, provider, and care team.   Interventions: Evaluation of current treatment plan related to  self management and patient's adherence  to plan as established by provider  Transitions of Care:  New goal. 09/24/23 Labs reviewed recent potassium values at time of most recent hospitalization, after discovering that patient is not taking potassium post- his prior admission in June: most recent value: 4.5 (09/18/23)  Durable Medical Equipment (DME) needs assessed with patient/caregiver Doctor Visits  - discussed the importance of doctor visits Communication with multiple providers  re: discrepancy of medication list- needs clarification around whether he should/ should not be taking potassium- currently on his med list but he is not taking; initiation of home health services; details of upcoming bronchoscopy procedure to be  completed on 10/01/23 Contacted provider for patient needs as above Arranged PCP follow-up within 7 days (Care Guide Scheduled) Contacted Health RN/OT/PT - [facility]: spoke with [facility]; she confirms patient will have initial PT home visit tomorrow 09/25/23 Contacted patient back to update him on all of the care coordination outreaches completed today/ provide updates as above-- still waiting for clarification around whether or not he should be taking potassium or not;  5:21 pm:  Received follow up from Jonathan [name] re: supplemetal KCl- contacted patient to update-- patient verbalized understanding that Jonathan [name] said it was okay to discontinue potassium, given K+ value of 4.5 on 09/18/23; medication list updated accordingly  Patient Goals/Self-Care Activities: Participate in Transition of Care Program/Attend TOC scheduled calls Take all medications as prescribed Attend all scheduled provider appointments Perform all self care activities independently  Call provider office for new concerns or questions  Continue pacing activity as your recuperation from recent surgery continues Continue using home oxygen  as prescribed Continue working with the home health team that is involved in your care If you believe your condition is getting worse- contact your care providers (doctors) promptly- reaching out to your doctor early when you have concerns can prevent you from having to go to the hospital  Follow Up Plan:  Telephone follow up appointment with care management team member scheduled for:  Wednesday 10/02/23 at 1:30 pm         Beatris Blinda Lawrence, RN, BSN, CCRN Alumnus RN Care Manager  Transitions of Care  [facility] [phone]: direct office

## 2023-09-24 NOTE — Telephone Encounter (Signed)
 Laine checking on scheduling Bronch procedure. Su Hilt phone number is [phone]. Also please call the patient.

## 2023-09-24 NOTE — Telephone Encounter (Signed)
 I can do a letter just need to know if there are any meds that need to be stopped so I can put it on the letter also will this patient need a follow up with Maralyn Sago to go over Bronch results.

## 2023-09-24 NOTE — Telephone Encounter (Signed)
 Patient is aware of scheduled bronch 10/01/23. Directions and instructions provided.  Pt voiced his understanding.   PCC's, will a letter be mailed to pt with instructions?

## 2023-09-25 ENCOUNTER — Telehealth: Payer: Self-pay

## 2023-09-25 DIAGNOSIS — E1122 Type 2 diabetes mellitus with diabetic chronic kidney disease: Secondary | ICD-10-CM | POA: Diagnosis not present

## 2023-09-25 DIAGNOSIS — Z95820 Peripheral vascular angioplasty status with implants and grafts: Secondary | ICD-10-CM | POA: Diagnosis not present

## 2023-09-25 DIAGNOSIS — J439 Emphysema, unspecified: Secondary | ICD-10-CM | POA: Diagnosis not present

## 2023-09-25 DIAGNOSIS — J440 Chronic obstructive pulmonary disease with acute lower respiratory infection: Secondary | ICD-10-CM | POA: Diagnosis not present

## 2023-09-25 DIAGNOSIS — J9621 Acute and chronic respiratory failure with hypoxia: Secondary | ICD-10-CM | POA: Diagnosis not present

## 2023-09-25 DIAGNOSIS — Z87891 Personal history of nicotine dependence: Secondary | ICD-10-CM | POA: Diagnosis not present

## 2023-09-25 DIAGNOSIS — E785 Hyperlipidemia, unspecified: Secondary | ICD-10-CM | POA: Diagnosis not present

## 2023-09-25 DIAGNOSIS — N1832 Chronic kidney disease, stage 3b: Secondary | ICD-10-CM | POA: Diagnosis not present

## 2023-09-25 DIAGNOSIS — Z7984 Long term (current) use of oral hypoglycemic drugs: Secondary | ICD-10-CM | POA: Diagnosis not present

## 2023-09-25 DIAGNOSIS — Z7902 Long term (current) use of antithrombotics/antiplatelets: Secondary | ICD-10-CM | POA: Diagnosis not present

## 2023-09-25 DIAGNOSIS — Z7982 Long term (current) use of aspirin: Secondary | ICD-10-CM | POA: Diagnosis not present

## 2023-09-25 DIAGNOSIS — J181 Lobar pneumonia, unspecified organism: Secondary | ICD-10-CM | POA: Diagnosis not present

## 2023-09-25 DIAGNOSIS — E1151 Type 2 diabetes mellitus with diabetic peripheral angiopathy without gangrene: Secondary | ICD-10-CM | POA: Diagnosis not present

## 2023-09-25 DIAGNOSIS — Z604 Social exclusion and rejection: Secondary | ICD-10-CM | POA: Diagnosis not present

## 2023-09-25 DIAGNOSIS — I083 Combined rheumatic disorders of mitral, aortic and tricuspid valves: Secondary | ICD-10-CM | POA: Diagnosis not present

## 2023-09-25 DIAGNOSIS — I130 Hypertensive heart and chronic kidney disease with heart failure and stage 1 through stage 4 chronic kidney disease, or unspecified chronic kidney disease: Secondary | ICD-10-CM | POA: Diagnosis not present

## 2023-09-25 DIAGNOSIS — I5042 Chronic combined systolic (congestive) and diastolic (congestive) heart failure: Secondary | ICD-10-CM | POA: Diagnosis not present

## 2023-09-25 DIAGNOSIS — Z7985 Long-term (current) use of injectable non-insulin antidiabetic drugs: Secondary | ICD-10-CM | POA: Diagnosis not present

## 2023-09-25 DIAGNOSIS — I2510 Atherosclerotic heart disease of native coronary artery without angina pectoris: Secondary | ICD-10-CM | POA: Diagnosis not present

## 2023-09-25 NOTE — Telephone Encounter (Signed)
 Spoke w/ Arlys John- verbal orders given.

## 2023-09-25 NOTE — Telephone Encounter (Signed)
 Copied from CRM [id number]. Topic: Clinical - Home Health Verbal Orders >> Sep 25, 2023 11:19 AM Richerd A wrote: Caller/Agency: Redell Cal Corean Salome Callback Number: [phone] Redell said that a Voicemail may be left on his phone with verbal  Service Requested: Physical Therapy Frequency: Physical Theraphy 1x wek for 4 weeks for Stamina and Balance Any new concerns about the patient? No

## 2023-09-26 ENCOUNTER — Telehealth: Payer: Self-pay

## 2023-09-26 NOTE — Telephone Encounter (Signed)
 Oxygen re-cert signed and faxed back to [employer] at [phone]. Form sent for scanning.

## 2023-09-27 ENCOUNTER — Ambulatory Visit: Payer: Medicare Other | Admitting: Internal Medicine

## 2023-09-27 ENCOUNTER — Encounter: Payer: Self-pay | Admitting: Internal Medicine

## 2023-09-27 VITALS — BP 134/80 | HR 69 | Temp 98.0°F | Resp 18 | Ht 74.0 in | Wt 221.4 lb

## 2023-09-27 DIAGNOSIS — I5042 Chronic combined systolic (congestive) and diastolic (congestive) heart failure: Secondary | ICD-10-CM

## 2023-09-27 DIAGNOSIS — E1159 Type 2 diabetes mellitus with other circulatory complications: Secondary | ICD-10-CM | POA: Diagnosis not present

## 2023-09-27 DIAGNOSIS — J9611 Chronic respiratory failure with hypoxia: Secondary | ICD-10-CM | POA: Diagnosis not present

## 2023-09-27 DIAGNOSIS — J189 Pneumonia, unspecified organism: Secondary | ICD-10-CM

## 2023-09-27 DIAGNOSIS — R042 Hemoptysis: Secondary | ICD-10-CM | POA: Diagnosis not present

## 2023-09-27 NOTE — Progress Notes (Signed)
 Subjective:    Patient ID: Jonathan [name] [name]., male    DOB: Jan 08, 1936, 88 y.o.   MRN: [id number]  DOS:  09/27/2023 Type of visit - description: Hospital follow-up  Presented to the hospital and discharge 09/23/2023. Upon admission c/o  chest pain, hemoptysis, Dx with respiratory failure. Initially in the ICU. In the process of taking care of the patient they found pneumonia but also the CT also show a masslike consolidation. Hemoptysis resolved.  Latest labs: Creatinine 1.3, not far from baseline.  Calcium  8.4 w/ normal ionized calcium .  Hemoglobin 12.6 slightly low.  WBCs 17.5.  Review of Systems Feeling very well. Since he left the hospital continuing cough but much less, only saw one drop of blood in the sputum once since d/c home. Sputum overall much less  O2 sat at rest with oxygen : 92%, 93%.  It does drop to 88% with exertion. Denies edema or chest pain.  Past Medical History:  Diagnosis Date   CAD (coronary artery disease)    Two RCA stents remotely / 3rd RCA stent 2006   Colon polyps    s/p several Cscopes.   COPD (chronic obstructive pulmonary disease) (HCC)    on O2, nocturnal   Diabetes mellitus    dx aprox 2009   ED (erectile dysfunction)    has a vacumm device   Ejection fraction    Hyperlipidemia    dx in 90s   Hypertension    dx in the 20   Hypogonadism male    PVD (peripheral vascular disease) (HCC)    s/p stents at [facility] 2009, Dr Ladona   Shortness of breath    O2 Sat dropped to 82% walking on the treadmill, September, 2012    Past Surgical History:  Procedure Laterality Date   APPENDECTOMY     CORONARY STENT INTERVENTION N/A 01/12/2022   Procedure: CORONARY STENT INTERVENTION;  Surgeon: Jordan, Peter M, MD;  Location: [facility] INVASIVE CV LAB;  Service: Cardiovascular;  Laterality: N/A;   LEFT HEART CATH AND CORONARY ANGIOGRAPHY N/A 03/13/2023   Procedure: LEFT HEART CATH AND CORONARY ANGIOGRAPHY;  Surgeon: Jordan, Peter M, MD;  Location: [facility] INVASIVE CV  LAB;  Service: Cardiovascular;  Laterality: N/A;   RIGHT/LEFT HEART CATH AND CORONARY ANGIOGRAPHY N/A 01/11/2022   Procedure: RIGHT/LEFT HEART CATH AND CORONARY ANGIOGRAPHY;  Surgeon: Claudene Victory ORN, MD;  Location: [facility] INVASIVE CV LAB;  Service: Cardiovascular;  Laterality: N/A;   TONSILLECTOMY      Current Outpatient Medications  Medication Instructions   albuterol  (VENTOLIN  HFA) 108 (90 Base) MCG/ACT inhaler USE 1 TO 2 INHALATIONS EVERY 6 HOURS AS NEEDED FOR WHEEZING OR SHORTNESS OF BREATH   Blood Glucose Monitoring Suppl (FREESTYLE FREEDOM LITE) w/Device KIT Use Freestyle Freedom Lite meter to check blood sugar twice daily. DX:E11.65   [Paused] clopidogrel  (PLAVIX ) 75 mg, Oral, Daily with breakfast   Dulaglutide  (TRULICITY ) 3 MG/0.5ML SOAJ INJECT 3 MG UNDER THE SKIN ONCE A WEEK   empagliflozin  (JARDIANCE ) 25 mg, Oral, Daily before breakfast, For diabetes E11.65 and E 11.29   escitalopram  (LEXAPRO ) 10 mg, Oral, Daily   furosemide  (LASIX ) 40 mg, Oral, Daily   gabapentin  (NEURONTIN ) 200 mg, Daily   glucose blood (FREESTYLE LITE) test strip USE AS INSTRUCTED   metFORMIN  (GLUCOPHAGE ) 500 mg, Oral, 2 times daily with meals   metoprolol  (TOPROL -XL) 100 mg, Daily   Multiple Vitamin (MULTIVITAMIN WITH MINERALS) TABS tablet 1 tablet, Daily   OXYGEN  2 L/min, Daily at bedtime   potassium chloride   SA (KLOR-CON  M) 20 MEQ tablet 20 mEq, Oral, Daily   predniSONE  (DELTASONE ) 10 MG tablet Take 3 tablets (30 mg total) by mouth daily with breakfast for 3 days, THEN 2 tablets (20 mg total) daily with breakfast for 3 days, THEN 1 tablet (10 mg total) daily with breakfast for 3 days.   sacubitril -valsartan  (ENTRESTO ) 24-26 MG 1 tablet, Oral, 2 times daily   simvastatin  (ZOCOR ) 20 mg, Daily   tamsulosin  (FLOMAX ) 0.4 mg, Oral, Daily after supper   Tiotropium Bromide -Olodaterol (STIOLTO RESPIMAT ) 2.5-2.5 MCG/ACT AERS 2 puffs, Inhalation, Daily       Objective:   Physical Exam BP 134/80   Pulse 69   Temp 98  F (36.7 C) (Oral)   Resp 18   Ht 6' 2 (1.88 m)   Wt 221 lb 6 oz (100.4 kg)   SpO2 91% Comment: 3L/min  BMI 28.42 kg/m  General:   Well developed, NAD, BMI noted.  Using a portable oxygen  HEENT:  Normocephalic . Face symmetric, atraumatic Neck: No lymphadenopathies Lungs:  Decreased breath sounds but clear. Normal respiratory effort, no intercostal retractions, no accessory muscle use. Heart: RRR,  no murmur.  Lower extremities: no pretibial edema bilaterally.  Calf soft and nontender Skin: Not pale. Not jaundice Neurologic:  alert & oriented X3.  Speech normal, gait appropriate for age and unassisted Psych--  Cognition and judgment appear intact.  Cooperative with normal attention span and concentration.  Behavior appropriate. No anxious or depressed appearing.      Assessment     Assessment DM dx ~2009, complicated by CAD, PVD, mild DM neuropathy. Sees Dr. Von Hypogonadism. Sees Dr. 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 Cath: Nonobstructive CAD ED Polycythemia: Saw hematology 11-2018, felt to be due to chronic respiratory failure.  Not likely polycythemia vera  PLAN    Hospital follow-up. Respiratory failure: Improved, has chronic hypoxia and oxygen  supplements seems to be back to baseline. Community-acquired pneumonia: Completed antibiotics in the hospital.  Doing very well, no fever, cough is minimal.  Check a CMP and CBC Hemoptysis, CT showing masslike consolidation, plan is for outpatient bronchoscopy.  ER if  starts coughing blood again. Emphysema: In the hospital was managed with multiple meds, currently back on his Stiolto, albuterol .  Seems well-controlled CAD: Plavix  was  held during the hospitalist the and will continue holding due to upcoming bronchoscopy. CHF: There was no evidence of exacerbation. DM: Per Endo, finishing a round of steroids, CBGs never more than 180 per patient. RTC 3 months

## 2023-09-27 NOTE — Patient Instructions (Addendum)
  Check the  blood pressure regularly Blood pressure goal:  between 110/65 and  135/85. If it is consistently higher or lower, let me know     GO TO THE LAB : Get the blood work     Next visit with me in 3 months, sooner if needed. Please schedule it at the front desk       Per our records you are due for your diabetic eye exam. Please contact your eye doctor to schedule an appointment. Please have them send copies of your office visit notes to us . Our fax number is [phone]. If you need a referral to an eye doctor please let us  know.

## 2023-09-28 LAB — CBC WITH DIFFERENTIAL/PLATELET
Absolute Lymphocytes: 2531 {cells}/uL (ref 850–3900)
Absolute Monocytes: 1362 {cells}/uL — ABNORMAL HIGH (ref 200–950)
Basophils Absolute: 30 {cells}/uL (ref 0–200)
Basophils Relative: 0.2 %
Eosinophils Absolute: 118 {cells}/uL (ref 15–500)
Eosinophils Relative: 0.8 %
HCT: 40.6 % (ref 38.5–50.0)
Hemoglobin: 13.6 g/dL (ref 13.2–17.1)
MCH: 32.7 pg (ref 27.0–33.0)
MCHC: 33.5 g/dL (ref 32.0–36.0)
MCV: 97.6 fL (ref 80.0–100.0)
MPV: 10.2 fL (ref 7.5–12.5)
Monocytes Relative: 9.2 %
Neutro Abs: 10760 {cells}/uL — ABNORMAL HIGH (ref 1500–7800)
Neutrophils Relative %: 72.7 %
Platelets: 297 10*3/uL (ref 140–400)
RBC: 4.16 10*6/uL — ABNORMAL LOW (ref 4.20–5.80)
RDW: 14.8 % (ref 11.0–15.0)
Total Lymphocyte: 17.1 %
WBC: 14.8 10*3/uL — ABNORMAL HIGH (ref 3.8–10.8)

## 2023-09-28 LAB — COMPREHENSIVE METABOLIC PANEL
AG Ratio: 1.3 (calc) (ref 1.0–2.5)
ALT: 18 U/L (ref 9–46)
AST: 13 U/L (ref 10–35)
Albumin: 3.7 g/dL (ref 3.6–5.1)
Alkaline phosphatase (APISO): 66 U/L (ref 35–144)
BUN/Creatinine Ratio: 20 (calc) (ref 6–22)
BUN: 26 mg/dL — ABNORMAL HIGH (ref 7–25)
CO2: 26 mmol/L (ref 20–32)
Calcium: 9.5 mg/dL (ref 8.6–10.3)
Chloride: 104 mmol/L (ref 98–110)
Creat: 1.29 mg/dL — ABNORMAL HIGH (ref 0.70–1.22)
Globulin: 2.8 g/dL (ref 1.9–3.7)
Glucose, Bld: 99 mg/dL (ref 65–99)
Potassium: 4 mmol/L (ref 3.5–5.3)
Sodium: 141 mmol/L (ref 135–146)
Total Bilirubin: 0.5 mg/dL (ref 0.2–1.2)
Total Protein: 6.5 g/dL (ref 6.1–8.1)

## 2023-09-28 NOTE — Progress Notes (Signed)
 HPI male former smoker followed for COPD, chronic respiratory failure with hypoxia, complicated by CAD/MI/ Stents/ PAD, DM2, polycythemia PFT- PFT 08/13/18- Diffusion moderately reduced. Normal spirometry flows and lung volumes10/23/12-  Normal spirometry flows with small- airway response to bronchodilator, normal lung volumes and moderately reduced diffusion. FEV1/FVC 0.79, DLCO 58% -06/18/12- 96%, 88%, 94% 449 M. Significant desaturation with exercise. Overnight Oximetry 02/07/17-room air-sustained desaturation less than or equal to 88% for over 7 hours Walk Test 2019-  Walk Test POC qualifying 05/06/23- Did not qualify for portable O2. Lowest saturation was 90% on room air. ------------------------------------------------------------------------------------------------------------   05/06/23- 88 year old male former smoker (60 pk yr) followed for COPD, chronic respiratory failure with Hypoxia,, complicated by CAD/MI/ stents/ PAD,CHF, H DM2, PVD,  O2 2 L/ APS-sleep  -Albuterol  hfa, Stiolto 2.5,  Body weight today  Arrival O2 sat 97% on room air. Oxygen  used mainly at night. He asks POC- lighter to carry. Walk Test POC qualifying 05/06/23- Did not qualify for portable O2. Lowest saturation was 90% on room air. Asks refill Stiolto CXR 02/21/23-  IMPRESSION: Changes most consistent with interstitial edema.  09/30/23-88 year old male former smoker (60 pk yr) followed for COPD, chronic respiratory failure with Hypoxia,, complicated by CAD/MI/ stents/ PAD,CHF, H DM2, PVD,  O2 2 L/ APS-sleep  -Albuterol  hfa, Stiolto 2.5,  Body weight today  Arrival O2 sat 217 =lbs  Hosp f/u- hemoptysis, Plavix  held. LUL mass/ consolidation. Discharged on O2 3L, prednisone  taper ends today> needs  bronchoscopy for LUL mass. Bronchoscopy is scheduled for tomorrow morning with Dr Gretta. I reviewed the CT images with him and answered questions. Plavix  is on hold for bronch and he has not noted more bleeding. He  rarely feels need for his inhalers- we discussed indications. He has a home concentrator and POC (arrival O2 sat today on 3L pulse was 96%). He asks about an even smaller portable concentrator. I suggested he call [facility] to discuss options, but explained the management of his lung mass would take priority. I anticipate Dr Gretta will likely follow-up the bronchoscopy results and make referral to Oncology, but she can communicate any other thoughts.  CT chest 09/18/22 IMPRESSION: 1. Persistent aggressive appearing mass in the central aspect of the left upper lobe highly concerning for primary bronchogenic neoplasm. Surrounding postobstructive pneumonia has improved, with residual areas of postobstructive pneumonitis in the left upper lobe noted on today's examination. 2. Trace right pleural effusion. 3. Aortic atherosclerosis, in addition to left main and three-vessel coronary artery disease. 4. Trace amount of pericardial fluid and/or thickening, unlikely to be of any hemodynamic significance at this time. 5. Diffuse bronchial wall thickening with moderate centrilobular and paraseptal emphysema; imaging findings suggestive of underlying COPD. Aortic Atherosclerosis (ICD10-I70.0) and Emphysema (ICD10-J43.9).     ROS-see HPI  + = positive Constitutional:   No-   weight loss, night sweats, fevers, chills, fatigue, lassitude. HEENT:   No-  headaches, difficulty swallowing, tooth/dental problems, sore throat,       No-  sneezing, itching, ear ache, nasal congestion, post nasal drip,  CV:  No-   chest pain, orthopnea, PND, swelling in lower extremities, anasarca, dizziness, palpitations,                 Claudication R thigh Resp:   + shortness of breath with exertion or at rest.              No-   productive cough,  No non-productive cough,  No- coughing up of blood.  No-   change in color of mucus.   wheezing.   Skin: No-   rash or lesions. GI:  No-   heartburn, indigestion,  abdominal pain, nausea, vomiting, GU:  MS:  No-   joint pain or swelling.   Neuro-     nothing unusual Psych:  No- change in mood or affect. No depression or anxiety.  No memory loss.  OBJ- Physical Exam    O2 3L pulse POC General- Alert, Oriented, Affect-appropriate, Distress- none acute., looks fit Skin- rash-none, lesions- none, excoriation- none Lymphadenopathy-  Head- atraumatic            Eyes- Gross vision intact, PERRLA, conjunctivae and secretions clear            Ears- Hearing, canals-normal            Nose-  no-Septal dev, mucus, polyps, erosion, perforation             Throat- Mallampati II , mucosa clear , drainage- none, tonsils- atrophic Neck- flexible , trachea midline, no stridor , thyroid  nl, carotid no bruit Chest - symmetrical excursion , unlabored           Heart/CV- RRR/rapid , no murmur , no gallop  , no rub, nl s1 s2                           - JVD none , edema- none, stasis changes- none, varices- none           Lung- +diminished, cough-none , dullness-none, rub- none,            Chest wall-  Abd-  Br/ Gen/ Rectal- Not done, not indicated Extrem- cyanosis- none,  atrophy- none, strength- nl  Neuro- grossly intact to observation

## 2023-09-29 NOTE — Assessment & Plan Note (Signed)
 Hospital follow-up. Respiratory failure: Improved, has chronic hypoxia and oxygen  supplements seems to be back to baseline. Community-acquired pneumonia: Completed antibiotics in the hospital.  Doing very well, no fever, cough is minimal.  Check a CMP and CBC Hemoptysis, CT showing masslike consolidation, plan is for outpatient bronchoscopy.  ER if  starts coughing blood again. Emphysema: In the hospital was managed with multiple meds, currently back on his Stiolto, albuterol .  Seems well-controlled CAD: Plavix  was held during the hospitalist the and will continue holding due to upcoming bronchoscopy. CHF: There was no evidence of exacerbation. DM: Per Endo, finishing a round of steroids, CBGs never more than 180 per patient. RTC 3 months

## 2023-09-30 ENCOUNTER — Ambulatory Visit: Payer: Medicare Other | Admitting: Internal Medicine

## 2023-09-30 ENCOUNTER — Encounter: Payer: Self-pay | Admitting: Internal Medicine

## 2023-09-30 ENCOUNTER — Encounter (HOSPITAL_COMMUNITY): Payer: Self-pay | Admitting: Pulmonary Disease

## 2023-09-30 ENCOUNTER — Other Ambulatory Visit: Payer: Self-pay

## 2023-09-30 ENCOUNTER — Telehealth: Payer: Self-pay

## 2023-09-30 VITALS — BP 140/76 | HR 77 | Temp 97.8°F | Ht 74.0 in | Wt 217.6 lb

## 2023-09-30 DIAGNOSIS — R918 Other nonspecific abnormal finding of lung field: Secondary | ICD-10-CM

## 2023-09-30 DIAGNOSIS — J9611 Chronic respiratory failure with hypoxia: Secondary | ICD-10-CM

## 2023-09-30 DIAGNOSIS — J449 Chronic obstructive pulmonary disease, unspecified: Secondary | ICD-10-CM | POA: Diagnosis not present

## 2023-09-30 DIAGNOSIS — E118 Type 2 diabetes mellitus with unspecified complications: Secondary | ICD-10-CM

## 2023-09-30 MED ORDER — FUROSEMIDE 40 MG PO TABS: 40.0000 mg | ORAL_TABLET | Freq: Every day | ORAL | 1 refills | Status: DC

## 2023-09-30 MED ORDER — TRULICITY 3 MG/0.5ML ~~LOC~~ SOAJ: SUBCUTANEOUS | 3 refills | Status: DC

## 2023-09-30 NOTE — Anesthesia Preprocedure Evaluation (Addendum)
 Anesthesia Evaluation  Patient identified by MRN, date of birth, ID band Patient awake    Reviewed: Allergy & Precautions, NPO status , Patient's Chart, lab work & pertinent test results  Airway Mallampati: II  TM Distance: >3 FB Neck ROM: Full    Dental  (+) Implants, Teeth Intact, Dental Advisory Given   Pulmonary shortness of breath (2L at night and PRN) and Long-Term Oxygen  Therapy, COPD,  COPD inhaler and oxygen  dependent, former smoker   Pulmonary exam normal breath sounds clear to auscultation       Cardiovascular hypertension, Pt. on medications and Pt. on home beta blockers + CAD, + Past MI, + Cardiac Stents, + Peripheral Vascular Disease and +CHF  Normal cardiovascular exam  09/20/2023 TTE  1. Left ventricular ejection fraction, by estimation, is 55 to 60%. The  left ventricle has normal function. The left ventricle has no regional  wall motion abnormalities. There is mild concentric left ventricular  hypertrophy. Left ventricular diastolic  parameters are consistent with Grade I diastolic dysfunction (impaired  relaxation).   2. Right ventricular systolic function is normal. The right ventricular  size is normal.   3. Left atrial size was moderately dilated.   4. Mild flail of anterior leaflet.   5. The mitral valve is degenerative. Mild mitral valve regurgitation.   6. The aortic valve is tricuspid. There is mild calcification of the  aortic valve. There is mild thickening of the aortic valve. Aortic valve  regurgitation is mild. Aortic valve sclerosis/calcification is present,  without any evidence of aortic  stenosis.   7. There is mild (Grade II) atheroma plaque involving the aortic root and  ascending aorta.   8. The inferior vena cava is dilated in size with >50% respiratory  variability, suggesting right atrial pressure of 8 mmHg.      Neuro/Psych    GI/Hepatic   Endo/Other  diabetes, Type 2     Renal/GU Lab Results      Component                Value               Date                         K                        4.0                 09/27/2023                CO2                      26                  09/27/2023                BUN                      26 (H)              09/27/2023                CREATININE               1.29 (H)            09/27/2023  GFRNONAA                 52 (L)              09/18/2023                         Musculoskeletal   Abdominal Normal abdominal exam  (+)   Peds  Hematology Lab Results      Component                Value               Date                             HGB                      13.6                09/27/2023                HCT                      40.6                09/27/2023                   PLT                      297                 09/27/2023              Anesthesia Other Findings   Reproductive/Obstetrics                             Anesthesia Physical Anesthesia Plan  ASA: 3  Anesthesia Plan: General   Post-op Pain Management: Minimal or no pain anticipated   Induction: Intravenous  PONV Risk Score and Plan: Treatment may vary due to age or medical condition  Airway Management Planned: Oral ETT  Additional Equipment: None  Intra-op Plan:   Post-operative Plan: Extubation in OR  Informed Consent: I have reviewed the patients History and Physical, chart, labs and discussed the procedure including the risks, benefits and alternatives for the proposed anesthesia with the patient or authorized representative who has indicated his/her understanding and acceptance.     Dental advisory given  Plan Discussed with: CRNA and Anesthesiologist  Anesthesia Plan Comments: (PAT note written 09/30/2023 by Isaiah Ruder, PA-C.  LUL mass  )       Anesthesia Quick Evaluation

## 2023-09-30 NOTE — Telephone Encounter (Signed)
 Patient called needs Trulicity to be sent to [facility] DELIVERY best contact number is [phone].

## 2023-09-30 NOTE — Assessment & Plan Note (Addendum)
 O2 2-3L/ [facility] since hosp 09/2023. Plan- he can ask [employer] about smaller POC, but this will have even less battery life.

## 2023-09-30 NOTE — Assessment & Plan Note (Signed)
 Recent hospital, but feeling better Not much recognized need for his inhalers, but use reviewed.

## 2023-09-30 NOTE — Progress Notes (Signed)
 Anesthesia Chart Review: CANDELARIA MICHAE POLITE  Case: [id number] Date/Time: 10/01/23 0900   Procedure: ROBOTIC ASSISTED NAVIGATIONAL BRONCHOSCOPY   Anesthesia type: General   Pre-op diagnosis: lung nodule LUL   [facility]: [facility] / [facility]   Surgeons: Brenna Adine CROME, DO       DISCUSSION: Patient is an 88 year old male scheduled for the above procedure.  History includes former smoker (quit 06/17/77), COPD, dyspnea, chronic respiratory failure with hypoxia (home O2 2L at night and PRN), HTN, HLD, DM2, CAD (MI s/p PTCA/stent mRCA and oPL branch 11/05/01; stenting pRCA 06/04/05; DES LAD & DES pRCA 01/12/22), PAD (left SFA stent 07/05/08).   Hospitalized 09/08/23 - 09/23/23 with hemoptysis.CTA showed masslike consolidation in the LUL.  Plavix  held. He was observed and managed in ICU by PCCM. He was also started empirically on antibiotics for presumed CAP. He required heat  high flow [place] with rates up to 60 L/min. He continued to require high does of O2 even after antibiotics, so steroids started,. He was weaned to 3L/min per [place] be discharge. Repeat CT chest on 09/19/23 showed persistent aggressive appearing mass in the LUL concerning for bronchogenic neoplasm. Echo on 09/20/23 showed EF 55-60%, no RWMA, mild concentric LVH, grade 1 DD, normal RVSF, moderately dilated LA, mild fail of anterior MV with mild MR, mild AI, grade II atheroma involving the aortic root and ascending aorta, atrial septum grossly normal. Outpatient bronchoscopy planned.   Cardiologist is Dr. Court. S/p LHC 03/13/23 showing nonobstructive CAD with continued patency of LAD and RCA stents.  Continue medical management recommended. One year follow-up recommended at 03/21/23 APP visit.   Evaluated by Dr. Reggy Salt on 09/30/23. Feeling much better since hospital discharge. Continuing 2-3L O2 (has home concentrator and POC). Plavix  on hold for bronchoscopy. No more bleeding. May need referral to oncology depending on pathology  results.   A1c 6.9% on 09/09/23. Last Trulicitly 09/14/23. Last Jardiance  09/30/23. Last Plavix  09/23/23.   Anesthesia team to evaluate on the day of surgery.   VS: Ht 6' 2 (1.88 m)   Wt 98.7 kg   BMI 27.94 kg/m  BP Readings from Last 3 Encounters:  09/30/23 (!) 140/76  09/27/23 134/80  09/23/23 (!) 153/73   Pulse Readings from Last 3 Encounters:  09/30/23 77  09/27/23 69  09/23/23 74    PROVIDERS: [name], MD is PCP  Court Carrier, MD is cardiologist Salt Reggy, MD is pulmonologist Thapa, [place], MD is endocrinologist   LABS: Most recent labs results in [facility] include: Lab Results  Component Value Date   WBC 14.8 (H) 09/27/2023   HGB 13.6 09/27/2023   HCT 40.6 09/27/2023   PLT 297 09/27/2023   GLUCOSE 99 09/27/2023   CHOL 108 04/01/2023   TRIG 83.0 04/01/2023   HDL 43.40 04/01/2023   LDLDIRECT 55.0 01/01/2018   LDLCALC 48 04/01/2023   ALT 18 09/27/2023   AST 13 09/27/2023   NA 141 09/27/2023   K 4.0 09/27/2023   CL 104 09/27/2023   CREATININE 1.29 (H) 09/27/2023   BUN 26 (H) 09/27/2023   CO2 26 09/27/2023   TSH 1.88 04/26/2023   PSA 0.93 11/29/2022   INR 1.1 09/10/2023   HGBA1C 6.9 (H) 09/09/2023   MICROALBUR 31.5 (H) 04/01/2023     OTHER:  Pulmonary Testing as outlined by Dr. Reggy Salt: PFT- PFT 08/13/18- Diffusion moderately reduced. Normal spirometry flows and lung volumes10/23/12-  Normal spirometry flows with small- airway response to bronchodilator, normal  lung volumes and moderately reduced diffusion. FEV1/FVC 0.79, DLCO 58% -06/18/12- 96%, 88%, 94% 449 M. Significant desaturation with exercise. Overnight Oximetry 02/07/17-room air-sustained desaturation less than or equal to 88% for over 7 hours Walk Test 2019-  Walk Test POC qualifying 05/06/23- Did not qualify for portable O2. Lowest saturation was 90% on room air.   IMAGES: CT Chest 09/19/23: IMPRESSION: 1. Persistent aggressive appearing mass in the central aspect of the left  upper lobe highly concerning for primary bronchogenic neoplasm. Surrounding postobstructive pneumonia has improved, with residual areas of postobstructive pneumonitis in the left upper lobe noted on today's examination. 2. Trace right pleural effusion. 3. Aortic atherosclerosis, in addition to left main and three-vessel coronary artery disease. 4. Trace amount of pericardial fluid and/or thickening, unlikely to be of any hemodynamic significance at this time. 5. Diffuse bronchial wall thickening with moderate centrilobular and paraseptal emphysema; imaging findings suggestive of underlying COPD. - Aortic Atherosclerosis (ICD10-I70.0) and Emphysema (ICD10-J43.9).   EKG: 09/14/23: Sinus rhythm with 1st degree A-V block with occasional Premature ventricular complexes Rightward axis Anterior infarct , age undetermined Prolonged QT (QT 508 ms, QTcB 524 ms) When compared with ECG of 08-Sep-2023 21:44, QT has lengthened ectopy is new rate is slower Confirmed by Lavona Agent ([id number]) on 09/14/2023 11:12:43 AM   CV: Echo 09/20/23: IMPRESSIONS   1. Left ventricular ejection fraction, by estimation, is 55 to 60%. The  left ventricle has normal function. The left ventricle has no regional  wall motion abnormalities. There is mild concentric left ventricular  hypertrophy. Left ventricular diastolic  parameters are consistent with Grade I diastolic dysfunction (impaired  relaxation).   2. Right ventricular systolic function is normal. The right ventricular  size is normal.   3. Left atrial size was moderately dilated.   4. Mild flail of anterior leaflet.   5. The mitral valve is degenerative. Mild mitral valve regurgitation.   6. The aortic valve is tricuspid. There is mild calcification of the  aortic valve. There is mild thickening of the aortic valve. Aortic valve  regurgitation is mild. Aortic valve sclerosis/calcification is present,  without any evidence of aortic  stenosis.    7. There is mild (Grade II) atheroma plaque involving the aortic root and  ascending aorta.   8. The inferior vena cava is dilated in size with >50% respiratory  variability, suggesting right atrial pressure of 8 mmHg.  - IAS/Shunts: The atrial septum is grossly normal.  - Comparison echo 02/21/23: LVEF 40-45%, LV global hypokinesis, moderately dilated internal LV, grade 1 DD, normal RVSF, mild AR   Cardiac cath 6/276/24:   Lida Cx lesion is 60% stenosed.   Mid Cx to Dist Cx lesion is 40% stenosed.   Dist LAD lesion is 40% stenosed.   Non-stenotic Prox LAD lesion was previously treated.   Non-stenotic Prox RCA lesion was previously treated.   LV end diastolic pressure is mildly elevated.   Nonobstructive CAD. Continued patency of stents in the LAD and RCA Mildly elevated LVEDP 22 mm Hg    Plan: medical management.    US  Carotid 05/30/21: Summary:  - Right Carotid: Velocities in the right ICA are consistent with a 1-39% stenosis. Non-hemodynamically significant plaque <50% noted in the CCA.  - Left Carotid: Velocities in the left ICA are consistent with a 1-39% stenosis. Non-hemodynamically significant plaque <50% noted in the CCA.  - Vertebrals: Bilateral vertebral arteries demonstrate antegrade flow.  - Subclavians: Normal flow hemodynamics were seen in bilateral subclavian arteries.  Past Medical History:  Diagnosis Date   CAD (coronary artery disease)    Two RCA stents remotely / 3rd RCA stent 2006   Colon polyps    s/p several Cscopes.   COPD (chronic obstructive pulmonary disease) (HCC)    on O2, nocturnal   Diabetes mellitus    dx aprox 2009   ED (erectile dysfunction)    has a vacumm device   Ejection fraction    Hyperlipidemia    dx in 90s   Hypertension    dx in the 17   Hypogonadism male    PVD (peripheral vascular disease) (HCC)    s/p stents at LE 2009, Dr Ladona   Shortness of breath    O2 Sat dropped to 82% walking on the treadmill, September, 2012     Past Surgical History:  Procedure Laterality Date   APPENDECTOMY     CORONARY STENT INTERVENTION N/A 01/12/2022   Procedure: CORONARY STENT INTERVENTION;  Surgeon: Jordan, Peter M, MD;  Location: [facility] INVASIVE CV LAB;  Service: Cardiovascular;  Laterality: N/A;   LEFT HEART CATH AND CORONARY ANGIOGRAPHY N/A 03/13/2023   Procedure: LEFT HEART CATH AND CORONARY ANGIOGRAPHY;  Surgeon: Jordan, Peter M, MD;  Location: [facility] INVASIVE CV LAB;  Service: Cardiovascular;  Laterality: N/A;   RIGHT/LEFT HEART CATH AND CORONARY ANGIOGRAPHY N/A 01/11/2022   Procedure: RIGHT/LEFT HEART CATH AND CORONARY ANGIOGRAPHY;  Surgeon: Claudene Victory ORN, MD;  Location: [facility] INVASIVE CV LAB;  Service: Cardiovascular;  Laterality: N/A;   TONSILLECTOMY      MEDICATIONS: No current facility-administered medications for this encounter.    albuterol  (VENTOLIN  HFA) 108 (90 Base) MCG/ACT inhaler   Blood Glucose Monitoring Suppl (FREESTYLE FREEDOM LITE) w/Device KIT   [Paused] clopidogrel  (PLAVIX ) 75 MG tablet   Dulaglutide  (TRULICITY ) 3 MG/0.5ML SOAJ   empagliflozin  (JARDIANCE ) 25 MG TABS tablet   furosemide  (LASIX ) 40 MG tablet   gabapentin  (NEURONTIN ) 100 MG capsule   glucose blood (FREESTYLE LITE) test strip   metFORMIN  (GLUCOPHAGE ) 500 MG tablet   metoprolol  (TOPROL -XL) 200 MG 24 hr tablet   Multiple Vitamin (MULTIVITAMIN WITH MINERALS) TABS tablet   OXYGEN    predniSONE  (DELTASONE ) 10 MG tablet   sacubitril -valsartan  (ENTRESTO ) 24-26 MG   simvastatin  (ZOCOR ) 20 MG tablet   tamsulosin  (FLOMAX ) 0.4 MG CAPS capsule   Tiotropium Bromide -Olodaterol (STIOLTO RESPIMAT ) 2.5-2.5 MCG/ACT AERS    Isaiah Ruder, PA-C Surgical Short Stay/Anesthesiology [facility] Phone [phone] [facility], [facility], [facility] Phone [phone] 09/30/2023 5:01 PM

## 2023-09-30 NOTE — Patient Instructions (Signed)
 Go forward with bronchoscopy as planned tomorrow  Dr Gretta is doing the bronchoscopy and she will set up plans unless she asks us  to do that.  I will schedule to see you for follow-up in 4 months, but that can be changed.  If you wish, you can make an appointment with [facility] to look at portable oxygen  options.

## 2023-09-30 NOTE — Telephone Encounter (Signed)
Sent medication into mail order pharmacy.

## 2023-09-30 NOTE — Progress Notes (Signed)
 PCP - Dr. Aloysius Mech  Cardiologist - Dr. Dorn Lesches  PPM/ICD - denies   Chest x-ray - 09/18/23 EKG - 09/14/23 Stress Test - 05/24/11 ECHO - 09/20/23 Cardiac Cath - 03/13/23  CPAP - denies  Fasting Blood Sugar - 160's Checks Blood Sugar once/day  Pt last took Trulicity  on 12/28 and knows not to take before surgery Jardiance - (Pt last took on 1/13 AM)- was not instructed to hold 72 hours. Pt knows not to take any more doses before surgery  Blood Thinner Instructions: Plavix - last taken 09/23/23, pt knows not to take before surgery Aspirin  Instructions: n/a  ERAS Protcol - no, NPO  COVID TEST- n/a  Anesthesia review: yes, message sent to [place], [place]. Cardiac hx, 2L O2 at bedtime and PRN  Patient verbally denies any shortness of breath, fever, cough and chest pain during phone call   -------------  SDW INSTRUCTIONS given:  Your procedure is scheduled on 1/14.  Report to [facility] Main Entrance A at 0630 A.M., and check in at the Admitting office.  Any questions or running late day of surgery: call [phone]    Remember:  Do not eat or drink after midnight the night before your surgery      Take these medicines the morning of surgery with A SIP OF WATER  Albuterol  PRN- bring inhaler Gabapentin  Metoprolol  Prednisone  Simvastatin  Flomax  Stiolto respimat       As of today, STOP taking any Aspirin  (unless otherwise instructed by your surgeon) Aleve, Naproxen, Ibuprofen, Motrin, Advil, Goody's, BC's, all herbal medications, fish oil, and all vitamins.  WHAT DO I DO ABOUT MY DIABETES MEDICATION?   Do not take Metformin  the morning of surgery.  Hold Trulicity  for 7 days prior to surgery.       Hold Jardiance  72 hours (Pt last took on 1/13 AM)   HOW TO MANAGE YOUR DIABETES BEFORE AND AFTER SURGERY  Why is it important to control my blood sugar before and after surgery? Improving blood sugar levels before and after surgery helps healing and can limit  problems. A way of improving blood sugar control is eating a healthy diet by:  Eating less sugar and carbohydrates  Increasing activity/exercise  Talking with your doctor about reaching your blood sugar goals High blood sugars (greater than 180 mg/dL) can raise your risk of infections and slow your recovery, so you will need to focus on controlling your diabetes during the weeks before surgery. Make sure that the doctor who takes care of your diabetes knows about your planned surgery including the date and location.  How do I manage my blood sugar before surgery? Check your blood sugar at least 4 times a day, starting 2 days before surgery, to make sure that the level is not too high or low.  Check your blood sugar the morning of your surgery when you wake up and every 2 hours until you get to the Short Stay unit.  If your blood sugar is less than 70 mg/dL, you will need to treat for low blood sugar: Do not take insulin . Treat a low blood sugar (less than 70 mg/dL) with  cup of clear juice (cranberry or apple), 4 glucose tablets, OR glucose gel. Recheck blood sugar in 15 minutes after treatment (to make sure it is greater than 70 mg/dL). If your blood sugar is not greater than 70 mg/dL on recheck, call [phone] for further instructions. Report your blood sugar to the short stay nurse when you get to Short Stay.  If you are admitted to the hospital after surgery: Your blood sugar will be checked by the staff and you will probably be given insulin  after surgery (instead of oral diabetes medicines) to make sure you have good blood sugar levels. The goal for blood sugar control after surgery is 80-180 mg/dL.   Do NOT Smoke (Tobacco/Vaping) 24 hours prior to your procedure  If you use a CPAP at night, you may bring all equipment for your overnight stay.     You will be asked to remove any contacts, glasses, piercing's, hearing aid's, dentures/partials prior to surgery. Please bring cases  for these items if needed.     Patients discharged the day of surgery will not be allowed to drive home, and someone needs to stay with them for 24 hours.  SURGICAL WAITING ROOM VISITATION Patients may have no more than 2 support people in the waiting area - these visitors may rotate.   Pre-op nurse will coordinate an appropriate time for 1 ADULT support person, who may not rotate, to accompany patient in pre-op.  Children under the age of 65 must have an adult with them who is not the patient and must remain in the main waiting area with an adult.  If the patient needs to stay at the hospital during part of their recovery, the visitor guidelines for inpatient rooms apply.  Please refer to the [facility] website for the visitor guidelines for any additional information.   Special instructions:   [place]- Preparing For Surgery   Please follow these instructions carefully.   Shower the NIGHT BEFORE SURGERY and the MORNING OF SURGERY with DIAL Soap.   Pat yourself dry with a CLEAN TOWEL.  Wear CLEAN PAJAMAS to bed the night before surgery  Place CLEAN SHEETS on your bed the night of your first shower and DO NOT SLEEP WITH PETS.   Additional instructions for the day of surgery: DO NOT APPLY any lotions, deodorants, cologne, or perfumes.   Do not wear jewelry or makeup Do not wear nail polish, gel polish, artificial nails, or any other type of covering on natural nails (fingers and toes) Do not bring valuables to the hospital. [facility] is not responsible for valuables/personal belongings. Put on clean/comfortable clothes.  Please brush your teeth.  Ask your nurse before applying any prescription medications to the skin.    Questions were answered. Patient verbalized understanding of instructions.

## 2023-09-30 NOTE — Assessment & Plan Note (Signed)
 Plan- bronchoscopy 1/145/25 by Dr Chestine Spore, who will get path result and arrange f/u unless she asks me to do so.

## 2023-10-01 ENCOUNTER — Other Ambulatory Visit: Payer: Self-pay | Admitting: Internal Medicine

## 2023-10-01 ENCOUNTER — Encounter (HOSPITAL_COMMUNITY)
Admission: RE | Disposition: A | Discharge status: Home / Self Care | Payer: Self-pay | Source: Home / Self Care | Attending: Pulmonary Disease

## 2023-10-01 ENCOUNTER — Ambulatory Visit (HOSPITAL_COMMUNITY): Payer: Self-pay | Admitting: Vascular Surgery

## 2023-10-01 ENCOUNTER — Ambulatory Visit (HOSPITAL_COMMUNITY): Payer: Medicare Other

## 2023-10-01 ENCOUNTER — Ambulatory Visit (HOSPITAL_COMMUNITY)
Admission: RE | Admit: 2023-10-01 | Discharge: 2023-10-01 | Disposition: A | Discharge status: Home / Self Care | Payer: Medicare Other | Attending: Pulmonary Disease | Admitting: Pulmonary Disease

## 2023-10-01 ENCOUNTER — Other Ambulatory Visit: Payer: Self-pay

## 2023-10-01 ENCOUNTER — Encounter (HOSPITAL_COMMUNITY): Payer: Self-pay | Admitting: Pulmonary Disease

## 2023-10-01 ENCOUNTER — Ambulatory Visit (HOSPITAL_BASED_OUTPATIENT_CLINIC_OR_DEPARTMENT_OTHER): Payer: Self-pay | Admitting: Vascular Surgery

## 2023-10-01 DIAGNOSIS — E1151 Type 2 diabetes mellitus with diabetic peripheral angiopathy without gangrene: Secondary | ICD-10-CM | POA: Insufficient documentation

## 2023-10-01 DIAGNOSIS — J449 Chronic obstructive pulmonary disease, unspecified: Secondary | ICD-10-CM | POA: Diagnosis not present

## 2023-10-01 DIAGNOSIS — C3412 Malignant neoplasm of upper lobe, left bronchus or lung: Secondary | ICD-10-CM | POA: Diagnosis not present

## 2023-10-01 DIAGNOSIS — I252 Old myocardial infarction: Secondary | ICD-10-CM | POA: Insufficient documentation

## 2023-10-01 DIAGNOSIS — J189 Pneumonia, unspecified organism: Secondary | ICD-10-CM | POA: Diagnosis not present

## 2023-10-01 DIAGNOSIS — E785 Hyperlipidemia, unspecified: Secondary | ICD-10-CM | POA: Insufficient documentation

## 2023-10-01 DIAGNOSIS — R918 Other nonspecific abnormal finding of lung field: Secondary | ICD-10-CM | POA: Insufficient documentation

## 2023-10-01 DIAGNOSIS — I130 Hypertensive heart and chronic kidney disease with heart failure and stage 1 through stage 4 chronic kidney disease, or unspecified chronic kidney disease: Secondary | ICD-10-CM | POA: Diagnosis not present

## 2023-10-01 DIAGNOSIS — N1832 Chronic kidney disease, stage 3b: Secondary | ICD-10-CM | POA: Diagnosis not present

## 2023-10-01 DIAGNOSIS — I509 Heart failure, unspecified: Secondary | ICD-10-CM | POA: Diagnosis not present

## 2023-10-01 DIAGNOSIS — I5043 Acute on chronic combined systolic (congestive) and diastolic (congestive) heart failure: Secondary | ICD-10-CM

## 2023-10-01 DIAGNOSIS — Z955 Presence of coronary angioplasty implant and graft: Secondary | ICD-10-CM | POA: Insufficient documentation

## 2023-10-01 DIAGNOSIS — E1122 Type 2 diabetes mellitus with diabetic chronic kidney disease: Secondary | ICD-10-CM | POA: Insufficient documentation

## 2023-10-01 DIAGNOSIS — Z9981 Dependence on supplemental oxygen: Secondary | ICD-10-CM | POA: Insufficient documentation

## 2023-10-01 DIAGNOSIS — Z7902 Long term (current) use of antithrombotics/antiplatelets: Secondary | ICD-10-CM | POA: Insufficient documentation

## 2023-10-01 DIAGNOSIS — Z48813 Encounter for surgical aftercare following surgery on the respiratory system: Secondary | ICD-10-CM | POA: Diagnosis not present

## 2023-10-01 DIAGNOSIS — R911 Solitary pulmonary nodule: Secondary | ICD-10-CM | POA: Diagnosis not present

## 2023-10-01 DIAGNOSIS — Z87891 Personal history of nicotine dependence: Secondary | ICD-10-CM | POA: Diagnosis not present

## 2023-10-01 DIAGNOSIS — N182 Chronic kidney disease, stage 2 (mild): Secondary | ICD-10-CM

## 2023-10-01 DIAGNOSIS — I10 Essential (primary) hypertension: Secondary | ICD-10-CM | POA: Diagnosis not present

## 2023-10-01 DIAGNOSIS — Z95820 Peripheral vascular angioplasty status with implants and grafts: Secondary | ICD-10-CM | POA: Insufficient documentation

## 2023-10-01 DIAGNOSIS — I2510 Atherosclerotic heart disease of native coronary artery without angina pectoris: Secondary | ICD-10-CM | POA: Diagnosis not present

## 2023-10-01 DIAGNOSIS — R042 Hemoptysis: Secondary | ICD-10-CM | POA: Insufficient documentation

## 2023-10-01 DIAGNOSIS — J9621 Acute and chronic respiratory failure with hypoxia: Secondary | ICD-10-CM | POA: Diagnosis not present

## 2023-10-01 DIAGNOSIS — J9809 Other diseases of bronchus, not elsewhere classified: Secondary | ICD-10-CM | POA: Diagnosis not present

## 2023-10-01 DIAGNOSIS — J432 Centrilobular emphysema: Secondary | ICD-10-CM | POA: Insufficient documentation

## 2023-10-01 DIAGNOSIS — Z7984 Long term (current) use of oral hypoglycemic drugs: Secondary | ICD-10-CM | POA: Insufficient documentation

## 2023-10-01 HISTORY — PX: BRONCHIAL BRUSHINGS: SHX5108

## 2023-10-01 HISTORY — PX: HEMOSTASIS CONTROL: SHX6838

## 2023-10-01 HISTORY — PX: BRONCHIAL BIOPSY: SHX5109

## 2023-10-01 HISTORY — PX: BRONCHIAL WASHINGS: SHX5105

## 2023-10-01 HISTORY — PX: VIDEO BRONCHOSCOPY: SHX5072

## 2023-10-01 LAB — GLUCOSE, CAPILLARY
Glucose-Capillary: 123 mg/dL — ABNORMAL HIGH (ref 70–99)
Glucose-Capillary: 121 mg/dL — ABNORMAL HIGH (ref 70–99)
Glucose-Capillary: 107 mg/dL — ABNORMAL HIGH (ref 70–99)

## 2023-10-01 SURGERY — VIDEO BRONCHOSCOPY WITHOUT FLUORO
Anesthesia: General

## 2023-10-01 MED ORDER — PROPOFOL 500 MG/50ML IV EMUL
INTRAVENOUS | Status: DC | PRN
  Administered 2023-10-01: 150 ug/kg/min via INTRAVENOUS

## 2023-10-01 MED ORDER — FENTANYL CITRATE (PF) 100 MCG/2ML IJ SOLN
INTRAMUSCULAR | Status: AC
  Filled 2023-10-01: qty 2

## 2023-10-01 MED ORDER — METOPROLOL SUCCINATE ER 25 MG PO TB24
100.0000 mg | ORAL_TABLET | Freq: Once | ORAL | Status: AC
  Administered 2023-10-01: 100 mg via ORAL
  Filled 2023-10-01: qty 4

## 2023-10-01 MED ORDER — ONDANSETRON HCL 4 MG/2ML IJ SOLN
INTRAMUSCULAR | Status: DC | PRN
  Administered 2023-10-01: 4 mg via INTRAVENOUS

## 2023-10-01 MED ORDER — PHENYLEPHRINE 80 MCG/ML (10ML) SYRINGE FOR IV PUSH (FOR BLOOD PRESSURE SUPPORT)
PREFILLED_SYRINGE | INTRAVENOUS | Status: DC | PRN
  Administered 2023-10-01: 240 ug via INTRAVENOUS

## 2023-10-01 MED ORDER — INSULIN ASPART 100 UNIT/ML IJ SOLN: 0.0000 [IU] | INTRAMUSCULAR | Status: DC | PRN

## 2023-10-01 MED ORDER — PHENYLEPHRINE HCL-NACL 20-0.9 MG/250ML-% IV SOLN
INTRAVENOUS | Status: DC | PRN
  Administered 2023-10-01: 30 ug/min via INTRAVENOUS

## 2023-10-01 MED ORDER — LACTATED RINGERS IV SOLN: INTRAVENOUS | Status: DC

## 2023-10-01 MED ORDER — PROPOFOL 10 MG/ML IV BOLUS
INTRAVENOUS | Status: DC | PRN
  Administered 2023-10-01: 150 mg via INTRAVENOUS

## 2023-10-01 MED ORDER — LIDOCAINE 2% (20 MG/ML) 5 ML SYRINGE
INTRAMUSCULAR | Status: DC | PRN
  Administered 2023-10-01: 60 mg via INTRAVENOUS

## 2023-10-01 MED ORDER — FENTANYL CITRATE (PF) 250 MCG/5ML IJ SOLN
INTRAMUSCULAR | Status: DC | PRN
  Administered 2023-10-01: 50 ug via INTRAVENOUS

## 2023-10-01 MED ORDER — SUGAMMADEX SODIUM 200 MG/2ML IV SOLN
INTRAVENOUS | Status: DC | PRN
  Administered 2023-10-01: 200 mg via INTRAVENOUS

## 2023-10-01 MED ORDER — CHLORHEXIDINE GLUCONATE 0.12 % MT SOLN
15.0000 mL | Freq: Once | OROMUCOSAL | Status: AC
  Administered 2023-10-01: 15 mL via OROMUCOSAL
  Filled 2023-10-01: qty 15

## 2023-10-01 MED ORDER — EPINEPHRINE 1 MG/10ML IJ SOSY
PREFILLED_SYRINGE | INTRAMUSCULAR | Status: AC
  Filled 2023-10-01: qty 10

## 2023-10-01 MED ORDER — ROCURONIUM BROMIDE 10 MG/ML (PF) SYRINGE
PREFILLED_SYRINGE | INTRAVENOUS | Status: DC | PRN
  Administered 2023-10-01: 50 mg via INTRAVENOUS

## 2023-10-01 MED ORDER — SODIUM CHLORIDE (PF) 0.9 % IJ SOLN
PREFILLED_SYRINGE | INTRAMUSCULAR | Status: DC | PRN
  Administered 2023-10-01 (×2): 4 mL

## 2023-10-01 MED ORDER — DEXAMETHASONE SODIUM PHOSPHATE 10 MG/ML IJ SOLN
INTRAMUSCULAR | Status: DC | PRN
  Administered 2023-10-01: 10 mg via INTRAVENOUS

## 2023-10-01 NOTE — Discharge Instructions (Signed)
Flexible Bronchoscopy, Care After This sheet gives you information about how to care for yourself after your test. Your doctor may also give you more specific instructions. If you have problems or questions, contact your doctor. Follow these instructions at home: Eating and drinking Do not eat or drink anything (not even water) for 2 hours after your test, or until your numbing medicine (local anesthetic) wears off. When your numbness is gone and your cough and gag reflexes have come back, you may: Eat only soft foods. Slowly drink liquids. The day after the test, go back to your normal diet. Driving Do not drive for 24 hours if you were given a medicine to help you relax (sedative). Do not drive or use heavy machinery while taking prescription pain medicine. General instructions  Take over-the-counter and prescription medicines only as told by your doctor. Return to your normal activities as told. Ask what activities are safe for you. Do not use any products that have nicotine or tobacco in them. This includes cigarettes and e-cigarettes. If you need help quitting, ask your doctor. Keep all follow-up visits as told by your doctor. This is important. It is very important if you had a tissue sample (biopsy) taken. Get help right away if: You have shortness of breath that gets worse. You get light-headed. You feel like you are going to pass out (faint). You have chest pain. You cough up: More than a little blood. More blood than before. Summary Do not eat or drink anything (not even water) for 2 hours after your test, or until your numbing medicine wears off. Do not use cigarettes. Do not use e-cigarettes. Get help right away if you have chest pain.  This information is not intended to replace advice given to you by your health care provider. Make sure you discuss any questions you have with your health care provider. Document Released: 07/01/2009 Document Revised: 08/16/2017 Document  Reviewed: 09/21/2016 Elsevier Patient Education  2020 Elsevier Inc.  

## 2023-10-01 NOTE — Transfer of Care (Signed)
 Immediate Anesthesia Transfer of Care Note  Patient: Jonathan [name].  Procedure(s) Performed: VIDEO BRONCHOSCOPY WITHOUT FLUORO BRONCHIAL BRUSHINGS BRONCHIAL BIOPSIES BRONCHIAL WASHINGS HEMOSTASIS CONTROL  Patient Location: PACU  Anesthesia Type:General  Level of Consciousness: awake, alert , and oriented  Airway & Oxygen  Therapy: Patient Spontanous Breathing and Patient connected to face mask oxygen   Post-op Assessment: Report given to RN and Post -op Vital signs reviewed and stable  Post vital signs: Reviewed and stable  Last Vitals:  Vitals Value Taken Time  BP 91/49 10/01/23 0949  Temp    Pulse 88 10/01/23 0954  Resp 13 10/01/23 0954  SpO2 86 % 10/01/23 0954  Vitals shown include unfiled device data.  Last Pain:  Vitals:   10/01/23 0738  TempSrc:   PainSc: 0-No pain         Complications: No notable events documented.

## 2023-10-01 NOTE — Op Note (Signed)
 Video Bronchoscopy Procedure Note  Date of Operation: 10/01/2023  Pre-op Diagnosis: lung mass   Post-op Diagnosis: lung mass  Surgeon: Adine CROME Soham Hollett,DO  Assistants: none  Anesthesia: General  Operation: Flexible video fiberoptic bronchoscopy and biopsies.  Estimated Blood Loss: <1cc  Complications: none noted  Indications and History: Jonathan [name]. is 88 y.o. with history of lung.  Recommendation was to perform video fiberoptic bronchoscopy with biopsies. The risks, benefits, complications, treatment options and expected outcomes were discussed with the patient.  The possibilities of pneumothorax, pneumonia, reaction to medication, pulmonary aspiration, perforation of a viscus, bleeding, failure to diagnose a condition and creating a complication requiring transfusion or operation were discussed with the patient who freely signed the consent.    Description of Procedure: The patient was seen in the Preoperative Area, was examined and was deemed appropriate to proceed.  The patient was taken to endo 3, identified as [name] [name]. and the procedure verified as Flexible Video Fiberoptic Bronchoscopy.  A Time Out was held and the above information confirmed.   Standard therapeutic scope was used for examination of the bilateral mainstem's.  There was normal-appearing right lung, normal-appearing left lung except the anterior apical segment of the left lung.  There is visible tumor in the left upper lobe apical segment.  Cytology brushings were used to obtain specimens as well as cook captura of forceps.  1: 10,000 dilution of epinephrine  and I saline was used for hemostasis.  There was no active bleeding from the site.  Standard scope was brought to just above the main carina there was no active bleeding and scope was brought from the patient's airway. Samples: 1. Endobronchial brushings from left upper lobe 2. Endobronchial forceps biopsies from left upper lobe  Plans:  We  will review the cytology, pathology and microbiology results with the patient when they become available.  Outpatient followup will be with Dr. Adine CROME Gift, DO   Adine CROME Gift, DO [facility] 10/01/2023 9:44 AM

## 2023-10-01 NOTE — Anesthesia Procedure Notes (Addendum)
 Procedure Name: Intubation Date/Time: 10/01/2023 9:14 AM  Performed by: Hedy Jarred, CRNAPre-anesthesia Checklist: Patient identified, Emergency Drugs available, Suction available and Patient being monitored Patient Re-evaluated:Patient Re-evaluated prior to induction Oxygen  Delivery Method: Circle System Utilized Preoxygenation: Pre-oxygenation with 100% oxygen  Induction Type: IV induction Ventilation: Mask ventilation without difficulty Laryngoscope Size: Mac and 4 Tube type: Oral Tube size: 8.5 mm Number of attempts: 1 Airway Equipment and Method: Stylet and Oral airway Placement Confirmation: ETT inserted through vocal cords under direct vision, positive ETCO2 and breath sounds checked- equal and bilateral Secured at: 23 cm Tube secured with: Tape Dental Injury: Teeth and Oropharynx as per pre-operative assessment

## 2023-10-01 NOTE — Interval H&P Note (Signed)
 History and Physical Interval Note:  10/01/2023 8:54 AM  Jonathan [name].  has presented today for surgery, with the diagnosis of lung nodule LUL.  The various methods of treatment have been discussed with the patient and family. After consideration of risks, benefits and other options for treatment, the patient has consented to  Procedure(s): ROBOTIC ASSISTED NAVIGATIONAL BRONCHOSCOPY (N/A) as a surgical intervention.  The patient's history has been reviewed, patient examined, no change in status, stable for surgery.  I have reviewed the patient's chart and labs.  Questions were answered to the patient's satisfaction.     Adine CROME Jemiah Cuadra

## 2023-10-01 NOTE — Anesthesia Postprocedure Evaluation (Signed)
 Anesthesia Post Note  Patient: Jonathan E Reddin Jr.  Procedure(s) Performed: VIDEO BRONCHOSCOPY WITHOUT FLUORO BRONCHIAL BRUSHINGS BRONCHIAL BIOPSIES BRONCHIAL WASHINGS HEMOSTASIS CONTROL     Patient location during evaluation: PACU Anesthesia Type: General Level of consciousness: awake and alert Pain management: pain level controlled Vital Signs Assessment: post-procedure vital signs reviewed and stable Respiratory status: spontaneous breathing, nonlabored ventilation, respiratory function stable and patient connected to nasal cannula oxygen  Cardiovascular status: blood pressure returned to baseline and stable Postop Assessment: no apparent nausea or vomiting Anesthetic complications: no   No notable events documented.  Last Vitals:  Vitals:   10/01/23 1023 10/01/23 1030  BP: (!) 101/57 (!) 102/58  Pulse: 82 79  Resp: 17 17  Temp:  36.5 C  SpO2: 91% 91%    Last Pain:  Vitals:   10/01/23 1023  TempSrc:   PainSc: 0-No pain                 Garnette DELENA Gab

## 2023-10-02 ENCOUNTER — Other Ambulatory Visit: Payer: Self-pay | Admitting: *Deleted

## 2023-10-02 NOTE — Patient Outreach (Signed)
  Care Management  Transitions of Care Program Transitions of Care Post-discharge week 2/ day # 8  10/02/2023 Name: Jonathan [name]. MRN: [id number] DOB: [birth year]  Subjective: Jonathan [name]. is a 88 y.o. year old male who is a primary care patient of Ezell Hollow, MD. The Care Management team was unable to reach the patient by phone to assess and address transitions of care needs.   30-day TOC program outreach for week # 2: unsuccessful- ; left HIPAA compliant voice message requesting call back   Plan: Additional outreach attempts will be made to reach the patient enrolled in the [facility] Program (Post Inpatient/ED Visit).  Latrica Clowers Mckinney Liadan Guizar, RN, BSN, Media planner  Transitions of Care  VBCI - [facility] [phone]: direct office

## 2023-10-03 ENCOUNTER — Telehealth: Payer: Self-pay | Admitting: *Deleted

## 2023-10-03 ENCOUNTER — Encounter (HOSPITAL_COMMUNITY): Payer: Self-pay | Admitting: Pulmonary Disease

## 2023-10-03 NOTE — Patient Outreach (Signed)
  Care Management  Transitions of Care Program Transitions of Care Post-discharge week 2/ day # 9  10/03/2023 Name: Jonathan [name]. MRN: [id number] DOB: 04-22-1936  Subjective: Jonathan [name]. is a 88 y.o. year old male who is a primary care patient of Wanda Plump, MD. The Care Management team was unable to reach the patient by phone to assess and address transitions of care needs.   10/03/23:  30-day TOC program outreach for week # 2: unsuccessful- attempt # 2; left HIPAA compliant voice message requesting call back   Plan: Additional outreach attempts will be made to reach the patient enrolled in the [facility] Program (Post Inpatient/ED Visit).  Caryl Pina, RN, BSN, Media planner  Transitions of Care  VBCI - [facility] [phone]: direct office

## 2023-10-03 NOTE — Patient Outreach (Signed)
Care Management  Transitions of Care Program Transitions of Care Post-discharge week 2/ day # 9   10/03/2023 Name: Francisco Minto. MRN: [id number] DOB: 1936-07-17  Subjective: Curby Bady. is a 88 y.o. year old male who is a primary care patient of Wanda Plump, MD. The Care Management team Engaged with patient Engaged with patient by telephone to assess and address transitions of care needs.   Consent to Services:  Patient was given information about care management services, agreed to services, and gave verbal consent to participate.  Enrolled 09/24/23  Assessment:  "I am doing just fine, not having any problems at all; the bronchoscopy procedure went well and I have no questions.  If the papers they gave me after the bronchoscopy tell me to re-start the plavix, then that is what I will do- I don't think I need to call them, if they put it on the papers to re-start taking... so I will re-start it.  The home health PT people are coming out, but they told me that I am doing so well- they won't need to come except maybe one or two more times."    Patient denies specific clinical concerns today and sounds to be in no distress throughout [facility] 30-day program outreach call today          SDOH Interventions    Flowsheet Row Telephone from 09/24/2023 in [facility] Office Visit from 04/11/2022 in [facility] Primary Care at [facility] Clinical Support from 07/20/2021 in [facility] Primary Care at [facility] Office Visit from 10/17/2020 in [facility] Primary Care at [facility]  SDOH Interventions      Food Insecurity Interventions Intervention Not Indicated -- -- --  Housing Interventions Intervention Not Indicated -- -- --  Transportation Interventions Intervention Not Indicated  [Drives self] -- -- --  Utilities Interventions Intervention Not Indicated -- -- --  Depression Interventions/Treatment  -- PHQ2-9  Score <4 Follow-up Not Indicated -- PHQ2-9 Score <4 Follow-up Not Indicated  Physical Activity Interventions -- -- Intervention Not Indicated  [pt states he stays active doing things around the house] --        Goals Addressed             This Visit's Progress    TOC 30-day Program Care Plan   On track    Current Barriers:  Medication access Full medication review completed 09/24/23: patient reported he is not/ has not been taking potassium; verified through extensive efforts this was prescribed at time of 02/22/23 hospital discharge- provided to patient by [facility] pharmacy- with no refills; sent care coordination outreach to cardiology provider through review of EHR, requesting clarification; noted patient's most recent postassium level is 4.5 (09/18/23)  Provider appointments Patient has been instructed to attend 10/01/23 brochoscopy with Dr. Tonia Brooms at the "[facility];" No address, time, or phone number was included on printed AVS: patient does not know what or where/ what time he should go:  MULTIPLE care coordination outreaches completed with [facility]/ [facility] / pulmonary provider (Dr. Icard)/ eventual endoscopy department at [facility]; eventually and with much effort obtained information that patient is to go to the main hospital facility [facility]) at 9:00 am on 10/01/23: confirmed that he will be contacted by staff closer to 10/01/23-- if patient has questions in the mean time- he is to call and ask for Shannon/ respiratory coordinator at the endoscopy department [phone]) and/  or the short stay department at [phone]) Home Health services Confirmed PT/OT home health services referral sent to [facility]: patient has not heard from [facility], and there is no information included for patient on his discharge instructions; care coordination outreach completed with [facility]: [phone]: spoke with [facility]; prior to signing of TOC note: confirmed patient has scheduled PT initial home visit  for tomorrow 09/25/23   Hospitalized 09/09/23- 09/22/22: hemoptysis with new diagnosis of LUL mass; multiple hospitalizations over last 6 months:  3 total with ED visits as well  Independent in self-care; resides with supportive spouse; no SDOH barriers identified  RNCM Clinical Goal(s):  Patient will work with the Care Management team over the next 30 days to address Transition of Care Barriers: Medication access Medication Management Provider appointments Home Health services through collaboration with RN Care manager, provider, and care team.   Interventions: Evaluation of current treatment plan related to  self management and patient's adherence to plan as established by provider  10/02/23:  30-day TOC program outreach for week # 2: unsuccessful- attempt # 1; left HIPAA compliant voice message requesting call back  10/03/23:  30-day TOC program outreach for week # 2: unsuccessful- attempt # 2; left HIPAA compliant voice message requesting call back: patient returned my call and week # 2 outreach successfully completed  Transitions of Care:  Goal on track:  Yes. 10/03/23 Durable Medical Equipment (DME) needs assessed with patient/caregiver Doctor Visits  - discussed the importance of doctor visits Reviewed recent PCP HFU OV on 09/27/23/ pulmonary provider HFU OV on 09/30/23/ scheduled bronchoscopy on 10/01/23 with patient- he verbalizes good understanding of all and denies questions/ concerns post- visits Discussed resumption of clopidogrel (Plavix) with patient: he has not currently resumed taking- states "nobody has told me whether or not I should re-start it;" reviewed post- pulmonary OV instructions as well as post-bronchoscopy instructions with patient: both indicated that he should resume taking; patient verbalized understanding and agreement with this: however-- given his recent admission for hemoptysis-- I encouraged patient to consider contacting pulmonary provider if he has any  questions or concerns, given his report that he was not specifically/ verbally instructed to resume: he verbalized understanding of same, but states that "if the papers tell me to re-start it, then that is what I will do" Discussed current clinical condition: "I am doing just fine, not having any problems at all; the bronchoscopy procedure went well and I have no questions.  If the papers they gave me after the bronchoscopy tell me to re-start the plavix, then that is what I will do- I don't think I need to call them, if they put it on the papers to re-start taking... so I will re-start it.  The home health PT people are coming out, but they told me that I am doing so well- they won't need to come except maybe one or two more times."  Patient denies specific clinical concerns today and sounds to be in no distress throughout [facility] 30-day program outreach call today Home Health PT services- confirmed active; encouraged patient's active participation/ engagement as long as they are active  Patient Goals/Self-Care Activities: Participate in Transition of Care Program/Attend TOC scheduled calls Take all medications as prescribed Attend all scheduled provider appointments Perform all self care activities independently  Call provider office for new concerns or questions  Continue pacing activity as your recuperation from recent surgery continues Continue using home oxygen as prescribed Continue working with the home health team that is involved in  your care If you believe your condition is getting worse- contact your care providers (doctors) promptly- reaching out to your doctor early when you have concerns can prevent you from having to go to the hospital  Follow Up Plan:  Telephone follow up appointment with care management team member scheduled for:  Wednesday 10/09/23 at 10:00 am          Plan: Telephone follow up appointment with care management team member scheduled for:     Wednesday 10/09/23 at  10:00 am    Caryl Pina, RN, BSN, Media planner  Transitions of Care  VBCI - [facility] [phone]: direct office

## 2023-10-03 NOTE — Patient Instructions (Signed)
Visit Information  Thank you for taking time to visit with me today. Please don't hesitate to contact me if I can be of assistance to you before our next scheduled telephone appointment.  Our next appointment is by telephone on Wednesday, 10/09/23 at 10:00 am  Please call the care guide team at [phone] if you need to cancel or reschedule your appointment.   Following are the goals we discussed today:  Patient Goals/Self-Care Activities: Participate in Transition of Care Program/Attend TOC scheduled calls Take all medications as prescribed Attend all scheduled provider appointments Perform all self care activities independently  Call provider office for new concerns or questions  Continue pacing activity as your recuperation from recent surgery continues Continue using home oxygen as prescribed Continue working with the home health team that is involved in your care If you believe your condition is getting worse- contact your care providers (doctors) promptly- reaching out to your doctor early when you have concerns can prevent you from having to go to the hospital  If you are experiencing a Mental Health or Behavioral Health Crisis or need someone to talk to, please  call the Suicide and Crisis Lifeline: 988 call the [place] National Suicide Prevention Lifeline: 905-093-5454 or TTY: 518-682-1062 TTY (662) 836-0459) to talk to a trained counselor call 1-800-273-TALK (toll free, 24 hour hotline) go to [facility] [address] [place] [phone]) call the [facility] Crisis Line: (928)543-4264 call 911   Patient verbalizes understanding of instructions and care plan provided today and agrees to view in MyChart. Active MyChart status and patient understanding of how to access instructions and care plan via MyChart confirmed with patient.     Caryl Pina, RN, BSN, Media planner  Transitions of Care  [facility] -  [facility] [phone]: direct office

## 2023-10-03 NOTE — Telephone Encounter (Signed)
Primary Information  Source  Takota, Jacquez. (Patient)   Subject  Spike, Ekis. (Patient)   Topic  General - Other    Communication  Reason for CRM: Patient returning missed call from Ricke Hey, Charity fundraiser. Su Hilt was unavailable. Please call patient back when available.     Patient Information  Patient Name Gender DOB SSN  Elie, Mcelroy. Male 11/23/1935 [id number]   Contacts  Contact Date/Time Type Contact Phone/Fax  10/03/2023 10:34 AM EST Phone (Incoming) Christerpher, Kishi. [phone]

## 2023-10-04 ENCOUNTER — Telehealth: Payer: Self-pay

## 2023-10-04 DIAGNOSIS — E1122 Type 2 diabetes mellitus with diabetic chronic kidney disease: Secondary | ICD-10-CM | POA: Diagnosis not present

## 2023-10-04 DIAGNOSIS — I2510 Atherosclerotic heart disease of native coronary artery without angina pectoris: Secondary | ICD-10-CM | POA: Diagnosis not present

## 2023-10-04 DIAGNOSIS — J9621 Acute and chronic respiratory failure with hypoxia: Secondary | ICD-10-CM | POA: Diagnosis not present

## 2023-10-04 DIAGNOSIS — N1832 Chronic kidney disease, stage 3b: Secondary | ICD-10-CM | POA: Diagnosis not present

## 2023-10-04 DIAGNOSIS — I130 Hypertensive heart and chronic kidney disease with heart failure and stage 1 through stage 4 chronic kidney disease, or unspecified chronic kidney disease: Secondary | ICD-10-CM | POA: Diagnosis not present

## 2023-10-04 DIAGNOSIS — E785 Hyperlipidemia, unspecified: Secondary | ICD-10-CM | POA: Diagnosis not present

## 2023-10-04 DIAGNOSIS — J181 Lobar pneumonia, unspecified organism: Secondary | ICD-10-CM | POA: Diagnosis not present

## 2023-10-04 DIAGNOSIS — J440 Chronic obstructive pulmonary disease with acute lower respiratory infection: Secondary | ICD-10-CM | POA: Diagnosis not present

## 2023-10-04 DIAGNOSIS — J439 Emphysema, unspecified: Secondary | ICD-10-CM | POA: Diagnosis not present

## 2023-10-04 DIAGNOSIS — E1151 Type 2 diabetes mellitus with diabetic peripheral angiopathy without gangrene: Secondary | ICD-10-CM | POA: Diagnosis not present

## 2023-10-04 DIAGNOSIS — I5042 Chronic combined systolic (congestive) and diastolic (congestive) heart failure: Secondary | ICD-10-CM | POA: Diagnosis not present

## 2023-10-04 DIAGNOSIS — I083 Combined rheumatic disorders of mitral, aortic and tricuspid valves: Secondary | ICD-10-CM | POA: Diagnosis not present

## 2023-10-04 LAB — CYTOLOGY - NON PAP

## 2023-10-04 NOTE — Telephone Encounter (Signed)
Plan of Care signed and faxed back to [facility] at [phone]. Form sent for scanning.

## 2023-10-07 ENCOUNTER — Ambulatory Visit: Payer: Medicare Other | Admitting: Internal Medicine

## 2023-10-07 LAB — CYTOLOGY - NON PAP

## 2023-10-09 ENCOUNTER — Other Ambulatory Visit: Payer: Self-pay | Admitting: *Deleted

## 2023-10-09 NOTE — Patient Instructions (Signed)
Visit Information  Thank you for taking time to visit with me today. Please don't hesitate to contact me if I can be of assistance to you before our next scheduled telephone appointment.  Our next appointment is by telephone on Thursday 10/16/23 at 10:30 am  Please call the care guide team at [phone] if you need to cancel or reschedule your appointment.   Following are the goals we discussed today:  Patient Goals/Self-Care Activities: Participate in Transition of Care Program/Attend TOC scheduled calls Take all medications as prescribed Attend all scheduled provider appointments Perform all self care activities independently  Call provider office for new concerns or questions  Continue using home oxygen as prescribed Continue following heart healthy/ low salt/ carbohydrate modified diet If you believe your condition is getting worse- contact your care providers (doctors) promptly- reaching out to your doctor early when you have concerns can prevent you from having to go to the hospital  If you are experiencing a Mental Health or Behavioral Health Crisis or need someone to talk to, please  call the Suicide and Crisis Lifeline: 988 call the [place] National Suicide Prevention Lifeline: 231 536 2748 or TTY: (631) 657-4924 TTY (416)358-1025) to talk to a trained counselor call 1-800-273-TALK (toll free, 24 hour hotline) go to [facility] [address] [place] [phone]) call the [facility] Crisis Line: 716 110 1671 call 911   Patient verbalizes understanding of instructions and care plan provided today and agrees to view in MyChart. Active MyChart status and patient understanding of how to access instructions and care plan via MyChart confirmed with patient.     Caryl Pina, RN, BSN, Media planner  Transitions of Care  [facility] - [facility] [phone]: direct office '

## 2023-10-09 NOTE — Patient Outreach (Signed)
Care Management  Transitions of Care Program Transitions of Care Post-discharge week 3/ day # 15   10/09/2023 Name: Jonathan [name]. MRN: [id number] DOB: May 15, 1936  Subjective: Jonathan [name]. is a 88 y.o. year old male who is a primary care patient of Wanda Plump, MD. The Care Management team Engaged with patient Engaged with patient by telephone to assess and address transitions of care needs.   Consent to Services:  Patient was given information about care management services, agreed to services, and gave verbal consent to participate.  Enrolled 09/24/23  Assessment: "I am still doing just fine, not having any problems at all; they called me from the doctor office for the breathing doctor and I have an appointment on 10/15/23 to discuss the results of the bronchoscopy they did last week- I hope it is not bad news; home health has signed off, and I am doing fine.  Taking all of my medicine just like they told me to; I have not had any more bleeding or any more coughing since I came home from the hospital"    Patient denies specific clinical concerns today and sounds to be in no distress throughout [facility] 30-day program outreach call today          SDOH Interventions    Flowsheet Row Telephone from 09/24/2023 in [facility] POPULATION HEALTH DEPARTMENT Office Visit from 04/11/2022 in [facility] Primary Care at [facility] Clinical Support from 07/20/2021 in [facility] Primary Care at [facility] Office Visit from 10/17/2020 in [facility] Primary Care at [facility]  SDOH Interventions      Food Insecurity Interventions Intervention Not Indicated -- -- --  Housing Interventions Intervention Not Indicated -- -- --  Transportation Interventions Intervention Not Indicated  [Drives self] -- -- --  Utilities Interventions Intervention Not Indicated -- -- --  Depression Interventions/Treatment  -- PHQ2-9 Score <4 Follow-up Not Indicated --  PHQ2-9 Score <4 Follow-up Not Indicated  Physical Activity Interventions -- -- Intervention Not Indicated  [pt states he stays active doing things around the house] --        Goals Addressed             This Visit's Progress    TOC 30-day Program Care Plan   On track    Current Barriers:  Medication access Full medication review completed 09/24/23: patient reported he is not/ has not been taking potassium; verified through extensive efforts this was prescribed at time of 02/22/23 hospital discharge- provided to patient by [facility] pharmacy- with no refills; sent care coordination outreach to cardiology provider through review of EHR, requesting clarification; noted patient's most recent postassium level is 4.5 (09/18/23)  Provider appointments Patient has been instructed to attend 10/01/23 brochoscopy with Dr. Tonia Brooms at the "[facility];" No address, time, or phone number was included on printed AVS: patient does not know what or where/ what time he should go:  MULTIPLE care coordination outreaches completed with [facility]/ [facility] / pulmonary provider (Dr. Icard)/ eventual endoscopy department at [facility]; eventually and with much effort obtained information that patient is to go to the main hospital facility [facility]) at 9:00 am on 10/01/23: confirmed that he will be contacted by staff closer to 10/01/23-- if patient has questions in the mean time- he is to call and ask for Shannon/ respiratory coordinator at the endoscopy department [phone]) and/ or the short stay department at [phone]) Home Health services Confirmed  PT/OT home health services referral sent to [facility]: patient has not heard from [facility], and there is no information included for patient on his discharge instructions; care coordination outreach completed with [facility]: [phone]: spoke with [facility]; prior to signing of TOC note: confirmed patient has scheduled PT initial home visit for tomorrow 09/25/23    Hospitalized 09/09/23- 09/22/22: hemoptysis with new diagnosis of LUL mass; multiple hospitalizations over last 6 months:  3 total with ED visits as well  Independent in self-care; resides with supportive spouse; no SDOH barriers identified  RNCM Clinical Goal(s):  Patient will work with the Care Management team over the next 30 days to address Transition of Care Barriers: Medication access Medication Management Provider appointments Home Health services through collaboration with RN Care manager, provider, and care team.   Interventions: Evaluation of current treatment plan related to  self management and patient's adherence to plan as established by provider  Transitions of Care:  Goal on track:  Yes. 10/09/23 Doctor Visits  - discussed the importance of doctor visits Discussed current clinical condition: "I am still doing just fine, not having any problems at all; they called me from the doctor office for the breathing doctor and I have an appointment on 10/15/23 to discuss the results of the bronchoscopy they did last week- I hope it is not bad news; home health has signed off, and I am doing fine.  Taking all of my medicine just like they told me to; I have not had any more bleeding or any more coughing since I came home from the hospital"  Patient denies specific clinical concerns today and sounds to be in no distress throughout [facility] 30-day program outreach call today Home Health PT services- confirmed all disciplines have now signed off Reviewed purpose/ use of prescribed inhalers: however, patient again reports he does not use any of the prescribed inhalers on a regular basis: reports, "I don't need them.  I only use them if I need them, and I have not needed them very often.  My breathing is fine;" he does report that he uses the IS that came home with him post-hospital discharge, "about every day, just for something to do" Confirmed patient occasionally monitors blood sugars at home:  reports he checks "a couple of times each week;" "they are always under 130 or 140;"  reviewed most recent A1C value from 09/09/23: 6.9; reviewed historical trends over time with patient who verbalizes good understanding of same; verbalizes good understanding of significance of A1-C and general A1-C goals Reinforced/ provided education around basics of following heart healthy/ low salt/ carbohydrate modified diet TOC 30-day program initial assessment completed  Patient Goals/Self-Care Activities: Participate in Transition of Care Program/Attend TOC scheduled calls Take all medications as prescribed Attend all scheduled provider appointments Perform all self care activities independently  Call provider office for new concerns or questions  Continue using home oxygen as prescribed Continue following heart healthy/ low salt/ carbohydrate modified diet If you believe your condition is getting worse- contact your care providers (doctors) promptly- reaching out to your doctor early when you have concerns can prevent you from having to go to the hospital  Follow Up Plan:  Telephone follow up appointment with care management team member scheduled for:  Thursday 10/16/23 at 10:30 am          Plan: Telephone follow up appointment with care management team member scheduled for:   Thursday 10/16/23 at 10:30 am  Caryl Pina, RN, BSN, Armed forces logistics/support/administrative officer  Care Manager  Transitions of Care  VBCI - Population Health  [place] [phone]: direct office

## 2023-10-10 ENCOUNTER — Other Ambulatory Visit: Payer: Self-pay

## 2023-10-11 ENCOUNTER — Other Ambulatory Visit: Payer: Self-pay

## 2023-10-11 DIAGNOSIS — J9621 Acute and chronic respiratory failure with hypoxia: Secondary | ICD-10-CM | POA: Diagnosis not present

## 2023-10-11 DIAGNOSIS — E1151 Type 2 diabetes mellitus with diabetic peripheral angiopathy without gangrene: Secondary | ICD-10-CM | POA: Diagnosis not present

## 2023-10-11 DIAGNOSIS — J181 Lobar pneumonia, unspecified organism: Secondary | ICD-10-CM | POA: Diagnosis not present

## 2023-10-11 DIAGNOSIS — I5042 Chronic combined systolic (congestive) and diastolic (congestive) heart failure: Secondary | ICD-10-CM | POA: Diagnosis not present

## 2023-10-11 DIAGNOSIS — J440 Chronic obstructive pulmonary disease with acute lower respiratory infection: Secondary | ICD-10-CM | POA: Diagnosis not present

## 2023-10-11 DIAGNOSIS — I130 Hypertensive heart and chronic kidney disease with heart failure and stage 1 through stage 4 chronic kidney disease, or unspecified chronic kidney disease: Secondary | ICD-10-CM | POA: Diagnosis not present

## 2023-10-11 MED ORDER — TAMSULOSIN HCL 0.4 MG PO CAPS: 0.4000 mg | ORAL_CAPSULE | Freq: Every day | ORAL | 1 refills | Status: AC

## 2023-10-11 NOTE — Progress Notes (Signed)
The proposed treatment discussed in conference is for discussion purpose only and is not a binding recommendation.  The patients have not been physically examined, or presented with their treatment options.  Therefore, final treatment plans cannot be decided.

## 2023-10-14 ENCOUNTER — Encounter: Payer: Self-pay | Admitting: Acute Care

## 2023-10-14 ENCOUNTER — Ambulatory Visit (INDEPENDENT_AMBULATORY_CARE_PROVIDER_SITE_OTHER): Payer: Medicare Other | Admitting: Acute Care

## 2023-10-14 ENCOUNTER — Telehealth: Payer: Self-pay | Admitting: Radiation Oncology

## 2023-10-14 VITALS — BP 132/64 | HR 79 | Temp 97.9°F | Ht 74.0 in | Wt 212.8 lb

## 2023-10-14 DIAGNOSIS — Z87891 Personal history of nicotine dependence: Secondary | ICD-10-CM | POA: Diagnosis not present

## 2023-10-14 DIAGNOSIS — Z9889 Other specified postprocedural states: Secondary | ICD-10-CM

## 2023-10-14 DIAGNOSIS — C3490 Malignant neoplasm of unspecified part of unspecified bronchus or lung: Secondary | ICD-10-CM

## 2023-10-14 DIAGNOSIS — Z87898 Personal history of other specified conditions: Secondary | ICD-10-CM | POA: Diagnosis not present

## 2023-10-14 NOTE — Patient Instructions (Signed)
It is good to see you today. The biopsy of your left upper lobe lung mass was positive for squamous cell lung cancer.  I will refer you to both radiation oncology and medical oncology for treatment.  You will get a call to get both consults scheduled.  Both the medical oncology doctor and the radiation oncology doctor will go over the best options for treatment for you. I have ordered both a PET scan and a MR Brain to complete staging You will get calls to get these scheduled  They will take great care of you. Good luck with your treatment.  Follow up with [place] as needed.  Please contact office for sooner follow up if symptoms do not improve or worsen or seek emergency care

## 2023-10-14 NOTE — Telephone Encounter (Signed)
1/27 @ 4:20 pm Call patient Left voicemail also call and spoke to patient's son (location in [place]) for patient to call our office to be schedule for consult.

## 2023-10-14 NOTE — Progress Notes (Signed)
History of Present Illness Jonathan [name]. is a 88 y.o. male former smoker seen in the ED 09/23/2023 by the inpatient team for acute on chronic respiratory failure , CAP, post obstructive pneumonia. hemoptysis, and chest pain.Imaging revealed a   Large spiculated lingular lingular mass concerning for malignancy. He is followed by Dr. Delton Coombes.   Synopsis 88 year old man with PMHx significant for CAD s/p PCI x2 (12/2021), PVD s/p L SFA stent (2009), HTN, emphysema on nocturnal home O2 @ 2 L [place], DM-2, dyslipidemia, CKD-3b, and CHF (June 2024 EF 40-45%, G1DD) who presented to the ED 09/23/2023  today with chest discomfort and hemoptysis. He developed dry cough initially 09/23/2023 and chest discomfort 09/23/2023 without f/c/r, wheezing, DOE, SOB, LL edema, rash, URTI symptoms, N/V/D, or abd pain. When EMS arrived, he coughed fresh blood, large amount. This is never happened before. O2 was increased from 2 to 4 L [place]. He lost 50 Ib over 2 yrs intentionally and has good appetite. Denied night sweats, trauma, and previous hx of malignancy. Smoked for 30 yrs, 1 ppd, but quit 1978. Denied alcohol and illicit drug use. He was discharged, and scheduled for a bronchoscopy with biopsies by Dr. Tonia Brooms on 10/01/2023( after plavix had been held x 5 days.) He is here today for evaluation post procedure and to review results of cytology.    10/14/2023 Pt. Presents for follow up after Flexible video fiberoptic bronchoscopy and biopsies on 09/30/2022. He states he has done well post procedure. No significant bleeding, infection, discolored secretions , dyspnea beyond his baseline or adverse reaction to anesthesia.  We have reviewed his cytology results. They were positive for squamous cell lung cancer in the LUL. We will refer to radiation oncology and medical oncology for consultation and evaluation for treatment. He will need PET scan and MR brain for staging in addition to referrals.  Pt. Verbalized understanding of the  above.  Test Results:  Cytology 10/01/2023 A. LUNG, LUL ENDOBRONCHIAL LESION, BRUSHING:  - No malignant cells identified  - Benign bronchial cells and pulmonary macrophages   B. LUNG, LUL ENDOBRONCHIAL LESION, BX:  - Squamous cell carcinoma, see comment   C. LUNG, LUL, LAVAGE:    FINAL MICROSCOPIC DIAGNOSIS:  - Suspicious for carcinoma   CT Chest 09/23/2023 Some of the airspace consolidation noted in the left upper lobe on the prior study has resolved. However, there continues to be a aggressive appearing mass-like area in the medial aspect of the left upper lobe which has macrolobulated and spiculated margins measuring approximately 4.7 x 4.1 cm (axial image 78 of series 2) making broad contact with the pleura medially, highly concerning for primary bronchogenic neoplasm. Patchy areas of ground-glass attenuation and septal thickening persist in the periphery of the left upper lobe, likely persistent postobstructive pneumonitis. 6 mm nodule associated with the minor fissure (axial image 73 of series 3), unchanged in retrospect compared to the prior examination. Trace right pleural effusion. No left pleural effusion. Diffuse bronchial wall thickening with moderate centrilobular and paraseptal emphysema.  Persistent aggressive appearing mass in the central aspect of the left upper lobe highly concerning for primary bronchogenic neoplasm. Surrounding postobstructive pneumonia has improved, with residual areas of postobstructive pneumonitis in the left upper lobe noted on today's examination. 2. Trace right pleural effusion. 3. Aortic atherosclerosis, in addition to left main and three-vessel coronary artery disease. 4. Trace amount of pericardial fluid and/or thickening, unlikely to be of any hemodynamic significance at this time. 5. Diffuse bronchial wall thickening  with moderate centrilobular and paraseptal emphysema; imaging findings suggestive of underlying COPD.     Latest Ref Rng & Units 09/27/2023   12:25 PM 09/23/2023    2:04 AM 09/22/2023    1:55 AM  CBC  WBC 3.8 - 10.8 Thousand/uL 14.8  17.5  17.3   Hemoglobin 13.2 - 17.1 g/dL 69.6  29.5  28.4   Hematocrit 38.5 - 50.0 % 40.6  38.0  37.6   Platelets 140 - 400 Thousand/uL 297  321  311        Latest Ref Rng & Units 09/27/2023   12:25 PM 09/18/2023    2:26 AM 09/17/2023    3:41 AM  BMP  Glucose 65 - 99 mg/dL 99  132  440   BUN 7 - 25 mg/dL 26  33  34   Creatinine 0.70 - 1.22 mg/dL 1.02  7.25  3.66   BUN/Creat Ratio 6 - 22 (calc) 20     Sodium 135 - 146 mmol/L 141  134  136   Potassium 3.5 - 5.3 mmol/L 4.0  4.5  4.3   Chloride 98 - 110 mmol/L 104  105  104   CO2 20 - 32 mmol/L 26  21  25    Calcium 8.6 - 10.3 mg/dL 9.5  8.4  8.5     BNP    Component Value Date/Time   BNP 575.0 (H) 03/05/2023 0955   BNP 464.6 (H) 02/21/2023 0338    ProBNP No results found for: "PROBNP"  PFT    Component Value Date/Time   FEV1PRE 2.51 08/13/2018 1055   FVCPRE 3.30 08/13/2018 1055   TLC 6.15 08/13/2018 1055   DLCOUNC 15.32 08/13/2018 1055   PREFEV1FVCRT 76 08/13/2018 1055    DG Chest Port 1 View Result Date: 10/01/2023 CLINICAL DATA:  Status post bronchoscopy. EXAM: PORTABLE CHEST 1 VIEW COMPARISON:  X-ray 09/18/2023.  CT 09/19/2023. FINDINGS: Underinflation. No definite pneumothorax or effusion. Decreasing left lung base opacity and effusion. Some residual patchy opacity left mid to lower lung. Stable cardiopericardial silhouette. Overlapping cardiac leads. IMPRESSION: No pneumothorax seen post bronchoscopy. Electronically Signed   By: Karen Kays M.D.   On: 10/01/2023 10:22   ECHOCARDIOGRAM COMPLETE BUBBLE STUDY Result Date: 09/20/2023    ECHOCARDIOGRAM REPORT   Patient Name:   Jonathan [name]. Date of Exam: 09/20/2023 Medical Rec #:  [id number]           Height:       74.0 in Accession #:    [id number]          Weight:       217.2 lb Date of Birth:  May 27, 1936            BSA:          2.251 m Patient  Age:    87 years            BP:           138/69 mmHg Patient Gender: M                   HR:           50 bpm. Exam Location:  Inpatient Procedure: 2D Echo, Cardiac Doppler, Color Doppler, Saline Contrast Bubble Study            and Intracardiac Opacification Agent Indications:    Acute Resp failure  History:        Patient has prior history of Echocardiogram examinations, most  recent 02/21/2023. CHF, CAD, COPD; Risk Factors:Hypertension,                 Diabetes and Dyslipidemia.  Sonographer:    Lucendia Herrlich Referring Phys: [id number] PRAVEEN MANNAM IMPRESSIONS  1. Left ventricular ejection fraction, by estimation, is 55 to 60%. The left ventricle has normal function. The left ventricle has no regional wall motion abnormalities. There is mild concentric left ventricular hypertrophy. Left ventricular diastolic parameters are consistent with Grade I diastolic dysfunction (impaired relaxation).  2. Right ventricular systolic function is normal. The right ventricular size is normal.  3. Left atrial size was moderately dilated.  4. Mild flail of anterior leaflet.  5. The mitral valve is degenerative. Mild mitral valve regurgitation.  6. The aortic valve is tricuspid. There is mild calcification of the aortic valve. There is mild thickening of the aortic valve. Aortic valve regurgitation is mild. Aortic valve sclerosis/calcification is present, without any evidence of aortic stenosis.  7. There is mild (Grade II) atheroma plaque involving the aortic root and ascending aorta.  8. The inferior vena cava is dilated in size with >50% respiratory variability, suggesting right atrial pressure of 8 mmHg. FINDINGS  Left Ventricle: Left ventricular ejection fraction, by estimation, is 55 to 60%. The left ventricle has normal function. The left ventricle has no regional wall motion abnormalities. Definity contrast agent was given IV to delineate the left ventricular  endocardial borders. The left ventricular  internal cavity size was normal in size. There is mild concentric left ventricular hypertrophy. Left ventricular diastolic parameters are consistent with Grade I diastolic dysfunction (impaired relaxation). Right Ventricle: The right ventricular size is normal. No increase in right ventricular wall thickness. Right ventricular systolic function is normal. Left Atrium: Left atrial size was moderately dilated. Right Atrium: Right atrial size was normal in size. Pericardium: There is no evidence of pericardial effusion. Mitral Valve: Can not exclude vegetation. The mitral valve is degenerative in appearance. Mild flail of the middle segment of the anterior mitral valve leaflet. There is mild calcification of the mitral valve leaflet(s). Mild mitral annular calcification. Mild mitral valve regurgitation. Tricuspid Valve: The tricuspid valve is normal in structure. Tricuspid valve regurgitation is mild. Aortic Valve: The aortic valve is tricuspid. There is mild calcification of the aortic valve. There is mild thickening of the aortic valve. There is mild aortic valve annular calcification. Aortic valve regurgitation is mild. Aortic regurgitation PHT measures 619 msec. Aortic valve sclerosis/calcification is present, without any evidence of aortic stenosis. Aortic valve peak gradient measures 6.7 mmHg. Pulmonic Valve: The pulmonic valve was normal in structure. Pulmonic valve regurgitation is trivial. Aorta: The aortic root is normal in size and structure. There is mild (Grade II) atheroma plaque involving the aortic root and ascending aorta. Venous: The inferior vena cava is dilated in size with greater than 50% respiratory variability, suggesting right atrial pressure of 8 mmHg. IAS/Shunts: The atrial septum is grossly normal.  LEFT VENTRICLE PLAX 2D LVIDd:         4.60 cm   Diastology LVIDs:         3.20 cm   LV e' lateral:   6.75 cm/s LV PW:         1.20 cm   LV E/e' lateral: 9.8 LV IVS:        1.30 cm LVOT diam:      2.20 cm LV SV:         77 LV SV Index:   34 LVOT Area:  3.80 cm  RIGHT VENTRICLE             IVC RV S prime:     16.00 cm/s  IVC diam: 1.90 cm TAPSE (M-mode): 2.2 cm LEFT ATRIUM             Index        RIGHT ATRIUM           Index LA diam:        4.10 cm 1.82 cm/m   RA Area:     16.70 cm LA Vol (A2C):   63.2 ml 28.08 ml/m  RA Volume:   33.00 ml  14.66 ml/m LA Vol (A4C):   53.6 ml 23.79 ml/m LA Biplane Vol: 54.8 ml 24.35 ml/m  AORTIC VALVE AV Area (Vmax): 2.79 cm AV Vmax:        129.00 cm/s AV Peak Grad:   6.7 mmHg LVOT Vmax:      94.80 cm/s LVOT Vmean:     60.300 cm/s LVOT VTI:       0.203 m AI PHT:         619 msec  AORTA Ao Root diam: 3.10 cm Ao Asc diam:  3.90 cm MITRAL VALVE                TRICUSPID VALVE MV Area (PHT): 3.37 cm     TR Peak grad:   9.9 mmHg MV Decel Time: 225 msec     TR Vmax:        157.00 cm/s MV E velocity: 65.90 cm/s MV A velocity: 113.00 cm/s  SHUNTS MV E/A ratio:  0.58         Systemic VTI:  0.20 m                             Systemic Diam: 2.20 cm Orpah Cobb MD Electronically signed by Orpah Cobb MD Signature Date/Time: 09/20/2023/5:30:21 PM    Final    CT CHEST WO CONTRAST Result Date: 09/20/2023 CLINICAL DATA:  88 year old male with history of shortness of breath and cough. EXAM: CT CHEST WITHOUT CONTRAST TECHNIQUE: Multidetector CT imaging of the chest was performed following the standard protocol without IV contrast. RADIATION DOSE REDUCTION: This exam was performed according to the departmental dose-optimization program which includes automated exposure control, adjustment of the mA and/or kV according to patient size and/or use of iterative reconstruction technique. COMPARISON:  Chest CT 09/08/2023. FINDINGS: Cardiovascular: Heart size is mildly enlarged. Small amount of pericardial fluid and/or thickening, unlikely to be of any hemodynamic significance at this time. No pericardial calcification. There is aortic atherosclerosis, as well as atherosclerosis of the  great vessels of the mediastinum and the coronary arteries, including calcified atherosclerotic plaque in the left main, left anterior descending, left circumflex and right coronary arteries. Mediastinum/Nodes: No pathologically enlarged mediastinal or hilar lymph nodes. Please note that accurate exclusion of hilar adenopathy is limited on noncontrast CT scans. Esophagus is unremarkable in appearance. No axillary lymphadenopathy. Lungs/Pleura: Some of the airspace consolidation noted in the left upper lobe on the prior study has resolved. However, there continues to be a aggressive appearing mass-like area in the medial aspect of the left upper lobe which has macrolobulated and spiculated margins measuring approximately 4.7 x 4.1 cm (axial image 78 of series 2) making broad contact with the pleura medially, highly concerning for primary bronchogenic neoplasm. Patchy areas of ground-glass attenuation and septal thickening persist in the periphery of  the left upper lobe, likely persistent postobstructive pneumonitis. 6 mm nodule associated with the minor fissure (axial image 73 of series 3), unchanged in retrospect compared to the prior examination. Trace right pleural effusion. No left pleural effusion. Diffuse bronchial wall thickening with moderate centrilobular and paraseptal emphysema. Upper Abdomen: Aortic atherosclerosis. Low-attenuation lesion in the medial aspect of the upper pole of the right kidney (axial image 151 of series 2), incompletely characterized on today's noncontrast CT examination, but statistically likely a cyst (no imaging follow-up recommended). Musculoskeletal: There are no aggressive appearing lytic or blastic lesions noted in the visualized portions of the skeleton. IMPRESSION: 1. Persistent aggressive appearing mass in the central aspect of the left upper lobe highly concerning for primary bronchogenic neoplasm. Surrounding postobstructive pneumonia has improved, with residual areas of  postobstructive pneumonitis in the left upper lobe noted on today's examination. 2. Trace right pleural effusion. 3. Aortic atherosclerosis, in addition to left main and three-vessel coronary artery disease. 4. Trace amount of pericardial fluid and/or thickening, unlikely to be of any hemodynamic significance at this time. 5. Diffuse bronchial wall thickening with moderate centrilobular and paraseptal emphysema; imaging findings suggestive of underlying COPD. Aortic Atherosclerosis (ICD10-I70.0) and Emphysema (ICD10-J43.9). Electronically Signed   By: Trudie Reed M.D.   On: 09/20/2023 08:02   DG CHEST PORT 1 VIEW Result Date: 09/18/2023 CLINICAL DATA:  Hypoxia. EXAM: PORTABLE CHEST 1 VIEW COMPARISON:  Chest radiograph 09/16/2023. FINDINGS: Stable cardiomediastinal silhouette. There is a moderate layering pleural effusion with associated atelectasis/airspace disease on the left, similar or slightly increased compared to prior exam. There is mild right basilar atelectasis, increased from prior exam. No pneumothorax. IMPRESSION: 1. Moderate layering pleural effusion with associated atelectasis/airspace disease on the left, similar or slightly increased compared to prior exam. 2. Mild right basilar atelectasis, increased from prior exam. Electronically Signed   By: Romona Curls M.D.   On: 09/18/2023 10:01   DG Chest Port 1 View Result Date: 09/16/2023 CLINICAL DATA:  Acute respiratory failure. EXAM: PORTABLE CHEST 1 VIEW COMPARISON:  Chest x-ray from yesterday. FINDINGS: Stable cardiomediastinal silhouette. Similar patchy consolidation and interstitial opacity in the left upper and lower lobes. Clear right lung. No pleural effusion or pneumothorax. No acute osseous abnormality. IMPRESSION: 1. Similar left upper and lower lobe pneumonia. Electronically Signed   By: Obie Dredge M.D.   On: 09/16/2023 09:40   DG Chest Port 1 View Result Date: 09/15/2023 CLINICAL DATA:  5626 Acute respiratory failure  (HCC) 5626 EXAM: PORTABLE CHEST 1 VIEW COMPARISON:  09/12/2023 FINDINGS: Patient is rotated. Stable heart size. Aortic atherosclerosis. Patchy airspace opacity within the left mid to lower lung field with slightly improving aeration. Right lung is clear. No pleural effusion or pneumothorax. IMPRESSION: Patchy airspace opacity within the left mid to lower lung field with slightly improving aeration. Electronically Signed   By: Duanne Guess D.O.   On: 09/15/2023 12:40     Past medical hx Past Medical History:  Diagnosis Date   CAD (coronary artery disease)    Two RCA stents remotely / 3rd RCA stent 2006   Colon polyps    s/p several Cscopes.   COPD (chronic obstructive pulmonary disease) (HCC)    on O2, nocturnal   Diabetes mellitus    dx aprox 2009   ED (erectile dysfunction)    has a vacumm device   Ejection fraction    Hyperlipidemia    dx in 90s   Hypertension    dx in the 90  Hypogonadism male    PVD (peripheral vascular disease) (HCC)    s/p stents at [facility] 2009, Dr Jacinto Halim   Shortness of breath    O2 Sat dropped to 82% walking on the treadmill, September, 2012     Social History   Tobacco Use   Smoking status: Former    Current packs/day: 0.00    Average packs/day: 2.0 packs/day for 30.0 years (60.0 ttl pk-yrs)    Types: Cigarettes    Start date: 06/18/1947    Quit date: 06/17/1977    Years since quitting: 46.3   Smokeless tobacco: Never   Tobacco comments:    2 ppd, quit 1978  Vaping Use   Vaping status: Never Used  Substance Use Topics   Alcohol use: Not Currently   Drug use: No    Mr.Thetford reports that he quit smoking about 46 years ago. His smoking use included cigarettes. He started smoking about 76 years ago. He has a 60 pack-year smoking history. He has never used smokeless tobacco. He reports that he does not currently use alcohol. He reports that he does not use drugs.  Tobacco Cessation: Counseling given: Not Answered Tobacco comments: 2 ppd, quit  1978 Former smoker 1978  Past surgical hx, Family hx, Social hx all reviewed.  Current Outpatient Medications on File Prior to Visit  Medication Sig   albuterol (VENTOLIN HFA) 108 (90 Base) MCG/ACT inhaler USE 1 TO 2 INHALATIONS EVERY 6 HOURS AS NEEDED FOR WHEEZING OR SHORTNESS OF BREATH   Blood Glucose Monitoring Suppl (FREESTYLE FREEDOM LITE) w/Device KIT Use Freestyle Freedom Lite meter to check blood sugar twice daily. DX:E11.65   clopidogrel (PLAVIX) 75 MG tablet Take 1 tablet (75 mg total) by mouth daily with breakfast.   Dulaglutide (TRULICITY) 3 MG/0.5ML SOAJ INJECT 3 MG UNDER THE SKIN ONCE A WEEK   empagliflozin (JARDIANCE) 25 MG TABS tablet Take 1 tablet (25 mg total) by mouth daily before breakfast. For diabetes E11.65 and E 11.29   furosemide (LASIX) 40 MG tablet Take 1 tablet (40 mg total) by mouth daily.   gabapentin (NEURONTIN) 100 MG capsule Take 200 mg by mouth daily.   glucose blood (FREESTYLE LITE) test strip USE AS INSTRUCTED   metFORMIN (GLUCOPHAGE) 500 MG tablet Take 1 tablet (500 mg total) by mouth 2 (two) times daily with a meal.   metoprolol (TOPROL-XL) 200 MG 24 hr tablet Take 100 mg by mouth daily.   Multiple Vitamin (MULTIVITAMIN WITH MINERALS) TABS tablet Take 1 tablet by mouth daily.   OXYGEN Inhale 2 L/min into the lungs at bedtime. PRN during day   sacubitril-valsartan (ENTRESTO) 24-26 MG Take 1 tablet by mouth 2 (two) times daily.   simvastatin (ZOCOR) 20 MG tablet Take 20 mg by mouth daily.   tamsulosin (FLOMAX) 0.4 MG CAPS capsule Take 1 capsule (0.4 mg total) by mouth daily after supper.   Tiotropium Bromide-Olodaterol (STIOLTO RESPIMAT) 2.5-2.5 MCG/ACT AERS Inhale 2 puffs into the lungs daily.   No current facility-administered medications on file prior to visit.     No Known Allergies  Review Of Systems:  Constitutional:   + intentional   weight loss, No night sweats,  Fevers, chills, fatigue, or  lassitude.  HEENT:   No headaches,  Difficulty  swallowing,  Tooth/dental problems, or  Sore throat,                No sneezing, itching, ear ache, nasal congestion, post nasal drip,   CV:  No chest pain,  Orthopnea, PND, swelling in lower extremities, anasarca, dizziness, palpitations, syncope.   GI  No heartburn, indigestion, abdominal pain, nausea, vomiting, diarrhea, change in bowel habits, loss of appetite, bloody stools.   Resp: No shortness of breath with exertion or at rest.  No excess mucus, no productive cough,  No non-productive cough,  No coughing up of blood.  No change in color of mucus.  No wheezing.  No chest wall deformity  Skin: no rash or lesions.  GU: no dysuria, change in color of urine, no urgency or frequency.  No flank pain, no hematuria   MS:  No joint pain or swelling.  No decreased range of motion.  No back pain.  Psych:  No change in mood or affect. No depression or anxiety.  No memory loss.   Vital Signs BP 132/64 (BP Location: Right Arm, Patient Position: Sitting, Cuff Size: Large)   Pulse 79   Temp 97.9 F (36.6 C) (Oral)   Ht 6\' 2"  (1.88 m)   Wt 212 lb 12.8 oz (96.5 kg)   SpO2 94%   BMI 27.32 kg/m    Physical Exam:  General- No distress,  A&Ox3 ENT: No sinus tenderness, TM clear, pale nasal mucosa, no oral exudate,no post nasal drip, no LAN Cardiac: S1, S2, regular rate and rhythm, no murmur Chest: No wheeze/ rales/ dullness; no accessory muscle use, no nasal flaring, no sternal retractions Abd.: Soft Non-tender Ext: No clubbing cyanosis, edema Neuro:  normal strength Skin: No rashes, warm and dry Psych: normal mood and behavior   Assessment/Plan  New diagnosis squamous cell carcinoma of the lung Post bronchoscopy with biopsies Former smoker Hemoptysis 09/23/2023 with ED visit and PCCM consult Plan The biopsy of your left upper lobe lung mass was positive for squamous cell lung cancer.  I will refer you to both radiation oncology and medical oncology for treatment.  You will get a  call to get both consults scheduled.  Both the medical oncology doctor and the radiation oncology doctor will go over the best options for treatment for you. I have ordered both a PET scan and a MR Brain to complete staging You will get calls to get these scheduled  They will take great care of you. Good luck with your treatment.  Follow up with [place] as needed.  Please contact office for sooner follow up if symptoms do not improve or worsen or seek emergency care    I spent 30 minutes dedicated to the care of this patient on the date of this encounter to include pre-visit review of records, face-to-face time with the patient discussing conditions above, post visit ordering of testing, clinical documentation with the electronic health record, making appropriate referrals as documented, and communicating necessary information to the patient's healthcare team.    Bevelyn Ngo, NP 10/14/2023  2:27 PM

## 2023-10-15 ENCOUNTER — Ambulatory Visit
Admission: RE | Admit: 2023-10-15 | Discharge: 2023-10-15 | Disposition: A | Discharge status: Home / Self Care | Payer: Medicare Other | Source: Ambulatory Visit | Attending: Radiation Oncology | Admitting: Radiation Oncology

## 2023-10-15 ENCOUNTER — Encounter: Payer: Self-pay | Admitting: Radiation Oncology

## 2023-10-15 ENCOUNTER — Ambulatory Visit: Payer: Medicare Other | Admitting: Acute Care

## 2023-10-15 VITALS — BP 121/69 | HR 80 | Resp 16 | Ht 74.0 in | Wt 212.8 lb

## 2023-10-15 DIAGNOSIS — Z8042 Family history of malignant neoplasm of prostate: Secondary | ICD-10-CM | POA: Insufficient documentation

## 2023-10-15 DIAGNOSIS — Z79899 Other long term (current) drug therapy: Secondary | ICD-10-CM | POA: Diagnosis not present

## 2023-10-15 DIAGNOSIS — Z87891 Personal history of nicotine dependence: Secondary | ICD-10-CM | POA: Insufficient documentation

## 2023-10-15 DIAGNOSIS — Z9981 Dependence on supplemental oxygen: Secondary | ICD-10-CM | POA: Diagnosis not present

## 2023-10-15 DIAGNOSIS — E1151 Type 2 diabetes mellitus with diabetic peripheral angiopathy without gangrene: Secondary | ICD-10-CM | POA: Insufficient documentation

## 2023-10-15 DIAGNOSIS — J189 Pneumonia, unspecified organism: Secondary | ICD-10-CM | POA: Diagnosis not present

## 2023-10-15 DIAGNOSIS — Z860100 Personal history of colon polyps, unspecified: Secondary | ICD-10-CM | POA: Insufficient documentation

## 2023-10-15 DIAGNOSIS — E1122 Type 2 diabetes mellitus with diabetic chronic kidney disease: Secondary | ICD-10-CM | POA: Diagnosis not present

## 2023-10-15 DIAGNOSIS — I509 Heart failure, unspecified: Secondary | ICD-10-CM | POA: Diagnosis not present

## 2023-10-15 DIAGNOSIS — Z7984 Long term (current) use of oral hypoglycemic drugs: Secondary | ICD-10-CM | POA: Diagnosis not present

## 2023-10-15 DIAGNOSIS — I2510 Atherosclerotic heart disease of native coronary artery without angina pectoris: Secondary | ICD-10-CM | POA: Insufficient documentation

## 2023-10-15 DIAGNOSIS — N189 Chronic kidney disease, unspecified: Secondary | ICD-10-CM | POA: Insufficient documentation

## 2023-10-15 DIAGNOSIS — Z7985 Long-term (current) use of injectable non-insulin antidiabetic drugs: Secondary | ICD-10-CM | POA: Diagnosis not present

## 2023-10-15 DIAGNOSIS — R042 Hemoptysis: Secondary | ICD-10-CM | POA: Diagnosis not present

## 2023-10-15 DIAGNOSIS — J432 Centrilobular emphysema: Secondary | ICD-10-CM | POA: Insufficient documentation

## 2023-10-15 DIAGNOSIS — E785 Hyperlipidemia, unspecified: Secondary | ICD-10-CM | POA: Insufficient documentation

## 2023-10-15 DIAGNOSIS — Z7902 Long term (current) use of antithrombotics/antiplatelets: Secondary | ICD-10-CM | POA: Diagnosis not present

## 2023-10-15 DIAGNOSIS — I130 Hypertensive heart and chronic kidney disease with heart failure and stage 1 through stage 4 chronic kidney disease, or unspecified chronic kidney disease: Secondary | ICD-10-CM | POA: Insufficient documentation

## 2023-10-15 DIAGNOSIS — I083 Combined rheumatic disorders of mitral, aortic and tricuspid valves: Secondary | ICD-10-CM | POA: Diagnosis not present

## 2023-10-15 DIAGNOSIS — C3412 Malignant neoplasm of upper lobe, left bronchus or lung: Secondary | ICD-10-CM | POA: Insufficient documentation

## 2023-10-15 NOTE — Progress Notes (Signed)
Thoracic Location of Tumor / Histology: Left Upper Lobe Lung   Patient presented to the ED in early January 2025 with chest discomfort and hemoptysis.    PET 10/25/2023:  MRI Brain 10/25/2023:   Biopsies of Left Upper Lobe Lung Mass 10/01/2023     Past/Anticipated interventions by cardiothoracic surgery, if any:   Past/Anticipated interventions by medical oncology, if any:    Tobacco/Marijuana/Snuff/ETOH use: Former Smoker, quit in 1978.  Signs/Symptoms Weight changes, if any: Stable Respiratory complaints, if any: No Hemoptysis, if any: Denies Pain issues, if any:  Denies chest pain, pressure, or tightness.  SAFETY ISSUES: Prior radiation? No Pacemaker/ICD? No Possible current pregnancy? N/a Is the patient on methotrexate?No   Current Complaints / other details:

## 2023-10-16 ENCOUNTER — Telehealth: Payer: Self-pay | Admitting: Internal Medicine

## 2023-10-16 ENCOUNTER — Other Ambulatory Visit: Payer: Self-pay | Admitting: *Deleted

## 2023-10-16 NOTE — Telephone Encounter (Signed)
Spoke with patient confirming upcoming appointment

## 2023-10-16 NOTE — Progress Notes (Signed)
Radiation Oncology         [phone]) [phone] ________________________________  Name: Jonathan [name].        MRN: [id number]  Date of Service: 10/15/2023 DOB: [birth year]  CC:Paz, Nolon Rod, MD  Josephine Igo, DO     REFERRING PHYSICIAN: Josephine Igo, DO   DIAGNOSIS: The encounter diagnosis was Malignant neoplasm of upper lobe of left lung (HCC).  ---------------------------------------------------------------------------------------------------------------------------------------- Addendum Please see the note from Laurence Aly, PA-C from today's visit for more details of today's encounter.  I have personally performed a face to face diagnostic evaluation on this patient and devised the following assessment and plan.  The patient underwent evaluation today for his recent diagnosis of non-small cell lung cancer involving the left upper lobe.  This represents a 4.7 cm tumor.  Further workup is pending with a PET scan and brain MRI scan.  He also was scheduled to see medical oncology.  We discussed with the patient that we do not have a firm treatment plan currently.  He is aware that the upcoming further staging studies will help [place] formulate a plan.  Radiation treatment certainly may be part of his eventual treatment and this could be a course for instance of ultra hypofractionation alone versus concurrent treatment pending further results.  The patient is aware that we will further discuss the specifics of the recommendation soon as we get these upcoming studies.  All of his questions were answered today.  We look forward to further helping to coordinate his care.  Staging- brain MRI and PET scan pending  Dorothy Puffer, MD ----------------------------------------------------------------------------------------------------------------------------------------   HISTORY OF PRESENT ILLNESS: Jonathan [name]. is a 88 y.o. male seen at the request of Dr. Tonia Brooms for new diagnosis of lung cancer.   He has a history of chronic kidney disease, chronic respiratory failure on 2 L of oxygen, congestive heart failure hypertension and hyperlipidemia, diabetes, and presented on 09/08/2023 to the emergency room complaining of chest pain and hemoptysis he was worked up and found to have a rounded masslike area of consolidation in the left upper lobe the fluid-filled esophagus and debris.  He did have on 09/19/2023 findings in the left upper lobe and while some of the areas of airspace consolidation had resolved, what was better visualized was a 4.7 cm mass that was macrolobulated with spiculated margins in the left upper lobe and broad contact with the pleura no evidence of adenopathy or obvious evidence of medicine was appreciated, on 114, and cytology from the left upper lobe biopsy was consistent with a squamous cell carcinoma consistent with a lung cancer.  He has appointments coming up for a PET scan and brain MRI and is seen today to establish care.  He has not been seen or get an appointment by medical oncology.   PREVIOUS RADIATION THERAPY: No   PAST MEDICAL HISTORY:  Past Medical History:  Diagnosis Date   CAD (coronary artery disease)    Two RCA stents remotely / 3rd RCA stent 2006   Colon polyps    s/p several Cscopes.   COPD (chronic obstructive pulmonary disease) (HCC)    on O2, nocturnal   Diabetes mellitus    dx aprox 2009   ED (erectile dysfunction)    has a vacumm device   Ejection fraction    Hyperlipidemia    dx in 90s   Hypertension    dx in the 40   Hypogonadism male    PVD (peripheral vascular disease) (HCC)  s/p stents at LE 2009, Dr Jacinto Halim   Shortness of breath    O2 Sat dropped to 82% walking on the treadmill, September, 2012       PAST SURGICAL HISTORY: Past Surgical History:  Procedure Laterality Date   APPENDECTOMY     BRONCHIAL BIOPSY  10/01/2023   Procedure: BRONCHIAL BIOPSIES;  Surgeon: Josephine Igo, DO;  Location: [facility];  Service: Pulmonary;;    BRONCHIAL BRUSHINGS  10/01/2023   Procedure: BRONCHIAL BRUSHINGS;  Surgeon: Josephine Igo, DO;  Location: [facility];  Service: Pulmonary;;   BRONCHIAL WASHINGS  10/01/2023   Procedure: BRONCHIAL WASHINGS;  Surgeon: Josephine Igo, DO;  Location: [facility];  Service: Pulmonary;;   CORONARY STENT INTERVENTION N/A 01/12/2022   Procedure: CORONARY STENT INTERVENTION;  Surgeon: Swazil[place], Peter M, MD;  Location: [facility];  Service: Cardiovascular;  Laterality: N/A;   HEMOSTASIS CONTROL  10/01/2023   Procedure: HEMOSTASIS CONTROL;  Surgeon: Josephine Igo, DO;  Location: [facility];  Service: Pulmonary;;   LEFT HEART CATH AND CORONARY ANGIOGRAPHY N/A 03/13/2023   Procedure: LEFT HEART CATH AND CORONARY ANGIOGRAPHY;  Surgeon: Swazil[place], Peter M, MD;  Location: [facility] INVASIVE CV LAB;  Service: Cardiovascular;  Laterality: N/A;   RIGHT/LEFT HEART CATH AND CORONARY ANGIOGRAPHY N/A 01/11/2022   Procedure: RIGHT/LEFT HEART CATH AND CORONARY ANGIOGRAPHY;  Surgeon: Lyn Records, MD;  Location: [facility];  Service: Cardiovascular;  Laterality: N/A;   TONSILLECTOMY     VIDEO BRONCHOSCOPY  10/01/2023   Procedure: VIDEO BRONCHOSCOPY WITHOUT FLUORO;  Surgeon: Josephine Igo, DO;  Location: [facility];  Service: Pulmonary;;     FAMILY HISTORY:  Family History  Problem Relation Age of Onset   Heart disease Father    Hypertension Father    Stroke Father    Diabetes Paternal Aunt    Diabetes Maternal Grandmother    Diabetes Other        GM, nephews, many family members   Hyperlipidemia Other        ?   Prostate cancer Brother    Colon cancer Neg Hx      SOCIAL HISTORY:  reports that he quit smoking about 46 years ago. His smoking use included cigarettes. He started smoking about 76 years ago. He has a 60 pack-year smoking history. He has never used smokeless tobacco. He reports that he does not currently use alcohol. He reports that he does not use drugs.   ALLERGIES:  Patient has no known allergies.   MEDICATIONS:  Current Outpatient Medications  Medication Sig Dispense Refill   albuterol (VENTOLIN HFA) 108 (90 Base) MCG/ACT inhaler USE 1 TO 2 INHALATIONS EVERY 6 HOURS AS NEEDED FOR WHEEZING OR SHORTNESS OF BREATH 34 g 3   Blood Glucose Monitoring Suppl (FREESTYLE FREEDOM LITE) w/Device KIT Use Freestyle Freedom Lite meter to check blood sugar twice daily. DX:E11.65 1 kit 0   clopidogrel (PLAVIX) 75 MG tablet Take 1 tablet (75 mg total) by mouth daily with breakfast. 90 tablet 1   Dulaglutide (TRULICITY) 3 MG/0.5ML SOAJ INJECT 3 MG UNDER THE SKIN ONCE A WEEK 6 mL 3   empagliflozin (JARDIANCE) 25 MG TABS tablet Take 1 tablet (25 mg total) by mouth daily before breakfast. For diabetes E11.65 and E 11.29 90 tablet 3   furosemide (LASIX) 40 MG tablet Take 1 tablet (40 mg total) by mouth daily. 90 tablet 1   gabapentin (NEURONTIN) 100 MG capsule Take 200 mg by mouth daily.  glucose blood (FREESTYLE LITE) test strip USE AS INSTRUCTED 100 strip 11   metFORMIN (GLUCOPHAGE) 500 MG tablet Take 1 tablet (500 mg total) by mouth 2 (two) times daily with a meal. 180 tablet 3   metoprolol (TOPROL-XL) 200 MG 24 hr tablet Take 100 mg by mouth daily.     Multiple Vitamin (MULTIVITAMIN WITH MINERALS) TABS tablet Take 1 tablet by mouth daily.     OXYGEN Inhale 2 L/min into the lungs at bedtime. PRN during day     sacubitril-valsartan (ENTRESTO) 24-26 MG Take 1 tablet by mouth 2 (two) times daily. 60 tablet 0   simvastatin (ZOCOR) 20 MG tablet Take 20 mg by mouth daily.     tamsulosin (FLOMAX) 0.4 MG CAPS capsule Take 1 capsule (0.4 mg total) by mouth daily after supper. 90 capsule 1   Tiotropium Bromide-Olodaterol (STIOLTO RESPIMAT) 2.5-2.5 MCG/ACT AERS Inhale 2 puffs into the lungs daily. 12 g 4   No current facility-administered medications for this encounter.     REVIEW OF SYSTEMS: On review of systems, the patient reports that he is doing rather well overall.  He  states that he has been using oxygen at home at nighttime or when he is sitting at rest.  He cares for his wife who has dementia they have a sitter who comes to care for her when he is not home.  He denies any unintended weight changes, symptoms of shortness of breath or cough. He is no longer experiencing hemoptysis.     PHYSICAL EXAM:  Wt Readings from Last 3 Encounters:  10/15/23 212 lb 12.8 oz (96.5 kg)  10/14/23 212 lb 12.8 oz (96.5 kg)  10/01/23 225 lb (102.1 kg)   Temp Readings from Last 3 Encounters:  10/14/23 97.9 F (36.6 C) (Oral)  10/01/23 97.7 F (36.5 C)  09/30/23 97.8 F (36.6 C) (Oral)   BP Readings from Last 3 Encounters:  10/15/23 121/69  10/14/23 132/64  10/01/23 (!) 102/58   Pulse Readings from Last 3 Encounters:  10/15/23 80  10/14/23 79  10/01/23 79   Pain Assessment Pain Score: 0-No pain/10  In general this is a well appearing African American male in no acute distress. He's alert and oriented x4 and appropriate throughout the examination. Cardiopulmonary assessment is negative for acute distress and he exhibits normal effort.     ECOG = 1  0 - Asymptomatic (Fully active, able to carry on all predisease activities without restriction)  1 - Symptomatic but completely ambulatory (Restricted in physically strenuous activity but ambulatory and able to carry out work of a light or sedentary nature. For example, light housework, office work)  2 - Symptomatic, <50% in bed during the day (Ambulatory and capable of all self care but unable to carry out any work activities. Up and about more than 50% of waking hours)  3 - Symptomatic, >50% in bed, but not bedbound (Capable of only limited self-care, confined to bed or chair 50% or more of waking hours)  4 - Bedbound (Completely disabled. Cannot carry on any self-care. Totally confined to bed or chair)  5 - Death   Santiago Glad MM, Creech RH, Tormey DC, et al. [phone]). "Toxicity and response criteria of the [facility]". Am. Evlyn Clines. Oncol. 5 (6): 649-55    LABORATORY DATA:  Lab Results  Component Value Date   WBC 14.8 (H) 09/27/2023   HGB 13.6 09/27/2023   HCT 40.6 09/27/2023   MCV 97.6 09/27/2023   PLT 297  09/27/2023   Lab Results  Component Value Date   NA 141 09/27/2023   K 4.0 09/27/2023   CL 104 09/27/2023   CO2 26 09/27/2023   Lab Results  Component Value Date   ALT 18 09/27/2023   AST 13 09/27/2023   ALKPHOS 91 09/08/2023   BILITOT 0.5 09/27/2023      RADIOGRAPHY: DG Chest Port 1 View Result Date: 10/01/2023 CLINICAL DATA:  Status post bronchoscopy. EXAM: PORTABLE CHEST 1 VIEW COMPARISON:  X-ray 09/18/2023.  CT 09/19/2023. FINDINGS: Underinflation. No definite pneumothorax or effusion. Decreasing left lung base opacity and effusion. Some residual patchy opacity left mid to lower lung. Stable cardiopericardial silhouette. Overlapping cardiac leads. IMPRESSION: No pneumothorax seen post bronchoscopy. Electronically Signed   By: Karen Kays M.D.   On: 10/01/2023 10:22   ECHOCARDIOGRAM COMPLETE BUBBLE STUDY Result Date: 09/20/2023    ECHOCARDIOGRAM REPORT   Patient Name:   Jonathan Temme. Date of Exam: 09/20/2023 Medical Rec #:  [id number]           Height:       74.0 in Accession #:    [id number]          Weight:       217.2 lb Date of Birth:  1936-06-26            BSA:          2.251 m Patient Age:    87 years            BP:           138/69 mmHg Patient Gender: M                   HR:           50 bpm. Exam Location:  Inpatient Procedure: 2D Echo, Cardiac Doppler, Color Doppler, Saline Contrast Bubble Study            and Intracardiac Opacification Agent Indications:    Acute Resp failure  History:        Patient has prior history of Echocardiogram examinations, most                 recent 02/21/2023. CHF, CAD, COPD; Risk Factors:Hypertension,                 Diabetes and Dyslipidemia.  Sonographer:    Lucendia Herrlich Referring Phys: [id number] PRAVEEN MANNAM  IMPRESSIONS  1. Left ventricular ejection fraction, by estimation, is 55 to 60%. The left ventricle has normal function. The left ventricle has no regional wall motion abnormalities. There is mild concentric left ventricular hypertrophy. Left ventricular diastolic parameters are consistent with Grade I diastolic dysfunction (impaired relaxation).  2. Right ventricular systolic function is normal. The right ventricular size is normal.  3. Left atrial size was moderately dilated.  4. Mild flail of anterior leaflet.  5. The mitral valve is degenerative. Mild mitral valve regurgitation.  6. The aortic valve is tricuspid. There is mild calcification of the aortic valve. There is mild thickening of the aortic valve. Aortic valve regurgitation is mild. Aortic valve sclerosis/calcification is present, without any evidence of aortic stenosis.  7. There is mild (Grade II) atheroma plaque involving the aortic root and ascending aorta.  8. The inferior vena cava is dilated in size with >50% respiratory variability, suggesting right atrial pressure of 8 mmHg. FINDINGS  Left Ventricle: Left ventricular ejection fraction, by estimation, is 55 to 60%. The left ventricle has normal function.  The left ventricle has no regional wall motion abnormalities. Definity contrast agent was given IV to delineate the left ventricular  endocardial borders. The left ventricular internal cavity size was normal in size. There is mild concentric left ventricular hypertrophy. Left ventricular diastolic parameters are consistent with Grade I diastolic dysfunction (impaired relaxation). Right Ventricle: The right ventricular size is normal. No increase in right ventricular wall thickness. Right ventricular systolic function is normal. Left Atrium: Left atrial size was moderately dilated. Right Atrium: Right atrial size was normal in size. Pericardium: There is no evidence of pericardial effusion. Mitral Valve: Can not exclude vegetation. The mitral  valve is degenerative in appearance. Mild flail of the middle segment of the anterior mitral valve leaflet. There is mild calcification of the mitral valve leaflet(s). Mild mitral annular calcification. Mild mitral valve regurgitation. Tricuspid Valve: The tricuspid valve is normal in structure. Tricuspid valve regurgitation is mild. Aortic Valve: The aortic valve is tricuspid. There is mild calcification of the aortic valve. There is mild thickening of the aortic valve. There is mild aortic valve annular calcification. Aortic valve regurgitation is mild. Aortic regurgitation PHT measures 619 msec. Aortic valve sclerosis/calcification is present, without any evidence of aortic stenosis. Aortic valve peak gradient measures 6.7 mmHg. Pulmonic Valve: The pulmonic valve was normal in structure. Pulmonic valve regurgitation is trivial. Aorta: The aortic root is normal in size and structure. There is mild (Grade II) atheroma plaque involving the aortic root and ascending aorta. Venous: The inferior vena cava is dilated in size with greater than 50% respiratory variability, suggesting right atrial pressure of 8 mmHg. IAS/Shunts: The atrial septum is grossly normal.  LEFT VENTRICLE PLAX 2D LVIDd:         4.60 cm   Diastology LVIDs:         3.20 cm   LV e' lateral:   6.75 cm/s LV PW:         1.20 cm   LV E/e' lateral: 9.8 LV IVS:        1.30 cm LVOT diam:     2.20 cm LV SV:         77 LV SV Index:   34 LVOT Area:     3.80 cm  RIGHT VENTRICLE             IVC RV S prime:     16.00 cm/s  IVC diam: 1.90 cm TAPSE (M-mode): 2.2 cm LEFT ATRIUM             Index        RIGHT ATRIUM           Index LA diam:        4.10 cm 1.82 cm/m   RA Area:     16.70 cm LA Vol (A2C):   63.2 ml 28.08 ml/m  RA Volume:   33.00 ml  14.66 ml/m LA Vol (A4C):   53.6 ml 23.79 ml/m LA Biplane Vol: 54.8 ml 24.35 ml/m  AORTIC VALVE AV Area (Vmax): 2.79 cm AV Vmax:        129.00 cm/s AV Peak Grad:   6.7 mmHg LVOT Vmax:      94.80 cm/s LVOT Vmean:      60.300 cm/s LVOT VTI:       0.203 m AI PHT:         619 msec  AORTA Ao Root diam: 3.10 cm Ao Asc diam:  3.90 cm MITRAL VALVE  TRICUSPID VALVE MV Area (PHT): 3.37 cm     TR Peak grad:   9.9 mmHg MV Decel Time: 225 msec     TR Vmax:        157.00 cm/s MV E velocity: 65.90 cm/s MV A velocity: 113.00 cm/s  SHUNTS MV E/A ratio:  0.58         Systemic VTI:  0.20 m                             Systemic Diam: 2.20 cm Orpah Cobb MD Electronically signed by Orpah Cobb MD Signature Date/Time: 09/20/2023/5:30:21 PM    Final    CT CHEST WO CONTRAST Result Date: 09/20/2023 CLINICAL DATA:  88 year old male with history of shortness of breath and cough. EXAM: CT CHEST WITHOUT CONTRAST TECHNIQUE: Multidetector CT imaging of the chest was performed following the standard protocol without IV contrast. RADIATION DOSE REDUCTION: This exam was performed according to the departmental dose-optimization program which includes automated exposure control, adjustment of the mA and/or kV according to patient size and/or use of iterative reconstruction technique. COMPARISON:  Chest CT 09/08/2023. FINDINGS: Cardiovascular: Heart size is mildly enlarged. Small amount of pericardial fluid and/or thickening, unlikely to be of any hemodynamic significance at this time. No pericardial calcification. There is aortic atherosclerosis, as well as atherosclerosis of the great vessels of the mediastinum and the coronary arteries, including calcified atherosclerotic plaque in the left main, left anterior descending, left circumflex and right coronary arteries. Mediastinum/Nodes: No pathologically enlarged mediastinal or hilar lymph nodes. Please note that accurate exclusion of hilar adenopathy is limited on noncontrast CT scans. Esophagus is unremarkable in appearance. No axillary lymphadenopathy. Lungs/Pleura: Some of the airspace consolidation noted in the left upper lobe on the prior study has resolved. However, there continues to be a  aggressive appearing mass-like area in the medial aspect of the left upper lobe which has macrolobulated and spiculated margins measuring approximately 4.7 x 4.1 cm (axial image 78 of series 2) making broad contact with the pleura medially, highly concerning for primary bronchogenic neoplasm. Patchy areas of ground-glass attenuation and septal thickening persist in the periphery of the left upper lobe, likely persistent postobstructive pneumonitis. 6 mm nodule associated with the minor fissure (axial image 73 of series 3), unchanged in retrospect compared to the prior examination. Trace right pleural effusion. No left pleural effusion. Diffuse bronchial wall thickening with moderate centrilobular and paraseptal emphysema. Upper Abdomen: Aortic atherosclerosis. Low-attenuation lesion in the medial aspect of the upper pole of the right kidney (axial image 151 of series 2), incompletely characterized on today's noncontrast CT examination, but statistically likely a cyst (no imaging follow-up recommended). Musculoskeletal: There are no aggressive appearing lytic or blastic lesions noted in the visualized portions of the skeleton. IMPRESSION: 1. Persistent aggressive appearing mass in the central aspect of the left upper lobe highly concerning for primary bronchogenic neoplasm. Surrounding postobstructive pneumonia has improved, with residual areas of postobstructive pneumonitis in the left upper lobe noted on today's examination. 2. Trace right pleural effusion. 3. Aortic atherosclerosis, in addition to left main and three-vessel coronary artery disease. 4. Trace amount of pericardial fluid and/or thickening, unlikely to be of any hemodynamic significance at this time. 5. Diffuse bronchial wall thickening with moderate centrilobular and paraseptal emphysema; imaging findings suggestive of underlying COPD. Aortic Atherosclerosis (ICD10-I70.0) and Emphysema (ICD10-J43.9). Electronically Signed   By: Trudie Reed M.D.    On: 09/20/2023 08:02   DG  CHEST PORT 1 VIEW Result Date: 09/18/2023 CLINICAL DATA:  Hypoxia. EXAM: PORTABLE CHEST 1 VIEW COMPARISON:  Chest radiograph 09/16/2023. FINDINGS: Stable cardiomediastinal silhouette. There is a moderate layering pleural effusion with associated atelectasis/airspace disease on the left, similar or slightly increased compared to prior exam. There is mild right basilar atelectasis, increased from prior exam. No pneumothorax. IMPRESSION: 1. Moderate layering pleural effusion with associated atelectasis/airspace disease on the left, similar or slightly increased compared to prior exam. 2. Mild right basilar atelectasis, increased from prior exam. Electronically Signed   By: Romona Curls M.D.   On: 09/18/2023 10:01       IMPRESSION/PLAN: 1. At least Stage IIA, [id number], NSCLC, squamous cell carcinoma of the LUL. Dr. Mitzi Hansen discusses the pathology findings and reviews the nature of lung cancer.  The patient still has additional imaging to complete before final treatment recommendations can be made for final staging.  If he does not have any further disease involving the lymphatics or other sites of metastatic disease, he could be a candidate for more local therapy.  It is uncertain if he would be a good surgical candidate based on his history of respiratory failure though seems clinically somewhat doubtful despite how physically good he looks in person.  We discussed the possibilities of a ultra hypofractionated course if he does not have other evidence of disease, but he is also aware that treatment could involve chemoradiation, or palliative therapy is different roles and approaches to radiation treatment.  He is in agreement and we will follow-up with him after he is had his additional tests.  I will also follow-up with the thoracic oncology navigator to see about his referral to Dr. Arbutus Ped.  In a visit lasting 60 minutes, greater than 50% of the time was spent face to face  discussing the patient's condition, in preparation for the discussion, and coordinating the patient's care.   The above documentation reflects my direct findings during this shared patient visit. Please see the separate note by Dr. Mitzi Hansen on this date for the remainder of the patient's plan of care.    Osker Mason, [facility]   **Disclaimer: This note was dictated with voice recognition software. Similar sounding words can inadvertently be transcribed and this note may contain transcription errors which may not have been corrected upon publication of note.**

## 2023-10-16 NOTE — Patient Outreach (Signed)
Care Management  Transitions of Care Program Transitions of Care Post-discharge week 4/ day # 22   10/16/2023 Name: Jonathan [name]. MRN: [id number] DOB: 18-Mar-1936  Subjective: Marquay Kruse. is a 88 y.o. year old male who is a primary care patient of Wanda Plump, MD. The Care Management team Engaged with patient Engaged with patient by telephone to assess and address transitions of care needs.   Consent to Services:  Patient was given information about care management services, agreed to services, and gave verbal consent to participate.  Enrolled in TOC 30-day program: 09/24/23  Assessment:   "I am doing just fine, not having any problems at all; went to see the new radiation cancer team yesterday and I have been scheduled for a MRI and PET scan; once I get those done, I will know more about what they think the plan should be.  No changes to medications, no new coughing up blood.  I'm doing great"    Patient denies specific clinical concerns today and sounds to be in no distress throughout [facility] 30-day program outreach call today          SDOH Interventions    Flowsheet Row Telephone from 09/24/2023 in [facility] Office Visit from 04/11/2022 in [facility] Clinical Support from 07/20/2021 in [facility] Care at [facility] Office Visit from 10/17/2020 in [facility] at [facility]  SDOH Interventions      Food Insecurity Interventions Intervention Not Indicated -- -- --  Housing Interventions Intervention Not Indicated -- -- --  Transportation Interventions Intervention Not Indicated  [Drives self] -- -- --  Utilities Interventions Intervention Not Indicated -- -- --  Depression Interventions/Treatment  -- PHQ2-9 Score <4 Follow-up Not Indicated -- PHQ2-9 Score <4 Follow-up Not Indicated  Physical Activity Interventions -- -- Intervention Not Indicated  [pt  states he stays active doing things around the house] --        Goals Addressed             This Visit's Progress    TOC 30-day Program Care Plan   On track    Current Barriers:  Medication access Full medication review completed 09/24/23: patient reported he is not/ has not been taking potassium; verified through extensive efforts this was prescribed at time of 02/22/23 hospital discharge- provided to patient by [facility] pharmacy- with no refills; sent care coordination outreach to cardiology provider through review of EHR, requesting clarification; noted patient's most recent postassium level is 4.5 (09/18/23) -- Resolved- cardiology provider clarified patient does notneed to continue taking K+ supplement/ patient aware Provider appointments Patient has been instructed to attend 10/01/23 brochoscopy with Dr. Tonia Brooms at the "[facility] Endoscopy;" No address, time, or phone number was included on printed AVS: patient does not know what or where/ what time he should go:  MULTIPLE care coordination outreaches completed with [facility]/ [facility] / pulmonary provider (Dr. Icard)/ eventual endoscopy department at [facility]; eventually and with much effort obtained information that patient is to go to the main hospital facility ([facility]) at 9:00 am on 10/01/23: confirmed that he will be contacted by staff closer to 10/01/23-- if patient has questions in the mean time- he is to call and ask for Shannon/ respiratory coordinator at the endoscopy department [phone]) and/ or the short stay department at [phone]) Resolved- patient provided update on this issue and attended endoscopy  as scheduled Home Health services Confirmed PT/OT home health services referral sent to [facility]: patient has not heard from [facility], and there is no information included for patient on his discharge instructions; care coordination outreach completed with [facility]: [phone]: spoke with [facility]; prior to signing of TOC note:  confirmed patient has scheduled PT initial home visit for tomorrow 09/25/23 - Resolved- confirmed home health services were initiated and are now completed as of 10/09/23 TOC 30-day program outreach Hospitalized 09/09/23- 09/22/22: hemoptysis with new diagnosis of LUL mass; multiple hospitalizations over last 6 months:  3 total with ED visits as well  Independent in self-care; resides with supportive spouse; no SDOH barriers identified- has local son that assists with care as indicated; patient reports his spouse requires care- he assists his spouse with care needs and has private duty care for her as well 10/16/23: New diagnosis of lung CA: confirmed patient has had introductory visit with radiation oncology providers on 10/15/23- he denies questions post- visit  RNCM Clinical Goal(s):  Patient will work with the Care Management team over the next 30 days to address Transition of Care Barriers: Medication access Medication Management Provider appointments Home Health services through collaboration with RN Care manager, provider, and care team.   Interventions: Evaluation of current treatment plan related to  self management and patient's adherence to plan as established by provider  Transitions of Care:  Goal on track:  Yes. 10/16/23 Doctor Visits  - discussed the importance of doctor visits Discussed current clinical condition: "I am doing just fine, not having any problems at all; went to see the new radiation cancer team yesterday and I have been scheduled for a MRI and PET scan; once I get those done, I will know more about what they think the plan should be.  No changes to medications, no new coughing up blood.  I'm doing great;"  Patient denies specific clinical concerns today and sounds to be in no distress throughout [facility] 30-day program outreach call today Reviewed recent provider office visits:  10/14/23- pulmonary provider: was given positive results for lung cancer; 10/15/23- radiation  oncology introductory visit: patient denies questions around both visits and verbalizes very good understanding of post- visit instructions Confirmed continues driving self; independent in self-care activities  Patient Goals/Self-Care Activities: Participate in Transition of Care Program/Attend TOC scheduled calls Take all medications as prescribed Attend all scheduled provider appointments Perform all self care activities independently  Call provider office for new concerns or questions  Continue using home oxygen as prescribed Continue following heart healthy/ low salt/ carbohydrate modified diet If you believe your condition is getting worse- contact your care providers (doctors) promptly- reaching out to your doctor early when you have concerns can prevent you from having to go to the hospital  Follow Up Plan:  Telephone follow up appointment with care management team member scheduled for:  Thursday 10/24/23 at 10:30 am          Plan: Telephone follow up appointment with care management team member scheduled for:   Thursday 10/24/23 at 10:30 am   Caryl Pina, RN, BSN, CCRN Alumnus RN Care Manager  Transitions of Care  [facility] - [facility] [phone]: direct office

## 2023-10-16 NOTE — Patient Instructions (Signed)
Visit Information  Thank you for taking time to visit with me today. Please don't hesitate to contact me if I can be of assistance to you before our next scheduled telephone appointment.  Our next appointment is by telephone on Thursday 10/23/22 at 10:30 am  Please call the care guide team at [phone] if you need to cancel or reschedule your appointment.   Following are the goals we discussed today:  Patient Goals/Self-Care Activities: Participate in Transition of Care Program/Attend TOC scheduled calls Take all medications as prescribed Attend all scheduled provider appointments Perform all self care activities independently  Call provider office for new concerns or questions  Continue using home oxygen as prescribed Continue following heart healthy/ low salt/ carbohydrate modified diet If you believe your condition is getting worse- contact your care providers (doctors) promptly- reaching out to your doctor early when you have concerns can prevent you from having to go to the hospital  If you are experiencing a Mental Health or Behavioral Health Crisis or need someone to talk to, please  call the Suicide and Crisis Lifeline: 988 call the [place] National Suicide Prevention Lifeline: (916)251-5770 or TTY: 820-481-9895 TTY (580)130-5314) to talk to a trained counselor call 1-800-273-TALK (toll free, 24 hour hotline) go to [facility] [address] [name] [phone]) call the [facility] Crisis Line: ([phone] call 911   Patient verbalizes understanding of instructions and care plan provided today and agrees to view in MyChart. Active MyChart status and patient understanding of how to access instructions and care plan via MyChart confirmed with patient.     Caryl Pina, RN, BSN, Media planner  Transitions of Care  [facility] - [facility] [phone]: direct office

## 2023-10-17 NOTE — Addendum Note (Signed)
Encounter addended by: Dorothy Puffer, MD on: 10/17/2023 1:19 PM  Actions taken: Clinical Note Signed

## 2023-10-19 ENCOUNTER — Encounter: Payer: Self-pay | Admitting: Acute Care

## 2023-10-19 DIAGNOSIS — I5042 Chronic combined systolic (congestive) and diastolic (congestive) heart failure: Secondary | ICD-10-CM | POA: Diagnosis not present

## 2023-10-19 DIAGNOSIS — I130 Hypertensive heart and chronic kidney disease with heart failure and stage 1 through stage 4 chronic kidney disease, or unspecified chronic kidney disease: Secondary | ICD-10-CM | POA: Diagnosis not present

## 2023-10-19 DIAGNOSIS — E1151 Type 2 diabetes mellitus with diabetic peripheral angiopathy without gangrene: Secondary | ICD-10-CM | POA: Diagnosis not present

## 2023-10-19 DIAGNOSIS — J440 Chronic obstructive pulmonary disease with acute lower respiratory infection: Secondary | ICD-10-CM | POA: Diagnosis not present

## 2023-10-19 DIAGNOSIS — J181 Lobar pneumonia, unspecified organism: Secondary | ICD-10-CM | POA: Diagnosis not present

## 2023-10-19 DIAGNOSIS — J9621 Acute and chronic respiratory failure with hypoxia: Secondary | ICD-10-CM | POA: Diagnosis not present

## 2023-10-22 ENCOUNTER — Telehealth: Payer: Self-pay

## 2023-10-22 DIAGNOSIS — C3492 Malignant neoplasm of unspecified part of left bronchus or lung: Secondary | ICD-10-CM | POA: Diagnosis not present

## 2023-10-22 DIAGNOSIS — I10 Essential (primary) hypertension: Secondary | ICD-10-CM | POA: Diagnosis not present

## 2023-10-22 DIAGNOSIS — E1100 Type 2 diabetes mellitus with hyperosmolarity without nonketotic hyperglycemic-hyperosmolar coma (NKHHC): Secondary | ICD-10-CM | POA: Diagnosis not present

## 2023-10-22 NOTE — Telephone Encounter (Signed)
Spoke w/ Jonathan [name]- verbal orders given.  

## 2023-10-22 NOTE — Telephone Encounter (Signed)
 Copied from CRM [id number]. Topic: Clinical - Home Health Verbal Orders >> Oct 22, 2023 11:17 AM Leila C wrote: Caller/Agency: Cecila from [facility] [phone] needs verbal orders to extended physical therapy once a week for 5 weeks beginning this week.

## 2023-10-23 ENCOUNTER — Other Ambulatory Visit: Payer: Medicare Other

## 2023-10-23 ENCOUNTER — Inpatient Hospital Stay: Payer: Medicare Other | Attending: Internal Medicine | Admitting: Internal Medicine

## 2023-10-23 VITALS — BP 137/73 | HR 66 | Temp 98.3°F | Resp 16 | Ht 74.0 in | Wt 215.4 lb

## 2023-10-23 DIAGNOSIS — C3412 Malignant neoplasm of upper lobe, left bronchus or lung: Secondary | ICD-10-CM | POA: Diagnosis not present

## 2023-10-23 DIAGNOSIS — C3490 Malignant neoplasm of unspecified part of unspecified bronchus or lung: Secondary | ICD-10-CM | POA: Insufficient documentation

## 2023-10-23 MED ORDER — ONDANSETRON HCL 8 MG PO TABS: 8.0000 mg | ORAL_TABLET | Freq: Three times a day (TID) | ORAL | 1 refills | Status: DC | PRN

## 2023-10-23 MED ORDER — PROCHLORPERAZINE MALEATE 10 MG PO TABS: 10.0000 mg | ORAL_TABLET | Freq: Four times a day (QID) | ORAL | 1 refills | Status: DC | PRN

## 2023-10-23 NOTE — Progress Notes (Signed)
 START ON PATHWAY REGIMEN - Non-Small Cell Lung     A cycle is every 7 days, concurrent with RT:     Paclitaxel      Carboplatin   **Always confirm dose/schedule in your pharmacy ordering system**  Patient Characteristics: Preoperative or Nonsurgical Candidate (Clinical Staging), Stage IIB (N2a only) or Stage III - Nonsurgical Candidate, PS = 0,1 Therapeutic Status: Preoperative or Nonsurgical Candidate (Clinical Staging) AJCC T Category: cT3 AJCC N Category: cN2a AJCC M Category: cM0 AJCC 9 Stage Grouping: IIIA Check here if patient was staged using an edition other than AJCC Staging 9th Edition: false ECOG Performance Status: 1 Intent of Therapy: Curative Intent, Discussed with Patient

## 2023-10-23 NOTE — Progress Notes (Signed)
 [facility] Telephone:([phone]) [phone]   [phone]  CONSULT NOTE  REFERRING PHYSICIAN: Dr. Adine Icard  REASON FOR CONSULTATION:  88 years old African-American male recently diagnosed with lung cancer  HPI Jonathan [name]. is a 88 y.o. male.   Discussed the use of AI scribe software for clinical note transcription with the patient, who gave verbal consent to proceed.  History of Present Illness   Jonathan [name]. is an 88 year old male with lung cancer who presents for initial consultation.  He was initially admitted to the hospital the day before Christmas in 2024 due to shortness of breath and hemoptysis. During this admission, he was diagnosed with pneumonia and treated with antibiotics. A CT angiogram of the chest performed on September 08, 2023, revealed a mass in the left upper lobe of the lung.  A follow-up CT scan on September 19, 2023, showed an aggressive-appearing mass in the central aspect of the left upper lobe and trace fluid around the right lung. Subsequently, a bronchoscopy and biopsy were performed on October 01, 2023, which confirmed the diagnosis of non-small cell lung cancer, specifically squamous cell carcinoma. He was discharged from the hospital on September 23, 2023, and the bronchoscopy was conducted as an outpatient procedure.  No current symptoms such as chest pain, shortness of breath, cough, hemoptysis, nausea, vomiting, diarrhea, headaches, or changes in vision. He is scheduled for a PET scan and MRI of the brain soon.  His past medical history includes diabetes and hypertension. He has a significant smoking history, having smoked for over 20 years before quitting in 1978. He also has a history of alcohol use but reduced his intake significantly after his father's death from alcohol-related issues.  Family history is notable for prostate cancer in two brothers, one of whom passed away at the age of 23 or 28. His father died from  alcohol-related issues, and his mother passed away from old age. He has no siblings with lung cancer.  Socially, he is a retired it sales professional. He is married with six children. He has a caregiver at home to assist with his wife, who has dementia, from 1 PM to 8 PM daily. He takes care of her in the mornings.      HPI  Past Medical History:  Diagnosis Date   CAD (coronary artery disease)    Two RCA stents remotely / 3rd RCA stent 2006   Colon polyps    s/p several Cscopes.   COPD (chronic obstructive pulmonary disease) (HCC)    on O2, nocturnal   Diabetes mellitus    dx aprox 2009   ED (erectile dysfunction)    has a vacumm device   Ejection fraction    Hyperlipidemia    dx in 90s   Hypertension    dx in the 7   Hypogonadism male    PVD (peripheral vascular disease) (HCC)    s/p stents at [facility] 2009, Dr Ladona   Shortness of breath    O2 Sat dropped to 82% walking on the treadmill, September, 2012    Past Surgical History:  Procedure Laterality Date   APPENDECTOMY     BRONCHIAL BIOPSY  10/01/2023   Procedure: BRONCHIAL BIOPSIES;  Surgeon: Brenna Adine CROME, DO;  Location: [facility] ENDOSCOPY;  Service: Pulmonary;;   BRONCHIAL BRUSHINGS  10/01/2023   Procedure: BRONCHIAL BRUSHINGS;  Surgeon: Brenna Adine CROME, DO;  Location: [facility] ENDOS[facility];  Service: Pulmonary;;   BRONCHIAL WASHINGS  10/01/2023   Procedure: BRONCHIAL WASHINGS;  Surgeon: Brenna Adine CROME, DO;  Location: [facility] END[facility];  Service: Pulmonary;;   CORONARY STENT INTERVENTION N/A 01/12/2022   Procedure: CORONARY STENT INTERVENTION;  Surgeon: Jordan, Peter M, MD;  Location: [facility] INVASIVE CV LAB;  Service: Cardiovascular;  Laterality: N/A;   HEMOSTASIS CONTROL  10/01/2023   Procedure: HEMOSTASIS CONTROL;  Surgeon: Brenna Adine CROME, DO;  Location: [facility] ENDOSCOPY;  Service: Pulmonary;;   LEFT HEART CATH AND CORONARY ANGIOGRAPHY N/A 03/13/2023   Procedure: LEFT HEART CATH AND CORONARY ANGIOGRAPHY;  Surgeon: Jordan, Peter M, MD;   Location: [facility] INVASIVE CV LAB;  Service: Cardiovascular;  Laterality: N/A;   RIGHT/LEFT HEART CATH AND CORONARY ANGIOGRAPHY N/A 01/11/2022   Procedure: RIGHT/LEFT HEART CATH AND CORONARY ANGIOGRAPHY;  Surgeon: Claudene Victory ORN, MD;  Location: [facility] INVASIVE CV LAB;  Service: Cardiovascular;  Laterality: N/A;   TONSILLECTOMY     VIDEO BRONCHOSCOPY  10/01/2023   Procedure: VIDEO BRONCHOSCOPY WITHOUT FLUORO;  Surgeon: Brenna Adine CROME, DO;  Location: [facility] ENDOSCOPY;  Service: Pulmonary;;    Family History  Problem Relation Age of Onset   Heart disease Father    Hypertension Father    Stroke Father    Diabetes Paternal Aunt    Diabetes Maternal Grandmother    Diabetes Other        GM, nephews, many family members   Hyperlipidemia Other        ?   Prostate cancer Brother    Colon cancer Neg Hx     Social History Social History   Tobacco Use   Smoking status: Former    Current packs/day: 0.00    Average packs/day: 2.0 packs/day for 30.0 years (60.0 ttl pk-yrs)    Types: Cigarettes    Start date: 06/18/1947    Quit date: 06/17/1977    Years since quitting: 46.3   Smokeless tobacco: Never   Tobacco comments:    2 ppd, quit 1978  Vaping Use   Vaping status: Never Used  Substance Use Topics   Alcohol use: Not Currently   Drug use: No    No Known Allergies  Current Outpatient Medications  Medication Sig Dispense Refill   albuterol  (VENTOLIN  HFA) 108 (90 Base) MCG/ACT inhaler USE 1 TO 2 INHALATIONS EVERY 6 HOURS AS NEEDED FOR WHEEZING OR SHORTNESS OF BREATH 34 g 3   Blood Glucose Monitoring Suppl (FREESTYLE FREEDOM LITE) w/Device KIT Use Freestyle Freedom Lite meter to check blood sugar twice daily. DX:E11.65 1 kit 0   clopidogrel  (PLAVIX ) 75 MG tablet Take 1 tablet (75 mg total) by mouth daily with breakfast. 90 tablet 1   Dulaglutide  (TRULICITY ) 3 MG/0.5ML SOAJ INJECT 3 MG UNDER THE SKIN ONCE A WEEK 6 mL 3   empagliflozin  (JARDIANCE ) 25 MG TABS tablet Take 1 tablet (25 mg total) by  mouth daily before breakfast. For diabetes E11.65 and E 11.29 90 tablet 3   furosemide  (LASIX ) 40 MG tablet Take 1 tablet (40 mg total) by mouth daily. 90 tablet 1   gabapentin  (NEURONTIN ) 100 MG capsule Take 200 mg by mouth daily.     glucose blood (FREESTYLE LITE) test strip USE AS INSTRUCTED 100 strip 11   metFORMIN  (GLUCOPHAGE ) 500 MG tablet Take 1 tablet (500 mg total) by mouth 2 (two) times daily with a meal. 180 tablet 3   metoprolol  (TOPROL -XL) 200 MG 24 hr tablet Take 100 mg by mouth daily.     Multiple Vitamin (MULTIVITAMIN WITH MINERALS) TABS tablet Take 1 tablet  by mouth daily.     OXYGEN  Inhale 2 L/min into the lungs at bedtime. PRN during day     sacubitril -valsartan  (ENTRESTO ) 24-26 MG Take 1 tablet by mouth 2 (two) times daily. 60 tablet 0   simvastatin  (ZOCOR ) 20 MG tablet Take 20 mg by mouth daily.     tamsulosin  (FLOMAX ) 0.4 MG CAPS capsule Take 1 capsule (0.4 mg total) by mouth daily after supper. 90 capsule 1   Tiotropium Bromide -Olodaterol (STIOLTO RESPIMAT ) 2.5-2.5 MCG/ACT AERS Inhale 2 puffs into the lungs daily. 12 g 4   No current facility-administered medications for this visit.    Review of Systems  Constitutional: positive for fatigue Eyes: negative Ears, nose, mouth, throat, and face: negative Respiratory: negative Cardiovascular: negative Gastrointestinal: negative Genitourinary:negative Integument/breast: negative Hematologic/lymphatic: negative Musculoskeletal:negative Neurological: negative Behavioral/Psych: negative Endocrine: negative Allergic/Immunologic: negative  Physical Exam  MJO:jozmu, healthy, no distress, well nourished, well developed, and anxious SKIN: skin color, texture, turgor are normal, no rashes or significant lesions HEAD: Normocephalic, No masses, lesions, tenderness or abnormalities EYES: normal, PERRLA, Conjunctiva are pink and non-injected EARS: External ears normal, Canals clear OROPHARYNX:no exudate, no erythema, and  lips, buccal mucosa, and tongue normal  NECK: supple, no adenopathy, no JVD LYMPH:  no palpable lymphadenopathy, no hepatosplenomegaly LUNGS: clear to auscultation , and palpation HEART: regular rate & rhythm, no murmurs, and no gallops ABDOMEN:abdomen soft, non-tender, normal bowel sounds, and no masses or organomegaly BACK: Back symmetric, no curvature., No CVA tenderness EXTREMITIES:no joint deformities, effusion, or inflammation, no edema  NEURO: alert & oriented x 3 with fluent speech, no focal motor/sensory deficits  PERFORMANCE STATUS: ECOG 1  LABORATORY DATA: Lab Results  Component Value Date   WBC 14.8 (H) 09/27/2023   HGB 13.6 09/27/2023   HCT 40.6 09/27/2023   MCV 97.6 09/27/2023   PLT 297 09/27/2023      Chemistry      Component Value Date/Time   NA 141 09/27/2023 1225   NA 144 03/05/2023 0955   K 4.0 09/27/2023 1225   CL 104 09/27/2023 1225   CO2 26 09/27/2023 1225   BUN 26 (H) 09/27/2023 1225   BUN 17 03/05/2023 0955   CREATININE 1.29 (H) 09/27/2023 1225      Component Value Date/Time   CALCIUM  9.5 09/27/2023 1225   ALKPHOS 91 09/08/2023 2214   AST 13 09/27/2023 1225   AST 17 01/02/2022 1412   ALT 18 09/27/2023 1225   ALT 18 01/02/2022 1412   BILITOT 0.5 09/27/2023 1225   BILITOT 0.6 01/02/2022 1412       RADIOGRAPHIC STUDIES: DG Chest Port 1 View Result Date: 10/01/2023 CLINICAL DATA:  Status post bronchoscopy. EXAM: PORTABLE CHEST 1 VIEW COMPARISON:  X-ray 09/18/2023.  CT 09/19/2023. FINDINGS: Underinflation. No definite pneumothorax or effusion. Decreasing left lung base opacity and effusion. Some residual patchy opacity left mid to lower lung. Stable cardiopericardial silhouette. Overlapping cardiac leads. IMPRESSION: No pneumothorax seen post bronchoscopy. Electronically Signed   By: Ranell Bring M.D.   On: 10/01/2023 10:22    ASSESSMENT: This is a very pleasant 88 years old African-American male recently diagnosed with at least stage IIIa (T3,  N2, M0) non-small cell lung cancer, squamous cell carcinoma presented with left upper lobe lung mass with suspicious mediastinal lymphadenopathy diagnosed in January 2025, pending additional staging workup   PLAN: I had a lengthy discussion with the patient today about his current disease stage, prognosis and treatment options.  I personally and independently reviewed the scan images  and discussed the result with the patient today. Non-Small Cell Lung Cancer (NSCLC) - Squamous Cell Carcinoma Diagnosed with NSCLC, squamous cell carcinoma, in the left upper lobe. Initial presentation included pneumonia and hemoptysis. Hospitalized for 16 days in December 2024. CT scan on September 19, 2023, showed an aggressive mass in the central left upper lobe with trace pleural effusion. Bronchoscopy and biopsy on October 01, 2023, confirmed squamous cell carcinoma. Currently asymptomatic. Likely stage 3A due to tumor size and location. Surgery is not an option. Recommended concurrent chemotherapy and radiation. Discussed weekly carboplatin  and paclitaxel , daily radiation for six and a half weeks, potential side effects (nausea, fatigue, alopecia, cytopenias), and possible immunotherapy with durvalumab  if good response. Offered port placement for chemotherapy. Emphasized scheduling treatments in the afternoon to accommodate caregiving responsibilities. - Schedule PET scan and MRI on Friday - Administer weekly carboplatin  and paclitaxel  starting the Monday after next - Administer daily radiation therapy Monday through Friday for six and a half weeks - Arrange follow-up scan post-chemoradiation - Consider durvalumab  if good response to chemoradiation - Coordinate with radiation oncologist for detailed radiation plan - Provide chemotherapy information, including potential side effects - Offer port placement for chemotherapy - Schedule chemotherapy class for education  Diabetes Mellitus Diabetes mellitus. No specific  discussion of current management or complications during this visit.  Hypertension Hypertension. No specific discussion of current management or complications during this visit.  Prostate Cancer (Family History) Family history of prostate cancer in two brothers. Relevant for overall cancer risk assessment.  General Health Maintenance No specific screenings or vaccinations discussed.  Follow-up - Schedule follow-up with medical oncologist at the start of treatment - Provide calendar for chemotherapy and radiation schedule - Coordinate with Megan, the lung coordinator, for ongoing support and information.   The patient was advised to call immediately if he has any other concerning symptoms in the interval. The patient voices understanding of current disease status and treatment options and is in agreement with the current care plan.  All questions were answered. The patient knows to call the clinic with any problems, questions or concerns. We can certainly see the patient much sooner if necessary.  Thank you so much for allowing me to participate in the care of Jonathan E Ream Jr.. I will continue to follow up the patient with you and assist in his care.  The total time spent in the appointment was 90 minutes.  Disclaimer: This note was dictated with voice recognition software. Similar sounding words can inadvertently be transcribed and may not be corrected upon review.   Jonathan [name] October 23, 2023, 2:36 PM

## 2023-10-24 ENCOUNTER — Other Ambulatory Visit: Payer: Self-pay | Admitting: *Deleted

## 2023-10-24 ENCOUNTER — Telehealth: Payer: Self-pay | Admitting: Internal Medicine

## 2023-10-24 ENCOUNTER — Encounter: Payer: Self-pay | Admitting: Internal Medicine

## 2023-10-24 ENCOUNTER — Other Ambulatory Visit: Payer: Self-pay

## 2023-10-24 NOTE — Patient Instructions (Signed)
 Visit Information  Thank you for taking time to visit with me today. Please don't hesitate to contact me if I can be of assistance to you before our next scheduled telephone appointment.  Jonathan [name], it has been a pleasure working with you over the last 30 days!  Great job managing your care after your hospital visit!  I am glad that you are doing well!  Please do not hesitate to contact me in the future if I can be of assistance to you!  Following are the goals we discussed today:  Patient Goals/Self-Care Activities: Take all medications as prescribed Attend all scheduled provider appointments Perform all self care activities independently  Call provider office for new concerns or questions  Continue using home oxygen  as prescribed Continue following heart healthy/ low salt/ carbohydrate modified diet If you believe your condition is getting worse- contact your care providers (doctors) promptly- reaching out to your doctor early when you have concerns can prevent you from having to go to the hospital Please fully engage with your oncology team: the doctor and nurse coordinator  If you are experiencing a Mental Health or Behavioral Health Crisis or need someone to talk to, please call the Suicide and Crisis Lifeline: 988 call the [place]  National Suicide Prevention Lifeline: 270 473 9090 or TTY: 913-522-0928 TTY (225)526-2987) to talk to a trained counselor call 1-800-273-TALK (toll free, 24 hour hotline) go to [facility] [address], [place] [phone]) call the [facility] Crisis Line: [phone] call 911   Patient verbalizes understanding of instructions and care plan provided today and agrees to view in MyChart. Active MyChart status and patient understanding of how to access instructions and care plan via MyChart confirmed with patient.     Santana Gosdin Mckinney Jjesus Dingley, RN, BSN, Media Planner  Transitions of Care  [facility] -  [facility] [phone]: direct office

## 2023-10-24 NOTE — Patient Outreach (Signed)
 Care Management  Transitions of Care Program Transitions of Care Post-discharge 5  day # 30   10/24/2023 Name: Jonathan [name]. MRN: [id number] DOB: 1935-11-13  Subjective: Herbie Lehrmann. is a 88 y.o. year old male who is a primary care patient of [name], MD. The Care Management team Engaged with patient Engaged with patient by telephone to assess and address transitions of care needs.   Consent to Services:  Patient was given information about care management services, agreed to services, and gave verbal consent to participate.  Enrolled TOC 30-day program 09/24/23;  case closure on 10/24/23  Assessment: I am doing just fine, driving now to the [place] for an appointment.  I had the visit with the cancer doctor yesterday, they have set me up for a PET scan and an MRI tomorrow.  I have a financial planner at the [facility], [place].... I don't think I need another nurse- she will be sufficient.  There have been no changes to medications.    Patient denies specific clinical concerns today and sounds to be in no distress throughout [facility] 30-day program outreach call today  TOC 30--day outreach completed; patient has successfully met/ accomplished his established goals for [facility] 30-day program without hospital re-admission; denies need for ongoing care management follow up; confirmed patient has my direct phone number- encouraged him to call if she has needs arise in the future           SDOH Interventions    Flowsheet Row Telephone from 09/24/2023 in [place] POPULATION HEALTH DEPARTMENT Office Visit from 04/11/2022 in [facility] at [facility] Clinical Support from 07/20/2021 in [facility] Primary Care at [facility] Office Visit from 10/17/2020 in [facility] Primary Care at [facility]  SDOH Interventions      Food Insecurity Interventions Intervention Not Indicated -- -- --  Housing Interventions Intervention Not  Indicated -- -- --  Transportation Interventions Intervention Not Indicated  [Drives self] -- -- --  Utilities Interventions Intervention Not Indicated -- -- --  Depression Interventions/Treatment  -- PHQ2-9 Score <4 Follow-up Not Indicated -- PHQ2-9 Score <4 Follow-up Not Indicated  Physical Activity Interventions -- -- Intervention Not Indicated  [pt states he stays active doing things around the house] --        Goals Addressed             This Visit's Progress    TOC 30-day Program Care Plan   On track    Current Barriers:  Medication access Full medication review completed 09/24/23: patient reported he is not/ has not been taking potassium; verified through extensive efforts this was prescribed at time of 02/22/23 hospital discharge- provided to patient by [facility] pharmacy- with no refills; sent care coordination outreach to cardiology provider through review of EHR, requesting clarification; noted patient's most recent postassium level is 4.5 (09/18/23) -- Resolved- cardiology provider clarified patient does notneed to continue taking K+ supplement/ patient aware Provider appointments Patient has been instructed to attend 10/01/23 brochoscopy with Dr. Brenna at the [facility]; No address, time, or phone number was included on printed AVS: patient does not know what or where/ what time he should go:  MULTIPLE care coordination outreaches completed with [facility]/  Endoscopy Center / pulmonary provider (Dr. Icard)/ eventual endoscopy department at [facility]; eventually and with much effort obtained information that patient is to go to the main hospital facility ([facility]) at 9:00 am  on 10/01/23: confirmed that he will be contacted by staff closer to 10/01/23-- if patient has questions in the mean time- he is to call and ask for Shannon/ respiratory coordinator at the endoscopy department [phone]) and/ or the short stay department at [phone]) Resolved- patient provided update on this  issue and attended endoscopy as scheduled Home Health services Confirmed PT/OT home health services referral sent to [facility]: patient has not heard from [facility], and there is no information included for patient on his discharge instructions; care coordination outreach completed with [facility]: [phone]: spoke with [facility]; prior to signing of TOC note: confirmed patient has scheduled PT initial home visit for tomorrow 09/25/23 - Resolved- confirmed home health services were initiated and are now completed as of 10/09/23 TOC 30-day program outreach Hospitalized 09/09/23- 09/22/22: hemoptysis with new diagnosis of LUL mass; multiple hospitalizations over last 6 months:  3 total with ED visits as well  Independent in self-care; resides with supportive spouse; no SDOH barriers identified- has local son that assists with care as indicated; patient reports his spouse requires care- he assists his spouse with care needs and has private duty care for her as well 10/16/23: New diagnosis of lung CA: confirmed patient has had introductory visit with radiation oncology providers on 10/15/23- he denies questions post- visit  RNCM Clinical Goal(s):  Patient will work with the Care Management team over the next 30 days to address Transition of Care Barriers: Medication access Medication Management Provider appointments Home Health services through collaboration with RN Care manager, provider, and care team.   Interventions: Evaluation of current treatment plan related to  self management and patient's adherence to plan as established by provider  Transitions of Care:  Goal Met. 10/24/23 Doctor Visits  - discussed the importance of doctor visits Discussed current clinical condition: I am doing just fine, driving now to the [place] for an appointment.  I had the visit with the cancer doctor yesterday, they have set me up for a PET scan and an MRI tomorrow.  I have a financial planner at the [facility], [place].... I  don't think I need another nurse- she will be sufficient.  There have been no changes to medications.  Patient denies specific clinical concerns today and sounds to be in no distress throughout [facility] 30-day program outreach call today Reviewed recent provider office visits:  10/23/23- oncology provider: verbalizes good understanding of same; denies questions/ concerns post-recent provider office visits Confirmed patient has been introduced with lung CA RN Care Coordinator on 10/23/23 during oncology provider initial office visit: he declines need for additional support/ visits with [facility] RN CM; encouraged his engagement with oncology team  Confirmed no changes to plan of care at home Reviewed upcoming radiology visits: PET/ MRI 10/25/23--- confirmed patient is aware of all and has plans to attend as scheduled Confirmed continues driving self; independent in self-care activities  Patient Goals/Self-Care Activities: Take all medications as prescribed Attend all scheduled provider appointments Perform all self care activities independently  Call provider office for new concerns or questions  Continue using home oxygen  as prescribed Continue following heart healthy/ low salt/ carbohydrate modified diet If you believe your condition is getting worse- contact your care providers (doctors) promptly- reaching out to your doctor early when you have concerns can prevent you from having to go to the hospital Please fully engage with your oncology team: the doctor and nurse coordinator  Follow Up Plan:  No further follow up required:    TOC 30--day outreach  completed; patient has successfully met/ accomplished her established goals for [facility] 30-day program without hospital re-admission; denies need for ongoing care management follow up; confirmed patient has my direct phone number- encouraged her to call if she has needs arise in the future         Plan:    TOC 30--day outreach completed; patient has  successfully met/ accomplished his established goals for [facility] 30-day program without hospital re-admission; denies need for ongoing care management follow up; confirmed patient has my direct phone number- encouraged him to call if she has needs arise in the future  Total time spent from review to signing of note/ including any care coordination interventions: 38 minutes  Wister Hoefle Mckinney Jaymar Loeber, RN, BSN, Media Planner  Transitions of Care  [facility] - Population Health  [place] [phone]: direct office

## 2023-10-24 NOTE — Progress Notes (Signed)
 I met the pt face to face today at his consult with Dr Sherrod. Pt was unaccompanied at this appt. Dr Blas recommendation for treatment was concurrent chemoradiation. Pt has a difficult social situation. He is the primary caregiver for his wife who has dementia. He has help that comes during the day, but not until 1pm. Pt was adamant that all his appointments be in the afternoon. I told the pt we will do our best to accommodate his requests. Dr Sherrod recommended a port for the pt and I will arrange this for him. Pt is scheduled to have a brain MRI and PET scan on 2/7 to complete his staging workup. I provided my card to the pt along with the Lung Cancer Journey book.

## 2023-10-25 ENCOUNTER — Encounter (HOSPITAL_COMMUNITY)
Admission: RE | Admit: 2023-10-25 | Discharge: 2023-10-25 | Disposition: A | Discharge status: Home / Self Care | Payer: Medicare Other | Source: Ambulatory Visit | Attending: Acute Care | Admitting: Acute Care

## 2023-10-25 ENCOUNTER — Ambulatory Visit (HOSPITAL_COMMUNITY)
Admission: RE | Admit: 2023-10-25 | Discharge: 2023-10-25 | Disposition: A | Discharge status: Home / Self Care | Payer: Medicare Other | Source: Ambulatory Visit | Attending: Acute Care | Admitting: Acute Care

## 2023-10-25 ENCOUNTER — Other Ambulatory Visit: Payer: Medicare Other

## 2023-10-25 DIAGNOSIS — Z87891 Personal history of nicotine dependence: Secondary | ICD-10-CM | POA: Diagnosis not present

## 2023-10-25 DIAGNOSIS — Z7984 Long term (current) use of oral hypoglycemic drugs: Secondary | ICD-10-CM | POA: Diagnosis not present

## 2023-10-25 DIAGNOSIS — Z7902 Long term (current) use of antithrombotics/antiplatelets: Secondary | ICD-10-CM | POA: Diagnosis not present

## 2023-10-25 DIAGNOSIS — C3490 Malignant neoplasm of unspecified part of unspecified bronchus or lung: Secondary | ICD-10-CM | POA: Diagnosis not present

## 2023-10-25 DIAGNOSIS — E785 Hyperlipidemia, unspecified: Secondary | ICD-10-CM | POA: Diagnosis not present

## 2023-10-25 DIAGNOSIS — J440 Chronic obstructive pulmonary disease with acute lower respiratory infection: Secondary | ICD-10-CM | POA: Diagnosis not present

## 2023-10-25 DIAGNOSIS — J439 Emphysema, unspecified: Secondary | ICD-10-CM | POA: Diagnosis not present

## 2023-10-25 DIAGNOSIS — I700 Atherosclerosis of aorta: Secondary | ICD-10-CM | POA: Diagnosis not present

## 2023-10-25 DIAGNOSIS — R590 Localized enlarged lymph nodes: Secondary | ICD-10-CM | POA: Diagnosis not present

## 2023-10-25 DIAGNOSIS — Z95820 Peripheral vascular angioplasty status with implants and grafts: Secondary | ICD-10-CM | POA: Diagnosis not present

## 2023-10-25 DIAGNOSIS — C3412 Malignant neoplasm of upper lobe, left bronchus or lung: Secondary | ICD-10-CM | POA: Insufficient documentation

## 2023-10-25 DIAGNOSIS — N1832 Chronic kidney disease, stage 3b: Secondary | ICD-10-CM | POA: Diagnosis not present

## 2023-10-25 DIAGNOSIS — J181 Lobar pneumonia, unspecified organism: Secondary | ICD-10-CM | POA: Diagnosis not present

## 2023-10-25 DIAGNOSIS — J9621 Acute and chronic respiratory failure with hypoxia: Secondary | ICD-10-CM | POA: Diagnosis not present

## 2023-10-25 DIAGNOSIS — I5042 Chronic combined systolic (congestive) and diastolic (congestive) heart failure: Secondary | ICD-10-CM | POA: Diagnosis not present

## 2023-10-25 DIAGNOSIS — E278 Other specified disorders of adrenal gland: Secondary | ICD-10-CM | POA: Diagnosis not present

## 2023-10-25 DIAGNOSIS — R918 Other nonspecific abnormal finding of lung field: Secondary | ICD-10-CM | POA: Diagnosis not present

## 2023-10-25 DIAGNOSIS — Z8673 Personal history of transient ischemic attack (TIA), and cerebral infarction without residual deficits: Secondary | ICD-10-CM | POA: Diagnosis not present

## 2023-10-25 DIAGNOSIS — I083 Combined rheumatic disorders of mitral, aortic and tricuspid valves: Secondary | ICD-10-CM | POA: Diagnosis not present

## 2023-10-25 DIAGNOSIS — I2510 Atherosclerotic heart disease of native coronary artery without angina pectoris: Secondary | ICD-10-CM | POA: Diagnosis not present

## 2023-10-25 DIAGNOSIS — Z7985 Long-term (current) use of injectable non-insulin antidiabetic drugs: Secondary | ICD-10-CM | POA: Diagnosis not present

## 2023-10-25 DIAGNOSIS — E1122 Type 2 diabetes mellitus with diabetic chronic kidney disease: Secondary | ICD-10-CM | POA: Diagnosis not present

## 2023-10-25 DIAGNOSIS — I130 Hypertensive heart and chronic kidney disease with heart failure and stage 1 through stage 4 chronic kidney disease, or unspecified chronic kidney disease: Secondary | ICD-10-CM | POA: Diagnosis not present

## 2023-10-25 DIAGNOSIS — Z604 Social exclusion and rejection: Secondary | ICD-10-CM | POA: Diagnosis not present

## 2023-10-25 DIAGNOSIS — Z7982 Long term (current) use of aspirin: Secondary | ICD-10-CM | POA: Diagnosis not present

## 2023-10-25 DIAGNOSIS — E1151 Type 2 diabetes mellitus with diabetic peripheral angiopathy without gangrene: Secondary | ICD-10-CM | POA: Diagnosis not present

## 2023-10-25 LAB — GLUCOSE, CAPILLARY: Glucose-Capillary: 95 mg/dL (ref 70–99)

## 2023-10-25 MED ORDER — GADOBUTROL 1 MMOL/ML IV SOLN
9.0000 mL | Freq: Once | INTRAVENOUS | Status: AC | PRN
  Administered 2023-10-25: 9 mL via INTRAVENOUS

## 2023-10-25 MED ORDER — FLUDEOXYGLUCOSE F - 18 (FDG) INJECTION: 10.7500 | Freq: Once | INTRAVENOUS | Status: DC

## 2023-10-30 ENCOUNTER — Ambulatory Visit: Payer: Medicare Other

## 2023-10-30 ENCOUNTER — Other Ambulatory Visit: Payer: Self-pay | Admitting: Physician Assistant

## 2023-10-30 ENCOUNTER — Ambulatory Visit: Payer: Medicare Other | Admitting: Radiation Oncology

## 2023-10-30 DIAGNOSIS — C3412 Malignant neoplasm of upper lobe, left bronchus or lung: Secondary | ICD-10-CM

## 2023-10-31 NOTE — Progress Notes (Signed)
Pharmacist Chemotherapy Monitoring - Initial Assessment    Anticipated start date: 11/07/23   The following has been reviewed per standard work regarding the patient's treatment regimen: The patient's diagnosis, treatment plan and drug doses, and organ/hematologic function Lab orders and baseline tests specific to treatment regimen  The treatment plan start date, drug sequencing, and pre-medications Prior authorization status  Patient's documented medication list, including drug-drug interaction screen and prescriptions for anti-emetics and supportive care specific to the treatment regimen The drug concentrations, fluid compatibility, administration routes, and timing of the medications to be used The patient's access for treatment and lifetime cumulative dose history, if applicable  The patient's medication allergies and previous infusion related reactions, if applicable   Changes made to treatment plan:  N/A  Follow up needed:  N/A   Ebony Hail, Pharm.D., CPP 10/31/2023@10 :49 AM

## 2023-11-01 ENCOUNTER — Ambulatory Visit: Payer: Medicare Other | Admitting: Endocrinology

## 2023-11-01 ENCOUNTER — Inpatient Hospital Stay: Payer: Medicare Other

## 2023-11-01 NOTE — Progress Notes (Signed)
Pt had left me a VM early Thursday morning requesting we review his upcoming appts. I reiterated to the pt that he is not required to take the chemo with his radiation if he wishes, however it is still recommended. I let him know today is his chemo education at 2pm here at the cancer center, and his radiation CT sim is on this Monday 2/17 at 2pm. I let him know we are still coordinating with the schedulers for infusion and providers to get his chemo and doctors appts scheduled for the afternoons. Pt appreciates information provided. No additional questions at this time.  Email sent to [employer], Present[employer], broadcasting, to assist pt on the days he will need rides to and from his chemotherapy appts. Pt is aware I have reached out to our coordinator and I let him know christian should be reaching out to him soon.

## 2023-11-04 ENCOUNTER — Ambulatory Visit
Admission: RE | Admit: 2023-11-04 | Discharge: 2023-11-04 | Disposition: A | Discharge status: Home / Self Care | Payer: Medicare Other | Source: Ambulatory Visit | Attending: Radiation Oncology | Admitting: Radiation Oncology

## 2023-11-04 DIAGNOSIS — C3412 Malignant neoplasm of upper lobe, left bronchus or lung: Secondary | ICD-10-CM | POA: Diagnosis not present

## 2023-11-04 DIAGNOSIS — Z510 Encounter for antineoplastic radiation therapy: Secondary | ICD-10-CM | POA: Diagnosis not present

## 2023-11-04 DIAGNOSIS — Z87891 Personal history of nicotine dependence: Secondary | ICD-10-CM | POA: Diagnosis not present

## 2023-11-05 ENCOUNTER — Other Ambulatory Visit: Payer: Medicare Other

## 2023-11-05 NOTE — Progress Notes (Signed)
Pharmacist Chemotherapy Monitoring - Initial Assessment    Anticipated start date: 11/12/23   The following has been reviewed per standard work regarding the patient's treatment regimen: The patient's diagnosis, treatment plan and drug doses, and organ/hematologic function Lab orders and baseline tests specific to treatment regimen  The treatment plan start date, drug sequencing, and pre-medications Prior authorization status  Patient's documented medication list, including drug-drug interaction screen and prescriptions for anti-emetics and supportive care specific to the treatment regimen The drug concentrations, fluid compatibility, administration routes, and timing of the medications to be used The patient's access for treatment and lifetime cumulative dose history, if applicable  The patient's medication allergies and previous infusion related reactions, if applicable   Changes made to treatment plan:  N/A  Follow up needed:  N/A   Jonathan [name], RPH, 11/05/2023  11:03 AM

## 2023-11-07 ENCOUNTER — Inpatient Hospital Stay: Payer: Medicare Other

## 2023-11-07 ENCOUNTER — Inpatient Hospital Stay: Payer: Medicare Other | Admitting: Physician Assistant

## 2023-11-07 DIAGNOSIS — J181 Lobar pneumonia, unspecified organism: Secondary | ICD-10-CM | POA: Diagnosis not present

## 2023-11-07 DIAGNOSIS — C3412 Malignant neoplasm of upper lobe, left bronchus or lung: Secondary | ICD-10-CM | POA: Diagnosis not present

## 2023-11-07 DIAGNOSIS — I5042 Chronic combined systolic (congestive) and diastolic (congestive) heart failure: Secondary | ICD-10-CM | POA: Diagnosis not present

## 2023-11-07 DIAGNOSIS — J440 Chronic obstructive pulmonary disease with acute lower respiratory infection: Secondary | ICD-10-CM | POA: Diagnosis not present

## 2023-11-07 DIAGNOSIS — J9621 Acute and chronic respiratory failure with hypoxia: Secondary | ICD-10-CM | POA: Diagnosis not present

## 2023-11-07 DIAGNOSIS — E1151 Type 2 diabetes mellitus with diabetic peripheral angiopathy without gangrene: Secondary | ICD-10-CM | POA: Diagnosis not present

## 2023-11-07 DIAGNOSIS — Z510 Encounter for antineoplastic radiation therapy: Secondary | ICD-10-CM | POA: Diagnosis not present

## 2023-11-07 DIAGNOSIS — I130 Hypertensive heart and chronic kidney disease with heart failure and stage 1 through stage 4 chronic kidney disease, or unspecified chronic kidney disease: Secondary | ICD-10-CM | POA: Diagnosis not present

## 2023-11-07 DIAGNOSIS — Z87891 Personal history of nicotine dependence: Secondary | ICD-10-CM | POA: Diagnosis not present

## 2023-11-08 ENCOUNTER — Encounter: Payer: Self-pay | Admitting: Internal Medicine

## 2023-11-08 NOTE — Progress Notes (Signed)
Pt returned my call at this time. I asked if he had any questions regarding his upcoming schedule. Pt states he was able to get help from the [place] for someone to stay with his wife from 10a-1p M-F so he is able to make his appts earlier in the day. Pt states he would like to get an updated calendar today since he needs to go to a doctors appt with his wife. I printed a calendar that covers Feb/Mar/Apr and left it with Deanna Artis at front desk and let the pt know it will be there for him to pick up.  No other questions at the conclusion of our conversation. The pt knows he can reach out to me if needed.

## 2023-11-11 ENCOUNTER — Emergency Department (HOSPITAL_COMMUNITY): Payer: Medicare Other

## 2023-11-11 ENCOUNTER — Observation Stay (HOSPITAL_COMMUNITY)
Admission: EM | Admit: 2023-11-11 | Discharge: 2023-11-12 | Disposition: A | Discharge status: Home / Self Care | Payer: Medicare Other | Attending: Family Medicine | Admitting: Family Medicine

## 2023-11-11 ENCOUNTER — Ambulatory Visit: Payer: Medicare Other

## 2023-11-11 ENCOUNTER — Other Ambulatory Visit: Payer: Self-pay

## 2023-11-11 ENCOUNTER — Other Ambulatory Visit: Payer: Medicare Other

## 2023-11-11 ENCOUNTER — Ambulatory Visit: Payer: Medicare Other | Admitting: Physician Assistant

## 2023-11-11 ENCOUNTER — Ambulatory Visit: Payer: Medicare Other | Admitting: Radiation Oncology

## 2023-11-11 DIAGNOSIS — Z955 Presence of coronary angioplasty implant and graft: Secondary | ICD-10-CM | POA: Diagnosis not present

## 2023-11-11 DIAGNOSIS — I700 Atherosclerosis of aorta: Secondary | ICD-10-CM | POA: Diagnosis not present

## 2023-11-11 DIAGNOSIS — J188 Other pneumonia, unspecified organism: Secondary | ICD-10-CM | POA: Diagnosis not present

## 2023-11-11 DIAGNOSIS — J449 Chronic obstructive pulmonary disease, unspecified: Secondary | ICD-10-CM | POA: Diagnosis not present

## 2023-11-11 DIAGNOSIS — R7989 Other specified abnormal findings of blood chemistry: Secondary | ICD-10-CM | POA: Diagnosis not present

## 2023-11-11 DIAGNOSIS — I5042 Chronic combined systolic (congestive) and diastolic (congestive) heart failure: Secondary | ICD-10-CM | POA: Insufficient documentation

## 2023-11-11 DIAGNOSIS — R918 Other nonspecific abnormal finding of lung field: Secondary | ICD-10-CM | POA: Insufficient documentation

## 2023-11-11 DIAGNOSIS — N400 Enlarged prostate without lower urinary tract symptoms: Secondary | ICD-10-CM | POA: Insufficient documentation

## 2023-11-11 DIAGNOSIS — N1832 Chronic kidney disease, stage 3b: Secondary | ICD-10-CM | POA: Diagnosis not present

## 2023-11-11 DIAGNOSIS — I130 Hypertensive heart and chronic kidney disease with heart failure and stage 1 through stage 4 chronic kidney disease, or unspecified chronic kidney disease: Secondary | ICD-10-CM | POA: Diagnosis not present

## 2023-11-11 DIAGNOSIS — R531 Weakness: Secondary | ICD-10-CM | POA: Diagnosis not present

## 2023-11-11 DIAGNOSIS — I739 Peripheral vascular disease, unspecified: Secondary | ICD-10-CM | POA: Insufficient documentation

## 2023-11-11 DIAGNOSIS — R55 Syncope and collapse: Secondary | ICD-10-CM | POA: Diagnosis not present

## 2023-11-11 DIAGNOSIS — Z87891 Personal history of nicotine dependence: Secondary | ICD-10-CM | POA: Diagnosis not present

## 2023-11-11 DIAGNOSIS — C3490 Malignant neoplasm of unspecified part of unspecified bronchus or lung: Secondary | ICD-10-CM | POA: Diagnosis not present

## 2023-11-11 DIAGNOSIS — Z7985 Long-term (current) use of injectable non-insulin antidiabetic drugs: Secondary | ICD-10-CM | POA: Insufficient documentation

## 2023-11-11 DIAGNOSIS — I2510 Atherosclerotic heart disease of native coronary artery without angina pectoris: Secondary | ICD-10-CM | POA: Insufficient documentation

## 2023-11-11 DIAGNOSIS — I959 Hypotension, unspecified: Secondary | ICD-10-CM | POA: Diagnosis not present

## 2023-11-11 DIAGNOSIS — Z1152 Encounter for screening for COVID-19: Secondary | ICD-10-CM | POA: Insufficient documentation

## 2023-11-11 DIAGNOSIS — J189 Pneumonia, unspecified organism: Secondary | ICD-10-CM | POA: Diagnosis not present

## 2023-11-11 DIAGNOSIS — Z7902 Long term (current) use of antithrombotics/antiplatelets: Secondary | ICD-10-CM | POA: Diagnosis not present

## 2023-11-11 DIAGNOSIS — R42 Dizziness and giddiness: Secondary | ICD-10-CM | POA: Diagnosis not present

## 2023-11-11 DIAGNOSIS — C7800 Secondary malignant neoplasm of unspecified lung: Secondary | ICD-10-CM | POA: Insufficient documentation

## 2023-11-11 DIAGNOSIS — N170 Acute kidney failure with tubular necrosis: Secondary | ICD-10-CM | POA: Diagnosis not present

## 2023-11-11 DIAGNOSIS — R0902 Hypoxemia: Secondary | ICD-10-CM | POA: Diagnosis not present

## 2023-11-11 DIAGNOSIS — E785 Hyperlipidemia, unspecified: Secondary | ICD-10-CM | POA: Diagnosis not present

## 2023-11-11 DIAGNOSIS — Z79899 Other long term (current) drug therapy: Secondary | ICD-10-CM | POA: Insufficient documentation

## 2023-11-11 DIAGNOSIS — R590 Localized enlarged lymph nodes: Secondary | ICD-10-CM | POA: Diagnosis not present

## 2023-11-11 DIAGNOSIS — E1122 Type 2 diabetes mellitus with diabetic chronic kidney disease: Secondary | ICD-10-CM | POA: Diagnosis not present

## 2023-11-11 DIAGNOSIS — R000 Tachycardia, unspecified: Secondary | ICD-10-CM | POA: Diagnosis not present

## 2023-11-11 DIAGNOSIS — I499 Cardiac arrhythmia, unspecified: Secondary | ICD-10-CM | POA: Diagnosis not present

## 2023-11-11 LAB — URINALYSIS, ROUTINE W REFLEX MICROSCOPIC
Bacteria, UA: NONE SEEN
Bilirubin Urine: NEGATIVE
Glucose, UA: >=500 mg/dL — AB
Hgb urine dipstick: NEGATIVE
Ketones, ur: NEGATIVE mg/dL
Leukocytes,Ua: NEGATIVE
Nitrite: NEGATIVE
Protein, ur: 30 mg/dL — AB
Specific Gravity, Urine: 1.025 (ref 1.005–1.030)
pH: 6 (ref 5.0–8.0)

## 2023-11-11 LAB — CBC
HCT: 47.4 % (ref 39.0–52.0)
Hemoglobin: 14.9 g/dL (ref 13.0–17.0)
MCH: 31.7 pg (ref 26.0–34.0)
MCHC: 31.4 g/dL (ref 30.0–36.0)
MCV: 100.9 fL — ABNORMAL HIGH (ref 80.0–100.0)
Platelets: 384 10*3/uL (ref 150–400)
RBC: 4.7 MIL/uL (ref 4.22–5.81)
RDW: 15 % (ref 11.5–15.5)
WBC: 14.7 10*3/uL — ABNORMAL HIGH (ref 4.0–10.5)
nRBC: 0 % (ref 0.0–0.2)

## 2023-11-11 LAB — COMPREHENSIVE METABOLIC PANEL
ALT: 8 U/L (ref 0–44)
AST: 15 U/L (ref 15–41)
Albumin: 3.3 g/dL — ABNORMAL LOW (ref 3.5–5.0)
Alkaline Phosphatase: 56 U/L (ref 38–126)
Anion gap: 10 (ref 5–15)
BUN: 16 mg/dL (ref 8–23)
CO2: 27 mmol/L (ref 22–32)
Calcium: 9 mg/dL (ref 8.9–10.3)
Chloride: 105 mmol/L (ref 98–111)
Creatinine, Ser: 1.48 mg/dL — ABNORMAL HIGH (ref 0.61–1.24)
GFR, Estimated: 46 mL/min — ABNORMAL LOW (ref >60)
Glucose, Bld: 155 mg/dL — ABNORMAL HIGH (ref 70–99)
Potassium: 3.5 mmol/L (ref 3.5–5.1)
Sodium: 142 mmol/L (ref 135–145)
Total Bilirubin: 0.9 mg/dL (ref 0.0–1.2)
Total Protein: 7.2 g/dL (ref 6.5–8.1)

## 2023-11-11 LAB — I-STAT CG4 LACTIC ACID, ED
Lactic Acid, Venous: 2.5 mmol/L (ref 0.5–1.9)
Lactic Acid, Venous: 1.3 mmol/L (ref 0.5–1.9)

## 2023-11-11 LAB — TROPONIN I (HIGH SENSITIVITY)
Troponin I (High Sensitivity): 12 ng/L (ref <18)
Troponin I (High Sensitivity): 20 ng/L — ABNORMAL HIGH (ref <18)
Troponin I (High Sensitivity): 20 ng/L — ABNORMAL HIGH (ref <18)

## 2023-11-11 LAB — RESP PANEL BY RT-PCR (RSV, FLU A&B, COVID)  RVPGX2
Influenza A by PCR: NEGATIVE
Influenza B by PCR: NEGATIVE
Resp Syncytial Virus by PCR: NEGATIVE
SARS Coronavirus 2 by RT PCR: NEGATIVE

## 2023-11-11 LAB — STREP PNEUMONIAE URINARY ANTIGEN: Strep Pneumo Urinary Antigen: NEGATIVE

## 2023-11-11 LAB — CBG MONITORING, ED: Glucose-Capillary: 106 mg/dL — ABNORMAL HIGH (ref 70–99)

## 2023-11-11 MED ORDER — SIMVASTATIN 20 MG PO TABS
20.0000 mg | ORAL_TABLET | Freq: Every day | ORAL | Status: DC
  Administered 2023-11-12: 20 mg via ORAL
  Filled 2023-11-11: qty 1

## 2023-11-11 MED ORDER — SODIUM CHLORIDE 0.9 % IV SOLN
2.0000 g | Freq: Once | INTRAVENOUS | Status: AC
  Administered 2023-11-11: 2 g via INTRAVENOUS
  Filled 2023-11-11: qty 20

## 2023-11-11 MED ORDER — INSULIN ASPART 100 UNIT/ML IJ SOLN
0.0000 [IU] | Freq: Three times a day (TID) | INTRAMUSCULAR | Status: DC
  Filled 2023-11-11: qty 0.15

## 2023-11-11 MED ORDER — LACTATED RINGERS IV BOLUS
1000.0000 mL | Freq: Once | INTRAVENOUS | Status: AC
  Administered 2023-11-11: 1000 mL via INTRAVENOUS

## 2023-11-11 MED ORDER — ACETAMINOPHEN 325 MG PO TABS: 650.0000 mg | ORAL_TABLET | Freq: Four times a day (QID) | ORAL | Status: DC | PRN

## 2023-11-11 MED ORDER — ARFORMOTEROL TARTRATE 15 MCG/2ML IN NEBU
15.0000 ug | INHALATION_SOLUTION | Freq: Two times a day (BID) | RESPIRATORY_TRACT | Status: DC
  Administered 2023-11-12: 15 ug via RESPIRATORY_TRACT
  Filled 2023-11-11: qty 2

## 2023-11-11 MED ORDER — IOHEXOL 350 MG/ML SOLN
75.0000 mL | Freq: Once | INTRAVENOUS | Status: AC | PRN
  Administered 2023-11-11: 75 mL via INTRAVENOUS

## 2023-11-11 MED ORDER — TAMSULOSIN HCL 0.4 MG PO CAPS: 0.4000 mg | ORAL_CAPSULE | Freq: Every day | ORAL | Status: DC

## 2023-11-11 MED ORDER — UMECLIDINIUM BROMIDE 62.5 MCG/ACT IN AEPB
1.0000 | INHALATION_SPRAY | Freq: Every day | RESPIRATORY_TRACT | Status: DC
  Administered 2023-11-12: 1 via RESPIRATORY_TRACT
  Filled 2023-11-11: qty 7

## 2023-11-11 MED ORDER — LACTATED RINGERS IV SOLN: INTRAVENOUS | Status: AC

## 2023-11-11 MED ORDER — SODIUM CHLORIDE 0.9 % IV SOLN
500.0000 mg | Freq: Once | INTRAVENOUS | Status: AC
  Administered 2023-11-11: 500 mg via INTRAVENOUS
  Filled 2023-11-11: qty 5

## 2023-11-11 MED ORDER — SODIUM CHLORIDE 0.9 % IV SOLN: 1.0000 g | INTRAVENOUS | Status: DC

## 2023-11-11 MED ORDER — SODIUM CHLORIDE 0.9 % IV SOLN: 500.0000 mg | INTRAVENOUS | Status: DC

## 2023-11-11 MED ORDER — ONDANSETRON HCL 4 MG/2ML IJ SOLN
4.0000 mg | Freq: Four times a day (QID) | INTRAMUSCULAR | Status: DC | PRN
Start: 2023-11-11 — End: 2023-11-12

## 2023-11-11 MED ORDER — INSULIN ASPART 100 UNIT/ML IJ SOLN
0.0000 [IU] | Freq: Every day | INTRAMUSCULAR | Status: DC
  Filled 2023-11-11: qty 0.05

## 2023-11-11 MED ORDER — SENNOSIDES-DOCUSATE SODIUM 8.6-50 MG PO TABS: 1.0000 | ORAL_TABLET | Freq: Every evening | ORAL | Status: DC | PRN

## 2023-11-11 MED ORDER — CLOPIDOGREL BISULFATE 75 MG PO TABS
75.0000 mg | ORAL_TABLET | Freq: Every day | ORAL | Status: DC
  Administered 2023-11-12: 75 mg via ORAL
  Filled 2023-11-11: qty 1

## 2023-11-11 MED ORDER — ONDANSETRON HCL 4 MG PO TABS: 4.0000 mg | ORAL_TABLET | Freq: Four times a day (QID) | ORAL | Status: DC | PRN

## 2023-11-11 MED ORDER — SODIUM CHLORIDE (PF) 0.9 % IJ SOLN
INTRAMUSCULAR | Status: AC
  Filled 2023-11-11: qty 50

## 2023-11-11 MED ORDER — ENOXAPARIN SODIUM 40 MG/0.4ML IJ SOSY
40.0000 mg | PREFILLED_SYRINGE | INTRAMUSCULAR | Status: DC
  Administered 2023-11-11: 40 mg via SUBCUTANEOUS
  Filled 2023-11-11: qty 0.4

## 2023-11-11 MED ORDER — ACETAMINOPHEN 650 MG RE SUPP: 650.0000 mg | Freq: Four times a day (QID) | RECTAL | Status: DC | PRN

## 2023-11-11 NOTE — ED Triage Notes (Signed)
 Per ems pt has lung ca was ready for first radiation treatment, suddenly had dizziness, near syncopal episode, initial BP 60/40 on EMS arrival then up to 90/60 tachy in 120s. EMS unable to complete orthostatic vitals d/t pt dizziness and near syncopal episode upon standing.  BG 248

## 2023-11-11 NOTE — Progress Notes (Signed)
 11:27 Pt called me and stated "[facility] is here" and handed the phone to a paramedic named Judeth Cornfield. Judeth Cornfield tells me the pt was feeling well and have syncopal episodes, but denies losing consciousness. She tells me he is also hypotensive. They hadn't completed a full set of orthostatic vitals yet, but going from laying to sitting the pt was 83/48. Judeth Cornfield wanted to check with the oncologist about the pt's appt for treatment. I conferred with Dr Arbutus Ped and at 11:42 I reached back out to the pt who states the paramedics are putting him in the ambulance.  I called [facility], spoke to [place], notified her that the pt is going to the ED via ambulance and won't be at his radiation appt today.

## 2023-11-11 NOTE — ED Provider Notes (Signed)
  Physical Exam  BP 127/70   Pulse 86   Temp (!) 97.4 F (36.3 C) (Oral)   Resp 18   Ht 6\' 2"  (1.88 m)   Wt 97.7 kg   SpO2 98%   BMI 27.65 kg/m   Physical Exam Vitals and nursing note reviewed.  Constitutional:      General: He is not in acute distress.    Appearance: He is well-developed.  HENT:     Head: Normocephalic and atraumatic.  Eyes:     Conjunctiva/sclera: Conjunctivae normal.  Cardiovascular:     Rate and Rhythm: Normal rate and regular rhythm.     Pulses: Normal pulses.     Heart sounds: Normal heart sounds. No murmur heard. Pulmonary:     Effort: Pulmonary effort is normal. No respiratory distress.     Breath sounds: Normal breath sounds.  Abdominal:     Palpations: Abdomen is soft.     Tenderness: There is no abdominal tenderness.  Musculoskeletal:        General: No swelling.     Cervical back: Neck supple.     Right lower leg: No edema.     Left lower leg: No edema.  Skin:    General: Skin is warm and dry.     Capillary Refill: Capillary refill takes less than 2 seconds.  Neurological:     Mental Status: He is alert.  Psychiatric:        Mood and Affect: Mood normal.     Procedures  Procedures  ED Course / MDM   Clinical Course as of 11/11/23 1908  Mon Nov 11, 2023  1703 Hx recently dx lung cancer, supposed to start chemo/rads next week, had sudden onset weakness, lightheadedness today, hypotensive with EMS, tachy.  CXR c/f PNA, CTA pending.  Anticipate admission, abx ordered. [JD]    Clinical Course User Index [JD] Laurence Spates, MD   Medical Decision Making Amount and/or Complexity of Data Reviewed Labs: ordered. Radiology: ordered. ECG/medicine tests: ordered.  Risk Prescription drug management. Decision regarding hospitalization.   CTA with postobstructive pneumonia.  Given cancer history with worsening postobstructive pneumonia as well as leukocytosis, I discussed the patient with the hospitalist and he was admitted for  further management.  Antibiotics administered.       Laurence Spates, MD 11/11/23 [phone]

## 2023-11-11 NOTE — ED Provider Notes (Signed)
 [place] EMERGENCY DEPARTMENT AT [facility] Provider Note   CSN: [id number] Arrival date & time: 11/11/23  1233     History  Chief Complaint  Patient presents with   Hypotension    Jonathan [name]. is a 88 y.o. male.  Pt with hx recently diagnosed lung cancer, has not yet started chemo/radiation, presents via EMS w hypotension. Pt indicates felt fine when got up and earlier today. States was doing things around his residence when he acutely felt generally weak, lightheaded, as if about to pass out. Was able to go to chair and sit, continued to feel weak/faint, and called EMS.  Denies any associated pain. No headache. No chest pain or discomfort. No sob or unusual doe. No abd pain or nvd. Is having normal bms, light brown, no melena or rectal bleeding. No dysuria or gu c/o. No extremity pain or swelling. Denies recent change in meds. No fever or chills. Occasional non prod cough. No sore throat, no sinus pain.  EMS notes initial bp 60/40 but improved to 90/60, CBG 248.   The history is provided by the patient, medical records and the EMS personnel.       Home Medications Prior to Admission medications   Medication Sig Start Date End Date Taking? Authorizing Provider  albuterol (VENTOLIN HFA) 108 (90 Base) MCG/ACT inhaler USE 1 TO 2 INHALATIONS EVERY 6 HOURS AS NEEDED FOR WHEEZING OR SHORTNESS OF BREATH 10/01/23   Young, Joni Fears D, MD  Blood Glucose Monitoring Suppl (FREESTYLE FREEDOM LITE) w/Device KIT Use Freestyle Freedom Lite meter to check blood sugar twice daily. DX:E11.65 11/17/21   Reather Littler, MD  clopidogrel (PLAVIX) 75 MG tablet Take 1 tablet (75 mg total) by mouth daily with breakfast. 10/01/23   Wanda Plump, MD  Dulaglutide (TRULICITY) 3 MG/0.5ML SOAJ INJECT 3 MG UNDER THE SKIN ONCE A WEEK 09/30/23   Thapa, [place], MD  empagliflozin (JARDIANCE) 25 MG TABS tablet Take 1 tablet (25 mg total) by mouth daily before breakfast. For diabetes E11.65 and E 11.29 08/01/23    Thapa, [place], MD  furosemide (LASIX) 40 MG tablet Take 1 tablet (40 mg total) by mouth daily. 09/30/23   Wanda Plump, MD  gabapentin (NEURONTIN) 100 MG capsule Take 200 mg by mouth daily. 01/02/23   [provider]  glucose blood (FREESTYLE LITE) test strip USE AS INSTRUCTED 11/25/22   Reather Littler, MD  metFORMIN (GLUCOPHAGE) 500 MG tablet Take 1 tablet (500 mg total) by mouth 2 (two) times daily with a meal. 08/01/23   Thapa, [place], MD  metoprolol (TOPROL-XL) 200 MG 24 hr tablet Take 100 mg by mouth daily.    [provider]  Multiple Vitamin (MULTIVITAMIN WITH MINERALS) TABS tablet Take 1 tablet by mouth daily.    [provider]  ondansetron (ZOFRAN) 8 MG tablet Take 1 tablet (8 mg total) by mouth every 8 (eight) hours as needed for nausea or vomiting. Start on the third day after chemotherapy. 10/23/23   Si Gaul, MD  OXYGEN Inhale 2 L/min into the lungs at bedtime. PRN during day    [provider]  prochlorperazine (COMPAZINE) 10 MG tablet Take 1 tablet (10 mg total) by mouth every 6 (six) hours as needed for nausea or vomiting. 10/23/23   Si Gaul, MD  sacubitril-valsartan (ENTRESTO) 24-26 MG Take 1 tablet by mouth 2 (two) times daily. 02/22/23   Zannie Cove, MD  simvastatin (ZOCOR) 20 MG tablet Take 20 mg by mouth daily.  [provider]  tamsulosin (FLOMAX) 0.4 MG CAPS capsule Take 1 capsule (0.4 mg total) by mouth daily after supper. 10/11/23   Wanda Plump, MD  Tiotropium Bromide-Olodaterol (STIOLTO RESPIMAT) 2.5-2.5 MCG/ACT AERS Inhale 2 puffs into the lungs daily. 05/06/23   Waymon Budge, MD      Allergies    Patient has no known allergies.    Review of Systems   Review of Systems  Constitutional:  Negative for chills, diaphoresis and fever.  HENT:  Negative for sore throat.   Eyes:  Negative for visual disturbance.  Respiratory:  Negative for shortness of breath.   Cardiovascular:  Negative for chest pain, palpitations  and leg swelling.  Gastrointestinal:  Negative for abdominal pain, blood in stool, diarrhea and vomiting.  Genitourinary:  Negative for dysuria and flank pain.  Musculoskeletal:  Negative for back pain and neck pain.  Skin:  Negative for rash.  Neurological:  Negative for headaches.    Physical Exam Updated Vital Signs BP 127/70   Pulse 86   Temp (!) 97.4 F (36.3 C) (Oral)   Resp 18   Ht 1.88 m (6\' 2" )   Wt 97.7 kg   SpO2 98%   BMI 27.65 kg/m  Physical Exam Vitals and nursing note reviewed.  Constitutional:      Appearance: Normal appearance. He is well-developed.  HENT:     Head: Atraumatic.     Nose: Nose normal.     Mouth/Throat:     Mouth: Mucous membranes are moist.     Pharynx: Oropharynx is clear.  Eyes:     General: No scleral icterus.    Conjunctiva/sclera: Conjunctivae normal.     Pupils: Pupils are equal, round, and reactive to light.  Neck:     Trachea: No tracheal deviation.     Comments: Trachea midline, thyroid not grossly enlarged or tender.  Cardiovascular:     Rate and Rhythm: Normal rate and regular rhythm.     Pulses: Normal pulses.     Heart sounds: Normal heart sounds. No murmur heard.    No friction rub. No gallop.  Pulmonary:     Effort: Pulmonary effort is normal. No accessory muscle usage or respiratory distress.     Breath sounds: Normal breath sounds.  Abdominal:     General: Bowel sounds are normal. There is no distension.     Palpations: Abdomen is soft. There is no mass.     Tenderness: There is no abdominal tenderness.     Comments: No pulsatile mass.   Genitourinary:    Comments: No cva tenderness. Musculoskeletal:        General: No swelling or tenderness.     Cervical back: Normal range of motion and neck supple. No rigidity.     Right lower leg: No edema.     Left lower leg: No edema.  Skin:    General: Skin is warm and dry.     Findings: No rash.  Neurological:     Mental Status: He is alert.     Comments: Alert,  speech clear. Motor/sens grossly intact bil.   Psychiatric:        Mood and Affect: Mood normal.     ED Results / Procedures / Treatments   Labs (all labs ordered are listed, but only abnormal results are displayed) Results for orders placed or performed during the hospital encounter of 11/11/23  CBC   Collection Time: 11/11/23  1:25 PM  Result Value Ref Range  WBC 14.7 (H) 4.0 - 10.5 K/uL   RBC 4.70 4.22 - 5.81 MIL/uL   Hemoglobin 14.9 13.0 - 17.0 g/dL   HCT 40.9 81.1 - 91.4 %   MCV 100.9 (H) 80.0 - 100.0 fL   MCH 31.7 26.0 - 34.0 pg   MCHC 31.4 30.0 - 36.0 g/dL   RDW 78.2 95.6 - 21.3 %   Platelets 384 150 - 400 K/uL   nRBC 0.0 0.0 - 0.2 %  Comprehensive metabolic panel   Collection Time: 11/11/23  1:25 PM  Result Value Ref Range   Sodium 142 135 - 145 mmol/L   Potassium 3.5 3.5 - 5.1 mmol/L   Chloride 105 98 - 111 mmol/L   CO2 27 22 - 32 mmol/L   Glucose, Bld 155 (H) 70 - 99 mg/dL   BUN 16 8 - 23 mg/dL   Creatinine, Ser 0.86 (H) 0.61 - 1.24 mg/dL   Calcium 9.0 8.9 - 57.8 mg/dL   Total Protein 7.2 6.5 - 8.1 g/dL   Albumin 3.3 (L) 3.5 - 5.0 g/dL   AST 15 15 - 41 U/L   ALT 8 0 - 44 U/L   Alkaline Phosphatase 56 38 - 126 U/L   Total Bilirubin 0.9 0.0 - 1.2 mg/dL   GFR, Estimated 46 (L) >60 mL/min   Anion gap 10 5 - 15  Troponin I (High Sensitivity)   Collection Time: 11/11/23  1:25 PM  Result Value Ref Range   Troponin I (High Sensitivity) 12 <18 ng/L  Urinalysis, Routine w reflex microscopic -Urine, Clean Catch   Collection Time: 11/11/23  2:43 PM  Result Value Ref Range   Color, Urine YELLOW YELLOW   APPearance CLEAR CLEAR   Specific Gravity, Urine 1.025 1.005 - 1.030   pH 6.0 5.0 - 8.0   Glucose, UA >=500 (A) NEGATIVE mg/dL   Hgb urine dipstick NEGATIVE NEGATIVE   Bilirubin Urine NEGATIVE NEGATIVE   Ketones, ur NEGATIVE NEGATIVE mg/dL   Protein, ur 30 (A) NEGATIVE mg/dL   Nitrite NEGATIVE NEGATIVE   Leukocytes,Ua NEGATIVE NEGATIVE   RBC / HPF 0-5 0 - 5  RBC/hpf   WBC, UA 0-5 0 - 5 WBC/hpf   Bacteria, UA NONE SEEN NONE SEEN   Squamous Epithelial / HPF 0-5 0 - 5 /HPF   Mucus PRESENT   I-Stat Lactic Acid   Collection Time: 11/11/23  2:48 PM  Result Value Ref Range   Lactic Acid, Venous 2.5 (HH) 0.5 - 1.9 mmol/L   Comment NOTIFIED PHYSICIAN   Troponin I (High Sensitivity)   Collection Time: 11/11/23  3:48 PM  Result Value Ref Range   Troponin I (High Sensitivity) 20 (H) <18 ng/L   *Note: Due to a large number of results and/or encounters for the requested time period, some results have not been displayed. A complete set of results can be found in Results Review.   DG Chest Port 1 View Result Date: 11/11/2023 CLINICAL DATA:  Weakness. EXAM: PORTABLE CHEST 1 VIEW COMPARISON:  10/01/2023. FINDINGS: Redemonstration of patient's known left hilar mass, better evaluated on the recent PET-CT scan from 10/25/2023. No significant interval change. There are additional opacities in the left lung which may represent postobstructive pneumonia. Correlate clinically. Bilateral lung fields are otherwise clear. Bilateral costophrenic angles are clear. Stable cardio-mediastinal silhouette. No acute osseous abnormalities. The soft tissues are within normal limits. IMPRESSION: Redemonstration of patient's known left hilar mass, better evaluated on the recent PET-CT scan from 10/25/2023. There are additional opacities in  the left lung which may represent postobstructive pneumonia. Electronically Signed   By: Jules Schick M.D.   On: 11/11/2023 15:47   NM PET Image Initial (PI) Skull Base To Thigh Result Date: 10/29/2023 CLINICAL DATA:  Initial treatment strategy for lung mass. EXAM: NUCLEAR MEDICINE PET SKULL BASE TO THIGH TECHNIQUE: 10.7 mCi F-18 FDG was injected intravenously. Full-ring PET imaging was performed from the skull base to thigh after the radiotracer. CT data was obtained and used for attenuation correction and anatomic localization. Fasting blood  glucose: 95 mg/dl COMPARISON:  46/96/2952 FINDINGS: Mediastinal blood pool activity: SUV max 2.36 Liver activity: SUV max NA NECK: No hypermetabolic lymph nodes in the neck. Incidental CT findings: None. CHEST: Spiculated, perihilar mass within the left upper lobe abuts the mediastinal pleural surface measuring 4.8 x 4.1 cm with SUV max 47.05, image 80/4. Signs of postobstructive pneumonitis noted within the periphery of the anterolateral left upper lobe, image 30/7. There is no additional tracer avid pulmonary nodule or mass identified bilaterally. Tracer avid ipsilateral mediastinal lymph nodes are identified. Index pre-vascular lymph node adjacent to the main pulmonary artery measures 1.2 cm with SUV max of 5.12. Anterior mediastinal lymph node adjacent to the anterior arch of the aorta measures 0.8 cm with SUV max of 3.38, image 71/4. Mild tracer uptake within the left hilum has an SUV max 3.91, image 81 of the fused PET-CT images. No tracer avid contralateral mediastinal or hilar lymph nodes. Incidental CT findings: Emphysema. Too small to characterize nodule within the right upper lobe measures 5 mm, image 34/7. Aortic atherosclerosis. Coronary artery calcifications. Trace pericardial effusion. ABDOMEN/PELVIS: No abnormal tracer uptake identified within the liver, pancreas, or spleen. Left adrenal nodule measures 1.3 cm with SUV max of 4.05. no tracer avid abdominopelvic lymph nodes. Incidental CT findings: Aortic atherosclerosis. Left kidney cyst measures 11.5 x 10.0 cm, image 140/4. This contains a single thin hairline area of septation with tiny calcification, image 139/4 and image 143/4. SKELETON: No focal hypermetabolic activity to suggest skeletal metastasis. Incidental CT findings: None. IMPRESSION: 1. The spiculated, perihilar mass within the left upper lobe is intensely FDG avid compatible with primary bronchogenic carcinoma. Mass is noted to abut the mediastinal pleura. 2. Signs of postobstructive  pneumonitis within the periphery of the anterolateral left upper lobe. 3. Tracer avid ipsilateral mediastinal and left hilar lymph nodes are identified compatible with nodal metastasis. 4. Left adrenal nodule is mildly FDG avid and is indeterminate. Cannot exclude adrenal metastasis. 5. Too small to characterize nodule within the right upper lobe measures 5 mm. 6. Coronary artery calcifications. 7. Aortic Atherosclerosis (ICD10-I70.0) and Emphysema (ICD10-J43.9). Electronically Signed   By: Signa Kell M.D.   On: 10/29/2023 14:17   MR BRAIN W WO CONTRAST Result Date: 10/28/2023 CLINICAL DATA:  Non-small cell lung cancer, staging EXAM: MRI HEAD WITHOUT AND WITH CONTRAST TECHNIQUE: Multiplanar, multiecho pulse sequences of the brain and surrounding structures were obtained without and with intravenous contrast. CONTRAST:  9mL GADAVIST GADOBUTROL 1 MMOL/ML IV SOLN COMPARISON:  No prior MRI available, correlation is made with CT head 02/08/2020 FINDINGS: Brain: No restricted diffusion to suggest acute or subacute infarct. No abnormal parenchymal or meningeal enhancement. No acute hemorrhage, mass, mass effect, or midline shift. No hydrocephalus or extra-axial collection. Pituitary and craniocervical junction within normal limits. No hemosiderin deposition to suggest remote hemorrhage. Normal cerebral volume for age. Dilated perivascular spaces in the basal ganglia. Remote lacunar infarcts in the left basal ganglia and bilateral periventricular white matter. T2  hyperintense signal in the periventricular white matter, likely the sequela of mild-to-moderate chronic small vessel ischemic disease. Vascular: Normal arterial flow voids. Normal arterial and venous enhancement. Skull and upper cervical spine: Normal marrow signal. Sinuses/Orbits: Mucosal thickening in the ethmoid air cells. Status post bilateral lens replacements. Other: The mastoid air cells are well aerated. IMPRESSION: No acute intracranial process. No  evidence of intracranial metastatic disease. Electronically Signed   By: Wiliam Ke M.D.   On: 10/28/2023 11:25     EKG EKG Interpretation Date/Time:  Monday November 11 2023 13:43:05 EST Ventricular Rate:  87 PR Interval:  239 QRS Duration:  71 QT Interval:  366 QTC Calculation: 441 R Axis:   31  Text Interpretation: Sinus rhythm Prolonged PR interval Nonspecific T wave abnormality Confirmed by Cathren Laine ([id number]) on 11/11/2023 2:19:22 PM  Radiology DG Chest Port 1 View Result Date: 11/11/2023 CLINICAL DATA:  Weakness. EXAM: PORTABLE CHEST 1 VIEW COMPARISON:  10/01/2023. FINDINGS: Redemonstration of patient's known left hilar mass, better evaluated on the recent PET-CT scan from 10/25/2023. No significant interval change. There are additional opacities in the left lung which may represent postobstructive pneumonia. Correlate clinically. Bilateral lung fields are otherwise clear. Bilateral costophrenic angles are clear. Stable cardio-mediastinal silhouette. No acute osseous abnormalities. The soft tissues are within normal limits. IMPRESSION: Redemonstration of patient's known left hilar mass, better evaluated on the recent PET-CT scan from 10/25/2023. There are additional opacities in the left lung which may represent postobstructive pneumonia. Electronically Signed   By: Jules Schick M.D.   On: 11/11/2023 15:47    Procedures Procedures    Medications Ordered in ED Medications  cefTRIAXone (ROCEPHIN) 2 g in sodium chloride 0.9 % 100 mL IVPB (has no administration in time range)  azithromycin (ZITHROMAX) 500 mg in sodium chloride 0.9 % 250 mL IVPB (has no administration in time range)  lactated ringers bolus 1,000 mL (0 mLs Intravenous Stopped 11/11/23 1550)  lactated ringers bolus 1,000 mL (1,000 mLs Intravenous New Bag/Given 11/11/23 1550)  iohexol (OMNIPAQUE) 350 MG/ML injection 75 mL (75 mLs Intravenous Contrast Given 11/11/23 1633)    ED Course/ Medical Decision Making/  A&P Clinical Course as of 11/11/23 1718  Mon Nov 11, 2023  1703 Hx recently dx lung cancer, supposed to start chemo/rads next week, had sudden onset weakness, lightheadedness today, hypotensive with EMS, tachy.  CXR c/f PNA, CTA pending.  Anticipate admission, abx ordered. [JD]    Clinical Course User Index [JD] Laurence Spates, MD                                 Medical Decision Making Problems Addressed: Elevated lactic acid level: acute illness or injury General weakness: acute illness or injury with systemic symptoms that poses a threat to life or bodily functions Hypotension, unspecified hypotension type: acute illness or injury with systemic symptoms that poses a threat to life or bodily functions Metastatic non-small cell lung cancer [facility]): chronic illness or injury with exacerbation, progression, or side effects of treatment that poses a threat to life or bodily functions Pulmonary infiltrate: acute illness or injury    Details: R/o pna, vs pe, vs other  Amount and/or Complexity of Data Reviewed Independent Historian: EMS    Details: hx External Data Reviewed: notes. Labs: ordered. Decision-making details documented in ED Course. Radiology: ordered and independent interpretation performed. Decision-making details documented in ED Course. ECG/medicine tests: ordered and independent interpretation performed.  Decision-making details documented in ED Course. Discussion of management or test interpretation with external provider(s): medicine  Risk Prescription drug management. Decision regarding hospitalization.   Iv ns. Continuous pulse ox and cardiac monitoring. Labs ordered/sent. Imaging ordered.   Differential diagnosis includes infection, uti, anemia, acs, PE, etc. Dispo decision including potential need for admission considered - will get labs and imaging and reassess.   Reviewed nursing notes and prior charts for additional history. External reports reviewed.  Additional history from: EMS.   Cardiac monitor: sinus rhythm, rate 98.  Labs reviewed/interpreted by me - wbc elev. Lactate elev. LR bolus. Trop normal.   Rocephin and zithromax iv.   Xrays reviewed/interpreted by me - left hilar mass, r/o post obst pna vs other acute process.   CT reviewed/interpreted by me - pnd.   Delta lact, and CT chest pending.   1715, CTA and delta lactate pending - signed out to Dr Earlene Plater to check cta when resulted and facilitate admission.   CRITICAL CARE Re: hypotension, lung cancer. R/o post obstructive pna/sepsis, r/o pe, elevated lactate Performed by: Suzi Roots Total critical care time: 45 minutes Critical care time was exclusive of separately billable procedures and treating other patients. Critical care was necessary to treat or prevent imminent or life-threatening deterioration. Critical care was time spent personally by me on the following activities: development of treatment plan with patient and/or surrogate as well as nursing, discussions with consultants, evaluation of patient's response to treatment, examination of patient, obtaining history from patient or surrogate, ordering and performing treatments and interventions, ordering and review of laboratory studies, ordering and review of radiographic studies, pulse oximetry and re-evaluation of patient's condition.         Final Clinical Impression(s) / ED Diagnoses Final diagnoses:  Metastatic non-small cell lung cancer (HCC)  General weakness  Hypotension, unspecified hypotension type  Elevated lactic acid level  Pulmonary infiltrate    Rx / DC Orders ED Discharge Orders     None         Cathren Laine, MD 11/11/23 1719

## 2023-11-11 NOTE — H&P (Addendum)
 History and Physical  Jonathan [name]. GNF:[id number] DOB: 09/05/1936 DOA: 11/11/2023  PCP: Wanda Plump, MD   Chief Complaint: Dizziness, generalized weakness  HPI: Jonathan [name]. is a 88 y.o. 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 Patient reports she has had ongoing cough with intermittent hemoptysis over the last few days.  Scheduled to start radiation therapy tomorrow. States he felt fine when he woke up this morning. He went to use the bathroom and suddenly started feeling dizzy and weak. States he felt like he was going to pass out but did not have a syncope episode.  He sat down right away and called EMS.  He reports ongoing generalized weakness, recent poor p.o. intake and intermittent hemoptysis but denies any fevers, chills, shortness of breath, chest pain, headaches, dysuria, hematuria, bloody stools or vision changes. States he is on 2 L of [place] as needed at home. On EMS arrival, patient was found to have BP 60/40 which improved to 90s/60s with HR in the 120s.  ED Course: Initial vitals were temp 97.5, RR 16, HR 100, BP 117/68, SpO2 100% on room air. CBC shows stable leukocytosis of 14.7 (from 14.8 6 weeks ago), AKI with creatinine of 1.48, blood glucose 155, troponin 20->20, lactic acid 2.5->1.3, UA shows significant glucosuria and proteinuria but no signs of infection, negative RSV, COVID and flu test. CXR known left hilar mass and opacities in the left lung concerning for postobstructive pneumonia. CTA chest PE study negative for PE but shows 5.4 cm left perihilar mass that is slightly enlarged compared to prior study as well as increasing postobstructive pneumonia in the left lingula.  Patient was given IV LR 1 L bolus x 2,  IV Rocephin and IV azithromycin. TR[facility] was consulted for admission  Review of Systems: Please see HPI for pertinent positives and negatives. A complete 10 system review of systems are otherwise negative.  Past Medical History:  Diagnosis Date   CAD (coronary artery disease)    Two RCA stents remotely / 3rd RCA stent 2006   Colon polyps    s/p several Cscopes.   COPD (chronic obstructive pulmonary disease) (HCC)    on O2, nocturnal   Diabetes mellitus    dx aprox 2009   ED (erectile dysfunction)    has a vacumm device   Ejection fraction    Hyperlipidemia    dx in 90s   Hypertension    dx in the 46   Hypogonadism male    PVD (peripheral vascular disease) (HCC)    s/p stents at LE 2009, Dr Jacinto Halim   Shortness of breath    O2 Sat dropped to 82% walking on the treadmill, September, 2012   Past Surgical History:  Procedure Laterality Date   APPENDECTOMY     BRONCHIAL BIOPSY  10/01/2023   Procedure: BRONCHIAL BIOPSIES;  Surgeon: Josephine Igo, DO;  Location: [facility];  Service: Pulmonary;;   BRONCHIAL BRUSHINGS  10/01/2023   Procedure: BRONCHIAL BRUSHINGS;  Surgeon: Josephine Igo, DO;  Location: [facility];  Service: Pulmonary;;   BRONCHIAL WASHINGS  10/01/2023   Procedure: BRONCHIAL WASHINGS;  Surgeon: Josephine Igo, DO;  Location: [facility];  Service: Pulmonary;;   CORONARY STENT INTERVENTION  N/A 01/12/2022   Procedure: CORONARY STENT INTERVENTION;  Surgeon: Swazil[place], Peter M, MD;  Location: [facility] INVASIVE CV LAB;  Service: Cardiovascular;  Laterality: N/A;   HEMOSTASIS CONTROL  10/01/2023   Procedure: HEMOSTASIS CONTROL;  Surgeon: Josephine Igo, DO;  Location: [facility];  Service: Pulmonary;;   LEFT HEART CATH AND CORONARY ANGIOGRAPHY N/A 03/13/2023   Procedure: LEFT HEART CATH AND CORONARY ANGIOGRAPHY;  Surgeon: Swazil[place], Peter M, MD;  [facility] INVAS[facility] CV LAB;  Service: Cardiovascular;  Laterality: N/A;   RIGHT/LEFT HEART CATH AND CORONARY ANGIOGRAPHY N/A  01/11/2022   Procedure: RIGHT/LEFT HEART CATH AND CORONARY ANGIOGRAPHY;  Surgeon: Lyn Records, MD;  Location: [facility] INVASIVE CV LAB;  Service: Cardiovascular;  Laterality: N/A;   TONSILLECTOMY     VIDEO BRONCHOSCOPY  10/01/2023   Procedure: VIDEO BRONCHOSCOPY WITHOUT FLUORO;  Surgeon: Josephine Igo, DO;  Location: [facility];  Service: Pulmonary;;   Social History:  reports that he quit smoking about 46 years ago. His smoking use included cigarettes. He started smoking about 76 years ago. He has a 60 pack-year smoking history. He has never used smokeless tobacco. He reports that he does not currently use alcohol. He reports that he does not use drugs.  No Known Allergies  Family History  Problem Relation Age of Onset   Heart disease Father    Hypertension Father    Stroke Father    Diabetes Paternal Aunt    Diabetes Maternal Grandmother    Diabetes Other        GM, nephews, many family members   Hyperlipidemia Other        ?   Prostate cancer Brother    Colon cancer Neg Hx      Prior to Admission medications   Medication Sig Start Date End Date Taking? Authorizing Provider  albuterol (VENTOLIN HFA) 108 (90 Base) MCG/ACT inhaler USE 1 TO 2 INHALATIONS EVERY 6 HOURS AS NEEDED FOR WHEEZING OR SHORTNESS OF BREATH 10/01/23   Young, Joni Fears D, MD  Blood Glucose Monitoring Suppl (FREESTYLE FREEDOM LITE) w/Device KIT Use Freestyle Freedom Lite meter to check blood sugar twice daily. DX:E11.65 11/17/21   Reather Littler, MD  clopidogrel (PLAVIX) 75 MG tablet Take 1 tablet (75 mg total) by mouth daily with breakfast. 10/01/23   Wanda Plump, MD  Dulaglutide (TRULICITY) 3 MG/0.5ML SOAJ INJECT 3 MG UNDER THE SKIN ONCE A WEEK 09/30/23   Thapa, [place], MD  empagliflozin (JARDIANCE) 25 MG TABS tablet Take 1 tablet (25 mg total) by mouth daily before breakfast. For diabetes E11.65 and E 11.29 08/01/23   Thapa, [place], MD  furosemide (LASIX) 40 MG tablet Take 1 tablet (40 mg total) by mouth daily. 09/30/23    Wanda Plump, MD  gabapentin (NEURONTIN) 100 MG capsule Take 200 mg by mouth daily. 01/02/23   [provider]  glucose blood (FREESTYLE LITE) test strip USE AS INSTRUCTED 11/25/22   Reather Littler, MD  metFORMIN (GLUCOPHAGE) 500 MG tablet Take 1 tablet (500 mg total) by mouth 2 (two) times daily with a meal. 08/01/23   Thapa, [place], MD  metoprolol (TOPROL-XL) 200 MG 24 hr tablet Take 100 mg by mouth daily.    [provider]  Multiple Vitamin (MULTIVITAMIN WITH MINERALS) TABS tablet Take 1 tablet by mouth daily.    [provider]  ondansetron (ZOFRAN) 8 MG tablet Take 1 tablet (8 mg total) by mouth every 8 (eight) hours as needed for nausea or vomiting. Start on the third  day after chemotherapy. 10/23/23   Si Gaul, MD  OXYGEN Inhale 2 L/min into the lungs at bedtime. PRN during day    [provider]  prochlorperazine (COMPAZINE) 10 MG tablet Take 1 tablet (10 mg total) by mouth every 6 (six) hours as needed for nausea or vomiting. 10/23/23   Si Gaul, MD  sacubitril-valsartan (ENTRESTO) 24-26 MG Take 1 tablet by mouth 2 (two) times daily. 02/22/23   Zannie Cove, MD  simvastatin (ZOCOR) 20 MG tablet Take 20 mg by mouth daily.    [provider]  tamsulosin (FLOMAX) 0.4 MG CAPS capsule Take 1 capsule (0.4 mg total) by mouth daily after supper. 10/11/23   Wanda Plump, MD  Tiotropium Bromide-Olodaterol (STIOLTO RESPIMAT) 2.5-2.5 MCG/ACT AERS Inhale 2 puffs into the lungs daily. 05/06/23   Waymon Budge, MD    Physical Exam: BP 138/80   Pulse 83   Temp 97.9 F (36.6 C)   Resp (!) 23   Ht 6\' 2"  (1.88 m)   Wt 97.7 kg   SpO2 100%   BMI 27.65 kg/m  General: Pleasant, well-appearing elderly man laying in bed. No acute distress. HEENT: [place]/AT. Anicteric sclera CV: RRR. No murmurs, rubs, or gallops. No LE edema Pulmonary: Lungs CTAB. Normal effort. No wheezing or rales. Decreased breath sounds at the bases. Abdominal: Soft, nontender,  nondistended. Normal bowel sounds. Extremities: Palpable radial and DP pulses. Normal ROM. Skin: Warm and dry. No obvious rash or lesions. Neuro: A&Ox3. Moves all extremities. Normal sensation to light touch. No focal deficit. Psych: Normal mood and affect          Labs on Admission:  Basic Metabolic Panel: Recent Labs  Lab 11/11/23 1325  NA 142  K 3.5  CL 105  CO2 27  GLUCOSE 155*  BUN 16  CREATININE 1.48*  CALCIUM 9.0   Liver Function Tests: Recent Labs  Lab 11/11/23 1325  AST 15  ALT 8  ALKPHOS 56  BILITOT 0.9  PROT 7.2  ALBUMIN 3.3*   No results for input(s): "LIPASE", "AMYLASE" in the last 168 hours. No results for input(s): "AMMONIA" in the last 168 hours. CBC: Recent Labs  Lab 11/11/23 1325  WBC 14.7*  HGB 14.9  HCT 47.4  MCV 100.9*  PLT 384   Cardiac Enzymes: No results for input(s): "CKTOTAL", "CKMB", "CKMBINDEX", "TROPONINI" in the last 168 hours. BNP (last 3 results) Recent Labs    12/18/22 1454 02/21/23 0338 03/05/23 0955  BNP 431.8* 464.6* 575.0*    ProBNP (last 3 results) No results for input(s): "PROBNP" in the last 8760 hours.  CBG: No results for input(s): "GLUCAP" in the last 168 hours.  Radiological Exams on Admission: CT Angio Chest PE W/Cm &/Or Wo Cm Result Date: 11/11/2023 CLINICAL DATA:  Pulmonary embolus suspected with high probability. EXAM: CT ANGIOGRAPHY CHEST WITH CONTRAST TECHNIQUE: Multidetector CT imaging of the chest was performed using the standard protocol during bolus administration of intravenous contrast. Multiplanar CT image reconstructions and MIPs were obtained to evaluate the vascular anatomy. RADIATION DOSE REDUCTION: This exam was performed according to the departmental dose-optimization program which includes automated exposure control, adjustment of the mA and/or kV according to patient size and/or use of iterative reconstruction technique. CONTRAST:  75mL OMNIPAQUE IOHEXOL 350 MG/ML SOLN COMPARISON:  Chest  radiograph 11/11/2023. PET-CT 10/25/2023. CT chest 09/19/2023 FINDINGS: Cardiovascular: Technically adequate study with good opacification of the central and segmental pulmonary arteries. Mild motion artifact. No focal filling defects are identified. No evidence of  significant pulmonary embolus. Slow flow demonstrated in the left lingular pulmonary artery corresponding to likely extrinsic compression related to known left lung mass lesion. Mild cardiac enlargement. Trace pericardial effusion. Normal caliber thoracic aorta with calcification. Mediastinum/Nodes: Thyroid gland is unremarkable. Esophagus is decompressed. Mediastinal lymph nodes are present with largest left aortopulmonic window nodes measuring about 12 mm short axis dimension. Lymph nodes are similar to prior study and demonstrated activity on recent PET-CT scan suggesting metastatic involvement. Lungs/Pleura: Left perihilar mass lesion measuring about 4.6 x 5.4 cm in diameter, mildly enlarged since prior study. There is also increased postobstructive pneumonia in the left lingula which may account for some of the change in size. Progression is not excluded. Prominent emphysematous changes throughout the lungs. Atelectasis in the lung bases. Upper Abdomen: No acute abnormalities.  No adrenal gland nodules. Musculoskeletal: Degenerative changes in the spine. No metastatic lesions identified. Review of the MIP images confirms the above findings. IMPRESSION: 1. 5.4 cm diameter left perihilar mass lesion corresponding to known bronchogenic carcinoma and suggesting enlargement since prior study. 2. Increasing postobstructive pneumonia in the left lingula. 3. No evidence of significant pulmonary embolus. There is evidence of slow flow in the left lingular pulmonary artery which likely results from extrinsic compression due to the mass. 4. Severe emphysematous changes in the lungs. 5. Aortic atherosclerosis. 6. Mediastinal lymphadenopathy is unchanged since  prior studies but likely metastatic. Electronically Signed   By: Burman Nieves M.D.   On: 11/11/2023 18:39   DG Chest Port 1 View Result Date: 11/11/2023 CLINICAL DATA:  Weakness. EXAM: PORTABLE CHEST 1 VIEW COMPARISON:  10/01/2023. FINDINGS: Redemonstration of patient's known left hilar mass, better evaluated on the recent PET-CT scan from 10/25/2023. No significant interval change. There are additional opacities in the left lung which may represent postobstructive pneumonia. Correlate clinically. Bilateral lung fields are otherwise clear. Bilateral costophrenic angles are clear. Stable cardio-mediastinal silhouette. No acute osseous abnormalities. The soft tissues are within normal limits. IMPRESSION: Redemonstration of patient's known left hilar mass, better evaluated on the recent PET-CT scan from 10/25/2023. There are additional opacities in the left lung which may represent postobstructive pneumonia. Electronically Signed   By: Jules Schick M.D.   On: 11/11/2023 15:47   Assessment/Plan Jonathan [name]. is a 88 y.o. 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 intake. Patient also reports ongoing cough with intermittent hemoptysis. Found to have evidence of postobstructive pneumonia on imaging.  He is in no respiratory distress but reports using 2 L [place] intermittently at home. -Admit to MedSurg for observation -Continue IV Rocephin and azithromycin -Follow-up sputum culture -Check MRSA screen, full RVP,  procalcitonin, urinary Legionella and strep pneumo -Trend CBC, fever curve -Incentive spirometer, flutter valve -Supplemental O2 as needed  # AKI on CKD 3B Baseline creatinine around 1.2-1.3. Bump in creatinine to 1.48 likely due to ATN in the setting of his hypotension from his recent poor p.o. intake and dehydration. S/p IV LR 2 L bolus in the ED. -IV LR 100 cc/h for  10 hours -Avoid nephrotoxic agents -Trend renal function  # NSCLC, squamous cell carcinoma in the left upper lobe Patient had 16-day hospitalization in December 2024 after presenting with hemoptysis and pneumonia. CT scan on September 19, 2023, showed an aggressive mass in the central left upper lobe with trace pleural effusion. Bronchoscopy and biopsy on October 01, 2023, confirmed squamous cell carcinoma.  Thought to be stage 3A due to tumor size and location.  Reports on option for patient so concurrent chemotherapy and radiation was recommended.  Patient scheduled to start radiation tomorrow. -Patient's oncologists, Dr. Mitzi Hansen and Dr. Arbutus Ped added to care team  # Chronic combined systolic and diastolic HF # Hypertension Last TTE 6 weeks ago showed EF 55 to 60%, mild LVH, G1 DD, moderately elevated LA. patient dry on exam with no evidence of CHF exacerbation.  BP stable SBP in the 110s to 130s. -Hold GDMT in the setting of dehydration and AKI, resume as appropriate -Continue supplemental O2 as needed  # T2DM Last A1c 6.9% 2 months ago. -Discussed with meals and at bedtime -Hold Jardiance in the setting of volume depletion  # COPD No evidence of COPD exacerbation. On oxygen at home as needed -Continue home bronchodilators -[place] O2 as needed  # CAD # PVD # HLD -Continue simvastatin and Plavix  # BPH -Continue tamsulosin  DVT prophylaxis: Lovenox     Code Status: Full Code  Consults called: None  Family Communication: No family at bedside  Severity of Illness: The appropriate patient status for this  patient is OBSERVATION. Observation status is judged to be reasonable and necessary in order to provide the required intensity of service to ensure the patient's safety. The patient's presenting symptoms, physical exam findings, and initial radiographic and laboratory data in the context of their medical condition is felt to place them at decreased risk for further clinical deterioration. Furthermore, it is anticipated that the patient will be medically stable for discharge from the hospital within 2 midnights of admission.   Level of care: Med-Surg   Steffanie Rainwater, MD 11/11/2023, 8:40 PM [facility] Pager: [phone] Isaiah 41:10   If 7PM-7AM, please contact night-coverage [email or web address] Password [email or web address]

## 2023-11-12 ENCOUNTER — Inpatient Hospital Stay: Payer: Medicare Other

## 2023-11-12 ENCOUNTER — Other Ambulatory Visit: Payer: Self-pay

## 2023-11-12 ENCOUNTER — Ambulatory Visit
Admission: RE | Admit: 2023-11-12 | Discharge: 2023-11-12 | Disposition: A | Discharge status: Home / Self Care | Payer: Medicare Other | Source: Ambulatory Visit | Attending: Radiation Oncology | Admitting: Radiation Oncology

## 2023-11-12 DIAGNOSIS — Z510 Encounter for antineoplastic radiation therapy: Secondary | ICD-10-CM | POA: Diagnosis not present

## 2023-11-12 DIAGNOSIS — J189 Pneumonia, unspecified organism: Secondary | ICD-10-CM | POA: Diagnosis not present

## 2023-11-12 DIAGNOSIS — Z87891 Personal history of nicotine dependence: Secondary | ICD-10-CM | POA: Diagnosis not present

## 2023-11-12 DIAGNOSIS — R531 Weakness: Secondary | ICD-10-CM

## 2023-11-12 DIAGNOSIS — C3412 Malignant neoplasm of upper lobe, left bronchus or lung: Secondary | ICD-10-CM | POA: Diagnosis not present

## 2023-11-12 DIAGNOSIS — N1832 Chronic kidney disease, stage 3b: Secondary | ICD-10-CM

## 2023-11-12 DIAGNOSIS — J188 Other pneumonia, unspecified organism: Secondary | ICD-10-CM | POA: Diagnosis not present

## 2023-11-12 DIAGNOSIS — C3490 Malignant neoplasm of unspecified part of unspecified bronchus or lung: Principal | ICD-10-CM

## 2023-11-12 LAB — CBC
HCT: 39.9 % (ref 39.0–52.0)
Hemoglobin: 12.6 g/dL — ABNORMAL LOW (ref 13.0–17.0)
MCH: 31.8 pg (ref 26.0–34.0)
MCHC: 31.6 g/dL (ref 30.0–36.0)
MCV: 100.8 fL — ABNORMAL HIGH (ref 80.0–100.0)
Platelets: 324 10*3/uL (ref 150–400)
RBC: 3.96 MIL/uL — ABNORMAL LOW (ref 4.22–5.81)
RDW: 15 % (ref 11.5–15.5)
WBC: 9.6 10*3/uL (ref 4.0–10.5)
nRBC: 0 % (ref 0.0–0.2)

## 2023-11-12 LAB — BASIC METABOLIC PANEL
Anion gap: 6 (ref 5–15)
BUN: 17 mg/dL (ref 8–23)
CO2: 28 mmol/L (ref 22–32)
Calcium: 8.4 mg/dL — ABNORMAL LOW (ref 8.9–10.3)
Chloride: 106 mmol/L (ref 98–111)
Creatinine, Ser: 1.28 mg/dL — ABNORMAL HIGH (ref 0.61–1.24)
GFR, Estimated: 55 mL/min — ABNORMAL LOW (ref >60)
Glucose, Bld: 123 mg/dL — ABNORMAL HIGH (ref 70–99)
Potassium: 3.3 mmol/L — ABNORMAL LOW (ref 3.5–5.1)
Sodium: 140 mmol/L (ref 135–145)

## 2023-11-12 LAB — RESPIRATORY PANEL BY PCR

## 2023-11-12 LAB — D-DIMER, QUANTITATIVE: D-Dimer, Quant: 0.64 ug{FEU}/mL — ABNORMAL HIGH (ref 0.00–0.50)

## 2023-11-12 LAB — RAD ONC ARIA SESSION SUMMARY
Course Elapsed Days: 0
Plan Fractions Treated to Date: 1
Plan Prescribed Dose Per Fraction: 2 Gy
Plan Total Fractions Prescribed: 30
Plan Total Prescribed Dose: 60 Gy
Reference Point Dosage Given to Date: 2 Gy
Reference Point Session Dosage Given: 2 Gy
Session Number: 1

## 2023-11-12 LAB — CBG MONITORING, ED
Glucose-Capillary: 101 mg/dL — ABNORMAL HIGH (ref 70–99)
Glucose-Capillary: 131 mg/dL — ABNORMAL HIGH (ref 70–99)

## 2023-11-12 LAB — PROCALCITONIN: Procalcitonin: 0.11 ng/mL

## 2023-11-12 MED ORDER — AZITHROMYCIN 500 MG PO TABS: 500.0000 mg | ORAL_TABLET | Freq: Every day | ORAL | 0 refills | Status: DC

## 2023-11-12 MED ORDER — AMOXICILLIN 500 MG PO TABS: 500.0000 mg | ORAL_TABLET | Freq: Two times a day (BID) | ORAL | 0 refills | Status: DC

## 2023-11-12 MED ORDER — AZITHROMYCIN 500 MG PO TABS: 500.0000 mg | ORAL_TABLET | Freq: Every day | ORAL | 0 refills | Status: AC

## 2023-11-12 MED ORDER — AMOXICILLIN 500 MG PO TABS: 500.0000 mg | ORAL_TABLET | Freq: Two times a day (BID) | ORAL | 0 refills | Status: AC

## 2023-11-12 NOTE — ED Notes (Signed)
 Patient request medication be sent to [facility] on [address]

## 2023-11-12 NOTE — Care Management Obs Status (Signed)
 MEDICARE OBSERVATION STATUS NOTIFICATION   Patient Details  Name: Jonathan [name]. MRN: [id number] Date of Birth: 1935-12-08   Medicare Observation Status Notification Given:  Yes    Lavenia Atlas, RN 11/12/2023, 5:51 PM

## 2023-11-12 NOTE — Progress Notes (Signed)
 I called the pt to let him know that he wouldn't be able to get treated with chemotherapy today because he had not yet been discharged from the hospital and would not be able to get treatment for at least 24hrs. Per Dr Arbutus Ped, pt was notified that he can get just his radiation this week and start his chemotherapy next week. Pt verbalized understanding.

## 2023-11-12 NOTE — ED Notes (Signed)
 Patient calling sister to pick him up from ER

## 2023-11-12 NOTE — Discharge Summary (Addendum)
 Physician Discharge Summary   Patient: Jonathan [name]. MRN: [id number] DOB: 09/17/1936  Admit date:     11/11/2023  Discharge date: 11/12/23  Discharge Physician: Tobey Grim   PCP: Wanda Plump, MD   Recommendations at discharge:   Ensure patient has finished his 5-day total course of antibiotics.  Ensure no further complications or sequelae from pneumonia. Ensure patient is following up with oncology for radiation/chemotherapy. Patient was hypotensive on admission/reason for admission.  He is on a combination of furosemide, Jardiance, metoprolol, Entresto.  Although metoprolol and Entresto are not diuretics they are blood pressure medications and when used in combination with furosemide and Jardiance they can promote hypotension.  Blood pressure was actually elevated prior to discharge make sure patient's blood pressure is within normal range or a little higher based on his age.  Discharge Diagnoses: Principal Problem:   Postobstructive pneumonia Active Problems:   Acute renal failure with acute tubular necrosis superimposed on stage 3b chronic kidney disease (HCC)   Metastatic non-small cell lung cancer (HCC)   General weakness  Resolved Problems:   * No resolved hospital problems. *  Hospital Course: Jonathan Fleener. is a 88 y.o. 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    Patient reports she has had ongoing cough with intermittent hemoptysis over the last few days.  Scheduled to start radiation therapy tomorrow. States he felt fine when he woke up this morning. He went to use the bathroom and suddenly started feeling dizzy and weak. States he felt like he  was going to pass out but did not have a syncope episode.  He sat down right away and called EMS.  He reports ongoing generalized weakness, recent poor p.o. intake and intermittent hemoptysis but denies any fevers, chills, shortness of breath, chest pain, headaches, dysuria, hematuria, bloody stools or vision changes. States he is on 2 L of [place] as needed at home.  On EMS arrival, patient was found to have BP 60/40 which improved to 90s/60s with HR in the 120s.  Patient admitted to hospitalist service but due to beds being full he never actually left the ER.  His blood pressure continued to creep upwards while in house to the 130s and 140s systolic on the day of discharge.  On day of discharge he reportedly felt great.  I got him up and walked around the room and he felt fine.  Orthostatics on day of discharge are actually elevated blood pressure.  Due to treatment of community acquired pneumonia with oral antibiotics infected looks so well he was discharged home on 2/25.     # Postobstructive pneumonia Diagnosed by physical exam and CT. -Started on ceftriaxone and azithromycin. -After 24 hours he was switched to amoxicillin and azithromycin.  Discharged home on these as well for total course of 5 days. -Looked and felt great on discharge.  Patient was also very anxious to leave as he is primary caregiver for his wife who has dementia.   # AKI on CKD 3B Patient's creatinine had resolved back to his baseline after IV fluids and oral rehydration.   # NSCLC, squamous cell carcinoma in the left upper lobe Patient had  16-day hospitalization in December 2024 after presenting with hemoptysis and pneumonia. CT scan on September 19, 2023, showed an aggressive mass in the central left upper lobe with trace pleural effusion. Bronchoscopy and biopsy on October 01, 2023, confirmed squamous cell carcinoma.  Thought to be stage 3A due to tumor size and location.  Reports on option for patient so concurrent chemotherapy  and radiation was recommended.  Patient scheduled to start radiation tomorrow. -Oncology/cancer center contacted patient while he was in house and rescheduled him.   # Chronic combined systolic and diastolic HF # Hypertension Last TTE 6 weeks ago showed EF 55 to 60%, mild LVH, G1 DD, moderately elevated LA. patient dry on exam with no evidence of CHF exacerbation.  BP stable SBP in the 110s to 130s. -Hold GDMT in the setting of dehydration and AKI, resume as appropriate -Continue supplemental O2 as needed -Of note he was particularly hypotensive on admission.  Follow-up to ensure that Jardiance plus antihypertensive plus Lasix are not causing hypotension.   # T2DM Last A1c 6.9% 2 months ago. -Discussed with meals and at bedtime -Hold Jardiance in the setting of volume depletion   # COPD No evidence of COPD exacerbation. On oxygen at home as needed -Continue home bronchodilators -[place] O2 as needed   # CAD # PVD # HLD -Continue simvastatin and Plavix   # BPH -Continue tamsulosin      Procedures performed: None Disposition: Home Diet recommendation:  Discharge Diet Orders (From admission, onward)     Start     Ordered   11/12/23 0000  Diet - low sodium heart healthy        11/12/23 1638           Diet: Heart healthy DISCHARGE MEDICATION:  Sent to [facility] [address] Allergies as of 11/12/2023   No Known Allergies      Medication List     TAKE these medications    albuterol 108 (90 Base) MCG/ACT inhaler Commonly known as: VENTOLIN HFA USE 1 TO 2 INHALATIONS EVERY 6 HOURS AS NEEDED FOR WHEEZING OR SHORTNESS OF BREATH   amoxicillin 500 MG tablet Commonly known as: AMOXIL Take 1 tablet (500 mg total) by mouth 2 (two) times daily for 3 days.   azithromycin 500 MG tablet Commonly known as: Zithromax Take 1 tablet (500 mg total) by mouth daily for 3 days.   clopidogrel 75 MG tablet Commonly known as: PLAVIX Take 1 tablet (75 mg total) by mouth daily with  breakfast.   empagliflozin 25 MG Tabs tablet Commonly known as: Jardiance Take 1 tablet (25 mg total) by mouth daily before breakfast. For diabetes E11.65 and E 11.29   Entresto 24-26 MG Generic drug: sacubitril-valsartan Take 1 tablet by mouth 2 (two) times daily.   FreeStyle Freedom Lite w/Device Kit Use Freestyle Freedom Lite meter to check blood sugar twice daily. DX:E11.65   FREESTYLE LITE test strip Generic drug: glucose blood USE AS INSTRUCTED   furosemide 40 MG tablet Commonly known as: LASIX Take 1 tablet (40 mg total) by mouth daily.   gabapentin 100 MG capsule Commonly known as: NEURONTIN Take 100 mg by mouth 2 (two) times daily.   hydrOXYzine 25 MG tablet Commonly known as: ATARAX Take 1 tablet by mouth at bedtime as needed.   metFORMIN 500 MG tablet Commonly known as: GLUCOPHAGE Take 1 tablet (500 mg total) by mouth 2 (two) times daily with a meal.   metoprolol 200 MG 24 hr tablet Commonly known as: TOPROL-XL  Take 100 mg by mouth daily.   multivitamin with minerals Tabs tablet Take 1 tablet by mouth daily.   ondansetron 8 MG tablet Commonly known as: Zofran Take 1 tablet (8 mg total) by mouth every 8 (eight) hours as needed for nausea or vomiting. Start on the third day after chemotherapy.   OXYGEN Inhale 2 L/min into the lungs at bedtime. PRN during day   prochlorperazine 10 MG tablet Commonly known as: COMPAZINE Take 1 tablet (10 mg total) by mouth every 6 (six) hours as needed for nausea or vomiting.   simvastatin 20 MG tablet Commonly known as: ZOCOR Take 20 mg by mouth daily.   Stiolto Respimat 2.5-2.5 MCG/ACT Aers Generic drug: Tiotropium Bromide-Olodaterol Inhale 2 puffs into the lungs daily.   tamsulosin 0.4 MG Caps capsule Commonly known as: FLOMAX Take 1 capsule (0.4 mg total) by mouth daily after supper.   Trulicity 3 MG/0.5ML Soaj Generic drug: Dulaglutide INJECT 3 MG UNDER THE SKIN ONCE A WEEK        Discharge  Exam: Filed Weights   11/11/23 1603  Weight: 97.7 kg   General: Pleasant, well-appearing elderly man laying in bed. No acute distress.  Very comfortable appearing. HEENT: [place]/AT. Anicteric sclera CV: RRR. No murmurs, rubs, or gallops. No LE edema Pulmonary: Lungs CTAB. Normal effort. No wheezing or rales.  Lungs sound great today on exam. Abdominal: Soft, nontender, nondistended. Normal bowel sounds. Extremities: Palpable radial and DP pulses. Normal ROM. Skin: Warm and dry. No obvious rash or lesions. Neuro: A&Ox3. Moves all extremities. Normal sensation to light touch. No focal deficit.  I got him up and we walked around the room.  He had no dizziness or ataxia.  Ambulated very normally around the room. Psych: Normal mood and affect  Condition at discharge: Good  The results of significant diagnostics from this hospitalization (including imaging, microbiology, ancillary and laboratory) are listed below for reference.   Imaging Studies: CT Angio Chest PE W/Cm &/Or Wo Cm Result Date: 11/11/2023 CLINICAL DATA:  Pulmonary embolus suspected with high probability. EXAM: CT ANGIOGRAPHY CHEST WITH CONTRAST TECHNIQUE: Multidetector CT imaging of the chest was performed using the standard protocol during bolus administration of intravenous contrast. Multiplanar CT image reconstructions and MIPs were obtained to evaluate the vascular anatomy. RADIATION DOSE REDUCTION: This exam was performed according to the departmental dose-optimization program which includes automated exposure control, adjustment of the mA and/or kV according to patient size and/or use of iterative reconstruction technique. CONTRAST:  75mL OMNIPAQUE IOHEXOL 350 MG/ML SOLN COMPARISON:  Chest radiograph 11/11/2023. PET-CT 10/25/2023. CT chest 09/19/2023 FINDINGS: Cardiovascular: Technically adequate study with good opacification of the central and segmental pulmonary arteries. Mild motion artifact. No focal filling defects are  identified. No evidence of significant pulmonary embolus. Slow flow demonstrated in the left lingular pulmonary artery corresponding to likely extrinsic compression related to known left lung mass lesion. Mild cardiac enlargement. Trace pericardial effusion. Normal caliber thoracic aorta with calcification. Mediastinum/Nodes: Thyroid gland is unremarkable. Esophagus is decompressed. Mediastinal lymph nodes are present with largest left aortopulmonic window nodes measuring about 12 mm short axis dimension. Lymph nodes are similar to prior study and demonstrated activity on recent PET-CT scan suggesting metastatic involvement. Lungs/Pleura: Left perihilar mass lesion measuring about 4.6 x 5.4 cm in diameter, mildly enlarged since prior study. There is also increased postobstructive pneumonia in the left lingula which may account for some of the change in size. Progression is not excluded. Prominent emphysematous changes throughout the lungs. Atelectasis in  the lung bases. Upper Abdomen: No acute abnormalities.  No adrenal gland nodules. Musculoskeletal: Degenerative changes in the spine. No metastatic lesions identified. Review of the MIP images confirms the above findings. IMPRESSION: 1. 5.4 cm diameter left perihilar mass lesion corresponding to known bronchogenic carcinoma and suggesting enlargement since prior study. 2. Increasing postobstructive pneumonia in the left lingula. 3. No evidence of significant pulmonary embolus. There is evidence of slow flow in the left lingular pulmonary artery which likely results from extrinsic compression due to the mass. 4. Severe emphysematous changes in the lungs. 5. Aortic atherosclerosis. 6. Mediastinal lymphadenopathy is unchanged since prior studies but likely metastatic. Electronically Signed   By: Burman Nieves M.D.   On: 11/11/2023 18:39   DG Chest Port 1 View Result Date: 11/11/2023 CLINICAL DATA:  Weakness. EXAM: PORTABLE CHEST 1 VIEW COMPARISON:  10/01/2023.  FINDINGS: Redemonstration of patient's known left hilar mass, better evaluated on the recent PET-CT scan from 10/25/2023. No significant interval change. There are additional opacities in the left lung which may represent postobstructive pneumonia. Correlate clinically. Bilateral lung fields are otherwise clear. Bilateral costophrenic angles are clear. Stable cardio-mediastinal silhouette. No acute osseous abnormalities. The soft tissues are within normal limits. IMPRESSION: Redemonstration of patient's known left hilar mass, better evaluated on the recent PET-CT scan from 10/25/2023. There are additional opacities in the left lung which may represent postobstructive pneumonia. Electronically Signed   By: Jules Schick M.D.   On: 11/11/2023 15:47   NM PET Image Initial (PI) Skull Base To Thigh Result Date: 10/29/2023 CLINICAL DATA:  Initial treatment strategy for lung mass. EXAM: NUCLEAR MEDICINE PET SKULL BASE TO THIGH TECHNIQUE: 10.7 mCi F-18 FDG was injected intravenously. Full-ring PET imaging was performed from the skull base to thigh after the radiotracer. CT data was obtained and used for attenuation correction and anatomic localization. Fasting blood glucose: 95 mg/dl COMPARISON:  09/81/1914 FINDINGS: Mediastinal blood pool activity: SUV max 2.36 Liver activity: SUV max NA NECK: No hypermetabolic lymph nodes in the neck. Incidental CT findings: None. CHEST: Spiculated, perihilar mass within the left upper lobe abuts the mediastinal pleural surface measuring 4.8 x 4.1 cm with SUV max 47.05, image 80/4. Signs of postobstructive pneumonitis noted within the periphery of the anterolateral left upper lobe, image 30/7. There is no additional tracer avid pulmonary nodule or mass identified bilaterally. Tracer avid ipsilateral mediastinal lymph nodes are identified. Index pre-vascular lymph node adjacent to the main pulmonary artery measures 1.2 cm with SUV max of 5.12. Anterior mediastinal lymph node adjacent  to the anterior arch of the aorta measures 0.8 cm with SUV max of 3.38, image 71/4. Mild tracer uptake within the left hilum has an SUV max 3.91, image 81 of the fused PET-CT images. No tracer avid contralateral mediastinal or hilar lymph nodes. Incidental CT findings: Emphysema. Too small to characterize nodule within the right upper lobe measures 5 mm, image 34/7. Aortic atherosclerosis. Coronary artery calcifications. Trace pericardial effusion. ABDOMEN/PELVIS: No abnormal tracer uptake identified within the liver, pancreas, or spleen. Left adrenal nodule measures 1.3 cm with SUV max of 4.05. no tracer avid abdominopelvic lymph nodes. Incidental CT findings: Aortic atherosclerosis. Left kidney cyst measures 11.5 x 10.0 cm, image 140/4. This contains a single thin hairline area of septation with tiny calcification, image 139/4 and image 143/4. SKELETON: No focal hypermetabolic activity to suggest skeletal metastasis. Incidental CT findings: None. IMPRESSION: 1. The spiculated, perihilar mass within the left upper lobe is intensely FDG avid compatible with primary bronchogenic  carcinoma. Mass is noted to abut the mediastinal pleura. 2. Signs of postobstructive pneumonitis within the periphery of the anterolateral left upper lobe. 3. Tracer avid ipsilateral mediastinal and left hilar lymph nodes are identified compatible with nodal metastasis. 4. Left adrenal nodule is mildly FDG avid and is indeterminate. Cannot exclude adrenal metastasis. 5. Too small to characterize nodule within the right upper lobe measures 5 mm. 6. Coronary artery calcifications. 7. Aortic Atherosclerosis (ICD10-I70.0) and Emphysema (ICD10-J43.9). Electronically Signed   By: Signa Kell M.D.   On: 10/29/2023 14:17   MR BRAIN W WO CONTRAST Result Date: 10/28/2023 CLINICAL DATA:  Non-small cell lung cancer, staging EXAM: MRI HEAD WITHOUT AND WITH CONTRAST TECHNIQUE: Multiplanar, multiecho pulse sequences of the brain and surrounding  structures were obtained without and with intravenous contrast. CONTRAST:  9mL GADAVIST GADOBUTROL 1 MMOL/ML IV SOLN COMPARISON:  No prior MRI available, correlation is made with CT head 02/08/2020 FINDINGS: Brain: No restricted diffusion to suggest acute or subacute infarct. No abnormal parenchymal or meningeal enhancement. No acute hemorrhage, mass, mass effect, or midline shift. No hydrocephalus or extra-axial collection. Pituitary and craniocervical junction within normal limits. No hemosiderin deposition to suggest remote hemorrhage. Normal cerebral volume for age. Dilated perivascular spaces in the basal ganglia. Remote lacunar infarcts in the left basal ganglia and bilateral periventricular white matter. T2 hyperintense signal in the periventricular white matter, likely the sequela of mild-to-moderate chronic small vessel ischemic disease. Vascular: Normal arterial flow voids. Normal arterial and venous enhancement. Skull and upper cervical spine: Normal marrow signal. Sinuses/Orbits: Mucosal thickening in the ethmoid air cells. Status post bilateral lens replacements. Other: The mastoid air cells are well aerated. IMPRESSION: No acute intracranial process. No evidence of intracranial metastatic disease. Electronically Signed   By: Wiliam Ke M.D.   On: 10/28/2023 11:25    Microbiology: Results for orders placed or performed during the hospital encounter of 11/11/23  Resp panel by RT-PCR (RSV, Flu A&B, Covid) Anterior Nasal Swab     Status: None   Collection Time: 11/11/23  1:44 PM   Specimen: Anterior Nasal Swab  Result Value Ref Range Status   SARS Coronavirus 2 by RT PCR NEGATIVE NEGATIVE Final    Comment: (NOTE) SARS-CoV-2 target nucleic acids are NOT DETECTED.  The SARS-CoV-2 RNA is generally detectable in upper respiratory specimens during the acute phase of infection. The lowest concentration of SARS-CoV-2 viral copies this assay can detect is 138 copies/mL. A negative result does  not preclude SARS-Cov-2 infection and should not be used as the sole basis for treatment or other patient management decisions. A negative result may occur with  improper specimen collection/handling, submission of specimen other than nasopharyngeal swab, presence of viral mutation(s) within the areas targeted by this assay, and inadequate number of viral copies(<138 copies/mL). A negative result must be combined with clinical observations, patient history, and epidemiological information. The expected result is Negative.  Fact Sheet for Patients:  BloggerCourse.com  Fact Sheet for Healthcare Providers:  SeriousBroker.it  This test is no t yet approved or cleared by the [place] FDA and  has been authorized for detection and/or diagnosis of SARS-CoV-2 by FDA under an Emergency Use Authorization (EUA). This EUA will remain  in effect (meaning this test can be used) for the duration of the COVID-19 declaration under Section 564(b)(1) of the Act, 21 U.S.C.section 360bbb-3(b)(1), unless the authorization is terminated  or revoked sooner.       Influenza A by PCR NEGATIVE NEGATIVE Final  Influenza B by PCR NEGATIVE NEGATIVE Final    Comment: (NOTE) The Xpert Xpress SARS-CoV-2/FLU/RSV plus assay is intended as an aid in the diagnosis of influenza from Nasopharyngeal swab specimens and should not be used as a sole basis for treatment. Nasal washings and aspirates are unacceptable for Xpert Xpress SARS-CoV-2/FLU/RSV testing.  Fact Sheet for Patients: BloggerCourse.com  Fact Sheet for Healthcare Providers: SeriousBroker.it  This test is not yet approved or cleared by the [place] FDA and has been authorized for detection and/or diagnosis of SARS-CoV-2 by FDA under an Emergency Use Authorization (EUA). This EUA will remain in effect (meaning this test can be used) for the  duration of the COVID-19 declaration under Section 564(b)(1) of the Act, 21 U.S.C. section 360bbb-3(b)(1), unless the authorization is terminated or revoked.     Resp Syncytial Virus by PCR NEGATIVE NEGATIVE Final    Comment: (NOTE) Fact Sheet for Patients: BloggerCourse.com  Fact Sheet for Healthcare Providers: SeriousBroker.it  This test is not yet approved or cleared by the [place] FDA and has been authorized for detection and/or diagnosis of SARS-CoV-2 by FDA under an Emergency Use Authorization (EUA). This EUA will remain in effect (meaning this test can be used) for the duration of the COVID-19 declaration under Section 564(b)(1) of the Act, 21 U.S.C. section 360bbb-3(b)(1), unless the authorization is terminated or revoked.  Performed at [facility], [address], [place], [place] [address]   Respiratory (~20 pathogens) panel by PCR     Status: None   Collection Time: 11/11/23  1:44 PM   Specimen: Nasopharyngeal Swab; Respiratory  Result Value Ref Range Status   Adenovirus NOT DETECTED NOT DETECTED Final   Coronavirus 229E NOT DETECTED NOT DETECTED Final    Comment: (NOTE) The Coronavirus on the Respiratory Panel, DOES NOT test for the novel  Coronavirus (2019 nCoV)    Coronavirus HKU1 NOT DETECTED NOT DETECTED Final   Coronavirus NL63 NOT DETECTED NOT DETECTED Final   Coronavirus OC43 NOT DETECTED NOT DETECTED Final   Metapneumovirus NOT DETECTED NOT DETECTED Final   Rhinovirus / Enterovirus NOT DETECTED NOT DETECTED Final   Influenza A NOT DETECTED NOT DETECTED Final   Influenza B NOT DETECTED NOT DETECTED Final   Parainfluenza Virus 1 NOT DETECTED NOT DETECTED Final   Parainfluenza Virus 2 NOT DETECTED NOT DETECTED Final   Parainfluenza Virus 3 NOT DETECTED NOT DETECTED Final   Parainfluenza Virus 4 NOT DETECTED NOT DETECTED Final   Respiratory Syncytial Virus NOT DETECTED NOT DETECTED  Final   Bordetella pertussis NOT DETECTED NOT DETECTED Final   Bordetella Parapertussis NOT DETECTED NOT DETECTED Final   Chlamydophila pneumoniae NOT DETECTED NOT DETECTED Final   Mycoplasma pneumoniae NOT DETECTED NOT DETECTED Final    Comment: Performed at [facility], [address] [place], [place] [address]   *Note: Due to a large number of results and/or encounters for the requested time period, some results have not been displayed. A complete set of results can be found in Results Review.    Labs: CBC: Recent Labs  Lab 11/11/23 1325 11/12/23 0420  WBC 14.7* 9.6  HGB 14.9 12.6*  HCT 47.4 39.9  MCV 100.9* 100.8*  PLT 384 324   Basic Metabolic Panel: Recent Labs  Lab 11/11/23 1325 11/12/23 0420  NA 142 140  K 3.5 3.3*  CL 105 106  CO2 27 28  GLUCOSE 155* 123*  BUN 16 17  CREATININE 1.48* 1.28*  CALCIUM 9.0 8.4*  Liver Function Tests: Recent Labs  Lab 11/11/23 1325  AST 15  ALT 8  ALKPHOS 56  BILITOT 0.9  PROT 7.2  ALBUMIN 3.3*   CBG: Recent Labs  Lab 11/11/23 2106 11/12/23 0815 11/12/23 1128  GLUCAP 106* 101* 131*    Discharge time spent: Less than 30 minutes.  Signed: Tobey Grim, MD [facility] 11/12/2023

## 2023-11-12 NOTE — ED Notes (Signed)
 Removed over shirt left on his t-shirt patient pulled up and repositioned in bed he is on 2L/min of oxygen at this time.

## 2023-11-13 ENCOUNTER — Inpatient Hospital Stay: Payer: Medicare Other

## 2023-11-13 ENCOUNTER — Ambulatory Visit
Admission: RE | Admit: 2023-11-13 | Discharge: 2023-11-13 | Disposition: A | Discharge status: Home / Self Care | Payer: Medicare Other | Source: Ambulatory Visit | Attending: Radiation Oncology | Admitting: Radiation Oncology

## 2023-11-13 ENCOUNTER — Other Ambulatory Visit: Payer: Self-pay

## 2023-11-13 DIAGNOSIS — Z87891 Personal history of nicotine dependence: Secondary | ICD-10-CM | POA: Diagnosis not present

## 2023-11-13 DIAGNOSIS — Z510 Encounter for antineoplastic radiation therapy: Secondary | ICD-10-CM | POA: Diagnosis not present

## 2023-11-13 DIAGNOSIS — C3412 Malignant neoplasm of upper lobe, left bronchus or lung: Secondary | ICD-10-CM | POA: Diagnosis not present

## 2023-11-13 LAB — RAD ONC ARIA SESSION SUMMARY
Course Elapsed Days: 1
Plan Fractions Treated to Date: 2
Plan Prescribed Dose Per Fraction: 2 Gy
Plan Total Fractions Prescribed: 30
Plan Total Prescribed Dose: 60 Gy
Reference Point Dosage Given to Date: 4 Gy
Reference Point Session Dosage Given: 2 Gy
Session Number: 2

## 2023-11-13 NOTE — Progress Notes (Signed)
 [facility] OFFICE PROGRESS NOTE  Wanda Plump, MD [address] [place] [address]  DIAGNOSIS: Stage IIIa (T3, N2, M0) non-small cell lung cancer, squamous cell carcinoma presented with left upper lobe lung mass abutting the pleura and ipsilateral mediastinal and left hilar lymph nodes. The left adrenal nodule is mildly FDG avid and indeterminate. Diagnosed in January 2025.   PRIOR THERAPY: None  CURRENT THERAPY: Concurrent chemoradiation with carboplatin for an AUC of 2 and paclitaxel 45 mg/m.  First dose expected tomorrow on 11/19/2023.   INTERVAL HISTORY: Jonathan [name]. 88 y.o. male returns to the clinic today for a follow-up visit. The patient establish care in the clinic with Dr. Arbutus Ped on 10/23/2023 for at least stage III non-small cell lung cancer, squamous cell carcinoma.  When the patient was seen Dr. Arbutus Ped at his consultation, he need to complete the staging workup with a PET scan and a brain MRI.  His brain MRI was negative for metastatic disease.  His PET scan showed perihilar mass within the left upper lobe and ipsilateral mediastinal and left hilar lymph nodes. The left adrenal nodule is indeterminate.   The plan is to undergo concurrent chemoradiation.  He started his radiation.  He was not able to start his chemotherapy last week as scheduled due to being in the emergency room on 11/11/2023 for the chief complaint of hypotension.  He also had associated generalized weakness and dizziness.  He was treated for postobstructive pneumonia with ceftriaxone and azithromycin.  He had improvement in his blood pressure with IV fluids. He completed his outpatient antibiotics.    He still has a persistent cough. He has not taken anything for cough and he is wondering what he can take. Denies any fever, chills, or night sweats. He reports a good appetite and he enjoys eating. He reports similar dyspnea on exertion. He wears supplemental oxygen at night and  during the day PRN. He had some blood tinged sputum last week when he was seen in the hospital but he states this is improving. He denies chest pain.  Denies any nausea, vomiting, diarrhea, or constipation.  Denies any headache or visual changes.  He is here today for evaluation repeat blood work before undergoing cycle #1 tomorrow. His wife has dementia and he does not have a caregiver in the mornings. He needs all of his appointments to be in the afternoon.     MEDICAL HISTORY: Past Medical History:  Diagnosis Date   CAD (coronary artery disease)    Two RCA stents remotely / 3rd RCA stent 2006   Colon polyps    s/p several Cscopes.   COPD (chronic obstructive pulmonary disease) (HCC)    on O2, nocturnal   Diabetes mellitus    dx aprox 2009   ED (erectile dysfunction)    has a vacumm device   Ejection fraction    Hyperlipidemia    dx in 90s   Hypertension    dx in the 51   Hypogonadism male    PVD (peripheral vascular disease) (HCC)    s/p stents at LE 2009, Dr Jacinto Halim   Shortness of breath    O2 Sat dropped to 82% walking on the treadmill, September, 2012    ALLERGIES:  has no known allergies.  MEDICATIONS:  Current Outpatient Medications  Medication Sig Dispense Refill   albuterol (VENTOLIN HFA) 108 (90 Base) MCG/ACT inhaler USE 1 TO 2 INHALATIONS EVERY 6 HOURS AS NEEDED FOR WHEEZING  OR SHORTNESS OF BREATH 34 g 3   Blood Glucose Monitoring Suppl (FREESTYLE FREEDOM LITE) w/Device KIT Use Freestyle Freedom Lite meter to check blood sugar twice daily. DX:E11.65 1 kit 0   clopidogrel (PLAVIX) 75 MG tablet Take 1 tablet (75 mg total) by mouth daily with breakfast. 90 tablet 1   Dulaglutide (TRULICITY) 3 MG/0.5ML SOAJ INJECT 3 MG UNDER THE SKIN ONCE A WEEK (Patient not taking: Reported on 11/11/2023) 6 mL 3   empagliflozin (JARDIANCE) 25 MG TABS tablet Take 1 tablet (25 mg total) by mouth daily before breakfast. For diabetes E11.65 and E 11.29 90 tablet 3   furosemide (LASIX) 40 MG  tablet Take 1 tablet (40 mg total) by mouth daily. 90 tablet 1   gabapentin (NEURONTIN) 100 MG capsule Take 100 mg by mouth 2 (two) times daily.     glucose blood (FREESTYLE LITE) test strip USE AS INSTRUCTED 100 strip 11   hydrOXYzine (ATARAX) 25 MG tablet Take 1 tablet by mouth at bedtime as needed.     metFORMIN (GLUCOPHAGE) 500 MG tablet Take 1 tablet (500 mg total) by mouth 2 (two) times daily with a meal. (Patient not taking: Reported on 11/11/2023) 180 tablet 3   metoprolol (TOPROL-XL) 200 MG 24 hr tablet Take 100 mg by mouth daily.     Multiple Vitamin (MULTIVITAMIN WITH MINERALS) TABS tablet Take 1 tablet by mouth daily.     ondansetron (ZOFRAN) 8 MG tablet Take 1 tablet (8 mg total) by mouth every 8 (eight) hours as needed for nausea or vomiting. Start on the third day after chemotherapy. 30 tablet 1   OXYGEN Inhale 2 L/min into the lungs at bedtime. PRN during day     prochlorperazine (COMPAZINE) 10 MG tablet Take 1 tablet (10 mg total) by mouth every 6 (six) hours as needed for nausea or vomiting. 30 tablet 1   sacubitril-valsartan (ENTRESTO) 24-26 MG Take 1 tablet by mouth 2 (two) times daily. 60 tablet 0   simvastatin (ZOCOR) 20 MG tablet Take 20 mg by mouth daily.     tamsulosin (FLOMAX) 0.4 MG CAPS capsule Take 1 capsule (0.4 mg total) by mouth daily after supper. 90 capsule 1   Tiotropium Bromide-Olodaterol (STIOLTO RESPIMAT) 2.5-2.5 MCG/ACT AERS Inhale 2 puffs into the lungs daily. (Patient not taking: Reported on 11/11/2023) 12 g 4   No current facility-administered medications for this visit.    SURGICAL HISTORY:  Past Surgical History:  Procedure Laterality Date   APPENDECTOMY     BRONCHIAL BIOPSY  10/01/2023   Procedure: BRONCHIAL BIOPSIES;  Surgeon: Josephine Igo, DO;  Location: [facility];  Service: Pulmonary;;   BRONCHIAL BRUSHINGS  10/01/2023   Procedure: BRONCHIAL BRUSHINGS;  Surgeon: Josephine Igo, DO;  Location: [facility];  Service: Pulmonary;;    BRONCHIAL WASHINGS  10/01/2023   Procedure: BRONCHIAL WASHINGS;  Surgeon: Josephine Igo, DO;  Location: [facility];  Service: Pulmonary;;   CORONARY STENT INTERVENTION N/A 01/12/2022   Procedure: CORONARY STENT INTERVENTION;  Surgeon: Swaziland, Peter M, MD;  Location: [facility] CV [facility];  Service: Cardiovascular;  Laterality: N/A;   HEMOSTASIS CONTROL  10/01/2023   Procedure: HEMOSTASIS CONTROL;  Surgeon: Josephine Igo, DO;  Location: [facility];  Service: Pulmonary;;   LEFT HEART CATH AND CORONARY ANGIOGRAPHY N/A 03/13/2023   Procedure: LEFT HEART CATH AND CORONARY ANGIOGRAPHY;  Surgeon: Swaziland, Peter M, MD;  Location: [facility] INVASIVE CV LAB;  Service: Cardiovascular;  Laterality: N/A;   RIGHT/LEFT HEART CATH AND CORONARY  ANGIOGRAPHY N/A 01/11/2022   Procedure: RIGHT/LEFT HEART CATH AND CORONARY ANGIOGRAPHY;  Surgeon: Lyn Records, MD;  Location: [facility] INVASIVE CV LAB;  Service: Cardiovascular;  Laterality: N/A;   TONSILLECTOMY     VIDEO BRONCHOSCOPY  10/01/2023   Procedure: VIDEO BRONCHOSCOPY WITHOUT FLUORO;  Surgeon: Josephine Igo, DO;  Location: [facility];  Service: Pulmonary;;    REVIEW OF SYSTEMS:   Review of Systems  Constitutional: Negative for appetite change, chills, fatigue, fever and unexpected weight change.  HENT: Negative for mouth sores, nosebleeds, sore throat and trouble swallowing.   Eyes: Negative for eye problems and icterus.  Respiratory: Positive for cough and stable dyspnea on exertion. Negative for  hemoptysis and wheezing.   Cardiovascular: Negative for chest pain and leg swelling.  Gastrointestinal: Negative for abdominal pain, constipation, diarrhea, nausea and vomiting.  Genitourinary: Negative for bladder incontinence, difficulty urinating, dysuria, frequency and hematuria.   Musculoskeletal: Negative for back pain, gait problem, neck pain and neck stiffness.  Skin: Negative for itching and rash.  Neurological: Negative for dizziness, extremity weakness,  gait problem, headaches, light-headedness and seizures.  Hematological: Negative for adenopathy. Does not bruise/bleed easily.  Psychiatric/Behavioral: Negative for confusion, depression and sleep disturbance. The patient is not nervous/anxious.     PHYSICAL EXAMINATION:  Blood pressure (!) 166/97, pulse (!) 102, temperature 99.9 F (37.7 C), temperature source Temporal, resp. rate 18, weight 223 lb 1.6 oz (101.2 kg), SpO2 91%.  ECOG PERFORMANCE STATUS: 1  Physical Exam  Constitutional: Oriented to person, place, and time and well-developed, well-nourished, and in no distress.  HENT:  Head: Normocephalic and atraumatic.  Mouth/Throat: Oropharynx is clear and moist. No oropharyngeal exudate.  Eyes: Conjunctivae are normal. Right eye exhibits no discharge. Left eye exhibits no discharge. No scleral icterus.  Neck: Normal range of motion. Neck supple.  Cardiovascular: Normal rate, regular rhythm, normal heart sounds and intact distal pulses.   Pulmonary/Chest: Effort normal and breath sounds normal. No respiratory distress. No wheezes. No rales.  Abdominal: Soft. Bowel sounds are normal. Exhibits no distension and no mass. There is no tenderness.  Musculoskeletal: Normal range of motion. Exhibits no edema.  Lymphadenopathy:    No cervical adenopathy.  Neurological: Alert and oriented to person, place, and time. Exhibits normal muscle tone. Gait normal. Coordination normal.  Skin: Skin is warm and dry. No rash noted. Not diaphoretic. No erythema. No pallor.  Psychiatric: Mood, memory and judgment normal.  Vitals reviewed.  LABORATORY DATA: Lab Results  Component Value Date   WBC 13.5 (H) 11/18/2023   HGB 14.0 11/18/2023   HCT 42.5 11/18/2023   MCV 97.3 11/18/2023   PLT 330 11/18/2023      Chemistry      Component Value Date/Time   NA 138 11/18/2023 1351   NA 144 03/05/2023 0955   K 3.8 11/18/2023 1351   CL 104 11/18/2023 1351   CO2 27 11/18/2023 1351   BUN 13 11/18/2023  1351   BUN 17 03/05/2023 0955   CREATININE 1.09 11/18/2023 1351   CREATININE 1.29 (H) 09/27/2023 1225      Component Value Date/Time   CALCIUM 9.0 11/18/2023 1351   ALKPHOS 58 11/18/2023 1351   AST 13 (L) 11/18/2023 1351   ALT 8 11/18/2023 1351   BILITOT 0.8 11/18/2023 1351       RADIOGRAPHIC STUDIES:  CT Angio Chest PE W/Cm &/Or Wo Cm Result Date: 11/11/2023 CLINICAL DATA:  Pulmonary embolus suspected with high probability. EXAM: CT ANGIOGRAPHY CHEST WITH CONTRAST TECHNIQUE:  Multidetector CT imaging of the chest was performed using the standard protocol during bolus administration of intravenous contrast. Multiplanar CT image reconstructions and MIPs were obtained to evaluate the vascular anatomy. RADIATION DOSE REDUCTION: This exam was performed according to the departmental dose-optimization program which includes automated exposure control, adjustment of the mA and/or kV according to patient size and/or use of iterative reconstruction technique. CONTRAST:  75mL OMNIPAQUE IOHEXOL 350 MG/ML SOLN COMPARISON:  Chest radiograph 11/11/2023. PET-CT 10/25/2023. CT chest 09/19/2023 FINDINGS: Cardiovascular: Technically adequate study with good opacification of the central and segmental pulmonary arteries. Mild motion artifact. No focal filling defects are identified. No evidence of significant pulmonary embolus. Slow flow demonstrated in the left lingular pulmonary artery corresponding to likely extrinsic compression related to known left lung mass lesion. Mild cardiac enlargement. Trace pericardial effusion. Normal caliber thoracic aorta with calcification. Mediastinum/Nodes: Thyroid gland is unremarkable. Esophagus is decompressed. Mediastinal lymph nodes are present with largest left aortopulmonic window nodes measuring about 12 mm short axis dimension. Lymph nodes are similar to prior study and demonstrated activity on recent PET-CT scan suggesting metastatic involvement. Lungs/Pleura: Left  perihilar mass lesion measuring about 4.6 x 5.4 cm in diameter, mildly enlarged since prior study. There is also increased postobstructive pneumonia in the left lingula which may account for some of the change in size. Progression is not excluded. Prominent emphysematous changes throughout the lungs. Atelectasis in the lung bases. Upper Abdomen: No acute abnormalities.  No adrenal gland nodules. Musculoskeletal: Degenerative changes in the spine. No metastatic lesions identified. Review of the MIP images confirms the above findings. IMPRESSION: 1. 5.4 cm diameter left perihilar mass lesion corresponding to known bronchogenic carcinoma and suggesting enlargement since prior study. 2. Increasing postobstructive pneumonia in the left lingula. 3. No evidence of significant pulmonary embolus. There is evidence of slow flow in the left lingular pulmonary artery which likely results from extrinsic compression due to the mass. 4. Severe emphysematous changes in the lungs. 5. Aortic atherosclerosis. 6. Mediastinal lymphadenopathy is unchanged since prior studies but likely metastatic. Electronically Signed   By: Burman Nieves M.D.   On: 11/11/2023 18:39   DG Chest Port 1 View Result Date: 11/11/2023 CLINICAL DATA:  Weakness. EXAM: PORTABLE CHEST 1 VIEW COMPARISON:  10/01/2023. FINDINGS: Redemonstration of patient's known left hilar mass, better evaluated on the recent PET-CT scan from 10/25/2023. No significant interval change. There are additional opacities in the left lung which may represent postobstructive pneumonia. Correlate clinically. Bilateral lung fields are otherwise clear. Bilateral costophrenic angles are clear. Stable cardio-mediastinal silhouette. No acute osseous abnormalities. The soft tissues are within normal limits. IMPRESSION: Redemonstration of patient's known left hilar mass, better evaluated on the recent PET-CT scan from 10/25/2023. There are additional opacities in the left lung which may  represent postobstructive pneumonia. Electronically Signed   By: Jules Schick M.D.   On: 11/11/2023 15:47   NM PET Image Initial (PI) Skull Base To Thigh Result Date: 10/29/2023 CLINICAL DATA:  Initial treatment strategy for lung mass. EXAM: NUCLEAR MEDICINE PET SKULL BASE TO THIGH TECHNIQUE: 10.7 mCi F-18 FDG was injected intravenously. Full-ring PET imaging was performed from the skull base to thigh after the radiotracer. CT data was obtained and used for attenuation correction and anatomic localization. Fasting blood glucose: 95 mg/dl COMPARISON:  16/06/9603 FINDINGS: Mediastinal blood pool activity: SUV max 2.36 Liver activity: SUV max NA NECK: No hypermetabolic lymph nodes in the neck. Incidental CT findings: None. CHEST: Spiculated, perihilar mass within the left upper lobe abuts  the mediastinal pleural surface measuring 4.8 x 4.1 cm with SUV max 47.05, image 80/4. Signs of postobstructive pneumonitis noted within the periphery of the anterolateral left upper lobe, image 30/7. There is no additional tracer avid pulmonary nodule or mass identified bilaterally. Tracer avid ipsilateral mediastinal lymph nodes are identified. Index pre-vascular lymph node adjacent to the main pulmonary artery measures 1.2 cm with SUV max of 5.12. Anterior mediastinal lymph node adjacent to the anterior arch of the aorta measures 0.8 cm with SUV max of 3.38, image 71/4. Mild tracer uptake within the left hilum has an SUV max 3.91, image 81 of the fused PET-CT images. No tracer avid contralateral mediastinal or hilar lymph nodes. Incidental CT findings: Emphysema. Too small to characterize nodule within the right upper lobe measures 5 mm, image 34/7. Aortic atherosclerosis. Coronary artery calcifications. Trace pericardial effusion. ABDOMEN/PELVIS: No abnormal tracer uptake identified within the liver, pancreas, or spleen. Left adrenal nodule measures 1.3 cm with SUV max of 4.05. no tracer avid abdominopelvic lymph nodes.  Incidental CT findings: Aortic atherosclerosis. Left kidney cyst measures 11.5 x 10.0 cm, image 140/4. This contains a single thin hairline area of septation with tiny calcification, image 139/4 and image 143/4. SKELETON: No focal hypermetabolic activity to suggest skeletal metastasis. Incidental CT findings: None. IMPRESSION: 1. The spiculated, perihilar mass within the left upper lobe is intensely FDG avid compatible with primary bronchogenic carcinoma. Mass is noted to abut the mediastinal pleura. 2. Signs of postobstructive pneumonitis within the periphery of the anterolateral left upper lobe. 3. Tracer avid ipsilateral mediastinal and left hilar lymph nodes are identified compatible with nodal metastasis. 4. Left adrenal nodule is mildly FDG avid and is indeterminate. Cannot exclude adrenal metastasis. 5. Too small to characterize nodule within the right upper lobe measures 5 mm. 6. Coronary artery calcifications. 7. Aortic Atherosclerosis (ICD10-I70.0) and Emphysema (ICD10-J43.9). Electronically Signed   By: Signa Kell M.D.   On: 10/29/2023 14:17   MR BRAIN W WO CONTRAST Result Date: 10/28/2023 CLINICAL DATA:  Non-small cell lung cancer, staging EXAM: MRI HEAD WITHOUT AND WITH CONTRAST TECHNIQUE: Multiplanar, multiecho pulse sequences of the brain and surrounding structures were obtained without and with intravenous contrast. CONTRAST:  9mL GADAVIST GADOBUTROL 1 MMOL/ML IV SOLN COMPARISON:  No prior MRI available, correlation is made with CT head 02/08/2020 FINDINGS: Brain: No restricted diffusion to suggest acute or subacute infarct. No abnormal parenchymal or meningeal enhancement. No acute hemorrhage, mass, mass effect, or midline shift. No hydrocephalus or extra-axial collection. Pituitary and craniocervical junction within normal limits. No hemosiderin deposition to suggest remote hemorrhage. Normal cerebral volume for age. Dilated perivascular spaces in the basal ganglia. Remote lacunar infarcts  in the left basal ganglia and bilateral periventricular white matter. T2 hyperintense signal in the periventricular white matter, likely the sequela of mild-to-moderate chronic small vessel ischemic disease. Vascular: Normal arterial flow voids. Normal arterial and venous enhancement. Skull and upper cervical spine: Normal marrow signal. Sinuses/Orbits: Mucosal thickening in the ethmoid air cells. Status post bilateral lens replacements. Other: The mastoid air cells are well aerated. IMPRESSION: No acute intracranial process. No evidence of intracranial metastatic disease. Electronically Signed   By: Wiliam Ke M.D.   On: 10/28/2023 11:25     ASSESSMENT/PLAN:  This is a very pleasant 88 year old African-American male recently diagnosed with Stage IIIa (T3, N2, M0) non-small cell lung cancer, squamous cell carcinoma presented with left upper lobe lung mass abutting the pleura and ipsilateral mediastinal and left hilar lymph nodes.  The left adrenal nodule is mildly FDG avid and indeterminate. Diagnosed in January 2025.   The patient was seen with Dr. Arbutus Ped.  Dr. Arbutus Ped personally and independently reviewed the scan results and discussed results with the patient today.    The plan is to start concurrent chemoradiation.  The patient started radiation last week.  He will start chemotherapy tomorrow with carboplatin for an AUC of 2 and paclitaxel 45 mg/m.  Labs were reviewed.  Recommend he proceed with cycle #1 tomorrow as scheduled.  We will see him back for follow-up visit in 2 weeks for evaluation repeat blood work before undergoing cycle #3.  He can only come in the afternoons due to his wife having dementia and not having a caregiver in the mornings.   He completed his antibiotics for his post-obstructive pneumonia. He was advised to use robitussin or delsym for cough. Due to his age, I would avoid narcotic cough suppressant at this time. Can consider tessalon if no improvement with either  delsym or robitussin.   We will monitor the indeterminate left adrenal nodule on future imaging studies.   The patient was advised to call immediately if she has any concerning symptoms in the interval. The patient voices understanding of current disease status and treatment options and is in agreement with the current care plan. All questions were answered. The patient knows to call the clinic with any problems, questions or concerns. We can certainly see the patient much sooner if necessary  No orders of the defined types were placed in this encounter.     Jonathan [name] L Kendale Rembold, PA-C 11/18/23  ADDENDUM: Hematology/Oncology Attending: I had a face-to-face encounter with the patient today.  I reviewed his record, lab, scan and recommended his care plan.  This is a very pleasant 88 years old African-American male recently diagnosed with a stage IIIa non-small cell lung cancer, squamous cell carcinoma in January 2025.  The patient had a recent PET scan performed.  I reviewed the PET scan results with the patient which showed the spiculated perihilar mass within the left upper lobe compatible with the primary bronchogenic carcinoma and the mass is noted to abut the mediastinal pleura there was also signs of postobstructive pneumonitis within the periphery of the anterior lateral left upper lobe and the tracer avid ipsilateral and left hilar lymph nodes compatible with nodal metastasis.  There was mild FDG avidity in the left adrenal nodule that indeterminate and need close monitoring. I recommended for the patient to proceed with the first cycle of his concurrent chemoradiation with weekly carboplatin for AUC of 2 and paclitaxel 45 Mg/M2 tomorrow as planned. We will see him back for follow-up visit in 2 weeks for evaluation and management of any adverse effect of his treatment. The patient was advised to call immediately if she has any other concerning symptoms in the interval. The total time  spent in the appointment was 30 minutes. Disclaimer: This note was dictated with voice recognition software. Similar sounding words can inadvertently be transcribed and may be missed upon review. Lajuana Matte, MD

## 2023-11-14 ENCOUNTER — Other Ambulatory Visit: Payer: Self-pay

## 2023-11-14 ENCOUNTER — Ambulatory Visit
Admission: RE | Admit: 2023-11-14 | Discharge: 2023-11-14 | Disposition: A | Discharge status: Home / Self Care | Payer: Medicare Other | Source: Ambulatory Visit | Attending: Radiation Oncology | Admitting: Radiation Oncology

## 2023-11-14 DIAGNOSIS — C3412 Malignant neoplasm of upper lobe, left bronchus or lung: Secondary | ICD-10-CM | POA: Diagnosis not present

## 2023-11-14 DIAGNOSIS — Z87891 Personal history of nicotine dependence: Secondary | ICD-10-CM | POA: Diagnosis not present

## 2023-11-14 DIAGNOSIS — Z510 Encounter for antineoplastic radiation therapy: Secondary | ICD-10-CM | POA: Diagnosis not present

## 2023-11-14 LAB — RAD ONC ARIA SESSION SUMMARY
Course Elapsed Days: 2
Plan Fractions Treated to Date: 3
Plan Prescribed Dose Per Fraction: 2 Gy
Plan Total Fractions Prescribed: 30
Plan Total Prescribed Dose: 60 Gy
Reference Point Dosage Given to Date: 6 Gy
Reference Point Session Dosage Given: 2 Gy
Session Number: 3

## 2023-11-15 ENCOUNTER — Ambulatory Visit
Admission: RE | Admit: 2023-11-15 | Discharge: 2023-11-15 | Disposition: A | Discharge status: Home / Self Care | Payer: Medicare Other | Source: Ambulatory Visit | Attending: Radiation Oncology | Admitting: Radiation Oncology

## 2023-11-15 ENCOUNTER — Other Ambulatory Visit: Payer: Self-pay

## 2023-11-15 DIAGNOSIS — Z87891 Personal history of nicotine dependence: Secondary | ICD-10-CM | POA: Diagnosis not present

## 2023-11-15 DIAGNOSIS — C3412 Malignant neoplasm of upper lobe, left bronchus or lung: Secondary | ICD-10-CM

## 2023-11-15 DIAGNOSIS — Z510 Encounter for antineoplastic radiation therapy: Secondary | ICD-10-CM | POA: Diagnosis not present

## 2023-11-15 LAB — RAD ONC ARIA SESSION SUMMARY
Course Elapsed Days: 3
Plan Fractions Treated to Date: 4
Plan Prescribed Dose Per Fraction: 2 Gy
Plan Total Fractions Prescribed: 30
Plan Total Prescribed Dose: 60 Gy
Reference Point Dosage Given to Date: 8 Gy
Reference Point Session Dosage Given: 2 Gy
Session Number: 4

## 2023-11-15 LAB — LEGIONELLA PNEUMOPHILA SEROGP 1 UR AG: L. pneumophila Serogp 1 Ur Ag: NEGATIVE

## 2023-11-15 MED ORDER — SONAFINE EX EMUL
1.0000 | Freq: Once | CUTANEOUS | Status: AC
  Administered 2023-11-15: 1 via TOPICAL

## 2023-11-18 ENCOUNTER — Inpatient Hospital Stay: Payer: Medicare Other | Attending: Internal Medicine | Admitting: Physician Assistant

## 2023-11-18 ENCOUNTER — Other Ambulatory Visit: Payer: Self-pay

## 2023-11-18 ENCOUNTER — Inpatient Hospital Stay: Payer: Medicare Other

## 2023-11-18 ENCOUNTER — Other Ambulatory Visit: Payer: Medicare Other

## 2023-11-18 ENCOUNTER — Ambulatory Visit
Admission: RE | Admit: 2023-11-18 | Discharge: 2023-11-18 | Disposition: A | Discharge status: Home / Self Care | Payer: Medicare Other | Source: Ambulatory Visit | Attending: Radiation Oncology | Admitting: Radiation Oncology

## 2023-11-18 VITALS — BP 166/97 | HR 102 | Temp 99.9°F | Resp 18 | Wt 223.1 lb

## 2023-11-18 DIAGNOSIS — I3139 Other pericardial effusion (noninflammatory): Secondary | ICD-10-CM | POA: Insufficient documentation

## 2023-11-18 DIAGNOSIS — E1151 Type 2 diabetes mellitus with diabetic peripheral angiopathy without gangrene: Secondary | ICD-10-CM | POA: Insufficient documentation

## 2023-11-18 DIAGNOSIS — I739 Peripheral vascular disease, unspecified: Secondary | ICD-10-CM | POA: Insufficient documentation

## 2023-11-18 DIAGNOSIS — I959 Hypotension, unspecified: Secondary | ICD-10-CM | POA: Diagnosis not present

## 2023-11-18 DIAGNOSIS — Z5111 Encounter for antineoplastic chemotherapy: Secondary | ICD-10-CM | POA: Diagnosis not present

## 2023-11-18 DIAGNOSIS — Z860101 Personal history of adenomatous and serrated colon polyps: Secondary | ICD-10-CM | POA: Insufficient documentation

## 2023-11-18 DIAGNOSIS — I2510 Atherosclerotic heart disease of native coronary artery without angina pectoris: Secondary | ICD-10-CM | POA: Insufficient documentation

## 2023-11-18 DIAGNOSIS — Z7985 Long-term (current) use of injectable non-insulin antidiabetic drugs: Secondary | ICD-10-CM | POA: Insufficient documentation

## 2023-11-18 DIAGNOSIS — Z510 Encounter for antineoplastic radiation therapy: Secondary | ICD-10-CM | POA: Insufficient documentation

## 2023-11-18 DIAGNOSIS — J189 Pneumonia, unspecified organism: Secondary | ICD-10-CM | POA: Diagnosis not present

## 2023-11-18 DIAGNOSIS — E278 Other specified disorders of adrenal gland: Secondary | ICD-10-CM | POA: Diagnosis not present

## 2023-11-18 DIAGNOSIS — Z7902 Long term (current) use of antithrombotics/antiplatelets: Secondary | ICD-10-CM | POA: Insufficient documentation

## 2023-11-18 DIAGNOSIS — C3412 Malignant neoplasm of upper lobe, left bronchus or lung: Secondary | ICD-10-CM | POA: Insufficient documentation

## 2023-11-18 DIAGNOSIS — N281 Cyst of kidney, acquired: Secondary | ICD-10-CM | POA: Diagnosis not present

## 2023-11-18 DIAGNOSIS — E119 Type 2 diabetes mellitus without complications: Secondary | ICD-10-CM | POA: Insufficient documentation

## 2023-11-18 DIAGNOSIS — J449 Chronic obstructive pulmonary disease, unspecified: Secondary | ICD-10-CM | POA: Insufficient documentation

## 2023-11-18 DIAGNOSIS — D72819 Decreased white blood cell count, unspecified: Secondary | ICD-10-CM | POA: Insufficient documentation

## 2023-11-18 DIAGNOSIS — Z79899 Other long term (current) drug therapy: Secondary | ICD-10-CM | POA: Insufficient documentation

## 2023-11-18 DIAGNOSIS — Z87891 Personal history of nicotine dependence: Secondary | ICD-10-CM | POA: Diagnosis not present

## 2023-11-18 DIAGNOSIS — Z7984 Long term (current) use of oral hypoglycemic drugs: Secondary | ICD-10-CM | POA: Insufficient documentation

## 2023-11-18 DIAGNOSIS — I700 Atherosclerosis of aorta: Secondary | ICD-10-CM | POA: Diagnosis not present

## 2023-11-18 DIAGNOSIS — Z923 Personal history of irradiation: Secondary | ICD-10-CM | POA: Insufficient documentation

## 2023-11-18 DIAGNOSIS — R590 Localized enlarged lymph nodes: Secondary | ICD-10-CM | POA: Diagnosis not present

## 2023-11-18 DIAGNOSIS — Z9981 Dependence on supplemental oxygen: Secondary | ICD-10-CM | POA: Insufficient documentation

## 2023-11-18 DIAGNOSIS — E785 Hyperlipidemia, unspecified: Secondary | ICD-10-CM | POA: Insufficient documentation

## 2023-11-18 DIAGNOSIS — J439 Emphysema, unspecified: Secondary | ICD-10-CM | POA: Insufficient documentation

## 2023-11-18 DIAGNOSIS — Z9221 Personal history of antineoplastic chemotherapy: Secondary | ICD-10-CM | POA: Insufficient documentation

## 2023-11-18 LAB — RAD ONC ARIA SESSION SUMMARY
Course Elapsed Days: 6
Plan Fractions Treated to Date: 5
Plan Prescribed Dose Per Fraction: 2 Gy
Plan Total Fractions Prescribed: 30
Plan Total Prescribed Dose: 60 Gy
Reference Point Dosage Given to Date: 10 Gy
Reference Point Session Dosage Given: 2 Gy
Session Number: 5

## 2023-11-18 LAB — CBC WITH DIFFERENTIAL (CANCER CENTER ONLY)
Abs Immature Granulocytes: 0.09 10*3/uL — ABNORMAL HIGH (ref 0.00–0.07)
Basophils Absolute: 0.1 10*3/uL (ref 0.0–0.1)
Basophils Relative: 0 %
Eosinophils Absolute: 0.1 10*3/uL (ref 0.0–0.5)
Eosinophils Relative: 0 %
HCT: 42.5 % (ref 39.0–52.0)
Hemoglobin: 14 g/dL (ref 13.0–17.0)
Immature Granulocytes: 1 %
Lymphocytes Relative: 13 %
Lymphs Abs: 1.8 10*3/uL (ref 0.7–4.0)
MCH: 32 pg (ref 26.0–34.0)
MCHC: 32.9 g/dL (ref 30.0–36.0)
MCV: 97.3 fL (ref 80.0–100.0)
Monocytes Absolute: 1.6 10*3/uL — ABNORMAL HIGH (ref 0.1–1.0)
Monocytes Relative: 12 %
Neutro Abs: 9.9 10*3/uL — ABNORMAL HIGH (ref 1.7–7.7)
Neutrophils Relative %: 74 %
Platelet Count: 330 10*3/uL (ref 150–400)
RBC: 4.37 MIL/uL (ref 4.22–5.81)
RDW: 15 % (ref 11.5–15.5)
WBC Count: 13.5 10*3/uL — ABNORMAL HIGH (ref 4.0–10.5)
nRBC: 0 % (ref 0.0–0.2)

## 2023-11-18 LAB — CMP (CANCER CENTER ONLY)
ALT: 8 U/L (ref 0–44)
AST: 13 U/L — ABNORMAL LOW (ref 15–41)
Albumin: 3.5 g/dL (ref 3.5–5.0)
Alkaline Phosphatase: 58 U/L (ref 38–126)
Anion gap: 7 (ref 5–15)
BUN: 13 mg/dL (ref 8–23)
CO2: 27 mmol/L (ref 22–32)
Calcium: 9 mg/dL (ref 8.9–10.3)
Chloride: 104 mmol/L (ref 98–111)
Creatinine: 1.09 mg/dL (ref 0.61–1.24)
GFR, Estimated: >60 mL/min (ref >60)
Glucose, Bld: 122 mg/dL — ABNORMAL HIGH (ref 70–99)
Potassium: 3.8 mmol/L (ref 3.5–5.1)
Sodium: 138 mmol/L (ref 135–145)
Total Bilirubin: 0.8 mg/dL (ref 0.0–1.2)
Total Protein: 7.5 g/dL (ref 6.5–8.1)

## 2023-11-19 ENCOUNTER — Other Ambulatory Visit: Payer: Self-pay

## 2023-11-19 ENCOUNTER — Inpatient Hospital Stay: Payer: Medicare Other

## 2023-11-19 ENCOUNTER — Ambulatory Visit
Admission: RE | Admit: 2023-11-19 | Discharge: 2023-11-19 | Disposition: A | Discharge status: Home / Self Care | Payer: Medicare Other | Source: Ambulatory Visit | Attending: Radiation Oncology | Admitting: Radiation Oncology

## 2023-11-19 ENCOUNTER — Other Ambulatory Visit: Payer: Self-pay | Admitting: Physician Assistant

## 2023-11-19 VITALS — BP 135/77 | HR 79 | Temp 98.9°F | Resp 20

## 2023-11-19 DIAGNOSIS — Z5111 Encounter for antineoplastic chemotherapy: Secondary | ICD-10-CM | POA: Diagnosis not present

## 2023-11-19 DIAGNOSIS — C3412 Malignant neoplasm of upper lobe, left bronchus or lung: Secondary | ICD-10-CM | POA: Diagnosis not present

## 2023-11-19 DIAGNOSIS — Z87891 Personal history of nicotine dependence: Secondary | ICD-10-CM | POA: Diagnosis not present

## 2023-11-19 DIAGNOSIS — Z510 Encounter for antineoplastic radiation therapy: Secondary | ICD-10-CM | POA: Diagnosis not present

## 2023-11-19 DIAGNOSIS — D72819 Decreased white blood cell count, unspecified: Secondary | ICD-10-CM | POA: Diagnosis not present

## 2023-11-19 DIAGNOSIS — E118 Type 2 diabetes mellitus with unspecified complications: Secondary | ICD-10-CM

## 2023-11-19 DIAGNOSIS — E278 Other specified disorders of adrenal gland: Secondary | ICD-10-CM | POA: Diagnosis not present

## 2023-11-19 DIAGNOSIS — I959 Hypotension, unspecified: Secondary | ICD-10-CM | POA: Diagnosis not present

## 2023-11-19 LAB — RAD ONC ARIA SESSION SUMMARY
Course Elapsed Days: 7
Plan Fractions Treated to Date: 6
Plan Prescribed Dose Per Fraction: 2 Gy
Plan Total Fractions Prescribed: 30
Plan Total Prescribed Dose: 60 Gy
Reference Point Dosage Given to Date: 12 Gy
Reference Point Session Dosage Given: 2 Gy
Session Number: 6

## 2023-11-19 MED ORDER — DIPHENHYDRAMINE HCL 50 MG/ML IJ SOLN
50.0000 mg | Freq: Once | INTRAMUSCULAR | Status: AC
  Administered 2023-11-19: 25 mg via INTRAVENOUS
  Filled 2023-11-19: qty 1

## 2023-11-19 MED ORDER — PROCHLORPERAZINE MALEATE 10 MG PO TABS: 10.0000 mg | ORAL_TABLET | Freq: Four times a day (QID) | ORAL | 1 refills | Status: DC | PRN

## 2023-11-19 MED ORDER — DIPHENHYDRAMINE HCL 25 MG PO CAPS: 25.0000 mg | ORAL_CAPSULE | Freq: Once | ORAL | Status: DC

## 2023-11-19 MED ORDER — SODIUM CHLORIDE 0.9 % IV SOLN: INTRAVENOUS | Status: DC

## 2023-11-19 MED ORDER — SODIUM CHLORIDE 0.9 % IV SOLN
165.8000 mg | Freq: Once | INTRAVENOUS | Status: AC
  Administered 2023-11-19: 170 mg via INTRAVENOUS
  Filled 2023-11-19: qty 17

## 2023-11-19 MED ORDER — PALONOSETRON HCL INJECTION 0.25 MG/5ML
0.2500 mg | Freq: Once | INTRAVENOUS | Status: AC
  Administered 2023-11-19: 0.25 mg via INTRAVENOUS
  Filled 2023-11-19: qty 5

## 2023-11-19 MED ORDER — SODIUM CHLORIDE 0.9 % IV SOLN
45.0000 mg/m2 | Freq: Once | INTRAVENOUS | Status: AC
  Administered 2023-11-19: 102 mg via INTRAVENOUS
  Filled 2023-11-19: qty 17

## 2023-11-19 MED ORDER — DEXAMETHASONE SODIUM PHOSPHATE 10 MG/ML IJ SOLN
10.0000 mg | Freq: Once | INTRAMUSCULAR | Status: AC
  Administered 2023-11-19: 10 mg via INTRAVENOUS
  Filled 2023-11-19: qty 1

## 2023-11-19 MED ORDER — FAMOTIDINE IN NACL 20-0.9 MG/50ML-% IV SOLN
20.0000 mg | Freq: Once | INTRAVENOUS | Status: AC
  Administered 2023-11-19: 20 mg via INTRAVENOUS
  Filled 2023-11-19: qty 50

## 2023-11-19 NOTE — Progress Notes (Signed)
 Keep Carbo dose at 170 mg today per Dr Arbutus Ped.  Ebony Hail, Pharm.D., CPP 11/19/2023@9 :43 AM

## 2023-11-19 NOTE — Patient Instructions (Addendum)
 [facility] - A DEPT OF [facility]  Discharge Instructions: Thank you for choosing [facility] to provide your oncology and hematology care.   If you have a lab appointment with the Cancer Center, please go directly to the Cancer Center and check in at the registration area.   Wear comfortable clothing and clothing appropriate for easy access to any Portacath or PICC line.   We strive to give you quality time with your provider. You may need to reschedule your appointment if you arrive late (15 or more minutes).  Arriving late affects you and other patients whose appointments are after yours.  Also, if you miss three or more appointments without notifying the office, you may be dismissed from the clinic at the provider's discretion.      For prescription refill requests, have your pharmacy contact our office and allow 72 hours for refills to be completed.    Today you received the following chemotherapy and/or immunotherapy agents: Paclitaxel and Carboplatin      To help prevent nausea and vomiting after your treatment, we encourage you to take your nausea medication as directed.  BELOW ARE SYMPTOMS THAT SHOULD BE REPORTED IMMEDIATELY: *FEVER GREATER THAN 100.4 F (38 C) OR HIGHER *CHILLS OR SWEATING *NAUSEA AND VOMITING THAT IS NOT CONTROLLED WITH YOUR NAUSEA MEDICATION *UNUSUAL SHORTNESS OF BREATH *UNUSUAL BRUISING OR BLEEDING *URINARY PROBLEMS (pain or burning when urinating, or frequent urination) *BOWEL PROBLEMS (unusual diarrhea, constipation, pain near the anus) TENDERNESS IN MOUTH AND THROAT WITH OR WITHOUT PRESENCE OF ULCERS (sore throat, sores in mouth, or a toothache) UNUSUAL RASH, SWELLING OR PAIN   Items with * indicate a potential emergency and should be followed up as soon as possible or go to the Emergency Department if any problems should occur.  Please show the CHEMOTHERAPY ALERT CARD or IMMUNOTHERAPY ALERT CARD at check-in  to the Emergency Department and triage nurse.  Should you have questions after your visit or need to cancel or reschedule your appointment, please contact [facility]  Dept: [phone]  and follow the prompts.  Office hours are 8:00 a.m. to 4:30 p.m. Monday - Friday. Please note that voicemails left after 4:00 p.m. may not be returned until the following business day.  We are closed weekends and major holidays. You have access to a nurse at all times for urgent questions. Please call the main number to the clinic Dept: [phone] and follow the prompts.   For any non-urgent questions, you may also contact your provider using MyChart. We now offer e-Visits for anyone 38 and older to request care online for non-urgent symptoms. For details visit [email or web address].   Also download the MyChart app! Go to the app store, search "MyChart", open the app, select [place], and log in with your MyChart username and password.  Paclitaxel Injection What is this medication? PACLITAXEL (PAK li TAX el) treats some types of cancer. It works by slowing down the growth of cancer cells. This medicine may be used for other purposes; ask your health care provider or pharmacist if you have questions. COMMON BRAND NAME(S): Onxol, Taxol What should I tell my care team before I take this medication? They need to know if you have any of these conditions: Heart disease Liver disease Low white blood cell levels An unusual or allergic reaction to paclitaxel, other medications, foods, dyes, or preservatives If  you or your partner are pregnant or trying to get pregnant Breast-feeding How should I use this medication? This medication is injected into a vein. It is given by your care team in a hospital or clinic setting. Talk to your care team about the use of this medication in children. While it may be given to children for selected conditions, precautions  do apply. Overdosage: If you think you have taken too much of this medicine contact a poison control center or emergency room at once. NOTE: This medicine is only for you. Do not share this medicine with others. What if I miss a dose? Keep appointments for follow-up doses. It is important not to miss your dose. Call your care team if you are unable to keep an appointment. What may interact with this medication? Do not take this medication with any of the following: Live virus vaccines Other medications may affect the way this medication works. Talk with your care team about all of the medications you take. They may suggest changes to your treatment plan to lower the risk of side effects and to make sure your medications work as intended. This list may not describe all possible interactions. Give your health care provider a list of all the medicines, herbs, non-prescription drugs, or dietary supplements you use. Also tell them if you smoke, drink alcohol, or use illegal drugs. Some items may interact with your medicine. What should I watch for while using this medication? Your condition will be monitored carefully while you are receiving this medication. You may need blood work while taking this medication. This medication may make you feel generally unwell. This is not uncommon as chemotherapy can affect healthy cells as well as cancer cells. Report any side effects. Continue your course of treatment even though you feel ill unless your care team tells you to stop. This medication can cause serious allergic reactions. To reduce the risk, your care team may give you other medications to take before receiving this one. Be sure to follow the directions from your care team. This medication may increase your risk of getting an infection. Call your care team for advice if you get a fever, chills, sore throat, or other symptoms of a cold or flu. Do not treat yourself. Try to avoid being around people who are  sick. This medication may increase your risk to bruise or bleed. Call your care team if you notice any unusual bleeding. Be careful brushing or flossing your teeth or using a toothpick because you may get an infection or bleed more easily. If you have any dental work done, tell your dentist you are receiving this medication. Talk to your care team if you may be pregnant. Serious birth defects can occur if you take this medication during pregnancy. Talk to your care team before breastfeeding. Changes to your treatment plan may be needed. What side effects may I notice from receiving this medication? Side effects that you should report to your care team as soon as possible: Allergic reactions--skin rash, itching, hives, swelling of the face, lips, tongue, or throat Heart rhythm changes--fast or irregular heartbeat, dizziness, feeling faint or lightheaded, chest pain, trouble breathing Increase in blood pressure Infection--fever, chills, cough, sore throat, wounds that don't heal, pain or trouble when passing urine, general feeling of discomfort or being unwell Low blood pressure--dizziness, feeling faint or lightheaded, blurry vision Low red blood cell level--unusual weakness or fatigue, dizziness, headache, trouble breathing Painful swelling, warmth, or redness of the skin, blisters or  sores at the infusion site Pain, tingling, or numbness in the hands or feet Slow heartbeat--dizziness, feeling faint or lightheaded, confusion, trouble breathing, unusual weakness or fatigue Unusual bruising or bleeding Side effects that usually do not require medical attention (report to your care team if they continue or are bothersome): Diarrhea Hair loss Joint pain Loss of appetite Muscle pain Nausea Vomiting This list may not describe all possible side effects. Call your doctor for medical advice about side effects. You may report side effects to FDA at 1-800-FDA-1088. Where should I keep my  medication? This medication is given in a hospital or clinic. It will not be stored at home. NOTE: This sheet is a summary. It may not cover all possible information. If you have questions about this medicine, talk to your doctor, pharmacist, or health care provider.  2024 Elsevier/Gold Standard (2022-01-23 00:00:00)  Carboplatin Injection What is this medication? CARBOPLATIN (KAR boe pla tin) treats some types of cancer. It works by slowing down the growth of cancer cells. This medicine may be used for other purposes; ask your health care provider or pharmacist if you have questions. COMMON BRAND NAME(S): Paraplatin What should I tell my care team before I take this medication? They need to know if you have any of these conditions: Blood disorders Hearing problems Kidney disease Recent or ongoing radiation therapy An unusual or allergic reaction to carboplatin, cisplatin, other medications, foods, dyes, or preservatives Pregnant or trying to get pregnant Breast-feeding How should I use this medication? This medication is injected into a vein. It is given by your care team in a hospital or clinic setting. Talk to your care team about the use of this medication in children. Special care may be needed. Overdosage: If you think you have taken too much of this medicine contact a poison control center or emergency room at once. NOTE: This medicine is only for you. Do not share this medicine with others. What if I miss a dose? Keep appointments for follow-up doses. It is important not to miss your dose. Call your care team if you are unable to keep an appointment. What may interact with this medication? Medications for seizures Some antibiotics, such as amikacin, gentamicin, neomycin, streptomycin, tobramycin Vaccines This list may not describe all possible interactions. Give your health care provider a list of all the medicines, herbs, non-prescription drugs, or dietary supplements you use.  Also tell them if you smoke, drink alcohol, or use illegal drugs. Some items may interact with your medicine. What should I watch for while using this medication? Your condition will be monitored carefully while you are receiving this medication. You may need blood work while taking this medication. This medication may make you feel generally unwell. This is not uncommon, as chemotherapy can affect healthy cells as well as cancer cells. Report any side effects. Continue your course of treatment even though you feel ill unless your care team tells you to stop. In some cases, you may be given additional medications to help with side effects. Follow all directions for their use. This medication may increase your risk of getting an infection. Call your care team for advice if you get a fever, chills, sore throat, or other symptoms of a cold or flu. Do not treat yourself. Try to avoid being around people who are sick. Avoid taking medications that contain aspirin, acetaminophen, ibuprofen, naproxen, or ketoprofen unless instructed by your care team. These medications may hide a fever. Be careful brushing or flossing your teeth  or using a toothpick because you may get an infection or bleed more easily. If you have any dental work done, tell your dentist you are receiving this medication. Talk to your care team if you wish to become pregnant or think you might be pregnant. This medication can cause serious birth defects. Talk to your care team about effective forms of contraception. Do not breast-feed while taking this medication. What side effects may I notice from receiving this medication? Side effects that you should report to your care team as soon as possible: Allergic reactions--skin rash, itching, hives, swelling of the face, lips, tongue, or throat Infection--fever, chills, cough, sore throat, wounds that don't heal, pain or trouble when passing urine, general feeling of discomfort or being  unwell Low red blood cell level--unusual weakness or fatigue, dizziness, headache, trouble breathing Pain, tingling, or numbness in the hands or feet, muscle weakness, change in vision, confusion or trouble speaking, loss of balance or coordination, trouble walking, seizures Unusual bruising or bleeding Side effects that usually do not require medical attention (report to your care team if they continue or are bothersome): Hair loss Nausea Unusual weakness or fatigue Vomiting This list may not describe all possible side effects. Call your doctor for medical advice about side effects. You may report side effects to FDA at 1-800-FDA-1088. Where should I keep my medication? This medication is given in a hospital or clinic. It will not be stored at home. NOTE: This sheet is a summary. It may not cover all possible information. If you have questions about this medicine, talk to your doctor, pharmacist, or health care provider.  2024 Elsevier/Gold Standard (2021-12-26 00:00:00)

## 2023-11-20 ENCOUNTER — Other Ambulatory Visit: Payer: Self-pay | Admitting: Internal Medicine

## 2023-11-20 ENCOUNTER — Telehealth: Payer: Self-pay

## 2023-11-20 ENCOUNTER — Other Ambulatory Visit: Payer: Self-pay

## 2023-11-20 ENCOUNTER — Ambulatory Visit
Admission: RE | Admit: 2023-11-20 | Discharge: 2023-11-20 | Disposition: A | Discharge status: Home / Self Care | Payer: Medicare Other | Source: Ambulatory Visit | Attending: Radiation Oncology | Admitting: Radiation Oncology

## 2023-11-20 DIAGNOSIS — I959 Hypotension, unspecified: Secondary | ICD-10-CM | POA: Diagnosis not present

## 2023-11-20 DIAGNOSIS — Z87891 Personal history of nicotine dependence: Secondary | ICD-10-CM | POA: Diagnosis not present

## 2023-11-20 DIAGNOSIS — Z510 Encounter for antineoplastic radiation therapy: Secondary | ICD-10-CM | POA: Diagnosis not present

## 2023-11-20 DIAGNOSIS — E278 Other specified disorders of adrenal gland: Secondary | ICD-10-CM | POA: Diagnosis not present

## 2023-11-20 DIAGNOSIS — D72819 Decreased white blood cell count, unspecified: Secondary | ICD-10-CM | POA: Diagnosis not present

## 2023-11-20 DIAGNOSIS — Z5111 Encounter for antineoplastic chemotherapy: Secondary | ICD-10-CM | POA: Diagnosis not present

## 2023-11-20 DIAGNOSIS — C3412 Malignant neoplasm of upper lobe, left bronchus or lung: Secondary | ICD-10-CM | POA: Diagnosis not present

## 2023-11-20 LAB — RAD ONC ARIA SESSION SUMMARY
Course Elapsed Days: 8
Plan Fractions Treated to Date: 7
Plan Prescribed Dose Per Fraction: 2 Gy
Plan Total Fractions Prescribed: 30
Plan Total Prescribed Dose: 60 Gy
Reference Point Dosage Given to Date: 14 Gy
Reference Point Session Dosage Given: 2 Gy
Session Number: 7

## 2023-11-20 NOTE — Telephone Encounter (Signed)
LM for patient that this nurse was calling to see how they were doing after their treatment. Please call back to Dr. Rudi Rummage nurse at [phone] if they have any questions or concerns regarding the treatment.

## 2023-11-20 NOTE — Telephone Encounter (Signed)
-----   Message from Nurse Ashby Dawes sent at 11/19/2023  1:16 PM EST ----- Regarding: First time/ Paclitaxel and Carboplatin/ Dr Arbutus Ped pt Pt had first time paclitaxel and carboplatin today. Tolerated well.

## 2023-11-20 NOTE — Progress Notes (Signed)
 Pt called me at this time. He states he had received a VM from Radiation oncology. He called them and they directed him to med onc. The pt called me and after looking in his chart, it appears that the nurse educator had reached out to him for his chemo f/u phone call. Pt states "he feels great" Denies n/v/d, endorses eating well, small meals throughout the day, and drinking water and gatorade. Pt doesn't have any concerns at this time and knows he can reach out to me as needed.

## 2023-11-21 ENCOUNTER — Other Ambulatory Visit: Payer: Self-pay

## 2023-11-21 ENCOUNTER — Ambulatory Visit: Payer: Medicare Other

## 2023-11-21 ENCOUNTER — Ambulatory Visit
Admission: RE | Admit: 2023-11-21 | Discharge: 2023-11-21 | Disposition: A | Discharge status: Home / Self Care | Payer: Medicare Other | Source: Ambulatory Visit | Attending: Radiation Oncology | Admitting: Radiation Oncology

## 2023-11-21 ENCOUNTER — Other Ambulatory Visit: Payer: Medicare Other

## 2023-11-21 ENCOUNTER — Ambulatory Visit: Payer: Medicare Other | Admitting: Internal Medicine

## 2023-11-21 DIAGNOSIS — M6281 Muscle weakness (generalized): Secondary | ICD-10-CM | POA: Diagnosis not present

## 2023-11-21 DIAGNOSIS — Z5111 Encounter for antineoplastic chemotherapy: Secondary | ICD-10-CM | POA: Diagnosis not present

## 2023-11-21 DIAGNOSIS — R509 Fever, unspecified: Secondary | ICD-10-CM | POA: Diagnosis not present

## 2023-11-21 DIAGNOSIS — Z510 Encounter for antineoplastic radiation therapy: Secondary | ICD-10-CM | POA: Diagnosis not present

## 2023-11-21 DIAGNOSIS — C3412 Malignant neoplasm of upper lobe, left bronchus or lung: Secondary | ICD-10-CM | POA: Diagnosis not present

## 2023-11-21 DIAGNOSIS — R42 Dizziness and giddiness: Secondary | ICD-10-CM | POA: Diagnosis not present

## 2023-11-21 DIAGNOSIS — I959 Hypotension, unspecified: Secondary | ICD-10-CM | POA: Diagnosis not present

## 2023-11-21 DIAGNOSIS — D72819 Decreased white blood cell count, unspecified: Secondary | ICD-10-CM | POA: Diagnosis not present

## 2023-11-21 DIAGNOSIS — Z87891 Personal history of nicotine dependence: Secondary | ICD-10-CM | POA: Diagnosis not present

## 2023-11-21 DIAGNOSIS — I10 Essential (primary) hypertension: Secondary | ICD-10-CM | POA: Diagnosis not present

## 2023-11-21 DIAGNOSIS — E278 Other specified disorders of adrenal gland: Secondary | ICD-10-CM | POA: Diagnosis not present

## 2023-11-21 LAB — RAD ONC ARIA SESSION SUMMARY
Course Elapsed Days: 9
Plan Fractions Treated to Date: 8
Plan Prescribed Dose Per Fraction: 2 Gy
Plan Total Fractions Prescribed: 30
Plan Total Prescribed Dose: 60 Gy
Reference Point Dosage Given to Date: 16 Gy
Reference Point Session Dosage Given: 2 Gy
Session Number: 8

## 2023-11-22 ENCOUNTER — Ambulatory Visit
Admission: RE | Admit: 2023-11-22 | Discharge: 2023-11-22 | Disposition: A | Discharge status: Home / Self Care | Payer: Medicare Other | Source: Ambulatory Visit | Attending: Radiation Oncology | Admitting: Radiation Oncology

## 2023-11-22 ENCOUNTER — Ambulatory Visit
Admission: RE | Admit: 2023-11-22 | Discharge: 2023-11-22 | Disposition: A | Discharge status: Home / Self Care | Source: Ambulatory Visit | Attending: Radiation Oncology | Admitting: Radiation Oncology

## 2023-11-22 ENCOUNTER — Other Ambulatory Visit: Payer: Self-pay

## 2023-11-22 DIAGNOSIS — D72819 Decreased white blood cell count, unspecified: Secondary | ICD-10-CM | POA: Diagnosis not present

## 2023-11-22 DIAGNOSIS — Z5111 Encounter for antineoplastic chemotherapy: Secondary | ICD-10-CM | POA: Diagnosis not present

## 2023-11-22 DIAGNOSIS — C3412 Malignant neoplasm of upper lobe, left bronchus or lung: Secondary | ICD-10-CM | POA: Diagnosis not present

## 2023-11-22 DIAGNOSIS — I959 Hypotension, unspecified: Secondary | ICD-10-CM | POA: Diagnosis not present

## 2023-11-22 DIAGNOSIS — Z510 Encounter for antineoplastic radiation therapy: Secondary | ICD-10-CM | POA: Diagnosis not present

## 2023-11-22 DIAGNOSIS — Z87891 Personal history of nicotine dependence: Secondary | ICD-10-CM | POA: Diagnosis not present

## 2023-11-22 DIAGNOSIS — E278 Other specified disorders of adrenal gland: Secondary | ICD-10-CM | POA: Diagnosis not present

## 2023-11-22 LAB — RAD ONC ARIA SESSION SUMMARY
Course Elapsed Days: 10
Plan Fractions Treated to Date: 9
Plan Prescribed Dose Per Fraction: 2 Gy
Plan Total Fractions Prescribed: 30
Plan Total Prescribed Dose: 60 Gy
Reference Point Dosage Given to Date: 18 Gy
Reference Point Session Dosage Given: 2 Gy
Session Number: 9

## 2023-11-22 NOTE — Progress Notes (Signed)
 Pt called me at 3pm today to let me know about a situation that occurred yesterday. In the morning of 3/6, pt was feeling tired and weak. Pt was on C1D3 of his weekly carbo/taxol. Pts son, Tinnie Gens was visiting him and told him that he would take the pt to radiation because he should be driving. Pt took a nap after he got home from radiation and woke up around 430. Upon waking up pt states he was "out of it" Pt put his sone,Jeffrey on the phone to provide details. Tinnie Gens states the pt woke up from a nap, hallucinating, waving hands around, eyes glazed, looking around confused, dizzy, couldn't walk unassisted "totally out of it", paramedics called. Per Tinnie Gens, the pts BP was elevated, BSC elevated to over 200, and the pts oral temperature of 101.0. Tinnie Gens states that the pt declined to go to the ER. Paramedics suggested trying to get him to eat, which the pt did do. Tinnie Gens states around 9 or 10pm the pt seemed normal again.  Per Tinnie Gens, the pt's oral temp orally was 92 this morning, time unknown. I asked the pts son to recheck his temperature at the time of our conversation. 96.6 orally.  I asked Tinnie Gens if the pt had taken anything to treat his fever last night, like tylenol or ibuprofen. Tinnie Gens states that he took tylenol per the recommendation of the paramedics.  I told Tinnie Gens I was going ot reach out to the [facility], [place], and call him back with a recommendation. After discussing the pts concerns with Yvonna Alanis, it was recommended to the pt that he go to the ED for evaluation. I explained the elevated temperature and change in the pts mental status could indicate there is an infection present and without treatment he could get very sick. Pt was hesitant to go to the ED, as he would likely stay overnight, and he doesn't have any help with his wife who has alzheimers on the weekends. I verbalized understanding of the pts situation and told him that while it is our recommendation that he be evaluated, it is  ultimately up to him. If he decides not to go to the ED, I ask that he keep an eye on his temperatures and if he has another fever 100.71F or higher, or if he has another episode of AMS, his son should call an ambulance to bring him to the hospital.  Pt states he will go to the [facility] ED. Pt states he does have his chemo card and I told him to bring it with him. I told him I will call the ED charge nurse to let them know that he is coming in. Pt verbalized understanding.

## 2023-11-25 ENCOUNTER — Other Ambulatory Visit: Payer: Self-pay

## 2023-11-25 ENCOUNTER — Ambulatory Visit
Admission: RE | Admit: 2023-11-25 | Discharge: 2023-11-25 | Disposition: A | Discharge status: Home / Self Care | Payer: Medicare Other | Source: Ambulatory Visit | Attending: Radiation Oncology

## 2023-11-25 DIAGNOSIS — I959 Hypotension, unspecified: Secondary | ICD-10-CM | POA: Diagnosis not present

## 2023-11-25 DIAGNOSIS — Z87891 Personal history of nicotine dependence: Secondary | ICD-10-CM | POA: Diagnosis not present

## 2023-11-25 DIAGNOSIS — Z510 Encounter for antineoplastic radiation therapy: Secondary | ICD-10-CM | POA: Diagnosis not present

## 2023-11-25 DIAGNOSIS — D72819 Decreased white blood cell count, unspecified: Secondary | ICD-10-CM | POA: Diagnosis not present

## 2023-11-25 DIAGNOSIS — Z5111 Encounter for antineoplastic chemotherapy: Secondary | ICD-10-CM | POA: Diagnosis not present

## 2023-11-25 DIAGNOSIS — E278 Other specified disorders of adrenal gland: Secondary | ICD-10-CM | POA: Diagnosis not present

## 2023-11-25 DIAGNOSIS — C3412 Malignant neoplasm of upper lobe, left bronchus or lung: Secondary | ICD-10-CM | POA: Diagnosis not present

## 2023-11-25 LAB — RAD ONC ARIA SESSION SUMMARY
Course Elapsed Days: 13
Plan Fractions Treated to Date: 10
Plan Prescribed Dose Per Fraction: 2 Gy
Plan Total Fractions Prescribed: 30
Plan Total Prescribed Dose: 60 Gy
Reference Point Dosage Given to Date: 20 Gy
Reference Point Session Dosage Given: 2 Gy
Session Number: 10

## 2023-11-26 ENCOUNTER — Other Ambulatory Visit: Payer: Self-pay

## 2023-11-26 ENCOUNTER — Inpatient Hospital Stay: Payer: Medicare Other

## 2023-11-26 ENCOUNTER — Ambulatory Visit
Admission: RE | Admit: 2023-11-26 | Discharge: 2023-11-26 | Disposition: A | Discharge status: Home / Self Care | Payer: Medicare Other | Source: Ambulatory Visit | Attending: Radiation Oncology | Admitting: Radiation Oncology

## 2023-11-26 ENCOUNTER — Other Ambulatory Visit: Payer: Medicare Other

## 2023-11-26 ENCOUNTER — Encounter: Payer: Self-pay | Admitting: Internal Medicine

## 2023-11-26 VITALS — BP 140/70 | HR 82 | Temp 97.9°F | Resp 18 | Wt 224.0 lb

## 2023-11-26 DIAGNOSIS — Z510 Encounter for antineoplastic radiation therapy: Secondary | ICD-10-CM | POA: Diagnosis not present

## 2023-11-26 DIAGNOSIS — E278 Other specified disorders of adrenal gland: Secondary | ICD-10-CM | POA: Diagnosis not present

## 2023-11-26 DIAGNOSIS — D72819 Decreased white blood cell count, unspecified: Secondary | ICD-10-CM | POA: Diagnosis not present

## 2023-11-26 DIAGNOSIS — C3412 Malignant neoplasm of upper lobe, left bronchus or lung: Secondary | ICD-10-CM

## 2023-11-26 DIAGNOSIS — Z5111 Encounter for antineoplastic chemotherapy: Secondary | ICD-10-CM | POA: Diagnosis not present

## 2023-11-26 DIAGNOSIS — Z87891 Personal history of nicotine dependence: Secondary | ICD-10-CM | POA: Diagnosis not present

## 2023-11-26 DIAGNOSIS — I959 Hypotension, unspecified: Secondary | ICD-10-CM | POA: Diagnosis not present

## 2023-11-26 LAB — CMP (CANCER CENTER ONLY)
ALT: 10 U/L (ref 0–44)
AST: 12 U/L — ABNORMAL LOW (ref 15–41)
Albumin: 2.9 g/dL — ABNORMAL LOW (ref 3.5–5.0)
Alkaline Phosphatase: 53 U/L (ref 38–126)
Anion gap: 4 — ABNORMAL LOW (ref 5–15)
BUN: 17 mg/dL (ref 8–23)
CO2: 28 mmol/L (ref 22–32)
Calcium: 8.2 mg/dL — ABNORMAL LOW (ref 8.9–10.3)
Chloride: 108 mmol/L (ref 98–111)
Creatinine: 1.16 mg/dL (ref 0.61–1.24)
GFR, Estimated: >60 mL/min (ref >60)
Glucose, Bld: 142 mg/dL — ABNORMAL HIGH (ref 70–99)
Potassium: 3.7 mmol/L (ref 3.5–5.1)
Sodium: 140 mmol/L (ref 135–145)
Total Bilirubin: 0.4 mg/dL (ref 0.0–1.2)
Total Protein: 6.2 g/dL — ABNORMAL LOW (ref 6.5–8.1)

## 2023-11-26 LAB — RAD ONC ARIA SESSION SUMMARY
Course Elapsed Days: 14
Plan Fractions Treated to Date: 11
Plan Prescribed Dose Per Fraction: 2 Gy
Plan Total Fractions Prescribed: 30
Plan Total Prescribed Dose: 60 Gy
Reference Point Dosage Given to Date: 22 Gy
Reference Point Session Dosage Given: 2 Gy
Session Number: 11

## 2023-11-26 LAB — CBC WITH DIFFERENTIAL (CANCER CENTER ONLY)
Abs Immature Granulocytes: 0.02 10*3/uL (ref 0.00–0.07)
Basophils Absolute: 0 10*3/uL (ref 0.0–0.1)
Basophils Relative: 1 %
Eosinophils Absolute: 0.1 10*3/uL (ref 0.0–0.5)
Eosinophils Relative: 2 %
HCT: 35.4 % — ABNORMAL LOW (ref 39.0–52.0)
Hemoglobin: 11.7 g/dL — ABNORMAL LOW (ref 13.0–17.0)
Immature Granulocytes: 0 %
Lymphocytes Relative: 20 %
Lymphs Abs: 1 10*3/uL (ref 0.7–4.0)
MCH: 31.1 pg (ref 26.0–34.0)
MCHC: 33.1 g/dL (ref 30.0–36.0)
MCV: 94.1 fL (ref 80.0–100.0)
Monocytes Absolute: 0.6 10*3/uL (ref 0.1–1.0)
Monocytes Relative: 13 %
Neutro Abs: 3.1 10*3/uL (ref 1.7–7.7)
Neutrophils Relative %: 64 %
Platelet Count: 311 10*3/uL (ref 150–400)
RBC: 3.76 MIL/uL — ABNORMAL LOW (ref 4.22–5.81)
RDW: 15.2 % (ref 11.5–15.5)
WBC Count: 4.8 10*3/uL (ref 4.0–10.5)
nRBC: 0 % (ref 0.0–0.2)

## 2023-11-26 MED ORDER — DEXAMETHASONE SODIUM PHOSPHATE 10 MG/ML IJ SOLN
10.0000 mg | Freq: Once | INTRAMUSCULAR | Status: AC
  Administered 2023-11-26: 10 mg via INTRAVENOUS
  Filled 2023-11-26: qty 1

## 2023-11-26 MED ORDER — CARBOPLATIN CHEMO INJECTION 450 MG/45ML
165.8000 mg | Freq: Once | INTRAVENOUS | Status: AC
  Administered 2023-11-26: 170 mg via INTRAVENOUS
  Filled 2023-11-26: qty 17

## 2023-11-26 MED ORDER — SODIUM CHLORIDE 0.9 % IV SOLN
45.0000 mg/m2 | Freq: Once | INTRAVENOUS | Status: AC
  Administered 2023-11-26: 102 mg via INTRAVENOUS
  Filled 2023-11-26: qty 17

## 2023-11-26 MED ORDER — DIPHENHYDRAMINE HCL 50 MG/ML IJ SOLN
25.0000 mg | Freq: Once | INTRAMUSCULAR | Status: AC
  Administered 2023-11-26: 25 mg via INTRAVENOUS
  Filled 2023-11-26: qty 1

## 2023-11-26 MED ORDER — PALONOSETRON HCL INJECTION 0.25 MG/5ML
0.2500 mg | Freq: Once | INTRAVENOUS | Status: AC
  Administered 2023-11-26: 0.25 mg via INTRAVENOUS
  Filled 2023-11-26: qty 5

## 2023-11-26 MED ORDER — FAMOTIDINE IN NACL 20-0.9 MG/50ML-% IV SOLN
20.0000 mg | Freq: Once | INTRAVENOUS | Status: AC
  Administered 2023-11-26: 20 mg via INTRAVENOUS
  Filled 2023-11-26: qty 50

## 2023-11-26 MED ORDER — SODIUM CHLORIDE 0.9 % IV SOLN: INTRAVENOUS | Status: DC

## 2023-11-26 NOTE — Patient Instructions (Signed)
 [facility]  Discharge Instructions: Thank you for choosing [facility] to provide your oncology and hematology care.   If you have a lab appointment with the Cancer Center, please go directly to the Cancer Center and check in at the registration area.   Wear comfortable clothing and clothing appropriate for easy access to any Portacath or PICC line.   We strive to give you quality time with your provider. You may need to reschedule your appointment if you arrive late (15 or more minutes).  Arriving late affects you and other patients whose appointments are after yours.  Also, if you miss three or more appointments without notifying the office, you may be dismissed from the clinic at the provider's discretion.      For prescription refill requests, have your pharmacy contact our office and allow 72 hours for refills to be completed.    Today you received the following chemotherapy and/or immunotherapy agents: paclitaxel and carboplatin      To help prevent nausea and vomiting after your treatment, we encourage you to take your nausea medication as directed.  BELOW ARE SYMPTOMS THAT SHOULD BE REPORTED IMMEDIATELY: *FEVER GREATER THAN 100.4 F (38 C) OR HIGHER *CHILLS OR SWEATING *NAUSEA AND VOMITING THAT IS NOT CONTROLLED WITH YOUR NAUSEA MEDICATION *UNUSUAL SHORTNESS OF BREATH *UNUSUAL BRUISING OR BLEEDING *URINARY PROBLEMS (pain or burning when urinating, or frequent urination) *BOWEL PROBLEMS (unusual diarrhea, constipation, pain near the anus) TENDERNESS IN MOUTH AND THROAT WITH OR WITHOUT PRESENCE OF ULCERS (sore throat, sores in mouth, or a toothache) UNUSUAL RASH, SWELLING OR PAIN  UNUSUAL VAGINAL DISCHARGE OR ITCHING   Items with * indicate a potential emergency and should be followed up as soon as possible or go to the Emergency Department if any problems should occur.  Please show the CHEMOTHERAPY ALERT CARD  or IMMUNOTHERAPY ALERT CARD at check-in to the Emergency Department and triage nurse.  Should you have questions after your visit or need to cancel or reschedule your appointment, please contact [facility]  Dept: [phone]  and follow the prompts.  Office hours are 8:00 a.m. to 4:30 p.m. Monday - Friday. Please note that voicemails left after 4:00 p.m. may not be returned until the following business day.  We are closed weekends and major holidays. You have access to a nurse at all times for urgent questions. Please call the main number to the clinic Dept: [phone] and follow the prompts.   For any non-urgent questions, you may also contact your provider using MyChart. We now offer e-Visits for anyone 63 and older to request care online for non-urgent symptoms. For details visit [email or web address].   Also download the MyChart app! Go to the app store, search "MyChart", open the app, select [place], and log in with your MyChart username and password.

## 2023-11-27 ENCOUNTER — Other Ambulatory Visit: Payer: Self-pay

## 2023-11-27 ENCOUNTER — Ambulatory Visit
Admission: RE | Admit: 2023-11-27 | Discharge: 2023-11-27 | Disposition: A | Discharge status: Home / Self Care | Payer: Medicare Other | Source: Ambulatory Visit | Attending: Radiation Oncology | Admitting: Radiation Oncology

## 2023-11-27 DIAGNOSIS — E278 Other specified disorders of adrenal gland: Secondary | ICD-10-CM | POA: Diagnosis not present

## 2023-11-27 DIAGNOSIS — I959 Hypotension, unspecified: Secondary | ICD-10-CM | POA: Diagnosis not present

## 2023-11-27 DIAGNOSIS — C3412 Malignant neoplasm of upper lobe, left bronchus or lung: Secondary | ICD-10-CM | POA: Diagnosis not present

## 2023-11-27 DIAGNOSIS — Z510 Encounter for antineoplastic radiation therapy: Secondary | ICD-10-CM | POA: Diagnosis not present

## 2023-11-27 DIAGNOSIS — Z5111 Encounter for antineoplastic chemotherapy: Secondary | ICD-10-CM | POA: Diagnosis not present

## 2023-11-27 DIAGNOSIS — Z87891 Personal history of nicotine dependence: Secondary | ICD-10-CM | POA: Diagnosis not present

## 2023-11-27 DIAGNOSIS — D72819 Decreased white blood cell count, unspecified: Secondary | ICD-10-CM | POA: Diagnosis not present

## 2023-11-27 LAB — RAD ONC ARIA SESSION SUMMARY
Course Elapsed Days: 15
Plan Fractions Treated to Date: 12
Plan Prescribed Dose Per Fraction: 2 Gy
Plan Total Fractions Prescribed: 30
Plan Total Prescribed Dose: 60 Gy
Reference Point Dosage Given to Date: 24 Gy
Reference Point Session Dosage Given: 2 Gy
Session Number: 12

## 2023-11-28 ENCOUNTER — Other Ambulatory Visit: Payer: Medicare Other

## 2023-11-28 ENCOUNTER — Ambulatory Visit
Admission: RE | Admit: 2023-11-28 | Discharge: 2023-11-28 | Disposition: A | Discharge status: Home / Self Care | Payer: Medicare Other | Source: Ambulatory Visit | Attending: Radiation Oncology | Admitting: Radiation Oncology

## 2023-11-28 ENCOUNTER — Other Ambulatory Visit: Payer: Self-pay

## 2023-11-28 ENCOUNTER — Encounter: Payer: Self-pay | Admitting: Endocrinology

## 2023-11-28 ENCOUNTER — Encounter: Payer: Self-pay | Admitting: Internal Medicine

## 2023-11-28 ENCOUNTER — Ambulatory Visit: Payer: Medicare Other

## 2023-11-28 DIAGNOSIS — C3412 Malignant neoplasm of upper lobe, left bronchus or lung: Secondary | ICD-10-CM | POA: Diagnosis not present

## 2023-11-28 DIAGNOSIS — Z87891 Personal history of nicotine dependence: Secondary | ICD-10-CM | POA: Diagnosis not present

## 2023-11-28 DIAGNOSIS — Z510 Encounter for antineoplastic radiation therapy: Secondary | ICD-10-CM | POA: Diagnosis not present

## 2023-11-28 DIAGNOSIS — I959 Hypotension, unspecified: Secondary | ICD-10-CM | POA: Diagnosis not present

## 2023-11-28 DIAGNOSIS — E118 Type 2 diabetes mellitus with unspecified complications: Secondary | ICD-10-CM | POA: Diagnosis not present

## 2023-11-28 DIAGNOSIS — D72819 Decreased white blood cell count, unspecified: Secondary | ICD-10-CM | POA: Diagnosis not present

## 2023-11-28 DIAGNOSIS — E278 Other specified disorders of adrenal gland: Secondary | ICD-10-CM | POA: Diagnosis not present

## 2023-11-28 DIAGNOSIS — Z5111 Encounter for antineoplastic chemotherapy: Secondary | ICD-10-CM | POA: Diagnosis not present

## 2023-11-28 LAB — RAD ONC ARIA SESSION SUMMARY
Course Elapsed Days: 16
Plan Fractions Treated to Date: 13
Plan Prescribed Dose Per Fraction: 2 Gy
Plan Total Fractions Prescribed: 30
Plan Total Prescribed Dose: 60 Gy
Reference Point Dosage Given to Date: 26 Gy
Reference Point Session Dosage Given: 2 Gy
Session Number: 13

## 2023-11-29 ENCOUNTER — Ambulatory Visit
Admission: RE | Admit: 2023-11-29 | Discharge: 2023-11-29 | Disposition: A | Discharge status: Home / Self Care | Payer: Medicare Other | Source: Ambulatory Visit | Attending: Radiation Oncology | Admitting: Radiation Oncology

## 2023-11-29 ENCOUNTER — Other Ambulatory Visit: Payer: Self-pay

## 2023-11-29 ENCOUNTER — Encounter: Payer: Self-pay | Admitting: Internal Medicine

## 2023-11-29 ENCOUNTER — Other Ambulatory Visit: Payer: Self-pay | Admitting: Radiation Oncology

## 2023-11-29 ENCOUNTER — Ambulatory Visit
Admission: RE | Admit: 2023-11-29 | Discharge: 2023-11-29 | Disposition: A | Discharge status: Home / Self Care | Source: Ambulatory Visit | Attending: Radiation Oncology | Admitting: Radiation Oncology

## 2023-11-29 DIAGNOSIS — C3412 Malignant neoplasm of upper lobe, left bronchus or lung: Secondary | ICD-10-CM | POA: Diagnosis not present

## 2023-11-29 DIAGNOSIS — I959 Hypotension, unspecified: Secondary | ICD-10-CM | POA: Diagnosis not present

## 2023-11-29 DIAGNOSIS — Z510 Encounter for antineoplastic radiation therapy: Secondary | ICD-10-CM | POA: Diagnosis not present

## 2023-11-29 DIAGNOSIS — Z5111 Encounter for antineoplastic chemotherapy: Secondary | ICD-10-CM | POA: Diagnosis not present

## 2023-11-29 DIAGNOSIS — Z87891 Personal history of nicotine dependence: Secondary | ICD-10-CM | POA: Diagnosis not present

## 2023-11-29 DIAGNOSIS — E278 Other specified disorders of adrenal gland: Secondary | ICD-10-CM | POA: Diagnosis not present

## 2023-11-29 DIAGNOSIS — D72819 Decreased white blood cell count, unspecified: Secondary | ICD-10-CM | POA: Diagnosis not present

## 2023-11-29 LAB — RAD ONC ARIA SESSION SUMMARY
Course Elapsed Days: 17
Plan Fractions Treated to Date: 14
Plan Prescribed Dose Per Fraction: 2 Gy
Plan Total Fractions Prescribed: 30
Plan Total Prescribed Dose: 60 Gy
Reference Point Dosage Given to Date: 28 Gy
Reference Point Session Dosage Given: 2 Gy
Session Number: 14

## 2023-11-29 MED ORDER — SUCRALFATE 1 G PO TABS: 1.0000 g | ORAL_TABLET | Freq: Three times a day (TID) | ORAL | 2 refills | Status: DC

## 2023-12-01 ENCOUNTER — Encounter: Payer: Self-pay | Admitting: Internal Medicine

## 2023-12-01 LAB — HEMOGLOBIN A1C
Hgb A1c MFr Bld: 7.9 %{Hb} — ABNORMAL HIGH (ref <5.7)
Mean Plasma Glucose: 180 mg/dL
eAG (mmol/L): 10 mmol/L

## 2023-12-01 LAB — BASIC METABOLIC PANEL
BUN: 17 mg/dL (ref 7–25)
CO2: 24 mmol/L (ref 20–32)
Calcium: 8.2 mg/dL — ABNORMAL LOW (ref 8.6–10.3)
Chloride: 109 mmol/L (ref 98–110)
Creat: 1.04 mg/dL (ref 0.70–1.22)
Glucose, Bld: 200 mg/dL — ABNORMAL HIGH (ref 65–99)
Potassium: 3.9 mmol/L (ref 3.5–5.3)
Sodium: 141 mmol/L (ref 135–146)

## 2023-12-01 LAB — FRUCTOSAMINE: Fructosamine: 324 umol/L — ABNORMAL HIGH (ref 205–285)

## 2023-12-02 ENCOUNTER — Ambulatory Visit
Admission: RE | Admit: 2023-12-02 | Discharge: 2023-12-02 | Disposition: A | Discharge status: Home / Self Care | Payer: Medicare Other | Source: Ambulatory Visit | Attending: Radiation Oncology | Admitting: Radiation Oncology

## 2023-12-02 ENCOUNTER — Other Ambulatory Visit: Payer: Self-pay

## 2023-12-02 ENCOUNTER — Telehealth: Payer: Self-pay | Admitting: Internal Medicine

## 2023-12-02 DIAGNOSIS — E278 Other specified disorders of adrenal gland: Secondary | ICD-10-CM | POA: Diagnosis not present

## 2023-12-02 DIAGNOSIS — Z510 Encounter for antineoplastic radiation therapy: Secondary | ICD-10-CM | POA: Diagnosis not present

## 2023-12-02 DIAGNOSIS — I959 Hypotension, unspecified: Secondary | ICD-10-CM | POA: Diagnosis not present

## 2023-12-02 DIAGNOSIS — D72819 Decreased white blood cell count, unspecified: Secondary | ICD-10-CM | POA: Diagnosis not present

## 2023-12-02 DIAGNOSIS — C3412 Malignant neoplasm of upper lobe, left bronchus or lung: Secondary | ICD-10-CM | POA: Diagnosis not present

## 2023-12-02 DIAGNOSIS — Z87891 Personal history of nicotine dependence: Secondary | ICD-10-CM | POA: Diagnosis not present

## 2023-12-02 DIAGNOSIS — Z5111 Encounter for antineoplastic chemotherapy: Secondary | ICD-10-CM | POA: Diagnosis not present

## 2023-12-02 LAB — RAD ONC ARIA SESSION SUMMARY
Course Elapsed Days: 20
Plan Fractions Treated to Date: 15
Plan Prescribed Dose Per Fraction: 2 Gy
Plan Total Fractions Prescribed: 30
Plan Total Prescribed Dose: 60 Gy
Reference Point Dosage Given to Date: 30 Gy
Reference Point Session Dosage Given: 2 Gy
Session Number: 15

## 2023-12-02 NOTE — Telephone Encounter (Signed)
 Pt came in to try and get his and wifes appt on the same date and time now that he is getting chemo and radiation upstairs. Please advise if he can be seen back to back with his wife on 12/06/23. Or when they can be seen back to back.

## 2023-12-02 NOTE — Telephone Encounter (Signed)
 Can see them on 12/13/23-one at 1:40 and the other at 2:20, will block the 2pm spot.

## 2023-12-02 NOTE — Telephone Encounter (Signed)
 He has radiation scheduled everyday through mid April. Recommend he finish w/ radiation then f/u w/ Dr. Drue Novel.

## 2023-12-02 NOTE — Telephone Encounter (Signed)
 He has a radiation treatment that day so he can't do it that day is there another day?

## 2023-12-03 ENCOUNTER — Inpatient Hospital Stay (HOSPITAL_BASED_OUTPATIENT_CLINIC_OR_DEPARTMENT_OTHER): Payer: Medicare Other | Admitting: Internal Medicine

## 2023-12-03 ENCOUNTER — Inpatient Hospital Stay: Payer: Medicare Other

## 2023-12-03 ENCOUNTER — Ambulatory Visit
Admission: RE | Admit: 2023-12-03 | Discharge: 2023-12-03 | Disposition: A | Discharge status: Home / Self Care | Payer: Medicare Other | Source: Ambulatory Visit | Attending: Radiation Oncology

## 2023-12-03 ENCOUNTER — Other Ambulatory Visit: Payer: Self-pay

## 2023-12-03 ENCOUNTER — Encounter: Payer: Self-pay | Admitting: Endocrinology

## 2023-12-03 ENCOUNTER — Other Ambulatory Visit: Payer: Medicare Other

## 2023-12-03 ENCOUNTER — Ambulatory Visit (INDEPENDENT_AMBULATORY_CARE_PROVIDER_SITE_OTHER): Payer: Medicare Other | Admitting: Endocrinology

## 2023-12-03 VITALS — BP 132/70 | HR 95 | Resp 20 | Ht 74.0 in | Wt 217.4 lb

## 2023-12-03 VITALS — BP 135/70 | HR 92 | Temp 98.1°F | Resp 16 | Ht 74.0 in | Wt 220.4 lb

## 2023-12-03 DIAGNOSIS — E278 Other specified disorders of adrenal gland: Secondary | ICD-10-CM | POA: Diagnosis not present

## 2023-12-03 DIAGNOSIS — Z794 Long term (current) use of insulin: Secondary | ICD-10-CM

## 2023-12-03 DIAGNOSIS — C3412 Malignant neoplasm of upper lobe, left bronchus or lung: Secondary | ICD-10-CM

## 2023-12-03 DIAGNOSIS — Z87891 Personal history of nicotine dependence: Secondary | ICD-10-CM | POA: Diagnosis not present

## 2023-12-03 DIAGNOSIS — Z5111 Encounter for antineoplastic chemotherapy: Secondary | ICD-10-CM | POA: Diagnosis not present

## 2023-12-03 DIAGNOSIS — D72819 Decreased white blood cell count, unspecified: Secondary | ICD-10-CM | POA: Diagnosis not present

## 2023-12-03 DIAGNOSIS — Z510 Encounter for antineoplastic radiation therapy: Secondary | ICD-10-CM | POA: Diagnosis not present

## 2023-12-03 DIAGNOSIS — I959 Hypotension, unspecified: Secondary | ICD-10-CM | POA: Diagnosis not present

## 2023-12-03 DIAGNOSIS — E1165 Type 2 diabetes mellitus with hyperglycemia: Secondary | ICD-10-CM

## 2023-12-03 LAB — CMP (CANCER CENTER ONLY)
ALT: 9 U/L (ref 0–44)
AST: 12 U/L — ABNORMAL LOW (ref 15–41)
Albumin: 3.3 g/dL — ABNORMAL LOW (ref 3.5–5.0)
Alkaline Phosphatase: 66 U/L (ref 38–126)
Anion gap: 8 (ref 5–15)
BUN: 16 mg/dL (ref 8–23)
CO2: 27 mmol/L (ref 22–32)
Calcium: 8.9 mg/dL (ref 8.9–10.3)
Chloride: 106 mmol/L (ref 98–111)
Creatinine: 1.09 mg/dL (ref 0.61–1.24)
GFR, Estimated: >60 mL/min (ref >60)
Glucose, Bld: 160 mg/dL — ABNORMAL HIGH (ref 70–99)
Potassium: 3.6 mmol/L (ref 3.5–5.1)
Sodium: 141 mmol/L (ref 135–145)
Total Bilirubin: 0.4 mg/dL (ref 0.0–1.2)
Total Protein: 6.9 g/dL (ref 6.5–8.1)

## 2023-12-03 LAB — CBC WITH DIFFERENTIAL (CANCER CENTER ONLY)
Abs Immature Granulocytes: 0.01 10*3/uL (ref 0.00–0.07)
Basophils Absolute: 0 10*3/uL (ref 0.0–0.1)
Basophils Relative: 1 %
Eosinophils Absolute: 0 10*3/uL (ref 0.0–0.5)
Eosinophils Relative: 2 %
HCT: 38.3 % — ABNORMAL LOW (ref 39.0–52.0)
Hemoglobin: 12.6 g/dL — ABNORMAL LOW (ref 13.0–17.0)
Immature Granulocytes: 0 %
Lymphocytes Relative: 30 %
Lymphs Abs: 0.8 10*3/uL (ref 0.7–4.0)
MCH: 31.5 pg (ref 26.0–34.0)
MCHC: 32.9 g/dL (ref 30.0–36.0)
MCV: 95.8 fL (ref 80.0–100.0)
Monocytes Absolute: 0.5 10*3/uL (ref 0.1–1.0)
Monocytes Relative: 18 %
Neutro Abs: 1.3 10*3/uL — ABNORMAL LOW (ref 1.7–7.7)
Neutrophils Relative %: 49 %
Platelet Count: 338 10*3/uL (ref 150–400)
RBC: 4 MIL/uL — ABNORMAL LOW (ref 4.22–5.81)
RDW: 15.1 % (ref 11.5–15.5)
WBC Count: 2.6 10*3/uL — ABNORMAL LOW (ref 4.0–10.5)
nRBC: 0 % (ref 0.0–0.2)

## 2023-12-03 LAB — RAD ONC ARIA SESSION SUMMARY
Course Elapsed Days: 21
Plan Fractions Treated to Date: 16
Plan Prescribed Dose Per Fraction: 2 Gy
Plan Total Fractions Prescribed: 30
Plan Total Prescribed Dose: 60 Gy
Reference Point Dosage Given to Date: 32 Gy
Reference Point Session Dosage Given: 2 Gy
Session Number: 16

## 2023-12-03 MED ORDER — SODIUM CHLORIDE 0.9 % IV SOLN
165.8000 mg | Freq: Once | INTRAVENOUS | Status: AC
  Administered 2023-12-03: 170 mg via INTRAVENOUS
  Filled 2023-12-03: qty 17

## 2023-12-03 MED ORDER — DEXAMETHASONE SODIUM PHOSPHATE 10 MG/ML IJ SOLN
10.0000 mg | Freq: Once | INTRAMUSCULAR | Status: AC
Start: 2023-12-03 — End: 2023-12-03
  Administered 2023-12-03: 10 mg via INTRAVENOUS
  Filled 2023-12-03: qty 1

## 2023-12-03 MED ORDER — LANTUS SOLOSTAR 100 UNIT/ML ~~LOC~~ SOPN: 15.0000 [IU] | PEN_INJECTOR | Freq: Every day | SUBCUTANEOUS | 11 refills | Status: DC

## 2023-12-03 MED ORDER — PALONOSETRON HCL INJECTION 0.25 MG/5ML
0.2500 mg | Freq: Once | INTRAVENOUS | Status: AC
  Administered 2023-12-03: 0.25 mg via INTRAVENOUS
  Filled 2023-12-03: qty 5

## 2023-12-03 MED ORDER — SODIUM CHLORIDE 0.9 % IV SOLN
45.0000 mg/m2 | Freq: Once | INTRAVENOUS | Status: AC
  Administered 2023-12-03: 102 mg via INTRAVENOUS
  Filled 2023-12-03: qty 17

## 2023-12-03 MED ORDER — SODIUM CHLORIDE 0.9 % IV SOLN: INTRAVENOUS | Status: DC

## 2023-12-03 MED ORDER — DIPHENHYDRAMINE HCL 50 MG/ML IJ SOLN
25.0000 mg | Freq: Once | INTRAMUSCULAR | Status: AC
Start: 2023-12-03 — End: 2023-12-03
  Administered 2023-12-03: 25 mg via INTRAVENOUS
  Filled 2023-12-03: qty 1

## 2023-12-03 MED ORDER — INSULIN PEN NEEDLE 32G X 4 MM MISC: 3 refills | Status: DC

## 2023-12-03 MED ORDER — FAMOTIDINE IN NACL 20-0.9 MG/50ML-% IV SOLN
20.0000 mg | Freq: Once | INTRAVENOUS | Status: AC
  Administered 2023-12-03: 20 mg via INTRAVENOUS
  Filled 2023-12-03: qty 50

## 2023-12-03 NOTE — Progress Notes (Signed)
 [facility] [phone]   [phone]  OFFICE PROGRESS NOTE  Jonathan Plump, MD [address] [place] [address]  DIAGNOSIS: stage IIIa (T3, N2, M0) non-small cell lung cancer, squamous cell carcinoma presented with left upper lobe lung mass with suspicious mediastinal lymphadenopathy diagnosed in January 2025   PRIOR THERAPY: None  CURRENT THERAPY: Concurrent chemoradiation with weekly carboplatin for AUC of 2 and paclitaxel 45 Mg/M2.  Status post 3 cycles.  INTERVAL HISTORY: Jonathan [name]. 88 y.o. male returns to the clinic today for follow-up visit.Discussed the use of AI scribe software for clinical note transcription with the patient, who gave verbal consent to proceed.  History of Present Illness   Jonathan [name]. is an 88 year old male with stage three non-small cell lung cancer squamous cell carcinoma who presents for ongoing chemotherapy treatment.  He was diagnosed with stage three non-small cell lung cancer, specifically squamous cell carcinoma, in January 2025. This diagnosis has necessitated a comprehensive treatment plan involving chemotherapy and radiation.  He missed the first session due to hospitalization but has since been consistent with his treatment schedule.  He experiences fatigue as a side effect of the chemotherapy but otherwise feels good.  His lab work shows a slightly low white blood cell count, though it remains within acceptable limits for continuing chemotherapy.       MEDICAL HISTORY: Past Medical History:  Diagnosis Date   CAD (coronary artery disease)    Two RCA stents remotely / 3rd RCA stent 2006   Colon polyps    s/p several Cscopes.   COPD (chronic obstructive pulmonary disease) (HCC)    on O2, nocturnal   Diabetes mellitus    dx aprox 2009   ED (erectile dysfunction)    has a vacumm device   Ejection fraction    Hyperlipidemia    dx in 90s   Hypertension    dx in the  72   Hypogonadism male    PVD (peripheral vascular disease) (HCC)    s/p stents at LE 2009, Dr Jacinto Halim   Shortness of breath    O2 Sat dropped to 82% walking on the treadmill, September, 2012    ALLERGIES:  has no known allergies.  MEDICATIONS:  Current Outpatient Medications  Medication Sig Dispense Refill   albuterol (VENTOLIN HFA) 108 (90 Base) MCG/ACT inhaler USE 1 TO 2 INHALATIONS EVERY 6 HOURS AS NEEDED FOR WHEEZING OR SHORTNESS OF BREATH 34 g 3   Blood Glucose Monitoring Suppl (FREESTYLE FREEDOM LITE) w/Device KIT Use Freestyle Freedom Lite meter to check blood sugar twice daily. DX:E11.65 1 kit 0   clopidogrel (PLAVIX) 75 MG tablet Take 1 tablet (75 mg total) by mouth daily with breakfast. 90 tablet 1   Dulaglutide (TRULICITY) 3 MG/0.5ML SOAJ INJECT 3 MG UNDER THE SKIN ONCE A WEEK 6 mL 3   empagliflozin (JARDIANCE) 25 MG TABS tablet Take 1 tablet (25 mg total) by mouth daily before breakfast. For diabetes E11.65 and E 11.29 90 tablet 3   furosemide (LASIX) 40 MG tablet Take 1 tablet (40 mg total) by mouth daily. 90 tablet 1   gabapentin (NEURONTIN) 100 MG capsule Take 100 mg by mouth 2 (two) times daily.     glucose blood (FREESTYLE LITE) test strip USE AS INSTRUCTED 100 strip 11   hydrOXYzine (ATARAX) 25 MG tablet Take 1 tablet by mouth at bedtime as needed.  insulin glargine (LANTUS SOLOSTAR) 100 UNIT/ML Solostar Pen Inject 15 Units into the skin daily. 15 mL 11   Insulin Pen Needle 32G X 4 MM MISC Use 1 x a day 100 each 3   metFORMIN (GLUCOPHAGE) 500 MG tablet Take 1 tablet (500 mg total) by mouth 2 (two) times daily with a meal. 180 tablet 3   metoprolol (TOPROL-XL) 200 MG 24 hr tablet Take 100 mg by mouth daily.     Multiple Vitamin (MULTIVITAMIN WITH MINERALS) TABS tablet Take 1 tablet by mouth daily.     ondansetron (ZOFRAN) 8 MG tablet Take 1 tablet (8 mg total) by mouth every 8 (eight) hours as needed for nausea or vomiting. Start on the third day after chemotherapy. 30  tablet 1   OXYGEN Inhale 2 L/min into the lungs at bedtime. PRN during day     prochlorperazine (COMPAZINE) 10 MG tablet Take 1 tablet (10 mg total) by mouth every 6 (six) hours as needed for nausea or vomiting. 30 tablet 1   sacubitril-valsartan (ENTRESTO) 24-26 MG Take 1 tablet by mouth 2 (two) times daily. 60 tablet 0   simvastatin (ZOCOR) 20 MG tablet Take 20 mg by mouth daily.     sucralfate (CARAFATE) 1 g tablet Take 1 tablet (1 g total) by mouth 4 (four) times daily -  with meals and at bedtime. 5 min before meals for radiation induced esophagitis 120 tablet 2   tamsulosin (FLOMAX) 0.4 MG CAPS capsule Take 1 capsule (0.4 mg total) by mouth daily after supper. 90 capsule 1   Tiotropium Bromide-Olodaterol (STIOLTO RESPIMAT) 2.5-2.5 MCG/ACT AERS Inhale 2 puffs into the lungs daily. 12 g 4   No current facility-administered medications for this visit.    SURGICAL HISTORY:  Past Surgical History:  Procedure Laterality Date   APPENDECTOMY     BRONCHIAL BIOPSY  10/01/2023   Procedure: BRONCHIAL BIOPSIES;  Surgeon: Josephine Igo, DO;  Location: [facility];  Service: Pulmonary;;   BRONCHIAL BRUSHINGS  10/01/2023   Procedure: BRONCHIAL BRUSHINGS;  Surgeon: Josephine Igo, DO;  Location: [facility];  Service: Pulmonary;;   BRONCHIAL WASHINGS  10/01/2023   Procedure: BRONCHIAL WASHINGS;  Surgeon: Josephine Igo, DO;  Location: [facility];  Service: Pulmonary;;   CORONARY STENT INTERVENTION N/A 01/12/2022   Procedure: CORONARY STENT INTERVENTION;  Surgeon: Swaziland, Peter M, MD;  Location: [facility];  Service: Cardiovascular;  Laterality: N/A;   HEMOSTASIS CONTROL  10/01/2023   Procedure: HEMOSTASIS CONTROL;  Surgeon: Josephine Igo, DO;  Location: [facility];  Service: Pulmonary;;   LEFT HEART CATH AND CORONARY ANGIOGRAPHY N/A 03/13/2023   Procedure: LEFT HEART CATH AND CORONARY ANGIOGRAPHY;  Surgeon: Swazil[place], Peter M, MD;  Location: [facility] INV[facility] CV LAB;  Service:  Cardiovascular;  Laterality: N/A;   RIGHT/LEFT HEART CATH AND CORONARY ANGIOGRAPHY N/A 01/11/2022   Procedure: RIGHT/LEFT HEART CATH AND CORONARY ANGIOGRAPHY;  Surgeon: Lyn Records, MD;  Location: [facility];  Service: Cardiovascular;  Laterality: N/A;   TONSILLECTOMY     VIDEO BRONCHOSCOPY  10/01/2023   Procedure: VIDEO BRONCHOSCOPY WITHOUT FLUORO;  Surgeon: Josephine Igo, DO;  Location: [facility];  Service: Pulmonary;;    REVIEW OF SYSTEMS:  A comprehensive review of systems was negative except for: Constitutional: positive for fatigue   PHYSICAL EXAMINATION: General appearance: alert, cooperative, fatigued, and no distress Head: Normocephalic, without obvious abnormality, atraumatic Neck: no adenopathy, no JVD, supple, symmetrical, trachea midline, and thyroid not enlarged, symmetric, no tenderness/mass/nodules  Lymph nodes: Cervical, supraclavicular, and axillary nodes normal. Resp: clear to auscultation bilaterally Back: symmetric, no curvature. ROM normal. No CVA tenderness. Cardio: regular rate and rhythm, S1, S2 normal, no murmur, click, rub or gallop GI: soft, non-tender; bowel sounds normal; no masses,  no organomegaly Extremities: extremities normal, atraumatic, no cyanosis or edema  ECOG PERFORMANCE STATUS: 1 - Symptomatic but completely ambulatory  Blood pressure 135/70, pulse 92, temperature 98.1 F (36.7 C), temperature source Temporal, resp. rate 16, height 6\' 2"  (1.88 m), weight 220 lb 6.4 oz (100 kg), SpO2 100%.  LABORATORY DATA: Lab Results  Component Value Date   WBC 2.6 (L) 12/03/2023   HGB 12.6 (L) 12/03/2023   HCT 38.3 (L) 12/03/2023   MCV 95.8 12/03/2023   PLT 338 12/03/2023      Chemistry      Component Value Date/Time   NA 141 12/03/2023 1344   NA 144 03/05/2023 0955   K 3.6 12/03/2023 1344   CL 106 12/03/2023 1344   CO2 27 12/03/2023 1344   BUN 16 12/03/2023 1344   BUN 17 03/05/2023 0955   CREATININE 1.09 12/03/2023 1344    CREATININE 1.04 11/28/2023 0803      Component Value Date/Time   CALCIUM 8.9 12/03/2023 1344   ALKPHOS 66 12/03/2023 1344   AST 12 (L) 12/03/2023 1344   ALT 9 12/03/2023 1344   BILITOT 0.4 12/03/2023 1344       RADIOGRAPHIC STUDIES: CT Angio Chest PE W/Cm &/Or Wo Cm Result Date: 11/11/2023 CLINICAL DATA:  Pulmonary embolus suspected with high probability. EXAM: CT ANGIOGRAPHY CHEST WITH CONTRAST TECHNIQUE: Multidetector CT imaging of the chest was performed using the standard protocol during bolus administration of intravenous contrast. Multiplanar CT image reconstructions and MIPs were obtained to evaluate the vascular anatomy. RADIATION DOSE REDUCTION: This exam was performed according to the departmental dose-optimization program which includes automated exposure control, adjustment of the mA and/or kV according to patient size and/or use of iterative reconstruction technique. CONTRAST:  75mL OMNIPAQUE IOHEXOL 350 MG/ML SOLN COMPARISON:  Chest radiograph 11/11/2023. PET-CT 10/25/2023. CT chest 09/19/2023 FINDINGS: Cardiovascular: Technically adequate study with good opacification of the central and segmental pulmonary arteries. Mild motion artifact. No focal filling defects are identified. No evidence of significant pulmonary embolus. Slow flow demonstrated in the left lingular pulmonary artery corresponding to likely extrinsic compression related to known left lung mass lesion. Mild cardiac enlargement. Trace pericardial effusion. Normal caliber thoracic aorta with calcification. Mediastinum/Nodes: Thyroid gland is unremarkable. Esophagus is decompressed. Mediastinal lymph nodes are present with largest left aortopulmonic window nodes measuring about 12 mm short axis dimension. Lymph nodes are similar to prior study and demonstrated activity on recent PET-CT scan suggesting metastatic involvement. Lungs/Pleura: Left perihilar mass lesion measuring about 4.6 x 5.4 cm in diameter, mildly enlarged  since prior study. There is also increased postobstructive pneumonia in the left lingula which may account for some of the change in size. Progression is not excluded. Prominent emphysematous changes throughout the lungs. Atelectasis in the lung bases. Upper Abdomen: No acute abnormalities.  No adrenal gland nodules. Musculoskeletal: Degenerative changes in the spine. No metastatic lesions identified. Review of the MIP images confirms the above findings. IMPRESSION: 1. 5.4 cm diameter left perihilar mass lesion corresponding to known bronchogenic carcinoma and suggesting enlargement since prior study. 2. Increasing postobstructive pneumonia in the left lingula. 3. No evidence of significant pulmonary embolus. There is evidence of slow flow in the left lingular pulmonary artery which likely results from extrinsic  compression due to the mass. 4. Severe emphysematous changes in the lungs. 5. Aortic atherosclerosis. 6. Mediastinal lymphadenopathy is unchanged since prior studies but likely metastatic. Electronically Signed   By: Burman Nieves M.D.   On: 11/11/2023 18:39   DG Chest Port 1 View Result Date: 11/11/2023 CLINICAL DATA:  Weakness. EXAM: PORTABLE CHEST 1 VIEW COMPARISON:  10/01/2023. FINDINGS: Redemonstration of patient's known left hilar mass, better evaluated on the recent PET-CT scan from 10/25/2023. No significant interval change. There are additional opacities in the left lung which may represent postobstructive pneumonia. Correlate clinically. Bilateral lung fields are otherwise clear. Bilateral costophrenic angles are clear. Stable cardio-mediastinal silhouette. No acute osseous abnormalities. The soft tissues are within normal limits. IMPRESSION: Redemonstration of patient's known left hilar mass, better evaluated on the recent PET-CT scan from 10/25/2023. There are additional opacities in the left lung which may represent postobstructive pneumonia. Electronically Signed   By: Jules Schick M.D.    On: 11/11/2023 15:47    ASSESSMENT AND PLAN: This is a very pleasant 88 years old African-American male with stage IIIa (T3, N2, M0) non-small cell lung cancer, squamous cell carcinoma presented with left upper lobe lung mass with suspicious mediastinal lymphadenopathy diagnosed in January 2025  He is currently on a course of concurrent chemoradiation with weekly carboplatin for AUC of 2 and paclitaxel 45 Mg/M2 status post 3 cycles.     Stage III non-small cell lung cancer, squamous cell carcinoma Diagnosed in January 2025. Undergoing weekly chemotherapy with carboplatin and paclitaxel. Completed three cycles, with today being the fourth. Missed the first session due to hospitalization. Reports fatigue, a common side effect of chemotherapy. White blood cell count is slightly low but acceptable for treatment. - Continue weekly chemotherapy with carboplatin and paclitaxel - Monitor white blood cell count regularly - Schedule next treatment session in one week  Leukopenia White blood cell count is slightly low but within acceptable range for continuing chemotherapy. Requires monitoring to ensure it remains at a safe level for treatment. - Monitor white blood cell count regularly  Fatigue secondary to chemotherapy Experiencing fatigue, likely due to ongoing chemotherapy treatment. This is a common side effect of carboplatin and paclitaxel. - Provide supportive care for fatigue - Encourage rest and hydration   The patient was advised to call immediately if he has any concerning symptoms in the interval.  The patient voices understanding of current disease status and treatment options and is in agreement with the current care plan.  All questions were answered. The patient knows to call the clinic with any problems, questions or concerns. We can certainly see the patient much sooner if necessary.  The total time spent in the appointment was 20 minutes.  Disclaimer: This note was dictated  with voice recognition software. Similar sounding words can inadvertently be transcribed and may not be corrected upon review.

## 2023-12-03 NOTE — Patient Instructions (Signed)
 Start Lantus 15 units daily. Rest medications same.   Check glucose at least twice a day, in the morning fasting and at bedtime.  Okay to have glucose 200-300 range when on Dexamethasone as part of chemotherapy.

## 2023-12-03 NOTE — Telephone Encounter (Signed)
 Called and informed pt to come back after radiation. Pt already has appointment the day after his radiation ends.

## 2023-12-03 NOTE — Patient Instructions (Signed)
 [facility] - A DEPT OF [facility]  Discharge Instructions: Thank you for choosing [facility] to provide your oncology and hematology care.   If you have a lab appointment with the Cancer Center, please go directly to the Cancer Center and check in at the registration area.   Wear comfortable clothing and clothing appropriate for easy access to any Portacath or PICC line.   We strive to give you quality time with your provider. You may need to reschedule your appointment if you arrive late (15 or more minutes).  Arriving late affects you and other patients whose appointments are after yours.  Also, if you miss three or more appointments without notifying the office, you may be dismissed from the clinic at the provider's discretion.      For prescription refill requests, have your pharmacy contact our office and allow 72 hours for refills to be completed.    Today you received the following chemotherapy and/or immunotherapy agents CARBOplatin (PARAPLATIN) and PACLitaxel (TAXOL)       To help prevent nausea and vomiting after your treatment, we encourage you to take your nausea medication as directed.  BELOW ARE SYMPTOMS THAT SHOULD BE REPORTED IMMEDIATELY: *FEVER GREATER THAN 100.4 F (38 C) OR HIGHER *CHILLS OR SWEATING *NAUSEA AND VOMITING THAT IS NOT CONTROLLED WITH YOUR NAUSEA MEDICATION *UNUSUAL SHORTNESS OF BREATH *UNUSUAL BRUISING OR BLEEDING *URINARY PROBLEMS (pain or burning when urinating, or frequent urination) *BOWEL PROBLEMS (unusual diarrhea, constipation, pain near the anus) TENDERNESS IN MOUTH AND THROAT WITH OR WITHOUT PRESENCE OF ULCERS (sore throat, sores in mouth, or a toothache) UNUSUAL RASH, SWELLING OR PAIN  UNUSUAL VAGINAL DISCHARGE OR ITCHING   Items with * indicate a potential emergency and should be followed up as soon as possible or go to the Emergency Department if any problems should occur.  Please show the  CHEMOTHERAPY ALERT CARD or IMMUNOTHERAPY ALERT CARD at check-in to the Emergency Department and triage nurse.  Should you have questions after your visit or need to cancel or reschedule your appointment, please contact [facility] - A DEPT OF [facility]  Dept: [phone]  and follow the prompts.  Office hours are 8:00 a.m. to 4:30 p.m. Monday - Friday. Please note that voicemails left after 4:00 p.m. may not be returned until the following business day.  We are closed weekends and major holidays. You have access to a nurse at all times for urgent questions. Please call the main number to the clinic Dept: [phone] and follow the prompts.   For any non-urgent questions, you may also contact your provider using MyChart. We now offer e-Visits for anyone 23 and older to request care online for non-urgent symptoms. For details visit [email or web address].   Also download the MyChart app! Go to the app store, search "MyChart", open the app, select [facility], and log in with your MyChart username and password.

## 2023-12-03 NOTE — Progress Notes (Signed)
 Outpatient Endocrinology Note Jonathan Kelii Chittum, MD  12/03/23  Patient's Name: Jonathan [name].    DOB: Aug 12, 1936    MRN: [id number]                                                    REASON OF VISIT: Follow up of type 2 diabetes mellitus  PCP: Wanda Plump, MD  HISTORY OF PRESENT ILLNESS:   Jonathan [name]. is a 88 y.o. old male with past medical history listed below, is here for follow up for type 2 diabetes mellitus.   Pertinent Diabetes History: Patient was diagnosed with type 2 diabetes mellitus in 2009.  Patient has controlled type 2 diabetes mellitus.  Chronic Diabetes Complications : Retinopathy: no. Last ophthalmology exam was done on annually, reportedly, following with ophthalmology regularly.  Nephropathy: CKD III, on ACE/ARB /valsartan Peripheral neuropathy: yes, on gabapentin. Has PAD. Coronary artery disease: yes. Stroke: no  Relevant comorbidities and cardiovascular risk factors: Obesity: no Body mass index is 27.91 kg/m.  Hypertension: Yes  Hyperlipidemia : Yes, on statin   Current / Home Diabetic regimen includes:  Metformin 500 mg 2 times a day. Trulicity 3.0 mg weekly. Jardiance 25 mg daily.  Prior diabetic medications:  Glycemic data:   He forgot to bring glucometer, no glucose data to review.  He reports he has not been checking blood sugar much lately.  Hypoglycemia: Patient has no hypoglycemic episodes. Patient has hypoglycemia awareness.  Factors modifying glucose control: 1.  Diabetic diet assessment: 3 meals a day.  2.  Staying active or exercising: No formal exercise per physically active at home take care of his demented wife.  3.  Medication compliance: compliant all of the time.  # HYPOGONADISM He had decreased libido and erectile dysfunction previously and was found to have hypogonadism, probably in 2011.He did have a slightly low free testosterone level in 2012 on treatment. Prolactin level normal.  Previously on AndroGel. With  his hemoglobin going up to 19.1 in August 2019 he has been told not to take any testosterone. He was given clomiphene as a trial but he did not try this. Testosterone level is persistently low without testosterone therapy although only mildly decreased now. He has had some fatigue overall but not significant now. Because of his history of erythrocytosis will not start any supplement. He has been followed by hematologist for erythrocytosis.  Interval history  Diabetes regimen reviewed and as noted above.  He was diagnosed with lung cancer in January 2025, following with oncology and is started on chemotherapy and radiation therapy.  He received 2 cycles of chemotherapy along with dexamethasone, he is on weekly chemotherapy, reports 4 cycles to complete.  Hemoglobin A1c increased to 7.9%.  He has reasonable control of diabetes mellitus with last hemoglobin A1c was 6.9%.  No glucose data to review.  He reports he has not been checking blood sugar much lately.  He received 2 doses of dexamethasone on March 4 and March 11, probably causing hyperglycemia to worsen diabetes control.  He has no other complaints today.   REVIEW OF SYSTEMS As per history of present illness.   PAST MEDICAL HISTORY: Past Medical History:  Diagnosis Date   CAD (coronary artery disease)    Two RCA stents remotely / 3rd RCA stent 2006   Colon polyps  s/p several Cscopes.   COPD (chronic obstructive pulmonary disease) (HCC)    on O2, nocturnal   Diabetes mellitus    dx aprox 2009   ED (erectile dysfunction)    has a vacumm device   Ejection fraction    Hyperlipidemia    dx in 90s   Hypertension    dx in the 5   Hypogonadism male    PVD (peripheral vascular disease) (HCC)    s/p stents at LE 2009, Dr Jacinto Halim   Shortness of breath    O2 Sat dropped to 82% walking on the treadmill, September, 2012    PAST SURGICAL HISTORY: Past Surgical History:  Procedure Laterality Date   APPENDECTOMY     BRONCHIAL BIOPSY   10/01/2023   Procedure: BRONCHIAL BIOPSIES;  Surgeon: Josephine Igo, DO;  Location: [facility];  Service: Pulmonary;;   BRONCHIAL BRUSHINGS  10/01/2023   Procedure: BRONCHIAL BRUSHINGS;  Surgeon: Josephine Igo, DO;  Location: [facility];  Service: Pulmonary;;   BRONCHIAL WASHINGS  10/01/2023   Procedure: BRONCHIAL WASHINGS;  Surgeon: Josephine Igo, DO;  Location: [facility];  Service: Pulmonary;;   CORONARY STENT INTERVENTION N/A 01/12/2022   Procedure: CORONARY STENT INTERVENTION;  Surgeon: Swaziland, Peter M, MD;  Location: [facility];  Service: Cardiovascular;  Laterality: N/A;   HEMOSTASIS CONTROL  10/01/2023   Procedure: HEMOSTASIS CONTROL;  Surgeon: Josephine Igo, DO;  Location: [facility];  Service: Pulmonary;;   LEFT HEART CATH AND CORONARY ANGIOGRAPHY N/A 03/13/2023   Procedure: LEFT HEART CATH AND CORONARY ANGIOGRAPHY;  Surgeon: Swaziland, Peter M, MD;  Location: [facility] INV[facility]IVE CV LAB;  Service: Cardiovascular;  Laterality: N/A;   RIGHT/LEFT HEART CATH AND CORONARY ANGIOGRAPHY N/A 01/11/2022   Procedure: RIGHT/LEFT HEART CATH AND CORONARY ANGIOGRAPHY;  Surgeon: Lyn Records, MD;  Location: [facility];  Service: Cardiovascular;  Laterality: N/A;   TONSILLECTOMY     VIDEO BRONCHOSCOPY  10/01/2023   Procedure: VIDEO BRONCHOSCOPY WITHOUT FLUORO;  Surgeon: Josephine Igo, DO;  Location: [facility];  Service: Pulmonary;;    ALLERGIES: No Known Allergies  FAMILY HISTORY:  Family History  Problem Relation Age of Onset   Heart disease Father    Hypertension Father    Stroke Father    Diabetes Paternal Aunt    Diabetes Maternal Grandmother    Diabetes Other        GM, nephews, many family members   Hyperlipidemia Other        ?   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   Tobacco Use   Smoking status: Former    Current packs/day: 0.00    Average packs/day: 2.0 packs/day for 30.0 years (60.0 ttl pk-yrs)    Types: Cigarettes    Start date: 06/18/1947    Quit date: 06/17/1977    Years since quitting: 46.4   Smokeless tobacco: Never   Tobacco comments:    2 ppd, quit 1978  Vaping Use   Vaping status: Never Used  Substance and Sexual Activity   Alcohol use: Not Currently   Drug use: No  Sexual activity: Yes  Other Topics Concern   Not on file  Social History Narrative   4 children (lost 1 son)   Wife has 2 children                Social Drivers of Corporate investment banker Strain: Low Risk  (07/20/2021)   Overall Financial Resource Strain (CARDIA)    Difficulty of Paying Living Expenses: Not hard at all  Food Insecurity: No Food Insecurity (09/24/2023)   Hunger Vital Sign    Worried About Running Out of Food in the Last Year: Never true    Ran Out of Food in the Last Year: Never true  Transportation Needs: No Transportation Needs (09/24/2023)   PRAPARE - Administrator, Civil Service (Medical): No    Lack of Transportation (Non-Medical): No  Physical Activity: Inactive (07/20/2021)   Exercise Vital Sign    Days of Exercise per Week: 0 days    Minutes of Exercise per Session: 0 min  Stress: No Stress Concern Present (07/20/2021)   [employer] of Occupational Health - Occupational Stress Questionnaire    Feeling of Stress : Not at all  Social Connections: Socially Integrated (09/17/2023)   Social Connection and Isolation Panel [NHANES]    Frequency of Communication with Friends and Family: More than three times a week    Frequency of Social Gatherings with Friends and Family: Twice a week    Attends Religious Services: More than 4 times per year    Active Member of [employer] or Organizations: Yes     Attends Banker Meetings: 1 to 4 times per year    Marital Status: Married    MEDICATIONS:  Current Outpatient Medications  Medication Sig Dispense Refill   albuterol (VENTOLIN HFA) 108 (90 Base) MCG/ACT inhaler USE 1 TO 2 INHALATIONS EVERY 6 HOURS AS NEEDED FOR WHEEZING OR SHORTNESS OF BREATH 34 g 3   Blood Glucose Monitoring Suppl (FREESTYLE FREEDOM LITE) w/Device KIT Use Freestyle Freedom Lite meter to check blood sugar twice daily. DX:E11.65 1 kit 0   clopidogrel (PLAVIX) 75 MG tablet Take 1 tablet (75 mg total) by mouth daily with breakfast. 90 tablet 1   Dulaglutide (TRULICITY) 3 MG/0.5ML SOAJ INJECT 3 MG UNDER THE SKIN ONCE A WEEK 6 mL 3   empagliflozin (JARDIANCE) 25 MG TABS tablet Take 1 tablet (25 mg total) by mouth daily before breakfast. For diabetes E11.65 and E 11.29 90 tablet 3   furosemide (LASIX) 40 MG tablet Take 1 tablet (40 mg total) by mouth daily. 90 tablet 1   gabapentin (NEURONTIN) 100 MG capsule Take 100 mg by mouth 2 (two) times daily.     glucose blood (FREESTYLE LITE) test strip USE AS INSTRUCTED 100 strip 11   hydrOXYzine (ATARAX) 25 MG tablet Take 1 tablet by mouth at bedtime as needed.     insulin glargine (LANTUS SOLOSTAR) 100 UNIT/ML Solostar Pen Inject 15 Units into the skin daily. 15 mL 11   Insulin Pen Needle 32G X 4 MM MISC Use 1 x a day 100 each 3   metFORMIN (GLUCOPHAGE) 500 MG tablet Take 1 tablet (500 mg total) by mouth 2 (two) times daily with a meal. 180 tablet 3   metoprolol (TOPROL-XL) 200 MG 24 hr tablet Take 100 mg by mouth daily.     Multiple Vitamin (MULTIVITAMIN WITH MINERALS) TABS tablet Take 1 tablet by mouth daily.     ondansetron (ZOFRAN) 8 MG tablet Take 1  tablet (8 mg total) by mouth every 8 (eight) hours as needed for nausea or vomiting. Start on the third day after chemotherapy. 30 tablet 1   OXYGEN Inhale 2 L/min into the lungs at bedtime. PRN during day     prochlorperazine (COMPAZINE) 10 MG tablet Take 1 tablet (10 mg  total) by mouth every 6 (six) hours as needed for nausea or vomiting. 30 tablet 1   sacubitril-valsartan (ENTRESTO) 24-26 MG Take 1 tablet by mouth 2 (two) times daily. 60 tablet 0   simvastatin (ZOCOR) 20 MG tablet Take 20 mg by mouth daily.     sucralfate (CARAFATE) 1 g tablet Take 1 tablet (1 g total) by mouth 4 (four) times daily -  with meals and at bedtime. 5 min before meals for radiation induced esophagitis 120 tablet 2   tamsulosin (FLOMAX) 0.4 MG CAPS capsule Take 1 capsule (0.4 mg total) by mouth daily after supper. 90 capsule 1   Tiotropium Bromide-Olodaterol (STIOLTO RESPIMAT) 2.5-2.5 MCG/ACT AERS Inhale 2 puffs into the lungs daily. 12 g 4   No current facility-administered medications for this visit.    PHYSICAL EXAM: Vitals:   12/03/23 0958  BP: 132/70  Pulse: 95  Resp: 20  SpO2: 96%  Weight: 217 lb 6.4 oz (98.6 kg)  Height: 6\' 2"  (1.88 m)   Body mass index is 27.91 kg/m.  Wt Readings from Last 3 Encounters:  12/03/23 217 lb 6.4 oz (98.6 kg)  11/26/23 224 lb (101.6 kg)  11/18/23 223 lb 1.6 oz (101.2 kg)    General: Well developed, well nourished male in no apparent distress.  HEENT: AT/[place], no external lesions.  Eyes: Conjunctiva clear and no icterus. Neck: Neck supple  Lungs: Respirations not labored Neurologic: Alert, oriented, normal speech Extremities / Skin: Dry.   Psychiatric: Does not appear depressed or anxious  Diabetic Foot Exam - Simple   No data filed    LABS Reviewed Lab Results  Component Value Date   HGBA1C 7.9 (H) 11/28/2023   HGBA1C 6.9 (H) 09/09/2023   HGBA1C 6.4 (A) 08/01/2023   Lab Results  Component Value Date   FRUCTOSAMINE 324 (H) 11/28/2023   FRUCTOSAMINE 250 04/26/2023   FRUCTOSAMINE 319 (H) 12/28/2021   Lab Results  Component Value Date   CHOL 108 04/01/2023   HDL 43.40 04/01/2023   LDLCALC 48 04/01/2023   LDLDIRECT 55.0 01/01/2018   TRIG 83.0 04/01/2023   CHOLHDL 2 04/01/2023   Lab Results  Component Value Date    MICRALBCREAT 18.3 04/01/2023   MICRALBCREAT 8.1 04/09/2022   Lab Results  Component Value Date   CREATININE 1.04 11/28/2023   Lab Results  Component Value Date   GFR 40.58 (L) 08/02/2023    ASSESSMENT / PLAN  1. Uncontrolled type 2 diabetes mellitus with hyperglycemia, with long-term current use of insulin (HCC)    Diabetes Mellitus type 2, complicated by peripheral neuropathy/CAD/CKD. - Diabetic status / severity: Controlled.  Lab Results  Component Value Date   HGBA1C 7.9 (H) 11/28/2023    - Hemoglobin A1c goal : <7%  Anticipate hyperglycemia due to dexamethasone with chemotherapy for the treatment of lung cancer.  He received 2 doses of dexamethasone every week in last 2 weeks.  He will be getting 4 doses of dexamethasone weekly with chemotherapy.  I would like to start on basal insulin.  He would ideally need fast acting mealtime insulin however we will avoid mealtime insulin to limit the complexity of treatment for him.  - Medications:  See below. Start Lantus 15 units daily in the morning.  Hoping this insulin therapy will  be temporary until he is on steroid for chemotherapy treatment.  Continue metformin 500 mg 2 times a day. Continue Trulicity 3.0 mg weekly. Continue Jardiance 25 mg daily.  - Home glucose testing: Advised to check blood sugar in the morning fasting and bedtime.  Okay to have blood sugar 200-300 range to have permissive hyperglycemia while being on dexamethasone for chemotherapy treatment. - Discussed/ Gave Hypoglycemia treatment plan.  # Consult : not required at this time.   # Annual urine for microalbuminuria/ creatinine ratio, no microalbuminuria currently, continue ACE/ARB /valsartan. Last  Lab Results  Component Value Date   MICRALBCREAT 18.3 04/01/2023    # Foot check nightly / neuropathy, continue gabapentin, managed by PCP.  # Annual dilated diabetic eye exams.   - Diet: Make healthy diabetic food choices - Life style / activity  / exercise: Discussed.  2. Blood pressure  -  BP Readings from Last 1 Encounters:  12/03/23 132/70    - Control is in target.  - No change in current plans.  Managed by PCP.  3. Lipid status / Hyperlipidemia - Last  Lab Results  Component Value Date   LDLCALC 48 04/01/2023   - Continue simvastatin 20 mg daily.  Managed by PCP.  Diagnoses and all orders for this visit:  Uncontrolled type 2 diabetes mellitus with hyperglycemia, with long-term current use of insulin (HCC) -     insulin glargine (LANTUS SOLOSTAR) 100 UNIT/ML Solostar Pen; Inject 15 Units into the skin daily. -     Insulin Pen Needle 32G X 4 MM MISC; Use 1 x a day     DISPOSITION Follow up in clinic in 4 to 5 weeks suggested.   All questions answered and patient verbalized understanding of the plan.  Jonathan Teryl Mcconaghy, MD [facility] Endocrinology [facility] [address] [place], [address] [place], [place] [address] Phone # [phone]  At least part of this note was generated using voice recognition software. Inadvertent word errors may have occurred, which were not recognized during the proofreading process.

## 2023-12-04 ENCOUNTER — Other Ambulatory Visit: Payer: Self-pay

## 2023-12-04 ENCOUNTER — Ambulatory Visit
Admission: RE | Admit: 2023-12-04 | Discharge: 2023-12-04 | Disposition: A | Discharge status: Home / Self Care | Payer: Medicare Other | Source: Ambulatory Visit | Attending: Radiation Oncology

## 2023-12-04 DIAGNOSIS — C3412 Malignant neoplasm of upper lobe, left bronchus or lung: Secondary | ICD-10-CM | POA: Diagnosis not present

## 2023-12-04 DIAGNOSIS — Z5111 Encounter for antineoplastic chemotherapy: Secondary | ICD-10-CM | POA: Diagnosis not present

## 2023-12-04 DIAGNOSIS — I959 Hypotension, unspecified: Secondary | ICD-10-CM | POA: Diagnosis not present

## 2023-12-04 DIAGNOSIS — E278 Other specified disorders of adrenal gland: Secondary | ICD-10-CM | POA: Diagnosis not present

## 2023-12-04 DIAGNOSIS — D72819 Decreased white blood cell count, unspecified: Secondary | ICD-10-CM | POA: Diagnosis not present

## 2023-12-04 DIAGNOSIS — Z510 Encounter for antineoplastic radiation therapy: Secondary | ICD-10-CM | POA: Diagnosis not present

## 2023-12-04 DIAGNOSIS — Z87891 Personal history of nicotine dependence: Secondary | ICD-10-CM | POA: Diagnosis not present

## 2023-12-04 LAB — RAD ONC ARIA SESSION SUMMARY
Course Elapsed Days: 22
Plan Fractions Treated to Date: 17
Plan Prescribed Dose Per Fraction: 2 Gy
Plan Total Fractions Prescribed: 30
Plan Total Prescribed Dose: 60 Gy
Reference Point Dosage Given to Date: 34 Gy
Reference Point Session Dosage Given: 2 Gy
Session Number: 17

## 2023-12-05 ENCOUNTER — Ambulatory Visit
Admission: RE | Admit: 2023-12-05 | Discharge: 2023-12-05 | Disposition: A | Discharge status: Home / Self Care | Payer: Medicare Other | Source: Ambulatory Visit | Attending: Radiation Oncology | Admitting: Radiation Oncology

## 2023-12-05 ENCOUNTER — Other Ambulatory Visit: Payer: Self-pay

## 2023-12-05 ENCOUNTER — Ambulatory Visit: Payer: Medicare Other

## 2023-12-05 ENCOUNTER — Ambulatory Visit: Payer: Medicare Other | Admitting: Internal Medicine

## 2023-12-05 ENCOUNTER — Other Ambulatory Visit: Payer: Medicare Other

## 2023-12-05 DIAGNOSIS — E278 Other specified disorders of adrenal gland: Secondary | ICD-10-CM | POA: Diagnosis not present

## 2023-12-05 DIAGNOSIS — I959 Hypotension, unspecified: Secondary | ICD-10-CM | POA: Diagnosis not present

## 2023-12-05 DIAGNOSIS — D72819 Decreased white blood cell count, unspecified: Secondary | ICD-10-CM | POA: Diagnosis not present

## 2023-12-05 DIAGNOSIS — Z510 Encounter for antineoplastic radiation therapy: Secondary | ICD-10-CM | POA: Diagnosis not present

## 2023-12-05 DIAGNOSIS — Z5111 Encounter for antineoplastic chemotherapy: Secondary | ICD-10-CM | POA: Diagnosis not present

## 2023-12-05 DIAGNOSIS — Z87891 Personal history of nicotine dependence: Secondary | ICD-10-CM | POA: Diagnosis not present

## 2023-12-05 DIAGNOSIS — C3412 Malignant neoplasm of upper lobe, left bronchus or lung: Secondary | ICD-10-CM | POA: Diagnosis not present

## 2023-12-05 LAB — RAD ONC ARIA SESSION SUMMARY
Course Elapsed Days: 23
Plan Fractions Treated to Date: 18
Plan Prescribed Dose Per Fraction: 2 Gy
Plan Total Fractions Prescribed: 30
Plan Total Prescribed Dose: 60 Gy
Reference Point Dosage Given to Date: 36 Gy
Reference Point Session Dosage Given: 2 Gy
Session Number: 18

## 2023-12-06 ENCOUNTER — Ambulatory Visit
Admission: RE | Admit: 2023-12-06 | Discharge: 2023-12-06 | Disposition: A | Discharge status: Home / Self Care | Payer: Medicare Other | Source: Ambulatory Visit | Attending: Radiation Oncology | Admitting: Radiation Oncology

## 2023-12-06 ENCOUNTER — Other Ambulatory Visit: Payer: Self-pay

## 2023-12-06 ENCOUNTER — Ambulatory Visit
Admission: RE | Admit: 2023-12-06 | Discharge: 2023-12-06 | Disposition: A | Discharge status: Home / Self Care | Source: Ambulatory Visit | Attending: Radiation Oncology | Admitting: Radiation Oncology

## 2023-12-06 DIAGNOSIS — Z510 Encounter for antineoplastic radiation therapy: Secondary | ICD-10-CM | POA: Diagnosis not present

## 2023-12-06 DIAGNOSIS — C3412 Malignant neoplasm of upper lobe, left bronchus or lung: Secondary | ICD-10-CM | POA: Diagnosis not present

## 2023-12-06 DIAGNOSIS — Z5111 Encounter for antineoplastic chemotherapy: Secondary | ICD-10-CM | POA: Diagnosis not present

## 2023-12-06 DIAGNOSIS — E278 Other specified disorders of adrenal gland: Secondary | ICD-10-CM | POA: Diagnosis not present

## 2023-12-06 DIAGNOSIS — Z87891 Personal history of nicotine dependence: Secondary | ICD-10-CM | POA: Diagnosis not present

## 2023-12-06 DIAGNOSIS — D72819 Decreased white blood cell count, unspecified: Secondary | ICD-10-CM | POA: Diagnosis not present

## 2023-12-06 DIAGNOSIS — I959 Hypotension, unspecified: Secondary | ICD-10-CM | POA: Diagnosis not present

## 2023-12-06 LAB — RAD ONC ARIA SESSION SUMMARY
Course Elapsed Days: 24
Plan Fractions Treated to Date: 19
Plan Prescribed Dose Per Fraction: 2 Gy
Plan Total Fractions Prescribed: 30
Plan Total Prescribed Dose: 60 Gy
Reference Point Dosage Given to Date: 38 Gy
Reference Point Session Dosage Given: 2 Gy
Session Number: 19

## 2023-12-09 ENCOUNTER — Ambulatory Visit
Admission: RE | Admit: 2023-12-09 | Discharge: 2023-12-09 | Disposition: A | Discharge status: Home / Self Care | Payer: Medicare Other | Source: Ambulatory Visit | Attending: Radiation Oncology

## 2023-12-09 ENCOUNTER — Other Ambulatory Visit: Payer: Self-pay

## 2023-12-09 DIAGNOSIS — Z5111 Encounter for antineoplastic chemotherapy: Secondary | ICD-10-CM | POA: Diagnosis not present

## 2023-12-09 DIAGNOSIS — C3412 Malignant neoplasm of upper lobe, left bronchus or lung: Secondary | ICD-10-CM | POA: Diagnosis not present

## 2023-12-09 DIAGNOSIS — Z87891 Personal history of nicotine dependence: Secondary | ICD-10-CM | POA: Diagnosis not present

## 2023-12-09 DIAGNOSIS — D72819 Decreased white blood cell count, unspecified: Secondary | ICD-10-CM | POA: Diagnosis not present

## 2023-12-09 DIAGNOSIS — Z510 Encounter for antineoplastic radiation therapy: Secondary | ICD-10-CM | POA: Diagnosis not present

## 2023-12-09 DIAGNOSIS — I959 Hypotension, unspecified: Secondary | ICD-10-CM | POA: Diagnosis not present

## 2023-12-09 DIAGNOSIS — E278 Other specified disorders of adrenal gland: Secondary | ICD-10-CM | POA: Diagnosis not present

## 2023-12-09 LAB — RAD ONC ARIA SESSION SUMMARY
Course Elapsed Days: 27
Plan Fractions Treated to Date: 20
Plan Prescribed Dose Per Fraction: 2 Gy
Plan Total Fractions Prescribed: 30
Plan Total Prescribed Dose: 60 Gy
Reference Point Dosage Given to Date: 40 Gy
Reference Point Session Dosage Given: 2 Gy
Session Number: 20

## 2023-12-10 ENCOUNTER — Other Ambulatory Visit: Payer: Self-pay | Admitting: Medical Oncology

## 2023-12-10 ENCOUNTER — Encounter: Payer: Self-pay | Admitting: Internal Medicine

## 2023-12-10 ENCOUNTER — Inpatient Hospital Stay: Payer: Medicare Other

## 2023-12-10 ENCOUNTER — Other Ambulatory Visit: Payer: Self-pay

## 2023-12-10 ENCOUNTER — Other Ambulatory Visit: Payer: Medicare Other

## 2023-12-10 ENCOUNTER — Ambulatory Visit
Admission: RE | Admit: 2023-12-10 | Discharge: 2023-12-10 | Disposition: A | Discharge status: Home / Self Care | Payer: Medicare Other | Source: Ambulatory Visit | Attending: Radiation Oncology | Admitting: Radiation Oncology

## 2023-12-10 VITALS — BP 131/82 | HR 92 | Temp 98.2°F | Resp 20 | Ht 74.0 in | Wt 212.5 lb

## 2023-12-10 DIAGNOSIS — E278 Other specified disorders of adrenal gland: Secondary | ICD-10-CM | POA: Diagnosis not present

## 2023-12-10 DIAGNOSIS — D72819 Decreased white blood cell count, unspecified: Secondary | ICD-10-CM | POA: Diagnosis not present

## 2023-12-10 DIAGNOSIS — C3412 Malignant neoplasm of upper lobe, left bronchus or lung: Secondary | ICD-10-CM

## 2023-12-10 DIAGNOSIS — Z510 Encounter for antineoplastic radiation therapy: Secondary | ICD-10-CM | POA: Diagnosis not present

## 2023-12-10 DIAGNOSIS — Z87891 Personal history of nicotine dependence: Secondary | ICD-10-CM | POA: Diagnosis not present

## 2023-12-10 DIAGNOSIS — Z5111 Encounter for antineoplastic chemotherapy: Secondary | ICD-10-CM | POA: Diagnosis not present

## 2023-12-10 DIAGNOSIS — I959 Hypotension, unspecified: Secondary | ICD-10-CM | POA: Diagnosis not present

## 2023-12-10 DIAGNOSIS — I878 Other specified disorders of veins: Secondary | ICD-10-CM

## 2023-12-10 LAB — RAD ONC ARIA SESSION SUMMARY
Course Elapsed Days: 28
Plan Fractions Treated to Date: 21
Plan Prescribed Dose Per Fraction: 2 Gy
Plan Total Fractions Prescribed: 30
Plan Total Prescribed Dose: 60 Gy
Reference Point Dosage Given to Date: 42 Gy
Reference Point Session Dosage Given: 2 Gy
Session Number: 21

## 2023-12-10 LAB — CBC WITH DIFFERENTIAL (CANCER CENTER ONLY)
Abs Immature Granulocytes: 0.01 10*3/uL (ref 0.00–0.07)
Basophils Absolute: 0 10*3/uL (ref 0.0–0.1)
Basophils Relative: 1 %
Eosinophils Absolute: 0 10*3/uL (ref 0.0–0.5)
Eosinophils Relative: 1 %
HCT: 39.4 % (ref 39.0–52.0)
Hemoglobin: 13 g/dL (ref 13.0–17.0)
Immature Granulocytes: 0 %
Lymphocytes Relative: 29 %
Lymphs Abs: 0.8 10*3/uL (ref 0.7–4.0)
MCH: 31.5 pg (ref 26.0–34.0)
MCHC: 33 g/dL (ref 30.0–36.0)
MCV: 95.4 fL (ref 80.0–100.0)
Monocytes Absolute: 0.5 10*3/uL (ref 0.1–1.0)
Monocytes Relative: 20 %
Neutro Abs: 1.3 10*3/uL — ABNORMAL LOW (ref 1.7–7.7)
Neutrophils Relative %: 49 %
Platelet Count: 186 10*3/uL (ref 150–400)
RBC: 4.13 MIL/uL — ABNORMAL LOW (ref 4.22–5.81)
RDW: 15.9 % — ABNORMAL HIGH (ref 11.5–15.5)
WBC Count: 2.7 10*3/uL — ABNORMAL LOW (ref 4.0–10.5)
nRBC: 0 % (ref 0.0–0.2)

## 2023-12-10 LAB — CMP (CANCER CENTER ONLY)
ALT: 9 U/L (ref 0–44)
AST: 15 U/L (ref 15–41)
Albumin: 3.4 g/dL — ABNORMAL LOW (ref 3.5–5.0)
Alkaline Phosphatase: 64 U/L (ref 38–126)
Anion gap: 6 (ref 5–15)
BUN: 15 mg/dL (ref 8–23)
CO2: 27 mmol/L (ref 22–32)
Calcium: 8.9 mg/dL (ref 8.9–10.3)
Chloride: 105 mmol/L (ref 98–111)
Creatinine: 1.16 mg/dL (ref 0.61–1.24)
GFR, Estimated: >60 mL/min (ref >60)
Glucose, Bld: 157 mg/dL — ABNORMAL HIGH (ref 70–99)
Potassium: 4.2 mmol/L (ref 3.5–5.1)
Sodium: 138 mmol/L (ref 135–145)
Total Bilirubin: 0.5 mg/dL (ref 0.0–1.2)
Total Protein: 7 g/dL (ref 6.5–8.1)

## 2023-12-10 MED ORDER — PALONOSETRON HCL INJECTION 0.25 MG/5ML
0.2500 mg | Freq: Once | INTRAVENOUS | Status: AC
  Administered 2023-12-10: 0.25 mg via INTRAVENOUS
  Filled 2023-12-10: qty 5

## 2023-12-10 MED ORDER — DEXAMETHASONE SODIUM PHOSPHATE 10 MG/ML IJ SOLN
10.0000 mg | Freq: Once | INTRAMUSCULAR | Status: AC
  Administered 2023-12-10: 10 mg via INTRAVENOUS
  Filled 2023-12-10: qty 1

## 2023-12-10 MED ORDER — DIPHENHYDRAMINE HCL 50 MG/ML IJ SOLN
25.0000 mg | Freq: Once | INTRAMUSCULAR | Status: AC
  Administered 2023-12-10: 25 mg via INTRAVENOUS
  Filled 2023-12-10: qty 1

## 2023-12-10 MED ORDER — SODIUM CHLORIDE 0.9 % IV SOLN: INTRAVENOUS | Status: DC

## 2023-12-10 MED ORDER — FAMOTIDINE IN NACL 20-0.9 MG/50ML-% IV SOLN
20.0000 mg | Freq: Once | INTRAVENOUS | Status: AC
  Administered 2023-12-10: 20 mg via INTRAVENOUS
  Filled 2023-12-10: qty 50

## 2023-12-10 MED ORDER — SODIUM CHLORIDE 0.9 % IV SOLN
45.0000 mg/m2 | Freq: Once | INTRAVENOUS | Status: AC
  Administered 2023-12-10: 102 mg via INTRAVENOUS
  Filled 2023-12-10: qty 17

## 2023-12-10 MED ORDER — SODIUM CHLORIDE 0.9 % IV SOLN
165.8000 mg | Freq: Once | INTRAVENOUS | Status: AC
  Administered 2023-12-10: 170 mg via INTRAVENOUS
  Filled 2023-12-10: qty 17

## 2023-12-10 NOTE — Progress Notes (Signed)
 Okay to treat per Dr. Arbutus Ped with ANC 1.3.

## 2023-12-10 NOTE — Patient Instructions (Signed)
 [facility] - A DEPT OF [facility]  Discharge Instructions: Thank you for choosing [facility] to provide your oncology and hematology care.   If you have a lab appointment with the Cancer Center, please go directly to the Cancer Center and check in at the registration area.   Wear comfortable clothing and clothing appropriate for easy access to any Portacath or PICC line.   We strive to give you quality time with your provider. You may need to reschedule your appointment if you arrive late (15 or more minutes).  Arriving late affects you and other patients whose appointments are after yours.  Also, if you miss three or more appointments without notifying the office, you may be dismissed from the clinic at the provider's discretion.      For prescription refill requests, have your pharmacy contact our office and allow 72 hours for refills to be completed.    Today you received the following chemotherapy and/or immunotherapy agents: Paclitaxel and Carboplatin      To help prevent nausea and vomiting after your treatment, we encourage you to take your nausea medication as directed.  BELOW ARE SYMPTOMS THAT SHOULD BE REPORTED IMMEDIATELY: *FEVER GREATER THAN 100.4 F (38 C) OR HIGHER *CHILLS OR SWEATING *NAUSEA AND VOMITING THAT IS NOT CONTROLLED WITH YOUR NAUSEA MEDICATION *UNUSUAL SHORTNESS OF BREATH *UNUSUAL BRUISING OR BLEEDING *URINARY PROBLEMS (pain or burning when urinating, or frequent urination) *BOWEL PROBLEMS (unusual diarrhea, constipation, pain near the anus) TENDERNESS IN MOUTH AND THROAT WITH OR WITHOUT PRESENCE OF ULCERS (sore throat, sores in mouth, or a toothache) UNUSUAL RASH, SWELLING OR PAIN  UNUSUAL VAGINAL DISCHARGE OR ITCHING   Items with * indicate a potential emergency and should be followed up as soon as possible or go to the Emergency Department if any problems should occur.  Please show the CHEMOTHERAPY ALERT CARD  or IMMUNOTHERAPY ALERT CARD at check-in to the Emergency Department and triage nurse.  Should you have questions after your visit or need to cancel or reschedule your appointment, please contact [facility] - A DEPT [facility]  Dept: [phone]  and follow the prompts.  Office hours are 8:00 a.m. to 4:30 p.m. Monday - Friday. Please note that voicemails left after 4:00 p.m. may not be returned until the following business day.  We are closed weekends and major holidays. You have access to a nurse at all times for urgent questions. Please call the main number to the clinic Dept: [phone] and follow the prompts.   For any non-urgent questions, you may also contact your provider using MyChart. We now offer e-Visits for anyone 54 and older to request care online for non-urgent symptoms. For details visit [email or web address].   Also download the MyChart app! Go to the app store, search "MyChart", open the app, select [place], and log in with your MyChart username and password.

## 2023-12-11 ENCOUNTER — Ambulatory Visit
Admission: RE | Admit: 2023-12-11 | Discharge: 2023-12-11 | Disposition: A | Discharge status: Home / Self Care | Payer: Medicare Other | Source: Ambulatory Visit | Attending: Radiation Oncology | Admitting: Radiation Oncology

## 2023-12-11 ENCOUNTER — Other Ambulatory Visit: Payer: Self-pay

## 2023-12-11 DIAGNOSIS — I959 Hypotension, unspecified: Secondary | ICD-10-CM | POA: Diagnosis not present

## 2023-12-11 DIAGNOSIS — Z5111 Encounter for antineoplastic chemotherapy: Secondary | ICD-10-CM | POA: Diagnosis not present

## 2023-12-11 DIAGNOSIS — Z87891 Personal history of nicotine dependence: Secondary | ICD-10-CM | POA: Diagnosis not present

## 2023-12-11 DIAGNOSIS — Z510 Encounter for antineoplastic radiation therapy: Secondary | ICD-10-CM | POA: Diagnosis not present

## 2023-12-11 DIAGNOSIS — E278 Other specified disorders of adrenal gland: Secondary | ICD-10-CM | POA: Diagnosis not present

## 2023-12-11 DIAGNOSIS — C3412 Malignant neoplasm of upper lobe, left bronchus or lung: Secondary | ICD-10-CM | POA: Diagnosis not present

## 2023-12-11 DIAGNOSIS — D72819 Decreased white blood cell count, unspecified: Secondary | ICD-10-CM | POA: Diagnosis not present

## 2023-12-11 LAB — RAD ONC ARIA SESSION SUMMARY
Course Elapsed Days: 29
Plan Fractions Treated to Date: 22
Plan Prescribed Dose Per Fraction: 2 Gy
Plan Total Fractions Prescribed: 30
Plan Total Prescribed Dose: 60 Gy
Reference Point Dosage Given to Date: 44 Gy
Reference Point Session Dosage Given: 2 Gy
Session Number: 22

## 2023-12-12 ENCOUNTER — Ambulatory Visit
Admission: RE | Admit: 2023-12-12 | Discharge: 2023-12-12 | Disposition: A | Discharge status: Home / Self Care | Payer: Medicare Other | Source: Ambulatory Visit | Attending: Radiation Oncology

## 2023-12-12 ENCOUNTER — Other Ambulatory Visit: Payer: Medicare Other

## 2023-12-12 ENCOUNTER — Ambulatory Visit: Payer: Medicare Other

## 2023-12-12 ENCOUNTER — Other Ambulatory Visit: Payer: Self-pay

## 2023-12-12 DIAGNOSIS — C3412 Malignant neoplasm of upper lobe, left bronchus or lung: Secondary | ICD-10-CM | POA: Diagnosis not present

## 2023-12-12 DIAGNOSIS — I959 Hypotension, unspecified: Secondary | ICD-10-CM | POA: Diagnosis not present

## 2023-12-12 DIAGNOSIS — Z87891 Personal history of nicotine dependence: Secondary | ICD-10-CM | POA: Diagnosis not present

## 2023-12-12 DIAGNOSIS — Z5111 Encounter for antineoplastic chemotherapy: Secondary | ICD-10-CM | POA: Diagnosis not present

## 2023-12-12 DIAGNOSIS — Z510 Encounter for antineoplastic radiation therapy: Secondary | ICD-10-CM | POA: Diagnosis not present

## 2023-12-12 DIAGNOSIS — E278 Other specified disorders of adrenal gland: Secondary | ICD-10-CM | POA: Diagnosis not present

## 2023-12-12 DIAGNOSIS — D72819 Decreased white blood cell count, unspecified: Secondary | ICD-10-CM | POA: Diagnosis not present

## 2023-12-12 LAB — RAD ONC ARIA SESSION SUMMARY
Course Elapsed Days: 30
Plan Fractions Treated to Date: 23
Plan Prescribed Dose Per Fraction: 2 Gy
Plan Total Fractions Prescribed: 30
Plan Total Prescribed Dose: 60 Gy
Reference Point Dosage Given to Date: 46 Gy
Reference Point Session Dosage Given: 2 Gy
Session Number: 23

## 2023-12-13 ENCOUNTER — Other Ambulatory Visit: Payer: Self-pay

## 2023-12-13 ENCOUNTER — Ambulatory Visit
Admission: RE | Admit: 2023-12-13 | Discharge: 2023-12-13 | Disposition: A | Discharge status: Home / Self Care | Payer: Medicare Other | Source: Ambulatory Visit | Attending: Radiation Oncology | Admitting: Radiation Oncology

## 2023-12-13 ENCOUNTER — Ambulatory Visit
Admission: RE | Admit: 2023-12-13 | Discharge: 2023-12-13 | Disposition: A | Discharge status: Home / Self Care | Source: Ambulatory Visit | Attending: Radiation Oncology | Admitting: Radiation Oncology

## 2023-12-13 DIAGNOSIS — Z510 Encounter for antineoplastic radiation therapy: Secondary | ICD-10-CM | POA: Diagnosis not present

## 2023-12-13 DIAGNOSIS — Z5111 Encounter for antineoplastic chemotherapy: Secondary | ICD-10-CM | POA: Diagnosis not present

## 2023-12-13 DIAGNOSIS — I959 Hypotension, unspecified: Secondary | ICD-10-CM | POA: Diagnosis not present

## 2023-12-13 DIAGNOSIS — C3412 Malignant neoplasm of upper lobe, left bronchus or lung: Secondary | ICD-10-CM | POA: Diagnosis not present

## 2023-12-13 DIAGNOSIS — E278 Other specified disorders of adrenal gland: Secondary | ICD-10-CM | POA: Diagnosis not present

## 2023-12-13 DIAGNOSIS — D72819 Decreased white blood cell count, unspecified: Secondary | ICD-10-CM | POA: Diagnosis not present

## 2023-12-13 DIAGNOSIS — Z87891 Personal history of nicotine dependence: Secondary | ICD-10-CM | POA: Diagnosis not present

## 2023-12-13 LAB — RAD ONC ARIA SESSION SUMMARY
Course Elapsed Days: 31
Plan Fractions Treated to Date: 24
Plan Prescribed Dose Per Fraction: 2 Gy
Plan Total Fractions Prescribed: 30
Plan Total Prescribed Dose: 60 Gy
Reference Point Dosage Given to Date: 48 Gy
Reference Point Session Dosage Given: 2 Gy
Session Number: 24

## 2023-12-16 ENCOUNTER — Inpatient Hospital Stay: Payer: Medicare Other

## 2023-12-16 ENCOUNTER — Ambulatory Visit
Admission: RE | Admit: 2023-12-16 | Discharge: 2023-12-16 | Disposition: A | Discharge status: Home / Self Care | Payer: Medicare Other | Source: Ambulatory Visit | Attending: Radiation Oncology

## 2023-12-16 ENCOUNTER — Other Ambulatory Visit: Payer: Self-pay

## 2023-12-16 VITALS — BP 142/92 | HR 93 | Temp 97.9°F | Resp 20 | Wt 213.0 lb

## 2023-12-16 DIAGNOSIS — Z87891 Personal history of nicotine dependence: Secondary | ICD-10-CM | POA: Diagnosis not present

## 2023-12-16 DIAGNOSIS — I959 Hypotension, unspecified: Secondary | ICD-10-CM | POA: Diagnosis not present

## 2023-12-16 DIAGNOSIS — C3412 Malignant neoplasm of upper lobe, left bronchus or lung: Secondary | ICD-10-CM

## 2023-12-16 DIAGNOSIS — E278 Other specified disorders of adrenal gland: Secondary | ICD-10-CM | POA: Diagnosis not present

## 2023-12-16 DIAGNOSIS — D72819 Decreased white blood cell count, unspecified: Secondary | ICD-10-CM | POA: Diagnosis not present

## 2023-12-16 DIAGNOSIS — Z510 Encounter for antineoplastic radiation therapy: Secondary | ICD-10-CM | POA: Diagnosis not present

## 2023-12-16 DIAGNOSIS — Z5111 Encounter for antineoplastic chemotherapy: Secondary | ICD-10-CM | POA: Diagnosis not present

## 2023-12-16 LAB — CMP (CANCER CENTER ONLY)
ALT: 10 U/L (ref 0–44)
AST: 15 U/L (ref 15–41)
Albumin: 3.4 g/dL — ABNORMAL LOW (ref 3.5–5.0)
Alkaline Phosphatase: 56 U/L (ref 38–126)
Anion gap: 6 (ref 5–15)
BUN: 13 mg/dL (ref 8–23)
CO2: 29 mmol/L (ref 22–32)
Calcium: 8.9 mg/dL (ref 8.9–10.3)
Chloride: 107 mmol/L (ref 98–111)
Creatinine: 1.05 mg/dL (ref 0.61–1.24)
GFR, Estimated: >60 mL/min (ref >60)
Glucose, Bld: 80 mg/dL (ref 70–99)
Potassium: 3.7 mmol/L (ref 3.5–5.1)
Sodium: 142 mmol/L (ref 135–145)
Total Bilirubin: 0.5 mg/dL (ref 0.0–1.2)
Total Protein: 6.9 g/dL (ref 6.5–8.1)

## 2023-12-16 LAB — RAD ONC ARIA SESSION SUMMARY
Course Elapsed Days: 34
Plan Fractions Treated to Date: 25
Plan Prescribed Dose Per Fraction: 2 Gy
Plan Total Fractions Prescribed: 30
Plan Total Prescribed Dose: 60 Gy
Reference Point Dosage Given to Date: 50 Gy
Reference Point Session Dosage Given: 2 Gy
Session Number: 25

## 2023-12-16 LAB — CBC WITH DIFFERENTIAL (CANCER CENTER ONLY)
Abs Immature Granulocytes: 0.01 10*3/uL (ref 0.00–0.07)
Basophils Absolute: 0 10*3/uL (ref 0.0–0.1)
Basophils Relative: 1 %
Eosinophils Absolute: 0.1 10*3/uL (ref 0.0–0.5)
Eosinophils Relative: 2 %
HCT: 37.3 % — ABNORMAL LOW (ref 39.0–52.0)
Hemoglobin: 12.3 g/dL — ABNORMAL LOW (ref 13.0–17.0)
Immature Granulocytes: 1 %
Lymphocytes Relative: 34 %
Lymphs Abs: 0.7 10*3/uL (ref 0.7–4.0)
MCH: 30.9 pg (ref 26.0–34.0)
MCHC: 33 g/dL (ref 30.0–36.0)
MCV: 93.7 fL (ref 80.0–100.0)
Monocytes Absolute: 0.2 10*3/uL (ref 0.1–1.0)
Monocytes Relative: 9 %
Neutro Abs: 1.2 10*3/uL — ABNORMAL LOW (ref 1.7–7.7)
Neutrophils Relative %: 53 %
Platelet Count: 142 10*3/uL — ABNORMAL LOW (ref 150–400)
RBC: 3.98 MIL/uL — ABNORMAL LOW (ref 4.22–5.81)
RDW: 15.5 % (ref 11.5–15.5)
WBC Count: 2.2 10*3/uL — ABNORMAL LOW (ref 4.0–10.5)
nRBC: 0 % (ref 0.0–0.2)

## 2023-12-16 MED ORDER — SODIUM CHLORIDE 0.9 % IV SOLN: INTRAVENOUS | Status: DC

## 2023-12-16 MED ORDER — SODIUM CHLORIDE 0.9 % IV SOLN
165.8000 mg | Freq: Once | INTRAVENOUS | Status: AC
  Administered 2023-12-16: 170 mg via INTRAVENOUS
  Filled 2023-12-16: qty 17

## 2023-12-16 MED ORDER — FAMOTIDINE IN NACL 20-0.9 MG/50ML-% IV SOLN
20.0000 mg | Freq: Once | INTRAVENOUS | Status: AC
  Administered 2023-12-16: 20 mg via INTRAVENOUS
  Filled 2023-12-16: qty 50

## 2023-12-16 MED ORDER — DEXAMETHASONE SODIUM PHOSPHATE 10 MG/ML IJ SOLN
10.0000 mg | Freq: Once | INTRAMUSCULAR | Status: AC
  Administered 2023-12-16: 10 mg via INTRAVENOUS
  Filled 2023-12-16: qty 1

## 2023-12-16 MED ORDER — SODIUM CHLORIDE 0.9 % IV SOLN
45.0000 mg/m2 | Freq: Once | INTRAVENOUS | Status: AC
  Administered 2023-12-16: 102 mg via INTRAVENOUS
  Filled 2023-12-16: qty 17

## 2023-12-16 MED ORDER — DIPHENHYDRAMINE HCL 50 MG/ML IJ SOLN
25.0000 mg | Freq: Once | INTRAMUSCULAR | Status: AC
Start: 2023-12-16 — End: 2023-12-16
  Administered 2023-12-16: 25 mg via INTRAVENOUS
  Filled 2023-12-16: qty 1

## 2023-12-16 MED ORDER — PALONOSETRON HCL INJECTION 0.25 MG/5ML
0.2500 mg | Freq: Once | INTRAVENOUS | Status: AC
  Administered 2023-12-16: 0.25 mg via INTRAVENOUS
  Filled 2023-12-16: qty 5

## 2023-12-16 NOTE — Patient Instructions (Signed)
 [facility] - A DEPT OF [facility]  Discharge Instructions: Thank you for choosing [facility] to provide your oncology and hematology care.   If you have a lab appointment with the Cancer Center, please go directly to the Cancer Center and check in at the registration area.   Wear comfortable clothing and clothing appropriate for easy access to any Portacath or PICC line.   We strive to give you quality time with your provider. You may need to reschedule your appointment if you arrive late (15 or more minutes).  Arriving late affects you and other patients whose appointments are after yours.  Also, if you miss three or more appointments without notifying the office, you may be dismissed from the clinic at the provider's discretion.      For prescription refill requests, have your pharmacy contact our office and allow 72 hours for refills to be completed.    Today you received the following chemotherapy and/or immunotherapy agents CARBOplatin (PARAPLATIN) and PACLitaxel (TAXOL)       To help prevent nausea and vomiting after your treatment, we encourage you to take your nausea medication as directed.  BELOW ARE SYMPTOMS THAT SHOULD BE REPORTED IMMEDIATELY: *FEVER GREATER THAN 100.4 F (38 C) OR HIGHER *CHILLS OR SWEATING *NAUSEA AND VOMITING THAT IS NOT CONTROLLED WITH YOUR NAUSEA MEDICATION *UNUSUAL SHORTNESS OF BREATH *UNUSUAL BRUISING OR BLEEDING *URINARY PROBLEMS (pain or burning when urinating, or frequent urination) *BOWEL PROBLEMS (unusual diarrhea, constipation, pain near the anus) TENDERNESS IN MOUTH AND THROAT WITH OR WITHOUT PRESENCE OF ULCERS (sore throat, sores in mouth, or a toothache) UNUSUAL RASH, SWELLING OR PAIN  UNUSUAL VAGINAL DISCHARGE OR ITCHING   Items with * indicate a potential emergency and should be followed up as soon as possible or go to the Emergency Department if any problems should occur.  Please show the  CHEMOTHERAPY ALERT CARD or IMMUNOTHERAPY ALERT CARD at check-in to the Emergency Department and triage nurse.  Should you have questions after your visit or need to cancel or reschedule your appointment, please contact [facility] - A DEPT OF [facility]  Dept: [phone]  and follow the prompts.  Office hours are 8:00 a.m. to 4:30 p.m. Monday - Friday. Please note that voicemails left after 4:00 p.m. may not be returned until the following business day.  We are closed weekends and major holidays. You have access to a nurse at all times for urgent questions. Please call the main number to the clinic Dept: [phone] and follow the prompts.   For any non-urgent questions, you may also contact your provider using MyChart. We now offer e-Visits for anyone 23 and older to request care online for non-urgent symptoms. For details visit [email or web address].   Also download the MyChart app! Go to the app store, search "MyChart", open the app, select [facility], and log in with your MyChart username and password.

## 2023-12-16 NOTE — Progress Notes (Signed)
 Per Dr. Arbutus Ped , it is ok to treat pt today with Carboplatin and taxol and anc= 1.2. Sumner Boast, RN notified.

## 2023-12-17 ENCOUNTER — Other Ambulatory Visit: Payer: Self-pay

## 2023-12-17 ENCOUNTER — Ambulatory Visit (INDEPENDENT_AMBULATORY_CARE_PROVIDER_SITE_OTHER): Payer: Medicare Other

## 2023-12-17 ENCOUNTER — Ambulatory Visit: Payer: Medicare Other | Admitting: Internal Medicine

## 2023-12-17 ENCOUNTER — Ambulatory Visit
Admission: RE | Admit: 2023-12-17 | Discharge: 2023-12-17 | Disposition: A | Discharge status: Home / Self Care | Payer: Medicare Other | Source: Ambulatory Visit | Attending: Radiation Oncology | Admitting: Radiation Oncology

## 2023-12-17 ENCOUNTER — Other Ambulatory Visit: Payer: Medicare Other

## 2023-12-17 ENCOUNTER — Ambulatory Visit: Payer: Medicare Other

## 2023-12-17 VITALS — Ht 74.0 in | Wt 220.0 lb

## 2023-12-17 DIAGNOSIS — Z510 Encounter for antineoplastic radiation therapy: Secondary | ICD-10-CM | POA: Insufficient documentation

## 2023-12-17 DIAGNOSIS — Z87891 Personal history of nicotine dependence: Secondary | ICD-10-CM | POA: Diagnosis not present

## 2023-12-17 DIAGNOSIS — Z0000 Encounter for general adult medical examination without abnormal findings: Secondary | ICD-10-CM

## 2023-12-17 DIAGNOSIS — C3412 Malignant neoplasm of upper lobe, left bronchus or lung: Secondary | ICD-10-CM | POA: Diagnosis not present

## 2023-12-17 LAB — RAD ONC ARIA SESSION SUMMARY
Course Elapsed Days: 35
Plan Fractions Treated to Date: 26
Plan Prescribed Dose Per Fraction: 2 Gy
Plan Total Fractions Prescribed: 30
Plan Total Prescribed Dose: 60 Gy
Reference Point Dosage Given to Date: 52 Gy
Reference Point Session Dosage Given: 2 Gy
Session Number: 26

## 2023-12-17 NOTE — Progress Notes (Signed)
 [facility] OFFICE PROGRESS NOTE  Wanda Plump, MD [address] [place] [address]  DIAGNOSIS: Stage IIIa (T3, N2, M0) non-small cell lung cancer, squamous cell carcinoma presented with left upper lobe lung mass abutting the pleura and ipsilateral mediastinal and left hilar lymph nodes. The left adrenal nodule is mildly FDG avid and indeterminate. Diagnosed in January 2025.   PRIOR THERAPY: None  CURRENT THERAPY: Concurrent chemoradiation with carboplatin for an AUC of 2 and paclitaxel 45 mg/m.  First dose on 11/19/23. Status post 5 cycles.   INTERVAL HISTORY: Jonathan [name]. 88 y.o. male returns to the clinic today for a follow-up visit.  The patient is currently undergoing a course of concurrent chemoradiation.  The patient status post 5 cycles and has been tolerating it fairly well except for a skin burn on his back for which she was given cream by radiation oncology.  He also has some pain and difficulty swallowing.  The patient's last day radiation is tentatively scheduled for later this week on 12/26/23.  Therefore, today will be his last cycle of treatment if labs permit.  He is neutropenic today. He denies signs or symptoms of infection at this time. He denies any fever, chills, or night sweats. Her chart review, it looks like the patient has lost weight.  The patient reports that overall his breathing is "not an issue for him".  He does have supplemental oxygen at home that he may need to wear during the day as needed.  Overall he checks his oxygen at home and reports that his oxygen saturation is generally in the mid 90% range.  The patient reports a similar cough that he has been having throughout the course of treatment/radiation that is unchanged.  He has tried Delsym which helps somewhat.  He denies any hemoptysis or chest pain.   Denies any nausea, vomiting, diarrhea, or constipation.  Denies any headache or visual changes.  He is here today for  evaluation and repeat blood work before undergoing cycle #6 which is scheduled for tomorrow.   MEDICAL HISTORY: Past Medical History:  Diagnosis Date   CAD (coronary artery disease)    Two RCA stents remotely / 3rd RCA stent 2006   Colon polyps    s/p several Cscopes.   COPD (chronic obstructive pulmonary disease) (HCC)    on O2, nocturnal   Diabetes mellitus    dx aprox 2009   ED (erectile dysfunction)    has a vacumm device   Ejection fraction    Hyperlipidemia    dx in 90s   Hypertension    dx in the 36   Hypogonadism male    PVD (peripheral vascular disease) (HCC)    s/p stents at LE 2009, Dr Jacinto Halim   Shortness of breath    O2 Sat dropped to 82% walking on the treadmill, September, 2012    ALLERGIES:  has no known allergies.  MEDICATIONS:  Current Outpatient Medications  Medication Sig Dispense Refill   albuterol (VENTOLIN HFA) 108 (90 Base) MCG/ACT inhaler USE 1 TO 2 INHALATIONS EVERY 6 HOURS AS NEEDED FOR WHEEZING OR SHORTNESS OF BREATH 34 g 3   Blood Glucose Monitoring Suppl (FREESTYLE FREEDOM LITE) w/Device KIT Use Freestyle Freedom Lite meter to check blood sugar twice daily. DX:E11.65 1 kit 0   clopidogrel (PLAVIX) 75 MG tablet Take 1 tablet (75 mg total) by mouth daily with breakfast. 90 tablet 1   Dulaglutide (TRULICITY) 3 MG/0.5ML SOAJ  INJECT 3 MG UNDER THE SKIN ONCE A WEEK 6 mL 3   empagliflozin (JARDIANCE) 25 MG TABS tablet Take 1 tablet (25 mg total) by mouth daily before breakfast. For diabetes E11.65 and E 11.29 90 tablet 3   furosemide (LASIX) 40 MG tablet Take 1 tablet (40 mg total) by mouth daily. 90 tablet 1   gabapentin (NEURONTIN) 100 MG capsule Take 100 mg by mouth 2 (two) times daily.     glucose blood (FREESTYLE LITE) test strip USE AS INSTRUCTED 100 strip 11   hydrOXYzine (ATARAX) 25 MG tablet Take 1 tablet by mouth at bedtime as needed.     insulin glargine (LANTUS SOLOSTAR) 100 UNIT/ML Solostar Pen Inject 15 Units into the skin daily. 15 mL 11    Insulin Pen Needle 32G X 4 MM MISC Use 1 x a day 100 each 3   metFORMIN (GLUCOPHAGE) 500 MG tablet Take 1 tablet (500 mg total) by mouth 2 (two) times daily with a meal. 180 tablet 3   metoprolol (TOPROL-XL) 200 MG 24 hr tablet Take 100 mg by mouth daily.     Multiple Vitamin (MULTIVITAMIN WITH MINERALS) TABS tablet Take 1 tablet by mouth daily.     ondansetron (ZOFRAN) 8 MG tablet Take 1 tablet (8 mg total) by mouth every 8 (eight) hours as needed for nausea or vomiting. Start on the third day after chemotherapy. 30 tablet 1   OXYGEN Inhale 2 L/min into the lungs at bedtime. PRN during day     prochlorperazine (COMPAZINE) 10 MG tablet Take 1 tablet (10 mg total) by mouth every 6 (six) hours as needed for nausea or vomiting. 30 tablet 1   sacubitril-valsartan (ENTRESTO) 24-26 MG Take 1 tablet by mouth 2 (two) times daily. 60 tablet 0   simvastatin (ZOCOR) 20 MG tablet Take 20 mg by mouth daily.     sucralfate (CARAFATE) 1 g tablet Take 1 tablet (1 g total) by mouth 4 (four) times daily -  with meals and at bedtime. 5 min before meals for radiation induced esophagitis 120 tablet 2   tamsulosin (FLOMAX) 0.4 MG CAPS capsule Take 1 capsule (0.4 mg total) by mouth daily after supper. 90 capsule 1   Tiotropium Bromide-Olodaterol (STIOLTO RESPIMAT) 2.5-2.5 MCG/ACT AERS Inhale 2 puffs into the lungs daily. 12 g 4   No current facility-administered medications for this visit.    SURGICAL HISTORY:  Past Surgical History:  Procedure Laterality Date   APPENDECTOMY     BRONCHIAL BIOPSY  10/01/2023   Procedure: BRONCHIAL BIOPSIES;  Surgeon: Josephine Igo, DO;  Location: [facility];  Service: Pulmonary;;   BRONCHIAL BRUSHINGS  10/01/2023   Procedure: BRONCHIAL BRUSHINGS;  Surgeon: Josephine Igo, DO;  Location: [facility];  Service: Pulmonary;;   BRONCHIAL WASHINGS  10/01/2023   Procedure: BRONCHIAL WASHINGS;  Surgeon: Josephine Igo, DO;  Location: [facility];  Service: Pulmonary;;   CORONARY  STENT INTERVENTION N/A 01/12/2022   Procedure: CORONARY STENT INTERVENTION;  Surgeon: Swaziland, Peter M, MD;  Location: [facility]AS[facility] CV LAB;  Service: Cardiovascular;  Laterality: N/A;   HEMOSTASIS CONTROL  10/01/2023   Procedure: HEMOSTASIS CONTROL;  Surgeon: Josephine Igo, DO;  Location: [facility];  Service: Pulmonary;;   LEFT HEART CATH AND CORONARY ANGIOGRAPHY N/A 03/13/2023   Procedure: LEFT HEART CATH AND CORONARY ANGIOGRAPHY;  Surgeon: Swaziland, Peter M, MD;  Location: [facility] INVASIVE CV LAB;  Service: Cardiovascular;  Laterality: N/A;   RIGHT/LEFT HEART CATH AND CORONARY ANGIOGRAPHY N/A 01/11/2022  Procedure: RIGHT/LEFT HEART CATH AND CORONARY ANGIOGRAPHY;  Surgeon: Lyn Records, MD;  [facility] INVASIVE CV LAB;  Service: Cardiovascular;  Laterality: N/A;   TONSILLECTOMY     VIDEO BRONCHOSCOPY  10/01/2023   Procedure: VIDEO BRONCHOSCOPY WITHOUT FLUORO;  Surgeon: Josephine Igo, DO;  Location: [facility];  Service: Pulmonary;;    REVIEW OF SYSTEMS:   Constitutional: Negative for appetite change, chills, fatigue, fever and unexpected weight change.  HENT: Positive for odynophagia and dysphagia. Negative for mouth sores, nosebleeds, sore throat.  Eyes: Negative for eye problems and icterus.  Respiratory: Positive for stable cough and stable mild intermittent dyspnea on exertion. Negative for  hemoptysis and wheezing.   Cardiovascular: Negative for chest pain and leg swelling.  Gastrointestinal: Negative for abdominal pain, constipation, diarrhea, nausea and vomiting.  Genitourinary: Negative for bladder incontinence, difficulty urinating, dysuria, frequency and hematuria.   Musculoskeletal: Negative for back pain, gait problem, neck pain and neck stiffness.  Skin: Positive for skin burn on his back.  Neurological: Negative for dizziness, extremity weakness, gait problem, headaches, light-headedness and seizures.  Hematological: Negative for adenopathy. Does not bruise/bleed easily.   Psychiatric/Behavioral: Negative for confusion, depression and sleep disturbance. The patient is not nervous/anxious.     PHYSICAL EXAMINATION:  Blood pressure (!) 146/82, pulse 90, temperature (!) 97.3 F (36.3 C), temperature source Temporal, resp. rate 18, weight 213 lb 1.6 oz (96.7 kg), SpO2 100%.  ECOG PERFORMANCE STATUS: 1  Physical Exam  Constitutional: Oriented to person, place, and time and well-developed, well-nourished, and in no distress.  HENT:  Head: Normocephalic and atraumatic.  Mouth/Throat: Oropharynx is clear and moist. No oropharyngeal exudate.  Eyes: Conjunctivae are normal. Right eye exhibits no discharge. Left eye exhibits no discharge. No scleral icterus.  Neck: Normal range of motion. Neck supple.  Cardiovascular: Normal rate, regular rhythm, normal heart sounds and intact distal pulses.   Pulmonary/Chest: Effort normal and breath sounds normal. No respiratory distress. No wheezes. No rales.  Abdominal: Soft. Bowel sounds are normal. Exhibits no distension and no mass. There is no tenderness.  Musculoskeletal: Normal range of motion. Exhibits no edema.  Lymphadenopathy:    No cervical adenopathy.  Neurological: Alert and oriented to person, place, and time. Exhibits normal muscle tone. Gait normal. Coordination normal.  Skin: Skin is warm and dry. Not diaphoretic. No erythema. No pallor.  Psychiatric: Mood, memory and judgment normal.  Vitals reviewed.  LABORATORY DATA: Lab Results  Component Value Date   WBC 1.7 (L) 12/23/2023   HGB 11.9 (L) 12/23/2023   HCT 34.8 (L) 12/23/2023   MCV 89.7 12/23/2023   PLT 139 (L) 12/23/2023      Chemistry      Component Value Date/Time   NA 139 12/23/2023 1255   NA 144 03/05/2023 0955   K 3.3 (L) 12/23/2023 1255   CL 108 12/23/2023 1255   CO2 28 12/23/2023 1255   BUN 12 12/23/2023 1255   BUN 17 03/05/2023 0955   CREATININE 0.94 12/23/2023 1255   CREATININE 1.04 11/28/2023 0803      Component Value  Date/Time   CALCIUM 8.8 (L) 12/23/2023 1255   ALKPHOS 64 12/23/2023 1255   AST 14 (L) 12/23/2023 1255   ALT 10 12/23/2023 1255   BILITOT 0.7 12/23/2023 1255       RADIOGRAPHIC STUDIES:  No results found.   ASSESSMENT/PLAN:  This is a very pleasant 88 year old African-American male recently diagnosed with Stage IIIa (T3, N2, M0) non-small cell lung cancer,  squamous cell carcinoma presented with left upper lobe lung mass abutting the pleura and ipsilateral mediastinal and left hilar lymph nodes. The left adrenal nodule is mildly FDG avid and indeterminate. Diagnosed in January 2025.    The patient was seen with Dr. Arbutus Ped.  Dr. Arbutus Ped personally and independently reviewed the scan results and discussed results with the patient today.     Currently undergoing a course of concurrent chemoradiation with carboplatin for an AUC of 2 and paclitaxel 45 mg/m.  He is status post 5 cycles of treatment.  His last day radiation is tentatively scheduled for 12/26/23.  The patient's total white blood cell count is low at 1.7 and his ANC is low at 0.9.  His labs are not within parameters for treatment tomorrow.  Therefore I will cancel treatment.  Since his last day of radiation is later this week on 12/26/2023, we will not reschedule his chemotherapy as he will complete radiation later this week.  Therefore I will arrange for restaging CT scan of the chest only approximately 3 weeks after his last radiation.  We will then see him back the following week in the office to review the results.  Because of his neutropenia I did review neutropenic precautions with the patient.  He denies any signs and symptoms of infection at this time.  He understands that if he develops any signs and symptoms of infection that he needs to be evaluated by her provider.  I also reinforced masking, avoiding sick contacts, and washing fruits and vegetables thoroughly.  We will monitor the indeterminate left adrenal nodule on  future imaging studies.   Advised to use Robitussin for cough.  He will continue using his lotion for his burn.  He will continue to use Carafate for his swallowing  The patient was advised to call immediately if he has any concerning symptoms in the interval. The patient voices understanding of current disease status and treatment options and is in agreement with the current care plan. All questions were answered. The patient knows to call the clinic with any problems, questions or concerns. We can certainly see the patient much sooner if necessary      Orders Placed This Encounter  Procedures   CT Chest W Contrast    Standing Status:   Future    Expected Date:   01/16/2024    Expiration Date:   12/22/2024    If indicated for the ordered procedure, I authorize the administration of contrast media per Radiology protocol:   Yes    Does the patient have a contrast media/X-ray dye allergy?:   No    Preferred imaging location?:   [facility]   CBC with Differential (Cancer Center Only)    Standing Status:   Future    Expected Date:   01/16/2024    Expiration Date:   12/22/2024   CMP (Cancer Center only)    Standing Status:   Future    Expected Date:   01/16/2024    Expiration Date:   12/22/2024    The total time spent in the appointment was 20-29 minutes  Jhanvi Drakeford L Aliya Sol, PA-C 12/23/23

## 2023-12-17 NOTE — Progress Notes (Signed)
 Subjective:   Jonathan [name]. is a 88 y.o. who presents for a Medicare Wellness preventive visit.  Visit Complete: Virtual I connected with  Jonathan [name]. on 12/17/23 by a audio enabled telemedicine application and verified that I am speaking with the correct person using two identifiers.  Patient Location: Home  Provider Location: Home Office  I discussed the limitations of evaluation and management by telemedicine. The patient expressed understanding and agreed to proceed.   Vital Signs: Because this visit was a virtual/telehealth visit, some criteria may be missing or patient reported. Any vitals not documented were not able to be obtained and vitals that have been documented are patient reported.    Persons Participating in Visit: Patient.  AWV Questionnaire: No: Patient Medicare AWV questionnaire was not completed prior to this visit.  Cardiac Risk Factors include: advanced age (>20men, >23 women);male gender;diabetes mellitus;hypertension     Objective:    Today's Vitals   12/17/23 0933 12/17/23 0934  Weight: 220 lb (99.8 kg)   Height: 6\' 2"  (1.88 m)   PainSc:  0-No pain   Body mass index is 28.25 kg/m.     12/17/2023    9:40 AM 11/11/2023    4:02 PM 10/15/2023    2:34 PM 09/30/2023    1:30 PM 09/08/2023    9:49 PM 03/13/2023    8:20 AM 02/21/2023    6:00 PM  Advanced Directives  Does Patient Have a Medical Advance Directive? Yes No No No No Yes Yes  Type of Estate agent of [place];Living will     Living will;Healthcare Power of Attorney Living will  Does patient want to make changes to medical advance directive?      No - Patient declined No - Patient declined  Copy of Healthcare Power of Attorney in Chart? No - copy requested     No - copy requested   Would patient like information on creating a medical advance directive?  No - Patient declined No - Patient declined  No - Patient declined      Current Medications  (verified) Outpatient Encounter Medications as of 12/17/2023  Medication Sig   albuterol (VENTOLIN HFA) 108 (90 Base) MCG/ACT inhaler USE 1 TO 2 INHALATIONS EVERY 6 HOURS AS NEEDED FOR WHEEZING OR SHORTNESS OF BREATH   Blood Glucose Monitoring Suppl (FREESTYLE FREEDOM LITE) w/Device KIT Use Freestyle Freedom Lite meter to check blood sugar twice daily. DX:E11.65   clopidogrel (PLAVIX) 75 MG tablet Take 1 tablet (75 mg total) by mouth daily with breakfast.   Dulaglutide (TRULICITY) 3 MG/0.5ML SOAJ INJECT 3 MG UNDER THE SKIN ONCE A WEEK   empagliflozin (JARDIANCE) 25 MG TABS tablet Take 1 tablet (25 mg total) by mouth daily before breakfast. For diabetes E11.65 and E 11.29   furosemide (LASIX) 40 MG tablet Take 1 tablet (40 mg total) by mouth daily.   gabapentin (NEURONTIN) 100 MG capsule Take 100 mg by mouth 2 (two) times daily.   glucose blood (FREESTYLE LITE) test strip USE AS INSTRUCTED   hydrOXYzine (ATARAX) 25 MG tablet Take 1 tablet by mouth at bedtime as needed.   insulin glargine (LANTUS SOLOSTAR) 100 UNIT/ML Solostar Pen Inject 15 Units into the skin daily.   Insulin Pen Needle 32G X 4 MM MISC Use 1 x a day   metFORMIN (GLUCOPHAGE) 500 MG tablet Take 1 tablet (500 mg total) by mouth 2 (two) times daily with a meal.   metoprolol (TOPROL-XL) 200 MG 24 hr  tablet Take 100 mg by mouth daily.   Multiple Vitamin (MULTIVITAMIN WITH MINERALS) TABS tablet Take 1 tablet by mouth daily.   ondansetron (ZOFRAN) 8 MG tablet Take 1 tablet (8 mg total) by mouth every 8 (eight) hours as needed for nausea or vomiting. Start on the third day after chemotherapy.   OXYGEN Inhale 2 L/min into the lungs at bedtime. PRN during day   prochlorperazine (COMPAZINE) 10 MG tablet Take 1 tablet (10 mg total) by mouth every 6 (six) hours as needed for nausea or vomiting.   sacubitril-valsartan (ENTRESTO) 24-26 MG Take 1 tablet by mouth 2 (two) times daily.   simvastatin (ZOCOR) 20 MG tablet Take 20 mg by mouth daily.    sucralfate (CARAFATE) 1 g tablet Take 1 tablet (1 g total) by mouth 4 (four) times daily -  with meals and at bedtime. 5 min before meals for radiation induced esophagitis   tamsulosin (FLOMAX) 0.4 MG CAPS capsule Take 1 capsule (0.4 mg total) by mouth daily after supper.   Tiotropium Bromide-Olodaterol (STIOLTO RESPIMAT) 2.5-2.5 MCG/ACT AERS Inhale 2 puffs into the lungs daily.   No facility-administered encounter medications on file as of 12/17/2023.    Allergies (verified) Patient has no known allergies.   History: Past Medical History:  Diagnosis Date   CAD (coronary artery disease)    Two RCA stents remotely / 3rd RCA stent 2006   Colon polyps    s/p several Cscopes.   COPD (chronic obstructive pulmonary disease) (HCC)    on O2, nocturnal   Diabetes mellitus    dx aprox 2009   ED (erectile dysfunction)    has a vacumm device   Ejection fraction    Hyperlipidemia    dx in 90s   Hypertension    dx in the 75   Hypogonadism male    PVD (peripheral vascular disease) (HCC)    s/p stents at LE 2009, Dr Jacinto Halim   Shortness of breath    O2 Sat dropped to 82% walking on the treadmill, September, 2012   Past Surgical History:  Procedure Laterality Date   APPENDECTOMY     BRONCHIAL BIOPSY  10/01/2023   Procedure: BRONCHIAL BIOPSIES;  Surgeon: Josephine Igo, DO;  Location: [facility];  Service: Pulmonary;;   BRONCHIAL BRUSHINGS  10/01/2023   Procedure: BRONCHIAL BRUSHINGS;  Surgeon: Josephine Igo, DO;  Location: [facility];  Service: Pulmonary;;   BRONCHIAL WASHINGS  10/01/2023   Procedure: BRONCHIAL WASHINGS;  Surgeon: Josephine Igo, DO;  Location: [facility];  Service: Pulmonary;;   CORONARY STENT INTERVENTION N/A 01/12/2022   Procedure: CORONARY STENT INTERVENTION;  Surgeon: Swaziland, Peter M, MD;  Location: [facility] INVASIVE CV LAB;  Service: Cardiovascular;  Laterality: N/A;   HEMOSTASIS CONTROL  10/01/2023   Procedure: HEMOSTASIS CONTROL;  Surgeon: Josephine Igo, DO;   Location: [facility];  Service: Pulmonary;;   LEFT HEART CATH AND CORONARY ANGIOGRAPHY N/A 03/13/2023   Procedure: LEFT HEART CATH AND CORONARY ANGIOGRAPHY;  Surgeon: Swaziland, Peter M, MD;  [facility] INVAS[facility] CV LAB;  Service: Cardiovascular;  Laterality: N/A;   RIGHT/LEFT HEART CATH AND CORONARY ANGIOGRAPHY N/A 01/11/2022   Procedure: RIGHT/LEFT HEART CATH AND CORONARY ANGIOGRAPHY;  Surgeon: Lyn Records, MD;  Location: [facility]AS[facility];  Service: Cardiovascular;  Laterality: N/A;   TONSILLECTOMY     VIDEO BRONCHOSCOPY  10/01/2023   Procedure: VIDEO BRONCHOSCOPY WITHOUT FLUORO;  Surgeon: Josephine Igo, DO;  Location: [facility];  Service: Pulmonary;;   Family  History  Problem Relation Age of Onset   Heart disease Father    Hypertension Father    Stroke Father    Diabetes Paternal Aunt    Diabetes Maternal Grandmother    Diabetes Other        GM, nephews, many family members   Hyperlipidemia Other        ?   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   Tobacco Use   Smoking status: Former    Current packs/day: 0.00    Average packs/day: 2.0 packs/day for 30.0 years (60.0 ttl pk-yrs)    Types: Cigarettes    Start date: 06/18/1947    Quit date: 06/17/1977    Years since quitting: 46.5   Smokeless tobacco: Never   Tobacco comments:    2 ppd, quit 1978  Vaping Use   Vaping status: Never Used  Substance and Sexual Activity   Alcohol use: Not Currently   Drug use: No   Sexual activity: Yes  Other Topics Concern   Not on file  Social History Narrative   4 children (lost 1 son)   Wife has 2 children                Social Drivers of Corporate investment banker Strain: Low Risk   (12/17/2023)   Overall Financial Resource Strain (CARDIA)    Difficulty of Paying Living Expenses: Not hard at all  Food Insecurity: No Food Insecurity (12/17/2023)   Hunger Vital Sign    Worried About Running Out of Food in the Last Year: Never true    Ran Out of Food in the Last Year: Never true  Transportation Needs: No Transportation Needs (12/17/2023)   PRAPARE - Administrator, Civil Service (Medical): No    Lack of Transportation (Non-Medical): No  Physical Activity: Inactive (12/17/2023)   Exercise Vital Sign    Days of Exercise per Week: 0 days    Minutes of Exercise per Session: 0 min  Stress: No Stress Concern Present (12/17/2023)   [employer] of Occupational Health - Occupational Stress Questionnaire    Feeling of Stress : Not at all  Social Connections: Socially Integrated (12/17/2023)   Social Connection and Isolation Panel [NHANES]    Frequency of Communication with Friends and Family: More than three times a week    Frequency of Social Gatherings with Friends and Family: More than three times a week    Attends Religious Services: More than 4 times per year    Active Member of [employer] or Organizations: Yes    Attends Engineer, structural: More than 4 times per year    Marital Status: Married    Tobacco Counseling Counseling given: Not Answered Tobacco comments: 2 ppd, quit 1978    Clinical Intake:  Pre-visit preparation completed: Yes  Pain : No/denies pain Pain Score: 0-No pain     BMI - recorded: 28.25 Nutritional Status: BMI 25 -29 Overweight Nutritional Risks: None Diabetes: Yes CBG done?: Yes (CBG 148 per patient) CBG resulted in Enter/ Edit results?: Yes Did pt. bring in CBG monitor from home?: No  Lab Results  Component Value Date   HGBA1C 7.9 (H) 11/28/2023   HGBA1C 6.9 (H) 09/09/2023   HGBA1C 6.4 (A) 08/01/2023     How often do you need to have someone help you when you read instructions, pamphlets, or other written  materials from your doctor or pharmacy?: 1 - Never  Interpreter Needed?: No  Information entered by :: Reeves Dam LPN   Activities of Daily Living     12/17/2023    9:39 AM 09/17/2023   12:48 PM  In your present state of health, do you have any difficulty performing the following activities:  Hearing? 0   Vision? 0   Difficulty concentrating or making decisions? 0   Walking or climbing stairs? 0   Dressing or bathing? 0   Doing errands, shopping? 0 0  Preparing Food and eating ? N   Using the Toilet? N   In the past six months, have you accidently leaked urine? N   Do you have problems with loss of bowel control? N   Managing your Medications? N   Managing your Finances? N   Housekeeping or managing your Housekeeping? N     Patient Care Team: Wanda Plump, MD as PCP - General Allyson Sabal Delton See, MD as PCP - Cardiology (Cardiology) Waymon Budge, MD as Consulting Physician (Pulmonary Disease) Ernesto Rutherford, MD as Consulting Physician (Ophthalmology) Charlett Nose, DPM (Inactive) as Consulting Physician (Podiatry) Lars Masson, MD as Consulting Physician (Cardiology) Jethro Bolus, MD (Inactive) as Consulting Physician (Urology) Charna Elizabeth, MD as Consulting Physician (Gastroenterology) [facility], Dory Peru, MD as Rounding Team (Cardiology) Duke, Roe Rutherford, [place] as Physician Assistant (Cardiology) Colletta Maryland, RN as [facility] Care Management  Indicate any recent Medical Services you may have received from other than [facility] providers in the past year (date may be approximate).     Assessment:   This is a routine wellness examination for Debbie.  Hearing/Vision screen Hearing Screening - Comments:: Denies hearing difficulties   Vision Screening - Comments:: Wears rx glasses - up to date with routine eye exams with  [facility]   Goals Addressed               This Visit's Progress     Maintain better health  (pt-stated)         Depression Screen     12/17/2023    9:38 AM 08/02/2023    9:54 AM 04/01/2023    8:58 AM 03/06/2023   10:22 AM 11/29/2022    8:23 AM 07/31/2022    8:51 AM 06/04/2022    8:36 AM  PHQ 2/9 Scores  PHQ - 2 Score 0 1 0 0 0 0 0  PHQ- 9 Score  4 1 1 1 2 2     Fall Risk     12/17/2023    9:39 AM 08/02/2023    9:53 AM 04/01/2023    8:58 AM 03/06/2023   10:22 AM 11/29/2022    7:53 AM  Fall Risk   Falls in the past year? 0 0 0 0 0  Number falls in past yr: 0 0 0 0 0  Injury with Fall? 0 0 0 0 0  Risk for fall due to : No Fall Risks      Follow up Falls prevention discussed;Falls evaluation completed Falls evaluation completed Falls evaluation completed Falls evaluation completed Falls evaluation completed    MEDICARE RISK AT HOME:  Medicare Risk at Home Any stairs in or around the home?:  Yes If so, are there any without handrails?: No Home free of loose throw rugs in walkways, pet beds, electrical cords, etc?: Yes Adequate lighting in your home to reduce risk of falls?: Yes Life alert?: Yes Use of a cane, walker or w/c?: No Grab bars in the bathroom?: Yes Shower chair or bench in shower?: Yes Elevated toilet seat or a handicapped toilet?: Yes  TIMED UP AND GO:  Was the test performed?  No  Cognitive Function: 6CIT completed        12/17/2023    9:40 AM  6CIT Screen  What Year? 0 points  What month? 0 points  What time? 0 points  Count back from 20 0 points  Months in reverse 0 points  Repeat phrase 0 points  Total Score 0 points    Immunizations Immunization History  Administered Date(s) Administered   Fluad Quad(high Dose 65+) 05/07/2019, 06/06/2020, 06/29/2021   Influenza Split 06/18/2012, 06/17/2020   Influenza Whole 05/02/2010, 06/18/2011   Influenza, High Dose Seasonal PF 05/18/2013, 06/17/2014, 07/19/2015, 06/19/2016, 07/18/2016, 06/05/2017, 07/16/2017, 06/06/2018, 07/22/2018, 05/24/2023   Influenza,inj,Quad PF,6+ Mos 06/04/2013, 06/10/2014,  07/06/2015   Influenza-Unspecified 07/31/2000, 06/17/2012, 05/18/2022   Moderna Covid-19 Fall Seasonal Vaccine 71yrs & older 06/22/2022, 05/24/2023   Moderna Sars-Covid-2 Vaccination 10/13/2019, 11/10/2019, 07/18/2020, 12/16/2020   PNEUMOCOCCAL CONJUGATE-20 05/24/2023   Pfizer Covid-19 Vaccine Bivalent Booster 5y-11y 06/19/2021   Pneumococcal Conjugate-13 03/17/2013, 01/18/2014   Pneumococcal Polysaccharide-23 06/21/2010, 10/17/2018   Pneumococcal-Unspecified 05/19/2011   Td 09/17/2004, 12/16/2013   Tdap 05/18/2005, 11/18/2014   Zoster Recombinant(Shingrix) 10/21/2018, 04/06/2019    Screening Tests Health Maintenance  Topic Date Due   OPHTHALMOLOGY EXAM  08/17/2023   COVID-19 Vaccine (8 - Moderna risk 2024-25 season) 11/21/2023   Diabetic kidney evaluation - Urine ACR  03/31/2024   INFLUENZA VACCINE  04/17/2024   FOOT EXAM  05/02/2024   HEMOGLOBIN A1C  05/30/2024   DTaP/Tdap/Td (5 - Td or Tdap) 11/17/2024   Diabetic kidney evaluation - eGFR measurement  12/15/2024   Medicare Annual Wellness (AWV)  12/16/2024   Pneumonia Vaccine 50+ Years old  Completed   Zoster Vaccines- Shingrix  Completed   HPV VACCINES  Aged Out   Colonoscopy  Discontinued    Health Maintenance  Health Maintenance Due  Topic Date Due   OPHTHALMOLOGY EXAM  08/17/2023   COVID-19 Vaccine (8 - Moderna risk 2024-25 season) 11/21/2023   Health Maintenance Items Addressed:    Additional Screening:  Vision Screening: Recommended annual ophthalmology exams for early detection of glaucoma and other disorders of the eye.  Dental Screening: Recommended annual dental exams for proper oral hygiene  Community Resource Referral / Chronic Care Management: CRR required this visit?  No   CCM required this visit?  No     Plan:     I have personally reviewed and noted the following in the patient's chart:   Medical and social history Use of alcohol, tobacco or illicit drugs  Current medications and  supplements including opioid prescriptions. Patient is not currently taking opioid prescriptions. Functional ability and status Nutritional status Physical activity Advanced directives List of other physicians Hospitalizations, surgeries, and ER visits in previous 12 months Vitals Screenings to include cognitive, depression, and falls Referrals and appointments  In addition, I have reviewed and discussed with patient certain preventive protocols, quality metrics, and best practice recommendations. A written personalized care plan for preventive services as well as general preventive health recommendations were provided to patient.     Tillie Rung, LPN  12/17/2023   After Visit Summary: (MyChart) Due to this being a telephonic visit, the after visit summary with patients personalized plan was offered to patient via MyChart   Notes: Nothing significant to report at this time.

## 2023-12-17 NOTE — Patient Instructions (Addendum)
 Mr. Jonathan [name] , Thank you for taking time to come for your Medicare Wellness Visit. I appreciate your ongoing commitment to your health goals. Please review the following plan we discussed and let me know if I can assist you in the future.   Referrals/Orders/Follow-Ups/Clinician Recommendations:   This is a list of the screening recommended for you and due dates:  Health Maintenance  Topic Date Due   Eye exam for diabetics  08/17/2023   COVID-19 Vaccine (8 - Moderna risk 2024-25 season) 11/21/2023   Yearly kidney health urinalysis for diabetes  03/31/2024   Flu Shot  04/17/2024   Complete foot exam   05/02/2024   Hemoglobin A1C  05/30/2024   DTaP/Tdap/Td vaccine (5 - Td or Tdap) 11/17/2024   Yearly kidney function blood test for diabetes  12/15/2024   Medicare Annual Wellness Visit  12/16/2024   Pneumonia Vaccine  Completed   Zoster (Shingles) Vaccine  Completed   HPV Vaccine  Aged Out   Colon Cancer Screening  Discontinued    Advanced directives: (Copy Requested) Please bring a copy of your health care power of attorney and living will to the office to be added to your chart at your convenience. You can mail to [facility] [address] [place], [place] [address] or email to [email or web address]  Next Medicare Annual Wellness Visit scheduled for next year: Yes

## 2023-12-18 ENCOUNTER — Other Ambulatory Visit: Payer: Self-pay

## 2023-12-18 ENCOUNTER — Ambulatory Visit
Admission: RE | Admit: 2023-12-18 | Discharge: 2023-12-18 | Disposition: A | Discharge status: Home / Self Care | Payer: Medicare Other | Source: Ambulatory Visit | Attending: Radiation Oncology

## 2023-12-18 DIAGNOSIS — Z510 Encounter for antineoplastic radiation therapy: Secondary | ICD-10-CM | POA: Diagnosis not present

## 2023-12-18 DIAGNOSIS — Z87891 Personal history of nicotine dependence: Secondary | ICD-10-CM | POA: Diagnosis not present

## 2023-12-18 DIAGNOSIS — C3412 Malignant neoplasm of upper lobe, left bronchus or lung: Secondary | ICD-10-CM | POA: Diagnosis not present

## 2023-12-18 LAB — RAD ONC ARIA SESSION SUMMARY
Course Elapsed Days: 36
Plan Fractions Treated to Date: 27
Plan Prescribed Dose Per Fraction: 2 Gy
Plan Total Fractions Prescribed: 30
Plan Total Prescribed Dose: 60 Gy
Reference Point Dosage Given to Date: 54 Gy
Reference Point Session Dosage Given: 2 Gy
Session Number: 27

## 2023-12-19 ENCOUNTER — Other Ambulatory Visit: Payer: Self-pay

## 2023-12-19 ENCOUNTER — Ambulatory Visit: Payer: Medicare Other

## 2023-12-19 ENCOUNTER — Ambulatory Visit: Payer: Medicare Other | Admitting: Internal Medicine

## 2023-12-19 ENCOUNTER — Other Ambulatory Visit: Payer: Medicare Other

## 2023-12-19 ENCOUNTER — Ambulatory Visit
Admission: RE | Admit: 2023-12-19 | Discharge: 2023-12-19 | Disposition: A | Discharge status: Home / Self Care | Payer: Medicare Other | Source: Ambulatory Visit | Attending: Radiation Oncology

## 2023-12-19 DIAGNOSIS — C3412 Malignant neoplasm of upper lobe, left bronchus or lung: Secondary | ICD-10-CM | POA: Diagnosis not present

## 2023-12-19 DIAGNOSIS — Z87891 Personal history of nicotine dependence: Secondary | ICD-10-CM | POA: Diagnosis not present

## 2023-12-19 DIAGNOSIS — Z510 Encounter for antineoplastic radiation therapy: Secondary | ICD-10-CM | POA: Diagnosis not present

## 2023-12-19 LAB — RAD ONC ARIA SESSION SUMMARY
Course Elapsed Days: 37
Plan Fractions Treated to Date: 28
Plan Prescribed Dose Per Fraction: 2 Gy
Plan Total Fractions Prescribed: 30
Plan Total Prescribed Dose: 60 Gy
Reference Point Dosage Given to Date: 56 Gy
Reference Point Session Dosage Given: 2 Gy
Session Number: 28

## 2023-12-19 NOTE — Patient Outreach (Signed)
 Care Coordination      Visit Note   12/19/2023 Name: Numan Zylstra. MRN: [id number] DOB: 08/30/1936  [name]. is a 88 y.o. year old male who sees Wanda Plump, MD for primary care. No patient communication was made during this encounter.  Documentation encounter only: late entry for 10/24/23. Per TOC note dated 10/24/23: patient denies need for ongoing case management follow up. Case closed.   Goals Addressed             This Visit's Progress    COMPLETED: Assist with Health Management       Interventions Today    Flowsheet Row Most Recent Value  General Interventions   General Interventions Discussed/Reviewed General Interventions Reviewed  [goals closed, case closed]            SDOH assessments and interventions completed:  No  Care Coordination Interventions:  No, not indicated   Follow up plan: No further intervention required.   Encounter Outcome:  Patient Visit Completed   Kathyrn Sheriff, RN, MSN, BSN, CCM [place]  [facility], Population Health Case Manager Phone: [phone]

## 2023-12-20 ENCOUNTER — Ambulatory Visit
Admission: RE | Admit: 2023-12-20 | Discharge: 2023-12-20 | Disposition: A | Discharge status: Home / Self Care | Source: Ambulatory Visit | Attending: Radiation Oncology | Admitting: Radiation Oncology

## 2023-12-20 ENCOUNTER — Other Ambulatory Visit: Payer: Self-pay

## 2023-12-20 ENCOUNTER — Ambulatory Visit
Admission: RE | Admit: 2023-12-20 | Discharge: 2023-12-20 | Disposition: A | Discharge status: Home / Self Care | Payer: Medicare Other | Source: Ambulatory Visit | Attending: Radiation Oncology | Admitting: Radiation Oncology

## 2023-12-20 DIAGNOSIS — C3412 Malignant neoplasm of upper lobe, left bronchus or lung: Secondary | ICD-10-CM | POA: Diagnosis not present

## 2023-12-20 DIAGNOSIS — Z87891 Personal history of nicotine dependence: Secondary | ICD-10-CM | POA: Diagnosis not present

## 2023-12-20 DIAGNOSIS — Z510 Encounter for antineoplastic radiation therapy: Secondary | ICD-10-CM | POA: Diagnosis not present

## 2023-12-20 LAB — RAD ONC ARIA SESSION SUMMARY
Course Elapsed Days: 38
Plan Fractions Treated to Date: 29
Plan Prescribed Dose Per Fraction: 2 Gy
Plan Total Fractions Prescribed: 30
Plan Total Prescribed Dose: 60 Gy
Reference Point Dosage Given to Date: 58 Gy
Reference Point Session Dosage Given: 2 Gy
Session Number: 29

## 2023-12-23 ENCOUNTER — Other Ambulatory Visit: Payer: Medicare Other

## 2023-12-23 ENCOUNTER — Ambulatory Visit
Admission: RE | Admit: 2023-12-23 | Discharge: 2023-12-23 | Disposition: A | Discharge status: Home / Self Care | Payer: Medicare Other | Source: Ambulatory Visit | Attending: Radiation Oncology

## 2023-12-23 ENCOUNTER — Ambulatory Visit: Payer: Medicare Other | Admitting: Physician Assistant

## 2023-12-23 ENCOUNTER — Ambulatory Visit: Payer: Medicare Other

## 2023-12-23 ENCOUNTER — Other Ambulatory Visit: Payer: Self-pay

## 2023-12-23 ENCOUNTER — Inpatient Hospital Stay

## 2023-12-23 ENCOUNTER — Inpatient Hospital Stay (HOSPITAL_BASED_OUTPATIENT_CLINIC_OR_DEPARTMENT_OTHER): Admitting: Physician Assistant

## 2023-12-23 VITALS — BP 146/82 | HR 90 | Temp 97.3°F | Resp 18 | Wt 213.1 lb

## 2023-12-23 DIAGNOSIS — Z510 Encounter for antineoplastic radiation therapy: Secondary | ICD-10-CM | POA: Diagnosis not present

## 2023-12-23 DIAGNOSIS — D709 Neutropenia, unspecified: Secondary | ICD-10-CM | POA: Diagnosis not present

## 2023-12-23 DIAGNOSIS — Z87891 Personal history of nicotine dependence: Secondary | ICD-10-CM | POA: Diagnosis not present

## 2023-12-23 DIAGNOSIS — C3412 Malignant neoplasm of upper lobe, left bronchus or lung: Secondary | ICD-10-CM

## 2023-12-23 LAB — CMP (CANCER CENTER ONLY)
ALT: 10 U/L (ref 0–44)
AST: 14 U/L — ABNORMAL LOW (ref 15–41)
Albumin: 3.2 g/dL — ABNORMAL LOW (ref 3.5–5.0)
Alkaline Phosphatase: 64 U/L (ref 38–126)
Anion gap: 3 — ABNORMAL LOW (ref 5–15)
BUN: 12 mg/dL (ref 8–23)
CO2: 28 mmol/L (ref 22–32)
Calcium: 8.8 mg/dL — ABNORMAL LOW (ref 8.9–10.3)
Chloride: 108 mmol/L (ref 98–111)
Creatinine: 0.94 mg/dL (ref 0.61–1.24)
GFR, Estimated: >60 mL/min (ref >60)
Glucose, Bld: 120 mg/dL — ABNORMAL HIGH (ref 70–99)
Potassium: 3.3 mmol/L — ABNORMAL LOW (ref 3.5–5.1)
Sodium: 139 mmol/L (ref 135–145)
Total Bilirubin: 0.7 mg/dL (ref 0.0–1.2)
Total Protein: 6.7 g/dL (ref 6.5–8.1)

## 2023-12-23 LAB — RAD ONC ARIA SESSION SUMMARY
Course Elapsed Days: 41
Plan Fractions Treated to Date: 30
Plan Prescribed Dose Per Fraction: 2 Gy
Plan Total Fractions Prescribed: 30
Plan Total Prescribed Dose: 60 Gy
Reference Point Dosage Given to Date: 60 Gy
Reference Point Session Dosage Given: 2 Gy
Session Number: 30

## 2023-12-23 LAB — CBC WITH DIFFERENTIAL (CANCER CENTER ONLY)
Abs Immature Granulocytes: 0.01 10*3/uL (ref 0.00–0.07)
Basophils Absolute: 0 10*3/uL (ref 0.0–0.1)
Basophils Relative: 1 %
Eosinophils Absolute: 0 10*3/uL (ref 0.0–0.5)
Eosinophils Relative: 1 %
HCT: 34.8 % — ABNORMAL LOW (ref 39.0–52.0)
Hemoglobin: 11.9 g/dL — ABNORMAL LOW (ref 13.0–17.0)
Immature Granulocytes: 1 %
Lymphocytes Relative: 28 %
Lymphs Abs: 0.5 10*3/uL — ABNORMAL LOW (ref 0.7–4.0)
MCH: 30.7 pg (ref 26.0–34.0)
MCHC: 34.2 g/dL (ref 30.0–36.0)
MCV: 89.7 fL (ref 80.0–100.0)
Monocytes Absolute: 0.3 10*3/uL (ref 0.1–1.0)
Monocytes Relative: 16 %
Neutro Abs: 0.9 10*3/uL — ABNORMAL LOW (ref 1.7–7.7)
Neutrophils Relative %: 53 %
Platelet Count: 139 10*3/uL — ABNORMAL LOW (ref 150–400)
RBC: 3.88 MIL/uL — ABNORMAL LOW (ref 4.22–5.81)
RDW: 16.1 % — ABNORMAL HIGH (ref 11.5–15.5)
WBC Count: 1.7 10*3/uL — ABNORMAL LOW (ref 4.0–10.5)
nRBC: 0 % (ref 0.0–0.2)

## 2023-12-24 ENCOUNTER — Ambulatory Visit: Admitting: Internal Medicine

## 2023-12-24 ENCOUNTER — Inpatient Hospital Stay

## 2023-12-24 ENCOUNTER — Other Ambulatory Visit

## 2023-12-24 ENCOUNTER — Ambulatory Visit
Admission: RE | Admit: 2023-12-24 | Discharge: 2023-12-24 | Disposition: A | Discharge status: Home / Self Care | Payer: Medicare Other | Source: Ambulatory Visit | Attending: Radiation Oncology

## 2023-12-24 ENCOUNTER — Other Ambulatory Visit: Payer: Self-pay

## 2023-12-24 ENCOUNTER — Ambulatory Visit: Admitting: Physician Assistant

## 2023-12-24 ENCOUNTER — Ambulatory Visit: Payer: Medicare Other

## 2023-12-24 DIAGNOSIS — C3412 Malignant neoplasm of upper lobe, left bronchus or lung: Secondary | ICD-10-CM | POA: Diagnosis not present

## 2023-12-24 DIAGNOSIS — Z87891 Personal history of nicotine dependence: Secondary | ICD-10-CM | POA: Diagnosis not present

## 2023-12-24 DIAGNOSIS — Z510 Encounter for antineoplastic radiation therapy: Secondary | ICD-10-CM | POA: Diagnosis not present

## 2023-12-24 LAB — RAD ONC ARIA SESSION SUMMARY
Course Elapsed Days: 42
Plan Fractions Treated to Date: 1
Plan Prescribed Dose Per Fraction: 2 Gy
Plan Total Fractions Prescribed: 3
Plan Total Prescribed Dose: 6 Gy
Reference Point Dosage Given to Date: 2 Gy
Reference Point Session Dosage Given: 2 Gy
Session Number: 31

## 2023-12-25 ENCOUNTER — Other Ambulatory Visit: Payer: Self-pay

## 2023-12-25 ENCOUNTER — Ambulatory Visit: Payer: Medicare Other

## 2023-12-25 ENCOUNTER — Ambulatory Visit

## 2023-12-25 ENCOUNTER — Ambulatory Visit
Admission: RE | Admit: 2023-12-25 | Discharge: 2023-12-25 | Disposition: A | Discharge status: Home / Self Care | Source: Ambulatory Visit | Attending: Radiation Oncology | Admitting: Radiation Oncology

## 2023-12-25 ENCOUNTER — Other Ambulatory Visit

## 2023-12-25 DIAGNOSIS — Z510 Encounter for antineoplastic radiation therapy: Secondary | ICD-10-CM | POA: Diagnosis not present

## 2023-12-25 DIAGNOSIS — C3412 Malignant neoplasm of upper lobe, left bronchus or lung: Secondary | ICD-10-CM | POA: Diagnosis not present

## 2023-12-25 LAB — RAD ONC ARIA SESSION SUMMARY
Course Elapsed Days: 43
Plan Fractions Treated to Date: 2
Plan Prescribed Dose Per Fraction: 2 Gy
Plan Total Fractions Prescribed: 3
Plan Total Prescribed Dose: 6 Gy
Reference Point Dosage Given to Date: 4 Gy
Reference Point Session Dosage Given: 2 Gy
Session Number: 32

## 2023-12-26 ENCOUNTER — Ambulatory Visit

## 2023-12-26 ENCOUNTER — Other Ambulatory Visit: Payer: Self-pay

## 2023-12-26 ENCOUNTER — Ambulatory Visit
Admission: RE | Admit: 2023-12-26 | Discharge: 2023-12-26 | Discharge status: Home / Self Care | Payer: Medicare Other | Source: Ambulatory Visit | Attending: Radiation Oncology

## 2023-12-26 DIAGNOSIS — Z510 Encounter for antineoplastic radiation therapy: Secondary | ICD-10-CM | POA: Diagnosis not present

## 2023-12-26 DIAGNOSIS — Z87891 Personal history of nicotine dependence: Secondary | ICD-10-CM | POA: Diagnosis not present

## 2023-12-26 DIAGNOSIS — C3412 Malignant neoplasm of upper lobe, left bronchus or lung: Secondary | ICD-10-CM | POA: Diagnosis not present

## 2023-12-26 LAB — RAD ONC ARIA SESSION SUMMARY
Course Elapsed Days: 44
Plan Fractions Treated to Date: 3
Plan Prescribed Dose Per Fraction: 2 Gy
Plan Total Fractions Prescribed: 3
Plan Total Prescribed Dose: 6 Gy
Reference Point Dosage Given to Date: 6 Gy
Reference Point Session Dosage Given: 2 Gy
Session Number: 33

## 2023-12-27 ENCOUNTER — Ambulatory Visit: Payer: Medicare Other | Admitting: Internal Medicine

## 2023-12-27 NOTE — Radiation Completion Notes (Addendum)
  Radiation Oncology         [phone] ________________________________  Name: Jonathan [name]. MRN: [id number]  Date of Service: 12/26/2023  DOB: 1935-09-20  End of Treatment Note   Diagnosis:    Stage IIIA, cT3N2M0, NSCLC, squamous cell carcinoma of the LUL.  Intent: Curative     ==========DELIVERED PLANS==========  First Treatment Date: 2023-11-12 Last Treatment Date: 2023-12-26   Plan Name: Lung_L Site: Lung, Left Technique: 3D Mode: Photon Dose Per Fraction: 2 Gy Prescribed Dose (Delivered / Prescribed): 60 Gy / 60 Gy Prescribed Fxs (Delivered / Prescribed): 30 / 30   Plan Name: Lung_L_Bst Site: Lung, Left Technique: 3D Mode: Photon Dose Per Fraction: 2 Gy Prescribed Dose (Delivered / Prescribed): 6 Gy / 6 Gy Prescribed Fxs (Delivered / Prescribed): 3 / 3     ==========ON TREATMENT VISIT DATES========== 2023-11-15, 2023-11-22, 2023-11-29, 2023-12-06, 2023-12-13, 2023-12-20, 2023-12-26    See weekly On Treatment Notes in Epic for details in the Media tab (listed as Progress notes on the On Treatment Visit Dates listed above). The patient tolerated radiation. He developed fatigue and anticipated esophagitis as a result of radiotherapy.  The patient will receive a call in about one month from the radiation oncology department. He will continue follow up with Dr. Marguerita Shih as well.      Shelvia Dick, PAC

## 2023-12-31 ENCOUNTER — Encounter: Payer: Self-pay | Admitting: Internal Medicine

## 2023-12-31 ENCOUNTER — Ambulatory Visit: Admitting: Internal Medicine

## 2023-12-31 VITALS — BP 138/86 | HR 90 | Temp 97.7°F | Resp 20 | Ht 74.0 in | Wt 213.0 lb

## 2023-12-31 DIAGNOSIS — I2510 Atherosclerotic heart disease of native coronary artery without angina pectoris: Secondary | ICD-10-CM | POA: Diagnosis not present

## 2023-12-31 DIAGNOSIS — E1159 Type 2 diabetes mellitus with other circulatory complications: Secondary | ICD-10-CM | POA: Diagnosis not present

## 2023-12-31 NOTE — Progress Notes (Unsigned)
 Subjective:    Patient ID: Jonathan Beath., male    DOB: [birth year], 88 y.o.   MRN: [id number]  DOS:  12/31/2023 Type of visit - description: Follow-up  Since the last visit, he was diagnosed with lung cancer. Notes from hematology and radiation therapy reviewed. Fortunately he feels well. Has a chronic cough, chronic chest congestion but denies fever chills or hemoptysis. He does feel tired on and off.   Review of Systems See above   Past Medical History:  Diagnosis Date   CAD (coronary artery disease)    Two RCA stents remotely / 3rd RCA stent 2006   Colon polyps    s/p several Cscopes.   COPD (chronic obstructive pulmonary disease) (HCC)    on O2, nocturnal   Diabetes mellitus    dx aprox 2009   ED (erectile dysfunction)    has a vacumm device   Ejection fraction    Hyperlipidemia    dx in 90s   Hypertension    dx in the 70   Hypogonadism male    PVD (peripheral vascular disease) (HCC)    s/p stents at LE 2009, Dr Jacinto Halim   Shortness of breath    O2 Sat dropped to 82% walking on the treadmill, September, 2012    Past Surgical History:  Procedure Laterality Date   APPENDECTOMY     BRONCHIAL BIOPSY  10/01/2023   Procedure: BRONCHIAL BIOPSIES;  Surgeon: Josephine Igo, DO;  Location: [facility];  Service: Pulmonary;;   BRONCHIAL BRUSHINGS  10/01/2023   Procedure: BRONCHIAL BRUSHINGS;  Surgeon: Josephine Igo, DO;  Location: [facility];  Service: Pulmonary;;   BRONCHIAL WASHINGS  10/01/2023   Procedure: BRONCHIAL WASHINGS;  Surgeon: Josephine Igo, DO;  [facility]: [facility];  Service: Pulmonary;;   CORONARY STENT INTERVENTION N/A 01/12/2022   Procedure: CORONARY STENT INTERVENTION;  Surgeon: Swaziland, Peter M, MD;  [facility];  Service: Cardiovascular;  Laterality: N/A;   HEMOSTASIS CONTROL  10/01/2023   Procedure: HEMOSTASIS CONTROL;  Surgeon: Josephine Igo, DO;  [facility]: [facility];  Service: Pulmonary;;   LEFT HEART CATH AND  CORONARY ANGIOGRAPHY N/A 03/13/2023   Procedure: LEFT HEART CATH AND CORONARY ANGIOGRAPHY;  Surgeon: Swaziland, Peter M, MD;  [facility]: [facility];  Service: Cardiovascular;  Laterality: N/A;   RIGHT/LEFT HEART CATH AND CORONARY ANGIOGRAPHY N/A 01/11/2022   Procedure: RIGHT/LEFT HEART CATH AND CORONARY ANGIOGRAPHY;  Surgeon: Lyn Records, MD;  [facility];  Service: Cardiovascular;  Laterality: N/A;   TONSILLECTOMY     VIDEO BRONCHOSCOPY  10/01/2023   Procedure: VIDEO BRONCHOSCOPY WITHOUT FLUORO;  Surgeon: Josephine Igo, DO;  [facility];  Service: Pulmonary;;    Current Outpatient Medications  Medication Instructions   albuterol (VENTOLIN HFA) 108 (90 Base) MCG/ACT inhaler USE 1 TO 2 INHALATIONS EVERY 6 HOURS AS NEEDED FOR WHEEZING OR SHORTNESS OF BREATH   Blood Glucose Monitoring Suppl (FREESTYLE FREEDOM LITE) w/Device KIT Use Freestyle Freedom Lite meter to check blood sugar twice daily. DX:E11.65   clopidogrel (PLAVIX) 75 mg, Oral, Daily with breakfast   Dulaglutide (TRULICITY) 3 MG/0.5ML SOAJ INJECT 3 MG UNDER THE SKIN ONCE A WEEK   empagliflozin (JARDIANCE) 25 mg, Oral, Daily before breakfast, For diabetes E11.65 and E 11.29   furosemide (LASIX) 40 mg, Oral, Daily   gabapentin (NEURONTIN) 100 mg, 2 times daily   glucose blood (FREESTYLE LITE) test strip USE AS INSTRUCTED   hydrOXYzine (ATARAX) 25  MG tablet 1 tablet, At bedtime PRN   Insulin Pen Needle 32G X 4 MM MISC Use 1 x a day   Lantus SoloStar 15 Units, Subcutaneous, Daily   metFORMIN (GLUCOPHAGE) 500 mg, Oral, 2 times daily with meals   metoprolol (TOPROL-XL) 100 mg, Daily   Multiple Vitamin (MULTIVITAMIN WITH MINERALS) TABS tablet 1 tablet, Daily   ondansetron (ZOFRAN) 8 mg, Oral, Every 8 hours PRN, Start on the third day after chemotherapy.   OXYGEN 2 L/min, Daily at bedtime   prochlorperazine (COMPAZINE) 10 mg, Oral, Every 6 hours PRN   sacubitril-valsartan (ENTRESTO) 24-26 MG 1 tablet,  Oral, 2 times daily   simvastatin (ZOCOR) 20 mg, Daily   sucralfate (CARAFATE) 1 g, Oral, 3 times daily with meals & bedtime, 5 min before meals for radiation induced esophagitis   tamsulosin (FLOMAX) 0.4 mg, Oral, Daily after supper   Tiotropium Bromide-Olodaterol (STIOLTO RESPIMAT) 2.5-2.5 MCG/ACT AERS 2 puffs, Inhalation, Daily       Objective:   Physical Exam BP 138/86   Pulse 90   Temp 97.7 F (36.5 C) (Oral)   Resp 20   Ht 6\' 2"  (1.88 m)   Wt 213 lb (96.6 kg)   SpO2 94%   BMI 27.35 kg/m  General:   Well developed, no distress, appears well. HEENT:  Normocephalic . Face symmetric, atraumatic Lungs:  Large airway congestion with cough.  No wheezing. Normal respiratory effort, no intercostal retractions, no accessory muscle use. Heart: RRR,  no murmur.  Lower extremities: no pretibial edema bilaterally  Skin: Not pale. Not jaundice Neurologic:  alert & oriented X3.  Speech normal, gait appropriate for age and unassisted Psych--  Cognition and judgment appear intact.  Cooperative with normal attention span and concentration.  Behavior appropriate. No anxious or depressed appearing.      Assessment     Assessment DM dx ~2009, complicated by CAD, PVD, mild DM neuropathy.  Per Endo hypogonadism.  Per Endo HTN Hyperlipidemia Pulmonary: --COPD, nocturnal O2 prn --Non-small cell -SCC lung cancer (LUL) Dx 09-2023: Chemotherapy, radiation therapy. Back pain, chronic: See visit 10/12/2016 CV: -CAD  S/p  stenting 2 to the RCA remote past and the third RCA stent in  2006, negative stress test in 2012, LVEF 55% on echo 2015 -PVD- stents lower extremity 2009  -02-2023: Chronic systolic and diastolic CHF newly recognized . Cath: Nonobstructive CAD Polycythemia: Saw hematology 11-2018, felt to be due to chronic respiratory failure.  Not likely polycythemia vera  PLAN    Lung cancer: Since LOV he was diagnosed with lung cancer, finished chemoradiation treatment, next  evaluation will be next month. Currently with cough and chest congestion. Has not been taking Stiolto, we agreed he will do. Interestingly, O2 sats on room air are steady at around 94%.  He still have some oxygen supplement if needed DM: Last A1c 7.9, per endo- HTN: BP today looks good CAD, CHF: Good med compliance, not volume overloaded. High cholesterol: Last LDL 48, no change, continue statins Preventive care: Recommend RSV RTC 4 months    Hospital follow-up. Respiratory failure: Improved, has chronic hypoxia and oxygen supplements seems to be back to baseline. Community-acquired pneumonia: Completed antibiotics in the hospital.  Doing very well, no fever, cough is minimal.  Check a CMP and CBC Hemoptysis, CT showing masslike consolidation, plan is for outpatient bronchoscopy.  ER if  starts coughing blood again. Emphysema: In the hospital was managed with multiple meds, currently back on his Stiolto, albuterol.  Seems well-controlled CAD:  Plavix was held during the hospitalist the and will continue holding due to upcoming bronchoscopy. CHF: There was no evidence of exacerbation. DM: Per Endo, finishing a round of steroids, CBGs never more than 180 per patient. RTC 3 months

## 2023-12-31 NOTE — Patient Instructions (Addendum)
 Please check with your pharmacy if you had a  RSV vaccine.  If you have not, consider getting it.       Next office visit for a checkup in 4 months Please make an appointment before you leave today Depending on your blood or XRs results it might be necessary to come back sooner       Per our records you are due for your diabetic eye exam. Please contact your eye doctor to schedule an appointment. Please have them send copies of your office visit notes to us . Our fax number is [phone]. If you need a referral to an eye doctor please let us  know.

## 2024-01-01 LAB — PROTEIN / CREATININE RATIO, URINE
Albumin, U: 14.9
Creatinine, Urine: 179

## 2024-01-01 LAB — LIPID PANEL
Cholesterol: 109 (ref 0–200)
HDL: 41 (ref 35–70)
LDL Cholesterol: 60
Triglycerides: 69 (ref 40–160)

## 2024-01-01 LAB — HEPATIC FUNCTION PANEL
ALT: 9 U/L — AB (ref 10–40)
AST: 14 (ref 14–40)
Alkaline Phosphatase: 79 (ref 25–125)
Bilirubin, Direct: 0.2
Bilirubin, Total: 0.4

## 2024-01-01 LAB — HEMOGLOBIN A1C: Hemoglobin A1C: 7.4

## 2024-01-01 LAB — BASIC METABOLIC PANEL WITH GFR
BUN: 14 (ref 4–21)
CO2: 23 — AB (ref 13–22)
Chloride: 112 — AB (ref 99–108)
Creatinine: 1.1 (ref 0.6–1.3)
Glucose: 87
Potassium: 3.5 meq/L (ref 3.5–5.1)
Sodium: 144 (ref 137–147)

## 2024-01-01 LAB — COMPREHENSIVE METABOLIC PANEL WITH GFR
Albumin: 3.6 (ref 3.5–5.0)
Calcium: 9 (ref 8.7–10.7)
eGFR: 67

## 2024-01-01 NOTE — Assessment & Plan Note (Signed)
 Lung cancer: Since LOV he was diagnosed with lung cancer, finished chemoradiation treatment, next evaluation will be next month. Currently with cough and chest congestion. Has not been taking Stiolto, we agreed he will do. Interestingly, O2 sats on room air are steady at around 94%.  He still have  oxygen supplements but using them prn. DM: Last A1c 7.9, per endo- HTN: BP today looks good.Continue Lasix, metoprolol, Entresto. CAD, CHF: Good med compliance, not volume overloaded. High cholesterol: Last LDL 48, no change, continue statins Preventive care: Recommend RSV RTC 4 months

## 2024-01-09 ENCOUNTER — Encounter: Payer: Self-pay | Admitting: Endocrinology

## 2024-01-09 ENCOUNTER — Ambulatory Visit (INDEPENDENT_AMBULATORY_CARE_PROVIDER_SITE_OTHER): Admitting: Endocrinology

## 2024-01-09 ENCOUNTER — Encounter: Payer: Self-pay | Admitting: Internal Medicine

## 2024-01-09 VITALS — BP 132/80 | HR 90 | Resp 20 | Ht 74.0 in | Wt 208.8 lb

## 2024-01-09 DIAGNOSIS — Z794 Long term (current) use of insulin: Secondary | ICD-10-CM | POA: Diagnosis not present

## 2024-01-09 DIAGNOSIS — E1165 Type 2 diabetes mellitus with hyperglycemia: Secondary | ICD-10-CM

## 2024-01-09 NOTE — Progress Notes (Signed)
 Outpatient Endocrinology Note [place] Leanora Murin, MD  01/09/24  Patient's Name: Jonathan [name].    DOB: Mar 09, 1936    MRN: [id number]                                                    REASON OF VISIT: Follow up of type 2 diabetes mellitus  PCP: Ezell Hollow, MD  HISTORY OF PRESENT ILLNESS:   Jonathan Ciolek. is a 88 y.o. old male with past medical history listed below, is here for follow up for type 2 diabetes mellitus.   Pertinent Diabetes History: Patient was diagnosed with type 2 diabetes mellitus in 2009.  Patient has relatively controlled type 2 diabetes mellitus.  Chronic Diabetes Complications : Retinopathy: no. Last ophthalmology exam was done on annually, reportedly, following with ophthalmology regularly.  Nephropathy: CKD III, on ACE/ARB /valsartan  Peripheral neuropathy: yes, on gabapentin . Has PAD. Coronary artery disease: yes. Stroke: no  Relevant comorbidities and cardiovascular risk factors: Obesity: no Body mass index is 26.81 kg/m.  Hypertension: Yes  Hyperlipidemia : Yes, on statin   Current / Home Diabetic regimen includes:  Metformin  500 mg 2 times a day. Trulicity  3.0 mg weekly. Jardiance  25 mg daily. Lantus  15 units daily.  Lantus  was started in March 2025 while eating dexamethasone  IV during chemotherapy treatment for lung cancer.  Prior diabetic medications:  Glycemic data:   Freestyle libre [employer] and reviewed from February 24 2 Abrol 24. Last 1 week fasting blood sugar 137, 136, 184, 138.  Blood sugar in the afternoon 118, 165, 184, 144.  Blood sugar at bedtime 282, 164, 162.  Previous week he had fasting blood sugar as low as 96, 95, 99.  Blood sugar at bedtime 289, 209.  Hypoglycemia: Patient has no hypoglycemic episodes. Patient has hypoglycemia awareness.  Factors modifying glucose control: 1.  Diabetic diet assessment: 3 meals a day.  2.  Staying active or exercising: No formal exercise per physically active at  home take care of his demented wife.  3.  Medication compliance: compliant all of the time.  # HYPOGONADISM He had decreased libido and erectile dysfunction previously and was found to have hypogonadism, probably in 2011.He did have a slightly low free testosterone  level in 2012 on treatment. Prolactin level normal.  Previously on AndroGel . With his hemoglobin going up to 19.1 in August 2019 he has been told not to take any testosterone . He was given clomiphene  as a trial but he did not try this. Testosterone  level is persistently low without testosterone  therapy although only mildly decreased now. He has had some fatigue overall but not significant now. Because of his history of erythrocytosis will not start any supplement. He has been followed by hematologist for erythrocytosis.  Note : He was diagnosed with lung cancer in January 2025, following with oncology and was on chemotherapy and radiation therapy.  He received dexamethasone  with chemotherapy.  Interval history  Diabetes regimen as reviewed and noted above.  Glucometer data as reviewed above.  Overall mild hyperglycemia.  He is still on Lantus  15 units daily.  His last dose of dexamethasone  was March 31.  Patient reports he has follow-up with oncology in the beginning of next month.    No other complaints today.   REVIEW OF SYSTEMS As per history of present illness.  PAST MEDICAL HISTORY: Past Medical History:  Diagnosis Date   CAD (coronary artery disease)    Two RCA stents remotely / 3rd RCA stent 2006   Colon polyps    s/p several Cscopes.   COPD (chronic obstructive pulmonary disease) (HCC)    on O2, nocturnal   Diabetes mellitus    dx aprox 2009   ED (erectile dysfunction)    has a vacumm device   Ejection fraction    Hyperlipidemia    dx in 90s   Hypertension    dx in the 39   Hypogonadism male    PVD (peripheral vascular disease) (HCC)    s/p stents at LE 2009, Dr Berry [place]   Shortness of breath    O2 Sat  dropped to 82% walking on the treadmill, September, 2012    PAST SURGICAL HISTORY: Past Surgical History:  Procedure Laterality Date   APPENDECTOMY     BRONCHIAL BIOPSY  10/01/2023   Procedure: BRONCHIAL BIOPSIES;  Surgeon: Prudy Brownie, DO;  [facility]: [facility];  Service: Pulmonary;;   BRONCHIAL BRUSHINGS  10/01/2023   Procedure: BRONCHIAL BRUSHINGS;  Surgeon: Prudy Brownie, DO;  [facility]: [facility];  Service: Pulmonary;;   BRONCHIAL WASHINGS  10/01/2023   Procedure: BRONCHIAL WASHINGS;  Surgeon: Prudy Brownie, DO;  [facility];  Service: Pulmonary;;   CORONARY STENT INTERVENTION N/A 01/12/2022   Procedure: CORONARY STENT INTERVENTION;  Surgeon: Swaziland, Peter M, MD;  [facility];  Service: Cardiovascular;  Laterality: N/A;   HEMOSTASIS CONTROL  10/01/2023   Procedure: HEMOSTASIS CONTROL;  Surgeon: Prudy Brownie, DO;  [facility]: [facility];  Service: Pulmonary;;   LEFT HEART CATH AND CORONARY ANGIOGRAPHY N/A 03/13/2023   Procedure: LEFT HEART CATH AND CORONARY ANGIOGRAPHY;  Surgeon: Swaziland, Peter M, MD;  [facility] LAB;  Service: Cardiovascular;  Laterality: N/A;   RIGHT/LEFT HEART CATH AND CORONARY ANGIOGRAPHY N/A 01/11/2022   Procedure: RIGHT/LEFT HEART CATH AND CORONARY ANGIOGRAPHY;  Surgeon: Arty Binning, MD;  [facility];  Service: Cardiovascular;  Laterality: N/A;   TONSILLECTOMY     VIDEO BRONCHOSCOPY  10/01/2023   Procedure: VIDEO BRONCHOSCOPY WITHOUT FLUORO;  Surgeon: Prudy Brownie, DO;  Location: [facility];  Service: Pulmonary;;    ALLERGIES: No Known Allergies  FAMILY HISTORY:  Family History  Problem Relation Age of Onset   Heart disease Father    Hypertension Father    Stroke Father    Diabetes Paternal Aunt    Diabetes Maternal Grandmother    Diabetes Other        GM, nephews, many family members   Hyperlipidemia Other        ?   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   Tobacco Use   Smoking status: Former    Current packs/day: 0.00    Average packs/day: 2.0 packs/day for 30.0 years (60.0 ttl pk-yrs)    Types: Cigarettes    Start date: 06/18/1947    Quit date: 06/17/1977    Years since quitting: 46.5   Smokeless tobacco: Never   Tobacco comments:  2 ppd, quit 1978  Vaping Use   Vaping status: Never Used  Substance and Sexual Activity   Alcohol use: Not Currently   Drug use: No   Sexual activity: Yes  Other Topics Concern   Not on file  Social History Narrative   4 children (lost 1 son)   Wife has 2 children                Social Drivers of Corporate investment banker Strain: Low Risk  (12/17/2023)   Overall Financial Resource Strain (CARDIA)    Difficulty of Paying Living Expenses: Not hard at all  Food Insecurity: No Food Insecurity (12/17/2023)   Hunger Vital Sign    Worried About Running Out of Food in the Last Year: Never true    Ran Out of Food in the Last Year: Never true  Transportation Needs: No Transportation Needs (12/17/2023)   PRAPARE - Administrator, Civil Service (Medical): No    Lack of Transportation (Non-Medical): No  Physical Activity: Inactive (12/17/2023)   Exercise Vital Sign    Days of Exercise per Week: 0 days    Minutes of Exercise per Session: 0 min  Stress: No Stress Concern Present (12/17/2023)   [employer] of Occupational Health - Occupational Stress Questionnaire    Feeling of Stress : Not at all  Social Connections: Socially Integrated (12/17/2023)   Social Connection and Isolation Panel [NHANES]    Frequency of Communication with Friends and Family: More than three times a week    Frequency  of Social Gatherings with Friends and Family: More than three times a week    Attends Religious Services: More than 4 times per year    Active Member of [employer] or Organizations: Yes    Attends Engineer, structural: More than 4 times per year    Marital Status: Married    MEDICATIONS:  Current Outpatient Medications  Medication Sig Dispense Refill   albuterol  (VENTOLIN  HFA) 108 (90 Base) MCG/ACT inhaler USE 1 TO 2 INHALATIONS EVERY 6 HOURS AS NEEDED FOR WHEEZING OR SHORTNESS OF BREATH 34 g 3   Blood Glucose Monitoring Suppl (FREESTYLE FREEDOM LITE) w/Device KIT Use Freestyle Freedom Lite meter to check blood sugar twice daily. DX:E11.65 1 kit 0   clopidogrel  (PLAVIX ) 75 MG tablet Take 1 tablet (75 mg total) by mouth daily with breakfast. 90 tablet 1   Dulaglutide  (TRULICITY ) 3 MG/0.5ML SOAJ INJECT 3 MG UNDER THE SKIN ONCE A WEEK 6 mL 3   empagliflozin  (JARDIANCE ) 25 MG TABS tablet Take 1 tablet (25 mg total) by mouth daily before breakfast. For diabetes E11.65 and E 11.29 90 tablet 3   furosemide  (LASIX ) 40 MG tablet Take 1 tablet (40 mg total) by mouth daily. 90 tablet 1   gabapentin  (NEURONTIN ) 100 MG capsule Take 100 mg by mouth 2 (two) times daily.     glucose blood (FREESTYLE LITE) test strip USE AS INSTRUCTED 100 strip 11   hydrOXYzine  (ATARAX ) 25 MG tablet Take 1 tablet by mouth at bedtime as needed.     insulin  glargine (LANTUS  SOLOSTAR) 100 UNIT/ML Solostar Pen Inject 15 Units into the skin daily. 15 mL 11   Insulin  Pen Needle 32G X 4 MM MISC Use 1 x a day 100 each 3   metFORMIN  (GLUCOPHAGE ) 500 MG tablet Take 1 tablet (500 mg total) by mouth 2 (two) times daily with a meal. 180 tablet 3   metoprolol  (TOPROL -XL) 200 MG 24 hr tablet  Take 100 mg by mouth daily.     Multiple Vitamin (MULTIVITAMIN WITH MINERALS) TABS tablet Take 1 tablet by mouth daily.     ondansetron  (ZOFRAN ) 8 MG tablet Take 1 tablet (8 mg total) by mouth every 8 (eight) hours as needed for nausea or vomiting.  Start on the third day after chemotherapy. 30 tablet 1   OXYGEN  Inhale 2 L/min into the lungs at bedtime. PRN during day     prochlorperazine  (COMPAZINE ) 10 MG tablet Take 1 tablet (10 mg total) by mouth every 6 (six) hours as needed for nausea or vomiting. 30 tablet 1   sacubitril -valsartan  (ENTRESTO ) 24-26 MG Take 1 tablet by mouth 2 (two) times daily. 60 tablet 0   simvastatin  (ZOCOR ) 20 MG tablet Take 20 mg by mouth daily.     sucralfate  (CARAFATE ) 1 g tablet Take 1 tablet (1 g total) by mouth 4 (four) times daily -  with meals and at bedtime. 5 min before meals for radiation induced esophagitis 120 tablet 2   tamsulosin  (FLOMAX ) 0.4 MG CAPS capsule Take 1 capsule (0.4 mg total) by mouth daily after supper. 90 capsule 1   Tiotropium Bromide -Olodaterol (STIOLTO RESPIMAT ) 2.5-2.5 MCG/ACT AERS Inhale 2 puffs into the lungs daily. 12 g 4   No current facility-administered medications for this visit.    PHYSICAL EXAM: Vitals:   01/09/24 0817  BP: 132/80  Pulse: 90  Resp: 20  SpO2: 95%  Weight: 208 lb 12.8 oz (94.7 kg)  Height: 6\' 2"  (1.88 m)    Body mass index is 26.81 kg/m.  Wt Readings from Last 3 Encounters:  01/09/24 208 lb 12.8 oz (94.7 kg)  12/31/23 213 lb (96.6 kg)  12/23/23 213 lb 1.6 oz (96.7 kg)    General: Well developed, well nourished male in no apparent distress.  HEENT: AT/Levasy, no external lesions.  Eyes: Conjunctiva clear and no icterus. Neck: Neck supple  Lungs: Respirations not labored Neurologic: Alert, oriented, normal speech Extremities / Skin: Dry.   Psychiatric: Does not appear depressed or anxious  Diabetic Foot Exam - Simple   No data filed    LABS Reviewed Lab Results  Component Value Date   HGBA1C 7.9 (H) 11/28/2023   HGBA1C 6.9 (H) 09/09/2023   HGBA1C 6.4 (A) 08/01/2023   Lab Results  Component Value Date   FRUCTOSAMINE 324 (H) 11/28/2023   FRUCTOSAMINE 250 04/26/2023   FRUCTOSAMINE 319 (H) 12/28/2021   Lab Results  Component Value  Date   CHOL 108 04/01/2023   HDL 43.40 04/01/2023   LDLCALC 48 04/01/2023   LDLDIRECT 55.0 01/01/2018   TRIG 83.0 04/01/2023   CHOLHDL 2 04/01/2023   Lab Results  Component Value Date   MICRALBCREAT 18.3 04/01/2023   MICRALBCREAT 8.1 04/09/2022   Lab Results  Component Value Date   CREATININE 0.94 12/23/2023   Lab Results  Component Value Date   GFR 40.58 (L) 08/02/2023    ASSESSMENT / PLAN  1. Uncontrolled type 2 diabetes mellitus with hyperglycemia, with long-term current use of insulin  (HCC)     Diabetes Mellitus type 2, complicated by peripheral neuropathy/CAD/CKD. - Diabetic status / severity: uncontrolled.  Lab Results  Component Value Date   HGBA1C 7.9 (H) 11/28/2023    - Hemoglobin A1c goal : <7%  He has relatively controlled type 2 diabetes mellitus.  Recent worsening of diabetes control while receiving systemic steroids/dexamethasone  as a part of chemotherapy for lung cancer.  Basal insulin  Lantus  was started in March.  He still  has mild hyperglycemia I would like to continue basal insulin  however decrease the dose as follows.  Ultimate plan is to stop insulin  completely.  - Medications: See below. Decrease Lantus  from 15 to 10 units daily in the morning. Continue metformin  500 mg 2 times a day. Continue Trulicity  3.0 mg weekly. Continue Jardiance  25 mg daily.  - Home glucose testing: Advised to check blood sugar in the morning fasting and bedtime.  Okay to have blood sugar 200-300 range to have permissive hyperglycemia while being on dexamethasone  for chemotherapy treatment. - Discussed/ Gave Hypoglycemia treatment plan.  # Consult : not required at this time.   # Annual urine for microalbuminuria/ creatinine ratio, no microalbuminuria currently, continue ACE/ARB /valsartan . Last  Lab Results  Component Value Date   MICRALBCREAT 18.3 04/01/2023    # Foot check nightly / neuropathy, continue gabapentin , managed by PCP.  # Annual dilated  diabetic eye exams.   - Diet: Make healthy diabetic food choices - Life style / activity / exercise: Discussed.  2. Blood pressure  -  BP Readings from Last 1 Encounters:  01/09/24 132/80    - Control is in target.  - No change in current plans.  Managed by PCP.  3. Lipid status / Hyperlipidemia - Last  Lab Results  Component Value Date   LDLCALC 48 04/01/2023   - Continue simvastatin  20 mg daily.  Managed by PCP.  Diagnoses and all orders for this visit:  Uncontrolled type 2 diabetes mellitus with hyperglycemia, with long-term current use of insulin  (HCC)    DISPOSITION Follow up in clinic in 2 months suggested.   All questions answered and patient verbalized understanding of the plan.  Iraq Roberts Bon, MD [facility] Endocrinology [facility] [address] [place], [address] [place], [place] [address] Phone # [phone]  At least part of this note was generated using voice recognition software. Inadvertent word errors may have occurred, which were not recognized during the proofreading process.

## 2024-01-09 NOTE — Patient Instructions (Signed)
 Decrease Lantus  10 units daily. Rest medications same.   Check glucose at least twice a day, in the morning fasting and at bedtime.  Okay to have glucose 200-300 range when on Dexamethasone  as part of chemotherapy.

## 2024-01-16 ENCOUNTER — Ambulatory Visit (HOSPITAL_COMMUNITY)
Admission: RE | Admit: 2024-01-16 | Discharge: 2024-01-16 | Disposition: A | Discharge status: Home / Self Care | Source: Ambulatory Visit | Attending: Physician Assistant | Admitting: Physician Assistant

## 2024-01-16 ENCOUNTER — Encounter (HOSPITAL_COMMUNITY): Payer: Self-pay

## 2024-01-16 DIAGNOSIS — C3412 Malignant neoplasm of upper lobe, left bronchus or lung: Secondary | ICD-10-CM | POA: Insufficient documentation

## 2024-01-16 DIAGNOSIS — I3139 Other pericardial effusion (noninflammatory): Secondary | ICD-10-CM | POA: Diagnosis not present

## 2024-01-16 DIAGNOSIS — R918 Other nonspecific abnormal finding of lung field: Secondary | ICD-10-CM | POA: Diagnosis not present

## 2024-01-16 DIAGNOSIS — J439 Emphysema, unspecified: Secondary | ICD-10-CM | POA: Diagnosis not present

## 2024-01-16 MED ORDER — IOHEXOL 300 MG/ML  SOLN
75.0000 mL | Freq: Once | INTRAMUSCULAR | Status: AC | PRN
  Administered 2024-01-16: 75 mL via INTRAVENOUS

## 2024-01-23 ENCOUNTER — Inpatient Hospital Stay: Admitting: Internal Medicine

## 2024-01-23 ENCOUNTER — Inpatient Hospital Stay: Attending: Internal Medicine

## 2024-01-23 VITALS — BP 147/89 | HR 93 | Temp 98.0°F | Resp 17 | Ht 74.0 in | Wt 207.0 lb

## 2024-01-23 DIAGNOSIS — E1151 Type 2 diabetes mellitus with diabetic peripheral angiopathy without gangrene: Secondary | ICD-10-CM | POA: Insufficient documentation

## 2024-01-23 DIAGNOSIS — E785 Hyperlipidemia, unspecified: Secondary | ICD-10-CM | POA: Insufficient documentation

## 2024-01-23 DIAGNOSIS — I700 Atherosclerosis of aorta: Secondary | ICD-10-CM | POA: Diagnosis not present

## 2024-01-23 DIAGNOSIS — Z860100 Personal history of colon polyps, unspecified: Secondary | ICD-10-CM | POA: Insufficient documentation

## 2024-01-23 DIAGNOSIS — Y842 Radiological procedure and radiotherapy as the cause of abnormal reaction of the patient, or of later complication, without mention of misadventure at the time of the procedure: Secondary | ICD-10-CM | POA: Insufficient documentation

## 2024-01-23 DIAGNOSIS — C3412 Malignant neoplasm of upper lobe, left bronchus or lung: Secondary | ICD-10-CM

## 2024-01-23 DIAGNOSIS — Z923 Personal history of irradiation: Secondary | ICD-10-CM | POA: Diagnosis not present

## 2024-01-23 DIAGNOSIS — Z79899 Other long term (current) drug therapy: Secondary | ICD-10-CM | POA: Insufficient documentation

## 2024-01-23 DIAGNOSIS — R590 Localized enlarged lymph nodes: Secondary | ICD-10-CM | POA: Diagnosis not present

## 2024-01-23 DIAGNOSIS — Z794 Long term (current) use of insulin: Secondary | ICD-10-CM | POA: Insufficient documentation

## 2024-01-23 DIAGNOSIS — Z7985 Long-term (current) use of injectable non-insulin antidiabetic drugs: Secondary | ICD-10-CM | POA: Insufficient documentation

## 2024-01-23 DIAGNOSIS — Z5111 Encounter for antineoplastic chemotherapy: Secondary | ICD-10-CM | POA: Insufficient documentation

## 2024-01-23 DIAGNOSIS — I2510 Atherosclerotic heart disease of native coronary artery without angina pectoris: Secondary | ICD-10-CM | POA: Insufficient documentation

## 2024-01-23 DIAGNOSIS — J439 Emphysema, unspecified: Secondary | ICD-10-CM | POA: Diagnosis not present

## 2024-01-23 DIAGNOSIS — K208 Other esophagitis without bleeding: Secondary | ICD-10-CM | POA: Insufficient documentation

## 2024-01-23 DIAGNOSIS — Z7984 Long term (current) use of oral hypoglycemic drugs: Secondary | ICD-10-CM | POA: Insufficient documentation

## 2024-01-23 DIAGNOSIS — I10 Essential (primary) hypertension: Secondary | ICD-10-CM | POA: Insufficient documentation

## 2024-01-23 DIAGNOSIS — Z7902 Long term (current) use of antithrombotics/antiplatelets: Secondary | ICD-10-CM | POA: Insufficient documentation

## 2024-01-23 DIAGNOSIS — I3139 Other pericardial effusion (noninflammatory): Secondary | ICD-10-CM | POA: Diagnosis not present

## 2024-01-23 LAB — CBC WITH DIFFERENTIAL (CANCER CENTER ONLY)
Abs Immature Granulocytes: 0.01 10*3/uL (ref 0.00–0.07)
Basophils Absolute: 0.1 10*3/uL (ref 0.0–0.1)
Basophils Relative: 1 %
Eosinophils Absolute: 0.1 10*3/uL (ref 0.0–0.5)
Eosinophils Relative: 2 %
HCT: 41.2 % (ref 39.0–52.0)
Hemoglobin: 13.9 g/dL (ref 13.0–17.0)
Immature Granulocytes: 0 %
Lymphocytes Relative: 34 %
Lymphs Abs: 2.5 10*3/uL (ref 0.7–4.0)
MCH: 31.3 pg (ref 26.0–34.0)
MCHC: 33.7 g/dL (ref 30.0–36.0)
MCV: 92.8 fL (ref 80.0–100.0)
Monocytes Absolute: 1 10*3/uL (ref 0.1–1.0)
Monocytes Relative: 14 %
Neutro Abs: 3.7 10*3/uL (ref 1.7–7.7)
Neutrophils Relative %: 49 %
Platelet Count: 215 10*3/uL (ref 150–400)
RBC: 4.44 MIL/uL (ref 4.22–5.81)
RDW: 20.2 % — ABNORMAL HIGH (ref 11.5–15.5)
WBC Count: 7.3 10*3/uL (ref 4.0–10.5)
nRBC: 0 % (ref 0.0–0.2)

## 2024-01-23 LAB — CMP (CANCER CENTER ONLY)
ALT: 9 U/L (ref 0–44)
AST: 15 U/L (ref 15–41)
Albumin: 3.6 g/dL (ref 3.5–5.0)
Alkaline Phosphatase: 68 U/L (ref 38–126)
Anion gap: 4 — ABNORMAL LOW (ref 5–15)
BUN: 12 mg/dL (ref 8–23)
CO2: 32 mmol/L (ref 22–32)
Calcium: 9.2 mg/dL (ref 8.9–10.3)
Chloride: 107 mmol/L (ref 98–111)
Creatinine: 1.29 mg/dL — ABNORMAL HIGH (ref 0.61–1.24)
GFR, Estimated: 54 mL/min — ABNORMAL LOW (ref >60)
Glucose, Bld: 110 mg/dL — ABNORMAL HIGH (ref 70–99)
Potassium: 3.4 mmol/L — ABNORMAL LOW (ref 3.5–5.1)
Sodium: 143 mmol/L (ref 135–145)
Total Bilirubin: 0.5 mg/dL (ref 0.0–1.2)
Total Protein: 7 g/dL (ref 6.5–8.1)

## 2024-01-23 NOTE — Progress Notes (Signed)
 [facility] [phone]   [phone]  OFFICE PROGRESS NOTE  Ezell Hollow, MD [address] [place] [address]  DIAGNOSIS: stage IIIa (T3, N2, M0) non-small cell lung cancer, squamous cell carcinoma presented with left upper lobe lung mass with suspicious mediastinal lymphadenopathy diagnosed in January 2025   PRIOR THERAPY: Concurrent chemoradiation with weekly carboplatin  for AUC of 2 and paclitaxel  45 Mg/M2.  Status post 7 cycles.  Last dose was given 12/23/2023.  CURRENT THERAPY: Consolidation immunotherapy with Imfinzi 1500 Mg IV every 4 weeks.  First dose 01/28/2024.  INTERVAL HISTORY: Jaxstin Zenisek. 88 y.o. male returns to the clinic today for follow-up visit accompanied by his sister. Discussed the use of AI scribe software for clinical note transcription with the patient, who gave verbal consent to proceed.  History of Present Illness   Baylee Whitehall. is an 88 year old male with stage three non-small cell lung cancer who presents for evaluation with repeat CT scan for restaging of his disease. He is accompanied by his sister, Tia Flowers.  He has completed a course of concurrent chemoradiation with weekly carboplatin  and paclitaxel . A recent CT scan of the chest shows significant reduction in the size of the left upper lobe speculated nodule/mass that was extending into the hilum, and a stable six millimeter right-sided lung nodule.  He feels generally well, stating 'I'm doing good. I feel good.' He experiences fatigue, noting that he 'gets tired fast' but is able to 'get up and do what I need to do.' He also reports issues with swallowing, which he attributes to radiation-induced esophagitis, but notes that it is 'getting better.'  He has lost two pounds since his last visit, although he mentions having gained some weight recently. He is breathing well and does not report any new respiratory symptoms.        MEDICAL  HISTORY: Past Medical History:  Diagnosis Date   CAD (coronary artery disease)    Two RCA stents remotely / 3rd RCA stent 2006   Colon polyps    s/p several Cscopes.   COPD (chronic obstructive pulmonary disease) (HCC)    on O2, nocturnal   Diabetes mellitus    dx aprox 2009   ED (erectile dysfunction)    has a vacumm device   Ejection fraction    Hyperlipidemia    dx in 90s   Hypertension    dx in the 49   Hypogonadism male    PVD (peripheral vascular disease) (HCC)    s/p stents at LE 2009, Dr Berry [place]   Shortness of breath    O2 Sat dropped to 82% walking on the treadmill, September, 2012    ALLERGIES:  has no known allergies.  MEDICATIONS:  Current Outpatient Medications  Medication Sig Dispense Refill   albuterol  (VENTOLIN  HFA) 108 (90 Base) MCG/ACT inhaler USE 1 TO 2 INHALATIONS EVERY 6 HOURS AS NEEDED FOR WHEEZING OR SHORTNESS OF BREATH 34 g 3   Blood Glucose Monitoring Suppl (FREESTYLE FREEDOM LITE) w/Device KIT Use Freestyle Freedom Lite meter to check blood sugar twice daily. DX:E11.65 1 kit 0   clopidogrel  (PLAVIX ) 75 MG tablet Take 1 tablet (75 mg total) by mouth daily with breakfast. 90 tablet 1   Dulaglutide  (TRULICITY ) 3 MG/0.5ML SOAJ INJECT 3 MG UNDER THE SKIN ONCE A WEEK 6 mL 3   empagliflozin  (JARDIANCE ) 25 MG TABS tablet Take 1 tablet (25 mg total) by mouth  daily before breakfast. For diabetes E11.65 and E 11.29 90 tablet 3   furosemide  (LASIX ) 40 MG tablet Take 1 tablet (40 mg total) by mouth daily. 90 tablet 1   gabapentin  (NEURONTIN ) 100 MG capsule Take 100 mg by mouth 2 (two) times daily.     glucose blood (FREESTYLE LITE) test strip USE AS INSTRUCTED 100 strip 11   hydrOXYzine  (ATARAX ) 25 MG tablet Take 1 tablet by mouth at bedtime as needed.     insulin  glargine (LANTUS  SOLOSTAR) 100 UNIT/ML Solostar Pen Inject 15 Units into the skin daily. 15 mL 11   Insulin  Pen Needle 32G X 4 MM MISC Use 1 x a day 100 each 3   metFORMIN  (GLUCOPHAGE ) 500 MG tablet Take  1 tablet (500 mg total) by mouth 2 (two) times daily with a meal. 180 tablet 3   metoprolol  (TOPROL -XL) 200 MG 24 hr tablet Take 100 mg by mouth daily.     Multiple Vitamin (MULTIVITAMIN WITH MINERALS) TABS tablet Take 1 tablet by mouth daily.     ondansetron  (ZOFRAN ) 8 MG tablet Take 1 tablet (8 mg total) by mouth every 8 (eight) hours as needed for nausea or vomiting. Start on the third day after chemotherapy. 30 tablet 1   OXYGEN  Inhale 2 L/min into the lungs at bedtime. PRN during day     prochlorperazine  (COMPAZINE ) 10 MG tablet Take 1 tablet (10 mg total) by mouth every 6 (six) hours as needed for nausea or vomiting. 30 tablet 1   sacubitril -valsartan  (ENTRESTO ) 24-26 MG Take 1 tablet by mouth 2 (two) times daily. 60 tablet 0   simvastatin  (ZOCOR ) 20 MG tablet Take 20 mg by mouth daily.     sucralfate  (CARAFATE ) 1 g tablet Take 1 tablet (1 g total) by mouth 4 (four) times daily -  with meals and at bedtime. 5 min before meals for radiation induced esophagitis 120 tablet 2   tamsulosin  (FLOMAX ) 0.4 MG CAPS capsule Take 1 capsule (0.4 mg total) by mouth daily after supper. 90 capsule 1   Tiotropium Bromide -Olodaterol (STIOLTO RESPIMAT ) 2.5-2.5 MCG/ACT AERS Inhale 2 puffs into the lungs daily. 12 g 4   No current facility-administered medications for this visit.    SURGICAL HISTORY:  Past Surgical History:  Procedure Laterality Date   APPENDECTOMY     BRONCHIAL BIOPSY  10/01/2023   Procedure: BRONCHIAL BIOPSIES;  Surgeon: Prudy Brownie, DO;  Location: [facility];  Service: Pulmonary;;   BRONCHIAL BRUSHINGS  10/01/2023   Procedure: BRONCHIAL BRUSHINGS;  Surgeon: Prudy Brownie, DO;  Location: [facility];  Service: Pulmonary;;   BRONCHIAL WASHINGS  10/01/2023   Procedure: BRONCHIAL WASHINGS;  Surgeon: Prudy Brownie, DO;  Location: [facility];  Service: Pulmonary;;   CORONARY STENT INTERVENTION N/A 01/12/2022   Procedure: CORONARY STENT INTERVENTION;  Surgeon: Swaziland, Peter M, MD;   Location: [facility];  Service: Cardiovascular;  Laterality: N/A;   HEMOSTASIS CONTROL  10/01/2023   Procedure: HEMOSTASIS CONTROL;  Surgeon: Prudy Brownie, DO;  Location: [facility];  Service: Pulmonary;;   LEFT HEART CATH AND CORONARY ANGIOGRAPHY N/A 03/13/2023   Procedure: LEFT HEART CATH AND CORONARY ANGIOGRAPHY;  Surgeon: Swaziland, Peter M, MD;  Location: [facility] INVASIVE CV LAB;  Service: Cardiovascular;  Laterality: N/A;   RIGHT/LEFT HEART CATH AND CORONARY ANGIOGRAPHY N/A 01/11/2022   Procedure: RIGHT/LEFT HEART CATH AND CORONARY ANGIOGRAPHY;  Surgeon: Arty Binning, MD;  Location: [facility];  Service: Cardiovascular;  Laterality: N/A;  TONSILLECTOMY     VIDEO BRONCHOSCOPY  10/01/2023   Procedure: VIDEO BRONCHOSCOPY WITHOUT FLUORO;  Surgeon: Prudy Brownie, DO;  Location: [facility];  Service: Pulmonary;;    REVIEW OF SYSTEMS:  Constitutional: negative Eyes: negative Ears, nose, mouth, throat, and face: negative Respiratory: negative Cardiovascular: negative Gastrointestinal: positive for odynophagia Genitourinary:negative Integument/breast: negative Hematologic/lymphatic: negative Musculoskeletal:negative Neurological: negative Behavioral/Psych: negative Endocrine: negative Allergic/Immunologic: negative   PHYSICAL EXAMINATION: General appearance: alert, cooperative, and no distress Head: Normocephalic, without obvious abnormality, atraumatic Neck: no adenopathy, no JVD, supple, symmetrical, trachea midline, and thyroid  not enlarged, symmetric, no tenderness/mass/nodules Lymph nodes: Cervical, supraclavicular, and axillary nodes normal. Resp: clear to auscultation bilaterally Back: symmetric, no curvature. ROM normal. No CVA tenderness. Cardio: regular rate and rhythm, S1, S2 normal, no murmur, click, rub or gallop GI: soft, non-tender; bowel sounds normal; no masses,  no organomegaly Extremities: extremities normal, atraumatic, no cyanosis or  edema Neurologic: Alert and oriented X 3, normal strength and tone. Normal symmetric reflexes. Normal coordination and gait  ECOG PERFORMANCE STATUS: 1 - Symptomatic but completely ambulatory  Blood pressure (!) 147/89, pulse 93, temperature 98 F (36.7 C), temperature source Temporal, resp. rate 17, height 6\' 2"  (1.88 m), weight 207 lb (93.9 kg), SpO2 96%.  LABORATORY DATA: Lab Results  Component Value Date   WBC 7.3 01/23/2024   HGB 13.9 01/23/2024   HCT 41.2 01/23/2024   MCV 92.8 01/23/2024   PLT 215 01/23/2024      Chemistry      Component Value Date/Time   NA 143 01/23/2024 1238   NA 144 01/01/2024 0000   K 3.4 (L) 01/23/2024 1238   CL 107 01/23/2024 1238   CO2 32 01/23/2024 1238   BUN 12 01/23/2024 1238   BUN 14 01/01/2024 0000   CREATININE 1.29 (H) 01/23/2024 1238   CREATININE 1.04 11/28/2023 0803   GLU 87 01/01/2024 0000      Component Value Date/Time   CALCIUM 9.2 01/23/2024 1238   ALKPHOS 68 01/23/2024 1238   AST 15 01/23/2024 1238   ALT 9 01/23/2024 1238   BILITOT 0.5 01/23/2024 1238       RADIOGRAPHIC STUDIES: CT Chest W Contrast Result Date: 01/17/2024 CLINICAL DATA:  Assess treatment response non-small-cell lung cancer. Chemotherapy and XRT complete. Cough * Tracking Code: BO * EXAM: CT CHEST WITH CONTRAST TECHNIQUE: Multidetector CT imaging of the chest was performed during intravenous contrast administration. RADIATION DOSE REDUCTION: This exam was performed according to the departmental dose-optimization program which includes automated exposure control, adjustment of the mA and/or kV according to patient size and/or use of iterative reconstruction technique. CONTRAST:  75mL OMNIPAQUE  IOHEXOL  300 MG/ML  SOLN COMPARISON:  CTA chest 11/11/2023. PET-CT 10/25/2023. Older exams as well. FINDINGS: Cardiovascular: Thoracic aorta has a normal course and caliber with partially calcified mild-to-moderate plaque. Prominent coronary artery calcifications are seen. The  heart is nonenlarged. Small pericardial effusion, slightly increased from previous PET-CT but similar to the more recent CTA. Mediastinum/Nodes: Small thyroid  gland. Mildly patulous esophagus. There is diffuse wall thickening along the esophagus particularly at the level of the carina. Please correlate with symptoms. This is increased from previous slightly. Lungs/Pleura: Emphysematous lung changes are identified. No pleural effusion or pneumothorax. The right lung there is a small nodule along the course of the minor fissure which is stable measuring 6 mm on series 8, image 88. The masslike opacity in the left upper lobe extending to the hilum with associated distortion and spiculation is much smaller today.  Previously this area measured up to 4.6 x 5.4 cm and today 4.5 x 1.9 cm on series 8, image 90. No new dominant lung mass. Upper Abdomen: Upper pole right-sided renal cyst again identified. Left-sided nodule today measures 20 x 14 mm and on prior examination 20 x 14 mm. Right-sided foci are also similar. Please correlate with history and prior workup. Musculoskeletal: Mild curvature of the spine with degenerative changes. IMPRESSION: Significant reduction in size of the left upper lobe spiculated nodule/mass extending to the hilum. Stable 6 mm right-sided lung nodule. No developing new abnormal lymph node enlargement. Stable adrenal nodules. Recommend additional follow up for workup if there is no prior history of such. Increasing wall thickening along the esophagus. Please correlate for any symptoms of esophagitis. Slight increase in small pericardial effusion. Aortic Atherosclerosis (ICD10-I70.0) and Emphysema (ICD10-J43.9). Electronically Signed   By: Adrianna Horde M.D.   On: 01/17/2024 15:23    ASSESSMENT AND PLAN: This is a very pleasant 88 years old African-American male with stage IIIa (T3, N2, M0) non-small cell lung cancer, squamous cell carcinoma presented with left upper lobe lung mass with  suspicious mediastinal lymphadenopathy diagnosed in January 2025  He underwent a course of concurrent chemoradiation with weekly carboplatin  for AUC of 2 and paclitaxel  45 Mg/M2 status post 7 cycles.  Last dose was given 12/23/2023. He had repeat CT scan of the chest performed recently.  I personally and independently reviewed the scan images and discussed the result and showed the images to the patient and his sister.  His scan showed significant reduction in the size of the left upper lobe lung mass.    Stage 3 non-small cell lung cancer Stage 3 non-small cell lung cancer with significant reduction in the size of the left upper lobe speculated nodule/mass extending into the hilum following concurrent chemoradiation with carboplatin  and paclitaxel . The 6 mm right-sided lung nodule is well-managed. He has shown a good response to treatment. Plan to initiate immunotherapy with Imfinizi 1500 mg IV every 4 weeks to prevent cancer recurrence. Discussed potential side effects including rash, diarrhea, and inflammation in various organs, though these occur in a small percentage of cases. He expressed interest in proceeding with treatment to prevent cancer recurrence. - Initiate immunotherapy infusion every four weeks for one year. - Schedule first immunotherapy session for next Tuesday. - Perform lab work prior to each infusion. - Monitor for potential side effects of immunotherapy, including rash, diarrhea, and inflammation in various organs.  Radiation-induced esophagitis Radiation-induced esophagitis causing odynophagia. Symptoms are improving.   The patient was advised to call immediately if he has any other concerning symptoms in the interval. The patient voices understanding of current disease status and treatment options and is in agreement with the current care plan.  All questions were answered. The patient knows to call the clinic with any problems, questions or concerns. We can certainly see the  patient much sooner if necessary.  The total time spent in the appointment was 40 minutes.  Disclaimer: This note was dictated with voice recognition software. Similar sounding words can inadvertently be transcribed and may not be corrected upon review.

## 2024-01-23 NOTE — Progress Notes (Signed)
 DISCONTINUE ON PATHWAY REGIMEN - Non-Small Cell Lung     A cycle is every 7 days, concurrent with RT:     Paclitaxel       Carboplatin    **Always confirm dose/schedule in your pharmacy ordering system**  REASON: Continuation Of Treatment PRIOR TREATMENT: [id number]: Carboplatin  AUC=2 + Paclitaxel  45 mg/m2 Weekly During Radiation TREATMENT RESPONSE: Partial Response (PR)  START ON PATHWAY REGIMEN - Non-Small Cell Lung     A cycle is every 28 days:     Durvalumab   **Always confirm dose/schedule in your pharmacy ordering system**  Patient Characteristics: Preoperative or Nonsurgical Candidate (Clinical Staging), Stage IIB (N2a only) or Stage III - Nonsurgical Candidate, PS = 0,1 Therapeutic Status: Preoperative or Nonsurgical Candidate (Clinical Staging) AJCC T Category: cT3 AJCC N Category: cN2a AJCC M Category: cM0 AJCC 9 Stage Grouping: IIIA Check here if patient was staged using an edition other than AJCC Staging 9th Edition: true ECOG Performance Status: 1 Intent of Therapy: Curative Intent, Discussed with Patient

## 2024-01-24 ENCOUNTER — Telehealth: Payer: Self-pay | Admitting: Internal Medicine

## 2024-01-24 NOTE — Telephone Encounter (Signed)
 Scheduled and confirmed appointment details with the patient.

## 2024-01-26 NOTE — Progress Notes (Unsigned)
 HPI male former smoker followed for COPD, chronic respiratory failure with hypoxia, complicated by CAD/MI/ Stents/ PAD, DM2, polycythemia PFT- PFT 08/13/18- Diffusion moderately reduced. Normal spirometry flows and lung volumes10/23/12-  Normal spirometry flows with small- airway response to bronchodilator, normal lung volumes and moderately reduced diffusion. FEV1/FVC 0.79, DLCO 58% -06/18/12- 96%, 88%, 94% 449 M. Significant desaturation with exercise. Overnight Oximetry 02/07/17-room air-sustained desaturation less than or equal to 88% for over 7 hours Walk Test 2019-  Walk Test POC qualifying 05/06/23- Did not qualify for portable O2. Lowest saturation was 90% on room air. ------------------------------------------------------------------------------------------------------------  09/30/23-88 year old male former smoker (60 pk yr) followed for COPD, chronic respiratory failure with Hypoxia,, complicated by CAD/MI/ stents/ PAD,CHF, H DM2, PVD,  O2 2 L/ APS-sleep  -Albuterol  hfa, Stiolto 2.5,  Body weight today  Arrival O2 sat  Hosp f/u- hemoptysis, Plavix  held. LUL mass/ consolidation. Discharged on O2 3L, prednisone  taper ends today> needs  bronchoscopy for LUL mass. Bronchoscopy is scheduled for tomorrow morning with Dr Fulton Job. I reviewed the CT images with him and answered questions. Plavix  is on hold for bronch and he has not noted more bleeding. He rarely feels need for his inhalers- we discussed indications. He has a home concentrator and POC (arrival O2 sat today on 3L pulse was 96%). He asks about an even smaller portable concentrator. I suggested he call [facility] to discuss options, but explained the management of his lung mass would take priority. I anticipate Dr Fulton Job will likely follow-up the bronchoscopy results and make referral to Oncology, but she can communicate any other thoughts. CT chest 09/18/22 IMPRESSION: 1. Persistent aggressive appearing mass in the central aspect of  the left upper lobe highly concerning for primary bronchogenic neoplasm. Surrounding postobstructive pneumonia has improved, with residual areas of postobstructive pneumonitis in the left upper lobe noted on today's examination. 2. Trace right pleural effusion. 3. Aortic atherosclerosis, in addition to left main and three-vessel coronary artery disease. 4. Trace amount of pericardial fluid and/or thickening, unlikely to be of any hemodynamic significance at this time. 5. Diffuse bronchial wall thickening with moderate centrilobular and paraseptal emphysema; imaging findings suggestive of underlying COPD. Aortic Atherosclerosis (ICD10-I70.0) and Emphysema (ICD10-J43.9).   01/28/24- 88 year old male former smoker (60 pk yr) followed for COPD, chronic respiratory failure with Hypoxia,, complicated by CAD/MI/ stents/ PAD,CHF, H DM2, PVD,  stage IIIa (T3, N2, M0) non-small cell Lung cancer, squamous cell carcinoma presented with left upper lobe lung mass with suspicious mediastinal lymphadenopathy diagnosed in January 2025  O2 2 L/ APS-sleep  -Albuterol  hfa, Stiolto 2.5,  Oncology follows for chemo/rad/immunotherapy CT chest 01/16/24 IMPRESSION: Significant reduction in size of the left upper lobe spiculated nodule/mass extending to the hilum.   Stable 6 mm right-sided lung nodule.   No developing new abnormal lymph node enlargement.   Stable adrenal nodules. Recommend additional follow up for workup if there is no prior history of such.   Increasing wall thickening along the esophagus. Please correlate for any symptoms of esophagitis. Slight increase in small pericardial effusion.   Aortic Atherosclerosis (ICD10-I70.0) and Emphysema (ICD10-J43.9).    ROS-see HPI  + = positive Constitutional:   No-   weight loss, night sweats, fevers, chills, fatigue, lassitude. HEENT:   No-  headaches, difficulty swallowing, tooth/dental problems, sore throat,       No-  sneezing, itching, ear  ache, nasal congestion, post nasal drip,  CV:  No-   chest pain, orthopnea, PND, swelling in lower extremities, anasarca, dizziness, palpitations,  Claudication R thigh Resp:   + shortness of breath with exertion or at rest.              No-   productive cough,  No non-productive cough,  No- coughing up of blood.              No-   change in color of mucus.   wheezing.   Skin: No-   rash or lesions. GI:  No-   heartburn, indigestion, abdominal pain, nausea, vomiting, GU:  MS:  No-   joint pain or swelling.   Neuro-     nothing unusual Psych:  No- change in mood or affect. No depression or anxiety.  No memory loss.  OBJ- Physical Exam    O2 3L pulse POC General- Alert, Oriented, Affect-appropriate, Distress- none acute., looks fit Skin- rash-none, lesions- none, excoriation- none Lymphadenopathy-  Head- atraumatic            Eyes- Gross vision intact, PERRLA, conjunctivae and secretions clear            Ears- Hearing, canals-normal            Nose-  no-Septal dev, mucus, polyps, erosion, perforation             Throat- Mallampati II , mucosa clear , drainage- none, tonsils- atrophic Neck- flexible , trachea midline, no stridor , thyroid  nl, carotid no bruit Chest - symmetrical excursion , unlabored           Heart/CV- RRR/rapid , no murmur , no gallop  , no rub, nl s1 s2                           - JVD none , edema- none, stasis changes- none, varices- none           Lung- +diminished, cough-none , dullness-none, rub- none,            Chest wall-  Abd-  Br/ Gen/ Rectal- Not done, not indicated Extrem- cyanosis- none,  atrophy- none, strength- nl  Neuro- grossly intact to observation

## 2024-01-28 ENCOUNTER — Ambulatory Visit (INDEPENDENT_AMBULATORY_CARE_PROVIDER_SITE_OTHER): Payer: Medicare Other | Admitting: Internal Medicine

## 2024-01-28 ENCOUNTER — Encounter: Payer: Self-pay | Admitting: Internal Medicine

## 2024-01-28 ENCOUNTER — Telehealth: Payer: Self-pay

## 2024-01-28 VITALS — BP 142/102 | HR 87 | Ht 74.0 in | Wt 210.0 lb

## 2024-01-28 DIAGNOSIS — D45 Polycythemia vera: Secondary | ICD-10-CM | POA: Diagnosis not present

## 2024-01-28 DIAGNOSIS — Z87891 Personal history of nicotine dependence: Secondary | ICD-10-CM | POA: Diagnosis not present

## 2024-01-28 DIAGNOSIS — I2510 Atherosclerotic heart disease of native coronary artery without angina pectoris: Secondary | ICD-10-CM | POA: Diagnosis not present

## 2024-01-28 DIAGNOSIS — J9611 Chronic respiratory failure with hypoxia: Secondary | ICD-10-CM

## 2024-01-28 DIAGNOSIS — Z955 Presence of coronary angioplasty implant and graft: Secondary | ICD-10-CM | POA: Diagnosis not present

## 2024-01-28 DIAGNOSIS — E119 Type 2 diabetes mellitus without complications: Secondary | ICD-10-CM

## 2024-01-28 DIAGNOSIS — I739 Peripheral vascular disease, unspecified: Secondary | ICD-10-CM

## 2024-01-28 DIAGNOSIS — J449 Chronic obstructive pulmonary disease, unspecified: Secondary | ICD-10-CM | POA: Diagnosis not present

## 2024-01-28 MED ORDER — ALBUTEROL SULFATE HFA 108 (90 BASE) MCG/ACT IN AERS: 2.0000 | INHALATION_SPRAY | Freq: Four times a day (QID) | RESPIRATORY_TRACT | 3 refills | Status: DC | PRN

## 2024-01-28 MED ORDER — STIOLTO RESPIMAT 2.5-2.5 MCG/ACT IN AERS
2.0000 | INHALATION_SPRAY | Freq: Every day | RESPIRATORY_TRACT | 4 refills | Status: DC
Start: 2024-01-28 — End: 2024-07-30

## 2024-01-28 NOTE — Assessment & Plan Note (Signed)
 Continues to need O2 for sleep and occasionally in day. Stable Plan- continue home O2 2L

## 2024-01-28 NOTE — Assessment & Plan Note (Signed)
 Stable without significant problems related to lung cancer therapy

## 2024-01-28 NOTE — Telephone Encounter (Signed)
 Copied from CRM [id number]. Topic: Appointments - Appointment Cancel/Reschedule >> Jan 27, 2024 11:56 AM Justina Oman C wrote: Patient/patient representative is calling to cancel or reschedule an appointment. Refer to attachments for appointment information.  Patient has appointment for 01/28/24 at 9:30 am, however cannot make it until after 10:30 am. Patient is looking for the same day but at a later time. Lost services and disconnected phone call. Left a message for patient to call back to reschedule. >> Jan 27, 2024  4:35 PM Ilene Malling wrote: Patient [phone] states was talking about a specialist about seeing if he could come in for an office visit with Dr. Linder Revere at 11:30 am 01/28/24 instead on 9:30 am. Patient's wife had dementia and cannot leave until after 10 am. The phone call got disconnected and is calling back. Per CAL, CMA will have check with Dr. Linder Revere and call patient back tomorrow. Patient verbalized understanding and will wait for the call. Please advise and call back.    Pts appt time has changed. Nfn

## 2024-01-28 NOTE — Patient Instructions (Signed)
 Glad to hear your therapy is going well- hang in there, and let us  know if we can help!  Refills scripts printed for Stiolto maintenance inhaler and albuterol  rescue inhaler

## 2024-01-30 ENCOUNTER — Inpatient Hospital Stay

## 2024-01-30 VITALS — BP 160/95 | HR 91 | Temp 98.2°F | Resp 16

## 2024-01-30 DIAGNOSIS — J439 Emphysema, unspecified: Secondary | ICD-10-CM | POA: Diagnosis not present

## 2024-01-30 DIAGNOSIS — I700 Atherosclerosis of aorta: Secondary | ICD-10-CM | POA: Diagnosis not present

## 2024-01-30 DIAGNOSIS — C3412 Malignant neoplasm of upper lobe, left bronchus or lung: Secondary | ICD-10-CM

## 2024-01-30 DIAGNOSIS — Z5111 Encounter for antineoplastic chemotherapy: Secondary | ICD-10-CM | POA: Diagnosis not present

## 2024-01-30 DIAGNOSIS — R590 Localized enlarged lymph nodes: Secondary | ICD-10-CM | POA: Diagnosis not present

## 2024-01-30 DIAGNOSIS — I2510 Atherosclerotic heart disease of native coronary artery without angina pectoris: Secondary | ICD-10-CM | POA: Diagnosis not present

## 2024-01-30 LAB — CBC WITH DIFFERENTIAL (CANCER CENTER ONLY)
Abs Immature Granulocytes: 0.03 10*3/uL (ref 0.00–0.07)
Basophils Absolute: 0.1 10*3/uL (ref 0.0–0.1)
Basophils Relative: 1 %
Eosinophils Absolute: 0.2 10*3/uL (ref 0.0–0.5)
Eosinophils Relative: 2 %
HCT: 43.7 % (ref 39.0–52.0)
Hemoglobin: 14.4 g/dL (ref 13.0–17.0)
Immature Granulocytes: 0 %
Lymphocytes Relative: 33 %
Lymphs Abs: 2.6 10*3/uL (ref 0.7–4.0)
MCH: 31.6 pg (ref 26.0–34.0)
MCHC: 33 g/dL (ref 30.0–36.0)
MCV: 96 fL (ref 80.0–100.0)
Monocytes Absolute: 1 10*3/uL (ref 0.1–1.0)
Monocytes Relative: 13 %
Neutro Abs: 4 10*3/uL (ref 1.7–7.7)
Neutrophils Relative %: 51 %
Platelet Count: 217 10*3/uL (ref 150–400)
RBC: 4.55 MIL/uL (ref 4.22–5.81)
RDW: 20.6 % — ABNORMAL HIGH (ref 11.5–15.5)
WBC Count: 7.9 10*3/uL (ref 4.0–10.5)
nRBC: 0 % (ref 0.0–0.2)

## 2024-01-30 LAB — TSH: TSH: 1.98 u[IU]/mL (ref 0.350–4.500)

## 2024-01-30 LAB — CMP (CANCER CENTER ONLY)
ALT: 8 U/L (ref 0–44)
AST: 14 U/L — ABNORMAL LOW (ref 15–41)
Albumin: 3.8 g/dL (ref 3.5–5.0)
Alkaline Phosphatase: 64 U/L (ref 38–126)
Anion gap: 6 (ref 5–15)
BUN: 15 mg/dL (ref 8–23)
CO2: 27 mmol/L (ref 22–32)
Calcium: 9.2 mg/dL (ref 8.9–10.3)
Chloride: 108 mmol/L (ref 98–111)
Creatinine: 1.27 mg/dL — ABNORMAL HIGH (ref 0.61–1.24)
GFR, Estimated: 55 mL/min — ABNORMAL LOW (ref >60)
Glucose, Bld: 98 mg/dL (ref 70–99)
Potassium: 3.8 mmol/L (ref 3.5–5.1)
Sodium: 141 mmol/L (ref 135–145)
Total Bilirubin: 0.5 mg/dL (ref 0.0–1.2)
Total Protein: 7.2 g/dL (ref 6.5–8.1)

## 2024-01-30 MED ORDER — SODIUM CHLORIDE 0.9 % IV SOLN: INTRAVENOUS | Status: DC

## 2024-01-30 MED ORDER — SODIUM CHLORIDE 0.9 % IV SOLN
1500.0000 mg | Freq: Once | INTRAVENOUS | Status: AC
  Administered 2024-01-30: 1500 mg via INTRAVENOUS
  Filled 2024-01-30: qty 30

## 2024-01-30 NOTE — Patient Instructions (Signed)
 [facility] - A DEPT OF [facility]  Discharge Instructions: Thank you for choosing [facility] to provide your oncology and hematology care.   If you have a lab appointment with the Cancer Center, please go directly to the Cancer Center and check in at the registration area.   Wear comfortable clothing and clothing appropriate for easy access to any Portacath or PICC line.   We strive to give you quality time with your provider. You may need to reschedule your appointment if you arrive late (15 or more minutes).  Arriving late affects you and other patients whose appointments are after yours.  Also, if you miss three or more appointments without notifying the office, you may be dismissed from the clinic at the provider's discretion.      For prescription refill requests, have your pharmacy contact our office and allow 72 hours for refills to be completed.    Today you received the following chemotherapy and/or immunotherapy agents Durvalumab      To help prevent nausea and vomiting after your treatment, we encourage you to take your nausea medication as directed.  BELOW ARE SYMPTOMS THAT SHOULD BE REPORTED IMMEDIATELY: *FEVER GREATER THAN 100.4 F (38 C) OR HIGHER *CHILLS OR SWEATING *NAUSEA AND VOMITING THAT IS NOT CONTROLLED WITH YOUR NAUSEA MEDICATION *UNUSUAL SHORTNESS OF BREATH *UNUSUAL BRUISING OR BLEEDING *URINARY PROBLEMS (pain or burning when urinating, or frequent urination) *BOWEL PROBLEMS (unusual diarrhea, constipation, pain near the anus) TENDERNESS IN MOUTH AND THROAT WITH OR WITHOUT PRESENCE OF ULCERS (sore throat, sores in mouth, or a toothache) UNUSUAL RASH, SWELLING OR PAIN  UNUSUAL VAGINAL DISCHARGE OR ITCHING   Items with * indicate a potential emergency and should be followed up as soon as possible or go to the Emergency Department if any problems should occur.  Please show the CHEMOTHERAPY ALERT CARD or IMMUNOTHERAPY  ALERT CARD at check-in to the Emergency Department and triage nurse.  Should you have questions after your visit or need to cancel or reschedule your appointment, please contact [facility] - A DEPT OF [facility]  Dept: [phone]  and follow the prompts.  Office hours are 8:00 a.m. to 4:30 p.m. Monday - Friday. Please note that voicemails left after 4:00 p.m. may not be returned until the following business day.  We are closed weekends and major holidays. You have access to a nurse at all times for urgent questions. Please call the main number to the clinic Dept: [phone] and follow the prompts.   For any non-urgent questions, you may also contact your provider using MyChart. We now offer e-Visits for anyone 50 and older to request care online for non-urgent symptoms. For details visit [email or web address].   Also download the MyChart app! Go to the app store, search "MyChart", open the app, select [place], and log in with your MyChart username and password.  Durvalumab Injection What is this medication? DURVALUMAB (dur VAL ue mab) treats some types of cancer. It works by helping your immune system slow or stop the spread of cancer cells. It is a monoclonal antibody. This medicine may be used for other purposes; ask your health care provider or pharmacist if you have questions. COMMON BRAND NAME(S): IMFINZI What should I tell my care team before I take this medication? They need to know if you have any of these conditions: Allogeneic stem cell transplant (uses someone else's stem cells) Autoimmune  diseases, such as Crohn disease, ulcerative colitis, lupus History of chest radiation Nervous system problems, such as Guillain-Barre syndrome, myasthenia gravis Organ transplant An unusual or allergic reaction to durvalumab, other medications, foods, dyes, or preservatives Pregnant or trying to get pregnant Breast-feeding How should I use this  medication? This medication is infused into a vein. It is given by your care team in a hospital or clinic setting. A special MedGuide will be given to you before each treatment. Be sure to read this information carefully each time. Talk to your care team about the use of this medication in children. Special care may be needed. Overdosage: If you think you have taken too much of this medicine contact a poison control center or emergency room at once. NOTE: This medicine is only for you. Do not share this medicine with others. What if I miss a dose? Keep appointments for follow-up doses. It is important not to miss your dose. Call your care team if you are unable to keep an appointment. What may interact with this medication? Interactions have not been studied. This list may not describe all possible interactions. Give your health care provider a list of all the medicines, herbs, non-prescription drugs, or dietary supplements you use. Also tell them if you smoke, drink alcohol, or use illegal drugs. Some items may interact with your medicine. What should I watch for while using this medication? Your condition will be monitored carefully while you are receiving this medication. You may need blood work while taking this medication. This medication may cause serious skin reactions. They can happen weeks to months after starting the medication. Contact your care team right away if you notice fevers or flu-like symptoms with a rash. The rash may be red or purple and then turn into blisters or peeling of the skin. You may also notice a red rash with swelling of the face, lips, or lymph nodes in your neck or under your arms. Tell your care team right away if you have any change in your eyesight. Talk to your care team if you may be pregnant. Serious birth defects can occur if you take this medication during pregnancy and for 3 months after the last dose. You will need a negative pregnancy test before starting  this medication. Contraception is recommended while taking this medication and for 3 months after the last dose. Your care team can help you find the option that works for you. Do not breastfeed while taking this medication and for 3 months after the last dose. What side effects may I notice from receiving this medication? Side effects that you should report to your care team as soon as possible: Allergic reactions--skin rash, itching, hives, swelling of the face, lips, tongue, or throat Dry cough, shortness of breath or trouble breathing Eye pain, redness, irritation, or discharge with blurry or decreased vision Heart muscle inflammation--unusual weakness or fatigue, shortness of breath, chest pain, fast or irregular heartbeat, dizziness, swelling of the ankles, feet, or hands Hormone gland problems--headache, sensitivity to light, unusual weakness or fatigue, dizziness, fast or irregular heartbeat, increased sensitivity to cold or heat, excessive sweating, constipation, hair loss, increased thirst or amount of urine, tremors or shaking, irritability Infusion reactions--chest pain, shortness of breath or trouble breathing, feeling faint or lightheaded Kidney injury (glomerulonephritis)--decrease in the amount of urine, red or dark brown urine, foamy or bubbly urine, swelling of the ankles, hands, or feet Liver injury--right upper belly pain, loss of appetite, nausea, light-colored stool, dark yellow or  brown urine, yellowing skin or eyes, unusual weakness or fatigue Pain, tingling, or numbness in the hands or feet, muscle weakness, change in vision, confusion or trouble speaking, loss of balance or coordination, trouble walking, seizures Rash, fever, and swollen lymph nodes Redness, blistering, peeling, or loosening of the skin, including inside the mouth Sudden or severe stomach pain, bloody diarrhea, fever, nausea, vomiting Side effects that usually do not require medical attention (report these  to your care team if they continue or are bothersome): Bone, joint, or muscle pain Diarrhea Fatigue Loss of appetite Nausea Skin rash This list may not describe all possible side effects. Call your doctor for medical advice about side effects. You may report side effects to FDA at 1-800-FDA-1088. Where should I keep my medication? This medication is given in a hospital or clinic. It will not be stored at home. NOTE: This sheet is a summary. It may not cover all possible information. If you have questions about this medicine, talk to your doctor, pharmacist, or health care provider.  2024 Elsevier/Gold Standard (2022-01-16 00:00:00)

## 2024-01-31 ENCOUNTER — Telehealth: Payer: Self-pay | Admitting: Medical Oncology

## 2024-01-31 LAB — T4: T4, Total: 5.9 ug/dL (ref 4.5–12.0)

## 2024-01-31 NOTE — Telephone Encounter (Signed)
 He is doing fine. "It was no problem. I am mowing the lawn".

## 2024-01-31 NOTE — Telephone Encounter (Signed)
 He can only come to appts after 1100 due to  his wifes caregiver comes at 1000. Schedule message sent.

## 2024-01-31 NOTE — Telephone Encounter (Signed)
 I LVM to return my call to follow-up on his tolerance of immunotherapy given yesterday .

## 2024-02-11 ENCOUNTER — Telehealth: Payer: Self-pay | Admitting: Physician Assistant

## 2024-02-11 NOTE — Telephone Encounter (Signed)
 Left the patient a voicemail with appointment changes due to provider on PAL. Informed the patient of decoupled appointments and will be mailed a reminder.

## 2024-02-20 NOTE — Progress Notes (Signed)
 [facility] OFFICE PROGRESS NOTE  Jonathan Hollow, MD [address] [place] [address]  DIAGNOSIS:  Stage IIIa (T3, N2, M0) non-small cell lung cancer, squamous cell carcinoma presented with left upper lobe lung mass abutting the pleura and ipsilateral mediastinal and left hilar lymph nodes. The left adrenal nodule is mildly FDG avid and indeterminate. Diagnosed in January 2025.   PRIOR THERAPY: Concurrent chemoradiation with weekly carboplatin  for AUC of 2 and paclitaxel  45 Mg/M2.  Status post 7 cycles.  Last dose was given 12/23/2023.   CURRENT THERAPY: Consolidation immunotherapy with Imfinzi  1500 Mg IV every 4 weeks.  First dose 01/28/2024. Status post 1 cycle.   INTERVAL HISTORY: Jonathan [name]. 88 y.o. male returns to the clinic today for a follow-up visit.  The patient is currently undergoing consolidation immunotherapy. He is status post his first cycle and tolerated it well. He is feeling well except for intermittent dry cough. He had a cough with chemo/radiation but he states it is a little worse. He denies signs or symptoms of infection at this time. He denies any fever, chills, or night sweats. He denies changes with shortness of breath. He states he is active and mows his lawn.    He does have supplemental oxygen  at home that he may need to wear during the day as needed and at night. He denies any hemoptysis or chest pain.   Denies any nausea, vomiting, diarrhea, or constipation.  Denies any headache or visual changes. He has some rash on his back from where he had radiation. He has lotion. He is here today for evaluation and repeat blood work before undergoing cycle #2   MEDICAL HISTORY: Past Medical History:  Diagnosis Date   CAD (coronary artery disease)    Two RCA stents remotely / 3rd RCA stent 2006   Colon polyps    s/p several Cscopes.   COPD (chronic obstructive pulmonary disease) (HCC)    on O2, nocturnal   Diabetes mellitus    dx aprox  2009   ED (erectile dysfunction)    has a vacumm device   Ejection fraction    Hyperlipidemia    dx in 90s   Hypertension    dx in the 37   Hypogonadism male    PVD (peripheral vascular disease) (HCC)    s/p stents at LE 2009, Dr Berry [place]   Shortness of breath    O2 Sat dropped to 82% walking on the treadmill, September, 2012    ALLERGIES:  has no known allergies.  MEDICATIONS:  Current Outpatient Medications  Medication Sig Dispense Refill   benzonatate (TESSALON) 100 MG capsule Take 1 capsule (100 mg total) by mouth 3 (three) times daily as needed. 30 capsule 2   albuterol  (VENTOLIN  HFA) 108 (90 Base) MCG/ACT inhaler Inhale 2 puffs into the lungs every 6 (six) hours as needed for wheezing or shortness of breath. 54 g 3   Blood Glucose Monitoring Suppl (FREESTYLE FREEDOM LITE) w/Device KIT Use Freestyle Freedom Lite meter to check blood sugar twice daily. DX:E11.65 1 kit 0   clopidogrel  (PLAVIX ) 75 MG tablet Take 1 tablet (75 mg total) by mouth daily with breakfast. 90 tablet 1   Dulaglutide  (TRULICITY ) 3 MG/0.5ML SOAJ INJECT 3 MG UNDER THE SKIN ONCE A WEEK 6 mL 3   empagliflozin  (JARDIANCE ) 25 MG TABS tablet Take 1 tablet (25 mg total) by mouth daily before breakfast. For diabetes E11.65 and E 11.29 90 tablet 3  furosemide  (LASIX ) 40 MG tablet Take 1 tablet (40 mg total) by mouth daily. 90 tablet 1   gabapentin  (NEURONTIN ) 100 MG capsule Take 100 mg by mouth 2 (two) times daily.     glucose blood (FREESTYLE LITE) test strip USE AS INSTRUCTED 100 strip 11   hydrOXYzine  (ATARAX ) 25 MG tablet Take 1 tablet by mouth at bedtime as needed.     insulin  glargine (LANTUS  SOLOSTAR) 100 UNIT/ML Solostar Pen Inject 15 Units into the skin daily. 15 mL 11   Insulin  Pen Needle 32G X 4 MM MISC Use 1 x a day 100 each 3   metFORMIN  (GLUCOPHAGE ) 500 MG tablet Take 1 tablet (500 mg total) by mouth 2 (two) times daily with a meal. 180 tablet 3   metoprolol  (TOPROL -XL) 200 MG 24 hr tablet Take 100 mg by  mouth daily.     Multiple Vitamin (MULTIVITAMIN WITH MINERALS) TABS tablet Take 1 tablet by mouth daily.     OXYGEN  Inhale 2 L/min into the lungs at bedtime. PRN during day     sacubitril -valsartan  (ENTRESTO ) 24-26 MG Take 1 tablet by mouth 2 (two) times daily. 60 tablet 0   simvastatin  (ZOCOR ) 20 MG tablet Take 20 mg by mouth daily.     sucralfate  (CARAFATE ) 1 g tablet Take 1 tablet (1 g total) by mouth 4 (four) times daily -  with meals and at bedtime. 5 min before meals for radiation induced esophagitis 120 tablet 2   tamsulosin  (FLOMAX ) 0.4 MG CAPS capsule Take 1 capsule (0.4 mg total) by mouth daily after supper. 90 capsule 1   Tiotropium Bromide -Olodaterol (STIOLTO RESPIMAT ) 2.5-2.5 MCG/ACT AERS Inhale 2 puffs into the lungs daily. 12 g 4   No current facility-administered medications for this visit.    SURGICAL HISTORY:  Past Surgical History:  Procedure Laterality Date   APPENDECTOMY     BRONCHIAL BIOPSY  10/01/2023   Procedure: BRONCHIAL BIOPSIES;  Surgeon: Prudy Brownie, DO;  Location: [facility];  Service: Pulmonary;;   BRONCHIAL BRUSHINGS  10/01/2023   Procedure: BRONCHIAL BRUSHINGS;  Surgeon: Prudy Brownie, DO;  Location: [facility];  Service: Pulmonary;;   BRONCHIAL WASHINGS  10/01/2023   Procedure: BRONCHIAL WASHINGS;  Surgeon: Prudy Brownie, DO;  Location: [facility];  Service: Pulmonary;;   CORONARY STENT INTERVENTION N/A 01/12/2022   Procedure: CORONARY STENT INTERVENTION;  Surgeon: Swaziland, Peter M, MD;  Location: [facility]AS[facility] CV LAB;  Service: Cardiovascular;  Laterality: N/A;   HEMOSTASIS CONTROL  10/01/2023   Procedure: HEMOSTASIS CONTROL;  Surgeon: Prudy Brownie, DO;  Location: [facility];  Service: Pulmonary;;   LEFT HEART CATH AND CORONARY ANGIOGRAPHY N/A 03/13/2023   Procedure: LEFT HEART CATH AND CORONARY ANGIOGRAPHY;  Surgeon: Swaziland, Peter M, MD;  [facility] INVASIVE CV LAB;  Service: Cardiovascular;  Laterality: N/A;   RIGHT/LEFT HEART CATH AND  CORONARY ANGIOGRAPHY N/A 01/11/2022   Procedure: RIGHT/LEFT HEART CATH AND CORONARY ANGIOGRAPHY;  Surgeon: Arty Binning, MD;  Location: [facility];  Service: Cardiovascular;  Laterality: N/A;   TONSILLECTOMY     VIDEO BRONCHOSCOPY  10/01/2023   Procedure: VIDEO BRONCHOSCOPY WITHOUT FLUORO;  Surgeon: Prudy Brownie, DO;  Location: [facility];  Service: Pulmonary;;    REVIEW OF SYSTEMS:   Review of Systems  Constitutional: Negative for appetite change, chills, fatigue, fever and unexpected weight change.  HENT: Negative for mouth sores, nosebleeds, sore throat and trouble swallowing.   Eyes: Negative for eye problems and icterus.  Respiratory: Stable  dyspnea on exertion and dry cough. Negative for hemoptysis and wheezing.   Cardiovascular: Negative for chest pain and leg swelling.  Gastrointestinal: Negative for abdominal pain, constipation, diarrhea, nausea and vomiting.  Genitourinary: Negative for bladder incontinence, difficulty urinating, dysuria, frequency and hematuria.   Musculoskeletal: Negative for back pain, gait problem, neck pain and neck stiffness.  Skin: Mild itching on back near radiation. Negative for itching and rash.  Neurological: Negative for dizziness, extremity weakness, gait problem, headaches, light-headedness and seizures.  Hematological: Negative for adenopathy. Does not bruise/bleed easily.  Psychiatric/Behavioral: Negative for confusion, depression and sleep disturbance. The patient is not nervous/anxious.     PHYSICAL EXAMINATION:  Blood pressure (!) 146/72, pulse 95, temperature 98.1 F (36.7 C), resp. rate 18, weight 211 lb 3.2 oz (95.8 kg), SpO2 91%.  ECOG PERFORMANCE STATUS: 1  Physical Exam  Constitutional: Oriented to person, place, and time and well-developed, well-nourished, and in no distress.  HENT:  Head: Normocephalic and atraumatic.  Mouth/Throat: Oropharynx is clear and moist. No oropharyngeal exudate.  Eyes: Conjunctivae are  normal. Right eye exhibits no discharge. Left eye exhibits no discharge. No scleral icterus.  Neck: Normal range of motion. Neck supple.  Cardiovascular: Normal rate, regular rhythm, normal heart sounds and intact distal pulses.   Pulmonary/Chest: Effort normal and breath sounds normal. No respiratory distress. No wheezes. No rales.  Abdominal: Soft. Bowel sounds are normal. Exhibits no distension and no mass. There is no tenderness.  Musculoskeletal: Normal range of motion. Exhibits no edema.  Lymphadenopathy:    No cervical adenopathy.  Neurological: Alert and oriented to person, place, and time. Exhibits normal muscle tone. Gait normal. Coordination normal.  Skin: Skin is warm and dry. No rash noted. Not diaphoretic. No erythema. No pallor.  Psychiatric: Mood, memory and judgment normal.  Vitals reviewed.  LABORATORY DATA: Lab Results  Component Value Date   WBC 6.4 02/26/2024   HGB 14.7 02/26/2024   HCT 44.4 02/26/2024   MCV 98.0 02/26/2024   PLT 179 02/26/2024      Chemistry      Component Value Date/Time   NA 142 02/26/2024 1311   NA 144 01/01/2024 0000   K 4.2 02/26/2024 1311   CL 107 02/26/2024 1311   CO2 28 02/26/2024 1311   BUN 16 02/26/2024 1311   BUN 14 01/01/2024 0000   CREATININE 1.25 (H) 02/26/2024 1311   CREATININE 1.04 11/28/2023 0803   GLU 87 01/01/2024 0000      Component Value Date/Time   CALCIUM 9.5 02/26/2024 1311   ALKPHOS 61 02/26/2024 1311   AST 14 (L) 02/26/2024 1311   ALT 9 02/26/2024 1311   BILITOT 0.6 02/26/2024 1311       RADIOGRAPHIC STUDIES:  No results found.   ASSESSMENT/PLAN:  This is a very pleasant 88 year old African-American male recently diagnosed with Stage IIIa (T3, N2, M0) non-small cell lung cancer, squamous cell carcinoma presented with left upper lobe lung mass abutting the pleura and ipsilateral mediastinal and left hilar lymph nodes. The left adrenal nodule is mildly FDG avid and indeterminate. Diagnosed in January  2025.    The patient completed a course of concurrent chemoradiation with weekly carboplatin  for AUC of 2 and paclitaxel  45 Mg/M2 status post 7 cycles.  Last dose was given 12/23/2023.   He is currently undergoing a consolidation immunotherapy with Imfinzi  1500 mg IV every 4 weeks.   Labs were reviewed. Discussed the cough with Dr. Marguerita Shih. The patient denies any worsening dyspnea on  exertion. He is well appearing today. The cough is intermittent. We will perform CXR today. He will proceed with treatment as scheduled tomorrow. I will send tessalon to the pharmacy for cough.   Recommend that proceed with cycle # 2 tomorrow as scheduled.   We will see him back for labs and follow up in 4 weeks before undergoing cycle #3.   The patient was advised to call immediately if he has any concerning symptoms in the interval. The patient voices understanding of current disease status and treatment options and is in agreement with the current care plan. All questions were answered. The patient knows to call the clinic with any problems, questions or concerns. We can certainly see the patient much sooner if necessary    Orders Placed This Encounter  Procedures   DG Chest 2 View    Standing Status:   Future    Expected Date:   02/26/2024    Expiration Date:   02/25/2025    Reason for Exam (SYMPTOM  OR DIAGNOSIS REQUIRED):   On immunotherapy has increased cough. Assess for infection, pneumonitis, etc.    Preferred imaging location?:   [facility]     The total time spent in the appointment was 20-29 minutes  Rian Koon L Yalexa Blust, PA-C 02/26/24

## 2024-02-26 ENCOUNTER — Inpatient Hospital Stay (HOSPITAL_BASED_OUTPATIENT_CLINIC_OR_DEPARTMENT_OTHER): Admitting: Physician Assistant

## 2024-02-26 ENCOUNTER — Telehealth: Payer: Self-pay | Admitting: Physician Assistant

## 2024-02-26 ENCOUNTER — Ambulatory Visit (HOSPITAL_COMMUNITY)
Admission: RE | Admit: 2024-02-26 | Discharge: 2024-02-26 | Disposition: A | Discharge status: Home / Self Care | Source: Ambulatory Visit | Attending: Physician Assistant | Admitting: Physician Assistant

## 2024-02-26 ENCOUNTER — Inpatient Hospital Stay: Attending: Internal Medicine

## 2024-02-26 ENCOUNTER — Encounter (HOSPITAL_COMMUNITY): Payer: Self-pay | Admitting: Physician Assistant

## 2024-02-26 VITALS — BP 146/72 | HR 95 | Temp 98.1°F | Resp 18 | Wt 211.2 lb

## 2024-02-26 DIAGNOSIS — Z5112 Encounter for antineoplastic immunotherapy: Secondary | ICD-10-CM | POA: Insufficient documentation

## 2024-02-26 DIAGNOSIS — Z7985 Long-term (current) use of injectable non-insulin antidiabetic drugs: Secondary | ICD-10-CM | POA: Insufficient documentation

## 2024-02-26 DIAGNOSIS — J439 Emphysema, unspecified: Secondary | ICD-10-CM | POA: Diagnosis not present

## 2024-02-26 DIAGNOSIS — Z7902 Long term (current) use of antithrombotics/antiplatelets: Secondary | ICD-10-CM | POA: Insufficient documentation

## 2024-02-26 DIAGNOSIS — E278 Other specified disorders of adrenal gland: Secondary | ICD-10-CM | POA: Diagnosis not present

## 2024-02-26 DIAGNOSIS — Z85118 Personal history of other malignant neoplasm of bronchus and lung: Secondary | ICD-10-CM | POA: Diagnosis not present

## 2024-02-26 DIAGNOSIS — E785 Hyperlipidemia, unspecified: Secondary | ICD-10-CM | POA: Diagnosis not present

## 2024-02-26 DIAGNOSIS — Z923 Personal history of irradiation: Secondary | ICD-10-CM | POA: Insufficient documentation

## 2024-02-26 DIAGNOSIS — Z79899 Other long term (current) drug therapy: Secondary | ICD-10-CM | POA: Insufficient documentation

## 2024-02-26 DIAGNOSIS — R052 Subacute cough: Secondary | ICD-10-CM | POA: Diagnosis not present

## 2024-02-26 DIAGNOSIS — R051 Acute cough: Secondary | ICD-10-CM

## 2024-02-26 DIAGNOSIS — R059 Cough, unspecified: Secondary | ICD-10-CM | POA: Insufficient documentation

## 2024-02-26 DIAGNOSIS — Z9221 Personal history of antineoplastic chemotherapy: Secondary | ICD-10-CM | POA: Insufficient documentation

## 2024-02-26 DIAGNOSIS — R918 Other nonspecific abnormal finding of lung field: Secondary | ICD-10-CM | POA: Diagnosis not present

## 2024-02-26 DIAGNOSIS — Z794 Long term (current) use of insulin: Secondary | ICD-10-CM | POA: Diagnosis not present

## 2024-02-26 DIAGNOSIS — I2510 Atherosclerotic heart disease of native coronary artery without angina pectoris: Secondary | ICD-10-CM | POA: Insufficient documentation

## 2024-02-26 DIAGNOSIS — Z9981 Dependence on supplemental oxygen: Secondary | ICD-10-CM | POA: Insufficient documentation

## 2024-02-26 DIAGNOSIS — C3412 Malignant neoplasm of upper lobe, left bronchus or lung: Secondary | ICD-10-CM | POA: Diagnosis not present

## 2024-02-26 DIAGNOSIS — E1151 Type 2 diabetes mellitus with diabetic peripheral angiopathy without gangrene: Secondary | ICD-10-CM | POA: Diagnosis not present

## 2024-02-26 DIAGNOSIS — I10 Essential (primary) hypertension: Secondary | ICD-10-CM | POA: Insufficient documentation

## 2024-02-26 DIAGNOSIS — Z860100 Personal history of colon polyps, unspecified: Secondary | ICD-10-CM | POA: Diagnosis not present

## 2024-02-26 DIAGNOSIS — R21 Rash and other nonspecific skin eruption: Secondary | ICD-10-CM | POA: Insufficient documentation

## 2024-02-26 DIAGNOSIS — C778 Secondary and unspecified malignant neoplasm of lymph nodes of multiple regions: Secondary | ICD-10-CM | POA: Insufficient documentation

## 2024-02-26 LAB — CBC WITH DIFFERENTIAL (CANCER CENTER ONLY)
Abs Immature Granulocytes: 0.02 10*3/uL (ref 0.00–0.07)
Basophils Absolute: 0.1 10*3/uL (ref 0.0–0.1)
Basophils Relative: 1 %
Eosinophils Absolute: 0.3 10*3/uL (ref 0.0–0.5)
Eosinophils Relative: 4 %
HCT: 44.4 % (ref 39.0–52.0)
Hemoglobin: 14.7 g/dL (ref 13.0–17.0)
Immature Granulocytes: 0 %
Lymphocytes Relative: 28 %
Lymphs Abs: 1.8 10*3/uL (ref 0.7–4.0)
MCH: 32.5 pg (ref 26.0–34.0)
MCHC: 33.1 g/dL (ref 30.0–36.0)
MCV: 98 fL (ref 80.0–100.0)
Monocytes Absolute: 0.7 10*3/uL (ref 0.1–1.0)
Monocytes Relative: 11 %
Neutro Abs: 3.6 10*3/uL (ref 1.7–7.7)
Neutrophils Relative %: 56 %
Platelet Count: 179 10*3/uL (ref 150–400)
RBC: 4.53 MIL/uL (ref 4.22–5.81)
RDW: 17.2 % — ABNORMAL HIGH (ref 11.5–15.5)
WBC Count: 6.4 10*3/uL (ref 4.0–10.5)
nRBC: 0 % (ref 0.0–0.2)

## 2024-02-26 LAB — CMP (CANCER CENTER ONLY)
ALT: 9 U/L (ref 0–44)
AST: 14 U/L — ABNORMAL LOW (ref 15–41)
Albumin: 3.8 g/dL (ref 3.5–5.0)
Alkaline Phosphatase: 61 U/L (ref 38–126)
Anion gap: 7 (ref 5–15)
BUN: 16 mg/dL (ref 8–23)
CO2: 28 mmol/L (ref 22–32)
Calcium: 9.5 mg/dL (ref 8.9–10.3)
Chloride: 107 mmol/L (ref 98–111)
Creatinine: 1.25 mg/dL — ABNORMAL HIGH (ref 0.61–1.24)
GFR, Estimated: 56 mL/min — ABNORMAL LOW (ref >60)
Glucose, Bld: 203 mg/dL — ABNORMAL HIGH (ref 70–99)
Potassium: 4.2 mmol/L (ref 3.5–5.1)
Sodium: 142 mmol/L (ref 135–145)
Total Bilirubin: 0.6 mg/dL (ref 0.0–1.2)
Total Protein: 7.2 g/dL (ref 6.5–8.1)

## 2024-02-26 MED ORDER — BENZONATATE 100 MG PO CAPS: 100.0000 mg | ORAL_CAPSULE | Freq: Three times a day (TID) | ORAL | 2 refills | Status: DC | PRN

## 2024-02-26 NOTE — Telephone Encounter (Signed)
 I called the patient to review his x-ray results which did not show any active cardiopulmonary disease.  He will come in for his treatment tomorrow as planned.  I have sent Tessalon to his pharmacy to help with his cough.  Of course I did let the patient know should he develop any worsening symptoms such as shortness of breath, increased cough, fevers, etc. he can contact us  for reassessment.  He was appreciative of the call.

## 2024-02-27 ENCOUNTER — Other Ambulatory Visit

## 2024-02-27 ENCOUNTER — Inpatient Hospital Stay

## 2024-02-27 ENCOUNTER — Ambulatory Visit: Admitting: Internal Medicine

## 2024-02-27 ENCOUNTER — Ambulatory Visit

## 2024-02-27 VITALS — BP 150/86 | HR 75 | Temp 97.4°F | Resp 18

## 2024-02-27 DIAGNOSIS — C3412 Malignant neoplasm of upper lobe, left bronchus or lung: Secondary | ICD-10-CM | POA: Diagnosis not present

## 2024-02-27 DIAGNOSIS — I2510 Atherosclerotic heart disease of native coronary artery without angina pectoris: Secondary | ICD-10-CM | POA: Diagnosis not present

## 2024-02-27 DIAGNOSIS — E278 Other specified disorders of adrenal gland: Secondary | ICD-10-CM | POA: Diagnosis not present

## 2024-02-27 DIAGNOSIS — Z5112 Encounter for antineoplastic immunotherapy: Secondary | ICD-10-CM | POA: Diagnosis not present

## 2024-02-27 DIAGNOSIS — R21 Rash and other nonspecific skin eruption: Secondary | ICD-10-CM | POA: Diagnosis not present

## 2024-02-27 DIAGNOSIS — C778 Secondary and unspecified malignant neoplasm of lymph nodes of multiple regions: Secondary | ICD-10-CM | POA: Diagnosis not present

## 2024-02-27 MED ORDER — SODIUM CHLORIDE 0.9 % IV SOLN
1500.0000 mg | Freq: Once | INTRAVENOUS | Status: AC
  Administered 2024-02-27: 1500 mg via INTRAVENOUS
  Filled 2024-02-27: qty 30

## 2024-02-27 MED ORDER — SODIUM CHLORIDE 0.9 % IV SOLN: INTRAVENOUS | Status: DC

## 2024-02-27 NOTE — Patient Instructions (Signed)
 [facility]  Discharge Instructions: Thank you for choosing [facility] to provide your oncology and hematology care.   If you have a lab appointment with the Cancer Center, please go directly to the Cancer Center and check in at the registration area.   Wear comfortable clothing and clothing appropriate for easy access to any Portacath or PICC line.   We strive to give you quality time with your provider. You may need to reschedule your appointment if you arrive late (15 or more minutes).  Arriving late affects you and other patients whose appointments are after yours.  Also, if you miss three or more appointments without notifying the office, you may be dismissed from the clinic at the provider's discretion.      For prescription refill requests, have your pharmacy contact our office and allow 72 hours for refills to be completed.    Today you received the following chemotherapy and/or immunotherapy agents: imfinzi   To help prevent nausea and vomiting after your treatment, we encourage you to take your nausea medication as directed.  BELOW ARE SYMPTOMS THAT SHOULD BE REPORTED IMMEDIATELY: *FEVER GREATER THAN 100.4 F (38 C) OR HIGHER *CHILLS OR SWEATING *NAUSEA AND VOMITING THAT IS NOT CONTROLLED WITH YOUR NAUSEA MEDICATION *UNUSUAL SHORTNESS OF BREATH *UNUSUAL BRUISING OR BLEEDING *URINARY PROBLEMS (pain or burning when urinating, or frequent urination) *BOWEL PROBLEMS (unusual diarrhea, constipation, pain near the anus) TENDERNESS IN MOUTH AND THROAT WITH OR WITHOUT PRESENCE OF ULCERS (sore throat, sores in mouth, or a toothache) UNUSUAL RASH, SWELLING OR PAIN  UNUSUAL VAGINAL DISCHARGE OR ITCHING   Items with * indicate a potential emergency and should be followed up as soon as possible or go to the Emergency Department if any problems should occur.  Please show the CHEMOTHERAPY ALERT CARD or IMMUNOTHERAPY ALERT  CARD at check-in to the Emergency Department and triage nurse.  Should you have questions after your visit or need to cancel or reschedule your appointment, please contact [facility]  Dept: [phone]  and follow the prompts.  Office hours are 8:00 a.m. to 4:30 p.m. Monday - Friday. Please note that voicemails left after 4:00 p.m. may not be returned until the following business day.  We are closed weekends and major holidays. You have access to a nurse at all times for urgent questions. Please call the main number to the clinic Dept: [phone] and follow the prompts.   For any non-urgent questions, you may also contact your provider using MyChart. We now offer e-Visits for anyone 65 and older to request care online for non-urgent symptoms. For details visit [email or web address].   Also download the MyChart app! Go to the app store, search "MyChart", open the app, select [place], and log in with your MyChart username and password.

## 2024-03-02 ENCOUNTER — Telehealth: Payer: Self-pay

## 2024-03-02 NOTE — Telephone Encounter (Signed)
 Spoke with Cassie, PA in regards to patient's message. Cassie recommends the patient follow up with his PCP regarding urinary frequency, review current medications including Lasix , and ensure that his diabetes is well-controlled. She also advised that the patient should monitor his blood sugars closely at home. Our office can place a referral to Nutrition for assistance with eating habits if needed.  Left voicemail with these recommendations and requested a return call.

## 2024-03-02 NOTE — Telephone Encounter (Signed)
 Pt called back and I returned his call and had to leave a message. I gave him the information in Ashley's message.

## 2024-03-02 NOTE — Telephone Encounter (Signed)
 Spoke with patient this morning. He reports, I'm losing weight and urinating about every hour. Patient states he does not eat like he should due to a lack of appetite but reports drinking plenty of fluids.  He clarified that he is not drinking any more fluids than normal.  Patient states his only symptom is urinary frequency. He denies urinary burning, pain with urination, lower abdominal cramps, and fever.  Informed patient that I would relay this information to Dr. Marguerita Shih and [place], PA, and follow up with a call once I receive further guidance.

## 2024-03-11 ENCOUNTER — Ambulatory Visit: Admitting: Endocrinology

## 2024-03-24 ENCOUNTER — Telehealth: Payer: Self-pay | Admitting: Nurse Practitioner

## 2024-03-24 ENCOUNTER — Ambulatory Visit

## 2024-03-24 ENCOUNTER — Ambulatory Visit: Admitting: Internal Medicine

## 2024-03-24 ENCOUNTER — Other Ambulatory Visit

## 2024-03-24 NOTE — Telephone Encounter (Signed)
 Rescheduled appointments with the patient per provider out of office.

## 2024-03-25 ENCOUNTER — Ambulatory Visit: Admitting: Physician Assistant

## 2024-03-25 ENCOUNTER — Ambulatory Visit

## 2024-03-25 ENCOUNTER — Other Ambulatory Visit

## 2024-03-25 DIAGNOSIS — N401 Enlarged prostate with lower urinary tract symptoms: Secondary | ICD-10-CM | POA: Diagnosis not present

## 2024-03-25 DIAGNOSIS — R351 Nocturia: Secondary | ICD-10-CM | POA: Diagnosis not present

## 2024-03-25 DIAGNOSIS — N5201 Erectile dysfunction due to arterial insufficiency: Secondary | ICD-10-CM | POA: Diagnosis not present

## 2024-03-26 ENCOUNTER — Inpatient Hospital Stay

## 2024-03-26 ENCOUNTER — Inpatient Hospital Stay: Admitting: Internal Medicine

## 2024-03-27 ENCOUNTER — Inpatient Hospital Stay: Attending: Internal Medicine

## 2024-03-27 ENCOUNTER — Encounter: Payer: Self-pay | Admitting: Nurse Practitioner

## 2024-03-27 ENCOUNTER — Inpatient Hospital Stay

## 2024-03-27 ENCOUNTER — Inpatient Hospital Stay (HOSPITAL_BASED_OUTPATIENT_CLINIC_OR_DEPARTMENT_OTHER): Admitting: Nurse Practitioner

## 2024-03-27 VITALS — BP 134/82 | HR 82 | Temp 98.1°F | Resp 17 | Wt 215.1 lb

## 2024-03-27 DIAGNOSIS — Z7985 Long-term (current) use of injectable non-insulin antidiabetic drugs: Secondary | ICD-10-CM | POA: Insufficient documentation

## 2024-03-27 DIAGNOSIS — Z5112 Encounter for antineoplastic immunotherapy: Secondary | ICD-10-CM | POA: Diagnosis not present

## 2024-03-27 DIAGNOSIS — Z7902 Long term (current) use of antithrombotics/antiplatelets: Secondary | ICD-10-CM | POA: Insufficient documentation

## 2024-03-27 DIAGNOSIS — C3412 Malignant neoplasm of upper lobe, left bronchus or lung: Secondary | ICD-10-CM

## 2024-03-27 DIAGNOSIS — Z79899 Other long term (current) drug therapy: Secondary | ICD-10-CM | POA: Diagnosis not present

## 2024-03-27 DIAGNOSIS — Z923 Personal history of irradiation: Secondary | ICD-10-CM | POA: Diagnosis not present

## 2024-03-27 DIAGNOSIS — Z7984 Long term (current) use of oral hypoglycemic drugs: Secondary | ICD-10-CM | POA: Diagnosis not present

## 2024-03-27 LAB — CMP (CANCER CENTER ONLY)
ALT: 11 U/L (ref 0–44)
AST: 14 U/L — ABNORMAL LOW (ref 15–41)
Albumin: 3.7 g/dL (ref 3.5–5.0)
Alkaline Phosphatase: 58 U/L (ref 38–126)
Anion gap: 6 (ref 5–15)
BUN: 16 mg/dL (ref 8–23)
CO2: 27 mmol/L (ref 22–32)
Calcium: 9.4 mg/dL (ref 8.9–10.3)
Chloride: 108 mmol/L (ref 98–111)
Creatinine: 1.36 mg/dL — ABNORMAL HIGH (ref 0.61–1.24)
GFR, Estimated: 51 mL/min — ABNORMAL LOW (ref >60)
Glucose, Bld: 152 mg/dL — ABNORMAL HIGH (ref 70–99)
Potassium: 4.5 mmol/L (ref 3.5–5.1)
Sodium: 141 mmol/L (ref 135–145)
Total Bilirubin: 0.5 mg/dL (ref 0.0–1.2)
Total Protein: 6.9 g/dL (ref 6.5–8.1)

## 2024-03-27 LAB — CBC WITH DIFFERENTIAL (CANCER CENTER ONLY)
Abs Immature Granulocytes: 0.01 K/uL (ref 0.00–0.07)
Basophils Absolute: 0 K/uL (ref 0.0–0.1)
Basophils Relative: 1 %
Eosinophils Absolute: 0.1 K/uL (ref 0.0–0.5)
Eosinophils Relative: 2 %
HCT: 43.5 % (ref 39.0–52.0)
Hemoglobin: 14.7 g/dL (ref 13.0–17.0)
Immature Granulocytes: 0 %
Lymphocytes Relative: 26 %
Lymphs Abs: 1.4 K/uL (ref 0.7–4.0)
MCH: 33.3 pg (ref 26.0–34.0)
MCHC: 33.8 g/dL (ref 30.0–36.0)
MCV: 98.4 fL (ref 80.0–100.0)
Monocytes Absolute: 0.7 K/uL (ref 0.1–1.0)
Monocytes Relative: 13 %
Neutro Abs: 3.1 K/uL (ref 1.7–7.7)
Neutrophils Relative %: 58 %
Platelet Count: 151 K/uL (ref 150–400)
RBC: 4.42 MIL/uL (ref 4.22–5.81)
RDW: 14.5 % (ref 11.5–15.5)
WBC Count: 5.3 K/uL (ref 4.0–10.5)
nRBC: 0 % (ref 0.0–0.2)

## 2024-03-27 LAB — TSH: TSH: 1.45 u[IU]/mL (ref 0.350–4.500)

## 2024-03-27 MED ORDER — SODIUM CHLORIDE 0.9 % IV SOLN
1500.0000 mg | Freq: Once | INTRAVENOUS | Status: AC
  Administered 2024-03-27: 1500 mg via INTRAVENOUS
  Filled 2024-03-27: qty 30

## 2024-03-27 MED ORDER — SODIUM CHLORIDE 0.9 % IV SOLN: INTRAVENOUS | Status: DC

## 2024-03-27 NOTE — Progress Notes (Signed)
 Patient Care Team: [name], MD as PCP - [address] Dorn PARAS, MD as PCP - Cardiology (Cardiology) Neysa Reggy BIRCH, MD as Consulting Physician (Pulmonary Disease) Octavia Charleston, MD as Consulting Physician (Ophthalmology) Minus Jaylene KIDD, DPM (Inactive) as Consulting Physician (Podiatry) Maranda Leim DEL, MD as Consulting Physician (Cardiology) Chales Idol, MD as Consulting Physician (Urology) Kristie Lamprey, MD as Consulting Physician (Gastroenterology) Lbcardiology, Rande, MD as Rounding Team (Cardiology) Duke, Jon Garre, [place] as Physician Assistant (Cardiology)  Clinic Day:  03/27/2024  Referring physician: Sherrod Sherrod, MD  ASSESSMENT & PLAN:   Assessment & Plan: Squamous cell carcinoma of upper lobe of left lung (HCC)  Stage IIIa (T3, N2, M0) non-small cell lung cancer, squamous cell carcinoma presented with left upper lobe lung mass abutting the pleura and ipsilateral mediastinal and left hilar lymph nodes. The left adrenal nodule is mildly FDG avid and indeterminate. Diagnosed in January 2025.   The patient completed a course of concurrent chemoradiation with weekly carboplatin  for AUC of 2 and paclitaxel  45 Mg/M2 status post 7 cycles.  Last dose was given 12/23/2023.   He is currently undergoing a consolidation immunotherapy with Imfinzi  1500 mg IV every 4 weeks.  -03/27/2024 - he presents for Cycle 3 Day 1 IV Imfinzi . He is tolerating this well.    Plan: Labs reviewed. -CBC is within normal limits. -CMP showing mildly elevated serum creatinine with GFR 51 (generally stable). - Thyroid  panel pending. - Patient labs and presentation are appropriate for treatment today. - Proceed with Imfinzi  1500 mg IV - Continue labs, follow-up, and Imfinzi  treatment every 4 weeks.  The patient understands the plans discussed today and is in agreement with them.  He knows to contact our office if he develops concerns prior to his next appointment.  I provided 20  minutes of face-to-face time during this encounter and > 50% was spent counseling as documented under my assessment and plan.    Jonathan BRAVO Lessen, NP  [facility] - A DEPT OF [facility] [address] [place] [address] Dept: [phone] Dept Fax: [phone]   No orders of the defined types were placed in this encounter.     CHIEF COMPLAINT:  CC: Squamous cell carcinoma of upper lobe of left lung  Current Treatment: Consolidation immunotherapy with Imfinzi  1500 mg IV every 4 weeks.  First dose given 01/28/2024.  Status post 2 cycles.  INTERVAL HISTORY:  Jonathan [name] is here today for repeat clinical assessment.  He was last seen by Cassie, PA, on 02/26/2024.  He presents for cycle 3 Imfinzi  1500 mg IV.  He reports good tolerance overall.  States he does have a persistent, dry cough.  Chest x-ray done on 6/11 showed no acute cardiopulmonary process.  He does have underlying emphysema.  We discussed using cough drops to help relieve symptoms.  He stated he would give that a try.  He denies chest pain, chest pressure, or shortness of breath. He denies headaches or visual disturbances. He denies abdominal pain, nausea, vomiting, or changes in bowel or bladder habits.  He denies fevers or chills. He denies pain. His appetite is good. His weight has been stable.  I have reviewed the past medical history, past surgical history, social history and family history with the patient and they are unchanged from previous note.  ALLERGIES:  has no known allergies.  MEDICATIONS:  Current Outpatient Medications  Medication Sig Dispense Refill   albuterol  (  VENTOLIN  HFA) 108 (90 Base) MCG/ACT inhaler Inhale 2 puffs into the lungs every 6 (six) hours as needed for wheezing or shortness of breath. 54 g 3   benzonatate  (TESSALON ) 100 MG capsule Take 1 capsule (100 mg total) by mouth 3 (three) times daily as needed. 30 capsule 2   Blood Glucose  Monitoring Suppl (FREESTYLE FREEDOM LITE) w/Device KIT Use Freestyle Freedom Lite meter to check blood sugar twice daily. DX:E11.65 1 kit 0   clopidogrel  (PLAVIX ) 75 MG tablet Take 1 tablet (75 mg total) by mouth daily with breakfast. 90 tablet 1   Dulaglutide  (TRULICITY ) 3 MG/0.5ML SOAJ INJECT 3 MG UNDER THE SKIN ONCE A WEEK 6 mL 3   empagliflozin  (JARDIANCE ) 25 MG TABS tablet Take 1 tablet (25 mg total) by mouth daily before breakfast. For diabetes E11.65 and E 11.29 90 tablet 3   furosemide  (LASIX ) 40 MG tablet Take 1 tablet (40 mg total) by mouth daily. 90 tablet 1   gabapentin  (NEURONTIN ) 100 MG capsule Take 100 mg by mouth 2 (two) times daily.     glucose blood (FREESTYLE LITE) test strip USE AS INSTRUCTED 100 strip 11   hydrOXYzine  (ATARAX ) 25 MG tablet Take 1 tablet by mouth at bedtime as needed.     insulin  glargine (LANTUS  SOLOSTAR) 100 UNIT/ML Solostar Pen Inject 15 Units into the skin daily. 15 mL 11   Insulin  Pen Needle 32G X 4 MM MISC Use 1 x a day 100 each 3   metFORMIN  (GLUCOPHAGE ) 500 MG tablet Take 1 tablet (500 mg total) by mouth 2 (two) times daily with a meal. 180 tablet 3   metoprolol  (TOPROL -XL) 200 MG 24 hr tablet Take 100 mg by mouth daily.     Multiple Vitamin (MULTIVITAMIN WITH MINERALS) TABS tablet Take 1 tablet by mouth daily.     OXYGEN  Inhale 2 L/min into the lungs at bedtime. PRN during day     sacubitril -valsartan  (ENTRESTO ) 24-26 MG Take 1 tablet by mouth 2 (two) times daily. 60 tablet 0   simvastatin  (ZOCOR ) 20 MG tablet Take 20 mg by mouth daily.     sucralfate  (CARAFATE ) 1 g tablet Take 1 tablet (1 g total) by mouth 4 (four) times daily -  with meals and at bedtime. 5 min before meals for radiation induced esophagitis 120 tablet 2   tamsulosin  (FLOMAX ) 0.4 MG CAPS capsule Take 1 capsule (0.4 mg total) by mouth daily after supper. 90 capsule 1   Tiotropium Bromide -Olodaterol (STIOLTO RESPIMAT ) 2.5-2.5 MCG/ACT AERS Inhale 2 puffs into the lungs daily. 12 g 4    No current facility-administered medications for this visit.   Facility-Administered Medications Ordered in Other Visits  Medication Dose Route Frequency Provider Last Rate Last Admin   0.9 %  sodium chloride  infusion   Intravenous Continuous Sherrod Sherrod, MD   Stopped at 03/27/24 1646    HISTORY OF PRESENT ILLNESS:   Oncology History  Squamous cell carcinoma of upper lobe of left lung (HCC)  10/23/2023 Initial Diagnosis   Squamous cell carcinoma of upper lobe of left lung (HCC)   10/23/2023 Cancer Staging   Staging form: Lung, AJCC V9 - Clinical: Stage IIIA (cT3, cN2a, cM0) - Signed by Sherrod Sherrod, MD on 10/23/2023 Method of lymph node assessment: Clinical   11/19/2023 - 12/16/2023 Chemotherapy   Patient is on Treatment Plan : LUNG Carboplatin  + Paclitaxel  + XRT q7d     01/30/2024 -  Chemotherapy   Patient is on Treatment Plan : LUNG NSCLC Durvalumab  (  1500) q28d         REVIEW OF SYSTEMS:   Constitutional: Denies fevers, chills or abnormal weight loss Eyes: Denies blurriness of vision Ears, nose, mouth, throat, and face: Denies mucositis or sore throat Respiratory: Denies dyspnea or wheezes. Has persistent dry cough. Cardiovascular: Denies palpitation, chest discomfort or lower extremity swelling Gastrointestinal:  Denies nausea, heartburn or change in bowel habits Skin: Denies abnormal skin rashes Lymphatics: Denies new lymphadenopathy or easy bruising Neurological:Denies numbness, tingling or new weaknesses Behavioral/Psych: Mood is stable, no new changes  All other systems were reviewed with the patient and are negative.   VITALS:   Today's Vitals   03/27/24 1320 03/27/24 1334  BP: 134/82   Pulse: 82   Resp: 17   Temp: 98.1 F (36.7 C)   SpO2: 96%   Weight: 215 lb 1.6 oz (97.6 kg)   PainSc:  0-No pain   Body mass index is 27.62 kg/m.   Wt Readings from Last 3 Encounters:  03/27/24 215 lb 1.6 oz (97.6 kg)  02/26/24 211 lb 3.2 oz (95.8 kg)  01/28/24  210 lb (95.3 kg)    Body mass index is 27.62 kg/m.  Performance status (ECOG): 1 - Symptomatic but completely ambulatory  PHYSICAL EXAM:   GENERAL:alert, no distress and comfortable SKIN: skin color, texture, turgor are normal, no rashes or significant lesions EYES: normal, Conjunctiva are pink and non-injected, sclera clear OROPHARYNX:no exudate, no erythema and lips, buccal mucosa, and tongue normal  NECK: supple, thyroid  normal size, non-tender, without nodularity LYMPH:  no palpable lymphadenopathy in the cervical, axillary or inguinal LUNGS: clear to auscultation and percussion with normal breathing effort. Mild expiratory wheezing noted.  HEART: regular rate & rhythm and no murmurs and no lower extremity edema ABDOMEN:abdomen soft, non-tender and normal bowel sounds Musculoskeletal:no cyanosis of digits and no clubbing  NEURO: alert & oriented x 3 with fluent speech, no focal motor/sensory deficits  LABORATORY DATA:  I have reviewed the data as listed    Component Value Date/Time   NA 141 03/27/2024 1244   NA 144 01/01/2024 0000   K 4.5 03/27/2024 1244   CL 108 03/27/2024 1244   CO2 27 03/27/2024 1244   GLUCOSE 152 (H) 03/27/2024 1244   BUN 16 03/27/2024 1244   BUN 14 01/01/2024 0000   CREATININE 1.36 (H) 03/27/2024 1244   CREATININE 1.04 11/28/2023 0803   CALCIUM 9.4 03/27/2024 1244   PROT 6.9 03/27/2024 1244   ALBUMIN 3.7 03/27/2024 1244   AST 14 (L) 03/27/2024 1244   ALT 11 03/27/2024 1244   ALKPHOS 58 03/27/2024 1244   BILITOT 0.5 03/27/2024 1244   GFRNONAA 51 (L) 03/27/2024 1244   GFRAA 48 (L) 11/19/2019 0929    Lab Results  Component Value Date   WBC 5.3 03/27/2024   NEUTROABS 3.1 03/27/2024   HGB 14.7 03/27/2024   HCT 43.5 03/27/2024   MCV 98.4 03/27/2024   PLT 151 03/27/2024

## 2024-03-27 NOTE — Patient Instructions (Signed)
 [facility] - A DEPT OF [facility]  Discharge Instructions: Thank you for choosing [facility] to provide your oncology and hematology care.   If you have a lab appointment with the Cancer Center, please go directly to the Cancer Center and check in at the registration area.   Wear comfortable clothing and clothing appropriate for easy access to any Portacath or PICC line.   We strive to give you quality time with your provider. You may need to reschedule your appointment if you arrive late (15 or more minutes).  Arriving late affects you and other patients whose appointments are after yours.  Also, if you miss three or more appointments without notifying the office, you may be dismissed from the clinic at the provider's discretion.      For prescription refill requests, have your pharmacy contact our office and allow 72 hours for refills to be completed.    Today you received the following chemotherapy and/or immunotherapy agents: Durvalumab       To help prevent nausea and vomiting after your treatment, we encourage you to take your nausea medication as directed.  BELOW ARE SYMPTOMS THAT SHOULD BE REPORTED IMMEDIATELY: *FEVER GREATER THAN 100.4 F (38 C) OR HIGHER *CHILLS OR SWEATING *NAUSEA AND VOMITING THAT IS NOT CONTROLLED WITH YOUR NAUSEA MEDICATION *UNUSUAL SHORTNESS OF BREATH *UNUSUAL BRUISING OR BLEEDING *URINARY PROBLEMS (pain or burning when urinating, or frequent urination) *BOWEL PROBLEMS (unusual diarrhea, constipation, pain near the anus) TENDERNESS IN MOUTH AND THROAT WITH OR WITHOUT PRESENCE OF ULCERS (sore throat, sores in mouth, or a toothache) UNUSUAL RASH, SWELLING OR PAIN  UNUSUAL VAGINAL DISCHARGE OR ITCHING   Items with * indicate a potential emergency and should be followed up as soon as possible or go to the Emergency Department if any problems should occur.  Please show the CHEMOTHERAPY ALERT CARD or  IMMUNOTHERAPY ALERT CARD at check-in to the Emergency Department and triage nurse.  Should you have questions after your visit or need to cancel or reschedule your appointment, please contact [facility] - A DEPT OF [facility]  Dept: [phone]  and follow the prompts.  Office hours are 8:00 a.m. to 4:30 p.m. Monday - Friday. Please note that voicemails left after 4:00 p.m. may not be returned until the following business day.  We are closed weekends and major holidays. You have access to a nurse at all times for urgent questions. Please call the main number to the clinic Dept: [phone] and follow the prompts.   For any non-urgent questions, you may also contact your provider using MyChart. We now offer e-Visits for anyone 18 and older to request care online for non-urgent symptoms. For details visit [email or web address].   Also download the MyChart app! Go to the app store, search "MyChart", open the app, select [facility], and log in with your MyChart username and password.

## 2024-03-27 NOTE — Assessment & Plan Note (Addendum)
 Stage IIIa (T3, N2, M0) non-small cell lung cancer, squamous cell carcinoma presented with left upper lobe lung mass abutting the pleura and ipsilateral mediastinal and left hilar lymph nodes. The left adrenal nodule is mildly FDG avid and indeterminate. Diagnosed in January 2025.   The patient completed a course of concurrent chemoradiation with weekly carboplatin  for AUC of 2 and paclitaxel  45 Mg/M2 status post 7 cycles.  Last dose was given 12/23/2023.   He is currently undergoing a consolidation immunotherapy with Imfinzi  1500 mg IV every 4 weeks.  -03/27/2024 - he presents for Cycle 3 Day 1 IV Imfinzi . He is tolerating this well.

## 2024-03-28 LAB — T4: T4, Total: 4.4 ug/dL — ABNORMAL LOW (ref 4.5–12.0)

## 2024-04-11 DIAGNOSIS — R531 Weakness: Secondary | ICD-10-CM | POA: Diagnosis not present

## 2024-04-11 DIAGNOSIS — N179 Acute kidney failure, unspecified: Secondary | ICD-10-CM | POA: Diagnosis not present

## 2024-04-11 DIAGNOSIS — I440 Atrioventricular block, first degree: Secondary | ICD-10-CM | POA: Diagnosis not present

## 2024-04-11 DIAGNOSIS — R000 Tachycardia, unspecified: Secondary | ICD-10-CM | POA: Diagnosis not present

## 2024-04-11 DIAGNOSIS — R55 Syncope and collapse: Secondary | ICD-10-CM | POA: Diagnosis not present

## 2024-04-11 DIAGNOSIS — E119 Type 2 diabetes mellitus without complications: Secondary | ICD-10-CM | POA: Diagnosis not present

## 2024-04-11 DIAGNOSIS — I10 Essential (primary) hypertension: Secondary | ICD-10-CM | POA: Diagnosis not present

## 2024-04-11 DIAGNOSIS — I959 Hypotension, unspecified: Secondary | ICD-10-CM | POA: Diagnosis not present

## 2024-04-11 DIAGNOSIS — R42 Dizziness and giddiness: Secondary | ICD-10-CM | POA: Diagnosis not present

## 2024-04-14 ENCOUNTER — Encounter: Payer: Self-pay | Admitting: Internal Medicine

## 2024-04-14 ENCOUNTER — Telehealth: Payer: Self-pay

## 2024-04-14 ENCOUNTER — Ambulatory Visit: Admitting: Internal Medicine

## 2024-04-14 VITALS — BP 144/76 | HR 87 | Temp 97.8°F | Ht 74.0 in | Wt 209.8 lb

## 2024-04-14 DIAGNOSIS — J9611 Chronic respiratory failure with hypoxia: Secondary | ICD-10-CM

## 2024-04-14 MED ORDER — BENZONATATE 200 MG PO CAPS: 200.0000 mg | ORAL_CAPSULE | Freq: Three times a day (TID) | ORAL | 4 refills | Status: DC | PRN

## 2024-04-14 NOTE — Progress Notes (Signed)
 HPI male former smoker followed for COPD, chronic respiratory failure with hypoxia, complicated by CAD/MI/ Stents/ PAD, DM2, polycythemia PFT- PFT 08/13/18- Diffusion moderately reduced. Normal spirometry flows and lung volumes10/23/12-  Normal spirometry flows with small- airway response to bronchodilator, normal lung volumes and moderately reduced diffusion. FEV1/FVC 0.79, DLCO 58% -06/18/12- 96%, 88%, 94% 449 M. Significant desaturation with exercise. Overnight Oximetry 02/07/17-room air-sustained desaturation less than or equal to 88% for over 7 hours Walk Test 2019-  Walk Test POC qualifying 05/06/23- Did not qualify for portable O2. Lowest saturation was 90% on room air. Walk Test POC Qualifying- 04/14/24- started 96% at rest on room air. After 1 lap O2 sat 87%, HR 96. O2 2L pulse he was able to walk 1 more lap O2 sat 91%, HR 101. He wasn't strong enough to walk a third lap. ------------------------------------------------------------------------------------------------------------    01/28/24- 88 year old male former smoker (60 pk yr) followed for COPD, chronic respiratory failure with Hypoxia,, complicated by CAD/MI/ stents/ PAD,CHF, H DM2, PVD,  stage IIIa (T3, N2, M0) non-small cell Lung cancer, squamous cell carcinoma presented with left upper lobe lung mass with suspicious mediastinal lymphadenopathy diagnosed in January 2025  O2 2 L/ [facility]-sleep  -Albuterol  hfa, Stiolto 2.5,  He has to help with wife at home who has dementia. Oncology follows for chemo/rad/immunotherapy Continues O2 2L for sleep and rarely during day. Breathing feels clear. Rare cough is nonproductive. Denies recognized nodes or chest pain.  04/14/24-  88 year old male former smoker (60 pk yr) followed for COPD, chronic respiratory failure with Hypoxia,, complicated by CAD/MI/ stents/ PAD,CHF, H DM2, PVD,  stage IIIa (T3, N2, M0) non-small cell Lung cancer, squamous cell carcinoma presented with left upper lobe lung  mass with suspicious mediastinal lymphadenopathy diagnosed in January 2025 / ChemoRadiation,  O2 2 L/ [facility]-sleep  -Albuterol  hfa, Stiolto 2.5,  He has to help with wife at home who has dementia. Walk Test POC Qualifying- 04/14/24- started 96% at rest on room air. After 1 lap O2 sat 87%, HR 96. O2 2L pulse he was able to walk 1 more lap O2 sat 91%, HR 101. He wasn't strong enough to walk a third lap. He brings brochures -interested in [employer] POC. Discussed the use of AI scribe software for clinical note transcription with the patient, who gave verbal consent to proceed.  History of Present Illness   Jonathan [name]. is an 88 year old male with lung cancer who presents for follow-up regarding his respiratory symptoms and oxygen  therapy.  He experiences a non-productive cough and uses cough syrup at home, though its effectiveness is uncertain. He uses two inhalers, Stiolto and albuterol , and does not need refills. Oxygen  therapy is used at night, and he has a portable unit for home use, which is heavy and cumbersome. Oxygen  saturation drops to the 80s during ambulation and at night but remains above the 70s. No significant changes in breathing or sputum production are noted.     Assessment and Plan:    Cough Intermittent cough without significant respiratory changes. Previous Tessalon  Perles use not recalled. - Prescribed stronger Tessalon  Perles (benzonatate  200 mg) up to three times daily as needed. - Sent prescription to [facility] on [address].  Chronic obstructive pulmonary disease requiring inhalers and supplemental oxygen  COPD managed with Stiolto and albuterol  inhalers. Uses supplemental oxygen  during sleep and while seated. Oxygen  desaturation to mid-80s during exertion. Discussed portable oxygen  concentrator options and insurance coverage. - Evaluate oxygen  levels during ambulation for portable oxygen   concentrator need. - Discuss portable oxygen  concentrator options with insurance  coverage considerations.   Chronic Respiratory Failure with Hypoxia Walk Test POC qualified today for POC portable O2 Has home O2 2L for sleep from [facility] POC 2L pulse for exertion- [facility] or his provider of choice   Actively managed bronchogenic carcinoma with ongoing infusion therapy Managed by Oncology/ Radiation Oncology Completed radiation and main chemotherapy. Receiving monthly infusion therapy.    History of Present Illness  CXR 02/26/24- IMPRESSION: No active cardiopulmonary disease. Emphysema. Decreased left upper lobe/hilar mass compared to prior radiograph.  ROS-see HPI  + = positive Constitutional:   No-   weight loss, night sweats, fevers, chills, fatigue, lassitude. HEENT:   No-  headaches, difficulty swallowing, tooth/dental problems, sore throat,       No-  sneezing, itching, ear ache, nasal congestion, post nasal drip,  CV:  No-   chest pain, orthopnea, PND, swelling in lower extremities, anasarca, dizziness, palpitations,                 Claudication R thigh Resp:   + shortness of breath with exertion or at rest.              No-   productive cough,  No non-productive cough,  No- coughing up of blood.              No-   change in color of mucus.   wheezing.   Skin: No-   rash or lesions. GI:  No-   heartburn, indigestion, abdominal pain, nausea, vomiting, GU:  MS:  No-   joint pain or swelling.   Neuro-     nothing unusual Psych:  No- change in mood or affect. No depression or anxiety.  No memory loss.  OBJ- Physical Exam    O2 3L pulse POC General- Alert, Oriented, Affect-appropriate, Distress- none acute.,  Skin- rash-none, lesions- none, excoriation- none Lymphadenopathy-  Head- atraumatic            Eyes- Gross vision intact, PERRLA, conjunctivae and secretions clear            Ears- Hearing, canals-normal            Nose-  no-Septal dev, mucus, polyps, erosion, perforation             Throat- Mallampati II , mucosa clear , drainage- none, tonsils-  atrophic Neck- flexible , trachea midline, no stridor , thyroid  nl, carotid no bruit Chest - symmetrical excursion , unlabored           Heart/CV- RRR/rapid , no murmur , no gallop  , no rub, nl s1 s2                           - JVD none , edema- none, stasis changes- none, varices- none           Lung- +diminished, cough-none , dullness-none, rub- none,            Chest wall-  Abd-  Br/ Gen/ Rectal- Not done, not indicated Extrem- cyanosis- none,  atrophy- none, strength- nl  Neuro- grossly intact to observation

## 2024-04-14 NOTE — Telephone Encounter (Signed)
 Copied from CRM [id number]. Topic: Clinical - Order For Equipment >> Apr 10, 2024  1:44 PM Sheckler PARAS wrote: Reason for CRM:  Pt is requesting order for portable oxygen  concentrator. He needs this to purchase the equipment through his local store.   Is unsure if OV is required to obtain the order, did look at [facility] schedule and he is not avail until 09/25.   Pt requested call back   CB#  [phone]  ATC x1 LVM on patient's phone to call our office back .

## 2024-04-14 NOTE — Patient Instructions (Addendum)
 Order- POC qualifying walk test    Dx Chronic Respiratory Failure with Hypoxia  Script sent to try benzonatate  200 mg   1 every 8 hours as needed for cough  Order- Add portable O2 2L pulse POC based on qualifying walk test today- either [facility] or his provider of choice.

## 2024-04-14 NOTE — Telephone Encounter (Signed)
 Spoke with patient regarding  prior message. Patient stated he wanted to discuss buying a POC with Dr.Young before he pay's out of pocket for it .   Made a office visit with Dr.Young  04/14/2024 at 1:30pm.  Patient's voice was understanding.Nothing else further needed.

## 2024-04-15 ENCOUNTER — Telehealth: Payer: Self-pay | Admitting: Internal Medicine

## 2024-04-15 NOTE — Telephone Encounter (Signed)
 Per Tommye at [facility], MD signed order on 09/25/23 for 3lpm, are you wanting to change the liter flow to 2lpm?    Please advise

## 2024-04-15 NOTE — Telephone Encounter (Signed)
 Ok to leave at 3L as per Dr Amon' order.

## 2024-04-16 NOTE — Telephone Encounter (Signed)
 Updated dme company. [employer]

## 2024-04-21 ENCOUNTER — Ambulatory Visit

## 2024-04-21 ENCOUNTER — Other Ambulatory Visit

## 2024-04-21 ENCOUNTER — Ambulatory Visit: Admitting: Internal Medicine

## 2024-04-22 ENCOUNTER — Inpatient Hospital Stay (HOSPITAL_BASED_OUTPATIENT_CLINIC_OR_DEPARTMENT_OTHER): Admitting: Internal Medicine

## 2024-04-22 ENCOUNTER — Inpatient Hospital Stay

## 2024-04-22 ENCOUNTER — Inpatient Hospital Stay: Attending: Internal Medicine

## 2024-04-22 VITALS — BP 166/88 | HR 88 | Temp 98.0°F | Resp 17 | Ht 74.0 in | Wt 215.0 lb

## 2024-04-22 DIAGNOSIS — Z923 Personal history of irradiation: Secondary | ICD-10-CM | POA: Insufficient documentation

## 2024-04-22 DIAGNOSIS — C3490 Malignant neoplasm of unspecified part of unspecified bronchus or lung: Secondary | ICD-10-CM | POA: Diagnosis not present

## 2024-04-22 DIAGNOSIS — J449 Chronic obstructive pulmonary disease, unspecified: Secondary | ICD-10-CM | POA: Insufficient documentation

## 2024-04-22 DIAGNOSIS — I2510 Atherosclerotic heart disease of native coronary artery without angina pectoris: Secondary | ICD-10-CM | POA: Insufficient documentation

## 2024-04-22 DIAGNOSIS — C3412 Malignant neoplasm of upper lobe, left bronchus or lung: Secondary | ICD-10-CM | POA: Insufficient documentation

## 2024-04-22 DIAGNOSIS — Z860100 Personal history of colon polyps, unspecified: Secondary | ICD-10-CM | POA: Diagnosis not present

## 2024-04-22 DIAGNOSIS — Z794 Long term (current) use of insulin: Secondary | ICD-10-CM | POA: Diagnosis not present

## 2024-04-22 DIAGNOSIS — Z5112 Encounter for antineoplastic immunotherapy: Secondary | ICD-10-CM | POA: Diagnosis not present

## 2024-04-22 DIAGNOSIS — I10 Essential (primary) hypertension: Secondary | ICD-10-CM | POA: Insufficient documentation

## 2024-04-22 DIAGNOSIS — Z7985 Long-term (current) use of injectable non-insulin antidiabetic drugs: Secondary | ICD-10-CM | POA: Diagnosis not present

## 2024-04-22 DIAGNOSIS — Z9221 Personal history of antineoplastic chemotherapy: Secondary | ICD-10-CM | POA: Diagnosis not present

## 2024-04-22 DIAGNOSIS — E1151 Type 2 diabetes mellitus with diabetic peripheral angiopathy without gangrene: Secondary | ICD-10-CM | POA: Diagnosis not present

## 2024-04-22 DIAGNOSIS — Z79899 Other long term (current) drug therapy: Secondary | ICD-10-CM | POA: Diagnosis not present

## 2024-04-22 DIAGNOSIS — E785 Hyperlipidemia, unspecified: Secondary | ICD-10-CM | POA: Diagnosis not present

## 2024-04-22 DIAGNOSIS — Z7984 Long term (current) use of oral hypoglycemic drugs: Secondary | ICD-10-CM | POA: Insufficient documentation

## 2024-04-22 DIAGNOSIS — N529 Male erectile dysfunction, unspecified: Secondary | ICD-10-CM | POA: Diagnosis not present

## 2024-04-22 DIAGNOSIS — Z7902 Long term (current) use of antithrombotics/antiplatelets: Secondary | ICD-10-CM | POA: Diagnosis not present

## 2024-04-22 LAB — CMP (CANCER CENTER ONLY)
ALT: 10 U/L (ref 0–44)
AST: 14 U/L — ABNORMAL LOW (ref 15–41)
Albumin: 3.9 g/dL (ref 3.5–5.0)
Alkaline Phosphatase: 65 U/L (ref 38–126)
Anion gap: 3 — ABNORMAL LOW (ref 5–15)
BUN: 16 mg/dL (ref 8–23)
CO2: 30 mmol/L (ref 22–32)
Calcium: 9.4 mg/dL (ref 8.9–10.3)
Chloride: 108 mmol/L (ref 98–111)
Creatinine: 1.14 mg/dL (ref 0.61–1.24)
GFR, Estimated: >60 mL/min (ref >60)
Glucose, Bld: 101 mg/dL — ABNORMAL HIGH (ref 70–99)
Potassium: 4.8 mmol/L (ref 3.5–5.1)
Sodium: 141 mmol/L (ref 135–145)
Total Bilirubin: 0.5 mg/dL (ref 0.0–1.2)
Total Protein: 7.2 g/dL (ref 6.5–8.1)

## 2024-04-22 LAB — CBC WITH DIFFERENTIAL (CANCER CENTER ONLY)
Abs Immature Granulocytes: 0.01 K/uL (ref 0.00–0.07)
Basophils Absolute: 0.1 K/uL (ref 0.0–0.1)
Basophils Relative: 1 %
Eosinophils Absolute: 0.1 K/uL (ref 0.0–0.5)
Eosinophils Relative: 2 %
HCT: 46.6 % (ref 39.0–52.0)
Hemoglobin: 15.7 g/dL (ref 13.0–17.0)
Immature Granulocytes: 0 %
Lymphocytes Relative: 26 %
Lymphs Abs: 1.9 K/uL (ref 0.7–4.0)
MCH: 32.4 pg (ref 26.0–34.0)
MCHC: 33.7 g/dL (ref 30.0–36.0)
MCV: 96.3 fL (ref 80.0–100.0)
Monocytes Absolute: 0.8 K/uL (ref 0.1–1.0)
Monocytes Relative: 11 %
Neutro Abs: 4.5 K/uL (ref 1.7–7.7)
Neutrophils Relative %: 60 %
Platelet Count: 171 K/uL (ref 150–400)
RBC: 4.84 MIL/uL (ref 4.22–5.81)
RDW: 14.2 % (ref 11.5–15.5)
WBC Count: 7.3 K/uL (ref 4.0–10.5)
nRBC: 0 % (ref 0.0–0.2)

## 2024-04-22 MED ORDER — SODIUM CHLORIDE 0.9 % IV SOLN
1500.0000 mg | Freq: Once | INTRAVENOUS | Status: AC
  Administered 2024-04-22: 1500 mg via INTRAVENOUS
  Filled 2024-04-22: qty 30

## 2024-04-22 MED ORDER — SODIUM CHLORIDE 0.9 % IV SOLN: INTRAVENOUS | Status: DC

## 2024-04-22 NOTE — Patient Instructions (Signed)

## 2024-04-22 NOTE — Progress Notes (Signed)
 [facility] [phone]   [phone]  OFFICE PROGRESS NOTE  Jonathan Aloysius BRAVO, MD [address] [place] [address]  DIAGNOSIS: stage IIIa (T3, N2, M0) non-small cell lung cancer, squamous cell carcinoma presented with left upper lobe lung mass with suspicious mediastinal lymphadenopathy diagnosed in January 2025   PRIOR THERAPY: Concurrent chemoradiation with weekly carboplatin  for AUC of 2 and paclitaxel  45 Mg/M2.  Status post 7 cycles.  Last dose was given 12/23/2023.  CURRENT THERAPY: Consolidation immunotherapy with Imfinzi  1500 Mg IV every 4 weeks.  First dose 01/28/2024.  Status post 3 cycles.  INTERVAL HISTORY: Jonathan [name] is a 88 years old African-American male returns to the clinic today for follow-up visit.Discussed the use of AI scribe software for clinical note transcription with the patient, who gave verbal consent to proceed.  History of Present Illness Jonathan [name]. is an 88 year old male with stage three non-small cell lung cancer, squamous cell carcinoma, who presents for evaluation before starting cycle four of immunotherapy.  Diagnosed with stage three non-small cell lung cancer, squamous cell carcinoma, in January 2025, he has completed concurrent chemoradiation with weekly carboplatin  and paclitaxel . He is currently undergoing consolidation treatment with Imfinzi .  He feels 'pretty good' with no specific complaints since the last visit. His breathing is stable, allowing him to perform activities like cutting grass. He uses supplemental oxygen  at night and has a tank available during the day if needed.  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     MEDICAL HISTORY: Past Medical History:  Diagnosis Date   CAD (coronary artery disease)    Two RCA stents remotely / 3rd RCA stent 2006    Colon polyps    s/p several Cscopes.   COPD (chronic obstructive pulmonary disease) (HCC)    on O2, nocturnal   Diabetes mellitus    dx aprox 2009   ED (erectile dysfunction)    has a vacumm device   Ejection fraction    Hyperlipidemia    dx in 90s   Hypertension    dx in the 14   Hypogonadism male    PVD (peripheral vascular disease) (HCC)    s/p stents at LE 2009, Dr Ladona   Shortness of breath    O2 Sat dropped to 82% walking on the treadmill, September, 2012    ALLERGIES:  has no known allergies.  MEDICATIONS:  Current Outpatient Medications  Medication Sig Dispense Refill   albuterol  (VENTOLIN  HFA) 108 (90 Base) MCG/ACT inhaler Inhale 2 puffs into the lungs every 6 (six) hours as needed for wheezing or shortness of breath. 54 g 3   benzonatate  (TESSALON ) 200 MG capsule Take 1 capsule (200 mg total) by mouth 3 (three) times daily as needed for cough. 30 capsule 4   Blood Glucose Monitoring Suppl (FREESTYLE FREEDOM LITE) w/Device KIT Use Freestyle Freedom Lite meter to check blood sugar twice daily. DX:E11.65 1 kit 0   clopidogrel  (PLAVIX ) 75 MG tablet Take 1 tablet (75 mg total) by mouth daily with breakfast. 90 tablet 1   Dulaglutide  (TRULICITY ) 3 MG/0.5ML SOAJ INJECT 3 MG UNDER THE SKIN ONCE A WEEK 6 mL 3   empagliflozin  (JARDIANCE ) 25 MG TABS tablet Take 1 tablet (25 mg total) by mouth daily before breakfast. For diabetes E11.65 and E 11.29 90  tablet 3   furosemide  (LASIX ) 40 MG tablet Take 1 tablet (40 mg total) by mouth daily. 90 tablet 1   gabapentin  (NEURONTIN ) 100 MG capsule Take 100 mg by mouth 2 (two) times daily.     glucose blood (FREESTYLE LITE) test strip USE AS INSTRUCTED 100 strip 11   hydrOXYzine  (ATARAX ) 25 MG tablet Take 1 tablet by mouth at bedtime as needed.     insulin  glargine (LANTUS  SOLOSTAR) 100 UNIT/ML Solostar Pen Inject 15 Units into the skin daily. 15 mL 11   Insulin  Pen Needle 32G X 4 MM MISC Use 1 x a day 100 each 3   metFORMIN  (GLUCOPHAGE ) 500  MG tablet Take 1 tablet (500 mg total) by mouth 2 (two) times daily with a meal. 180 tablet 3   metoprolol  (TOPROL -XL) 200 MG 24 hr tablet Take 100 mg by mouth daily.     Multiple Vitamin (MULTIVITAMIN WITH MINERALS) TABS tablet Take 1 tablet by mouth daily.     OXYGEN  Inhale 2 L/min into the lungs at bedtime. PRN during day     sacubitril -valsartan  (ENTRESTO ) 24-26 MG Take 1 tablet by mouth 2 (two) times daily. 60 tablet 0   simvastatin  (ZOCOR ) 20 MG tablet Take 20 mg by mouth daily.     sucralfate  (CARAFATE ) 1 g tablet Take 1 tablet (1 g total) by mouth 4 (four) times daily -  with meals and at bedtime. 5 min before meals for radiation induced esophagitis 120 tablet 2   tamsulosin  (FLOMAX ) 0.4 MG CAPS capsule Take 1 capsule (0.4 mg total) by mouth daily after supper. 90 capsule 1   Tiotropium Bromide -Olodaterol (STIOLTO RESPIMAT ) 2.5-2.5 MCG/ACT AERS Inhale 2 puffs into the lungs daily. 12 g 4   No current facility-administered medications for this visit.    SURGICAL HISTORY:  Past Surgical History:  Procedure Laterality Date   APPENDECTOMY     BRONCHIAL BIOPSY  10/01/2023   Procedure: BRONCHIAL BIOPSIES;  Surgeon: Brenna Adine CROME, DO;  Location: [facility];  Service: Pulmonary;;   BRONCHIAL BRUSHINGS  10/01/2023   Procedure: BRONCHIAL BRUSHINGS;  Surgeon: Brenna Adine CROME, DO;  Location: [facility];  Service: Pulmonary;;   BRONCHIAL WASHINGS  10/01/2023   Procedure: BRONCHIAL WASHINGS;  Surgeon: Brenna Adine CROME, DO;  Location: [facility];  Service: Pulmonary;;   CORONARY STENT INTERVENTION N/A 01/12/2022   Procedure: CORONARY STENT INTERVENTION;  Surgeon: Swaziland, Peter M, MD;  [facility]: [facility];  Service: Cardiovascular;  Laterality: N/A;   HEMOSTASIS CONTROL  10/01/2023   Procedure: HEMOSTASIS CONTROL;  Surgeon: Brenna Adine CROME, DO;  Location: [facility];  Service: Pulmonary;;   LEFT HEART CATH AND CORONARY ANGIOGRAPHY N/A 03/13/2023   Procedure: LEFT HEART CATH AND  CORONARY ANGIOGRAPHY;  Surgeon: Swaziland, Peter M, MD;  [facility] CV LAB;  Service: Cardiovascular;  Laterality: N/A;   RIGHT/LEFT HEART CATH AND CORONARY ANGIOGRAPHY N/A 01/11/2022   Procedure: RIGHT/LEFT HEART CATH AND CORONARY ANGIOGRAPHY;  Surgeon: Claudene Victory ORN, MD;  [facility];  Service: Cardiovascular;  Laterality: N/A;   TONSILLECTOMY     VIDEO BRONCHOSCOPY  10/01/2023   Procedure: VIDEO BRONCHOSCOPY WITHOUT FLUORO;  Surgeon: Brenna Adine CROME, DO;  Location: [facility];  Service: Pulmonary;;    REVIEW OF SYSTEMS:  A comprehensive review of systems was negative.   PHYSICAL EXAMINATION: General appearance: alert, cooperative, and no distress Head: Normocephalic, without obvious abnormality, atraumatic Neck: no adenopathy, no JVD, supple, symmetrical, trachea midline, and thyroid  not enlarged,  symmetric, no tenderness/mass/nodules Lymph nodes: Cervical, supraclavicular, and axillary nodes normal. Resp: clear to auscultation bilaterally Back: symmetric, no curvature. ROM normal. No CVA tenderness. Cardio: regular rate and rhythm, S1, S2 normal, no murmur, click, rub or gallop GI: soft, non-tender; bowel sounds normal; no masses,  no organomegaly Extremities: extremities normal, atraumatic, no cyanosis or edema  ECOG PERFORMANCE STATUS: 1 - Symptomatic but completely ambulatory  Blood pressure (!) 166/88, pulse 88, temperature 98 F (36.7 C), temperature source Temporal, resp. rate 17, height 6' 2 (1.88 m), weight 215 lb (97.5 kg), SpO2 99%.  LABORATORY DATA: Lab Results  Component Value Date   WBC 7.3 04/22/2024   HGB 15.7 04/22/2024   HCT 46.6 04/22/2024   MCV 96.3 04/22/2024   PLT 171 04/22/2024      Chemistry      Component Value Date/Time   NA 141 03/27/2024 1244   NA 144 01/01/2024 0000   K 4.5 03/27/2024 1244   CL 108 03/27/2024 1244   CO2 27 03/27/2024 1244   BUN 16 03/27/2024 1244   BUN 14 01/01/2024 0000   CREATININE 1.36 (H)  03/27/2024 1244   CREATININE 1.04 11/28/2023 0803   GLU 87 01/01/2024 0000      Component Value Date/Time   CALCIUM 9.4 03/27/2024 1244   ALKPHOS 58 03/27/2024 1244   AST 14 (L) 03/27/2024 1244   ALT 11 03/27/2024 1244   BILITOT 0.5 03/27/2024 1244       RADIOGRAPHIC STUDIES: No results found.   ASSESSMENT AND PLAN: This is a very pleasant 88 years old African-American male with stage IIIa (T3, N2, M0) non-small cell lung cancer, squamous cell carcinoma presented with left upper lobe lung mass with suspicious mediastinal lymphadenopathy diagnosed in January 2025  He underwent a course of concurrent chemoradiation with weekly carboplatin  for AUC of 2 and paclitaxel  45 Mg/M2 status post 7 cycles.  Last dose was given 12/23/2023. He is currently undergoing consolidation treatment with immunotherapy with Imfinzi  1500 Mg IV every 4 weeks status post 3 cycles.  He is here today for evaluation before starting cycle #4. Assessment and Plan Assessment & Plan Stage III non-small cell lung cancer, squamous cell carcinoma Stage III non-small cell lung cancer, squamous cell carcinoma, diagnosed in January 2025. Currently undergoing consolidation treatment with immunotherapy (Imfinzi ) following concurrent chemoradiation with weekly carboplatin  and paclitaxel . He is post three cycles of immunotherapy and is here for evaluation before starting cycle four. Reports feeling well with no new complaints. Breathing is well-managed, uses supplemental oxygen  as needed, especially at night and during exertion. - Administer cycle four of immunotherapy with Imfinzi . - Order a scan to be performed one week prior to the next visit. - Schedule follow-up appointment in four weeks. The patient was advised to call immediately if he has any concerning symptoms in the interval. The patient voices understanding of current disease status and treatment options and is in agreement with the current care plan.  All questions  were answered. The patient knows to call the clinic with any problems, questions or concerns. We can certainly see the patient much sooner if necessary.  The total time spent in the appointment was 20 minutes.  Disclaimer: This note was dictated with voice recognition software. Similar sounding words can inadvertently be transcribed and may not be corrected upon review.

## 2024-04-23 ENCOUNTER — Other Ambulatory Visit

## 2024-04-23 ENCOUNTER — Ambulatory Visit: Admitting: Internal Medicine

## 2024-04-23 ENCOUNTER — Ambulatory Visit

## 2024-04-25 ENCOUNTER — Other Ambulatory Visit: Payer: Self-pay | Admitting: Internal Medicine

## 2024-04-29 ENCOUNTER — Encounter: Payer: Self-pay | Admitting: Internal Medicine

## 2024-04-29 ENCOUNTER — Ambulatory Visit (INDEPENDENT_AMBULATORY_CARE_PROVIDER_SITE_OTHER): Admitting: Internal Medicine

## 2024-04-29 VITALS — BP 118/68 | HR 70 | Temp 97.5°F | Resp 20 | Ht 74.0 in | Wt 209.5 lb

## 2024-04-29 DIAGNOSIS — J449 Chronic obstructive pulmonary disease, unspecified: Secondary | ICD-10-CM

## 2024-04-29 DIAGNOSIS — J9611 Chronic respiratory failure with hypoxia: Secondary | ICD-10-CM | POA: Diagnosis not present

## 2024-04-29 NOTE — Patient Instructions (Addendum)
 From this office to go straight home.  Immediately put yourself back on oxygen .  You need to use oxygen  supplements 24/7.  Vaccines are recommended: Flu and a COVID booster this fall  Next visit with me in 4 months.

## 2024-04-29 NOTE — Progress Notes (Signed)
 Subjective:    Patient ID: Jonathan [name]., male    DOB: 09-12-1936, 88 y.o.   MRN: [id number]  DOS:  04/29/2024 Type of visit - description: Routine office visit.  Since the last office visit he went to the ER and saw other  doctors, chart reviewed and summarized below.  7/20/2025BETHA Sous to the ER with near syncope. Initially BP was low subsequently did improve.  Symptoms felt to be due to dehydration and overheating.  04/14/2024: Saw pulmonary for COPD, prescribed benzonatate  [place], added portable O2 to 2 L. 04/22/2024: Saw oncology, blood pressure was 166/88, heart rate 88. Received immunotherapy. Next visit in 4 weeks.  Upon arrival, he did not have oxygen  on him, he was tachycardic, O2 sat 87%. He reports that he was feeling okay with no chest pain and mild DOE (at baseline). He was on no apparent distress, nondiaphoretic, speaking in complete sentences. Denies fever or chills. No edema. Mild cough at baseline per patient.    Review of Systems See above   Past Medical History:  Diagnosis Date   CAD (coronary artery disease)    Two RCA stents remotely / 3rd RCA stent 2006   Colon polyps    s/p several Cscopes.   COPD (chronic obstructive pulmonary disease) (HCC)    on O2, nocturnal   Diabetes mellitus    dx aprox 2009   ED (erectile dysfunction)    has a vacumm device   Ejection fraction    Hyperlipidemia    dx in 90s   Hypertension    dx in the 28   Hypogonadism male    PVD (peripheral vascular disease) (HCC)    s/p stents at LE 2009, Dr Ladona   Shortness of breath    O2 Sat dropped to 82% walking on the treadmill, September, 2012    Past Surgical History:  Procedure Laterality Date   APPENDECTOMY     BRONCHIAL BIOPSY  10/01/2023   Procedure: BRONCHIAL BIOPSIES;  Surgeon: Brenna Adine CROME, DO;  [facility]: [facility];  Service: Pulmonary;;   BRONCHIAL BRUSHINGS  10/01/2023   Procedure: BRONCHIAL BRUSHINGS;  Surgeon: Brenna Adine CROME, DO;  [facility];  Service: Pulmonary;;   BRONCHIAL WASHINGS  10/01/2023   Procedure: BRONCHIAL WASHINGS;  Surgeon: Brenna Adine CROME, DO;  [facility];  Service: Pulmonary;;   CORONARY STENT INTERVENTION N/A 01/12/2022   Procedure: CORONARY STENT INTERVENTION;  Surgeon: Swaziland, Peter M, MD;  [facility];  Service: Cardiovascular;  Laterality: N/A;   HEMOSTASIS CONTROL  10/01/2023   Procedure: HEMOSTASIS CONTROL;  Surgeon: Brenna Adine CROME, DO;  [facility];  Service: Pulmonary;;   LEFT HEART CATH AND CORONARY ANGIOGRAPHY N/A 03/13/2023   Procedure: LEFT HEART CATH AND CORONARY ANGIOGRAPHY;  Surgeon: Swaziland, Peter M, MD;  Location: [facility] INVASIVE CV LAB;  Service: Cardiovascular;  Laterality: N/A;   RIGHT/LEFT HEART CATH AND CORONARY ANGIOGRAPHY N/A 01/11/2022   Procedure: RIGHT/LEFT HEART CATH AND CORONARY ANGIOGRAPHY;  Surgeon: Claudene Victory ORN, MD;  [facility];  Service: Cardiovascular;  Laterality: N/A;   TONSILLECTOMY     VIDEO BRONCHOSCOPY  10/01/2023   Procedure: VIDEO BRONCHOSCOPY WITHOUT FLUORO;  Surgeon: Brenna Adine CROME, DO;  [facility]: [facility];  Service: Pulmonary;;    Current Outpatient Medications  Medication Instructions   albuterol  (VENTOLIN  HFA) 108 (90 Base) MCG/ACT inhaler 2 puffs, Inhalation, Every 6 hours PRN   benzonatate  (TESSALON ) 200 mg, Oral, 3 times daily PRN  Blood Glucose Monitoring Suppl (FREESTYLE FREEDOM LITE) w/Device KIT Use Freestyle Freedom Lite meter to check blood sugar twice daily. DX:E11.65   clopidogrel  (PLAVIX ) 75 mg, Oral, Daily with breakfast   Dulaglutide  (TRULICITY ) 3 MG/0.5ML SOAJ INJECT 3 MG UNDER THE SKIN ONCE A WEEK   empagliflozin  (JARDIANCE ) 25 mg, Oral, Daily before breakfast, For diabetes E11.65 and E 11.29   furosemide  (LASIX ) 40 mg, Oral, Daily   gabapentin  (NEURONTIN ) 100 mg, 2 times daily   glucose blood (FREESTYLE LITE) test strip USE AS INSTRUCTED   hydrOXYzine  (ATARAX ) 25 MG tablet 1  tablet, At bedtime PRN   Insulin  Pen Needle 32G X 4 MM MISC Use 1 x a day   Lantus  SoloStar 15 Units, Subcutaneous, Daily   metFORMIN  (GLUCOPHAGE ) 500 mg, Oral, 2 times daily with meals   metoprolol  (TOPROL -XL) 100 mg, Daily   Multiple Vitamin (MULTIVITAMIN WITH MINERALS) TABS tablet 1 tablet, Daily   OXYGEN  2 L/min, Inhalation, Continuous   sacubitril -valsartan  (ENTRESTO ) 24-26 MG 1 tablet, Oral, 2 times daily   simvastatin  (ZOCOR ) 20 mg, Daily   sucralfate  (CARAFATE ) 1 g, Oral, 3 times daily with meals & bedtime, 5 min before meals for radiation induced esophagitis   tamsulosin  (FLOMAX ) 0.4 mg, Oral, Daily after supper   Tiotropium Bromide -Olodaterol (STIOLTO RESPIMAT ) 2.5-2.5 MCG/ACT AERS 2 puffs, Inhalation, Daily       Objective:   Physical Exam BP 118/68   Pulse 70   Temp (!) 97.5 F (36.4 C) (Oral)   Resp 20   Ht 6' 2 (1.88 m)   Wt 209 lb 8 oz (95 kg)   SpO2 97% Comment: when placed on 2L/min  BMI 26.90 kg/m  General:   Well developed, NAD, BMI noted. HEENT:  Normocephalic . Face symmetric, atraumatic Lungs:  CTA B Normal respiratory effort, no intercostal retractions, no accessory muscle use. Heart: After he was placed on oxygen , heart rate decreased to 70, regular. Lower extremities: no pretibial edema bilaterally  Skin: Not pale. Not jaundice Neurologic:  alert & oriented X3.  Speech normal, gait appropriate for age and unassisted Psych--  Cognition and judgment appear intact.  Cooperative with normal attention span and concentration.  Behavior appropriate. No anxious or depressed appearing.      Assessment      Assessment DM dx ~2009, complicated by CAD, PVD, mild DM neuropathy.  Per Endo hypogonadism.  Per Endo HTN Hyperlipidemia Pulmonary: --COPD, nocturnal O2 prn --Non-small cell -SCC lung cancer (LUL) Dx 09-2023: Chemotherapy, radiation therapy. 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 Cath: Nonobstructive CAD Polycythemia: Saw hematology 11-2018, felt to be due to chronic respiratory failure.  Not likely polycythemia vera  PLAN    Recent labs reviewed Last creatinine 1.1, potassium 4.8, hemoglobin 15.7.  Blood sugar 101 Chronic respiratory failure with hypoxia: Upon arrival without oxygen  he was tachycardic and hypoxic, was not in distress, not diaphoretic, no chest pain. He was placed on oxygen  and heart rate decreased to 70, O2 sat increased to 97% He has a portable oxygen  device at home, strongly recommend to use it and to stay on oxygen  24/7.  Patient verbalized understanding. Rec to go back home immediately and start oxygen , states he will do that. DM: Per Endo, Last A1c  7.4. Lung cancer: Per pulmonary. Vaccine advice: Provided RTC 4 months

## 2024-04-30 ENCOUNTER — Telehealth: Payer: Self-pay

## 2024-04-30 NOTE — Telephone Encounter (Signed)
 Spoke w/ Pt- he is feeling better today, pulse rate and O2 is normal. He did want to let PCP know that he went to [place] yesterday after visit to get paperwork for [employer] and called them yesterday after he got home to order an easier portable oxygen  concentrator for him to carry. He informed that he should be receiving that tomorrow.

## 2024-05-01 ENCOUNTER — Telehealth: Payer: Self-pay

## 2024-05-01 NOTE — Telephone Encounter (Signed)
 Received CMN from [employer] for oxygen  concentrator.  Placed on Dr. Saundra desk in [facility].  Once signed, will fax back to [employer] at [phone].

## 2024-05-01 NOTE — Telephone Encounter (Signed)
 Noted, thank you!

## 2024-05-01 NOTE — Telephone Encounter (Signed)
 This encounter was created in error - please disregard.

## 2024-05-01 NOTE — Assessment & Plan Note (Signed)
 Recent labs reviewed Last creatinine 1.1, potassium 4.8, hemoglobin 15.7.  Blood sugar 101 Chronic respiratory failure with hypoxia: Upon arrival without oxygen  he was tachycardic and hypoxic, was not in distress, not diaphoretic, no chest pain. He was placed on oxygen  and heart rate decreased to 70, O2 sat increased to 97% He has a portable oxygen  device at home, strongly recommend to use it and to stay on oxygen  24/7.  Patient verbalized understanding. Rec to go back home immediately and start oxygen , states he will do that. DM: Per Endo, Last A1c 7.4. Lung cancer: Per pulmonary. Vaccine advice: Provided RTC 4 months

## 2024-05-05 NOTE — Telephone Encounter (Signed)
 CMN signed by Dr. Neysa.  Faxed forms to [employer] at [phone]

## 2024-05-06 ENCOUNTER — Observation Stay (HOSPITAL_COMMUNITY)

## 2024-05-06 ENCOUNTER — Emergency Department (HOSPITAL_COMMUNITY)

## 2024-05-06 ENCOUNTER — Encounter (HOSPITAL_COMMUNITY): Payer: Self-pay

## 2024-05-06 ENCOUNTER — Other Ambulatory Visit: Payer: Self-pay

## 2024-05-06 ENCOUNTER — Emergency Department (HOSPITAL_BASED_OUTPATIENT_CLINIC_OR_DEPARTMENT_OTHER)

## 2024-05-06 ENCOUNTER — Inpatient Hospital Stay (HOSPITAL_COMMUNITY)
Admission: EM | Admit: 2024-05-06 | Discharge: 2024-05-08 | DRG: 291 | Disposition: A | Discharge status: Home / Self Care | Attending: Internal Medicine | Admitting: Internal Medicine

## 2024-05-06 DIAGNOSIS — J9621 Acute and chronic respiratory failure with hypoxia: Secondary | ICD-10-CM | POA: Diagnosis present

## 2024-05-06 DIAGNOSIS — R062 Wheezing: Secondary | ICD-10-CM | POA: Diagnosis not present

## 2024-05-06 DIAGNOSIS — Z9221 Personal history of antineoplastic chemotherapy: Secondary | ICD-10-CM

## 2024-05-06 DIAGNOSIS — I5043 Acute on chronic combined systolic (congestive) and diastolic (congestive) heart failure: Secondary | ICD-10-CM | POA: Diagnosis not present

## 2024-05-06 DIAGNOSIS — Z7985 Long-term (current) use of injectable non-insulin antidiabetic drugs: Secondary | ICD-10-CM | POA: Diagnosis not present

## 2024-05-06 DIAGNOSIS — R0602 Shortness of breath: Secondary | ICD-10-CM

## 2024-05-06 DIAGNOSIS — I3139 Other pericardial effusion (noninflammatory): Secondary | ICD-10-CM

## 2024-05-06 DIAGNOSIS — E1151 Type 2 diabetes mellitus with diabetic peripheral angiopathy without gangrene: Secondary | ICD-10-CM | POA: Diagnosis present

## 2024-05-06 DIAGNOSIS — I739 Peripheral vascular disease, unspecified: Secondary | ICD-10-CM | POA: Diagnosis present

## 2024-05-06 DIAGNOSIS — E785 Hyperlipidemia, unspecified: Secondary | ICD-10-CM | POA: Diagnosis present

## 2024-05-06 DIAGNOSIS — E291 Testicular hypofunction: Secondary | ICD-10-CM | POA: Diagnosis present

## 2024-05-06 DIAGNOSIS — N1832 Chronic kidney disease, stage 3b: Secondary | ICD-10-CM | POA: Diagnosis present

## 2024-05-06 DIAGNOSIS — Z8249 Family history of ischemic heart disease and other diseases of the circulatory system: Secondary | ICD-10-CM | POA: Diagnosis not present

## 2024-05-06 DIAGNOSIS — I5020 Unspecified systolic (congestive) heart failure: Secondary | ICD-10-CM | POA: Diagnosis not present

## 2024-05-06 DIAGNOSIS — C3490 Malignant neoplasm of unspecified part of unspecified bronchus or lung: Secondary | ICD-10-CM | POA: Diagnosis present

## 2024-05-06 DIAGNOSIS — Z9981 Dependence on supplemental oxygen: Secondary | ICD-10-CM

## 2024-05-06 DIAGNOSIS — E1122 Type 2 diabetes mellitus with diabetic chronic kidney disease: Secondary | ICD-10-CM | POA: Diagnosis present

## 2024-05-06 DIAGNOSIS — I2510 Atherosclerotic heart disease of native coronary artery without angina pectoris: Secondary | ICD-10-CM | POA: Diagnosis not present

## 2024-05-06 DIAGNOSIS — F32A Depression, unspecified: Secondary | ICD-10-CM | POA: Diagnosis present

## 2024-05-06 DIAGNOSIS — Z8042 Family history of malignant neoplasm of prostate: Secondary | ICD-10-CM

## 2024-05-06 DIAGNOSIS — R918 Other nonspecific abnormal finding of lung field: Secondary | ICD-10-CM | POA: Diagnosis not present

## 2024-05-06 DIAGNOSIS — I517 Cardiomegaly: Secondary | ICD-10-CM | POA: Diagnosis not present

## 2024-05-06 DIAGNOSIS — J90 Pleural effusion, not elsewhere classified: Secondary | ICD-10-CM | POA: Diagnosis present

## 2024-05-06 DIAGNOSIS — Z955 Presence of coronary angioplasty implant and graft: Secondary | ICD-10-CM

## 2024-05-06 DIAGNOSIS — Z833 Family history of diabetes mellitus: Secondary | ICD-10-CM

## 2024-05-06 DIAGNOSIS — Z79899 Other long term (current) drug therapy: Secondary | ICD-10-CM | POA: Diagnosis not present

## 2024-05-06 DIAGNOSIS — J441 Chronic obstructive pulmonary disease with (acute) exacerbation: Secondary | ICD-10-CM | POA: Diagnosis present

## 2024-05-06 DIAGNOSIS — R059 Cough, unspecified: Secondary | ICD-10-CM | POA: Diagnosis not present

## 2024-05-06 DIAGNOSIS — C3412 Malignant neoplasm of upper lobe, left bronchus or lung: Secondary | ICD-10-CM | POA: Diagnosis present

## 2024-05-06 DIAGNOSIS — J910 Malignant pleural effusion: Secondary | ICD-10-CM | POA: Diagnosis not present

## 2024-05-06 DIAGNOSIS — R9389 Abnormal findings on diagnostic imaging of other specified body structures: Secondary | ICD-10-CM | POA: Diagnosis present

## 2024-05-06 DIAGNOSIS — Z87891 Personal history of nicotine dependence: Secondary | ICD-10-CM

## 2024-05-06 DIAGNOSIS — Z7984 Long term (current) use of oral hypoglycemic drugs: Secondary | ICD-10-CM | POA: Diagnosis not present

## 2024-05-06 DIAGNOSIS — Z923 Personal history of irradiation: Secondary | ICD-10-CM

## 2024-05-06 DIAGNOSIS — Z7902 Long term (current) use of antithrombotics/antiplatelets: Secondary | ICD-10-CM

## 2024-05-06 DIAGNOSIS — Z860100 Personal history of colon polyps, unspecified: Secondary | ICD-10-CM

## 2024-05-06 DIAGNOSIS — Z794 Long term (current) use of insulin: Secondary | ICD-10-CM

## 2024-05-06 DIAGNOSIS — Z823 Family history of stroke: Secondary | ICD-10-CM

## 2024-05-06 DIAGNOSIS — I130 Hypertensive heart and chronic kidney disease with heart failure and stage 1 through stage 4 chronic kidney disease, or unspecified chronic kidney disease: Principal | ICD-10-CM | POA: Diagnosis present

## 2024-05-06 DIAGNOSIS — I10 Essential (primary) hypertension: Secondary | ICD-10-CM | POA: Diagnosis present

## 2024-05-06 DIAGNOSIS — F419 Anxiety disorder, unspecified: Secondary | ICD-10-CM | POA: Diagnosis present

## 2024-05-06 DIAGNOSIS — I509 Heart failure, unspecified: Secondary | ICD-10-CM

## 2024-05-06 LAB — COMPREHENSIVE METABOLIC PANEL WITH GFR
ALT: 15 U/L (ref 0–44)
AST: 22 U/L (ref 15–41)
Albumin: 3.5 g/dL (ref 3.5–5.0)
Alkaline Phosphatase: 71 U/L (ref 38–126)
Anion gap: 13 (ref 5–15)
BUN: 23 mg/dL (ref 8–23)
CO2: 23 mmol/L (ref 22–32)
Calcium: 8.9 mg/dL (ref 8.9–10.3)
Chloride: 101 mmol/L (ref 98–111)
Creatinine, Ser: 1.37 mg/dL — ABNORMAL HIGH (ref 0.61–1.24)
GFR, Estimated: 50 mL/min — ABNORMAL LOW (ref >60)
Glucose, Bld: 144 mg/dL — ABNORMAL HIGH (ref 70–99)
Potassium: 3.6 mmol/L (ref 3.5–5.1)
Sodium: 137 mmol/L (ref 135–145)
Total Bilirubin: 0.9 mg/dL (ref 0.0–1.2)
Total Protein: 7.5 g/dL (ref 6.5–8.1)

## 2024-05-06 LAB — CBC WITH DIFFERENTIAL/PLATELET
Abs Immature Granulocytes: 0.03 K/uL (ref 0.00–0.07)
Basophils Absolute: 0 K/uL (ref 0.0–0.1)
Basophils Relative: 0 %
Eosinophils Absolute: 0.2 K/uL (ref 0.0–0.5)
Eosinophils Relative: 2 %
HCT: 48.8 % (ref 39.0–52.0)
Hemoglobin: 16 g/dL (ref 13.0–17.0)
Immature Granulocytes: 0 %
Lymphocytes Relative: 29 %
Lymphs Abs: 2.3 K/uL (ref 0.7–4.0)
MCH: 32.3 pg (ref 26.0–34.0)
MCHC: 32.8 g/dL (ref 30.0–36.0)
MCV: 98.4 fL (ref 80.0–100.0)
Monocytes Absolute: 0.7 K/uL (ref 0.1–1.0)
Monocytes Relative: 9 %
Neutro Abs: 4.6 K/uL (ref 1.7–7.7)
Neutrophils Relative %: 60 %
Platelets: 208 K/uL (ref 150–400)
RBC: 4.96 MIL/uL (ref 4.22–5.81)
RDW: 14.5 % (ref 11.5–15.5)
WBC: 7.9 K/uL (ref 4.0–10.5)
nRBC: 0 % (ref 0.0–0.2)

## 2024-05-06 LAB — ECHOCARDIOGRAM COMPLETE
Area-P 1/2: 8.43 cm2
Calc EF: 30.9 %
S' Lateral: 4.1 cm
Single Plane A2C EF: 26.9 %
Single Plane A4C EF: 33.7 %

## 2024-05-06 LAB — MAGNESIUM: Magnesium: 2.4 mg/dL (ref 1.7–2.4)

## 2024-05-06 LAB — BRAIN NATRIURETIC PEPTIDE: B Natriuretic Peptide: 502.4 pg/mL — ABNORMAL HIGH (ref 0.0–100.0)

## 2024-05-06 LAB — PHOSPHORUS: Phosphorus: 3.5 mg/dL (ref 2.5–4.6)

## 2024-05-06 LAB — TROPONIN I (HIGH SENSITIVITY)
Troponin I (High Sensitivity): 22 ng/L — ABNORMAL HIGH (ref <18)
Troponin I (High Sensitivity): 22 ng/L — ABNORMAL HIGH (ref <18)

## 2024-05-06 LAB — GLUCOSE, CAPILLARY
Glucose-Capillary: 123 mg/dL — ABNORMAL HIGH (ref 70–99)
Glucose-Capillary: 110 mg/dL — ABNORMAL HIGH (ref 70–99)
Glucose-Capillary: 214 mg/dL — ABNORMAL HIGH (ref 70–99)

## 2024-05-06 LAB — PROCALCITONIN: Procalcitonin: <0.1 ng/mL

## 2024-05-06 MED ORDER — TAMSULOSIN HCL 0.4 MG PO CAPS
0.4000 mg | ORAL_CAPSULE | Freq: Every day | ORAL | Status: DC
  Administered 2024-05-06 – 2024-05-07 (×2): 0.4 mg via ORAL
  Filled 2024-05-06 (×2): qty 1

## 2024-05-06 MED ORDER — ALBUTEROL SULFATE (2.5 MG/3ML) 0.083% IN NEBU
2.5000 mg | INHALATION_SOLUTION | Freq: Two times a day (BID) | RESPIRATORY_TRACT | Status: DC
  Administered 2024-05-06 – 2024-05-08 (×6): 2.5 mg via RESPIRATORY_TRACT
  Filled 2024-05-06 (×5): qty 3

## 2024-05-06 MED ORDER — METOPROLOL TARTRATE 50 MG PO TABS
100.0000 mg | ORAL_TABLET | Freq: Two times a day (BID) | ORAL | Status: DC
  Administered 2024-05-06 – 2024-05-08 (×5): 100 mg via ORAL
  Filled 2024-05-06 (×5): qty 2

## 2024-05-06 MED ORDER — ACETAMINOPHEN 650 MG RE SUPP: 650.0000 mg | Freq: Four times a day (QID) | RECTAL | Status: DC | PRN

## 2024-05-06 MED ORDER — ACETAMINOPHEN 325 MG PO TABS: 650.0000 mg | ORAL_TABLET | Freq: Four times a day (QID) | ORAL | Status: DC | PRN

## 2024-05-06 MED ORDER — UMECLIDINIUM BROMIDE 62.5 MCG/ACT IN AEPB
1.0000 | INHALATION_SPRAY | Freq: Every day | RESPIRATORY_TRACT | Status: DC
  Administered 2024-05-06 – 2024-05-08 (×3): 1 via RESPIRATORY_TRACT
  Filled 2024-05-06: qty 7

## 2024-05-06 MED ORDER — METHYLPREDNISOLONE SODIUM SUCC 125 MG IJ SOLR
125.0000 mg | Freq: Once | INTRAMUSCULAR | Status: AC
  Administered 2024-05-06: 125 mg via INTRAVENOUS
  Filled 2024-05-06: qty 2

## 2024-05-06 MED ORDER — ONDANSETRON HCL 4 MG/2ML IJ SOLN: 4.0000 mg | Freq: Four times a day (QID) | INTRAMUSCULAR | Status: DC | PRN

## 2024-05-06 MED ORDER — ALBUTEROL SULFATE (2.5 MG/3ML) 0.083% IN NEBU
2.5000 mg | INHALATION_SOLUTION | RESPIRATORY_TRACT | Status: DC | PRN
  Filled 2024-05-06: qty 3

## 2024-05-06 MED ORDER — FUROSEMIDE 10 MG/ML IJ SOLN
40.0000 mg | Freq: Two times a day (BID) | INTRAMUSCULAR | Status: DC
  Administered 2024-05-06 – 2024-05-07 (×2): 40 mg via INTRAVENOUS
  Filled 2024-05-06 (×2): qty 4

## 2024-05-06 MED ORDER — SODIUM CHLORIDE 0.9% FLUSH
3.0000 mL | Freq: Once | INTRAVENOUS | Status: AC
  Administered 2024-05-06: 3 mL via INTRAVENOUS

## 2024-05-06 MED ORDER — ALBUTEROL SULFATE (2.5 MG/3ML) 0.083% IN NEBU: 2.5000 mg | INHALATION_SOLUTION | Freq: Once | RESPIRATORY_TRACT | Status: DC

## 2024-05-06 MED ORDER — SIMVASTATIN 20 MG PO TABS
20.0000 mg | ORAL_TABLET | Freq: Every day | ORAL | Status: DC
  Administered 2024-05-06 – 2024-05-08 (×3): 20 mg via ORAL
  Filled 2024-05-06 (×3): qty 1

## 2024-05-06 MED ORDER — IOHEXOL 300 MG/ML  SOLN
75.0000 mL | Freq: Once | INTRAMUSCULAR | Status: AC | PRN
  Administered 2024-05-06: 75 mL via INTRAVENOUS

## 2024-05-06 MED ORDER — SACUBITRIL-VALSARTAN 24-26 MG PO TABS
1.0000 | ORAL_TABLET | Freq: Two times a day (BID) | ORAL | Status: DC
  Administered 2024-05-06 – 2024-05-08 (×5): 1 via ORAL
  Filled 2024-05-06 (×5): qty 1

## 2024-05-06 MED ORDER — CLOPIDOGREL BISULFATE 75 MG PO TABS
75.0000 mg | ORAL_TABLET | Freq: Every day | ORAL | Status: DC
  Administered 2024-05-06 – 2024-05-08 (×3): 75 mg via ORAL
  Filled 2024-05-06 (×3): qty 1

## 2024-05-06 MED ORDER — METFORMIN HCL 500 MG PO TABS: 500.0000 mg | ORAL_TABLET | Freq: Two times a day (BID) | ORAL | Status: DC

## 2024-05-06 MED ORDER — IPRATROPIUM-ALBUTEROL 0.5-2.5 (3) MG/3ML IN SOLN: 3.0000 mL | Freq: Four times a day (QID) | RESPIRATORY_TRACT | Status: DC

## 2024-05-06 MED ORDER — POTASSIUM CHLORIDE CRYS ER 20 MEQ PO TBCR
40.0000 meq | EXTENDED_RELEASE_TABLET | Freq: Once | ORAL | Status: AC
Start: 2024-05-06 — End: 2024-05-06
  Administered 2024-05-06: 40 meq via ORAL
  Filled 2024-05-06: qty 2

## 2024-05-06 MED ORDER — SODIUM CHLORIDE 0.9 % IV SOLN
1.0000 g | INTRAVENOUS | Status: DC
  Administered 2024-05-06 – 2024-05-07 (×2): 1 g via INTRAVENOUS
  Filled 2024-05-06 (×2): qty 10

## 2024-05-06 MED ORDER — INSULIN ASPART 100 UNIT/ML IJ SOLN: 0.0000 [IU] | Freq: Three times a day (TID) | INTRAMUSCULAR | Status: DC

## 2024-05-06 MED ORDER — PREDNISONE 20 MG PO TABS
40.0000 mg | ORAL_TABLET | Freq: Every day | ORAL | Status: DC
  Administered 2024-05-07 – 2024-05-08 (×2): 40 mg via ORAL
  Filled 2024-05-06 (×2): qty 2

## 2024-05-06 MED ORDER — INSULIN ASPART 100 UNIT/ML IJ SOLN
0.0000 [IU] | Freq: Three times a day (TID) | INTRAMUSCULAR | Status: DC
  Administered 2024-05-07: 3 [IU] via SUBCUTANEOUS
  Administered 2024-05-07: 7 [IU] via SUBCUTANEOUS

## 2024-05-06 MED ORDER — MAGNESIUM SULFATE 2 GM/50ML IV SOLN
2.0000 g | Freq: Once | INTRAVENOUS | Status: AC
  Administered 2024-05-06: 2 g via INTRAVENOUS
  Filled 2024-05-06: qty 50

## 2024-05-06 MED ORDER — ARFORMOTEROL TARTRATE 15 MCG/2ML IN NEBU
15.0000 ug | INHALATION_SOLUTION | Freq: Two times a day (BID) | RESPIRATORY_TRACT | Status: DC
  Administered 2024-05-06 – 2024-05-08 (×4): 15 ug via RESPIRATORY_TRACT
  Filled 2024-05-06 (×4): qty 2

## 2024-05-06 MED ORDER — HYDROXYZINE HCL 25 MG PO TABS: 25.0000 mg | ORAL_TABLET | Freq: Every evening | ORAL | Status: DC | PRN

## 2024-05-06 MED ORDER — EMPAGLIFLOZIN 25 MG PO TABS
25.0000 mg | ORAL_TABLET | Freq: Every day | ORAL | Status: DC
  Administered 2024-05-06 – 2024-05-08 (×3): 25 mg via ORAL
  Filled 2024-05-06 (×3): qty 1

## 2024-05-06 MED ORDER — ALBUTEROL SULFATE (2.5 MG/3ML) 0.083% IN NEBU
5.0000 mg | INHALATION_SOLUTION | Freq: Once | RESPIRATORY_TRACT | Status: AC
  Administered 2024-05-06: 5 mg via RESPIRATORY_TRACT
  Filled 2024-05-06: qty 6

## 2024-05-06 MED ORDER — ENOXAPARIN SODIUM 40 MG/0.4ML IJ SOSY
40.0000 mg | PREFILLED_SYRINGE | INTRAMUSCULAR | Status: DC
  Administered 2024-05-06 – 2024-05-07 (×2): 40 mg via SUBCUTANEOUS
  Filled 2024-05-06 (×2): qty 0.4

## 2024-05-06 MED ORDER — SODIUM CHLORIDE 0.9 % IV SOLN
500.0000 mg | INTRAVENOUS | Status: DC
  Administered 2024-05-06 – 2024-05-07 (×2): 500 mg via INTRAVENOUS
  Filled 2024-05-06 (×2): qty 5

## 2024-05-06 MED ORDER — ONDANSETRON HCL 4 MG PO TABS: 4.0000 mg | ORAL_TABLET | Freq: Four times a day (QID) | ORAL | Status: DC | PRN

## 2024-05-06 MED ORDER — CARMEX CLASSIC LIP BALM EX OINT: TOPICAL_OINTMENT | CUTANEOUS | Status: DC | PRN

## 2024-05-06 NOTE — ED Notes (Addendum)
 Pt sats sustaining  88%-89% on baseline 2L [place]. O2 titrated to 3L [place]. O2 Sats now 93%. Will continue to monitor and titrate prn.

## 2024-05-06 NOTE — ED Provider Notes (Signed)
 [place] EMERGENCY DEPARTMENT AT [facility] Provider Note   CSN: [id number] Arrival date & time: 05/06/24  9373     Patient presents with: Shortness of Breath   Jonathan [name]. is a 88 y.o. male.   Pt complains of shortness of breath that has been worsening for the past week.  Pt called EMS for shortness of breath this am.  EMs reports 02 sat was 71%.  Pt has known lung cancer and is followed by Dr. Gatha here at [facility]. Pt reports he feels better after EMS gave him albuterol  nebs.  Pt breathing better now.  Pt denies any fever or chills. No cough or congestion.  Pt denies any flu or covid exposure.    The history is provided by the patient. No language interpreter was used.  Shortness of Breath Severity:  Moderate Onset quality:  Gradual Duration:  2 days Timing:  Constant Progression:  Worsening Chronicity:  New Relieved by:  Nothing Worsened by:  Nothing (light) Ineffective treatments:  None tried Associated symptoms: wheezing   Associated symptoms: no abdominal pain, no chest pain and no fever   Risk factors: no recent alcohol use        Prior to Admission medications   Medication Sig Start Date End Date Taking? Authorizing Provider  albuterol  (VENTOLIN  HFA) 108 (90 Base) MCG/ACT inhaler Inhale 2 puffs into the lungs every 6 (six) hours as needed for wheezing or shortness of breath. 01/28/24   Neysa Reggy BIRCH, MD  benzonatate  (TESSALON ) 200 MG capsule Take 1 capsule (200 mg total) by mouth 3 (three) times daily as needed for cough. 04/14/24   Neysa Reggy D, MD  Blood Glucose Monitoring Suppl (FREESTYLE FREEDOM LITE) w/Device KIT Use Freestyle Freedom Lite meter to check blood sugar twice daily. DX:E11.65 11/17/21   Von [place], MD  clopidogrel  (PLAVIX ) 75 MG tablet Take 1 tablet (75 mg total) by mouth daily with breakfast. 10/01/23   [name], MD  Dulaglutide  (TRULICITY ) 3 MG/0.5ML SOAJ INJECT 3 MG UNDER THE SKIN ONCE A WEEK 09/30/23   Thapa,  [place], MD  empagliflozin  (JARDIANCE ) 25 MG TABS tablet Take 1 tablet (25 mg total) by mouth daily before breakfast. For diabetes E11.65 and E 11.29 08/01/23   Thapa, [place], MD  furosemide  (LASIX ) 40 MG tablet TAKE 1 TABLET DAILY 04/27/24   Paz, Jose E, MD  gabapentin  (NEURONTIN ) 100 MG capsule Take 100 mg by mouth 2 (two) times daily. 01/02/23   [provider]  glucose blood (FREESTYLE LITE) test strip USE AS INSTRUCTED 11/25/22   Von [place], MD  hydrOXYzine  (ATARAX ) 25 MG tablet Take 1 tablet by mouth at bedtime as needed. 10/29/23   [provider]  insulin  glargine (LANTUS  SOLOSTAR) 100 UNIT/ML Solostar Pen Inject 15 Units into the skin daily. 12/03/23   Thapa, [place], MD  Insulin  Pen Needle 32G X 4 MM MISC Use 1 x a day 12/03/23   Thapa, [place], MD  metFORMIN  (GLUCOPHAGE ) 500 MG tablet Take 1 tablet (500 mg total) by mouth 2 (two) times daily with a meal. 08/01/23   Thapa, [place], MD  metoprolol  (TOPROL -XL) 200 MG 24 hr tablet Take 100 mg by mouth daily.    [provider]  Multiple Vitamin (MULTIVITAMIN WITH MINERALS) TABS tablet Take 1 tablet by mouth daily.    [provider]  OXYGEN  Inhale 2 L/min into the lungs continuous.    [provider]  sacubitril -valsartan  (ENTRESTO ) 24-26 MG Take 1 tablet by  mouth 2 (two) times daily. 02/22/23   Fairy Frames, MD  simvastatin  (ZOCOR ) 20 MG tablet Take 20 mg by mouth daily.    [provider]  sucralfate  (CARAFATE ) 1 g tablet Take 1 tablet (1 g total) by mouth 4 (four) times daily -  with meals and at bedtime. 5 min before meals for radiation induced esophagitis 11/29/23   Patrcia Cough, MD  tamsulosin  (FLOMAX ) 0.4 MG CAPS capsule Take 1 capsule (0.4 mg total) by mouth daily after supper. 10/11/23   Paz, Jose E, MD  Tiotropium Bromide -Olodaterol (STIOLTO RESPIMAT ) 2.5-2.5 MCG/ACT AERS Inhale 2 puffs into the lungs daily. 01/28/24   Neysa Reggy BIRCH, MD    Allergies: Patient has no known allergies.     Review of Systems  Constitutional:  Negative for fever.  Respiratory:  Positive for shortness of breath and wheezing.   Cardiovascular:  Negative for chest pain.  Gastrointestinal:  Negative for abdominal pain.  All other systems reviewed and are negative.   Updated Vital Signs BP 139/76   Pulse 93   Temp 97.6 F (36.4 C) (Oral)   Resp 18   SpO2 92%   Physical Exam Vitals and nursing note reviewed.  Constitutional:      Appearance: He is well-developed.  HENT:     Head: Normocephalic.  Cardiovascular:     Rate and Rhythm: Normal rate and regular rhythm.  Pulmonary:     Breath sounds: Wheezing and rhonchi present.  Abdominal:     General: There is no distension.     Palpations: Abdomen is soft.  Musculoskeletal:        General: Normal range of motion.     Cervical back: Normal range of motion.  Skin:    General: Skin is warm.  Neurological:     General: No focal deficit present.     Mental Status: He is alert and oriented to person, place, and time.  Psychiatric:        Mood and Affect: Mood normal.     (all labs ordered are listed, but only abnormal results are displayed) Labs Reviewed  COMPREHENSIVE METABOLIC PANEL WITH GFR - Abnormal; Notable for the following components:      Result Value   Glucose, Bld 144 (*)    Creatinine, Ser 1.37 (*)    GFR, Estimated 50 (*)    All other components within normal limits  TROPONIN I (HIGH SENSITIVITY) - Abnormal; Notable for the following components:   Troponin I (High Sensitivity) 22 (*)    All other components within normal limits  TROPONIN I (HIGH SENSITIVITY) - Abnormal; Notable for the following components:   Troponin I (High Sensitivity) 22 (*)    All other components within normal limits  CBC WITH DIFFERENTIAL/PLATELET    EKG: EKG Interpretation Date/Time:  Wednesday May 06 2024 06:57:21 EDT Ventricular Rate:  108 PR Interval:  193 QRS Duration:  79 QT Interval:  352 QTC Calculation: 472 R  Axis:   62  Text Interpretation: Sinus tachycardia Atrial premature complex Probable anteroseptal infarct, old no alternans seen Confirmed by Charlyn Sora ([id number]) on 05/06/2024 10:08:57 AM  Radiology: CT Chest W Contrast Addendum Date: 05/06/2024 ADDENDUM REPORT: 05/06/2024 10:11 ADDENDUM: The original report was by Dr. Ryan Salvage. The following addendum is by Dr. Ryan Salvage: These results were called by telephone at the time of interpretation on 05/06/2024 at 10:02 am to provider [facility] , who verbally acknowledged these results. Electronically Signed   By: Ryan Salvage  M.D.   On: 05/06/2024 10:11   Result Date: 05/06/2024 CLINICAL DATA:  Left upper lobe non-small cell lung cancer, hypoxia and shortness of breath. * Tracking Code: BO * EXAM: CT CHEST WITH CONTRAST TECHNIQUE: Multidetector CT imaging of the chest was performed during intravenous contrast administration. RADIATION DOSE REDUCTION: This exam was performed according to the departmental dose-optimization program which includes automated exposure control, adjustment of the mA and/or kV according to patient size and/or use of iterative reconstruction technique. CONTRAST:  75mL OMNIPAQUE  IOHEXOL  300 MG/ML  SOLN COMPARISON:  01/16/2024 FINDINGS: Cardiovascular: Coronary, aortic arch, and branch vessel atherosclerotic vascular disease. Large and substantially increased pericardial effusion. Mediastinum/Nodes: Borderline prominence of right infrahilar nodal tissue. Lungs/Pleura: Moderate left and small right pleural effusions with passive atelectasis. Centrilobular emphysema. Hazy airspace opacity in the aerated portion of the left lower lobe for example on image 79 series 6, suspicious for pneumonia or pneumonitis. Residuum of the left upper lobe mass measures proximally 4.0 by 1.5 cm, previously 4.5 by 1.9 cm on 01/16/2024. Airway plugging of the apicoposterior segment left upper lobe. Right upper lobe subpleural nodule 0.6  cm in diameter on image 82 series 6, stable. Upper Abdomen: Left adrenal nodule 2.1 by 1.7 cm, stable by my measurements, indeterminate for malignancy versus benign etiology. Musculoskeletal: Unremarkable IMPRESSION: 1. Large and substantially increased pericardial effusion compared to 01/16/2024. Correlate with physical exam indicators of tamponade in deciding whether echocardiographic workup is indicated. 2. Moderate left and small right pleural effusions with passive atelectasis. 3. Hazy airspace opacity in the aerated portion of the left lower lobe suspicious for pneumonia or pneumonitis. 4. Residuum of the left upper lobe mass measures 4.0 by 1.5 cm, previously 4.5 by 1.9 cm on 01/16/2024. 5. Airway plugging of the apicoposterior segment left upper lobe. 6. Stable 0.6 cm right upper lobe subpleural nodule. 7. Stable left adrenal nodule, indeterminate for malignancy versus benign etiology (although chronicity dating back to 05/03/2010 tends to favor benign etiology). 8. Aortic Atherosclerosis (ICD10-I70.0) and Emphysema (ICD10-J43.9). Radiology assistant personnel have been notified to put me in telephone contact with the referring physician or the referring physician's clinical representative in order to discuss these findings. Once this communication is established I will issue an addendum to this report for documentation purposes. Electronically Signed: By: Ryan Salvage M.D. On: 05/06/2024 09:52   DG Chest Port 1 View Result Date: 05/06/2024 CLINICAL DATA:  Shortness of breath. History of non-small-cell lung cancer. EXAM: PORTABLE CHEST 1 VIEW COMPARISON:  02/26/2024 FINDINGS: The cardio pericardial silhouette is enlarged. Interstitial markings are diffusely coarsened with chronic features. Hazy left parahilar opacity is similar to prior. The lungs are clear without focal pneumonia, edema, pneumothorax or pleural effusion. No acute bony abnormality. Telemetry leads overlie the chest. IMPRESSION: Stable.  Chronic interstitial coarsening without new or acute cardiopulmonary findings. Similar appearance hazy left parahilar opacity, region of patient's known lesion. Electronically Signed   By: Camellia Candle M.D.   On: 05/06/2024 07:26     Procedures   Medications Ordered in the ED  sodium chloride  flush (NS) 0.9 % injection 3 mL (3 mLs Intravenous Given 05/06/24 0718)  albuterol  (PROVENTIL ) (2.5 MG/3ML) 0.083% nebulizer solution 5 mg (5 mg Nebulization Given 05/06/24 0704)  iohexol  (OMNIPAQUE ) 300 MG/ML solution 75 mL (75 mLs Intravenous Contrast Given 05/06/24 0925)  Medical Decision Making Pt complains of shortness of breath.  Pt reports he has a histroy of LUng cancer   Amount and/or Complexity of Data Reviewed Independent Historian: EMS    Details: EMS provides history the patient's O2 sat was 71% External Data Reviewed: notes.    Details: Oncology notes reviewed Labs: ordered. Decision-making details documented in ED Course.    Details: Labs ordered reviewed and interpreted  Radiology: ordered and independent interpretation performed. Decision-making details documented in ED Course.    Details: Chest x-ray shows hazy opacity perihilar CT chest shows large and substantially increased pericardial effusion, bilateral pleural effusion, lung mass and emphysema. ECG/medicine tests: ordered and independent interpretation performed. Decision-making details documented in ED Course.    Details: EKG sinus rhythm no acute abnormality Discussion of management or test interpretation with external provider(s): I discussed the patient with Dr. Wadie cardiology cardiology will see here in consult I discussed the patient with hospitalist Dr. Celinda who will see here for admission.  Risk Prescription drug management. Decision regarding hospitalization.        Final diagnoses:  Pericardial effusion  Malignant pleural effusion  SOB (shortness of breath)    ED  Discharge Orders     None          Fl[place] Sonny MARLA DEVONNA 05/06/24 1253    Charlyn Sora, MD 05/07/24 1541

## 2024-05-06 NOTE — Plan of Care (Signed)
  Problem: Education: Goal: Knowledge of General Education information will improve Description: Including pain rating scale, medication(s)/side effects and non-pharmacologic comfort measures Outcome: Progressing   Problem: Health Behavior/Discharge Planning: Goal: Ability to manage health-related needs will improve Outcome: Progressing   Problem: Clinical Measurements: Goal: Ability to maintain clinical measurements within normal limits will improve Outcome: Progressing Goal: Will remain free from infection Outcome: Progressing Goal: Diagnostic test results will improve Outcome: Progressing Goal: Respiratory complications will improve Outcome: Progressing Goal: Cardiovascular complication will be avoided Outcome: Progressing   Problem: Activity: Goal: Risk for activity intolerance will decrease Outcome: Progressing   Problem: Nutrition: Goal: Adequate nutrition will be maintained Outcome: Progressing   Problem: Coping: Goal: Level of anxiety will decrease Outcome: Progressing   Problem: Elimination: Goal: Will not experience complications related to bowel motility Outcome: Progressing Goal: Will not experience complications related to urinary retention Outcome: Progressing   Problem: Pain Managment: Goal: General experience of comfort will improve and/or be controlled Outcome: Progressing   Problem: Safety: Goal: Ability to remain free from injury will improve Outcome: Progressing   Problem: Skin Integrity: Goal: Risk for impaired skin integrity will decrease Outcome: Progressing   Problem: Education: Goal: Ability to describe self-care measures that may prevent or decrease complications (Diabetes Survival Skills Education) will improve Outcome: Progressing Goal: Individualized Educational Video(s) Outcome: Progressing   Problem: Coping: Goal: Ability to adjust to condition or change in health will improve Outcome: Progressing   Problem: Fluid  Volume: Goal: Ability to maintain a balanced intake and output will improve Outcome: Progressing   Problem: Health Behavior/Discharge Planning: Goal: Ability to identify and utilize available resources and services will improve Outcome: Progressing Goal: Ability to manage health-related needs will improve Outcome: Progressing   Problem: Metabolic: Goal: Ability to maintain appropriate glucose levels will improve Outcome: Progressing   Problem: Nutritional: Goal: Maintenance of adequate nutrition will improve Outcome: Progressing Goal: Progress toward achieving an optimal weight will improve Outcome: Progressing   Problem: Skin Integrity: Goal: Risk for impaired skin integrity will decrease Outcome: Progressing   Problem: Tissue Perfusion: Goal: Adequacy of tissue perfusion will improve Outcome: Progressing   Problem: Education: Goal: Knowledge of disease or condition will improve Outcome: Progressing Goal: Knowledge of the prescribed therapeutic regimen will improve Outcome: Progressing Goal: Individualized Educational Video(s) Outcome: Progressing   Problem: Activity: Goal: Ability to tolerate increased activity will improve Outcome: Progressing Goal: Will verbalize the importance of balancing activity with adequate rest periods Outcome: Progressing   Problem: Respiratory: Goal: Ability to maintain a clear airway will improve Outcome: Progressing Goal: Levels of oxygenation will improve Outcome: Progressing Goal: Ability to maintain adequate ventilation will improve Outcome: Progressing   Problem: Activity: Goal: Ability to tolerate increased activity will improve Outcome: Progressing   Problem: Clinical Measurements: Goal: Ability to maintain a body temperature in the normal range will improve Outcome: Progressing   Problem: Respiratory: Goal: Ability to maintain adequate ventilation will improve Outcome: Progressing Goal: Ability to maintain a clear  airway will improve Outcome: Progressing

## 2024-05-06 NOTE — Progress Notes (Signed)
 Reviewed patients echo Small pericardial effusion no tamponade However his EF is significantly reduced 20-25% compared To echo done January read as normal.  Also has mild AR/MR He has CAD with prior stenting of RCA/LAD but no angina Negative troponin and no acute ECG changes. Stents were  Widely patent during cath June 2024 Suspect this represents chemoRx induced cardiotoxicity as he Has received carboplatin  and paclitaxel   Will optimized medical Rx orders written Diurese with IV lasix  Follow K/Cr Given his co morbidities do not think right / left heart cath indicated At this time  Maude Emmer MD FACC

## 2024-05-06 NOTE — ED Triage Notes (Signed)
 Patient BIB [facility] from home  for shortness of breath, found with O2 sat of 77% on two liters at home.  Patient is lung CA patient and has been receiving chemo.  Patient presented with bilateral wheezes, received 2 duonebs and now lungs are clear

## 2024-05-06 NOTE — H&P (Signed)
 History and Physical    Patient: Jonathan [name]. FMW:[id number] DOB: 02-20-1936 DOA: 05/06/2024 DOS: the patient was seen and examined on 05/06/2024 PCP: [name], MD  Patient coming from: Home  Chief Complaint:  Chief Complaint  Patient presents with   Shortness of Breath   HPI: Jonathan [name]. is a 88 y.o. male with medical history significant of anxiety, depression, CAD, chronic combined systolic and diastolic heart failure, history of coronary stents, colon polyps, type 2 diabetes, hyperlipidemia, hypertension, hypogonadism, peripheral vascular disease, COPD, chronic respiratory failure with hypoxia on home oxygen  at 2 LPM who presented to the emergency department complaints of dyspnea and hypoxia at home.  He has also had wheezing and an occasional nonproductive cough. He denied fever, chills, rhinorrhea, sore throat or hemoptysis.  No chest pain, palpitations, diaphoresis, PND, orthopnea or recent pitting edema of the lower extremities.  No abdominal pain, nausea, emesis, diarrhea, constipation, melena or hematochezia.  No flank pain, dysuria, frequency or hematuria.  No polyuria, polydipsia, polyphagia or blurred vision.   Lab work: CBC was normal.  Troponin was 22 ng/L twice.  CMP showed a glucose of 144 and creatinine of 1.37 mg/dL, the rest of the CMP measurements were normal.  Phosphorus 3.5 and magnesium  2.4 mg/dL.  Imaging: Portable 1 view chest radiograph showing chronic interstitial coarsening without any new or acute cardiopulmonary findings.  Similar appearance of hazy left perihilar opacity, region of patient's known lesion.  CT chest with contrast showed large and substantially increased pericardial effusion compared to 01/16/2024.  Moderate left and small right pleural effusions with passive atelectasis.  Hazy airspace opacity in the aerated portion of the left lower lobe suspicious for pneumonia or pneumonitis.  Received in a left upper lobe mass which has decreased in  size since 01/16/2024.  Airway plugging of the apical posterior segment left upper lobe.  Stable 0.6 cm right upper lobe subpleural nodule.  Stable left adrenal nodule, indeterminate for malignancy versus benign etiology although chronicity dates to August 2011 favoring benign etiology.  Aortic atherosclerosis.  Emphysema.   ED course: Initial vital signs were temperature 97.6 F, pulse 113, respiration 20, BP 171/103 mmHg O2 sat 85% on nasal cannula oxygen  at 2 LPM.  The patient received a 5 mg albuterol  neb.  Cardiology was consulted.  Stat echocardiogram ordered.  About 3 hours after arrival to the medical unit, the patient was given a DuoNeb, magnesium  sulfate 2 g IVPB and Solu-Medrol  125 mg IVP x 1.  Review of Systems: As mentioned in the history of present illness. All other systems reviewed and are negative. Past Medical History:  Diagnosis Date   CAD (coronary artery disease)    Two RCA stents remotely / 3rd RCA stent 2006   Colon polyps    s/p several Cscopes.   COPD (chronic obstructive pulmonary disease) (HCC)    on O2, nocturnal   Diabetes mellitus    dx aprox 2009   ED (erectile dysfunction)    has a vacumm device   Ejection fraction    Hyperlipidemia    dx in 90s   Hypertension    dx in the 101   Hypogonadism male    PVD (peripheral vascular disease) (HCC)    s/p stents at LE 2009, Dr Ladona   Shortness of breath    O2 Sat dropped to 82% walking on the treadmill, September, 2012   Past Surgical History:  Procedure Laterality Date   APPENDECTOMY     BRONCHIAL  BIOPSY  10/01/2023   Procedure: BRONCHIAL BIOPSIES;  Surgeon: Brenna Adine CROME, DO;  Location: [facility];  Service: Pulmonary;;   BRONCHIAL BRUSHINGS  10/01/2023   Procedure: BRONCHIAL BRUSHINGS;  Surgeon: Brenna Adine CROME, DO;  Location: [facility];  Service: Pulmonary;;   BRONCHIAL WASHINGS  10/01/2023   Procedure: BRONCHIAL WASHINGS;  Surgeon: Brenna Adine CROME, DO;  Location: [facility];  Service:  Pulmonary;;   CORONARY STENT INTERVENTION N/A 01/12/2022   Procedure: CORONARY STENT INTERVENTION;  Surgeon: Swazil[place], Peter M, MD;  Location: [facility];  Service: Cardiovascular;  Laterality: N/A;   HEMOSTASIS CONTROL  10/01/2023   Procedure: HEMOSTASIS CONTROL;  Surgeon: Brenna Adine CROME, DO;  Location: [facility];  Service: Pulmonary;;   LEFT HEART CATH AND CORONARY ANGIOGRAPHY N/A 03/13/2023   Procedure: LEFT HEART CATH AND CORONARY ANGIOGRAPHY;  Surgeon: [place], Peter M, MD;  Location: [facility] CV [facility];  Service: Cardiovascular;  Laterality: N/A;   RIGHT/LEFT HEART CATH AND CORONARY ANGIOGRAPHY N/A 01/11/2022   Procedure: RIGHT/LEFT HEART CATH AND CORONARY ANGIOGRAPHY;  Surgeon: Claudene Victory ORN, MD;  Location: [facility];  Service: Cardiovascular;  Laterality: N/A;   TONSILLECTOMY     VIDEO BRONCHOSCOPY  10/01/2023   Procedure: VIDEO BRONCHOSCOPY WITHOUT FLUORO;  Surgeon: Brenna Adine CROME, DO;  Location: [facility];  Service: Pulmonary;;   Social History:  reports that he quit smoking about 46 years ago. His smoking use included cigarettes. He started smoking about 76 years ago. He has a 60 pack-year smoking history. He has never used smokeless tobacco. He reports that he does not currently use alcohol. He reports that he does not use drugs.  No Known Allergies  Family History  Problem Relation Age of Onset   Heart disease Father    Hypertension Father    Stroke Father    Diabetes Paternal Aunt    Diabetes Maternal Grandmother    Diabetes Other        GM, nephews, many family members   Hyperlipidemia Other        ?   Prostate cancer Brother    Colon cancer Neg Hx     Prior to Admission medications   Medication Sig Start Date End Date Taking? Authorizing Provider  albuterol  (VENTOLIN  HFA) 108 (90 Base) MCG/ACT inhaler Inhale 2 puffs into the lungs every 6 (six) hours as needed for wheezing or shortness of breath. 01/28/24   Neysa Reggy BIRCH, MD  benzonatate   (TESSALON ) 200 MG capsule Take 1 capsule (200 mg total) by mouth 3 (three) times daily as needed for cough. 04/14/24   Neysa Reggy D, MD  Blood Glucose Monitoring Suppl (FREESTYLE FREEDOM LITE) w/Device KIT Use Freestyle Freedom Lite meter to check blood sugar twice daily. DX:E11.65 11/17/21   Von [place], MD  clopidogrel  (PLAVIX ) 75 MG tablet Take 1 tablet (75 mg total) by mouth daily with breakfast. 10/01/23   [name], MD  Dulaglutide  (TRULICITY ) 3 MG/0.5ML SOAJ INJECT 3 MG UNDER THE SKIN ONCE A WEEK 09/30/23   Thapa, [place], MD  empagliflozin  (JARDIANCE ) 25 MG TABS tablet Take 1 tablet (25 mg total) by mouth daily before breakfast. For diabetes E11.65 and E 11.29 08/01/23   Thapa, [place], MD  furosemide  (LASIX ) 40 MG tablet TAKE 1 TABLET DAILY 04/27/24   Paz, Jose E, MD  gabapentin  (NEURONTIN ) 100 MG capsule Take 100 mg by mouth 2 (two) times daily. 01/02/23   [provider]  glucose blood (FREESTYLE LITE) test strip USE AS  INSTRUCTED 11/25/22   Von [place], MD  hydrOXYzine  (ATARAX ) 25 MG tablet Take 1 tablet by mouth at bedtime as needed. 10/29/23   [provider]  insulin  glargine (LANTUS  SOLOSTAR) 100 UNIT/ML Solostar Pen Inject 15 Units into the skin daily. 12/03/23   Thapa, [place], MD  Insulin  Pen Needle 32G X 4 MM MISC Use 1 x a day 12/03/23   Thapa, [place], MD  metFORMIN  (GLUCOPHAGE ) 500 MG tablet Take 1 tablet (500 mg total) by mouth 2 (two) times daily with a meal. 08/01/23   Thapa, [place], MD  metoprolol  (TOPROL -XL) 200 MG 24 hr tablet Take 100 mg by mouth daily.    [provider]  Multiple Vitamin (MULTIVITAMIN WITH MINERALS) TABS tablet Take 1 tablet by mouth daily.    [provider]  OXYGEN  Inhale 2 L/min into the lungs continuous.    [provider]  sacubitril -valsartan  (ENTRESTO ) 24-26 MG Take 1 tablet by mouth 2 (two) times daily. 02/22/23   Fairy Frames, MD  simvastatin  (ZOCOR ) 20 MG tablet Take 20 mg by mouth daily.    [provider]  sucralfate  (CARAFATE ) 1 g tablet Take 1 tablet (1 g total) by mouth 4 (four) times daily -  with meals and at bedtime. 5 min before meals for radiation induced esophagitis 11/29/23   Patrcia Cough, MD  tamsulosin  (FLOMAX ) 0.4 MG CAPS capsule Take 1 capsule (0.4 mg total) by mouth daily after supper. 10/11/23   Paz, Jose E, MD  Tiotropium Bromide -Olodaterol (STIOLTO RESPIMAT ) 2.5-2.5 MCG/ACT AERS Inhale 2 puffs into the lungs daily. 01/28/24   Neysa Rama D, MD    Physical Exam: Vitals:   05/06/24 1045 05/06/24 1050 05/06/24 1100 05/06/24 1103  BP: 120/74  125/81   Pulse: 90 88 90 94  Resp: 18 17 19 16   Temp:      TempSrc:      SpO2: 92% 93% 91% 96%   Physical Exam Vitals and nursing note reviewed.  Constitutional:      General: He is awake. He is not in acute distress.    Appearance: He is ill-appearing.  HENT:     Head: Normocephalic.     Nose: No rhinorrhea.     Mouth/Throat:     Mouth: Mucous membranes are dry.  Eyes:     General: No scleral icterus.    Pupils: Pupils are equal, round, and reactive to light.  Neck:     Vascular: No JVD.  Cardiovascular:     Rate and Rhythm: Normal rate and regular rhythm.     Heart sounds: S1 normal and S2 normal.  Pulmonary:     Effort: Pulmonary effort is normal. No accessory muscle usage or respiratory distress.     Breath sounds: Wheezing present. No rhonchi or rales.  Abdominal:     General: Bowel sounds are normal. There is no distension.     Palpations: Abdomen is soft.     Tenderness: There is no abdominal tenderness. There is no right CVA tenderness or left CVA tenderness.  Musculoskeletal:     Cervical back: Neck supple.     Right lower leg: No edema.     Left lower leg: No edema.  Skin:    General: Skin is warm and dry.  Neurological:     General: No focal deficit present.     Mental Status: He is alert and oriented to person, place, and time.  Psychiatric:        Mood and Affect: Mood normal.  Behavior: Behavior normal. Behavior is cooperative.     Data Reviewed:  Results are pending, will review when available. Echocardiogram done today. IMPRESSIONS:   1. Left ventricular ejection fraction, by estimation, is 20 to 25%. The left ventricle has severely decreased function. The left ventricle demonstrates global hypokinesis. There is moderate concentric left ventricular hypertrophy. Left ventricular diastolic parameters are indeterminate.  2. Right ventricular systolic function is normal. The right ventricular size is normal.  3. Left atrial size was mildly dilated.  4. There is a small circumfrential pericardial effusion with a moderate focal collection abutting the RA. There is mild invagination of the RA but no overt tamponade. a small pericardial effusion is present. The pericardial effusion is circumferential and localized near the right atrium. There is no evidence of cardiac tamponade.  5. The mitral valve is normal in structure. Trivial mitral valve regurgitation. No evidence of mitral stenosis.  6. The aortic valve is tricuspid. There is mild calcification of the aortic valve. Aortic valve regurgitation is mild. Aortic valve sclerosis/calcification is present, without any evidence of aortic stenosis.  7. Aortic dilatation noted. There is borderline dilatation of the aortic root, measuring 38 mm. There is borderline dilatation of the ascending aorta, measuring 38 mm.  8. The inferior vena cava is dilated in size with >50% respiratory variability, suggesting right atrial pressure of 8 mmHg.   Assessment and Plan: Principal Problem:   Acute on chronic respiratory failure with hypoxia (HCC) In the setting of:     COPD with acute exacerbation (HCC) And:    Acute on chronic combined systolic  and diastolic CHF (congestive heart failure) (HCC) With associated:   Pericardial effusion And:   Pleural effusion Observation/telemetry. Continue supplemental  oxygen . Methylprednisolone  125 mg IVP x1. Followed by prednisone  40 mg p.o. daily in a.m. Scheduled and as needed bronchodilators. Sodium and fluid restriction. Continue furosemide  40 mg IVP twice daily. Continue Entresto  24-26 mg p.o. twice daily. Continue metoprolol  100 mg p.o. twice daily. Monitor daily weights, intake and output. Monitor renal function and electrolytes. Echocardiogram moderate Cardiology consult appreciated.  Active Problems:   Abnormal chest CT  Questionable pneumonia versus pneumonitis. Will cover with CAP antibiotics. Obtain procalcitonin today and tomorrow.    CAD (coronary artery disease)   PVD (peripheral vascular disease) (HCC) Continue beta-blocker, clopidogrel  and statin.    Essential hypertension  Continue furosemide  Entresto  and metoprolol  as above.    CKD stage 3b, GFR 30-44 ml/min (HCC) Normal renal function electrolytes.    Hyperlipidemia Continue simvastatin  20 mg p.o. daily.    Anxiety and depression Continue hydroxyzine  at bedtime. Patient also on tamsulosin , but no notes from urology. No BPH or another prostate related diagnosis in problem list.    Squamous cell carcinoma of upper lobe of left lung (HCC) Mass decreasing in size. Follow-up with oncology as scheduled.     Advance Care Planning:   Code Status: Full Code   Consults:   Family Communication:   Severity of Illness: The appropriate patient status for this patient is OBSERVATION. Observation status is judged to be reasonable and necessary in order to provide the required intensity of service to ensure the patient's safety. The patient's presenting symptoms, physical exam findings, and initial radiographic and laboratory data in the context of their medical condition is felt to place them at decreased risk for further clinical deterioration. Furthermore, it is anticipated that the patient will be medically stable for discharge from the hospital within 2 midnights of  admission.  Author: Alm Dorn Castor, MD 05/06/2024 11:55 AM  For on call review [email or web address].   This document was prepared using Dragon voice recognition software and may contain some unintended transcription errors.

## 2024-05-06 NOTE — Progress Notes (Signed)
 Patient complained of SOB, SPO2 sats assessed and it read 87. Initially on 2 L of O2, bumped up to 4L, Dr Celinda informed, Neb treatment and Solu-medrol  ordered and administered. SPO2 assessed 10 minutes later and it read 96. Patient continues to complain of difficulty in breathing, unable to eat his lunch, and increased WOB evident, RR called , patient assessed , CXR ordered , Dr Celinda made aware and en route to further assess patient. Will continue to follow.

## 2024-05-06 NOTE — Plan of Care (Signed)

## 2024-05-06 NOTE — Consult Note (Signed)
 CARDIOLOGY CONSULT NOTE       Patient ID: Jonathan [name]. MRN: [id number] DOB/AGE: Nov 07, 1935 88 y.o.  Admit date: 05/06/2024 Referring Physician: [facility] Primary Physician: [name], MD Primary Cardiologist: Court  Reason for Consultation: Pericardial Effusion    HPI:  88 y.o. seen in ED for dyspnea. He has stage 3 non small cell lung cancer, COPD on home oxygen . Distant history of CAD with stent to RCA and LAD 01/12/22 Stents widely patent on repeat cath 03/13/23. TTE 09/20/23 EF 55-60% Normal RV mild MR and mild AR He has CT scan in may with small pericardial effusion. CT done in ER this admission read by radiology as large. To my eye it is moderate not circumferential mostly over the RV.  He has had more acute dyspnea since last night. No sputum , fever or excess coughing. He has lower extremity edema but no leg pain. He takes care of his wife with dementia. He is a Programmer, multimedia. Son from [place] watching his wife now  ROS All other systems reviewed and negative except as noted above  Past Medical History:  Diagnosis Date   CAD (coronary artery disease)    Two RCA stents remotely / 3rd RCA stent 2006   Colon polyps    s/p several Cscopes.   COPD (chronic obstructive pulmonary disease) (HCC)    on O2, nocturnal   Diabetes mellitus    dx aprox 2009   ED (erectile dysfunction)    has a vacumm device   Ejection fraction    Hyperlipidemia    dx in 90s   Hypertension    dx in the 67   Hypogonadism male    PVD (peripheral vascular disease) (HCC)    s/p stents at LE 2009, Dr Ladona   Shortness of breath    O2 Sat dropped to 82% walking on the treadmill, September, 2012    Family History  Problem Relation Age of Onset   Heart disease Father    Hypertension Father    Stroke Father    Diabetes Paternal Aunt    Diabetes Maternal Grandmother    Diabetes Other        GM, nephews, many family members   Hyperlipidemia Other        ?   Prostate cancer Brother    Colon cancer  Neg Hx     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   Tobacco Use   Smoking status: Former    Current packs/day: 0.00    Average packs/day: 2.0 packs/day for 30.0 years (60.0 ttl pk-yrs)    Types: Cigarettes    Start date: 06/18/1947    Quit date: 06/17/1977    Years since quitting: 46.9   Smokeless tobacco: Never   Tobacco comments:    2 ppd, quit 1978  Vaping Use   Vaping status: Never Used  Substance and Sexual Activity   Alcohol use: Not Currently   Drug use: No   Sexual activity: Yes  Other Topics Concern   Not on file  Social History Narrative   4 children (lost 1 son)   Wife has 2 children  Social Drivers of Corporate investment banker Strain: Low Risk  (12/17/2023)   Overall Financial Resource Strain (CARDIA)    Difficulty of Paying Living Expenses: Not hard at all  Food Insecurity: No Food Insecurity (12/17/2023)   Hunger Vital Sign    Worried About Running Out of Food in the Last Year: Never true    Ran Out of Food in the Last Year: Never true  Transportation Needs: No Transportation Needs (12/17/2023)   PRAPARE - Administrator, Civil Service (Medical): No    Lack of Transportation (Non-Medical): No  Physical Activity: Inactive (12/17/2023)   Exercise Vital Sign    Days of Exercise per Week: 0 days    Minutes of Exercise per Session: 0 min  Stress: No Stress Concern Present (12/17/2023)   [employer] of Occupational Health - Occupational Stress Questionnaire    Feeling of Stress : Not at all  Social Connections: Socially Integrated (12/17/2023)   Social Connection and Isolation Panel    Frequency of Communication with Friends and Family: More than three times a week    Frequency of Social Gatherings  with Friends and Family: More than three times a week    Attends Religious Services: More than 4 times per year    Active Member of Clubs or Organizations: Yes    Attends Banker Meetings: More than 4 times per year    Marital Status: Married  Catering manager Violence: Not At Risk (12/17/2023)   Humiliation, Afraid, Rape, and Kick questionnaire    Fear of Current or Ex-Partner: No    Emotionally Abused: No    Physically Abused: No    Sexually Abused: No    Past Surgical History:  Procedure Laterality Date   APPENDECTOMY     BRONCHIAL BIOPSY  10/01/2023   Procedure: BRONCHIAL BIOPSIES;  Surgeon: Brenna Adine CROME, DO;  Location: [facility];  Service: Pulmonary;;   BRONCHIAL BRUSHINGS  10/01/2023   Procedure: BRONCHIAL BRUSHINGS;  Surgeon: Brenna Adine CROME, DO;  Location: [facility];  Service: Pulmonary;;   BRONCHIAL WASHINGS  10/01/2023   Procedure: BRONCHIAL WASHINGS;  Surgeon: Brenna Adine CROME, DO;  Location: [facility];  Service: Pulmonary;;   CORONARY STENT INTERVENTION N/A 01/12/2022   Procedure: CORONARY STENT INTERVENTION;  Surgeon: [place], Nai Borromeo M, MD;  Location: [facility];  Service: Cardiovascular;  Laterality: N/A;   HEMOSTASIS CONTROL  10/01/2023   Procedure: HEMOSTASIS CONTROL;  Surgeon: Brenna Adine CROME, DO;  Location: [facility];  Service: Pulmonary;;   LEFT HEART CATH AND CORONARY ANGIOGRAPHY N/A 03/13/2023   Procedure: LEFT HEART CATH AND CORONARY ANGIOGRAPHY;  Surgeon: [place], Christin Mccreedy M, MD;  Location[facility] CV [facility];  Service: Cardiovascular;  Laterality: N/A;   RIGHT/LEFT HEART CATH AND CORONARY ANGIOGRAPHY N/A 01/11/2022   Procedure: RIGHT/LEFT HEART CATH AND CORONARY ANGIOGRAPHY;  Surgeon: Claudene Victory ORN, MD;  Location: [facility];  Service: Cardiovascular;  Laterality: N/A;   TONSILLECTOMY     VIDEO BRONCHOSCOPY  10/01/2023   Procedure: VIDEO BRONCHOSCOPY WITHOUT FLUORO;  Surgeon: Brenna Adine CROME, DO;  Location: [facility];   Service: Pulmonary;;     No current facility-administered medications for this encounter.  Current Outpatient Medications:    albuterol  (VENTOLIN  HFA) 108 (90 Base) MCG/ACT inhaler, Inhale 2 puffs into the lungs every 6 (six) hours as needed for wheezing or shortness of breath., Disp: 54 g, Rfl: 3   benzonatate  (TESSALON ) 200 MG capsule, Take 1 capsule (200 mg total)  by mouth 3 (three) times daily as needed for cough., Disp: 30 capsule, Rfl: 4   Blood Glucose Monitoring Suppl (FREESTYLE FREEDOM LITE) w/Device KIT, Use Freestyle Freedom Lite meter to check blood sugar twice daily. DX:E11.65, Disp: 1 kit, Rfl: 0   clopidogrel  (PLAVIX ) 75 MG tablet, Take 1 tablet (75 mg total) by mouth daily with breakfast., Disp: 90 tablet, Rfl: 1   Dulaglutide  (TRULICITY ) 3 MG/0.5ML SOAJ, INJECT 3 MG UNDER THE SKIN ONCE A WEEK, Disp: 6 mL, Rfl: 3   empagliflozin  (JARDIANCE ) 25 MG TABS tablet, Take 1 tablet (25 mg total) by mouth daily before breakfast. For diabetes E11.65 and E 11.29, Disp: 90 tablet, Rfl: 3   furosemide  (LASIX ) 40 MG tablet, TAKE 1 TABLET DAILY, Disp: 90 tablet, Rfl: 3   gabapentin  (NEURONTIN ) 100 MG capsule, Take 100 mg by mouth 2 (two) times daily., Disp: , Rfl:    glucose blood (FREESTYLE LITE) test strip, USE AS INSTRUCTED, Disp: 100 strip, Rfl: 11   hydrOXYzine  (ATARAX ) 25 MG tablet, Take 1 tablet by mouth at bedtime as needed., Disp: , Rfl:    insulin  glargine (LANTUS  SOLOSTAR) 100 UNIT/ML Solostar Pen, Inject 15 Units into the skin daily., Disp: 15 mL, Rfl: 11   Insulin  Pen Needle 32G X 4 MM MISC, Use 1 x a day, Disp: 100 each, Rfl: 3   metFORMIN  (GLUCOPHAGE ) 500 MG tablet, Take 1 tablet (500 mg total) by mouth 2 (two) times daily with a meal., Disp: 180 tablet, Rfl: 3   metoprolol  (TOPROL -XL) 200 MG 24 hr tablet, Take 100 mg by mouth daily., Disp: , Rfl:    Multiple Vitamin (MULTIVITAMIN WITH MINERALS) TABS tablet, Take 1 tablet by mouth daily., Disp: , Rfl:    OXYGEN , Inhale 2 L/min  into the lungs continuous., Disp: , Rfl:    sacubitril -valsartan  (ENTRESTO ) 24-26 MG, Take 1 tablet by mouth 2 (two) times daily., Disp: 60 tablet, Rfl: 0   simvastatin  (ZOCOR ) 20 MG tablet, Take 20 mg by mouth daily., Disp: , Rfl:    sucralfate  (CARAFATE ) 1 g tablet, Take 1 tablet (1 g total) by mouth 4 (four) times daily -  with meals and at bedtime. 5 min before meals for radiation induced esophagitis, Disp: 120 tablet, Rfl: 2   tamsulosin  (FLOMAX ) 0.4 MG CAPS capsule, Take 1 capsule (0.4 mg total) by mouth daily after supper., Disp: 90 capsule, Rfl: 1   Tiotropium Bromide -Olodaterol (STIOLTO RESPIMAT ) 2.5-2.5 MCG/ACT AERS, Inhale 2 puffs into the lungs daily., Disp: 12 g, Rfl: 4    Physical Exam: Blood pressure 125/81, pulse 94, temperature 97.6 F (36.4 C), temperature source Oral, resp. rate 16, SpO2 96%.    No distress No cervical nodes Diffuse insp/expitory wheezing bilateral JVP mildly elevated negative Kussmaul sign No murmur Abdomen benign Bilateral plus 1 edema right > left   Labs:   Lab Results  Component Value Date   WBC 7.9 05/06/2024   HGB 16.0 05/06/2024   HCT 48.8 05/06/2024   MCV 98.4 05/06/2024   PLT 208 05/06/2024    Recent Labs  Lab 05/06/24 0647  NA 137  K 3.6  CL 101  CO2 23  BUN 23  CREATININE 1.37*  CALCIUM 8.9  PROT 7.5  BILITOT 0.9  ALKPHOS 71  ALT 15  AST 22  GLUCOSE 144*   Lab Results  Component Value Date   TROPONINI <0.03 03/05/2019    Lab Results  Component Value Date   CHOL 109 01/01/2024   CHOL 108 04/01/2023  CHOL 117 01/12/2022   Lab Results  Component Value Date   HDL 41 01/01/2024   HDL 43.40 04/01/2023   HDL 36 (L) 01/12/2022   Lab Results  Component Value Date   LDLCALC 60 01/01/2024   LDLCALC 48 04/01/2023   LDLCALC 65 01/12/2022   Lab Results  Component Value Date   TRIG 69 01/01/2024   TRIG 83.0 04/01/2023   TRIG 81 01/12/2022   Lab Results  Component Value Date   CHOLHDL 2 04/01/2023   CHOLHDL  3.3 01/12/2022   CHOLHDL 3 06/27/2021   Lab Results  Component Value Date   LDLDIRECT 55.0 01/01/2018      Radiology: CT Chest W Contrast Addendum Date: 05/06/2024 ADDENDUM REPORT: 05/06/2024 10:11 ADDENDUM: The original report was by Dr. Ryan Salvage. The following addendum is by Dr. Ryan Salvage: These results were called by telephone at the time of interpretation on 05/06/2024 at 10:02 am to provider [facility] , who verbally acknowledged these results. Electronically Signed   By: Ryan Salvage M.D.   On: 05/06/2024 10:11   Result Date: 05/06/2024 CLINICAL DATA:  Left upper lobe non-small cell lung cancer, hypoxia and shortness of breath. * Tracking Code: BO * EXAM: CT CHEST WITH CONTRAST TECHNIQUE: Multidetector CT imaging of the chest was performed during intravenous contrast administration. RADIATION DOSE REDUCTION: This exam was performed according to the departmental dose-optimization program which includes automated exposure control, adjustment of the mA and/or kV according to patient size and/or use of iterative reconstruction technique. CONTRAST:  75mL OMNIPAQUE  IOHEXOL  300 MG/ML  SOLN COMPARISON:  01/16/2024 FINDINGS: Cardiovascular: Coronary, aortic arch, and branch vessel atherosclerotic vascular disease. Large and substantially increased pericardial effusion. Mediastinum/Nodes: Borderline prominence of right infrahilar nodal tissue. Lungs/Pleura: Moderate left and small right pleural effusions with passive atelectasis. Centrilobular emphysema. Hazy airspace opacity in the aerated portion of the left lower lobe for example on image 79 series 6, suspicious for pneumonia or pneumonitis. Residuum of the left upper lobe mass measures proximally 4.0 by 1.5 cm, previously 4.5 by 1.9 cm on 01/16/2024. Airway plugging of the apicoposterior segment left upper lobe. Right upper lobe subpleural nodule 0.6 cm in diameter on image 82 series 6, stable. Upper Abdomen: Left adrenal nodule  2.1 by 1.7 cm, stable by my measurements, indeterminate for malignancy versus benign etiology. Musculoskeletal: Unremarkable IMPRESSION: 1. Large and substantially increased pericardial effusion compared to 01/16/2024. Correlate with physical exam indicators of tamponade in deciding whether echocardiographic workup is indicated. 2. Moderate left and small right pleural effusions with passive atelectasis. 3. Hazy airspace opacity in the aerated portion of the left lower lobe suspicious for pneumonia or pneumonitis. 4. Residuum of the left upper lobe mass measures 4.0 by 1.5 cm, previously 4.5 by 1.9 cm on 01/16/2024. 5. Airway plugging of the apicoposterior segment left upper lobe. 6. Stable 0.6 cm right upper lobe subpleural nodule. 7. Stable left adrenal nodule, indeterminate for malignancy versus benign etiology (although chronicity dating back to 05/03/2010 tends to favor benign etiology). 8. Aortic Atherosclerosis (ICD10-I70.0) and Emphysema (ICD10-J43.9). Radiology assistant personnel have been notified to put me in telephone contact with the referring physician or the referring physician's clinical representative in order to discuss these findings. Once this communication is established I will issue an addendum to this report for documentation purposes. Electronically Signed: By: Ryan Salvage M.D. On: 05/06/2024 09:52   DG Chest Port 1 View Result Date: 05/06/2024 CLINICAL DATA:  Shortness of breath. History of non-small-cell lung cancer. EXAM:  PORTABLE CHEST 1 VIEW COMPARISON:  02/26/2024 FINDINGS: The cardio pericardial silhouette is enlarged. Interstitial markings are diffusely coarsened with chronic features. Hazy left parahilar opacity is similar to prior. The lungs are clear without focal pneumonia, edema, pneumothorax or pleural effusion. No acute bony abnormality. Telemetry leads overlie the chest. IMPRESSION: Stable. Chronic interstitial coarsening without new or acute cardiopulmonary findings.  Similar appearance hazy left parahilar opacity, region of patient's known lesion. Electronically Signed   By: Camellia Candle M.D.   On: 05/06/2024 07:26    EKG: ST rate 108   ASSESSMENT AND PLAN:   Pericardial effusion:  looks more moderate over RV to me Have contacted tech to do TTE in ER. Clinically he is not in tamponade with stable BP and negative Kussmaul sign. Would be concern for malignant effusion given mediastinal involvement of his lung cancer Lung Cancer:  post chemo/radiation. On consolidative immunoRx monthly LUL mass persists on CT ? Pneumonia as well per primary service CAD:  2023 had stent to RCA/LAD Patent on cath 2024 No angina ECG non acute other than tachycardia Troponin essentially negative 22   Signed: Maude Emmer 05/06/2024, 11:05 AM

## 2024-05-06 NOTE — Progress Notes (Addendum)
 ADM: PERICARDIAL EFFUSIONS HOME REGIMEN: STIOLO RESPIMET 2 PUFFS every day, ALBUTEROL  INALER 2 PUFFS Q6 PRN, O2/ 2LPM [place] CURRENT THERAPY: DUONEB QID, BROVANA  BID, INCRUSE every day, O2  PLAN: ALBUTEROL  BID, BROVANA  BID, INCRUSE every day, O2 MEDICAL HX: LUNG CANCER, COPD ASSESSMENT: NO WOB, NO SOB, SOME COUGH, VITALS NORMAL, PT CURRENT ON 2 LPM [facility], BS/DIM X-RAY: 05/06/24 STABLE RT CHANGED THERAPY BASED ON PT CONDITION AND X-RAY. RT D/C DUONEB DUE TO THE FACT PT ALREADY GETTING ANTICHOLINERGIC WITH INCRUSE. RT ADDED ALBUTEROL  BID. PT DOING WELL AT THIS TIME.

## 2024-05-07 DIAGNOSIS — E1151 Type 2 diabetes mellitus with diabetic peripheral angiopathy without gangrene: Secondary | ICD-10-CM | POA: Diagnosis present

## 2024-05-07 DIAGNOSIS — N1832 Chronic kidney disease, stage 3b: Secondary | ICD-10-CM | POA: Diagnosis present

## 2024-05-07 DIAGNOSIS — F32A Depression, unspecified: Secondary | ICD-10-CM | POA: Diagnosis present

## 2024-05-07 DIAGNOSIS — J441 Chronic obstructive pulmonary disease with (acute) exacerbation: Secondary | ICD-10-CM | POA: Diagnosis present

## 2024-05-07 DIAGNOSIS — I130 Hypertensive heart and chronic kidney disease with heart failure and stage 1 through stage 4 chronic kidney disease, or unspecified chronic kidney disease: Secondary | ICD-10-CM | POA: Diagnosis present

## 2024-05-07 DIAGNOSIS — I5020 Unspecified systolic (congestive) heart failure: Secondary | ICD-10-CM | POA: Diagnosis not present

## 2024-05-07 DIAGNOSIS — Z8249 Family history of ischemic heart disease and other diseases of the circulatory system: Secondary | ICD-10-CM | POA: Diagnosis not present

## 2024-05-07 DIAGNOSIS — I5043 Acute on chronic combined systolic (congestive) and diastolic (congestive) heart failure: Secondary | ICD-10-CM | POA: Diagnosis present

## 2024-05-07 DIAGNOSIS — Z87891 Personal history of nicotine dependence: Secondary | ICD-10-CM | POA: Diagnosis not present

## 2024-05-07 DIAGNOSIS — I3139 Other pericardial effusion (noninflammatory): Secondary | ICD-10-CM | POA: Diagnosis present

## 2024-05-07 DIAGNOSIS — E291 Testicular hypofunction: Secondary | ICD-10-CM | POA: Diagnosis present

## 2024-05-07 DIAGNOSIS — E1122 Type 2 diabetes mellitus with diabetic chronic kidney disease: Secondary | ICD-10-CM | POA: Diagnosis present

## 2024-05-07 DIAGNOSIS — Z955 Presence of coronary angioplasty implant and graft: Secondary | ICD-10-CM | POA: Diagnosis not present

## 2024-05-07 DIAGNOSIS — E785 Hyperlipidemia, unspecified: Secondary | ICD-10-CM | POA: Diagnosis present

## 2024-05-07 DIAGNOSIS — F419 Anxiety disorder, unspecified: Secondary | ICD-10-CM | POA: Diagnosis present

## 2024-05-07 DIAGNOSIS — Z9981 Dependence on supplemental oxygen: Secondary | ICD-10-CM | POA: Diagnosis not present

## 2024-05-07 DIAGNOSIS — Z7902 Long term (current) use of antithrombotics/antiplatelets: Secondary | ICD-10-CM | POA: Diagnosis not present

## 2024-05-07 DIAGNOSIS — R0602 Shortness of breath: Secondary | ICD-10-CM | POA: Diagnosis present

## 2024-05-07 DIAGNOSIS — Z794 Long term (current) use of insulin: Secondary | ICD-10-CM | POA: Diagnosis not present

## 2024-05-07 DIAGNOSIS — Z7984 Long term (current) use of oral hypoglycemic drugs: Secondary | ICD-10-CM | POA: Diagnosis not present

## 2024-05-07 DIAGNOSIS — Z7985 Long-term (current) use of injectable non-insulin antidiabetic drugs: Secondary | ICD-10-CM | POA: Diagnosis not present

## 2024-05-07 DIAGNOSIS — J910 Malignant pleural effusion: Secondary | ICD-10-CM | POA: Diagnosis present

## 2024-05-07 DIAGNOSIS — I2510 Atherosclerotic heart disease of native coronary artery without angina pectoris: Secondary | ICD-10-CM | POA: Diagnosis present

## 2024-05-07 DIAGNOSIS — C3412 Malignant neoplasm of upper lobe, left bronchus or lung: Secondary | ICD-10-CM | POA: Diagnosis present

## 2024-05-07 DIAGNOSIS — I509 Heart failure, unspecified: Secondary | ICD-10-CM

## 2024-05-07 DIAGNOSIS — J9621 Acute and chronic respiratory failure with hypoxia: Secondary | ICD-10-CM | POA: Diagnosis present

## 2024-05-07 DIAGNOSIS — Z79899 Other long term (current) drug therapy: Secondary | ICD-10-CM | POA: Diagnosis not present

## 2024-05-07 LAB — CBC
HCT: 45 % (ref 39.0–52.0)
Hemoglobin: 14.8 g/dL (ref 13.0–17.0)
MCH: 32.3 pg (ref 26.0–34.0)
MCHC: 32.9 g/dL (ref 30.0–36.0)
MCV: 98.3 fL (ref 80.0–100.0)
Platelets: 222 K/uL (ref 150–400)
RBC: 4.58 MIL/uL (ref 4.22–5.81)
RDW: 14.6 % (ref 11.5–15.5)
WBC: 6.6 K/uL (ref 4.0–10.5)
nRBC: 0 % (ref 0.0–0.2)

## 2024-05-07 LAB — PROCALCITONIN: Procalcitonin: <0.1 ng/mL

## 2024-05-07 LAB — COMPREHENSIVE METABOLIC PANEL WITH GFR
ALT: 14 U/L (ref 0–44)
AST: 13 U/L — ABNORMAL LOW (ref 15–41)
Albumin: 3.1 g/dL — ABNORMAL LOW (ref 3.5–5.0)
Alkaline Phosphatase: 59 U/L (ref 38–126)
Anion gap: 11 (ref 5–15)
BUN: 26 mg/dL — ABNORMAL HIGH (ref 8–23)
CO2: 22 mmol/L (ref 22–32)
Calcium: 8.7 mg/dL — ABNORMAL LOW (ref 8.9–10.3)
Chloride: 106 mmol/L (ref 98–111)
Creatinine, Ser: 1.42 mg/dL — ABNORMAL HIGH (ref 0.61–1.24)
GFR, Estimated: 48 mL/min — ABNORMAL LOW (ref >60)
Glucose, Bld: 189 mg/dL — ABNORMAL HIGH (ref 70–99)
Potassium: 3.7 mmol/L (ref 3.5–5.1)
Sodium: 139 mmol/L (ref 135–145)
Total Bilirubin: 0.7 mg/dL (ref 0.0–1.2)
Total Protein: 6.7 g/dL (ref 6.5–8.1)

## 2024-05-07 LAB — GLUCOSE, CAPILLARY
Glucose-Capillary: 162 mg/dL — ABNORMAL HIGH (ref 70–99)
Glucose-Capillary: 124 mg/dL — ABNORMAL HIGH (ref 70–99)
Glucose-Capillary: 212 mg/dL — ABNORMAL HIGH (ref 70–99)
Glucose-Capillary: 174 mg/dL — ABNORMAL HIGH (ref 70–99)

## 2024-05-07 MED ORDER — SPIRONOLACTONE 12.5 MG HALF TABLET
12.5000 mg | ORAL_TABLET | Freq: Every day | ORAL | Status: DC
  Administered 2024-05-07 – 2024-05-08 (×2): 12.5 mg via ORAL
  Filled 2024-05-07 (×2): qty 1

## 2024-05-07 MED ORDER — FUROSEMIDE 20 MG PO TABS
20.0000 mg | ORAL_TABLET | Freq: Every day | ORAL | Status: DC
  Administered 2024-05-08: 20 mg via ORAL
  Filled 2024-05-07 (×2): qty 1

## 2024-05-07 NOTE — Progress Notes (Signed)
 Cardiologist:  Court  Subjective:  Denies SSCP, palpitations or Dyspnea   Objective:  Vitals:   05/07/24 0452 05/07/24 0836 05/07/24 0857 05/07/24 0900  BP: (!) 135/91     Pulse: 77     Resp: 19  (!) 24 (!) 22  Temp: (!) 97.4 F (36.3 C)     TempSrc: Oral     SpO2: 100% 94%    Weight:      Height:        Intake/Output from previous day:  Intake/Output Summary (Last 24 hours) at 05/07/2024 1005 Last data filed at 05/07/2024 0810 Gross per 24 hour  Intake 360.53 ml  Output 2459 ml  Net -2098.47 ml    Physical Exam:  Inspiratory wheezes rhonchi improved bilaterally JVP normal  No Kussmaul sing Abdomen benign No murmur  Plus one edema   Lab Results: Basic Metabolic Panel: Recent Labs    05/06/24 0647 05/07/24 0617  NA 137 139  K 3.6 3.7  CL 101 106  CO2 23 22  GLUCOSE 144* 189*  BUN 23 26*  CREATININE 1.37* 1.42*  CALCIUM 8.9 8.7*  MG 2.4  --   PHOS 3.5  --    Liver Function Tests: Recent Labs    05/06/24 0647 05/07/24 0617  AST 22 13*  ALT 15 14  ALKPHOS 71 59  BILITOT 0.9 0.7  PROT 7.5 6.7  ALBUMIN 3.5 3.1*   No results for input(s): LIPASE, AMYLASE in the last 72 hours. CBC: Recent Labs    05/06/24 0647 05/07/24 0617  WBC 7.9 6.6  NEUTROABS 4.6  --   HGB 16.0 14.8  HCT 48.8 45.0  MCV 98.4 98.3  PLT 208 222    Imaging: DG Chest Port 1 View Result Date: 05/06/2024 CLINICAL DATA:  Shortness of breath, decreased O2 sats. EXAM: PORTABLE CHEST 1 VIEW COMPARISON:  05/06/2024 and CT chest 05/06/2024. FINDINGS: Trachea is midline. Heart is enlarged. Masslike consolidation in the perihilar left upper lobe, better evaluated on CT chest 05/06/2024. Similar left basilar coarsened airspace opacification. Minimal coarsening in the right lung base. Small bilateral pleural effusions. IMPRESSION: 1. Masslike collapse/consolidation in the left upper lobe with presumed changes of radiation therapy in the adjacent left lung, as before. Difficult to  exclude superimposed pneumonia. 2. Small bilateral pleural effusions. Electronically Signed   By: Newell Eke M.D.   On: 05/06/2024 17:22   ECHOCARDIOGRAM COMPLETE Result Date: 05/06/2024    ECHOCARDIOGRAM REPORT   Patient Name:   Jonathan [name]. Date of Exam: 05/06/2024 Medical Rec #:  [id number]           Height:       74.0 in Accession #:    [id number]          Weight:       209.5 lb Date of Birth:  [birth year]            BSA:          2.217 m Patient Age:    88 years            BP:           143/85 mmHg Patient Gender: M                   HR:           91 bpm. Exam Location:  Inpatient Procedure: 2D Echo, Cardiac Doppler and Color Doppler (Both Spectral and Color  Flow Doppler were utilized during procedure). Indications:    Pericardial Effusion  History:        Patient has prior history of Echocardiogram examinations.                 Pericardial Disease; Risk Factors:Hypertension.  Sonographer:    Vella Key Referring Phys: [id number] Kedrick Mcnamee C Ryn Peine IMPRESSIONS  1. Left ventricular ejection fraction, by estimation, is 20 to 25%. The left ventricle has severely decreased function. The left ventricle demonstrates global hypokinesis. There is moderate concentric left ventricular hypertrophy. Left ventricular diastolic parameters are indeterminate.  2. Right ventricular systolic function is normal. The right ventricular size is normal.  3. Left atrial size was mildly dilated.  4. There is a small circumfrential pericardial effusion with a moderate focal collection abutting the RA. There is mild invagination of the RA but no overt tamponade. a small pericardial effusion is present. The pericardial effusion is circumferential and localized near the right atrium. There is no evidence of cardiac tamponade.  5. The mitral valve is normal in structure. Trivial mitral valve regurgitation. No evidence of mitral stenosis.  6. The aortic valve is tricuspid. There is mild calcification of the aortic valve. Aortic  valve regurgitation is mild. Aortic valve sclerosis/calcification is present, without any evidence of aortic stenosis.  7. Aortic dilatation noted. There is borderline dilatation of the aortic root, measuring 38 mm. There is borderline dilatation of the ascending aorta, measuring 38 mm.  8. The inferior vena cava is dilated in size with >50% respiratory variability, suggesting right atrial pressure of 8 mmHg. FINDINGS  Left Ventricle: Left ventricular ejection fraction, by estimation, is 20 to 25%. The left ventricle has severely decreased function. The left ventricle demonstrates global hypokinesis. The left ventricular internal cavity size was normal in size. There is moderate concentric left ventricular hypertrophy. Left ventricular diastolic parameters are indeterminate. Right Ventricle: The right ventricular size is normal. No increase in right ventricular wall thickness. Right ventricular systolic function is normal. Left Atrium: Left atrial size was mildly dilated. Right Atrium: Right atrial size was normal in size. Pericardium: There is a small circumfrential pericardial effusion with a moderate focal collection abutting the RA. There is mild invagination of the RA but no overt tamponade. A small pericardial effusion is present. The pericardial effusion is circumferential and localized near the right atrium. There is no evidence of cardiac tamponade. Mitral Valve: The mitral valve is normal in structure. Trivial mitral valve regurgitation. No evidence of mitral valve stenosis. Tricuspid Valve: The tricuspid valve is normal in structure. Tricuspid valve regurgitation is trivial. No evidence of tricuspid stenosis. Aortic Valve: The aortic valve is tricuspid. There is mild calcification of the aortic valve. Aortic valve regurgitation is mild. Aortic valve sclerosis/calcification is present, without any evidence of aortic stenosis. Pulmonic Valve: The pulmonic valve was normal in structure. Pulmonic valve  regurgitation is trivial. No evidence of pulmonic stenosis. Aorta: Aortic dilatation noted. There is borderline dilatation of the aortic root, measuring 38 mm. There is borderline dilatation of the ascending aorta, measuring 38 mm. Venous: The inferior vena cava is dilated in size with greater than 50% respiratory variability, suggesting right atrial pressure of 8 mmHg. IAS/Shunts: No atrial level shunt detected by color flow Doppler.  LEFT VENTRICLE PLAX 2D LVIDd:         4.70 cm      Diastology LVIDs:         4.10 cm      LV e' medial:  4.29 cm/s LV PW:         1.40 cm      LV E/e' medial:  27.0 LV IVS:        1.40 cm      LV e' lateral:   5.08 cm/s LVOT diam:     2.10 cm      LV E/e' lateral: 22.8 LV SV:         70 LV SV Index:   32 LVOT Area:     3.46 cm  LV Volumes (MOD) LV vol d, MOD A2C: 127.0 ml LV vol d, MOD A4C: 134.0 ml LV vol s, MOD A2C: 92.8 ml LV vol s, MOD A4C: 88.9 ml LV SV MOD A2C:     34.2 ml LV SV MOD A4C:     134.0 ml LV SV MOD BP:      41.2 ml RIGHT VENTRICLE RV Basal diam:  3.40 cm RV S prime:     13.10 cm/s TAPSE (M-mode): 3.1 cm LEFT ATRIUM             Index        RIGHT ATRIUM           Index LA diam:        3.40 cm 1.53 cm/m   RA Area:     18.00 cm LA Vol (A2C):   76.0 ml 34.28 ml/m  RA Volume:   47.90 ml  21.61 ml/m LA Vol (A4C):   52.0 ml 23.46 ml/m LA Biplane Vol: 70.1 ml 31.62 ml/m  AORTIC VALVE LVOT Vmax:   110.00 cm/s LVOT Vmean:  76.400 cm/s LVOT VTI:    0.203 m  AORTA Ao Root diam: 3.80 cm Ao Asc diam:  3.80 cm MITRAL VALVE MV Area (PHT): 8.43 cm     SHUNTS MV Decel Time: 90 msec      Systemic VTI:  0.20 m MV E velocity: 116.00 cm/s  Systemic Diam: 2.10 cm MV A velocity: 60.00 cm/s MV E/A ratio:  1.93 Toribio Fuel MD Electronically signed by Toribio Fuel MD Signature Date/Time: 05/06/2024/12:35:30 PM    Final    CT Chest W Contrast Addendum Date: 05/06/2024 ADDENDUM REPORT: 05/06/2024 10:11 ADDENDUM: The original report was by Dr. Ryan Salvage. The following  addendum is by Dr. Ryan Salvage: These results were called by telephone at the time of interpretation on 05/06/2024 at 10:02 am to provider [facility] , who verbally acknowledged these results. Electronically Signed   By: Ryan Salvage M.D.   On: 05/06/2024 10:11   Result Date: 05/06/2024 CLINICAL DATA:  Left upper lobe non-small cell lung cancer, hypoxia and shortness of breath. * Tracking Code: BO * EXAM: CT CHEST WITH CONTRAST TECHNIQUE: Multidetector CT imaging of the chest was performed during intravenous contrast administration. RADIATION DOSE REDUCTION: This exam was performed according to the departmental dose-optimization program which includes automated exposure control, adjustment of the mA and/or kV according to patient size and/or use of iterative reconstruction technique. CONTRAST:  75mL OMNIPAQUE  IOHEXOL  300 MG/ML  SOLN COMPARISON:  01/16/2024 FINDINGS: Cardiovascular: Coronary, aortic arch, and branch vessel atherosclerotic vascular disease. Large and substantially increased pericardial effusion. Mediastinum/Nodes: Borderline prominence of right infrahilar nodal tissue. Lungs/Pleura: Moderate left and small right pleural effusions with passive atelectasis. Centrilobular emphysema. Hazy airspace opacity in the aerated portion of the left lower lobe for example on image 79 series 6, suspicious for pneumonia or pneumonitis. Residuum of the left upper lobe mass measures proximally 4.0 by 1.5  cm, previously 4.5 by 1.9 cm on 01/16/2024. Airway plugging of the apicoposterior segment left upper lobe. Right upper lobe subpleural nodule 0.6 cm in diameter on image 82 series 6, stable. Upper Abdomen: Left adrenal nodule 2.1 by 1.7 cm, stable by my measurements, indeterminate for malignancy versus benign etiology. Musculoskeletal: Unremarkable IMPRESSION: 1. Large and substantially increased pericardial effusion compared to 01/16/2024. Correlate with physical exam indicators of tamponade in deciding  whether echocardiographic workup is indicated. 2. Moderate left and small right pleural effusions with passive atelectasis. 3. Hazy airspace opacity in the aerated portion of the left lower lobe suspicious for pneumonia or pneumonitis. 4. Residuum of the left upper lobe mass measures 4.0 by 1.5 cm, previously 4.5 by 1.9 cm on 01/16/2024. 5. Airway plugging of the apicoposterior segment left upper lobe. 6. Stable 0.6 cm right upper lobe subpleural nodule. 7. Stable left adrenal nodule, indeterminate for malignancy versus benign etiology (although chronicity dating back to 05/03/2010 tends to favor benign etiology). 8. Aortic Atherosclerosis (ICD10-I70.0) and Emphysema (ICD10-J43.9). Radiology assistant personnel have been notified to put me in telephone contact with the referring physician or the referring physician's clinical representative in order to discuss these findings. Once this communication is established I will issue an addendum to this report for documentation purposes. Electronically Signed: By: Ryan Salvage M.D. On: 05/06/2024 09:52   DG Chest Port 1 View Result Date: 05/06/2024 CLINICAL DATA:  Shortness of breath. History of non-small-cell lung cancer. EXAM: PORTABLE CHEST 1 VIEW COMPARISON:  02/26/2024 FINDINGS: The cardio pericardial silhouette is enlarged. Interstitial markings are diffusely coarsened with chronic features. Hazy left parahilar opacity is similar to prior. The lungs are clear without focal pneumonia, edema, pneumothorax or pleural effusion. No acute bony abnormality. Telemetry leads overlie the chest. IMPRESSION: Stable. Chronic interstitial coarsening without new or acute cardiopulmonary findings. Similar appearance hazy left parahilar opacity, region of patient's known lesion. Electronically Signed   By: Camellia Candle M.D.   On: 05/06/2024 07:26    Cardiac Studies:  ECG:  Orders placed or performed during the hospital encounter of 05/06/24   ED EKG   ED EKG   ED EKG    ED EKG   *Note: Due to a large number of results and/or encounters for the requested time period, some results have not been displayed. A complete set of results can be found in Results Review.     Telemetry:  NSR   Echo: Small pericardial effusion no tamponade decreased EF 20-25%   Medications:    albuterol   2.5 mg Nebulization BID   albuterol   2.5 mg Nebulization Once   arformoterol   15 mcg Nebulization BID   And   umeclidinium bromide   1 puff Inhalation Daily   clopidogrel   75 mg Oral Daily   empagliflozin   25 mg Oral Daily   enoxaparin  (LOVENOX ) injection  40 mg Subcutaneous Q24H   furosemide   40 mg Intravenous BID   insulin  aspart  0-20 Units Subcutaneous TID WC   [START ON 05/08/2024] metFORMIN   500 mg Oral BID WC   metoprolol  tartrate  100 mg Oral BID   predniSONE   40 mg Oral Q breakfast   sacubitril -valsartan   1 tablet Oral BID   simvastatin   20 mg Oral Daily   tamsulosin   0.4 mg Oral QPC supper      azithromycin  Stopped (05/06/24 1835)   cefTRIAXone  (ROCEPHIN )  IV Stopped (05/06/24 1730)    Assessment/Plan:   Pericardial Effusion:  small no tamponade no need to tap CHF:  new on set systolic CHF. Have adjusted his meds to include Jardiance , Toprol , entresto  and low dose aldactone  Would change to PO lasix  20 mg Check lytes K/Cr in am and consider d/c in am Lung cancer:  stage 3 can no longer have anthracycline based chemo. Will need f/u for progressive dx by CTA  Will arrange f/u with Dr Lavona regarding CHF  Maude Emmer 05/07/2024, 10:05 AM

## 2024-05-07 NOTE — Progress Notes (Signed)
  Progress Note   Patient: Jonathan [name]. FMW:[id number] DOB: 07/21/1936 DOA: 05/06/2024     0 DOS: the patient was seen and examined on 05/07/2024   Brief hospital course: 88 y.o. male with medical history significant of anxiety, depression, CAD, chronic combined systolic and diastolic heart failure, history of coronary stents, colon polyps, type 2 diabetes, hyperlipidemia, hypertension, hypogonadism, peripheral vascular disease, COPD, chronic respiratory failure with hypoxia on home oxygen  at 2 LPM who presented to the emergency department complaints of dyspnea and hypoxia at home.  He has also had wheezing and an occasional nonproductive cough. He denied fever, chills, rhinorrhea, sore throat or hemoptysis.  No chest pain, palpitations, diaphoresis, PND, orthopnea or recent pitting edema of the lower extremities.  No abdominal pain, nausea, emesis, diarrhea, constipation, melena or hematochezia.  No flank pain, dysuria, frequency or hematuria.  No polyuria, polydipsia, polyphagia or blurred vision.   Assessment and Plan: Principal Problem:   Acute on chronic respiratory failure with hypoxia (HCC) In the setting of:     COPD with acute exacerbation (HCC) And:    Acute on chronic combined systolic  and diastolic CHF (congestive heart failure) (HCC) With associated:   Pericardial effusion And:   Pleural effusion -Had been continued on IV lasix   -Cardiology following, now on PO lasix  -Now on jardiance , Toprol , entresto  and low dose aldactone     Active Problems:   Abnormal chest CT  Questionable pneumonia versus pneumonitis. Was given empiric CAP antibiotics. Procal is <0.1     CAD (coronary artery disease)   PVD (peripheral vascular disease) (HCC) Continue beta-blocker, clopidogrel  and statin.     Essential hypertension  Continue furosemide  Entresto  and metoprolol  as above.     CKD stage 3b, GFR 30-44 ml/min (HCC) Normal renal function electrolytes.      Hyperlipidemia Continue simvastatin  20 mg p.o. daily.     Anxiety and depression Continue hydroxyzine  at bedtime. Patient also on tamsulosin , but no notes from urology. No BPH or another prostate related diagnosis in problem list.     Squamous cell carcinoma of upper lobe of left lung (HCC) Mass decreasing in size. Follow-up with oncology as scheduled.      Subjective: Eager to go home soon  Physical Exam: Vitals:   05/07/24 0900 05/07/24 1412 05/07/24 1500 05/07/24 1600  BP:  128/71    Pulse:  78    Resp: (!) 22 20 18  (!) 22  Temp:  97.7 F (36.5 C)    TempSrc:  Oral    SpO2:  95%    Weight:      Height:       General exam: Awake, laying in bed, in nad Respiratory system: Normal respiratory effort, no wheezing Cardiovascular system: regular rate, s1, s2 Gastrointestinal system: Soft, nondistended, positive BS Central nervous system: CN2-12 grossly intact, strength intact Extremities: Perfused, no clubbing Skin: Normal skin turgor, no notable skin lesions seen Psychiatry: Mood normal // no visual hallucinations   Data Reviewed:  Labs reviewed: Na 139, K 3.7, Cr 1.42, WBC 6.6, Hgb 14.8, Plts 222  Family Communication: Pt in room, family not at bedside  Disposition: Status is: Observation The patient will require care spanning > 2 midnights and should be moved to inpatient because: severity of illness  Planned Discharge Destination: Home    Author: Garnette Pelt, MD 05/07/2024 4:57 PM  For on call review [email or web address].

## 2024-05-07 NOTE — Plan of Care (Signed)
  Problem: Education: Goal: Knowledge of General Education information will improve Description: Including pain rating scale, medication(s)/side effects and non-pharmacologic comfort measures Outcome: Progressing   Problem: Health Behavior/Discharge Planning: Goal: Ability to manage health-related needs will improve Outcome: Progressing   Problem: Clinical Measurements: Goal: Ability to maintain clinical measurements within normal limits will improve Outcome: Progressing Goal: Will remain free from infection Outcome: Progressing Goal: Diagnostic test results will improve Outcome: Progressing Goal: Respiratory complications will improve Outcome: Progressing Goal: Cardiovascular complication will be avoided Outcome: Progressing   Problem: Activity: Goal: Risk for activity intolerance will decrease Outcome: Progressing   Problem: Nutrition: Goal: Adequate nutrition will be maintained Outcome: Progressing   Problem: Coping: Goal: Level of anxiety will decrease Outcome: Progressing   Problem: Elimination: Goal: Will not experience complications related to bowel motility Outcome: Progressing Goal: Will not experience complications related to urinary retention Outcome: Progressing   Problem: Pain Managment: Goal: General experience of comfort will improve and/or be controlled Outcome: Progressing   Problem: Safety: Goal: Ability to remain free from injury will improve Outcome: Progressing   Problem: Skin Integrity: Goal: Risk for impaired skin integrity will decrease Outcome: Progressing   Problem: Education: Goal: Ability to describe self-care measures that may prevent or decrease complications (Diabetes Survival Skills Education) will improve Outcome: Progressing Goal: Individualized Educational Video(s) Outcome: Progressing   Problem: Coping: Goal: Ability to adjust to condition or change in health will improve Outcome: Progressing   Problem: Fluid  Volume: Goal: Ability to maintain a balanced intake and output will improve Outcome: Progressing   Problem: Health Behavior/Discharge Planning: Goal: Ability to identify and utilize available resources and services will improve Outcome: Progressing Goal: Ability to manage health-related needs will improve Outcome: Progressing   Problem: Metabolic: Goal: Ability to maintain appropriate glucose levels will improve Outcome: Progressing   Problem: Nutritional: Goal: Maintenance of adequate nutrition will improve Outcome: Progressing Goal: Progress toward achieving an optimal weight will improve Outcome: Progressing   Problem: Skin Integrity: Goal: Risk for impaired skin integrity will decrease Outcome: Progressing   Problem: Tissue Perfusion: Goal: Adequacy of tissue perfusion will improve Outcome: Progressing   Problem: Education: Goal: Knowledge of disease or condition will improve Outcome: Progressing Goal: Knowledge of the prescribed therapeutic regimen will improve Outcome: Progressing Goal: Individualized Educational Video(s) Outcome: Progressing   Problem: Activity: Goal: Ability to tolerate increased activity will improve Outcome: Progressing Goal: Will verbalize the importance of balancing activity with adequate rest periods Outcome: Progressing   Problem: Respiratory: Goal: Ability to maintain a clear airway will improve Outcome: Progressing Goal: Levels of oxygenation will improve Outcome: Progressing Goal: Ability to maintain adequate ventilation will improve Outcome: Progressing   Problem: Activity: Goal: Ability to tolerate increased activity will improve Outcome: Progressing   Problem: Clinical Measurements: Goal: Ability to maintain a body temperature in the normal range will improve Outcome: Progressing   Problem: Respiratory: Goal: Ability to maintain adequate ventilation will improve Outcome: Progressing Goal: Ability to maintain a clear  airway will improve Outcome: Progressing

## 2024-05-07 NOTE — Care Management Obs Status (Signed)
 MEDICARE OBSERVATION STATUS NOTIFICATION   Patient Details  Name: Jonathan [name]. MRN: [id number] Date of Birth: 10-01-1935   Medicare Observation Status Notification Given:  Yes    Toy LITTIE Agar, RN 05/07/2024, 4:01 PM

## 2024-05-07 NOTE — Progress Notes (Signed)
 Messaged Dr. Nishan and verified he did not want patient to receive an extra dose of lasix  today, patient in bed resting, no acute distress noted

## 2024-05-07 NOTE — Hospital Course (Signed)
 88 y.o. male with medical history significant of anxiety, depression, CAD, chronic combined systolic and diastolic heart failure, history of coronary stents, colon polyps, type 2 diabetes, hyperlipidemia, hypertension, hypogonadism, peripheral vascular disease, COPD, chronic respiratory failure with hypoxia on home oxygen  at 2 LPM who presented to the emergency department complaints of dyspnea and hypoxia at home.  He has also had wheezing and an occasional nonproductive cough. He denied fever, chills, rhinorrhea, sore throat or hemoptysis.  No chest pain, palpitations, diaphoresis, PND, orthopnea or recent pitting edema of the lower extremities.  No abdominal pain, nausea, emesis, diarrhea, constipation, melena or hematochezia.  No flank pain, dysuria, frequency or hematuria.  No polyuria, polydipsia, polyphagia or blurred vision.

## 2024-05-08 ENCOUNTER — Other Ambulatory Visit (HOSPITAL_COMMUNITY): Payer: Self-pay

## 2024-05-08 DIAGNOSIS — R0602 Shortness of breath: Secondary | ICD-10-CM

## 2024-05-08 DIAGNOSIS — I3139 Other pericardial effusion (noninflammatory): Secondary | ICD-10-CM | POA: Diagnosis not present

## 2024-05-08 DIAGNOSIS — J910 Malignant pleural effusion: Secondary | ICD-10-CM | POA: Diagnosis not present

## 2024-05-08 DIAGNOSIS — I5020 Unspecified systolic (congestive) heart failure: Secondary | ICD-10-CM

## 2024-05-08 LAB — BASIC METABOLIC PANEL WITH GFR
Anion gap: 8 (ref 5–15)
BUN: 34 mg/dL — ABNORMAL HIGH (ref 8–23)
CO2: 22 mmol/L (ref 22–32)
Calcium: 8.3 mg/dL — ABNORMAL LOW (ref 8.9–10.3)
Chloride: 108 mmol/L (ref 98–111)
Creatinine, Ser: 1.31 mg/dL — ABNORMAL HIGH (ref 0.61–1.24)
GFR, Estimated: 53 mL/min — ABNORMAL LOW (ref >60)
Glucose, Bld: 121 mg/dL — ABNORMAL HIGH (ref 70–99)
Potassium: 3.5 mmol/L (ref 3.5–5.1)
Sodium: 138 mmol/L (ref 135–145)

## 2024-05-08 LAB — GLUCOSE, CAPILLARY
Glucose-Capillary: 149 mg/dL — ABNORMAL HIGH (ref 70–99)
Glucose-Capillary: 168 mg/dL — ABNORMAL HIGH (ref 70–99)

## 2024-05-08 MED ORDER — FUROSEMIDE 20 MG PO TABS
20.0000 mg | ORAL_TABLET | Freq: Every day | ORAL | 0 refills | Status: DC
  Filled 2024-05-08: qty 30, 30d supply, fill #0

## 2024-05-08 MED ORDER — METOPROLOL TARTRATE 100 MG PO TABS
100.0000 mg | ORAL_TABLET | Freq: Two times a day (BID) | ORAL | 0 refills | Status: DC
  Filled 2024-05-08: qty 120, 60d supply, fill #0

## 2024-05-08 MED ORDER — SPIRONOLACTONE 25 MG PO TABS
12.5000 mg | ORAL_TABLET | Freq: Every day | ORAL | 0 refills | Status: DC
  Filled 2024-05-08: qty 15, 30d supply, fill #0

## 2024-05-08 NOTE — Progress Notes (Incomplete)
 Rounding Note   Patient Name: Jonathan [name]. Date of Encounter: 05/08/2024   [facility] Cardiologist: Dorn Lesches, MD ***  Subjective ***  Scheduled Meds:  albuterol   2.5 mg Nebulization BID   albuterol   2.5 mg Nebulization Once   arformoterol   15 mcg Nebulization BID   And   umeclidinium bromide   1 puff Inhalation Daily   clopidogrel   75 mg Oral Daily   empagliflozin   25 mg Oral Daily   enoxaparin  (LOVENOX ) injection  40 mg Subcutaneous Q24H   furosemide   20 mg Oral Daily   insulin  aspart  0-20 Units Subcutaneous TID WC   metFORMIN   500 mg Oral BID WC   metoprolol  tartrate  100 mg Oral BID   predniSONE   40 mg Oral Q breakfast   sacubitril -valsartan   1 tablet Oral BID   simvastatin   20 mg Oral Daily   spironolactone   12.5 mg Oral Daily   tamsulosin   0.4 mg Oral QPC supper   Continuous Infusions:  azithromycin  500 mg (05/07/24 1638)   cefTRIAXone  (ROCEPHIN )  IV 1 g (05/07/24 1812)   PRN Meds: acetaminophen  **OR** acetaminophen , albuterol , hydrOXYzine , lip balm, ondansetron  **OR** ondansetron  (ZOFRAN ) IV   Vital Signs  Vitals:   05/07/24 1822 05/07/24 1824 05/07/24 2122 05/08/24 0541  BP:   123/76 109/69  Pulse:   86 71  Resp:  (!) 21 19 20   Temp:   98.4 F (36.9 C) 98 F (36.7 C)  TempSrc:   Oral   SpO2: 97% 97% 96% 100%  Weight:      Height:        Intake/Output Summary (Last 24 hours) at 05/08/2024 0751 Last data filed at 05/08/2024 0600 Gross per 24 hour  Intake 1130.09 ml  Output 1975 ml  Net -844.91 ml      05/07/2024    8:57 AM 05/06/2024    8:17 PM 05/06/2024   12:42 PM  Last 3 Weights  Weight (lbs) 201 lb 15.1 oz 201 lb 4.5 oz 198 lb 11.2 oz  Weight (kg) 91.6 kg 91.3 kg 90.13 kg      Telemetry *** - Personally Reviewed  ECG  *** - Personally Reviewed  Physical Exam *** GEN: No acute distress.   Neck: No JVD Cardiac: RRR, no murmurs, rubs, or gallops.  Respiratory: Clear to auscultation bilaterally. GI: Soft,  nontender, non-distended  MS: No edema; No deformity. Neuro:  Nonfocal  Psych: Normal affect   Labs High Sensitivity Troponin:   Recent Labs  Lab 05/06/24 0647 05/06/24 0851  TROPONINIHS 22* 22*     Chemistry Recent Labs  Lab 05/06/24 0647 05/07/24 0617 05/08/24 0605  NA 137 139 138  K 3.6 3.7 3.5  CL 101 106 108  CO2 23 22 22   GLUCOSE 144* 189* 121*  BUN 23 26* 34*  CREATININE 1.37* 1.42* 1.31*  CALCIUM 8.9 8.7* 8.3*  MG 2.4  --   --   PROT 7.5 6.7  --   ALBUMIN 3.5 3.1*  --   AST 22 13*  --   ALT 15 14  --   ALKPHOS 71 59  --   BILITOT 0.9 0.7  --   GFRNONAA 50* 48* 53*  ANIONGAP 13 11 8     Lipids No results for input(s): CHOL, TRIG, HDL, LABVLDL, LDLCALC, CHOLHDL in the last 168 hours.  Hematology Recent Labs  Lab 05/06/24 0647 05/07/24 0617  WBC 7.9 6.6  RBC 4.96 4.58  HGB 16.0 14.8  HCT 48.8 45.0  MCV 98.4 98.3  MCH 32.3 32.3  MCHC 32.8 32.9  RDW 14.5 14.6  PLT 208 222   Thyroid  No results for input(s): TSH, FREET4 in the last 168 hours.  BNP Recent Labs  Lab 05/06/24 1530  BNP 502.4*    DDimer No results for input(s): DDIMER in the last 168 hours.   Radiology  DG Chest Port 1 View Result Date: 05/06/2024 CLINICAL DATA:  Shortness of breath, decreased O2 sats. EXAM: PORTABLE CHEST 1 VIEW COMPARISON:  05/06/2024 and CT chest 05/06/2024. FINDINGS: Trachea is midline. Heart is enlarged. Masslike consolidation in the perihilar left upper lobe, better evaluated on CT chest 05/06/2024. Similar left basilar coarsened airspace opacification. Minimal coarsening in the right lung base. Small bilateral pleural effusions. IMPRESSION: 1. Masslike collapse/consolidation in the left upper lobe with presumed changes of radiation therapy in the adjacent left lung, as before. Difficult to exclude superimposed pneumonia. 2. Small bilateral pleural effusions. Electronically Signed   By: Newell Eke M.D.   On: 05/06/2024 17:22   ECHOCARDIOGRAM  COMPLETE Result Date: 05/06/2024    ECHOCARDIOGRAM REPORT   Patient Name:   Jonathan [name]. Date of Exam: 05/06/2024 Medical Rec #:  [id number]           Height:       74.0 in Accession #:    [id number]          Weight:       209.5 lb Date of Birth:  [birth year]            BSA:          2.217 m Patient Age:    88 years            BP:           143/85 mmHg Patient Gender: M                   HR:           91 bpm. Exam Location:  Inpatient Procedure: 2D Echo, Cardiac Doppler and Color Doppler (Both Spectral and Color            Flow Doppler were utilized during procedure). Indications:    Pericardial Effusion  History:        Patient has prior history of Echocardiogram examinations.                 Pericardial Disease; Risk Factors:Hypertension.  Sonographer:    Vella Key Referring Phys: [id number] PETER C NISHAN IMPRESSIONS  1. Left ventricular ejection fraction, by estimation, is 20 to 25%. The left ventricle has severely decreased function. The left ventricle demonstrates global hypokinesis. There is moderate concentric left ventricular hypertrophy. Left ventricular diastolic parameters are indeterminate.  2. Right ventricular systolic function is normal. The right ventricular size is normal.  3. Left atrial size was mildly dilated.  4. There is a small circumfrential pericardial effusion with a moderate focal collection abutting the RA. There is mild invagination of the RA but no overt tamponade. a small pericardial effusion is present. The pericardial effusion is circumferential and localized near the right atrium. There is no evidence of cardiac tamponade.  5. The mitral valve is normal in structure. Trivial mitral valve regurgitation. No evidence of mitral stenosis.  6. The aortic valve is tricuspid. There is mild calcification of the aortic valve. Aortic valve regurgitation is mild. Aortic valve sclerosis/calcification is present, without any evidence of aortic stenosis.  7. Aortic dilatation noted. There is  borderline dilatation of the aortic root, measuring 38 mm. There is borderline dilatation of the ascending aorta, measuring 38 mm.  8. The inferior vena cava is dilated in size with >50% respiratory variability, suggesting right atrial pressure of 8 mmHg. FINDINGS  Left Ventricle: Left ventricular ejection fraction, by estimation, is 20 to 25%. The left ventricle has severely decreased function. The left ventricle demonstrates global hypokinesis. The left ventricular internal cavity size was normal in size. There is moderate concentric left ventricular hypertrophy. Left ventricular diastolic parameters are indeterminate. Right Ventricle: The right ventricular size is normal. No increase in right ventricular wall thickness. Right ventricular systolic function is normal. Left Atrium: Left atrial size was mildly dilated. Right Atrium: Right atrial size was normal in size. Pericardium: There is a small circumfrential pericardial effusion with a moderate focal collection abutting the RA. There is mild invagination of the RA but no overt tamponade. A small pericardial effusion is present. The pericardial effusion is circumferential and localized near the right atrium. There is no evidence of cardiac tamponade. Mitral Valve: The mitral valve is normal in structure. Trivial mitral valve regurgitation. No evidence of mitral valve stenosis. Tricuspid Valve: The tricuspid valve is normal in structure. Tricuspid valve regurgitation is trivial. No evidence of tricuspid stenosis. Aortic Valve: The aortic valve is tricuspid. There is mild calcification of the aortic valve. Aortic valve regurgitation is mild. Aortic valve sclerosis/calcification is present, without any evidence of aortic stenosis. Pulmonic Valve: The pulmonic valve was normal in structure. Pulmonic valve regurgitation is trivial. No evidence of pulmonic stenosis. Aorta: Aortic dilatation noted. There is borderline dilatation of the aortic root, measuring 38 mm.  There is borderline dilatation of the ascending aorta, measuring 38 mm. Venous: The inferior vena cava is dilated in size with greater than 50% respiratory variability, suggesting right atrial pressure of 8 mmHg. IAS/Shunts: No atrial level shunt detected by color flow Doppler.  LEFT VENTRICLE PLAX 2D LVIDd:         4.70 cm      Diastology LVIDs:         4.10 cm      LV e' medial:    4.29 cm/s LV PW:         1.40 cm      LV E/e' medial:  27.0 LV IVS:        1.40 cm      LV e' lateral:   5.08 cm/s LVOT diam:     2.10 cm      LV E/e' lateral: 22.8 LV SV:         70 LV SV Index:   32 LVOT Area:     3.46 cm  LV Volumes (MOD) LV vol d, MOD A2C: 127.0 ml LV vol d, MOD A4C: 134.0 ml LV vol s, MOD A2C: 92.8 ml LV vol s, MOD A4C: 88.9 ml LV SV MOD A2C:     34.2 ml LV SV MOD A4C:     134.0 ml LV SV MOD BP:      41.2 ml RIGHT VENTRICLE RV Basal diam:  3.40 cm RV S prime:     13.10 cm/s TAPSE (M-mode): 3.1 cm LEFT ATRIUM             Index        RIGHT ATRIUM           Index LA diam:        3.40 cm 1.53 cm/m   RA Area:  18.00 cm LA Vol (A2C):   76.0 ml 34.28 ml/m  RA Volume:   47.90 ml  21.61 ml/m LA Vol (A4C):   52.0 ml 23.46 ml/m LA Biplane Vol: 70.1 ml 31.62 ml/m  AORTIC VALVE LVOT Vmax:   110.00 cm/s LVOT Vmean:  76.400 cm/s LVOT VTI:    0.203 m  AORTA Ao Root diam: 3.80 cm Ao Asc diam:  3.80 cm MITRAL VALVE MV Area (PHT): 8.43 cm     SHUNTS MV Decel Time: 90 msec      Systemic VTI:  0.20 m MV E velocity: 116.00 cm/s  Systemic Diam: 2.10 cm MV A velocity: 60.00 cm/s MV E/A ratio:  1.93 Toribio Fuel MD Electronically signed by Toribio Fuel MD Signature Date/Time: 05/06/2024/12:35:30 PM    Final    CT Chest W Contrast Addendum Date: 05/06/2024 ADDENDUM REPORT: 05/06/2024 10:11 ADDENDUM: The original report was by Dr. Ryan Salvage. The following addendum is by Dr. Ryan Salvage: These results were called by telephone at the time of interpretation on 05/06/2024 at 10:02 am to provider [facility] [place] [place]  , who verbally acknowledged these results. Electronically Signed   By: Ryan Salvage M.D.   On: 05/06/2024 10:11   Result Date: 05/06/2024 CLINICAL DATA:  Left upper lobe non-small cell lung cancer, hypoxia and shortness of breath. * Tracking Code: BO * EXAM: CT CHEST WITH CONTRAST TECHNIQUE: Multidetector CT imaging of the chest was performed during intravenous contrast administration. RADIATION DOSE REDUCTION: This exam was performed according to the departmental dose-optimization program which includes automated exposure control, adjustment of the mA and/or kV according to patient size and/or use of iterative reconstruction technique. CONTRAST:  75mL OMNIPAQUE  IOHEXOL  300 MG/ML  SOLN COMPARISON:  01/16/2024 FINDINGS: Cardiovascular: Coronary, aortic arch, and branch vessel atherosclerotic vascular disease. Large and substantially increased pericardial effusion. Mediastinum/Nodes: Borderline prominence of right infrahilar nodal tissue. Lungs/Pleura: Moderate left and small right pleural effusions with passive atelectasis. Centrilobular emphysema. Hazy airspace opacity in the aerated portion of the left lower lobe for example on image 79 series 6, suspicious for pneumonia or pneumonitis. Residuum of the left upper lobe mass measures proximally 4.0 by 1.5 cm, previously 4.5 by 1.9 cm on 01/16/2024. Airway plugging of the apicoposterior segment left upper lobe. Right upper lobe subpleural nodule 0.6 cm in diameter on image 82 series 6, stable. Upper Abdomen: Left adrenal nodule 2.1 by 1.7 cm, stable by my measurements, indeterminate for malignancy versus benign etiology. Musculoskeletal: Unremarkable IMPRESSION: 1. Large and substantially increased pericardial effusion compared to 01/16/2024. Correlate with physical exam indicators of tamponade in deciding whether echocardiographic workup is indicated. 2. Moderate left and small right pleural effusions with passive atelectasis. 3. Hazy airspace opacity in the  aerated portion of the left lower lobe suspicious for pneumonia or pneumonitis. 4. Residuum of the left upper lobe mass measures 4.0 by 1.5 cm, previously 4.5 by 1.9 cm on 01/16/2024. 5. Airway plugging of the apicoposterior segment left upper lobe. 6. Stable 0.6 cm right upper lobe subpleural nodule. 7. Stable left adrenal nodule, indeterminate for malignancy versus benign etiology (although chronicity dating back to 05/03/2010 tends to favor benign etiology). 8. Aortic Atherosclerosis (ICD10-I70.0) and Emphysema (ICD10-J43.9). Radiology assistant personnel have been notified to put me in telephone contact with the referring physician or the referring physician's clinical representative in order to discuss these findings. Once this communication is established I will issue an addendum to this report for documentation purposes. Electronically Signed: By: Ryan Salvage M.D. On: 05/06/2024  09:52    Cardiac Studies  Echo 05/06/24:  1. Left ventricular ejection fraction, by estimation, is 20 to 25%. The  left ventricle has severely decreased function. The left ventricle  demonstrates global hypokinesis. There is moderate concentric left  ventricular hypertrophy. Left ventricular  diastolic parameters are indeterminate.   2. Right ventricular systolic function is normal. The right ventricular  size is normal.   3. Left atrial size was mildly dilated.   4. There is a small circumfrential pericardial effusion with a moderate  focal collection abutting the RA. There is mild invagination of the RA but  no overt tamponade. a small pericardial effusion is present. The  pericardial effusion is circumferential  and localized near the right atrium. There is no evidence of cardiac  tamponade.   5. The mitral valve is normal in structure. Trivial mitral valve  regurgitation. No evidence of mitral stenosis.   6. The aortic valve is tricuspid. There is mild calcification of the  aortic valve. Aortic valve  regurgitation is mild. Aortic valve  sclerosis/calcification is present, without any evidence of aortic  stenosis.   7. Aortic dilatation noted. There is borderline dilatation of the aortic  root, measuring 38 mm. There is borderline dilatation of the ascending  aorta, measuring 38 mm.   8. The inferior vena cava is dilated in size with >50% respiratory  variability, suggesting right atrial pressure of 8 mmHg.   Patient Profile   88 y.o. male with stage 3 NSCLC, COPD on O2, CAD with prior PCI to RCA and LAD 12/2021, and historically preserved LVEF on echo. He presented this admission with SOB found to have moderate pericardial effusion and newly reduced LVEF of 20-25%.   Assessment & Plan   Pericardial effusion - small on echo       {Are we signing off today?:[id number]} For questions or updates, please contact [facility] Please consult [email or web address] for contact info under     Signed, Jon Nat Hails, PA  05/08/2024, 7:51 AM

## 2024-05-08 NOTE — Progress Notes (Signed)
 Discharge meds in a secure bag delivered to pt in room by this RN

## 2024-05-08 NOTE — Discharge Summary (Signed)
 Physician Discharge Summary   Patient: Jonathan [name]. MRN: [id number] DOB: February 15, 1936  Admit date:     05/06/2024  Discharge date: 05/08/24  Discharge Physician: Garnette Pelt   PCP: [name], MD   Recommendations at discharge:    Follow up with PCP in 1-2 weeks Follow up with Cardiology as scheduled Follow up with Pulmonary as scheduled  Discharge Diagnoses: Principal Problem:   Pericardial effusion Active Problems:   Acute on chronic combined systolic and diastolic CHF (congestive heart failure) (HCC)   Essential hypertension   Hyperlipidemia   CAD (coronary artery disease)   PVD (peripheral vascular disease) (HCC)   Anxiety and depression   CKD stage 3b, GFR 30-44 ml/min (HCC)   Squamous cell carcinoma of upper lobe of left lung (HCC)   Acute on chronic respiratory failure with hypoxia (HCC)   Pleural effusion   COPD with acute exacerbation (HCC)   Abnormal chest CT   CHF exacerbation (HCC)  Resolved Problems:   * No resolved hospital problems. *  Hospital Course: 88 y.o. male with medical history significant of anxiety, depression, CAD, chronic combined systolic and diastolic heart failure, history of coronary stents, colon polyps, type 2 diabetes, hyperlipidemia, hypertension, hypogonadism, peripheral vascular disease, COPD, chronic respiratory failure with hypoxia on home oxygen  at 2 LPM who presented to the emergency department complaints of dyspnea and hypoxia at home.  He has also had wheezing and an occasional nonproductive cough. He denied fever, chills, rhinorrhea, sore throat or hemoptysis.  No chest pain, palpitations, diaphoresis, PND, orthopnea or recent pitting edema of the lower extremities.  No abdominal pain, nausea, emesis, diarrhea, constipation, melena or hematochezia.  No flank pain, dysuria, frequency or hematuria.  No polyuria, polydipsia, polyphagia or blurred vision.   Assessment and Plan: Principal Problem:   Acute on chronic respiratory  failure with hypoxia (HCC) In the setting of:     COPD with acute exacerbation (HCC) And:    Acute on chronic combined systolic  and diastolic CHF (congestive heart failure) (HCC) With associated:   Pericardial effusion And:   Pleural effusion -Had been continued on IV lasix   -Cardiology following, later transitioned to PO lasix  -Now on jardiance , Toprol , entresto  and low dose aldactone   -Pt to f/u with Cardiology   Active Problems:   Abnormal chest CT  Questionable pneumonia versus pneumonitis. Was given empiric CAP antibiotics. Procal is <0.1, thus abx discontinued -afebrile, no leukocytosis     CAD (coronary artery disease)   PVD (peripheral vascular disease) (HCC) Continue beta-blocker, clopidogrel  and statin.     Essential hypertension  Continue furosemide  Entresto  and metoprolol  as above.     CKD stage 3b, GFR 30-44 ml/min (HCC) Normal renal function electrolytes.     Hyperlipidemia Continue simvastatin  20 mg p.o. daily.     Anxiety and depression Continue hydroxyzine  at bedtime. Patient also on tamsulosin , but no notes from urology. No BPH or another prostate related diagnosis in problem list.     Squamous cell carcinoma of upper lobe of left lung (HCC) Mass decreasing in size. Follow-up with oncology as scheduled.    Consultants: Cardiology Procedures performed:   Disposition: Home Diet recommendation:  Cardiac and Carb modified diet DISCHARGE MEDICATION: Allergies as of 05/08/2024   No Known Allergies      Medication List     STOP taking these medications    amLODipine  5 MG tablet Commonly known as: NORVASC    Insulin  Pen Needle 32G X 4 MM Misc   Lantus   SoloStar 100 UNIT/ML Solostar Pen Generic drug: insulin  glargine   metoprolol  200 MG 24 hr tablet Commonly known as: TOPROL -XL   sucralfate  1 g tablet Commonly known as: Carafate    Trulicity  3 MG/0.5ML Soaj Generic drug: Dulaglutide        TAKE these medications    albuterol  108  (90 Base) MCG/ACT inhaler Commonly known as: VENTOLIN  HFA Inhale 2 puffs into the lungs every 6 (six) hours as needed for wheezing or shortness of breath.   benzonatate  200 MG capsule Commonly known as: TESSALON  Take 1 capsule (200 mg total) by mouth 3 (three) times daily as needed for cough.   clopidogrel  75 MG tablet Commonly known as: PLAVIX  Take 1 tablet (75 mg total) by mouth daily with breakfast.   empagliflozin  25 MG Tabs tablet Commonly known as: Jardiance  Take 1 tablet (25 mg total) by mouth daily before breakfast. For diabetes E11.65 and E 11.29   Entresto  24-26 MG Generic drug: sacubitril -valsartan  Take 1 tablet by mouth 2 (two) times daily.   FreeStyle Freedom Lite w/Device Kit Use Freestyle Freedom Lite meter to check blood sugar twice daily. DX:E11.65   FREESTYLE LITE test strip Generic drug: glucose blood USE AS INSTRUCTED   furosemide  20 MG tablet Commonly known as: LASIX  Take 1 tablet (20 mg total) by mouth daily. Start taking on: May 09, 2024 What changed:  medication strength how much to take   gabapentin  300 MG capsule Commonly known as: NEURONTIN  Take 300 mg by mouth 2 (two) times daily.   hydrOXYzine  25 MG tablet Commonly known as: ATARAX  Take 1 tablet by mouth at bedtime as needed for anxiety.   metFORMIN  500 MG tablet Commonly known as: GLUCOPHAGE  Take 1 tablet (500 mg total) by mouth 2 (two) times daily with a meal. What changed:  how much to take additional instructions   metoprolol  tartrate 100 MG tablet Commonly known as: LOPRESSOR  Take 1 tablet (100 mg total) by mouth 2 (two) times daily.   multivitamin with minerals Tabs tablet Take 1 tablet by mouth daily.   OXYGEN  Inhale 2 L/min into the lungs continuous.   simvastatin  20 MG tablet Commonly known as: ZOCOR  Take 20 mg by mouth daily.   spironolactone  25 MG tablet Commonly known as: ALDACTONE  Take 0.5 tablets (12.5 mg total) by mouth daily. Start taking on: May 09, 2024   Stiolto Respimat  2.5-2.5 MCG/ACT Aers Generic drug: Tiotropium Bromide -Olodaterol Inhale 2 puffs into the lungs daily.   tamsulosin  0.4 MG Caps capsule Commonly known as: FLOMAX  Take 1 capsule (0.4 mg total) by mouth daily after supper.        Follow-up Information     [name], MD Follow up in 2 week(s).   Specialty: Internal Medicine Why: Hospital follow up Contact information: [address] [place] [address] [phone]         Neysa Reggy BIRCH, MD Follow up.   Specialty: Pulmonary Disease Why: Hospital follow up Contact information: [address] [place] [address] [phone]         Court Dorn PARAS, MD Follow up.   Specialties: Cardiology, Radiology Why: Hospital follow up, as will be scheduled Contact information: [address] [place] [address] [phone]                Discharge Exam: Filed Weights   05/06/24 1242 05/06/24 2017 05/07/24 0857  Weight: 90.1 kg 91.3 kg 91.6 kg   General exam: Awake, laying in bed,  in nad Respiratory system: Normal respiratory effort, no wheezing Cardiovascular system: regular rate, s1, s2 Gastrointestinal system: Soft, nondistended, positive BS Central nervous system: CN2-12 grossly intact, strength intact Extremities: Perfused, no clubbing Skin: Normal skin turgor, no notable skin lesions seen Psychiatry: Mood normal // no visual hallucinations   Condition at discharge: fair  The results of significant diagnostics from this hospitalization (including imaging, microbiology, ancillary and laboratory) are listed below for reference.   Imaging Studies: DG Chest Port 1 View Result Date: 05/06/2024 CLINICAL DATA:  Shortness of breath, decreased O2 sats. EXAM: PORTABLE CHEST 1 VIEW COMPARISON:  05/06/2024 and CT chest 05/06/2024. FINDINGS: Trachea is midline. Heart is enlarged. Masslike consolidation in the perihilar left upper lobe, better evaluated  on CT chest 05/06/2024. Similar left basilar coarsened airspace opacification. Minimal coarsening in the right lung base. Small bilateral pleural effusions. IMPRESSION: 1. Masslike collapse/consolidation in the left upper lobe with presumed changes of radiation therapy in the adjacent left lung, as before. Difficult to exclude superimposed pneumonia. 2. Small bilateral pleural effusions. Electronically Signed   By: Newell Eke M.D.   On: 05/06/2024 17:22   ECHOCARDIOGRAM COMPLETE Result Date: 05/06/2024    ECHOCARDIOGRAM REPORT   Patient Name:   Jonathan [name]. Date of Exam: 05/06/2024 Medical Rec #:  [id number]           Height:       74.0 in Accession #:    [id number]          Weight:       209.5 lb Date of Birth:  1936-05-25            BSA:          2.217 m Patient Age:    88 years            BP:           143/85 mmHg Patient Gender: M                   HR:           91 bpm. Exam Location:  Inpatient Procedure: 2D Echo, Cardiac Doppler and Color Doppler (Both Spectral and Color            Flow Doppler were utilized during procedure). Indications:    Pericardial Effusion  History:        Patient has prior history of Echocardiogram examinations.                 Pericardial Disease; Risk Factors:Hypertension.  Sonographer:    Vella Key Referring Phys: [id number] PETER C NISHAN IMPRESSIONS  1. Left ventricular ejection fraction, by estimation, is 20 to 25%. The left ventricle has severely decreased function. The left ventricle demonstrates global hypokinesis. There is moderate concentric left ventricular hypertrophy. Left ventricular diastolic parameters are indeterminate.  2. Right ventricular systolic function is normal. The right ventricular size is normal.  3. Left atrial size was mildly dilated.  4. There is a small circumfrential pericardial effusion with a moderate focal collection abutting the RA. There is mild invagination of the RA but no overt tamponade. a small pericardial effusion is present. The  pericardial effusion is circumferential and localized near the right atrium. There is no evidence of cardiac tamponade.  5. The mitral valve is normal in structure. Trivial mitral valve regurgitation. No evidence of mitral stenosis.  6. The aortic valve is tricuspid. There is mild calcification of the aortic valve. Aortic valve regurgitation is mild.  Aortic valve sclerosis/calcification is present, without any evidence of aortic stenosis.  7. Aortic dilatation noted. There is borderline dilatation of the aortic root, measuring 38 mm. There is borderline dilatation of the ascending aorta, measuring 38 mm.  8. The inferior vena cava is dilated in size with >50% respiratory variability, suggesting right atrial pressure of 8 mmHg. FINDINGS  Left Ventricle: Left ventricular ejection fraction, by estimation, is 20 to 25%. The left ventricle has severely decreased function. The left ventricle demonstrates global hypokinesis. The left ventricular internal cavity size was normal in size. There is moderate concentric left ventricular hypertrophy. Left ventricular diastolic parameters are indeterminate. Right Ventricle: The right ventricular size is normal. No increase in right ventricular wall thickness. Right ventricular systolic function is normal. Left Atrium: Left atrial size was mildly dilated. Right Atrium: Right atrial size was normal in size. Pericardium: There is a small circumfrential pericardial effusion with a moderate focal collection abutting the RA. There is mild invagination of the RA but no overt tamponade. A small pericardial effusion is present. The pericardial effusion is circumferential and localized near the right atrium. There is no evidence of cardiac tamponade. Mitral Valve: The mitral valve is normal in structure. Trivial mitral valve regurgitation. No evidence of mitral valve stenosis. Tricuspid Valve: The tricuspid valve is normal in structure. Tricuspid valve regurgitation is trivial. No evidence  of tricuspid stenosis. Aortic Valve: The aortic valve is tricuspid. There is mild calcification of the aortic valve. Aortic valve regurgitation is mild. Aortic valve sclerosis/calcification is present, without any evidence of aortic stenosis. Pulmonic Valve: The pulmonic valve was normal in structure. Pulmonic valve regurgitation is trivial. No evidence of pulmonic stenosis. Aorta: Aortic dilatation noted. There is borderline dilatation of the aortic root, measuring 38 mm. There is borderline dilatation of the ascending aorta, measuring 38 mm. Venous: The inferior vena cava is dilated in size with greater than 50% respiratory variability, suggesting right atrial pressure of 8 mmHg. IAS/Shunts: No atrial level shunt detected by color flow Doppler.  LEFT VENTRICLE PLAX 2D LVIDd:         4.70 cm      Diastology LVIDs:         4.10 cm      LV e' medial:    4.29 cm/s LV PW:         1.40 cm      LV E/e' medial:  27.0 LV IVS:        1.40 cm      LV e' lateral:   5.08 cm/s LVOT diam:     2.10 cm      LV E/e' lateral: 22.8 LV SV:         70 LV SV Index:   32 LVOT Area:     3.46 cm  LV Volumes (MOD) LV vol d, MOD A2C: 127.0 ml LV vol d, MOD A4C: 134.0 ml LV vol s, MOD A2C: 92.8 ml LV vol s, MOD A4C: 88.9 ml LV SV MOD A2C:     34.2 ml LV SV MOD A4C:     134.0 ml LV SV MOD BP:      41.2 ml RIGHT VENTRICLE RV Basal diam:  3.40 cm RV S prime:     13.10 cm/s TAPSE (M-mode): 3.1 cm LEFT ATRIUM             Index        RIGHT ATRIUM           Index LA diam:  3.40 cm 1.53 cm/m   RA Area:     18.00 cm LA Vol (A2C):   76.0 ml 34.28 ml/m  RA Volume:   47.90 ml  21.61 ml/m LA Vol (A4C):   52.0 ml 23.46 ml/m LA Biplane Vol: 70.1 ml 31.62 ml/m  AORTIC VALVE LVOT Vmax:   110.00 cm/s LVOT Vmean:  76.400 cm/s LVOT VTI:    0.203 m  AORTA Ao Root diam: 3.80 cm Ao Asc diam:  3.80 cm MITRAL VALVE MV Area (PHT): 8.43 cm     SHUNTS MV Decel Time: 90 msec      Systemic VTI:  0.20 m MV E velocity: 116.00 cm/s  Systemic Diam: 2.10 cm MV A  velocity: 60.00 cm/s MV E/A ratio:  1.93 Toribio Fuel MD Electronically signed by Toribio Fuel MD Signature Date/Time: 05/06/2024/12:35:30 PM    Final    CT Chest W Contrast Addendum Date: 05/06/2024 ADDENDUM REPORT: 05/06/2024 10:11 ADDENDUM: The original report was by Dr. Ryan Salvage. The following addendum is by Dr. Ryan Salvage: These results were called by telephone at the time of interpretation on 05/06/2024 at 10:02 am to provider [facility] , who verbally acknowledged these results. Electronically Signed   By: Ryan Salvage M.D.   On: 05/06/2024 10:11   Result Date: 05/06/2024 CLINICAL DATA:  Left upper lobe non-small cell lung cancer, hypoxia and shortness of breath. * Tracking Code: BO * EXAM: CT CHEST WITH CONTRAST TECHNIQUE: Multidetector CT imaging of the chest was performed during intravenous contrast administration. RADIATION DOSE REDUCTION: This exam was performed according to the departmental dose-optimization program which includes automated exposure control, adjustment of the mA and/or kV according to patient size and/or use of iterative reconstruction technique. CONTRAST:  75mL OMNIPAQUE  IOHEXOL  300 MG/ML  SOLN COMPARISON:  01/16/2024 FINDINGS: Cardiovascular: Coronary, aortic arch, and branch vessel atherosclerotic vascular disease. Large and substantially increased pericardial effusion. Mediastinum/Nodes: Borderline prominence of right infrahilar nodal tissue. Lungs/Pleura: Moderate left and small right pleural effusions with passive atelectasis. Centrilobular emphysema. Hazy airspace opacity in the aerated portion of the left lower lobe for example on image 79 series 6, suspicious for pneumonia or pneumonitis. Residuum of the left upper lobe mass measures proximally 4.0 by 1.5 cm, previously 4.5 by 1.9 cm on 01/16/2024. Airway plugging of the apicoposterior segment left upper lobe. Right upper lobe subpleural nodule 0.6 cm in diameter on image 82 series 6, stable.  Upper Abdomen: Left adrenal nodule 2.1 by 1.7 cm, stable by my measurements, indeterminate for malignancy versus benign etiology. Musculoskeletal: Unremarkable IMPRESSION: 1. Large and substantially increased pericardial effusion compared to 01/16/2024. Correlate with physical exam indicators of tamponade in deciding whether echocardiographic workup is indicated. 2. Moderate left and small right pleural effusions with passive atelectasis. 3. Hazy airspace opacity in the aerated portion of the left lower lobe suspicious for pneumonia or pneumonitis. 4. Residuum of the left upper lobe mass measures 4.0 by 1.5 cm, previously 4.5 by 1.9 cm on 01/16/2024. 5. Airway plugging of the apicoposterior segment left upper lobe. 6. Stable 0.6 cm right upper lobe subpleural nodule. 7. Stable left adrenal nodule, indeterminate for malignancy versus benign etiology (although chronicity dating back to 05/03/2010 tends to favor benign etiology). 8. Aortic Atherosclerosis (ICD10-I70.0) and Emphysema (ICD10-J43.9). Radiology assistant personnel have been notified to put me in telephone contact with the referring physician or the referring physician's clinical representative in order to discuss these findings. Once this communication is established I will issue an addendum to this report  for documentation purposes. Electronically Signed: By: Ryan Salvage M.D. On: 05/06/2024 09:52   DG Chest Port 1 View Result Date: 05/06/2024 CLINICAL DATA:  Shortness of breath. History of non-small-cell lung cancer. EXAM: PORTABLE CHEST 1 VIEW COMPARISON:  02/26/2024 FINDINGS: The cardio pericardial silhouette is enlarged. Interstitial markings are diffusely coarsened with chronic features. Hazy left parahilar opacity is similar to prior. The lungs are clear without focal pneumonia, edema, pneumothorax or pleural effusion. No acute bony abnormality. Telemetry leads overlie the chest. IMPRESSION: Stable. Chronic interstitial coarsening without new or  acute cardiopulmonary findings. Similar appearance hazy left parahilar opacity, region of patient's known lesion. Electronically Signed   By: Camellia Candle M.D.   On: 05/06/2024 07:26    Microbiology: Results for orders placed or performed during the hospital encounter of 11/11/23  Resp panel by RT-PCR (RSV, Flu A&B, Covid) Anterior Nasal Swab     Status: None   Collection Time: 11/11/23  1:44 PM   Specimen: Anterior Nasal Swab  Result Value Ref Range Status   SARS Coronavirus 2 by RT PCR NEGATIVE NEGATIVE Final    Comment: (NOTE) SARS-CoV-2 target nucleic acids are NOT DETECTED.  The SARS-CoV-2 RNA is generally detectable in upper respiratory specimens during the acute phase of infection. The lowest concentration of SARS-CoV-2 viral copies this assay can detect is 138 copies/mL. A negative result does not preclude SARS-Cov-2 infection and should not be used as the sole basis for treatment or other patient management decisions. A negative result may occur with  improper specimen collection/handling, submission of specimen other than nasopharyngeal swab, presence of viral mutation(s) within the areas targeted by this assay, and inadequate number of viral copies(<138 copies/mL). A negative result must be combined with clinical observations, patient history, and epidemiological information. The expected result is Negative.  Fact Sheet for Patients:  BloggerCourse.com  Fact Sheet for Healthcare Providers:  SeriousBroker.it  This test is no t yet approved or cleared by the [place]  FDA and  has been authorized for detection and/or diagnosis of SARS-CoV-2 by FDA under an Emergency Use Authorization (EUA). This EUA will remain  in effect (meaning this test can be used) for the duration of the COVID-19 declaration under Section 564(b)(1) of the Act, 21 U.S.C.section 360bbb-3(b)(1), unless the authorization is terminated  or revoked  sooner.       Influenza A by PCR NEGATIVE NEGATIVE Final   Influenza B by PCR NEGATIVE NEGATIVE Final    Comment: (NOTE) The Xpert Xpress SARS-CoV-2/FLU/RSV plus assay is intended as an aid in the diagnosis of influenza from Nasopharyngeal swab specimens and should not be used as a sole basis for treatment. Nasal washings and aspirates are unacceptable for Xpert Xpress SARS-CoV-2/FLU/RSV testing.  Fact Sheet for Patients: BloggerCourse.com  Fact Sheet for Healthcare Providers: SeriousBroker.it  This test is not yet approved or cleared by the [place]  FDA and has been authorized for detection and/or diagnosis of SARS-CoV-2 by FDA under an Emergency Use Authorization (EUA). This EUA will remain in effect (meaning this test can be used) for the duration of the COVID-19 declaration under Section 564(b)(1) of the Act, 21 U.S.C. section 360bbb-3(b)(1), unless the authorization is terminated or revoked.     Resp Syncytial Virus by PCR NEGATIVE NEGATIVE Final    Comment: (NOTE) Fact Sheet for Patients: BloggerCourse.com  Fact Sheet for Healthcare Providers: SeriousBroker.it  This test is not yet approved or cleared by the [place]  FDA and has been authorized for detection and/or diagnosis of SARS-CoV-2  by FDA under an Emergency Use Authorization (EUA). This EUA will remain in effect (meaning this test can be used) for the duration of the COVID-19 declaration under Section 564(b)(1) of the Act, 21 U.S.C. section 360bbb-3(b)(1), unless the authorization is terminated or revoked.  Performed at [facility], [address] [place], [place] [address]   Respiratory (~20 pathogens) panel by PCR     Status: None   Collection Time: 11/11/23  1:44 PM   Specimen: Nasopharyngeal Swab; Respiratory  Result Value Ref Range Status   Adenovirus NOT DETECTED NOT  DETECTED Final   Coronavirus 229E NOT DETECTED NOT DETECTED Final    Comment: (NOTE) The Coronavirus on the Respiratory Panel, DOES NOT test for the novel  Coronavirus (2019 nCoV)    Coronavirus HKU1 NOT DETECTED NOT DETECTED Final   Coronavirus NL63 NOT DETECTED NOT DETECTED Final   Coronavirus OC43 NOT DETECTED NOT DETECTED Final   Metapneumovirus NOT DETECTED NOT DETECTED Final   Rhinovirus / Enterovirus NOT DETECTED NOT DETECTED Final   Influenza A NOT DETECTED NOT DETECTED Final   Influenza B NOT DETECTED NOT DETECTED Final   Parainfluenza Virus 1 NOT DETECTED NOT DETECTED Final   Parainfluenza Virus 2 NOT DETECTED NOT DETECTED Final   Parainfluenza Virus 3 NOT DETECTED NOT DETECTED Final   Parainfluenza Virus 4 NOT DETECTED NOT DETECTED Final   Respiratory Syncytial Virus NOT DETECTED NOT DETECTED Final   Bordetella pertussis NOT DETECTED NOT DETECTED Final   Bordetella Parapertussis NOT DETECTED NOT DETECTED Final   Chlamydophila pneumoniae NOT DETECTED NOT DETECTED Final   Mycoplasma pneumoniae NOT DETECTED NOT DETECTED Final    Comment: Performed at [facility], [address] [place], [place] [address]   *Note: Due to a large number of results and/or encounters for the requested time period, some results have not been displayed. A complete set of results can be found in Results Review.    Labs: CBC: Recent Labs  Lab 05/06/24 0647 05/07/24 0617  WBC 7.9 6.6  NEUTROABS 4.6  --   HGB 16.0 14.8  HCT 48.8 45.0  MCV 98.4 98.3  PLT 208 222   Basic Metabolic Panel: Recent Labs  Lab 05/06/24 0647 05/07/24 0617 05/08/24 0605  NA 137 139 138  K 3.6 3.7 3.5  CL 101 106 108  CO2 23 22 22   GLUCOSE 144* 189* 121*  BUN 23 26* 34*  CREATININE 1.37* 1.42* 1.31*  CALCIUM 8.9 8.7* 8.3*  MG 2.4  --   --   PHOS 3.5  --   --    Liver Function Tests: Recent Labs  Lab 05/06/24 0647 05/07/24 0617  AST 22 13*  ALT 15 14  ALKPHOS 71 59  BILITOT 0.9 0.7  PROT  7.5 6.7  ALBUMIN 3.5 3.1*   CBG: Recent Labs  Lab 05/07/24 0738 05/07/24 1155 05/07/24 1615 05/07/24 2140 05/08/24 0846  GLUCAP 162* 124* 212* 174* 149*    Discharge time spent: less than 30 minutes.  Signed: Garnette Pelt, MD [facility] 05/08/2024

## 2024-05-08 NOTE — Progress Notes (Signed)
 Cardiologist:  Court  Subjective:   Breathing better Ready for d/c    Objective:  Vitals:   05/07/24 1824 05/07/24 2122 05/08/24 0541 05/08/24 0747  BP:  123/76 109/69   Pulse:  86 71   Resp: (!) 21 19 20    Temp:  98.4 F (36.9 C) 98 F (36.7 C)   TempSrc:  Oral    SpO2: 97% 96% 100% (S) (!) 87%  Weight:      Height:        Intake/Output from previous day:  Intake/Output Summary (Last 24 hours) at 05/08/2024 1003 Last data filed at 05/08/2024 0900 Gross per 24 hour  Intake 1250.09 ml  Output 1725 ml  Net -474.91 ml    Physical Exam:  Lungs now clear less wheezing  JVP normal  No Kussmaul sing Abdomen benign No murmur  Plus one edema   Lab Results: Basic Metabolic Panel: Recent Labs    05/06/24 0647 05/07/24 0617 05/08/24 0605  NA 137 139 138  K 3.6 3.7 3.5  CL 101 106 108  CO2 23 22 22   GLUCOSE 144* 189* 121*  BUN 23 26* 34*  CREATININE 1.37* 1.42* 1.31*  CALCIUM 8.9 8.7* 8.3*  MG 2.4  --   --   PHOS 3.5  --   --    Liver Function Tests: Recent Labs    05/06/24 0647 05/07/24 0617  AST 22 13*  ALT 15 14  ALKPHOS 71 59  BILITOT 0.9 0.7  PROT 7.5 6.7  ALBUMIN 3.5 3.1*   No results for input(s): LIPASE, AMYLASE in the last 72 hours. CBC: Recent Labs    05/06/24 0647 05/07/24 0617  WBC 7.9 6.6  NEUTROABS 4.6  --   HGB 16.0 14.8  HCT 48.8 45.0  MCV 98.4 98.3  PLT 208 222    Imaging: DG Chest Port 1 View Result Date: 05/06/2024 CLINICAL DATA:  Shortness of breath, decreased O2 sats. EXAM: PORTABLE CHEST 1 VIEW COMPARISON:  05/06/2024 and CT chest 05/06/2024. FINDINGS: Trachea is midline. Heart is enlarged. Masslike consolidation in the perihilar left upper lobe, better evaluated on CT chest 05/06/2024. Similar left basilar coarsened airspace opacification. Minimal coarsening in the right lung base. Small bilateral pleural effusions. IMPRESSION: 1. Masslike collapse/consolidation in the left upper lobe with presumed changes of  radiation therapy in the adjacent left lung, as before. Difficult to exclude superimposed pneumonia. 2. Small bilateral pleural effusions. Electronically Signed   By: Newell Eke M.D.   On: 05/06/2024 17:22   ECHOCARDIOGRAM COMPLETE Result Date: 05/06/2024    ECHOCARDIOGRAM REPORT   Patient Name:   Jonathan [name]. Date of Exam: 05/06/2024 Medical Rec #:  [id number]           Height:       74.0 in Accession #:    [id number]          Weight:       209.5 lb Date of Birth:  [birth year]            BSA:          2.217 m Patient Age:    88 years            BP:           143/85 mmHg Patient Gender: M                   HR:           91 bpm. Exam Location:  Inpatient Procedure: 2D Echo, Cardiac Doppler and Color Doppler (Both Spectral and Color            Flow Doppler were utilized during procedure). Indications:    Pericardial Effusion  History:        Patient has prior history of Echocardiogram examinations.                 Pericardial Disease; Risk Factors:Hypertension.  Sonographer:    Vella Key Referring Phys: [id number] Pernell Lenoir C Williette Loewe IMPRESSIONS  1. Left ventricular ejection fraction, by estimation, is 20 to 25%. The left ventricle has severely decreased function. The left ventricle demonstrates global hypokinesis. There is moderate concentric left ventricular hypertrophy. Left ventricular diastolic parameters are indeterminate.  2. Right ventricular systolic function is normal. The right ventricular size is normal.  3. Left atrial size was mildly dilated.  4. There is a small circumfrential pericardial effusion with a moderate focal collection abutting the RA. There is mild invagination of the RA but no overt tamponade. a small pericardial effusion is present. The pericardial effusion is circumferential and localized near the right atrium. There is no evidence of cardiac tamponade.  5. The mitral valve is normal in structure. Trivial mitral valve regurgitation. No evidence of mitral stenosis.  6. The aortic valve is  tricuspid. There is mild calcification of the aortic valve. Aortic valve regurgitation is mild. Aortic valve sclerosis/calcification is present, without any evidence of aortic stenosis.  7. Aortic dilatation noted. There is borderline dilatation of the aortic root, measuring 38 mm. There is borderline dilatation of the ascending aorta, measuring 38 mm.  8. The inferior vena cava is dilated in size with >50% respiratory variability, suggesting right atrial pressure of 8 mmHg. FINDINGS  Left Ventricle: Left ventricular ejection fraction, by estimation, is 20 to 25%. The left ventricle has severely decreased function. The left ventricle demonstrates global hypokinesis. The left ventricular internal cavity size was normal in size. There is moderate concentric left ventricular hypertrophy. Left ventricular diastolic parameters are indeterminate. Right Ventricle: The right ventricular size is normal. No increase in right ventricular wall thickness. Right ventricular systolic function is normal. Left Atrium: Left atrial size was mildly dilated. Right Atrium: Right atrial size was normal in size. Pericardium: There is a small circumfrential pericardial effusion with a moderate focal collection abutting the RA. There is mild invagination of the RA but no overt tamponade. A small pericardial effusion is present. The pericardial effusion is circumferential and localized near the right atrium. There is no evidence of cardiac tamponade. Mitral Valve: The mitral valve is normal in structure. Trivial mitral valve regurgitation. No evidence of mitral valve stenosis. Tricuspid Valve: The tricuspid valve is normal in structure. Tricuspid valve regurgitation is trivial. No evidence of tricuspid stenosis. Aortic Valve: The aortic valve is tricuspid. There is mild calcification of the aortic valve. Aortic valve regurgitation is mild. Aortic valve sclerosis/calcification is present, without any evidence of aortic stenosis. Pulmonic  Valve: The pulmonic valve was normal in structure. Pulmonic valve regurgitation is trivial. No evidence of pulmonic stenosis. Aorta: Aortic dilatation noted. There is borderline dilatation of the aortic root, measuring 38 mm. There is borderline dilatation of the ascending aorta, measuring 38 mm. Venous: The inferior vena cava is dilated in size with greater than 50% respiratory variability, suggesting right atrial pressure of 8 mmHg. IAS/Shunts: No atrial level shunt detected by color flow Doppler.  LEFT VENTRICLE PLAX 2D LVIDd:         4.70 cm  Diastology LVIDs:         4.10 cm      LV e' medial:    4.29 cm/s LV PW:         1.40 cm      LV E/e' medial:  27.0 LV IVS:        1.40 cm      LV e' lateral:   5.08 cm/s LVOT diam:     2.10 cm      LV E/e' lateral: 22.8 LV SV:         70 LV SV Index:   32 LVOT Area:     3.46 cm  LV Volumes (MOD) LV vol d, MOD A2C: 127.0 ml LV vol d, MOD A4C: 134.0 ml LV vol s, MOD A2C: 92.8 ml LV vol s, MOD A4C: 88.9 ml LV SV MOD A2C:     34.2 ml LV SV MOD A4C:     134.0 ml LV SV MOD BP:      41.2 ml RIGHT VENTRICLE RV Basal diam:  3.40 cm RV S prime:     13.10 cm/s TAPSE (M-mode): 3.1 cm LEFT ATRIUM             Index        RIGHT ATRIUM           Index LA diam:        3.40 cm 1.53 cm/m   RA Area:     18.00 cm LA Vol (A2C):   76.0 ml 34.28 ml/m  RA Volume:   47.90 ml  21.61 ml/m LA Vol (A4C):   52.0 ml 23.46 ml/m LA Biplane Vol: 70.1 ml 31.62 ml/m  AORTIC VALVE LVOT Vmax:   110.00 cm/s LVOT Vmean:  76.400 cm/s LVOT VTI:    0.203 m  AORTA Ao Root diam: 3.80 cm Ao Asc diam:  3.80 cm MITRAL VALVE MV Area (PHT): 8.43 cm     SHUNTS MV Decel Time: 90 msec      Systemic VTI:  0.20 m MV E velocity: 116.00 cm/s  Systemic Diam: 2.10 cm MV A velocity: 60.00 cm/s MV E/A ratio:  1.93 Toribio Fuel MD Electronically signed by Toribio Fuel MD Signature Date/Time: 05/06/2024/12:35:30 PM    Final     Cardiac Studies:  ECG: ST rate 108 no ischemic changes   Telemetry:  NSR   Echo:  Small pericardial effusion no tamponade decreased EF 20-25%   Medications:    albuterol   2.5 mg Nebulization BID   albuterol   2.5 mg Nebulization Once   arformoterol   15 mcg Nebulization BID   And   umeclidinium bromide   1 puff Inhalation Daily   clopidogrel   75 mg Oral Daily   empagliflozin   25 mg Oral Daily   enoxaparin  (LOVENOX ) injection  40 mg Subcutaneous Q24H   furosemide   20 mg Oral Daily   insulin  aspart  0-20 Units Subcutaneous TID WC   metFORMIN   500 mg Oral BID WC   metoprolol  tartrate  100 mg Oral BID   predniSONE   40 mg Oral Q breakfast   sacubitril -valsartan   1 tablet Oral BID   simvastatin   20 mg Oral Daily   spironolactone   12.5 mg Oral Daily   tamsulosin   0.4 mg Oral QPC supper        Assessment/Plan:   Pericardial Effusion:  small no tamponade no need to tap CHF:  new on set systolic CHF. Have adjusted his meds to include Jardiance , Toprol , entresto  and low  dose aldactone , lasix  20 mg K 3.5 BUN 34 Cr 1.3 stable  Lung cancer:  stage 3 can no longer have anthracycline based chemo. Will need f/u for progressive dx by CTA  Will arrange f/u with Dr Lavona regarding CHF Ok to d/c home  Jonathan [name] 05/08/2024, 10:03 AM

## 2024-05-09 ENCOUNTER — Other Ambulatory Visit: Payer: Self-pay

## 2024-05-09 ENCOUNTER — Observation Stay (HOSPITAL_COMMUNITY)
Admission: EM | Admit: 2024-05-09 | Discharge: 2024-05-10 | Disposition: A | Discharge status: Home / Self Care | Attending: Internal Medicine | Admitting: Internal Medicine

## 2024-05-09 ENCOUNTER — Emergency Department (HOSPITAL_COMMUNITY)

## 2024-05-09 DIAGNOSIS — R0600 Dyspnea, unspecified: Secondary | ICD-10-CM

## 2024-05-09 DIAGNOSIS — I509 Heart failure, unspecified: Secondary | ICD-10-CM | POA: Diagnosis not present

## 2024-05-09 DIAGNOSIS — I5043 Acute on chronic combined systolic (congestive) and diastolic (congestive) heart failure: Principal | ICD-10-CM | POA: Diagnosis present

## 2024-05-09 DIAGNOSIS — R069 Unspecified abnormalities of breathing: Secondary | ICD-10-CM | POA: Diagnosis not present

## 2024-05-09 DIAGNOSIS — J90 Pleural effusion, not elsewhere classified: Secondary | ICD-10-CM | POA: Diagnosis not present

## 2024-05-09 DIAGNOSIS — Z87891 Personal history of nicotine dependence: Secondary | ICD-10-CM | POA: Diagnosis not present

## 2024-05-09 DIAGNOSIS — I5023 Acute on chronic systolic (congestive) heart failure: Secondary | ICD-10-CM | POA: Diagnosis not present

## 2024-05-09 DIAGNOSIS — I130 Hypertensive heart and chronic kidney disease with heart failure and stage 1 through stage 4 chronic kidney disease, or unspecified chronic kidney disease: Secondary | ICD-10-CM | POA: Insufficient documentation

## 2024-05-09 DIAGNOSIS — I2510 Atherosclerotic heart disease of native coronary artery without angina pectoris: Secondary | ICD-10-CM

## 2024-05-09 DIAGNOSIS — N1832 Chronic kidney disease, stage 3b: Secondary | ICD-10-CM | POA: Insufficient documentation

## 2024-05-09 DIAGNOSIS — R918 Other nonspecific abnormal finding of lung field: Secondary | ICD-10-CM | POA: Diagnosis not present

## 2024-05-09 DIAGNOSIS — C3490 Malignant neoplasm of unspecified part of unspecified bronchus or lung: Secondary | ICD-10-CM | POA: Insufficient documentation

## 2024-05-09 DIAGNOSIS — J9621 Acute and chronic respiratory failure with hypoxia: Secondary | ICD-10-CM | POA: Diagnosis not present

## 2024-05-09 DIAGNOSIS — J811 Chronic pulmonary edema: Secondary | ICD-10-CM | POA: Diagnosis not present

## 2024-05-09 DIAGNOSIS — J9611 Chronic respiratory failure with hypoxia: Secondary | ICD-10-CM

## 2024-05-09 DIAGNOSIS — E119 Type 2 diabetes mellitus without complications: Secondary | ICD-10-CM | POA: Diagnosis not present

## 2024-05-09 DIAGNOSIS — R0602 Shortness of breath: Secondary | ICD-10-CM | POA: Diagnosis not present

## 2024-05-09 DIAGNOSIS — Z7984 Long term (current) use of oral hypoglycemic drugs: Secondary | ICD-10-CM | POA: Diagnosis not present

## 2024-05-09 DIAGNOSIS — J441 Chronic obstructive pulmonary disease with (acute) exacerbation: Secondary | ICD-10-CM

## 2024-05-09 DIAGNOSIS — Z79899 Other long term (current) drug therapy: Secondary | ICD-10-CM | POA: Diagnosis not present

## 2024-05-09 DIAGNOSIS — R062 Wheezing: Secondary | ICD-10-CM | POA: Diagnosis not present

## 2024-05-09 DIAGNOSIS — I110 Hypertensive heart disease with heart failure: Secondary | ICD-10-CM | POA: Diagnosis not present

## 2024-05-09 LAB — BASIC METABOLIC PANEL WITH GFR
Anion gap: 9 (ref 5–15)
BUN: 30 mg/dL — ABNORMAL HIGH (ref 8–23)
CO2: 19 mmol/L — ABNORMAL LOW (ref 22–32)
Calcium: 8.4 mg/dL — ABNORMAL LOW (ref 8.9–10.3)
Chloride: 113 mmol/L — ABNORMAL HIGH (ref 98–111)
Creatinine, Ser: 1.38 mg/dL — ABNORMAL HIGH (ref 0.61–1.24)
GFR, Estimated: 50 mL/min — ABNORMAL LOW (ref >60)
Glucose, Bld: 111 mg/dL — ABNORMAL HIGH (ref 70–99)
Potassium: 4.6 mmol/L (ref 3.5–5.1)
Sodium: 141 mmol/L (ref 135–145)

## 2024-05-09 LAB — LIPID PANEL
Cholesterol: 124 mg/dL (ref 0–200)
HDL: 49 mg/dL (ref >40)
LDL Cholesterol: 61 mg/dL (ref 0–99)
Total CHOL/HDL Ratio: 2.5 ratio
Triglycerides: 72 mg/dL (ref <150)
VLDL: 14 mg/dL (ref 0–40)

## 2024-05-09 LAB — CBC WITH DIFFERENTIAL/PLATELET
Abs Immature Granulocytes: 0.04 K/uL (ref 0.00–0.07)
Basophils Absolute: 0 K/uL (ref 0.0–0.1)
Basophils Relative: 0 %
Eosinophils Absolute: 0 K/uL (ref 0.0–0.5)
Eosinophils Relative: 0 %
HCT: 43.5 % (ref 39.0–52.0)
Hemoglobin: 14.5 g/dL (ref 13.0–17.0)
Immature Granulocytes: 0 %
Lymphocytes Relative: 13 %
Lymphs Abs: 1.3 K/uL (ref 0.7–4.0)
MCH: 32.8 pg (ref 26.0–34.0)
MCHC: 33.3 g/dL (ref 30.0–36.0)
MCV: 98.4 fL (ref 80.0–100.0)
Monocytes Absolute: 1.2 K/uL — ABNORMAL HIGH (ref 0.1–1.0)
Monocytes Relative: 12 %
Neutro Abs: 8.1 K/uL — ABNORMAL HIGH (ref 1.7–7.7)
Neutrophils Relative %: 75 %
Platelets: 231 K/uL (ref 150–400)
RBC: 4.42 MIL/uL (ref 4.22–5.81)
RDW: 14.7 % (ref 11.5–15.5)
WBC: 10.7 K/uL — ABNORMAL HIGH (ref 4.0–10.5)
nRBC: 0 % (ref 0.0–0.2)

## 2024-05-09 LAB — TSH: TSH: 2.288 u[IU]/mL (ref 0.350–4.500)

## 2024-05-09 LAB — BRAIN NATRIURETIC PEPTIDE: B Natriuretic Peptide: 1183.5 pg/mL — ABNORMAL HIGH (ref 0.0–100.0)

## 2024-05-09 LAB — TROPONIN I (HIGH SENSITIVITY)
Troponin I (High Sensitivity): 26 ng/L — ABNORMAL HIGH (ref <18)
Troponin I (High Sensitivity): 38 ng/L — ABNORMAL HIGH (ref <18)

## 2024-05-09 LAB — RESP PANEL BY RT-PCR (RSV, FLU A&B, COVID)  RVPGX2
Influenza A by PCR: NEGATIVE
Influenza B by PCR: NEGATIVE
Resp Syncytial Virus by PCR: NEGATIVE
SARS Coronavirus 2 by RT PCR: NEGATIVE

## 2024-05-09 LAB — HEMOGLOBIN A1C
Hgb A1c MFr Bld: 6.8 % — ABNORMAL HIGH (ref 4.8–5.6)
Mean Plasma Glucose: 148.46 mg/dL

## 2024-05-09 LAB — PROCALCITONIN: Procalcitonin: <0.1 ng/mL

## 2024-05-09 MED ORDER — UMECLIDINIUM BROMIDE 62.5 MCG/ACT IN AEPB
1.0000 | INHALATION_SPRAY | Freq: Every day | RESPIRATORY_TRACT | Status: DC
  Administered 2024-05-10: 1 via RESPIRATORY_TRACT
  Filled 2024-05-09: qty 7

## 2024-05-09 MED ORDER — TAMSULOSIN HCL 0.4 MG PO CAPS
0.4000 mg | ORAL_CAPSULE | Freq: Every day | ORAL | Status: DC
  Administered 2024-05-09: 0.4 mg via ORAL
  Filled 2024-05-09: qty 1

## 2024-05-09 MED ORDER — CLOPIDOGREL BISULFATE 75 MG PO TABS
75.0000 mg | ORAL_TABLET | Freq: Every day | ORAL | Status: DC
  Administered 2024-05-10: 75 mg via ORAL
  Filled 2024-05-09: qty 1

## 2024-05-09 MED ORDER — ENOXAPARIN SODIUM 40 MG/0.4ML IJ SOSY
40.0000 mg | PREFILLED_SYRINGE | INTRAMUSCULAR | Status: DC
  Administered 2024-05-09: 40 mg via SUBCUTANEOUS
  Filled 2024-05-09: qty 0.4

## 2024-05-09 MED ORDER — GUAIFENESIN-DM 100-10 MG/5ML PO SYRP
5.0000 mL | ORAL_SOLUTION | ORAL | Status: DC | PRN
  Administered 2024-05-09: 5 mL via ORAL
  Filled 2024-05-09: qty 5

## 2024-05-09 MED ORDER — IPRATROPIUM-ALBUTEROL 0.5-2.5 (3) MG/3ML IN SOLN
3.0000 mL | Freq: Four times a day (QID) | RESPIRATORY_TRACT | Status: DC | PRN
  Administered 2024-05-09 (×2): 3 mL via RESPIRATORY_TRACT
  Filled 2024-05-09 (×2): qty 3

## 2024-05-09 MED ORDER — EMPAGLIFLOZIN 25 MG PO TABS
25.0000 mg | ORAL_TABLET | Freq: Every day | ORAL | Status: DC
Start: 2024-05-10 — End: 2024-05-10
  Administered 2024-05-10: 25 mg via ORAL
  Filled 2024-05-09: qty 1

## 2024-05-09 MED ORDER — SACUBITRIL-VALSARTAN 24-26 MG PO TABS
1.0000 | ORAL_TABLET | Freq: Two times a day (BID) | ORAL | Status: DC
  Administered 2024-05-09 – 2024-05-10 (×2): 1 via ORAL
  Filled 2024-05-09 (×3): qty 1

## 2024-05-09 MED ORDER — ATORVASTATIN CALCIUM 40 MG PO TABS
40.0000 mg | ORAL_TABLET | Freq: Every day | ORAL | Status: DC
  Administered 2024-05-09 – 2024-05-10 (×2): 40 mg via ORAL
  Filled 2024-05-09 (×2): qty 1

## 2024-05-09 MED ORDER — SPIRONOLACTONE 12.5 MG HALF TABLET
12.5000 mg | ORAL_TABLET | Freq: Every day | ORAL | Status: DC
  Administered 2024-05-09 – 2024-05-10 (×2): 12.5 mg via ORAL
  Filled 2024-05-09 (×2): qty 1

## 2024-05-09 MED ORDER — PREDNISONE 10 MG PO TABS: 50.0000 mg | ORAL_TABLET | Freq: Every day | ORAL | 0 refills | Status: DC

## 2024-05-09 MED ORDER — FUROSEMIDE 10 MG/ML IJ SOLN
40.0000 mg | Freq: Once | INTRAMUSCULAR | Status: AC
  Administered 2024-05-09: 40 mg via INTRAVENOUS
  Filled 2024-05-09: qty 4

## 2024-05-09 MED ORDER — METOPROLOL TARTRATE 50 MG PO TABS: 100.0000 mg | ORAL_TABLET | Freq: Two times a day (BID) | ORAL | Status: DC

## 2024-05-09 MED ORDER — GABAPENTIN 300 MG PO CAPS
300.0000 mg | ORAL_CAPSULE | Freq: Two times a day (BID) | ORAL | Status: DC
  Administered 2024-05-09 – 2024-05-10 (×3): 300 mg via ORAL
  Filled 2024-05-09 (×3): qty 1

## 2024-05-09 MED ORDER — HYDROXYZINE HCL 25 MG PO TABS: 25.0000 mg | ORAL_TABLET | Freq: Every evening | ORAL | Status: DC | PRN

## 2024-05-09 MED ORDER — METOPROLOL SUCCINATE ER 100 MG PO TB24
200.0000 mg | ORAL_TABLET | Freq: Every day | ORAL | Status: DC
  Administered 2024-05-09 – 2024-05-10 (×2): 200 mg via ORAL
  Filled 2024-05-09 (×2): qty 2

## 2024-05-09 MED ORDER — SIMVASTATIN 20 MG PO TABS: 20.0000 mg | ORAL_TABLET | Freq: Every day | ORAL | Status: DC

## 2024-05-09 MED ORDER — ARFORMOTEROL TARTRATE 15 MCG/2ML IN NEBU
15.0000 ug | INHALATION_SOLUTION | Freq: Two times a day (BID) | RESPIRATORY_TRACT | Status: DC
  Administered 2024-05-09 – 2024-05-10 (×2): 15 ug via RESPIRATORY_TRACT
  Filled 2024-05-09 (×2): qty 2

## 2024-05-09 MED ORDER — PREDNISONE 10 MG PO TABS: 40.0000 mg | ORAL_TABLET | Freq: Every day | ORAL | 0 refills | Status: DC

## 2024-05-09 MED ORDER — FUROSEMIDE 10 MG/ML IJ SOLN
40.0000 mg | Freq: Two times a day (BID) | INTRAMUSCULAR | Status: DC
  Administered 2024-05-09: 40 mg via INTRAVENOUS
  Filled 2024-05-09 (×2): qty 4

## 2024-05-09 NOTE — ED Notes (Signed)
 Pt ambulated w/o assistance on baseline 2L O2. Pt did not endorse shortness of breath at any time. Increased work of breathing noted when we returned to room but pt stated he felt great. Sats dropped to 78%

## 2024-05-09 NOTE — Consult Note (Signed)
 Cardiology Consultation   Patient ID: Jonathan [name]. MRN: [id number]; DOB: 11-Jul-1936  Admit date: 05/09/2024 Date of Consult: 05/09/2024  PCP:  [name], MD   [facility] Providers Cardiologist:  Dorn Lesches, MD  Cardiology APP:  Madie Jon Garre, [place]       History of Present Illness: Jonathan [name]. is a 88 y.o. male with medical history significant of CAD s/p PCI to RCA/LAD in 12/2021, new HFrEF (EF 20-25% as of 05/06/2024; previously 55-60% in 09/2023), stage 3 SCC of LUL (dx 09/2023; s/p 7 cycles of carboplatin  for AUC of 2 and paclitaxel  45 Mg/M2 and last dose was given 12/23/2023; currently on consolidation immunotherapy with Imfinzi  1500 mg IV), COPD c/b chronic respiratory failure (2L Bruce), HTN, HLD, PVD who presented to the ED after awaking overnight with sudden onset shortness of breath. He was just discharged from [facility] on 8/22 following an admission for acute decompensated heart failure. He did not take his medications yesterday evening after being released from the hospital. And he also endorses non-compliance with low salt diet since discharge. Aside from his 1 episode of PND, he reports feeling relatively well.    Past Medical History:  Diagnosis Date   CAD (coronary artery disease)    Two RCA stents remotely / 3rd RCA stent 2006   Colon polyps    s/p several Cscopes.   COPD (chronic obstructive pulmonary disease) (HCC)    on O2, nocturnal   Diabetes mellitus    dx aprox 2009   ED (erectile dysfunction)    has a vacumm device   Ejection fraction    Hyperlipidemia    dx in 90s   Hypertension    dx in the 46   Hypogonadism male    PVD (peripheral vascular disease) (HCC)    s/p stents at LE 2009, Dr Ladona   Shortness of breath    O2 Sat dropped to 82% walking on the treadmill, September, 2012    Past Surgical History:  Procedure Laterality Date   APPENDECTOMY     BRONCHIAL BIOPSY  10/01/2023   Procedure: BRONCHIAL BIOPSIES;  Surgeon:  Brenna Adine CROME, DO;  Location: [facility] ENDOS[facility];  Service: Pulmonary;;   BRONCHIAL BRUSHINGS  10/01/2023   Procedure: BRONCHIAL BRUSHINGS;  Surgeon: Brenna Adine CROME, DO;  Location: [facility];  Service: Pulmonary;;   BRONCHIAL WASHINGS  10/01/2023   Procedure: BRONCHIAL WASHINGS;  Surgeon: Brenna Adine CROME, DO;  Location: [facility];  Service: Pulmonary;;   CORONARY STENT INTERVENTION N/A 01/12/2022   Procedure: CORONARY STENT INTERVENTION;  Surgeon: Swaziland, Peter M, MD;  Location: [facility] INVASIVE CV LAB;  Service: Cardiovascular;  Laterality: N/A;   HEMOSTASIS CONTROL  10/01/2023   Procedure: HEMOSTASIS CONTROL;  Surgeon: Brenna Adine CROME, DO;  Location: [facility];  Service: Pulmonary;;   LEFT HEART CATH AND CORONARY ANGIOGRAPHY N/A 03/13/2023   Procedure: LEFT HEART CATH AND CORONARY ANGIOGRAPHY;  Surgeon: Swaziland, Peter M, MD;  Location: [facility] INVASIVE CV LAB;  Service: Cardiovascular;  Laterality: N/A;   RIGHT/LEFT HEART CATH AND CORONARY ANGIOGRAPHY N/A 01/11/2022   Procedure: RIGHT/LEFT HEART CATH AND CORONARY ANGIOGRAPHY;  Surgeon: Claudene Victory ORN, MD;  Location: [facility] INVASIVE CV LAB;  Service: Cardiovascular;  Laterality: N/A;   TONSILLECTOMY     VIDEO BRONCHOSCOPY  10/01/2023   Procedure: VIDEO BRONCHOSCOPY WITHOUT FLUORO;  Surgeon: Brenna Adine CROME, DO;  Location: [facility];  Service: Pulmonary;;     Scheduled Meds:  arformoterol   15 mcg  Nebulization BID   And   umeclidinium bromide   1 puff Inhalation Daily   atorvastatin   40 mg Oral Daily   [START ON 05/10/2024] clopidogrel   75 mg Oral Q breakfast   [START ON 05/10/2024] empagliflozin   25 mg Oral QAC breakfast   enoxaparin  (LOVENOX ) injection  40 mg Subcutaneous Q24H   furosemide   40 mg Intravenous BID   gabapentin   300 mg Oral BID   metoprolol  succinate  200 mg Oral Daily   sacubitril -valsartan   1 tablet Oral BID   spironolactone   12.5 mg Oral Daily   tamsulosin   0.4 mg Oral QPC supper    PRN Meds: hydrOXYzine ,  ipratropium-albuterol   Allergies:   No Known Allergies  Social History:   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   Tobacco Use   Smoking status: Former    Current packs/day: 0.00    Average packs/day: 2.0 packs/day for 30.0 years (60.0 ttl pk-yrs)    Types: Cigarettes    Start date: 06/18/1947    Quit date: 06/17/1977    Years since quitting: 46.9   Smokeless tobacco: Never   Tobacco comments:    2 ppd, quit 1978  Vaping Use   Vaping status: Never Used  Substance and Sexual Activity   Alcohol use: Not Currently   Drug use: No   Sexual activity: Yes  Other Topics Concern   Not on file  Social History Narrative   4 children (lost 1 son)   Wife has 2 children                Social Drivers of Corporate investment banker Strain: Low Risk  (12/17/2023)   Overall Financial Resource Strain (CARDIA)    Difficulty of Paying Living Expenses: Not hard at all  Food Insecurity: No Food Insecurity (05/06/2024)   Hunger Vital Sign    Worried About Running Out of Food in the Last Year: Never true    Ran Out of Food in the Last Year: Never true  Transportation Needs: No Transportation Needs (05/06/2024)   PRAPARE - Administrator, Civil Service (Medical): No    Lack of Transportation (Non-Medical): No  Physical Activity: Inactive (12/17/2023)   Exercise Vital Sign    Days of Exercise per Week: 0 days    Minutes of Exercise per Session: 0 min  Stress: No Stress Concern Present (12/17/2023)   [employer] of Occupational Health - Occupational Stress Questionnaire    Feeling of Stress : Not at all  Social Connections: Moderately Isolated (05/06/2024)   Social Connection and Isolation Panel    Frequency of Communication with Friends and  Family: More than three times a week    Frequency of Social Gatherings with Friends and Family: Once a week    Attends Religious Services: Never    Database administrator or Organizations: No    Attends Banker Meetings: Never    Marital Status: Married  Catering manager Violence: Not At Risk (05/06/2024)   Humiliation, Afraid, Rape, and Kick questionnaire    Fear of Current or Ex-Partner: No    Emotionally Abused: No    Physically Abused: No    Sexually Abused: No  Family History:   Family History  Problem Relation Age of Onset   Heart disease Father    Hypertension Father    Stroke Father    Diabetes Paternal Aunt    Diabetes Maternal Grandmother    Diabetes Other        GM, nephews, many family members   Hyperlipidemia Other        ?   Prostate cancer Brother    Colon cancer Neg Hx      ROS:  Please see the history of present illness.   All other ROS reviewed and negative.     Physical Exam/Data: Vitals:   05/09/24 0955 05/09/24 1008 05/09/24 1148 05/09/24 1156  BP:   (!) 142/107   Pulse: 84  87   Resp: 17  16   Temp:  (!) 97.3 F (36.3 C) 97.6 F (36.4 C) 97.8 F (36.6 C)  TempSrc:  Oral Oral Oral  SpO2: 97%  (!) 89% (!) 88%   No intake or output data in the 24 hours ending 05/09/24 1523    05/07/2024    8:57 AM 05/06/2024    8:17 PM 05/06/2024   12:42 PM  Last 3 Weights  Weight (lbs) 201 lb 15.1 oz 201 lb 4.5 oz 198 lb 11.2 oz  Weight (kg) 91.6 kg 91.3 kg 90.13 kg     There is no height or weight on file to calculate BMI.   .General: Well developed, in no acute distress.  Neck: Minimally elevated JVD.  Cardiac: Normal rate, regular rhythm.  Resp: Normal work of breathing.  Ext: No edema.  Neuro: No gross focal deficits.  Psych: Normal affect.   EKG:  The EKG was personally reviewed and demonstrates:  SR with 1st deg AV block  Relevant CV Studies: Echo 05/06/24:   1. Left ventricular ejection fraction, by estimation, is 20 to 25%.  The  left ventricle has severely decreased function. The left ventricle  demonstrates global hypokinesis. There is moderate concentric left  ventricular hypertrophy. Left ventricular  diastolic parameters are indeterminate.   2. Right ventricular systolic function is normal. The right ventricular  size is normal.   3. Left atrial size was mildly dilated.   4. There is a small circumfrential pericardial effusion with a moderate  focal collection abutting the RA. There is mild invagination of the RA but  no overt tamponade. a small pericardial effusion is present. The  pericardial effusion is circumferential  and localized near the right atrium. There is no evidence of cardiac  tamponade.   5. The mitral valve is normal in structure. Trivial mitral valve  regurgitation. No evidence of mitral stenosis.   6. The aortic valve is tricuspid. There is mild calcification of the  aortic valve. Aortic valve regurgitation is mild. Aortic valve  sclerosis/calcification is present, without any evidence of aortic  stenosis.   7. Aortic dilatation noted. There is borderline dilatation of the aortic  root, measuring 38 mm. There is borderline dilatation of the ascending  aorta, measuring 38 mm.   8. The inferior vena cava is dilated in size with >50% respiratory  variability, suggesting right atrial pressure of 8 mmHg.   LHC 02/2023:    Ost Cx lesion is 60% stenosed.   Mid Cx to Dist Cx lesion is 40% stenosed.   Dist LAD lesion is 40% stenosed.   Non-stenotic Prox LAD lesion was previously treated.   Non-stenotic Prox RCA lesion was previously treated.   LV end diastolic pressure  is mildly elevated.   Nonobstructive CAD. Continued patency of stents in the LAD and RCA Mildly elevated LVEDP 22 mm Hg.   Laboratory Data: High Sensitivity Troponin:   Recent Labs  Lab 05/06/24 0647 05/06/24 0851 05/09/24 0610 05/09/24 0811  TROPONINIHS 22* 22* 26* 38*     Chemistry Recent Labs  Lab  05/06/24 0647 05/07/24 0617 05/08/24 0605 05/09/24 0610  NA 137 139 138 141  K 3.6 3.7 3.5 4.6  CL 101 106 108 113*  CO2 23 22 22  19*  GLUCOSE 144* 189* 121* 111*  BUN 23 26* 34* 30*  CREATININE 1.37* 1.42* 1.31* 1.38*  CALCIUM  8.9 8.7* 8.3* 8.4*  MG 2.4  --   --   --   GFRNONAA 50* 48* 53* 50*  ANIONGAP 13 11 8 9     Recent Labs  Lab 05/06/24 0647 05/07/24 0617  PROT 7.5 6.7  ALBUMIN 3.5 3.1*  AST 22 13*  ALT 15 14  ALKPHOS 71 59  BILITOT 0.9 0.7   Lipids No results for input(s): CHOL, TRIG, HDL, LABVLDL, LDLCALC, CHOLHDL in the last 168 hours.  Hematology Recent Labs  Lab 05/06/24 0647 05/07/24 0617 05/09/24 0811  WBC 7.9 6.6 10.7*  RBC 4.96 4.58 4.42  HGB 16.0 14.8 14.5  HCT 48.8 45.0 43.5  MCV 98.4 98.3 98.4  MCH 32.3 32.3 32.8  MCHC 32.8 32.9 33.3  RDW 14.5 14.6 14.7  PLT 208 222 231   Thyroid  No results for input(s): TSH, FREET4 in the last 168 hours.  BNP Recent Labs  Lab 05/06/24 1530 05/09/24 0730  BNP 502.4* 1,183.5*    DDimer No results for input(s): DDIMER in the last 168 hours.  Radiology/Studies:  DG Chest Portable 1 View Result Date: 05/09/2024 CLINICAL DATA:  Shortness of breath. EXAM: PORTABLE CHEST 1 VIEW COMPARISON:  Portable chest and chest CT with contrast both 05/06/2024. FINDINGS: 6:10 a.m. Stable cardiomegaly. left upper lobe perihilar known mass is not well seen on this radiograph. The heart silhouette likely exaggerated by pericardial effusion. There is perihilar vascular congestion, mild increased interstitial edema with a basal gradient. Small layering pleural effusions appear similar. No active airspace infiltrate is seen. Stable mediastinum with aortic tortuosity and calcification. No new osseous finding.  Multiple overlying telemetry leads. IMPRESSION: 1. Cardiomegaly with perihilar vascular congestion and mild increased interstitial edema. 2. Small layering pleural effusions appear similar. 3. Left upper lobe  perihilar known mass is not well seen on this radiograph. No focal infiltrate. Electronically Signed   By: Francis Quam M.D.   On: 05/09/2024 06:21   DG Chest Port 1 View Result Date: 05/06/2024 CLINICAL DATA:  Shortness of breath, decreased O2 sats. EXAM: PORTABLE CHEST 1 VIEW COMPARISON:  05/06/2024 and CT chest 05/06/2024. FINDINGS: Trachea is midline. Heart is enlarged. Masslike consolidation in the perihilar left upper lobe, better evaluated on CT chest 05/06/2024. Similar left basilar coarsened airspace opacification. Minimal coarsening in the right lung base. Small bilateral pleural effusions. IMPRESSION: 1. Masslike collapse/consolidation in the left upper lobe with presumed changes of radiation therapy in the adjacent left lung, as before. Difficult to exclude superimposed pneumonia. 2. Small bilateral pleural effusions. Electronically Signed   By: Newell Eke M.D.   On: 05/06/2024 17:22   ECHOCARDIOGRAM COMPLETE Result Date: 05/06/2024    ECHOCARDIOGRAM REPORT   Patient Name:   Jonathan [name]. Date of Exam: 05/06/2024 Medical Rec #:  [id number]           Height:  74.0 in Accession #:    [id number]          Weight:       209.5 lb Date of Birth:  1935/12/26            BSA:          2.217 m Patient Age:    88 years            BP:           143/85 mmHg Patient Gender: M                   HR:           91 bpm. Exam Location:  Inpatient Procedure: 2D Echo, Cardiac Doppler and Color Doppler (Both Spectral and Color            Flow Doppler were utilized during procedure). Indications:    Pericardial Effusion  History:        Patient has prior history of Echocardiogram examinations.                 Pericardial Disease; Risk Factors:Hypertension.  Sonographer:    Vella Key Referring Phys: [id number] PETER C NISHAN IMPRESSIONS  1. Left ventricular ejection fraction, by estimation, is 20 to 25%. The left ventricle has severely decreased function. The left ventricle demonstrates global hypokinesis. There is  moderate concentric left ventricular hypertrophy. Left ventricular diastolic parameters are indeterminate.  2. Right ventricular systolic function is normal. The right ventricular size is normal.  3. Left atrial size was mildly dilated.  4. There is a small circumfrential pericardial effusion with a moderate focal collection abutting the RA. There is mild invagination of the RA but no overt tamponade. a small pericardial effusion is present. The pericardial effusion is circumferential and localized near the right atrium. There is no evidence of cardiac tamponade.  5. The mitral valve is normal in structure. Trivial mitral valve regurgitation. No evidence of mitral stenosis.  6. The aortic valve is tricuspid. There is mild calcification of the aortic valve. Aortic valve regurgitation is mild. Aortic valve sclerosis/calcification is present, without any evidence of aortic stenosis.  7. Aortic dilatation noted. There is borderline dilatation of the aortic root, measuring 38 mm. There is borderline dilatation of the ascending aorta, measuring 38 mm.  8. The inferior vena cava is dilated in size with >50% respiratory variability, suggesting right atrial pressure of 8 mmHg. FINDINGS  Left Ventricle: Left ventricular ejection fraction, by estimation, is 20 to 25%. The left ventricle has severely decreased function. The left ventricle demonstrates global hypokinesis. The left ventricular internal cavity size was normal in size. There is moderate concentric left ventricular hypertrophy. Left ventricular diastolic parameters are indeterminate. Right Ventricle: The right ventricular size is normal. No increase in right ventricular wall thickness. Right ventricular systolic function is normal. Left Atrium: Left atrial size was mildly dilated. Right Atrium: Right atrial size was normal in size. Pericardium: There is a small circumfrential pericardial effusion with a moderate focal collection abutting the RA. There is mild  invagination of the RA but no overt tamponade. A small pericardial effusion is present. The pericardial effusion is circumferential and localized near the right atrium. There is no evidence of cardiac tamponade. Mitral Valve: The mitral valve is normal in structure. Trivial mitral valve regurgitation. No evidence of mitral valve stenosis. Tricuspid Valve: The tricuspid valve is normal in structure. Tricuspid valve regurgitation is trivial. No evidence of tricuspid stenosis. Aortic Valve: The aortic  valve is tricuspid. There is mild calcification of the aortic valve. Aortic valve regurgitation is mild. Aortic valve sclerosis/calcification is present, without any evidence of aortic stenosis. Pulmonic Valve: The pulmonic valve was normal in structure. Pulmonic valve regurgitation is trivial. No evidence of pulmonic stenosis. Aorta: Aortic dilatation noted. There is borderline dilatation of the aortic root, measuring 38 mm. There is borderline dilatation of the ascending aorta, measuring 38 mm. Venous: The inferior vena cava is dilated in size with greater than 50% respiratory variability, suggesting right atrial pressure of 8 mmHg. IAS/Shunts: No atrial level shunt detected by color flow Doppler.  LEFT VENTRICLE PLAX 2D LVIDd:         4.70 cm      Diastology LVIDs:         4.10 cm      LV e' medial:    4.29 cm/s LV PW:         1.40 cm      LV E/e' medial:  27.0 LV IVS:        1.40 cm      LV e' lateral:   5.08 cm/s LVOT diam:     2.10 cm      LV E/e' lateral: 22.8 LV SV:         70 LV SV Index:   32 LVOT Area:     3.46 cm  LV Volumes (MOD) LV vol d, MOD A2C: 127.0 ml LV vol d, MOD A4C: 134.0 ml LV vol s, MOD A2C: 92.8 ml LV vol s, MOD A4C: 88.9 ml LV SV MOD A2C:     34.2 ml LV SV MOD A4C:     134.0 ml LV SV MOD BP:      41.2 ml RIGHT VENTRICLE RV Basal diam:  3.40 cm RV S prime:     13.10 cm/s TAPSE (M-mode): 3.1 cm LEFT ATRIUM             Index        RIGHT ATRIUM           Index LA diam:        3.40 cm 1.53 cm/m    RA Area:     18.00 cm LA Vol (A2C):   76.0 ml 34.28 ml/m  RA Volume:   47.90 ml  21.61 ml/m LA Vol (A4C):   52.0 ml 23.46 ml/m LA Biplane Vol: 70.1 ml 31.62 ml/m  AORTIC VALVE LVOT Vmax:   110.00 cm/s LVOT Vmean:  76.400 cm/s LVOT VTI:    0.203 m  AORTA Ao Root diam: 3.80 cm Ao Asc diam:  3.80 cm MITRAL VALVE MV Area (PHT): 8.43 cm     SHUNTS MV Decel Time: 90 msec      Systemic VTI:  0.20 m MV E velocity: 116.00 cm/s  Systemic Diam: 2.10 cm MV A velocity: 60.00 cm/s MV E/A ratio:  1.93 Toribio Fuel MD Electronically signed by Toribio Fuel MD Signature Date/Time: 05/06/2024/12:35:30 PM    Final    CT Chest W Contrast Addendum Date: 05/06/2024 ADDENDUM REPORT: 05/06/2024 10:11 ADDENDUM: The original report was by Dr. Ryan Salvage. The following addendum is by Dr. Ryan Salvage: These results were called by telephone at the time of interpretation on 05/06/2024 at 10:02 am to provider [facility] , who verbally acknowledged these results. Electronically Signed   By: Ryan Salvage M.D.   On: 05/06/2024 10:11   Result Date: 05/06/2024 CLINICAL DATA:  Left upper lobe non-small cell lung cancer,  hypoxia and shortness of breath. * Tracking Code: BO * EXAM: CT CHEST WITH CONTRAST TECHNIQUE: Multidetector CT imaging of the chest was performed during intravenous contrast administration. RADIATION DOSE REDUCTION: This exam was performed according to the departmental dose-optimization program which includes automated exposure control, adjustment of the mA and/or kV according to patient size and/or use of iterative reconstruction technique. CONTRAST:  75mL OMNIPAQUE  IOHEXOL  300 MG/ML  SOLN COMPARISON:  01/16/2024 FINDINGS: Cardiovascular: Coronary, aortic arch, and branch vessel atherosclerotic vascular disease. Large and substantially increased pericardial effusion. Mediastinum/Nodes: Borderline prominence of right infrahilar nodal tissue. Lungs/Pleura: Moderate left and small right pleural  effusions with passive atelectasis. Centrilobular emphysema. Hazy airspace opacity in the aerated portion of the left lower lobe for example on image 79 series 6, suspicious for pneumonia or pneumonitis. Residuum of the left upper lobe mass measures proximally 4.0 by 1.5 cm, previously 4.5 by 1.9 cm on 01/16/2024. Airway plugging of the apicoposterior segment left upper lobe. Right upper lobe subpleural nodule 0.6 cm in diameter on image 82 series 6, stable. Upper Abdomen: Left adrenal nodule 2.1 by 1.7 cm, stable by my measurements, indeterminate for malignancy versus benign etiology. Musculoskeletal: Unremarkable IMPRESSION: 1. Large and substantially increased pericardial effusion compared to 01/16/2024. Correlate with physical exam indicators of tamponade in deciding whether echocardiographic workup is indicated. 2. Moderate left and small right pleural effusions with passive atelectasis. 3. Hazy airspace opacity in the aerated portion of the left lower lobe suspicious for pneumonia or pneumonitis. 4. Residuum of the left upper lobe mass measures 4.0 by 1.5 cm, previously 4.5 by 1.9 cm on 01/16/2024. 5. Airway plugging of the apicoposterior segment left upper lobe. 6. Stable 0.6 cm right upper lobe subpleural nodule. 7. Stable left adrenal nodule, indeterminate for malignancy versus benign etiology (although chronicity dating back to 05/03/2010 tends to favor benign etiology). 8. Aortic Atherosclerosis (ICD10-I70.0) and Emphysema (ICD10-J43.9). Radiology assistant personnel have been notified to put me in telephone contact with the referring physician or the referring physician's clinical representative in order to discuss these findings. Once this communication is established I will issue an addendum to this report for documentation purposes. Electronically Signed: By: Ryan Salvage M.D. On: 05/06/2024 09:52   DG Chest Port 1 View Result Date: 05/06/2024 CLINICAL DATA:  Shortness of breath. History of  non-small-cell lung cancer. EXAM: PORTABLE CHEST 1 VIEW COMPARISON:  02/26/2024 FINDINGS: The cardio pericardial silhouette is enlarged. Interstitial markings are diffusely coarsened with chronic features. Hazy left parahilar opacity is similar to prior. The lungs are clear without focal pneumonia, edema, pneumothorax or pleural effusion. No acute bony abnormality. Telemetry leads overlie the chest. IMPRESSION: Stable. Chronic interstitial coarsening without new or acute cardiopulmonary findings. Similar appearance hazy left parahilar opacity, region of patient's known lesion. Electronically Signed   By: Camellia Candle M.D.   On: 05/06/2024 07:26     Assessment and Plan: Mr. Mathey is an 88 year old male with past medical history notable for CAD status post PCI and chronic systolic heart failure, LVEF 40% in 2024, now 20 to 25% who presented to the ED after an episode of paroxysmal nocturnal dyspnea.  He was found to have elevated NT proBNP.  Likely mild decompensation of heart failure, which was secondary to not taking medications and dietary indiscretion following recent hospital discharge.  #.  Acute on chronic systolic heart failure: I suspect etiology of his heart failure is mixed, from ischemic heart disease and also chemotherapy.  LVEF was 40% in 2024 and has declined  to 20 to 25% currently, and he received chemotherapy between these 2 studies.  Decline in LVEF has been associated with 2 hospitalizations now for acute decompensated heart failure. - Continue GDMT regimen of metoprolol  200 mg daily, Entresto  24-26 mg twice daily, spironolactone  12.5 mg daily. - Continue IV diuresis with 40 mg Lasix  twice daily for now.  Will assess urinary output and creatinine daily and titrate accordingly.  I suspect he is near euvolemic. - Once euvolemic, consider ischemic evaluation as possible etiology for decline in LVEF.  #.  CAD status post PCI: Denies chest pain. - Continue Plavix  75 mg daily and  atorvastatin  40 mg daily.   For questions or updates, please contact [facility] Please consult [email or web address] for contact info under    Signed, Fonda Kitty, MD  05/09/2024 3:23 PM

## 2024-05-09 NOTE — ED Provider Notes (Signed)
 [place] EMERGENCY DEPARTMENT AT [facility] Provider Note   CSN: [id number] Arrival date & time: 05/09/24  9445     Patient presents with: Shortness of Breath   Jonathan [name]. is a 88 y.o. male.   The history is provided by the patient and the EMS personnel.  Shortness of Breath Jonathan [name]. is a 88 y.o. male who presents to the Emergency Department complaining of difficulty breathing. He presents the emergency department by EMS for evaluation of difficulty breathing that started around 230 in the morning. He was discharged from the hospital on Friday following admission for congestive heart failure with pleural effusion and pericardial effusion. He did miss his nighttime medications. He is on 2 L of oxygen  at baseline. When he woke up he started to feel short of breath and he started to increases oxygen  but didn't feel like it was helping so he called EMS. By EMS he received two duo nebs and feels like his breathing is back at baseline by time of ED arrival. On EMS arrival his sats were 76% on 2 L nasal cannula. No associated fever, chest pain, leg swelling or pain. He does have a chronic dry cough and this is unchanged from baseline. He has a history of COPD, squamous cell carcinoma of the lung, stage III CKD, CHF.    Prior to Admission medications   Medication Sig Start Date End Date Taking? Authorizing Provider  albuterol  (VENTOLIN  HFA) 108 (90 Base) MCG/ACT inhaler Inhale 2 puffs into the lungs every 6 (six) hours as needed for wheezing or shortness of breath. 01/28/24   Neysa Reggy BIRCH, MD  benzonatate  (TESSALON ) 200 MG capsule Take 1 capsule (200 mg total) by mouth 3 (three) times daily as needed for cough. 04/14/24   Neysa Reggy D, MD  Blood Glucose Monitoring Suppl (FREESTYLE FREEDOM LITE) w/Device KIT Use Freestyle Freedom Lite meter to check blood sugar twice daily. DX:E11.65 11/17/21   [place]on [place], MD  clopidogrel  (PLAVIX ) 75 MG tablet Take 1 tablet (75  mg total) by mouth daily with breakfast. 10/01/23   [name], MD  empagliflozin  (JARDIANCE ) 25 MG TABS tablet Take 1 tablet (25 mg total) by mouth daily before breakfast. For diabetes E11.65 and E 11.29 08/01/23   Thapa, [place], MD  furosemide  (LASIX ) 20 MG tablet Take 1 tablet (20 mg total) by mouth daily. 05/09/24   Cindy Garnette POUR, MD  gabapentin  (NEURONTIN ) 300 MG capsule Take 300 mg by mouth 2 (two) times daily. 01/22/24   [provider]  glucose blood (FREESTYLE LITE) test strip USE AS INSTRUCTED 11/25/22   Von [place], MD  hydrOXYzine  (ATARAX ) 25 MG tablet Take 1 tablet by mouth at bedtime as needed for anxiety. 10/29/23   [provider]  metFORMIN  (GLUCOPHAGE ) 500 MG tablet Take 1 tablet (500 mg total) by mouth 2 (two) times daily with a meal. Patient taking differently: Take 500-1,000 mg by mouth 2 (two) times daily with a meal. Take one tablet in the morning and take two tablets in the evening 08/01/23   Thapa, [place], MD  metoprolol  tartrate (LOPRESSOR ) 100 MG tablet Take 1 tablet (100 mg total) by mouth 2 (two) times daily. 05/08/24 07/07/24  Cindy Garnette POUR, MD  Multiple Vitamin (MULTIVITAMIN WITH MINERALS) TABS tablet Take 1 tablet by mouth daily.    [provider]  OXYGEN  Inhale 2 L/min into the lungs continuous.    [provider]  sacubitril -valsartan  (ENTRESTO ) 24-26 MG Take 1  tablet by mouth 2 (two) times daily. 02/22/23   Fairy Frames, MD  simvastatin  (ZOCOR ) 20 MG tablet Take 20 mg by mouth daily.    [provider]  spironolactone  (ALDACTONE ) 25 MG tablet Take 0.5 tablets (12.5 mg total) by mouth daily. 05/09/24 06/08/24  Cindy Garnette POUR, MD  tamsulosin  (FLOMAX ) 0.4 MG CAPS capsule Take 1 capsule (0.4 mg total) by mouth daily after supper. 10/11/23   Paz, Jose E, MD  Tiotropium Bromide -Olodaterol (STIOLTO RESPIMAT ) 2.5-2.5 MCG/ACT AERS Inhale 2 puffs into the lungs daily. 01/28/24   Neysa Reggy BIRCH, MD    Allergies: Patient has no known  allergies.    Review of Systems  Respiratory:  Positive for shortness of breath.   All other systems reviewed and are negative.   Updated Vital Signs BP (!) 150/88   Pulse 97   Temp (!) 96.4 F (35.8 C) (Axillary)   Resp (!) 22   SpO2 92%   Physical Exam Vitals and nursing note reviewed.  Constitutional:      Appearance: He is well-developed.  HENT:     Head: Normocephalic and atraumatic.  Cardiovascular:     Rate and Rhythm: Normal rate and regular rhythm.     Heart sounds: No murmur heard. Pulmonary:     Effort: Pulmonary effort is normal. No respiratory distress.     Breath sounds: Normal breath sounds.  Abdominal:     Palpations: Abdomen is soft.     Tenderness: There is no abdominal tenderness. There is no guarding or rebound.  Musculoskeletal:        General: No swelling or tenderness.  Skin:    General: Skin is warm and dry.  Neurological:     Mental Status: He is alert and oriented to person, place, and time.  Psychiatric:        Behavior: Behavior normal.     (all labs ordered are listed, but only abnormal results are displayed) Labs Reviewed  RESP PANEL BY RT-PCR (RSV, FLU A&B, COVID)  [id number]  CBC WITH DIFFERENTIAL/PLATELET  BASIC METABOLIC PANEL WITH GFR  TROPONIN I (HIGH SENSITIVITY)    EKG: None  Radiology: No results found.   Procedures   Medications Ordered in the ED - No data to display                                  Medical Decision Making Amount and/or Complexity of Data Reviewed Labs: ordered.   Patient with history of COPD, squamous cell carcinoma of the lung, CHF, CKD here for evaluation of shortness of breath will confirm sleep. He was hypoxic for EMS on his home oxygen . After receiving two duo nebs prior to ED arrival his hypoxia and difficulty breathing have resolved. Lung exam at time of ED arrival with clear lungs bilaterally and no respiratory distress. Chest x-ray is stable. Patient care transferred pending labs, repeat  assessment. Patient does note that he does not take his as needed inhalers as he should.     Final diagnoses:  None    ED Discharge Orders     None          Griselda Norris, MD 05/09/24 [phone]

## 2024-05-09 NOTE — ED Provider Notes (Addendum)
  Physical Exam  BP 136/85   Pulse 83   Temp (!) 96.4 F (35.8 C) (Axillary)   Resp 19   SpO2 94%   Physical Exam  Procedures  Procedures  ED Course / MDM    Medical Decision Making Amount and/or Complexity of Data Reviewed Labs: ordered.  Risk Prescription drug management.   Assuming care of patient from Dr. Griselda.   Patient in the ED for shob. Has extensive medical hx. Recent admission for CHF, pericardial effusion. He received nebs and feels better.  Workup thus far shows - normal labs and CXR.  Concerning findings are as following - none Important pending results are - labs.  According to Dr. Griselda, plan is to reassess after the labs. We will ambulate him.    Patient had no complains, no concerns from the nursing side. Will continue to monitor.  Charlyn Sora, MD 05/09/24 0713   9:56 AM On ambulation, patient had his O2 sats dropping to 70s.  On my exam, he does have slight rales.  His chest x-ray which independently was interpreted by me, continues to show interstitial edema.  Patient has EF of 20%.  BNP is over thousand, which is more than doubled since his admission.  I think it would be best to optimize the patient further other than discharge and have him return sicker.   Charlyn Sora, MD 05/09/24 [phone]

## 2024-05-09 NOTE — H&P (Addendum)
 History and Physical    Patient: Jonathan [name]. FMW:[id number] DOB: 1936/01/09 DOA: 05/09/2024 DOS: the patient was seen and examined on 05/09/2024 PCP: [name], MD  Patient coming from: Home  Chief Complaint:  Chief Complaint  Patient presents with   Shortness of Breath   HPI: Jonathan [name]. is a 88 y.o. male with medical history significant of CAD s/p PCI to RCA/LAD in 12/2021, new HFrEF (EF 20-25% as of 05/06/2024; previously 55-60% in 09/2023), stage 3 SCC of LUL (dx 09/2023; s/p 7 cycles of carboplatin  for AUC of 2 and paclitaxel  45 Mg/M2 and last dose was given 12/23/2023; currently on consolidation immunotherapy with Imfinzi  1500 mg IV), COPD c/b chronic respiratory failure (2L [place]), HTN, HLD, PVD, and recent discharge from [facility] on 8/22 for ADHF re-presents with SOB and found to have ongoing HFrEF exacerbation.   Pt was in USOH until he awoke this morning around 0300 with shortness of breath. The patient reported awakening with acute SOB, but was notable able to lay flat in his bed. He became concerned, when he checked his pulse ox and it was dropping into the low 80s before recovering slightly; as such, he activated EMS. Of note, pt was recently admitted at [facility] for ADHF and treated with IV diuretics with BNP increasing from 500s to >1000 today. Pt does not endorse compliance with low salt diet and reported eating high salt meal after returning home yesterday including crab cakes.   In the ED, pt hypertensive, tachycardic, and tachypneic on 3L [place] (previously on RA pta). Labs notable for Cr 1.38 (baseline 1.1), WBC 10.7, BNP 1183.5, troponin 26-->38, and WBC 10.7. Procalcitonin pending. CXR showed vascular congestion, b/l pleural effusions, and known LUL mass. EDP requested medical admission for IV diuresis.  Review of Systems: As mentioned in the history of present illness. All other systems reviewed and are negative. Past Medical History:  Diagnosis Date   CAD (coronary artery  disease)    Two RCA stents remotely / 3rd RCA stent 2006   Colon polyps    s/p several Cscopes.   COPD (chronic obstructive pulmonary disease) (HCC)    on O2, nocturnal   Diabetes mellitus    dx aprox 2009   ED (erectile dysfunction)    has a vacumm device   Ejection fraction    Hyperlipidemia    dx in 90s   Hypertension    dx in the 31   Hypogonadism male    PVD (peripheral vascular disease) (HCC)    s/p stents at LE 2009, Dr Ladona   Shortness of breath    O2 Sat dropped to 82% walking on the treadmill, September, 2012   Past Surgical History:  Procedure Laterality Date   APPENDECTOMY     BRONCHIAL BIOPSY  10/01/2023   Procedure: BRONCHIAL BIOPSIES;  Surgeon: Brenna Adine CROME, DO;  Location: [facility] ENDOSCOPY;  Service: Pulmonary;;   BRONCHIAL BRUSHINGS  10/01/2023   Procedure: BRONCHIAL BRUSHINGS;  Surgeon: Brenna Adine CROME, DO;  Location: [facility] ENDOS[facility];  Service: Pulmonary;;   BRONCHIAL WASHINGS  10/01/2023   Procedure: BRONCHIAL WASHINGS;  Surgeon: Brenna Adine CROME, DO;  Location: [facility] ENDOSCOPY;  Service: Pulmonary;;   CORONARY STENT INTERVENTION N/A 01/12/2022   Procedure: CORONARY STENT INTERVENTION;  Surgeon: [place]az[place], Peter M, MD;  Location: [facility] INVASIVE CV LAB;  Service: Cardiovascular;  Laterality: N/A;   HEMOSTASIS CONTROL  10/01/2023   Procedure: HEMOSTASIS CONTROL;  Surgeon: Brenna Adine CROME, DO;  Location: [facility] ENDOSCOPY;  Service: Pulmonary;;   LEFT HEART CATH AND CORONARY ANGIOGRAPHY N/A 03/13/2023   Procedure: LEFT HEART CATH AND CORONARY ANGIOGRAPHY;  Surgeon: [place], Peter M, MD;  Location: [facility] INVASIVE CV LAB;  Service: Cardiovascular;  Laterality: N/A;   RIGHT/LEFT HEART CATH AND CORONARY ANGIOGRAPHY N/A 01/11/2022   Procedure: RIGHT/LEFT HEART CATH AND CORONARY ANGIOGRAPHY;  Surgeon: Claudene Victory ORN, MD;  Location: [facility] INVASIVE CV LAB;  Service: Cardiovascular;  Laterality: N/A;   TONSILLECTOMY     VIDEO BRONCHOSCOPY  10/01/2023   Procedure: VIDEO BRONCHOSCOPY WITHOUT FLUORO;   Surgeon: Brenna Adine CROME, DO;  Location: [facility] ENDOSCOPY;  Service: Pulmonary;;   Social History:  reports that he quit smoking about 46 years ago. His smoking use included cigarettes. He started smoking about 76 years ago. He has a 60 pack-year smoking history. He has never used smokeless tobacco. He reports that he does not currently use alcohol. He reports that he does not use drugs.  No Known Allergies  Family History  Problem Relation Age of Onset   Heart disease Father    Hypertension Father    Stroke Father    Diabetes Paternal Aunt    Diabetes Maternal Grandmother    Diabetes Other        GM, nephews, many family members   Hyperlipidemia Other        ?   Prostate cancer Brother    Colon cancer Neg Hx     Prior to Admission medications   Medication Sig Start Date End Date Taking? Authorizing Provider  albuterol  (VENTOLIN  HFA) 108 (90 Base) MCG/ACT inhaler Inhale 2 puffs into the lungs every 6 (six) hours as needed for wheezing or shortness of breath. 01/28/24   Neysa Reggy BIRCH, MD  benzonatate  (TESSALON ) 200 MG capsule Take 1 capsule (200 mg total) by mouth 3 (three) times daily as needed for cough. 04/14/24   Neysa Reggy D, MD  Blood Glucose Monitoring Suppl (FREESTYLE FREEDOM LITE) w/Device KIT Use Freestyle Freedom Lite meter to check blood sugar twice daily. DX:E11.65 11/17/21   Von [place], MD  clopidogrel  (PLAVIX ) 75 MG tablet Take 1 tablet (75 mg total) by mouth daily with breakfast. 10/01/23   [name], MD  empagliflozin  (JARDIANCE ) 25 MG TABS tablet Take 1 tablet (25 mg total) by mouth daily before breakfast. For diabetes E11.65 and E 11.29 08/01/23   Thapa, [place], MD  furosemide  (LASIX ) 20 MG tablet Take 1 tablet (20 mg total) by mouth daily. 05/09/24   Cindy Garnette POUR, MD  gabapentin  (NEURONTIN ) 300 MG capsule Take 300 mg by mouth 2 (two) times daily. 01/22/24   [provider]  glucose blood (FREESTYLE LITE) test strip USE AS INSTRUCTED 11/25/22   Von [place], MD   hydrOXYzine  (ATARAX ) 25 MG tablet Take 1 tablet by mouth at bedtime as needed for anxiety. 10/29/23   [provider]  metFORMIN  (GLUCOPHAGE ) 500 MG tablet Take 1 tablet (500 mg total) by mouth 2 (two) times daily with a meal. Patient taking differently: Take 500-1,000 mg by mouth 2 (two) times daily with a meal. Take one tablet in the morning and take two tablets in the evening 08/01/23   Thapa, [place], MD  metoprolol  tartrate (LOPRESSOR ) 100 MG tablet Take 1 tablet (100 mg total) by mouth 2 (two) times daily. 05/08/24 07/07/24  Cindy Garnette POUR, MD  Multiple Vitamin (MULTIVITAMIN WITH MINERALS) TABS tablet Take 1 tablet by mouth daily.    [provider]  OXYGEN  Inhale 2 L/min into the  lungs continuous.    [provider]  sacubitril -valsartan  (ENTRESTO ) 24-26 MG Take 1 tablet by mouth 2 (two) times daily. 02/22/23   Fairy Frames, MD  simvastatin  (ZOCOR ) 20 MG tablet Take 20 mg by mouth daily.    [provider]  spironolactone  (ALDACTONE ) 25 MG tablet Take 0.5 tablets (12.5 mg total) by mouth daily. 05/09/24 06/08/24  Cindy Garnette POUR, MD  tamsulosin  (FLOMAX ) 0.4 MG CAPS capsule Take 1 capsule (0.4 mg total) by mouth daily after supper. 10/11/23   Paz, Jose E, MD  Tiotropium Bromide -Olodaterol (STIOLTO RESPIMAT ) 2.5-2.5 MCG/ACT AERS Inhale 2 puffs into the lungs daily. 01/28/24   Neysa Rama D, MD    Physical Exam: Vitals:   05/09/24 0940 05/09/24 0955 05/09/24 1008 05/09/24 1148  BP: 128/83   (!) 142/107  Pulse: 78 84  87  Resp: (!) 21 17  16   Temp:   (!) 97.3 F (36.3 C) 97.6 F (36.4 C)  TempSrc:   Oral Oral  SpO2: 95% 97%  (!) 89%   General: Alert, oriented x3, resting comfortably in no acute distress Respiratory: Bibasilar rales; no wheezing Cardiovascular: Regular rate and rhythm w/o m/r/g   Data Reviewed:  Lab Results  Component Value Date   WBC 10.7 (H) 05/09/2024   HGB 14.5 05/09/2024   HCT 43.5 05/09/2024   MCV 98.4 05/09/2024   PLT  231 05/09/2024   Lab Results  Component Value Date   GLUCOSE 111 (H) 05/09/2024   CALCIUM  8.4 (L) 05/09/2024   NA 141 05/09/2024   K 4.6 05/09/2024   CO2 19 (L) 05/09/2024   CL 113 (H) 05/09/2024   BUN 30 (H) 05/09/2024   CREATININE 1.38 (H) 05/09/2024   Lab Results  Component Value Date   ALT 14 05/07/2024   AST 13 (L) 05/07/2024   ALKPHOS 59 05/07/2024   BILITOT 0.7 05/07/2024   Lab Results  Component Value Date   INR 1.1 09/10/2023   INR 1.1 09/09/2023   Radiology: DG Chest Portable 1 View Result Date: 05/09/2024 CLINICAL DATA:  Shortness of breath. EXAM: PORTABLE CHEST 1 VIEW COMPARISON:  Portable chest and chest CT with contrast both 05/06/2024. FINDINGS: 6:10 a.m. Stable cardiomegaly. left upper lobe perihilar known mass is not well seen on this radiograph. The heart silhouette likely exaggerated by pericardial effusion. There is perihilar vascular congestion, mild increased interstitial edema with a basal gradient. Small layering pleural effusions appear similar. No active airspace infiltrate is seen. Stable mediastinum with aortic tortuosity and calcification. No new osseous finding.  Multiple overlying telemetry leads. IMPRESSION: 1. Cardiomegaly with perihilar vascular congestion and mild increased interstitial edema. 2. Small layering pleural effusions appear similar. 3. Left upper lobe perihilar known mass is not well seen on this radiograph. No focal infiltrate. Electronically Signed   By: Francis Quam M.D.   On: 05/09/2024 06:21    Assessment and Plan: 4M h/o new HFrEF (EF 20-25% as of 05/06/2024; previously 55-60% in 09/2023), CAD s/p PCI to RCA/LAD in 12/2021, stage 3 SCC of LUL (dx 09/2023; s/p 7 cycles of carboplatin  for AUC of 2 and paclitaxel  45 Mg/M2 and last dose was given 12/23/2023; currently on consolidation immunotherapy with Imfinzi  1500 mg IV), COPD c/b chronic respiratory failure (2L [place]), HTN, HLD, PVD, and recent discharge from [facility] on 8/22 for ADHF re-presents  with SOB and found to have ongoing HFrEF exacerbation.   HFrEF Newly reduced EF from 60% to 20%; no cp; possible due to Imfinzi  but may need  ischemic w/u -Cards consulted; apprec eval/recs -RD consulted for low sodium diet teaching -IV lasix  40mg  BID for now; goal net neg 1-2L/d; strict I/Os; daily standing weights; K>4/Mg>2 -F/u procalcitonin to exclude infectious etiology; pt may need repeat CXR prior to d/c to r/o CAP; may consider CT chest WO as well -PTA Plavix , Entresto , and spironolactone  -Convert pta metoprolol  tartate 100mg  BID to metoprolol  succinate 200mg  daily given new HFrEF -Change pta simvastatin  20mg  daily to atorvastatin  40mg  daily for now pending lipid panel, namely LDL >70 given indication for high intensity statin -F/u lipid profile, A1c, and TSH for risk stratification  COPD Chronic hypoxic respiratory failure -[place]  neb BID, Incruse Ellipta  neb daily, and Duonebs prn -Supplemental O2 prn -Wean O2 as tolerated  -Ambulatory pulse ox prior to d/c  DM2 -PTA jardiance  alone for now; pending A1c  BPH -PTA flomax  0.4mg  daily   Advance Care Planning:   Code Status: Full Code   Consults: Cardiology  Family Communication: Sister  Severity of Illness: The appropriate patient status for this patient is INPATIENT. Inpatient status is judged to be reasonable and necessary in order to provide the required intensity of service to ensure the patient's safety. The patient's presenting symptoms, physical exam findings, and initial radiographic and laboratory data in the context of their chronic comorbidities is felt to place them at high risk for further clinical deterioration. Furthermore, it is not anticipated that the patient will be medically stable for discharge from the hospital within 2 midnights of admission.   * I certify that at the point of admission it is my clinical judgment that the patient will require inpatient hospital care spanning beyond 2 midnights from  the point of admission due to high intensity of service, high risk for further deterioration and high frequency of surveillance required.*   ------- I spent 55 minutes reviewing previous notes, at the bedside counseling/discussing the treatment plan, and performing clinical documentation.  Author: Marsha Ada, MD 05/09/2024 11:50 AM  For on call review [email or web address].

## 2024-05-09 NOTE — ED Triage Notes (Signed)
 Pt in with sob from home via [facility]. Pt recently d/c yesterday after admit for pericardial effusion, states he was awakened by an O2 alarm at his house tonight at 0200 and woke up sob. Pt usually wears 2LNC, hx of Lung CA and is actively receiving monthly infusions.   EMS arrived to find pt wheezing in all fields, sats of 76% on 2LNC. Placed on simple mask en route and sats came up to 98% - given 2 duonebs en route.

## 2024-05-10 ENCOUNTER — Telehealth: Payer: Self-pay | Admitting: Physician Assistant

## 2024-05-10 DIAGNOSIS — I5023 Acute on chronic systolic (congestive) heart failure: Secondary | ICD-10-CM | POA: Diagnosis not present

## 2024-05-10 DIAGNOSIS — I5043 Acute on chronic combined systolic (congestive) and diastolic (congestive) heart failure: Secondary | ICD-10-CM | POA: Diagnosis present

## 2024-05-10 LAB — BASIC METABOLIC PANEL WITH GFR
Anion gap: 7 (ref 5–15)
BUN: 27 mg/dL — ABNORMAL HIGH (ref 8–23)
CO2: 23 mmol/L (ref 22–32)
Calcium: 8 mg/dL — ABNORMAL LOW (ref 8.9–10.3)
Chloride: 108 mmol/L (ref 98–111)
Creatinine, Ser: 1.49 mg/dL — ABNORMAL HIGH (ref 0.61–1.24)
GFR, Estimated: 45 mL/min — ABNORMAL LOW (ref >60)
Glucose, Bld: 119 mg/dL — ABNORMAL HIGH (ref 70–99)
Potassium: 4.4 mmol/L (ref 3.5–5.1)
Sodium: 138 mmol/L (ref 135–145)

## 2024-05-10 MED ORDER — METOPROLOL SUCCINATE ER 100 MG PO TB24: 100.0000 mg | ORAL_TABLET | Freq: Two times a day (BID) | ORAL | 0 refills | Status: DC

## 2024-05-10 MED ORDER — ATORVASTATIN CALCIUM 40 MG PO TABS: 40.0000 mg | ORAL_TABLET | Freq: Every day | ORAL | 0 refills | Status: DC

## 2024-05-10 MED ORDER — FUROSEMIDE 40 MG PO TABS: 40.0000 mg | ORAL_TABLET | Freq: Every day | ORAL | 0 refills | Status: DC

## 2024-05-10 MED ORDER — CLOPIDOGREL BISULFATE 75 MG PO TABS: 75.0000 mg | ORAL_TABLET | Freq: Every day | ORAL | 0 refills | Status: DC

## 2024-05-10 MED ORDER — FUROSEMIDE 40 MG PO TABS
40.0000 mg | ORAL_TABLET | Freq: Every day | ORAL | Status: DC
  Administered 2024-05-10: 40 mg via ORAL
  Filled 2024-05-10: qty 1

## 2024-05-10 NOTE — Discharge Summary (Signed)
 Physician Discharge Summary   Patient: Jonathan [name]. MRN: [id number] DOB: 09-05-1936  Admit date:     05/09/2024  Discharge date: 05/10/2024  Discharge Physician: Yetta Blanch  PCP: [name], MD  Recommendations at discharge: Follow-up with PCP and cardiology as recommended. Repeat BMP in 1 week.   Follow-up Information     West, Katlyn D, NP Follow up on 05/22/2024.   Specialty: Cardiology Why: Hospital Follow Up Appt Time: 8:25 am Please arrive 15 min early. Bring all medications. Contact information: [address] [place] [address] [phone]         [name], MD. Schedule an appointment as soon as possible for a visit in 1 week(s).   Specialty: Internal Medicine Why: with BMP lab to look at kidney/electrolyte numbers Contact information: [address] [place] [address] [phone]                Discharge Diagnoses: Principal Problem:   Acute on chronic heart failure [facility]) Active Problems:   Acute on chronic combined systolic and diastolic congestive heart failure [facility])  Hospital Course: Patient was recently hospitalized 8/20 - 8/22 for respiratory failure. Was discharged home and after being home when he woke up next day he started having trouble breathing and shortness of breath and therefore come to the hospital. Reportedly ate a meal that was very heavy in salt. Treated with IV Lasix . Now improving. Per Cardiology can be discharged home.  Assessment and plan. Acute on chronic respiratory failure with hypoxia (HCC) Due to  acute on chronic combined systolic and diastolic CHF  Pericardial effusion Pleural effusion Treated with IV Lasix  Respiratory status improving. Cardiology consulted Currently recommending switch to oral Lasix  at a higher dose. Outpatient follow-up recommended. Will recommend BMP in 1 week. For GDMT changing metoprolol  to Toprol -XL. Sending another prescription for Plavix  since there  is no refill listed in the chart. Increasing Lasix  to 40 mg daily per cardiology recommendation. Switching pravastatin to atorvastatin .    CAD (coronary artery disease) PVD (peripheral vascular disease) (HCC) Continue beta-blocker, clopidogrel  and statin.   Essential hypertension  Continue furosemide  Entresto  and metoprolol  as above.   CKD stage 3b, GFR 30-44 ml/min (HCC) Normal renal function electrolytes.   Hyperlipidemia Now on Lipitor   Anxiety and depression Continue hydroxyzine  at bedtime. Patient also on tamsulosin , but no notes from urology. No BPH or another prostate related diagnosis in problem list.   Squamous cell carcinoma of upper lobe of left lung (HCC) Mass decreasing in size. Follow-up with oncology as scheduled.  Consultants:  Cardiology  Procedures performed:  None  DISCHARGE MEDICATION: Allergies as of 05/10/2024   No Known Allergies      Medication List     STOP taking these medications    metoprolol  tartrate 100 MG tablet Commonly known as: LOPRESSOR    simvastatin  20 MG tablet Commonly known as: ZOCOR        TAKE these medications    albuterol  108 (90 Base) MCG/ACT inhaler Commonly known as: VENTOLIN  HFA Inhale 2 puffs into the lungs every 6 (six) hours as needed for wheezing or shortness of breath.   atorvastatin  40 MG tablet Commonly known as: LIPITOR Take 1 tablet (40 mg total) by mouth daily. Start taking on: May 11, 2024   benzonatate  200 MG capsule Commonly known as: TESSALON  Take 1 capsule (200 mg total) by mouth 3 (three) times daily as needed for cough.   clopidogrel  75 MG tablet Commonly known  as: PLAVIX  Take 1 tablet (75 mg total) by mouth daily with breakfast. Start taking on: May 11, 2024   empagliflozin  25 MG Tabs tablet Commonly known as: Jardiance  Take 1 tablet (25 mg total) by mouth daily before breakfast. For diabetes E11.65 and E 11.29   Entresto  24-26 MG Generic drug: sacubitril -valsartan  Take  1 tablet by mouth 2 (two) times daily.   FreeStyle Freedom Lite w/Device Kit Use Freestyle Freedom Lite meter to check blood sugar twice daily. DX:E11.65   FREESTYLE LITE test strip Generic drug: glucose blood USE AS INSTRUCTED   furosemide  40 MG tablet Commonly known as: LASIX  Take 1 tablet (40 mg total) by mouth daily. Start taking on: May 11, 2024 What changed:  medication strength how much to take   gabapentin  300 MG capsule Commonly known as: NEURONTIN  Take 300 mg by mouth 2 (two) times daily.   hydrOXYzine  25 MG tablet Commonly known as: ATARAX  Take 1 tablet by mouth at bedtime as needed for anxiety.   metFORMIN  500 MG tablet Commonly known as: GLUCOPHAGE  Take 1 tablet (500 mg total) by mouth 2 (two) times daily with a meal. What changed:  how much to take additional instructions   metoprolol  succinate 100 MG 24 hr tablet Commonly known as: TOPROL -XL Take 1 tablet (100 mg total) by mouth in the morning and at bedtime. Take with or immediately following a meal.   multivitamin with minerals Tabs tablet Take 1 tablet by mouth daily.   OXYGEN  Inhale 2 L/min into the lungs continuous.   spironolactone  25 MG tablet Commonly known as: ALDACTONE  Take 0.5 tablets (12.5 mg total) by mouth daily.   Stiolto Respimat  2.5-2.5 MCG/ACT Aers Generic drug: Tiotropium Bromide -Olodaterol Inhale 2 puffs into the lungs daily.   tamsulosin  0.4 MG Caps capsule Commonly known as: FLOMAX  Take 1 capsule (0.4 mg total) by mouth daily after supper.               Durable Medical Equipment  (From admission, onward)           Start     Ordered   05/10/24 1119  For home use only DME oxygen   Once       Comments: Resuming home oxygen   Question Answer Comment  Length of Need Lifetime   Mode or (Route) Nasal cannula   Liters per Minute 2   Frequency Continuous (stationary and portable oxygen  unit needed)   Oxygen  delivery system Gas      05/10/24 1118            Disposition: Home Diet recommendation: Cardiac diet  Discharge Exam: Vitals:   05/09/24 2113 05/10/24 0407 05/10/24 0830 05/10/24 0834  BP:  132/84 111/69   Pulse:  71 70   Resp:  17 15   Temp:  97.7 F (36.5 C) (!) 97.5 F (36.4 C)   TempSrc:  Axillary Oral   SpO2: 94% 97% 96% 96%  Alert awake oriented x 3. No basal crackles S1-S2 present Bowel sound present No edema.  There were no vitals filed for this visit. Condition at discharge: stable  The results of significant diagnostics from this hospitalization (including imaging, microbiology, ancillary and laboratory) are listed below for reference.   Imaging Studies: DG Chest Portable 1 View Result Date: 05/09/2024 CLINICAL DATA:  Shortness of breath. EXAM: PORTABLE CHEST 1 VIEW COMPARISON:  Portable chest and chest CT with contrast both 05/06/2024. FINDINGS: 6:10 a.m. Stable cardiomegaly. left upper lobe perihilar known mass is not well seen on this  radiograph. The heart silhouette likely exaggerated by pericardial effusion. There is perihilar vascular congestion, mild increased interstitial edema with a basal gradient. Small layering pleural effusions appear similar. No active airspace infiltrate is seen. Stable mediastinum with aortic tortuosity and calcification. No new osseous finding.  Multiple overlying telemetry leads. IMPRESSION: 1. Cardiomegaly with perihilar vascular congestion and mild increased interstitial edema. 2. Small layering pleural effusions appear similar. 3. Left upper lobe perihilar known mass is not well seen on this radiograph. No focal infiltrate. Electronically Signed   By: Francis Quam M.D.   On: 05/09/2024 06:21   DG Chest Port 1 View Result Date: 05/06/2024 CLINICAL DATA:  Shortness of breath, decreased O2 sats. EXAM: PORTABLE CHEST 1 VIEW COMPARISON:  05/06/2024 and CT chest 05/06/2024. FINDINGS: Trachea is midline. Heart is enlarged. Masslike consolidation in the perihilar left upper lobe, better  evaluated on CT chest 05/06/2024. Similar left basilar coarsened airspace opacification. Minimal coarsening in the right lung base. Small bilateral pleural effusions. IMPRESSION: 1. Masslike collapse/consolidation in the left upper lobe with presumed changes of radiation therapy in the adjacent left lung, as before. Difficult to exclude superimposed pneumonia. 2. Small bilateral pleural effusions. Electronically Signed   By: Newell Eke M.D.   On: 05/06/2024 17:22   ECHOCARDIOGRAM COMPLETE Result Date: 05/06/2024    ECHOCARDIOGRAM REPORT   Patient Name:   Jonathan [name]. Date of Exam: 05/06/2024 Medical Rec #:  [id number]           Height:       74.0 in Accession #:    [id number]          Weight:       209.5 lb Date of Birth:  10/16/1935            BSA:          2.217 m Patient Age:    88 years            BP:           143/85 mmHg Patient Gender: M                   HR:           91 bpm. Exam Location:  Inpatient Procedure: 2D Echo, Cardiac Doppler and Color Doppler (Both Spectral and Color            Flow Doppler were utilized during procedure). Indications:    Pericardial Effusion  History:        Patient has prior history of Echocardiogram examinations.                 Pericardial Disease; Risk Factors:Hypertension.  Sonographer:    Vella Key Referring Phys: [id number] PETER C NISHAN IMPRESSIONS  1. Left ventricular ejection fraction, by estimation, is 20 to 25%. The left ventricle has severely decreased function. The left ventricle demonstrates global hypokinesis. There is moderate concentric left ventricular hypertrophy. Left ventricular diastolic parameters are indeterminate.  2. Right ventricular systolic function is normal. The right ventricular size is normal.  3. Left atrial size was mildly dilated.  4. There is a small circumfrential pericardial effusion with a moderate focal collection abutting the RA. There is mild invagination of the RA but no overt tamponade. a small pericardial effusion is present.  The pericardial effusion is circumferential and localized near the right atrium. There is no evidence of cardiac tamponade.  5. The mitral valve is normal in structure. Trivial mitral valve regurgitation. No evidence  of mitral stenosis.  6. The aortic valve is tricuspid. There is mild calcification of the aortic valve. Aortic valve regurgitation is mild. Aortic valve sclerosis/calcification is present, without any evidence of aortic stenosis.  7. Aortic dilatation noted. There is borderline dilatation of the aortic root, measuring 38 mm. There is borderline dilatation of the ascending aorta, measuring 38 mm.  8. The inferior vena cava is dilated in size with >50% respiratory variability, suggesting right atrial pressure of 8 mmHg. FINDINGS  Left Ventricle: Left ventricular ejection fraction, by estimation, is 20 to 25%. The left ventricle has severely decreased function. The left ventricle demonstrates global hypokinesis. The left ventricular internal cavity size was normal in size. There is moderate concentric left ventricular hypertrophy. Left ventricular diastolic parameters are indeterminate. Right Ventricle: The right ventricular size is normal. No increase in right ventricular wall thickness. Right ventricular systolic function is normal. Left Atrium: Left atrial size was mildly dilated. Right Atrium: Right atrial size was normal in size. Pericardium: There is a small circumfrential pericardial effusion with a moderate focal collection abutting the RA. There is mild invagination of the RA but no overt tamponade. A small pericardial effusion is present. The pericardial effusion is circumferential and localized near the right atrium. There is no evidence of cardiac tamponade. Mitral Valve: The mitral valve is normal in structure. Trivial mitral valve regurgitation. No evidence of mitral valve stenosis. Tricuspid Valve: The tricuspid valve is normal in structure. Tricuspid valve regurgitation is trivial. No  evidence of tricuspid stenosis. Aortic Valve: The aortic valve is tricuspid. There is mild calcification of the aortic valve. Aortic valve regurgitation is mild. Aortic valve sclerosis/calcification is present, without any evidence of aortic stenosis. Pulmonic Valve: The pulmonic valve was normal in structure. Pulmonic valve regurgitation is trivial. No evidence of pulmonic stenosis. Aorta: Aortic dilatation noted. There is borderline dilatation of the aortic root, measuring 38 mm. There is borderline dilatation of the ascending aorta, measuring 38 mm. Venous: The inferior vena cava is dilated in size with greater than 50% respiratory variability, suggesting right atrial pressure of 8 mmHg. IAS/Shunts: No atrial level shunt detected by color flow Doppler.  LEFT VENTRICLE PLAX 2D LVIDd:         4.70 cm      Diastology LVIDs:         4.10 cm      LV e' medial:    4.29 cm/s LV PW:         1.40 cm      LV E/e' medial:  27.0 LV IVS:        1.40 cm      LV e' lateral:   5.08 cm/s LVOT diam:     2.10 cm      LV E/e' lateral: 22.8 LV SV:         70 LV SV Index:   32 LVOT Area:     3.46 cm  LV Volumes (MOD) LV vol d, MOD A2C: 127.0 ml LV vol d, MOD A4C: 134.0 ml LV vol s, MOD A2C: 92.8 ml LV vol s, MOD A4C: 88.9 ml LV SV MOD A2C:     34.2 ml LV SV MOD A4C:     134.0 ml LV SV MOD BP:      41.2 ml RIGHT VENTRICLE RV Basal diam:  3.40 cm RV S prime:     13.10 cm/s TAPSE (M-mode): 3.1 cm LEFT ATRIUM             Index  RIGHT ATRIUM           Index LA diam:        3.40 cm 1.53 cm/m   RA Area:     18.00 cm LA Vol (A2C):   76.0 ml 34.28 ml/m  RA Volume:   47.90 ml  21.61 ml/m LA Vol (A4C):   52.0 ml 23.46 ml/m LA Biplane Vol: 70.1 ml 31.62 ml/m  AORTIC VALVE LVOT Vmax:   110.00 cm/s LVOT Vmean:  76.400 cm/s LVOT VTI:    0.203 m  AORTA Ao Root diam: 3.80 cm Ao Asc diam:  3.80 cm MITRAL VALVE MV Area (PHT): 8.43 cm     SHUNTS MV Decel Time: 90 msec      Systemic VTI:  0.20 m MV E velocity: 116.00 cm/s  Systemic Diam:  2.10 cm MV A velocity: 60.00 cm/s MV E/A ratio:  1.93 Toribio Fuel MD Electronically signed by Toribio Fuel MD Signature Date/Time: 05/06/2024/12:35:30 PM    Final    CT Chest W Contrast Addendum Date: 05/06/2024 ADDENDUM REPORT: 05/06/2024 10:11 ADDENDUM: The original report was by Dr. Ryan Salvage. The following addendum is by Dr. Ryan Salvage: These results were called by telephone at the time of interpretation on 05/06/2024 at 10:02 am to provider [facility] , who verbally acknowledged these results. Electronically Signed   By: Ryan Salvage M.D.   On: 05/06/2024 10:11   Result Date: 05/06/2024 CLINICAL DATA:  Left upper lobe non-small cell lung cancer, hypoxia and shortness of breath. * Tracking Code: BO * EXAM: CT CHEST WITH CONTRAST TECHNIQUE: Multidetector CT imaging of the chest was performed during intravenous contrast administration. RADIATION DOSE REDUCTION: This exam was performed according to the departmental dose-optimization program which includes automated exposure control, adjustment of the mA and/or kV according to patient size and/or use of iterative reconstruction technique. CONTRAST:  75mL OMNIPAQUE  IOHEXOL  300 MG/ML  SOLN COMPARISON:  01/16/2024 FINDINGS: Cardiovascular: Coronary, aortic arch, and branch vessel atherosclerotic vascular disease. Large and substantially increased pericardial effusion. Mediastinum/Nodes: Borderline prominence of right infrahilar nodal tissue. Lungs/Pleura: Moderate left and small right pleural effusions with passive atelectasis. Centrilobular emphysema. Hazy airspace opacity in the aerated portion of the left lower lobe for example on image 79 series 6, suspicious for pneumonia or pneumonitis. Residuum of the left upper lobe mass measures proximally 4.0 by 1.5 cm, previously 4.5 by 1.9 cm on 01/16/2024. Airway plugging of the apicoposterior segment left upper lobe. Right upper lobe subpleural nodule 0.6 cm in diameter on image 82 series  6, stable. Upper Abdomen: Left adrenal nodule 2.1 by 1.7 cm, stable by my measurements, indeterminate for malignancy versus benign etiology. Musculoskeletal: Unremarkable IMPRESSION: 1. Large and substantially increased pericardial effusion compared to 01/16/2024. Correlate with physical exam indicators of tamponade in deciding whether echocardiographic workup is indicated. 2. Moderate left and small right pleural effusions with passive atelectasis. 3. Hazy airspace opacity in the aerated portion of the left lower lobe suspicious for pneumonia or pneumonitis. 4. Residuum of the left upper lobe mass measures 4.0 by 1.5 cm, previously 4.5 by 1.9 cm on 01/16/2024. 5. Airway plugging of the apicoposterior segment left upper lobe. 6. Stable 0.6 cm right upper lobe subpleural nodule. 7. Stable left adrenal nodule, indeterminate for malignancy versus benign etiology (although chronicity dating back to 05/03/2010 tends to favor benign etiology). 8. Aortic Atherosclerosis (ICD10-I70.0) and Emphysema (ICD10-J43.9). Radiology assistant personnel have been notified to put me in telephone contact with the referring physician or the referring  physician's clinical representative in order to discuss these findings. Once this communication is established I will issue an addendum to this report for documentation purposes. Electronically Signed: By: Ryan Salvage M.D. On: 05/06/2024 09:52   DG Chest Port 1 View Result Date: 05/06/2024 CLINICAL DATA:  Shortness of breath. History of non-small-cell lung cancer. EXAM: PORTABLE CHEST 1 VIEW COMPARISON:  02/26/2024 FINDINGS: The cardio pericardial silhouette is enlarged. Interstitial markings are diffusely coarsened with chronic features. Hazy left parahilar opacity is similar to prior. The lungs are clear without focal pneumonia, edema, pneumothorax or pleural effusion. No acute bony abnormality. Telemetry leads overlie the chest. IMPRESSION: Stable. Chronic interstitial coarsening  without new or acute cardiopulmonary findings. Similar appearance hazy left parahilar opacity, region of patient's known lesion. Electronically Signed   By: Camellia Candle M.D.   On: 05/06/2024 07:26    Microbiology: Results for orders placed or performed during the hospital encounter of 05/09/24  Resp panel by RT-PCR (RSV, Flu A&B, Covid) Anterior Nasal Swab     Status: None   Collection Time: 05/09/24  6:10 AM   Specimen: Anterior Nasal Swab  Result Value Ref Range Status   SARS Coronavirus 2 by RT PCR NEGATIVE NEGATIVE Final   Influenza A by PCR NEGATIVE NEGATIVE Final   Influenza B by PCR NEGATIVE NEGATIVE Final    Comment: (NOTE) The Xpert Xpress SARS-CoV-2/FLU/RSV plus assay is intended as an aid in the diagnosis of influenza from Nasopharyngeal swab specimens and should not be used as a sole basis for treatment. Nasal washings and aspirates are unacceptable for Xpert Xpress SARS-CoV-2/FLU/RSV testing.  Fact Sheet for Patients: BloggerCourse.com  Fact Sheet for Healthcare Providers: SeriousBroker.it  This test is not yet approved or cleared by the [place]  FDA and has been authorized for detection and/or diagnosis of SARS-CoV-2 by FDA under an Emergency Use Authorization (EUA). This EUA will remain in effect (meaning this test can be used) for the duration of the COVID-19 declaration under Section 564(b)(1) of the Act, 21 U.S.C. section 360bbb-3(b)(1), unless the authorization is terminated or revoked.     Resp Syncytial Virus by PCR NEGATIVE NEGATIVE Final    Comment: (NOTE) Fact Sheet for Patients: BloggerCourse.com  Fact Sheet for Healthcare Providers: SeriousBroker.it  This test is not yet approved or cleared by the [place]  FDA and has been authorized for detection and/or diagnosis of SARS-CoV-2 by FDA under an Emergency Use Authorization (EUA). This EUA  will remain in effect (meaning this test can be used) for the duration of the COVID-19 declaration under Section 564(b)(1) of the Act, 21 U.S.C. section 360bbb-3(b)(1), unless the authorization is terminated or revoked.  Performed at [facility], [address] [place], [place] [address]    *Note: Due to a large number of results and/or encounters for the requested time period, some results have not been displayed. A complete set of results can be found in Results Review.   Labs: CBC: Recent Labs  Lab 05/06/24 0647 05/07/24 0617 05/09/24 0811  WBC 7.9 6.6 10.7*  NEUTROABS 4.6  --  8.1*  HGB 16.0 14.8 14.5  HCT 48.8 45.0 43.5  MCV 98.4 98.3 98.4  PLT 208 222 231   Basic Metabolic Panel: Recent Labs  Lab 05/06/24 0647 05/07/24 0617 05/08/24 0605 05/09/24 0610 05/10/24 0544  NA 137 139 138 141 138  K 3.6 3.7 3.5 4.6 4.4  CL 101 106 108 113* 108  CO2 23 22 22  19* 23  GLUCOSE 144* 189*  121* 111* 119*  BUN 23 26* 34* 30* 27*  CREATININE 1.37* 1.42* 1.31* 1.38* 1.49*  CALCIUM  8.9 8.7* 8.3* 8.4* 8.0*  MG 2.4  --   --   --   --   PHOS 3.5  --   --   --   --    Liver Function Tests: Recent Labs  Lab 05/06/24 0647 05/07/24 0617  AST 22 13*  ALT 15 14  ALKPHOS 71 59  BILITOT 0.9 0.7  PROT 7.5 6.7  ALBUMIN 3.5 3.1*   CBG: Recent Labs  Lab 05/07/24 1155 05/07/24 1615 05/07/24 2140 05/08/24 0846 05/08/24 1139  GLUCAP 124* 212* 174* 149* 168*    Discharge time spent: greater than 30 minutes.  Author: Yetta Blanch, MD  [facility] 05/10/2024

## 2024-05-10 NOTE — Progress Notes (Addendum)
  Progress Note  Patient Name: Jonathan [name]. Date of Encounter: 05/10/2024 [facility] Cardiologist: Dorn Lesches, MD   Interval Summary   No acute overnight events. Patient reports feeling relatively well. Denies any SOB. Was able to lay flat in bed. Able to ambulate in room. No new or acute complaints.   Vital Signs Vitals:   05/09/24 1156 05/09/24 2007 05/09/24 2113 05/10/24 0407  BP:  (!) 142/89  132/84  Pulse:  81  71  Resp:  18  17  Temp: 97.8 F (36.6 C) 97.6 F (36.4 C)  97.7 F (36.5 C)  TempSrc: Oral Oral  Axillary  SpO2: (!) 88% 91% 94% 97%    Intake/Output Summary (Last 24 hours) at 05/10/2024 0812 Last data filed at 05/10/2024 0600 Gross per 24 hour  Intake 770 ml  Output 1800 ml  Net -1030 ml      05/07/2024    8:57 AM 05/06/2024    8:17 PM 05/06/2024   12:42 PM  Last 3 Weights  Weight (lbs) 201 lb 15.1 oz 201 lb 4.5 oz 198 lb 11.2 oz  Weight (kg) 91.6 kg 91.3 kg 90.13 kg    Physical Exam  General: Well developed, in no acute distress.  Neck: No JVD.  Cardiac: Normal rate, regular rhythm.  Resp: Normal work of breathing.  Ext: No edema.  Neuro: No gross focal deficits.  Psych: Normal affect.   Assessment & Plan  Mr. Burak is an 88 year old male with past medical history notable for CAD status post PCI and chronic systolic heart failure, LVEF 40% in 2024, now 20 to 25% who presented to the ED after an episode of paroxysmal nocturnal dyspnea.  He was found to have elevated NT proBNP.  Likely mild decompensation of heart failure, which was secondary to not taking medications and dietary indiscretion following recent hospital discharge.   #.  Acute on chronic systolic heart failure: I suspect etiology of his heart failure is mixed, from ischemic heart disease and also chemotherapy.  LVEF was 40% in 2024 and has declined to 20 to 25% currently, and he received chemotherapy between these 2 studies.  Decline in LVEF has been associated with 2  hospitalizations now for acute decompensated heart failure.  - Continue GDMT regimen of metoprolol  200 mg daily, Entresto  24-26 mg twice daily, spironolactone  12.5 mg daily, empagliflozin  25mg . - Appears euvolemic today. Stop IV lasix  and transition to PO lasix  40mg  daily. Give this morning and assess UOP into the afternoon. If adequate, then okay to discharge home with cardiology follow up. We will arrange for follow up with APP in 7-10 days with labs.   #.  CAD status post PCI: Denies chest pain. - Continue Plavix  75 mg daily and atorvastatin  40 mg daily.  For questions or updates, please contact [facility] Please consult [email or web address] for contact info under       Signed, Fonda Kitty, MD

## 2024-05-10 NOTE — Care Management CC44 (Signed)
 Condition Code 44 Documentation Completed  Patient Details  Name: Jonathan [name]. MRN: [id number] Date of Birth: 1936-03-11   Condition Code 44 given:  Yes Patient signature on Condition Code 44 notice:  Yes Documentation of 2 MD's agreement:  Yes Code 44 added to claim:  Yes    Robynn Eileen Hoose, RN 05/10/2024, 11:55 AM

## 2024-05-10 NOTE — Plan of Care (Signed)
  Problem: Education: Goal: Knowledge of General Education information will improve Description: Including pain rating scale, medication(s)/side effects and non-pharmacologic comfort measures Outcome: Progressing   Problem: Health Behavior/Discharge Planning: Goal: Ability to manage health-related needs will improve Outcome: Progressing   Problem: Clinical Measurements: Goal: Ability to maintain clinical measurements within normal limits will improve Outcome: Progressing Goal: Respiratory complications will improve Outcome: Progressing   Problem: Nutrition: Goal: Adequate nutrition will be maintained Outcome: Progressing   Problem: Safety: Goal: Ability to remain free from injury will improve Outcome: Progressing

## 2024-05-10 NOTE — Care Management Obs Status (Signed)
 MEDICARE OBSERVATION STATUS NOTIFICATION   Patient Details  Name: Jonathan [name]. MRN: [id number] Date of Birth: May 14, 1936   Medicare Observation Status Notification Given:  Yes    Robynn Eileen Hoose, RN 05/10/2024, 11:40 AM

## 2024-05-10 NOTE — TOC Transition Note (Signed)
 Transition of Care [facility]) - Discharge Note   Patient Details  Name: Jonathan [name]. MRN: [id number] Date of Birth: 11-07-1935  Transition of Care [facility]) CM/SW Contact:  Robynn Eileen Hoose, RN Phone Number: 05/10/2024, 11:57 AM   Clinical Narrative:   Patient is being discharged today. Spoke with patient at bedside, confirmed that he has home oxygen  and portable tank available. Family to transport patient home.    Final next level of care: Home/Self Care Barriers to Discharge: No Barriers Identified   Patient Goals and CMS Choice            Discharge Placement                       Discharge Plan and Services Additional resources added to the After Visit Summary for                                       Social Drivers of Health (SDOH) Interventions SDOH Screenings   Food Insecurity: No Food Insecurity (05/09/2024)  Housing: Low Risk  (05/09/2024)  Transportation Needs: No Transportation Needs (05/09/2024)  Utilities: Not At Risk (05/09/2024)  Alcohol Screen: Low Risk  (12/17/2023)  Depression (PHQ2-9): Low Risk  (04/29/2024)  Financial Resource Strain: Low Risk  (12/17/2023)  Physical Activity: Inactive (12/17/2023)  Social Connections: Moderately Isolated (05/09/2024)  Stress: No Stress Concern Present (12/17/2023)  Tobacco Use: Medium Risk (05/06/2024)  Health Literacy: Adequate Health Literacy (12/17/2023)     Readmission Risk Interventions     No data to display

## 2024-05-11 ENCOUNTER — Telehealth: Payer: Self-pay | Admitting: *Deleted

## 2024-05-11 NOTE — Telephone Encounter (Signed)
 Patient contacted regarding discharge from [place] on 05/10/24 Patient understands to follow up with provider west on 05/22/24 at 8:25 am at [address]. Patient understands discharge instructions? yes Patient understands medications and regiment? yes Patient understands to bring all medications to this visit? yes  Ask patient:  Are you enrolled in My Chart yes If no ask patient if they would like to enroll.

## 2024-05-11 NOTE — Transitions of Care (Post Inpatient/ED Visit) (Signed)
 05/11/2024  Name: Jonathan [name]. MRN: [id number] DOB: May 29, 1936  Today's TOC FU Call Status: Today's TOC FU Call Status:: Successful TOC FU Call Completed TOC FU Call Complete Date: 05/11/24 Patient's Name and Date of Birth confirmed.  Transition Care Management Follow-up Telephone Call Date of Discharge: 05/10/24 Discharge Facility: [facility]) Type of Discharge: Inpatient Admission Primary Inpatient Discharge Diagnosis:: Acute on chronic heart failure How have you been since you were released from the hospital?: Better Any questions or concerns?: Yes Patient Questions/Concerns:: Patient was confused about what medications to take. Per patient he was told in hospital not to take his infusion because of CHF. Patient will contact oncology and cardiologist to verify information. Patient Questions/Concerns Addressed: Other: (RN went through all medicatications and had him to make a take pile. RN discussed what each medication was for. RN discussed the appropiate way to discard medications not needed)  Items Reviewed: Did you receive and understand the discharge instructions provided?: No Medications obtained,verified, and reconciled?: Yes (Medications Reviewed) Any new allergies since your discharge?: No Dietary orders reviewed?: Yes Type of Diet Ordered:: low sodium heart healthy Do you have support at home?: Yes People in Home [RPT]: spouse Name of Support/Comfort Primary Source: Jonathan [name]  Medications Reviewed Today: Medications Reviewed Today     Reviewed by Kennieth Cathlean DEL, RN (Case Manager) on 05/11/24 at 1249  Med List Status: <None>   Medication Order Taking? Sig Documenting Provider Last Dose Status Informant  albuterol  (VENTOLIN  HFA) 108 (90 Base) MCG/ACT inhaler [id number] Yes Inhale 2 puffs into the lungs every 6 (six) hours as needed for wheezing or shortness of breath. Jonathan Reggy BIRCH, [name]  Active Self, Other, Pharmacy Records, Multiple Informants   atorvastatin  (LIPITOR) 40 MG tablet [id number] Yes Take 1 tablet (40 mg total) by mouth daily. Jonathan Yetta HERO, [name]  Active   benzonatate  (TESSALON ) 200 MG capsule [id number] Yes Take 1 capsule (200 mg total) by mouth 3 (three) times daily as needed for cough. Jonathan Reggy BIRCH, [name]  Active Self, Other, Pharmacy Records, Multiple Informants  Blood Glucose Monitoring Suppl (FREESTYLE FREEDOM LITE) w/Device KIT [id number] Yes Use Freestyle Freedom Lite meter to check blood sugar twice daily. Jonathan [place], [name]  Active Self, Pharmacy Records, Other, Multiple Informants  clopidogrel  (PLAVIX ) 75 MG tablet [id number] Yes Take 1 tablet (75 mg total) by mouth daily with breakfast. Jonathan Yetta HERO, [name]  Active   empagliflozin  (JARDIANCE ) 25 MG TABS tablet [id number] Yes Take 1 tablet (25 mg total) by mouth daily before breakfast. For diabetes E11.65 and E 11.29 Jonathan [name]  Active Self, Pharmacy Records, Other, Multiple Informants  furosemide  (LASIX ) 40 MG tablet [id number] Yes Take 1 tablet (40 mg total) by mouth daily. Jonathan Yetta HERO, [name]  Active   gabapentin  (NEURONTIN ) 300 MG capsule [id number] Yes Take 300 mg by mouth 2 (two) times daily. Provider, Historical, [name]  Active Self, Other, Pharmacy Records, Multiple Informants  glucose blood (FREESTYLE LITE) test strip [id number] Yes USE AS INSTRUCTED [place], [name]  Active Self, Pharmacy Records, Other, Multiple Informants  hydrOXYzine  (ATARAX ) 25 MG tablet [id number] Yes Take 1 tablet by mouth at bedtime as needed for anxiety. Provider, Historical, [name]  Active Self, Pharmacy Records, Other, Multiple Informants  metFORMIN  (GLUCOPHAGE ) 500 MG tablet [id number] Yes Take 1 tablet (500 mg total) by mouth 2 (two) times daily with a meal.  Patient taking differently: Take 500-1,000 mg by mouth 2 (two) times daily with a meal. Take one tablet  in the morning and take two tablets in the evening   Jonathan [name]  Active Self, Pharmacy Records, Other, Multiple Informants   metoprolol  succinate (TOPROL -XL) 100 MG 24 hr tablet [id number] Yes Take 1 tablet (100 mg total) by mouth in the morning and at bedtime. Take with or immediately following a meal. Jonathan Yetta HERO, [name]  Active   Multiple Vitamin (MULTIVITAMIN WITH MINERALS) TABS tablet [id number] Yes Take 1 tablet by mouth daily. Provider, Historical, [name]  Active Self, Pharmacy Records, Other, Multiple Informants  OXYGEN  [id number] Yes Inhale 2 L/min into the lungs continuous. Provider, Historical, [name]  Active Self, Pharmacy Records, Other, Multiple Informants           Med Note (CANTER, LILA D   Wed Apr 29, 2024 12:56 PM)    sacubitril -valsartan  (ENTRESTO ) 24-26 MG [id number] Yes Take 1 tablet by mouth 2 (two) times daily. Jonathan Frames, [name]  Active Self, Pharmacy Records, Other, Multiple Informants  spironolactone  (ALDACTONE ) 25 MG tablet [id number] Yes Take 0.5 tablets (12.5 mg total) by mouth daily. Jonathan Garnette POUR, [name]  Active   tamsulosin  (FLOMAX ) 0.4 MG CAPS capsule [id number] Yes Take 1 capsule (0.4 mg total) by mouth daily after supper. Jonathan Aloysius BRAVO, [name]  Active Self, Pharmacy Records, Other, Multiple Informants  Tiotropium Bromide -Olodaterol (STIOLTO RESPIMAT ) 2.5-2.5 MCG/ACT AERS [id number] Yes Inhale 2 puffs into the lungs daily. Jonathan Reggy BIRCH, [name]  Active Self, Other, Pharmacy Records, Multiple Informants  Med List Note Jonathan [name], CPhT 05/06/24 1403): [facility] [employer] Care and Equipment/Supplies: Were Home Health Services Ordered?: NA Any new equipment or medical supplies ordered?: NA  Functional Questionnaire: Do you need assistance with bathing/showering or dressing?: No Do you need assistance with meal preparation?: Yes Do you need assistance with eating?: No Do you have difficulty maintaining continence: No Do you need assistance with getting out of bed/getting out of a chair/moving?: No Do you have difficulty managing or taking your medications?: No  Follow up  appointments reviewed: PCP Follow-up appointment confirmed?: No [name] Provider Line Number:[phone] Given: Yes (Patient will call and schedule appt with PCP) Specialist Hospital Follow-up appointment confirmed?: Yes Date of Specialist follow-up appointment?: 05/21/24 Follow-Up Specialty Provider:: Cassandra Heilingoetter/ [id number] [place] Do you need transportation to your follow-up appointment?: No Do you understand care options if your condition(s) worsen?: Yes-patient verbalized understanding  SDOH Interventions Today    Flowsheet Row Most Recent Value  SDOH Interventions   Food Insecurity Interventions Intervention Not Indicated  Housing Interventions Intervention Not Indicated  Transportation Interventions Intervention Not Indicated, Patient Resources (Friends/Family)  Utilities Interventions Intervention Not Indicated    Goals Addressed             This Visit's Progress    [facility] Transitions of Care (TOC) Care Plan       Problems:  Recent Hospitalization for treatment of CHF Knowledge Deficit Related to Congestive Heart Failure and Medication management barrier Patient is getting medications for [facility] and PCP, cardiologist and gets confused with which ones to take.  Goal:  Over the next 30 days, the patient will not experience hospital readmission  Interventions:   Heart Failure Interventions: Provided education on low sodium diet Reviewed role of diuretics in prevention of fluid overload and management of heart failure; Discussed the importance of keeping all appointments with provider  Patient Self Care Activities:  Attend all scheduled provider appointments Call pharmacy for medication refills 3-7 days  in advance of running out of medications Call provider office for new concerns or questions  Notify RN Care Manager of TOC call rescheduling needs Participate in Transition of Care Program/Attend TOC scheduled calls Perform all self care activities  independently  Take medications as prescribed   use salt in moderation watch for swelling in feet, ankles and legs every day track symptoms and what helps feel better or worse  Plan:  An initial telephone outreach has been scheduled for: [id number]  Next PCP appointment scheduled for: Patient will call for appointment  Telephone follow up appointment with care management team member scheduled for:  Wanda Balboa [phone] 11:00       Patient will contact cardiologist and oncologist regarding questions on upcoming infusions.   Cathlean Headland BSN RN [facility] Care Management Coordinator [email or web address] Direct Dial: [phone]  Fax: [phone] Website: [email or web address]

## 2024-05-13 ENCOUNTER — Ambulatory Visit (HOSPITAL_COMMUNITY)

## 2024-05-14 NOTE — Progress Notes (Unsigned)
 [facility] OFFICE PROGRESS NOTE  Jonathan Aloysius BRAVO, MD [address] [place] [address]  DIAGNOSIS: Stage IIIa (T3, N2, M0) non-small cell lung cancer, squamous cell carcinoma presented with left upper lobe lung mass abutting the pleura and ipsilateral mediastinal and left hilar lymph nodes. The left adrenal nodule is mildly FDG avid and indeterminate. Diagnosed in January 2025.   PRIOR THERAPY: Concurrent chemoradiation with weekly carboplatin  for AUC of 2 and paclitaxel  45 Mg/M2.  Status post 7 cycles.  Last dose was given 12/23/2023.  CURRENT THERAPY: onsolidation immunotherapy with Imfinzi  1500 Mg IV every 4 weeks.  First dose 01/28/2024.  Status post 4 cycles. ***  INTERVAL HISTORY: Jonathan [name]. 88 y.o. male returns to the clinic today for a follow up visit. The patient was last seen in the clinic by Dr. Sherrod on 04/22/24.   The patient is currently undergoing consolidation immunotherapy with Imfinzi  1500 mg IV every 4 weeks.   In the interval since last being seen, he has been hospitalized twice. He was hospitalized from 05/06/24-05/08/24. The patient was found to have acute on chronic combined CHF and respiratory failure. Of note, his EF in January 2025 was 50-55%. His EF on 05/06/24 was 20-25%.   The patient is scheduled to follow up with cardiology on ***  Since being discharged, the patient is feeling ***.  He is feeling well except for intermittent dry cough. He had a cough with chemo/radiation but he states it is a little worse. He denies signs or symptoms of infection at this time. He denies any fever, chills, or night sweats. He denies changes with shortness of breath. He states he is active and mows his lawn.    *** He does have supplemental oxygen  at home that he may need to wear during the day as needed and at night***. He denies any hemoptysis or chest pain.   Denies any nausea, vomiting, diarrhea, or constipation.  Denies any headache or visual  changes. He has some rash on his back from where he had radiation. He has lotion. He had a repeat CT of the chest on 05/06/24 .He is here today for evaluation and repeat blood work before undergoing cycle #5    MEDICAL HISTORY: Past Medical History:  Diagnosis Date   CAD (coronary artery disease)    Two RCA stents remotely / 3rd RCA stent 2006   Colon polyps    s/p several Cscopes.   COPD (chronic obstructive pulmonary disease) (HCC)    on O2, nocturnal   Diabetes mellitus    dx aprox 2009   ED (erectile dysfunction)    has a vacumm device   Ejection fraction    Hyperlipidemia    dx in 90s   Hypertension    dx in the 48   Hypogonadism male    PVD (peripheral vascular disease) (HCC)    s/p stents at LE 2009, Dr Ladona   Shortness of breath    O2 Sat dropped to 82% walking on the treadmill, September, 2012    ALLERGIES:  has no known allergies.  MEDICATIONS:  Current Outpatient Medications  Medication Sig Dispense Refill   albuterol  (VENTOLIN  HFA) 108 (90 Base) MCG/ACT inhaler Inhale 2 puffs into the lungs every 6 (six) hours as needed for wheezing or shortness of breath. 54 g 3   atorvastatin  (LIPITOR) 40 MG tablet Take 1 tablet (40 mg total) by mouth daily. 30 tablet 0   benzonatate  (TESSALON ) 200  MG capsule Take 1 capsule (200 mg total) by mouth 3 (three) times daily as needed for cough. 30 capsule 4   Blood Glucose Monitoring Suppl (FREESTYLE FREEDOM LITE) w/Device KIT Use Freestyle Freedom Lite meter to check blood sugar twice daily. DX:E11.65 1 kit 0   clopidogrel  (PLAVIX ) 75 MG tablet Take 1 tablet (75 mg total) by mouth daily with breakfast. 30 tablet 0   empagliflozin  (JARDIANCE ) 25 MG TABS tablet Take 1 tablet (25 mg total) by mouth daily before breakfast. For diabetes E11.65 and E 11.29 90 tablet 3   furosemide  (LASIX ) 40 MG tablet Take 1 tablet (40 mg total) by mouth daily. 30 tablet 0   gabapentin  (NEURONTIN ) 300 MG capsule Take 300 mg by mouth 2 (two) times daily.      glucose blood (FREESTYLE LITE) test strip USE AS INSTRUCTED 100 strip 11   hydrOXYzine  (ATARAX ) 25 MG tablet Take 1 tablet by mouth at bedtime as needed for anxiety.     metFORMIN  (GLUCOPHAGE ) 500 MG tablet Take 1 tablet (500 mg total) by mouth 2 (two) times daily with a meal. (Patient taking differently: Take 500-1,000 mg by mouth 2 (two) times daily with a meal. Take one tablet in the morning and take two tablets in the evening) 180 tablet 3   metoprolol  succinate (TOPROL -XL) 100 MG 24 hr tablet Take 1 tablet (100 mg total) by mouth in the morning and at bedtime. Take with or immediately following a meal. 60 tablet 0   Multiple Vitamin (MULTIVITAMIN WITH MINERALS) TABS tablet Take 1 tablet by mouth daily.     OXYGEN  Inhale 2 L/min into the lungs continuous.     sacubitril -valsartan  (ENTRESTO ) 24-26 MG Take 1 tablet by mouth 2 (two) times daily. 60 tablet 0   spironolactone  (ALDACTONE ) 25 MG tablet Take 0.5 tablets (12.5 mg total) by mouth daily. 15 tablet 0   tamsulosin  (FLOMAX ) 0.4 MG CAPS capsule Take 1 capsule (0.4 mg total) by mouth daily after supper. 90 capsule 1   Tiotropium Bromide -Olodaterol (STIOLTO RESPIMAT ) 2.5-2.5 MCG/ACT AERS Inhale 2 puffs into the lungs daily. 12 g 4   No current facility-administered medications for this visit.    SURGICAL HISTORY:  Past Surgical History:  Procedure Laterality Date   APPENDECTOMY     BRONCHIAL BIOPSY  10/01/2023   Procedure: BRONCHIAL BIOPSIES;  Surgeon: Brenna Adine CROME, DO;  Location: [facility];  Service: Pulmonary;;   BRONCHIAL BRUSHINGS  10/01/2023   Procedure: BRONCHIAL BRUSHINGS;  Surgeon: Brenna Adine CROME, DO;  Location: [facility];  Service: Pulmonary;;   BRONCHIAL WASHINGS  10/01/2023   Procedure: BRONCHIAL WASHINGS;  Surgeon: Brenna Adine CROME, DO;  Location: [facility];  Service: Pulmonary;;   CORONARY STENT INTERVENTION N/A 01/12/2022   Procedure: CORONARY STENT INTERVENTION;  Surgeon: Swaziland, Peter M, MD;  Location: [facility]  INVASIVE CV LAB;  Service: Cardiovascular;  Laterality: N/A;   HEMOSTASIS CONTROL  10/01/2023   Procedure: HEMOSTASIS CONTROL;  Surgeon: Brenna Adine CROME, DO;  Location: [facility];  Service: Pulmonary;;   LEFT HEART CATH AND CORONARY ANGIOGRAPHY N/A 03/13/2023   Procedure: LEFT HEART CATH AND CORONARY ANGIOGRAPHY;  Surgeon: Swaziland, Peter M, MD;  Location: [facility] INVASIVE CV LAB;  Service: Cardiovascular;  Laterality: N/A;   RIGHT/LEFT HEART CATH AND CORONARY ANGIOGRAPHY N/A 01/11/2022   Procedure: RIGHT/LEFT HEART CATH AND CORONARY ANGIOGRAPHY;  Surgeon: Claudene Victory ORN, MD;  Location: [facility] INVASIVE CV LAB;  Service: Cardiovascular;  Laterality: N/A;   TONSILLECTOMY     VIDEO BRONCHOSCOPY  10/01/2023   Procedure: VIDEO BRONCHOSCOPY WITHOUT FLUORO;  Surgeon: Brenna Adine CROME, DO;  Location: [facility];  Service: Pulmonary;;    REVIEW OF SYSTEMS:   Review of Systems  Constitutional: Negative for appetite change, chills, fatigue, fever and unexpected weight change.  HENT:   Negative for mouth sores, nosebleeds, sore throat and trouble swallowing.   Eyes: Negative for eye problems and icterus.  Respiratory: Negative for cough, hemoptysis, shortness of breath and wheezing.   Cardiovascular: Negative for chest pain and leg swelling.  Gastrointestinal: Negative for abdominal pain, constipation, diarrhea, nausea and vomiting.  Genitourinary: Negative for bladder incontinence, difficulty urinating, dysuria, frequency and hematuria.   Musculoskeletal: Negative for back pain, gait problem, neck pain and neck stiffness.  Skin: Negative for itching and rash.  Neurological: Negative for dizziness, extremity weakness, gait problem, headaches, light-headedness and seizures.  Hematological: Negative for adenopathy. Does not bruise/bleed easily.  Psychiatric/Behavioral: Negative for confusion, depression and sleep disturbance. The patient is not nervous/anxious.     PHYSICAL EXAMINATION:  There were no vitals  taken for this visit.  ECOG PERFORMANCE STATUS: {CHL ONC ECOG [id number]  Physical Exam  Constitutional: Oriented to person, place, and time and well-developed, well-nourished, and in no distress. No distress.  HENT:  Head: Normocephalic and atraumatic.  Mouth/Throat: Oropharynx is clear and moist. No oropharyngeal exudate.  Eyes: Conjunctivae are normal. Right eye exhibits no discharge. Left eye exhibits no discharge. No scleral icterus.  Neck: Normal range of motion. Neck supple.  Cardiovascular: Normal rate, regular rhythm, normal heart sounds and intact distal pulses.   Pulmonary/Chest: Effort normal and breath sounds normal. No respiratory distress. No wheezes. No rales.  Abdominal: Soft. Bowel sounds are normal. Exhibits no distension and no mass. There is no tenderness.  Musculoskeletal: Normal range of motion. Exhibits no edema.  Lymphadenopathy:    No cervical adenopathy.  Neurological: Alert and oriented to person, place, and time. Exhibits normal muscle tone. Gait normal. Coordination normal.  Skin: Skin is warm and dry. No rash noted. Not diaphoretic. No erythema. No pallor.  Psychiatric: Mood, memory and judgment normal.  Vitals reviewed.  LABORATORY DATA: Lab Results  Component Value Date   WBC 10.7 (H) 05/09/2024   HGB 14.5 05/09/2024   HCT 43.5 05/09/2024   MCV 98.4 05/09/2024   PLT 231 05/09/2024      Chemistry      Component Value Date/Time   NA 138 05/10/2024 0544   NA 144 01/01/2024 0000   K 4.4 05/10/2024 0544   CL 108 05/10/2024 0544   CO2 23 05/10/2024 0544   BUN 27 (H) 05/10/2024 0544   BUN 14 01/01/2024 0000   CREATININE 1.49 (H) 05/10/2024 0544   CREATININE 1.14 04/22/2024 1050   CREATININE 1.04 11/28/2023 0803   GLU 87 01/01/2024 0000      Component Value Date/Time   CALCIUM  8.0 (L) 05/10/2024 0544   ALKPHOS 59 05/07/2024 0617   AST 13 (L) 05/07/2024 0617   AST 14 (L) 04/22/2024 1050   ALT 14 05/07/2024 0617   ALT 10 04/22/2024 1050    BILITOT 0.7 05/07/2024 0617   BILITOT 0.5 04/22/2024 1050       RADIOGRAPHIC STUDIES:  DG Chest Portable 1 View Result Date: 05/09/2024 CLINICAL DATA:  Shortness of breath. EXAM: PORTABLE CHEST 1 VIEW COMPARISON:  Portable chest and chest CT with contrast both 05/06/2024. FINDINGS: 6:10 a.m. Stable cardiomegaly. left upper lobe perihilar known mass is not well seen on this radiograph. The heart silhouette  likely exaggerated by pericardial effusion. There is perihilar vascular congestion, mild increased interstitial edema with a basal gradient. Small layering pleural effusions appear similar. No active airspace infiltrate is seen. Stable mediastinum with aortic tortuosity and calcification. No new osseous finding.  Multiple overlying telemetry leads. IMPRESSION: 1. Cardiomegaly with perihilar vascular congestion and mild increased interstitial edema. 2. Small layering pleural effusions appear similar. 3. Left upper lobe perihilar known mass is not well seen on this radiograph. No focal infiltrate. Electronically Signed   By: Francis Quam M.D.   On: 05/09/2024 06:21   DG Chest Port 1 View Result Date: 05/06/2024 CLINICAL DATA:  Shortness of breath, decreased O2 sats. EXAM: PORTABLE CHEST 1 VIEW COMPARISON:  05/06/2024 and CT chest 05/06/2024. FINDINGS: Trachea is midline. Heart is enlarged. Masslike consolidation in the perihilar left upper lobe, better evaluated on CT chest 05/06/2024. Similar left basilar coarsened airspace opacification. Minimal coarsening in the right lung base. Small bilateral pleural effusions. IMPRESSION: 1. Masslike collapse/consolidation in the left upper lobe with presumed changes of radiation therapy in the adjacent left lung, as before. Difficult to exclude superimposed pneumonia. 2. Small bilateral pleural effusions. Electronically Signed   By: Newell Eke M.D.   On: 05/06/2024 17:22   ECHOCARDIOGRAM COMPLETE Result Date: 05/06/2024    ECHOCARDIOGRAM REPORT    Patient Name:   Keano Guggenheim. Date of Exam: 05/06/2024 Medical Rec #:  [id number]           Height:       74.0 in Accession #:    [id number]          Weight:       209.5 lb Date of Birth:  01/10/1936            BSA:          2.217 m Patient Age:    88 years            BP:           143/85 mmHg Patient Gender: M                   HR:           91 bpm. Exam Location:  Inpatient Procedure: 2D Echo, Cardiac Doppler and Color Doppler (Both Spectral and Color            Flow Doppler were utilized during procedure). Indications:    Pericardial Effusion  History:        Patient has prior history of Echocardiogram examinations.                 Pericardial Disease; Risk Factors:Hypertension.  Sonographer:    Vella Key Referring Phys: [id number] PETER C NISHAN IMPRESSIONS  1. Left ventricular ejection fraction, by estimation, is 20 to 25%. The left ventricle has severely decreased function. The left ventricle demonstrates global hypokinesis. There is moderate concentric left ventricular hypertrophy. Left ventricular diastolic parameters are indeterminate.  2. Right ventricular systolic function is normal. The right ventricular size is normal.  3. Left atrial size was mildly dilated.  4. There is a small circumfrential pericardial effusion with a moderate focal collection abutting the RA. There is mild invagination of the RA but no overt tamponade. a small pericardial effusion is present. The pericardial effusion is circumferential and localized near the right atrium. There is no evidence of cardiac tamponade.  5. The mitral valve is normal in structure. Trivial mitral valve regurgitation. No evidence of mitral stenosis.  6.  The aortic valve is tricuspid. There is mild calcification of the aortic valve. Aortic valve regurgitation is mild. Aortic valve sclerosis/calcification is present, without any evidence of aortic stenosis.  7. Aortic dilatation noted. There is borderline dilatation of the aortic root, measuring 38 mm. There is  borderline dilatation of the ascending aorta, measuring 38 mm.  8. The inferior vena cava is dilated in size with >50% respiratory variability, suggesting right atrial pressure of 8 mmHg. FINDINGS  Left Ventricle: Left ventricular ejection fraction, by estimation, is 20 to 25%. The left ventricle has severely decreased function. The left ventricle demonstrates global hypokinesis. The left ventricular internal cavity size was normal in size. There is moderate concentric left ventricular hypertrophy. Left ventricular diastolic parameters are indeterminate. Right Ventricle: The right ventricular size is normal. No increase in right ventricular wall thickness. Right ventricular systolic function is normal. Left Atrium: Left atrial size was mildly dilated. Right Atrium: Right atrial size was normal in size. Pericardium: There is a small circumfrential pericardial effusion with a moderate focal collection abutting the RA. There is mild invagination of the RA but no overt tamponade. A small pericardial effusion is present. The pericardial effusion is circumferential and localized near the right atrium. There is no evidence of cardiac tamponade. Mitral Valve: The mitral valve is normal in structure. Trivial mitral valve regurgitation. No evidence of mitral valve stenosis. Tricuspid Valve: The tricuspid valve is normal in structure. Tricuspid valve regurgitation is trivial. No evidence of tricuspid stenosis. Aortic Valve: The aortic valve is tricuspid. There is mild calcification of the aortic valve. Aortic valve regurgitation is mild. Aortic valve sclerosis/calcification is present, without any evidence of aortic stenosis. Pulmonic Valve: The pulmonic valve was normal in structure. Pulmonic valve regurgitation is trivial. No evidence of pulmonic stenosis. Aorta: Aortic dilatation noted. There is borderline dilatation of the aortic root, measuring 38 mm. There is borderline dilatation of the ascending aorta, measuring 38 mm.  Venous: The inferior vena cava is dilated in size with greater than 50% respiratory variability, suggesting right atrial pressure of 8 mmHg. IAS/Shunts: No atrial level shunt detected by color flow Doppler.  LEFT VENTRICLE PLAX 2D LVIDd:         4.70 cm      Diastology LVIDs:         4.10 cm      LV e' medial:    4.29 cm/s LV PW:         1.40 cm      LV E/e' medial:  27.0 LV IVS:        1.40 cm      LV e' lateral:   5.08 cm/s LVOT diam:     2.10 cm      LV E/e' lateral: 22.8 LV SV:         70 LV SV Index:   32 LVOT Area:     3.46 cm  LV Volumes (MOD) LV vol d, MOD A2C: 127.0 ml LV vol d, MOD A4C: 134.0 ml LV vol s, MOD A2C: 92.8 ml LV vol s, MOD A4C: 88.9 ml LV SV MOD A2C:     34.2 ml LV SV MOD A4C:     134.0 ml LV SV MOD BP:      41.2 ml RIGHT VENTRICLE RV Basal diam:  3.40 cm RV S prime:     13.10 cm/s TAPSE (M-mode): 3.1 cm LEFT ATRIUM             Index  RIGHT ATRIUM           Index LA diam:        3.40 cm 1.53 cm/m   RA Area:     18.00 cm LA Vol (A2C):   76.0 ml 34.28 ml/m  RA Volume:   47.90 ml  21.61 ml/m LA Vol (A4C):   52.0 ml 23.46 ml/m LA Biplane Vol: 70.1 ml 31.62 ml/m  AORTIC VALVE LVOT Vmax:   110.00 cm/s LVOT Vmean:  76.400 cm/s LVOT VTI:    0.203 m  AORTA Ao Root diam: 3.80 cm Ao Asc diam:  3.80 cm MITRAL VALVE MV Area (PHT): 8.43 cm     SHUNTS MV Decel Time: 90 msec      Systemic VTI:  0.20 m MV E velocity: 116.00 cm/s  Systemic Diam: 2.10 cm MV A velocity: 60.00 cm/s MV E/A ratio:  1.93 Toribio Fuel MD Electronically signed by Toribio Fuel MD Signature Date/Time: 05/06/2024/12:35:30 PM    Final    CT Chest W Contrast Addendum Date: 05/06/2024 ADDENDUM REPORT: 05/06/2024 10:11 ADDENDUM: The original report was by Dr. Ryan Salvage. The following addendum is by Dr. Ryan Salvage: These results were called by telephone at the time of interpretation on 05/06/2024 at 10:02 am to provider [facility] , who verbally acknowledged these results. Electronically Signed   By:  Ryan Salvage M.D.   On: 05/06/2024 10:11   Result Date: 05/06/2024 CLINICAL DATA:  Left upper lobe non-small cell lung cancer, hypoxia and shortness of breath. * Tracking Code: BO * EXAM: CT CHEST WITH CONTRAST TECHNIQUE: Multidetector CT imaging of the chest was performed during intravenous contrast administration. RADIATION DOSE REDUCTION: This exam was performed according to the departmental dose-optimization program which includes automated exposure control, adjustment of the mA and/or kV according to patient size and/or use of iterative reconstruction technique. CONTRAST:  75mL OMNIPAQUE  IOHEXOL  300 MG/ML  SOLN COMPARISON:  01/16/2024 FINDINGS: Cardiovascular: Coronary, aortic arch, and branch vessel atherosclerotic vascular disease. Large and substantially increased pericardial effusion. Mediastinum/Nodes: Borderline prominence of right infrahilar nodal tissue. Lungs/Pleura: Moderate left and small right pleural effusions with passive atelectasis. Centrilobular emphysema. Hazy airspace opacity in the aerated portion of the left lower lobe for example on image 79 series 6, suspicious for pneumonia or pneumonitis. Residuum of the left upper lobe mass measures proximally 4.0 by 1.5 cm, previously 4.5 by 1.9 cm on 01/16/2024. Airway plugging of the apicoposterior segment left upper lobe. Right upper lobe subpleural nodule 0.6 cm in diameter on image 82 series 6, stable. Upper Abdomen: Left adrenal nodule 2.1 by 1.7 cm, stable by my measurements, indeterminate for malignancy versus benign etiology. Musculoskeletal: Unremarkable IMPRESSION: 1. Large and substantially increased pericardial effusion compared to 01/16/2024. Correlate with physical exam indicators of tamponade in deciding whether echocardiographic workup is indicated. 2. Moderate left and small right pleural effusions with passive atelectasis. 3. Hazy airspace opacity in the aerated portion of the left lower lobe suspicious for pneumonia or  pneumonitis. 4. Residuum of the left upper lobe mass measures 4.0 by 1.5 cm, previously 4.5 by 1.9 cm on 01/16/2024. 5. Airway plugging of the apicoposterior segment left upper lobe. 6. Stable 0.6 cm right upper lobe subpleural nodule. 7. Stable left adrenal nodule, indeterminate for malignancy versus benign etiology (although chronicity dating back to 05/03/2010 tends to favor benign etiology). 8. Aortic Atherosclerosis (ICD10-I70.0) and Emphysema (ICD10-J43.9). Radiology assistant personnel have been notified to put me in telephone contact with the referring physician or the referring  physician's clinical representative in order to discuss these findings. Once this communication is established I will issue an addendum to this report for documentation purposes. Electronically Signed: By: Ryan Salvage M.D. On: 05/06/2024 09:52   DG Chest Port 1 View Result Date: 05/06/2024 CLINICAL DATA:  Shortness of breath. History of non-small-cell lung cancer. EXAM: PORTABLE CHEST 1 VIEW COMPARISON:  02/26/2024 FINDINGS: The cardio pericardial silhouette is enlarged. Interstitial markings are diffusely coarsened with chronic features. Hazy left parahilar opacity is similar to prior. The lungs are clear without focal pneumonia, edema, pneumothorax or pleural effusion. No acute bony abnormality. Telemetry leads overlie the chest. IMPRESSION: Stable. Chronic interstitial coarsening without new or acute cardiopulmonary findings. Similar appearance hazy left parahilar opacity, region of patient's known lesion. Electronically Signed   By: Camellia Candle M.D.   On: 05/06/2024 07:26     ASSESSMENT/PLAN:  This is a very pleasant 88 year old African-American male recently diagnosed with Stage IIIa (T3, N2, M0) non-small cell lung cancer, squamous cell carcinoma presented with left upper lobe lung mass abutting the pleura and ipsilateral mediastinal and left hilar lymph nodes. The left adrenal nodule is mildly FDG avid and  indeterminate. Diagnosed in January 2025.    The patient completed a course of concurrent chemoradiation with weekly carboplatin  for AUC of 2 and paclitaxel  45 Mg/M2 status post 7 cycles.  Last dose was given 12/23/2023.    He is currently undergoing a consolidation immunotherapy with Imfinzi  1500 mg IV every 4 weeks. He is status post 3 cycles.    Labs were reviewed. Discussed the cough with Dr. Sherrod. ***CHF discontinue????  Labs were reviewed. Recommend ***    We will see him back for labs and follow up in 4 weeks before undergoing cycle #5.   The patient was advised to call immediately if he has any concerning symptoms in the interval. The patient voices understanding of current disease status and treatment options and is in agreement with the current care plan. All questions were answered. The patient knows to call the clinic with any problems, questions or concerns. We can certainly see the patient much sooner if necessary    No orders of the defined types were placed in this encounter.    I spent {CHL ONC TIME VISIT - DTPQU:[id number]} counseling the patient face to face. The total time spent in the appointment was {CHL ONC TIME VISIT - DTPQU:[id number]}.  Luticia Tadros L Kevia Zaucha, PA-C 05/14/24

## 2024-05-20 ENCOUNTER — Other Ambulatory Visit: Payer: Self-pay

## 2024-05-20 NOTE — Transitions of Care (Post Inpatient/ED Visit) (Signed)
 Transition of Care week 2  Visit Note  05/20/2024  Name: Jonathan [name]. MRN: [id number]          DOB: 1935/12/10  Situation: Patient enrolled in [facility] 30-day program. Visit completed with Lynwood Nelwyn by telephone.   Background:    Past Medical History:  Diagnosis Date   CAD (coronary artery disease)    Two RCA stents remotely / 3rd RCA stent 2006   Colon polyps    s/p several Cscopes.   COPD (chronic obstructive pulmonary disease) (HCC)    on O2, nocturnal   Diabetes mellitus    dx aprox 2009   ED (erectile dysfunction)    has a vacumm device   Ejection fraction    Hyperlipidemia    dx in 90s   Hypertension    dx in the 46   Hypogonadism male    PVD (peripheral vascular disease) (HCC)    s/p stents at [facility] 2009, Dr Ladona   Shortness of breath    O2 Sat dropped to 82% walking on the treadmill, September, 2012    Assessment: Patient Reported Symptoms: Cognitive Cognitive Status: Alert and oriented to person, place, and time      Neurological Neurological Review of Symptoms: Numbness (Tingling to feet due to Neuropathy) Neurological Management Strategies: Medication therapy  HEENT HEENT Symptoms Reported: No symptoms reported      Cardiovascular Cardiovascular Symptoms Reported: No symptoms reported Cardiovascular Management Strategies: Medication therapy, Adequate rest, Weight management Do You Have a Working Readable Scale?: Yes Weight: 208 lb (94.3 kg) Cardiovascular Comment: The patient's weight is highter than last recorded weight in Epic. The patient states his current weight is consistent each day. No edema to LE  Respiratory Respiratory Symptoms Reported: No symptoms reported    Endocrine Endocrine Symptoms Reported: No symptoms reported Is patient diabetic?: Yes Is patient checking blood sugars at home?: Yes List most recent blood sugar readings, include date and time of day: 120 05/19/24    Gastrointestinal Gastrointestinal Symptoms Reported: No  symptoms reported      Genitourinary Genitourinary Symptoms Reported: No symptoms reported    Integumentary Integumentary Symptoms Reported: No symptoms reported    Musculoskeletal Musculoskelatal Symptoms Reviewed: No symptoms reported        Psychosocial Psychosocial Symptoms Reported: No symptoms reported         There were no vitals filed for this visit.  Medications Reviewed Today     Reviewed by Moises Reusing, RN (Case Manager) on 05/20/24 at 1111  Med List Status: <None>   Medication Order Taking? Sig Documenting Provider Last Dose Status Informant  albuterol  (VENTOLIN  HFA) 108 (90 Base) MCG/ACT inhaler [id number]  Inhale 2 puffs into the lungs every 6 (six) hours as needed for wheezing or shortness of breath. Neysa Reggy BIRCH, MD  Active Self, Other, Pharmacy Records, Multiple Informants  atorvastatin  (LIPITOR) 40 MG tablet [id number]  Take 1 tablet (40 mg total) by mouth daily. Tobie Yetta HERO, MD  Active   benzonatate  (TESSALON ) 200 MG capsule [id number]  Take 1 capsule (200 mg total) by mouth 3 (three) times daily as needed for cough. Neysa Reggy BIRCH, MD  Active Self, Other, Pharmacy Records, Multiple Informants  Blood Glucose Monitoring Suppl (FREESTYLE FREEDOM LITE) w/Device KIT [id number]  Use Freestyle Freedom Lite meter to check blood sugar twice daily. DX:E11.65 [place], MD  Active Self, Pharmacy Records, Other, Multiple Informants  clopidogrel  (PLAVIX ) 75 MG tablet [id number]  Take 1 tablet (75 mg total) by mouth daily  with breakfast. Tobie Yetta HERO, MD  Active   empagliflozin  (JARDIANCE ) 25 MG TABS tablet [id number]  Take 1 tablet (25 mg total) by mouth daily before breakfast. For diabetes E11.65 and E 11.29 Thapa, [place], MD  Active Self, Pharmacy Records, Other, Multiple Informants  furosemide  (LASIX ) 40 MG tablet [id number]  Take 1 tablet (40 mg total) by mouth daily. Tobie Yetta HERO, MD  Active   gabapentin  (NEURONTIN ) 300 MG capsule [id number]  Take 300 mg by  mouth 2 (two) times daily. [provider]  Active Self, Other, Pharmacy Records, Multiple Informants  glucose blood (FREESTYLE LITE) test strip [id number]  USE AS INSTRUCTED Von [place], MD  Active Self, Pharmacy Records, Other, Multiple Informants  hydrOXYzine  (ATARAX ) 25 MG tablet [id number]  Take 1 tablet by mouth at bedtime as needed for anxiety. [provider]  Active Self, Pharmacy Records, Other, Multiple Informants  metFORMIN  (GLUCOPHAGE ) 500 MG tablet [id number]  Take 1 tablet (500 mg total) by mouth 2 (two) times daily with a meal.  Patient taking differently: Take 500-1,000 mg by mouth 2 (two) times daily with a meal. Take one tablet in the morning and take two tablets in the evening   Thapa, [place], MD  Active Self, Pharmacy Records, Other, Multiple Informants  metoprolol  succinate (TOPROL -XL) 100 MG 24 hr tablet [id number] Yes Take 1 tablet (100 mg total) by mouth in the morning and at bedtime. Take with or immediately following a meal.  Patient taking differently: Take 100 mg by mouth daily. The patient is taking it daily   Tobie Yetta HERO, MD  Active   Multiple Vitamin (MULTIVITAMIN WITH MINERALS) TABS tablet [id number]  Take 1 tablet by mouth daily. [provider]  Active Self, Pharmacy Records, Other, Multiple Informants  OXYGEN  [id number]  Inhale 2 L/min into the lungs continuous. [provider]  Active Self, Pharmacy Records, Other, Multiple Informants           Med Note (CANTER, LILA D   Wed Apr 29, 2024 12:56 PM)    sacubitril -valsartan  (ENTRESTO ) 24-26 MG [id number]  Take 1 tablet by mouth 2 (two) times daily. Fairy Frames, MD  Active Self, Pharmacy Records, Other, Multiple Informants  spironolactone  (ALDACTONE ) 25 MG tablet [id number]  Take 0.5 tablets (12.5 mg total) by mouth daily. Cindy Garnette POUR, MD  Active   tamsulosin  (FLOMAX ) 0.4 MG CAPS capsule [id number]  Take 1 capsule (0.4 mg total) by mouth daily after supper. Paz, Jose E, MD   Active Self, Pharmacy Records, Other, Multiple Informants  Tiotropium Bromide -Olodaterol (STIOLTO RESPIMAT ) 2.5-2.5 MCG/ACT AERS [id number]  Inhale 2 puffs into the lungs daily. Neysa Reggy BIRCH, MD  Active Self, Other, Pharmacy Records, Multiple Informants  Med List Note Steffi Nian, CPhT 05/06/24 1403): [facility] [employer]             Recommendation:   Continue Current Plan of Care  Follow Up Plan:   Telephone follow-up in 1 week  [employer], BSN, RN [employer] [facility]  VBCI - [employer] [facility] RN Care Manager [phone]

## 2024-05-20 NOTE — Patient Instructions (Signed)
 Visit Information  Thank you for taking time to visit with me today. Please don't hesitate to contact me if I can be of assistance to you before our next scheduled telephone appointment.  Our next appointment is by telephone on Wednesday September 10th at 11:00am  Following is a copy of your care plan:   Goals Addressed             This Visit's Progress    VBCI Transitions of Care (TOC) Care Plan       Problems: (reviewed 05/20/24) Recent Hospitalization for treatment of CHF Knowledge Deficit Related to Congestive Heart Failure and Medication management barrier Patient is getting medications [facility] and PCP, cardiologist and gets confused with which ones to take.  Goal: (reviewed 05/20/24) Over the next 30 days, the patient will not experience hospital readmission  Interventions: (reviewed 05/20/24)  Heart Failure Interventions: Provided education on low sodium diet Assessed need for readable accurate scales in home Provided education about placing scale on hard, flat surface Advised patient to weigh each morning after emptying bladder Discussed importance of daily weight and advised patient to weigh and record daily Reviewed role of diuretics in prevention of fluid overload and management of heart failure; Discussed the importance of keeping all appointments with provider  Patient Self Care Activities: (reviewed 05/20/24) Attend all scheduled provider appointments Call pharmacy for medication refills 3-7 days in advance of running out of medications Call provider office for new concerns or questions  Notify RN Care Manager of TOC call rescheduling needs Participate in Transition of Care Program/Attend TOC scheduled calls Perform all self care activities independently  Take medications as prescribed   use salt in moderation watch for swelling in feet, ankles and legs every day track symptoms and what helps feel better or worse  Plan:  An initial telephone outreach has  been scheduled for: [phone]  Next PCP appointment scheduled for: Patient will call for appointment - 05/20/24 reviewed with the patient who will call and scheduled an appointment and include flu shot Telephone follow up appointment with care management team member scheduled for: Wednesday September 10th at 11:00am        Patient verbalizes understanding of instructions and care plan provided today and agrees to view in MyChart. Active MyChart status and patient understanding of how to access instructions and care plan via MyChart confirmed with patient.     The patient has been provided with contact information for the care management team and has been advised to call with any health related questions or concerns.   Please call the care guide team at [phone] if you need to cancel or reschedule your appointment.   Please call the Suicide and Crisis Lifeline: 988 call the [place]  National Suicide Prevention Lifeline: (832)426-5897 or TTY: 405-273-7957 TTY 323 831 3400) to talk to a trained counselor if you are experiencing a Mental Health or Behavioral Health Crisis or need someone to talk to.  Medford Balboa, BSN, RN [place]  VBCI - [employer] [facility] RN Care Manager [phone]

## 2024-05-21 ENCOUNTER — Inpatient Hospital Stay (HOSPITAL_BASED_OUTPATIENT_CLINIC_OR_DEPARTMENT_OTHER): Admitting: Physician Assistant

## 2024-05-21 ENCOUNTER — Inpatient Hospital Stay: Attending: Internal Medicine

## 2024-05-21 ENCOUNTER — Inpatient Hospital Stay

## 2024-05-21 VITALS — BP 140/82 | HR 65 | Temp 97.9°F | Resp 17 | Ht 74.0 in | Wt 208.2 lb

## 2024-05-21 DIAGNOSIS — I2510 Atherosclerotic heart disease of native coronary artery without angina pectoris: Secondary | ICD-10-CM | POA: Insufficient documentation

## 2024-05-21 DIAGNOSIS — I082 Rheumatic disorders of both aortic and tricuspid valves: Secondary | ICD-10-CM | POA: Diagnosis not present

## 2024-05-21 DIAGNOSIS — Z7902 Long term (current) use of antithrombotics/antiplatelets: Secondary | ICD-10-CM | POA: Insufficient documentation

## 2024-05-21 DIAGNOSIS — Z923 Personal history of irradiation: Secondary | ICD-10-CM | POA: Diagnosis not present

## 2024-05-21 DIAGNOSIS — E785 Hyperlipidemia, unspecified: Secondary | ICD-10-CM | POA: Diagnosis not present

## 2024-05-21 DIAGNOSIS — J9811 Atelectasis: Secondary | ICD-10-CM | POA: Insufficient documentation

## 2024-05-21 DIAGNOSIS — E278 Other specified disorders of adrenal gland: Secondary | ICD-10-CM | POA: Diagnosis not present

## 2024-05-21 DIAGNOSIS — J449 Chronic obstructive pulmonary disease, unspecified: Secondary | ICD-10-CM | POA: Insufficient documentation

## 2024-05-21 DIAGNOSIS — I110 Hypertensive heart disease with heart failure: Secondary | ICD-10-CM | POA: Insufficient documentation

## 2024-05-21 DIAGNOSIS — N529 Male erectile dysfunction, unspecified: Secondary | ICD-10-CM | POA: Diagnosis not present

## 2024-05-21 DIAGNOSIS — I77810 Thoracic aortic ectasia: Secondary | ICD-10-CM | POA: Diagnosis not present

## 2024-05-21 DIAGNOSIS — Z9221 Personal history of antineoplastic chemotherapy: Secondary | ICD-10-CM | POA: Diagnosis not present

## 2024-05-21 DIAGNOSIS — I3139 Other pericardial effusion (noninflammatory): Secondary | ICD-10-CM | POA: Insufficient documentation

## 2024-05-21 DIAGNOSIS — I5043 Acute on chronic combined systolic (congestive) and diastolic (congestive) heart failure: Secondary | ICD-10-CM | POA: Insufficient documentation

## 2024-05-21 DIAGNOSIS — Z5112 Encounter for antineoplastic immunotherapy: Secondary | ICD-10-CM | POA: Insufficient documentation

## 2024-05-21 DIAGNOSIS — E119 Type 2 diabetes mellitus without complications: Secondary | ICD-10-CM | POA: Diagnosis not present

## 2024-05-21 DIAGNOSIS — C792 Secondary malignant neoplasm of skin: Secondary | ICD-10-CM | POA: Insufficient documentation

## 2024-05-21 DIAGNOSIS — C3412 Malignant neoplasm of upper lobe, left bronchus or lung: Secondary | ICD-10-CM

## 2024-05-21 DIAGNOSIS — Z7984 Long term (current) use of oral hypoglycemic drugs: Secondary | ICD-10-CM | POA: Diagnosis not present

## 2024-05-21 DIAGNOSIS — J432 Centrilobular emphysema: Secondary | ICD-10-CM | POA: Diagnosis not present

## 2024-05-21 DIAGNOSIS — I700 Atherosclerosis of aorta: Secondary | ICD-10-CM | POA: Insufficient documentation

## 2024-05-21 DIAGNOSIS — Z79899 Other long term (current) drug therapy: Secondary | ICD-10-CM | POA: Insufficient documentation

## 2024-05-21 DIAGNOSIS — I739 Peripheral vascular disease, unspecified: Secondary | ICD-10-CM | POA: Diagnosis not present

## 2024-05-21 LAB — CMP (CANCER CENTER ONLY)
ALT: 10 U/L (ref 0–44)
AST: 12 U/L — ABNORMAL LOW (ref 15–41)
Albumin: 3.7 g/dL (ref 3.5–5.0)
Alkaline Phosphatase: 69 U/L (ref 38–126)
Anion gap: 5 (ref 5–15)
BUN: 32 mg/dL — ABNORMAL HIGH (ref 8–23)
CO2: 32 mmol/L (ref 22–32)
Calcium: 9.6 mg/dL (ref 8.9–10.3)
Chloride: 105 mmol/L (ref 98–111)
Creatinine: 1.39 mg/dL — ABNORMAL HIGH (ref 0.61–1.24)
GFR, Estimated: 49 mL/min — ABNORMAL LOW (ref >60)
Glucose, Bld: 105 mg/dL — ABNORMAL HIGH (ref 70–99)
Potassium: 5.1 mmol/L (ref 3.5–5.1)
Sodium: 142 mmol/L (ref 135–145)
Total Bilirubin: 0.5 mg/dL (ref 0.0–1.2)
Total Protein: 7.6 g/dL (ref 6.5–8.1)

## 2024-05-21 LAB — CBC WITH DIFFERENTIAL (CANCER CENTER ONLY)
Abs Immature Granulocytes: 0.02 K/uL (ref 0.00–0.07)
Basophils Absolute: 0.1 K/uL (ref 0.0–0.1)
Basophils Relative: 1 %
Eosinophils Absolute: 0.2 K/uL (ref 0.0–0.5)
Eosinophils Relative: 2 %
HCT: 48.2 % (ref 39.0–52.0)
Hemoglobin: 16.1 g/dL (ref 13.0–17.0)
Immature Granulocytes: 0 %
Lymphocytes Relative: 17 %
Lymphs Abs: 1.5 K/uL (ref 0.7–4.0)
MCH: 32.4 pg (ref 26.0–34.0)
MCHC: 33.4 g/dL (ref 30.0–36.0)
MCV: 97 fL (ref 80.0–100.0)
Monocytes Absolute: 0.9 K/uL (ref 0.1–1.0)
Monocytes Relative: 10 %
Neutro Abs: 6.5 K/uL (ref 1.7–7.7)
Neutrophils Relative %: 70 %
Platelet Count: 192 K/uL (ref 150–400)
RBC: 4.97 MIL/uL (ref 4.22–5.81)
RDW: 15 % (ref 11.5–15.5)
WBC Count: 9.2 K/uL (ref 4.0–10.5)
nRBC: 0 % (ref 0.0–0.2)

## 2024-05-21 NOTE — Progress Notes (Signed)
 Cardiology Office Note    Date:  05/23/2024  ID:  Jonathan [name] Jonathan [name]., DOB Oct 02, 1935, MRN [id number] PCP:  Jonathan Aloysius FORBES, MD  Cardiologist:  Jonathan Lesches, MD  Electrophysiologist:  None   Chief Complaint: Follow up for HFrEF   History of Present Illness: .    Jonathan [name]. is a 88 y.o. male with visit-pertinent history of CAD, PVD, prior smoking history, severe COPD, hypertension, hyperlipidemia, and DM. He has a prior left SFA stent. He has chronic polycythemia from hypoxia.   Patient with known history of CAD, patient with RCA stent in 2006, and 12/2021 had DES to RCA, DES to LAD, residual LCx disease treated medically.  Of note symptoms were increased shortness of breath, denied any chest pain.  Patient also with history of chronic systolic and diastolic heart failure.  Echo in 12/2021 with preserved EF, moderate TR, echo in 02/2023 with LVEF 40 to 45%, G1 DD, moderate LAE.  Patient was admitted in 02/21/2023 with acute dyspnea found to have new LVEF 40 to 45%, G1 DD.  Patient wished to discharge after an overnight stay, discharged on p.o. Lasix , Entresto , metoprolol  and Jardiance .  At follow-up patient noted significant concerns given ongoing shortness of breath, noted that this was his anginal equivalent.  On 03/13/2023 patient underwent left heart catheterization, noted to have nonobstructive CAD with continued patency of stents in the LAD and the RCA.  On his follow-up visit it was noted the patient had history of COPD and was wearing oxygen  at night and some during the day when he was short of breath.  It was noted that he had inhalers but had not been using them regularly.  Patient was then lost to follow-up.  Echocardiogram on 09/20/2023 indicated LVEF 55 to 60%, no RWMA, mild concentric LVH, G1 DD, RV systolic function and size was normal, mild mitral valve regurgitation, aortic valve regurgitation was mild, aortic valve sclerosis was evident without evidence of stenosis.  In January 2025  patient was diagnosed with stage IIIa non-small cell lung cancer, squamous cell carcinoma presented with left upper lobe lung mass abutting the pleura and ipsilateral mediastinal and left hilar lymph nodes.  On chart review patient has been on concurrent chemoradiation, was also on immunotherapy with Imfinzi , discontinued due to worsening CHF of unclear etiology.  On 05/06/2024 patient presented to the emergency department with hypoxia and worsening shortness of breath.  CT completed in the ER during admission was read as having a large pericardial effusion however on review by Dr. Leontine was felt to be moderate, not circumstantial and mostly over the RV.  It was noted that patient had increased lower extremity edema but no leg pain.  Echocardiogram on 05/06/2024 indicated LVEF 20 to 25%, moderate concentric LVH, diastolic parameters were indeterminate, RV systolic function and size was normal, there was a small circumferential pericardial effusion with a moderate focal collection abutting the RA, there was mild invagination of the RA but no overt tamponade.  Patient's medications were adjusted to include Jardiance , Toprol , Entresto  and low-dose Aldactone , Lasix  20 mg daily.  It was recommended that patient no longer have anthracycline based chemo.  Patient was discharged in stable condition on 05/08/2024.  On 05/09/2024 patient presented to [name], ED with significant shortness of breath.  Patient reported he had not taken his medications the day prior after being released from the hospital, also endorsed noncompliance with low-salt diet since discharge.  It was noted that aside from his 1  episode of PND he reported feeling very well.  Patient was seen by Dr. Kennyth and started on IV diuresis.  Today he presents for follow up, he reports that he is doing well. He denies chest pain and feels his breathing has been stable. He notes he continues assisting caring for his wife, is considering moving closer to family  but is unsure of this at this time. He denies lower extremity edema, orthopnea or pnd.  He denies any palpitations, presyncope or syncope.  Patient reports that he is actually feeling very well overall.  Patient reports that he has been trying to be adherent with low-salt diet.  On review of patient's medications, he seems unsure as to which medications he has been consistently taking, notes that he received some medications from the [facility].  He reports that he has not been consistently taking Entresto  until recent weeks, does not believe that he was on Entresto  prior to recent hospitalization however it is unclear where he is receiving the medication from.  Labwork independently reviewed: 05/21/24: Sodium 142, potassium 5.1, creatinine 1.39, AST 12, ALT 10, hemoglobin 16.1, hematocrit 48.2 ROS: .   Today he denies chest pain, lower extremity edema, fatigue, palpitations, melena, hematuria, hemoptysis, diaphoresis, weakness, presyncope, syncope, orthopnea, and PND.  All other systems are reviewed and otherwise negative. Studies Reviewed: SABRA   EKG:  EKG is ordered today, personally reviewed, demonstrating  EKG Interpretation Date/Time:  Friday May 22 2024 08:26:49 EDT Ventricular Rate:  56 PR Interval:  238 QRS Duration:  86 QT Interval:  466 QTC Calculation: 449 R Axis:   119  Text Interpretation: Sinus bradycardia with 1st degree A-V block with Premature supraventricular complexes Lateral infarct , age undetermined No significant change since last tracing Confirmed by Jonathan [name] [phone]) on 05/22/2024 9:14:46 AM   CV Studies: Cardiac studies reviewed are outlined and summarized above. Otherwise please see EMR for full report. Cardiac Studies & Procedures   ______________________________________________________________________________________________ CARDIAC CATHETERIZATION  CARDIAC CATHETERIZATION 03/13/2023  Conclusion   Ost Cx lesion is 60% stenosed.   Mid Cx to Dist Cx lesion is 40%  stenosed.   Dist LAD lesion is 40% stenosed.   Non-stenotic Prox LAD lesion was previously treated.   Non-stenotic Prox RCA lesion was previously treated.   LV end diastolic pressure is mildly elevated.  Nonobstructive CAD. Continued patency of stents in the LAD and RCA Mildly elevated LVEDP 22 mm Hg   Plan: medical management.  Findings Coronary Findings Diagnostic  Dominance: Right  Left Anterior Descending Non-stenotic Prox LAD lesion was previously treated. Dist LAD lesion is 40% stenosed.  Left Circumflex Ost Cx lesion is 60% stenosed. Mid Cx to Dist Cx lesion is 40% stenosed.  First Obtuse Marginal Branch Vessel is small in size.  Second Obtuse Marginal Branch Vessel is small in size.  Third Obtuse Marginal Branch Vessel is large in size.  Right Coronary Artery Non-stenotic Prox RCA lesion was previously treated.  Intervention  No interventions have been documented.   CARDIAC CATHETERIZATION  CARDIAC CATHETERIZATION 01/12/2022  Conclusion   Prox LAD lesion is 90% stenosed.   A drug-eluting stent was successfully placed using a SYNERGY XD 4.0X20.   Post intervention, there is a 0% residual stenosis.   Prox RCA lesion is 75% stenosed.   A drug-eluting stent was successfully placed using a SYNERGY XD 3.0X16.   Post intervention, there is a 0% residual stenosis.  Successful PCI of the mid LAD with DES x 1 Successful PCI of the  proximal RCA with DES x 1  Plan: DAPT with ASA and Plavix  for one year. Anticipate DC in am if no complications.  Findings Coronary Findings Diagnostic  Dominance: Right  Left Anterior Descending Prox LAD lesion is 90% stenosed.  Left Circumflex Ost Cx lesion is 70% stenosed. Mid Cx to Dist Cx lesion is 70% stenosed.  First Obtuse Marginal Branch Vessel is small in size.  Second Obtuse Marginal Branch Vessel is small in size.  Third Obtuse Marginal Branch Vessel is large in size.  Right Coronary Artery Prox RCA  lesion is 75% stenosed.  Intervention  Prox LAD lesion Stent CATH VISTA GUIDE 6FR XBLAD4 guide catheter was inserted. Lesion crossed with guidewire using a WIRE HI TORQ WHISPER MS 190CM. Pre-stent angioplasty was performed using a BALLN SAPPHIRE 2.5X15. A drug-eluting stent was successfully placed using a SYNERGY XD 4.0X20. Maximum pressure: 14 atm. Stent strut is well apposed. Post-stent angioplasty was not performed. Post-Intervention Lesion Assessment The intervention was successful. Pre-interventional TIMI flow is 3. Post-intervention TIMI flow is 3. No complications occurred at this lesion. There is a 0% residual stenosis post intervention.  Prox RCA lesion Stent CATHETER LAUNCHER 6FR AL1 guide catheter was inserted. Lesion crossed with guidewire using a WIRE ASAHI PROWATER 180CM. Pre-stent angioplasty was performed using a BALLN SAPPHIRE 2.5X15. A drug-eluting stent was successfully placed using a SYNERGY XD 3.0X16. Stent strut is well apposed. Post-stent angioplasty was performed using a BALL SAPPHIRE NC24 3.5X12. Maximum pressure:  16 atm. Post-Intervention Lesion Assessment The intervention was successful. Pre-interventional TIMI flow is 3. Post-intervention TIMI flow is 3. No complications occurred at this lesion. There is a 0% residual stenosis post intervention.   STRESS TESTS  MYOCARDIAL PERFUSION IMAGING 04/24/2005   ECHOCARDIOGRAM  ECHOCARDIOGRAM COMPLETE 05/06/2024  Narrative ECHOCARDIOGRAM REPORT    Patient Name:   Jonathan [name]. Date of Exam: 05/06/2024 Medical Rec #:  [id number]           Height:       74.0 in Accession #:    [id number]          Weight:       209.5 lb Date of Birth:  May 05, 1936            BSA:          2.217 m Patient Age:    88 years            BP:           143/85 mmHg Patient Gender: M                   HR:           91 bpm. Exam Location:  Inpatient  Procedure: 2D Echo, Cardiac Doppler and Color Doppler (Both Spectral and Color Flow  Doppler were utilized during procedure).  Indications:    Pericardial Effusion  History:        Patient has prior history of Echocardiogram examinations. Pericardial Disease; Risk Factors:Hypertension.  Sonographer:    Vella Key Referring Phys: [id number] PETER C NISHAN  IMPRESSIONS   1. Left ventricular ejection fraction, by estimation, is 20 to 25%. The left ventricle has severely decreased function. The left ventricle demonstrates global hypokinesis. There is moderate concentric left ventricular hypertrophy. Left ventricular diastolic parameters are indeterminate. 2. Right ventricular systolic function is normal. The right ventricular size is normal. 3. Left atrial size was mildly dilated. 4. There is a small circumfrential pericardial effusion with a moderate focal  collection abutting the RA. There is mild invagination of the RA but no overt tamponade. a small pericardial effusion is present. The pericardial effusion is circumferential and localized near the right atrium. There is no evidence of cardiac tamponade. 5. The mitral valve is normal in structure. Trivial mitral valve regurgitation. No evidence of mitral stenosis. 6. The aortic valve is tricuspid. There is mild calcification of the aortic valve. Aortic valve regurgitation is mild. Aortic valve sclerosis/calcification is present, without any evidence of aortic stenosis. 7. Aortic dilatation noted. There is borderline dilatation of the aortic root, measuring 38 mm. There is borderline dilatation of the ascending aorta, measuring 38 mm. 8. The inferior vena cava is dilated in size with >50% respiratory variability, suggesting right atrial pressure of 8 mmHg.  FINDINGS Left Ventricle: Left ventricular ejection fraction, by estimation, is 20 to 25%. The left ventricle has severely decreased function. The left ventricle demonstrates global hypokinesis. The left ventricular internal cavity size was normal in size. There is moderate  concentric left ventricular hypertrophy. Left ventricular diastolic parameters are indeterminate.  Right Ventricle: The right ventricular size is normal. No increase in right ventricular wall thickness. Right ventricular systolic function is normal.  Left Atrium: Left atrial size was mildly dilated.  Right Atrium: Right atrial size was normal in size.  Pericardium: There is a small circumfrential pericardial effusion with a moderate focal collection abutting the RA. There is mild invagination of the RA but no overt tamponade. A small pericardial effusion is present. The pericardial effusion is circumferential and localized near the right atrium. There is no evidence of cardiac tamponade.  Mitral Valve: The mitral valve is normal in structure. Trivial mitral valve regurgitation. No evidence of mitral valve stenosis.  Tricuspid Valve: The tricuspid valve is normal in structure. Tricuspid valve regurgitation is trivial. No evidence of tricuspid stenosis.  Aortic Valve: The aortic valve is tricuspid. There is mild calcification of the aortic valve. Aortic valve regurgitation is mild. Aortic valve sclerosis/calcification is present, without any evidence of aortic stenosis.  Pulmonic Valve: The pulmonic valve was normal in structure. Pulmonic valve regurgitation is trivial. No evidence of pulmonic stenosis.  Aorta: Aortic dilatation noted. There is borderline dilatation of the aortic root, measuring 38 mm. There is borderline dilatation of the ascending aorta, measuring 38 mm.  Venous: The inferior vena cava is dilated in size with greater than 50% respiratory variability, suggesting right atrial pressure of 8 mmHg.  IAS/Shunts: No atrial level shunt detected by color flow Doppler.   LEFT VENTRICLE PLAX 2D LVIDd:         4.70 cm      Diastology LVIDs:         4.10 cm      LV e' medial:    4.29 cm/s LV PW:         1.40 cm      LV E/e' medial:  27.0 LV IVS:        1.40 cm      LV e' lateral:    5.08 cm/s LVOT diam:     2.10 cm      LV E/e' lateral: 22.8 LV SV:         70 LV SV Index:   32 LVOT Area:     3.46 cm  LV Volumes (MOD) LV vol d, MOD A2C: 127.0 ml LV vol d, MOD A4C: 134.0 ml LV vol s, MOD A2C: 92.8 ml LV vol s, MOD A4C: 88.9 ml LV SV MOD A2C:  34.2 ml LV SV MOD A4C:     134.0 ml LV SV MOD BP:      41.2 ml  RIGHT VENTRICLE RV Basal diam:  3.40 cm RV S prime:     13.10 cm/s TAPSE (M-mode): 3.1 cm  LEFT ATRIUM             Index        RIGHT ATRIUM           Index LA diam:        3.40 cm 1.53 cm/m   RA Area:     18.00 cm LA Vol (A2C):   76.0 ml 34.28 ml/m  RA Volume:   47.90 ml  21.61 ml/m LA Vol (A4C):   52.0 ml 23.46 ml/m LA Biplane Vol: 70.1 ml 31.62 ml/m AORTIC VALVE LVOT Vmax:   110.00 cm/s LVOT Vmean:  76.400 cm/s LVOT VTI:    0.203 m  AORTA Ao Root diam: 3.80 cm Ao Asc diam:  3.80 cm  MITRAL VALVE MV Area (PHT): 8.43 cm     SHUNTS MV Decel Time: 90 msec      Systemic VTI:  0.20 m MV E velocity: 116.00 cm/s  Systemic Diam: 2.10 cm MV A velocity: 60.00 cm/s MV E/A ratio:  1.93  Toribio Fuel MD Electronically signed by Toribio Fuel MD Signature Date/Time: 05/06/2024/12:35:30 PM    Final          ______________________________________________________________________________________________       Current Reported Medications:.    Current Meds  Medication Sig   albuterol  (VENTOLIN  HFA) 108 (90 Base) MCG/ACT inhaler Inhale 2 puffs into the lungs every 6 (six) hours as needed for wheezing or shortness of breath.   benzonatate  (TESSALON ) 200 MG capsule Take 1 capsule (200 mg total) by mouth 3 (three) times daily as needed for cough.   Blood Glucose Monitoring Suppl (FREESTYLE FREEDOM LITE) w/Device KIT Use Freestyle Freedom Lite meter to check blood sugar twice daily. DX:E11.65   empagliflozin  (JARDIANCE ) 25 MG TABS tablet Take 1 tablet (25 mg total) by mouth daily before breakfast. For diabetes E11.65 and E 11.29    gabapentin  (NEURONTIN ) 300 MG capsule Take 300 mg by mouth 2 (two) times daily.   glucose blood (FREESTYLE LITE) test strip USE AS INSTRUCTED   hydrOXYzine  (ATARAX ) 25 MG tablet Take 1 tablet by mouth at bedtime as needed for anxiety.   metFORMIN  (GLUCOPHAGE ) 500 MG tablet Take 1 tablet (500 mg total) by mouth 2 (two) times daily with a meal. (Patient taking differently: Take 500-1,000 mg by mouth 2 (two) times daily with a meal. Take one tablet in the morning and take two tablets in the evening)   Multiple Vitamin (MULTIVITAMIN WITH MINERALS) TABS tablet Take 1 tablet by mouth daily.   OXYGEN  Inhale 2 L/min into the lungs continuous.   sacubitril -valsartan  (ENTRESTO ) 24-26 MG Take 1 tablet by mouth 2 (two) times daily.   tamsulosin  (FLOMAX ) 0.4 MG CAPS capsule Take 1 capsule (0.4 mg total) by mouth daily after supper.   Tiotropium Bromide -Olodaterol (STIOLTO RESPIMAT ) 2.5-2.5 MCG/ACT AERS Inhale 2 puffs into the lungs daily.   [DISCONTINUED] atorvastatin  (LIPITOR) 40 MG tablet Take 1 tablet (40 mg total) by mouth daily.   [DISCONTINUED] clopidogrel  (PLAVIX ) 75 MG tablet Take 1 tablet (75 mg total) by mouth daily with breakfast.   [DISCONTINUED] furosemide  (LASIX ) 40 MG tablet Take 1 tablet (40 mg total) by mouth daily.   [DISCONTINUED] metoprolol  succinate (TOPROL -XL) 100 MG 24 hr tablet Take  1 tablet (100 mg total) by mouth in the morning and at bedtime. Take with or immediately following a meal.   [DISCONTINUED] spironolactone  (ALDACTONE ) 25 MG tablet Take 0.5 tablets (12.5 mg total) by mouth daily.    Physical Exam:    VS:  BP (!) 106/54   Pulse 62   Ht 6' 2 (1.88 m)   Wt 207 lb (93.9 kg)   SpO2 95%   BMI 26.58 kg/m    Wt Readings from Last 3 Encounters:  05/22/24 207 lb (93.9 kg)  05/21/24 208 lb 3.2 oz (94.4 kg)  05/20/24 208 lb (94.3 kg)    GEN: Well nourished, well developed in no acute distress NECK: No JVD; No carotid bruits CARDIAC: RRR, no murmurs, rubs,  gallops RESPIRATORY:  Clear to auscultation without rales, wheezing or rhonchi  ABDOMEN: Soft, non-tender, non-distended EXTREMITIES:  No edema; No acute deformity     Asessement and Plan:.    CHF: LVEF 40% in 2024, had improved to 55 to 60% in 09/2023.  Patient recently admitted with acute on chronic systolic heart failure, pericardial and pleural effusion.  Echo on 8/20 indicated LVEF 20 to 25%.  Patient has undergone chemotherapy between 2 studies.  It has been questioned if decline in EF is related to chemotherapy.  Patient does have history of CAD with previous stent placements, most recent ischemic evaluation in 02/2023 indicated nonobstructive CAD with continued patency of stents in the LAD and RCA.  Patient today denies any chest pain, reports stable shortness of breath which he feels is related to his history of COPD and lung cancer.  He denies any significant changes.  Patient reports that he has been feeling very well since his most recent hospital discharge, denies any concerns or complaints today.  Discussed ischemic evaluation, patient deferred at this time in favor of continuing with consistent GDMT, patient notes that he believes he was only recently started on Entresto , does not believe he was taking this medication prior to hospitalizations.  Will plan to repeat echocardiogram in 2 months, can consider ischemic evaluation at that time if no improvement.  Patient notes that he would appreciate referral to cardio oncology clinic with Dr. Zenaida. Continue metoprolol  succinate, Jardiance  25 mg daily, Lasix  40 mg daily, Entresto  24-26 mg twice daily and spironolactone  12.5 mg daily.  CAD: Patient with known history of CAD, patient with RCA stent in 2006, and 12/2021 had DES to RCA, DES to LAD, residual LCx disease treated medically. LHC in 02/2023 indicated nonobstructive CAD, continued patency of stents in the LAD and RCA.  Patient denies any chest pain, notes that his breathing is stable, denies  any recent significant changes.  Discussed ischemic evaluation with patient's, he is elected to defer ischemic evaluation at this time, will plan to repeat echo in 2 months, if remains reduced with consistent GDMT can consider ischemic evaluation at that time.  Reviewed ED precautions.  Continue Lipitor 40 mg daily, Plavix  75 mg daily, Jardiance  25 mg daily, Lasix  40 mg daily, Entresto  24-26 mg twice daily and spironolactone  12.5 mg daily.  Pericardial effusion: CT scan completed on arrival indicated a large pericardial effusion however on follow-up echocardiogram was noted to be small without evidence of tamponade.  Will check echocardiogram in 8 weeks for ongoing monitoring.  Lung cancer: In January 2025 patient was diagnosed with stage IIIa non-small cell lung cancer, squamous cell carcinoma presented with left upper lobe lung mass abutting the pleura and ipsilateral mediastinal and left hilar lymph  nodes.  On chart review patient has been on concurrent chemoradiation with weekly carboplatin  and placlitaxel, was also on immunotherapy with Imfinzi , discontinued due to worsening CHF of unclear etiology.  HTN: Blood pressure today 106/54.  Continue Jardiance  25 mg daily, Lasix  40 mg daily, metoprolol  succinate 100 mg daily, Entresto  24-26 mg twice daily and spironolactone  12.5 mg daily.  COPD: Patient with history of severe COPD.  He is followed by pulmonology.  On 2 L oxygen  nasal cannula.  HLD: Last lipid profile on 05/09/2024 indicated total cholesterol 124, HDL 49, triglyceride 72 and LDL 61.  Continue atorvastatin  40 mg daily.   Disposition: F/u with Bernal Luhman, NP in 8 weeks.   Signed, Aidin Doane D Solae Norling, NP

## 2024-05-22 ENCOUNTER — Telehealth: Payer: Self-pay | Admitting: Physician Assistant

## 2024-05-22 ENCOUNTER — Encounter: Payer: Self-pay | Admitting: Cardiology

## 2024-05-22 ENCOUNTER — Ambulatory Visit: Attending: Cardiology | Admitting: Cardiology

## 2024-05-22 VITALS — BP 106/54 | HR 62 | Ht 74.0 in | Wt 207.0 lb

## 2024-05-22 DIAGNOSIS — J449 Chronic obstructive pulmonary disease, unspecified: Secondary | ICD-10-CM | POA: Insufficient documentation

## 2024-05-22 DIAGNOSIS — E782 Mixed hyperlipidemia: Secondary | ICD-10-CM | POA: Diagnosis not present

## 2024-05-22 DIAGNOSIS — C3412 Malignant neoplasm of upper lobe, left bronchus or lung: Secondary | ICD-10-CM | POA: Diagnosis not present

## 2024-05-22 DIAGNOSIS — I10 Essential (primary) hypertension: Secondary | ICD-10-CM | POA: Insufficient documentation

## 2024-05-22 DIAGNOSIS — I2510 Atherosclerotic heart disease of native coronary artery without angina pectoris: Secondary | ICD-10-CM | POA: Diagnosis not present

## 2024-05-22 DIAGNOSIS — I5042 Chronic combined systolic (congestive) and diastolic (congestive) heart failure: Secondary | ICD-10-CM | POA: Insufficient documentation

## 2024-05-22 DIAGNOSIS — I3139 Other pericardial effusion (noninflammatory): Secondary | ICD-10-CM | POA: Diagnosis not present

## 2024-05-22 MED ORDER — ATORVASTATIN CALCIUM 40 MG PO TABS: 40.0000 mg | ORAL_TABLET | Freq: Every day | ORAL | 3 refills | Status: DC

## 2024-05-22 MED ORDER — FUROSEMIDE 40 MG PO TABS: 40.0000 mg | ORAL_TABLET | Freq: Every day | ORAL | 0 refills | Status: DC

## 2024-05-22 MED ORDER — ATORVASTATIN CALCIUM 40 MG PO TABS
40.0000 mg | ORAL_TABLET | Freq: Every day | ORAL | 0 refills | Status: DC
Start: 2024-05-22 — End: 2024-05-22

## 2024-05-22 MED ORDER — FUROSEMIDE 40 MG PO TABS: 40.0000 mg | ORAL_TABLET | Freq: Every day | ORAL | 3 refills | Status: AC

## 2024-05-22 MED ORDER — SPIRONOLACTONE 25 MG PO TABS: 12.5000 mg | ORAL_TABLET | Freq: Every day | ORAL | 0 refills | Status: DC

## 2024-05-22 MED ORDER — METOPROLOL SUCCINATE ER 100 MG PO TB24: 100.0000 mg | ORAL_TABLET | Freq: Two times a day (BID) | ORAL | 0 refills | Status: DC

## 2024-05-22 MED ORDER — CLOPIDOGREL BISULFATE 75 MG PO TABS: 75.0000 mg | ORAL_TABLET | Freq: Every day | ORAL | 0 refills | Status: DC

## 2024-05-22 MED ORDER — METOPROLOL SUCCINATE ER 100 MG PO TB24
100.0000 mg | ORAL_TABLET | Freq: Two times a day (BID) | ORAL | 3 refills | Status: DC
Start: 2024-05-22 — End: 2024-06-09

## 2024-05-22 MED ORDER — SPIRONOLACTONE 25 MG PO TABS: 12.5000 mg | ORAL_TABLET | Freq: Every day | ORAL | 3 refills | Status: DC

## 2024-05-22 MED ORDER — CLOPIDOGREL BISULFATE 75 MG PO TABS: 75.0000 mg | ORAL_TABLET | Freq: Every day | ORAL | 3 refills | Status: DC

## 2024-05-22 NOTE — Telephone Encounter (Signed)
 Canceled and scheduled appointments per 9/4 los. Talked with the patient and he is aware of the made and canceled appointments.

## 2024-05-22 NOTE — Patient Instructions (Signed)
 Medication Instructions:   Your physician recommends that you continue on your current medications as directed. Please refer to the Current Medication list given to you today.   *If you need a refill on your cardiac medications before your next appointment, please call your pharmacy*   Lab Work: NONE ORDERED  TODAY     If you have labs (blood work) drawn today and your tests are completely normal, you will receive your results only by: MyChart Message (if you have MyChart) OR A paper copy in the mail If you have any lab test that is abnormal or we need to change your treatment, we will call you to review the results.  Testing/Procedures: IN 2 MONTHS Your physician has requested that you have an echocardiogram. Echocardiography is a painless test that uses sound waves to create images of your heart. It provides your doctor with information about the size and shape of your heart and how well your heart's chambers and valves are working. This procedure takes approximately one hour. There are no restrictions for this procedure. Please do NOT wear cologne, perfume, aftershave, or lotions (deodorant is allowed). Please arrive 15 minutes prior to your appointment time.  Please note: We ask at that you not bring children with you during ultrasound (echo/ vascular) testing. Due to room size and safety concerns, children are not allowed in the ultrasound rooms during exams. Our front office staff cannot provide observation of children in our lobby area while testing is being conducted. An adult accompanying a patient to their appointment will only be allowed in the ultrasound room at the discretion of the ultrasound technician under special circumstances. We apologize for any inconvenience.     Follow-Up: At [facility], you and your health needs are our priority.  As part of our continuing mission to provide you with exceptional heart care, our providers are all part of one team.  This  team includes your primary Cardiologist (physician) and Advanced Practice Providers or APPs (Physician Assistants and Nurse Practitioners) who all work together to provide you with the care you need, when you need it.  Your next appointment:  YOU HAVE BEEN REFERRED TO [facility] FOR CARDI ONCOLOGY                                                           AND                                3 month(s)  Provider: Katlyn West, NP    We recommend signing up for the patient portal called MyChart.  Sign up information is provided on this After Visit Summary.  MyChart is used to connect with patients for Virtual Visits (Telemedicine).  Patients are able to view lab/test results, encounter notes, upcoming appointments, etc.  Non-urgent messages can be sent to your provider as well.   To learn more about what you can do with MyChart, go to [email or web address].   Other Instructions

## 2024-05-23 ENCOUNTER — Encounter: Payer: Self-pay | Admitting: Cardiology

## 2024-05-27 ENCOUNTER — Other Ambulatory Visit: Payer: Self-pay

## 2024-05-27 NOTE — Patient Instructions (Signed)
 Visit Information  Thank you for taking time to visit with me today. Please don't hesitate to contact me if I can be of assistance to you before our next scheduled telephone appointment.  Our next appointment is by telephone on Wednesday September 17th at 11:00am  Following is a copy of your care plan:   Goals Addressed             This Visit's Progress    VBCI Transitions of Care (TOC) Care Plan       Problems: (reviewed 05/27/24) Recent Hospitalization for treatment of CHF Knowledge Deficit Related to Congestive Heart Failure and Medication management barrier Patient is getting [facility] and PCP, cardiologist and gets confused with which ones to take.  Goal: (reviewed 05/27/24) Over the next 30 days, the patient will not experience hospital readmission  Interventions: (reviewed 05/27/24)  Heart Failure Interventions: Provided education on low sodium diet Assessed need for readable accurate scales in home Provided education about placing scale on hard, flat surface Advised patient to weigh each morning after emptying bladder Discussed importance of daily weight and advised patient to weigh and record daily Reviewed role of diuretics in prevention of fluid overload and management of heart failure; Discussed the importance of keeping all appointments with provider Oxygen  2l/min continuous Monitor O2 sats and maintain the level above 90% Monitor Blood Glucose daily  Patient Self Care Activities: (reviewed 05/20/24) Attend all scheduled provider appointments Call pharmacy for medication refills 3-7 days in advance of running out of medications Call provider office for new concerns or questions  Notify RN Care Manager of [facility] call rescheduling needs Participate in Transition of Care Program/Attend TOC scheduled calls Perform all self care activities independently  Take medications as prescribed   use salt in moderation watch for swelling in feet, ankles and legs  every day track symptoms and what helps feel better or worse  Plan:  Next PCP appointment scheduled for: Patient will call for appointment - 05/20/24 reviewed with the patient who will call and scheduled an appointment and include flu shot Reviewed 05/26/24 to schedule PCP appointment which he declined Telephone follow up appointment with care management team member scheduled for: Wednesday September 17th at 11:00am        Patient verbalizes understanding of instructions and care plan provided today and agrees to view in MyChart. Active MyChart status and patient understanding of how to access instructions and care plan via MyChart confirmed with patient.     The patient has been provided with contact information for the care management team and has been advised to call with any health related questions or concerns.   Please call the care guide team at [phone] if you need to cancel or reschedule your appointment.   Please call the Suicide and Crisis Lifeline: 988 call the [place]  National Suicide Prevention Lifeline: 716-859-4315 or TTY: 414-741-6722 TTY 636-091-0127) to talk to a trained counselor if you are experiencing a Mental Health or Behavioral Health Crisis or need someone to talk to.  Medford Balboa, BSN, RN [place]  VBCI - [employer] [facility] RN Care Manager [phone]

## 2024-05-27 NOTE — Transitions of Care (Post Inpatient/ED Visit) (Signed)
 Transition of Care week 3  Visit Note  05/27/2024  Name: Jonathan [name]. MRN: [id number]          DOB: 06-23-1936  Situation: Patient enrolled in [facility] 30-day program. Visit completed with Lynwood Nelwyn  by telephone.   Background:   Past Medical History:  Diagnosis Date   CAD (coronary artery disease)    Two RCA stents remotely / 3rd RCA stent 2006   Colon polyps    s/p several Cscopes.   COPD (chronic obstructive pulmonary disease) (HCC)    on O2, nocturnal   Diabetes mellitus    dx aprox 2009   ED (erectile dysfunction)    has a vacumm device   Ejection fraction    Hyperlipidemia    dx in 90s   Hypertension    dx in the 21   Hypogonadism male    PVD (peripheral vascular disease) (HCC)    s/p stents at LE 2009, Dr Ladona   Shortness of breath    O2 Sat dropped to 82% walking on the treadmill, September, 2012    Assessment: Patient Reported Symptoms: Cognitive Cognitive Status: Alert and oriented to person, place, and time, Struggling with memory recall      Neurological Neurological Review of Symptoms: Numbness    HEENT HEENT Symptoms Reported: No symptoms reported      Cardiovascular Cardiovascular Symptoms Reported: No symptoms reported Cardiovascular Management Strategies: Medication therapy, Adequate rest, Weight management Do You Have a Working Readable Scale?: Yes Weight: 207 lb (93.9 kg)  Respiratory Respiratory Symptoms Reported: Shortness of breath, Dry cough Other Respiratory Symptoms: O2 sat is 93% at rest Respiratory Management Strategies: Oxygen  therapy, Routine screening, Medication therapy, Fluid modification, Adequate rest  Endocrine Endocrine Symptoms Reported: No symptoms reported Is patient diabetic?: Yes Is patient checking blood sugars at home?: Yes List most recent blood sugar readings, include date and time of day: 130 05/26/24    Gastrointestinal Gastrointestinal Symptoms Reported: No symptoms reported      Genitourinary  Genitourinary Symptoms Reported: No symptoms reported    Integumentary Integumentary Symptoms Reported: No symptoms reported    Musculoskeletal Musculoskelatal Symptoms Reviewed: No symptoms reported Musculoskeletal Management Strategies: Routine screening, Adequate rest, Activity      Psychosocial Psychosocial Symptoms Reported: No symptoms reported Additional Psychological Details: The patient cares for his wife who has dementia. He does have a paid caregiver Monday through Saturday from 10am to 8pm who assists with both of them to live independently at home.         There were no vitals filed for this visit.  Medications Reviewed Today     Reviewed by Moises Reusing, RN (Case Manager) on 05/27/24 at 1107  Med List Status: <None>   Medication Order Taking? Sig Documenting Provider Last Dose Status Informant  albuterol  (VENTOLIN  HFA) 108 (90 Base) MCG/ACT inhaler [id number]  Inhale 2 puffs into the lungs every 6 (six) hours as needed for wheezing or shortness of breath. Neysa Reggy BIRCH, MD  Active Self, Other, Pharmacy Records, Multiple Informants  atorvastatin  (LIPITOR) 40 MG tablet [id number]  Take 1 tablet (40 mg total) by mouth daily. West, Katlyn D, NP  Active   benzonatate  (TESSALON ) 200 MG capsule [id number]  Take 1 capsule (200 mg total) by mouth 3 (three) times daily as needed for cough. Neysa Reggy BIRCH, MD  Active Self, Other, Pharmacy Records, Multiple Informants  Blood Glucose Monitoring Suppl (FREESTYLE FREEDOM LITE) w/Device KIT [id number]  Use Freestyle Freedom Lite meter to check blood  sugar twice daily. DX:E11.65 Von [place], MD  Active Self, Pharmacy Records, Other, Multiple Informants  clopidogrel  (PLAVIX ) 75 MG tablet [id number]  Take 1 tablet (75 mg total) by mouth daily with breakfast. West, Katlyn D, NP  Active   empagliflozin  (JARDIANCE ) 25 MG TABS tablet [id number]  Take 1 tablet (25 mg total) by mouth daily before breakfast. For diabetes E11.65 and E 11.29 Thapa,  [place], MD  Active Self, Pharmacy Records, Other, Multiple Informants  furosemide  (LASIX ) 40 MG tablet [id number]  Take 1 tablet (40 mg total) by mouth daily. West, Katlyn D, NP  Active   gabapentin  (NEURONTIN ) 300 MG capsule [id number]  Take 300 mg by mouth 2 (two) times daily. [provider]  Active Self, Other, Pharmacy Records, Multiple Informants  glucose blood (FREESTYLE LITE) test strip [id number]  USE AS INSTRUCTED Von Pacific, MD  Active Self, Pharmacy Records, Other, Multiple Informants  hydrOXYzine  (ATARAX ) 25 MG tablet [id number]  Take 1 tablet by mouth at bedtime as needed for anxiety. [provider]  Active Self, Pharmacy Records, Other, Multiple Informants  metFORMIN  (GLUCOPHAGE ) 500 MG tablet [id number]  Take 1 tablet (500 mg total) by mouth 2 (two) times daily with a meal.  Patient taking differently: Take 500-1,000 mg by mouth 2 (two) times daily with a meal. Take one tablet in the morning and take two tablets in the evening   Thapa, [place], MD  Active Self, Pharmacy Records, Other, Multiple Informants  metoprolol  succinate (TOPROL -XL) 100 MG 24 hr tablet [id number]  Take 1 tablet (100 mg total) by mouth in the morning and at bedtime. Take with or immediately following a meal. West, Katlyn D, NP  Active   Multiple Vitamin (MULTIVITAMIN WITH MINERALS) TABS tablet [id number]  Take 1 tablet by mouth daily. [provider]  Active Self, Pharmacy Records, Other, Multiple Informants  OXYGEN  [id number]  Inhale 2 L/min into the lungs continuous. [provider]  Active Self, Pharmacy Records, Other, Multiple Informants           Med Note (CANTER, LILA D   Wed Apr 29, 2024 12:56 PM)    sacubitril -valsartan  (ENTRESTO ) 24-26 MG [id number]  Take 1 tablet by mouth 2 (two) times daily. Fairy Frames, MD  Active Self, Pharmacy Records, Other, Multiple Informants  spironolactone  (ALDACTONE ) 25 MG tablet [id number]  Take 0.5 tablets (12.5 mg total) by mouth daily. West,  Katlyn D, NP  Active   tamsulosin  (FLOMAX ) 0.4 MG CAPS capsule [id number]  Take 1 capsule (0.4 mg total) by mouth daily after supper. Paz, Jose E, MD  Active Self, Pharmacy Records, Other, Multiple Informants  Tiotropium Bromide -Olodaterol (STIOLTO RESPIMAT ) 2.5-2.5 MCG/ACT AERS [id number]  Inhale 2 puffs into the lungs daily. Neysa Reggy BIRCH, MD  Active Self, Other, Pharmacy Records, Multiple Informants  Med List Note Steffi Nian, CPhT 05/06/24 1403): [employer]             Recommendation:   PCP Follow-up Continue Current Plan of Care  Follow Up Plan:   Telephone follow-up in 1 week  [employer], BSN, RN [employer] [facility]  VBCI - [employer] [facility] RN Care Manager [phone]

## 2024-06-01 DIAGNOSIS — Z23 Encounter for immunization: Secondary | ICD-10-CM | POA: Diagnosis not present

## 2024-06-03 ENCOUNTER — Other Ambulatory Visit: Payer: Self-pay

## 2024-06-03 NOTE — Patient Instructions (Signed)
 Visit Information  Thank you for taking time to visit with me today. Please don't hesitate to contact me if I can be of assistance to you before our next scheduled telephone appointment.  Our next appointment is by telephone on Thursday September 25th at 11:00am  Following is a copy of your care plan:   Goals Addressed             This Visit's Progress    VBCI Transitions of Care (TOC) Care Plan       Problems: (reviewed 06/03/24) Recent Hospitalization for treatment of CHF Knowledge Deficit Related to Congestive Heart Failure and Medication management barrier Patient is getting medications for [facility] physicians and PCP, cardiologist and gets confused with which ones to take.  Goal: (reviewed 06/03/24) Over the next 30 days, the patient will not experience hospital readmission  Interventions: (reviewed 06/03/24)  Heart Failure Interventions: Provided education on low sodium diet Assessed need for readable accurate scales in home Provided education about placing scale on hard, flat surface Advised patient to weigh each morning after emptying bladder Discussed importance of daily weight and advised patient to weigh and record daily Reviewed role of diuretics in prevention of fluid overload and management of heart failure; Discussed the importance of keeping all appointments with provider Oxygen  2l/min continuous Monitor O2 sats and maintain the level above 90% Monitor Blood Glucose daily 9/17 - He states his scale says low so he hasn't been weighing. Encouraged him to check the battery or purchase a new scale for daily weights  Patient Self Care Activities: (reviewed 06/03/24) Attend all scheduled provider appointments Call pharmacy for medication refills 3-7 days in advance of running out of medications Call provider office for new concerns or questions  Notify RN Care Manager of [facility] call rescheduling needs Participate in Transition of Care Program/Attend TOC scheduled  calls Perform all self care activities independently  Take medications as prescribed   use salt in moderation watch for swelling in feet, ankles and legs every day track symptoms and what helps feel better or worse  Plan:  Next PCP appointment scheduled for: Patient will call for appointment - 05/20/24 reviewed with the patient who will call and scheduled an appointment and include flu shot Reviewed 05/26/24 to schedule PCP appointment which he declined 06/03/24 - He received his flu shot at [place]. He will schedule with his PCP if needed Telephone follow up appointment with care management team member scheduled for: Thursday, September 25th at 11:00am        Patient verbalizes understanding of instructions and care plan provided today and agrees to view in MyChart. Active MyChart status and patient understanding of how to access instructions and care plan via MyChart confirmed with patient.     The patient has been provided with contact information for the care management team and has been advised to call with any health related questions or concerns.   Please call the care guide team at [phone] if you need to cancel or reschedule your appointment.   Please call the Suicide and Crisis Lifeline: 988 call the [place]  National Suicide Prevention Lifeline: (480) 396-8786 or TTY: (203) 137-3252 TTY (220)716-0804) to talk to a trained counselor if you are experiencing a Mental Health or Behavioral Health Crisis or need someone to talk to.  Medford Balboa, BSN, RN [place]  VBCI - [employer] [facility] RN Care Manager [phone]

## 2024-06-03 NOTE — Transitions of Care (Post Inpatient/ED Visit) (Signed)
 Transition of Care week 4  Visit Note  06/03/2024  Name: Jonathan [name]. MRN: [id number]          DOB: Mar 02, 1936  Situation: Patient enrolled in [facility] 30-day program. Visit completed with Lynwood Nelwyn by telephone.   Background:   Past Medical History:  Diagnosis Date   CAD (coronary artery disease)    Two RCA stents remotely / 3rd RCA stent 2006   Colon polyps    s/p several Cscopes.   COPD (chronic obstructive pulmonary disease) (HCC)    on O2, nocturnal   Diabetes mellitus    dx aprox 2009   ED (erectile dysfunction)    has a vacumm device   Ejection fraction    Hyperlipidemia    dx in 90s   Hypertension    dx in the 49   Hypogonadism male    PVD (peripheral vascular disease) (HCC)    s/p stents at LE 2009, Dr Ladona   Shortness of breath    O2 Sat dropped to 82% walking on the treadmill, September, 2012    Assessment: Patient Reported Symptoms: Cognitive Cognitive Status: Alert and oriented to person, place, and time, Able to follow simple commands      Neurological Neurological Review of Symptoms: No symptoms reported    HEENT HEENT Symptoms Reported: No symptoms reported      Cardiovascular Cardiovascular Symptoms Reported: No symptoms reported Cardiovascular Management Strategies: Medication therapy, Weight management, Adequate rest, Activity, Diet modification Do You Have a Working Readable Scale?: Yes Weight: 207 lb (93.9 kg) Cardiovascular Comment: His current scale is not working. He will check the batteries or purchase a new scale  Respiratory Respiratory Symptoms Reported: No symptoms reported Respiratory Management Strategies: Oxygen  therapy, Routine screening, Medication therapy, Fluid modification, Weight management  Endocrine Endocrine Symptoms Reported: No symptoms reported Is patient diabetic?: Yes Is patient checking blood sugars at home?: Yes List most recent blood sugar readings, include date and time of day: 120 06/03/24     Gastrointestinal Gastrointestinal Symptoms Reported: No symptoms reported Additional Gastrointestinal Details: Occasional loose stools Gastrointestinal Management Strategies: Coping strategies Gastrointestinal Comment: The patient reports a decrease in appetite. He states he is eating but hasn't been as hungry. He drinks a Boost daily    Genitourinary Genitourinary Symptoms Reported: No symptoms reported    Integumentary Integumentary Symptoms Reported: No symptoms reported    Musculoskeletal Musculoskelatal Symptoms Reviewed: No symptoms reported Musculoskeletal Management Strategies: Activity, Routine screening Musculoskeletal Comment: States he walks up and down the stairs and walks down to the mailbox. Declines PT      Psychosocial Psychosocial Symptoms Reported: No symptoms reported         There were no vitals filed for this visit.  Medications Reviewed Today     Reviewed by Moises Reusing, RN (Case Manager) on 06/03/24 at 1109  Med List Status: <None>   Medication Order Taking? Sig Documenting Provider Last Dose Status Informant  albuterol  (VENTOLIN  HFA) 108 (90 Base) MCG/ACT inhaler [id number]  Inhale 2 puffs into the lungs every 6 (six) hours as needed for wheezing or shortness of breath. Neysa Reggy BIRCH, MD  Active Self, Other, Pharmacy Records, Multiple Informants  atorvastatin  (LIPITOR) 40 MG tablet [id number]  Take 1 tablet (40 mg total) by mouth daily. West, Katlyn D, NP  Active   benzonatate  (TESSALON ) 200 MG capsule [id number]  Take 1 capsule (200 mg total) by mouth 3 (three) times daily as needed for cough. Neysa Reggy BIRCH, MD  Active Self,  Other, Pharmacy Records, Multiple Informants  Blood Glucose Monitoring Suppl (FREESTYLE FREEDOM LITE) w/Device KIT [id number]  Use Freestyle Freedom Lite meter to check blood sugar twice daily. DX:E11.65 Von [place], MD  Active Self, Pharmacy Records, Other, Multiple Informants  clopidogrel  (PLAVIX ) 75 MG tablet [id number]  Take 1  tablet (75 mg total) by mouth daily with breakfast. West, Katlyn D, NP  Active   empagliflozin  (JARDIANCE ) 25 MG TABS tablet [id number]  Take 1 tablet (25 mg total) by mouth daily before breakfast. For diabetes E11.65 and E 11.29 Thapa, [place], MD  Active Self, Pharmacy Records, Other, Multiple Informants  furosemide  (LASIX ) 40 MG tablet [id number]  Take 1 tablet (40 mg total) by mouth daily. West, Katlyn D, NP  Active   gabapentin  (NEURONTIN ) 300 MG capsule [id number]  Take 300 mg by mouth 2 (two) times daily. [provider]  Active Self, Other, Pharmacy Records, Multiple Informants  glucose blood (FREESTYLE LITE) test strip [id number]  USE AS INSTRUCTED Von [place], MD  Active Self, Pharmacy Records, Other, Multiple Informants  hydrOXYzine  (ATARAX ) 25 MG tablet [id number]  Take 1 tablet by mouth at bedtime as needed for anxiety. [provider]  Active Self, Pharmacy Records, Other, Multiple Informants  metFORMIN  (GLUCOPHAGE ) 500 MG tablet [id number]  Take 1 tablet (500 mg total) by mouth 2 (two) times daily with a meal.  Patient taking differently: Take 500-1,000 mg by mouth 2 (two) times daily with a meal. Take one tablet in the morning and take two tablets in the evening   Thapa, [place], MD  Active Self, Pharmacy Records, Other, Multiple Informants  metoprolol  succinate (TOPROL -XL) 100 MG 24 hr tablet [id number]  Take 1 tablet (100 mg total) by mouth in the morning and at bedtime. Take with or immediately following a meal. West, Katlyn D, NP  Active   Multiple Vitamin (MULTIVITAMIN WITH MINERALS) TABS tablet [id number]  Take 1 tablet by mouth daily. [provider]  Active Self, Pharmacy Records, Other, Multiple Informants  OXYGEN  [id number]  Inhale 2 L/min into the lungs continuous. [provider]  Active Self, Pharmacy Records, Other, Multiple Informants           Med Note (CANTER, LILA D   Wed Apr 29, 2024 12:56 PM)    sacubitril -valsartan  (ENTRESTO ) 24-26 MG  [id number]  Take 1 tablet by mouth 2 (two) times daily. Fairy Frames, MD  Active Self, Pharmacy Records, Other, Multiple Informants  spironolactone  (ALDACTONE ) 25 MG tablet [id number]  Take 0.5 tablets (12.5 mg total) by mouth daily. West, Katlyn D, NP  Active   tamsulosin  (FLOMAX ) 0.4 MG CAPS capsule [id number]  Take 1 capsule (0.4 mg total) by mouth daily after supper. Paz, Jose E, MD  Active Self, Pharmacy Records, Other, Multiple Informants  Tiotropium Bromide -Olodaterol (STIOLTO RESPIMAT ) 2.5-2.5 MCG/ACT AERS [id number]  Inhale 2 puffs into the lungs daily. Neysa Reggy BIRCH, MD  Active Self, Other, Pharmacy Records, Multiple Informants  Med List Note Steffi Nian, CPhT 05/06/24 1403): [employer]             Recommendation:   Continue Current Plan of Care  Follow Up Plan:   Closing From:  Transitions of Care Program  [employer], BSN, Charity fundraiser [place]  VBCI - [employer] [facility] RN Care Manager [phone]

## 2024-06-07 ENCOUNTER — Other Ambulatory Visit: Payer: Self-pay | Admitting: Cardiology

## 2024-06-11 ENCOUNTER — Other Ambulatory Visit: Payer: Self-pay

## 2024-06-11 NOTE — Patient Instructions (Signed)
 Visit Information  Thank you for taking time to visit with me today. Please don't hesitate to contact me if I can be of assistance to you before our next scheduled telephone appointment.  Our next appointment is by telephone on Thursday October 2nd at 1:00pm  Following is a copy of your care plan:   Goals Addressed             This Visit's Progress    [facility]BCI Transitions of Care (TOC) Care Plan       Problems: (reviewed 06/11/24) Recent Hospitalization for treatment of CHF Knowledge Deficit Related to Congestive Heart Failure and Medication management barrier Patient is [facility] and [facility], cardiologist and gets confused with which ones to take.  Goal: (reviewed 06/11/24) Over the next 30 days, the patient will not experience hospital readmission  Interventions: (reviewed 06/11/24)  Heart Failure Interventions: Provided education on low sodium diet Assessed need for readable accurate scales in home Provided education about placing scale on hard, flat surface Advised patient to weigh each morning after emptying bladder Discussed importance of daily weight and advised patient to weigh and record daily Reviewed role of diuretics in prevention of fluid overload and management of heart failure; Discussed the importance of keeping all appointments with provider Oxygen  2l/min continuous Monitor O2 sats and maintain the level above 90% Monitor Blood Glucose daily 9/17 - He states his scale says low so he hasn't been weighing. Encouraged him to check the battery or purchase a new scale for daily weights 9/25 - The patient purchased a new scale - weight is 193lbs  Patient Self Care Activities: (reviewed 06/03/24) Attend all scheduled provider appointments Call pharmacy for medication refills 3-7 days in advance of running out of medications Call provider office for new concerns or questions  Notify RN Care Manager of [facility] call rescheduling needs Participate in  Transition of Care Program/Attend TOC scheduled calls Perform all self care activities independently  Take medications as prescribed   use salt in moderation watch for swelling in feet, ankles and legs every day track symptoms and what helps feel better or worse  Plan:  Next PCP appointment scheduled for: Patient will call for appointment - 05/20/24 reviewed with the patient who will call and scheduled an appointment and include flu shot Reviewed 05/26/24 to schedule PCP appointment which he declined 06/03/24 - He received his flu shot at [place]. He will schedule with his PCP if needed Telephone follow up appointment with care management team member scheduled for: Thursday, October 2nd at 1:00pm        Patient verbalizes understanding of instructions and care plan provided today and agrees to view in MyChart. Active MyChart status and patient understanding of how to access instructions and care plan via MyChart confirmed with patient.     The patient has been provided with contact information for the care management team and has been advised to call with any health related questions or concerns.   Please call the care guide team at [phone] if you need to cancel or reschedule your appointment.   Please call the Suicide and Crisis Lifeline: 988 call the [place]  National Suicide Prevention Lifeline: (808)562-7242 or TTY: 360-076-6834 TTY 906-467-0195) to talk to a trained counselor if you are experiencing a Mental Health or Behavioral Health Crisis or need someone to talk to.  Medford Balboa, BSN, RN [place]  VBCI - [employer] [facility] RN Care Manager [phone]

## 2024-06-11 NOTE — Transitions of Care (Post Inpatient/ED Visit) (Signed)
 Transition of Care Week 5  Visit Note  06/11/2024  Name: Jonathan [name]. MRN: [id number]          DOB: [birth year]  Situation: Patient enrolled in [facility] 30-day program. Visit completed with Lynwood Nelwyn by telephone.   Background:   Past Medical History:  Diagnosis Date   CAD (coronary artery disease)    Two RCA stents remotely / 3rd RCA stent 2006   Colon polyps    s/p several Cscopes.   COPD (chronic obstructive pulmonary disease) (HCC)    on O2, nocturnal   Diabetes mellitus    dx aprox 2009   ED (erectile dysfunction)    has a vacumm device   Ejection fraction    Hyperlipidemia    dx in 90s   Hypertension    dx in the 33   Hypogonadism male    PVD (peripheral vascular disease)    s/p stents at [facility] 2009, Dr Ladona   Shortness of breath    O2 Sat dropped to 82% walking on the treadmill, September, 2012    Assessment: Patient Reported Symptoms: Cognitive Cognitive Status: Alert and oriented to person, place, and time, Normal speech and language skills      Neurological Neurological Review of Symptoms: No symptoms reported    HEENT HEENT Symptoms Reported: No symptoms reported      Cardiovascular Cardiovascular Symptoms Reported: No symptoms reported Does patient have uncontrolled Hypertension?: No Cardiovascular Management Strategies: Medication therapy, Routine screening, Medical device, Adequate rest, Coping strategies Do You Have a Working Readable Scale?: Yes Weight: 193 lb (87.5 kg) Cardiovascular Comment: The patient bought a new scale. He states without clothes he is 193lbs. With clothes he is 197lbs  Respiratory Respiratory Symptoms Reported: No symptoms reported Other Respiratory Symptoms: O2 sats at 94% Additional Respiratory Details: He states he will take his oxygen  off at rest and his O2 sats are 94% Respiratory Management Strategies: Oxygen  therapy, Routine screening, Medication therapy, Weight management, Adequate rest  Endocrine Endocrine  Symptoms Reported: Unintentional weight loss Is patient diabetic?: Yes Is patient checking blood sugars at home?: Yes List most recent blood sugar readings, include date and time of day: 120 06/12/24    Gastrointestinal Gastrointestinal Symptoms Reported: No symptoms reported Gastrointestinal Management Strategies: Coping strategies Gastrointestinal Comment: The patient states he has to make himself eat. He does not have muc of an appetite    Genitourinary Genitourinary Symptoms Reported: No symptoms reported    Integumentary Integumentary Symptoms Reported: No symptoms reported    Musculoskeletal Musculoskelatal Symptoms Reviewed: No symptoms reported        Psychosocial Psychosocial Symptoms Reported: No symptoms reported         There were no vitals filed for this visit.  Medications Reviewed Today     Reviewed by Moises Reusing, RN (Case Manager) on 06/11/24 at 1252  Med List Status: <None>   Medication Order Taking? Sig Documenting Provider Last Dose Status Informant  albuterol  (VENTOLIN  HFA) 108 (90 Base) MCG/ACT inhaler [id number]  Inhale 2 puffs into the lungs every 6 (six) hours as needed for wheezing or shortness of breath. Neysa Reggy BIRCH, MD  Active Self, Other, Pharmacy Records, Multiple Informants  atorvastatin  (LIPITOR) 40 MG tablet [id number]  TAKE 1 TABLET BY MOUTH EVERY DAY Court Dorn PARAS, MD  Active   benzonatate  (TESSALON ) 200 MG capsule [id number]  Take 1 capsule (200 mg total) by mouth 3 (three) times daily as needed for cough. Neysa Reggy BIRCH, MD  Active Self, Other,  Pharmacy Records, Multiple Informants  Blood Glucose Monitoring Suppl (FREESTYLE FREEDOM LITE) w/Device KIT [id number]  Use Freestyle Freedom Lite meter to check blood sugar twice daily. DX:E11.65 Von [place], MD  Active Self, Pharmacy Records, Other, Multiple Informants  clopidogrel  (PLAVIX ) 75 MG tablet [id number]  TAKE 1 TABLET BY MOUTH DAILY WITH BREAKFAST. Court Dorn PARAS, MD  Active    empagliflozin  (JARDIANCE ) 25 MG TABS tablet [id number]  Take 1 tablet (25 mg total) by mouth daily before breakfast. For diabetes E11.65 and E 11.29 Thapa, [place], MD  Active Self, Pharmacy Records, Other, Multiple Informants  furosemide  (LASIX ) 40 MG tablet [id number]  Take 1 tablet (40 mg total) by mouth daily. West, Katlyn D, NP  Active   gabapentin  (NEURONTIN ) 300 MG capsule [id number]  Take 300 mg by mouth 2 (two) times daily. [provider]  Active Self, Other, Pharmacy Records, Multiple Informants  glucose blood (FREESTYLE LITE) test strip [id number]  USE AS INSTRUCTED Von Pacific, MD  Active Self, Pharmacy Records, Other, Multiple Informants  hydrOXYzine  (ATARAX ) 25 MG tablet [id number]  Take 1 tablet by mouth at bedtime as needed for anxiety. [provider]  Active Self, Pharmacy Records, Other, Multiple Informants  metFORMIN  (GLUCOPHAGE ) 500 MG tablet [id number]  Take 1 tablet (500 mg total) by mouth 2 (two) times daily with a meal.  Patient taking differently: Take 500-1,000 mg by mouth 2 (two) times daily with a meal. Take one tablet in the morning and take two tablets in the evening   Thapa, [place], MD  Active Self, Pharmacy Records, Other, Multiple Informants  metoprolol  succinate (TOPROL -XL) 100 MG 24 hr tablet [id number]  TAKE 1 TABLET (100 MG TOTAL) BY MOUTH IN THE MORNING AND AT BEDTIME. TAKE WITH OR IMMEDIATELY FOLLOWING A MEAL. Court Dorn PARAS, MD  Active   Multiple Vitamin (MULTIVITAMIN WITH MINERALS) TABS tablet [id number]  Take 1 tablet by mouth daily. [provider]  Active Self, Pharmacy Records, Other, Multiple Informants  OXYGEN  [id number]  Inhale 2 L/min into the lungs continuous. [provider]  Active Self, Pharmacy Records, Other, Multiple Informants           Med Note (CANTER, LILA D   Wed Apr 29, 2024 12:56 PM)    sacubitril -valsartan  (ENTRESTO ) 24-26 MG [id number]  Take 1 tablet by mouth 2 (two) times daily. Fairy Frames, MD  Active  Self, Pharmacy Records, Other, Multiple Informants  spironolactone  (ALDACTONE ) 25 MG tablet [id number]  TAKE 1/2 TABLET BY MOUTH EVERY DAY Court Dorn PARAS, MD  Active   tamsulosin  (FLOMAX ) 0.4 MG CAPS capsule [id number]  Take 1 capsule (0.4 mg total) by mouth daily after supper. Paz, Jose E, MD  Active Self, Pharmacy Records, Other, Multiple Informants  Tiotropium Bromide -Olodaterol (STIOLTO RESPIMAT ) 2.5-2.5 MCG/ACT AERS [id number]  Inhale 2 puffs into the lungs daily. Neysa Reggy BIRCH, MD  Active Self, Other, Pharmacy Records, Multiple Informants  Med List Note Steffi Nian, CPhT 05/06/24 1403): [employer]             Recommendation:   PCP Follow-up Continue Current Plan of Care  Follow Up Plan:   Telephone follow-up in 1 week   [employer], BSN, RN [employer] [facility]  VBCI - [employer] [facility] RN Care Manager [phone]

## 2024-06-12 ENCOUNTER — Inpatient Hospital Stay: Admitting: Family Medicine

## 2024-06-12 ENCOUNTER — Ambulatory Visit: Admitting: Medical

## 2024-06-16 ENCOUNTER — Ambulatory Visit (INDEPENDENT_AMBULATORY_CARE_PROVIDER_SITE_OTHER): Admitting: Endocrinology

## 2024-06-16 ENCOUNTER — Encounter: Payer: Self-pay | Admitting: Endocrinology

## 2024-06-16 VITALS — BP 122/80 | HR 81 | Resp 20 | Ht 74.0 in | Wt 209.2 lb

## 2024-06-16 DIAGNOSIS — Z794 Long term (current) use of insulin: Secondary | ICD-10-CM | POA: Diagnosis not present

## 2024-06-16 DIAGNOSIS — E1165 Type 2 diabetes mellitus with hyperglycemia: Secondary | ICD-10-CM

## 2024-06-16 DIAGNOSIS — Z7984 Long term (current) use of oral hypoglycemic drugs: Secondary | ICD-10-CM

## 2024-06-16 NOTE — Progress Notes (Signed)
 Outpatient Endocrinology Note [place] Jimi Schappert, MD  06/16/24  Patient's Name: Jonathan [name].    DOB: [birth year]    MRN: [id number]                                                    REASON OF VISIT: Follow up of type 2 diabetes mellitus  PCP: [name], MD  HISTORY OF PRESENT ILLNESS:   Jonathan [name]. is a 88 y.o. old male with past medical history listed below, is here for follow up for type 2 diabetes mellitus.   Pertinent Diabetes History: Patient was diagnosed with type 2 diabetes mellitus in 2009.  Patient has relatively controlled type 2 diabetes mellitus.  Chronic Diabetes Complications : Retinopathy: no. Last ophthalmology exam was done on annually, reportedly, following with ophthalmology regularly.  Nephropathy: CKD III, on ACE/ARB /valsartan  Peripheral neuropathy: yes, on gabapentin . Has PAD. Coronary artery disease: yes.  He has CHF. Stroke: no  Relevant comorbidities and cardiovascular risk factors: Obesity: no Body mass index is 26.86 kg/m.  Hypertension: Yes  Hyperlipidemia : Yes, on statin   Current / Home Diabetic regimen includes:  Metformin  500 mg 2 times a day. Jardiance  25 mg daily.  Lantus  was started in March 2025 while eating dexamethasone  IV during chemotherapy treatment for lung cancer.  Prior diabetic medications: Used to be on Trulicity  in the past, concerned about weight loss.  He was on basal insulin  in the past when he was on dexamethasone .  Glycemic data:   No glucose data to review.  Hypoglycemia: Patient has no hypoglycemic episodes. Patient has hypoglycemia awareness.  Factors modifying glucose control: 1.  Diabetic diet assessment: 3 meals a day.  2.  Staying active or exercising: No formal exercise per physically active at home take care of his demented wife.  3.  Medication compliance: compliant all of the time.  # HYPOGONADISM He had decreased libido and erectile dysfunction previously and was found to have  hypogonadism, probably in 2011.He did have a slightly low free testosterone  level in 2012 on treatment. Prolactin level normal.  Previously on AndroGel . With his hemoglobin going up to 19.1 in August 2019 he has been told not to take any testosterone . He was given clomiphene  as a trial but he did not try this. Testosterone  level is persistently low without testosterone  therapy although only mildly decreased now. He has had some fatigue overall but not significant now. Because of his history of erythrocytosis will not start any supplement. He has been followed by hematologist for erythrocytosis.  Note : He was diagnosed with lung cancer in January 2025, following with oncology and was on chemotherapy and radiation therapy.  He received dexamethasone  with chemotherapy.  Interval history  Patient is no longer taking Trulicity  he reports he has not been taking for several months he is concerned about weight loss and he stopped taking it.  He has also been not taking insulin . He stopped few months ago.  He has been taking Jardiance  and metformin  as noted above.  He reports blood sugar has been good 80 - low 100 range.  Glucometer data downloaded however not able to get data to review.  Hemoglobin A1c improved to 6.8% today.  He is no longer requiring dexamethasone .  No other complaints today.  Noted that he was hospitalized in July due  to near syncopal episode in August due to peripheral effusion.  REVIEW OF SYSTEMS As per history of present illness.   PAST MEDICAL HISTORY: Past Medical History:  Diagnosis Date   CAD (coronary artery disease)    Two RCA stents remotely / 3rd RCA stent 2006   Colon polyps    s/p several Cscopes.   COPD (chronic obstructive pulmonary disease) (HCC)    on O2, nocturnal   Diabetes mellitus    dx aprox 2009   ED (erectile dysfunction)    has a vacumm device   Ejection fraction    Hyperlipidemia    dx in 90s   Hypertension    dx in the 43   Hypogonadism  male    PVD (peripheral vascular disease)    s/p stents at LE 2009, Dr Ladona   Shortness of breath    O2 Sat dropped to 82% walking on the treadmill, September, 2012    PAST SURGICAL HISTORY: Past Surgical History:  Procedure Laterality Date   APPENDECTOMY     BRONCHIAL BIOPSY  10/01/2023   Procedure: BRONCHIAL BIOPSIES;  Surgeon: Brenna Adine CROME, DO;  Location: [facility];  Service: Pulmonary;;   BRONCHIAL BRUSHINGS  10/01/2023   Procedure: BRONCHIAL BRUSHINGS;  Surgeon: Brenna Adine CROME, DO;  Location: [facility];  Service: Pulmonary;;   BRONCHIAL WASHINGS  10/01/2023   Procedure: BRONCHIAL WASHINGS;  Surgeon: Brenna Adine CROME, DO;  Location: [facility];  Service: Pulmonary;;   CORONARY STENT INTERVENTION N/A 01/12/2022   Procedure: CORONARY STENT INTERVENTION;  Surgeon: Swazil[place], Peter M, MD;  Location: [facility];  Service: Cardiovascular;  Laterality: N/A;   HEMOSTASIS CONTROL  10/01/2023   Procedure: HEMOSTASIS CONTROL;  Surgeon: Brenna Adine CROME, DO;  Location: [facility];  Service: Pulmonary;;   LEFT HEART CATH AND CORONARY ANGIOGRAPHY N/A 03/13/2023   Procedure: LEFT HEART CATH AND CORONARY ANGIOGRAPHY;  Surgeon: Swazil[place], Peter M, MD;  Location: [facility] INVAS[facility] CV LAB;  Service: Cardiovascular;  Laterality: N/A;   RIGHT/LEFT HEART CATH AND CORONARY ANGIOGRAPHY N/A 01/11/2022   Procedure: RIGHT/LEFT HEART CATH AND CORONARY ANGIOGRAPHY;  Surgeon: Claudene Victory ORN, MD;  Location: [facility];  Service: Cardiovascular;  Laterality: N/A;   TONSILLECTOMY     VIDEO BRONCHOSCOPY  10/01/2023   Procedure: VIDEO BRONCHOSCOPY WITHOUT FLUORO;  Surgeon: Brenna Adine CROME, DO;  Location: [facility];  Service: Pulmonary;;    ALLERGIES: No Known Allergies  FAMILY HISTORY:  Family History  Problem Relation Age of Onset   Heart disease Father    Hypertension Father    Stroke Father    Diabetes Paternal Aunt    Diabetes Maternal Grandmother    Diabetes Other        GM, nephews,  many family members   Hyperlipidemia Other        ?   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   Tobacco Use   Smoking status: Former    Current packs/day: 0.00    Average packs/day: 2.0 packs/day for 30.0 years (60.0 ttl pk-yrs)    Types: Cigarettes    Start date: 06/18/1947  Quit date: 06/17/1977    Years since quitting: 47.0   Smokeless tobacco: Never   Tobacco comments:    2 ppd, quit 1978  Vaping Use   Vaping status: Never Used  Substance and Sexual Activity   Alcohol use: Not Currently   Drug use: No   Sexual activity: Yes  Other Topics Concern   Not on file  Social History Narrative   4 children (lost 1 son)   Wife has 2 children                Social Drivers of Corporate investment banker Strain: Low Risk  (12/17/2023)   Overall Financial Resource Strain (CARDIA)    Difficulty of Paying Living Expenses: Not hard at all  Food Insecurity: No Food Insecurity (05/11/2024)   Hunger Vital Sign    Worried About Running Out of Food in the Last Year: Never true    Ran Out of Food in the Last Year: Never true  Transportation Needs: No Transportation Needs (05/11/2024)   PRAPARE - Administrator, Civil Service (Medical): No    Lack of Transportation (Non-Medical): No  Physical Activity: Inactive (12/17/2023)   Exercise Vital Sign    Days of Exercise per Week: 0 days    Minutes of Exercise per Session: 0 min  Stress: No Stress Concern Present (12/17/2023)   [employer] of Occupational Health - Occupational Stress Questionnaire    Feeling of Stress : Not at all  Social Connections: Moderately Isolated (05/09/2024)   Social Connection and Isolation Panel     Frequency of Communication with Friends and Family: More than three times a week    Frequency of Social Gatherings with Friends and Family: Once a week    Attends Religious Services: Never    Database administrator or Organizations: No    Attends Engineer, structural: Never    Marital Status: Married    MEDICATIONS:  Current Outpatient Medications  Medication Sig Dispense Refill   albuterol  (VENTOLIN  HFA) 108 (90 Base) MCG/ACT inhaler Inhale 2 puffs into the lungs every 6 (six) hours as needed for wheezing or shortness of breath. 54 g 3   atorvastatin  (LIPITOR) 40 MG tablet TAKE 1 TABLET BY MOUTH EVERY DAY 90 tablet 3   benzonatate  (TESSALON ) 200 MG capsule Take 1 capsule (200 mg total) by mouth 3 (three) times daily as needed for cough. 30 capsule 4   Blood Glucose Monitoring Suppl (FREESTYLE FREEDOM LITE) w/Device KIT Use Freestyle Freedom Lite meter to check blood sugar twice daily. DX:E11.65 1 kit 0   clopidogrel  (PLAVIX ) 75 MG tablet TAKE 1 TABLET BY MOUTH DAILY WITH BREAKFAST. 90 tablet 3   empagliflozin  (JARDIANCE ) 25 MG TABS tablet Take 1 tablet (25 mg total) by mouth daily before breakfast. For diabetes E11.65 and E 11.29 90 tablet 3   furosemide  (LASIX ) 40 MG tablet Take 1 tablet (40 mg total) by mouth daily. 90 tablet 3   gabapentin  (NEURONTIN ) 300 MG capsule Take 300 mg by mouth 2 (two) times daily.     glucose blood (FREESTYLE LITE) test strip USE AS INSTRUCTED 100 strip 11   hydrOXYzine  (ATARAX ) 25 MG tablet Take 1 tablet by mouth at bedtime as needed for anxiety.     metFORMIN  (GLUCOPHAGE ) 500 MG tablet Take 1 tablet (500 mg total) by mouth 2 (two) times daily with a meal. (Patient taking differently: Take 500-1,000 mg by mouth 2 (two) times daily with a meal. Take one  tablet in the morning and take two tablets in the evening) 180 tablet 3   metoprolol  succinate (TOPROL -XL) 100 MG 24 hr tablet TAKE 1 TABLET (100 MG TOTAL) BY MOUTH IN THE MORNING AND AT BEDTIME. TAKE  WITH OR IMMEDIATELY FOLLOWING A MEAL. 180 tablet 3   Multiple Vitamin (MULTIVITAMIN WITH MINERALS) TABS tablet Take 1 tablet by mouth daily.     OXYGEN  Inhale 2 L/min into the lungs continuous.     sacubitril -valsartan  (ENTRESTO ) 24-26 MG Take 1 tablet by mouth 2 (two) times daily. 60 tablet 0   spironolactone  (ALDACTONE ) 25 MG tablet TAKE 1/2 TABLET BY MOUTH EVERY DAY 45 tablet 3   tamsulosin  (FLOMAX ) 0.4 MG CAPS capsule Take 1 capsule (0.4 mg total) by mouth daily after supper. 90 capsule 1   Tiotropium Bromide -Olodaterol (STIOLTO RESPIMAT ) 2.5-2.5 MCG/ACT AERS Inhale 2 puffs into the lungs daily. 12 g 4   No current facility-administered medications for this visit.    PHYSICAL EXAM: Vitals:   06/16/24 1343  BP: 122/80  Pulse: 81  Resp: 20  SpO2: 90%  Weight: 209 lb 3.2 oz (94.9 kg)  Height: 6' 2 (1.88 m)  PF: (!) 3 L/min    Body mass index is 26.86 kg/m.  Wt Readings from Last 3 Encounters:  06/16/24 209 lb 3.2 oz (94.9 kg)  06/11/24 193 lb (87.5 kg)  06/03/24 207 lb (93.9 kg)    General: Well developed, well nourished male in no apparent distress.  HEENT: AT/[place], no external lesions.  Eyes: Conjunctiva clear and no icterus. Neck: Neck supple  Lungs: Respirations not labored.  On nasal cannula oxygen . Neurologic: Alert, oriented, normal speech Extremities / Skin: Dry.   Psychiatric: Does not appear depressed or anxious  Diabetic Foot Exam - Simple   No data filed    LABS Reviewed Lab Results  Component Value Date   HGBA1C 6.8 (H) 05/09/2024   HGBA1C 7.4 01/01/2024   HGBA1C 7.9 (H) 11/28/2023   Lab Results  Component Value Date   FRUCTOSAMINE 324 (H) 11/28/2023   FRUCTOSAMINE 250 04/26/2023   FRUCTOSAMINE 319 (H) 12/28/2021   Lab Results  Component Value Date   CHOL 124 05/09/2024   HDL 49 05/09/2024   LDLCALC 61 05/09/2024   LDLDIRECT 55.0 01/01/2018   TRIG 72 05/09/2024   CHOLHDL 2.5 05/09/2024   Lab Results  Component Value Date   MICRALBCREAT  0.9 03/21/2010   Lab Results  Component Value Date   CREATININE 1.39 (H) 05/21/2024   Lab Results  Component Value Date   GFR 40.58 (L) 08/02/2023    ASSESSMENT / PLAN  1. Type 2 diabetes mellitus with hyperglycemia, without long-term current use of insulin  (HCC)     Diabetes Mellitus type 2, complicated by peripheral neuropathy/CAD/CKD. - Diabetic status / severity: fair-controlled.  Lab Results  Component Value Date   HGBA1C 6.8 (H) 05/09/2024    - Hemoglobin A1c goal : <7%  He had taken basal insulin  Lantus  in the past when he was on dexamethasone  as a part of chemotherapy for lung cancer.  He is no longer taking insulin .  He stopped taking Trulicity  few months ago due to concern of weight loss.  He is currently taking Jardiance  and metformin  for a few months.  Will not restart Trulicity .  He lost weight.  He has reasonable control of diabetes on current regimen.  He has hemoglobin A1c 6.8% last month.   - Medications: See below. Continue metformin  500 mg 2 times a day. Continue  Jardiance  25 mg daily.  - Home glucose testing: Advised to check blood sugar in the morning fasting and bedtime.  - Discussed/ Gave Hypoglycemia treatment plan.  # Consult : not required at this time.   # Annual urine for microalbuminuria/ creatinine ratio, no microalbuminuria currently, continue ACE/ARB /valsartan .  Will check today. Last  Lab Results  Component Value Date   MICRALBCREAT 0.9 03/21/2010    # Foot check nightly / neuropathy, continue gabapentin , managed by PCP.  # Annual dilated diabetic eye exams.   - Diet: Make healthy diabetic food choices - Life style / activity / exercise: Discussed.  2. Blood pressure  -  BP Readings from Last 1 Encounters:  06/16/24 122/80    - Control is in target.  - No change in current plans.  Managed by PCP.  3. Lipid status / Hyperlipidemia - Last  Lab Results  Component Value Date   LDLCALC 61 05/09/2024   - Continue  simvastatin  20 mg daily.  Managed by PCP.  Diagnoses and all orders for this visit:  Type 2 diabetes mellitus with hyperglycemia, without long-term current use of insulin  (HCC) -     Microalbumin / creatinine urine ratio   DISPOSITION Follow up in clinic in 3 months suggested.   All questions answered and patient verbalized understanding of the plan.  [place] Klani Caridi, MD [facility] Endocrinology [facility] [address] [place], [address] [place], [place] [address] Phone # [phone]  At least part of this note was generated using voice recognition software. Inadvertent word errors may have occurred, which were not recognized during the proofreading process.

## 2024-06-17 ENCOUNTER — Ambulatory Visit: Payer: Self-pay | Admitting: Endocrinology

## 2024-06-17 LAB — MICROALBUMIN / CREATININE URINE RATIO
Creatinine, Urine: 46 mg/dL (ref 20–320)
Microalb Creat Ratio: 33 mg/g{creat} — ABNORMAL HIGH (ref <30)
Microalb, Ur: 1.5 mg/dL

## 2024-06-18 ENCOUNTER — Ambulatory Visit: Admitting: Internal Medicine

## 2024-06-18 ENCOUNTER — Other Ambulatory Visit

## 2024-06-18 ENCOUNTER — Ambulatory Visit

## 2024-06-18 ENCOUNTER — Other Ambulatory Visit: Payer: Self-pay

## 2024-06-18 NOTE — Patient Instructions (Signed)
 Visit Information  Thank you for taking time to visit with me today. Please don't hesitate to contact me if I can be of assistance to you before our next scheduled telephone appointment.  Following is a copy of your care plan:   Goals Addressed             This Visit's Progress    COMPLETED: [facility] Transitions of Care (TOC) Care Plan       Problems: (reviewed 06/18/24) Recent Hospitalization for treatment of CHF Knowledge Deficit Related to Congestive Heart Failure and Medication management barrier Patient is [facility] and [facility], cardiologist and gets confused with which ones to take.  Goal: (reviewed 06/18/24) Over the next 30 days, the patient will not experience hospital readmission  Interventions: (reviewed 06/18/24)  Heart Failure Interventions: Provided education on low sodium diet Assessed need for readable accurate scales in home Provided education about placing scale on hard, flat surface Advised patient to weigh each morning after emptying bladder Discussed importance of daily weight and advised patient to weigh and record daily Reviewed role of diuretics in prevention of fluid overload and management of heart failure; Discussed the importance of keeping all appointments with provider Oxygen  2l/min continuous Monitor O2 sats and maintain the level above 90% Monitor Blood Glucose daily 9/17 - He states his scale says low so he hasn't been weighing. Encouraged him to check the battery or purchase a new scale for daily weights 9/25 - The patient purchased a new scale - weight is 193lbs 10/2 - The patient states his weight is 198lbs. He has many different weights listed in the computer between his scales and the scales at the provider office  Patient Self Care Activities: (reviewed 06/18/24) Attend all scheduled provider appointments Call pharmacy for medication refills 3-7 days in advance of running out of medications Call provider office for new  concerns or questions  Notify RN Care Manager of [facility] call rescheduling needs Participate in Transition of Care Program/Attend TOC scheduled calls Perform all self care activities independently  Take medications as prescribed   use salt in moderation watch for swelling in feet, ankles and legs every day track symptoms and what helps feel better or worse  Plan:  Next PCP appointment scheduled for: Patient will call for appointment - 05/20/24 reviewed with the patient who will call and scheduled an appointment and include flu shot Reviewed 05/26/24 to schedule PCP appointment which he declined 06/03/24 - He received his flu shot at [place]. He will schedule with his PCP if needed Telephone follow up appointment with care management team member scheduled for: The patient has completed the 30 Day TOC Program        Patient verbalizes understanding of instructions and care plan provided today and agrees to view in MyChart. Active MyChart status and patient understanding of how to access instructions and care plan via MyChart confirmed with patient.     The patient has been provided with contact information for the care management team and has been advised to call with any health related questions or concerns.   Please call the care guide team at [phone] if you need to cancel or reschedule your appointment.   Please call the Suicide and Crisis Lifeline: 988 call the [place]  National Suicide Prevention Lifeline: (424)508-2703 or TTY: 615-566-3529 TTY 364 647 8494) to talk to a trained counselor if you are experiencing a Mental Health or Behavioral Health Crisis or need someone to talk to.  Medford Balboa, BSN, RN [employer]  [facility]  VBCI - Population Health RN Care Manager [phone]

## 2024-06-18 NOTE — Transitions of Care (Post Inpatient/ED Visit) (Signed)
 Transition of Care Week 6  Visit Note  06/18/2024  Name: Jonathan [name]. MRN: [id number]          DOB: Jul 29, 1936  Situation: Patient enrolled in [facility] 30-day program. Visit completed with Lynwood Nelwyn by telephone.   Background:   Past Medical History:  Diagnosis Date   CAD (coronary artery disease)    Two RCA stents remotely / 3rd RCA stent 2006   Colon polyps    s/p several Cscopes.   COPD (chronic obstructive pulmonary disease) (HCC)    on O2, nocturnal   Diabetes mellitus    dx aprox 2009   ED (erectile dysfunction)    has a vacumm device   Ejection fraction    Hyperlipidemia    dx in 90s   Hypertension    dx in the 72   Hypogonadism male    PVD (peripheral vascular disease)    s/p stents at LE 2009, Dr Ladona   Shortness of breath    O2 Sat dropped to 82% walking on the treadmill, September, 2012    Assessment: Patient Reported Symptoms: Cognitive  Alert and Oriented       Neurological Neurological Review of Symptoms: No symptoms reported Neurological Management Strategies: Medication therapy  HEENT HEENT Symptoms Reported: No symptoms reported      Cardiovascular Cardiovascular Symptoms Reported: No symptoms reported Does patient have uncontrolled Hypertension?: No Cardiovascular Management Strategies: Medication therapy, Weight management, Fluid modification, Routine screening Weight: 198 lb (89.8 kg)  Respiratory Respiratory Symptoms Reported: No symptoms reported Respiratory Management Strategies: Oxygen  therapy, Routine screening, Medication therapy, Weight management  Endocrine Endocrine Symptoms Reported: Unintentional weight loss Is patient diabetic?: Yes Is patient checking blood sugars at home?: Yes Endocrine Comment: Endocrinologist stopped his insulin  and placed him on oral agents only  Gastrointestinal Gastrointestinal Symptoms Reported: No symptoms reported      Genitourinary Genitourinary Symptoms Reported: No symptoms reported     Integumentary Integumentary Symptoms Reported: No symptoms reported    Musculoskeletal Musculoskelatal Symptoms Reviewed: No symptoms reported        Psychosocial Psychosocial Symptoms Reported: No symptoms reported         There were no vitals filed for this visit.  Medications Reviewed Today     Reviewed by Moises Reusing, RN (Case Manager) on 06/18/24 at 1328  Med List Status: <None>   Medication Order Taking? Sig Documenting Provider Last Dose Status Informant  albuterol  (VENTOLIN  HFA) 108 (90 Base) MCG/ACT inhaler [id number]  Inhale 2 puffs into the lungs every 6 (six) hours as needed for wheezing or shortness of breath. Neysa Reggy BIRCH, MD  Active Self, Other, Pharmacy Records, Multiple Informants  atorvastatin  (LIPITOR) 40 MG tablet [id number]  TAKE 1 TABLET BY MOUTH EVERY DAY Court Dorn PARAS, MD  Active   benzonatate  (TESSALON ) 200 MG capsule [id number]  Take 1 capsule (200 mg total) by mouth 3 (three) times daily as needed for cough. Neysa Reggy BIRCH, MD  Active Self, Other, Pharmacy Records, Multiple Informants  Blood Glucose Monitoring Suppl (FREESTYLE FREEDOM LITE) w/Device KIT [id number]  Use Freestyle Freedom Lite meter to check blood sugar twice daily. DX:E11.65 Von Pacific, MD  Active Self, Pharmacy Records, Other, Multiple Informants  clopidogrel  (PLAVIX ) 75 MG tablet [id number]  TAKE 1 TABLET BY MOUTH DAILY WITH BREAKFAST. Court Dorn PARAS, MD  Active   empagliflozin  (JARDIANCE ) 25 MG TABS tablet [id number]  Take 1 tablet (25 mg total) by mouth daily before breakfast. For diabetes E11.65 and E 11.29 Thapa, [place],  MD  Active Self, Pharmacy Records, Other, Multiple Informants  furosemide  (LASIX ) 40 MG tablet [id number]  Take 1 tablet (40 mg total) by mouth daily. West, Katlyn D, NP  Active   gabapentin  (NEURONTIN ) 300 MG capsule [id number]  Take 300 mg by mouth 2 (two) times daily. [provider]  Active Self, Other, Pharmacy Records, Multiple Informants  glucose  blood (FREESTYLE LITE) test strip [id number]  USE AS INSTRUCTED Von Pacific, MD  Active Self, Pharmacy Records, Other, Multiple Informants  hydrOXYzine  (ATARAX ) 25 MG tablet [id number]  Take 1 tablet by mouth at bedtime as needed for anxiety. [provider]  Active Self, Pharmacy Records, Other, Multiple Informants  metFORMIN  (GLUCOPHAGE ) 500 MG tablet [id number]  Take 1 tablet (500 mg total) by mouth 2 (two) times daily with a meal.  Patient taking differently: Take 500-1,000 mg by mouth 2 (two) times daily with a meal. Take one tablet in the morning and take two tablets in the evening   Thapa, [place], MD  Active Self, Pharmacy Records, Other, Multiple Informants  metoprolol  succinate (TOPROL -XL) 100 MG 24 hr tablet [id number]  TAKE 1 TABLET (100 MG TOTAL) BY MOUTH IN THE MORNING AND AT BEDTIME. TAKE WITH OR IMMEDIATELY FOLLOWING A MEAL. Court Dorn PARAS, MD  Active   Multiple Vitamin (MULTIVITAMIN WITH MINERALS) TABS tablet [id number]  Take 1 tablet by mouth daily. [provider]  Active Self, Pharmacy Records, Other, Multiple Informants  OXYGEN  [id number]  Inhale 2 L/min into the lungs continuous. [provider]  Active Self, Pharmacy Records, Other, Multiple Informants           Med Note (CANTER, LILA D   Wed Apr 29, 2024 12:56 PM)    sacubitril -valsartan  (ENTRESTO ) 24-26 MG [id number]  Take 1 tablet by mouth 2 (two) times daily. Fairy Frames, MD  Active Self, Pharmacy Records, Other, Multiple Informants  spironolactone  (ALDACTONE ) 25 MG tablet [id number]  TAKE 1/2 TABLET BY MOUTH EVERY DAY Court Dorn PARAS, MD  Active   tamsulosin  (FLOMAX ) 0.4 MG CAPS capsule [id number]  Take 1 capsule (0.4 mg total) by mouth daily after supper. Paz, Jose E, MD  Active Self, Pharmacy Records, Other, Multiple Informants  Tiotropium Bromide -Olodaterol (STIOLTO RESPIMAT ) 2.5-2.5 MCG/ACT AERS [id number]  Inhale 2 puffs into the lungs daily. Neysa Reggy BIRCH, MD  Active Self, Other, Pharmacy  Records, Multiple Informants  Med List Note Steffi Nian, CPhT 05/06/24 1403): [facility] [employer]             Recommendation:   Closure from the 30 Day TOC Program  Follow Up Plan:   Closing From:  Transitions of Care Program  Rew, Scientist, research (physical sciences), Charity fundraiser [place]  [facility] - [employer] [facility] RN Care Manager [phone]

## 2024-07-01 LAB — HM DIABETES EYE EXAM

## 2024-07-17 ENCOUNTER — Encounter: Payer: Self-pay | Admitting: Emergency Medicine

## 2024-07-17 ENCOUNTER — Ambulatory Visit: Admission: EM | Admit: 2024-07-17 | Discharge: 2024-07-17 | Disposition: A | Discharge status: Home / Self Care

## 2024-07-17 DIAGNOSIS — Z719 Counseling, unspecified: Secondary | ICD-10-CM | POA: Insufficient documentation

## 2024-07-17 DIAGNOSIS — B029 Zoster without complications: Secondary | ICD-10-CM

## 2024-07-17 DIAGNOSIS — Z23 Encounter for immunization: Secondary | ICD-10-CM | POA: Insufficient documentation

## 2024-07-17 DIAGNOSIS — Z023 Encounter for examination for recruitment to armed forces: Secondary | ICD-10-CM | POA: Insufficient documentation

## 2024-07-17 MED ORDER — VALACYCLOVIR HCL 1 G PO TABS: 1000.0000 mg | ORAL_TABLET | Freq: Three times a day (TID) | ORAL | 0 refills | Status: AC

## 2024-07-17 NOTE — ED Triage Notes (Signed)
 Pt has rash that appears to be shingles R upper back area x1 week. Does not cross midline. Has not spread from this site. Pt has had shingles vaccine.  Pt reports dull, aching pain to back. Pt has been applying sonafine wound dressing cream to area for the last several days.   Pt is on 2L O2 continuous at baseline due to lung cancer. No longer receiving cancer treatment. Transferred pt to UC oxygen  to preserve his portable unit.

## 2024-07-17 NOTE — ED Provider Notes (Signed)
 [facility]    CSN: [id number] Arrival date & time: 07/17/24  1123      History   Chief Complaint Chief Complaint  Patient presents with   Rash    HPI Jonathan [name]. is a 88 y.o. male.   Patient presents today due to erythematous eruption of right upper back and right axilla that he states has been there for about a week.  Patient states that the day before yesterday home health worker attempted to put a cream on his back and advised him that it looks like he had shingles.  Patient states that he has had Shingrix vaccine and has had shingles in the past before.  The history is provided by the patient.  Rash   Past Medical History:  Diagnosis Date   CAD (coronary artery disease)    Two RCA stents remotely / 3rd RCA stent 2006   Colon polyps    s/p several Cscopes.   COPD (chronic obstructive pulmonary disease) (HCC)    on O2, nocturnal   Diabetes mellitus    dx aprox 2009   ED (erectile dysfunction)    has a vacumm device   Ejection fraction    Hyperlipidemia    dx in 90s   Hypertension    dx in the 11   Hypogonadism male    PVD (peripheral vascular disease)    s/p stents at LE 2009, Dr Ladona   Shortness of breath    O2 Sat dropped to 82% walking on the treadmill, September, 2012    Patient Active Problem List   Diagnosis Date Noted   Need for influenza vaccination 07/17/2024   Counseling, unspecified 07/17/2024   Encounter for examination for recruitment to armed forces 07/17/2024   Acute on chronic combined systolic and diastolic congestive heart failure (HCC) 05/10/2024   Acute on chronic heart failure (HCC) 05/09/2024   CHF exacerbation (HCC) 05/07/2024   Pericardial effusion 05/06/2024   Acute on chronic respiratory failure with hypoxia (HCC) 05/06/2024   Pleural effusion 05/06/2024   COPD with acute exacerbation (HCC) 05/06/2024   Abnormal chest CT 05/06/2024   Encounter for antineoplastic immunotherapy 02/26/2024   Cough  02/26/2024   Neutropenia 12/23/2023   Acute renal failure with acute tubular necrosis superimposed on stage 3b chronic kidney disease (HCC) 11/12/2023   General weakness 11/12/2023   Postobstructive pneumonia 11/11/2023   Squamous cell carcinoma of lung (HCC) 10/23/2023   Lung nodule 10/01/2023   CAP (community acquired pneumonia) 09/23/2023   CKD stage 3b, GFR 30-44 ml/min (HCC) 09/23/2023   Lung cancer (HCC) 09/20/2023   Acute respiratory failure with hypoxia (HCC) 09/20/2023   Hemoptysis 09/09/2023   COPD mixed type (HCC)    Acute on chronic combined systolic and diastolic CHF (congestive heart failure) (HCC) 02/21/2023   CHF (congestive heart failure) (HCC) 02/21/2023   Anxiety and depression 01/31/2022   Chest pain with high risk of acute coronary syndrome    Polycythemia, secondary 08/18/2018   Dyspnea on exertion 01/24/2017   PCP NOTES >>>>>>>>>>>>>>>>>>> 04/03/2016   Coronary artery disease involving native coronary artery of native heart without angina pectoris 01/23/2016   Cramp of both lower extremities 01/23/2016   Chronic kidney disease, stage II (mild) 02/28/2014   Onychomycosis 04/10/2013   Essential tremor 10/14/2012   Annual physical exam 12/11/2011   Vitamin D  deficiency 12/11/2011   Back pain, chronic 06/18/2011   Chronic respiratory failure with hypoxia (HCC)    CAD (coronary artery disease)  PVD (peripheral vascular disease)    Headache 10/06/2010   Hypogonadism in male 06/21/2010   DMII (diabetes mellitus, type 2) (HCC) 03/17/2010   Hyperlipidemia 03/17/2010   Essential hypertension 03/17/2010    Past Surgical History:  Procedure Laterality Date   APPENDECTOMY     BRONCHIAL BIOPSY  10/01/2023   Procedure: BRONCHIAL BIOPSIES;  Surgeon: Brenna Adine CROME, DO;  Location: [facility];  Service: Pulmonary;;   BRONCHIAL BRUSHINGS  10/01/2023   Procedure: BRONCHIAL BRUSHINGS;  Surgeon: Brenna Adine CROME, DO;  Location: [facility];  Service: Pulmonary;;    BRONCHIAL WASHINGS  10/01/2023   Procedure: BRONCHIAL WASHINGS;  Surgeon: Brenna Adine CROME, DO;  Location: [facility];  Service: Pulmonary;;   CORONARY STENT INTERVENTION N/A 01/12/2022   Procedure: CORONARY STENT INTERVENTION;  Surgeon: Jordan, Peter M, MD;  Location: [facility];  Service: Cardiovascular;  Laterality: N/A;   HEMOSTASIS CONTROL  10/01/2023   Procedure: HEMOSTASIS CONTROL;  Surgeon: Brenna Adine CROME, DO;  Location: [facility];  Service: Pulmonary;;   LEFT HEART CATH AND CORONARY ANGIOGRAPHY N/A 03/13/2023   Procedure: LEFT HEART CATH AND CORONARY ANGIOGRAPHY;  Surgeon: Jordan, Peter M, MD;  Location: [facility];  Service: Cardiovascular;  Laterality: N/A;   RIGHT/LEFT HEART CATH AND CORONARY ANGIOGRAPHY N/A 01/11/2022   Procedure: RIGHT/LEFT HEART CATH AND CORONARY ANGIOGRAPHY;  Surgeon: Claudene Victory ORN, MD;  Location: [facility];  Service: Cardiovascular;  Laterality: N/A;   TONSILLECTOMY     VIDEO BRONCHOSCOPY  10/01/2023   Procedure: VIDEO BRONCHOSCOPY WITHOUT FLUORO;  Surgeon: Brenna Adine CROME, DO;  Location: [facility];  Service: Pulmonary;;       Home Medications    Prior to Admission medications   Medication Sig Start Date End Date Taking? Authorizing Provider  amoxicillin -clavulanate (AUGMENTIN ) 875-125 MG tablet Take 1 tablet by mouth 2 (two) times daily. 06/24/24  Yes [provider]  atorvastatin  (LIPITOR) 40 MG tablet TAKE 1 TABLET BY MOUTH EVERY DAY 06/09/24  Yes Court Dorn PARAS, MD  Blood Glucose Monitoring Suppl (FREESTYLE FREEDOM LITE) w/Device KIT Use Freestyle Freedom Lite meter to check blood sugar twice daily. DX:E11.65 11/17/21  Yes Von Pacific, MD  clopidogrel  (PLAVIX ) 75 MG tablet TAKE 1 TABLET BY MOUTH DAILY WITH BREAKFAST. 06/09/24  Yes Court Dorn PARAS, MD  empagliflozin  (JARDIANCE ) 25 MG TABS tablet Take 1 tablet (25 mg total) by mouth daily before breakfast. For diabetes E11.65 and E 11.29 08/01/23  Yes Thapa, [place],  MD  furosemide  (LASIX ) 40 MG tablet Take 1 tablet (40 mg total) by mouth daily. 05/22/24  Yes West, Katlyn D, NP  gabapentin  (NEURONTIN ) 300 MG capsule Take 300 mg by mouth 2 (two) times daily. 01/22/24  Yes [provider]  glucose blood (FREESTYLE LITE) test strip USE AS INSTRUCTED 11/25/22  Yes Von Pacific, MD  hydrOXYzine  (ATARAX ) 25 MG tablet Take 1 tablet by mouth at bedtime as needed for anxiety. 10/29/23  Yes [provider]  metFORMIN  (GLUCOPHAGE ) 500 MG tablet Take 1 tablet (500 mg total) by mouth 2 (two) times daily with a meal. Patient taking differently: Take 500-1,000 mg by mouth 2 (two) times daily with a meal. Take one tablet in the morning and take two tablets in the evening 08/01/23  Yes Thapa, [place], MD  metoprolol  succinate (TOPROL -XL) 100 MG 24 hr tablet TAKE 1 TABLET (100 MG TOTAL) BY MOUTH IN THE MORNING AND AT BEDTIME. TAKE WITH OR IMMEDIATELY FOLLOWING A MEAL. 06/09/24  Yes Court Dorn  J, MD  Multiple Vitamin (MULTIVITAMIN WITH MINERALS) TABS tablet Take 1 tablet by mouth daily.   Yes [provider]  OXYGEN  Inhale 2 L/min into the lungs continuous.   Yes [provider]  sacubitril -valsartan  (ENTRESTO ) 24-26 MG Take 1 tablet by mouth 2 (two) times daily. 02/22/23  Yes Fairy Frames, MD  spironolactone  (ALDACTONE ) 25 MG tablet TAKE 1/2 TABLET BY MOUTH EVERY DAY 06/09/24  Yes Court Dorn PARAS, MD  tamsulosin  (FLOMAX ) 0.4 MG CAPS capsule Take 1 capsule (0.4 mg total) by mouth daily after supper. 10/11/23  Yes Paz, Aloysius BRAVO, MD  valACYclovir  (VALTREX ) 1000 MG tablet Take 1 tablet (1,000 mg total) by mouth 3 (three) times daily for 7 days. 07/17/24 07/24/24 Yes Andra Corean BROCKS, PA-C  albuterol  (VENTOLIN  HFA) 108 (90 Base) MCG/ACT inhaler Inhale 2 puffs into the lungs every 6 (six) hours as needed for wheezing or shortness of breath. 01/28/24   Neysa Rama D, MD  benzonatate  (TESSALON ) 200 MG capsule Take 1 capsule (200 mg total) by mouth 3  (three) times daily as needed for cough. Patient not taking: Reported on 07/17/2024 04/14/24   Neysa Rama BIRCH, MD  Tiotropium Bromide -Olodaterol (STIOLTO RESPIMAT ) 2.5-2.5 MCG/ACT AERS Inhale 2 puffs into the lungs daily. Patient not taking: Reported on 07/17/2024 01/28/24   Neysa Rama BIRCH, MD    Family History Family History  Problem Relation Age of Onset   Heart disease Father    Hypertension Father    Stroke Father    Diabetes Paternal Aunt    Diabetes Maternal Grandmother    Diabetes Other        GM, nephews, many family members   Hyperlipidemia Other        ?   Prostate cancer Brother    Colon cancer Neg Hx     Social History Social History   Tobacco Use   Smoking status: Former    Current packs/day: 0.00    Average packs/day: 2.0 packs/day for 30.0 years (60.0 ttl pk-yrs)    Types: Cigarettes    Start date: 06/18/1947    Quit date: 06/17/1977    Years since quitting: 47.1   Smokeless tobacco: Never   Tobacco comments:    2 ppd, quit 1978  Vaping Use   Vaping status: Never Used  Substance Use Topics   Alcohol use: Not Currently   Drug use: No     Allergies   Patient has no known allergies.   Review of Systems Review of Systems  Skin:  Positive for rash.     Physical Exam Triage Vital Signs ED Triage Vitals  Encounter Vitals Group     BP 07/17/24 1205 111/64     Girls Systolic BP Percentile --      Girls Diastolic BP Percentile --      Boys Systolic BP Percentile --      Boys Diastolic BP Percentile --      Pulse Rate 07/17/24 1205 (!) 59     Resp 07/17/24 1205 20     Temp 07/17/24 1205 (!) 97.4 F (36.3 C)     Temp Source 07/17/24 1205 Oral     SpO2 07/17/24 1205 94 %     Weight --      Height --      Head Circumference --      Peak Flow --      Pain Score 07/17/24 1206 2     Pain Loc --      Pain Education --  Exclude from Growth Chart --    No data found.  Updated Vital Signs BP 111/64 (BP Location: Left Arm)   Pulse (!) 59    Temp (!) 97.4 F (36.3 C) (Oral)   Resp 20   SpO2 94%   Visual Acuity Right Eye Distance:   Left Eye Distance:   Bilateral Distance:    Right Eye Near:   Left Eye Near:    Bilateral Near:     Physical Exam Vitals and nursing note reviewed.  Constitutional:      General: He is not in acute distress.    Appearance: Normal appearance. He is not ill-appearing, toxic-appearing or diaphoretic.  Eyes:     General: No scleral icterus. Cardiovascular:     Rate and Rhythm: Normal rate and regular rhythm.     Heart sounds: Normal heart sounds.  Pulmonary:     Effort: Pulmonary effort is normal. No respiratory distress.     Breath sounds: Normal breath sounds. No wheezing or rhonchi.  Skin:    General: Skin is warm.     Comments: Singular eruption noted of right upper back and right axilla  Neurological:     Mental Status: He is alert and oriented to person, place, and time.  Psychiatric:        Mood and Affect: Mood normal.        Behavior: Behavior normal.         UC Treatments / Results  Labs (all labs ordered are listed, but only abnormal results are displayed) Labs Reviewed - No data to display  EKG   Radiology No results found.  Procedures Procedures (including critical care time)  Medications Ordered in UC Medications - No data to display  Initial Impression / Assessment and Plan / UC Course  I have reviewed the triage vital signs and the nursing notes.  Pertinent labs & imaging results that were available during my care of the patient were reviewed by me and considered in my medical decision making (see chart for details).     Patient was treated for shingles with valacyclovir  1 g 3 times daily for 7 days.  Patient is immunocompromised and 88 years old, for these reasons patient was treated past the 72-hour mark. Final Clinical Impressions(s) / UC Diagnoses   Final diagnoses:  Herpes zoster without complication   Discharge Instructions   None     ED Prescriptions     Medication Sig Dispense Auth. Provider   valACYclovir  (VALTREX ) 1000 MG tablet Take 1 tablet (1,000 mg total) by mouth 3 (three) times daily for 7 days. 21 tablet Andra Corean BROCKS, PA-C      PDMP not reviewed this encounter.   Andra Corean BROCKS, PA-C 07/17/24 1226

## 2024-07-19 ENCOUNTER — Encounter (HOSPITAL_COMMUNITY): Payer: Self-pay

## 2024-07-19 ENCOUNTER — Emergency Department (HOSPITAL_COMMUNITY)
Admission: EM | Admit: 2024-07-19 | Discharge: 2024-07-19 | Disposition: A | Discharge status: Home / Self Care | Attending: Emergency Medicine | Admitting: Emergency Medicine

## 2024-07-19 ENCOUNTER — Other Ambulatory Visit: Payer: Self-pay

## 2024-07-19 DIAGNOSIS — B029 Zoster without complications: Secondary | ICD-10-CM | POA: Diagnosis present

## 2024-07-19 MED ORDER — LIDOCAINE 5 % EX PTCH
1.0000 | MEDICATED_PATCH | CUTANEOUS | Status: DC
  Administered 2024-07-19: 1 via TRANSDERMAL
  Filled 2024-07-19: qty 1

## 2024-07-19 MED ORDER — OXYCODONE-ACETAMINOPHEN 5-325 MG PO TABS: 1.0000 | ORAL_TABLET | Freq: Three times a day (TID) | ORAL | 0 refills | Status: DC | PRN

## 2024-07-19 MED ORDER — GABAPENTIN 300 MG PO CAPS: 300.0000 mg | ORAL_CAPSULE | Freq: Two times a day (BID) | ORAL | 0 refills | Status: AC

## 2024-07-19 MED ORDER — LIDOCAINE 5 % EX PTCH: 1.0000 | MEDICATED_PATCH | CUTANEOUS | 0 refills | Status: AC

## 2024-07-19 MED ORDER — OXYCODONE-ACETAMINOPHEN 5-325 MG PO TABS
1.0000 | ORAL_TABLET | Freq: Once | ORAL | Status: AC
  Administered 2024-07-19: 1 via ORAL
  Filled 2024-07-19: qty 1

## 2024-07-19 NOTE — ED Triage Notes (Signed)
 Pt seen at [facility] on 10/31 and diagnosed with shingles, pt given rx for valtrex  but pt states he couldn't sleep last night due to the pain, pt is not taking any OTC medications for his pain

## 2024-07-20 NOTE — ED Provider Notes (Signed)
 [place] EMERGENCY DEPARTMENT AT [facility] Provider Note   CSN: [id number] Arrival date & time: 07/19/24  1001     Patient presents with: Herpes Zoster   Jonathan [name]. is a 88 y.o. male.   HPI Patient presents with pain in chest and back.  Diagnosed recently with shingles.  Has been given Valtrex .  Pain uncontrolled however.  Has been taking some over-the-counter medicines without pain relief.  No fevers.  Rash now on chest when it was previously on the back and axilla.    Prior to Admission medications   Medication Sig Start Date End Date Taking? Authorizing Provider  lidocaine  (LIDODERM ) 5 % Place 1 patch onto the skin daily. Remove & Discard patch within 12 hours or as directed by MD 07/19/24  Yes Patsey Lot, MD  oxyCODONE-acetaminophen  (PERCOCET/ROXICET) 5-325 MG tablet Take 1-2 tablets by mouth every 8 (eight) hours as needed for severe pain (pain score 7-10). 07/19/24  Yes Patsey Lot, MD  albuterol  (VENTOLIN  HFA) 108 [phone] Base) MCG/ACT inhaler Inhale 2 puffs into the lungs every 6 (six) hours as needed for wheezing or shortness of breath. 01/28/24   Neysa Reggy BIRCH, MD  amoxicillin -clavulanate (AUGMENTIN ) 875-125 MG tablet Take 1 tablet by mouth 2 (two) times daily. 06/24/24   [provider]  atorvastatin  (LIPITOR) 40 MG tablet TAKE 1 TABLET BY MOUTH EVERY DAY 06/09/24   Court Dorn PARAS, MD  benzonatate  (TESSALON ) 200 MG capsule Take 1 capsule (200 mg total) by mouth 3 (three) times daily as needed for cough. Patient not taking: Reported on 07/17/2024 04/14/24   Neysa Reggy D, MD  Blood Glucose Monitoring Suppl (FREESTYLE FREEDOM LITE) w/Device KIT Use Freestyle Freedom Lite meter to check blood sugar twice daily. DX:E11.65 11/17/21   Von [place], MD  clopidogrel  (PLAVIX ) 75 MG tablet TAKE 1 TABLET BY MOUTH DAILY WITH BREAKFAST. 06/09/24   Court Dorn PARAS, MD  empagliflozin  (JARDIANCE ) 25 MG TABS tablet Take 1 tablet (25 mg total) by mouth  daily before breakfast. For diabetes E11.65 and E 11.29 08/01/23   Thapa, Sudan, MD  furosemide  (LASIX ) 40 MG tablet Take 1 tablet (40 mg total) by mouth daily. 05/22/24   West, Katlyn D, NP  gabapentin  (NEURONTIN ) 300 MG capsule Take 1 capsule (300 mg total) by mouth 2 (two) times daily. 07/19/24   Patsey Lot, MD  glucose blood (FREESTYLE LITE) test strip USE AS INSTRUCTED 11/25/22   Von [place], MD  hydrOXYzine  (ATARAX ) 25 MG tablet Take 1 tablet by mouth at bedtime as needed for anxiety. 10/29/23   [provider]  metFORMIN  (GLUCOPHAGE ) 500 MG tablet Take 1 tablet (500 mg total) by mouth 2 (two) times daily with a meal. Patient taking differently: Take 500-1,000 mg by mouth 2 (two) times daily with a meal. Take one tablet in the morning and take two tablets in the evening 08/01/23   Thapa, Sudan, MD  metoprolol  succinate (TOPROL -XL) 100 MG 24 hr tablet TAKE 1 TABLET (100 MG TOTAL) BY MOUTH IN THE MORNING AND AT BEDTIME. TAKE WITH OR IMMEDIATELY FOLLOWING A MEAL. 06/09/24   Court Dorn PARAS, MD  Multiple Vitamin (MULTIVITAMIN WITH MINERALS) TABS tablet Take 1 tablet by mouth daily.    [provider]  OXYGEN  Inhale 2 L/min into the lungs continuous.    [provider]  sacubitril -valsartan  (ENTRESTO ) 24-26 MG Take 1 tablet by mouth 2 (two) times daily. 02/22/23   Fairy Frames, MD  spironolactone  (ALDACTONE ) 25 MG tablet TAKE 1/2  TABLET BY MOUTH EVERY DAY 06/09/24   Court Dorn PARAS, MD  tamsulosin  (FLOMAX ) 0.4 MG CAPS capsule Take 1 capsule (0.4 mg total) by mouth daily after supper. 10/11/23   Paz, Jose E, MD  Tiotropium Bromide -Olodaterol (STIOLTO RESPIMAT ) 2.5-2.5 MCG/ACT AERS Inhale 2 puffs into the lungs daily. Patient not taking: Reported on 07/17/2024 01/28/24   Neysa Reggy BIRCH, MD  valACYclovir  (VALTREX ) 1000 MG tablet Take 1 tablet (1,000 mg total) by mouth 3 (three) times daily for 7 days. 07/17/24 07/24/24  Andra Corean BROCKS, PA-C    Allergies: Patient  has no known allergies.    Review of Systems  Updated Vital Signs BP 132/77   Pulse 85   Temp 97.7 F (36.5 C)   Resp 18   SpO2 90%   Physical Exam Vitals and nursing note reviewed.  Skin:    Comments: Blister type rash to right back right axilla and right anterior chest.  Dermatomal distribution.  Neurological:     Mental Status: He is alert.     (all labs ordered are listed, but only abnormal results are displayed) Labs Reviewed - No data to display  EKG: None  Radiology: No results found.   Procedures   Medications Ordered in the ED  oxyCODONE-acetaminophen  (PERCOCET/ROXICET) 5-325 MG per tablet 1 tablet (1 tablet Oral Given 07/19/24 1055)                                    Medical Decision Making Risk Prescription drug management.   Patient with apparent shingles infection.  Has had shingles shot.  Is already on Valtrex .  However pain uncontrolled.  Will give pain medicines.  Will give Lidoderm  patch.  Will also give some renally dosed Neurontin .  Follow-up PCP.  Reviewing previous medicines already on Neurontin .  Will have him take consistently.     Final diagnoses:  Herpes zoster without complication    ED Discharge Orders          Ordered    oxyCODONE-acetaminophen  (PERCOCET/ROXICET) 5-325 MG tablet  Every 8 hours PRN        07/19/24 1140    lidocaine  (LIDODERM ) 5 %  Every 24 hours        07/19/24 1140    gabapentin  (NEURONTIN ) 300 MG capsule  2 times daily        07/19/24 1144               Patsey Lot, MD 07/20/24 [phone]

## 2024-07-22 ENCOUNTER — Ambulatory Visit (HOSPITAL_COMMUNITY)
Admission: RE | Admit: 2024-07-22 | Discharge: 2024-07-22 | Disposition: A | Discharge status: Home / Self Care | Source: Ambulatory Visit | Attending: Cardiology | Admitting: Cardiology

## 2024-07-22 DIAGNOSIS — I2510 Atherosclerotic heart disease of native coronary artery without angina pectoris: Secondary | ICD-10-CM

## 2024-07-22 DIAGNOSIS — I5042 Chronic combined systolic (congestive) and diastolic (congestive) heart failure: Secondary | ICD-10-CM | POA: Diagnosis not present

## 2024-07-22 DIAGNOSIS — I3139 Other pericardial effusion (noninflammatory): Secondary | ICD-10-CM

## 2024-07-22 LAB — ECHOCARDIOGRAM COMPLETE
Area-P 1/2: 2.87 cm2
P 1/2 time: 745 ms
S' Lateral: 3.4 cm

## 2024-07-29 ENCOUNTER — Ambulatory Visit: Payer: Self-pay | Admitting: Cardiology

## 2024-07-29 NOTE — Progress Notes (Signed)
 HPI male former smoker followed for COPD, chronic respiratory failure with hypoxia, complicated by CAD/MI/ Stents/ PAD, DM2, polycythemia PFT- PFT 08/13/18- Diffusion moderately reduced. Normal spirometry flows and lung volumes10/23/12-  Normal spirometry flows with small- airway response to bronchodilator, normal lung volumes and moderately reduced diffusion. FEV1/FVC 0.79, DLCO 58% -06/18/12- 96%, 88%, 94% 449 M. Significant desaturation with exercise. Overnight Oximetry 02/07/17-room air-sustained desaturation less than or equal to 88% for over 7 hours Walk Test 2019-  Walk Test POC qualifying 05/06/23- Did not qualify for portable O2. Lowest saturation was 90% on room air. Walk Test POC Qualifying- 04/14/24- started 96% at rest on room air. After 1 lap O2 sat 87%, HR 96. O2 2L pulse he was able to walk 1 more lap O2 sat 91%, HR 101. He wasn't strong enough to walk a third lap. ------------------------------------------------------------------------------------------------------------   04/14/24-  88 year old male former smoker (60 pk yr) followed for COPD, chronic respiratory failure with Hypoxia,, complicated by CAD/MI/ stents/ PAD,CHF, H DM2, PVD,  stage IIIa (T3, N2, M0) non-small cell Lung cancer, squamous cell carcinoma presented with left upper lobe lung mass with suspicious mediastinal lymphadenopathy diagnosed in January 2025 / ChemoRadiation,  O2 2 L/ APS-sleep  -Albuterol  hfa, Stiolto 2.5,  He has to help with [name] at home who has dementia. Walk Test POC Qualifying- 04/14/24- started 96% at rest on room air. After 1 lap O2 sat 87%, HR 96. O2 2L pulse he was able to walk 1 more lap O2 sat 91%, HR 101. He wasn't strong enough to walk a third lap. He brings brochures -interested in [employer] POC. Discussed the use of AI scribe software for clinical note transcription with the patient, who gave verbal consent to proceed.  History of Present Illness   Jonathan [name]. is an 88 year old  male with lung cancer who presents for follow-up regarding Jonathan respiratory symptoms and oxygen  therapy.  He experiences a non-productive cough and uses cough syrup at home, though its effectiveness is uncertain. He uses two inhalers, Stiolto and albuterol , and does not need refills. Oxygen  therapy is used at night, and he has a portable unit for home use, which is heavy and cumbersome. Oxygen  saturation drops to the 80s during ambulation and at night but remains above the 70s. No significant changes in breathing or sputum production are noted.   CXR 02/26/24- IMPRESSION: No active cardiopulmonary disease. Emphysema. Decreased left upper lobe/hilar mass compared to prior radiograph.    Assessment and Plan:    Cough Intermittent cough without significant respiratory changes. Previous Tessalon  Perles use not recalled. - Prescribed stronger Tessalon  Perles (benzonatate  200 mg) up to three times daily as needed. - [facility] [address].  Chronic obstructive pulmonary disease requiring inhalers and supplemental oxygen  COPD managed with Stiolto and albuterol  inhalers. Uses supplemental oxygen  during sleep and while seated. Oxygen  desaturation to mid-80s during exertion. Discussed portable oxygen  concentrator options and insurance coverage. - Evaluate oxygen  levels during ambulation for portable oxygen  concentrator need. - Discuss portable oxygen  concentrator options with insurance coverage considerations.   Chronic Respiratory Failure with Hypoxia Walk Test POC qualified today for POC portable O2 Has home O2 2L for sleep from APS POC 2L pulse for exertion- APS or Jonathan provider of choice   Actively managed bronchogenic carcinoma with ongoing infusion therapy Managed by Oncology/ Radiation Oncology Completed radiation and main chemotherapy. Receiving monthly infusion therapy.     07/30/24- 88 year old male former smoker (60 pk yr) followed  for COPD, chronic respiratory  failure with Hypoxia,, complicated by CAD/MI/ stents/ PAD,CHF, H DM2, PVD,  stage IIIa (T3, N2, M0) non-small cell Lung cancer, squamous cell carcinoma presented with left upper lobe lung mass with suspicious mediastinal lymphadenopathy diagnosed in January 2025 / ChemoRadiation, Immunotherapy cycles O2 3 L/ APS/ [employer]-sleep and POC -Albuterol  hfa, Stiolto 2.5,  He has to help with [name] at home who has dementia. He had considered moving to [place]  to be closer to family, but decided to stay here. Interval Shingle-Right lateral thorax. -----Doing well.  Having problems with POC shutting off. Discussed the use of AI scribe software for clinical note transcription with the patient, who gave verbal consent to proceed.  History of Present Illness   The patient is an 88 year old with COPD, lung cancer and chronic respiratory failure who presents for follow-up.  He experiences increased dyspnea, similar to previous episodes. A history of pericardial effusion noted during the summer contributed to Jonathan shortness of breath. He uses a portable oxygen  concentrator, which occasionally cuts off unexpectedly, and has two home concentrators. He inquires about the need for continuous oxygen  use and confirms inhaler- Stiolto and albuterol  use twice daily. He is under oncology care for squamous cell carcinoma of the lung.     CT chest 05/06/24     Pericardial effuion   not noted on ECHO Nov, 2025 IMPRESSION: 1. Large and substantially increased pericardial effusion compared     <<< to 01/16/2024. Correlate with physical exam indicators of tamponade in deciding whether echocardiographic workup is indicated. 2. Moderate left and small right pleural effusions with passive  <<< atelectasis. 3. Hazy airspace opacity in the aerated portion of the left lower lobe suspicious for pneumonia or pneumonitis. 4. Residuum of the left upper lobe mass measures 4.0 by 1.5 cm, <<< previously 4.5 by 1.9 cm on 01/16/2024. 5. Airway  plugging of the apicoposterior segment left upper lobe. 6. Stable 0.6 cm right upper lobe subpleural nodule. 7. Stable left adrenal nodule, indeterminate for malignancy versus benign etiology (although chronicity dating back to 05/03/2010 tends to favor benign etiology). 8. Aortic Atherosclerosis (ICD10-I70.0) and Emphysema (ICD10-J43.9).   Assessment and Plan:    Chronic obstructive pulmonary disease (COPD) and chronic respiratory failure with hypoxia COPD with chronic respiratory failure and hypoxia, uses portable oxygen  concentrator during exertion. Portable oxygen  concentrator occasionally malfunctions. - Contacted service provider for portable oxygen  concentrator issues. - Continue home oxygen  for sleep and as needed - Refilled albuterol  prescription.   NSCCA Lung Continues under care of Oncology      ROS-see HPI  + = positive Constitutional:   No-   weight loss, night sweats, fevers, chills, fatigue, lassitude. HEENT:   No-  headaches, difficulty swallowing, tooth/dental problems, sore throat,       No-  sneezing, itching, ear ache, nasal congestion, post nasal drip,  CV:  No-   chest pain, orthopnea, PND, swelling in lower extremities, anasarca, dizziness, palpitations,                 Claudication R thigh Resp:   + shortness of breath with exertion or at rest.              No-   productive cough,  No non-productive cough,  No- coughing up of blood.              No-   change in color of mucus.   wheezing.   Skin: No-   rash or lesions. GI:  No-   heartburn, indigestion, abdominal pain, nausea, vomiting, GU:  MS:  No-   joint pain or swelling.   Neuro-     nothing unusual Psych:  No- change in mood or affect. No depression or anxiety.  No memory loss.  OBJ- Physical Exam    O2 3L pulse POC General- Alert, Oriented, Affect-appropriate, Distress- none acute.,  Skin- rash-none, lesions- none, excoriation- none Lymphadenopathy-  Head- atraumatic            Eyes- Gross vision  intact, PERRLA, conjunctivae and secretions clear            Ears- Hearing, canals-normal            Nose-  no-Septal dev, mucus, polyps, erosion, perforation             Throat- Mallampati II , mucosa clear , drainage- none, tonsils- atrophic Neck- flexible , trachea midline, no stridor , thyroid  nl, carotid no bruit Chest - symmetrical excursion , unlabored           Heart/CV- RRR/rapid , no murmur , no gallop  , no rub, nl s1 s2                           - JVD none , edema- none, stasis changes- none, varices- none           Lung- +diminished, but audible airflow both bases, cough-none , dullness-none, rub- none,            Chest wall-  Abd-  Br/ Gen/ Rectal- Not done, not indicated Extrem- cyanosis- none,  atrophy- none, strength- nl  Neuro- grossly intact to observation

## 2024-07-30 ENCOUNTER — Ambulatory Visit: Admitting: Internal Medicine

## 2024-07-30 ENCOUNTER — Encounter: Payer: Self-pay | Admitting: Internal Medicine

## 2024-07-30 VITALS — BP 138/68 | HR 69 | Temp 98.1°F | Ht 74.0 in | Wt 208.6 lb

## 2024-07-30 DIAGNOSIS — C3490 Malignant neoplasm of unspecified part of unspecified bronchus or lung: Secondary | ICD-10-CM

## 2024-07-30 DIAGNOSIS — J449 Chronic obstructive pulmonary disease, unspecified: Secondary | ICD-10-CM

## 2024-07-30 DIAGNOSIS — Z87891 Personal history of nicotine dependence: Secondary | ICD-10-CM

## 2024-07-30 DIAGNOSIS — J9611 Chronic respiratory failure with hypoxia: Secondary | ICD-10-CM

## 2024-07-30 DIAGNOSIS — C3412 Malignant neoplasm of upper lobe, left bronchus or lung: Secondary | ICD-10-CM

## 2024-07-30 MED ORDER — ALBUTEROL SULFATE HFA 108 (90 BASE) MCG/ACT IN AERS: 2.0000 | INHALATION_SPRAY | Freq: Four times a day (QID) | RESPIRATORY_TRACT | 3 refills | Status: AC | PRN

## 2024-07-30 MED ORDER — STIOLTO RESPIMAT 2.5-2.5 MCG/ACT IN AERS
2.0000 | INHALATION_SPRAY | Freq: Every day | RESPIRATORY_TRACT | 4 refills | Status: AC
Start: 2024-07-30 — End: ?

## 2024-07-30 NOTE — Patient Instructions (Addendum)
 Please call if we can help  Use your oxygen  for sleep and your portable if you are doing more than just activity around the house.  You can call the manufacturer of your portable oxygen  for advice - what do do if it keeps cutting off.    [facility]

## 2024-08-03 ENCOUNTER — Encounter: Payer: Self-pay | Admitting: Internal Medicine

## 2024-08-17 ENCOUNTER — Other Ambulatory Visit

## 2024-08-17 ENCOUNTER — Ambulatory Visit (HOSPITAL_COMMUNITY)
Admission: RE | Admit: 2024-08-17 | Discharge: 2024-08-17 | Disposition: A | Source: Ambulatory Visit | Attending: Physician Assistant

## 2024-08-17 ENCOUNTER — Inpatient Hospital Stay: Attending: Internal Medicine

## 2024-08-17 DIAGNOSIS — Z923 Personal history of irradiation: Secondary | ICD-10-CM | POA: Insufficient documentation

## 2024-08-17 DIAGNOSIS — C3412 Malignant neoplasm of upper lobe, left bronchus or lung: Secondary | ICD-10-CM | POA: Diagnosis present

## 2024-08-17 DIAGNOSIS — J90 Pleural effusion, not elsewhere classified: Secondary | ICD-10-CM | POA: Diagnosis not present

## 2024-08-17 DIAGNOSIS — Z9221 Personal history of antineoplastic chemotherapy: Secondary | ICD-10-CM | POA: Diagnosis not present

## 2024-08-17 DIAGNOSIS — I700 Atherosclerosis of aorta: Secondary | ICD-10-CM | POA: Diagnosis not present

## 2024-08-17 LAB — CMP (CANCER CENTER ONLY)
ALT: 9 U/L (ref 0–44)
AST: 24 U/L (ref 15–41)
Albumin: 3.9 g/dL (ref 3.5–5.0)
Alkaline Phosphatase: 75 U/L (ref 38–126)
Anion gap: 11 (ref 5–15)
BUN: 30 mg/dL — ABNORMAL HIGH (ref 8–23)
CO2: 25 mmol/L (ref 22–32)
Calcium: 9.6 mg/dL (ref 8.9–10.3)
Chloride: 104 mmol/L (ref 98–111)
Creatinine: 1.45 mg/dL — ABNORMAL HIGH (ref 0.61–1.24)
GFR, Estimated: 47 mL/min — ABNORMAL LOW (ref >60)
Glucose, Bld: 83 mg/dL (ref 70–99)
Potassium: 5.2 mmol/L — ABNORMAL HIGH (ref 3.5–5.1)
Sodium: 139 mmol/L (ref 135–145)
Total Bilirubin: 0.6 mg/dL (ref 0.0–1.2)
Total Protein: 7.4 g/dL (ref 6.5–8.1)

## 2024-08-17 LAB — CBC WITH DIFFERENTIAL (CANCER CENTER ONLY)
Abs Immature Granulocytes: 0.02 K/uL (ref 0.00–0.07)
Basophils Absolute: 0.1 K/uL (ref 0.0–0.1)
Basophils Relative: 1 %
Eosinophils Absolute: 0.2 K/uL (ref 0.0–0.5)
Eosinophils Relative: 3 %
HCT: 48.1 % (ref 39.0–52.0)
Hemoglobin: 16.2 g/dL (ref 13.0–17.0)
Immature Granulocytes: 0 %
Lymphocytes Relative: 28 %
Lymphs Abs: 2 K/uL (ref 0.7–4.0)
MCH: 32.8 pg (ref 26.0–34.0)
MCHC: 33.7 g/dL (ref 30.0–36.0)
MCV: 97.4 fL (ref 80.0–100.0)
Monocytes Absolute: 0.9 K/uL (ref 0.1–1.0)
Monocytes Relative: 12 %
Neutro Abs: 4.1 K/uL (ref 1.7–7.7)
Neutrophils Relative %: 56 %
Platelet Count: 185 K/uL (ref 150–400)
RBC: 4.94 MIL/uL (ref 4.22–5.81)
RDW: 16.7 % — ABNORMAL HIGH (ref 11.5–15.5)
WBC Count: 7.4 K/uL (ref 4.0–10.5)
nRBC: 0 % (ref 0.0–0.2)

## 2024-08-17 MED ORDER — IOHEXOL 300 MG/ML  SOLN
75.0000 mL | Freq: Once | INTRAMUSCULAR | Status: AC | PRN
  Administered 2024-08-17: 75 mL via INTRAVENOUS

## 2024-08-17 MED ORDER — SODIUM CHLORIDE (PF) 0.9 % IJ SOLN
INTRAMUSCULAR | Status: AC
  Filled 2024-08-17: qty 50

## 2024-08-24 ENCOUNTER — Ambulatory Visit: Admitting: Internal Medicine

## 2024-08-24 ENCOUNTER — Inpatient Hospital Stay: Admitting: Internal Medicine

## 2024-08-24 VITALS — BP 138/71 | HR 77 | Temp 97.6°F | Resp 17 | Ht 74.0 in | Wt 207.0 lb

## 2024-08-24 DIAGNOSIS — C3490 Malignant neoplasm of unspecified part of unspecified bronchus or lung: Secondary | ICD-10-CM | POA: Diagnosis not present

## 2024-08-24 DIAGNOSIS — C3412 Malignant neoplasm of upper lobe, left bronchus or lung: Secondary | ICD-10-CM | POA: Diagnosis not present

## 2024-08-24 NOTE — Progress Notes (Signed)
 [facility] [phone]   Fax:([phone]) [phone]  OFFICE PROGRESS NOTE  Jonathan Aloysius BRAVO, MD [address] [place] [address]  DIAGNOSIS: stage IIIa (T3, N2, M0) non-small cell lung cancer, squamous cell carcinoma presented with left upper lobe lung mass with suspicious mediastinal lymphadenopathy diagnosed in January 2025   PRIOR THERAPY:  1) Concurrent chemoradiation with weekly carboplatin  for AUC of 2 and paclitaxel  45 Mg/M2.  Status post 7 cycles.  Last dose was given 12/23/2023. 2) Consolidation immunotherapy with Imfinzi  1500 Mg IV every 4 weeks.  First dose 01/28/2024.  Status post 4 cycles.  CURRENT THERAPY: Observation  INTERVAL HISTORY: Jonathan [name] is a 88 years old African-American male returns to the clinic today for follow-up visit.Discussed the use of AI scribe software for clinical note transcription with the patient, who gave verbal consent to proceed.  History of Present Illness Jonathan [name]. is an 88 year old male with stage III lung cancer who presents for evaluation and discussion of recent chest CT scan results.  He experiences intermittent wheezing, with periods of feeling well and other times not as well. He uses supplemental oxygen  at a rate of two liters, primarily at night and as needed during the day.  He is currently using two inhalers: a rescue inhaler taken as two puffs in the morning, and another inhaler used as needed.  A recent CT scan of the chest was performed to monitor his condition.     MEDICAL HISTORY: Past Medical History:  Diagnosis Date   CAD (coronary artery disease)    Two RCA stents remotely / 3rd RCA stent 2006   Colon polyps    s/p several Cscopes.   COPD (chronic obstructive pulmonary disease) (HCC)    on O2, nocturnal   Diabetes mellitus    dx aprox 2009   ED (erectile dysfunction)    has a vacumm device   Ejection fraction    Hyperlipidemia    dx in 90s   Hypertension    dx  in the 41   Hypogonadism male    PVD (peripheral vascular disease)    s/p stents at LE 2009, Dr Ladona   Shortness of breath    O2 Sat dropped to 82% walking on the treadmill, September, 2012    ALLERGIES:  has no known allergies.  MEDICATIONS:  Current Outpatient Medications  Medication Sig Dispense Refill   albuterol  (VENTOLIN  HFA) 108 (90 Base) MCG/ACT inhaler Inhale 2 puffs into the lungs every 6 (six) hours as needed for wheezing or shortness of breath. 54 g 3   atorvastatin  (LIPITOR) 40 MG tablet TAKE 1 TABLET BY MOUTH EVERY DAY 90 tablet 3   Blood Glucose Monitoring Suppl (FREESTYLE FREEDOM LITE) w/Device KIT Use Freestyle Freedom Lite meter to check blood sugar twice daily. DX:E11.65 1 kit 0   clopidogrel  (PLAVIX ) 75 MG tablet TAKE 1 TABLET BY MOUTH DAILY WITH BREAKFAST. 90 tablet 3   empagliflozin  (JARDIANCE ) 25 MG TABS tablet Take 1 tablet (25 mg total) by mouth daily before breakfast. For diabetes E11.65 and E 11.29 90 tablet 3   furosemide  (LASIX ) 40 MG tablet Take 1 tablet (40 mg total) by mouth daily. 90 tablet 3   gabapentin  (NEURONTIN ) 300 MG capsule Take 1 capsule (300 mg total) by mouth 2 (two) times daily. 60 capsule 0   glucose blood (FREESTYLE LITE) test strip USE AS INSTRUCTED 100 strip 11   hydrOXYzine  (ATARAX ) 25  MG tablet Take 1 tablet by mouth at bedtime as needed for anxiety.     lidocaine  (LIDODERM ) 5 % Place 1 patch onto the skin daily. Remove & Discard patch within 12 hours or as directed by MD 30 patch 0   metFORMIN  (GLUCOPHAGE ) 500 MG tablet Take 1 tablet (500 mg total) by mouth 2 (two) times daily with a meal. (Patient taking differently: Take 500-1,000 mg by mouth 2 (two) times daily with a meal. Take one tablet in the morning and take two tablets in the evening) 180 tablet 3   metoprolol  succinate (TOPROL -XL) 100 MG 24 hr tablet TAKE 1 TABLET (100 MG TOTAL) BY MOUTH IN THE MORNING AND AT BEDTIME. TAKE WITH OR IMMEDIATELY FOLLOWING A MEAL. 180 tablet 3    Multiple Vitamin (MULTIVITAMIN WITH MINERALS) TABS tablet Take 1 tablet by mouth daily.     OXYGEN  Inhale 2 L/min into the lungs continuous.     sacubitril -valsartan  (ENTRESTO ) 24-26 MG Take 1 tablet by mouth 2 (two) times daily. 60 tablet 0   spironolactone  (ALDACTONE ) 25 MG tablet TAKE 1/2 TABLET BY MOUTH EVERY DAY 45 tablet 3   tamsulosin  (FLOMAX ) 0.4 MG CAPS capsule Take 1 capsule (0.4 mg total) by mouth daily after supper. 90 capsule 1   Tiotropium Bromide -Olodaterol (STIOLTO RESPIMAT ) 2.5-2.5 MCG/ACT AERS Inhale 2 puffs into the lungs daily. 12 g 4   No current facility-administered medications for this visit.    SURGICAL HISTORY:  Past Surgical History:  Procedure Laterality Date   APPENDECTOMY     BRONCHIAL BIOPSY  10/01/2023   Procedure: BRONCHIAL BIOPSIES;  Surgeon: Brenna Adine CROME, DO;  Location: [facility];  Service: Pulmonary;;   BRONCHIAL BRUSHINGS  10/01/2023   Procedure: BRONCHIAL BRUSHINGS;  Surgeon: Brenna Adine CROME, DO;  Location: [facility];  Service: Pulmonary;;   BRONCHIAL WASHINGS  10/01/2023   Procedure: BRONCHIAL WASHINGS;  Surgeon: Brenna Adine CROME, DO;  Location: [facility];  Service: Pulmonary;;   CORONARY STENT INTERVENTION N/A 01/12/2022   Procedure: CORONARY STENT INTERVENTION;  Surgeon: Jordan, Peter M, MD;  Location: [facility];  Service: Cardiovascular;  Laterality: N/A;   HEMOSTASIS CONTROL  10/01/2023   Procedure: HEMOSTASIS CONTROL;  Surgeon: Brenna Adine CROME, DO;  Location: [facility];  Service: Pulmonary;;   LEFT HEART CATH AND CORONARY ANGIOGRAPHY N/A 03/13/2023   Procedure: LEFT HEART CATH AND CORONARY ANGIOGRAPHY;  Surgeon: Jordan, Peter M, MD;  Location: [facility];  Service: Cardiovascular;  Laterality: N/A;   RIGHT/LEFT HEART CATH AND CORONARY ANGIOGRAPHY N/A 01/11/2022   Procedure: RIGHT/LEFT HEART CATH AND CORONARY ANGIOGRAPHY;  Surgeon: Claudene Victory ORN, MD;  Location: [facility];  Service: Cardiovascular;  Laterality:  N/A;   TONSILLECTOMY     VIDEO BRONCHOSCOPY  10/01/2023   Procedure: VIDEO BRONCHOSCOPY WITHOUT FLUORO;  Surgeon: Brenna Adine CROME, DO;  Location: [facility];  Service: Pulmonary;;    REVIEW OF SYSTEMS:  Constitutional: positive for fatigue Eyes: negative Ears, nose, mouth, throat, and face: negative Respiratory: positive for dyspnea on exertion Cardiovascular: negative Gastrointestinal: negative Genitourinary:negative Integument/breast: negative Hematologic/lymphatic: negative Musculoskeletal:negative Neurological: negative Behavioral/Psych: negative Endocrine: negative Allergic/Immunologic: negative   PHYSICAL EXAMINATION: General appearance: alert, cooperative, fatigued, and no distress Head: Normocephalic, without obvious abnormality, atraumatic Neck: no adenopathy, no JVD, supple, symmetrical, trachea midline, and thyroid  not enlarged, symmetric, no tenderness/mass/nodules Lymph nodes: Cervical, supraclavicular, and axillary nodes normal. Resp: clear to auscultation bilaterally Back: symmetric, no curvature. ROM normal. No CVA tenderness. Cardio: regular rate and  rhythm, S1, S2 normal, no murmur, click, rub or gallop GI: soft, non-tender; bowel sounds normal; no masses,  no organomegaly Extremities: extremities normal, atraumatic, no cyanosis or edema Neurologic: Alert and oriented X 3, normal strength and tone. Normal symmetric reflexes. Normal coordination and gait  ECOG PERFORMANCE STATUS: 1 - Symptomatic but completely ambulatory  Blood pressure 138/71, pulse 77, temperature 97.6 F (36.4 C), temperature source Temporal, resp. rate 17, height 6' 2 (1.88 m), weight 207 lb (93.9 kg), SpO2 93%.  LABORATORY DATA: Lab Results  Component Value Date   WBC 7.4 08/17/2024   HGB 16.2 08/17/2024   HCT 48.1 08/17/2024   MCV 97.4 08/17/2024   PLT 185 08/17/2024      Chemistry      Component Value Date/Time   NA 139 08/17/2024 1410   NA 144 01/01/2024 0000   K 5.2 (H)  08/17/2024 1410   CL 104 08/17/2024 1410   CO2 25 08/17/2024 1410   BUN 30 (H) 08/17/2024 1410   BUN 14 01/01/2024 0000   CREATININE 1.45 (H) 08/17/2024 1410   CREATININE 1.04 11/28/2023 0803   GLU 87 01/01/2024 0000      Component Value Date/Time   CALCIUM  9.6 08/17/2024 1410   ALKPHOS 75 08/17/2024 1410   AST 24 08/17/2024 1410   ALT 9 08/17/2024 1410   BILITOT 0.6 08/17/2024 1410       RADIOGRAPHIC STUDIES: CT Chest W Contrast Result Date: 08/21/2024 CLINICAL DATA:  Squamous cell carcinoma of the left upper lobe status post chemoradiation, now on immunotherapy * Tracking Code: BO * EXAM: CT CHEST WITH CONTRAST TECHNIQUE: Multidetector CT imaging of the chest was performed during intravenous contrast administration. RADIATION DOSE REDUCTION: This exam was performed according to the departmental dose-optimization program which includes automated exposure control, adjustment of the mA and/or kV according to patient size and/or use of iterative reconstruction technique. CONTRAST:  75mL OMNIPAQUE  IOHEXOL  300 MG/ML  SOLN COMPARISON:  CT chest dated 05/06/2024 and priors FINDINGS: Cardiovascular: Normal heart size. Decreased trace pericardial effusion. Great vessels are normal in course and caliber. No central pulmonary emboli. Coronary artery calcifications and aortic atherosclerosis. Mediastinum/Nodes: Imaged thyroid  gland without nodules meeting criteria for imaging follow-up by size. Normal esophagus. No pathologically enlarged axillary, supraclavicular, mediastinal, or hilar lymph nodes. Lungs/Pleura: The central airways are patent. Moderate upper lobe predominant centrilobular and paraseptal emphysema. Interval increased volume loss and architectural distortion in the superior segment left lower lobe, where there was hazy opacity previously. Continued decrease in size of treated medial left upper lobe lesion measuring 2.8 x 1.1 cm (5:85), previously 4.0 x 1.5 cm. Unchanged 6 x 5 mm  perifissural right upper lobe nodule (5:82). Additional scattered right lower lobe nodules (5:88, 103, 111) are unchanged from at least 01/16/2024. No pneumothorax. Decreased residual trace left pleural effusion. Resolution of right pleural effusion. Upper abdomen: Unchanged 2.1 cm left adrenal nodule (2:161). No right adrenal nodule. Partially imaged medial right renal upper pole simple cyst measures 2.4 cm. No specific follow-up imaging recommended. Musculoskeletal: No acute or abnormal lytic or blastic osseous lesions. IMPRESSION: 1. Continued decrease in size of treated medial left upper lobe lesion. 2. Interval increased volume loss and architectural distortion in the superior segment left lower lobe, where there was hazy opacity previously, likely evolving posttreatment changes. 3. Unchanged pulmonary nodules. 4. Decreased residual trace left pleural effusion. Resolution of right pleural effusion. 5. Unchanged 2.1 cm left adrenal nodule. 6. Aortic Atherosclerosis (ICD10-I70.0) and Emphysema (ICD10-J43.9). Coronary artery calcifications.  Assessment for potential risk factor modification, dietary therapy or pharmacologic therapy may be warranted, if clinically indicated. Electronically Signed   By: Limin  Xu M.D.   On: 08/21/2024 14:10     ASSESSMENT AND PLAN: This is a very pleasant 88 years old African-American male with stage IIIa (T3, N2, M0) non-small cell lung cancer, squamous cell carcinoma presented with left upper lobe lung mass with suspicious mediastinal lymphadenopathy diagnosed in January 2025  He underwent a course of concurrent chemoradiation with weekly carboplatin  for AUC of 2 and paclitaxel  45 Mg/M2 status post 7 cycles.  Last dose was given 12/23/2023. He is currently undergoing consolidation treatment with immunotherapy with Imfinzi  1500 Mg IV every 4 weeks status post 4 cycles.  His treatment was discontinued secondary to worsening congestive heart failure of unclear etiology.  He is  currently on observation. Assessment and Plan Assessment & Plan Stage III malignant neoplasm of left upper lobe of lung under surveillance The mass in the left upper lobe has decreased in size from 4.0 x 1.5 cm to 2.8 x 1.1 cm, indicating a positive response to previous treatment. Immune therapy was discontinued due to concerns about exacerbating congestive heart failure. The mass is currently under surveillance with no evidence of growth or spread. - Continue surveillance with repeat CT scan in three months  Congestive heart failure of unknown etiology Congestive heart failure is of unknown etiology. Immune therapy was discontinued due to concerns about its potential contribution to heart failure. - Continue monitoring heart failure symptoms  Wheezing Intermittent wheezing persists. He uses two inhalers: one as a rescue inhaler and another as needed. Oxygen  therapy is used at 2 liters per minute, both during the day and at night. - Continue current inhaler regimen - Continue oxygen  therapy as needed The patient was advised to call immediately if he has any concerning symptoms in the interval.  The patient voices understanding of current disease status and treatment options and is in agreement with the current care plan.  All questions were answered. The patient knows to call the clinic with any problems, questions or concerns. We can certainly see the patient much sooner if necessary.  The total time spent in the appointment was 30 minutes including review of chart and various tests results, discussions about plan of care and coordination of care plan .   Disclaimer: This note was dictated with voice recognition software. Similar sounding words can inadvertently be transcribed and may not be corrected upon review.

## 2024-08-26 ENCOUNTER — Telehealth: Payer: Self-pay

## 2024-08-26 NOTE — Telephone Encounter (Signed)
LMOM for Beth asking for call back.

## 2024-08-26 NOTE — Telephone Encounter (Signed)
 Copied from CRM #[id number]. Topic: Clinical - Medication Question >> Aug 26, 2024  9:35 AM Deaijah H wrote: Reason for CRM: Beth nurse w/ [facility] called to have nurse give a call to verify patients medications. Call back [phone]

## 2024-08-27 ENCOUNTER — Telehealth: Payer: Self-pay

## 2024-08-27 NOTE — Telephone Encounter (Signed)
 It is okay to take Synjardy (continue Jardiance  25 mg and metformin  1000 mg) 1 tablet daily.  If he takes Synjardy combined pill he does not need to take metformin  and Jardiance  separately.

## 2024-08-27 NOTE — Telephone Encounter (Signed)
 Home health nurse called to clarify instructions for patient's Jardiance . Per nurse patient has several instructions. Nurse stated that [facility] Synjardy (metformin  1,000-jardiance  25mg ) however nurse wanted to make sure it was ok with MD

## 2024-08-27 NOTE — Telephone Encounter (Signed)
 Spoke w/ Beth- questions regarding metformin  and jardiance  doses, [facility] taking something [facility]. I verified what we have listed on his med list, telephone number to [facility] given to [place].

## 2024-09-01 ENCOUNTER — Ambulatory Visit: Admitting: Internal Medicine

## 2024-09-16 ENCOUNTER — Ambulatory Visit: Admitting: Endocrinology

## 2024-09-16 ENCOUNTER — Encounter: Payer: Self-pay | Admitting: Endocrinology

## 2024-09-16 ENCOUNTER — Ambulatory Visit: Payer: Self-pay | Admitting: Endocrinology

## 2024-09-16 VITALS — BP 130/60 | HR 72 | Resp 16 | Ht 74.0 in | Wt 211.4 lb

## 2024-09-16 DIAGNOSIS — E1165 Type 2 diabetes mellitus with hyperglycemia: Secondary | ICD-10-CM

## 2024-09-16 DIAGNOSIS — Z7984 Long term (current) use of oral hypoglycemic drugs: Secondary | ICD-10-CM | POA: Diagnosis not present

## 2024-09-16 LAB — POCT GLYCOSYLATED HEMOGLOBIN (HGB A1C): Hemoglobin A1C: 6.8 % — AB (ref 4.0–5.6)

## 2024-09-16 MED ORDER — SYNJARDY XR 25-1000 MG PO TB24: 1.0000 | ORAL_TABLET | Freq: Every day | ORAL | 3 refills | Status: AC

## 2024-09-16 NOTE — Progress Notes (Signed)
 "  Outpatient Endocrinology Note Jonathan Pesta, MD  09/16/2024  Patient's Name: Jonathan [name].    DOB: [birth year]    MRN: [id number]                                                    REASON OF VISIT: Follow up of type 2 diabetes mellitus  PCP: [name], MD  HISTORY OF PRESENT ILLNESS:   Jonathan Reiling. is a 88 y.o. old male with past medical history listed below, is here for follow up for type 2 diabetes mellitus.   Pertinent Diabetes History: Patient was diagnosed with type 2 diabetes mellitus in 2009.  Patient has relatively controlled type 2 diabetes mellitus.  Chronic Diabetes Complications : Retinopathy: no. Last ophthalmology exam was done on annually, reportedly, following with ophthalmology regularly.  Nephropathy: CKD III, on ACE/ARB /valsartan  Peripheral neuropathy: yes, on gabapentin . Has PAD. Coronary artery disease: yes.  He has CHF. Stroke: no  Relevant comorbidities and cardiovascular risk factors: Obesity: no Body mass index is 27.14 kg/m.  Hypertension: Yes  Hyperlipidemia : Yes, on statin   Current / Home Diabetic regimen includes:  Synjardy XR 1000-25 mg daily.  Prior diabetic medications: Used to be on Trulicity  in the past, concerned about weight loss.  He was on basal insulin  in the past when he was on dexamethasone .  Glycemic data:   Glucometer data reviewed from meter : 130, 181, 191, 197, 137, 158, 152, 138, 150.  Hypoglycemia: Patient has no hypoglycemic episodes. Patient has hypoglycemia awareness.  Factors modifying glucose control: 1.  Diabetic diet assessment: 3 meals a day.  2.  Staying active or exercising: No formal exercise per physically active at home take care of his demented wife.  3.  Medication compliance: compliant all of the time.  # HYPOGONADISM He had decreased libido and erectile dysfunction previously and was found to have hypogonadism, probably in 2011.He did have a slightly low free testosterone  level in 2012  on treatment. Prolactin level normal.  Previously on AndroGel . With his hemoglobin going up to 19.1 in August 2019 he has been told not to take any testosterone . He was given clomiphene  as a trial but he did not try this. Testosterone  level is persistently low without testosterone  therapy although only mildly decreased now. He has had some fatigue overall but not significant now. Because of his history of erythrocytosis will not start any supplement. He has been followed by hematologist for erythrocytosis.  Note : He was diagnosed with lung cancer in January 2025, following with oncology and was on chemotherapy and radiation therapy.  He received dexamethasone  with chemotherapy.  Interval history  He has been taking Synjardy, getting medication from the [place] [facility].  Hemoglobin A1c today 6.8%.  Glucometer data as reviewed above, mostly acceptable blood sugar.  He has numbness and tingling to feet and has neuropathy.  No other complaints today.   REVIEW OF SYSTEMS As per history of present illness.   PAST MEDICAL HISTORY: Past Medical History:  Diagnosis Date   CAD (coronary artery disease)    Two RCA stents remotely / 3rd RCA stent 2006   Colon polyps    s/p several Cscopes.   COPD (chronic obstructive pulmonary disease) (HCC)    on O2, nocturnal   Diabetes mellitus    dx aprox 2009  ED (erectile dysfunction)    has a vacumm device   Ejection fraction    Hyperlipidemia    dx in 90s   Hypertension    dx in the 52   Hypogonadism male    PVD (peripheral vascular disease)    s/p stents at LE 2009, Dr Ladona   Shortness of breath    O2 Sat dropped to 82% walking on the treadmill, September, 2012    PAST SURGICAL HISTORY: Past Surgical History:  Procedure Laterality Date   APPENDECTOMY     BRONCHIAL BIOPSY  10/01/2023   Procedure: BRONCHIAL BIOPSIES;  Surgeon: Brenna Adine CROME, DO;  [facility]: [facility];  Service: Pulmonary;;   BRONCHIAL BRUSHINGS  10/01/2023   Procedure:  BRONCHIAL BRUSHINGS;  Surgeon: Brenna Adine CROME, DO;  [facility]: [facility];  Service: Pulmonary;;   BRONCHIAL WASHINGS  10/01/2023   Procedure: BRONCHIAL WASHINGS;  Surgeon: Brenna Adine CROME, DO;  [facility]: [facility];  Service: Pulmonary;;   CORONARY STENT INTERVENTION N/A 01/12/2022   Procedure: CORONARY STENT INTERVENTION;  Surgeon: Jordan, Peter M, MD;  [facility];  Service: Cardiovascular;  Laterality: N/A;   HEMOSTASIS CONTROL  10/01/2023   Procedure: HEMOSTASIS CONTROL;  Surgeon: Brenna Adine CROME, DO;  [facility];  Service: Pulmonary;;   LEFT HEART CATH AND CORONARY ANGIOGRAPHY N/A 03/13/2023   Procedure: LEFT HEART CATH AND CORONARY ANGIOGRAPHY;  Surgeon: Jordan, Peter M, MD;  Location: [facility] INVASIVE CV LAB;  Service: Cardiovascular;  Laterality: N/A;   RIGHT/LEFT HEART CATH AND CORONARY ANGIOGRAPHY N/A 01/11/2022   Procedure: RIGHT/LEFT HEART CATH AND CORONARY ANGIOGRAPHY;  Surgeon: Claudene Victory ORN, MD;  [facility];  Service: Cardiovascular;  Laterality: N/A;   TONSILLECTOMY     VIDEO BRONCHOSCOPY  10/01/2023   Procedure: VIDEO BRONCHOSCOPY WITHOUT FLUORO;  Surgeon: Brenna Adine CROME, DO;  [facility];  Service: Pulmonary;;    ALLERGIES: No Known Allergies  FAMILY HISTORY:  Family History  Problem Relation Age of Onset   Heart disease Father    Hypertension Father    Stroke Father    Diabetes Paternal Aunt    Diabetes Maternal Grandmother    Diabetes Other        GM, nephews, many family members   Hyperlipidemia Other        ?   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   Tobacco Use   Smoking status: Former     Current packs/day: 0.00    Average packs/day: 2.0 packs/day for 30.0 years (60.0 ttl pk-yrs)    Types: Cigarettes    Start date: 06/18/1947    Quit date: 06/17/1977    Years since quitting: 47.2   Smokeless tobacco: Never   Tobacco comments:    2 ppd, quit 1978  Vaping Use   Vaping status: Never Used  Substance and Sexual Activity   Alcohol use: Not Currently   Drug use: No   Sexual activity: Yes  Other Topics Concern   Not on file  Social History Narrative   4 children (lost 1 son)   Wife has 2 children  Social Drivers of Health   Tobacco Use: Medium Risk (09/16/2024)   Patient History    Smoking Tobacco Use: Former    Smokeless Tobacco Use: Never    Passive Exposure: Not on file  Financial Resource Strain: Low Risk (12/17/2023)   Overall Financial Resource Strain (CARDIA)    Difficulty of Paying Living Expenses: Not hard at all  Food Insecurity: No Food Insecurity (05/11/2024)   Epic    Worried About Radiation Protection Practitioner of Food in the Last Year: Never true    Ran Out of Food in the Last Year: Never true  Transportation Needs: No Transportation Needs (05/11/2024)   Epic    Lack of Transportation (Medical): No    Lack of Transportation (Non-Medical): No  Physical Activity: Inactive (12/17/2023)   Exercise Vital Sign    Days of Exercise per Week: 0 days    Minutes of Exercise per Session: 0 min  Stress: No Stress Concern Present (12/17/2023)   [employer] of Occupational Health - Occupational Stress Questionnaire    Feeling of Stress : Not at all  Social Connections: Moderately Isolated (05/09/2024)   Social Connection and Isolation Panel    Frequency of Communication with Friends and Family: More than three times a week    Frequency of Social Gatherings with Friends and Family: Once a week    Attends Religious Services: Never    Database Administrator or Organizations: No    Attends Banker Meetings: Never    Marital Status: Married  Depression  (PHQ2-9): Low Risk (05/11/2024)   Depression (PHQ2-9)    PHQ-2 Score: 1  Alcohol Screen: Low Risk (12/17/2023)   Alcohol Screen    Last Alcohol Screening Score (AUDIT): 0  Housing: Low Risk (05/11/2024)   Epic    Unable to Pay for Housing in the Last Year: No    Number of Times Moved in the Last Year: 0    Homeless in the Last Year: No  Utilities: Not At Risk (05/11/2024)   Epic    Threatened with loss of utilities: No  Health Literacy: Adequate Health Literacy (12/17/2023)   B1300 Health Literacy    Frequency of need for help with medical instructions: Never    MEDICATIONS:  Current Outpatient Medications  Medication Sig Dispense Refill   albuterol  (VENTOLIN  HFA) 108 (90 Base) MCG/ACT inhaler Inhale 2 puffs into the lungs every 6 (six) hours as needed for wheezing or shortness of breath. 54 g 3   atorvastatin  (LIPITOR) 40 MG tablet TAKE 1 TABLET BY MOUTH EVERY DAY 90 tablet 3   Blood Glucose Monitoring Suppl (FREESTYLE FREEDOM LITE) w/Device KIT Use Freestyle Freedom Lite meter to check blood sugar twice daily. DX:E11.65 1 kit 0   clopidogrel  (PLAVIX ) 75 MG tablet TAKE 1 TABLET BY MOUTH DAILY WITH BREAKFAST. 90 tablet 3   Empagliflozin -metFORMIN  HCl ER (SYNJARDY XR) 25-1000 MG TB24 Take 1 tablet by mouth daily. 90 tablet 3   Empagliflozin -metFORMIN  HCl ER 25-1000 MG TB24 Take by mouth.     furosemide  (LASIX ) 40 MG tablet Take 1 tablet (40 mg total) by mouth daily. 90 tablet 3   gabapentin  (NEURONTIN ) 300 MG capsule Take 1 capsule (300 mg total) by mouth 2 (two) times daily. 60 capsule 0   glucose blood (FREESTYLE LITE) test strip USE AS INSTRUCTED 100 strip 11   hydrOXYzine  (ATARAX ) 25 MG tablet Take 1 tablet by mouth at bedtime as needed for anxiety.     lidocaine  (LIDODERM ) 5 % Place 1  patch onto the skin daily. Remove & Discard patch within 12 hours or as directed by MD 30 patch 0   metoprolol  succinate (TOPROL -XL) 100 MG 24 hr tablet TAKE 1 TABLET (100 MG TOTAL) BY MOUTH IN THE MORNING  AND AT BEDTIME. TAKE WITH OR IMMEDIATELY FOLLOWING A MEAL. 180 tablet 3   Multiple Vitamin (MULTIVITAMIN WITH MINERALS) TABS tablet Take 1 tablet by mouth daily.     OXYGEN  Inhale 2 L/min into the lungs continuous.     sacubitril -valsartan  (ENTRESTO ) 24-26 MG Take 1 tablet by mouth 2 (two) times daily. 60 tablet 0   spironolactone  (ALDACTONE ) 25 MG tablet TAKE 1/2 TABLET BY MOUTH EVERY DAY 45 tablet 3   tamsulosin  (FLOMAX ) 0.4 MG CAPS capsule Take 1 capsule (0.4 mg total) by mouth daily after supper. 90 capsule 1   Tiotropium Bromide -Olodaterol (STIOLTO RESPIMAT ) 2.5-2.5 MCG/ACT AERS Inhale 2 puffs into the lungs daily. 12 g 4   No current facility-administered medications for this visit.    PHYSICAL EXAM: Vitals:   09/16/24 1428  BP: 130/60  Pulse: 72  Resp: 16  SpO2: 92%  Weight: 211 lb 6.4 oz (95.9 kg)  Height: 6' 2 (1.88 m)  PF: (!) 2 L/min     Body mass index is 27.14 kg/m.  Wt Readings from Last 3 Encounters:  09/16/24 211 lb 6.4 oz (95.9 kg)  08/24/24 207 lb (93.9 kg)  07/30/24 208 lb 9.6 oz (94.6 kg)    General: Well developed, well nourished male in no apparent distress.  HEENT: AT/[place], no external lesions.  Eyes: Conjunctiva clear and no icterus. Neck: Neck supple  Lungs: Respirations not labored.  On nasal cannula oxygen . Neurologic: Alert, oriented, normal speech Extremities / Skin: Dry.   Psychiatric: Does not appear depressed or anxious  Diabetic Foot Exam - Simple   Simple Foot Form Diabetic Foot exam was performed with the following findings: Yes 09/16/2024  2:31 PM  Visual Inspection See comments: Yes Sensation Testing Intact to touch and monofilament testing bilaterally: Yes Pulse Check See comments: Yes Comments Hammer toes deformity R> L. No ulcer. Monofilament exam absent bilaterally. DP palpable bilaterally.     LABS Reviewed Lab Results  Component Value Date   HGBA1C 6.8 (A) 09/16/2024   HGBA1C 6.8 (H) 05/09/2024   HGBA1C 7.4 01/01/2024    Lab Results  Component Value Date   FRUCTOSAMINE 324 (H) 11/28/2023   FRUCTOSAMINE 250 04/26/2023   FRUCTOSAMINE 319 (H) 12/28/2021   Lab Results  Component Value Date   CHOL 124 05/09/2024   HDL 49 05/09/2024   LDLCALC 61 05/09/2024   LDLDIRECT 55.0 01/01/2018   TRIG 72 05/09/2024   CHOLHDL 2.5 05/09/2024   Lab Results  Component Value Date   MICRALBCREAT 33 (H) 06/16/2024   MICRALBCREAT 0.9 03/21/2010   Lab Results  Component Value Date   CREATININE 1.45 (H) 08/17/2024   Lab Results  Component Value Date   GFR 40.58 (L) 08/02/2023    ASSESSMENT / PLAN  1. Type 2 diabetes mellitus with hyperglycemia, without long-term current use of insulin  (HCC)      Diabetes Mellitus type 2, complicated by peripheral neuropathy/CAD/CKD. - Diabetic status / severity: fair-controlled.  Lab Results  Component Value Date   HGBA1C 6.8 (A) 09/16/2024    - Hemoglobin A1c goal : <7%  He had taken basal insulin  Lantus  in the past when he was on dexamethasone  as a part of chemotherapy for lung cancer.  He is no longer taking insulin .  -  Medications: See below. Continue Synjardy extended release 1000-25 mg 1 tablet daily.  - Home glucose testing: Advised to check blood sugar few times a week at different times of the day.  - Discussed/ Gave Hypoglycemia treatment plan.  # Consult : not required at this time.   # Annual urine for microalbuminuria/ creatinine ratio, mild microalbuminuria currently, continue ACE/ARB /valsartan .  On SGLT2 inhibitor. Last  Lab Results  Component Value Date   MICRALBCREAT 33 (H) 06/16/2024    # Foot check nightly / neuropathy, continue gabapentin , managed by PCP.  # Annual dilated diabetic eye exams.   - Diet: Make healthy diabetic food choices - Life style / activity / exercise: Discussed.  2. Blood pressure  -  BP Readings from Last 1 Encounters:  09/16/24 130/60    - Control is in target.  - No change in current plans.  Managed by  PCP.  3. Lipid status / Hyperlipidemia - Last  Lab Results  Component Value Date   LDLCALC 61 05/09/2024   - Continue simvastatin  20 mg daily.  Managed by PCP.  Diagnoses and all orders for this visit:  Type 2 diabetes mellitus with hyperglycemia, without long-term current use of insulin  (HCC) -     Empagliflozin -metFORMIN  HCl ER (SYNJARDY XR) 25-1000 MG TB24; Take 1 tablet by mouth daily. -     POCT glycosylated hemoglobin (Hb A1C)    DISPOSITION Follow up in clinic in 3 months suggested.   All questions answered and patient verbalized understanding of the plan.  Eula Mazzola, MD [facility] Endocrinology [facility] [address] [place], [address] [place], [place] [address] Phone # [phone]  At least part of this note was generated using voice recognition software. Inadvertent word errors may have occurred, which were not recognized during the proofreading process. "

## 2024-09-18 ENCOUNTER — Telehealth: Payer: Self-pay

## 2024-09-18 ENCOUNTER — Other Ambulatory Visit: Payer: Self-pay

## 2024-09-18 NOTE — Telephone Encounter (Signed)
"   Received from [facility] pharmacy stating patient's medication that was ordered during office visit needs [facility] due to [facility] for [facility]. Patient called and VM left requesting patient make office aware of where he would like this sent.  "

## 2024-09-29 ENCOUNTER — Encounter (HOSPITAL_COMMUNITY): Payer: Self-pay

## 2024-10-06 ENCOUNTER — Encounter: Payer: Self-pay | Admitting: Internal Medicine

## 2024-10-06 ENCOUNTER — Ambulatory Visit: Admitting: Internal Medicine

## 2024-10-06 VITALS — BP 138/84 | HR 80 | Temp 98.0°F | Resp 20 | Ht 74.0 in | Wt 213.5 lb

## 2024-10-06 DIAGNOSIS — E1122 Type 2 diabetes mellitus with diabetic chronic kidney disease: Secondary | ICD-10-CM | POA: Diagnosis not present

## 2024-10-06 DIAGNOSIS — N1832 Chronic kidney disease, stage 3b: Secondary | ICD-10-CM | POA: Diagnosis not present

## 2024-10-06 DIAGNOSIS — J9611 Chronic respiratory failure with hypoxia: Secondary | ICD-10-CM | POA: Diagnosis not present

## 2024-10-06 DIAGNOSIS — N1831 Chronic kidney disease, stage 3a: Secondary | ICD-10-CM

## 2024-10-06 NOTE — Patient Instructions (Signed)
 Please read your instructions carefully.    Go to the front desk for the checkout Please make an appointment for a checkup in 3 to 4 months   You have a trigger finger: Get over-the-counter Voltaren gel and apply 3-4 times a day as needed for pain and discomfort   If you have not had a COVID-vaccine in the last 5 months please get vaccine at the pharmacy  Continue checking your blood pressure regularly Blood pressure goal:  between 110/65 and  130/80. If it is consistently higher or lower, let me know

## 2024-10-06 NOTE — Progress Notes (Unsigned)
 "  Subjective:    Patient ID: Jonathan [name]., male    DOB: 1936/09/06, 89 y.o.   MRN: [id number]  DOS:  10/06/2024 Follow-up  Discussed the use of AI scribe software for clinical note transcription with the patient, who gave verbal consent to proceed.  History of Present Illness Jonathan [name]. is an 89 year old male who presents with a painful trigger finger.  Right fourth finger pain and triggering - Intermittent pain and locking of the right fourth finger - Finger closes and requires active effort to pop open - Discomfort occurs when finger is released from locked position  Genitourinary symptoms - Penile issue present - Plans to see urologist for further evaluation - No additional details provided  Oral bleeding - Small amount of blood in saliva after brushing teeth - Attributes bleeding to mouth rather than pulmonary source  Cardiopulmonary symptoms - No chest pain - No shortness of breath - No leg swelling    Review of Systems See above   Past Medical History:  Diagnosis Date   CAD (coronary artery disease)    Two RCA stents remotely / 3rd RCA stent 2006   Colon polyps    s/p several Cscopes.   COPD (chronic obstructive pulmonary disease) (HCC)    on O2, nocturnal   Diabetes mellitus    dx aprox 2009   ED (erectile dysfunction)    has a vacumm device   Ejection fraction    Hyperlipidemia    dx in 90s   Hypertension    dx in the 78   Hypogonadism male    PVD (peripheral vascular disease)    s/p stents at LE 2009, Dr Ladona   Shortness of breath    O2 Sat dropped to 82% walking on the treadmill, September, 2012    Past Surgical History:  Procedure Laterality Date   APPENDECTOMY     BRONCHIAL BIOPSY  10/01/2023   Procedure: BRONCHIAL BIOPSIES;  Surgeon: Brenna Adine CROME, DO;  [facility];  Service: Pulmonary;;   BRONCHIAL BRUSHINGS  10/01/2023   Procedure: BRONCHIAL BRUSHINGS;  Surgeon: Brenna Adine CROME, DO;  [facility];   Service: Pulmonary;;   BRONCHIAL WASHINGS  10/01/2023   Procedure: BRONCHIAL WASHINGS;  Surgeon: Brenna Adine CROME, DO;  [facility];  Service: Pulmonary;;   CORONARY STENT INTERVENTION N/A 01/12/2022   Procedure: CORONARY STENT INTERVENTION;  Surgeon: Jordan, Peter M, MD;  [facility];  Service: Cardiovascular;  Laterality: N/A;   HEMOSTASIS CONTROL  10/01/2023   Procedure: HEMOSTASIS CONTROL;  Surgeon: Brenna Adine CROME, DO;  [facility];  Service: Pulmonary;;   LEFT HEART CATH AND CORONARY ANGIOGRAPHY N/A 03/13/2023   Procedure: LEFT HEART CATH AND CORONARY ANGIOGRAPHY;  Surgeon: Jordan, Peter M, MD;  Location: [facility] INVASIVE CV LAB;  Service: Cardiovascular;  Laterality: N/A;   RIGHT/LEFT HEART CATH AND CORONARY ANGIOGRAPHY N/A 01/11/2022   Procedure: RIGHT/LEFT HEART CATH AND CORONARY ANGIOGRAPHY;  Surgeon: Claudene Victory ORN, MD;  [facility];  Service: Cardiovascular;  Laterality: N/A;   TONSILLECTOMY     VIDEO BRONCHOSCOPY  10/01/2023   Procedure: VIDEO BRONCHOSCOPY WITHOUT FLUORO;  Surgeon: Brenna Adine CROME, DO;  [facility];  Service: Pulmonary;;    Current Outpatient Medications  Medication Instructions   albuterol  (VENTOLIN  HFA) 108 (90 Base) MCG/ACT inhaler 2 puffs, Inhalation, Every 6 hours PRN   atorvastatin  (LIPITOR) 40 mg, Oral, Daily   Blood Glucose Monitoring Suppl (FREESTYLE  FREEDOM LITE) w/Device KIT Use Freestyle Freedom Lite meter to check blood sugar twice daily. DX:E11.65   clopidogrel  (PLAVIX ) 75 mg, Oral, Daily with breakfast   Empagliflozin -metFORMIN  HCl ER (SYNJARDY  XR) 25-1000 MG TB24 1 tablet, Oral, Daily   furosemide  (LASIX ) 40 mg, Oral, Daily   gabapentin  (NEURONTIN ) 300 mg, Oral, 2 times daily   glucose blood (FREESTYLE LITE) test strip USE AS INSTRUCTED   hydrOXYzine  (ATARAX ) 25 MG tablet 1 tablet, At bedtime PRN   lidocaine  (LIDODERM ) 5 % 1 patch, Transdermal, Every 24 hours, Remove & Discard patch  within 12 hours or as directed by MD   metoprolol  succinate (TOPROL -XL) 100 mg, Oral, 2 times daily, Take with or immediately following a meal.   Multiple Vitamin (MULTIVITAMIN WITH MINERALS) TABS tablet 1 tablet, Daily   OXYGEN  2 L/min, Continuous   sacubitril -valsartan  (ENTRESTO ) 24-26 MG 1 tablet, Oral, 2 times daily   spironolactone  (ALDACTONE ) 12.5 mg, Oral, Daily   tamsulosin  (FLOMAX ) 0.4 mg, Oral, Daily after supper   Tiotropium Bromide -Olodaterol (STIOLTO RESPIMAT ) 2.5-2.5 MCG/ACT AERS 2 puffs, Inhalation, Daily       Objective:   Physical Exam BP 138/84   Pulse 80   Temp 98 F (36.7 C) (Oral)   Resp 20   Ht 6' 2 (1.88 m)   Wt 213 lb 8 oz (96.8 kg)   SpO2 91% Comment: 2L/min  BMI 27.41 kg/m  General:   Well developed, NAD, BMI noted. HEENT:  Normocephalic . Face symmetric, atraumatic Lungs:  Slightly decreased breath sounds, few end expiratory wheezes Normal respiratory effort, no intercostal retractions, no accessory muscle use. Heart: RRR,  no murmur.  Lower extremities: no pretibial edema bilaterally  Skin: Not pale. Not jaundice Neurologic:  alert & oriented X3.  Speech normal, gait appropriate for age and unassisted Psych--  Cognition and judgment appear intact.  Cooperative with normal attention span and concentration.  Behavior appropriate. No anxious or depressed appearing.      Assessment   Assessment DM dx ~2009, complicated by CAD, PVD, mild DM neuropathy.  Per Endo hypogonadism.  Per Endo HTN Hyperlipidemia Pulmonary: --COPD, nocturnal O2 prn --Non-small cell -SCC lung cancer (LUL) Dx 09-2023: Chemotherapy, radiation therapy. Back pain, chronic: See visit 10/12/2016 CV: -CAD  S/p  stenting 2 to the RCA remote past and the third RCA stent in  2006, negative stress test in 2012, LVEF 55% on echo 2015 -PVD- stents lower extremity 2009  -02-2023: Chronic systolic and diastolic CHF newly recognized . Cath: Nonobstructive CAD Polycythemia: Saw  hematology 11-2018, felt to be due to chronic respiratory failure.  Not likely polycythemia vera   DM: Last A1c 6.8, LOV with endocrinology December 31.  Dx w/ shingles November 2025: Resolved  Lung cancer: LOV oncology December 8, immunotherapy discontinued due to concerns of possible CHF exacerbation, currently under surveillance.  No hemoptysis.  Chronic respiratory failure with hypoxia: Last visit with pulmonary November 13, he has his oxygen  with him today, no respiratory distress, no recent hemoptysis.  Continue present care  CAD, heart failure, saw cardiology 05/22/2024, they ordered echocardiogram: EF improved to 50 to 55%.  Some aortic valve regurgitation.  Was referred to the heart failure clinic.  At this point he seems stable with no edema. Trigger finger 4th finger `right hand Intermittent pain and locking of the fourth finger, consistent with trigger finger. - Recommended Voltaren gel application to the affected finger three to four times daily. - Advised referral to a specialist for possible injection if symptoms persist  or worsen. Vaccine advice: Had a flu shot, RSV, recommend a COVID-vaccine if not done lately "

## 2024-10-07 NOTE — Assessment & Plan Note (Addendum)
 DM with CKD GFR 47: Last A1c 6.8, LOV with endocrinology December 31. Dx w/ shingles November 2025: Resolved Lung cancer: LOV oncology December 8, immunotherapy discontinued due to concerns of possible CHF exacerbation, currently under surveillance.  No hemoptysis. Chronic respiratory failure with hypoxia: Last visit with pulmonary November 13, he has his oxygen  with him today, no respiratory distress, no recent hemoptysis.  Continue present care CAD, heart failure, saw cardiology 05/22/2024, they ordered echocardiogram: EF improved to 50 to 55%.  Some aortic valve regurgitation.  Was referred to the heart failure clinic.  At this point he seems stable with no edema. Trigger finger 4th finger `right hand Intermittent pain and locking of the fourth finger, consistent with trigger finger. - Recommended Voltaren gel application to the affected finger three to four times daily. - Advised referral to a specialist for possible injection if symptoms persist or worsen. Vaccine advice: Had a flu shot, RSV, recommend a COVID-vaccine if not done lately RTC 3 to 4 months

## 2024-11-23 ENCOUNTER — Inpatient Hospital Stay

## 2024-11-23 ENCOUNTER — Inpatient Hospital Stay: Admitting: Internal Medicine

## 2024-12-01 ENCOUNTER — Encounter: Admitting: Pulmonary Disease

## 2024-12-22 ENCOUNTER — Ambulatory Visit

## 2024-12-24 ENCOUNTER — Ambulatory Visit: Admitting: Endocrinology

## 2025-02-03 ENCOUNTER — Ambulatory Visit: Admitting: Internal Medicine
